# Patient Record
Sex: Female | Born: 1935
Health system: Southern US, Community
[De-identification: ages and names within clinical notes are randomized; demographics above are authoritative.]

## PROBLEM LIST (undated history)

## (undated) DIAGNOSIS — Z8719 Personal history of other diseases of the digestive system: Secondary | ICD-10-CM

## (undated) DIAGNOSIS — F419 Anxiety disorder, unspecified: Secondary | ICD-10-CM

## (undated) DIAGNOSIS — F329 Major depressive disorder, single episode, unspecified: Secondary | ICD-10-CM

## (undated) DIAGNOSIS — R0600 Dyspnea, unspecified: Secondary | ICD-10-CM

## (undated) DIAGNOSIS — Z8619 Personal history of other infectious and parasitic diseases: Secondary | ICD-10-CM

## (undated) DIAGNOSIS — IMO0002 Reserved for concepts with insufficient information to code with codable children: Secondary | ICD-10-CM

## (undated) DIAGNOSIS — I4891 Unspecified atrial fibrillation: Secondary | ICD-10-CM

## (undated) DIAGNOSIS — A498 Other bacterial infections of unspecified site: Secondary | ICD-10-CM

## (undated) DIAGNOSIS — G4730 Sleep apnea, unspecified: Secondary | ICD-10-CM

## (undated) DIAGNOSIS — N183 Chronic kidney disease, stage 3 (moderate): Secondary | ICD-10-CM

## (undated) DIAGNOSIS — T7840XA Allergy, unspecified, initial encounter: Secondary | ICD-10-CM

## (undated) DIAGNOSIS — K6389 Other specified diseases of intestine: Secondary | ICD-10-CM

## (undated) DIAGNOSIS — I499 Cardiac arrhythmia, unspecified: Secondary | ICD-10-CM

## (undated) DIAGNOSIS — I10 Essential (primary) hypertension: Secondary | ICD-10-CM

## (undated) DIAGNOSIS — J189 Pneumonia, unspecified organism: Secondary | ICD-10-CM

## (undated) DIAGNOSIS — E785 Hyperlipidemia, unspecified: Secondary | ICD-10-CM

## (undated) DIAGNOSIS — H409 Unspecified glaucoma: Secondary | ICD-10-CM

## (undated) DIAGNOSIS — I6529 Occlusion and stenosis of unspecified carotid artery: Secondary | ICD-10-CM

## (undated) DIAGNOSIS — K5790 Diverticulosis of intestine, part unspecified, without perforation or abscess without bleeding: Secondary | ICD-10-CM

## (undated) DIAGNOSIS — S02401A Maxillary fracture, unspecified, initial encounter for closed fracture: Secondary | ICD-10-CM

## (undated) DIAGNOSIS — K219 Gastro-esophageal reflux disease without esophagitis: Secondary | ICD-10-CM

## (undated) DIAGNOSIS — K589 Irritable bowel syndrome without diarrhea: Secondary | ICD-10-CM

## (undated) DIAGNOSIS — C541 Malignant neoplasm of endometrium: Secondary | ICD-10-CM

## (undated) DIAGNOSIS — F32A Depression, unspecified: Secondary | ICD-10-CM

## (undated) DIAGNOSIS — Z8489 Family history of other specified conditions: Secondary | ICD-10-CM

## (undated) DIAGNOSIS — R011 Cardiac murmur, unspecified: Secondary | ICD-10-CM

## (undated) DIAGNOSIS — E039 Hypothyroidism, unspecified: Secondary | ICD-10-CM

## (undated) DIAGNOSIS — C55 Malignant neoplasm of uterus, part unspecified: Secondary | ICD-10-CM

## (undated) DIAGNOSIS — M1120 Other chondrocalcinosis, unspecified site: Secondary | ICD-10-CM

## (undated) DIAGNOSIS — M1990 Unspecified osteoarthritis, unspecified site: Secondary | ICD-10-CM

## (undated) HISTORY — DX: Major depressive disorder, single episode, unspecified: F32.9

## (undated) HISTORY — DX: Depression, unspecified: F32.A

## (undated) HISTORY — PX: OTHER SURGICAL HISTORY: SHX169

## (undated) HISTORY — DX: Maxillary fracture, unspecified side, initial encounter for closed fracture: S02.401A

## (undated) HISTORY — DX: Occlusion and stenosis of unspecified carotid artery: I65.29

## (undated) HISTORY — PX: DG KNEE 1-2 VIEWS BILAT: HXRAD248

## (undated) HISTORY — DX: Unspecified glaucoma: H40.9

## (undated) HISTORY — DX: Diverticulosis of intestine, part unspecified, without perforation or abscess without bleeding: K57.90

## (undated) HISTORY — PX: COLONOSCOPY: SHX174

## (undated) HISTORY — DX: Cardiac arrhythmia, unspecified: I49.9

## (undated) HISTORY — DX: Other specified diseases of intestine: K63.89

## (undated) HISTORY — DX: Anxiety disorder, unspecified: F41.9

## (undated) HISTORY — PX: ESOPHAGOGASTRODUODENOSCOPY: SHX1529

## (undated) HISTORY — DX: Reserved for concepts with insufficient information to code with codable children: IMO0002

## (undated) HISTORY — DX: Pneumonia, unspecified organism: J18.9

## (undated) HISTORY — DX: Irritable bowel syndrome without diarrhea: K58.9

## (undated) HISTORY — DX: Essential (primary) hypertension: I10

## (undated) HISTORY — DX: Other chondrocalcinosis, unspecified site: M11.20

## (undated) HISTORY — DX: Hyperlipidemia, unspecified: E78.5

## (undated) HISTORY — DX: Chronic kidney disease, stage 3 (moderate): N18.3

## (undated) HISTORY — DX: Allergy, unspecified, initial encounter: T78.40XA

## (undated) HISTORY — DX: Unspecified osteoarthritis, unspecified site: M19.90

## (undated) HISTORY — DX: Cardiac murmur, unspecified: R01.1

## (undated) HISTORY — DX: Personal history of other infectious and parasitic diseases: Z86.19

## (undated) HISTORY — PX: TONSILLECTOMY: SUR1361

## (undated) HISTORY — PX: JOINT REPLACEMENT: SHX530

## (undated) HISTORY — DX: Malignant neoplasm of uterus, part unspecified: C55

---

## 1997-04-26 HISTORY — PX: OTHER SURGICAL HISTORY: SHX169

## 1998-02-28 ENCOUNTER — Inpatient Hospital Stay (HOSPITAL_COMMUNITY): Admission: EM | Admit: 1998-02-28 | Discharge: 1998-03-03 | Payer: Self-pay | Admitting: Emergency Medicine

## 1998-02-28 ENCOUNTER — Encounter: Payer: Self-pay | Admitting: Emergency Medicine

## 1998-03-11 ENCOUNTER — Encounter: Payer: Self-pay | Admitting: Gastroenterology

## 1998-03-11 ENCOUNTER — Ambulatory Visit (HOSPITAL_COMMUNITY): Admission: RE | Admit: 1998-03-11 | Discharge: 1998-03-11 | Payer: Self-pay | Admitting: Gastroenterology

## 1999-11-05 ENCOUNTER — Other Ambulatory Visit: Admission: RE | Admit: 1999-11-05 | Discharge: 1999-11-05 | Payer: Self-pay | Admitting: Internal Medicine

## 1999-11-12 ENCOUNTER — Encounter: Admission: RE | Admit: 1999-11-12 | Discharge: 1999-11-12 | Payer: Self-pay | Admitting: Internal Medicine

## 1999-11-12 ENCOUNTER — Encounter: Payer: Self-pay | Admitting: Internal Medicine

## 2000-10-31 ENCOUNTER — Other Ambulatory Visit: Admission: RE | Admit: 2000-10-31 | Discharge: 2000-10-31 | Payer: Self-pay | Admitting: Internal Medicine

## 2000-11-30 ENCOUNTER — Encounter: Payer: Self-pay | Admitting: Internal Medicine

## 2000-11-30 ENCOUNTER — Encounter: Admission: RE | Admit: 2000-11-30 | Discharge: 2000-11-30 | Payer: Self-pay | Admitting: Internal Medicine

## 2001-11-06 ENCOUNTER — Other Ambulatory Visit: Admission: RE | Admit: 2001-11-06 | Discharge: 2001-11-06 | Payer: Self-pay | Admitting: Internal Medicine

## 2001-12-01 ENCOUNTER — Encounter: Admission: RE | Admit: 2001-12-01 | Discharge: 2001-12-01 | Payer: Self-pay | Admitting: Internal Medicine

## 2001-12-01 ENCOUNTER — Encounter: Payer: Self-pay | Admitting: Internal Medicine

## 2002-07-23 ENCOUNTER — Encounter: Payer: Self-pay | Admitting: Gastroenterology

## 2002-07-23 ENCOUNTER — Encounter: Admission: RE | Admit: 2002-07-23 | Discharge: 2002-07-23 | Payer: Self-pay | Admitting: Gastroenterology

## 2002-07-25 ENCOUNTER — Encounter: Payer: Self-pay | Admitting: Gastroenterology

## 2002-07-25 ENCOUNTER — Encounter: Admission: RE | Admit: 2002-07-25 | Discharge: 2002-07-25 | Payer: Self-pay | Admitting: Gastroenterology

## 2003-01-01 ENCOUNTER — Encounter: Admission: RE | Admit: 2003-01-01 | Discharge: 2003-01-01 | Payer: Self-pay | Admitting: Family Medicine

## 2003-01-01 ENCOUNTER — Encounter: Payer: Self-pay | Admitting: Family Medicine

## 2003-04-30 ENCOUNTER — Encounter: Admission: RE | Admit: 2003-04-30 | Discharge: 2003-04-30 | Payer: Self-pay | Admitting: Family Medicine

## 2004-03-03 ENCOUNTER — Encounter: Admission: RE | Admit: 2004-03-03 | Discharge: 2004-03-03 | Payer: Self-pay | Admitting: Family Medicine

## 2004-03-27 ENCOUNTER — Ambulatory Visit: Payer: Self-pay | Admitting: Family Medicine

## 2004-07-14 ENCOUNTER — Ambulatory Visit: Payer: Self-pay | Admitting: Family Medicine

## 2004-09-25 ENCOUNTER — Ambulatory Visit: Payer: Self-pay | Admitting: Internal Medicine

## 2004-10-06 ENCOUNTER — Ambulatory Visit: Payer: Self-pay | Admitting: Family Medicine

## 2004-12-30 ENCOUNTER — Ambulatory Visit: Payer: Self-pay | Admitting: Family Medicine

## 2005-04-13 ENCOUNTER — Ambulatory Visit: Payer: Self-pay | Admitting: Family Medicine

## 2005-04-13 ENCOUNTER — Other Ambulatory Visit: Admission: RE | Admit: 2005-04-13 | Discharge: 2005-04-13 | Payer: Self-pay | Admitting: Family Medicine

## 2005-04-13 ENCOUNTER — Encounter: Payer: Self-pay | Admitting: Family Medicine

## 2005-04-13 LAB — CONVERTED CEMR LAB: Pap Smear: NORMAL

## 2005-04-27 ENCOUNTER — Encounter: Admission: RE | Admit: 2005-04-27 | Discharge: 2005-04-27 | Payer: Self-pay | Admitting: Family Medicine

## 2005-05-20 ENCOUNTER — Ambulatory Visit: Payer: Self-pay | Admitting: Family Medicine

## 2005-06-15 ENCOUNTER — Ambulatory Visit: Payer: Self-pay | Admitting: Family Medicine

## 2005-07-05 ENCOUNTER — Ambulatory Visit: Payer: Self-pay | Admitting: Family Medicine

## 2005-09-03 ENCOUNTER — Ambulatory Visit: Payer: Self-pay | Admitting: Family Medicine

## 2005-11-03 ENCOUNTER — Ambulatory Visit: Payer: Self-pay | Admitting: Family Medicine

## 2005-11-16 ENCOUNTER — Ambulatory Visit: Payer: Self-pay | Admitting: Family Medicine

## 2006-04-12 ENCOUNTER — Ambulatory Visit: Payer: Self-pay | Admitting: Family Medicine

## 2006-04-29 ENCOUNTER — Encounter: Admission: RE | Admit: 2006-04-29 | Discharge: 2006-04-29 | Payer: Self-pay | Admitting: Family Medicine

## 2006-06-06 ENCOUNTER — Ambulatory Visit: Payer: Self-pay | Admitting: Family Medicine

## 2006-06-06 LAB — CONVERTED CEMR LAB
ALT: 19 units/L (ref 0–40)
Albumin: 3.7 g/dL (ref 3.5–5.2)
BUN: 12 mg/dL (ref 6–23)
Cholesterol: 224 mg/dL (ref 0–200)
Creatinine, Ser: 0.8 mg/dL (ref 0.4–1.2)
GFR calc Af Amer: 90 mL/min
GFR calc non Af Amer: 75 mL/min
HDL: 67.2 mg/dL (ref >39.0)
Phosphorus: 3.2 mg/dL (ref 2.3–4.6)
Potassium: 4.3 meq/L (ref 3.5–5.1)
Sodium: 139 meq/L (ref 135–145)
Total CHOL/HDL Ratio: 3.3
Triglycerides: 139 mg/dL (ref 0–149)
VLDL: 28 mg/dL (ref 0–40)

## 2006-07-08 ENCOUNTER — Ambulatory Visit: Payer: Self-pay | Admitting: Family Medicine

## 2006-07-08 LAB — CONVERTED CEMR LAB: Calcium: 9.2 mg/dL (ref 8.4–10.5)

## 2006-07-26 HISTORY — PX: OTHER SURGICAL HISTORY: SHX169

## 2006-09-14 ENCOUNTER — Telehealth (INDEPENDENT_AMBULATORY_CARE_PROVIDER_SITE_OTHER): Payer: Self-pay | Admitting: *Deleted

## 2006-10-20 ENCOUNTER — Ambulatory Visit: Payer: Self-pay | Admitting: Internal Medicine

## 2006-10-20 DIAGNOSIS — M1990 Unspecified osteoarthritis, unspecified site: Secondary | ICD-10-CM

## 2006-11-02 ENCOUNTER — Encounter: Payer: Self-pay | Admitting: Family Medicine

## 2006-11-02 DIAGNOSIS — K581 Irritable bowel syndrome with constipation: Secondary | ICD-10-CM

## 2006-11-02 DIAGNOSIS — K649 Unspecified hemorrhoids: Secondary | ICD-10-CM | POA: Insufficient documentation

## 2006-11-02 DIAGNOSIS — M810 Age-related osteoporosis without current pathological fracture: Secondary | ICD-10-CM | POA: Insufficient documentation

## 2006-11-02 DIAGNOSIS — N6459 Other signs and symptoms in breast: Secondary | ICD-10-CM

## 2006-11-02 DIAGNOSIS — K648 Other hemorrhoids: Secondary | ICD-10-CM

## 2006-11-02 DIAGNOSIS — K589 Irritable bowel syndrome without diarrhea: Secondary | ICD-10-CM

## 2006-11-02 DIAGNOSIS — E785 Hyperlipidemia, unspecified: Secondary | ICD-10-CM

## 2006-11-02 DIAGNOSIS — K5909 Other constipation: Secondary | ICD-10-CM | POA: Insufficient documentation

## 2006-11-02 DIAGNOSIS — H409 Unspecified glaucoma: Secondary | ICD-10-CM

## 2006-11-02 DIAGNOSIS — H401190 Primary open-angle glaucoma, unspecified eye, stage unspecified: Secondary | ICD-10-CM | POA: Insufficient documentation

## 2006-11-02 DIAGNOSIS — E039 Hypothyroidism, unspecified: Secondary | ICD-10-CM

## 2006-11-02 DIAGNOSIS — T7840XA Allergy, unspecified, initial encounter: Secondary | ICD-10-CM | POA: Insufficient documentation

## 2006-11-02 HISTORY — DX: Irritable bowel syndrome, unspecified: K58.9

## 2006-11-03 ENCOUNTER — Ambulatory Visit: Payer: Self-pay | Admitting: Family Medicine

## 2007-02-24 ENCOUNTER — Ambulatory Visit: Payer: Self-pay | Admitting: Family Medicine

## 2007-02-24 DIAGNOSIS — I10 Essential (primary) hypertension: Secondary | ICD-10-CM | POA: Insufficient documentation

## 2007-03-13 ENCOUNTER — Ambulatory Visit: Payer: Self-pay | Admitting: Family Medicine

## 2007-03-14 LAB — CONVERTED CEMR LAB
ALT: 15 units/L (ref 0–35)
Albumin: 3.8 g/dL (ref 3.5–5.2)
CO2: 30 meq/L (ref 19–32)
Chloride: 99 meq/L (ref 96–112)
Creatinine, Ser: 1.1 mg/dL (ref 0.4–1.2)
Direct LDL: 137.6 mg/dL
GFR calc non Af Amer: 52 mL/min
HDL: 59.9 mg/dL (ref >39.0)
Sodium: 139 meq/L (ref 135–145)

## 2007-03-22 ENCOUNTER — Ambulatory Visit: Payer: Self-pay | Admitting: Cardiology

## 2007-05-16 ENCOUNTER — Telehealth: Payer: Self-pay | Admitting: Family Medicine

## 2007-05-17 ENCOUNTER — Ambulatory Visit: Payer: Self-pay | Admitting: Family Medicine

## 2007-05-25 ENCOUNTER — Encounter: Admission: RE | Admit: 2007-05-25 | Discharge: 2007-05-25 | Payer: Self-pay | Admitting: Family Medicine

## 2007-05-29 ENCOUNTER — Encounter: Payer: Self-pay | Admitting: Family Medicine

## 2007-05-29 ENCOUNTER — Encounter (INDEPENDENT_AMBULATORY_CARE_PROVIDER_SITE_OTHER): Payer: Self-pay | Admitting: *Deleted

## 2007-07-14 ENCOUNTER — Telehealth: Payer: Self-pay | Admitting: Family Medicine

## 2007-07-18 ENCOUNTER — Ambulatory Visit: Payer: Self-pay | Admitting: Family Medicine

## 2007-07-19 LAB — CONVERTED CEMR LAB
Basophils Absolute: 0 10*3/uL (ref 0.0–0.1)
Basophils Relative: 0.5 % (ref 0.0–1.0)
Eosinophils Absolute: 0.1 10*3/uL (ref 0.0–0.6)
Eosinophils Relative: 2.3 % (ref 0.0–5.0)
HCT: 42.5 % (ref 36.0–46.0)
Hemoglobin: 14 g/dL (ref 12.0–15.0)
Lymphocytes Relative: 34 % (ref 12.0–46.0)
MCHC: 33 g/dL (ref 30.0–36.0)
MCV: 92.1 fL (ref 78.0–100.0)
Monocytes Absolute: 0.7 10*3/uL (ref 0.2–0.7)
Monocytes Relative: 11.9 % — ABNORMAL HIGH (ref 3.0–11.0)
Neutro Abs: 3.4 10*3/uL (ref 1.4–7.7)
Neutrophils Relative %: 51.3 % (ref 43.0–77.0)
Platelets: 271 10*3/uL (ref 150–400)
RBC: 4.61 M/uL (ref 3.87–5.11)
RDW: 13.1 % (ref 11.5–14.6)
TSH: 1.11 microintl units/mL (ref 0.35–5.50)
WBC: 6.3 10*3/uL (ref 4.5–10.5)

## 2007-07-20 ENCOUNTER — Encounter: Payer: Self-pay | Admitting: Family Medicine

## 2007-09-12 ENCOUNTER — Telehealth: Payer: Self-pay | Admitting: Family Medicine

## 2008-01-03 ENCOUNTER — Ambulatory Visit: Payer: Self-pay | Admitting: Family Medicine

## 2008-01-15 ENCOUNTER — Telehealth (INDEPENDENT_AMBULATORY_CARE_PROVIDER_SITE_OTHER): Payer: Self-pay | Admitting: *Deleted

## 2008-02-16 ENCOUNTER — Ambulatory Visit: Payer: Self-pay | Admitting: Family Medicine

## 2008-02-16 DIAGNOSIS — R609 Edema, unspecified: Secondary | ICD-10-CM

## 2008-02-19 ENCOUNTER — Encounter: Admission: RE | Admit: 2008-02-19 | Discharge: 2008-02-19 | Payer: Self-pay | Admitting: Family Medicine

## 2008-02-29 ENCOUNTER — Ambulatory Visit: Payer: Self-pay | Admitting: Family Medicine

## 2008-03-04 LAB — CONVERTED CEMR LAB
ALT: 17 units/L (ref 0–35)
AST: 25 units/L (ref 0–37)
BUN: 17 mg/dL (ref 6–23)
Bilirubin, Direct: 0.2 mg/dL (ref 0.0–0.3)
Cholesterol: 208 mg/dL (ref 0–200)
Glucose, Bld: 104 mg/dL — ABNORMAL HIGH (ref 70–99)
Lymphocytes Relative: 31.7 % (ref 12.0–46.0)
Monocytes Relative: 12.4 % — ABNORMAL HIGH (ref 3.0–12.0)
Neutrophils Relative %: 52.1 % (ref 43.0–77.0)
Phosphorus: 3.4 mg/dL (ref 2.3–4.6)
Platelets: 242 10*3/uL (ref 150–400)
Potassium: 4.5 meq/L (ref 3.5–5.1)
RDW: 12.6 % (ref 11.5–14.6)
TSH: 2.76 microintl units/mL (ref 0.35–5.50)
Total Bilirubin: 0.8 mg/dL (ref 0.3–1.2)
Total CHOL/HDL Ratio: 3.7
Triglycerides: 102 mg/dL (ref 0–149)
VLDL: 20 mg/dL (ref 0–40)

## 2008-03-11 ENCOUNTER — Telehealth: Payer: Self-pay | Admitting: Family Medicine

## 2008-04-02 ENCOUNTER — Ambulatory Visit: Payer: Self-pay | Admitting: Family Medicine

## 2008-05-21 ENCOUNTER — Ambulatory Visit: Payer: Self-pay | Admitting: Family Medicine

## 2008-05-24 ENCOUNTER — Ambulatory Visit: Payer: Self-pay | Admitting: Family Medicine

## 2008-05-29 ENCOUNTER — Encounter: Admission: RE | Admit: 2008-05-29 | Discharge: 2008-05-29 | Payer: Self-pay | Admitting: Family Medicine

## 2008-05-31 ENCOUNTER — Encounter (INDEPENDENT_AMBULATORY_CARE_PROVIDER_SITE_OTHER): Payer: Self-pay | Admitting: *Deleted

## 2008-07-16 ENCOUNTER — Encounter: Payer: Self-pay | Admitting: Family Medicine

## 2008-10-07 ENCOUNTER — Telehealth: Payer: Self-pay | Admitting: Family Medicine

## 2008-10-09 ENCOUNTER — Ambulatory Visit: Payer: Self-pay | Admitting: Family Medicine

## 2008-10-11 LAB — CONVERTED CEMR LAB
Albumin: 3.8 g/dL (ref 3.5–5.2)
BUN: 13 mg/dL (ref 6–23)
Creatinine, Ser: 1.1 mg/dL (ref 0.4–1.2)
Direct LDL: 134.3 mg/dL
Glucose, Bld: 105 mg/dL — ABNORMAL HIGH (ref 70–99)
HDL: 68.7 mg/dL (ref >39.00)
Phosphorus: 3.6 mg/dL (ref 2.3–4.6)
Potassium: 4 meq/L (ref 3.5–5.1)
VLDL: 22.8 mg/dL (ref 0.0–40.0)

## 2008-10-22 ENCOUNTER — Ambulatory Visit: Payer: Self-pay | Admitting: Family Medicine

## 2008-10-23 ENCOUNTER — Telehealth: Payer: Self-pay | Admitting: Family Medicine

## 2009-01-23 ENCOUNTER — Ambulatory Visit: Payer: Self-pay | Admitting: Family Medicine

## 2009-01-24 ENCOUNTER — Encounter (INDEPENDENT_AMBULATORY_CARE_PROVIDER_SITE_OTHER): Payer: Self-pay | Admitting: *Deleted

## 2009-01-24 LAB — CONVERTED CEMR LAB
ALT: 17 units/L (ref 0–35)
AST: 27 units/L (ref 0–37)
TSH: 2.95 microintl units/mL (ref 0.35–5.50)
Total CHOL/HDL Ratio: 4
VLDL: 28.8 mg/dL (ref 0.0–40.0)

## 2009-03-07 ENCOUNTER — Ambulatory Visit: Payer: Self-pay | Admitting: Family Medicine

## 2009-03-10 LAB — CONVERTED CEMR LAB
ALT: 17 units/L (ref 0–35)
AST: 26 units/L (ref 0–37)
Total CHOL/HDL Ratio: 2

## 2009-04-26 HISTORY — PX: OTHER SURGICAL HISTORY: SHX169

## 2009-05-14 ENCOUNTER — Telehealth: Payer: Self-pay | Admitting: Family Medicine

## 2009-06-30 ENCOUNTER — Encounter: Admission: RE | Admit: 2009-06-30 | Discharge: 2009-06-30 | Payer: Self-pay | Admitting: Family Medicine

## 2009-07-02 ENCOUNTER — Encounter (INDEPENDENT_AMBULATORY_CARE_PROVIDER_SITE_OTHER): Payer: Self-pay | Admitting: *Deleted

## 2009-09-19 ENCOUNTER — Ambulatory Visit: Payer: Self-pay | Admitting: Family Medicine

## 2009-09-19 LAB — CONVERTED CEMR LAB
ALT: 21 units/L (ref 0–35)
BUN: 15 mg/dL (ref 6–23)
Basophils Relative: 0.5 % (ref 0.0–3.0)
Bilirubin, Direct: 0.2 mg/dL (ref 0.0–0.3)
CO2: 35 meq/L — ABNORMAL HIGH (ref 19–32)
Chloride: 101 meq/L (ref 96–112)
Cholesterol: 180 mg/dL (ref 0–200)
Creatinine, Ser: 1 mg/dL (ref 0.4–1.2)
Eosinophils Absolute: 0.2 10*3/uL (ref 0.0–0.7)
HCT: 43.3 % (ref 36.0–46.0)
Lymphs Abs: 2.1 10*3/uL (ref 0.7–4.0)
MCHC: 34.5 g/dL (ref 30.0–36.0)
MCV: 92.4 fL (ref 78.0–100.0)
Monocytes Absolute: 0.8 10*3/uL (ref 0.1–1.0)
Neutrophils Relative %: 62.6 % (ref 43.0–77.0)
Platelets: 310 10*3/uL (ref 150.0–400.0)
Potassium: 3.9 meq/L (ref 3.5–5.1)
TSH: 2.69 microintl units/mL (ref 0.35–5.50)
Total Protein: 6.6 g/dL (ref 6.0–8.3)
Triglycerides: 87 mg/dL (ref 0.0–149.0)

## 2009-09-21 LAB — CONVERTED CEMR LAB: Vit D, 25-Hydroxy: 42 ng/mL (ref 30–89)

## 2009-10-03 ENCOUNTER — Ambulatory Visit: Payer: Self-pay | Admitting: Family Medicine

## 2009-10-28 ENCOUNTER — Encounter: Payer: Self-pay | Admitting: Family Medicine

## 2009-11-04 ENCOUNTER — Ambulatory Visit: Payer: Self-pay | Admitting: Family Medicine

## 2009-12-01 ENCOUNTER — Telehealth: Payer: Self-pay | Admitting: Family Medicine

## 2009-12-05 ENCOUNTER — Telehealth: Payer: Self-pay | Admitting: Family Medicine

## 2010-01-14 ENCOUNTER — Telehealth: Payer: Self-pay | Admitting: Family Medicine

## 2010-03-04 ENCOUNTER — Ambulatory Visit: Payer: Self-pay | Admitting: Family Medicine

## 2010-04-26 HISTORY — PX: OTHER SURGICAL HISTORY: SHX169

## 2010-05-11 ENCOUNTER — Ambulatory Visit
Admission: RE | Admit: 2010-05-11 | Discharge: 2010-05-11 | Payer: Self-pay | Source: Home / Self Care | Attending: Family Medicine | Admitting: Family Medicine

## 2010-05-24 LAB — CONVERTED CEMR LAB
Sed Rate: 16 mm/hr (ref 0–25)
TSH: 3.54 microintl units/mL (ref 0.35–5.50)

## 2010-05-26 NOTE — Progress Notes (Signed)
Summary: Rx Chlordiazep  Phone Note Refill Request Call back at [phone] Message from:  [facility] on January 14, 2010 2:58 PM  Refills Requested: Medication #1:  LIBRIUM 10 MG CAPS take one by mouth two times a day   Last Refilled: 12/05/2009 Received E-script request please advise.   Method Requested: Telephone to Pharmacy Initial call taken by: Linde Gillis CMA Duncan Dull),  January 14, 2010 2:59 PM  Follow-up for Phone Call        px written on EMR for call in  Follow-up by: Judith Part MD,  January 14, 2010 3:01 PM  Additional Follow-up for Phone Call Additional follow up Details #1::        Rx called to pharmacy Additional Follow-up by: Linde Gillis CMA Duncan Dull),  January 14, 2010 3:20 PM    New/Updated Medications: LIBRIUM 10 MG CAPS (CHLORDIAZEPOXIDE HCL) take one by mouth two times a day  ++Prescriptions: LIBRIUM 10 MG CAPS (CHLORDIAZEPOXIDE HCL) take one by mouth two times a day  #60 x 3   Entered and Authorized by:   Judith Part MD   Signed by:   Linde Gillis CMA (AAMA) on 01/14/2010   Method used:   Telephoned to ...       [facility] [address] (retail)       [address]       [place], [place]  [address]       Ph: [phone]       Fax: [phone]   RxID:   [id number]

## 2010-05-26 NOTE — Assessment & Plan Note (Signed)
Summary: ? SINUS INFECTION   Vital Signs:  Patient profile:   77 year old female Height:      61.5 inches Weight:      161.50 pounds BMI:     30.13 Temp:     98 degrees F oral Pulse rate:   80 / minute Pulse rhythm:   regular BP sitting:   128 / 74  (left arm) Cuff size:   regular  Vitals Entered By: Lewanda Rife LPN (March 04, 2010 12:35 PM) CC: ?sinus infection, when blows nose has green mucus, drainage at back of throat   History of Present Illness: thinks she has a sinus infection started as scratchy sore throat -- 6 days ago  then turned into congestion and purulent nasal d/c  face really hurts- taking tylenol is hoarse  is getting worse instead of better  a little cough - not a lot    wants shingles shot when she gets better  had shingles once 6 years ago  Allergies: 1)  ! Fosamax (Alendronate Sodium) 2)  ! * Actonel 3)  ! Ace Inhibitors 4)  * Sulfa (Sulfonamides) Group 5)  Miralax (Polyethylene Glycol 3350) 6)  Zocor  Past History:  Past Medical History: Last updated: 05/24/2008 Osteoarthritis Anxiety Depression Hyperlipidemia Hypothyroidism Osteoporosis shingles in past Bacterial overgrowth of small colon HTN  degenerative disc dz- LS   Past Surgical History: Last updated: 02/20/2008 Tonsillectomy Bowel obstruction (1999) Colonoscopy- not finished (1999) Dexa- OP (1997-1998) Diverticulosis Dexa- OP, borderline, spine -2.44T (12/2002) Colonoscopy- slight hemorrhage rectosigmoid area (02/2003) EGD- neg Dexa- decreased BMD-OP (06/2006) Hemorrhoid procedure-(07/2006) LS x ray with degenerative disc and facet change  Family History: Last updated: 04/02/2008 Father:  Mother:  Siblings: brother deceased age79- DM, CAD sister with vasc dz/ carotid  Social History: Last updated: 01/03/2008 Marital Status: Married Children: 2 Occupation: retired non smoker   Risk Factors: Smoking Status: never (11/02/2006)  Review of  Systems General:  Complains of chills, fatigue, fever, loss of appetite, and malaise. Eyes:  Denies blurring and eye irritation. ENT:  Complains of hoarseness, nasal congestion, postnasal drainage, sinus pressure, and sore throat. CV:  Denies chest pain or discomfort and palpitations. Resp:  Complains of cough; denies pleuritic and wheezing. GI:  Denies diarrhea, nausea, and vomiting. Derm:  Denies itching, lesion(s), poor wound healing, and rash.  Physical Exam  General:  Well-developed,well-nourished,in no acute distress; alert,appropriate and cooperative throughout examination Head:  normocephalic, atraumatic, and no abnormalities observed.  tender maxillary sinuses worse on the L  Eyes:  vision grossly intact, pupils equal, pupils round, pupils reactive to light, and no injection.   Ears:  R ear normal and L ear normal.   Nose:  nares are injected and congested bilaterally  Mouth:  pharynx pink and moist, no erythema, and no exudates.  some post nasal drip noted  Neck:  supple with full rom and no masses or thyromegally, no JVD or carotid bruit  Lungs:  Normal respiratory effort, chest expands symmetrically. Lungs are clear to auscultation, no crackles or wheezes. Heart:  Normal rate and regular rhythm. S1 and S2 normal without gallop, murmur, click, rub or other extra sounds. Skin:  Intact without suspicious lesions or rashes Cervical Nodes:  No lymphadenopathy noted Psych:  normal affect, talkative and pleasant    Impression & Recommendations:  Problem # 1:  SINUSITIS - ACUTE-NOS (ICD-461.9) Assessment New  with facial pain after a bout of uri and allergies / with purulent nasal discharge will tx with  augmentin continue nasal saline irrigations mucinex ok too  pt advised to update me if symptoms worsen or do not improve  Her updated medication list for this problem includes:    Astelin 137 Mcg/spray Soln (Azelastine hcl) ..... Use as directed as needed    Augmentin 875-125  Mg Tabs (Amoxicillin-pot clavulanate) .Marland Kitchen... 1 by mouth two times a day for 10 days for sinus infection  Orders: Prescription Created Electronically [id number])  Problem # 2:  Preventive Health Care (ICD-V70.0) Assessment: Comment Only pt will get zoster vaccine when totally over this -given px  Complete Medication List: 1)  Levoxyl 75 Mcg Tabs (Levothyroxine sodium) .... Take 1 tablet by mouth once a day 2)  Librium 10 Mg Caps (Chlordiazepoxide hcl) .... Take one by mouth two times a day 3)  Imipramine Hcl 25 Mg Tabs (Imipramine hcl) .... Take 1 tablet by mouth twice a day 4)  Astelin 137 Mcg/spray Soln (Azelastine hcl) .... Use as directed as needed 5)  Allegra 180 Mg Tabs (Fexofenadine hcl) .... Take one by mouth daily as needed 6)  Flaxseed Oil 1000 Mg Caps (Flaxseed (linseed)) .... Take 1 tablet by mouth once a day 7)  Garlic 400 Mg Tbec (Garlic) .... Take 1 tablet by mouth once a day 8)  Fish Oil Oil (Fish oil) .... Take 1 tablet by mouth once a day 9)  Hydrochlorothiazide 25 Mg Tabs (Hydrochlorothiazide) .... Take 1 by mouth once daily 10)  Zocor 20 Mg Tabs (Simvastatin) .... Take 1/2 tab by mouth at bedtime 11)  Travatan Z 0.004 % Soln (Travoprost) .... One drop in each eye at bedtime 12)  Cozaar 50 Mg Tabs (Losartan potassium) .Marland Kitchen.. 1 by mouth once daily in am 13)  Lumigan 0.01 % Soln (Bimatoprost) .... One drop both eyes at night 14)  Metamucil 30.9 % Powd (Psyllium) .... Otc as directed. 15)  Miralax Powd (Polyethylene glycol 3350) .... Otc as directed. 16)  Netty Pot  .... Otc as directed. 17)  Augmentin 875-125 Mg Tabs (Amoxicillin-pot clavulanate) .Marland Kitchen.. 1 by mouth two times a day for 10 days for sinus infection 18)  Zostavax 98119 Unt/0.50ml Solr (Zoster vaccine live) .... Inject times one as directed  Patient Instructions: 1)  continue the nasal salt water irrigation -- as often as you can  2)  drink lots of fluids  3)  you can take mucinex as needed for congestion also  4)   update me if not improving next week  Prescriptions: ZOSTAVAX 14782 UNT/0.65ML SOLR (ZOSTER VACCINE LIVE) inject times one as directed  #1 x 0   Entered and Authorized by:   Judith Part MD   Signed by:   Judith Part MD on 03/04/2010   Method used:   Print then Give to Patient   RxID:   [id number] AUGMENTIN 875-125 MG TABS (AMOXICILLIN-POT CLAVULANATE) 1 by mouth two times a day for 10 days for sinus infection  #20 x 0   Entered and Authorized by:   Judith Part MD   Signed by:   Judith Part MD on 03/04/2010   Method used:   Electronically to        [facility] [address] (retail)       [address]       [place], [place]  [address]       Ph: [phone]       Fax: [phone]   RxID:  [id number]    Orders Added: 1)  Prescription Created Electronically [G8553] 2)  Est. Patient Level III [[address]]    Current Allergies (reviewed today): ! FOSAMAX (ALENDRONATE SODIUM) ! * ACTONEL ! ACE INHIBITORS * SULFA (SULFONAMIDES) GROUP MIRALAX (POLYETHYLENE GLYCOL 3350) ZOCOR

## 2010-05-26 NOTE — Assessment & Plan Note (Signed)
Summary: CPX/DLO   Vital Signs:  Patient profile:   77 year old female Height:      61.5 inches Weight:      156.75 pounds BMI:     29.24 Temp:     98.1 degrees F oral Pulse rate:   76 / minute Pulse rhythm:   regular BP sitting:   170 / 96  (left arm) Cuff size:   regular  Vitals Entered By: Lewanda Rife LPN (October 03, 2009 10:25 AM)  Serial Vital Signs/Assessments:  Time      Position  BP       Pulse  Resp  Temp     By                     170/90                         Judith Part MD  CC: CPX LMP 1987   History of Present Illness: here for check up of her chronic medical problems and to [facility] mt list   wt is stable at bmi of 29  bp high today 170/96 feels anxious - does not know why  just took med before she left   feels so tired -- ? if has to do with age  no exercise  does not sleep good at all    lipids Last Lipid ProfileCholesterol: 180 (09/19/2009 11:25:00 AM)HDL:  88.40 (09/19/2009 11:25:00 AM)LDL:  74 (09/19/2009 11:25:00 AM)Triglycerides:  Last Liver profileSGOT:  30 (09/19/2009 11:25:00 AM)SPGT:  21 (09/19/2009 11:25:00 AM)T. Bili:  0.6 (09/19/2009 11:25:00 AM)Alk Phos:  90 (09/19/2009 11:25:00 AM)  overall in good control with zocor and diet   thyroid- stable tsh , no changes   OP - last dexa 3/08 has lost some ht  is taking her ca and vit D   colonosc 04- had slt hemorrhage chronic problems with GI symptoms  takes metamucil every day   pap nl in 06 no gyn problems    3/11 mam self exam - no lumps or changes   Td 2010 ptx 04 shingles status     Allergies: 1)  ! Fosamax (Alendronate Sodium) 2)  ! * Actonel 3)  ! Ace Inhibitors 4)  * Sulfa (Sulfonamides) Group 5)  Miralax (Polyethylene Glycol 3350) 6)  Zocor  Past History:  Past Medical History: Last updated: 05/24/2008 Osteoarthritis Anxiety Depression Hyperlipidemia Hypothyroidism Osteoporosis shingles in past Bacterial overgrowth of small colon HTN  degenerative  disc dz- LS   Past Surgical History: Last updated: 02/20/2008 Tonsillectomy Bowel obstruction (1999) Colonoscopy- not finished (1999) Dexa- OP (1997-1998) Diverticulosis Dexa- OP, borderline, spine -2.44T (12/2002) Colonoscopy- slight hemorrhage rectosigmoid area (02/2003) EGD- neg Dexa- decreased BMD-OP (06/2006) Hemorrhoid procedure-(07/2006) LS x ray with degenerative disc and facet change  Family History: Last updated: 04/02/2008 Father:  Mother:  Siblings: brother deceased age79- DM, CAD sister with vasc dz/ carotid  Social History: Last updated: 01/03/2008 Marital Status: Married Children: 2 Occupation: retired non smoker   Risk Factors: Smoking Status: never (11/02/2006)  Review of Systems General:  Denies fatigue, fever, loss of appetite, and malaise. Eyes:  Denies blurring and eye irritation. ENT:  Denies sore throat. CV:  Denies chest pain or discomfort, lightheadness, palpitations, and shortness of breath with exertion. Resp:  Denies cough, shortness of breath, and wheezing. GI:  Complains of constipation and diarrhea; denies abdominal pain, change in bowel habits, dark tarry stools, loss of appetite,  nausea, and vomiting. GU:  Denies dysuria and urinary frequency. MS:  Denies joint pain, joint redness, joint swelling, and cramps. Derm:  Denies itching, lesion(s), poor wound healing, and rash. Neuro:  Denies headaches, numbness, and tingling. Psych:  mood has been ok - just anxious today. Endo:  Denies cold intolerance, excessive thirst, excessive urination, and heat intolerance. Heme:  Denies abnormal bruising and bleeding.  Physical Exam  General:  overweight but generally well appearing  Head:  normocephalic, atraumatic, and no abnormalities observed.   Eyes:  vision grossly intact, pupils equal, pupils round, and pupils reactive to light.  no conjunctival pallor, injection or icterus  Ears:  R ear normal and L ear normal.   Nose:  no nasal  discharge.   Mouth:  pharynx pink and moist.   Neck:  supple with full rom and no masses or thyromegally, no JVD or carotid bruit  Chest Wall:  No deformities, masses, or tenderness noted. Breasts:  No mass, nodules, thickening, tenderness, bulging, retraction, inflamation, nipple discharge or skin changes noted.   Lungs:  Normal respiratory effort, chest expands symmetrically. Lungs are clear to auscultation, no crackles or wheezes. Heart:  Normal rate and regular rhythm. S1 and S2 normal without gallop, murmur, click, rub or other extra sounds. Abdomen:  Bowel sounds positive,abdomen soft and non-tender without masses, organomegaly or hernias noted. no renal bruits  Msk:  No deformity or scoliosis noted of thoracic or lumbar spine.  no acute joint changes  Pulses:  R and L carotid,radial,femoral,dorsalis pedis and posterior tibial pulses are full and equal bilaterally varicosities noted  Extremities:  No clubbing, cyanosis, edema, or deformity noted with normal full range of motion of all joints.   Neurologic:  sensation intact to light touch, gait normal, and DTRs symmetrical and normal.   Skin:  Intact without suspicious lesions or rashes Cervical Nodes:  No lymphadenopathy noted Axillary Nodes:  No palpable lymphadenopathy Inguinal Nodes:  No significant adenopathy Psych:  seems mildly anxious today but pleasant  good eye contact and communication skills    Impression & Recommendations:  Problem # 1:  HYPERTENSION, BENIGN (ICD-401.1) Assessment Deteriorated  bp up significantly add cozaar (has hx of ace cough) update if side eff lifestyle changes disc f/u 1 mo  rev labs in detail  Her updated medication list for this problem includes:    Hydrochlorothiazide 25 Mg Tabs (Hydrochlorothiazide) .Marland Kitchen... Take 1 by mouth once daily    Cozaar 50 Mg Tabs (Losartan potassium) .Marland Kitchen... 1 by mouth once daily in am  BP today: 170/96 Prior BP: 120/68 (10/22/2008)  Labs Reviewed: K+: 3.9  (09/19/2009) Creat: : 1.0 (09/19/2009)   Chol: 180 (09/19/2009)   HDL: 88.40 (09/19/2009)   LDL: 74 (09/19/2009)   TG: 87.0 (09/19/2009)  Orders: Prescription Created Electronically [phone])  Problem # 2:  EDEMA (ICD-782.3) Assessment: Improved  in good control with hctz  lab reviewed  Her updated medication list for this problem includes:    Hydrochlorothiazide 25 Mg Tabs (Hydrochlorothiazide) .Marland Kitchen... Take 1 by mouth once daily  Discussed elevation of the legs, use of compression stockings, sodium restiction, and medication use.   Problem # 3:  OSTEOPOROSIS (ICD-733.00) Assessment: Unchanged sched dexa intol to bisphosphenates  ca and D rev  D level ok enc exercise  Orders: Radiology Referral (Radiology)  Problem # 4:  HYPOTHYROIDISM (ICD-244.9) Assessment: Unchanged  tsh is stable/ no clinical changes  no change in dose  Her updated medication list for this problem includes:  Levoxyl 75 Mcg Tabs (Levothyroxine sodium) .Marland Kitchen... Take 1 tablet by mouth once a day  Labs Reviewed: TSH: 2.69 (09/19/2009)    Chol: 180 (09/19/2009)   HDL: 88.40 (09/19/2009)   LDL: 74 (09/19/2009)   TG: 87.0 (09/19/2009)  Problem # 5:  HYPERLIPIDEMIA (ICD-272.4) Assessment: Unchanged  chol is controlled well on 1/2 tab of zocor -- does not tol full pill  Her updated medication list for this problem includes:    Zocor 20 Mg Tabs (Simvastatin) .Marland Kitchen... Take 1/2 tab by mouth at bedtime  Labs Reviewed: SGOT: 30 (09/19/2009)   SGPT: 21 (09/19/2009)   HDL:88.40 (09/19/2009), 70.90 (03/07/2009)  LDL:74 (09/19/2009), 62 (03/07/2009)  Chol:180 (09/19/2009), 154 (03/07/2009)  Trig:87.0 (09/19/2009), 105.0 (03/07/2009)  Complete Medication List: 1)  Levoxyl 75 Mcg Tabs (Levothyroxine sodium) .... Take 1 tablet by mouth once a day 2)  Librium 10 Mg Caps (Chlordiazepoxide hcl) .... Take one by mouth two times a day 3)  Imipramine Hcl 25 Mg Tabs (Imipramine hcl) .... Take 1 tablet by mouth twice a day 4)   Astelin 137 Mcg/spray Soln (Azelastine hcl) .... Use as directed as needed 5)  Allegra 180 Mg Tabs (Fexofenadine hcl) .... Take one by mouth daily as needed 6)  Flaxseed Oil 1000 Mg Caps (Flaxseed (linseed)) .... Take 1 tablet by mouth once a day 7)  Garlic 400 Mg Tbec (Garlic) .... Take 1 tablet by mouth once a day 8)  Fish Oil Oil (Fish oil) .... Take 1 tablet by mouth once a day 9)  Hydrochlorothiazide 25 Mg Tabs (Hydrochlorothiazide) .... Take 1 by mouth once daily 10)  Zocor 20 Mg Tabs (Simvastatin) .... Take 1/2 tab by mouth at bedtime 11)  Travatan Z 0.004 % Soln (Travoprost) .... One drop in each eye at bedtime 12)  Cozaar 50 Mg Tabs (Losartan potassium) .Marland Kitchen.. 1 by mouth once daily in am  Patient Instructions: 1)  call your insurance to see if shingles vaccine is covered  2)  if so - call us in 1-2 mo to see if we have the vaccine  3)  we will schedule bone density test at check out  4)  labs look ok  5)  your blood pressure is high 6)  start new medicine- cozaar 50 mg 1 pill each am in addition to other medicines (I sent this to your pharmacy )  7)  if you have side effects - call  8)  follow up in 1 month Prescriptions: COZAAR 50 MG TABS (LOSARTAN POTASSIUM) 1 by mouth once daily in am  #30 x 1   Entered and Authorized by:   Judith Part MD   Signed by:   Judith Part MD on 10/03/2009   Method used:   Electronically to        [facility] [address] (retail)       [address]       [place], [place]  [address]       Ph: [phone]       Fax: [phone]   RxID:   [id number] ZOCOR 20 MG TABS (SIMVASTATIN) Take 1/2 tab by mouth at bedtime  #45 x 3   Entered and Authorized by:   Judith Part MD   Signed by:   Judith Part MD on 10/03/2009   Method used:   Print then Give to Patient   RxID:   [id number] ALLEGRA 180 MG  TABS (  FEXOFENADINE HCL) take one by mouth daily as needed  #90 x 3   Entered and Authorized  by:   Judith Part MD   Signed by:   Judith Part MD on 10/03/2009   Method used:   Print then Give to Patient   RxID:   [id number] IMIPRAMINE HCL 25 MG TABS (IMIPRAMINE HCL) Take 1 tablet by mouth twice a day  #180 x 3   Entered and Authorized by:   Judith Part MD   Signed by:   Judith Part MD on 10/03/2009   Method used:   Print then Give to Patient   RxID:   [id number] LEVOXYL 75 MCG TABS (LEVOTHYROXINE SODIUM) Take 1 tablet by mouth once a day Brand medically necessary #90 x 3   Entered and Authorized by:   Judith Part MD   Signed by:   Judith Part MD on 10/03/2009   Method used:   Print then Give to Patient   RxID:   [id number]   Current Allergies (reviewed today): ! FOSAMAX (ALENDRONATE SODIUM) ! * ACTONEL ! ACE INHIBITORS * SULFA (SULFONAMIDES) GROUP MIRALAX (POLYETHYLENE GLYCOL 3350) ZOCOR

## 2010-05-26 NOTE — Letter (Signed)
Summary: Results Follow up Letter  [facility]  [address] [place], [place] [address]   Phone: [phone]  Fax: [phone]    07/02/2009 MRN: [id number]     [facility] [address] [place], [place]  [address]    Dear Brooke Beasley,  The following are the results of your recent test(s):  Test         Result    Pap Smear:        Normal _____  Not Normal _____ Comments: ______________________________________________________ Cholesterol: LDL(Bad cholesterol):         Your goal is less than:         HDL (Good cholesterol):       Your goal is more than: Comments:  ______________________________________________________ Mammogram:        Normal _x____  Not Normal _____ Comments:Repeat in 1 year  ___________________________________________________________________ Hemoccult:        Normal _____  Not normal _______ Comments:    _____________________________________________________________________ Other Tests:    We routinely do not discuss normal results over the telephone.  If you desire a copy of the results, or you have any questions about this information we can discuss them at your next office visit.   Sincerely,   Roxy Manns MD

## 2010-05-26 NOTE — Progress Notes (Signed)
Summary: refill request for librium  Phone Note Refill Request Message from:  [employer]  Refills Requested: Medication #1:  LIBRIUM 10 MG CAPS take one by mouth two times a day   Last Refilled: 03/31/2009 Electronic refill request from [facility] [address].  Initial call taken by: Lowella Petties CMA,  May 14, 2009 1:57 PM  Follow-up for Phone Call        px written on EMR for call in  Follow-up by: Judith Part MD,  May 14, 2009 2:38 PM  Additional Follow-up for Phone Call Additional follow up Details #1::        [facility]ed to [facility]. Additional Follow-up by: Lowella Petties CMA,  May 14, 2009 2:51 PM    New/Updated Medications: LIBRIUM 10 MG CAPS (CHLORDIAZEPOXIDE HCL) take one by mouth two times a day Prescriptions: LIBRIUM 10 MG CAPS (CHLORDIAZEPOXIDE HCL) take one by mouth two times a day  #60 x 3   Entered and Authorized by:   Judith Part MD   Signed by:   Lowella Petties CMA on 05/14/2009   Method used:   Telephoned to ...       [facility] [address]retail)       [address]       [place], [place]  [address]       Ph: [phone]       Fax: [phone]   RxID:   [id number]

## 2010-05-26 NOTE — Assessment & Plan Note (Signed)
Summary: ROA FOR 1 MONTH FOLLOW-UP/JRR   Vital Signs:  Patient profile:   77 year old female Height:      61.5 inches Weight:      161.75 pounds BMI:     30.18 Temp:     98.2 degrees F oral Pulse rate:   76 / minute Pulse rhythm:   regular BP sitting:   132 / 74  (left arm) Cuff size:   regular  Vitals Entered By: Lewanda Rife LPN (November 04, 2009 8:10 AM) CC: one month f/u   History of Present Illness: here for f/u of HTN - last visit bp high  added cozaar (had ace cough in the past)   no trouble with cozaar medicine - no side effects    dexa LS is slt imp and hip in nl range   is taking her ca and vit D every day - is a liquid     Allergies: 1)  ! Fosamax (Alendronate Sodium) 2)  ! * Actonel 3)  ! Ace Inhibitors 4)  * Sulfa (Sulfonamides) Group 5)  Miralax (Polyethylene Glycol 3350) 6)  Zocor  Past History:  Past Medical History: Last updated: 05/24/2008 Osteoarthritis Anxiety Depression Hyperlipidemia Hypothyroidism Osteoporosis shingles in past Bacterial overgrowth of small colon HTN  degenerative disc dz- LS   Past Surgical History: Last updated: 02/20/2008 Tonsillectomy Bowel obstruction (1999) Colonoscopy- not finished (1999) Dexa- OP (1997-1998) Diverticulosis Dexa- OP, borderline, spine -2.44T (12/2002) Colonoscopy- slight hemorrhage rectosigmoid area (02/2003) EGD- neg Dexa- decreased BMD-OP (06/2006) Hemorrhoid procedure-(07/2006) LS x ray with degenerative disc and facet change  Family History: Last updated: 04/02/2008 Father:  Mother:  Siblings: brother deceased age79- DM, CAD sister with vasc dz/ carotid  Social History: Last updated: 01/03/2008 Marital Status: Married Children: 2 Occupation: retired non smoker   Risk Factors: Smoking Status: never (11/02/2006)  Review of Systems General:  Denies fatigue, loss of appetite, and malaise. Eyes:  Denies blurring and eye irritation. CV:  Denies chest pain or discomfort,  lightheadness, and palpitations. Resp:  Denies cough, shortness of breath, sputum productive, and wheezing. GI:  Denies abdominal pain, change in bowel habits, and indigestion. GU:  Denies urinary frequency. MS:  Denies muscle aches. Derm:  Denies lesion(s), poor wound healing, and rash. Neuro:  Denies numbness and tingling. Psych:  Denies anxiety and depression. Endo:  Denies cold intolerance and heat intolerance.  Physical Exam  General:  overweight but generally well appearing  Head:  normocephalic, atraumatic, and no abnormalities observed.   Eyes:  vision grossly intact, pupils equal, pupils round, and pupils reactive to light.   Mouth:  pharynx pink and moist.   Neck:  supple with full rom and no masses or thyromegally, no JVD or carotid bruit  Chest Wall:  No deformities, masses, or tenderness noted. Lungs:  Normal respiratory effort, chest expands symmetrically. Lungs are clear to auscultation, no crackles or wheezes. Heart:  Normal rate and regular rhythm. S1 and S2 normal without gallop, murmur, click, rub or other extra sounds. Abdomen:  no renal bruits  Msk:  No deformity or scoliosis noted of thoracic or lumbar spine.  no acute joint changes  no kyphosis  Extremities:  No clubbing, cyanosis, edema, or deformity noted with normal full range of motion of all joints.   Neurologic:  sensation intact to light touch, gait normal, and DTRs symmetrical and normal.   Skin:  Intact without suspicious lesions or rashes Cervical Nodes:  No lymphadenopathy noted Psych:  normal affect, talkative and  pleasant    Impression & Recommendations:  Problem # 1:  HYPERTENSION, BENIGN (ICD-401.1) Assessment Improved  much imp with addn of cozaar  continue this and healthy diet/ exercise as tol f/u 6 mo  Her updated medication list for this problem includes:    Hydrochlorothiazide 25 Mg Tabs (Hydrochlorothiazide) .Marland Kitchen... Take 1 by mouth once daily    Cozaar 50 Mg Tabs (Losartan potassium)  .Marland Kitchen... 1 by mouth once daily in am  BP today: 132/74 Prior BP: 170/96 (10/03/2009)  Labs Reviewed: K+: 3.9 (09/19/2009) Creat: : 1.0 (09/19/2009)   Chol: 180 (09/19/2009)   HDL: 88.40 (09/19/2009)   LDL: 74 (09/19/2009)   TG: 87.0 (09/19/2009)  Problem # 2:  OSTEOPOROSIS (ICD-733.00) Assessment: Improved OP in spine (in pt intol of bisphosphenates) is slt imp and hip in nl range rev report with pt disc ca and D and exercise re check 2 y nl vit D level  Complete Medication List: 1)  Levoxyl 75 Mcg Tabs (Levothyroxine sodium) .... Take 1 tablet by mouth once a day 2)  Librium 10 Mg Caps (Chlordiazepoxide hcl) .... Take one by mouth two times a day 3)  Imipramine Hcl 25 Mg Tabs (Imipramine hcl) .... Take 1 tablet by mouth twice a day 4)  Astelin 137 Mcg/spray Soln (Azelastine hcl) .... Use as directed as needed 5)  Allegra 180 Mg Tabs (Fexofenadine hcl) .... Take one by mouth daily as needed 6)  Flaxseed Oil 1000 Mg Caps (Flaxseed (linseed)) .... Take 1 tablet by mouth once a day 7)  Garlic 400 Mg Tbec (Garlic) .... Take 1 tablet by mouth once a day 8)  Fish Oil Oil (Fish oil) .... Take 1 tablet by mouth once a day 9)  Hydrochlorothiazide 25 Mg Tabs (Hydrochlorothiazide) .... Take 1 by mouth once daily 10)  Zocor 20 Mg Tabs (Simvastatin) .... Take 1/2 tab by mouth at bedtime 11)  Travatan Z 0.004 % Soln (Travoprost) .... One drop in each eye at bedtime 12)  Cozaar 50 Mg Tabs (Losartan potassium) .Marland Kitchen.. 1 by mouth once daily in am  Patient Instructions: 1)  bp was 132/74 today- good control  2)  the current recommendation for calcium intake is 1200-1500 mg daily with 1000 IU of vitamin D  3)  bone density is fairly stable  4)  follow up with me in about 6 months  Prescriptions: COZAAR 50 MG TABS (LOSARTAN POTASSIUM) 1 by mouth once daily in am  #90 x 3   Entered and Authorized by:   Judith Part MD   Signed by:   Judith Part MD on 11/04/2009   Method used:   Print then Give to  Patient   RxID:   [id number]   Current Allergies (reviewed today): ! FOSAMAX (ALENDRONATE SODIUM) ! * ACTONEL ! ACE INHIBITORS * SULFA (SULFONAMIDES) GROUP MIRALAX (POLYETHYLENE GLYCOL 3350) ZOCOR

## 2010-05-26 NOTE — Progress Notes (Signed)
Summary: r3efills  Phone Note Refill Request Message from:  Patient on December 01, 2009 4:15 PM  Refills Requested: Medication #1:  IMIPRAMINE HCL 25 MG TABS Take 1 tablet by mouth twice a day   Supply Requested: 1 month [facility] [address].Consuello Masse CMA     Method Requested: Electronic Initial call taken by: Benny Lennert CMA Duncan Dull),  December 01, 2009 4:15 PM  Follow-up for Phone Call        px written on EMR for call in  Follow-up by: Judith Part MD,  December 01, 2009 4:54 PM  Additional Follow-up for Phone Call Additional follow up Details #1::        Medication phoned to [facility] [address] [facility] as instructed. Patient notified as instructed by telephone. Lewanda Rife LPN  December 01, 2009 5:23 PM     Prescriptions: IMIPRAMINE HCL 25 MG TABS (IMIPRAMINE HCL) Take 1 tablet by mouth twice a day  #180 x 3   Entered and Authorized by:   Judith Part MD   Signed by:   Lewanda Rife LPN on 04/54/0981   Method used:   Telephoned to ...       [facility] [address] (retail)       [address], [address]       [place], [place]  [address]       Ph: [phone]       Fax: [phone]   RxID:   [id number]

## 2010-05-26 NOTE — Progress Notes (Signed)
Summary: Chlordiazep 10mg  rx  Phone Note Refill Request Call back at [phone] Message from:  [facility] [address] on December 05, 2009 3:20 PM  Refills Requested: Medication #1:  LIBRIUM 10 MG CAPS take one by mouth two times a day   Last Refilled: 09/16/2009 [facility] [address] request refill for Chlordiazep 10mg . last refill date 09/16/09.Please advise.    Method Requested: Telephone to Pharmacy Initial call taken by: Lewanda Rife LPN,  December 05, 2009 3:20 PM  Follow-up for Phone Call        Rx called to pharmacy Follow-up by: Benny Lennert CMA Duncan Dull),  December 05, 2009 4:34 PM    Prescriptions: LIBRIUM 10 MG CAPS (CHLORDIAZEPOXIDE HCL) take one by mouth two times a day  #60 x 0   Entered and Authorized by:   Kerby Nora MD   Signed by:   Kerby Nora MD on 12/05/2009   Method used:   Telephoned to ...       [facility] [address] (retail)       [address]       [place], [place]  [address]       Ph: [phone]       Fax: [phone]   RxID:   [id number]

## 2010-05-28 NOTE — Assessment & Plan Note (Signed)
Summary: 6 MONTH FOLLOWUP/RBH   Vital Signs:  Patient profile:   77 year old female Weight:      161 pounds BMI:     30.04 Temp:     97.9 degrees F oral Pulse rate:   84 / minute Pulse rhythm:   regular BP sitting:   150 / 100  (left arm) Cuff size:   regular  Vitals Entered By: Sydell Axon LPN (May 11, 2010 8:14 AM) CC: 6 week follow-up   History of Present Illness: here for f/u of HTN  is also having a hard time with constipation -- using metamucil and taking miralax eating fruits and veg - Is "trying"  this is not new but is much worse  Dr Matthias Hughs in the past dx with bacterial overgrowth  took probiotics for a long time and no help bm are "not normal " -- ? hard stool stuck / loose stool bypasses it  water intake  3-4 glasse   bp first check today is 150/100 on cozaar and hct   wt is stable  thyroid stable last check    Allergies: 1)  ! Fosamax (Alendronate Sodium) 2)  ! * Actonel 3)  ! Ace Inhibitors 4)  * Sulfa (Sulfonamides) Group 5)  Miralax (Polyethylene Glycol 3350) 6)  Zocor  Past History:  Past Medical History: Last updated: 05/24/2008 Osteoarthritis Anxiety Depression Hyperlipidemia Hypothyroidism Osteoporosis shingles in past Bacterial overgrowth of small colon HTN  degenerative disc dz- LS   Past Surgical History: Last updated: 02/20/2008 Tonsillectomy Bowel obstruction (1999) Colonoscopy- not finished (1999) Dexa- OP (1997-1998) Diverticulosis Dexa- OP, borderline, spine -2.44T (12/2002) Colonoscopy- slight hemorrhage rectosigmoid area (02/2003) EGD- neg Dexa- decreased BMD-OP (06/2006) Hemorrhoid procedure-(07/2006) LS x ray with degenerative disc and facet change  Family History: Last updated: 04/02/2008 Father:  Mother:  Siblings: brother deceased age79- DM, CAD sister with vasc dz/ carotid  Social History: Last updated: 01/03/2008 Marital Status: Married Children: 2 Occupation: retired non smoker   Risk  Factors: Smoking Status: never (11/02/2006)  Review of Systems General:  Denies fatigue, loss of appetite, and malaise. Eyes:  Denies blurring. CV:  Denies chest pain or discomfort and palpitations. Resp:  Denies cough and wheezing. GI:  Complains of constipation and gas; denies abdominal pain, bloody stools, dark tarry stools, indigestion, loss of appetite, nausea, and vomiting. GU:  Denies dysuria and urinary frequency. MS:  Denies muscle aches, cramps, and muscle weakness. Derm:  Denies itching, lesion(s), poor wound healing, and rash. Neuro:  Denies headaches. Endo:  Denies cold intolerance and heat intolerance. Heme:  Denies abnormal bruising and bleeding.  Physical Exam  General:  overweight but generally well appearing  Head:  normocephalic, atraumatic, and no abnormalities observed.   Eyes:  vision grossly intact, pupils equal, pupils round, and pupils reactive to light.  no conjunctival pallor, injection or icterus  Mouth:  pharynx pink and moist.   Neck:  supple with full rom and no masses or thyromegally, no JVD or carotid bruit  Chest Wall:  No deformities, masses, or tenderness noted. Lungs:  Normal respiratory effort, chest expands symmetrically. Lungs are clear to auscultation, no crackles or wheezes. Heart:  Normal rate and regular rhythm. S1 and S2 normal without gallop, murmur, click, rub or other extra sounds. Abdomen:  soft, normal bowel sounds, no distention, no masses, no hepatomegaly, and no splenomegaly.  mild tenderness bilat LQ without rebound or gaurding no M noted  Msk:  No deformity or scoliosis noted of thoracic or lumbar  spine.  no acute joint changes  no kyphosis  Pulses:  R and L carotid,radial,femoral,dorsalis pedis and posterior tibial pulses are full and equal bilaterally varicosities noted  Extremities:  No clubbing, cyanosis, edema, or deformity noted with normal full range of motion of all joints.   Neurologic:  sensation intact to light touch,  gait normal, and DTRs symmetrical and normal.   Skin:  Intact without suspicious lesions or rashes Cervical Nodes:  No lymphadenopathy noted Inguinal Nodes:  No significant adenopathy Psych:  normal affect, talkative and pleasant    Impression & Recommendations:  Problem # 1:  HYPERTENSION, BENIGN (ICD-401.1) Assessment Deteriorated  this went up  increase cozaar to 100 mg daily  f/u 1 mo update if side eff disc healthy diet (low simple sugar/ choose complex carbs/ low sat fat) diet and exercise in detail  Her updated medication list for this problem includes:    Hydrochlorothiazide 25 Mg Tabs (Hydrochlorothiazide) .Marland Kitchen... Take 1 by mouth once daily    Cozaar 100 Mg Tabs (Losartan potassium) .Marland Kitchen... 1 by mouth once daily  BP today: 150/100 Prior BP: 128/74 (03/04/2010)  Labs Reviewed: K+: 3.9 (09/19/2009) Creat: : 1.0 (09/19/2009)   Chol: 180 (09/19/2009)   HDL: 88.40 (09/19/2009)   LDL: 74 (09/19/2009)   TG: 87.0 (09/19/2009)  Orders: Prescription Created Electronically [phone])  Problem # 2:  Hx of CONSTIPATION, CHRONIC (ICD-564.09) Assessment: Deteriorated getting worse -- ? pt unsure when due for colonosc disc fiber and water intake ref to [facility]- pt request  Her updated medication list for this problem includes:    Metamucil 30.9 % Powd (Psyllium) ..... Otc as directed.    Miralax Powd (Polyethylene glycol 3350) ..... Otc as directed.  Orders: Gastroenterology Referral (GI)  Complete Medication List: 1)  Levoxyl 75 Mcg Tabs (Levothyroxine sodium) .... Take 1 tablet by mouth once a day 2)  Librium 10 Mg Caps (Chlordiazepoxide hcl) .... Take one by mouth two times a day 3)  Imipramine Hcl 25 Mg Tabs (Imipramine hcl) .... Take 1 tablet by mouth twice a day 4)  Astelin 137 Mcg/spray Soln (Azelastine hcl) .... Use as directed as needed 5)  Allegra 180 Mg Tabs (Fexofenadine hcl) .... Take one by mouth daily as needed 6)  Flaxseed Oil 1000 Mg Caps (Flaxseed (linseed))  .... Take 1 tablet by mouth once a day 7)  Garlic 400 Mg Tbec (Garlic) .... Take 1 tablet by mouth once a day 8)  Fish Oil Oil (Fish oil) .... Take 1 tablet by mouth once a day 9)  Hydrochlorothiazide 25 Mg Tabs (Hydrochlorothiazide) .... Take 1 by mouth once daily 10)  Zocor 20 Mg Tabs (Simvastatin) .... Take 1/2 tab by mouth at bedtime 11)  Travatan Z 0.004 % Soln (Travoprost) .... One drop in each eye at bedtime 12)  Cozaar 100 Mg Tabs (Losartan potassium) .Marland Kitchen.. 1 by mouth once daily 13)  Lumigan 0.01 % Soln (Bimatoprost) .... One drop both eyes at night 14)  Metamucil 30.9 % Powd (Psyllium) .... Otc as directed. 15)  Miralax Powd (Polyethylene glycol 3350) .... Otc as directed. 16)  Netty Pot  .... Otc as directed. 17)  Zostavax 25956 Unt/0.29ml Solr (Zoster vaccine live) .... Inject times one as directed  Patient Instructions: 1)  blood pressure is high 2)  increase your cozaar/ losartan -- from 50 to 100 mg daily  3)  continue other meds 4)  increase water intake to at least 8 glasses per day 5)  we will do  GI referral at check out  Prescriptions: COZAAR 100 MG TABS (LOSARTAN POTASSIUM) 1 by mouth once daily  #30 x 11   Entered and Authorized by:   Judith Part MD   Signed by:   Judith Part MD on 05/11/2010   Method used:   Electronically to        [facility] [address] (retail)       [address], [address]       [place], [place]  [address]       Ph: [phone]       Fax: [phone]   RxID:   [id number]    Orders Added: 1)  Gastroenterology Referral [GI] 2)  Est. Patient Level III [41660] 3)  Prescription Created Electronically [phone]    Current Allergies (reviewed today): ! FOSAMAX (ALENDRONATE SODIUM) ! * ACTONEL ! ACE INHIBITORS * SULFA (SULFONAMIDES) GROUP MIRALAX (POLYETHYLENE GLYCOL 3350) ZOCOR

## 2010-06-09 ENCOUNTER — Other Ambulatory Visit: Payer: Self-pay | Admitting: Gastroenterology

## 2010-06-09 ENCOUNTER — Encounter: Payer: Self-pay | Admitting: Family Medicine

## 2010-06-09 DIAGNOSIS — K589 Irritable bowel syndrome without diarrhea: Secondary | ICD-10-CM

## 2010-06-11 ENCOUNTER — Encounter: Payer: Self-pay | Admitting: Family Medicine

## 2010-06-11 ENCOUNTER — Ambulatory Visit (INDEPENDENT_AMBULATORY_CARE_PROVIDER_SITE_OTHER): Payer: MEDICARE | Admitting: Family Medicine

## 2010-06-11 DIAGNOSIS — N949 Unspecified condition associated with female genital organs and menstrual cycle: Secondary | ICD-10-CM | POA: Insufficient documentation

## 2010-06-11 DIAGNOSIS — I10 Essential (primary) hypertension: Secondary | ICD-10-CM

## 2010-06-11 LAB — CONVERTED CEMR LAB
Bacteria, UA: 0
Casts: 0 /lpf
Glucose, Urine, Semiquant: NEGATIVE
Nitrite: NEGATIVE
Specific Gravity, Urine: 1.01
Urine crystals, microscopic: 0 /hpf
WBC Urine, dipstick: NEGATIVE
Yeast, UA: 0
pH: 7

## 2010-06-16 ENCOUNTER — Ambulatory Visit
Admission: RE | Admit: 2010-06-16 | Discharge: 2010-06-16 | Disposition: A | Discharge status: Home / Self Care | Payer: MEDICARE | Source: Ambulatory Visit | Attending: Gastroenterology | Admitting: Gastroenterology

## 2010-06-16 ENCOUNTER — Other Ambulatory Visit: Payer: Self-pay | Admitting: Gastroenterology

## 2010-06-16 ENCOUNTER — Encounter: Payer: Self-pay | Admitting: Family Medicine

## 2010-06-16 DIAGNOSIS — K589 Irritable bowel syndrome without diarrhea: Secondary | ICD-10-CM

## 2010-06-23 NOTE — Assessment & Plan Note (Signed)
Summary: FOLLOW-UP   Vital Signs:  Patient profile:   77 year old female Height:      61.5 inches Weight:      164.50 pounds BMI:     30.69 Temp:     97.5 degrees F oral Pulse rate:   84 / minute Pulse rhythm:   regular BP sitting:   132 / 80  (left arm) Cuff size:   regular  Vitals Entered By: Lewanda Rife LPN (June 11, 2010 8:44 AM)  Serial Vital Signs/Assessments:  Time      Position  BP       Pulse  Resp  Temp     By                     135/80                         Judith Part MD  CC: 1 month f/u and perineal burning and occasional back pain   History of Present Illness: here for f/u of HTN and also for new urinary symptoms  wt is up 2 lb   bp much imp on inc cozaar 132/80 she took the 100 mg for 2 days and got very dizzy -- so went to 75 mg per day  does not check blood pressure at home   decided to back to Dr Matthias Hughs for ongoing GI complaints  is going to barium enema tues   c/o perineal burning and back pain -- and vaginal discomfort  was red looking on L labia so she used triple abx  think this is irritated due to some more bowel movements  has frequent urination at night -- this is normal for her  urine is clear no dysuria   Allergies: 1)  ! Fosamax (Alendronate Sodium) 2)  ! * Actonel 3)  ! Ace Inhibitors 4)  * Sulfa (Sulfonamides) Group 5)  Miralax (Polyethylene Glycol 3350) 6)  Zocor  Past History:  Past Medical History: Last updated: 05/24/2008 Osteoarthritis Anxiety Depression Hyperlipidemia Hypothyroidism Osteoporosis shingles in past Bacterial overgrowth of small colon HTN  degenerative disc dz- LS   Past Surgical History: Last updated: 02/20/2008 Tonsillectomy Bowel obstruction (1999) Colonoscopy- not finished (1999) Dexa- OP (1997-1998) Diverticulosis Dexa- OP, borderline, spine -2.44T (12/2002) Colonoscopy- slight hemorrhage rectosigmoid area (02/2003) EGD- neg Dexa- decreased BMD-OP (06/2006) Hemorrhoid  procedure-(07/2006) LS x ray with degenerative disc and facet change  Family History: Last updated: 04/02/2008 Father:  Mother:  Siblings: brother deceased age79- DM, CAD sister with vasc dz/ carotid  Social History: Last updated: 01/03/2008 Marital Status: Married Children: 2 Occupation: retired non smoker   Risk Factors: Smoking Status: never (11/02/2006)  Review of Systems General:  Denies chills, fatigue, fever, and malaise. Eyes:  Denies blurring and eye irritation. CV:  Denies chest pain or discomfort, lightheadness, and palpitations. Resp:  Denies cough and wheezing. GI:  Complains of diarrhea; denies abdominal pain and bloody stools. GU:  Complains of dysuria; denies discharge and urinary frequency. Derm:  Denies lesion(s), poor wound healing, and rash. Neuro:  Denies headaches, numbness, sensation of room spinning, and tingling. Endo:  Denies excessive thirst and excessive urination. Heme:  Denies abnormal bruising and bleeding.  Physical Exam  General:  overweight but generally well appearing  Head:  normocephalic, atraumatic, and no abnormalities observed.   Mouth:  pharynx pink and moist.   Neck:  supple with full rom and no masses or thyromegally, no  JVD or carotid bruit  Lungs:  Normal respiratory effort, chest expands symmetrically. Lungs are clear to auscultation, no crackles or wheezes. Heart:  Normal rate and regular rhythm. S1 and S2 normal without gallop, murmur, click, rub or other extra sounds. Abdomen:  no suprapubic tenderness or fullness felt  Msk:  no CVA tenderness  Pulses:  R and L carotid,radial,femoral,dorsalis pedis and posterior tibial pulses are full and equal bilaterally varicosities noted  Extremities:  No clubbing, cyanosis, edema, or deformity noted with normal full range of motion of all joints.   Neurologic:  sensation intact to light touch, gait normal, and DTRs symmetrical and normal.   Skin:  Intact without suspicious lesions or  rashes Cervical Nodes:  No lymphadenopathy noted Inguinal Nodes:  No significant adenopathy Psych:  normal affect, talkative and pleasant    Impression & Recommendations:  Problem # 1:  HYPERTENSION, BENIGN (ICD-401.1) Assessment Improved much improved with cozaar 75 total dose no other changes disc diet / fluid intake and wt loss efforts  f/u 6 mo  The following medications were removed from the medication list:    Cozaar 100 Mg Tabs (Losartan potassium) .Marland Kitchen... 1 by mouth once daily Her updated medication list for this problem includes:    Hydrochlorothiazide 25 Mg Tabs (Hydrochlorothiazide) .Marland Kitchen... Take 1 by mouth once daily    Cozaar 50 Mg Tabs (Losartan potassium) .Marland Kitchen... 1/2 tablet by mouth daily (with cozaar 100mg  taking 1/2 tab daily totaling 75mg  daily)    Cozaar 50 Mg Tabs (Losartan potassium) .Marland Kitchen... 1 1/2 by mouth once daily  Problem # 2:  UNSPEC SYMPTOM ASSOC W/FEMALE GENITAL ORGANS (ICD-625.9) Assessment: New  ua neg already resolved with some abx oint to labia  pt thinks it was irritation from bowels  does not want exam today but will f/u if symptoms return  Orders: UA Dipstick W/ Micro (manual) (04540)  Complete Medication List: 1)  Levoxyl 75 Mcg Tabs (Levothyroxine sodium) .... Take 1 tablet by mouth once a day 2)  Librium 10 Mg Caps (Chlordiazepoxide hcl) .... Take one by mouth two times a day 3)  Imipramine Hcl 25 Mg Tabs (Imipramine hcl) .... Take 1 tablet by mouth twice a day 4)  Astelin 137 Mcg/spray Soln (Azelastine hcl) .... Use as directed as needed 5)  Allegra 180 Mg Tabs (Fexofenadine hcl) .... Take one by mouth daily as needed 6)  Flaxseed Oil 1000 Mg Caps (Flaxseed (linseed)) .... Take 1 tablet by mouth once a day 7)  Garlic 400 Mg Tbec (Garlic) .... Take 1 tablet by mouth once a day 8)  Fish Oil Oil (Fish oil) .... Take 1 tablet by mouth once a day 9)  Hydrochlorothiazide 25 Mg Tabs (Hydrochlorothiazide) .... Take 1 by mouth once daily 10)  Zocor 20 Mg  Tabs (Simvastatin) .... Take 1/2 tab by mouth at bedtime 11)  Lumigan 0.01 % Soln (Bimatoprost) .... One drop both eyes at night 12)  Metamucil 30.9 % Powd (Psyllium) .... Otc as directed. 13)  Miralax Powd (Polyethylene glycol 3350) .... Otc as directed. 14)  Netty Pot  .... Otc as directed. 15)  Zostavax 98119 Unt/0.72ml Solr (Zoster vaccine live) .... Inject times one as directed 16)  Cozaar 50 Mg Tabs (Losartan potassium) .... 1/2 tablet by mouth daily (with cozaar 100mg  taking 1/2 tab daily totaling 75mg  daily) 17)  Cozaar 50 Mg Tabs (Losartan potassium) .Marland Kitchen.. 1 1/2 by mouth once daily  Patient Instructions: 1)  follow up in 6 months  2)  blood  pressure is good on 75 mg - continue this  3)  call if urinary symptoms return Prescriptions: COZAAR 50 MG TABS (LOSARTAN POTASSIUM) 1 1/2 by mouth once daily  #3 months x 3   Entered and Authorized by:   Judith Part MD   Signed by:   Judith Part MD on 06/11/2010   Method used:   Print then Give to Patient   RxID:   [id number]    Orders Added: 1)  UA Dipstick W/ Micro (manual) [81000] 2)  Est. Patient Level IV [69629]    Current Allergies (reviewed today): ! FOSAMAX (ALENDRONATE SODIUM) ! * ACTONEL ! ACE INHIBITORS * SULFA (SULFONAMIDES) GROUP MIRALAX (POLYETHYLENE GLYCOL 3350) ZOCOR  Laboratory Results   Urine Tests  Date/Time Received: June 11, 2010 8:47 AM  Date/Time Reported: June 11, 2010 8:47 AM   Routine Urinalysis   Color: yellow Appearance: Clear Glucose: negative   (Normal Range: Negative) Bilirubin: negative   (Normal Range: Negative) Ketone: negative   (Normal Range: Negative) Spec. Gravity: 1.010   (Normal Range: 1.003-1.035) Blood: trace-lysed   (Normal Range: Negative) pH: 7.0   (Normal Range: 5.0-8.0) Protein: negative   (Normal Range: Negative) Urobilinogen: 0.2   (Normal Range: 0-1) Nitrite: negative   (Normal Range: Negative) Leukocyte Esterace: negative   (Normal Range:  Negative)  Urine Microscopic WBC/HPF: 0 RBC/HPF: 0 Bacteria/HPF: 0 Mucous/HPF: few Epithelial/HPF: 0-1 Crystals/HPF: 0 Casts/LPF: 0 Yeast/HPF: 0 Other: 0

## 2010-06-23 NOTE — Letter (Signed)
Summary: [facility]- GI note   [facility]   Imported By: Kassie Mends 06/16/2010 09:33:45  _____________________________________________________________________  External Attachment:    Type:   Image     Comment:   External Document

## 2010-06-30 ENCOUNTER — Other Ambulatory Visit: Payer: Self-pay | Admitting: Gastroenterology

## 2010-06-30 ENCOUNTER — Ambulatory Visit
Admission: RE | Admit: 2010-06-30 | Discharge: 2010-06-30 | Disposition: A | Discharge status: Home / Self Care | Payer: MEDICARE | Source: Ambulatory Visit | Attending: Gastroenterology | Admitting: Gastroenterology

## 2010-06-30 DIAGNOSIS — K5901 Slow transit constipation: Secondary | ICD-10-CM

## 2010-07-07 ENCOUNTER — Encounter: Payer: Self-pay | Admitting: Family Medicine

## 2010-07-13 ENCOUNTER — Other Ambulatory Visit: Payer: Self-pay | Admitting: Gastroenterology

## 2010-07-13 ENCOUNTER — Ambulatory Visit
Admission: RE | Admit: 2010-07-13 | Discharge: 2010-07-13 | Disposition: A | Discharge status: Home / Self Care | Payer: MEDICARE | Source: Ambulatory Visit | Attending: Gastroenterology | Admitting: Gastroenterology

## 2010-07-14 NOTE — Miscellaneous (Signed)
Summary: Zostavax entered historically   Clinical Lists Changes  Observations: Added new observation of ZOSTAVAX: Zostavax (05/11/2010 8:35)      Immunization History:  Zostavax History:    Zostavax:  zostavax (05/11/2010) Was given at [facility].Lewanda Rife LPN  July 07, 2010 8:35 AM

## 2010-09-08 ENCOUNTER — Other Ambulatory Visit: Payer: Self-pay | Admitting: Family Medicine

## 2010-09-08 DIAGNOSIS — Z1231 Encounter for screening mammogram for malignant neoplasm of breast: Secondary | ICD-10-CM

## 2010-09-08 NOTE — Assessment & Plan Note (Signed)
[facility] OFFICE NOTE   RHODIE, CIENFUEGOS                      MRN:          [id number]  DATE:03/22/2007                            DOB:          [birth year]    REFERRING PHYSICIAN:  Marne A. Tower, MD   REASON FOR CONSULTATION:  I was asked by Dr. Audrie Gallus. Tower to consult  on Ms. Hulen Luster for some chest discomfort.   HISTORY OF PRESENT ILLNESS:  Ms. Rowe is a delightful 77 year old  white female who several months ago woke in the middle of the night with  a sharp stabbing pain substernally.  She felt like her heart was racing.  It went into her left shoulder.  She did not have any shortness of  breath, diaphoresis or nausea or vomiting.  She has had nothing since  that time.  She is very active and does a lot of work at home.  She has  had no exertional chest discomfort or angina.   Her cardiac risk factors include age, sex and hypertension.  Her lipids  were checked the other day and she has an LDL of 137, HDL almost 60 and  a total cholesterol of 232.  Her total cholesterol to HDL ratio was 3.9.  She does not smoke.  She does not have a history of  diabetes.  She is  not overweight. She is very active.   ALLERGIES:  FOSAMAX, ACTONEL, SULFONAMIDES AND MIRALAX.   CURRENT MEDICATIONS:  1. Hydrochlorothiazide 25 mg daily.  2. Levoxyl 75 mcg daily.  3. Diazepam 10 mg twice daily.  4. Imipramine 25 mg twice daily.  5. Fexofenadine 180 mg q.a.m.  6. Flax seed oil.  7. Vitamin D.  8. Fish oil.  9. Digestive Advantage.  10.Advair p.r.n.  11.Tylenol p.r.n.   PAST SURGICAL HISTORY:  None.   FAMILY HISTORY:  Negative for premature coronary artery disease .  Her  brother died of a sudden heart attack, but he was age 90.   SOCIAL HISTORY:  She is married.  She has two children.  She is retired.  She used to do housekeeping.   REVIEW OF SYSTEMS:  She has a history of seasonal allergies.  She  has  problems with constipation and fatigue.  She has chronic arthritis.  She  has thyroid disease and is on replacement therapy.  She has a history of  anxiety and depression.  As she puts it, I worry all the time.   PHYSICAL EXAMINATION:  VITAL SIGNS:  Blood pressure is 120/74, pulse 84  and regular.  Her electrocardiogram is normal.  She is 5 feet 4 inches,  weight 160 pounds.  HEENT:  Normocephalic and atraumatic.  Pupils equal, round, reactive to  light and accommodation.  Extraocular movements intact.  Sclerae clear.  Facial symmetry is normal.  NECK:  Carotids are full.  There is no bruit.  Thyroid is not enlarged.  Trachea midline.  LUNGS:  Clear.  There is no rub.  HEART:  PMI is not displaced.  She has normal S1 and S2 without  rub or  gallop.  ABDOMEN:  Soft, good bowel sounds.  No midline bruit, no tenderness.  EXTREMITIES:  No clubbing, cyanosis or edema.  No sign of deep venous  thrombosis.  She has some varicose veins.  Pulses intact.  NEUROLOGIC:  Intact.   ASSESSMENT:  1. Non-cardiac chest pain.  2. Cardiac risk factors are age, mild hyperlipidemia, though very good      total cholesterol to high-density lipoprotein ratio and      hypertension, that seems to be under good control.   I had a long talk with Ms. Su Hilt today.  I have gone over the symptoms  of coronary ischemia or angina, as well as an acute coronary syndrome.  I have given her instructions on how to respond to each.  At this point  in time I have reassured her that I do not feel any further assessment  is necessary.  I will see her back on a p.r.n. basis.     Thomas C. Daleen Squibb, MD, [facility]  Electronically Signed    TCW/MedQ  DD: 03/22/2007  DT: 03/22/2007  Job #: [id number]

## 2010-09-23 ENCOUNTER — Other Ambulatory Visit: Payer: Self-pay | Admitting: Family Medicine

## 2010-09-23 NOTE — Telephone Encounter (Signed)
Medication phoned to [facility] [address] pharmacy as instructed.

## 2010-09-23 NOTE — Telephone Encounter (Signed)
Px written for call in   

## 2010-09-23 NOTE — Telephone Encounter (Signed)
Not my patient..forward to Dr. Milinda Antis

## 2010-09-29 ENCOUNTER — Ambulatory Visit
Admission: RE | Admit: 2010-09-29 | Discharge: 2010-09-29 | Disposition: A | Discharge status: Home / Self Care | Payer: MEDICARE | Source: Ambulatory Visit | Attending: Family Medicine | Admitting: Family Medicine

## 2010-09-29 DIAGNOSIS — Z1231 Encounter for screening mammogram for malignant neoplasm of breast: Secondary | ICD-10-CM

## 2010-10-08 ENCOUNTER — Encounter: Payer: Self-pay | Admitting: Family Medicine

## 2010-10-08 ENCOUNTER — Other Ambulatory Visit: Payer: Self-pay | Admitting: Family Medicine

## 2010-10-08 NOTE — Telephone Encounter (Signed)
Pt already has appt scheduled with Dr Milinda Antis 12/15/10.

## 2010-10-13 ENCOUNTER — Encounter: Payer: Self-pay | Admitting: Family Medicine

## 2010-10-13 ENCOUNTER — Encounter: Payer: Self-pay | Admitting: *Deleted

## 2010-12-10 ENCOUNTER — Ambulatory Visit: Payer: MEDICARE | Admitting: Family Medicine

## 2010-12-15 ENCOUNTER — Ambulatory Visit (INDEPENDENT_AMBULATORY_CARE_PROVIDER_SITE_OTHER): Payer: MEDICARE | Admitting: Family Medicine

## 2010-12-15 ENCOUNTER — Encounter: Payer: Self-pay | Admitting: Family Medicine

## 2010-12-15 DIAGNOSIS — I10 Essential (primary) hypertension: Secondary | ICD-10-CM

## 2010-12-15 DIAGNOSIS — E785 Hyperlipidemia, unspecified: Secondary | ICD-10-CM

## 2010-12-15 DIAGNOSIS — E039 Hypothyroidism, unspecified: Secondary | ICD-10-CM

## 2010-12-15 DIAGNOSIS — Z0000 Encounter for general adult medical examination without abnormal findings: Secondary | ICD-10-CM

## 2010-12-15 LAB — COMPREHENSIVE METABOLIC PANEL
Albumin: 3.7 g/dL (ref 3.5–5.2)
Alkaline Phosphatase: 90 U/L (ref 39–117)
CO2: 32 mEq/L (ref 19–32)
Glucose, Bld: 81 mg/dL (ref 70–99)
Potassium: 3.6 mEq/L (ref 3.5–5.1)
Sodium: 135 mEq/L (ref 135–145)
Total Protein: 6.5 g/dL (ref 6.0–8.3)

## 2010-12-15 LAB — CBC WITH DIFFERENTIAL/PLATELET
Basophils Absolute: 0 10*3/uL (ref 0.0–0.1)
Eosinophils Absolute: 0.2 10*3/uL (ref 0.0–0.7)
HCT: 39.8 % (ref 36.0–46.0)
Lymphs Abs: 1.8 10*3/uL (ref 0.7–4.0)
MCHC: 33.9 g/dL (ref 30.0–36.0)
Monocytes Absolute: 0.9 10*3/uL (ref 0.1–1.0)
Monocytes Relative: 13.8 % — ABNORMAL HIGH (ref 3.0–12.0)
Platelets: 238 10*3/uL (ref 150.0–400.0)
RDW: 13.6 % (ref 11.5–14.6)

## 2010-12-15 LAB — TSH: TSH: 1.62 u[IU]/mL (ref 0.35–5.50)

## 2010-12-15 LAB — LIPID PANEL
Cholesterol: 150 mg/dL (ref 0–200)
VLDL: 14.6 mg/dL (ref 0.0–40.0)

## 2010-12-15 NOTE — Assessment & Plan Note (Signed)
With zocor and good diet has been well controlled Rev last labs Rev low sat fat diet in detail  Lab today and adv F/u 6 mo

## 2010-12-15 NOTE — Assessment & Plan Note (Signed)
Some fatigue- otherwise no clinical change tsh today and update  F/u 6 mo

## 2010-12-15 NOTE — Assessment & Plan Note (Signed)
This is well controlled on hct and cozaar Labs for HTN today Disc low sodium diet and exercise

## 2010-12-15 NOTE — Patient Instructions (Signed)
You need Tdap vaccine to prevent pertussis -- but medicare does not pay - but your bcbs may? -- call and see- if not you have no coverage go to the health dept for it  Keep watching diet and stay active  Labs today  Follow up in 6 months

## 2010-12-15 NOTE — Progress Notes (Signed)
Subjective:    Patient ID: Brooke Beasley, female    DOB: 01/21/1931, 77 y.o.   MRN: [id number]  HPI Here for f/u of HTN and hypothyroid and lipids   Has felt generally tired all the time Is taking thyroid med    Had zostavax in dec  Wt is down 7 lb with bmi of 29 Eats less in the summer  Still has hot flashes   On zocor for lipids Lab Results  Component Value Date   CHOL 180 09/19/2009   CHOL 154 03/07/2009   CHOL 231* 01/23/2009   Lab Results  Component Value Date   HDL 88.40 09/19/2009   HDL 91.47 03/07/2009   HDL 65.60 01/23/2009   Lab Results  Component Value Date   LDLCALC 74 09/19/2009   LDLCALC 62 03/07/2009   Lab Results  Component Value Date   TRIG 87.0 09/19/2009   TRIG 105.0 03/07/2009   TRIG 144.0 01/23/2009   Lab Results  Component Value Date   CHOLHDL 2 09/19/2009   CHOLHDL 2 03/07/2009   CHOLHDL 4 01/23/2009   Lab Results  Component Value Date   LDLDIRECT 143.8 01/23/2009   LDLDIRECT 134.3 10/09/2008   LDLDIRECT 125.0 02/29/2008   was fairly controlled   HTN in good control  130/76 today No ha or edema or palpitations or cp  Exercise  Last tsh in may of 11 was theraputic  Is tired in general  No dry skin or hair loss  clinically  Patient Active Problem List  Diagnoses  . HYPOTHYROIDISM  . HYPERLIPIDEMIA  . ANXIETY  . DEPRESSION  . GLAUCOMA  . HYPERTENSION, BENIGN  . HEMORRHOIDS, INTERNAL  . CONSTIPATION, CHRONIC  . IBS  . NIPPLE DISCHARGE  . OSTEOARTHRITIS  . OSTEOPOROSIS  . EDEMA  . ALLERGY  . Preventative health care   Past Medical History  Diagnosis Date  . Osteoarthritis   . Anxiety   . Depression   . Hyperlipidemia   . Thyroid disease     Hypothyroidism  . Osteoporosis   . History of shingles     In past  . Intestinal bacterial overgrowth     In small colon  . Hypertension   . Degenerative disc disease     LS   Past Surgical History  Procedure Date  . Tonsillectomy   . Bowel obstruction 1999  .  Colonoscopy 1. 1999  2. 11/04    1. Not finished  2. Slight hemorrhage rectosigmoid area  . Dexa 1. 8295-6213   2. 9/04  3. 3/08     1. OP  2. OP, borderline, spine -2.44T  3. decreased BMD-OP  . Diverticulosis   . Esophagogastroduodenoscopy     Negative  . Hemorrhoid procedure 4/08  . Dg knee 1-2 views bilat     LS x-ray with degenerative disc and facet change   History  Substance Use Topics  . Smoking status: Never Smoker   . Smokeless tobacco: Not on file  . Alcohol Use:    Family History  Problem Relation Age of Onset  . Diabetes Brother   . Coronary artery disease Brother    Allergies  Allergen Reactions  . Ace Inhibitors     REACTION: cough  . Alendronate Sodium     REACTION: palpitations  . Risedronate Sodium     REACTION: joint pain  . Simvastatin     REACTION: the full 20 mg pill causes leg pain- can tol 10 mg  . Sulfonamide Derivatives  REACTION: rash   Current Outpatient Prescriptions on File Prior to Visit  Medication Sig Dispense Refill  . Bimatoprost (LUMIGAN) 0.01 % SOLN Apply to eye. Put one drop in both eyes at night.       . chlordiazePOXIDE (LIBRIUM) 10 MG capsule TAKE ONE CAPSULE BY MOUTH TWICE DAILY  60 capsule  3  . fexofenadine (ALLEGRA) 180 MG tablet Take 180 mg by mouth daily. Take as needed.       . fish oil-omega-3 fatty acids 1000 MG capsule Take 1 g by mouth daily.        . hydrochlorothiazide 25 MG tablet TAKE 1 TABLET DAILY  90 tablet  2  . imipramine (TOFRANIL) 25 MG tablet TAKE 1 TABLET TWICE A DAY  180 tablet  0  . losartan (COZAAR) 50 MG tablet Take 75 mg by mouth daily.        . polyethylene glycol powder (MIRALAX) powder Take by mouth. OTC as directed.       . simvastatin (ZOCOR) 20 MG tablet TAKE ONE-HALF (1/2) TABLET AT BEDTIME  45 tablet  2  . zoster vaccine live, PF, (ZOSTAVAX) 16109 UNT/0.65ML injection Inject into the skin. Inject times one as directed.       Marland Kitchen azelastine (ASTELIN) 137 MCG/SPRAY nasal spray Place 1 spray  into the nose as needed. Use in each nostril as directed       . Flaxseed, Linseed, (FLAXSEED OIL) 1000 MG CAPS Take 1,000 mg by mouth daily.        . Garlic 400 MG TBEC Take 400 mg by mouth daily.        Marland Kitchen levothyroxine (SYNTHROID, LEVOTHROID) 75 MCG tablet Take 75 mcg by mouth daily.        . NON FORMULARY Netty pot. OTC as directed.       . Psyllium (METAMUCIL) 30.9 % POWD Take by mouth. OTC as directed.          Review of Systems Review of Systems  Constitutional: Negative for fever, appetite change, fatigue and unexpected weight change.  Eyes: Negative for pain and visual disturbance.  Respiratory: Negative for cough and shortness of breath.   Cardiovascular: Negative. For cp or palpitations  Gastrointestinal: Negative for nausea, diarrhea and constipation.  Genitourinary: Negative for urgency and frequency.  Skin: Negative for pallor. or rash  Neurological: Negative for weakness, light-headedness, numbness and headaches.  Hematological: Negative for adenopathy. Does not bruise/bleed easily.  Psychiatric/Behavioral: Negative for dysphoric mood. The patient is not nervous/anxious.          Objective:   Physical Exam  Constitutional: She appears well-developed and well-nourished. No distress.       overwt and well appearing   HENT:  Head: Normocephalic and atraumatic.  Right Ear: External ear normal.  Left Ear: External ear normal.  Nose: Nose normal.  Mouth/Throat: Oropharynx is clear and moist.  Eyes: Conjunctivae and EOM are normal. Pupils are equal, round, and reactive to light.  Neck: Normal range of motion. Neck supple. No JVD present. Carotid bruit is not present. No thyromegaly present.  Cardiovascular: Normal rate, regular rhythm, normal heart sounds and intact distal pulses.   Pulmonary/Chest: Effort normal and breath sounds normal. No respiratory distress. She has no wheezes. She exhibits no tenderness.  Abdominal: Soft. Bowel sounds are normal. She exhibits no  distension, no abdominal bruit and no mass. There is no tenderness.  Musculoskeletal: She exhibits no edema and no tenderness.  Lymphadenopathy:    She has no  cervical adenopathy.  Neurological: She is alert. She has normal reflexes. No cranial nerve deficit. Coordination normal.  Skin: Skin is warm and dry. No rash noted. No erythema. No pallor.  Psychiatric: She has a normal mood and affect.          Assessment & Plan:

## 2010-12-15 NOTE — Assessment & Plan Note (Signed)
We disc imm for pertussis today Is around babies at [employer] I recommend Tdap vaccine  Medicare does not pay Pt will investigate coverage from her supplement or getting it at the health dept

## 2011-03-22 ENCOUNTER — Ambulatory Visit (INDEPENDENT_AMBULATORY_CARE_PROVIDER_SITE_OTHER): Payer: MEDICARE | Admitting: Family Medicine

## 2011-03-22 ENCOUNTER — Encounter: Payer: Self-pay | Admitting: Family Medicine

## 2011-03-22 ENCOUNTER — Ambulatory Visit: Payer: MEDICARE | Admitting: Family Medicine

## 2011-03-22 VITALS — BP 128/80 | HR 72 | Temp 98.2°F | Wt 159.5 lb

## 2011-03-22 DIAGNOSIS — M542 Cervicalgia: Secondary | ICD-10-CM | POA: Insufficient documentation
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
## 2011-05-24 ENCOUNTER — Other Ambulatory Visit: Payer: Self-pay | Admitting: Family Medicine

## 2011-05-24 NOTE — Telephone Encounter (Signed)
[facility] [address] request refill Librium 10 mg. Pt last seen by Dr Reece Agar 03/22/11 and saw Dr Milinda Antis 12/15/10.Please advise.

## 2011-05-24 NOTE — Telephone Encounter (Signed)
Px written for call in   

## 2011-05-25 ENCOUNTER — Other Ambulatory Visit: Payer: Self-pay | Admitting: Family Medicine

## 2011-05-25 NOTE — Telephone Encounter (Signed)
Medication phoned to [facility] [address] pharmacy as instructed.  

## 2011-05-26 NOTE — Telephone Encounter (Signed)
rx called in

## 2011-06-15 ENCOUNTER — Encounter: Payer: Self-pay | Admitting: Family Medicine

## 2011-06-15 ENCOUNTER — Ambulatory Visit (INDEPENDENT_AMBULATORY_CARE_PROVIDER_SITE_OTHER): Payer: MEDICARE | Admitting: Family Medicine

## 2011-06-15 VITALS — BP 124/84 | HR 80 | Temp 97.3°F | Ht 61.5 in | Wt 153.5 lb

## 2011-06-15 DIAGNOSIS — E785 Hyperlipidemia, unspecified: Secondary | ICD-10-CM

## 2011-06-15 DIAGNOSIS — I10 Essential (primary) hypertension: Secondary | ICD-10-CM

## 2011-06-15 DIAGNOSIS — E039 Hypothyroidism, unspecified: Secondary | ICD-10-CM

## 2011-06-15 DIAGNOSIS — F411 Generalized anxiety disorder: Secondary | ICD-10-CM

## 2011-06-15 DIAGNOSIS — F329 Major depressive disorder, single episode, unspecified: Secondary | ICD-10-CM

## 2011-06-15 MED ORDER — IMIPRAMINE HCL 25 MG PO TABS: 25.0000 mg | ORAL_TABLET | Freq: Two times a day (BID) | ORAL | Status: DC

## 2011-06-15 MED ORDER — LOSARTAN POTASSIUM 50 MG PO TABS: ORAL_TABLET | ORAL | Status: DC

## 2011-06-15 MED ORDER — HYDROCHLOROTHIAZIDE 25 MG PO TABS: 25.0000 mg | ORAL_TABLET | Freq: Every day | ORAL | Status: DC

## 2011-06-15 MED ORDER — SIMVASTATIN 20 MG PO TABS: 10.0000 mg | ORAL_TABLET | Freq: Every day | ORAL | Status: DC

## 2011-06-15 MED ORDER — LEVOTHYROXINE SODIUM 75 MCG PO TABS: 75.0000 ug | ORAL_TABLET | Freq: Every day | ORAL | Status: DC

## 2011-06-15 NOTE — Assessment & Plan Note (Signed)
Rev stable tsh last check and no clinical changes No change in dose

## 2011-06-15 NOTE — Patient Instructions (Signed)
Follow up in 6 months for annual exam with labs prior Here are printed px  Keep working on healthy diet and exercise and weight loss

## 2011-06-15 NOTE — Assessment & Plan Note (Signed)
bp in fair control at this time  No changes needed  Disc lifstyle change with low sodium diet and exercise  Med refilled  

## 2011-06-15 NOTE — Assessment & Plan Note (Signed)
Pt does well with librium and has taken for years - no changes  Ins does not cover- she pays out of pocket

## 2011-06-15 NOTE — Assessment & Plan Note (Signed)
Fairly well controlled on zocor and diet Disc goals for lipids and reasons to control them Rev labs with pt Rev low sat fat diet in detail
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
## 2011-06-15 NOTE — Assessment & Plan Note (Signed)
Tofranil works well - in past failed paxil Will continue present dose / refil  Pt declines counseling for stress at this time

## 2011-07-19 ENCOUNTER — Telehealth: Payer: Self-pay

## 2011-07-19 NOTE — Telephone Encounter (Signed)
Pt said on 09/16/11 had pressure in head, dizzy and passed out when woke up was sweating. No chest pain. Again this morning pt had dizziness and passed out again with head feeling tight and sweating. Pt said grandchildren had been sick with fever, chills and scratchy throat and pt has those symptoms also. Pt does not want to go to ER.  Dr Milinda Antis said pt may need fluids and CT scan and needs to go to ER. Pt s husband is at home with her and will take her to ER. Pt not sure which ER she will go to.

## 2011-07-19 NOTE — Telephone Encounter (Signed)
I agree with plan to go to emergency room - will wait for records

## 2011-07-26 HISTORY — PX: US ECHOCARDIOGRAPHY: HXRAD669

## 2011-08-02 ENCOUNTER — Encounter: Payer: Self-pay | Admitting: Family Medicine

## 2011-08-02 ENCOUNTER — Ambulatory Visit (INDEPENDENT_AMBULATORY_CARE_PROVIDER_SITE_OTHER): Payer: MEDICARE | Admitting: Family Medicine

## 2011-08-02 ENCOUNTER — Telehealth: Payer: Self-pay | Admitting: Family Medicine

## 2011-08-02 VITALS — BP 136/82 | HR 58 | Temp 98.1°F | Wt 158.8 lb

## 2011-08-02 DIAGNOSIS — J019 Acute sinusitis, unspecified: Secondary | ICD-10-CM | POA: Insufficient documentation

## 2011-08-02 DIAGNOSIS — R55 Syncope and collapse: Secondary | ICD-10-CM | POA: Insufficient documentation

## 2011-08-02 DIAGNOSIS — R011 Cardiac murmur, unspecified: Secondary | ICD-10-CM

## 2011-08-02 DIAGNOSIS — I499 Cardiac arrhythmia, unspecified: Secondary | ICD-10-CM

## 2011-08-02 MED ORDER — FLUTICASONE PROPIONATE 50 MCG/ACT NA SUSP: 2.0000 | Freq: Every day | NASAL | Status: DC

## 2011-08-02 NOTE — Telephone Encounter (Signed)
Please notify pt that reviewing chart, I dont' see any echo done in past, however did hear murmur on exam today - would like to set her up with ultrasound of heart to further evaluate this.  Placed order in chart.

## 2011-08-02 NOTE — Assessment & Plan Note (Addendum)
EKG today - overall benign - frequent PACs. Pt did not go to ER as directed at last phone note. Discussed differential of syncope and how this can be due to life threatening conditions including stroke and cardiac disease. Episode from 2 wks ago sounds consistent with vasovagal during sinusitis however. Recent change in generic mail order levothyroxine, to return in 6 wks for TSH and other blood work, placed orders in chart. Has f/u appt with PCP next month. Lab Results  Component Value Date   TSH 1.62 12/15/2010   Cardiac murmur heard today - in h/o syncope, and no recent echo in chart will order echo to further assess.

## 2011-08-02 NOTE — Patient Instructions (Addendum)
Return after you've been on levothyroxine for 4-6 weeks to recheck thyroid. EKG today - overall ok.Dineen Kid getting over the sinus infection Take medicine as prescribed: flonase nasal steroid. Push fluids and plenty of rest. Nasal saline irrigation or neti pot to help drain sinuses. May use simple mucinex with plenty of fluid to help mobilize mucous. Let us know if fever >101.5, trouble opening/closing mouth, difficulty swallowing, or worsening - you may need to be seen again.

## 2011-08-02 NOTE — Assessment & Plan Note (Signed)
Getting over acute sinusitis, treated with cefdinir.   Currently only clear mucous, continued nasal congestion however. rec treat with flonase and continued neti pot, mucinex. If not better or any worsening ot return for further evaluation.

## 2011-08-02 NOTE — Progress Notes (Signed)
Subjective:    Patient ID: Brooke Beasley, female    DOB: Mar 03, 1931, 78 y.o.   MRN: [id number]  HPI CC: congestion  Sick 2 wks ago, then Saturday passed out.  Wanted to come here but told to go to ER.  Instead went to [facility], and prescribed cefdinir for presumed sinusitis, finished 10 day course last Wednesday.  Some better, but still not feeling fully better.  Still feeling tired, head stopped up, congestion and lump in throat.  + mild cough and PNDrainage.  + mild HA.  When blows nose clearing mucous.  No more syncope.  So far has tried tylenol and cefdinir.  Also took mucinex and robitussin DM.  Also taking allegra.  Using neti pot in mornings.  No fevers/chills, abd pain, n/v, d/c, ear or tooth pain, rashes.  + sick contacts at home - grandsons.  No smokers at home.  No h/o COPD.  Endorses h/o asthma.  Did feel lightheaded and weak prior to passing out, sweaty afterwards.  Lives with husband, fall not noted.  Hit bruised R arm but no head trauma.  Denies CP/tightness, SOB, palpitations.  H/o syncope when younger, usually when ill.  Recent change in mail order hypothryoid med from levoxyl to levothyroxine, both generics.  Past Medical History  Diagnosis Date  . Osteoarthritis   . Anxiety   . Depression   . Hyperlipidemia   . Thyroid disease     Hypothyroidism  . Osteoporosis   . History of shingles     In past  . Intestinal bacterial overgrowth     In small colon  . Hypertension   . Degenerative disc disease     LS   Past Surgical History  Procedure Date  . Tonsillectomy   . Bowel obstruction 1999  . Colonoscopy 1. 1999  2. 11/04    1. Not finished  2. Slight hemorrhage rectosigmoid area  . Dexa 1. 4098-1191   2. 9/04  3. 3/08     1. OP  2. OP, borderline, spine -2.44T  3. decreased BMD-OP  . Diverticulosis   . Esophagogastroduodenoscopy     Negative  . Hemorrhoid procedure 4/08  . Dg knee 1-2 views bilat     LS x-ray with degenerative disc and facet change      Review of Systems Per HPI    Objective:   Physical Exam  Nursing note and vitals reviewed. Constitutional: She appears well-developed and well-nourished. No distress.  HENT:  Head: Normocephalic and atraumatic.  Right Ear: Hearing, tympanic membrane, external ear and ear canal normal.  Left Ear: Hearing, tympanic membrane, external ear and ear canal normal.  Nose: No mucosal edema or rhinorrhea. Right sinus exhibits no maxillary sinus tenderness and no frontal sinus tenderness. Left sinus exhibits no maxillary sinus tenderness and no frontal sinus tenderness.  Mouth/Throat: Uvula is midline and mucous membranes are normal. Posterior oropharyngeal erythema present. No oropharyngeal exudate, posterior oropharyngeal edema or tonsillar abscesses.       + PNdrainage, white.  Eyes: Conjunctivae and EOM are normal. Pupils are equal, round, and reactive to light. No scleral icterus.  Neck: Normal range of motion. Neck supple. Carotid bruit is not present.  Cardiovascular: Normal rate and intact distal pulses.  An irregular rhythm present.  Murmur (3/6 SEM best at LUSB) heard. Pulmonary/Chest: Effort normal and breath sounds normal. No respiratory distress. She has no wheezes. She has no rales.  Musculoskeletal: She exhibits no edema.  Lymphadenopathy:    She has no  cervical adenopathy.  Skin: Skin is warm and dry. No rash noted.  Psychiatric: She has a normal mood and affect.       Assessment & Plan:

## 2011-08-03 NOTE — Telephone Encounter (Signed)
Patient notified. She will expect call from [facility] to set up Echo.

## 2011-08-05 ENCOUNTER — Telehealth: Payer: Self-pay | Admitting: Family Medicine

## 2011-08-05 DIAGNOSIS — R011 Cardiac murmur, unspecified: Secondary | ICD-10-CM

## 2011-08-05 DIAGNOSIS — R55 Syncope and collapse: Secondary | ICD-10-CM

## 2011-08-05 NOTE — Telephone Encounter (Signed)
Pt's order was put in as a 2D Echo with Contrast. Imaging Center says they usually use 2D Echo without (Order # [id number]). She said they do use 2D Echo with contrast but usually only with a Bubble study.

## 2011-08-05 NOTE — Telephone Encounter (Signed)
Placed new order. Thanks

## 2011-08-17 ENCOUNTER — Other Ambulatory Visit: Payer: Self-pay

## 2011-08-17 ENCOUNTER — Ambulatory Visit (HOSPITAL_COMMUNITY): Payer: Medicare Other | Attending: Cardiology

## 2011-08-17 DIAGNOSIS — I059 Rheumatic mitral valve disease, unspecified: Secondary | ICD-10-CM | POA: Insufficient documentation

## 2011-08-17 DIAGNOSIS — R55 Syncope and collapse: Secondary | ICD-10-CM | POA: Insufficient documentation

## 2011-08-17 DIAGNOSIS — E785 Hyperlipidemia, unspecified: Secondary | ICD-10-CM | POA: Insufficient documentation

## 2011-08-17 DIAGNOSIS — I519 Heart disease, unspecified: Secondary | ICD-10-CM | POA: Insufficient documentation

## 2011-08-17 DIAGNOSIS — R011 Cardiac murmur, unspecified: Secondary | ICD-10-CM | POA: Insufficient documentation

## 2011-08-19 ENCOUNTER — Encounter: Payer: Self-pay | Admitting: Family Medicine

## 2011-08-25 DIAGNOSIS — R011 Cardiac murmur, unspecified: Secondary | ICD-10-CM

## 2011-08-25 HISTORY — DX: Cardiac murmur, unspecified: R01.1

## 2011-08-31 ENCOUNTER — Other Ambulatory Visit: Payer: MEDICARE

## 2011-08-31 ENCOUNTER — Other Ambulatory Visit: Payer: Self-pay | Admitting: Dermatology

## 2011-09-06 ENCOUNTER — Other Ambulatory Visit (INDEPENDENT_AMBULATORY_CARE_PROVIDER_SITE_OTHER): Payer: Medicare Other

## 2011-09-06 DIAGNOSIS — I10 Essential (primary) hypertension: Secondary | ICD-10-CM

## 2011-09-06 DIAGNOSIS — R55 Syncope and collapse: Secondary | ICD-10-CM

## 2011-09-06 LAB — CBC WITH DIFFERENTIAL/PLATELET
Basophils Absolute: 0 10*3/uL (ref 0.0–0.1)
Basophils Relative: 0.5 % (ref 0.0–3.0)
Eosinophils Absolute: 0.3 10*3/uL (ref 0.0–0.7)
Lymphocytes Relative: 30 % (ref 12.0–46.0)
MCHC: 33.6 g/dL (ref 30.0–36.0)
Neutrophils Relative %: 52.7 % (ref 43.0–77.0)
RBC: 4.69 Mil/uL (ref 3.87–5.11)
RDW: 13.3 % (ref 11.5–14.6)

## 2011-09-06 LAB — BASIC METABOLIC PANEL
Chloride: 95 mEq/L — ABNORMAL LOW (ref 96–112)
Potassium: 3.7 mEq/L (ref 3.5–5.1)

## 2011-09-06 LAB — TSH: TSH: 0.82 u[IU]/mL (ref 0.35–5.50)

## 2011-09-13 ENCOUNTER — Encounter: Payer: Self-pay | Admitting: Family Medicine

## 2011-09-13 ENCOUNTER — Ambulatory Visit (INDEPENDENT_AMBULATORY_CARE_PROVIDER_SITE_OTHER): Payer: MEDICARE | Admitting: Family Medicine

## 2011-09-13 VITALS — BP 126/80 | HR 60 | Temp 98.2°F | Ht 61.5 in | Wt 156.8 lb

## 2011-09-13 DIAGNOSIS — R011 Cardiac murmur, unspecified: Secondary | ICD-10-CM

## 2011-09-13 DIAGNOSIS — E039 Hypothyroidism, unspecified: Secondary | ICD-10-CM

## 2011-09-13 DIAGNOSIS — I10 Essential (primary) hypertension: Secondary | ICD-10-CM

## 2011-09-13 DIAGNOSIS — I38 Endocarditis, valve unspecified: Secondary | ICD-10-CM | POA: Insufficient documentation

## 2011-09-13 DIAGNOSIS — I340 Nonrheumatic mitral (valve) insufficiency: Secondary | ICD-10-CM | POA: Insufficient documentation

## 2011-09-13 DIAGNOSIS — R55 Syncope and collapse: Secondary | ICD-10-CM

## 2011-09-13 NOTE — Progress Notes (Signed)
Subjective:    Patient ID: Brooke Beasley, female    DOB: 1930/07/07, 78 y.o.   MRN: [id number]  HPI Here for f/u of HTN and also syncope  Last visit - saw Dr Reece Agar- had syncopal episode with sinusitis  Sounded vasovagal Heart M Echo done -- nl EF 55-60% and mild MR  No more fainting episodes  She really thinks it was the illness that made her faint (grandson had the same thing -- he passed out too)   No longer sick  Is back to normal   Hypothyroid Lab Results  Component Value Date   TSH 0.82 09/06/2011   had to change to generic-- was worried about that   Wt is down 2 lb with bmi of 29  Gluc 105 Eating more sweets lately as she gets older   bp is  Good    Today BP Readings from Last 3 Encounters:  09/13/11 126/80  08/02/11 136/82  06/15/11 124/84    No cp or palpitations or headaches or edema  No side effects to medicines    Is newly on lamasil for fingernail fungus  Patient Active Problem List  Diagnoses  . HYPOTHYROIDISM  . HYPERLIPIDEMIA  . ANXIETY  . DEPRESSION  . GLAUCOMA  . HYPERTENSION, BENIGN  . HEMORRHOIDS, INTERNAL  . CONSTIPATION, CHRONIC  . IBS  . NIPPLE DISCHARGE  . OSTEOARTHRITIS  . OSTEOPOROSIS  . EDEMA  . ALLERGY  . Preventative health care  . Neck pain  . Syncope   Past Medical History  Diagnosis Date  . Osteoarthritis   . Anxiety   . Depression   . Hyperlipidemia   . Thyroid disease     Hypothyroidism  . Osteoporosis   . History of shingles     In past  . Intestinal bacterial overgrowth     In small colon  . Hypertension   . Degenerative disc disease     LS   Past Surgical History  Procedure Date  . Tonsillectomy   . Bowel obstruction 1999  . Colonoscopy 1. 1999  2. 11/04    1. Not finished  2. Slight hemorrhage rectosigmoid area  . Dexa 1. 7829-5621   2. 9/04  3. 3/08     1. OP  2. OP, borderline, spine -2.44T  3. decreased BMD-OP  . Diverticulosis   . Esophagogastroduodenoscopy     Negative  . Hemorrhoid procedure  4/08  . Dg knee 1-2 views bilat     LS x-ray with degenerative disc and facet change  . US echocardiography 07/2011    Normal sys fxn with EF 55-60%.  Focal basal septal hypertrophy.  Mild diastolic dysfunction.  Mild MR.   History  Substance Use Topics  . Smoking status: Never Smoker   . Smokeless tobacco: Not on file  . Alcohol Use: No   Family History  Problem Relation Age of Onset  . Diabetes Brother   . Coronary artery disease Brother    Allergies  Allergen Reactions  . Ace Inhibitors     REACTION: cough  . Alendronate Sodium     REACTION: palpitations  . Paroxetine Hcl     Not effective   . Risedronate Sodium     REACTION: joint pain  . Simvastatin     REACTION: the full 20 mg pill causes leg pain- can tol 10 mg  . Sulfonamide Derivatives     REACTION: rash   Current Outpatient Prescriptions on File Prior to Visit  Medication Sig Dispense  Refill  . azelastine (ASTELIN) 137 MCG/SPRAY nasal spray Place 1 spray into the nose as needed. Use in each nostril as directed       . Bimatoprost (LUMIGAN) 0.01 % SOLN Apply to eye. Put one drop in both eyes at night.       . chlordiazePOXIDE (LIBRIUM) 10 MG capsule TAKE ONE CAPSULE BY MOUTH TWICE DAILY  60 capsule  3  . fexofenadine (ALLEGRA) 180 MG tablet Take 180 mg by mouth daily. Take as needed.       . fish oil-omega-3 fatty acids 1000 MG capsule Take 1 g by mouth daily.        . Flaxseed, Linseed, (FLAXSEED OIL) 1000 MG CAPS Take 1,000 mg by mouth daily.        . fluticasone (FLONASE) 50 MCG/ACT nasal spray Place 2 sprays into the nose daily.  16 g  1  . Garlic 400 MG TBEC Take 400 mg by mouth daily.        . hydrochlorothiazide (HYDRODIURIL) 25 MG tablet Take 1 tablet (25 mg total) by mouth daily.  90 tablet  3  . imipramine (TOFRANIL) 25 MG tablet Take 1 tablet (25 mg total) by mouth 2 (two) times daily.  180 tablet  3  . levothyroxine (SYNTHROID, LEVOTHROID) 75 MCG tablet Take 1 tablet (75 mcg total) by mouth daily.  90  tablet  3  . losartan (COZAAR) 50 MG tablet Take 11/2 tabs by mouth daily  135 tablet  3  . NON FORMULARY Netty pot. OTC as directed.       . polyethylene glycol powder (MIRALAX) powder Take by mouth. OTC as directed.       . simvastatin (ZOCOR) 20 MG tablet Take 0.5 tablets (10 mg total) by mouth daily.  45 tablet  3     Review of Systems    Review of Systems  Constitutional: Negative for fever, appetite change, fatigue and unexpected weight change. --feeling much better Eyes: Negative for pain and visual disturbance.  Respiratory: Negative for cough and shortness of breath.   Cardiovascular: Negative for cp or palpitations    Gastrointestinal: Negative for nausea, diarrhea and constipation.  Genitourinary: Negative for urgency and frequency.  Skin: Negative for pallor or rash  \ MSK pos for joint pain in hands from OA  Neurological: Negative for weakness, light-headedness, numbness and headaches.  Hematological: Negative for adenopathy. Does not bruise/bleed easily.  Psychiatric/Behavioral: Negative for dysphoric mood. The patient is not nervous/anxious.      Objective:   Physical Exam  Constitutional: She appears well-developed and well-nourished. No distress.  HENT:  Head: Normocephalic and atraumatic.  Right Ear: External ear normal.  Left Ear: External ear normal.  Nose: Nose normal.  Mouth/Throat: Oropharynx is clear and moist.  Eyes: Conjunctivae and EOM are normal. Pupils are equal, round, and reactive to light. No scleral icterus.  Neck: Normal range of motion. Neck supple. No JVD present. Carotid bruit is not present. No thyromegaly present.  Cardiovascular: Normal rate and regular rhythm.   Murmur heard. Pulmonary/Chest: Effort normal and breath sounds normal. No respiratory distress. She has no wheezes.  Abdominal: Soft. Bowel sounds are normal. She exhibits no distension, no abdominal bruit and no mass. There is no tenderness.  Musculoskeletal: She exhibits no  edema and no tenderness.  Lymphadenopathy:    She has no cervical adenopathy.  Neurological: She is alert. She has normal strength and normal reflexes. She displays no atrophy and no tremor. No cranial nerve  deficit or sensory deficit. She exhibits normal muscle tone. She displays a negative Romberg sign. Coordination and gait normal.  Skin: Skin is warm and dry. No rash noted. No erythema. No pallor.  Psychiatric: She has a normal mood and affect.          Assessment & Plan:

## 2011-09-13 NOTE — Assessment & Plan Note (Signed)
bp in fair control at this time  No changes needed  Disc lifstyle change with low sodium diet and exercise  No orthostatic changes Rev labs Disc cutting sweets in diet for sugar 105 and obesity

## 2011-09-13 NOTE — Assessment & Plan Note (Signed)
With change in med to generic-tsh tx Lab Results  Component Value Date   TSH 0.82 09/06/2011   No clinical changes Will stay with this dose F/u 6 mo

## 2011-09-13 NOTE — Patient Instructions (Signed)
I'm glad you are feeling better Heart test was reassuring  If any more fainting- let me know  Labs ok  Thyroid ok - continue current medicine  Sugar is borderline high- so cut back on sweets in diet  Schedule annual exam in 6 months with labs prior

## 2011-09-13 NOTE — Assessment & Plan Note (Signed)
Mitral regurg on echo- no symptoms  Rev results with pt

## 2011-09-13 NOTE — Assessment & Plan Note (Signed)
Rev hx / ekg/ echo No more episodes In retrospect- pt thinks it was illness rel Disc need for reg meals and fluids  Will update if sympt re occur Rev 2 D echo with nl EF

## 2011-09-24 ENCOUNTER — Emergency Department (HOSPITAL_COMMUNITY): Payer: Medicare Other

## 2011-09-24 ENCOUNTER — Encounter (HOSPITAL_COMMUNITY): Payer: Self-pay | Admitting: Emergency Medicine

## 2011-09-24 ENCOUNTER — Emergency Department (HOSPITAL_COMMUNITY)
Admission: EM | Admit: 2011-09-24 | Discharge: 2011-09-24 | Disposition: A | Discharge status: Home / Self Care | Payer: Medicare Other | Attending: Emergency Medicine | Admitting: Emergency Medicine

## 2011-09-24 DIAGNOSIS — M25419 Effusion, unspecified shoulder: Secondary | ICD-10-CM | POA: Insufficient documentation

## 2011-09-24 DIAGNOSIS — M79609 Pain in unspecified limb: Secondary | ICD-10-CM | POA: Insufficient documentation

## 2011-09-24 DIAGNOSIS — M25411 Effusion, right shoulder: Secondary | ICD-10-CM

## 2011-09-24 DIAGNOSIS — I10 Essential (primary) hypertension: Secondary | ICD-10-CM | POA: Insufficient documentation

## 2011-09-24 DIAGNOSIS — M1990 Unspecified osteoarthritis, unspecified site: Secondary | ICD-10-CM | POA: Insufficient documentation

## 2011-09-24 DIAGNOSIS — M810 Age-related osteoporosis without current pathological fracture: Secondary | ICD-10-CM | POA: Insufficient documentation

## 2011-09-24 DIAGNOSIS — M25519 Pain in unspecified shoulder: Secondary | ICD-10-CM | POA: Insufficient documentation

## 2011-09-24 DIAGNOSIS — E785 Hyperlipidemia, unspecified: Secondary | ICD-10-CM | POA: Insufficient documentation

## 2011-09-24 LAB — CBC
Hemoglobin: 14.2 g/dL (ref 12.0–15.0)
MCH: 31.3 pg (ref 26.0–34.0)
Platelets: 214 10*3/uL (ref 150–400)
RBC: 4.53 MIL/uL (ref 3.87–5.11)
WBC: 11.7 10*3/uL — ABNORMAL HIGH (ref 4.0–10.5)

## 2011-09-24 LAB — DIFFERENTIAL
Basophils Relative: 0 % (ref 0–1)
Eosinophils Absolute: 0.1 10*3/uL (ref 0.0–0.7)
Lymphs Abs: 1.6 10*3/uL (ref 0.7–4.0)
Monocytes Relative: 10 % (ref 3–12)
Neutro Abs: 8.7 10*3/uL — ABNORMAL HIGH (ref 1.7–7.7)
Neutrophils Relative %: 75 % (ref 43–77)

## 2011-09-24 LAB — COMPREHENSIVE METABOLIC PANEL
ALT: 14 U/L (ref 0–35)
Albumin: 3.8 g/dL (ref 3.5–5.2)
Alkaline Phosphatase: 98 U/L (ref 39–117)
BUN: 18 mg/dL (ref 6–23)
Chloride: 94 mEq/L — ABNORMAL LOW (ref 96–112)
Glucose, Bld: 119 mg/dL — ABNORMAL HIGH (ref 70–99)
Potassium: 3.4 mEq/L — ABNORMAL LOW (ref 3.5–5.1)
Sodium: 133 mEq/L — ABNORMAL LOW (ref 135–145)
Total Bilirubin: 0.5 mg/dL (ref 0.3–1.2)
Total Protein: 6.9 g/dL (ref 6.0–8.3)

## 2011-09-24 MED ORDER — HYDROCODONE-ACETAMINOPHEN 5-325 MG PO TABS
1.0000 | ORAL_TABLET | Freq: Once | ORAL | Status: AC
  Administered 2011-09-24: 1 via ORAL
  Filled 2011-09-24: qty 1

## 2011-09-24 MED ORDER — HYDROCODONE-ACETAMINOPHEN 5-325 MG PO TABS: 1.0000 | ORAL_TABLET | Freq: Four times a day (QID) | ORAL | Status: AC | PRN

## 2011-09-24 NOTE — ED Notes (Signed)
Patient transported to X-ray 

## 2011-09-24 NOTE — ED Provider Notes (Signed)
Date: 09/24/2011  Rate: 75  Rhythm: normal sinus rhythm  QRS Axis: normal  Intervals: normal  ST/T Wave abnormalities: nonspecific ST changes and inverted t waves  Conduction Disutrbances:none    Pt presents with focal right arm/shoulder pain worse with movement/palpation She can range the right shoulder but tender to palpation No warmth/edema to suggest infection/DVT Distal pulses and motor/sensory intact on right UE I doubt this is ACS given her exam    Joya Gaskins, MD 09/24/11 [phone]

## 2011-09-24 NOTE — Discharge Instructions (Signed)
You were seen and evaluated for your complaints of right shoulder and arm pains. Your x-rays today showed that you have arthritis in your shoulder with some swelling to the joints. It is recommended the use rest, ice, compression and elevation over the area to reduce swelling and pain. It is also recommended that you have close followup with your primary care provider or return to emergency room if your symptoms are not improving or if they worsen. You should also return if you develop any swelling or redness to the shoulder, fever, chills, sweats, continue nausea vomiting.    Shoulder Pain The shoulder is a ball and socket joint. The muscles and tendons (rotator cuff) are what keep the shoulder in its joint and stable. This collection of muscles and tendons holds in the head (ball) of the humerus (upper arm bone) in the fossa (cup) of the scapula (shoulder blade). Today no reason was found for your shoulder pain. Often pain in the shoulder may be treated conservatively with temporary immobilization. For example, holding the shoulder in one place using a sling for rest. Physical therapy may be needed if problems continue. HOME CARE INSTRUCTIONS   Apply ice to the sore area for 15 to 20 minutes, 3 to 4 times per day for the first 2 days. Put the ice in a plastic bag. Place a towel between the bag of ice and your skin.   If you have or were given a shoulder sling and straps, do not remove for as long as directed by your caregiver or until you see a caregiver for a follow-up examination. If you need to remove it to shower or bathe, move your arm as little as possible.   Sleep on several pillows at night to lessen swelling and pain.   Only take over-the-counter or prescription medicines for pain, discomfort, or fever as directed by your caregiver.   Keep any follow-up appointments in order to avoid any type of permanent shoulder disability or chronic pain problems.  SEEK MEDICAL CARE IF:   Pain in  your shoulder increases or new pain develops in your arm, hand, or fingers.   Your hand or fingers are colder than your other hand.   You do not obtain pain relief with the medications or your pain becomes worse.  SEEK IMMEDIATE MEDICAL CARE IF:   Your arm, hand, or fingers are numb or tingling.   Your arm, hand, or fingers are swollen, painful, or turn white or blue.   You develop chest pain or shortness of breath.  MAKE SURE YOU:   Understand these instructions.   Will watch your condition.   Will get help right away if you are not doing well or get worse.  Document Released: 01/20/2005 Document Revised: 04/01/2011 Document Reviewed: 03/27/2011 [facility] Patient Information 2012 [place], [place].    RESOURCE GUIDE  Chronic Pain Problems: Contact [facility]  [phone] Patients need to be referred by their primary care doctor.  Insufficient Money for Medicine: Contact United Way:  call "211" or [facility] 8121243376.  No Primary Care Doctor: - Call [facility] Connect  [phone] - can help you locate a primary care doctor that  accepts your insurance, provides certain services, etc. - Physician Referral Service- [phone]  Agencies that provide inexpensive medical care: - [facility]  [phone] - [facility]  [phone] - [facility]  [phone] [facility]  [phone] - Planned Parenthood  [phone] - [phone]  Child Clinic  [phone]  Medicaid-accepting [facility] Providers: - [facility]- [address]  [phone], Mon-Fri 9am-7pm, Sat 9am-1pm - [facility]- [address] [place], [place]  [phone] - [facility]- [address] [place]  [phone] [facility]- [address]  [phone] - Renaye Rakers- [address] [place], [address], [phone]  Only accepts [place] patients  after they have their name  applied to their card  Self Pay (no insurance) in [place]: - Sickle Cell Patients: Dr Willey Blade, [facility]  [address] [place], [phone] - [facility]- [address] [place]  [phone]       [facility] [place]- [address]       -     [facility]- see information above (Speak to [employer] if you do not have insurance)       -  [facility]- [address] [place], [phone]       -  [facility]- [address],  [phone]       -  [facility]- [address], [phone]       -  Dr Julio Sicks-  [address], [place], [phone]       -  [facility]- [address] [phone]       -  [facility]- [address], [phone], also [address] [phone]       -    [facility]- [address] [place], [phone], 1st & 3rd Saturday   every month, 10am-1pm  1) Find a Doctor and Pay Out of Pocket Although you won't have to find out who is covered by your insurance plan, it is a good idea to ask around and get recommendations. You will then need to call the office and see if the doctor you have chosen will accept you as a new patient and what types of options they offer for patients who are self-pay. Some doctors offer discounts or will set up payment plans for their patients who do not have insurance, but you will need to ask so you aren't surprised when you get to your appointment.  2) Contact Your Local Health Department Not all health departments have doctors that can see patients for sick visits, but many do, so it is worth a call to see if yours does. If you don't know where your local health department is, you can check in your phone book. The CDC also has a tool to help you locate your state's health department, and many state websites also have listings of all of their local health departments.  3) Find  a Walk-in Clinic If your illness is not likely to be very severe or complicated, you may want to try a walk in clinic. These are popping up all over the country in pharmacies, drugstores, and shopping centers. They're usually staffed by nurse practitioners or physician assistants that have been trained to treat common illnesses and complaints. They're usually fairly quick and inexpensive. However, if you have serious medical issues or chronic medical problems, these are probably not your best option  STD Testing - [facility] Department of [facility] [place], STD Clinic, [address], [place], phone [phone] or [phone].  Monday - Friday, call for an appointment. [facility] Department of [employer], STD Clinic, [place] [address], [place], phone [phone] or [phone].  Monday - Friday, call for an appointment.  Abuse/Neglect: [facility] Child Abuse Hotline [phone] [facility] Child Abuse Hotline (701) 181-1878 (After Hours)  Emergency Shelter:  [facility] [phone]) [phone]  Maternity Homes: - [facility] [place] [facility] 7787526367 - [facility] [phone]  MRSA Hotline #:   236 500 7801  [facility] Resources  [facility]  United Way [facility] Dept. [address]                 [address]         [address] [place] [address]  Blondell Reveal Phone:  [phone]                                  Phone:  [phone]                   Phone:  [phone]  [facility], [phone] - [phone]       -     [facility] in [place], [address]                                  [phone], [facility] Child Abuse Hotline 408-518-3266 or 4400660098 (After Hours)   Behavioral Health Services  Substance Abuse Resources: - Alcohol and Drug Services  [phone] - [facility] [phone] - [facility] [place] [phone] Floydene Flock [phone] - Residential & Outpatient Substance Abuse Program  [phone]  Psychological Services: [facility]  [phone] Services  [phone] - [facility], [phone] [place]. [address] [place], ACCESS LINE: [phone] or [phone], [employer]  Dental Assistance  If unable to pay or uninsured, contact:  [facility] or [facility]. to become qualified for the adult dental clinic.  Patients with Medicaid: [facility] [phone] [address], [phone] [address] [phone]  If unable to pay, or uninsured, contact [facility] [phone]) [phone]) or [facility] Department [phone] in [place], [phone] in [facility]) to become qualified for the adult dental clinic  Other Low-Cost Community Dental Services: - [facility]- [address] [place], [place], [place], [address], [phone], Ext. [phone], 2nd and 4th Thursday of the month at 6:30am.  10 clients each day by appointment, can sometimes see walk-in patients if someone does not show for an appointment. [facility] [address] [place], [place], [address], [phone] - [facility]- [address], [place], [place], [address], [phone] [facility] Health Department- [phone] - [facility] Department- [phone] - [place]  [facility] Department- [phone]

## 2011-09-24 NOTE — ED Provider Notes (Signed)
History     CSN: [id number]  Arrival date & time 09/24/11  0446   First MD Initiated Contact with Patient 09/24/11 [phone]      Chief Complaint  Patient presents with  . Arm Pain    HPI  History provided by the patient. Patient is a 78 year old female with history of hypertension, hyperlipidemia, mitral valve regurg, osteoporosis and osteoarthritis who presents with complaints of right shoulder and arm pain. Patient states that pain last evening and continue to get worse. Patient had a very difficult time sleeping and getting comfortable secondary to pain. Pain became so severe woke her this morning. Patient has taken 2 Tylenol without significant improvement of symptoms. Pain was so severe this morning that she had some associated diaphoresis and nausea symptoms. Pain has a continuous ringing sensation. It is described as a soreness and sharp pain. Pain is made worse with movements of the arm. She denies any other aggravating or alleviating factors. Patient denies any associated chest pain, shortness of breath, pleuritic pain, heart palpitations, lightheadedness or near syncope, neck pain or stiffness, numbness or tingling. Patient denies having any similar symptoms previously.    Past Medical History  Diagnosis Date  . Osteoarthritis   . Anxiety   . Depression   . Hyperlipidemia   . Thyroid disease     Hypothyroidism  . Osteoporosis   . History of shingles     In past  . Intestinal bacterial overgrowth     In small colon  . Hypertension   . Degenerative disc disease     LS  . Heart murmur 5/13    mitral regurge - on echo     Past Surgical History  Procedure Date  . Tonsillectomy   . Bowel obstruction 1999  . Colonoscopy 1. 1999  2. 11/04    1. Not finished  2. Slight hemorrhage rectosigmoid area  . Dexa 1. 1191-4782   2. 9/04  3. 3/08     1. OP  2. OP, borderline, spine -2.44T  3. decreased BMD-OP  . Diverticulosis   . Esophagogastroduodenoscopy     Negative  .  Hemorrhoid procedure 4/08  . Dg knee 1-2 views bilat     LS x-ray with degenerative disc and facet change  . US echocardiography 07/2011    Normal sys fxn with EF 55-60%.  Focal basal septal hypertrophy.  Mild diastolic dysfunction.  Mild MR.    Family History  Problem Relation Age of Onset  . Diabetes Brother   . Coronary artery disease Brother     History  Substance Use Topics  . Smoking status: Never Smoker   . Smokeless tobacco: Not on file  . Alcohol Use: No    OB History    Grav Para Term Preterm Abortions TAB SAB Ect Mult Living                  Review of Systems  Constitutional: Positive for diaphoresis. Negative for fever and chills.  HENT: Negative for neck pain and neck stiffness.   Respiratory: Negative for shortness of breath.   Cardiovascular: Negative for chest pain and palpitations.  Gastrointestinal: Positive for nausea. Negative for vomiting, abdominal pain, diarrhea and constipation.  Musculoskeletal: Positive for myalgias. Negative for joint swelling.  Skin: Negative for rash.  Neurological: Negative for dizziness, light-headedness and headaches.  All other systems reviewed and are negative.    Allergies  Ace inhibitors; Alendronate sodium; Paroxetine hcl; Risedronate sodium; Simvastatin; and Sulfonamide derivatives  Home  Medications   Current Outpatient Rx  Name Route Sig Dispense Refill  . BIMATOPROST 0.01 % OP SOLN Ophthalmic Apply to eye. Put one drop in both eyes at night.     . CHLORDIAZEPOXIDE HCL 10 MG PO CAPS  TAKE ONE CAPSULE BY MOUTH TWICE DAILY 60 capsule 3  . FEXOFENADINE HCL 180 MG PO TABS Oral Take 180 mg by mouth daily. Take as needed.     Marland Kitchen HYDROCHLOROTHIAZIDE 25 MG PO TABS Oral Take 1 tablet (25 mg total) by mouth daily. 90 tablet 3  . IMIPRAMINE HCL 25 MG PO TABS Oral Take 1 tablet (25 mg total) by mouth 2 (two) times daily. 180 tablet 3  . LEVOTHYROXINE SODIUM 75 MCG PO TABS Oral Take 1 tablet (75 mcg total) by mouth daily. 90  tablet 3  . LOSARTAN POTASSIUM 50 MG PO TABS  Take 11/2 tabs by mouth daily 135 tablet 3  . POLYETHYLENE GLYCOL 3350 PO POWD Oral Take by mouth. OTC as directed.     Marland Kitchen SIMVASTATIN 20 MG PO TABS Oral Take 0.5 tablets (10 mg total) by mouth daily. 45 tablet 3  . TERBINAFINE HCL 250 MG PO TABS Oral Take 250 mg by mouth daily.      BP 163/89  Pulse 79  Temp(Src) 97.4 F (36.3 C) (Oral)  Resp 14  SpO2 100%  Physical Exam  Nursing note and vitals reviewed. Constitutional: She is oriented to person, place, and time. She appears well-developed and well-nourished. No distress.  HENT:  Head: Normocephalic.  Cardiovascular: Normal rate and regular rhythm.   Pulmonary/Chest: Effort normal and breath sounds normal.  Abdominal: Soft.  Musculoskeletal:       Patient with tenderness to palpation over anterior right shoulder. She has reduced range of motion shoulder secondary to pain. No swelling or deformity.  Skin is normal.  Mild tenderness over the biceps area. No deformity or mass. Normal range of motion of elbow. Normal radial pulses and grip strength with normal sensation and cap refill in fingers.  Neurological: She is alert and oriented to person, place, and time.  Skin: Skin is warm and dry. No rash noted.  Psychiatric: She has a normal mood and affect. Her behavior is normal.    ED Course  Procedures   Results for orders placed during the hospital encounter of 09/24/11  CBC      Component Value Range   WBC 11.7 (*) 4.0 - 10.5 (K/uL)   RBC 4.53  3.87 - 5.11 (MIL/uL)   Hemoglobin 14.2  12.0 - 15.0 (g/dL)   HCT 40.9  81.1 - 91.4 (%)   MCV 89.2  78.0 - 100.0 (fL)   MCH 31.3  26.0 - 34.0 (pg)   MCHC 35.1  30.0 - 36.0 (g/dL)   RDW 78.2  95.6 - 21.3 (%)   Platelets 214  150 - 400 (K/uL)  DIFFERENTIAL      Component Value Range   Neutrophils Relative 75  43 - 77 (%)   Neutro Abs 8.7 (*) 1.7 - 7.7 (K/uL)   Lymphocytes Relative 14  12 - 46 (%)   Lymphs Abs 1.6  0.7 - 4.0 (K/uL)    Monocytes Relative 10  3 - 12 (%)   Monocytes Absolute 1.2 (*) 0.1 - 1.0 (K/uL)   Eosinophils Relative 1  0 - 5 (%)   Eosinophils Absolute 0.1  0.0 - 0.7 (K/uL)   Basophils Relative 0  0 - 1 (%)   Basophils Absolute 0.0  0.0 - 0.1 (K/uL)  COMPREHENSIVE METABOLIC PANEL      Component Value Range   Sodium 133 (*) 135 - 145 (mEq/L)   Potassium 3.4 (*) 3.5 - 5.1 (mEq/L)   Chloride 94 (*) 96 - 112 (mEq/L)   CO2 26  19 - 32 (mEq/L)   Glucose, Bld 119 (*) 70 - 99 (mg/dL)   BUN 18  6 - 23 (mg/dL)   Creatinine, Ser 6.57  0.50 - 1.10 (mg/dL)   Calcium 9.6  8.4 - 84.6 (mg/dL)   Total Protein 6.9  6.0 - 8.3 (g/dL)   Albumin 3.8  3.5 - 5.2 (g/dL)   AST 23  0 - 37 (U/L)   ALT 14  0 - 35 (U/L)   Alkaline Phosphatase 98  39 - 117 (U/L)   Total Bilirubin 0.5  0.3 - 1.2 (mg/dL)   GFR calc non Af Amer 51 (*) >90 (mL/min)   GFR calc Af Amer 59 (*) >90 (mL/min)  TROPONIN I      Component Value Range   Troponin I <0.30  <0.30 (ng/mL)      Dg Chest 2 View  09/24/2011  *RADIOLOGY REPORT*  Clinical Data: Right shoulder pain radiating down right arm.  CHEST - 2 VIEW  Comparison: None.  Findings: Normal heart size and pulmonary vascularity. Emphysematous changes in the lungs with scattered fibrosis.  No blunting of costophrenic angles.  No pneumothorax.  Calcified and tortuous aorta.  Thoracolumbar scoliosis.  Old left clavicular fracture.  Old left rib fractures.  Degenerative changes in the shoulders.  Degenerative changes in the spine.  IMPRESSION: Emphysematous changes and scattered fibrosis in the lungs.  No evidence of active pulmonary disease.  Original Report Authenticated By: Marlon Pel, M.D.   Dg Shoulder Right  09/24/2011  *RADIOLOGY REPORT*  Clinical Data: Right shoulder pain radiating down right arm.  RIGHT SHOULDER - 2+ VIEW  Comparison: None  Findings: Degenerative changes in the right shoulder involving the glenohumeral and acromioclavicular joints. Near complete loss of the  glenohumeral space.  Increased sub acromial space likely representing effusion.  Linear sclerosis along the humeral head consistent with marginal osteophytes.  The no evidence of acute fracture or subluxation. Calcified granulomas in the right upper lung.  IMPRESSION: Prominent degenerative changes in the right shoulder with increased sub acromial space suggesting effusion.  No acute fracture.  Original Report Authenticated By: Marlon Pel, M.D.     1. Shoulder pain   2. Shoulder effusion, right   3. Degenerative joint disease       MDM  Pt seen and evaluated. Patient in no acute distress.  Pt seen and evalauted with Attending Physician.  Pain appears musculoskeletal and located in rt shoulder.  X-rays show signs for possible joint effusion.  EKG reviewed.  Findings and treatment plan discussed with patient.      Angus Seller, [place] 09/24/11 [phone]

## 2011-09-24 NOTE — ED Provider Notes (Signed)
Medical screening examination/treatment/procedure(s) were conducted as a shared visit with non-physician practitioner(s) and myself.  I personally evaluated the patient during the encounter  Doubt septic joint, but would recommend close f/u if pain not improved Stable for d/c  Joya Gaskins, MD 09/24/11 [phone]

## 2011-09-24 NOTE — ED Notes (Signed)
PT. REPORTS RIGHT ARM PAIN WITH DIAPHORESIS ONSET THIS MORNING WITH SLIGHT NAUSEA, DENIES CHEST PAIN OR SOB .

## 2011-10-12 ENCOUNTER — Other Ambulatory Visit: Payer: Self-pay | Admitting: Family Medicine

## 2011-10-12 DIAGNOSIS — Z1231 Encounter for screening mammogram for malignant neoplasm of breast: Secondary | ICD-10-CM

## 2011-10-15 ENCOUNTER — Ambulatory Visit
Admission: RE | Admit: 2011-10-15 | Discharge: 2011-10-15 | Disposition: A | Discharge status: Home / Self Care | Payer: Medicare Other | Source: Ambulatory Visit | Attending: Family Medicine | Admitting: Family Medicine

## 2011-10-15 DIAGNOSIS — Z1231 Encounter for screening mammogram for malignant neoplasm of breast: Secondary | ICD-10-CM

## 2011-10-18 ENCOUNTER — Encounter: Payer: Self-pay | Admitting: *Deleted

## 2011-12-07 ENCOUNTER — Other Ambulatory Visit: Payer: Self-pay | Admitting: Dermatology

## 2011-12-08 ENCOUNTER — Other Ambulatory Visit: Payer: MEDICARE

## 2011-12-15 ENCOUNTER — Encounter: Payer: MEDICARE | Admitting: Family Medicine

## 2012-01-04 ENCOUNTER — Other Ambulatory Visit: Payer: Self-pay | Admitting: Family Medicine

## 2012-01-04 NOTE — Telephone Encounter (Signed)
Px written for call in   

## 2012-01-04 NOTE — Telephone Encounter (Signed)
Medication phoned to pharmacy.  

## 2012-01-04 NOTE — Telephone Encounter (Signed)
Request for Libruim 10 mg last OV 09/13/11. Ok to refill?

## 2012-01-05 ENCOUNTER — Other Ambulatory Visit: Payer: Self-pay

## 2012-03-12 ENCOUNTER — Telehealth: Payer: Self-pay | Admitting: Family Medicine

## 2012-03-12 DIAGNOSIS — E039 Hypothyroidism, unspecified: Secondary | ICD-10-CM

## 2012-03-12 DIAGNOSIS — E785 Hyperlipidemia, unspecified: Secondary | ICD-10-CM

## 2012-03-12 DIAGNOSIS — I10 Essential (primary) hypertension: Secondary | ICD-10-CM

## 2012-03-12 DIAGNOSIS — M810 Age-related osteoporosis without current pathological fracture: Secondary | ICD-10-CM

## 2012-03-12 NOTE — Telephone Encounter (Signed)
Message copied by Judy Pimple on Sun Mar 12, 2012  5:13 PM ------      Message from: Baldomero Lamy      Created: Thu Mar 02, 2012  1:52 PM      Regarding: Cpx labsMon 03/13/12       Please order  future cpx labs for pt's upcomming lab appt.      Thanks      Rodney Booze

## 2012-03-13 ENCOUNTER — Other Ambulatory Visit (INDEPENDENT_AMBULATORY_CARE_PROVIDER_SITE_OTHER): Payer: Medicare Other

## 2012-03-13 DIAGNOSIS — I10 Essential (primary) hypertension: Secondary | ICD-10-CM

## 2012-03-13 DIAGNOSIS — E039 Hypothyroidism, unspecified: Secondary | ICD-10-CM

## 2012-03-13 DIAGNOSIS — M810 Age-related osteoporosis without current pathological fracture: Secondary | ICD-10-CM

## 2012-03-13 DIAGNOSIS — E785 Hyperlipidemia, unspecified: Secondary | ICD-10-CM

## 2012-03-13 LAB — COMPREHENSIVE METABOLIC PANEL
ALT: 16 U/L (ref 0–35)
AST: 25 U/L (ref 0–37)
Alkaline Phosphatase: 98 U/L (ref 39–117)
Creatinine, Ser: 1.1 mg/dL (ref 0.4–1.2)
GFR: 49.84 mL/min — ABNORMAL LOW (ref >60.00)
Sodium: 136 mEq/L (ref 135–145)
Total Bilirubin: 0.8 mg/dL (ref 0.3–1.2)
Total Protein: 7.1 g/dL (ref 6.0–8.3)

## 2012-03-13 LAB — CBC WITH DIFFERENTIAL/PLATELET
Basophils Absolute: 0 10*3/uL (ref 0.0–0.1)
Eosinophils Absolute: 0.2 10*3/uL (ref 0.0–0.7)
HCT: 42 % (ref 36.0–46.0)
Hemoglobin: 13.8 g/dL (ref 12.0–15.0)
Lymphs Abs: 1.7 10*3/uL (ref 0.7–4.0)
MCHC: 32.8 g/dL (ref 30.0–36.0)
Monocytes Absolute: 0.8 10*3/uL (ref 0.1–1.0)
Neutro Abs: 4.2 10*3/uL (ref 1.4–7.7)
Platelets: 269 10*3/uL (ref 150.0–400.0)
RDW: 13.2 % (ref 11.5–14.6)

## 2012-03-13 LAB — TSH: TSH: 5.36 u[IU]/mL (ref 0.35–5.50)

## 2012-03-13 LAB — LIPID PANEL
HDL: 84.4 mg/dL (ref >39.00)
LDL Cholesterol: 74 mg/dL (ref 0–99)
Total CHOL/HDL Ratio: 2
VLDL: 16.8 mg/dL (ref 0.0–40.0)

## 2012-03-17 ENCOUNTER — Encounter: Payer: MEDICARE | Admitting: Family Medicine

## 2012-03-20 ENCOUNTER — Encounter: Payer: Self-pay | Admitting: Family Medicine

## 2012-03-20 ENCOUNTER — Ambulatory Visit (INDEPENDENT_AMBULATORY_CARE_PROVIDER_SITE_OTHER): Payer: Medicare Other | Admitting: Family Medicine

## 2012-03-20 VITALS — BP 138/86 | HR 92 | Temp 98.4°F | Ht 62.0 in | Wt 157.5 lb

## 2012-03-20 DIAGNOSIS — R7303 Prediabetes: Secondary | ICD-10-CM | POA: Insufficient documentation

## 2012-03-20 DIAGNOSIS — R739 Hyperglycemia, unspecified: Secondary | ICD-10-CM

## 2012-03-20 DIAGNOSIS — E039 Hypothyroidism, unspecified: Secondary | ICD-10-CM

## 2012-03-20 DIAGNOSIS — Z1211 Encounter for screening for malignant neoplasm of colon: Secondary | ICD-10-CM

## 2012-03-20 DIAGNOSIS — M810 Age-related osteoporosis without current pathological fracture: Secondary | ICD-10-CM

## 2012-03-20 DIAGNOSIS — E785 Hyperlipidemia, unspecified: Secondary | ICD-10-CM

## 2012-03-20 DIAGNOSIS — I10 Essential (primary) hypertension: Secondary | ICD-10-CM

## 2012-03-20 DIAGNOSIS — R7309 Other abnormal glucose: Secondary | ICD-10-CM

## 2012-03-20 NOTE — Assessment & Plan Note (Signed)
Good control with simvastatin and diet  Disc goals for lipids and reasons to control them Rev labs with pt Rev low sat fat diet in detail  

## 2012-03-20 NOTE — Assessment & Plan Note (Signed)
bp in fair control at this time  No changes needed  Disc lifstyle change with low sodium diet and exercise   Reviewed labs today 

## 2012-03-20 NOTE — Assessment & Plan Note (Signed)
Sugar is 108 fasting- will watch that  Disc low glycemic diet F/u 6 mo - will do A1c Has DM in family and is overwt

## 2012-03-20 NOTE — Assessment & Plan Note (Signed)
Hypothyroidism  Pt has no clinical changes No change in energy level/ hair or skin/ edema and no tremor Lab Results  Component Value Date   TSH 5.36 03/13/2012

## 2012-03-20 NOTE — Progress Notes (Signed)
Subjective:    Patient ID: Brooke Beasley, female    DOB: 1930/09/27, 78 y.o.   MRN: [id number]  HPI Here for check up of chronic medical conditions and to review health mt list   Doing well , but is coming down with a cold  She does not take flu shot = declines   Wt is stable with bmi of 28 She eats healthy and needs to get more exercise   bp is stable today  No cp or palpitations or headaches or edema  No side effects to medicines  BP Readings from Last 3 Encounters:  03/20/12 138/86  09/24/11 163/89  09/13/11 126/80     Lab Results  Component Value Date   CHOL 175 03/13/2012   CHOL 150 12/15/2010   CHOL 180 09/19/2009   Lab Results  Component Value Date   HDL 84.40 03/13/2012   HDL 69.50 12/15/2010   HDL 10.27 09/19/2009   Lab Results  Component Value Date   LDLCALC 74 03/13/2012   LDLCALC 66 12/15/2010   LDLCALC 74 09/19/2009   Lab Results  Component Value Date   TRIG 84.0 03/13/2012   TRIG 73.0 12/15/2010   TRIG 87.0 09/19/2009   Lab Results  Component Value Date   CHOLHDL 2 03/13/2012   CHOLHDL 2 12/15/2010   CHOLHDL 2 09/19/2009   Lab Results  Component Value Date   LDLDIRECT 143.8 01/23/2009   LDLDIRECT 134.3 10/09/2008   LDLDIRECT 125.0 02/29/2008   looking good- good ratio She does watch her diet - much less fried foods  Eating oatmeal   Glucose is 108  OP dexa was in 2011 Ca and D--does not always remember to take  fx- none   falls-none  Vit D level is 42  Mood is good overall-no tearfulness , and is motivated   Hypothyroid Hypothyroidism  Pt has no clinical changes No change in energy level/ hair or skin/ edema and no tremor Lab Results  Component Value Date   TSH 5.36 03/13/2012     Colon cancer screen=- she cannot tolerate colonosc due to tortuous colon? - Dr Matthias Hughs Last one - not sure   mammo 6/13 Self exam -no lumps or change   No gyn problems or complaints   Patient Active Problem List  Diagnosis  . HYPOTHYROIDISM  .  HYPERLIPIDEMIA  . ANXIETY  . DEPRESSION  . GLAUCOMA  . HYPERTENSION, BENIGN  . HEMORRHOIDS, INTERNAL  . CONSTIPATION, CHRONIC  . IBS  . NIPPLE DISCHARGE  . OSTEOARTHRITIS  . OSTEOPOROSIS  . EDEMA  . ALLERGY  . Preventative health care  . Neck pain  . Syncope  . Heart murmur   Past Medical History  Diagnosis Date  . Osteoarthritis   . Anxiety   . Depression   . Hyperlipidemia   . Thyroid disease     Hypothyroidism  . Osteoporosis   . History of shingles     In past  . Intestinal bacterial overgrowth     In small colon  . Hypertension   . Degenerative disc disease     LS  . Heart murmur 5/13    mitral regurge - on echo    Past Surgical History  Procedure Date  . Tonsillectomy   . Bowel obstruction 1999  . Colonoscopy 1. 1999  2. 11/04    1. Not finished  2. Slight hemorrhage rectosigmoid area  . Dexa 1. 2536-6440   2. 9/04  3. 3/08     1. OP  2. OP, borderline, spine -2.44T  3. decreased BMD-OP  . Diverticulosis   . Esophagogastroduodenoscopy     Negative  . Hemorrhoid procedure 4/08  . Dg knee 1-2 views bilat     LS x-ray with degenerative disc and facet change  . US echocardiography 07/2011    Normal sys fxn with EF 55-60%.  Focal basal septal hypertrophy.  Mild diastolic dysfunction.  Mild MR.   History  Substance Use Topics  . Smoking status: Never Smoker   . Smokeless tobacco: Not on file  . Alcohol Use: No   Family History  Problem Relation Age of Onset  . Diabetes Brother   . Coronary artery disease Brother    Allergies  Allergen Reactions  . Ace Inhibitors     REACTION: cough  . Alendronate Sodium     REACTION: palpitations  . Paroxetine Hcl     Not effective   . Risedronate Sodium     REACTION: joint pain  . Simvastatin     REACTION: the full 20 mg pill causes leg pain- can tol 10 mg  . Sulfonamide Derivatives     REACTION: rash   Current Outpatient Prescriptions on File Prior to Visit  Medication Sig Dispense Refill  .  Bimatoprost (LUMIGAN) 0.01 % SOLN Apply to eye as needed. Put one drop in both eyes at night.      . chlordiazePOXIDE (LIBRIUM) 10 MG capsule TAKE ONE CAPSULE BY MOUTH TWICE DAILY  60 capsule  3  . fexofenadine (ALLEGRA) 180 MG tablet Take 180 mg by mouth daily. Take as needed.       . hydrochlorothiazide (HYDRODIURIL) 25 MG tablet Take 1 tablet (25 mg total) by mouth daily.  90 tablet  3  . imipramine (TOFRANIL) 25 MG tablet Take 1 tablet (25 mg total) by mouth 2 (two) times daily.  180 tablet  3  . levothyroxine (SYNTHROID, LEVOTHROID) 75 MCG tablet Take 1 tablet (75 mcg total) by mouth daily.  90 tablet  3  . losartan (COZAAR) 50 MG tablet Take 11/2 tabs by mouth daily  135 tablet  3  . polyethylene glycol powder (MIRALAX) powder Take by mouth. OTC as directed.       . simvastatin (ZOCOR) 20 MG tablet Take 0.5 tablets (10 mg total) by mouth daily.  45 tablet  3  . terbinafine (LAMISIL) 250 MG tablet Take 250 mg by mouth daily.            Review of Systems Review of Systems  Constitutional: Negative for fever, appetite change, fatigue and unexpected weight change.  Eyes: Negative for pain and visual disturbance.  Respiratory: Negative for cough and shortness of breath.   Cardiovascular: Negative for cp or palpitations    Gastrointestinal: Negative for nausea, diarrhea and constipation.  Genitourinary: Negative for urgency and frequency.  Skin: Negative for pallor or rash   Neurological: Negative for weakness, light-headedness, numbness and headaches.  Hematological: Negative for adenopathy. Does not bruise/bleed easily.  Psychiatric/Behavioral: Negative for dysphoric mood. The patient is not nervous/anxious.         Objective:   Physical Exam  Constitutional: She appears well-developed and well-nourished. No distress.  HENT:  Head: Normocephalic and atraumatic.  Right Ear: External ear normal.  Left Ear: External ear normal.  Mouth/Throat: Oropharynx is clear and moist. No  oropharyngeal exudate.       Nares are boggy  Eyes: Conjunctivae normal and EOM are normal. Pupils are equal, round, and reactive to light. Right  eye exhibits no discharge. Left eye exhibits no discharge. No scleral icterus.  Neck: Normal range of motion. Neck supple. No JVD present. Carotid bruit is not present. No thyromegaly present.  Cardiovascular: Normal rate, regular rhythm, normal heart sounds and intact distal pulses.  Exam reveals no gallop.   Pulmonary/Chest: Effort normal and breath sounds normal. No respiratory distress. She has no wheezes.  Abdominal: Soft. Bowel sounds are normal. She exhibits no distension and no mass. There is no tenderness.  Genitourinary: No breast swelling, tenderness, discharge or bleeding.       Breast exam: No mass, nodules, thickening, tenderness, bulging, retraction, inflamation, nipple discharge or skin changes noted.  No axillary or clavicular LA.  Chaperoned exam.    Musculoskeletal: She exhibits no edema.  Lymphadenopathy:    She has no cervical adenopathy.  Neurological: She is alert. She has normal reflexes. No cranial nerve deficit. She exhibits normal muscle tone. Coordination normal.  Skin: Skin is warm and dry. No rash noted. No erythema. No pallor.  Psychiatric: She has a normal mood and affect.          Assessment & Plan:

## 2012-03-20 NOTE — Assessment & Plan Note (Signed)
dexa was in 5/11 Reminded to take ca and D  D level ok  Disc fall precautions No falls or fractures

## 2012-03-20 NOTE — Patient Instructions (Addendum)
Please send for last colon study from Dr Matthias Hughs (it may be a colonoscopy or imaging, I'm not sure) Try to get 1200-1500 mg of calcium per day with at least 1000 iu of vitamin D - for bone health  Please do stool card for colon cancer screening  If you are coming down with a cold - drink lots of fluids and get extra rest - sometimes zinc losenges help for the first few days We need to watch your sugar closely -- please avoid sweets/ sugar drinks/ juices and too many starches (bread/ pasta)  Follow up in about 6 months

## 2012-03-20 NOTE — Assessment & Plan Note (Signed)
Given ifob Pt ? Cannot have colonosc- will send for last colon study from Dr [employer]

## 2012-04-08 ENCOUNTER — Encounter (HOSPITAL_COMMUNITY): Payer: Self-pay

## 2012-04-08 ENCOUNTER — Emergency Department (HOSPITAL_COMMUNITY): Payer: Medicare Other

## 2012-04-08 ENCOUNTER — Observation Stay (HOSPITAL_COMMUNITY)
Admission: EM | Admit: 2012-04-08 | Discharge: 2012-04-09 | Disposition: A | Discharge status: Home / Self Care | Payer: Medicare Other | Attending: Family Medicine | Admitting: Family Medicine

## 2012-04-08 DIAGNOSIS — F319 Bipolar disorder, unspecified: Secondary | ICD-10-CM | POA: Insufficient documentation

## 2012-04-08 DIAGNOSIS — S0230XA Fracture of orbital floor, unspecified side, initial encounter for closed fracture: Secondary | ICD-10-CM | POA: Insufficient documentation

## 2012-04-08 DIAGNOSIS — I340 Nonrheumatic mitral (valve) insufficiency: Secondary | ICD-10-CM | POA: Diagnosis present

## 2012-04-08 DIAGNOSIS — I509 Heart failure, unspecified: Secondary | ICD-10-CM | POA: Insufficient documentation

## 2012-04-08 DIAGNOSIS — I38 Endocarditis, valve unspecified: Secondary | ICD-10-CM | POA: Diagnosis present

## 2012-04-08 DIAGNOSIS — K648 Other hemorrhoids: Secondary | ICD-10-CM

## 2012-04-08 DIAGNOSIS — R7309 Other abnormal glucose: Secondary | ICD-10-CM | POA: Insufficient documentation

## 2012-04-08 DIAGNOSIS — R7303 Prediabetes: Secondary | ICD-10-CM | POA: Diagnosis present

## 2012-04-08 DIAGNOSIS — R011 Cardiac murmur, unspecified: Secondary | ICD-10-CM | POA: Diagnosis present

## 2012-04-08 DIAGNOSIS — S0292XA Unspecified fracture of facial bones, initial encounter for closed fracture: Secondary | ICD-10-CM

## 2012-04-08 DIAGNOSIS — S02109A Fracture of base of skull, unspecified side, initial encounter for closed fracture: Secondary | ICD-10-CM | POA: Insufficient documentation

## 2012-04-08 DIAGNOSIS — Y92009 Unspecified place in unspecified non-institutional (private) residence as the place of occurrence of the external cause: Secondary | ICD-10-CM | POA: Insufficient documentation

## 2012-04-08 DIAGNOSIS — M542 Cervicalgia: Secondary | ICD-10-CM

## 2012-04-08 DIAGNOSIS — Z79899 Other long term (current) drug therapy: Secondary | ICD-10-CM | POA: Insufficient documentation

## 2012-04-08 DIAGNOSIS — R609 Edema, unspecified: Secondary | ICD-10-CM

## 2012-04-08 DIAGNOSIS — S02401A Maxillary fracture, unspecified, initial encounter for closed fracture: Secondary | ICD-10-CM

## 2012-04-08 DIAGNOSIS — I5030 Unspecified diastolic (congestive) heart failure: Secondary | ICD-10-CM | POA: Insufficient documentation

## 2012-04-08 DIAGNOSIS — T7840XA Allergy, unspecified, initial encounter: Secondary | ICD-10-CM | POA: Diagnosis present

## 2012-04-08 DIAGNOSIS — R739 Hyperglycemia, unspecified: Secondary | ICD-10-CM

## 2012-04-08 DIAGNOSIS — M1990 Unspecified osteoarthritis, unspecified site: Secondary | ICD-10-CM | POA: Diagnosis present

## 2012-04-08 DIAGNOSIS — Z0000 Encounter for general adult medical examination without abnormal findings: Secondary | ICD-10-CM

## 2012-04-08 DIAGNOSIS — K5909 Other constipation: Secondary | ICD-10-CM

## 2012-04-08 DIAGNOSIS — I951 Orthostatic hypotension: Principal | ICD-10-CM | POA: Insufficient documentation

## 2012-04-08 DIAGNOSIS — R55 Syncope and collapse: Secondary | ICD-10-CM

## 2012-04-08 DIAGNOSIS — M810 Age-related osteoporosis without current pathological fracture: Secondary | ICD-10-CM | POA: Diagnosis present

## 2012-04-08 DIAGNOSIS — I10 Essential (primary) hypertension: Secondary | ICD-10-CM | POA: Diagnosis present

## 2012-04-08 DIAGNOSIS — Z1211 Encounter for screening for malignant neoplasm of colon: Secondary | ICD-10-CM

## 2012-04-08 DIAGNOSIS — N6459 Other signs and symptoms in breast: Secondary | ICD-10-CM

## 2012-04-08 DIAGNOSIS — K589 Irritable bowel syndrome without diarrhea: Secondary | ICD-10-CM | POA: Insufficient documentation

## 2012-04-08 DIAGNOSIS — F329 Major depressive disorder, single episode, unspecified: Secondary | ICD-10-CM

## 2012-04-08 DIAGNOSIS — H409 Unspecified glaucoma: Secondary | ICD-10-CM

## 2012-04-08 DIAGNOSIS — E785 Hyperlipidemia, unspecified: Secondary | ICD-10-CM | POA: Insufficient documentation

## 2012-04-08 DIAGNOSIS — W19XXXA Unspecified fall, initial encounter: Secondary | ICD-10-CM | POA: Insufficient documentation

## 2012-04-08 DIAGNOSIS — K581 Irritable bowel syndrome with constipation: Secondary | ICD-10-CM | POA: Diagnosis present

## 2012-04-08 DIAGNOSIS — E039 Hypothyroidism, unspecified: Secondary | ICD-10-CM | POA: Diagnosis present

## 2012-04-08 DIAGNOSIS — F411 Generalized anxiety disorder: Secondary | ICD-10-CM

## 2012-04-08 HISTORY — DX: Hypothyroidism, unspecified: E03.9

## 2012-04-08 HISTORY — DX: Gastro-esophageal reflux disease without esophagitis: K21.9

## 2012-04-08 HISTORY — DX: Maxillary fracture, unspecified side, initial encounter for closed fracture: S02.401A

## 2012-04-08 LAB — CBC WITH DIFFERENTIAL/PLATELET
Basophils Relative: 0 % (ref 0–1)
Eosinophils Absolute: 0.1 10*3/uL (ref 0.0–0.7)
Eosinophils Relative: 1 % (ref 0–5)
Lymphs Abs: 1.4 10*3/uL (ref 0.7–4.0)
MCH: 31.4 pg (ref 26.0–34.0)
MCHC: 35.3 g/dL (ref 30.0–36.0)
MCV: 89.1 fL (ref 78.0–100.0)
Monocytes Relative: 9 % (ref 3–12)
Platelets: 284 10*3/uL (ref 150–400)
RBC: 4.77 MIL/uL (ref 3.87–5.11)

## 2012-04-08 LAB — BASIC METABOLIC PANEL
BUN: 13 mg/dL (ref 6–23)
Calcium: 10.3 mg/dL (ref 8.4–10.5)
GFR calc non Af Amer: 50 mL/min — ABNORMAL LOW (ref >90)
Glucose, Bld: 137 mg/dL — ABNORMAL HIGH (ref 70–99)
Sodium: 134 mEq/L — ABNORMAL LOW (ref 135–145)

## 2012-04-08 LAB — URINALYSIS, ROUTINE W REFLEX MICROSCOPIC
Bilirubin Urine: NEGATIVE
Glucose, UA: NEGATIVE mg/dL
Hgb urine dipstick: NEGATIVE
Ketones, ur: NEGATIVE mg/dL
Protein, ur: NEGATIVE mg/dL
Urobilinogen, UA: 0.2 mg/dL (ref 0.0–1.0)

## 2012-04-08 LAB — POCT I-STAT TROPONIN I: Troponin i, poc: 0 ng/mL (ref 0.00–0.08)

## 2012-04-08 MED ORDER — IMIPRAMINE HCL 25 MG PO TABS
25.0000 mg | ORAL_TABLET | Freq: Two times a day (BID) | ORAL | Status: DC
  Administered 2012-04-08 – 2012-04-09 (×2): 25 mg via ORAL
  Filled 2012-04-08 (×3): qty 1

## 2012-04-08 MED ORDER — ACETAMINOPHEN 325 MG PO TABS
650.0000 mg | ORAL_TABLET | ORAL | Status: DC | PRN
  Administered 2012-04-08 – 2012-04-09 (×2): 650 mg via ORAL
  Filled 2012-04-08 (×2): qty 2

## 2012-04-08 MED ORDER — TERBINAFINE HCL 250 MG PO TABS: 250.0000 mg | ORAL_TABLET | Freq: Every day | ORAL | Status: DC

## 2012-04-08 MED ORDER — HEPARIN SODIUM (PORCINE) 5000 UNIT/ML IJ SOLN
5000.0000 [IU] | Freq: Three times a day (TID) | INTRAMUSCULAR | Status: DC
  Administered 2012-04-08 – 2012-04-09 (×2): 5000 [IU] via SUBCUTANEOUS
  Filled 2012-04-08 (×5): qty 1

## 2012-04-08 MED ORDER — SODIUM CHLORIDE 0.9 % IV SOLN
INTRAVENOUS | Status: DC
  Administered 2012-04-08 – 2012-04-09 (×2): via INTRAVENOUS

## 2012-04-08 MED ORDER — MENTHOL 3 MG MT LOZG
1.0000 | LOZENGE | OROMUCOSAL | Status: DC | PRN
  Administered 2012-04-08: 3 mg via ORAL
  Filled 2012-04-08 (×2): qty 9

## 2012-04-08 MED ORDER — FLUTICASONE PROPIONATE 50 MCG/ACT NA SUSP
1.0000 | Freq: Every day | NASAL | Status: DC
  Administered 2012-04-09: 1 via NASAL
  Filled 2012-04-08: qty 16

## 2012-04-08 MED ORDER — HYDROCHLOROTHIAZIDE 25 MG PO TABS
12.5000 mg | ORAL_TABLET | Freq: Every day | ORAL | Status: DC
  Administered 2012-04-08: 12.5 mg via ORAL
  Filled 2012-04-08 (×2): qty 0.5

## 2012-04-08 MED ORDER — IBUPROFEN 400 MG PO TABS
400.0000 mg | ORAL_TABLET | Freq: Three times a day (TID) | ORAL | Status: DC
  Administered 2012-04-08 – 2012-04-09 (×3): 400 mg via ORAL
  Filled 2012-04-08 (×6): qty 1

## 2012-04-08 MED ORDER — CHLORDIAZEPOXIDE HCL 5 MG PO CAPS
5.0000 mg | ORAL_CAPSULE | Freq: Two times a day (BID) | ORAL | Status: DC | PRN
  Administered 2012-04-08: 5 mg via ORAL
  Filled 2012-04-08: qty 1

## 2012-04-08 MED ORDER — POLYETHYLENE GLYCOL 3350 17 G PO PACK
17.0000 g | PACK | Freq: Every day | ORAL | Status: DC | PRN
  Filled 2012-04-08: qty 1

## 2012-04-08 MED ORDER — SODIUM CHLORIDE 0.9 % IJ SOLN
3.0000 mL | Freq: Two times a day (BID) | INTRAMUSCULAR | Status: DC
  Administered 2012-04-09: 3 mL via INTRAVENOUS

## 2012-04-08 MED ORDER — POLYETHYLENE GLYCOL 3350 17 GM/SCOOP PO POWD: 1.0000 | Freq: Once | ORAL | Status: DC

## 2012-04-08 MED ORDER — LEVOTHYROXINE SODIUM 75 MCG PO TABS
75.0000 ug | ORAL_TABLET | Freq: Every day | ORAL | Status: DC
  Administered 2012-04-09: 75 ug via ORAL
  Filled 2012-04-08 (×2): qty 1

## 2012-04-08 MED ORDER — AMOXICILLIN 500 MG PO CAPS
500.0000 mg | ORAL_CAPSULE | Freq: Three times a day (TID) | ORAL | Status: DC
  Administered 2012-04-08 – 2012-04-09 (×4): 500 mg via ORAL
  Filled 2012-04-08 (×6): qty 1

## 2012-04-08 NOTE — ED Notes (Signed)
Spoke with Josh PA, Pt. Is allowed to eat.  Family brought in sandwich and po fluids.

## 2012-04-08 NOTE — ED Notes (Signed)
Pt. Aware of needing urine specimen. 

## 2012-04-08 NOTE — ED Provider Notes (Signed)
Brooke Beasley is a 78 y.o. female who fell this morning in her room. Her husband found her on the floor, somewhat disoriented. She has since become fully alert. It is, unclear if she lost consciousness. The patient thinks she did. She complains of pain in her left chest and left face.  Exam- alert, cooperative. Left periorbital ecchymosis, without swelling. Normal TMJ motion. No facial instability, or crepitation. Neck is supple. Heart regular rate and rhythm. No murmur. Lungs clear to auscultation. Left chest tender, without crepitation or deformity.  ED evaluation for causes of syncope; was undertaken. Orthostatics negative. EKG negative; cardiac monitor initially showed only sinus rhythm. Chest x-ray, not acute.  Will add urinalysis, and left rib detail.  Will consider admission for syncope. Facial fractures. Will not require surgical treatment. She may benefit from antibiotics to prevent infection. She should avoid blowing her nose.     Medical screening examination/treatment/procedure(s) were conducted as a shared visit with non-physician practitioner(s) and myself.  I personally evaluated the patient during the encounter  Flint Melter, MD 04/08/12 [phone]

## 2012-04-08 NOTE — ED Provider Notes (Signed)
History     CSN: [id number]  Arrival date & time 04/08/12  4098   First MD Initiated Contact with Patient 04/08/12 [phone]      Chief Complaint  Patient presents with  . Fall    (Consider location/radiation/quality/duration/timing/severity/associated sxs/prior treatment) HPI Comments: Patient presents today after an episode of syncope and subsequent fall. Patient states that she was getting up to go to the bathroom this morning and fell to the floor on her left side. She did not have a prodrome. She had confirmed loss of consciousness by her husband. Patient presents with bruising around her left eye and left anterior rib cage pain. No shortness of breath. Patient denies palpitations or racing heart. She denies constant chest pain. No weakness, numbness, or tingling in her extremities. No nausea, vomiting, blurry vision. Medical record she the patient had a syncopal episode that was evaluated by her primary care physician in April 2013. At that point she had a 2-D echo which showed mild mitral regurgitation and mild diastolic heart failure. She has not had any problems since that time. Onset of symptoms were acute. Course is constant. Nothing makes symptoms better no other treatments prior to arrival. Per the patient's daughter, she had amnesia and was confused after falling.   The history is provided by the patient, a relative and medical records.    Past Medical History  Diagnosis Date  . Osteoarthritis   . Anxiety   . Depression   . Hyperlipidemia   . Thyroid disease     Hypothyroidism  . Osteoporosis   . History of shingles     In past  . Intestinal bacterial overgrowth     In small colon  . Hypertension   . Degenerative disc disease     LS  . Heart murmur 5/13    mitral regurge - on echo     Past Surgical History  Procedure Date  . Tonsillectomy   . Bowel obstruction 1999  . Colonoscopy 1. 1999  2. 11/04    1. Not finished  2. Slight hemorrhage rectosigmoid area  .  Dexa 1. 4782-9562   2. 9/04  3. 3/08     1. OP  2. OP, borderline, spine -2.44T  3. decreased BMD-OP  . Diverticulosis   . Esophagogastroduodenoscopy     Negative  . Hemorrhoid procedure 4/08  . Dg knee 1-2 views bilat     LS x-ray with degenerative disc and facet change  . US echocardiography 07/2011    Normal sys fxn with EF 55-60%.  Focal basal septal hypertrophy.  Mild diastolic dysfunction.  Mild MR.    Family History  Problem Relation Age of Onset  . Diabetes Brother   . Coronary artery disease Brother     History  Substance Use Topics  . Smoking status: Never Smoker   . Smokeless tobacco: Not on file  . Alcohol Use: No    OB History    Grav Para Term Preterm Abortions TAB SAB Ect Mult Living                  Review of Systems  Constitutional: Negative for fever and fatigue.  HENT: Negative for neck pain and tinnitus.   Eyes: Negative for photophobia, pain and visual disturbance.  Respiratory: Negative for shortness of breath.   Cardiovascular: Positive for chest pain. Negative for palpitations and leg swelling.  Gastrointestinal: Negative for nausea and vomiting.  Musculoskeletal: Negative for back pain and gait problem.  Skin: Negative  for wound.  Neurological: Positive for syncope. Negative for dizziness, weakness, light-headedness, numbness and headaches.  Psychiatric/Behavioral: Positive for confusion. Negative for decreased concentration.    Allergies  Ace inhibitors; Alendronate sodium; Paroxetine hcl; Risedronate sodium; Simvastatin; and Sulfonamide derivatives  Home Medications   Current Outpatient Rx  Name  Route  Sig  Dispense  Refill  . BIMATOPROST 0.01 % OP SOLN   Ophthalmic   Apply to eye as needed. Put one drop in both eyes at night.         . CHLORDIAZEPOXIDE HCL 10 MG PO CAPS      TAKE ONE CAPSULE BY MOUTH TWICE DAILY   60 capsule   3   . FEXOFENADINE HCL 180 MG PO TABS   Oral   Take 180 mg by mouth daily. Take as needed.           . OMEGA-3 FATTY ACIDS 1000 MG PO CAPS   Oral   Take 2 g by mouth 3 (three) times a week.         Marland Kitchen FLAX SEED OIL PO   Oral   Take by mouth 3 (three) times a week.         Marland Kitchen GARLIC 400 MG PO TABS   Oral   Take by mouth 2 (two) times a week.         Marland Kitchen HYDROCHLOROTHIAZIDE 25 MG PO TABS   Oral   Take 1 tablet (25 mg total) by mouth daily.   90 tablet   3   . IMIPRAMINE HCL 25 MG PO TABS   Oral   Take 1 tablet (25 mg total) by mouth 2 (two) times daily.   180 tablet   3   . LEVOTHYROXINE SODIUM 75 MCG PO TABS   Oral   Take 1 tablet (75 mcg total) by mouth daily.   90 tablet   3   . LOSARTAN POTASSIUM 50 MG PO TABS      Take 11/2 tabs by mouth daily   135 tablet   3   . POLYETHYLENE GLYCOL 3350 PO POWD   Oral   Take by mouth. OTC as directed.          Marland Kitchen SIMVASTATIN 20 MG PO TABS   Oral   Take 0.5 tablets (10 mg total) by mouth daily.   45 tablet   3     BP 121/69  Pulse 81  Temp 97.7 F (36.5 C)  Resp 18  Ht 5\' 3"  (1.6 m)  Wt 159 lb (72.122 kg)  BMI 28.17 kg/m2  SpO2 98%  LMP 04/08/2012   Physical Exam  Nursing note and vitals reviewed. Constitutional: She is oriented to person, place, and time. She appears well-developed and well-nourished.  HENT:  Head: Normocephalic. Head is without raccoon's eyes and without Battle's sign.  Right Ear: Tympanic membrane, external ear and ear canal normal. No hemotympanum.  Left Ear: Tympanic membrane, external ear and ear canal normal. No hemotympanum.  Nose: Nose normal. No nasal septal hematoma.  Mouth/Throat: Uvula is midline, oropharynx is clear and moist and mucous membranes are normal.       Bruising around L eye. No pain with eye movement. No malocclusion of jaw. No deformity or pain over zygomas.   Eyes: Conjunctivae normal, EOM and lids are normal. Pupils are equal, round, and reactive to light. Right eye exhibits no nystagmus. Left eye exhibits no nystagmus.       No visible hyphema noted   Neck:  Normal range of motion. Neck supple.  Cardiovascular: Normal rate and regular rhythm.   Murmur (2/6 systolic inferior LSB) heard. Pulmonary/Chest: Effort normal and breath sounds normal. No respiratory distress. She has no wheezes. She has no rales. She exhibits tenderness.  Abdominal: Soft. There is no tenderness.  Musculoskeletal:       Cervical back: She exhibits normal range of motion, no tenderness and no bony tenderness.       Thoracic back: She exhibits no tenderness and no bony tenderness.       Lumbar back: She exhibits no tenderness and no bony tenderness.  Neurological: She is alert and oriented to person, place, and time. She has normal strength and normal reflexes. No cranial nerve deficit or sensory deficit. She exhibits normal muscle tone. She displays a negative Romberg sign. Coordination normal. GCS eye subscore is 4. GCS verbal subscore is 5. GCS motor subscore is 6.  Skin: Skin is warm and dry.  Psychiatric: She has a normal mood and affect.    ED Course  Procedures (including critical care time)  Labs Reviewed  CBC WITH DIFFERENTIAL - Abnormal; Notable for the following:    WBC 10.9 (*)     Neutro Abs 8.4 (*)     All other components within normal limits  BASIC METABOLIC PANEL - Abnormal; Notable for the following:    Sodium 134 (*)     Chloride 92 (*)     Glucose, Bld 137 (*)     GFR calc non Af Amer 50 (*)     GFR calc Af Amer 58 (*)     All other components within normal limits  URINALYSIS, ROUTINE W REFLEX MICROSCOPIC - Abnormal; Notable for the following:    APPearance CLOUDY (*)     Leukocytes, UA TRACE (*)     All other components within normal limits  URINE MICROSCOPIC-ADD ON - Abnormal; Notable for the following:    Squamous Epithelial / LPF FEW (*)  FEW   All other components within normal limits  POCT I-STAT TROPONIN I   Ct Head Wo Contrast  04/08/2012  *RADIOLOGY REPORT*  Clinical Data: Fall, injury to left sided.  CT HEAD WITHOUT  CONTRAST CT MAXILLOFACIAL WITHOUT CONTRAST  Technique:  Multidetector CT imaging of the head and maxillofacial structures were performed using the standard protocol without intravenous contrast. Multiplanar CT image reconstructions of the maxillofacial structures were also generated.  Comparison:   None.  CT HEAD  Findings: No acute intracranial hemorrhage.  No focal mass lesion. No CT evidence of acute infarction.   No midline shift or mass effect.  No hydrocephalus.  Basilar cisterns are patent.  There is a fluid in the left maxillary sinus and fracture of the left inferior orbital wall described in the face section.  Mastoid air cells are clear.  No skull base fracture evident.  IMPRESSION:  1.  No acute intracranial trauma. 2.  Atrophy and microvascular disease. 3.  Left face fracture described below.  CT MAXILLOFACIAL  Findings:   There is a fracture of the posterior lateral wall of the left maxillary sinus.  This is seen on image 38 of the axial series.  There is a fracture of the anterior wall of the left maxillary sinus (image 36).  There is a small amount of hemorrhage within the left maxillary sinus.  The there is a subtle disruption of the orbital floor on the left as seen on image 26 coronal series.  Intraconal contents are intact on  the left and right. Globes are normal.  No zygomatic arch fracture.  Pterygoid plates are normal.  The frontal sinuses are clear.  There is mucosal thickening in the right maxillary sinus.  The mandibular condyles are located.  No evidence of mandible fracture.  IMPRESSION:  1.  Fracture of the anterior and posterior lateral wall of the left maxillary sinus.  2.  Subtle depressed fracture of the floor of the left orbit.   Original Report Authenticated By: Genevive Bi, M.D.    Ct Maxillofacial Wo Cm  04/08/2012  *RADIOLOGY REPORT*  Clinical Data: Fall, injury to left sided.  CT HEAD WITHOUT CONTRAST CT MAXILLOFACIAL WITHOUT CONTRAST  Technique:  Multidetector CT  imaging of the head and maxillofacial structures were performed using the standard protocol without intravenous contrast. Multiplanar CT image reconstructions of the maxillofacial structures were also generated.  Comparison:   None.  CT HEAD  Findings: No acute intracranial hemorrhage.  No focal mass lesion. No CT evidence of acute infarction.   No midline shift or mass effect.  No hydrocephalus.  Basilar cisterns are patent.  There is a fluid in the left maxillary sinus and fracture of the left inferior orbital wall described in the face section.  Mastoid air cells are clear.  No skull base fracture evident.  IMPRESSION:  1.  No acute intracranial trauma. 2.  Atrophy and microvascular disease. 3.  Left face fracture described below.  CT MAXILLOFACIAL  Findings:   There is a fracture of the posterior lateral wall of the left maxillary sinus.  This is seen on image 38 of the axial series.  There is a fracture of the anterior wall of the left maxillary sinus (image 36).  There is a small amount of hemorrhage within the left maxillary sinus.  The there is a subtle disruption of the orbital floor on the left as seen on image 26 coronal series.  Intraconal contents are intact on the left and right. Globes are normal.  No zygomatic arch fracture.  Pterygoid plates are normal.  The frontal sinuses are clear.  There is mucosal thickening in the right maxillary sinus.  The mandibular condyles are located.  No evidence of mandible fracture.  IMPRESSION:  1.  Fracture of the anterior and posterior lateral wall of the left maxillary sinus.  2.  Subtle depressed fracture of the floor of the left orbit.   Original Report Authenticated By: Genevive Bi, M.D.      1. Syncope   2. Multiple facial fractures     9:22 AM Patient seen and examined. Work-up initiated. Medications ordered.   Vital signs reviewed and are as follows: Filed Vitals:   04/08/12 0919  BP: 171/85  Pulse: 75  Temp:   Resp: 14    Date:  04/08/2012  Rate: 77  Rhythm: normal sinus rhythm  QRS Axis: normal  Intervals: normal  ST/T Wave abnormalities: normal  Conduction Disutrbances:none  Narrative Interpretation:   Old EKG Reviewed: unchanged from 09/24/11  10:53 AM CT findings and patient discussed with Dr. Effie Shy who will see. Will need to admit for syncope/monitoring.   11:21 AM Plan: await on results and decide disposition with family.   3:51 PM Repeat orthostatics show more pronounced orthostasis, however patient is not lightheaded with standing.   Given this, symptoms still concerning for possible cardiac etiology. Will admit for syncope and monitoring.    MDM  Syncope in 78 yo. She has facial fractures which will not require surgical mgmt.  Renne Crigler, [place] 04/08/12 [phone]

## 2012-04-08 NOTE — ED Notes (Signed)
Ice pak applied to her lt. eye

## 2012-04-08 NOTE — ED Notes (Signed)
Pt. Got up to use the bathroom and had a syncopal episode.  Pt. Hit the lt. Side of her head and lt. Rib area.  Pt. Has swelling above her lt. Eye with bruising.  Pt.s lt. Eye is bruised.  She also has pain on inspiration lt. Rib area, no visible marks noted. Pt. Remembers going to the bathroom, and her husband found her on the floor.  Pt. Does not remember.
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
## 2012-04-09 LAB — COMPREHENSIVE METABOLIC PANEL
Albumin: 3.2 g/dL — ABNORMAL LOW (ref 3.5–5.2)
BUN: 13 mg/dL (ref 6–23)
CO2: 29 mEq/L (ref 19–32)
Chloride: 91 mEq/L — ABNORMAL LOW (ref 96–112)
Creatinine, Ser: 1.02 mg/dL (ref 0.50–1.10)
GFR calc non Af Amer: 51 mL/min — ABNORMAL LOW (ref >90)
Total Bilirubin: 0.6 mg/dL (ref 0.3–1.2)

## 2012-04-09 LAB — HEMOGLOBIN A1C: Hgb A1c MFr Bld: 5.9 % — ABNORMAL HIGH (ref <5.7)

## 2012-04-09 LAB — CBC
Platelets: 247 10*3/uL (ref 150–400)
RBC: 3.96 MIL/uL (ref 3.87–5.11)
WBC: 8 10*3/uL (ref 4.0–10.5)

## 2012-04-09 MED ORDER — AMOXICILLIN 500 MG PO CAPS: 500.0000 mg | ORAL_CAPSULE | Freq: Three times a day (TID) | ORAL | Status: DC

## 2012-04-09 MED ORDER — CHLORDIAZEPOXIDE HCL 5 MG PO CAPS: 5.0000 mg | ORAL_CAPSULE | Freq: Two times a day (BID) | ORAL | Status: DC | PRN

## 2012-04-09 MED ORDER — HYDROCHLOROTHIAZIDE 25 MG PO TABS: 12.5000 mg | ORAL_TABLET | Freq: Every day | ORAL | Status: DC

## 2012-04-09 MED ORDER — HYDROCHLOROTHIAZIDE 12.5 MG PO CAPS
12.5000 mg | ORAL_CAPSULE | Freq: Every day | ORAL | Status: DC
  Administered 2012-04-09: 12.5 mg via ORAL
  Filled 2012-04-09: qty 1

## 2012-04-09 NOTE — Progress Notes (Signed)
Physical Therapy Evaluation Patient Details Name: Brooke Beasley MRN: [id number] DOB: 01/02/1931 Today's Date: 04/09/2012 Time: 1140-1210 PT Time Calculation (min): 30 min  PT Assessment / Plan / Recommendation Clinical Impression  78 yo female admitted post fall in bathroom; Presents to PT feeling much better, with no gross functional deficits, at mod I level for ambulation; Pt apparently is at much greater risk of syncope when she is ill; Had a lengthy discussion with pt and daughter re: accepting more help from family, and possibly using her 3in1 commode at bedside when she is sick to prevent syncopal episodes/falls like this one; Pt and daughter verbalized understanding; OK for dc home from PT standpoint; No further pT needs identified; Will sign off    PT Assessment  Patent does not need any further PT services    Follow Up Recommendations  No PT follow up    Does the patient have the potential to tolerate intense rehabilitation      Barriers to Discharge        Equipment Recommendations  None recommended by PT    Recommendations for Other Services     Frequency      Precautions / Restrictions Precautions Precautions: None   Pertinent Vitals/Pain no apparent distress       Mobility  Bed Mobility Bed Mobility: Supine to Sit;Sitting - Scoot to Edge of Bed Supine to Sit: 5: Supervision Sitting - Scoot to Edge of Bed: 5: Supervision Details for Bed Mobility Assistance: Smooth transition without use of rail Transfers Transfers: Sit to Stand;Stand to Sit Sit to Stand: 6: Modified independent (Device/Increase time);From bed Details for Transfer Assistance: Smooth transition Ambulation/Gait Ambulation/Gait Assistance: 5: Supervision;6: Modified independent (Device/Increase time) Ambulation Distance (Feet): 200 Feet (greater tahn) Assistive device: None Ambulation/Gait Assistance Details: Ambulated over 200 feet with no assistive device and no loss of balance; No  reports of dizziness Gait Pattern: Within Functional Limits Gait velocity: approaching normal (slightly hinered by IV pole)    Shoulder Instructions     Exercises     PT Diagnosis:    PT Problem List:   PT Treatment Interventions:     PT Goals    Visit Information  Last PT Received On: 04/09/12 Assistance Needed: +1    Subjective Data  Subjective: Reports feels much better Patient Stated Goal: home   Prior Functioning  Home Living Lives With: Spouse Available Help at Discharge: Other (Comment) (spouse cannot give much physical assist) Type of Home: House Home Access: Stairs to enter Entrance Stairs-Number of Steps: 1 Entrance Stairs-Rails: None (Pt is confident she'll be abl eto manage one step) Home Layout: Two level;Able to live on main level with bedroom/bathroom Home Adaptive Equipment: Bedside commode/3-in-1 (their 3in1 is quite old) Prior Function Level of Independence: Independent Able to Take Stairs?: Yes Driving: Yes Comments: reports has been "waiting on" (i.e. caring for) her husband for a year now Communication Communication: No difficulties    Cognition  Overall Cognitive Status: Appears within functional limits for tasks assessed/performed Arousal/Alertness: Awake/alert Orientation Level: Appears intact for tasks assessed Behavior During Session: [facility] for tasks performed    Extremity/Trunk Assessment Right Upper Extremity Assessment RUE ROM/Strength/Tone: [facility] for tasks assessed Left Upper Extremity Assessment LUE ROM/Strength/Tone: [facility] for tasks assessed Right Lower Extremity Assessment RLE ROM/Strength/Tone: [facility] for tasks assessed Left Lower Extremity Assessment LLE ROM/Strength/Tone: [facility] for tasks assessed Trunk Assessment Trunk Assessment: Normal   Balance    End of Session PT - End of Session Activity Tolerance: Patient tolerated  treatment well Patient left: with family/visitor present (at sink, washing hands in prep for lunch) Nurse  Communication: Mobility status  GP Functional Assessment Tool Used: Clinical judgement Functional Limitation: Mobility: Walking and moving around Mobility: Walking and Moving Around Current Status (O9629): 0 percent impaired, limited or restricted Mobility: Walking and Moving Around Goal Status (B2841): 0 percent impaired, limited or restricted Mobility: Walking and Moving Around Discharge Status [phone]): 0 percent impaired, limited or restricted   Van Clines [facility] [place], [place] [phone]  04/09/2012, 1:24 PM

## 2012-04-09 NOTE — Discharge Summary (Signed)
Physician Discharge Summary  Brooke Beasley ZOX:[id number] DOB: 1930/06/24 DOA: 04/08/2012  PCP: Roxy Manns, MD  Admit date: 04/08/2012 Discharge date: 04/09/2012  Time spent: 30 minutes   Recommendations for Outpatient Follow-up:  1. Needs outpatient blood pressure monitoring-her goal be above 140/90 multiple adjustments have been made to her meds 2. She was polydipsic-Recommend rpt Bmet in 3 days 3. HbA1c was 5.9 4. Recommend ENT referral within 3-5 days  5. Please continue antibiotics until he and he sees the patient 6. Please continue to try to wean her off the Librium   Discharge Diagnoses:  Principal Problem:  *Syncope due to orthostatic hypotension Active Problems:  HYPOTHYROIDISM  HYPERLIPIDEMIA  HYPERTENSION, BENIGN  IBS  OSTEOARTHRITIS  OSTEOPOROSIS  ALLERGY  Heart murmur  Hyperglycemia  Bipolar 1 disorder  Maxillary fracture   Discharge Condition: Good  Diet recommendation: Regular  Filed Weights   04/08/12 0915 04/08/12 1745  Weight: 159 lb (72.122 kg) 160 lb 3.2 oz (72.666 kg)    History of present illness:  78 y.o. female came to [facility] ed 04/08/2012 with a mechanical fall. SHe was noted to have had a fall which was witnessed byt her Husband. Her Husband found her down at the bathroom sink. Husband called elder daughter [name] -and she came over.  She was helped back into the bed. SHe had some repetition of words and she was as in the same questions over and over  She has poor memory of what happened this am. She doesn't take any anti-coagulation or ASA  Emergency room evaluation Cheryl sodium 134 potassium 3.9 chloride of 92  Point-of-care troponin was 0.00  Mild leukocytosis of 10.9 predominantly neutrophilic with no fever  Orthostatics were performed showing aligned blood pressure 179/82, sitting blood pressure 171/85 and 80 standing blood pressure 121/60-she was not notably dizzy on standing however  Imaging showed no acute intracranial trauma,  atrophy and microvascular disease, fracture anti-and posterolateral wall of the left maxillary sinus was subtle depressed fracture of the floor of the left orbit  Chest x-ray showed no evidence of acute cardiopulmonary disease  Rib x-ray showed no acute findings of old left rib and clavicle fractures noted    Hospital Course:  1. Multifactorial syncope-patient has had a workup for this multiple times in the past. She is orthostatic presently. Her blood pressure medications have been adjusted. I have cut her dose back of her HCTZ to 12.5 mg and I have weaned her off of her Librium slowly.  2. Facial fracture-not currently someone who would need inpatient management. ENT will need to see her in 5 days. She started amoxicillin. She should not her nose. I have given her Flonase to inhale and Asacol lozenges for sore throat.  3. Hyponatremia-patient states she was drinking "a lot of water last night" explain her hyponatremia. I recommended her to only drink her regular amount and followup with her primary care physician 4. Hypertension history-she actually has orthostatic hypotension-there is good evidence to show that mortality increases with to try to control the blood pressure in the elderly. We will defer some of this management to her primary care physician however we will cut back her dose of HCTZ from 25 mg >12.5mg . I will hold her losartan completely. 5. Hyperlipidemia-hold Zocor 10 mg daily 6. Bipolar affective disorder-we will continue her imipramine and cut back her chlordiazepoxide which should be discontinued as an outpatient 7. Compensated diastolic CHF with an EF of 50-55% 08/2011-stable 8. ?Imparied glucose tolerance-Get A1c-it was 5.9 9.  Hypothyroid-Outpatient surveillance TSH in 4 weeks    Discharge Exam: Filed Vitals:   04/09/12 0903 04/09/12 0906 04/09/12 0909 04/09/12 1134  BP: 161/84 155/84 156/83 155/85  Pulse: 77 81 84 72  Temp:      TempSrc:      Resp: 18 18 18    Height:       Weight:      SpO2: 97% 98% 96%     Discharge Instructions  Discharge Orders    Future Appointments: Provider: Department: Dept Phone: Center:   09/19/2012 9:15 AM Judy Pimple, MD [facility] [phone] [id number]     Future Orders Please Complete By Expires   Diet - low sodium heart healthy      Increase activity slowly      Call MD for:  persistant nausea and vomiting      Call MD for:  severe uncontrolled pain      Call MD for:  redness, tenderness, or signs of infection (pain, swelling, redness, odor or green/yellow discharge around incision site)      Call MD for:  difficulty breathing, headache or visual disturbances      Call MD for:  persistant dizziness or light-headedness          Medication List     As of 04/09/2012  2:39 PM    STOP taking these medications         terbinafine 250 MG tablet   Commonly known as: LAMISIL      TAKE these medications         amoxicillin 500 MG capsule   Commonly known as: AMOXIL   Take 1 capsule (500 mg total) by mouth every 8 (eight) hours.      chlordiazePOXIDE 5 MG capsule   Commonly known as: LIBRIUM   Take 1 capsule (5 mg total) by mouth 2 (two) times daily as needed for anxiety.      fexofenadine 180 MG tablet   Commonly known as: ALLEGRA   Take 180 mg by mouth daily. Take as needed.      fish oil-omega-3 fatty acids 1000 MG capsule   Take 2 g by mouth 3 (three) times a week.      FLAX SEED OIL PO   Take by mouth 3 (three) times a week.      Garlic 400 MG Tabs   Take by mouth 2 (two) times a week.      hydrochlorothiazide 25 MG tablet   Commonly known as: HYDRODIURIL   Take 0.5 tablets (12.5 mg total) by mouth daily.      imipramine 25 MG tablet   Commonly known as: TOFRANIL   Take 1 tablet (25 mg total) by mouth 2 (two) times daily.      levothyroxine 75 MCG tablet   Commonly known as: SYNTHROID, LEVOTHROID   Take 1 tablet (75 mcg total) by mouth daily.      losartan 50  MG tablet   Commonly known as: COZAAR   Take 11/2 tabs by mouth daily      LUMIGAN 0.01 % Soln   Generic drug: bimatoprost   Apply to eye as needed. Put one drop in both eyes at night.      MIRALAX powder   Generic drug: polyethylene glycol powder   Take by mouth. OTC as directed.      simvastatin 20 MG tablet   Commonly known as: ZOCOR   Take 0.5 tablets (10 mg total) by mouth daily.  Follow-up Information    Follow up with Roxy Manns, MD. Schedule an appointment as soon as possible for a visit in 2 days.   Contact information:   [address] [place] [address] [place] [place] [address] [phone]           The results of significant diagnostics from this hospitalization (including imaging, microbiology, ancillary and laboratory) are listed below for reference.    Significant Diagnostic Studies: Dg Chest 2 View  04/08/2012  *RADIOLOGY REPORT*  Clinical Data: Fall, left rib pain  CHEST - 2 VIEW  Comparison: 09/24/2011  Findings: Lungs are clear. No pleural effusion or pneumothorax.  Cardiomediastinal silhouette is within normal limits.  Thoracolumbar scoliosis with degenerative changes.  Old left 7th rib fracture deformity.  IMPRESSION: No evidence of acute cardiopulmonary disease.   Original Report Authenticated By: Charline Bills, M.D.    Dg Ribs Unilateral Left  04/08/2012  *RADIOLOGY REPORT*  Clinical Data: Fall.  Left rib pain.  LEFT RIBS - 2 VIEW  Comparison: 09/24/2011  Findings: No acute left rib fracture identified.  Old fracture deformities of several left lateral ribs in the left clavicle are again seen.  No other bone lesions identified.  No evidence of left- sided pneumothorax or hemothorax.  IMPRESSION: No acute findings.  Old left rib and clavicle fractures again noted.   Original Report Authenticated By: Myles Rosenthal, M.D.    Ct Head Wo Contrast  04/08/2012  *RADIOLOGY REPORT*  Clinical Data: Fall, injury to left sided.  CT HEAD  WITHOUT CONTRAST CT MAXILLOFACIAL WITHOUT CONTRAST  Technique:  Multidetector CT imaging of the head and maxillofacial structures were performed using the standard protocol without intravenous contrast. Multiplanar CT image reconstructions of the maxillofacial structures were also generated.  Comparison:   None.  CT HEAD  Findings: No acute intracranial hemorrhage.  No focal mass lesion. No CT evidence of acute infarction.   No midline shift or mass effect.  No hydrocephalus.  Basilar cisterns are patent.  There is a fluid in the left maxillary sinus and fracture of the left inferior orbital wall described in the face section.  Mastoid air cells are clear.  No skull base fracture evident.  IMPRESSION:  1.  No acute intracranial trauma. 2.  Atrophy and microvascular disease. 3.  Left face fracture described below.  CT MAXILLOFACIAL  Findings:   There is a fracture of the posterior lateral wall of the left maxillary sinus.  This is seen on image 38 of the axial series.  There is a fracture of the anterior wall of the left maxillary sinus (image 36).  There is a small amount of hemorrhage within the left maxillary sinus.  The there is a subtle disruption of the orbital floor on the left as seen on image 26 coronal series.  Intraconal contents are intact on the left and right. Globes are normal.  No zygomatic arch fracture.  Pterygoid plates are normal.  The frontal sinuses are clear.  There is mucosal thickening in the right maxillary sinus.  The mandibular condyles are located.  No evidence of mandible fracture.  IMPRESSION:  1.  Fracture of the anterior and posterior lateral wall of the left maxillary sinus.  2.  Subtle depressed fracture of the floor of the left orbit.   Original Report Authenticated By: Genevive Bi, M.D.    Ct Maxillofacial Wo Cm  04/08/2012  *RADIOLOGY REPORT*  Clinical Data: Fall, injury to left sided.  CT HEAD WITHOUT CONTRAST CT MAXILLOFACIAL  WITHOUT CONTRAST  Technique:  Multidetector  CT imaging of the head and maxillofacial structures were performed using the standard protocol without intravenous contrast. Multiplanar CT image reconstructions of the maxillofacial structures were also generated.  Comparison:   None.  CT HEAD  Findings: No acute intracranial hemorrhage.  No focal mass lesion. No CT evidence of acute infarction.   No midline shift or mass effect.  No hydrocephalus.  Basilar cisterns are patent.  There is a fluid in the left maxillary sinus and fracture of the left inferior orbital wall described in the face section.  Mastoid air cells are clear.  No skull base fracture evident.  IMPRESSION:  1.  No acute intracranial trauma. 2.  Atrophy and microvascular disease. 3.  Left face fracture described below.  CT MAXILLOFACIAL  Findings:   There is a fracture of the posterior lateral wall of the left maxillary sinus.  This is seen on image 38 of the axial series.  There is a fracture of the anterior wall of the left maxillary sinus (image 36).  There is a small amount of hemorrhage within the left maxillary sinus.  The there is a subtle disruption of the orbital floor on the left as seen on image 26 coronal series.  Intraconal contents are intact on the left and right. Globes are normal.  No zygomatic arch fracture.  Pterygoid plates are normal.  The frontal sinuses are clear.  There is mucosal thickening in the right maxillary sinus.  The mandibular condyles are located.  No evidence of mandible fracture.  IMPRESSION:  1.  Fracture of the anterior and posterior lateral wall of the left maxillary sinus.  2.  Subtle depressed fracture of the floor of the left orbit.   Original Report Authenticated By: Genevive Bi, M.D.     Microbiology: No results found for this or any previous visit (from the past 240 hour(s)).   Labs: Basic Metabolic Panel:  Lab 04/09/12 2956 04/08/12 0900  NA 129* 134*  K 3.0* 3.9  CL 91* 92*  CO2 29 31  GLUCOSE 104* 137*  BUN 13 13  CREATININE 1.02  1.03  CALCIUM 8.8 10.3  MG -- --  PHOS -- --   Liver Function Tests:  Lab 04/09/12 0622  AST 19  ALT 11  ALKPHOS 106  BILITOT 0.6  PROT 5.9*  ALBUMIN 3.2*   No results found for this basename: LIPASE:5,AMYLASE:5 in the last 168 hours No results found for this basename: AMMONIA:5 in the last 168 hours CBC:  Lab 04/09/12 0622 04/08/12 0900  WBC 8.0 10.9*  NEUTROABS -- 8.4*  HGB 12.2 15.0  HCT 35.5* 42.5  MCV 89.6 89.1  PLT 247 284   Cardiac Enzymes: No results found for this basename: CKTOTAL:5,CKMB:5,CKMBINDEX:5,TROPONINI:5 in the last 168 hours BNP: BNP (last 3 results) No results found for this basename: PROBNP:3 in the last 8760 hours CBG: No results found for this basename: GLUCAP:5 in the last 168 hours     Signed:  Rhetta Mura  [facility] 04/09/2012, 2:39 PM

## 2012-04-11 ENCOUNTER — Ambulatory Visit (INDEPENDENT_AMBULATORY_CARE_PROVIDER_SITE_OTHER): Payer: Medicare Other | Admitting: Family Medicine

## 2012-04-11 ENCOUNTER — Encounter: Payer: Self-pay | Admitting: Family Medicine

## 2012-04-11 VITALS — BP 130/73 | HR 78 | Temp 97.3°F | Ht 62.0 in | Wt 161.2 lb

## 2012-04-11 DIAGNOSIS — I951 Orthostatic hypotension: Secondary | ICD-10-CM

## 2012-04-11 DIAGNOSIS — R55 Syncope and collapse: Secondary | ICD-10-CM

## 2012-04-11 DIAGNOSIS — I10 Essential (primary) hypertension: Secondary | ICD-10-CM

## 2012-04-11 DIAGNOSIS — S02401A Maxillary fracture, unspecified, initial encounter for closed fracture: Secondary | ICD-10-CM

## 2012-04-11 LAB — BASIC METABOLIC PANEL
GFR: 50.87 mL/min — ABNORMAL LOW (ref >60.00)
Potassium: 3.5 mEq/L (ref 3.5–5.1)
Sodium: 130 mEq/L — ABNORMAL LOW (ref 135–145)

## 2012-04-11 MED ORDER — SIMVASTATIN 20 MG PO TABS: 10.0000 mg | ORAL_TABLET | Freq: Every day | ORAL | Status: DC

## 2012-04-11 MED ORDER — LEVOTHYROXINE SODIUM 75 MCG PO TABS: 75.0000 ug | ORAL_TABLET | Freq: Every day | ORAL | Status: DC

## 2012-04-11 MED ORDER — HYDROCHLOROTHIAZIDE 25 MG PO TABS: 12.5000 mg | ORAL_TABLET | Freq: Every day | ORAL | Status: DC

## 2012-04-11 MED ORDER — LOSARTAN POTASSIUM 50 MG PO TABS: ORAL_TABLET | ORAL | Status: DC

## 2012-04-11 MED ORDER — IMIPRAMINE HCL 25 MG PO TABS: 25.0000 mg | ORAL_TABLET | Freq: Two times a day (BID) | ORAL | Status: DC

## 2012-04-11 NOTE — Patient Instructions (Addendum)
Use bedside commode  Use heat on ribs for 10 minutes at a time  Wean your librium- take daily, then every other day, then every 3rd day, then stop  Then take it only if needed for severe anxiety  Cut hctz in 1/2 and only take a half pill daily  Stay with losartan 1 pill daily (cut from 11/2)  Sit and stand slowly- wait before walking  Tylenol is ok for pain  We will do ENT referral at check out  Re checking some labs toay  For runny nose - flonase and you can try zyrtec 10 mg at a half pill once daily

## 2012-04-11 NOTE — Progress Notes (Signed)
Subjective:    Patient ID: Brooke Beasley, female    DOB: [birth year], 78 y.o.   MRN: [id number]  HPI Here for f/u hosp 12/14-12/15 Had an episode of vasovagal syncope-and was found to have orthostatic hypotension in ER Sustained maxillary sinus fx -is on amox na dneeds to see ENT Also hyponatremia and mild hyperglycemia  Has a family hx of syncope- mother and brother - and she herself has been worked up for it   Is now using a bedside commode    She has had a hx of syncope in the past - but only when she is sick or weak  She had been fighting a cold  Is still coughing     Chemistry      Component Value Date/Time   NA 129* 04/09/2012 0622   K 3.0* 04/09/2012 0622   CL 91* 04/09/2012 0622   CO2 29 04/09/2012 0622   BUN 13 04/09/2012 0622   CREATININE 1.02 04/09/2012 0622      Component Value Date/Time   CALCIUM 8.8 04/09/2012 0622   ALKPHOS 106 04/09/2012 0622   AST 19 04/09/2012 0622   ALT 11 04/09/2012 0622   BILITOT 0.6 04/09/2012 0622      Lab Results  Component Value Date   HGBA1C 5.9* 04/09/2012   pt stated she was thirsty that night and drank a lot of water   Hospital doctor did cut her hct to 12.5 mg  In hosp held losartan -now is taking 1 instead of 11/2    Needs repeat labs   bp is 130/73 today  Librium was weaned in the hospital as well - goal is to get off of it due to risk of syncope and falls  Review of Systems Review of Systems  Constitutional: Negative for fever, appetite change, fatigue and unexpected weight change.  Eyes: Negative for pain and visual disturbance.  Respiratory: Negative for cough and shortness of breath.   Cardiovascular: Negative for cp or palpitations    Gastrointestinal: Negative for nausea, diarrhea and constipation.  Genitourinary: Negative for urgency and frequency.  Skin: Negative for pallor or rash   MSK pos for L rib / chest wall pain from recent trauma, neg for skin change/ swelling  / ecchymosis  Neurological:  Negative for weakness, light-headedness, numbness and headaches.  Hematological: Negative for adenopathy. Does not bruise/bleed easily.  Psychiatric/Behavioral: Negative for dysphoric mood. The patient is not nervous/anxious.         Objective:   Physical Exam  Constitutional: She is oriented to person, place, and time. She appears well-developed and well-nourished. No distress.  HENT:  Head: Normocephalic.  Right Ear: External ear normal.  Left Ear: External ear normal.  Mouth/Throat: Oropharynx is clear and moist. No oropharyngeal exudate.       Nares are boggy  Ecchymosis over face/ traumatic changes Tenderness over L face and maxillary sinus  Throat is clear  Eyes: Conjunctivae normal and EOM are normal. Pupils are equal, round, and reactive to light. Right eye exhibits no discharge. Left eye exhibits no discharge. No scleral icterus.  Neck: Normal range of motion. Neck supple. Carotid bruit is not present. No tracheal deviation present. No thyromegaly present.  Cardiovascular: Normal rate, regular rhythm, normal heart sounds and intact distal pulses.  Exam reveals no gallop.        Not orthostatic today- just a drop of 5 pt systolic from sitting to standing   Pulmonary/Chest: Effort normal and breath sounds normal. No respiratory distress.  She has no wheezes. She exhibits tenderness.       L rib tenderness-no crepitus or skin change  Abdominal: Soft. Bowel sounds are normal. There is no tenderness.  Musculoskeletal: Normal range of motion. She exhibits tenderness. She exhibits no edema.  Lymphadenopathy:    She has no cervical adenopathy.  Neurological: She is alert and oriented to person, place, and time. She has normal reflexes. She displays no tremor. No cranial nerve deficit. She exhibits normal muscle tone. Coordination and gait normal.  Skin: Skin is warm and dry. No rash noted. No erythema. No pallor.  Psychiatric: She has a normal mood and affect.          Assessment  & Plan:

## 2012-04-16 NOTE — Assessment & Plan Note (Signed)
Rev hosp notes in detail  Ref to ENT to follow maxillary sinus Cut her hctz Not orthostatic today This happens when she is sick  Do recommend use of bedside commode at night  Try to get off libruim Disc all fall risks

## 2012-04-16 NOTE — Assessment & Plan Note (Signed)
Pt is doing fairly well ENT ref done

## 2012-04-16 NOTE — Assessment & Plan Note (Signed)
Cut hctz for orthostatic hypotension and obs Pt not orthostatic today  This tends to happen only when she is ill

## 2012-04-18 ENCOUNTER — Other Ambulatory Visit: Payer: Self-pay | Admitting: *Deleted

## 2012-04-18 MED ORDER — LEVOTHYROXINE SODIUM 75 MCG PO TABS
75.0000 ug | ORAL_TABLET | Freq: Every day | ORAL | Status: DC
Start: 2012-04-18 — End: 2013-02-14

## 2012-04-18 MED ORDER — LOSARTAN POTASSIUM 50 MG PO TABS: ORAL_TABLET | ORAL | Status: DC

## 2012-04-18 MED ORDER — SIMVASTATIN 20 MG PO TABS: 10.0000 mg | ORAL_TABLET | Freq: Every day | ORAL | Status: DC

## 2012-04-18 MED ORDER — IMIPRAMINE HCL 25 MG PO TABS: 25.0000 mg | ORAL_TABLET | Freq: Two times a day (BID) | ORAL | Status: DC

## 2012-05-03 ENCOUNTER — Other Ambulatory Visit: Payer: Medicare Other

## 2012-05-04 ENCOUNTER — Other Ambulatory Visit (INDEPENDENT_AMBULATORY_CARE_PROVIDER_SITE_OTHER): Payer: Medicare Other

## 2012-05-04 DIAGNOSIS — E871 Hypo-osmolality and hyponatremia: Secondary | ICD-10-CM

## 2012-05-04 LAB — BASIC METABOLIC PANEL
CO2: 29 mEq/L (ref 19–32)
Chloride: 98 mEq/L (ref 96–112)
Sodium: 136 mEq/L (ref 135–145)

## 2012-05-05 ENCOUNTER — Encounter: Payer: Self-pay | Admitting: *Deleted

## 2012-06-10 ENCOUNTER — Other Ambulatory Visit: Payer: Self-pay

## 2012-06-23 ENCOUNTER — Other Ambulatory Visit (INDEPENDENT_AMBULATORY_CARE_PROVIDER_SITE_OTHER): Payer: Medicare Other

## 2012-06-23 DIAGNOSIS — Z1211 Encounter for screening for malignant neoplasm of colon: Secondary | ICD-10-CM

## 2012-07-10 ENCOUNTER — Encounter: Payer: Self-pay | Admitting: Family Medicine

## 2012-07-10 ENCOUNTER — Ambulatory Visit (INDEPENDENT_AMBULATORY_CARE_PROVIDER_SITE_OTHER): Payer: Medicare Other | Admitting: Family Medicine

## 2012-07-10 VITALS — BP 142/78 | HR 102 | Temp 98.2°F | Ht 62.0 in | Wt 160.2 lb

## 2012-07-10 DIAGNOSIS — F411 Generalized anxiety disorder: Secondary | ICD-10-CM

## 2012-07-10 DIAGNOSIS — I10 Essential (primary) hypertension: Secondary | ICD-10-CM

## 2012-07-10 NOTE — Assessment & Plan Note (Signed)
Using librium now only when necessary- and no episodes of syncope/ also no falls  Feel better about this  Pt feels ok

## 2012-07-10 NOTE — Assessment & Plan Note (Signed)
Doing ok with less medication - losartan and hct doses cut No orthostasis or syncope whatsoever Will continue to follow F/u 3 mo and will do labs that day

## 2012-07-10 NOTE — Progress Notes (Signed)
Subjective:    Patient ID: Brooke Beasley, female    DOB: 01-21-1931, 79 y.o.   MRN: [id number]  HPI Here for HTN f/u  Was having orthostasis (when sick)  Did cut her losartan and hct   Also was to cut back on librium  Now she only takes it if she really needs it    bp is ok  142/78 Up a bit but still within acceptable range  No more episodes at all and feeling ok  No swelling in ankles   Patient Active Problem List  Diagnosis  . HYPOTHYROIDISM  . HYPERLIPIDEMIA  . ANXIETY  . DEPRESSION  . GLAUCOMA  . HYPERTENSION, BENIGN  . HEMORRHOIDS, INTERNAL  . CONSTIPATION, CHRONIC  . IBS  . NIPPLE DISCHARGE  . OSTEOARTHRITIS  . OSTEOPOROSIS  . EDEMA  . ALLERGY  . Preventative health care  . Neck pain  . Syncope  . Heart murmur  . Colon cancer screening  . Hyperglycemia  . Syncope due to orthostatic hypotension  . Bipolar 1 disorder  . Maxillary fracture   Past Medical History  Diagnosis Date  . Osteoarthritis   . Anxiety   . Depression     nervous breakdonw in 1964-out of work for a year  . Hyperlipidemia   . Thyroid disease     Hypothyroidism  . Osteoporosis   . History of shingles     In past  . Intestinal bacterial overgrowth     In small colon  . Hypertension   . Degenerative disc disease     LS  . Heart murmur 5/13    mitral regurge - on echo   . Hypothyroidism   . GERD (gastroesophageal reflux disease)    Past Surgical History  Procedure Laterality Date  . Tonsillectomy    . Bowel obstruction  1999  . Colonoscopy  1. 1999  2. 11/04    1. Not finished  2. Slight hemorrhage rectosigmoid area  . Dexa  1. 4098-1191   2. 9/04  3. 3/08     1. OP  2. OP, borderline, spine -2.44T  3. decreased BMD-OP  . Diverticulosis    . Esophagogastroduodenoscopy      Negative  . Hemorrhoid procedure  4/08  . Dg knee 1-2 views bilat      LS x-ray with degenerative disc and facet change  . US echocardiography  07/2011    Normal sys fxn with EF 55-60%.  Focal  basal septal hypertrophy.  Mild diastolic dysfunction.  Mild MR.   History  Substance Use Topics  . Smoking status: Never Smoker   . Smokeless tobacco: Not on file  . Alcohol Use: No   Family History  Problem Relation Age of Onset  . Diabetes Brother     post-op  . Coronary artery disease Brother   . Diabetes Paternal Grandfather    Allergies  Allergen Reactions  . Ace Inhibitors     REACTION: cough  . Alendronate Sodium     REACTION: palpitations  . Paroxetine Hcl     Not effective   . Risedronate Sodium     REACTION: joint pain  . Simvastatin     REACTION: the full 20 mg pill causes leg pain- can tol 10 mg  . Sulfonamide Derivatives     REACTION: rash   Current Outpatient Prescriptions on File Prior to Visit  Medication Sig Dispense Refill  . Bimatoprost (LUMIGAN) 0.01 % SOLN Apply to eye as needed. Put one drop in  both eyes at night.      . chlordiazePOXIDE (LIBRIUM) 5 MG capsule Take 5 mg by mouth daily as needed. For severe anxiety only      . fish oil-omega-3 fatty acids 1000 MG capsule Take 2 g by mouth 3 (three) times a week.      . Flaxseed, Linseed, (FLAX SEED OIL PO) Take by mouth 3 (three) times a week.      . Garlic 400 MG TABS Take by mouth 2 (two) times a week.      . hydrochlorothiazide (HYDRODIURIL) 25 MG tablet Take 0.5 tablets (12.5 mg total) by mouth daily.  90 tablet  3  . imipramine (TOFRANIL) 25 MG tablet Take 1 tablet (25 mg total) by mouth 2 (two) times daily.  180 tablet  3  . levothyroxine (SYNTHROID, LEVOTHROID) 75 MCG tablet Take 1 tablet (75 mcg total) by mouth daily.  90 tablet  3  . losartan (COZAAR) 50 MG tablet Take 1 tab by mouth once daily  90 tablet  3  . polyethylene glycol powder (MIRALAX) powder Take by mouth. OTC as directed.      . simvastatin (ZOCOR) 20 MG tablet Take 0.5 tablets (10 mg total) by mouth daily.  45 tablet  3   No current facility-administered medications on file prior to visit.      Review of Systems Review of  Systems  Constitutional: Negative for fever, appetite change, fatigue and unexpected weight change. neg for weakness Eyes: Negative for pain and visual disturbance.  Respiratory: Negative for cough and shortness of breath.   Cardiovascular: Negative for cp or palpitations    Gastrointestinal: Negative for nausea, diarrhea and constipation.  Genitourinary: Negative for urgency and frequency.  Skin: Negative for pallor or rash   Neurological: Negative for weakness, light-headedness, numbness and headaches. neg for syncope  Hematological: Negative for adenopathy. Does not bruise/bleed easily.  Psychiatric/Behavioral: Negative for dysphoric mood. The patient is not nervous/anxious.         Objective:   Physical Exam  Constitutional: She appears well-developed and well-nourished. No distress.  HENT:  Head: Normocephalic and atraumatic.  Mouth/Throat: Oropharynx is clear and moist.  Eyes: Conjunctivae and EOM are normal. Pupils are equal, round, and reactive to light. Right eye exhibits no discharge. Left eye exhibits no discharge. No scleral icterus.  Neck: Normal range of motion. Neck supple. No JVD present. Carotid bruit is not present. No thyromegaly present.  Cardiovascular: Normal rate, regular rhythm, normal heart sounds and intact distal pulses.  Exam reveals no gallop.   Pulmonary/Chest: Effort normal and breath sounds normal. No respiratory distress. She has no wheezes.  Abdominal: Soft. Bowel sounds are normal. She exhibits no distension, no abdominal bruit and no mass. There is no tenderness.  Lymphadenopathy:    She has no cervical adenopathy.  Neurological: She is alert. She has normal reflexes.  Skin: Skin is warm and dry. No rash noted. No pallor.  Psychiatric: She has a normal mood and affect.          Assessment & Plan:

## 2012-07-10 NOTE — Patient Instructions (Addendum)
Your blood pressure is ok on current doses  Continue your current medicines without change Only take the anxiety medicine when absolutely necessary  Follow up in 3 months

## 2012-08-04 ENCOUNTER — Other Ambulatory Visit: Payer: Self-pay | Admitting: *Deleted

## 2012-08-04 MED ORDER — CHLORDIAZEPOXIDE HCL 5 MG PO CAPS: 5.0000 mg | ORAL_CAPSULE | Freq: Every day | ORAL | Status: DC | PRN

## 2012-08-04 NOTE — Telephone Encounter (Signed)
Last filled 05/10/12, 12/13 ov mentioned pt weaning off med.Is it ok to refill?

## 2012-08-04 NOTE — Telephone Encounter (Signed)
Rx called in as prescribed, called in to [facility] not [facility] ([facility] is who faxed [place] request)

## 2012-08-04 NOTE — Telephone Encounter (Signed)
She is taking it less now but still has it Px written for call in

## 2012-09-19 ENCOUNTER — Ambulatory Visit: Payer: Medicare Other | Admitting: Family Medicine

## 2012-09-27 ENCOUNTER — Ambulatory Visit (INDEPENDENT_AMBULATORY_CARE_PROVIDER_SITE_OTHER): Payer: Medicare Other | Admitting: Family Medicine

## 2012-09-27 ENCOUNTER — Encounter: Payer: Self-pay | Admitting: Family Medicine

## 2012-09-27 VITALS — BP 140/80 | HR 94 | Temp 98.0°F | Ht 62.0 in | Wt 158.0 lb

## 2012-09-27 DIAGNOSIS — I10 Essential (primary) hypertension: Secondary | ICD-10-CM

## 2012-09-27 DIAGNOSIS — R7309 Other abnormal glucose: Secondary | ICD-10-CM

## 2012-09-27 DIAGNOSIS — R739 Hyperglycemia, unspecified: Secondary | ICD-10-CM

## 2012-09-27 DIAGNOSIS — R609 Edema, unspecified: Secondary | ICD-10-CM

## 2012-09-27 DIAGNOSIS — E039 Hypothyroidism, unspecified: Secondary | ICD-10-CM

## 2012-09-27 DIAGNOSIS — Z0000 Encounter for general adult medical examination without abnormal findings: Secondary | ICD-10-CM

## 2012-09-27 DIAGNOSIS — E785 Hyperlipidemia, unspecified: Secondary | ICD-10-CM

## 2012-09-27 DIAGNOSIS — F411 Generalized anxiety disorder: Secondary | ICD-10-CM

## 2012-09-27 DIAGNOSIS — J209 Acute bronchitis, unspecified: Secondary | ICD-10-CM

## 2012-09-27 LAB — CBC WITH DIFFERENTIAL/PLATELET
Basophils Absolute: 0 10*3/uL (ref 0.0–0.1)
Eosinophils Absolute: 0.2 10*3/uL (ref 0.0–0.7)
Eosinophils Relative: 2.8 % (ref 0.0–5.0)
MCV: 93.1 fl (ref 78.0–100.0)
Monocytes Absolute: 0.9 10*3/uL (ref 0.1–1.0)
Neutrophils Relative %: 62.8 % (ref 43.0–77.0)
Platelets: 255 10*3/uL (ref 150.0–400.0)
RDW: 13.5 % (ref 11.5–14.6)
WBC: 7.8 10*3/uL (ref 4.5–10.5)

## 2012-09-27 LAB — COMPREHENSIVE METABOLIC PANEL
Albumin: 3.9 g/dL (ref 3.5–5.2)
Alkaline Phosphatase: 93 U/L (ref 39–117)
CO2: 31 mEq/L (ref 19–32)
Glucose, Bld: 106 mg/dL — ABNORMAL HIGH (ref 70–99)
Potassium: 3.9 mEq/L (ref 3.5–5.1)
Sodium: 139 mEq/L (ref 135–145)
Total Protein: 7 g/dL (ref 6.0–8.3)

## 2012-09-27 LAB — LIPID PANEL
Cholesterol: 163 mg/dL (ref 0–200)
VLDL: 18 mg/dL (ref 0.0–40.0)

## 2012-09-27 LAB — TSH: TSH: 2.94 u[IU]/mL (ref 0.35–5.50)

## 2012-09-27 MED ORDER — AZITHROMYCIN 250 MG PO TABS: ORAL_TABLET | ORAL | Status: DC

## 2012-09-27 NOTE — Assessment & Plan Note (Signed)
a1c today Disc healthy low sugar diet

## 2012-09-27 NOTE — Assessment & Plan Note (Signed)
bp 140/80 on 2nd check Doing well with lower doses of arb and diuretic Lab today

## 2012-09-27 NOTE — Progress Notes (Signed)
Subjective:    Patient ID: Brooke Beasley, female    DOB: 20-Apr-1931, 79 y.o.   MRN: [id number]  HPI Here for f/u of HTN   Is coming down with a cold right now Makes her more nervous  Started with post nasal drip  Followed by a scratchy throat and hoarseness and cough Used a cough drop  ? In past if mucinex gave her problems  She takes zyrtec  Cough is productive - color is light yellow  No wheeze or sob  No fever    Worried it could get worse    Is only taking librium as needed  Does not sleep well  Feels anxious    Had exp orthostatic hypotension earlier in the year and cut her cozaar and hct  bp is stable today  No cp or palpitations or headaches or edema  No side effects to medicines  BP Readings from Last 3 Encounters:  09/27/12 138/96  07/10/12 142/78  04/11/12 130/73     Wt is down 2 lb  Hx of hyperglycemia Lab Results  Component Value Date   HGBA1C 5.9* 04/09/2012   due for lab No excessive thirst or urination or numbness  Patient Active Problem List   Diagnosis Date Noted  . Syncope due to orthostatic hypotension 04/08/2012  . Bipolar 1 disorder 04/08/2012  . Maxillary fracture 04/08/2012  . Colon cancer screening 03/20/2012  . Hyperglycemia 03/20/2012  . Heart murmur 09/13/2011  . Syncope 08/02/2011  . Neck pain 03/22/2011  . Preventative health care 12/15/2010  . EDEMA 02/16/2008  . HYPERTENSION, BENIGN 02/24/2007  . HYPOTHYROIDISM 11/02/2006  . HYPERLIPIDEMIA 11/02/2006  . ANXIETY 11/02/2006  . DEPRESSION 11/02/2006  . GLAUCOMA 11/02/2006  . HEMORRHOIDS, INTERNAL 11/02/2006  . CONSTIPATION, CHRONIC 11/02/2006  . IBS 11/02/2006  . NIPPLE DISCHARGE 11/02/2006  . OSTEOPOROSIS 11/02/2006  . ALLERGY 11/02/2006  . OSTEOARTHRITIS 10/20/2006   Past Medical History  Diagnosis Date  . Osteoarthritis   . Anxiety   . Depression     nervous breakdonw in 1964-out of work for a year  . Hyperlipidemia   . Thyroid disease     Hypothyroidism   . Osteoporosis   . History of shingles     In past  . Intestinal bacterial overgrowth     In small colon  . Hypertension   . Degenerative disc disease     LS  . Heart murmur 5/13    mitral regurge - on echo   . Hypothyroidism   . GERD (gastroesophageal reflux disease)    Past Surgical History  Procedure Laterality Date  . Tonsillectomy    . Bowel obstruction  1999  . Colonoscopy  1. 1999  2. 11/04    1. Not finished  2. Slight hemorrhage rectosigmoid area  . Dexa  1. 4098-1191   2. 9/04  3. 3/08     1. OP  2. OP, borderline, spine -2.44T  3. decreased BMD-OP  . Diverticulosis    . Esophagogastroduodenoscopy      Negative  . Hemorrhoid procedure  4/08  . Dg knee 1-2 views bilat      LS x-ray with degenerative disc and facet change  . US echocardiography  07/2011    Normal sys fxn with EF 55-60%.  Focal basal septal hypertrophy.  Mild diastolic dysfunction.  Mild MR.   History  Substance Use Topics  . Smoking status: Never Smoker   . Smokeless tobacco: Not on file  . Alcohol Use: No  Family History  Problem Relation Age of Onset  . Diabetes Brother     post-op  . Coronary artery disease Brother   . Diabetes Paternal Grandfather    Allergies  Allergen Reactions  . Ace Inhibitors     REACTION: cough  . Alendronate Sodium     REACTION: palpitations  . Paroxetine Hcl     Not effective   . Risedronate Sodium     REACTION: joint pain  . Simvastatin     REACTION: the full 20 mg pill causes leg pain- can tol 10 mg  . Sulfonamide Derivatives     REACTION: rash   Current Outpatient Prescriptions on File Prior to Visit  Medication Sig Dispense Refill  . Bimatoprost (LUMIGAN) 0.01 % SOLN Apply to eye at bedtime. Put one drop in both eyes at night.      . chlordiazePOXIDE (LIBRIUM) 5 MG capsule Take 1 capsule (5 mg total) by mouth daily as needed for anxiety. For severe anxiety only  30 capsule  0  . fish oil-omega-3 fatty acids 1000 MG capsule Take 2 g by mouth 3  (three) times a week.      . Flaxseed, Linseed, (FLAX SEED OIL PO) Take by mouth 3 (three) times a week.      . Garlic 400 MG TABS Take by mouth 2 (two) times a week.      . hydrochlorothiazide (HYDRODIURIL) 25 MG tablet Take 0.5 tablets (12.5 mg total) by mouth daily.  90 tablet  3  . imipramine (TOFRANIL) 25 MG tablet Take 1 tablet (25 mg total) by mouth 2 (two) times daily.  180 tablet  3  . levothyroxine (SYNTHROID, LEVOTHROID) 75 MCG tablet Take 1 tablet (75 mcg total) by mouth daily.  90 tablet  3  . losartan (COZAAR) 50 MG tablet Take 1 tab by mouth once daily  90 tablet  3  . polyethylene glycol powder (MIRALAX) powder Take by mouth. OTC as directed.      . simvastatin (ZOCOR) 20 MG tablet Take 0.5 tablets (10 mg total) by mouth daily.  45 tablet  3   No current facility-administered medications on file prior to visit.    Review of Systems Review of Systems  Constitutional: Negative for fever, appetite change, and unexpected weight change.  ENT pos for congestion and drainage and hoarseness/ neg for sinus pain  Eyes: Negative for pain and visual disturbance.  Respiratory: Negative for wheeze  and shortness of breath.   Cardiovascular: Negative for cp or palpitations    Gastrointestinal: Negative for nausea, diarrhea and constipation.  Genitourinary: Negative for urgency and frequency.  Skin: Negative for pallor or rash   Neurological: Negative for weakness, light-headedness, numbness and headaches.  Hematological: Negative for adenopathy. Does not bruise/bleed easily.  Psychiatric/Behavioral: Negative for dysphoric mood. The patient is not nervous/anxious.         Objective:   Physical Exam  Constitutional: She appears well-developed and well-nourished. No distress.  HENT:  Head: Normocephalic and atraumatic.  Right Ear: External ear normal.  Left Ear: External ear normal.  Mouth/Throat: Oropharynx is clear and moist. No oropharyngeal exudate.  Nares are injected and  congested  Clear rhinorrhea No sinus tenderness  Eyes: Conjunctivae and EOM are normal. Pupils are equal, round, and reactive to light. Right eye exhibits no discharge. Left eye exhibits no discharge. No scleral icterus.  Neck: Normal range of motion. Neck supple. No JVD present. Carotid bruit is not present. No thyromegaly present.  Cardiovascular: Normal  rate, regular rhythm, normal heart sounds and intact distal pulses.  Exam reveals no gallop.   Pulmonary/Chest: Effort normal and breath sounds normal. No respiratory distress. She has no wheezes. She has no rales. She exhibits no tenderness.  Harsh bs occ cough  Abdominal: Soft. Bowel sounds are normal. She exhibits no abdominal bruit.  Musculoskeletal: She exhibits no edema.  Lymphadenopathy:    She has no cervical adenopathy.  Neurological: She is alert. She has normal reflexes. No cranial nerve deficit. She exhibits normal muscle tone. Coordination normal.  Skin: Skin is warm and dry. No rash noted. No erythema. No pallor.  Psychiatric: She has a normal mood and affect.          Assessment & Plan:

## 2012-09-27 NOTE — Patient Instructions (Addendum)
Cancel appt for June 17th, thanks  Take the zithromax as directed  Drink fluids/ take zyrtec / rest and try salt water gargle for throat pain / try nasal saline spray for congestion  If you want to see a psychiatrist for anxiety in the future - let me know and I will refer you  Labs today

## 2012-09-27 NOTE — Assessment & Plan Note (Signed)
Pt takes librium prn only (avoiding regular dosing to to orthostatic hypotension/fall) - she has failed ssri in past  Still anxious-and unhappy about this Disc risks of diff meds with age  Offered psychiatry appt -she will think about that

## 2012-09-27 NOTE — Assessment & Plan Note (Signed)
Reassuring exam but pt is sure she has more than a cold and is worried about cough  Cover with zpack Disc symptomatic care - see instructions on AVS  Will avoid mucinex for now - ? If side eff in past

## 2012-10-03 ENCOUNTER — Telehealth: Payer: Self-pay | Admitting: Family Medicine

## 2012-10-03 NOTE — Telephone Encounter (Signed)
That is fine with me.

## 2012-10-03 NOTE — Telephone Encounter (Signed)
Brooke Beasley would like to change PCP from Dr. Milinda Antis to Dr. Sharen Hones.  If this is OK with both of you, please let me know and I will schedule the appointment.    Dr. Sharen Hones, would you like this to be a new pt appointment?  Best number for pt [phone].

## 2012-10-03 NOTE — Telephone Encounter (Signed)
Fine by me.  Yes let's make this a 30 min appt.

## 2012-10-10 ENCOUNTER — Ambulatory Visit: Payer: Medicare Other | Admitting: Family Medicine

## 2012-12-05 ENCOUNTER — Other Ambulatory Visit: Payer: Self-pay

## 2012-12-05 DIAGNOSIS — Z1231 Encounter for screening mammogram for malignant neoplasm of breast: Secondary | ICD-10-CM

## 2012-12-26 ENCOUNTER — Ambulatory Visit (INDEPENDENT_AMBULATORY_CARE_PROVIDER_SITE_OTHER): Payer: Medicare Other | Admitting: Family Medicine

## 2012-12-26 ENCOUNTER — Encounter: Payer: Self-pay | Admitting: Family Medicine

## 2012-12-26 VITALS — BP 136/90 | HR 108 | Temp 98.3°F | Wt 157.2 lb

## 2012-12-26 DIAGNOSIS — F418 Other specified anxiety disorders: Secondary | ICD-10-CM

## 2012-12-26 DIAGNOSIS — F341 Dysthymic disorder: Secondary | ICD-10-CM

## 2012-12-26 DIAGNOSIS — F319 Bipolar disorder, unspecified: Secondary | ICD-10-CM

## 2012-12-26 MED ORDER — SERTRALINE HCL 25 MG PO TABS: 25.0000 mg | ORAL_TABLET | Freq: Every day | ORAL | Status: DC

## 2012-12-26 MED ORDER — CHLORDIAZEPOXIDE HCL 5 MG PO CAPS: 5.0000 mg | ORAL_CAPSULE | Freq: Two times a day (BID) | ORAL | Status: DC | PRN

## 2012-12-26 NOTE — Assessment & Plan Note (Signed)
Worsened since decrease in librium. Discussed pros/cons of med including increased confusion and falls risk on this medicine. Pt feels she has significantly benefited from this medication in the past, will recommence at 5mg  bid prn - again advised to use sparingly.  #60 provided today. Given endorsed worsening anxiety, will also start zoloft at 25mg  daily, along with her imipramine 25mg  bid.  ?slow taper off imipramine if SSRI tolerated. Also discussed possible counseling for mood disorders. rtc 1 mo for f/u.  Pt agrees with plan.

## 2012-12-26 NOTE — Patient Instructions (Addendum)
Let's do trial of zoloft (sertraline) 25mg  daily. Continue imipramine 25mg  twice daily. Continue librium (chlordiazepoxide) once to twice daily as needed (there is a fall risk associated with this medicine). We could consider counseling if anxiety doesn't get better with above plan. Return to see me in 1 month for follow up, sooner if needed.

## 2012-12-26 NOTE — Assessment & Plan Note (Signed)
This dx is listed on problem list - will need to further assess for bipolar next visit.

## 2012-12-26 NOTE — Progress Notes (Signed)
  Subjective:    Patient ID: Brooke Beasley, female    DOB: October 12, 1930, 79 y.o.   MRN: [id number]  HPI CC: transfer of care from Dr. Milinda Antis.  H/o orthostatic hypotension presenting as syncope with resultant maxillary facture.  Has healed well from this.  H/o anxiety and depression - anhedonia endorsed.  Depressed mood endorsed.  Trouble sleeping.  Energy decreased.  Fair concentration.  Denies guilt.  No HI.  + passive SI but no plan.  Increased perspiration.  Excessive worrying.  Endorsing anxiety attacks as well.  This has worsened since she's decreased librium.  Has seen psychiatrist remotely in the past who started her on current regimen.  Has not seen counselor. 2/2 worsening mood over last 2 weeks, Restarted librium on her own - has been taking 1 daily.  GI issues with constipation - librax helps this as well.  Takes miralax and 3 stool softeners regularly.  Has been very stressed out recently - takes chlordiazepoxide and imipramine 25mg  bid. Had nervous breakdown after flu shot 1964. Had bad reaction to prozac in the past - didn't like how she felt.  Past Medical History  Diagnosis Date  . Osteoarthritis   . Anxiety   . Depression     nervous breakdonw in 1964-out of work for a year  . Hyperlipidemia   . Thyroid disease     Hypothyroidism  . Osteoporosis   . History of shingles     In past  . Intestinal bacterial overgrowth     In small colon  . Hypertension   . Degenerative disc disease     LS  . Heart murmur 5/13    mitral regurge - on echo   . Hypothyroidism   . GERD (gastroesophageal reflux disease)     Review of Systems Per HPI    Objective:   Physical Exam  Nursing note and vitals reviewed. Constitutional: She appears well-developed and well-nourished. No distress.  HENT:  Mouth/Throat: Oropharynx is clear and moist. No oropharyngeal exudate.  Eyes: Conjunctivae and EOM are normal. Pupils are equal, round, and reactive to light.  Cardiovascular: Normal  rate, regular rhythm and intact distal pulses.   Murmur (SEM best at RUSB) heard. bigeminy  Pulmonary/Chest: Effort normal and breath sounds normal. No respiratory distress. She has no wheezes. She has no rales.  Musculoskeletal: She exhibits no edema.  Psychiatric: She has a normal mood and affect. Her behavior is normal. Judgment and thought content normal.  Slightly anxious       Assessment & Plan:

## 2012-12-27 ENCOUNTER — Ambulatory Visit
Admission: RE | Admit: 2012-12-27 | Discharge: 2012-12-27 | Disposition: A | Discharge status: Home / Self Care | Payer: Medicare Other | Source: Ambulatory Visit

## 2012-12-27 DIAGNOSIS — Z1231 Encounter for screening mammogram for malignant neoplasm of breast: Secondary | ICD-10-CM

## 2012-12-28 ENCOUNTER — Encounter: Payer: Self-pay | Admitting: Family Medicine

## 2012-12-28 ENCOUNTER — Encounter: Payer: Self-pay | Admitting: *Deleted

## 2013-01-30 ENCOUNTER — Encounter: Payer: Self-pay | Admitting: Family Medicine

## 2013-01-30 ENCOUNTER — Ambulatory Visit (INDEPENDENT_AMBULATORY_CARE_PROVIDER_SITE_OTHER): Payer: Medicare Other | Admitting: Family Medicine

## 2013-01-30 ENCOUNTER — Encounter (INDEPENDENT_AMBULATORY_CARE_PROVIDER_SITE_OTHER): Payer: Medicare Other | Admitting: Family Medicine

## 2013-01-30 VITALS — BP 144/82 | HR 88 | Temp 98.1°F | Wt 157.0 lb

## 2013-01-30 DIAGNOSIS — F319 Bipolar disorder, unspecified: Secondary | ICD-10-CM

## 2013-01-30 DIAGNOSIS — F341 Dysthymic disorder: Secondary | ICD-10-CM

## 2013-01-30 DIAGNOSIS — F418 Other specified anxiety disorders: Secondary | ICD-10-CM

## 2013-01-30 MED ORDER — DOXEPIN HCL 3 MG PO TABS: 3.0000 mg | ORAL_TABLET | Freq: Every evening | ORAL | Status: DC | PRN

## 2013-01-30 NOTE — Patient Instructions (Addendum)
Let's change sertraline (zoloft) to morning. Let's decrease imipramine to 25mg  at bedtime. Try silenor 3mg  for sleep at night. Use librium as needed for anxiety. Return to see me in 1-2 months. I'm glad you're doing well.

## 2013-01-30 NOTE — Assessment & Plan Note (Addendum)
Overall improved on sertraline. Continue. For sleep maintenance insomnia - start silenor 3mg  nightly. Decrease imipramine to 25mg  nightly. Continue sertraline 25mg  daily. Discussed counselling - will defer for now. rtc 1-2 mo. Discussed use of librium prn.  PHQ9 = 16/27, not at all difficult to function GAD7 = 3/21 MDQ = 3.  + fmhx bipolar disorder.

## 2013-01-30 NOTE — Progress Notes (Signed)
  Subjective:    Patient ID: Brooke Beasley, female    DOB: 10-15-1930, 79 y.o.   MRN: [id number]  HPI CC: 1 mo f/u   See prior note for details.  Last visit we started zoloft 25mg  daily.  This has helped some "I can live with myself".  Continues imipramine 25mg  bid.  She decreased librium 5mg  to one at night time.   Trouble staying asleep.  Wakes up every 2 hours.  Pt unsure about dx bipolar I in chart.  Denies manic episodes.  No irresponsible spending.  Not able to stay awake for days.  Past Medical History  Diagnosis Date  . Osteoarthritis   . Anxiety   . Depression     nervous breakdonw in 1964-out of work for a year  . Hyperlipidemia   . Thyroid disease     Hypothyroidism  . Osteoporosis   . History of shingles     In past  . Intestinal bacterial overgrowth     In small colon  . Hypertension   . Degenerative disc disease     LS  . Heart murmur 5/13    mitral regurge - on echo   . Hypothyroidism   . GERD (gastroesophageal reflux disease)      Review of Systems Per HPI    Objective:   Physical Exam  Nursing note and vitals reviewed. Constitutional: She appears well-developed and well-nourished. No distress.  Psychiatric: She has a normal mood and affect.        Assessment & Plan:

## 2013-01-30 NOTE — Assessment & Plan Note (Addendum)
Doubt this dx.  MDQ today - 3.  Will remove from problem list.

## 2013-02-02 ENCOUNTER — Telehealth: Payer: Self-pay | Admitting: *Deleted

## 2013-02-02 NOTE — Progress Notes (Signed)
This encounter was created in error - please disregard.

## 2013-02-02 NOTE — Telephone Encounter (Signed)
Pt calls re: newly prescribed Doxepin 3 mg. She is concerned because 15 years ago Dr Jarold Motto prescribed Doxepin 50 mg for her. She says she only took med for one night because the next morning she woke up craving another one. She d/c it due to fear of possible addiction. She is afraid to take the 3 mg (despite lower strength) because of this and has not picked up med from pharmacy. She is also concerned about the cost, she was told it would be $30 a month by pharmacy.  She wants rx for a similar but different med.

## 2013-02-04 NOTE — Telephone Encounter (Signed)
This medicine should not cause addiction, and it's a good medicine to help sleep maintenance issues which is what her symptoms were. I don't recommend any other med for now - we can hold off on sleep medicine -  If she'd like we can try to increase sertraline dose to see if will help with sleep (as better control of mood issues may improve insomnia).

## 2013-02-05 NOTE — Telephone Encounter (Signed)
Spoke with patient. She went ahead and started the doxepin. She said she will keep a good check on things and call if she notices any problems. She will hold off on increasing the sertraline for now.

## 2013-02-14 ENCOUNTER — Other Ambulatory Visit: Payer: Self-pay | Admitting: *Deleted

## 2013-02-14 MED ORDER — SIMVASTATIN 20 MG PO TABS: 10.0000 mg | ORAL_TABLET | Freq: Every day | ORAL | Status: DC

## 2013-02-14 MED ORDER — LEVOTHYROXINE SODIUM 75 MCG PO TABS: 75.0000 ug | ORAL_TABLET | Freq: Every day | ORAL | Status: DC

## 2013-02-14 MED ORDER — LOSARTAN POTASSIUM 50 MG PO TABS: ORAL_TABLET | ORAL | Status: DC

## 2013-02-14 MED ORDER — HYDROCHLOROTHIAZIDE 25 MG PO TABS: 12.5000 mg | ORAL_TABLET | Freq: Every day | ORAL | Status: DC

## 2013-03-01 ENCOUNTER — Other Ambulatory Visit: Payer: Self-pay

## 2013-04-02 ENCOUNTER — Ambulatory Visit (INDEPENDENT_AMBULATORY_CARE_PROVIDER_SITE_OTHER): Payer: Medicare Other | Admitting: Family Medicine

## 2013-04-02 ENCOUNTER — Encounter: Payer: Self-pay | Admitting: Family Medicine

## 2013-04-02 VITALS — BP 156/92 | HR 72 | Temp 97.8°F | Wt 156.5 lb

## 2013-04-02 DIAGNOSIS — F458 Other somatoform disorders: Secondary | ICD-10-CM

## 2013-04-02 DIAGNOSIS — K219 Gastro-esophageal reflux disease without esophagitis: Secondary | ICD-10-CM | POA: Insufficient documentation

## 2013-04-02 DIAGNOSIS — F449 Dissociative and conversion disorder, unspecified: Secondary | ICD-10-CM

## 2013-04-02 DIAGNOSIS — R09A2 Foreign body sensation, throat: Secondary | ICD-10-CM

## 2013-04-02 DIAGNOSIS — F341 Dysthymic disorder: Secondary | ICD-10-CM

## 2013-04-02 DIAGNOSIS — F418 Other specified anxiety disorders: Secondary | ICD-10-CM

## 2013-04-02 DIAGNOSIS — I10 Essential (primary) hypertension: Secondary | ICD-10-CM

## 2013-04-02 MED ORDER — SERTRALINE HCL 50 MG PO TABS: 50.0000 mg | ORAL_TABLET | Freq: Every day | ORAL | Status: DC

## 2013-04-02 NOTE — Assessment & Plan Note (Signed)
elevated today in office.  Pt on hctz 12.5mg  daily and losartan 50mg  daily.  H/o orthostasis in past, prefer to err on bp slightly above goal.  No changes today.

## 2013-04-02 NOTE — Progress Notes (Signed)
Pre-visit discussion using our clinic review tool. No additional management support is needed unless otherwise documented below in the visit note.  

## 2013-04-02 NOTE — Patient Instructions (Addendum)
You do have a small ulcer on the lower gum.  Avoid dentures when you can, and may use over the counter oragel for numbing Let's increase sertraline to 50mg  daily (take two until you fill new dose). Ok to take librium as needed for severe anxiety. Return to see me in 3-4 months for follow up, sooner if needed. Good to see you today, call us with questions.

## 2013-04-02 NOTE — Assessment & Plan Note (Signed)
Stable but could do better- will increase sertraline to 50mg  daily in am.  Continue imipramine for now Continue libirum prn. Did not respond to silenor.

## 2013-04-02 NOTE — Progress Notes (Signed)
   Subjective:    Patient ID: Brooke Beasley, female    DOB: 1930-05-06, 79 y.o.   MRN: [id number]  HPI CC: 2 mo mood f/u  Brooke Beasley presents today as 2 month follow up for mood.  Last visit, we changed zoloft to am (25mg  daily) and decreased imipramine to 25mg  nightly. We also started silenor 3mg  nightly 2/2 endorsed sleep maintenance insomnia.  Thinks silenor caused hallucinations and possibly heart racing.  Didn't help sleep.  Stopped this medication. Has not taken librium in last 2 weeks.  Continues to note trouble with anxiety and excessive worrying.  Notes sensation of lump in throat.  Endorses heartburn/reflux.  + PNdrainage as well as rhinorrhea and congestion.  No fevers/chills, weight loss, dysphagia, early satiety.  Flu shot - declines, thinks flu shot previously caused nervous breakdown.  BP Readings from Last 3 Encounters:  04/02/13 156/92  01/30/13 144/82  12/26/12 136/90   Wt Readings from Last 3 Encounters:  04/02/13 156 lb 8 oz (70.988 kg)  01/30/13 157 lb (71.215 kg)  12/26/12 157 lb 4 oz (71.328 kg)    Past Medical History  Diagnosis Date  . HTN (hypertension)   . Anxiety   . Depression     nervous breakdonw in 1964-out of work for a year  . Hyperlipidemia   . Hypothyroidism   . Osteoporosis   . History of shingles   . Intestinal bacterial overgrowth     In small colon  . OA (osteoarthritis)   . Degenerative disc disease     LS  . Heart murmur 5/13    mitral regurge - on echo   . GERD (gastroesophageal reflux disease)      Review of Systems Per HPI    Objective:   Physical Exam  Nursing note and vitals reviewed. Constitutional: She appears well-developed and well-nourished. No distress.  Psychiatric: She has a normal mood and affect.       Assessment & Plan:

## 2013-04-02 NOTE — Assessment & Plan Note (Signed)
No red flags - anticipate heartburn related - will discuss ppi or antihistamine prn at next visit

## 2013-04-13 ENCOUNTER — Other Ambulatory Visit: Payer: Self-pay | Admitting: *Deleted

## 2013-04-13 MED ORDER — SERTRALINE HCL 50 MG PO TABS: 50.0000 mg | ORAL_TABLET | Freq: Every day | ORAL | Status: DC

## 2013-04-20 ENCOUNTER — Other Ambulatory Visit: Payer: Self-pay | Admitting: *Deleted

## 2013-04-20 MED ORDER — HYDROCHLOROTHIAZIDE 12.5 MG PO CAPS: 12.5000 mg | ORAL_CAPSULE | Freq: Every day | ORAL | Status: DC

## 2013-06-06 ENCOUNTER — Other Ambulatory Visit: Payer: Self-pay | Admitting: Family Medicine

## 2013-06-06 DIAGNOSIS — E039 Hypothyroidism, unspecified: Secondary | ICD-10-CM

## 2013-06-06 DIAGNOSIS — I10 Essential (primary) hypertension: Secondary | ICD-10-CM

## 2013-06-06 DIAGNOSIS — E785 Hyperlipidemia, unspecified: Secondary | ICD-10-CM

## 2013-06-06 DIAGNOSIS — R739 Hyperglycemia, unspecified: Secondary | ICD-10-CM

## 2013-06-07 ENCOUNTER — Other Ambulatory Visit (INDEPENDENT_AMBULATORY_CARE_PROVIDER_SITE_OTHER): Payer: Medicare Other

## 2013-06-07 DIAGNOSIS — R739 Hyperglycemia, unspecified: Secondary | ICD-10-CM

## 2013-06-07 DIAGNOSIS — E039 Hypothyroidism, unspecified: Secondary | ICD-10-CM

## 2013-06-07 DIAGNOSIS — R7309 Other abnormal glucose: Secondary | ICD-10-CM

## 2013-06-07 DIAGNOSIS — I10 Essential (primary) hypertension: Secondary | ICD-10-CM

## 2013-06-07 DIAGNOSIS — E785 Hyperlipidemia, unspecified: Secondary | ICD-10-CM

## 2013-06-07 LAB — LIPID PANEL
Cholesterol: 167 mg/dL (ref 0–200)
HDL: 76 mg/dL (ref >39.00)
LDL Cholesterol: 66 mg/dL (ref 0–99)
Total CHOL/HDL Ratio: 2
Triglycerides: 125 mg/dL (ref 0.0–149.0)
VLDL: 25 mg/dL (ref 0.0–40.0)

## 2013-06-07 LAB — BASIC METABOLIC PANEL
BUN: 10 mg/dL (ref 6–23)
CO2: 28 mEq/L (ref 19–32)
Calcium: 9.5 mg/dL (ref 8.4–10.5)
Chloride: 100 mEq/L (ref 96–112)
Creatinine, Ser: 1 mg/dL (ref 0.4–1.2)
GFR: 55.34 mL/min — ABNORMAL LOW (ref >60.00)
Glucose, Bld: 111 mg/dL — ABNORMAL HIGH (ref 70–99)
Potassium: 3.8 mEq/L (ref 3.5–5.1)
Sodium: 138 mEq/L (ref 135–145)

## 2013-06-07 LAB — TSH: TSH: 2.86 u[IU]/mL (ref 0.35–5.50)

## 2013-06-07 LAB — HEMOGLOBIN A1C: Hgb A1c MFr Bld: 6.1 % (ref 4.6–6.5)

## 2013-06-08 ENCOUNTER — Encounter: Payer: Self-pay | Admitting: Family Medicine

## 2013-06-08 ENCOUNTER — Ambulatory Visit: Payer: Medicare Other | Admitting: Family Medicine

## 2013-06-08 ENCOUNTER — Ambulatory Visit (INDEPENDENT_AMBULATORY_CARE_PROVIDER_SITE_OTHER): Payer: Medicare Other | Admitting: Family Medicine

## 2013-06-08 VITALS — BP 162/100 | HR 96 | Temp 97.3°F | Ht 62.0 in | Wt 156.0 lb

## 2013-06-08 DIAGNOSIS — I10 Essential (primary) hypertension: Secondary | ICD-10-CM

## 2013-06-08 DIAGNOSIS — E785 Hyperlipidemia, unspecified: Secondary | ICD-10-CM

## 2013-06-08 DIAGNOSIS — Z0000 Encounter for general adult medical examination without abnormal findings: Secondary | ICD-10-CM

## 2013-06-08 DIAGNOSIS — E039 Hypothyroidism, unspecified: Secondary | ICD-10-CM

## 2013-06-08 DIAGNOSIS — M810 Age-related osteoporosis without current pathological fracture: Secondary | ICD-10-CM

## 2013-06-08 DIAGNOSIS — F341 Dysthymic disorder: Secondary | ICD-10-CM

## 2013-06-08 DIAGNOSIS — F418 Other specified anxiety disorders: Secondary | ICD-10-CM

## 2013-06-08 DIAGNOSIS — Z23 Encounter for immunization: Secondary | ICD-10-CM

## 2013-06-08 MED ORDER — TRIAMTERENE-HCTZ 37.5-25 MG PO TABS: 0.5000 | ORAL_TABLET | Freq: Every day | ORAL | Status: DC

## 2013-06-08 MED ORDER — SIMVASTATIN 10 MG PO TABS: 10.0000 mg | ORAL_TABLET | Freq: Every day | ORAL | Status: DC

## 2013-06-08 MED ORDER — SERTRALINE HCL 100 MG PO TABS: 100.0000 mg | ORAL_TABLET | Freq: Every day | ORAL | Status: DC

## 2013-06-08 MED ORDER — TRIAMTERENE-HCTZ 37.5-25 MG PO TABS: 1.0000 | ORAL_TABLET | Freq: Every day | ORAL | Status: DC

## 2013-06-08 NOTE — Patient Instructions (Addendum)
Prevnar today. Let's increase zoloft to 100mg  daily - start with 1.5 tablets for 1 week then increase to 2 tablets until you run out.  I've sent new dose to your pharmacy Your blood pressure is too high - start combo pill maxzide (this is hydrochlorothiazide plus triamterene) 1/2 tab daily instead of hydrochlorothiazide.  Continue losartan. Good to see you today, call us with questions. Return in 1 month for follow up with med changes.

## 2013-06-08 NOTE — Assessment & Plan Note (Addendum)
Slowly increase sertraline to goal 100mg  daily - see pt instructions on slow taper.

## 2013-06-08 NOTE — Assessment & Plan Note (Signed)
Reviewed importance of meeting daily cal/vit D requirement.

## 2013-06-08 NOTE — Progress Notes (Signed)
BP 162/100  Pulse 96  Temp(Src) 97.3 F (36.3 C) (Oral)  Ht 5\' 2"  (1.575 m)  Wt 156 lb (70.761 kg)  BMI 28.53 kg/m2   CC: medicare wellness visit Subjective:    Patient ID: Brooke Beasley, female    DOB: 02-Aug-1930, 80 y.o.   MRN: [id number]  HPI: Brooke Beasley is a 80 y.o. female presenting on 06/08/2013 with Annual Exam   HTN - bp elevated today - attributes to anxiety.  Compliant with losartan 50mg  daily and hctz 12.5mg  daily. BP Readings from Last 3 Encounters:  06/08/13 162/100  04/02/13 156/92  01/30/13 144/82   Anxiety - on zoloft 50mg  daily  Trouble with hearing. Declines audiology eval today but may let me know if changes mind.  Some tinnitus. Passes vision screen.  H/o glaucoma sees eye doctor regularly. No falls in last year. + depression under treatment.  PHQ9 = 13 today.  Main concern is anxiety.  Preventative: Colonoscopy 2004 - ok, states had significant diverticulosis.  No recent blood in stool. Taking probiotics DEXA - 2011 osteoporosis. Pt does not take calcium but does take vit D.  Declines rpt DEXA. Well woman - last done about 10 yrs ago. Mammogram 12/2012 - WNL Flu shot - declines Pneumovax - 2004.  Would like prevnar today. Td 2010 zostavax 2012 Advanced directives - discussed.  HC proxy is daughters [name] and other.  Has living will at home.  Relevant past medical, surgical, family and social history reviewed and updated. Allergies and medications reviewed and updated. Current Outpatient Prescriptions on File Prior to Visit  Medication Sig  . Bimatoprost (LUMIGAN) 0.01 % SOLN Apply to eye at bedtime. Put one drop in both eyes at night.  . chlordiazePOXIDE (LIBRIUM) 5 MG capsule Take 1 capsule (5 mg total) by mouth 2 (two) times daily as needed for anxiety. For severe anxiety only  . fish oil-omega-3 fatty acids 1000 MG capsule Take 2 g by mouth 3 (three) times a week.  . Flaxseed, Linseed, (FLAX SEED OIL PO) Take by mouth 3 (three) times a  week.  Marland Kitchen imipramine (TOFRANIL) 25 MG tablet Take 25 mg by mouth at bedtime.  Marland Kitchen levothyroxine (SYNTHROID, LEVOTHROID) 75 MCG tablet Take 1 tablet (75 mcg total) by mouth daily.  Marland Kitchen losartan (COZAAR) 50 MG tablet Take 1 tab by mouth once daily  . polyethylene glycol powder (MIRALAX) powder Take by mouth. OTC as directed.   No current facility-administered medications on file prior to visit.    Review of Systems  Constitutional: Negative for fever, chills, activity change, appetite change, fatigue and unexpected weight change.  HENT: Negative for hearing loss.   Eyes: Negative for visual disturbance.  Respiratory: Negative for cough, chest tightness, shortness of breath and wheezing.   Cardiovascular: Negative for chest pain, palpitations and leg swelling.  Gastrointestinal: Negative for nausea, vomiting, abdominal pain, diarrhea, constipation, blood in stool and abdominal distention.  Genitourinary: Negative for hematuria and difficulty urinating.  Musculoskeletal: Negative for arthralgias, myalgias and neck pain.  Skin: Negative for rash.  Neurological: Negative for dizziness, seizures, syncope and headaches.  Hematological: Negative for adenopathy. Does not bruise/bleed easily.  Psychiatric/Behavioral: Negative for dysphoric mood. The patient is nervous/anxious.    Per HPI unless specifically indicated above    Objective:    BP 162/100  Pulse 96  Temp(Src) 97.3 F (36.3 C) (Oral)  Ht 5\' 2"  (1.575 m)  Wt 156 lb (70.761 kg)  BMI 28.53 kg/m2  Physical Exam  Nursing  note and vitals reviewed. Constitutional: She is oriented to person, place, and time. She appears well-developed and well-nourished. No distress.  HENT:  Head: Normocephalic and atraumatic.  Right Ear: Hearing, tympanic membrane, external ear and ear canal normal.  Left Ear: Hearing, tympanic membrane, external ear and ear canal normal.  Nose: Nose normal.  Mouth/Throat: Uvula is midline, oropharynx is clear and moist  and mucous membranes are normal. No oropharyngeal exudate, posterior oropharyngeal edema or posterior oropharyngeal erythema.  Eyes: Conjunctivae and EOM are normal. Pupils are equal, round, and reactive to light. No scleral icterus.  Neck: Normal range of motion. Neck supple. Carotid bruit is not present. No thyromegaly present.  Cardiovascular: Normal rate, regular rhythm, normal heart sounds and intact distal pulses.   No murmur heard. Pulses:      Radial pulses are 2+ on the right side, and 2+ on the left side.  Pulmonary/Chest: Effort normal and breath sounds normal. No respiratory distress. She has no wheezes. She has no rales. Right breast exhibits no inverted nipple, no mass, no nipple discharge, no skin change and no tenderness. Left breast exhibits no inverted nipple, no mass, no nipple discharge, no skin change and no tenderness.  Abdominal: Soft. Bowel sounds are normal. She exhibits no distension and no mass. There is no tenderness. There is no rebound and no guarding.  Musculoskeletal: Normal range of motion. She exhibits no edema.  Lymphadenopathy:       Head (right side): No submandibular, no tonsillar, no preauricular and no posterior auricular adenopathy present.       Head (left side): No submandibular, no tonsillar, no preauricular and no posterior auricular adenopathy present.    She has no cervical adenopathy.    She has no axillary adenopathy.       Right axillary: No lateral adenopathy present.       Left axillary: No lateral adenopathy present.      Right: No supraclavicular adenopathy present.       Left: No supraclavicular adenopathy present.  Neurological: She is alert and oriented to person, place, and time.  CN grossly intact, station and gait intact [id number] 3/3 recall  Skin: Skin is warm and dry. No rash noted.  Psychiatric: She has a normal mood and affect. Her behavior is normal. Judgment and thought content normal.   Results for orders placed in visit on  06/07/13  HEMOGLOBIN A1C      Result Value Ref Range   Hemoglobin A1C 6.1  4.6 - 6.5 %  BASIC METABOLIC PANEL      Result Value Ref Range   Sodium 138  135 - 145 mEq/L   Potassium 3.8  3.5 - 5.1 mEq/L   Chloride 100  96 - 112 mEq/L   CO2 28  19 - 32 mEq/L   Glucose, Bld 111 (*) 70 - 99 mg/dL   BUN 10  6 - 23 mg/dL   Creatinine, Ser 1.0  0.4 - 1.2 mg/dL   Calcium 9.5  8.4 - 10.5 mg/dL   GFR 55.34 (*) >60.00 mL/min  LIPID PANEL      Result Value Ref Range   Cholesterol 167  0 - 200 mg/dL   Triglycerides 125.0  0.0 - 149.0 mg/dL   HDL 76.00  >39.00 mg/dL   VLDL 25.0  0.0 - 40.0 mg/dL   LDL Cholesterol 66  0 - 99 mg/dL   Total CHOL/HDL Ratio 2    TSH      Result Value Ref Range  TSH 2.86  0.35 - 5.50 uIU/mL      Assessment & Plan:   Problem List Items Addressed This Visit   Anxiety associated with depression     Slowly increase sertraline to goal 100mg  daily - see pt instructions on slow taper.    HYPERLIPIDEMIA     Chronic, great control on 10mg  simvastatin. Continue.    Relevant Medications      simvastatin (ZOCOR) tablet      triamterene-hydrochlorothiazide (MAXZIDE-25) 37.5-25 MG per tablet   HYPERTENSION, BENIGN     Again elevated today.  With CKD noted today, goal BP <130/80, but she does have h/o orthostasis with syncope in past. Will start maxzide 1/2 tab daily and rtc 1 mo for f/u.    HYPOTHYROIDISM     Chronic, stable.    Medicare annual wellness visit, subsequent - Primary     I have personally reviewed the Medicare Annual Wellness questionnaire and have noted 1. The patient's medical and social history 2. Their use of alcohol, tobacco or illicit drugs 3. Their current medications and supplements 4. The patient's functional ability including ADL's, fall risks, home safety risks and hearing or visual impairment. 5. Diet and physical activity 6. Evidence for depression or mood disorders The patients weight, height, BMI have been recorded in the chart.   Hearing and vision has been addressed. I have made referrals, counseling and provided education to the patient based review of the above and I have provided the pt with a written personalized care plan for preventive services. See scanned questionairre. Advanced directives discussed: pt will ensure has updated living will.  Reviewed preventative protocols and updated unless pt declined. Breast exam today.  UTD mammogram. prevnar today.    Relevant Orders      Pneumococcal conjugate vaccine 13-valent (Completed)   OSTEOPOROSIS     Reviewed importance of meeting daily cal/vit D requirement.    Relevant Medications      Cholecalciferol (VITAMIN D PO)    Other Visit Diagnoses   Immunization due        Relevant Orders       Pneumococcal conjugate vaccine 13-valent (Completed)        Follow up plan: Return in about 1 month (around 07/06/2013), or as needed, for follow up med changes.

## 2013-06-08 NOTE — Assessment & Plan Note (Signed)
Chronic, great control on 10mg  simvastatin. Continue.

## 2013-06-08 NOTE — Assessment & Plan Note (Signed)
Chronic, stable 

## 2013-06-08 NOTE — Assessment & Plan Note (Signed)
I have personally reviewed the Medicare Annual Wellness questionnaire and have noted 1. The patient's medical and social history 2. Their use of alcohol, tobacco or illicit drugs 3. Their current medications and supplements 4. The patient's functional ability including ADL's, fall risks, home safety risks and hearing or visual impairment. 5. Diet and physical activity 6. Evidence for depression or mood disorders The patients weight, height, BMI have been recorded in the chart.  Hearing and vision has been addressed. I have made referrals, counseling and provided education to the patient based review of the above and I have provided the pt with a written personalized care plan for preventive services. See scanned questionairre. Advanced directives discussed: pt will ensure has updated living will.  Reviewed preventative protocols and updated unless pt declined. Breast exam today.  UTD mammogram. prevnar today.

## 2013-06-08 NOTE — Assessment & Plan Note (Signed)
Reviewed A1c today.  Pt aware of dx.

## 2013-06-08 NOTE — Progress Notes (Signed)
Pre-visit discussion using our clinic review tool. No additional management support is needed unless otherwise documented below in the visit note.  

## 2013-06-08 NOTE — Assessment & Plan Note (Signed)
Again elevated today.  With CKD noted today, goal BP <130/80, but she does have h/o orthostasis with syncope in past. Will start maxzide 1/2 tab daily and rtc 1 mo for f/u.

## 2013-06-13 ENCOUNTER — Telehealth: Payer: Self-pay | Admitting: *Deleted

## 2013-06-13 NOTE — Telephone Encounter (Signed)
Primemail requested clarification on Maxide. Patient is allergic to Sulfa. Please advise.

## 2013-06-13 NOTE — Telephone Encounter (Signed)
Should be ok to try maxzide - watch for rash and if that happens to stop.   She has been on HCTZ in past and this is med that would be similar to sulfa.

## 2013-06-14 NOTE — Telephone Encounter (Signed)
Primemail notified.

## 2013-07-03 ENCOUNTER — Ambulatory Visit: Payer: Medicare Other | Admitting: Family Medicine

## 2013-07-24 ENCOUNTER — Encounter: Payer: Self-pay | Admitting: Family Medicine

## 2013-07-24 ENCOUNTER — Telehealth: Payer: Self-pay | Admitting: Family Medicine

## 2013-07-24 ENCOUNTER — Ambulatory Visit (INDEPENDENT_AMBULATORY_CARE_PROVIDER_SITE_OTHER): Payer: Medicare Other | Admitting: Family Medicine

## 2013-07-24 VITALS — BP 160/96 | HR 86 | Temp 97.7°F | Wt 154.1 lb

## 2013-07-24 DIAGNOSIS — R011 Cardiac murmur, unspecified: Secondary | ICD-10-CM

## 2013-07-24 DIAGNOSIS — F341 Dysthymic disorder: Secondary | ICD-10-CM

## 2013-07-24 DIAGNOSIS — I10 Essential (primary) hypertension: Secondary | ICD-10-CM

## 2013-07-24 DIAGNOSIS — F418 Other specified anxiety disorders: Secondary | ICD-10-CM

## 2013-07-24 MED ORDER — TRIAMTERENE-HCTZ 37.5-25 MG PO TABS
1.0000 | ORAL_TABLET | Freq: Every day | ORAL | Status: DC
Start: 2013-07-24 — End: 2013-11-21

## 2013-07-24 NOTE — Assessment & Plan Note (Signed)
Stable on sertraline 100mg daily. 

## 2013-07-24 NOTE — Telephone Encounter (Signed)
Would suggest trial of melatonin OTC at night time and update [place] if this doesn't help after 1 wk trial.

## 2013-07-24 NOTE — Assessment & Plan Note (Signed)
Persistently elevated blood pressures - will increase maxzide to 1 whole tablet daily. Discussed goal bp <130/80 given some renal insufficiency history. Home bp cuff seems to be accurate. Continue monitoring at home and notify me if persistently >140/90. F/u in 3 mo

## 2013-07-24 NOTE — Telephone Encounter (Signed)
Patient notified as instructed by telephone. 

## 2013-07-24 NOTE — Assessment & Plan Note (Signed)
Known mitral regurg although exam more consistnet with AS Continue to monitor.

## 2013-07-24 NOTE — Patient Instructions (Signed)
Your blood pressure is staying elevated - let's increase maxzide (triamterene hydrochlorothiazide) to 1 whole pill daily. Keep an eye on blood pressure at home and let me know if consistently >140/90 Your goal blood pressure is <130/80. Good to see you today, call us with questions.

## 2013-07-24 NOTE — Progress Notes (Signed)
Pre visit review using our clinic review tool, if applicable. No additional management support is needed unless otherwise documented below in the visit note. 

## 2013-07-24 NOTE — Telephone Encounter (Signed)
On checking out the patient [phone]) said she needed a follow up for high blood pressure.  Then she mentioned she forgot to tell you she was having trouble sleeping and wondered if that was the cause of her high blood pressure.  She would like to have something to help her sleep. She uses the [facility] on [address].

## 2013-07-24 NOTE — Progress Notes (Signed)
BP 160/96  Pulse 86  Temp(Src) 97.7 F (36.5 C) (Oral)  Wt 154 lb 1.9 oz (69.908 kg)   CC: 1 mo f/u   Subjective:    Patient ID: Brooke Beasley, female    DOB: 1930-07-08, 80 y.o.   MRN: [id number]  HPI: Brooke Beasley is a 80 y.o. female presenting on 07/24/2013 for Follow-up   Brooke Beasley presents today as 1 mo f/u of med changes.  See prior note for details.  Briefly, for HTN, maxzide 1/2 tablet daily was started in place of her HCTZ.  Losartan 50mg  daily was continued.  Noted improvement in blood pressure today.  Denies orthostasis in h/o same with syncope.  Endorses home readings 150-162/89.  No HA, vision changes, CP/tightness, leg swelling.  Denies dizzy episodes.  Some dyspnea with exertion but not out of normal for her.  Brings blood pressure cuff from home to compare to our readings.  Home bp cuff reading today: 172/90, my manual reading today:  160/96 BP Readings from Last 3 Encounters:  07/24/13 160/96  06/08/13 162/100  04/02/13 156/92    Also, sertraline was increased to 100mg  daily.  Feels this has helped significantly.  Wt Readings from Last 3 Encounters:  07/24/13 154 lb 1.9 oz (69.908 kg)  06/08/13 156 lb (70.761 kg)  04/02/13 156 lb 8 oz (70.988 kg)   Body mass index is 28.18 kg/(m^2).  Relevant past medical, surgical, family and social history reviewed and updated as indicated.  Allergies and medications reviewed and updated. Current Outpatient Prescriptions on File Prior to Visit  Medication Sig  . Bimatoprost (LUMIGAN) 0.01 % SOLN Apply to eye at bedtime. Put one drop in both eyes at night.  Marland Kitchen BIOTIN PO Take by mouth daily.  . chlordiazePOXIDE (LIBRIUM) 5 MG capsule Take 1 capsule (5 mg total) by mouth 2 (two) times daily as needed for anxiety. For severe anxiety only  . Cholecalciferol (VITAMIN D PO) Take by mouth daily.  . fish oil-omega-3 fatty acids 1000 MG capsule Take 2 g by mouth 3 (three) times a week.  . Flaxseed, Linseed, (FLAX SEED OIL PO)  Take by mouth 3 (three) times a week.  Marland Kitchen imipramine (TOFRANIL) 25 MG tablet Take 25 mg by mouth at bedtime.  Marland Kitchen levothyroxine (SYNTHROID, LEVOTHROID) 75 MCG tablet Take 1 tablet (75 mcg total) by mouth daily.  Marland Kitchen losartan (COZAAR) 50 MG tablet Take 1 tab by mouth once daily  . MAGNESIUM CARBONATE PO Take by mouth daily.  . polyethylene glycol powder (MIRALAX) powder Take by mouth. OTC as directed.  . Probiotic Product (PROBIOTIC DAILY PO) Take 4 capsules by mouth daily.  . sertraline (ZOLOFT) 100 MG tablet Take 1 tablet (100 mg total) by mouth daily.  . simvastatin (ZOCOR) 10 MG tablet Take 1 tablet (10 mg total) by mouth daily.   No current facility-administered medications on file prior to visit.    Review of Systems Per HPI unless specifically indicated above    Objective:    BP 160/96  Pulse 86  Temp(Src) 97.7 F (36.5 C) (Oral)  Wt 154 lb 1.9 oz (69.908 kg)  Physical Exam  Nursing note and vitals reviewed. Constitutional: Brooke Beasley appears well-developed and well-nourished. No distress.  HENT:  Mouth/Throat: Oropharynx is clear and moist. No oropharyngeal exudate.  Neck: Normal range of motion. Neck supple. Carotid bruit is not present. No thyromegaly present.  Cardiovascular: Normal rate, regular rhythm and intact distal pulses.   Murmur (4/6 SEM best at RUSB)  heard. Pulmonary/Chest: Effort normal and breath sounds normal. No respiratory distress. Brooke Beasley has no wheezes. Brooke Beasley has no rales.  Musculoskeletal: Brooke Beasley exhibits no edema.   Results for orders placed in visit on 06/07/13  HEMOGLOBIN A1C      Result Value Ref Range   Hemoglobin A1C 6.1  4.6 - 6.5 %  BASIC METABOLIC PANEL      Result Value Ref Range   Sodium 138  135 - 145 mEq/L   Potassium 3.8  3.5 - 5.1 mEq/L   Chloride 100  96 - 112 mEq/L   CO2 28  19 - 32 mEq/L   Glucose, Bld 111 (*) 70 - 99 mg/dL   BUN 10  6 - 23 mg/dL   Creatinine, Ser 1.0  0.4 - 1.2 mg/dL   Calcium 9.5  8.4 - 10.5 mg/dL   GFR 55.34 (*) >60.00  mL/min  LIPID PANEL      Result Value Ref Range   Cholesterol 167  0 - 200 mg/dL   Triglycerides 125.0  0.0 - 149.0 mg/dL   HDL 76.00  >39.00 mg/dL   VLDL 25.0  0.0 - 40.0 mg/dL   LDL Cholesterol 66  0 - 99 mg/dL   Total CHOL/HDL Ratio 2    TSH      Result Value Ref Range   TSH 2.86  0.35 - 5.50 uIU/mL      Assessment & Plan:   Problem List Items Addressed This Visit   HYPERTENSION, BENIGN - Primary     Persistently elevated blood pressures - will increase maxzide to 1 whole tablet daily. Discussed goal bp <130/80 given some renal insufficiency history. Home bp cuff seems to be accurate. Continue monitoring at home and notify me if persistently >140/90. F/u in 3 mo    Relevant Medications      triamterene-hydrochlorothiazide (MAXZIDE-25) 37.5-25 MG per tablet   Other Relevant Orders      Basic metabolic panel   Heart murmur     Known mitral regurg although exam more consistnet with AS Continue to monitor.    Anxiety associated with depression     Stable on sertraline 100mg  daily.        Follow up plan: Return in about 3 months (around 10/23/2013), or as needed, for follow up.

## 2013-10-23 ENCOUNTER — Encounter: Payer: Self-pay | Admitting: Family Medicine

## 2013-10-23 ENCOUNTER — Ambulatory Visit (INDEPENDENT_AMBULATORY_CARE_PROVIDER_SITE_OTHER): Payer: Medicare Other | Admitting: Family Medicine

## 2013-10-23 VITALS — BP 124/72 | HR 100 | Temp 97.9°F | Wt 153.8 lb

## 2013-10-23 DIAGNOSIS — F418 Other specified anxiety disorders: Secondary | ICD-10-CM

## 2013-10-23 DIAGNOSIS — F341 Dysthymic disorder: Secondary | ICD-10-CM

## 2013-10-23 DIAGNOSIS — G4700 Insomnia, unspecified: Secondary | ICD-10-CM

## 2013-10-23 DIAGNOSIS — I10 Essential (primary) hypertension: Secondary | ICD-10-CM

## 2013-10-23 LAB — BASIC METABOLIC PANEL
BUN: 22 mg/dL (ref 6–23)
CALCIUM: 9.6 mg/dL (ref 8.4–10.5)
CO2: 31 mEq/L (ref 19–32)
Chloride: 95 mEq/L — ABNORMAL LOW (ref 96–112)
Creatinine, Ser: 1.4 mg/dL — ABNORMAL HIGH (ref 0.4–1.2)
GFR: 39.34 mL/min — ABNORMAL LOW (ref >60.00)
Glucose, Bld: 89 mg/dL (ref 70–99)
Potassium: 3.6 mEq/L (ref 3.5–5.1)
Sodium: 134 mEq/L — ABNORMAL LOW (ref 135–145)

## 2013-10-23 MED ORDER — TRAZODONE HCL 50 MG PO TABS: 25.0000 mg | ORAL_TABLET | Freq: Every evening | ORAL | Status: DC | PRN

## 2013-10-23 NOTE — Patient Instructions (Signed)
Let's stop imipramine and start trazodone 1/2 to 1 tablet at bedtime for sleep. Continue sertraline. Watch mood with stopping imipramine. Blood work today. Return in 4-5 months for follow up, sooner if needed.

## 2013-10-23 NOTE — Progress Notes (Signed)
Pre visit review using our clinic review tool, if applicable. No additional management support is needed unless otherwise documented below in the visit note. 

## 2013-10-23 NOTE — Progress Notes (Signed)
BP 124/72  Pulse 100  Temp(Src) 97.9 F (36.6 C) (Oral)  Wt 153 lb 12 oz (69.741 kg)   CC: 3 mo f/u visit  Subjective:    Patient ID: Brooke Beasley, female    DOB: [birth year], 80 y.o.   MRN: [id number]  HPI: MI BALLA is a 80 y.o. female presenting on 10/23/2013 for Follow-up   Significant stress with husband who I care for with dementia. Good and bad days.   HTN - compliant with maxzide 1 tablet daily and losartan 50mg  daily. Denies orthostasis in h/o same with syncope. Has not been monitoring bp at home. No HA, vision changes, CP/tightness, leg swelling. Some dyspnea with exertion but not out of normal for her.   Insomnia - noticed trouble for last few years. Melatonin didn't help. Feels drained during the day. Sleep initiation and maintenance insomnia. Does have bedtime routine.  Mood - stable on sertraline 100mg  daily along with imipramine 25mg  nightly.  Relevant past medical, surgical, family and social history reviewed and updated as indicated.  Allergies and medications reviewed and updated. Current Outpatient Prescriptions on File Prior to Visit  Medication Sig  . Bimatoprost (LUMIGAN) 0.01 % SOLN Apply to eye at bedtime. Put one drop in both eyes at night.  Marland Kitchen BIOTIN PO Take by mouth daily.  . chlordiazePOXIDE (LIBRIUM) 5 MG capsule Take 1 capsule (5 mg total) by mouth 2 (two) times daily as needed for anxiety. For severe anxiety only  . Cholecalciferol (VITAMIN D PO) Take by mouth daily.  . fish oil-omega-3 fatty acids 1000 MG capsule Take 2 g by mouth 3 (three) times a week.  . Flaxseed, Linseed, (FLAX SEED OIL PO) Take by mouth 3 (three) times a week.  . levothyroxine (SYNTHROID, LEVOTHROID) 75 MCG tablet Take 1 tablet (75 mcg total) by mouth daily.  Marland Kitchen losartan (COZAAR) 50 MG tablet Take 1 tab by mouth once daily  . MAGNESIUM CARBONATE PO Take by mouth daily.  . polyethylene glycol powder (MIRALAX) powder Take by mouth. OTC as directed.  . Probiotic Product  (PROBIOTIC DAILY PO) Take 4 capsules by mouth daily.  . sertraline (ZOLOFT) 100 MG tablet Take 1 tablet (100 mg total) by mouth daily.  . simvastatin (ZOCOR) 10 MG tablet Take 1 tablet (10 mg total) by mouth daily.  Marland Kitchen triamterene-hydrochlorothiazide (MAXZIDE-25) 37.5-25 MG per tablet Take 1 tablet by mouth daily.   No current facility-administered medications on file prior to visit.    Review of Systems Per HPI unless specifically indicated above    Objective:    BP 124/72  Pulse 100  Temp(Src) 97.9 F (36.6 C) (Oral)  Wt 153 lb 12 oz (69.741 kg)  Physical Exam  Nursing note and vitals reviewed. Constitutional: She appears well-developed and well-nourished. No distress.  HENT:  Mouth/Throat: Oropharynx is clear and moist. No oropharyngeal exudate.  Cardiovascular: Normal rate, regular rhythm, normal heart sounds and intact distal pulses.   No murmur heard. Pulmonary/Chest: Effort normal and breath sounds normal. No respiratory distress. She has no wheezes. She has no rales.  Musculoskeletal: She exhibits no edema.  Skin: Skin is warm and dry. No rash noted.  Psychiatric: She has a normal mood and affect.       Assessment & Plan:   Problem List Items Addressed This Visit   Insomnia     Stop imipramine. Trial of trazodone 1/2 to 1 tab of 50mg  nightly for sleep.    HYPERTENSION, BENIGN - Primary  Stable on current regimen. Continue. Check Cr and K today. H/o renal insuff. Lab Results  Component Value Date   CREATININE 1.0 06/07/2013      Relevant Orders      Basic metabolic panel   Anxiety associated with depression     Continue sertraline100mg . Stop imipramine and start trazodone nightly for sleep.        Follow up plan: Return in about 4 months (around 02/22/2014), or if symptoms worsen or fail to improve, for follow up visit.

## 2013-10-23 NOTE — Assessment & Plan Note (Signed)
Stable on current regimen. Continue. Check Cr and K today. H/o renal insuff. Lab Results  Component Value Date   CREATININE 1.0 06/07/2013

## 2013-10-23 NOTE — Assessment & Plan Note (Signed)
Stop imipramine. Trial of trazodone 1/2 to 1 tab of 50mg  nightly for sleep.

## 2013-10-23 NOTE — Assessment & Plan Note (Signed)
Continue sertraline100mg . Stop imipramine and start trazodone nightly for sleep.

## 2013-10-24 ENCOUNTER — Encounter: Payer: Self-pay | Admitting: *Deleted

## 2013-11-21 ENCOUNTER — Ambulatory Visit (INDEPENDENT_AMBULATORY_CARE_PROVIDER_SITE_OTHER): Payer: Medicare Other | Admitting: Family Medicine

## 2013-11-21 ENCOUNTER — Ambulatory Visit (INDEPENDENT_AMBULATORY_CARE_PROVIDER_SITE_OTHER)
Admission: RE | Admit: 2013-11-21 | Discharge: 2013-11-21 | Disposition: A | Discharge status: Home / Self Care | Payer: Medicare Other | Source: Ambulatory Visit | Attending: Family Medicine | Admitting: Family Medicine

## 2013-11-21 ENCOUNTER — Ambulatory Visit
Admission: RE | Admit: 2013-11-21 | Discharge: 2013-11-21 | Disposition: A | Discharge status: Home / Self Care | Payer: Medicare Other | Source: Ambulatory Visit | Attending: Family Medicine | Admitting: Family Medicine

## 2013-11-21 ENCOUNTER — Encounter: Payer: Self-pay | Admitting: Family Medicine

## 2013-11-21 ENCOUNTER — Other Ambulatory Visit: Payer: Self-pay | Admitting: *Deleted

## 2013-11-21 VITALS — BP 134/82 | HR 88 | Temp 98.0°F | Wt 156.2 lb

## 2013-11-21 DIAGNOSIS — M19042 Primary osteoarthritis, left hand: Principal | ICD-10-CM

## 2013-11-21 DIAGNOSIS — M19041 Primary osteoarthritis, right hand: Secondary | ICD-10-CM | POA: Insufficient documentation

## 2013-11-21 DIAGNOSIS — M19049 Primary osteoarthritis, unspecified hand: Secondary | ICD-10-CM

## 2013-11-21 DIAGNOSIS — R55 Syncope and collapse: Secondary | ICD-10-CM

## 2013-11-21 LAB — CBC WITH DIFFERENTIAL/PLATELET
BASOS PCT: 0.5 % (ref 0.0–3.0)
Basophils Absolute: 0 10*3/uL (ref 0.0–0.1)
EOS ABS: 0.5 10*3/uL (ref 0.0–0.7)
EOS PCT: 6.2 % — AB (ref 0.0–5.0)
HCT: 37 % (ref 36.0–46.0)
Hemoglobin: 12.4 g/dL (ref 12.0–15.0)
LYMPHS PCT: 21.3 % (ref 12.0–46.0)
Lymphs Abs: 1.8 10*3/uL (ref 0.7–4.0)
MCHC: 33.6 g/dL (ref 30.0–36.0)
MCV: 92 fl (ref 78.0–100.0)
MONOS PCT: 12.4 % — AB (ref 3.0–12.0)
Monocytes Absolute: 1 10*3/uL (ref 0.1–1.0)
Neutro Abs: 4.9 10*3/uL (ref 1.4–7.7)
Neutrophils Relative %: 59.6 % (ref 43.0–77.0)
Platelets: 250 10*3/uL (ref 150.0–400.0)
RBC: 4.02 Mil/uL (ref 3.87–5.11)
RDW: 13.7 % (ref 11.5–15.5)
WBC: 8.2 10*3/uL (ref 4.0–10.5)

## 2013-11-21 LAB — RHEUMATOID FACTOR

## 2013-11-21 LAB — COMPREHENSIVE METABOLIC PANEL
ALT: 32 U/L (ref 0–35)
AST: 34 U/L (ref 0–37)
Albumin: 3.8 g/dL (ref 3.5–5.2)
Alkaline Phosphatase: 101 U/L (ref 39–117)
BUN: 26 mg/dL — ABNORMAL HIGH (ref 6–23)
CALCIUM: 9.4 mg/dL (ref 8.4–10.5)
CHLORIDE: 98 meq/L (ref 96–112)
CO2: 28 mEq/L (ref 19–32)
Creatinine, Ser: 1.5 mg/dL — ABNORMAL HIGH (ref 0.4–1.2)
GFR: 36.26 mL/min — ABNORMAL LOW (ref >60.00)
Glucose, Bld: 88 mg/dL (ref 70–99)
Potassium: 3.7 mEq/L (ref 3.5–5.1)
Sodium: 133 mEq/L — ABNORMAL LOW (ref 135–145)
Total Bilirubin: 0.6 mg/dL (ref 0.2–1.2)
Total Protein: 6.7 g/dL (ref 6.0–8.3)

## 2013-11-21 LAB — URIC ACID: Uric Acid, Serum: 4.8 mg/dL (ref 2.4–7.0)

## 2013-11-21 LAB — HIGH SENSITIVITY CRP: CRP HIGH SENSITIVITY: 7.06 mg/L — AB (ref 0.000–5.000)

## 2013-11-21 LAB — TSH: TSH: 2.21 u[IU]/mL (ref 0.35–4.50)

## 2013-11-21 LAB — SEDIMENTATION RATE: Sed Rate: 22 mm/hr (ref 0–22)

## 2013-11-21 MED ORDER — TRAMADOL HCL 50 MG PO TABS: 50.0000 mg | ORAL_TABLET | Freq: Two times a day (BID) | ORAL | Status: DC | PRN

## 2013-11-21 MED ORDER — TRAZODONE HCL 50 MG PO TABS: 50.0000 mg | ORAL_TABLET | Freq: Every evening | ORAL | Status: DC | PRN

## 2013-11-21 MED ORDER — TRIAMTERENE-HCTZ 37.5-25 MG PO TABS: 1.0000 | ORAL_TABLET | Freq: Every day | ORAL | Status: DC

## 2013-11-21 NOTE — Assessment & Plan Note (Signed)
Noted deformity of bilateral IPs. Check xray to eval for erosions, check labwork today (uric acid, ANA, ESR/CRP, RF, CCP) to eval for inflammatory arthritis, consider referral to rheum. rec start osteo biflex, rec start tylenol 109m bid scheduled, prescribed tramadol prn breakthrough pain with sedation precautions (rec try 1/2 tab first).

## 2013-11-21 NOTE — Progress Notes (Signed)
Pre visit review using our clinic review tool, if applicable. No additional management support is needed unless otherwise documented below in the visit note. 

## 2013-11-21 NOTE — Patient Instructions (Signed)
Let's check blood work today - if abnormal we may refer you to rheumatologist. xrays of hands today. Start glucosamine over the counter supplement (like in osteo bi flex) for joints. Start tylenol 1000mg  twice daily scheduled for discomfort. May use tramadol for breakthrough pain.

## 2013-11-21 NOTE — Assessment & Plan Note (Signed)
H/o this in past s/p normal workup. Previously thought orthostatic, now anticipate pain contributing.

## 2013-11-21 NOTE — Progress Notes (Signed)
BP 134/82  Pulse 88  Temp(Src) 98 F (36.7 C) (Oral)  Wt 156 lb 4 oz (70.875 kg)   CC: cervical neck pain with radiation down arm  Subjective:    Patient ID: Brooke Beasley, female    DOB: 1930-06-15, 80 y.o.   MRN: [id number]  HPI: Brooke Beasley is a 80 y.o. female presenting on 11/21/2013 for Neck Pain and Spasms   Presents alone today.  Severe R shoulder pain that radiates down to hand that has led to passing out in past. Last happened 1 week ago. When awoke was sitting on kitchen floor. Very brief loss of consciousness, no post ictal or bowel/bladder incontinence. Prior to passing out feels lightheaded, clammy.   Neck pain described as bilateral neck pain with radiation down R arm. Denies numbness or weakness of right arm. Denies inciting trauma/injury. Took aleve last night which did help.  Ongoing trouble with hand pain/arthritis. + swelling of finger joints ongoing for last several years. Left middle finger at PIP marked swelling and deformity present for years.  H/o lumbar DDD from OA. H/o syncopal episode 2013 where she hit face and had several fractures. Denies recent fevers/chills, denies new rashes or abd pain, nausea. No shoulder, elbow, wrist, or hip/knee/ankle pain.  Past Medical History  Diagnosis Date  . HTN (hypertension)   . Anxiety   . Depression     nervous breakdonw in 1964-out of work for a year  . Hyperlipidemia   . Hypothyroidism   . Osteoporosis     dexa 2011  . History of shingles   . Intestinal bacterial overgrowth     In small colon  . OA (osteoarthritis)   . Degenerative disc disease     LS  . Heart murmur 5/13    mitral regurge - on echo   . GERD (gastroesophageal reflux disease)   . Glaucoma      Relevant past medical, surgical, family and social history reviewed and updated as indicated.  Allergies and medications reviewed and updated. Current Outpatient Prescriptions on File Prior to Visit  Medication Sig  . Bimatoprost  (LUMIGAN) 0.01 % SOLN Apply to eye at bedtime. Put one drop in both eyes at night.  . levothyroxine (SYNTHROID, LEVOTHROID) 75 MCG tablet Take 1 tablet (75 mcg total) by mouth daily.  Marland Kitchen losartan (COZAAR) 50 MG tablet Take 1 tab by mouth once daily  . polyethylene glycol powder (MIRALAX) powder Take by mouth. OTC as directed.  . Probiotic Product (PROBIOTIC DAILY PO) Take 4 capsules by mouth daily.  . sertraline (ZOLOFT) 100 MG tablet Take 1 tablet (100 mg total) by mouth daily.  . simvastatin (ZOCOR) 10 MG tablet Take 1 tablet (10 mg total) by mouth daily.  Marland Kitchen triamterene-hydrochlorothiazide (MAXZIDE-25) 37.5-25 MG per tablet Take 1 tablet by mouth daily.  Marland Kitchen BIOTIN PO Take by mouth daily.  . chlordiazePOXIDE (LIBRIUM) 5 MG capsule Take 1 capsule (5 mg total) by mouth 2 (two) times daily as needed for anxiety. For severe anxiety only  . Cholecalciferol (VITAMIN D PO) Take by mouth daily.  . fish oil-omega-3 fatty acids 1000 MG capsule Take 2 g by mouth 3 (three) times a week.  . Flaxseed, Linseed, (FLAX SEED OIL PO) Take by mouth 3 (three) times a week.  Marland Kitchen MAGNESIUM CARBONATE PO Take by mouth daily.   No current facility-administered medications on file prior to visit.    Review of Systems Per HPI unless specifically indicated above    Objective:  BP 134/82  Pulse 88  Temp(Src) 98 F (36.7 C) (Oral)  Wt 156 lb 4 oz (70.875 kg)  Physical Exam  Nursing note and vitals reviewed. Constitutional: She appears well-developed and well-nourished. No distress.  HENT:  Mouth/Throat: Oropharynx is clear and moist. No oropharyngeal exudate.  Cardiovascular: Normal rate, regular rhythm and intact distal pulses.   Murmur (3/6 SEM) heard. Pulmonary/Chest: Effort normal and breath sounds normal. No respiratory distress. She has no wheezes. She has no rales.  Musculoskeletal: She exhibits no edema.  No midline spine tenderness, FROM at cervical spine, neg spurling test. Evident swelling with  nodules at PIP/DIP joints bilateral hands - maximal soft tissue swelling at L 3rd MIP. Some erythema of L digits noted, tender to direct pressure. FROM at shoulders, elbows, wrists.  Skin: Skin is warm and dry. No rash noted.   Results for orders placed in visit on 55/37/48  BASIC METABOLIC PANEL      Result Value Ref Range   Sodium 134 (*) 135 - 145 mEq/L   Potassium 3.6  3.5 - 5.1 mEq/L   Chloride 95 (*) 96 - 112 mEq/L   CO2 31  19 - 32 mEq/L   Glucose, Bld 89  70 - 99 mg/dL   BUN 22  6 - 23 mg/dL   Creatinine, Ser 1.4 (*) 0.4 - 1.2 mg/dL   Calcium 9.6  8.4 - 10.5 mg/dL   GFR 39.34 (*) >60.00 mL/min      Assessment & Plan:   Problem List Items Addressed This Visit   Syncope     H/o this in past s/p normal workup. Previously thought orthostatic, now anticipate pain contributing.    Relevant Orders      TSH      CBC with Differential      Comprehensive metabolic panel   Primary osteoarthritis of both hands - Primary     Noted deformity of bilateral IPs. Check xray to eval for erosions, check labwork today (uric acid, ANA, ESR/CRP, RF, CCP) to eval for inflammatory arthritis, consider referral to rheum. rec start osteo biflex, rec start tylenol 1047m bid scheduled, prescribed tramadol prn breakthrough pain with sedation precautions (rec try 1/2 tab first).    Relevant Medications      traMADol (ULTRAM) tablet 50 mg   Other Relevant Orders      TSH      Sedimentation rate      High sensitivity CRP      ANA      Rheumatoid factor      DG Hand Complete Right      DG Hand Complete Left      Uric acid       Follow up plan: Return in about 2 months (around 01/22/2014), or if symptoms worsen or fail to improve, for follow up visit.

## 2013-11-22 LAB — ANA: Anti Nuclear Antibody(ANA): NEGATIVE

## 2013-11-23 ENCOUNTER — Other Ambulatory Visit: Payer: Self-pay | Admitting: *Deleted

## 2013-11-23 MED ORDER — TRAZODONE HCL 50 MG PO TABS
50.0000 mg | ORAL_TABLET | Freq: Every evening | ORAL | Status: DC | PRN
Start: 2013-11-23 — End: 2014-01-07

## 2013-11-23 NOTE — Telephone Encounter (Signed)
Needed trazodone sent to mail order.

## 2013-12-08 ENCOUNTER — Other Ambulatory Visit: Payer: Self-pay | Admitting: Family Medicine

## 2013-12-08 DIAGNOSIS — N289 Disorder of kidney and ureter, unspecified: Secondary | ICD-10-CM

## 2013-12-13 ENCOUNTER — Other Ambulatory Visit (INDEPENDENT_AMBULATORY_CARE_PROVIDER_SITE_OTHER): Payer: Medicare Other

## 2013-12-13 DIAGNOSIS — N289 Disorder of kidney and ureter, unspecified: Secondary | ICD-10-CM

## 2013-12-13 LAB — RENAL FUNCTION PANEL
Albumin: 3.7 g/dL (ref 3.5–5.2)
BUN: 19 mg/dL (ref 6–23)
CO2: 28 meq/L (ref 19–32)
CREATININE: 1.2 mg/dL (ref 0.4–1.2)
Calcium: 9.5 mg/dL (ref 8.4–10.5)
Chloride: 102 mEq/L (ref 96–112)
GFR: 46.26 mL/min — AB (ref >60.00)
Glucose, Bld: 108 mg/dL — ABNORMAL HIGH (ref 70–99)
Phosphorus: 3.1 mg/dL (ref 2.3–4.6)
Potassium: 4 mEq/L (ref 3.5–5.1)
SODIUM: 137 meq/L (ref 135–145)

## 2013-12-13 NOTE — Addendum Note (Signed)
Addended by: Marchia Bond on: 12/13/2013 09:04 AM   Modules accepted: Orders

## 2013-12-28 ENCOUNTER — Telehealth: Payer: Self-pay | Admitting: Family Medicine

## 2013-12-28 NOTE — Telephone Encounter (Signed)
Patient Information:  Caller Name: Maisee  Phone: [phone]  Patient: Brooke Beasley, Brooke Beasley  Gender: Female  DOB: 01-30-1931  Age: 80 Years  PCP: [name] [facility])  Office Follow Up:  Does the office need to follow up with this patient?: Yes  Instructions For The Office: Appointments full at [facility], [place], [place] to go to Urgent Care for evaluation as it is not likely that she will get work-in appointment at office this late in the day and she is at another appointment with her spouse during the call.   Symptoms  Reason For Call & Symptoms: Patient calling about "griping pain in stomach", increased gas and feeling rotten.  Intermittent diarrhea and constipation. Pain rated at 5 of 10, intermittent.  Emergent symptoms ruled out. See Today in Office per Abdominal Pain - Female guideline due to Moderate or mild pain that comes and goes (cramps) lasts > 24 hours and Age > 60 years.  Reviewed Health History In EMR: Yes  Reviewed Medications In EMR: Yes  Reviewed Allergies In EMR: Yes  Reviewed Surgeries / Procedures: Yes  Date of Onset of Symptoms: 12/14/2013  Treatments Tried: Gummy Probiotics  Treatments Tried Worked: No  Guideline(s) Used:  Abdominal Pain - Female  Disposition Per Guideline:   See Today in Office  Reason For Disposition Reached:   Moderate or mild pain that comes and goes (cramps) lasts > 24 hours  Advice Given:  Fluids:  Sip clear fluids only (e.g., water, flat soft drinks or 1/2 strength fruit juice) until the pain has been gone for over 2 hours. Then slowly return to a regular diet.  Diet:  Avoid alcohol or caffeinated beverages  Avoid greasy or fatty foods.  Pass a BM:  Sit on the toilet and try to pass a bowel movement (BM). Do not strain. This may relieve the pain if it is due to constipation or impending diarrhea.  Call Back If:  You become worse.  Patient Will Follow Care Advice:  YES

## 2013-12-28 NOTE — Telephone Encounter (Signed)
Agree - recommend either [facility] over weekend if pain very bothersome otherwise eval in office next week.

## 2013-12-28 NOTE — Telephone Encounter (Signed)
Patient notified and verbalized understanding. 

## 2014-01-07 ENCOUNTER — Encounter: Payer: Self-pay | Admitting: Family Medicine

## 2014-01-07 ENCOUNTER — Ambulatory Visit (INDEPENDENT_AMBULATORY_CARE_PROVIDER_SITE_OTHER): Payer: Medicare Other | Admitting: Family Medicine

## 2014-01-07 VITALS — BP 144/82 | HR 80 | Temp 97.9°F | Wt 158.0 lb

## 2014-01-07 DIAGNOSIS — F341 Dysthymic disorder: Secondary | ICD-10-CM

## 2014-01-07 DIAGNOSIS — R1030 Lower abdominal pain, unspecified: Secondary | ICD-10-CM

## 2014-01-07 DIAGNOSIS — F418 Other specified anxiety disorders: Secondary | ICD-10-CM

## 2014-01-07 DIAGNOSIS — I10 Essential (primary) hypertension: Secondary | ICD-10-CM

## 2014-01-07 DIAGNOSIS — G8929 Other chronic pain: Secondary | ICD-10-CM | POA: Insufficient documentation

## 2014-01-07 DIAGNOSIS — R109 Unspecified abdominal pain: Secondary | ICD-10-CM

## 2014-01-07 LAB — POCT URINALYSIS DIPSTICK
BILIRUBIN UA: NEGATIVE
Blood, UA: NEGATIVE
GLUCOSE UA: NEGATIVE
KETONES UA: NEGATIVE
Leukocytes, UA: NEGATIVE
NITRITE UA: NEGATIVE
Protein, UA: NEGATIVE
Spec Grav, UA: 1.025
Urobilinogen, UA: 0.2
pH, UA: 6

## 2014-01-07 MED ORDER — METRONIDAZOLE 500 MG PO TABS: 500.0000 mg | ORAL_TABLET | Freq: Three times a day (TID) | ORAL | Status: DC

## 2014-01-07 MED ORDER — CIPROFLOXACIN HCL 500 MG PO TABS: 500.0000 mg | ORAL_TABLET | Freq: Two times a day (BID) | ORAL | Status: DC

## 2014-01-07 MED ORDER — LOSARTAN POTASSIUM 50 MG PO TABS: 75.0000 mg | ORAL_TABLET | Freq: Every day | ORAL | Status: DC

## 2014-01-07 NOTE — Progress Notes (Signed)
Pre visit review using our clinic review tool, if applicable. No additional management support is needed unless otherwise documented below in the visit note. 

## 2014-01-07 NOTE — Progress Notes (Signed)
BP 144/82  Pulse 80  Temp(Src) 97.9 F (36.6 C) (Oral)  Wt 158 lb (71.668 kg)   CC: BP check, discuss anxiety  Subjective:    Patient ID: Brooke Beasley, female    DOB: May 01, 1930, 80 y.o.   MRN: [id number]  HPI: Brooke Beasley is a 80 y.o. female presenting on 01/07/2014 for Blood Pressure Check and Anxiety   2 wk h/o worsening abd pain with alternating diarrhea/constipation, chills.  No blood in stool or fevers. Abd pain is improving, but still "gripes at times". H/o diverticulosis but unclear if she has had diverticulitis in the past. H/o colon polyps in the past. Has backed off miralax. Stools still not back to normal. COLONOSCOPY Date: 1. 1999 2. 11/04 1. Not finished 2. Slight hemorrhage rectosigmoid area  HTN - on losartan 50mg  daily. Currently off maxzide 2/2 ARF - and kidney function improved while off maxzide.  However, pt notices that bp has been running slightly elevated. No HA, vision changes, CP/tightness, SOB, leg swelling. Occasional dull headache.  Anxiety - increasing recently. More difficult caring for husband recently - deterioration in status - and more stress with her current GI sxs.  Trazodone didn't help sleep. Has restarted librium.  Relevant past medical, surgical, family and social history reviewed and updated as indicated.  Allergies and medications reviewed and updated. Current Outpatient Prescriptions on File Prior to Visit  Medication Sig  . acetaminophen (TYLENOL) 500 MG tablet Take 1,000 mg by mouth 2 (two) times daily.  . Bimatoprost (LUMIGAN) 0.01 % SOLN Apply to eye at bedtime. Put one drop in both eyes at night.  Marland Kitchen BIOTIN PO Take by mouth daily.  . chlordiazePOXIDE (LIBRIUM) 5 MG capsule Take 1 capsule (5 mg total) by mouth 2 (two) times daily as needed for anxiety. For severe anxiety only  . Cholecalciferol (VITAMIN D PO) Take by mouth daily.  . fish oil-omega-3 fatty acids 1000 MG capsule Take 2 g by mouth 3 (three) times a week.  .  Flaxseed, Linseed, (FLAX SEED OIL PO) Take by mouth 3 (three) times a week.  . levothyroxine (SYNTHROID, LEVOTHROID) 75 MCG tablet Take 1 tablet (75 mcg total) by mouth daily.  Marland Kitchen MAGNESIUM CARBONATE PO Take by mouth daily.  . Misc Natural Products (OSTEO BI-FLEX TRIPLE STRENGTH PO) Take 1 tablet by mouth daily.  . polyethylene glycol powder (MIRALAX) powder Take by mouth. OTC as directed.  . Probiotic Product (PROBIOTIC DAILY PO) Take 4 capsules by mouth daily.  . sertraline (ZOLOFT) 100 MG tablet Take 1 tablet (100 mg total) by mouth daily.  . simvastatin (ZOCOR) 10 MG tablet Take 1 tablet (10 mg total) by mouth daily.  . traMADol (ULTRAM) 50 MG tablet Take 1 tablet (50 mg total) by mouth 2 (two) times daily as needed for moderate pain.   No current facility-administered medications on file prior to visit.    Review of Systems Per HPI unless specifically indicated above    Objective:    BP 144/82  Pulse 80  Temp(Src) 97.9 F (36.6 C) (Oral)  Wt 158 lb (71.668 kg)  Physical Exam  Nursing note and vitals reviewed. Constitutional: She appears well-developed and well-nourished. No distress.  HENT:  Mouth/Throat: Oropharynx is clear and moist. No oropharyngeal exudate.  Cardiovascular: Normal rate, regular rhythm, normal heart sounds and intact distal pulses.   No murmur heard. Pulmonary/Chest: Effort normal and breath sounds normal. No respiratory distress. She has no wheezes. She has no rales.  Abdominal: Soft.  Normal appearance and bowel sounds are normal. She exhibits no distension. There is no hepatosplenomegaly. There is tenderness in the suprapubic area and left lower quadrant. There is no rigidity, no rebound, no guarding, no CVA tenderness and negative Murphy's sign.   Results for orders placed in visit on 12/13/13  RENAL FUNCTION PANEL      Result Value Ref Range   Sodium 137  135 - 145 mEq/L   Potassium 4.0  3.5 - 5.1 mEq/L   Chloride 102  96 - 112 mEq/L   CO2 28  19 -  32 mEq/L   Calcium 9.5  8.4 - 10.5 mg/dL   Albumin 3.7  3.5 - 5.2 g/dL   BUN 19  6 - 23 mg/dL   Creatinine, Ser 1.2  0.4 - 1.2 mg/dL   Glucose, Bld 108 (*) 70 - 99 mg/dL   Phosphorus 3.1  2.3 - 4.6 mg/dL   GFR 46.26 (*) >60.00 mL/min      Assessment & Plan:   Problem List Items Addressed This Visit   Lower abdominal pain - Primary     Suspicion for diverticulitis - check UA to r/o UTI. Check CBC, lipase, and CMP. Discussed need to seek care if abd pain sxs persist past 1-2 days. Treat empirically with cipro/flagyl 7d course.    Relevant Orders      Comprehensive metabolic panel      CBC with Differential      Lipase   HYPERTENSION, BENIGN      Deteriorated off maxzide. Increase losartan to 75mg  daily. Lab Results  Component Value Date   CREATININE 1.2 12/13/2013      Relevant Medications      losartan (COZAAR) tablet   Anxiety associated with depression     Continue sertraline 100mg  daily. Continue librax 5mg  bid prn. Anticipate deterioration of anxiety stemming from recent GI distress and husband's worsening state. Anticipate should improve with improvement of GI sxs. See above.        Follow up plan: Return if symptoms worsen or fail to improve.

## 2014-01-07 NOTE — Assessment & Plan Note (Signed)
Continue sertraline 100mg  daily. Continue librax 5mg  bid prn. Anticipate deterioration of anxiety stemming from recent GI distress and husband's worsening state. Anticipate should improve with improvement of GI sxs. See above.

## 2014-01-07 NOTE — Assessment & Plan Note (Signed)
Suspicion for diverticulitis - check UA to r/o UTI. Check CBC, lipase, and CMP. Discussed need to seek care if abd pain sxs persist past 1-2 days. Treat empirically with cipro/flagyl 7d course.

## 2014-01-07 NOTE — Addendum Note (Signed)
Addended by: Royann Shivers A on: 01/07/2014 05:17 PM   Modules accepted: Orders

## 2014-01-07 NOTE — Assessment & Plan Note (Signed)
Deteriorated off maxzide. Increase losartan to 75mg  daily. Lab Results  Component Value Date   CREATININE 1.2 12/13/2013

## 2014-01-07 NOTE — Patient Instructions (Addendum)
Let's stay off maxzide, let's increase losartan to 1.5 tablets daily (75mg  daily). Blood work and urine checked today (urinalysis) for infection - we will call you with results. Ok to take librium twice daily for now.  Start cipro and flagyl antibiotics for next 7 days (sent to pharmacy)

## 2014-01-08 LAB — COMPREHENSIVE METABOLIC PANEL
ALT: 23 U/L (ref 0–35)
AST: 26 U/L (ref 0–37)
Albumin: 3.8 g/dL (ref 3.5–5.2)
Alkaline Phosphatase: 98 U/L (ref 39–117)
BUN: 21 mg/dL (ref 6–23)
CO2: 28 meq/L (ref 19–32)
CREATININE: 1.1 mg/dL (ref 0.4–1.2)
Calcium: 9.1 mg/dL (ref 8.4–10.5)
Chloride: 103 mEq/L (ref 96–112)
GFR: 49.1 mL/min — AB (ref >60.00)
GLUCOSE: 92 mg/dL (ref 70–99)
Potassium: 4.4 mEq/L (ref 3.5–5.1)
Sodium: 137 mEq/L (ref 135–145)
Total Bilirubin: 0.3 mg/dL (ref 0.2–1.2)
Total Protein: 6.8 g/dL (ref 6.0–8.3)

## 2014-01-08 LAB — LIPASE: Lipase: 29 U/L (ref 11.0–59.0)

## 2014-01-09 LAB — CBC WITH DIFFERENTIAL/PLATELET
BASOS PCT: 0.5 % (ref 0.0–3.0)
Basophils Absolute: 0 10*3/uL (ref 0.0–0.1)
Eosinophils Absolute: 0.2 10*3/uL (ref 0.0–0.7)
Eosinophils Relative: 3.2 % (ref 0.0–5.0)
HCT: 37.7 % (ref 36.0–46.0)
HEMOGLOBIN: 12.7 g/dL (ref 12.0–15.0)
Lymphocytes Relative: 31 % (ref 12.0–46.0)
Lymphs Abs: 2.1 10*3/uL (ref 0.7–4.0)
MCHC: 33.6 g/dL (ref 30.0–36.0)
MCV: 93.4 fl (ref 78.0–100.0)
MONOS PCT: 11.8 % (ref 3.0–12.0)
Monocytes Absolute: 0.8 10*3/uL (ref 0.1–1.0)
NEUTROS ABS: 3.6 10*3/uL (ref 1.4–7.7)
Neutrophils Relative %: 53.5 % (ref 43.0–77.0)
Platelets: 236 10*3/uL (ref 150.0–400.0)
RBC: 4.03 Mil/uL (ref 3.87–5.11)
RDW: 14.4 % (ref 11.5–15.5)
WBC: 6.7 10*3/uL (ref 4.0–10.5)

## 2014-01-10 ENCOUNTER — Telehealth: Payer: Self-pay | Admitting: Family Medicine

## 2014-01-10 MED ORDER — CHLORDIAZEPOXIDE HCL 5 MG PO CAPS: 5.0000 mg | ORAL_CAPSULE | Freq: Two times a day (BID) | ORAL | Status: DC | PRN

## 2014-01-10 NOTE — Telephone Encounter (Signed)
Pt requests refill at husband's appt today.

## 2014-01-24 ENCOUNTER — Ambulatory Visit: Payer: Medicare Other | Admitting: Family Medicine

## 2014-02-20 ENCOUNTER — Ambulatory Visit (INDEPENDENT_AMBULATORY_CARE_PROVIDER_SITE_OTHER): Payer: Medicare Other | Admitting: Family Medicine

## 2014-02-20 ENCOUNTER — Encounter: Payer: Self-pay | Admitting: Family Medicine

## 2014-02-20 VITALS — BP 134/82 | HR 80 | Temp 97.9°F | Wt 157.5 lb

## 2014-02-20 DIAGNOSIS — I10 Essential (primary) hypertension: Secondary | ICD-10-CM

## 2014-02-20 DIAGNOSIS — K589 Irritable bowel syndrome without diarrhea: Secondary | ICD-10-CM

## 2014-02-20 DIAGNOSIS — R1030 Lower abdominal pain, unspecified: Secondary | ICD-10-CM

## 2014-02-20 DIAGNOSIS — R197 Diarrhea, unspecified: Secondary | ICD-10-CM

## 2014-02-20 MED ORDER — CHLORDIAZEPOXIDE HCL 5 MG PO CAPS: 5.0000 mg | ORAL_CAPSULE | Freq: Two times a day (BID) | ORAL | Status: DC | PRN

## 2014-02-20 MED ORDER — LOSARTAN POTASSIUM-HCTZ 50-12.5 MG PO TABS: 1.0000 | ORAL_TABLET | Freq: Every day | ORAL | Status: DC

## 2014-02-20 MED ORDER — DICLOFENAC SODIUM 1 % TD GEL: 1.0000 "application " | Freq: Three times a day (TID) | TRANSDERMAL | Status: DC

## 2014-02-20 NOTE — Patient Instructions (Addendum)
Stool tests and kit today for diarrhea. If persistent trouble, let me know for referral to stomach doctor. For leg swelling - stop losartan 57m daily.  Start losartan hctz 50/12.563mdialy. This is a combo pill with diuretic to hopefully help with leg swelling. Continue phillips colon health probiotic.

## 2014-02-20 NOTE — Assessment & Plan Note (Signed)
Stable on losartan 75mg  daily, but leg swelling noted over last few weeks. Will change to losartan hctz 50/12.5mg  daily. Prior had worsening renal insufficiency on maxzide and ARB.

## 2014-02-20 NOTE — Assessment & Plan Note (Signed)
Previously thought diverticulitis flare and treated with 1 wk course cipro/flagyl - no change in sxs while on antibiotics. Now endorses minimal lower abd discomfort but persistent diarrhea. Given persistent diarrhea, will check c diff, stool cultures and iFOB (last colonoscopy 2005). If all normal, anticipate IBS related - consider levsin/bentyl. No red flags like weight loss today. Continue phillips colon health.

## 2014-02-20 NOTE — Progress Notes (Signed)
BP 134/82  Pulse 80  Temp(Src) 97.9 F (36.6 C) (Oral)  Wt 157 lb 8 oz (71.442 kg)   CC: f/u visit, check L leg  Subjective:    Patient ID: Brooke Beasley, female    DOB: June 06, 1930, 80 y.o.   MRN: [id number]  HPI: Brooke Beasley is a 80 y.o. 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 Hospice involved.   Leg swelling L>R worse with change in bp meds. Recently losartan increased to 75mg  daily. maxzide was recently stopped.   Bowel changes - previously treated for presumed diverticulitis with cipro/flagyl 7d course 12/2013 - never improved. Persistent minimal lower abd discomfort with low amt diarrhea several times a day - last week went 8 times one day. This has been ongoing for years but notices increase in stool frequency and lower back pain over last month. Longstanding IBS with alternating diarrhea/constipation. Started phillips colon health probiotic 1 wk ago Wt Readings from Last 3 Encounters:  02/20/14 157 lb 8 oz (71.442 kg)  01/07/14 158 lb (71.668 kg)  11/21/13 156 lb 4 oz (70.875 kg)  Body mass index is 28.8 kg/(m^2).  Lab Results  Component Value Date   TSH 2.21 11/21/2013   Colonoscopy 2005 with diverticulosis - Buccini H/o intestinal bacterial overgrowth in small colon  Tried daughter's voltaren gel for neck and back pain which helped - requests this med.  Relevant past medical, surgical, family and social history reviewed and updated as indicated.  Allergies and medications reviewed and updated. Current Outpatient Prescriptions on File Prior to Visit  Medication Sig  . acetaminophen (TYLENOL) 500 MG tablet Take 1,000 mg by mouth 2 (two) times daily.  . Bimatoprost (LUMIGAN) 0.01 % SOLN Apply to eye at bedtime. Put one drop in both eyes at night.  Marland Kitchen BIOTIN PO Take by mouth daily.  . Cholecalciferol (VITAMIN D PO) Take by mouth daily.  . fish oil-omega-3 fatty acids 1000 MG capsule Take 2 g by  mouth 3 (three) times a week.  . Flaxseed, Linseed, (FLAX SEED OIL PO) Take by mouth 3 (three) times a week.  . levothyroxine (SYNTHROID, LEVOTHROID) 75 MCG tablet Take 1 tablet (75 mcg total) by mouth daily.  Marland Kitchen MAGNESIUM CARBONATE PO Take by mouth daily.  . Misc Natural Products (OSTEO BI-FLEX TRIPLE STRENGTH PO) Take 1 tablet by mouth daily.  . polyethylene glycol powder (MIRALAX) powder Take by mouth. OTC as directed.  . Probiotic Product (PROBIOTIC DAILY PO) Take 4 capsules by mouth daily.  . sertraline (ZOLOFT) 100 MG tablet Take 1 tablet (100 mg total) by mouth daily.  . simvastatin (ZOCOR) 10 MG tablet Take 1 tablet (10 mg total) by mouth daily.  . traMADol (ULTRAM) 50 MG tablet Take 1 tablet (50 mg total) by mouth 2 (two) times daily as needed for moderate pain.   No current facility-administered medications on file prior to visit.    Review of Systems Per HPI unless specifically indicated above    Objective:    BP 134/82  Pulse 80  Temp(Src) 97.9 F (36.6 C) (Oral)  Wt 157 lb 8 oz (71.442 kg)  Physical Exam  Nursing note and vitals reviewed. Constitutional: She appears well-developed and well-nourished. No distress.  HENT:  Mouth/Throat: Oropharynx is clear and moist. No oropharyngeal exudate.  Cardiovascular: Normal rate, regular rhythm, normal heart sounds and intact distal pulses.   No murmur heard. Pulmonary/Chest: Effort normal and breath sounds  normal. No respiratory distress. She has no wheezes. She has no rales.  Abdominal: Soft. Normal appearance and bowel sounds are normal. She exhibits no distension and no mass. There is no hepatosplenomegaly. There is tenderness (minimal) in the right lower quadrant, periumbilical area and left lower quadrant. There is no rigidity, no rebound, no guarding, no CVA tenderness and negative Murphy's sign.  Musculoskeletal: She exhibits edema (L>R mild nonpitting edema present).  No palpable cords 2+ DP bilaterally  Skin: Skin is  warm and dry. No rash noted.  Psychiatric: She has a normal mood and affect.       Lab Results  Component Value Date   CREATININE 1.1 01/07/2014    Lab Results  Component Value Date   K 4.4 01/07/2014    Assessment & Plan:   Problem List Items Addressed This Visit   Lower abdominal pain     Previously thought diverticulitis flare and treated with 1 wk course cipro/flagyl - no change in sxs while on antibiotics. Now endorses minimal lower abd discomfort but persistent diarrhea. Given persistent diarrhea, will check c diff, stool cultures and iFOB (last colonoscopy 2005). If all normal, anticipate IBS related - consider levsin/bentyl. No red flags like weight loss today. Continue phillips colon health.    IBS     See above.    HYPERTENSION, BENIGN - Primary     Stable on losartan 75mg  daily, but leg swelling noted over last few weeks. Will change to losartan hctz 50/12.5mg  daily. Prior had worsening renal insufficiency on maxzide and ARB.    Relevant Medications      LOSARTAN POTASSIUM-HCTZ 50-12.5 MG PO TABS    Other Visit Diagnoses   Diarrhea        Relevant Orders       Stool culture       C. difficile GDH and Toxin A/B       Fecal occult blood, imunochemical        Follow up plan: Return in about 3 months (around 05/23/2014), or as needed, for follow up visit.

## 2014-02-20 NOTE — Assessment & Plan Note (Signed)
See above

## 2014-02-20 NOTE — Progress Notes (Signed)
Pre visit review using our clinic review tool, if applicable. No additional management support is needed unless otherwise documented below in the visit note. 

## 2014-02-21 ENCOUNTER — Telehealth: Payer: Self-pay | Admitting: Family Medicine

## 2014-02-21 LAB — C. DIFFICILE GDH AND TOXIN A/B
C. DIFF TOXIN A/B: NOT DETECTED
C. DIFFICILE GDH: NOT DETECTED

## 2014-02-21 NOTE — Telephone Encounter (Signed)
emmi emailed °

## 2014-02-22 ENCOUNTER — Ambulatory Visit: Payer: Medicare Other | Admitting: Family Medicine

## 2014-02-24 LAB — STOOL CULTURE

## 2014-02-28 ENCOUNTER — Other Ambulatory Visit: Payer: Medicare Other

## 2014-02-28 DIAGNOSIS — R197 Diarrhea, unspecified: Secondary | ICD-10-CM

## 2014-03-01 LAB — FECAL OCCULT BLOOD, IMMUNOCHEMICAL: Fecal Occult Bld: NEGATIVE

## 2014-03-25 ENCOUNTER — Other Ambulatory Visit: Payer: Self-pay | Admitting: Family Medicine

## 2014-03-25 MED ORDER — LOSARTAN POTASSIUM-HCTZ 50-12.5 MG PO TABS: 1.0000 | ORAL_TABLET | Freq: Every day | ORAL | Status: DC

## 2014-03-26 ENCOUNTER — Other Ambulatory Visit: Payer: Self-pay | Admitting: *Deleted

## 2014-03-26 MED ORDER — LEVOTHYROXINE SODIUM 75 MCG PO TABS: 75.0000 ug | ORAL_TABLET | Freq: Every day | ORAL | Status: DC

## 2014-03-29 ENCOUNTER — Telehealth: Payer: Self-pay | Admitting: Family Medicine

## 2014-03-29 DIAGNOSIS — R197 Diarrhea, unspecified: Secondary | ICD-10-CM

## 2014-03-29 DIAGNOSIS — R1030 Lower abdominal pain, unspecified: Secondary | ICD-10-CM

## 2014-03-29 NOTE — Telephone Encounter (Signed)
Pt is calling in ref to being referred to a GI dr. Lilian Coma you place referral or does pt need an apptmt first? Pt requests c/b. Thank you.

## 2014-03-29 NOTE — Telephone Encounter (Signed)
Referral placed. plz notify pt to expect a call in next few weeks

## 2014-03-29 NOTE — Telephone Encounter (Signed)
Patient notified and will await call for referral.

## 2014-04-11 ENCOUNTER — Telehealth: Payer: Self-pay | Admitting: Family Medicine

## 2014-04-11 ENCOUNTER — Other Ambulatory Visit: Payer: Self-pay | Admitting: Family Medicine

## 2014-04-11 MED ORDER — MIRTAZAPINE 15 MG PO TABS: 15.0000 mg | ORAL_TABLET | Freq: Every day | ORAL | Status: DC

## 2014-04-11 NOTE — Telephone Encounter (Signed)
during husband's visit wife requests med ot help sleep. Trazodone ineffective. Melatonin ineffective as well. Will try remeron.

## 2014-04-22 ENCOUNTER — Other Ambulatory Visit: Payer: Self-pay

## 2014-04-22 DIAGNOSIS — Z1231 Encounter for screening mammogram for malignant neoplasm of breast: Secondary | ICD-10-CM

## 2014-04-23 ENCOUNTER — Encounter: Payer: Self-pay | Admitting: Family Medicine

## 2014-04-25 ENCOUNTER — Ambulatory Visit
Admission: RE | Admit: 2014-04-25 | Discharge: 2014-04-25 | Disposition: A | Discharge status: Home / Self Care | Payer: Medicare Other | Source: Ambulatory Visit

## 2014-04-25 DIAGNOSIS — Z1231 Encounter for screening mammogram for malignant neoplasm of breast: Secondary | ICD-10-CM

## 2014-04-25 LAB — HM MAMMOGRAPHY: HM MAMMO: NORMAL

## 2014-04-26 DIAGNOSIS — M1120 Other chondrocalcinosis, unspecified site: Secondary | ICD-10-CM

## 2014-04-26 DIAGNOSIS — M1990 Unspecified osteoarthritis, unspecified site: Secondary | ICD-10-CM

## 2014-04-26 HISTORY — DX: Unspecified osteoarthritis, unspecified site: M19.90

## 2014-04-26 HISTORY — DX: Other chondrocalcinosis, unspecified site: M11.20

## 2014-04-29 ENCOUNTER — Encounter: Payer: Self-pay | Admitting: *Deleted

## 2014-05-13 ENCOUNTER — Other Ambulatory Visit: Payer: Self-pay | Admitting: Family Medicine

## 2014-05-13 NOTE — Telephone Encounter (Signed)
Ok to refill 

## 2014-05-24 ENCOUNTER — Ambulatory Visit (INDEPENDENT_AMBULATORY_CARE_PROVIDER_SITE_OTHER): Payer: BLUE CROSS/BLUE SHIELD | Admitting: Family Medicine

## 2014-05-24 ENCOUNTER — Encounter: Payer: Self-pay | Admitting: Family Medicine

## 2014-05-24 VITALS — BP 116/72 | HR 76 | Temp 97.7°F | Wt 161.2 lb

## 2014-05-24 DIAGNOSIS — I10 Essential (primary) hypertension: Secondary | ICD-10-CM

## 2014-05-24 DIAGNOSIS — F418 Other specified anxiety disorders: Secondary | ICD-10-CM

## 2014-05-24 DIAGNOSIS — K589 Irritable bowel syndrome without diarrhea: Secondary | ICD-10-CM

## 2014-05-24 MED ORDER — SERTRALINE HCL 100 MG PO TABS: 150.0000 mg | ORAL_TABLET | Freq: Every day | ORAL | Status: DC

## 2014-05-24 MED ORDER — MIRTAZAPINE 15 MG PO TABS: ORAL_TABLET | ORAL | Status: DC

## 2014-05-24 NOTE — Assessment & Plan Note (Signed)
Noticing persistent depressed mood. Will increase sertraline to 150mg  daily as well as mirtazapine 15mg  nightly.

## 2014-05-24 NOTE — Patient Instructions (Signed)
Let's increase sertraline to 150mg  daily, continue remeron 15mg  nightly. Return in 4 months for medicare wellness visit.

## 2014-05-24 NOTE — Assessment & Plan Note (Signed)
Alternates diarrhea/ constipation. Continue probiotic align. Phillips colon health not effective. Does not want to return to [place] (too far). If needs GI, would like local at [facility]

## 2014-05-24 NOTE — Progress Notes (Signed)
Pre visit review using our clinic review tool, if applicable. No additional management support is needed unless otherwise documented below in the visit note. 

## 2014-05-24 NOTE — Progress Notes (Signed)
BP 116/72 mmHg  Pulse 76  Temp(Src) 97.7 F (36.5 C) (Oral)  Wt 161 lb 4 oz (73.143 kg)   CC: 3 mo f/u visit  Subjective:    Patient ID: Brooke Beasley, female    DOB: 10/17/1930, 81 y.o.   MRN: [id number]  HPI: Brooke Beasley is a 81 y.o. female presenting on 05/24/2014 for Follow-up   Staying depressed and tired. Caregiver for husband. Longstanding sertraline 100mg , recent addition of mirtazapine 15mg  daily (over last 3 months). Feels this has helped. Interested in increase in sertraline.   Lab Results  Component Value Date   TSH 2.21 11/21/2013    Diarrhea - saw GI who recommended immodium which constipates her. Does not want to return to see GI. Took phillips probiotic for 2 months, now on aligh for last month.   HTN - Compliant with current antihypertensive regimen of hyzaar 50/12.5mg  daily.  Does not check blood pressures at home.  No low blood pressure readings or symptoms of dizziness/syncope.  Denies HA, vision changes, CP/tightness, SOB, leg swelling.    Relevant past medical, surgical, family and social history reviewed and updated as indicated. Interim medical history since our last visit reviewed. Allergies and medications reviewed and updated. Current Outpatient Prescriptions on File Prior to Visit  Medication Sig  . acetaminophen (TYLENOL) 500 MG tablet Take 1,000 mg by mouth 2 (two) times daily.  . Bimatoprost (LUMIGAN) 0.01 % SOLN Apply to eye at bedtime. Put one drop in both eyes at night.  Marland Kitchen BIOTIN PO Take by mouth daily.  . chlordiazePOXIDE (LIBRIUM) 5 MG capsule Take 1 capsule (5 mg total) by mouth 2 (two) times daily as needed for anxiety.  . Cholecalciferol (VITAMIN D PO) Take by mouth daily.  . diclofenac sodium (VOLTAREN) 1 % GEL Apply 1 application topically 3 (three) times daily.  . fish oil-omega-3 fatty acids 1000 MG capsule Take 2 g by mouth 3 (three) times a week.  . levothyroxine (SYNTHROID, LEVOTHROID) 75 MCG tablet Take 1 tablet (75 mcg total)  by mouth daily.  Marland Kitchen losartan-hydrochlorothiazide (HYZAAR) 50-12.5 MG per tablet Take 1 tablet by mouth daily.  . Misc Natural Products (OSTEO BI-FLEX TRIPLE STRENGTH PO) Take 1 tablet by mouth daily. With Vit D  . polyethylene glycol powder (MIRALAX) powder Take by mouth. OTC as directed.  . Probiotic Product (PROBIOTIC DAILY PO) Take 1 capsule by mouth daily.   . simvastatin (ZOCOR) 10 MG tablet TAKE 1 BY MOUTH DAILY  . traMADol (ULTRAM) 50 MG tablet Take 1 tablet (50 mg total) by mouth 2 (two) times daily as needed for moderate pain. (Patient not taking: Reported on 05/24/2014)   No current facility-administered medications on file prior to visit.    Review of Systems Per HPI unless specifically indicated above     Objective:    BP 116/72 mmHg  Pulse 76  Temp(Src) 97.7 F (36.5 C) (Oral)  Wt 161 lb 4 oz (73.143 kg)  Wt Readings from Last 3 Encounters:  05/24/14 161 lb 4 oz (73.143 kg)  02/20/14 157 lb 8 oz (71.442 kg)  01/07/14 158 lb (71.668 kg)    Physical Exam  Constitutional: She appears well-developed and well-nourished. No distress.  HENT:  Mouth/Throat: Oropharynx is clear and moist. No oropharyngeal exudate.  Eyes: Conjunctivae and EOM are normal. Pupils are equal, round, and reactive to light. No scleral icterus.  Neck: Normal range of motion. Neck supple.  Cardiovascular: Normal rate, regular rhythm, normal heart sounds and intact  distal pulses.   No murmur heard. Pulmonary/Chest: Effort normal and breath sounds normal. No respiratory distress. She has no wheezes. She has no rales.  Musculoskeletal: She exhibits no edema.  Lymphadenopathy:    She has no cervical adenopathy.  Skin: Skin is warm and dry. No rash noted.  Psychiatric: She has a normal mood and affect.  Nursing note and vitals reviewed.  Results for orders placed or performed in visit on 04/29/14  HM MAMMOGRAPHY  Result Value Ref Range   [facility] Mammogram Normal-Birads 1-Repeat 1 year       Assessment  & Plan:   Problem List Items Addressed This Visit    IBS    Alternates diarrhea/ constipation. Continue probiotic align. [facility] colon health not effective. Does not want to return to [place] (too far). If needs GI, would like local at [place] [facility]    Chronic, stable. Continue hyzaar regimen.      Anxiety associated with depression    Noticing persistent depressed mood. Will increase sertraline to 150mg  daily as well as mirtazapine 15mg  nightly.           Follow up plan: No Follow-up on file.

## 2014-05-24 NOTE — Assessment & Plan Note (Signed)
Chronic, stable. Continue hyzaar regimen.

## 2014-06-13 ENCOUNTER — Ambulatory Visit (INDEPENDENT_AMBULATORY_CARE_PROVIDER_SITE_OTHER): Payer: BLUE CROSS/BLUE SHIELD | Admitting: Family Medicine

## 2014-06-13 ENCOUNTER — Encounter: Payer: Self-pay | Admitting: Family Medicine

## 2014-06-13 VITALS — BP 170/82 | HR 64 | Temp 98.0°F | Wt 162.2 lb

## 2014-06-13 DIAGNOSIS — R42 Dizziness and giddiness: Secondary | ICD-10-CM | POA: Insufficient documentation

## 2014-06-13 DIAGNOSIS — R110 Nausea: Secondary | ICD-10-CM

## 2014-06-13 DIAGNOSIS — E039 Hypothyroidism, unspecified: Secondary | ICD-10-CM

## 2014-06-13 LAB — CBC WITH DIFFERENTIAL/PLATELET
BASOS PCT: 0.5 % (ref 0.0–3.0)
Basophils Absolute: 0 10*3/uL (ref 0.0–0.1)
Eosinophils Absolute: 0.7 10*3/uL (ref 0.0–0.7)
Eosinophils Relative: 8.3 % — ABNORMAL HIGH (ref 0.0–5.0)
HCT: 41.6 % (ref 36.0–46.0)
HEMOGLOBIN: 14.2 g/dL (ref 12.0–15.0)
Lymphocytes Relative: 24.3 % (ref 12.0–46.0)
Lymphs Abs: 2.1 10*3/uL (ref 0.7–4.0)
MCHC: 34.1 g/dL (ref 30.0–36.0)
MCV: 88.5 fl (ref 78.0–100.0)
Monocytes Absolute: 1 10*3/uL (ref 0.1–1.0)
Monocytes Relative: 11 % (ref 3.0–12.0)
NEUTROS ABS: 4.8 10*3/uL (ref 1.4–7.7)
Neutrophils Relative %: 55.9 % (ref 43.0–77.0)
Platelets: 276 10*3/uL (ref 150.0–400.0)
RBC: 4.7 Mil/uL (ref 3.87–5.11)
RDW: 14.3 % (ref 11.5–15.5)
WBC: 8.6 10*3/uL (ref 4.0–10.5)

## 2014-06-13 LAB — COMPREHENSIVE METABOLIC PANEL
ALBUMIN: 4.2 g/dL (ref 3.5–5.2)
ALT: 17 U/L (ref 0–35)
AST: 27 U/L (ref 0–37)
Alkaline Phosphatase: 108 U/L (ref 39–117)
BUN: 19 mg/dL (ref 6–23)
CHLORIDE: 92 meq/L — AB (ref 96–112)
CO2: 30 meq/L (ref 19–32)
Calcium: 9.6 mg/dL (ref 8.4–10.5)
Creatinine, Ser: 1.04 mg/dL (ref 0.40–1.20)
GFR: 53.98 mL/min — AB (ref >60.00)
GLUCOSE: 80 mg/dL (ref 70–99)
POTASSIUM: 4.4 meq/L (ref 3.5–5.1)
SODIUM: 128 meq/L — AB (ref 135–145)
TOTAL PROTEIN: 7.4 g/dL (ref 6.0–8.3)
Total Bilirubin: 0.5 mg/dL (ref 0.2–1.2)

## 2014-06-13 LAB — TSH: TSH: 3.55 u[IU]/mL (ref 0.35–4.50)

## 2014-06-13 MED ORDER — MIRTAZAPINE 7.5 MG PO TABS: ORAL_TABLET | ORAL | Status: DC

## 2014-06-13 MED ORDER — SERTRALINE HCL 100 MG PO TABS: 100.0000 mg | ORAL_TABLET | Freq: Every day | ORAL | Status: DC

## 2014-06-13 NOTE — Assessment & Plan Note (Signed)
Benign neurological exam. ?if recent change to antidepressant regimen causing some of these sxs - check CMP, CBC, TSH. Return to prior dose of sertraline 100mg  daily, decrease remeron (mirtazapine) to 1/2 tablet 7.5mg  nightly for sleep. Update [place] if not improving with these changes. Anticipate hypertension due to acutely feeling ill - prior checks bp well controlled. Pt comfortable with plan. BP Readings from Last 3 Encounters:  06/13/14 170/82  05/24/14 116/72  02/20/14 134/82

## 2014-06-13 NOTE — Progress Notes (Signed)
BP 170/82 mmHg  Pulse 64  Temp(Src) 98 F (36.7 C) (Oral)  Wt 162 lb 4 oz (73.596 kg)   CC: dizzy, nausea  Subjective:    Patient ID: Brooke Beasley, female    DOB: 01-Jun-1930, 81 y.o.   MRN: [id number]  HPI: DONYEL CASTAGNOLA is a 80 y.o. female presenting on 06/13/2014 for Dizziness   3-4 day h/o dizziness described as lightheadedness worse with standing or sudden head turs. This has been associated with nausea. Endorses bitemporal pressure pain. This may have some wheezing and congestion in chest.   Compliant with hyzaar 50/12.5mg  daily. bp elevated today, but pt endorses running well controlled at home.   No fevers/chills, vision changes. No chest pain. Passing gas well.   IBS - now with constipation. Saw GI for chronic diarrhea. immodium constipates her. Has been taking miralax.   Longstanding sertraline 100mg  daily, recently increased to 150mg  daily. Recently started mirtazapine 15mg  nightly for sleep and mood. Still with trouble sleeping.  Relevant past medical, surgical, family and social history reviewed and updated as indicated. Interim medical history since our last visit reviewed. Allergies and medications reviewed and updated. Current Outpatient Prescriptions on File Prior to Visit  Medication Sig  . acetaminophen (TYLENOL) 500 MG tablet Take 1,000 mg by mouth 2 (two) times daily.  Marland Kitchen BIOTIN PO Take by mouth daily.  . chlordiazePOXIDE (LIBRIUM) 5 MG capsule Take 1 capsule (5 mg total) by mouth 2 (two) times daily as needed for anxiety.  . Cholecalciferol (VITAMIN D PO) Take by mouth daily.  . diclofenac sodium (VOLTAREN) 1 % GEL Apply 1 application topically 3 (three) times daily.  . fish oil-omega-3 fatty acids 1000 MG capsule Take 2 g by mouth 3 (three) times a week.  . levothyroxine (SYNTHROID, LEVOTHROID) 75 MCG tablet Take 1 tablet (75 mcg total) by mouth daily.  Marland Kitchen losartan-hydrochlorothiazide (HYZAAR) 50-12.5 MG per tablet Take 1 tablet by mouth daily.  . Misc  Natural Products (OSTEO BI-FLEX TRIPLE STRENGTH PO) Take 1 tablet by mouth daily. With Vit D  . polyethylene glycol powder (MIRALAX) powder Take by mouth. OTC as directed.  . Probiotic Product (PROBIOTIC DAILY PO) Take 1 capsule by mouth daily.   . simvastatin (ZOCOR) 10 MG tablet TAKE 1 BY MOUTH DAILY  . traMADol (ULTRAM) 50 MG tablet Take 1 tablet (50 mg total) by mouth 2 (two) times daily as needed for moderate pain.   No current facility-administered medications on file prior to visit.    Review of Systems Per HPI unless specifically indicated above     Objective:    BP 170/82 mmHg  Pulse 64  Temp(Src) 98 F (36.7 C) (Oral)  Wt 162 lb 4 oz (73.596 kg)  Wt Readings from Last 3 Encounters:  06/13/14 162 lb 4 oz (73.596 kg)  05/24/14 161 lb 4 oz (73.143 kg)  02/20/14 157 lb 8 oz (71.442 kg)    Physical Exam  Constitutional: She is oriented to person, place, and time. She appears well-developed and well-nourished. No distress.  HENT:  Head: Normocephalic and atraumatic.  Right Ear: Tympanic membrane, external ear and ear canal normal.  Left Ear: Tympanic membrane, external ear and ear canal normal.  Mouth/Throat: Oropharynx is clear and moist. No oropharyngeal exudate.  Hearing aides in place  Cardiovascular: Normal rate, regular rhythm and intact distal pulses.   Murmur (2/6 SEM best at LUSB) heard. Pulmonary/Chest: Effort normal and breath sounds normal. No respiratory distress. She has no wheezes. She  has no rales.  Abdominal: Soft. Normal appearance and bowel sounds are normal. She exhibits no distension and no mass. There is no hepatosplenomegaly. There is no tenderness. There is no rigidity, no rebound, no guarding, no CVA tenderness and negative Murphy's sign.  Musculoskeletal: She exhibits no edema.  Neurological: She is alert and oriented to person, place, and time. She has normal strength. No cranial nerve deficit or sensory deficit. She displays a negative Romberg  sign. Coordination and gait normal.  CN 2- 12 intact FTN intact No pronator drift  Skin: Skin is warm and dry. No rash noted.  Psychiatric: She has a normal mood and affect.  Nursing note and vitals reviewed.   Mildly positive orthostatics - sitting 164/80, 64, standing 148/92, 82.    Assessment & Plan:   Problem List Items Addressed This Visit    Hypothyroidism   Dizziness - Primary    Benign neurological exam. ?if recent change to antidepressant regimen causing some of these sxs - check CMP, CBC, TSH. Return to prior dose of sertraline 100mg  daily, decrease remeron (mirtazapine) to 1/2 tablet 7.5mg  nightly for sleep. Update [place] if not improving with these changes. Anticipate hypertension due to acutely feeling ill - prior checks bp well controlled. Pt comfortable with plan. BP Readings from Last 3 Encounters:  06/13/14 170/82  05/24/14 116/72  02/20/14 134/82        Relevant Orders   Comprehensive metabolic panel   TSH   CBC with Differential/Platelet    Other Visit Diagnoses    Nausea without vomiting            Follow up plan: Return if symptoms worsen or fail to improve.

## 2014-06-13 NOTE — Progress Notes (Signed)
Pre visit review using our clinic review tool, if applicable. No additional management support is needed unless otherwise documented below in the visit note. 

## 2014-06-13 NOTE — Patient Instructions (Addendum)
Decrease mirtazapine to 1/2 tablet of 7.5mg  nightly (lower dose sent to pharmacy).  Decrease sertraline (zoloft) back to 100mg  daily. I wonder if these medicines are causing your dizziness. labwork today - we'll call you with results.

## 2014-06-14 ENCOUNTER — Other Ambulatory Visit: Payer: Self-pay | Admitting: Family Medicine

## 2014-06-14 DIAGNOSIS — E871 Hypo-osmolality and hyponatremia: Secondary | ICD-10-CM

## 2014-06-18 ENCOUNTER — Ambulatory Visit (INDEPENDENT_AMBULATORY_CARE_PROVIDER_SITE_OTHER): Payer: BLUE CROSS/BLUE SHIELD | Admitting: Family Medicine

## 2014-06-18 ENCOUNTER — Encounter: Payer: Self-pay | Admitting: Family Medicine

## 2014-06-18 VITALS — BP 130/66 | HR 80 | Temp 98.0°F | Wt 164.2 lb

## 2014-06-18 DIAGNOSIS — F418 Other specified anxiety disorders: Secondary | ICD-10-CM

## 2014-06-18 DIAGNOSIS — E871 Hypo-osmolality and hyponatremia: Secondary | ICD-10-CM

## 2014-06-18 DIAGNOSIS — K589 Irritable bowel syndrome without diarrhea: Secondary | ICD-10-CM

## 2014-06-18 DIAGNOSIS — K219 Gastro-esophageal reflux disease without esophagitis: Secondary | ICD-10-CM

## 2014-06-18 DIAGNOSIS — R350 Frequency of micturition: Secondary | ICD-10-CM

## 2014-06-18 DIAGNOSIS — E039 Hypothyroidism, unspecified: Secondary | ICD-10-CM

## 2014-06-18 LAB — BASIC METABOLIC PANEL
BUN: 17 mg/dL (ref 6–23)
CALCIUM: 9.5 mg/dL (ref 8.4–10.5)
CO2: 33 mEq/L — ABNORMAL HIGH (ref 19–32)
Chloride: 95 mEq/L — ABNORMAL LOW (ref 96–112)
Creatinine, Ser: 1.09 mg/dL (ref 0.40–1.20)
GFR: 51.13 mL/min — ABNORMAL LOW (ref >60.00)
Glucose, Bld: 125 mg/dL — ABNORMAL HIGH (ref 70–99)
POTASSIUM: 3.8 meq/L (ref 3.5–5.1)
SODIUM: 132 meq/L — AB (ref 135–145)

## 2014-06-18 LAB — POCT URINALYSIS DIPSTICK
Bilirubin, UA: NEGATIVE
Blood, UA: NEGATIVE
Glucose, UA: NEGATIVE
Ketones, UA: NEGATIVE
Leukocytes, UA: NEGATIVE
Nitrite, UA: NEGATIVE
Protein, UA: NEGATIVE
Spec Grav, UA: 1.015
UROBILINOGEN UA: 0.2
pH, UA: 6

## 2014-06-18 MED ORDER — PANTOPRAZOLE SODIUM 40 MG PO TBEC: 40.0000 mg | DELAYED_RELEASE_TABLET | Freq: Every day | ORAL | Status: DC

## 2014-06-18 MED ORDER — CHLORDIAZEPOXIDE HCL 5 MG PO CAPS: 5.0000 mg | ORAL_CAPSULE | Freq: Two times a day (BID) | ORAL | Status: DC | PRN

## 2014-06-18 NOTE — Assessment & Plan Note (Addendum)
Chronic constipation but also alternating with diarrhea in h/o IBS - anticipate IBS playing large portion of symptomatology today. Recently with increased malaise and bloating/gassiness despite probiotic daily. rec stop metamucil, ok to continue miralax, trial MOM prn constipation, and started protonix and gasX. Update [place] with effect of change in regimen. UA today normal.

## 2014-06-18 NOTE — Progress Notes (Signed)
Pre visit review using our clinic review tool, if applicable. No additional management support is needed unless otherwise documented below in the visit note. 

## 2014-06-18 NOTE — Patient Instructions (Addendum)
For bloating - let's restart gasX. Start protonix 40mg  daily. Stop metamucil for now. Continue miralax 1/2-1 capful every day - hold for diarrhea.  Same for milk of magnesia - start 57mL daily, hold for diarrhea  Blood work and urine checked today.

## 2014-06-18 NOTE — Progress Notes (Signed)
BP 130/66 mmHg  Pulse 80  Temp(Src) 98 F (36.7 C) (Oral)  Wt 164 lb 4 oz (74.503 kg)  SpO2 96%   CC: constipation, persistent malaise Subjective:    Patient ID: Brooke Beasley, female    DOB: 1930/07/22, 81 y.o.   MRN: [id number]  HPI: Brooke Beasley is a 81 y.o. female presenting on 06/18/2014 for Constipation   Seen 06/13/2014 with malaise, dizziness, found to have hyponatremia 2/2 recently increased sertraline and mirtazapine. Sertraline was returned to prior dose of 100mg  daily, and mirtazpine was decreased to1/2 tab of 7.5mg  daily.  Still feeling dizzy but some better. This morning started feeling bloating and constipated. No abdominal pain. Taking metamucil heaping spoonful daily as well as [employer] brand stool softeners, as well as some miralax. Taking probiotic daily as well. Last BM last night large and soft - hemorrhoids acting up as well. No known fevers or chills, vomiting. + some worsening nausea and heartburn/GERD longstanding for last year. She is taking pepcid for this. No recent MOM use.   Chronic IBS with h/o intestinal bacterial overgrowth in the past as well. H/o severe diverticulosis by colonoscopy in the past.   No recent tramadol use. Did take librium which was helpful.  Denies dysuria. + frequency and urgency with some accidents. Ongoing issue over the last month.   Relevant past medical, surgical, family and social history reviewed and updated as indicated. Interim medical history since our last visit reviewed. Allergies and medications reviewed and updated. Current Outpatient Prescriptions on File Prior to Visit  Medication Sig  . acetaminophen (TYLENOL) 500 MG tablet Take 1,000 mg by mouth 2 (two) times daily.  Marland Kitchen BIOTIN PO Take by mouth daily.  . Cholecalciferol (VITAMIN D PO) Take by mouth daily.  . diclofenac sodium (VOLTAREN) 1 % GEL Apply 1 application topically 3 (three) times daily.  . fish oil-omega-3 fatty acids 1000 MG capsule Take 2 g by mouth 3  (three) times a week.  . latanoprost (XALATAN) 0.005 % ophthalmic solution Place 1 drop into both eyes at bedtime.  Marland Kitchen levothyroxine (SYNTHROID, LEVOTHROID) 75 MCG tablet Take 1 tablet (75 mcg total) by mouth daily.  Marland Kitchen losartan-hydrochlorothiazide (HYZAAR) 50-12.5 MG per tablet Take 1 tablet by mouth daily.  . mirtazapine (REMERON) 7.5 MG tablet TAKE 1/2 BY MOUTH AT BEDTIME  . Misc Natural Products (OSTEO BI-FLEX TRIPLE STRENGTH PO) Take 1 tablet by mouth daily. With Vit D  . polyethylene glycol powder (MIRALAX) powder Take by mouth. OTC as directed.  . Probiotic Product (PROBIOTIC DAILY PO) Take 1 capsule by mouth daily.   . sertraline (ZOLOFT) 100 MG tablet Take 1 tablet (100 mg total) by mouth daily.  . simvastatin (ZOCOR) 10 MG tablet TAKE 1 BY MOUTH DAILY  . traMADol (ULTRAM) 50 MG tablet Take 1 tablet (50 mg total) by mouth 2 (two) times daily as needed for moderate pain.   No current facility-administered medications on file prior to visit.    Review of Systems Per HPI unless specifically indicated above     Objective:    BP 130/66 mmHg  Pulse 80  Temp(Src) 98 F (36.7 C) (Oral)  Wt 164 lb 4 oz (74.503 kg)  SpO2 96%  Wt Readings from Last 3 Encounters:  06/18/14 164 lb 4 oz (74.503 kg)  06/13/14 162 lb 4 oz (73.596 kg)  05/24/14 161 lb 4 oz (73.143 kg)    Physical Exam  Constitutional: She appears well-developed and well-nourished. No distress.  HENT:  Mouth/Throat: Oropharynx is clear and moist. No oropharyngeal exudate.  Cardiovascular: Normal rate, regular rhythm, normal heart sounds and intact distal pulses.   No murmur heard. Pulmonary/Chest: Effort normal and breath sounds normal. No respiratory distress. She has no wheezes. She has no rales.  Abdominal: Soft. Normal appearance and bowel sounds are normal. She exhibits no distension and no mass. There is no hepatosplenomegaly. There is no tenderness. There is no rigidity, no rebound, no guarding, no CVA tenderness  and negative Murphy's sign.  Musculoskeletal: She exhibits no edema.  Neurological: She displays a negative Romberg sign. Coordination and gait normal.  CN 2-12 intact FTN intact  Skin: Skin is warm and dry. No rash noted.  Nursing note and vitals reviewed.  Results for orders placed or performed in visit on 06/18/14  POCT Urinalysis Dipstick  Result Value Ref Range   Color, UA Yellow    Clarity, UA Clear    Glucose, UA Negative    Bilirubin, UA Negative    Ketones, UA Negative    Spec Grav, UA 1.015    Blood, UA Negative    pH, UA 6.0    Protein, UA Negative    Urobilinogen, UA 0.2    Nitrite, UA Negative    Leukocytes, UA Negative       Assessment & Plan:   Problem List Items Addressed This Visit    IBS - Primary    Chronic constipation but also alternating with diarrhea in h/o IBS - anticipate IBS playing large portion of symptomatology today. Recently with increased malaise and bloating/gassiness despite probiotic daily. rec stop metamucil, ok to continue miralax, trial MOM prn constipation, and started protonix and gasX. Update [place] with effect of change in regimen. UA today normal.      Relevant Medications   pantoprazole (PROTONIX) EC tablet   Hypothyroidism    Stable last check 05/2014.      GERD (gastroesophageal reflux disease)    Describes recently worsening GERD - will start protonix 40mg  daily for 3 wks.      Relevant Medications   pantoprazole (PROTONIX) EC tablet   Anxiety associated with depression    We have decreased mirtazapine to 3.75 mg nightly prn, and have decreased sertraline back to 100mg  daily. Recheck Na today. Advised ok to use librium prn sparingly.        Other Visit Diagnoses    Hyponatremia        Relevant Orders    Basic metabolic panel    Osmolality, urine    Urine frequency        Relevant Orders    POCT Urinalysis Dipstick (Completed)        Follow up plan: No Follow-up on file.

## 2014-06-18 NOTE — Assessment & Plan Note (Signed)
Describes recently worsening GERD - will start protonix 40mg  daily for 3 wks.

## 2014-06-18 NOTE — Assessment & Plan Note (Addendum)
Stable last check 05/2014.

## 2014-06-18 NOTE — Assessment & Plan Note (Signed)
We have decreased mirtazapine to 3.75 mg nightly prn, and have decreased sertraline back to 100mg  daily. Recheck Na today. Advised ok to use librium prn sparingly.

## 2014-06-19 LAB — OSMOLALITY, URINE: Osmolality, Ur: 358 mOsm/kg — ABNORMAL LOW (ref 390–1090)

## 2014-07-04 ENCOUNTER — Encounter: Payer: Self-pay | Admitting: Family Medicine

## 2014-07-04 ENCOUNTER — Ambulatory Visit (INDEPENDENT_AMBULATORY_CARE_PROVIDER_SITE_OTHER): Payer: BLUE CROSS/BLUE SHIELD | Admitting: Family Medicine

## 2014-07-04 VITALS — BP 160/80 | HR 68 | Temp 98.1°F | Wt 167.5 lb

## 2014-07-04 DIAGNOSIS — K589 Irritable bowel syndrome without diarrhea: Secondary | ICD-10-CM

## 2014-07-04 DIAGNOSIS — G4700 Insomnia, unspecified: Secondary | ICD-10-CM

## 2014-07-04 DIAGNOSIS — E871 Hypo-osmolality and hyponatremia: Secondary | ICD-10-CM | POA: Diagnosis not present

## 2014-07-04 DIAGNOSIS — G2581 Restless legs syndrome: Secondary | ICD-10-CM | POA: Diagnosis not present

## 2014-07-04 DIAGNOSIS — I10 Essential (primary) hypertension: Secondary | ICD-10-CM

## 2014-07-04 DIAGNOSIS — F418 Other specified anxiety disorders: Secondary | ICD-10-CM

## 2014-07-04 LAB — BASIC METABOLIC PANEL
BUN: 17 mg/dL (ref 6–23)
CALCIUM: 9.5 mg/dL (ref 8.4–10.5)
CO2: 32 mEq/L (ref 19–32)
Chloride: 94 mEq/L — ABNORMAL LOW (ref 96–112)
Creatinine, Ser: 1.11 mg/dL (ref 0.40–1.20)
GFR: 50.06 mL/min — AB (ref >60.00)
Glucose, Bld: 88 mg/dL (ref 70–99)
Potassium: 4 mEq/L (ref 3.5–5.1)
SODIUM: 129 meq/L — AB (ref 135–145)

## 2014-07-04 LAB — IBC PANEL
Iron: 60 ug/dL (ref 42–145)
Saturation Ratios: 15.1 % — ABNORMAL LOW (ref 20.0–50.0)
TRANSFERRIN: 283 mg/dL (ref 212.0–360.0)

## 2014-07-04 MED ORDER — ROPINIROLE HCL 0.25 MG PO TABS: 0.2500 mg | ORAL_TABLET | Freq: Every day | ORAL | Status: DC

## 2014-07-04 NOTE — Patient Instructions (Addendum)
Recheck labs today. May start requip nightly to help with restless legs. Start monitoring blood pressure daily at home and let me know if persistently >140/90. Continue losartan hctz daily.  Good to see you today, call us with questions. Return in 1 month for follow up visit.

## 2014-07-04 NOTE — Assessment & Plan Note (Signed)
Describes restless legs symptoms. Will check iron panel today, start trial requip 0.25mg  nightly. Pt agrees.

## 2014-07-04 NOTE — Progress Notes (Signed)
Pre visit review using our clinic review tool, if applicable. No additional management support is needed unless otherwise documented below in the visit note. 

## 2014-07-04 NOTE — Assessment & Plan Note (Signed)
bp elevated today, but pt endorses missed dose yesterday. Will monitor at home by hospice nurse over next week and call me - if persistently elevated will add amlodipine 2.5mg .

## 2014-07-04 NOTE — Assessment & Plan Note (Signed)
Pt worried anxiety playing part in malaise, briefly discussed caregiver stressors but pt feels managing this well. Discussed counselor referral, will monitor for now.

## 2014-07-04 NOTE — Assessment & Plan Note (Signed)
Overall stable on miralax daily in am. Also compliant with PPI and will try more regular librium.

## 2014-07-04 NOTE — Progress Notes (Signed)
BP 160/80 mmHg  Pulse 68  Temp(Src) 98.1 F (36.7 C) (Oral)  Wt 167 lb 8 oz (75.978 kg)   CC: persistent malaise  Subjective:    Patient ID: Brooke Beasley, female    DOB: 16-Feb-1931, 81 y.o.   MRN: [id number]  HPI: Brooke Beasley is a 81 y.o. female presenting on 07/04/2014 for Follow-up   See prior note for details. Seen twice in last month with malaise, dizziness, found to have hyponatremia due to increased sertraline and mirtazapine doses. Afterwards she had some GI sxs thought related to IBS - h/o alternating diarrhea/constipation. We stopped metamucil, continued miralax and MOM prn constipation, and recommended she start protonix and gasX. We also continued librium prn.   She continues sertraline 100mg  daily and she actually stopped mirtazapine 3.75mg  nightly. Notes night time restless sensation in legs ongoing for months.   bp elevated today - despite reported compliance with losartan hctz daily. No vision changes, CP/tightness, SOB. + headaches and persistent left leg swelling. Actually forgot to take bp meds yesterday.  Persistent anxiety and fatigue. Doesn't endorse depressed mood. Some caregiver burden but doesn't feel overwhelmed with caring for husband.   Chronic IBS with h/o intestinal bacterial overgrowth in the past as well. H/o severe diverticulosis by colonoscopy in the past.   Relevant past medical, surgical, family and social history reviewed and updated as indicated. Interim medical history since our last visit reviewed. Allergies and medications reviewed and updated. Current Outpatient Prescriptions on File Prior to Visit  Medication Sig  . acetaminophen (TYLENOL) 500 MG tablet Take 1,000 mg by mouth 2 (two) times daily.  Marland Kitchen BIOTIN PO Take by mouth daily.  . chlordiazePOXIDE (LIBRIUM) 5 MG capsule Take 1 capsule (5 mg total) by mouth 2 (two) times daily as needed for anxiety.  . Cholecalciferol (VITAMIN D PO) Take by mouth daily.  . diclofenac sodium  (VOLTAREN) 1 % GEL Apply 1 application topically 3 (three) times daily.  . fish oil-omega-3 fatty acids 1000 MG capsule Take 2 g by mouth 3 (three) times a week.  . latanoprost (XALATAN) 0.005 % ophthalmic solution Place 1 drop into both eyes at bedtime.  Marland Kitchen levothyroxine (SYNTHROID, LEVOTHROID) 75 MCG tablet Take 1 tablet (75 mcg total) by mouth daily.  Marland Kitchen losartan-hydrochlorothiazide (HYZAAR) 50-12.5 MG per tablet Take 1 tablet by mouth daily.  . Misc Natural Products (OSTEO BI-FLEX TRIPLE STRENGTH PO) Take 1 tablet by mouth daily. With Vit D  . pantoprazole (PROTONIX) 40 MG tablet Take 1 tablet (40 mg total) by mouth daily.  . polyethylene glycol powder (MIRALAX) powder Take by mouth. OTC as directed.  . Probiotic Product (PROBIOTIC DAILY PO) Take 1 capsule by mouth daily.   . sertraline (ZOLOFT) 100 MG tablet Take 1 tablet (100 mg total) by mouth daily.  . simvastatin (ZOCOR) 10 MG tablet TAKE 1 BY MOUTH DAILY  . traMADol (ULTRAM) 50 MG tablet Take 1 tablet (50 mg total) by mouth 2 (two) times daily as needed for moderate pain.   No current facility-administered medications on file prior to visit.    Review of Systems Per HPI unless specifically indicated above     Objective:    BP 160/80 mmHg  Pulse 68  Temp(Src) 98.1 F (36.7 C) (Oral)  Wt 167 lb 8 oz (75.978 kg)  Wt Readings from Last 3 Encounters:  07/04/14 167 lb 8 oz (75.978 kg)  06/18/14 164 lb 4 oz (74.503 kg)  06/13/14 162 lb 4 oz (73.596 kg)  Physical Exam  Constitutional: She appears well-developed and well-nourished. No distress.  HENT:  Mouth/Throat: Oropharynx is clear and moist. No oropharyngeal exudate.  Eyes: Conjunctivae and EOM are normal. Pupils are equal, round, and reactive to light. No scleral icterus.  Neck: Normal range of motion. Neck supple. No thyromegaly present.  Cardiovascular: Normal rate, regular rhythm and intact distal pulses.   Murmur (3/6 SEM) heard. Pulmonary/Chest: Effort normal and  breath sounds normal. No respiratory distress. She has no wheezes. She has no rales.  Abdominal: Soft. Bowel sounds are normal. She exhibits no distension and no mass. There is no tenderness. There is no rebound and no guarding.  Musculoskeletal: She exhibits no edema.  Lymphadenopathy:    She has no cervical adenopathy.  Skin: Skin is warm and dry. No rash noted.  Psychiatric: She has a normal mood and affect.  Nursing note and vitals reviewed.  Results for orders placed or performed in visit on 59/74/16  Basic metabolic panel  Result Value Ref Range   Sodium 132 (L) 135 - 145 mEq/L   Potassium 3.8 3.5 - 5.1 mEq/L   Chloride 95 (L) 96 - 112 mEq/L   CO2 33 (H) 19 - 32 mEq/L   Glucose, Bld 125 (H) 70 - 99 mg/dL   BUN 17 6 - 23 mg/dL   Creatinine, Ser 1.09 0.40 - 1.20 mg/dL   Calcium 9.5 8.4 - 10.5 mg/dL   GFR 51.13 (L) >60.00 mL/min  Osmolality, urine  Result Value Ref Range   Osmolality, Ur 358 (L) 390 - 1090 mOsm/kg  POCT Urinalysis Dipstick  Result Value Ref Range   Color, UA Yellow    Clarity, UA Clear    Glucose, UA Negative    Bilirubin, UA Negative    Ketones, UA Negative    Spec Grav, UA 1.015    Blood, UA Negative    pH, UA 6.0    Protein, UA Negative    Urobilinogen, UA 0.2    Nitrite, UA Negative    Leukocytes, UA Negative       Assessment & Plan:   Problem List Items Addressed This Visit    Restless legs - Primary    Describes restless legs symptoms. Will check iron panel today, start trial requip 0.25mg  nightly. Pt agrees.      Relevant Orders   Ferritin   IBC panel   Insomnia    Pt self stopped mirtazapine. ?RLS related - trial requip      IBS    Overall stable on miralax daily in am. Also compliant with PPI and will try more regular librium.      Hyponatremia    Recheck Na today - thought SSRI related. hctz was also recently started. If persistently low sodium, consider stopping hctz.      Relevant Orders   Basic metabolic panel    HYPERTENSION, BENIGN    bp elevated today, but pt endorses missed dose yesterday. Will monitor at home by hospice nurse over next week and call me - if persistently elevated will add amlodipine 2.5mg .       Anxiety associated with depression    Pt worried anxiety playing part in malaise, briefly discussed caregiver stressors but pt feels managing this well. Discussed counselor referral, will monitor for now.          Follow up plan: Return in about 4 weeks (around 08/01/2014), or if symptoms worsen or fail to improve, for follow up visit.

## 2014-07-04 NOTE — Assessment & Plan Note (Signed)
Pt self stopped mirtazapine. ?RLS related - trial requip

## 2014-07-04 NOTE — Assessment & Plan Note (Signed)
Recheck Na today - thought SSRI related. hctz was also recently started. If persistently low sodium, consider stopping hctz.

## 2014-07-05 LAB — FERRITIN: FERRITIN: 69.3 ng/mL (ref 10.0–291.0)

## 2014-07-07 ENCOUNTER — Other Ambulatory Visit: Payer: Self-pay | Admitting: Family Medicine

## 2014-07-07 DIAGNOSIS — E871 Hypo-osmolality and hyponatremia: Secondary | ICD-10-CM

## 2014-07-07 MED ORDER — LOSARTAN POTASSIUM 50 MG PO TABS: 75.0000 mg | ORAL_TABLET | Freq: Every day | ORAL | Status: DC

## 2014-07-12 ENCOUNTER — Other Ambulatory Visit: Payer: Self-pay | Admitting: Family Medicine

## 2014-07-12 MED ORDER — CLONAZEPAM 0.5 MG PO TABS: 0.5000 mg | ORAL_TABLET | Freq: Every day | ORAL | Status: DC | PRN

## 2014-07-15 ENCOUNTER — Other Ambulatory Visit (INDEPENDENT_AMBULATORY_CARE_PROVIDER_SITE_OTHER): Payer: BLUE CROSS/BLUE SHIELD

## 2014-07-15 DIAGNOSIS — E871 Hypo-osmolality and hyponatremia: Secondary | ICD-10-CM

## 2014-07-15 LAB — BASIC METABOLIC PANEL
BUN: 24 mg/dL — AB (ref 6–23)
CO2: 27 mEq/L (ref 19–32)
CREATININE: 1.2 mg/dL (ref 0.40–1.20)
Calcium: 9.5 mg/dL (ref 8.4–10.5)
Chloride: 99 mEq/L (ref 96–112)
GFR: 45.75 mL/min — ABNORMAL LOW (ref >60.00)
Glucose, Bld: 88 mg/dL (ref 70–99)
POTASSIUM: 4.5 meq/L (ref 3.5–5.1)
Sodium: 132 mEq/L — ABNORMAL LOW (ref 135–145)

## 2014-07-23 ENCOUNTER — Telehealth: Payer: Self-pay

## 2014-07-23 NOTE — Telephone Encounter (Signed)
Pt called to get recent lab results;pt advised per mychart note; pt said she is tired all the time and gets SOB upon any exertion; pt said has discussed with Dr Darnell Level before. Pt wants to know if any of her meds might be damaging her kidneys. Pt has appt on 08/01/14. Pt request cb to see if needs to do anything different before appt.

## 2014-07-23 NOTE — Telephone Encounter (Signed)
We've made med changes to help kidneys. Make sure staying well hydrated. How is blood pressure running with recent changes?

## 2014-07-24 NOTE — Telephone Encounter (Signed)
Patient notified. BP's have been 144/69; 140/62;144/64;142/72;177/84. She is concerned about her fatigue. She will discuss it with you next week and call sooner if it gets worse.

## 2014-08-01 ENCOUNTER — Ambulatory Visit: Payer: BLUE CROSS/BLUE SHIELD | Admitting: Family Medicine

## 2014-08-02 ENCOUNTER — Encounter: Payer: Self-pay | Admitting: Family Medicine

## 2014-08-02 ENCOUNTER — Ambulatory Visit (INDEPENDENT_AMBULATORY_CARE_PROVIDER_SITE_OTHER): Payer: Medicare Other | Admitting: Family Medicine

## 2014-08-02 VITALS — BP 134/82 | HR 56 | Temp 97.6°F | Wt 166.5 lb

## 2014-08-02 DIAGNOSIS — N183 Chronic kidney disease, stage 3 unspecified: Secondary | ICD-10-CM | POA: Insufficient documentation

## 2014-08-02 DIAGNOSIS — K589 Irritable bowel syndrome without diarrhea: Secondary | ICD-10-CM

## 2014-08-02 DIAGNOSIS — N1832 Chronic kidney disease, stage 3b: Secondary | ICD-10-CM | POA: Insufficient documentation

## 2014-08-02 DIAGNOSIS — G2581 Restless legs syndrome: Secondary | ICD-10-CM | POA: Diagnosis not present

## 2014-08-02 DIAGNOSIS — E871 Hypo-osmolality and hyponatremia: Secondary | ICD-10-CM

## 2014-08-02 DIAGNOSIS — I10 Essential (primary) hypertension: Secondary | ICD-10-CM

## 2014-08-02 DIAGNOSIS — N1831 Chronic kidney disease, stage 3a: Secondary | ICD-10-CM | POA: Insufficient documentation

## 2014-08-02 DIAGNOSIS — E785 Hyperlipidemia, unspecified: Secondary | ICD-10-CM

## 2014-08-02 DIAGNOSIS — G4700 Insomnia, unspecified: Secondary | ICD-10-CM | POA: Diagnosis not present

## 2014-08-02 HISTORY — DX: Chronic kidney disease, stage 3 unspecified: N18.30

## 2014-08-02 MED ORDER — CLONAZEPAM 0.5 MG PO TABS: 0.5000 mg | ORAL_TABLET | Freq: Every day | ORAL | Status: DC

## 2014-08-02 MED ORDER — PRAVASTATIN SODIUM 20 MG PO TABS
20.0000 mg | ORAL_TABLET | Freq: Every day | ORAL | Status: DC
Start: 2014-08-02 — End: 2014-12-02

## 2014-08-02 MED ORDER — LOSARTAN POTASSIUM 100 MG PO TABS: 100.0000 mg | ORAL_TABLET | Freq: Every day | ORAL | Status: DC

## 2014-08-02 NOTE — Patient Instructions (Addendum)
Let's increase losartan to 100mg  daily. Take 2 tablets of 50mg  until you run out then new dose of 100mg  will be at pharmacy. Return next Friday for kidney recheck (lab visit only). Continue klonopin 0.5mg  at bedtime. Try pravastatin 20mg  daily instead of simvastatin 10mg  daily.

## 2014-08-02 NOTE — Assessment & Plan Note (Signed)
Recheck next week when returns for fasting labs.

## 2014-08-02 NOTE — Assessment & Plan Note (Signed)
Given CKD bp ideal goal <130/80. Will increase losartan to 100mg  daily. Now off diuretic.

## 2014-08-02 NOTE — Assessment & Plan Note (Signed)
Reviewing chart, this dates back to at least 2013. Discussed with patient as well as importance of good hydration status.

## 2014-08-02 NOTE — Assessment & Plan Note (Signed)
More stable on klonopin and off librium. Continue current regimen.

## 2014-08-02 NOTE — Progress Notes (Signed)
Pre visit review using our clinic review tool, if applicable. No additional management support is needed unless otherwise documented below in the visit note. 

## 2014-08-02 NOTE — Progress Notes (Addendum)
BP 134/82 mmHg  Pulse 56  Temp(Src) 97.6 F (36.4 C) (Oral)  Wt 166 lb 8 oz (75.524 kg)   CC: f/u visit  Subjective:    Patient ID: Brooke Beasley, female    DOB: Mar 20, 1931, 81 y.o.   MRN: [id number]  HPI: Brooke Beasley is a 81 y.o. female presenting on 08/02/2014 for Follow-up   BP with elevated readings at home recently. Taking losartan 75mg  daily.   IBS - started klonopin last visit to help with this and stopped librium. Also on miralax daily, PPI, and gas-X. Last visit we started requip for presumed RLS. Finds klonopin has helped better than requip for RLS. Overall better at night time.   Hyponatremia - mild noted over last few months. Voiding well.   Relevant past medical, surgical, family and social history reviewed and updated as indicated. Interim medical history since our last visit reviewed.  Allergies and medications reviewed and updated. Current Outpatient Prescriptions on File Prior to Visit  Medication Sig  . acetaminophen (TYLENOL) 500 MG tablet Take 1,000 mg by mouth 2 (two) times daily.  Marland Kitchen BIOTIN PO Take by mouth daily.  . Cholecalciferol (VITAMIN D PO) Take by mouth daily.  . diclofenac sodium (VOLTAREN) 1 % GEL Apply 1 application topically 3 (three) times daily.  . fish oil-omega-3 fatty acids 1000 MG capsule Take 2 g by mouth 3 (three) times a week.  . latanoprost (XALATAN) 0.005 % ophthalmic solution Place 1 drop into both eyes at bedtime.  Marland Kitchen levothyroxine (SYNTHROID, LEVOTHROID) 75 MCG tablet Take 1 tablet (75 mcg total) by mouth daily.  . Misc Natural Products (OSTEO BI-FLEX TRIPLE STRENGTH PO) Take 1 tablet by mouth daily. With Vit D  . pantoprazole (PROTONIX) 40 MG tablet Take 1 tablet (40 mg total) by mouth daily.  . polyethylene glycol powder (MIRALAX) powder Take by mouth. OTC as directed.  . Probiotic Product (PROBIOTIC DAILY PO) Take 1 capsule by mouth daily.   . sertraline (ZOLOFT) 100 MG tablet Take 1 tablet (100 mg total) by mouth daily.  .  traMADol (ULTRAM) 50 MG tablet Take 1 tablet (50 mg total) by mouth 2 (two) times daily as needed for moderate pain.   No current facility-administered medications on file prior to visit.    Review of Systems Per HPI unless specifically indicated above     Objective:    BP 134/82 mmHg  Pulse 56  Temp(Src) 97.6 F (36.4 C) (Oral)  Wt 166 lb 8 oz (75.524 kg)  Wt Readings from Last 3 Encounters:  08/02/14 166 lb 8 oz (75.524 kg)  07/04/14 167 lb 8 oz (75.978 kg)  06/18/14 164 lb 4 oz (74.503 kg)    Physical Exam  Constitutional: She appears well-developed and well-nourished. No distress.  HENT:  Head: Normocephalic and atraumatic.  Mouth/Throat: Oropharynx is clear and moist. No oropharyngeal exudate.  Neck: Normal range of motion. Neck supple. No thyromegaly present.  Cardiovascular: Normal rate, regular rhythm, normal heart sounds and intact distal pulses.   No murmur heard. Pulmonary/Chest: Effort normal and breath sounds normal. No respiratory distress. She has no wheezes. She has no rales.  Musculoskeletal: She exhibits edema (mild nonpitting edema).  Lymphadenopathy:    She has no cervical adenopathy.  Skin: Skin is warm and dry. No rash noted.  Psychiatric: She has a normal mood and affect.  Nursing note and vitals reviewed.  Results for orders placed or performed in visit on 82/50/53  Basic metabolic panel  Result  Value Ref Range   Sodium 132 (L) 135 - 145 mEq/L   Potassium 4.5 3.5 - 5.1 mEq/L   Chloride 99 96 - 112 mEq/L   CO2 27 19 - 32 mEq/L   Glucose, Bld 88 70 - 99 mg/dL   BUN 24 (H) 6 - 23 mg/dL   Creatinine, Ser 1.20 0.40 - 1.20 mg/dL   Calcium 9.5 8.4 - 10.5 mg/dL   GFR 45.75 (L) >60.00 mL/min   Lab Results  Component Value Date   TSH 3.55 06/13/2014      Assessment & Plan:   Problem List Items Addressed This Visit    Restless legs    Did not respond to low dose requip - but improving on klonopin. Continue       Insomnia    Improving on  klonopin 0.5mg  nightly - continue.      IBS - Primary    More stable on klonopin and off librium. Continue current regimen.      Hyponatremia    Recheck next week when returns for fasting labs.      HYPERTENSION, BENIGN    Given CKD bp ideal goal <130/80. Will increase losartan to 100mg  daily. Now off diuretic.      Relevant Medications   losartan (COZAAR) tablet   pravastatin (PRAVACHOL) tablet   HLD (hyperlipidemia)    Worried about simvastatin 10mg  causing some myalgias and arthralgias and fatigue. Will change to less potent pravastatin 20mg  daily. We have room for change based on latest FLP results.      Relevant Medications   losartan (COZAAR) tablet   pravastatin (PRAVACHOL) tablet   CKD (chronic kidney disease) stage 3, GFR 30-59 ml/min    Reviewing chart, this dates back to at least 2013. Discussed with patient as well as importance of good hydration status.          Follow up plan: Return if symptoms worsen or fail to improve.

## 2014-08-02 NOTE — Assessment & Plan Note (Signed)
Did not respond to low dose requip - but improving on klonopin. Continue

## 2014-08-02 NOTE — Assessment & Plan Note (Signed)
Improving on klonopin 0.5mg  nightly - continue.

## 2014-08-03 NOTE — Assessment & Plan Note (Signed)
Worried about simvastatin 10mg  causing some myalgias and arthralgias and fatigue. Will change to less potent pravastatin 20mg  daily. We have room for change based on latest FLP results.

## 2014-08-09 ENCOUNTER — Other Ambulatory Visit (INDEPENDENT_AMBULATORY_CARE_PROVIDER_SITE_OTHER): Payer: Medicare Other

## 2014-08-09 DIAGNOSIS — E871 Hypo-osmolality and hyponatremia: Secondary | ICD-10-CM | POA: Diagnosis not present

## 2014-08-09 LAB — BASIC METABOLIC PANEL
BUN: 19 mg/dL (ref 6–23)
CHLORIDE: 97 meq/L (ref 96–112)
CO2: 30 mEq/L (ref 19–32)
Calcium: 9.4 mg/dL (ref 8.4–10.5)
Creatinine, Ser: 1.33 mg/dL — ABNORMAL HIGH (ref 0.40–1.20)
GFR: 40.62 mL/min — ABNORMAL LOW (ref >60.00)
Glucose, Bld: 95 mg/dL (ref 70–99)
POTASSIUM: 4.3 meq/L (ref 3.5–5.1)
SODIUM: 131 meq/L — AB (ref 135–145)

## 2014-08-12 ENCOUNTER — Telehealth: Payer: Self-pay | Admitting: *Deleted

## 2014-08-12 NOTE — Telephone Encounter (Signed)
PA required for clonazepam. Form in your IN box for completion.

## 2014-08-13 ENCOUNTER — Other Ambulatory Visit: Payer: Self-pay | Admitting: Family Medicine

## 2014-08-13 ENCOUNTER — Encounter: Payer: Self-pay | Admitting: Family Medicine

## 2014-08-13 DIAGNOSIS — N183 Chronic kidney disease, stage 3 unspecified: Secondary | ICD-10-CM

## 2014-08-13 MED ORDER — AMLODIPINE BESYLATE 5 MG PO TABS: 5.0000 mg | ORAL_TABLET | Freq: Every day | ORAL | Status: DC

## 2014-08-13 MED ORDER — LOSARTAN POTASSIUM 50 MG PO TABS: 50.0000 mg | ORAL_TABLET | Freq: Every day | ORAL | Status: DC

## 2014-08-13 NOTE — Telephone Encounter (Signed)
Filled an in Kim's box.

## 2014-08-13 NOTE — Telephone Encounter (Signed)
Form faxed. Will await determination. 

## 2014-08-21 ENCOUNTER — Other Ambulatory Visit: Payer: Self-pay | Admitting: *Deleted

## 2014-08-21 MED ORDER — LEVOTHYROXINE SODIUM 75 MCG PO TABS: 75.0000 ug | ORAL_TABLET | Freq: Every day | ORAL | Status: DC

## 2014-08-21 NOTE — Telephone Encounter (Signed)
Clonazepam approved.

## 2014-09-12 ENCOUNTER — Other Ambulatory Visit: Payer: Medicare Other

## 2014-09-19 ENCOUNTER — Other Ambulatory Visit: Payer: Self-pay | Admitting: Family Medicine

## 2014-09-19 ENCOUNTER — Other Ambulatory Visit (INDEPENDENT_AMBULATORY_CARE_PROVIDER_SITE_OTHER): Payer: Medicare Other

## 2014-09-19 DIAGNOSIS — N183 Chronic kidney disease, stage 3 unspecified: Secondary | ICD-10-CM

## 2014-09-19 LAB — RENAL FUNCTION PANEL
ALBUMIN: 4.2 g/dL (ref 3.5–5.2)
BUN: 19 mg/dL (ref 6–23)
CO2: 28 meq/L (ref 19–32)
Calcium: 9.5 mg/dL (ref 8.4–10.5)
Chloride: 100 mEq/L (ref 96–112)
Creatinine, Ser: 1.15 mg/dL (ref 0.40–1.20)
GFR: 48.03 mL/min — AB (ref >60.00)
GLUCOSE: 107 mg/dL — AB (ref 70–99)
POTASSIUM: 4.6 meq/L (ref 3.5–5.1)
Phosphorus: 3.9 mg/dL (ref 2.3–4.6)
SODIUM: 134 meq/L — AB (ref 135–145)

## 2014-09-19 LAB — LDL CHOLESTEROL, DIRECT: Direct LDL: 86 mg/dL

## 2014-09-24 LAB — PROTEIN ELECTROPHORESIS, SERUM, WITH REFLEX
ALBUMIN ELP: 4 g/dL (ref 3.8–4.8)
ALPHA-1-GLOBULIN: 0.3 g/dL (ref 0.2–0.3)
ALPHA-2-GLOBULIN: 0.7 g/dL (ref 0.5–0.9)
BETA 2: 0.3 g/dL (ref 0.2–0.5)
BETA GLOBULIN: 0.5 g/dL (ref 0.4–0.6)
GAMMA GLOBULIN: 0.9 g/dL (ref 0.8–1.7)
Total Protein, Serum Electrophoresis: 6.7 g/dL (ref 6.1–8.1)

## 2014-09-26 ENCOUNTER — Other Ambulatory Visit: Payer: Self-pay | Admitting: Family Medicine

## 2014-09-26 ENCOUNTER — Encounter: Payer: Self-pay | Admitting: *Deleted

## 2014-09-26 ENCOUNTER — Encounter: Payer: Self-pay | Admitting: Family Medicine

## 2014-09-26 ENCOUNTER — Ambulatory Visit (INDEPENDENT_AMBULATORY_CARE_PROVIDER_SITE_OTHER): Payer: Medicare Other | Admitting: Family Medicine

## 2014-09-26 VITALS — BP 130/70 | HR 60 | Temp 98.1°F | Ht 62.0 in | Wt 161.5 lb

## 2014-09-26 DIAGNOSIS — R0989 Other specified symptoms and signs involving the circulatory and respiratory systems: Secondary | ICD-10-CM | POA: Diagnosis not present

## 2014-09-26 DIAGNOSIS — N183 Chronic kidney disease, stage 3 unspecified: Secondary | ICD-10-CM

## 2014-09-26 DIAGNOSIS — M810 Age-related osteoporosis without current pathological fracture: Secondary | ICD-10-CM

## 2014-09-26 DIAGNOSIS — H409 Unspecified glaucoma: Secondary | ICD-10-CM

## 2014-09-26 DIAGNOSIS — Z0000 Encounter for general adult medical examination without abnormal findings: Secondary | ICD-10-CM | POA: Diagnosis not present

## 2014-09-26 DIAGNOSIS — F418 Other specified anxiety disorders: Secondary | ICD-10-CM

## 2014-09-26 DIAGNOSIS — I10 Essential (primary) hypertension: Secondary | ICD-10-CM

## 2014-09-26 DIAGNOSIS — K5730 Diverticulosis of large intestine without perforation or abscess without bleeding: Secondary | ICD-10-CM | POA: Insufficient documentation

## 2014-09-26 DIAGNOSIS — M1990 Unspecified osteoarthritis, unspecified site: Secondary | ICD-10-CM

## 2014-09-26 DIAGNOSIS — E039 Hypothyroidism, unspecified: Secondary | ICD-10-CM

## 2014-09-26 DIAGNOSIS — K581 Irritable bowel syndrome with constipation: Secondary | ICD-10-CM

## 2014-09-26 DIAGNOSIS — Z7189 Other specified counseling: Secondary | ICD-10-CM | POA: Insufficient documentation

## 2014-09-26 DIAGNOSIS — R7303 Prediabetes: Secondary | ICD-10-CM

## 2014-09-26 DIAGNOSIS — G4700 Insomnia, unspecified: Secondary | ICD-10-CM

## 2014-09-26 DIAGNOSIS — E785 Hyperlipidemia, unspecified: Secondary | ICD-10-CM

## 2014-09-26 NOTE — Progress Notes (Signed)
BP 130/70 mmHg  Pulse 60  Temp(Src) 98.1 F (36.7 C) (Oral)  Ht 5\' 2"  (1.575 m)  Wt 161 lb 8 oz (73.256 kg)  BMI 29.53 kg/m2   CC: medicare wellness visit  Subjective:    Patient ID: Brooke Beasley, female    DOB: 1930/05/02, 81 y.o.   MRN: [id number]  HPI: Brooke Beasley is a 81 y.o. female presenting on 09/26/2014 for Annual Exam   L shoulder/arm pain ongoing. Tramadol not very helpful for pain. Took 2 aleve which helped. Denies inciting trauma. Some unsteadiness noted.   IBS - stable on probiotic and gas x.  Trouble sleeping - wakes up to void at night > day.  Hearing aides used. Vision screen at eye doctor's office yearly. H/o glaucoma No falls in last year. + depression under treatment.PHQ2 = 1 today.Main concern is anxiety.  Preventative: Colonoscopy 2004 - severe sigmoid diverticulosis. No recent blood in stool. Taking probiotics for IBS. DEXA - 2011 osteoporosis. Pt does not take calcium but does take vit D. Declines rpt DEXA. Fosamax caused bone pain. Well woman - last done about 12 yrs ago. Mammogram 03/2014 - WNL Flu shot - declines Pneumovax - 2004. prevnar 05/2013 Td 2010 zostavax 2012 Advanced directives - discussed. HC proxy is daughters Brooke Beasley and other. Has living will at home. Asked to bring in  Lives and cares for husband with dementia. 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 Interim medical history since our last visit reviewed. Allergies and medications reviewed and updated. Current Outpatient Prescriptions on File Prior to Visit  Medication Sig  . acetaminophen (TYLENOL) 500 MG tablet Take 1,000 mg by mouth 2 (two) times daily.  Marland Kitchen amLODipine (NORVASC) 5 MG tablet Take 1 tablet (5 mg total) by mouth daily.  . clonazePAM (KLONOPIN) 0.5 MG  tablet Take 1 tablet (0.5 mg total) by mouth at bedtime.  . diclofenac sodium (VOLTAREN) 1 % GEL Apply 1 application topically 3 (three) times daily.  Marland Kitchen levothyroxine (SYNTHROID, LEVOTHROID) 75 MCG tablet Take 1 tablet (75 mcg total) by mouth daily.  . Misc Natural Products (OSTEO BI-FLEX TRIPLE STRENGTH PO) Take 1 tablet by mouth daily. With Vit D  . pantoprazole (PROTONIX) 40 MG tablet Take 1 tablet (40 mg total) by mouth daily.  . pravastatin (PRAVACHOL) 20 MG tablet Take 1 tablet (20 mg total) by mouth daily.  . Probiotic Product (PROBIOTIC DAILY PO) Take 1 capsule by mouth daily.   . sertraline (ZOLOFT) 100 MG tablet Take 1 tablet (100 mg total) by mouth daily.  Marland Kitchen BIOTIN PO Take by mouth daily.  . Cholecalciferol (VITAMIN D PO) Take by mouth daily.  . fish oil-omega-3 fatty acids 1000 MG capsule Take 2 g by mouth 3 (three) times a week.  . latanoprost (XALATAN) 0.005 % ophthalmic solution Place 1 drop into both eyes at bedtime.  . polyethylene glycol powder (MIRALAX) powder Take by mouth. OTC as directed.  . traMADol (ULTRAM) 50 MG tablet Take 1 tablet (50 mg total) by mouth 2 (two) times daily as needed for moderate pain. (Patient not taking: Reported on 09/26/2014)   No current facility-administered medications on file prior to visit.    Review of Systems  Constitutional: Negative for fever, chills, activity change, appetite change, fatigue and unexpected weight change.  HENT: Negative for hearing loss.  Eyes: Negative for visual disturbance.  Respiratory: Positive for shortness of breath (mild, attiributes to deconditioning). Negative for cough, chest tightness and wheezing.   Cardiovascular: Negative for chest pain, palpitations and leg swelling.  Gastrointestinal: Positive for abdominal pain (occasional, improved on probiotic, known ibs). Negative for nausea, vomiting, diarrhea, constipation, blood in stool and abdominal distention.  Genitourinary: Negative for hematuria and  difficulty urinating.  Musculoskeletal: Negative for myalgias, arthralgias and neck pain.  Skin: Negative for rash.  Neurological: Negative for dizziness, seizures, syncope and headaches.  Hematological: Negative for adenopathy. Bruises/bleeds easily.  Psychiatric/Behavioral: Positive for dysphoric mood. The patient is nervous/anxious.    Per HPI unless specifically indicated above     Objective:    BP 130/70 mmHg  Pulse 60  Temp(Src) 98.1 F (36.7 C) (Oral)  Ht 5\' 2"  (1.575 m)  Wt 161 lb 8 oz (73.256 kg)  BMI 29.53 kg/m2  Wt Readings from Last 3 Encounters:  09/26/14 161 lb 8 oz (73.256 kg)  08/02/14 166 lb 8 oz (75.524 kg)  07/04/14 167 lb 8 oz (75.978 kg)    Physical Exam  Constitutional: She is oriented to person, place, and time. She appears well-developed and well-nourished. No distress.  HENT:  Head: Normocephalic and atraumatic.  Right Ear: Hearing, tympanic membrane, external ear and ear canal normal.  Left Ear: Hearing, tympanic membrane, external ear and ear canal normal.  Nose: Nose normal.  Mouth/Throat: Uvula is midline, oropharynx is clear and moist and mucous membranes are normal. No oropharyngeal exudate, posterior oropharyngeal edema or posterior oropharyngeal erythema.  Eyes: Conjunctivae and EOM are normal. Pupils are equal, round, and reactive to light. No scleral icterus.  Neck: Normal range of motion. Neck supple. Carotid bruit is present (R>L mild bruit). No thyromegaly present.  Cardiovascular: Normal rate, regular rhythm and intact distal pulses.   Murmur (2/6 SEM best at LUSB) heard. Pulses:      Radial pulses are 2+ on the right side, and 2+ on the left side.  Pulmonary/Chest: Effort normal and breath sounds normal. No respiratory distress. She has no wheezes. She has no rales.  Abdominal: Soft. Bowel sounds are normal. She exhibits no distension and no mass. There is no tenderness. There is no rebound and no guarding.  Musculoskeletal: Normal range  of motion. She exhibits no edema.  Lymphadenopathy:    She has no cervical adenopathy.  Neurological: She is alert and oriented to person, place, and time.  CN grossly intact, station and gait intact Recall 3/3 Calculation 5/5 serial 3s  Skin: Skin is warm and dry. No rash noted.  Psychiatric: She has a normal mood and affect. Her behavior is normal. Judgment and thought content normal.  Nursing note and vitals reviewed.  Results for orders placed or performed in visit on 09/19/14  Renal function panel  Result Value Ref Range   Sodium 134 (L) 135 - 145 mEq/L   Potassium 4.6 3.5 - 5.1 mEq/L   Chloride 100 96 - 112 mEq/L   CO2 28 19 - 32 mEq/L   Calcium 9.5 8.4 - 10.5 mg/dL   Albumin 4.2 3.5 - 5.2 g/dL   BUN 19 6 - 23 mg/dL   Creatinine, Ser 1.15 0.40 - 1.20 mg/dL   Glucose, Bld 107 (H) 70 - 99 mg/dL   Phosphorus 3.9 2.3 - 4.6 mg/dL   GFR 48.03 (L) >60.00 mL/min  LDL Cholesterol, Direct  Result Value Ref Range   Direct LDL 86.0 mg/dL  Serum protein electrophoresis with reflex  Result Value Ref Range   Total Protein, Serum Electrophoresis 6.7 6.1 - 8.1 g/dL   Albumin ELP 4.0 3.8 - 4.8 g/dL   Alpha-1-Globulin 0.3 0.2 - 0.3 g/dL   Alpha-2-Globulin 0.7 0.5 - 0.9 g/dL   Beta Globulin 0.5 0.4 - 0.6 g/dL   Beta 2 0.3 0.2 - 0.5 g/dL   Gamma Globulin 0.9 0.8 - 1.7 g/dL   Abnormal Protein Band1 NOT DET g/dL   SPE Interp. SEE NOTE    COMMENT (PROTEIN ELECTROPHOR) SEE NOTE    Abnormal Protein Band2 NOT DET g/dL   Abnormal Protein Band3 NOT DET g/dL   Lab Results  Component Value Date   TSH 3.55 06/13/2014      Assessment & Plan:   Problem List Items Addressed This Visit    Advanced care planning/counseling discussion    Advanced directives - discussed. HC proxy is daughters Brooke Beasley and other. Has living will at home. Asked to bring in      Anxiety associated with depression    Stable on sertraline 100mg  daily.      CKD (chronic kidney disease) stage 3, GFR 30-59 ml/min      Reviewed with patient dx and encouraged good hydration and avoidance of NSAIDs.      Diverticulosis of colon without hemorrhage   Glaucoma    Q6 mo eye exams.      HLD (hyperlipidemia)    LDL stable. Continue pravastatin 20mg  daily - tolerating well.      HYPERTENSION, BENIGN    bp stable on current regimen. Continue. Off losartan 2/2 worsening kidney function.      Hypothyroidism    Chronic, stable as of 05/2014 TSH.      Insomnia    Continue klonopin 0.5mg  nightly which has also helped with anxiety , but persistent trouble sleeping + nocturia. Pt declines antimuscarinic today. Continue to monitor.      Irritable bowel syndrome with constipation    Improved with probiotic and gas x daily.      Medicare annual wellness visit, subsequent - Primary    I have personally reviewed the Medicare Annual Wellness questionnaire and have noted 1. The patient's medical and social history 2. Their use of alcohol, tobacco or illicit drugs 3. Their current medications and supplements 4. The patient's functional ability including ADL's, fall risks, home safety risks and hearing or visual impairment. 5. Diet and physical activity 6. Evidence for depression or mood disorders The patients weight, height, BMI have been recorded in the chart.  Hearing and vision has been addressed. I have made referrals, counseling and provided education to the patient based review of the above and I have provided the pt with a written personalized care plan for preventive services. Provider list updated - see scanned questionairre. Reviewed preventative protocols and updated unless pt declined.       Osteoarthritis    Known osteoarthritis of shoulders and other body parts.      Osteoporosis    Not regular with Vit D - encouraged continued vit D daily.      Prediabetes    Lab Results  Component Value Date   HGBA1C 6.1 06/07/2013  continue to watch sugar in diet.      Preventative health care     Preventative protocols reviewed and updated unless pt declined. Discussed healthy diet and lifestyle.        Other Visit Diagnoses    Bilateral carotid bruits        Relevant Orders  Carotid        Follow up plan: Return in about 6 months (around 03/28/2015), or as needed, for follow up visit.

## 2014-09-26 NOTE — Assessment & Plan Note (Signed)
Stable on sertraline 100mg  daily.

## 2014-09-26 NOTE — Assessment & Plan Note (Signed)
Preventative protocols reviewed and updated unless pt declined. Discussed healthy diet and lifestyle.  

## 2014-09-26 NOTE — Assessment & Plan Note (Signed)
Advanced directives - discussed. HC proxy is daughters [name] and other. Has living will at home. Asked to bring in

## 2014-09-26 NOTE — Assessment & Plan Note (Signed)
Reviewed with patient dx and encouraged good hydration and avoidance of NSAIDs.

## 2014-09-26 NOTE — Assessment & Plan Note (Signed)
Not regular with Vit D - encouraged continued vit D daily.

## 2014-09-26 NOTE — Progress Notes (Signed)
Pre visit review using our clinic review tool, if applicable. No additional management support is needed unless otherwise documented below in the visit note. 

## 2014-09-26 NOTE — Patient Instructions (Addendum)
Bring me copy of advanced directive at your convenience. We will schedule carotid ultrasound to check for neck artery blockages. Good to see you today, call us with questions. Return as needed or in 6 months for follow up visit. Restart vitamin D regularly.

## 2014-09-26 NOTE — Assessment & Plan Note (Addendum)
Lab Results  Component Value Date   HGBA1C 6.1 06/07/2013  continue to watch sugar in diet.

## 2014-09-26 NOTE — Assessment & Plan Note (Signed)
Improved with probiotic and gas x daily.

## 2014-09-26 NOTE — Assessment & Plan Note (Signed)
Continue klonopin 0.5mg  nightly which has also helped with anxiety , but persistent trouble sleeping + nocturia. Pt declines antimuscarinic today. Continue to monitor.

## 2014-09-26 NOTE — Assessment & Plan Note (Addendum)
bp stable on current regimen. Continue. Off losartan 2/2 worsening kidney function.

## 2014-09-26 NOTE — Assessment & Plan Note (Signed)

## 2014-09-26 NOTE — Assessment & Plan Note (Signed)
LDL stable. Continue pravastatin 20mg  daily - tolerating well.

## 2014-09-26 NOTE — Assessment & Plan Note (Signed)
Chronic, stable as of 05/2014 TSH.

## 2014-09-26 NOTE — Assessment & Plan Note (Signed)
Q6 mo eye exams.

## 2014-09-26 NOTE — Assessment & Plan Note (Signed)
Known osteoarthritis of shoulders and other body parts.

## 2014-10-06 ENCOUNTER — Other Ambulatory Visit: Payer: Self-pay | Admitting: Family Medicine

## 2014-10-07 NOTE — Telephone Encounter (Signed)
plz phone in. 

## 2014-10-07 NOTE — Telephone Encounter (Signed)
Rx called in as directed.   

## 2014-10-10 ENCOUNTER — Encounter: Payer: Self-pay | Admitting: Family Medicine

## 2014-10-17 ENCOUNTER — Ambulatory Visit (INDEPENDENT_AMBULATORY_CARE_PROVIDER_SITE_OTHER): Payer: Medicare Other

## 2014-10-17 DIAGNOSIS — R0989 Other specified symptoms and signs involving the circulatory and respiratory systems: Secondary | ICD-10-CM

## 2014-10-18 ENCOUNTER — Encounter: Payer: Self-pay | Admitting: Family Medicine

## 2014-10-18 DIAGNOSIS — I6529 Occlusion and stenosis of unspecified carotid artery: Secondary | ICD-10-CM

## 2014-10-18 HISTORY — DX: Occlusion and stenosis of unspecified carotid artery: I65.29

## 2014-10-28 ENCOUNTER — Other Ambulatory Visit: Payer: Self-pay | Admitting: Family Medicine

## 2014-10-29 ENCOUNTER — Telehealth: Payer: Self-pay

## 2014-10-29 NOTE — Telephone Encounter (Signed)
Pt left v/m requesting referral to bone specialist; pt last seen 09/26/14; arm hurting worse upon movement and difficulty in raising arm up over pts head. Pt request cb.

## 2014-10-30 NOTE — Telephone Encounter (Signed)
Pt called again and would like to be referred, not to a bone specialist, but to Rheumatology, Precious Reel, at [facility].  Pt states that she has painful arthritis in her shoulder. / lt

## 2014-10-30 NOTE — Telephone Encounter (Signed)
Has she seen orthopedist? Known arthritis of shoulders.  Would start by ortho referral for possible steroid injection vs come into office to discuss possible steroid injection in office.

## 2014-10-31 NOTE — Telephone Encounter (Signed)
Message left for patient to return my call.  

## 2014-10-31 NOTE — Telephone Encounter (Signed)
Spoke with patient and scheduled appt with you for 11/04/14.

## 2014-11-04 ENCOUNTER — Encounter: Payer: Self-pay | Admitting: Family Medicine

## 2014-11-04 ENCOUNTER — Ambulatory Visit (INDEPENDENT_AMBULATORY_CARE_PROVIDER_SITE_OTHER): Payer: Medicare Other | Admitting: Family Medicine

## 2014-11-04 ENCOUNTER — Ambulatory Visit (INDEPENDENT_AMBULATORY_CARE_PROVIDER_SITE_OTHER)
Admission: RE | Admit: 2014-11-04 | Discharge: 2014-11-04 | Disposition: A | Discharge status: Home / Self Care | Payer: Medicare Other | Source: Ambulatory Visit | Attending: Family Medicine | Admitting: Family Medicine

## 2014-11-04 VITALS — BP 122/64 | HR 64 | Temp 97.6°F | Wt 158.5 lb

## 2014-11-04 DIAGNOSIS — M25512 Pain in left shoulder: Secondary | ICD-10-CM

## 2014-11-04 HISTORY — DX: Pain in left shoulder: M25.512

## 2014-11-04 MED ORDER — METHYLPREDNISOLONE ACETATE 40 MG/ML IJ SUSP
40.0000 mg | Freq: Once | INTRAMUSCULAR | Status: AC
  Administered 2014-11-04: 40 mg via INTRA_ARTICULAR

## 2014-11-04 NOTE — Progress Notes (Signed)
BP 122/64 mmHg  Pulse 64  Temp(Src) 97.6 F (36.4 C) (Oral)  Wt 158 lb 8 oz (71.895 kg)   CC: L shoulder pain  Subjective:    Patient ID: Brooke Beasley, female    DOB: Jul 09, 1930, 81 y.o.   MRN: [id number]  HPI: IFEOMA Beasley is a 81 y.o. female presenting on 11/04/2014 for Shoulder Pain   Left handed.   Ongoing L shoulder pain since last month. Painful to move L shoulder. Unable to even comb hair. Painful posterior shoulder. Denies inciting trauma/injury. Some neck pain but predominantly pain located at shoulder.   Takes tylenol nightly, has tried voltaren gel.   Remote clavicle fracture left side 1977.  Known severe R shoulder arthritis by xray treated with aleve prn.   Has never received steroid injection.  Relevant past medical, surgical, family and social history reviewed and updated as indicated. Interim medical history since our last visit reviewed. Allergies and medications reviewed and updated. Current Outpatient Prescriptions on File Prior to Visit  Medication Sig  . acetaminophen (TYLENOL) 500 MG tablet Take 1,000 mg by mouth 2 (two) times daily.  Marland Kitchen amLODipine (NORVASC) 5 MG tablet Take 1 tablet (5 mg total) by mouth daily.  . clonazePAM (KLONOPIN) 0.5 MG tablet TAKE ONE TABLET BY MOUTH AT BEDTIME  . fish oil-omega-3 fatty acids 1000 MG capsule Take 2 g by mouth 3 (three) times a week.  . levothyroxine (SYNTHROID, LEVOTHROID) 75 MCG tablet Take 1 tablet (75 mcg total) by mouth daily.  . Misc Natural Products (OSTEO BI-FLEX TRIPLE STRENGTH PO) Take 1 tablet by mouth daily. With Vit D  . pantoprazole (PROTONIX) 40 MG tablet TAKE ONE TABLET BY MOUTH ONCE DAILY  . pravastatin (PRAVACHOL) 20 MG tablet Take 1 tablet (20 mg total) by mouth daily.  . Probiotic Product (PROBIOTIC DAILY PO) Take 1 capsule by mouth daily.   . sertraline (ZOLOFT) 100 MG tablet Take 1 tablet (100 mg total) by mouth daily.  . VOLTAREN 1 % GEL APPLY ONE APPLICATION TOPICALLY 3 TIMES DAILY    . BIOTIN PO Take by mouth daily.  . Cholecalciferol (VITAMIN D PO) Take by mouth daily.  . polyethylene glycol powder (MIRALAX) powder Take by mouth. OTC as directed.  . traMADol (ULTRAM) 50 MG tablet Take 1 tablet (50 mg total) by mouth 2 (two) times daily as needed for moderate pain. (Patient not taking: Reported on 09/26/2014)   No current facility-administered medications on file prior to visit.    Review of Systems Per HPI unless specifically indicated above     Objective:    BP 122/64 mmHg  Pulse 64  Temp(Src) 97.6 F (36.4 C) (Oral)  Wt 158 lb 8 oz (71.895 kg)  Wt Readings from Last 3 Encounters:  11/04/14 158 lb 8 oz (71.895 kg)  09/26/14 161 lb 8 oz (73.256 kg)  08/02/14 166 lb 8 oz (75.524 kg)    Physical Exam  Constitutional: She appears well-developed and well-nourished. No distress.  Musculoskeletal: She exhibits no edema.  Left shoulder exam: No deformity of shoulders on inspection. + tender with palpation of posterior shoulder at bursa Limited ROM in abduction flexion 2/2 pain (unable past 110 deg). No pain or weakness with testing SITS in ext/int rotation. + pain with empty can sign. + yerguson test. + impingement. + pain and marked crepitus with rotation of humeral head in [place] joint.  Skin: Skin is warm and dry. No rash noted.  Psychiatric: She has a normal mood  and affect.  Nursing note and vitals reviewed.   L shoulder steroid injection:  IC obtained and in chart.  Landmarks palpated and area cleaned with EtOH wipe. Ethyl chloride for numbing. Using posterior lateral approach, steroid injection performed today with 1cc depo medrol (40mg ), 4cc 1% lidocaine using 22g 1.5in needle. Unable to get entire amt in likely due to arthritic burden of shoulder joint. Pt tolerated procedure well.    Assessment & Plan:   Problem List Items Addressed This Visit    Left shoulder pain - Primary    Anticipate combination of bursitis, arthritis with impingement and  rotator cuff tendinopathy along with possible labral tear.  Xray today - significant arthritis. Treat with depo-medrol shoulder injection today. Pt seemed to tolerate well. Start aleve tomorrow 1 tab daily prn for 2 wk course, continue tylenol as well. Update if no improvement for referral to ortho, consider MRI to eval glenohumeral joint.      Relevant Orders   DG Shoulder Left       Follow up plan: Return if symptoms worsen or fail to improve.

## 2014-11-04 NOTE — Progress Notes (Signed)
Pre visit review using our clinic review tool, if applicable. No additional management support is needed unless otherwise documented below in the visit note. 

## 2014-11-04 NOTE — Addendum Note (Signed)
Addended by: Royann Shivers A on: 11/04/2014 02:25 PM   Modules accepted: Orders

## 2014-11-04 NOTE — Patient Instructions (Signed)
i think you have combination of bursitis, arthritis and possible shoulder tear. Xrays of left shoulder pain. Steroid injection of left shoulder today. If warmth or redness develops at the injection site, please return right away for evaluation. If no better, let us know for referral to orthopedist.

## 2014-11-04 NOTE — Assessment & Plan Note (Signed)
Anticipate combination of bursitis, arthritis with impingement and rotator cuff tendinopathy along with possible labral tear.  Xray today - significant arthritis. Treat with depo-medrol shoulder injection today. Pt seemed to tolerate well. Start aleve tomorrow 1 tab daily prn for 2 wk course, continue tylenol as well. Update if no improvement for referral to ortho, consider MRI to eval glenohumeral joint.

## 2014-11-13 ENCOUNTER — Telehealth: Payer: Self-pay | Admitting: Family Medicine

## 2014-11-13 DIAGNOSIS — M25519 Pain in unspecified shoulder: Secondary | ICD-10-CM

## 2014-11-13 NOTE — Telephone Encounter (Signed)
Pt called to let dr g know her left arm is doing a little bit better but it still hurts when she moves it.

## 2014-11-13 NOTE — Telephone Encounter (Signed)
Per Dr. Synthia Innocent notes, refer to ortho if not a lot better.  Referral is in.  Thanks.

## 2014-11-13 NOTE — Telephone Encounter (Signed)
Patient notified as instructed by telephone and verbalized understanding. Advised patient that one of the referral coordinators will be in touch to get this set up for her.  

## 2014-11-20 ENCOUNTER — Telehealth: Payer: Self-pay | Admitting: Family Medicine

## 2014-11-20 DIAGNOSIS — R195 Other fecal abnormalities: Secondary | ICD-10-CM

## 2014-11-20 NOTE — Telephone Encounter (Signed)
Pt called wanting to get a referral to Dr Tana Conch in [place].  She goes to a GI dr in [place], [place] is more convenient for her.  She has to take something to have a bowel movement.  Then it is either diarrhea or little marbles.

## 2014-11-20 NOTE — Telephone Encounter (Signed)
I put in the referral.  Thanks.  

## 2014-11-21 ENCOUNTER — Other Ambulatory Visit
Admission: RE | Admit: 2014-11-21 | Discharge: 2014-11-21 | Disposition: A | Discharge status: Home / Self Care | Payer: Medicare Other | Source: Ambulatory Visit | Attending: Surgery | Admitting: Surgery

## 2014-11-21 DIAGNOSIS — M19011 Primary osteoarthritis, right shoulder: Secondary | ICD-10-CM | POA: Insufficient documentation

## 2014-11-21 LAB — SYNOVIAL CELL COUNT + DIFF, W/ CRYSTALS
EOSINOPHILS-SYNOVIAL: 0 %
Lymphocytes-Synovial Fld: 10 %
Monocyte-Macrophage-Synovial Fluid: 13 %
Neutrophil, Synovial: 77 %
OTHER CELLS-SYN: 0
WBC, SYNOVIAL: 134775 /mm3 — AB (ref 0–200)

## 2014-11-22 ENCOUNTER — Other Ambulatory Visit: Payer: Self-pay | Admitting: Surgery

## 2014-11-22 DIAGNOSIS — M19011 Primary osteoarthritis, right shoulder: Secondary | ICD-10-CM

## 2014-11-22 DIAGNOSIS — M7582 Other shoulder lesions, left shoulder: Secondary | ICD-10-CM

## 2014-11-22 DIAGNOSIS — M25511 Pain in right shoulder: Secondary | ICD-10-CM

## 2014-11-22 DIAGNOSIS — M19012 Primary osteoarthritis, left shoulder: Secondary | ICD-10-CM

## 2014-11-25 LAB — BODY FLUID CULTURE: Special Requests: NORMAL

## 2014-11-29 ENCOUNTER — Ambulatory Visit
Admission: RE | Admit: 2014-11-29 | Discharge: 2014-11-29 | Disposition: A | Discharge status: Home / Self Care | Payer: Medicare Other | Source: Ambulatory Visit | Attending: Surgery | Admitting: Surgery

## 2014-11-29 DIAGNOSIS — M064 Inflammatory polyarthropathy: Secondary | ICD-10-CM | POA: Diagnosis not present

## 2014-11-29 DIAGNOSIS — M19012 Primary osteoarthritis, left shoulder: Secondary | ICD-10-CM

## 2014-11-29 DIAGNOSIS — M19011 Primary osteoarthritis, right shoulder: Secondary | ICD-10-CM | POA: Diagnosis not present

## 2014-11-29 DIAGNOSIS — M25511 Pain in right shoulder: Secondary | ICD-10-CM

## 2014-11-29 DIAGNOSIS — M7582 Other shoulder lesions, left shoulder: Secondary | ICD-10-CM | POA: Insufficient documentation

## 2014-11-29 LAB — SYNOVIAL CELL COUNT + DIFF, W/ CRYSTALS
CRYSTALS FLUID: NONE SEEN
Lymphocytes-Synovial Fld: 1 % (ref 0–20)
Monocyte-Macrophage-Synovial Fluid: 4 % — ABNORMAL LOW (ref 50–90)
Neutrophil, Synovial: 95 % — ABNORMAL HIGH (ref 0–25)
WBC, SYNOVIAL: 33835 /mm3 — AB (ref 0–200)

## 2014-12-02 ENCOUNTER — Other Ambulatory Visit: Payer: Self-pay | Admitting: Family Medicine

## 2014-12-02 ENCOUNTER — Telehealth: Payer: Self-pay

## 2014-12-02 DIAGNOSIS — M1120 Other chondrocalcinosis, unspecified site: Secondary | ICD-10-CM

## 2014-12-02 LAB — BODY FLUID CULTURE: Culture: NO GROWTH

## 2014-12-02 NOTE — Telephone Encounter (Signed)
Spoke with patient and advised Dr. Alla German thought and advised that he wanted to wait for Dr. Darnell Level. Records requested from Dr. Roland Rack to try to get more info. Patient verbalized understanding.

## 2014-12-02 NOTE — Telephone Encounter (Signed)
Pt left v/m; pt has seen bone dr,Dr Poggi at [facility] and received 4 shots and had MRI; bone dr recommended gout med, colchicine or allopurinol, which ever med would be less damage to kidneys.  Pt is hurting in both shoulders. Pt said Dr Roland Rack could not prescribe med due pt having kidney disease stage 3 and sent pt to PCP.  Pt request med sent to [address]. Advised pt may need to schedule appt; pt said to send note first to see if can get med since already saw Dr Roland Rack. Dr Darnell Level out of office.Please advise.last annual exam 09/26/14.

## 2014-12-02 NOTE — Telephone Encounter (Signed)
She has no history of gout and her uric acid level was normal last year--so I am not sure why he is diagnosing gout. I looked at the MRI reports---no clear indication of gout (though there may be a component of inflammatory arthritis of some type) This seems to be an unclear situation --but neither colchicine or allopurinol (both of which could at least be tried with her kidneys) would be an appropriate choice. I think this might need to wait for Dr Darnell Level

## 2014-12-04 NOTE — Telephone Encounter (Signed)
Agree. Will await to review records from Dr Roland Rack.

## 2014-12-08 DIAGNOSIS — M1120 Other chondrocalcinosis, unspecified site: Secondary | ICD-10-CM | POA: Insufficient documentation

## 2014-12-08 NOTE — Telephone Encounter (Signed)
Reviewed records - aspiration performed by Dr Roland Rack consistent with pseudogout (calcium pyrophosphate crystals).  She had R then L shoulder steroid injections.  Plz notify - she has pseudogout, not gout. Treatment is NSAIDs, colchicine or steroids. We need to be careful with kidneys with all three medicines. How is left shoulder after steroid shot by Dr Roland Rack on 8/5?

## 2014-12-09 NOTE — Telephone Encounter (Signed)
Ended up having to go to [place] [facility] yesterday due to pain. Was given a Rx for colchicine and hydrocodone. The colchicine required a PA, so she only bought 2 pills until the PA went through. She said she is in a great deal of pain and the 4 steroid injections have not helped at all.

## 2014-12-10 ENCOUNTER — Other Ambulatory Visit: Payer: Self-pay | Admitting: Family Medicine

## 2014-12-14 NOTE — Telephone Encounter (Signed)
Records reviewed. Joint fluid analysis - no growth with culture, many WBC. Received R (7/28) then L (8/5) shoulder injection

## 2014-12-19 ENCOUNTER — Ambulatory Visit (INDEPENDENT_AMBULATORY_CARE_PROVIDER_SITE_OTHER): Payer: Medicare Other | Admitting: Family Medicine

## 2014-12-19 ENCOUNTER — Encounter: Payer: Self-pay | Admitting: Family Medicine

## 2014-12-19 VITALS — BP 120/72 | HR 74 | Temp 98.1°F | Wt 157.0 lb

## 2014-12-19 DIAGNOSIS — M1120 Other chondrocalcinosis, unspecified site: Secondary | ICD-10-CM

## 2014-12-19 DIAGNOSIS — M1180 Other specified crystal arthropathies, unspecified site: Secondary | ICD-10-CM | POA: Diagnosis not present

## 2014-12-19 DIAGNOSIS — N183 Chronic kidney disease, stage 3 unspecified: Secondary | ICD-10-CM

## 2014-12-19 DIAGNOSIS — I10 Essential (primary) hypertension: Secondary | ICD-10-CM | POA: Diagnosis not present

## 2014-12-19 DIAGNOSIS — E785 Hyperlipidemia, unspecified: Secondary | ICD-10-CM | POA: Diagnosis not present

## 2014-12-19 LAB — RENAL FUNCTION PANEL
ALBUMIN: 3.7 g/dL (ref 3.5–5.2)
BUN: 17 mg/dL (ref 6–23)
CALCIUM: 9.1 mg/dL (ref 8.4–10.5)
CO2: 30 meq/L (ref 19–32)
Chloride: 101 mEq/L (ref 96–112)
Creatinine, Ser: 1.17 mg/dL (ref 0.40–1.20)
GFR: 47.06 mL/min — AB (ref >60.00)
Glucose, Bld: 91 mg/dL (ref 70–99)
POTASSIUM: 4 meq/L (ref 3.5–5.1)
Phosphorus: 4 mg/dL (ref 2.3–4.6)
Sodium: 137 mEq/L (ref 135–145)

## 2014-12-19 MED ORDER — PRAVASTATIN SODIUM 20 MG PO TABS: 20.0000 mg | ORAL_TABLET | Freq: Every day | ORAL | Status: DC

## 2014-12-19 MED ORDER — AMLODIPINE BESYLATE 5 MG PO TABS: 5.0000 mg | ORAL_TABLET | Freq: Every day | ORAL | Status: DC

## 2014-12-19 NOTE — Assessment & Plan Note (Signed)
Reviewed dx with patient. rec continue colchicine 0.6mg  daily until shoulder pain resolved then stop, and use PRN. May continue hydrocodone PRN breakthrough pain for shoulders. Pt has f/u scheduled 01/2015 with Dr Jefm Bryant.

## 2014-12-19 NOTE — Progress Notes (Signed)
BP 120/72 mmHg  Pulse 74  Temp(Src) 98.1 F (36.7 C) (Oral)  Wt 157 lb (71.215 kg)  SpO2 98%   CC: f/u visit pseudogout  Subjective:    Patient ID: Brooke Beasley, female    DOB: [birth year], 81 y.o.   MRN: [id number]  HPI: Brooke Beasley is a 81 y.o. female presenting on 12/19/2014 for Follow-up  See last office note and recent phone notes. Ongoing L shoulder pain for 1+ month, received L bursal steroid injection 11/04/2014 without significant improvement. Seen by Dr Roland Rack orthopedist with aspiration and steroid injection, dx with CPPD (pseudogout) and treated with colchicine and hydrocodone.   Joint fluid analysis - no growth with culture, many WBC. Received R (7/28) then L (8/5) shoulder injection without significant improvement.   She has been taking colchicine 1 pill daily since Sunday - shoulder sore but not as severe pain as previously. Hydrocodone also helped, she took for 3 days. Tramadol not effective.   S/p bilateral shoulder MRI showing:  IMPRESSION RIGHT:  1. Advanced glenohumeral joint degenerative changes likely  accounting for the patient's shoulder pain.  2. Moderate rotator cuff tendinopathy/tendinosis but no partial or  full-thickness tear.  3. Grossly normal/intact long head biceps tendon and glenoid labrum.  4. AC joint degenerative changes with anterior and lateral  downsloping of a type 1 acromion.  IMPRESSION LEFT:  1. Severe glenohumeral arthritis. This is consistent with a  combination of osteoarthritis and inflammatory arthritis. In this  age group, rheumatoid arthritis and [place] shoulder of the  primary differential considerations. Other etiologies such as  neuropathic joint, chronic septic arthritis or ordinary  osteoarthritis are less likely.  2. Rotator cuff degeneration without a full-thickness rotator cuff tear.  Relevant past medical, surgical, family and social history reviewed and updated as indicated. Interim medical history since our  last visit reviewed. Allergies and medications reviewed and updated. Current Outpatient Prescriptions on File Prior to Visit  Medication Sig  . acetaminophen (TYLENOL) 500 MG tablet Take 1,000 mg by mouth 2 (two) times daily.  Marland Kitchen BIOTIN PO Take by mouth daily.  . Cholecalciferol (VITAMIN D PO) Take by mouth daily.  . clonazePAM (KLONOPIN) 0.5 MG tablet TAKE ONE TABLET BY MOUTH AT BEDTIME  . fish oil-omega-3 fatty acids 1000 MG capsule Take 2 g by mouth 3 (three) times a week.  . levothyroxine (SYNTHROID, LEVOTHROID) 75 MCG tablet Take 1 tablet (75 mcg total) by mouth daily.  . Misc Natural Products (OSTEO BI-FLEX TRIPLE STRENGTH PO) Take 1 tablet by mouth daily. With Vit D  . pantoprazole (PROTONIX) 40 MG tablet TAKE ONE TABLET BY MOUTH ONCE DAILY  . polyethylene glycol powder (MIRALAX) powder Take by mouth. OTC as directed.  . Probiotic Product (PROBIOTIC DAILY PO) Take 1 capsule by mouth daily.   . sertraline (ZOLOFT) 100 MG tablet Take 1 tablet (100 mg total) by mouth daily.  . Timolol Maleate (ISTALOL) 0.5 % (DAILY) SOLN Apply 1 drop to eye at bedtime.  . traMADol (ULTRAM) 50 MG tablet Take 1 tablet (50 mg total) by mouth 2 (two) times daily as needed for moderate pain.  Marland Kitchen VOLTAREN 1 % GEL APPLY ONE APPLICATION TOPICALLY 3 TIMES DAILY   No current facility-administered medications on file prior to visit.    Review of Systems Per HPI unless specifically indicated above     Objective:    BP 120/72 mmHg  Pulse 74  Temp(Src) 98.1 F (36.7 C) (Oral)  Wt 157 lb (71.215 kg)  SpO2 98%  Wt Readings from Last 3 Encounters:  12/19/14 157 lb (71.215 kg)  11/29/14 158 lb (71.668 kg)  11/04/14 158 lb 8 oz (71.895 kg)    Physical Exam  Constitutional: She appears well-developed and well-nourished. No distress.  HENT:  Mouth/Throat: Oropharynx is clear and moist. No oropharyngeal exudate.  Cardiovascular: Normal rate, regular rhythm and intact distal pulses.   Murmur (3/6 SEM )  heard. Pulmonary/Chest: Effort normal and breath sounds normal. No respiratory distress. She has no wheezes. She has no rales.  Musculoskeletal: She exhibits no edema.  No shoulder pain to palpation today  Skin: Skin is warm and dry. No rash noted.  Psychiatric: She has a normal mood and affect.  Nursing note and vitals reviewed.  Results for orders placed or performed during the hospital encounter of 11/29/14  Body fluid culture  Result Value Ref Range   Specimen Description FLUID    Special Requests NONE    Gram Stain MANY WBC SEEN NO ORGANISMS SEEN     Culture NO GROWTH 3 DAYS    Report Status 12/02/2014 FINAL   Synovial cell count + diff, w/ crystals  Result Value Ref Range   Color, Synovial YELLOW YELLOW   Appearance-Synovial CLOUDY (A) CLEAR   Crystals, Fluid NONE SEEN    WBC, Synovial 33835 (H) 0 - 200 /cu mm   Neutrophil, Synovial 95 (H) 0 - 25 %   Lymphocytes-Synovial Fld 1 0 - 20 %   Monocyte-Macrophage-Synovial Fluid 4 (L) 50 - 90 %      Assessment & Plan:   Problem List Items Addressed This Visit    HLD (hyperlipidemia)    Continue pravastatin - better tolerated than prior simvastatin trial. Pt denies worsening myalgias with colchicine on board.      Relevant Medications   amLODipine (NORVASC) 5 MG tablet   pravastatin (PRAVACHOL) 20 MG tablet   HYPERTENSION, BENIGN    Chronic, stable on amlodipine instead of losartan. Continue this med. ACEI/ARB caused renal insufficiency. Sent in 3 mo supply per pt request to mail order.      Relevant Medications   amLODipine (NORVASC) 5 MG tablet   pravastatin (PRAVACHOL) 20 MG tablet   CKD (chronic kidney disease) stage 3, GFR 30-59 ml/min    Deteriorated with ARB. Recheck today. Anticipate improvement on amlodipine.       Pseudogout - Primary    Reviewed dx with patient. rec continue colchicine 0.6mg  daily until shoulder pain resolved then stop, and use PRN. May continue hydrocodone PRN breakthrough pain for  shoulders. Pt has f/u scheduled 01/2015 with Dr Jefm Bryant.      Relevant Orders   Renal function panel       Follow up plan: Return in about 4 months (around 04/20/2015), or if symptoms worsen or fail to improve, for follow up visit.

## 2014-12-19 NOTE — Assessment & Plan Note (Signed)
Continue pravastatin - better tolerated than prior simvastatin trial. Pt denies worsening myalgias with colchicine on board.

## 2014-12-19 NOTE — Assessment & Plan Note (Signed)
Chronic, stable on amlodipine instead of losartan. Continue this med. ACEI/ARB caused renal insufficiency. Sent in 3 mo supply per pt request to mail order.

## 2014-12-19 NOTE — Assessment & Plan Note (Signed)
Deteriorated with ARB. Recheck today. Anticipate improvement on amlodipine.

## 2014-12-19 NOTE — Progress Notes (Signed)
Pre visit review using our clinic review tool, if applicable. No additional management support is needed unless otherwise documented below in the visit note. 

## 2014-12-19 NOTE — Patient Instructions (Addendum)
labwork today I've refilled blood pressure and cholesterol medications for you today to mail order. Continue colchicine once daily as needed for shoulder pain, let us know if running out for refill. Call Dr Poggi's office to see if you need to follow up with him. Return as needed or in 4 months for follow up visit.  Pseudogout Pseudogout is similar to gout. It is an arthritis that causes pain, swelling, and inflammation in a joint. This is due to the presence of calcium pyrophosphate crystals in the joint fluid. This is a different type of crystal than the crystals that cause gout. The joint pain can be severe and may last for days. In some cases, it may last much longer and can mimic rheumatoid arthritis or osteoarthritis. CAUSES  The exact cause of the disease is not known. It develops when crystals of calcium pyrophosphate build up in a joint. These crystals cause inflammation that leads to pain and swelling of the joint.  Chances of developing pseudogout increase with age. It often follows a minor injury.  The condition may be passed down from parent to child (hereditary).  Events such as strokes, heart attacks, or surgery may increase the risk of pseudogout.  Pseudogout can be associated with other disorders (hemophilia, ochronosis, amyloidosis, or hormonal disorders).  Pseudogout can be associated with dehydration, especially following surgery or hospitalization. Patients with known pseudogout should stay well hydrated before and after surgery. SYMPTOMS   Intense, constant pain in one joint that seems to come on for no reason.  The joint area may be hot to the touch, red, swollen, and stiff.  Pain may last from several days to a few weeks. It may then disappear. Later, it may start again, possibly in a different joint.  Pseudogout usually affects the knees. It can also affect the wrists, elbows, shoulders, and ankles. DIAGNOSIS  The diagnosis is often suggested by your exam or is  suspected when an abnormal buildup of calcium salts (calcifications) are seen in the cartilage of joints on X-rays. The final diagnosis is made when fluid from the joint is examined under a special microscope used to find calcium pyrophosphate crystals. The crystals of pseudogout and gout may both be present at the same time. TREATMENT  Nonsteroidal anti-inflammatory drugs (NSAIDs), such as naproxen, treat the pain. Identifying the trigger of pseudogout and treating the underlying cause, such as dehydration, is also important. There is no way to remove the crystals themselves. HOME CARE INSTRUCTIONS   Put ice on the sore joint.  Put ice in a plastic bag.  Place a towel between your skin and the bag.  Leave the ice on for 15-20 minutes at a time, 03-04 times a day, for the first 2 days.  Keep your affected joints raised (elevated) when possible to lessen swelling.  Use crutches (non-weight bearing) as needed. Walk without crutches as the pain allows, or as instructed. Gradually, start bearing weight.  Only take over-the-counter or prescription medicines for pain, discomfort, or fever as directed by your caregiver.  Once you recover from the painful attack, exercise regularly to keep your muscle strength. Not using a sore joint will cause the muscles around it to become weak. This may increase pain. Low-impact exercises, such as swimming, bicycling, water aerobics, and walking, may be best. This will give you energy, strengthen your heart, help you control your weight, and improve your well-being.  Maintain a healthy weight so your joints do not need to bear more weight than necessary. SEEK  MEDICAL CARE IF:   You have an increase in joint pain not relieved with medicine.  You have a fever.  You have more serious symptoms such as skin rash, diarrhea, vomiting, headache, or other joint pains. FOR MORE INFORMATION  Arthritis Foundation: www.arthritis.[facility] of Arthritis and  Musculoskeletal and Skin Diseases: www.niams.[employer] Document Released: 01/03/2004 Document Revised: 07/05/2011 Document Reviewed: 07/25/2009 [facility] Patient Information 2015 [place], [place]. This information is not intended to replace advice given to you by your health care provider. Make sure you discuss any questions you have with your health care provider.   IMPRESSION RIGHT:  1. Advanced glenohumeral joint degenerative changes likely accounting for the patient's shoulder pain.  2. Moderate rotator cuff tendinopathy/tendinosis but no partial or full-thickness tear.  3. Grossly normal/intact long head biceps tendon and glenoid labrum.  4. AC joint degenerative changes with anterior and lateral downsloping of a type 1 acromion.  IMPRESSION LEFT:  1. Severe glenohumeral arthritis. This is consistent with a combination of osteoarthritis and inflammatory arthritis. In this age group, rheumatoid arthritis and [place] shoulder of the primary differential considerations. Other etiologies such as neuropathic joint, chronic septic arthritis or ordinary osteoarthritis are less likely.  2. Rotator cuff degeneration without a full-thickness rotator cuff tear.

## 2014-12-27 ENCOUNTER — Encounter: Payer: Self-pay | Admitting: Family Medicine

## 2015-01-12 ENCOUNTER — Other Ambulatory Visit: Payer: Self-pay | Admitting: Family Medicine

## 2015-01-12 NOTE — Telephone Encounter (Signed)
plz phone in. 

## 2015-01-13 NOTE — Telephone Encounter (Signed)
Rx called in as directed.   

## 2015-03-12 ENCOUNTER — Other Ambulatory Visit: Payer: Self-pay | Admitting: *Deleted

## 2015-03-12 MED ORDER — LEVOTHYROXINE SODIUM 75 MCG PO TABS: 75.0000 ug | ORAL_TABLET | Freq: Every day | ORAL | Status: DC

## 2015-03-27 ENCOUNTER — Ambulatory Visit: Payer: Medicare Other | Admitting: Family Medicine

## 2015-04-03 ENCOUNTER — Encounter: Payer: Self-pay | Admitting: Family Medicine

## 2015-04-03 ENCOUNTER — Ambulatory Visit (INDEPENDENT_AMBULATORY_CARE_PROVIDER_SITE_OTHER): Payer: Medicare Other | Admitting: Family Medicine

## 2015-04-03 VITALS — BP 118/76 | HR 64 | Temp 97.7°F | Wt 155.5 lb

## 2015-04-03 DIAGNOSIS — I10 Essential (primary) hypertension: Secondary | ICD-10-CM

## 2015-04-03 DIAGNOSIS — N183 Chronic kidney disease, stage 3 unspecified: Secondary | ICD-10-CM

## 2015-04-03 DIAGNOSIS — M1990 Unspecified osteoarthritis, unspecified site: Secondary | ICD-10-CM

## 2015-04-03 DIAGNOSIS — M810 Age-related osteoporosis without current pathological fracture: Secondary | ICD-10-CM

## 2015-04-03 DIAGNOSIS — F418 Other specified anxiety disorders: Secondary | ICD-10-CM

## 2015-04-03 DIAGNOSIS — E039 Hypothyroidism, unspecified: Secondary | ICD-10-CM

## 2015-04-03 DIAGNOSIS — M1180 Other specified crystal arthropathies, unspecified site: Secondary | ICD-10-CM | POA: Diagnosis not present

## 2015-04-03 DIAGNOSIS — M1120 Other chondrocalcinosis, unspecified site: Secondary | ICD-10-CM

## 2015-04-03 DIAGNOSIS — S02401D Maxillary fracture, unspecified, subsequent encounter for fracture with routine healing: Secondary | ICD-10-CM

## 2015-04-03 LAB — RENAL FUNCTION PANEL
Albumin: 3.9 g/dL (ref 3.5–5.2)
BUN: 17 mg/dL (ref 6–23)
CO2: 29 mEq/L (ref 19–32)
Calcium: 9.3 mg/dL (ref 8.4–10.5)
Chloride: 102 mEq/L (ref 96–112)
Creatinine, Ser: 1.06 mg/dL (ref 0.40–1.20)
GFR: 52.7 mL/min — AB (ref >60.00)
GLUCOSE: 102 mg/dL — AB (ref 70–99)
Phosphorus: 4 mg/dL (ref 2.3–4.6)
Potassium: 4.1 mEq/L (ref 3.5–5.1)
SODIUM: 139 meq/L (ref 135–145)

## 2015-04-03 LAB — TSH: TSH: 2.31 u[IU]/mL (ref 0.35–4.50)

## 2015-04-03 NOTE — Assessment & Plan Note (Signed)
longterm sertraline 100mg  , klonopin 0.5mg  nightly. Ongoing stressors. Continue.

## 2015-04-03 NOTE — Assessment & Plan Note (Signed)
Healed well from this, asxs.

## 2015-04-03 NOTE — Progress Notes (Signed)
BP 118/76 mmHg  Pulse 64  Temp(Src) 97.7 F (36.5 C) (Oral)  Wt 155 lb 8 oz (70.534 kg)   CC: f/u visit  Subjective:    Patient ID: Brooke Beasley, female    DOB: 1930-06-20, 81 y.o.   MRN: [id number]  HPI: WILLENA DEGOLIER is a 81 y.o. female presenting on 04/03/2015 for Follow-up   Sister passed away 6 wks ago. On hospice for 4 days. Pt doesn't think would want life support. Will bring me her living will to update chart.   CPPD localized to shoulders - recent dx by orthopedist. Has colchicine 1 pill daily PRN with hydrocodone for breakthrough pain. Has established with Dr Jefm Bryant rheumatologist. Has f/u with ortho next week.colchicine is very expensive. Taking osteo biflex with vit D. Confused who she should continue to see (Poggi vs Jefm Bryant).   HTN - stable on current regimen. ARB worsened kidney function. Continue amlodipine.   CKD - worsened with ACEI/ARB.   Anxiety/depression - stable on sertraline 100mg  daily and klonopin 0.5mg  nightly.   IBS - stable on probiotic and gasX.  Relevant past medical, surgical, family and social history reviewed and updated as indicated. Interim medical history since our last visit reviewed. Allergies and medications reviewed and updated. Current Outpatient Prescriptions on File Prior to Visit  Medication Sig  . acetaminophen (TYLENOL) 500 MG tablet Take 1,000 mg by mouth 2 (two) times daily.  Marland Kitchen amLODipine (NORVASC) 5 MG tablet Take 1 tablet (5 mg total) by mouth daily.  Marland Kitchen BIOTIN PO Take by mouth daily.  . Cholecalciferol (VITAMIN D PO) Take by mouth daily.  . clonazePAM (KLONOPIN) 0.5 MG tablet TAKE ONE TABLET BY MOUTH AT BEDTIME  . colchicine 0.6 MG tablet Take 0.6 mg by mouth daily as needed.   . fish oil-omega-3 fatty acids 1000 MG capsule Take 2 g by mouth 3 (three) times a week.  . levothyroxine (SYNTHROID, LEVOTHROID) 75 MCG tablet Take 1 tablet (75 mcg total) by mouth daily.  . Misc Natural Products (OSTEO BI-FLEX TRIPLE STRENGTH  PO) Take 1 tablet by mouth daily. With Vit D  . pantoprazole (PROTONIX) 40 MG tablet TAKE ONE TABLET BY MOUTH ONCE DAILY  . polyethylene glycol powder (MIRALAX) powder Take by mouth. OTC as directed.  . pravastatin (PRAVACHOL) 20 MG tablet Take 1 tablet (20 mg total) by mouth daily.  . Probiotic Product (PROBIOTIC DAILY PO) Take 1 capsule by mouth daily.   . sertraline (ZOLOFT) 100 MG tablet Take 1 tablet (100 mg total) by mouth daily.  . Timolol Maleate (ISTALOL) 0.5 % (DAILY) SOLN Apply 1 drop to eye at bedtime.  . traMADol (ULTRAM) 50 MG tablet Take 1 tablet (50 mg total) by mouth 2 (two) times daily as needed for moderate pain.  Marland Kitchen VOLTAREN 1 % GEL APPLY ONE APPLICATION TOPICALLY 3 TIMES DAILY   No current facility-administered medications on file prior to visit.    Review of Systems Per HPI unless specifically indicated in ROS section     Objective:    BP 118/76 mmHg  Pulse 64  Temp(Src) 97.7 F (36.5 C) (Oral)  Wt 155 lb 8 oz (70.534 kg)  Wt Readings from Last 3 Encounters:  04/03/15 155 lb 8 oz (70.534 kg)  12/19/14 157 lb (71.215 kg)  11/29/14 158 lb (71.668 kg)    Physical Exam  Constitutional: She appears well-developed and well-nourished. No distress.  HENT:  Mouth/Throat: Oropharynx is clear and moist. No oropharyngeal exudate.  Cardiovascular: Normal rate,  regular rhythm and intact distal pulses.   Murmur (2/6 SEM at USB) heard. Pulmonary/Chest: Effort normal and breath sounds normal. No respiratory distress. She has no wheezes. She has no rales.  Musculoskeletal: She exhibits no edema.  Psychiatric: She has a normal mood and affect.  Nursing note and vitals reviewed.  Results for orders placed or performed in visit on 12/19/14  Renal function panel  Result Value Ref Range   Sodium 137 135 - 145 mEq/L   Potassium 4.0 3.5 - 5.1 mEq/L   Chloride 101 96 - 112 mEq/L   CO2 30 19 - 32 mEq/L   Calcium 9.1 8.4 - 10.5 mg/dL   Albumin 3.7 3.5 - 5.2 g/dL   BUN 17 6 -  23 mg/dL   Creatinine, Ser 1.17 0.40 - 1.20 mg/dL   Glucose, Bld 91 70 - 99 mg/dL   Phosphorus 4.0 2.3 - 4.6 mg/dL   GFR 47.06 (L) >60.00 mL/min      Assessment & Plan:   Problem List Items Addressed This Visit    Pseudogout - Primary    Reviewed with patient. She will f/u with ortho next Friday then decide on need for rheum f/u      Osteoporosis    Now regular with vit D. Not on bisphosphonate 2/2 CKD.       Osteoarthritis    Regular with osteo biflex with vit D.      RESOLVED: Maxillary fracture ([place])    Healed well from this, asxs.      Hypothyroidism    Recheck TSH.       Relevant Orders   TSH   HYPERTENSION, BENIGN    Chronic, stable. Continue amlodipine. ACE/ARB worsened renal insufficiency in the past. Consider renal doppler.       CKD (chronic kidney disease) stage 3, GFR 30-59 ml/min    Recheck renal panel.      Relevant Orders   Renal function panel   Anxiety associated with depression    longterm sertraline 100mg  , klonopin 0.5mg  nightly. Ongoing stressors. Continue.          Follow up plan: Return in about 6 months (around 10/02/2015), or as needed, for medicare wellness visit.

## 2015-04-03 NOTE — Patient Instructions (Addendum)
Bring [place] a copy of your living will.  Continue medicines as up to now.  Labs today. Return in 6 months for medicare wellness visit.

## 2015-04-03 NOTE — Assessment & Plan Note (Signed)
Regular with osteo biflex with vit D.

## 2015-04-03 NOTE — Assessment & Plan Note (Signed)
Reviewed with patient. She will f/u with ortho next Friday then decide on need for rheum f/u

## 2015-04-03 NOTE — Assessment & Plan Note (Signed)
Recheck TSH 

## 2015-04-03 NOTE — Assessment & Plan Note (Signed)
Chronic, stable. Continue amlodipine. ACE/ARB worsened renal insufficiency in the past. Consider renal doppler.

## 2015-04-03 NOTE — Progress Notes (Signed)
Pre visit review using our clinic review tool, if applicable. No additional management support is needed unless otherwise documented below in the visit note. 

## 2015-04-03 NOTE — Assessment & Plan Note (Signed)
Recheck renal panel.  

## 2015-04-03 NOTE — Assessment & Plan Note (Signed)
Now regular with vit D. Not on bisphosphonate 2/2 CKD.

## 2015-04-07 ENCOUNTER — Encounter: Payer: Self-pay | Admitting: *Deleted

## 2015-05-01 ENCOUNTER — Telehealth: Payer: Self-pay | Admitting: Family Medicine

## 2015-05-01 MED ORDER — CLONAZEPAM 0.5 MG PO TABS: 0.5000 mg | ORAL_TABLET | Freq: Every day | ORAL | Status: DC

## 2015-05-01 MED ORDER — COLCHICINE 0.6 MG PO TABS: 0.6000 mg | ORAL_TABLET | Freq: Every day | ORAL | Status: DC | PRN

## 2015-05-01 NOTE — Telephone Encounter (Signed)
Seen at husband's appt. Requests refills of meds.  plz phone in klonopin

## 2015-05-02 MED ORDER — CLONAZEPAM 0.5 MG PO TABS: 0.5000 mg | ORAL_TABLET | Freq: Every day | ORAL | Status: DC

## 2015-05-02 NOTE — Telephone Encounter (Signed)
Needs written script to be faxed to mail order. In your IN box for signature.

## 2015-05-02 NOTE — Telephone Encounter (Signed)
Signed and in Kim's box. 

## 2015-05-05 NOTE — Telephone Encounter (Signed)
Rx faxed to mail order 

## 2015-05-05 NOTE — Telephone Encounter (Addendum)
Pt left v/m Clonazepam not called in to pharmacy; pt request cb. Pt has 3 pills left.

## 2015-05-16 ENCOUNTER — Other Ambulatory Visit: Payer: Self-pay

## 2015-05-16 MED ORDER — CLONAZEPAM 0.5 MG PO TABS: 0.5000 mg | ORAL_TABLET | Freq: Every day | ORAL | Status: DC

## 2015-05-16 NOTE — Telephone Encounter (Signed)
plz phone in. 

## 2015-05-16 NOTE — Telephone Encounter (Signed)
Rx called in as directed.   

## 2015-05-16 NOTE — Telephone Encounter (Signed)
Pt has new mail order pharmacy and has requested med but not sure how many days until med arrives. Pt request refill clonazepam to [facility] [address]. Last seen 04/03/15.

## 2015-05-19 ENCOUNTER — Telehealth: Payer: Self-pay

## 2015-05-19 NOTE — Telephone Encounter (Signed)
PLEASE NOTE: All timestamps contained within this report are represented as [place] Standard Time. CONFIDENTIALTY NOTICE: This fax transmission is intended only for the addressee. It contains information that is legally privileged, confidential or otherwise protected from use or disclosure. If you are not the intended recipient, you are strictly prohibited from reviewing, disclosing, copying using or disseminating any of this information or taking any action in reliance on or regarding this information. If you have received this fax in error, please notify us immediately by telephone so that we can arrange for its return to [place]. Phone: [phone], Toll-Free: [phone], Fax: [phone] Page: 1 of 1 Call Id: [id number] [place] Patient Name: Brooke Beasley Gender: Female DOB: [birth year] Age: 82 Y 46 M 24 D Return Phone Number: [phone] (Primary), [phone] (Secondary) Address: City/State/Zip: [place] Client [place] [facility] - Client Client Site [name] Contact Type Call Call Type Triage / Clinical Relationship To Patient Self Return Phone Number [phone] (Primary) Chief Complaint Insomnia Initial Comment Caller States Dr needs to call pharmacy so she can get her meds, not sleeping Nurse Assessment Nurse: Georgina Peer, RN, Kendrick Fries Date/Time (Eastern Time): 05/16/2015 8:13:36 PM Confirm and document reason for call. If symptomatic, describe symptoms. You must click the next button to save text entered. ---Caller states she is out of her medication b/c the insurance company would only pay for 30 days by mail,, & b/c her script is for 90 days, the local pharmacy will not fill it b/c it has been supplied thru mail order once already. I suggested she ask for a loaner dose to hold her until she can reach someone  on Monday. She said she would try. This office has a directive for no after hours meds. Has the patient traveled out of the country within the last 30 days? ---Not Applicable Does the patient have any new or worsening symptoms? ---No Guidelines Guideline Title Affirmed Question Affirmed Notes Nurse Date/Time (Eastern Time) Disp. Time Eilene Ghazi Time) Disposition Final User 05/16/2015 8:16:07 PM Clinical Call Yes Georgina Peer RN, Kendrick Fries After Care Instructions Given Call Event Type User Date / Time Description

## 2015-05-19 NOTE — Telephone Encounter (Signed)
Spoke with pt and the mail order clonazepam came on 05/17/15; [facility] not [facility] med.

## 2015-07-31 DIAGNOSIS — H524 Presbyopia: Secondary | ICD-10-CM | POA: Diagnosis not present

## 2015-07-31 DIAGNOSIS — H401131 Primary open-angle glaucoma, bilateral, mild stage: Secondary | ICD-10-CM | POA: Diagnosis not present

## 2015-08-12 DIAGNOSIS — M11212 Other chondrocalcinosis, left shoulder: Secondary | ICD-10-CM | POA: Diagnosis not present

## 2015-08-12 DIAGNOSIS — M7582 Other shoulder lesions, left shoulder: Secondary | ICD-10-CM | POA: Diagnosis not present

## 2015-08-12 DIAGNOSIS — M19012 Primary osteoarthritis, left shoulder: Secondary | ICD-10-CM | POA: Diagnosis not present

## 2015-09-15 ENCOUNTER — Other Ambulatory Visit: Payer: Self-pay

## 2015-09-15 NOTE — Telephone Encounter (Signed)
Pt left v/m requesting refill clonazepam to [facility] [address]. Last printed # 30 on 05/16/15; pt last seen 04/03/2015. Pt request cb when refilled.

## 2015-09-16 MED ORDER — CLONAZEPAM 0.5 MG PO TABS: 0.5000 mg | ORAL_TABLET | Freq: Every evening | ORAL | Status: DC | PRN

## 2015-09-16 NOTE — Telephone Encounter (Signed)
Rx called in as directed.   

## 2015-09-16 NOTE — Telephone Encounter (Signed)
plz phone in. 

## 2015-09-18 ENCOUNTER — Other Ambulatory Visit: Payer: Self-pay | Admitting: *Deleted

## 2015-09-18 MED ORDER — SERTRALINE HCL 100 MG PO TABS: 100.0000 mg | ORAL_TABLET | Freq: Every day | ORAL | Status: DC

## 2015-09-18 MED ORDER — AMLODIPINE BESYLATE 5 MG PO TABS: 5.0000 mg | ORAL_TABLET | Freq: Every day | ORAL | Status: DC

## 2015-09-23 ENCOUNTER — Other Ambulatory Visit: Payer: Self-pay | Admitting: Family Medicine

## 2015-09-24 ENCOUNTER — Other Ambulatory Visit: Payer: Self-pay | Admitting: Family Medicine

## 2015-09-24 DIAGNOSIS — E039 Hypothyroidism, unspecified: Secondary | ICD-10-CM

## 2015-09-24 DIAGNOSIS — M810 Age-related osteoporosis without current pathological fracture: Secondary | ICD-10-CM

## 2015-09-24 DIAGNOSIS — I10 Essential (primary) hypertension: Secondary | ICD-10-CM

## 2015-09-24 DIAGNOSIS — R7303 Prediabetes: Secondary | ICD-10-CM

## 2015-09-24 DIAGNOSIS — N183 Chronic kidney disease, stage 3 unspecified: Secondary | ICD-10-CM

## 2015-09-24 DIAGNOSIS — E785 Hyperlipidemia, unspecified: Secondary | ICD-10-CM

## 2015-09-25 ENCOUNTER — Other Ambulatory Visit (INDEPENDENT_AMBULATORY_CARE_PROVIDER_SITE_OTHER): Payer: PPO

## 2015-09-25 ENCOUNTER — Other Ambulatory Visit: Payer: Medicare Other

## 2015-09-25 DIAGNOSIS — R7303 Prediabetes: Secondary | ICD-10-CM

## 2015-09-25 DIAGNOSIS — N183 Chronic kidney disease, stage 3 unspecified: Secondary | ICD-10-CM

## 2015-09-25 DIAGNOSIS — E785 Hyperlipidemia, unspecified: Secondary | ICD-10-CM

## 2015-09-25 DIAGNOSIS — M810 Age-related osteoporosis without current pathological fracture: Secondary | ICD-10-CM

## 2015-09-25 DIAGNOSIS — E039 Hypothyroidism, unspecified: Secondary | ICD-10-CM

## 2015-09-25 LAB — CBC WITH DIFFERENTIAL/PLATELET
BASOS ABS: 0 10*3/uL (ref 0.0–0.1)
Basophils Relative: 0.4 % (ref 0.0–3.0)
EOS PCT: 3.8 % (ref 0.0–5.0)
Eosinophils Absolute: 0.2 10*3/uL (ref 0.0–0.7)
HCT: 41.1 % (ref 36.0–46.0)
HEMOGLOBIN: 13.8 g/dL (ref 12.0–15.0)
Lymphocytes Relative: 32.5 % (ref 12.0–46.0)
Lymphs Abs: 1.8 10*3/uL (ref 0.7–4.0)
MCHC: 33.5 g/dL (ref 30.0–36.0)
MCV: 92.3 fl (ref 78.0–100.0)
MONOS PCT: 11.6 % (ref 3.0–12.0)
Monocytes Absolute: 0.7 10*3/uL (ref 0.1–1.0)
Neutro Abs: 2.9 10*3/uL (ref 1.4–7.7)
Neutrophils Relative %: 51.7 % (ref 43.0–77.0)
Platelets: 236 10*3/uL (ref 150.0–400.0)
RBC: 4.45 Mil/uL (ref 3.87–5.11)
RDW: 14.2 % (ref 11.5–15.5)
WBC: 5.7 10*3/uL (ref 4.0–10.5)

## 2015-09-25 LAB — RENAL FUNCTION PANEL
Albumin: 4.3 g/dL (ref 3.5–5.2)
BUN: 14 mg/dL (ref 6–23)
CHLORIDE: 102 meq/L (ref 96–112)
CO2: 30 meq/L (ref 19–32)
Calcium: 9.6 mg/dL (ref 8.4–10.5)
Creatinine, Ser: 0.95 mg/dL (ref 0.40–1.20)
GFR: 59.73 mL/min — AB (ref >60.00)
Glucose, Bld: 100 mg/dL — ABNORMAL HIGH (ref 70–99)
POTASSIUM: 4.2 meq/L (ref 3.5–5.1)
Phosphorus: 3.2 mg/dL (ref 2.3–4.6)
SODIUM: 139 meq/L (ref 135–145)

## 2015-09-25 LAB — HEMOGLOBIN A1C: Hgb A1c MFr Bld: 6 % (ref 4.6–6.5)

## 2015-09-25 LAB — MICROALBUMIN / CREATININE URINE RATIO
Creatinine,U: 156.2 mg/dL
MICROALB UR: 1.2 mg/dL (ref 0.0–1.9)
Microalb Creat Ratio: 0.8 mg/g (ref 0.0–30.0)

## 2015-09-25 LAB — TSH: TSH: 3.87 u[IU]/mL (ref 0.35–4.50)

## 2015-09-25 LAB — LIPID PANEL
Cholesterol: 171 mg/dL (ref 0–200)
HDL: 68.3 mg/dL (ref >39.00)
LDL CALC: 81 mg/dL (ref 0–99)
NonHDL: 103.18
TRIGLYCERIDES: 110 mg/dL (ref 0.0–149.0)
Total CHOL/HDL Ratio: 3
VLDL: 22 mg/dL (ref 0.0–40.0)

## 2015-09-25 LAB — VITAMIN D 25 HYDROXY (VIT D DEFICIENCY, FRACTURES): VITD: 37.14 ng/mL (ref 30.00–100.00)

## 2015-10-02 ENCOUNTER — Encounter: Payer: Medicare Other | Admitting: Family Medicine

## 2015-10-07 ENCOUNTER — Ambulatory Visit (INDEPENDENT_AMBULATORY_CARE_PROVIDER_SITE_OTHER): Payer: PPO | Admitting: Family Medicine

## 2015-10-07 ENCOUNTER — Encounter: Payer: Self-pay | Admitting: Family Medicine

## 2015-10-07 VITALS — BP 124/84 | HR 76 | Temp 97.9°F | Ht 62.0 in | Wt 154.5 lb

## 2015-10-07 DIAGNOSIS — M1120 Other chondrocalcinosis, unspecified site: Secondary | ICD-10-CM

## 2015-10-07 DIAGNOSIS — M810 Age-related osteoporosis without current pathological fracture: Secondary | ICD-10-CM

## 2015-10-07 DIAGNOSIS — M25512 Pain in left shoulder: Secondary | ICD-10-CM

## 2015-10-07 DIAGNOSIS — Z0000 Encounter for general adult medical examination without abnormal findings: Secondary | ICD-10-CM | POA: Diagnosis not present

## 2015-10-07 DIAGNOSIS — M1990 Unspecified osteoarthritis, unspecified site: Secondary | ICD-10-CM

## 2015-10-07 DIAGNOSIS — N183 Chronic kidney disease, stage 3 unspecified: Secondary | ICD-10-CM

## 2015-10-07 DIAGNOSIS — E039 Hypothyroidism, unspecified: Secondary | ICD-10-CM

## 2015-10-07 DIAGNOSIS — E785 Hyperlipidemia, unspecified: Secondary | ICD-10-CM

## 2015-10-07 DIAGNOSIS — Z7189 Other specified counseling: Secondary | ICD-10-CM

## 2015-10-07 DIAGNOSIS — K581 Irritable bowel syndrome with constipation: Secondary | ICD-10-CM

## 2015-10-07 DIAGNOSIS — R011 Cardiac murmur, unspecified: Secondary | ICD-10-CM

## 2015-10-07 DIAGNOSIS — R7303 Prediabetes: Secondary | ICD-10-CM

## 2015-10-07 DIAGNOSIS — I10 Essential (primary) hypertension: Secondary | ICD-10-CM

## 2015-10-07 MED ORDER — CLONAZEPAM 0.5 MG PO TABS: 0.5000 mg | ORAL_TABLET | Freq: Every evening | ORAL | Status: DC | PRN

## 2015-10-07 NOTE — Assessment & Plan Note (Signed)
Ongoing - seen by orthopedist. Recommended shoulder replacement. Discussed will need preop clearance if desires to pursue.

## 2015-10-07 NOTE — Patient Instructions (Addendum)
Schedule mammogram at your convenience. Bring [place] copy of your advanced directive.  Good to see you today, call us with questions. Return as needed or in 1 year for next medicare wellness visit  Health Maintenance, Female Adopting a healthy lifestyle and getting preventive care can go a long way to promote health and wellness. Talk with your health care provider about what schedule of regular examinations is right for you. This is a good chance for you to check in with your provider about disease prevention and staying healthy. In between checkups, there are plenty of things you can do on your own. Experts have done a lot of research about which lifestyle changes and preventive measures are most likely to keep you healthy. Ask your health care provider for more information. WEIGHT AND DIET  Eat a healthy diet  Be sure to include plenty of vegetables, fruits, low-fat dairy products, and lean protein.  Do not eat a lot of foods high in solid fats, added sugars, or salt.  Get regular exercise. This is one of the most important things you can do for your health.  Most adults should exercise for at least 150 minutes each week. The exercise should increase your heart rate and make you sweat (moderate-intensity exercise).  Most adults should also do strengthening exercises at least twice a week. This is in addition to the moderate-intensity exercise.  Maintain a healthy weight  Body mass index (BMI) is a measurement that can be used to identify possible weight problems. It estimates body fat based on height and weight. Your health care provider can help determine your BMI and help you achieve or maintain a healthy weight.  For females 25 years of age and older:   A BMI below 18.5 is considered underweight.  A BMI of 18.5 to 24.9 is normal.  A BMI of 25 to 29.9 is considered overweight.  A BMI of 30 and above is considered obese.  Watch levels of cholesterol and blood lipids  You should  start having your blood tested for lipids and cholesterol at 80 years of age, then have this test every 5 years.  You may need to have your cholesterol levels checked more often if:  Your lipid or cholesterol levels are high.  You are older than 80 years of age.  You are at high risk for heart disease.  CANCER SCREENING   Lung Cancer  Lung cancer screening is recommended for adults 57-11 years old who are at high risk for lung cancer because of a history of smoking.  A yearly low-dose CT scan of the lungs is recommended for people who:  Currently smoke.  Have quit within the past 15 years.  Have at least a 30-pack-year history of smoking. A pack year is smoking an average of one pack of cigarettes a day for 1 year.  Yearly screening should continue until it has been 15 years since you quit.  Yearly screening should stop if you develop a health problem that would prevent you from having lung cancer treatment.  Breast Cancer  Practice breast self-awareness. This means understanding how your breasts normally appear and feel.  It also means doing regular breast self-exams. Let your health care provider know about any changes, no matter how small.  If you are in your 20s or 30s, you should have a clinical breast exam (CBE) by a health care provider every 1-3 years as part of a regular health exam.  If you are 40 or older, have  a CBE every year. Also consider having a breast X-ray (mammogram) every year.  If you have a family history of breast cancer, talk to your health care provider about genetic screening.  If you are at high risk for breast cancer, talk to your health care provider about having an MRI and a mammogram every year.  Breast cancer gene (BRCA) assessment is recommended for women who have family members with BRCA-related cancers. BRCA-related cancers include:  Breast.  Ovarian.  Tubal.  Peritoneal cancers.  Results of the assessment will determine the  need for genetic counseling and BRCA1 and BRCA2 testing. Cervical Cancer Your health care provider may recommend that you be screened regularly for cancer of the pelvic organs (ovaries, uterus, and vagina). This screening involves a pelvic examination, including checking for microscopic changes to the surface of your cervix (Pap test). You may be encouraged to have this screening done every 3 years, beginning at age 63.  For women ages 2-65, health care providers may recommend pelvic exams and Pap testing every 3 years, or they may recommend the Pap and pelvic exam, combined with testing for human papilloma virus (HPV), every 5 years. Some types of HPV increase your risk of cervical cancer. Testing for HPV may also be done on women of any age with unclear Pap test results.  Other health care providers may not recommend any screening for nonpregnant women who are considered low risk for pelvic cancer and who do not have symptoms. Ask your health care provider if a screening pelvic exam is right for you.  If you have had past treatment for cervical cancer or a condition that could lead to cancer, you need Pap tests and screening for cancer for at least 20 years after your treatment. If Pap tests have been discontinued, your risk factors (such as having a new sexual partner) need to be reassessed to determine if screening should resume. Some women have medical problems that increase the chance of getting cervical cancer. In these cases, your health care provider may recommend more frequent screening and Pap tests. Colorectal Cancer  This type of cancer can be detected and often prevented.  Routine colorectal cancer screening usually begins at 80 years of age and continues through 80 years of age.  Your health care provider may recommend screening at an earlier age if you have risk factors for colon cancer.  Your health care provider may also recommend using home test kits to check for hidden blood in  the stool.  A small camera at the end of a tube can be used to examine your colon directly (sigmoidoscopy or colonoscopy). This is done to check for the earliest forms of colorectal cancer.  Routine screening usually begins at age 14.  Direct examination of the colon should be repeated every 5-10 years through 80 years of age. However, you may need to be screened more often if early forms of precancerous polyps or small growths are found. Skin Cancer  Check your skin from head to toe regularly.  Tell your health care provider about any new moles or changes in moles, especially if there is a change in a mole's shape or color.  Also tell your health care provider if you have a mole that is larger than the size of a pencil eraser.  Always use sunscreen. Apply sunscreen liberally and repeatedly throughout the day.  Protect yourself by wearing long sleeves, pants, a wide-brimmed hat, and sunglasses whenever you are outside. HEART DISEASE, DIABETES, AND HIGH  BLOOD PRESSURE   High blood pressure causes heart disease and increases the risk of stroke. High blood pressure is more likely to develop in:  People who have blood pressure in the high end of the normal range (130-139/85-89 mm Hg).  People who are overweight or obese.  People who are African American.  If you are 64-51 years of age, have your blood pressure checked every 3-5 years. If you are 78 years of age or older, have your blood pressure checked every year. You should have your blood pressure measured twice--once when you are at a hospital or clinic, and once when you are not at a hospital or clinic. Record the average of the two measurements. To check your blood pressure when you are not at a hospital or clinic, you can use:  An automated blood pressure machine at a pharmacy.  A home blood pressure monitor.  If you are between 68 years and 38 years old, ask your health care provider if you should take aspirin to prevent  strokes.  Have regular diabetes screenings. This involves taking a blood sample to check your fasting blood sugar level.  If you are at a normal weight and have a low risk for diabetes, have this test once every three years after 80 years of age.  If you are overweight and have a high risk for diabetes, consider being tested at a younger age or more often. PREVENTING INFECTION  Hepatitis B  If you have a higher risk for hepatitis B, you should be screened for this virus. You are considered at high risk for hepatitis B if:  You were born in a country where hepatitis B is common. Ask your health care provider which countries are considered high risk.  Your parents were born in a high-risk country, and you have not been immunized against hepatitis B (hepatitis B vaccine).  You have HIV or AIDS.  You use needles to inject street drugs.  You live with someone who has hepatitis B.  You have had sex with someone who has hepatitis B.  You get hemodialysis treatment.  You take certain medicines for conditions, including cancer, organ transplantation, and autoimmune conditions. Hepatitis C  Blood testing is recommended for:  Everyone born from [birth year] through 1963.  Anyone with known risk factors for hepatitis C. Sexually transmitted infections (STIs)  You should be screened for sexually transmitted infections (STIs) including gonorrhea and chlamydia if:  You are sexually active and are younger than 80 years of age.  You are older than 80 years of age and your health care provider tells you that you are at risk for this type of infection.  Your sexual activity has changed since you were last screened and you are at an increased risk for chlamydia or gonorrhea. Ask your health care provider if you are at risk.  If you do not have HIV, but are at risk, it may be recommended that you take a prescription medicine daily to prevent HIV infection. This is called pre-exposure prophylaxis  (PrEP). You are considered at risk if:  You are sexually active and do not regularly use condoms or know the HIV status of your partner(s).  You take drugs by injection.  You are sexually active with a partner who has HIV. Talk with your health care provider about whether you are at high risk of being infected with HIV. If you choose to begin PrEP, you should first be tested for HIV. You should then be tested every  3 months for as long as you are taking PrEP.  PREGNANCY   If you are premenopausal and you may become pregnant, ask your health care provider about preconception counseling.  If you may become pregnant, take 400 to 800 micrograms (mcg) of folic acid every day.  If you want to prevent pregnancy, talk to your health care provider about birth control (contraception). OSTEOPOROSIS AND MENOPAUSE   Osteoporosis is a disease in which the bones lose minerals and strength with aging. This can result in serious bone fractures. Your risk for osteoporosis can be identified using a bone density scan.  If you are 80 years of age or older, or if you are at risk for osteoporosis and fractures, ask your health care provider if you should be screened.  Ask your health care provider whether you should take a calcium or vitamin D supplement to lower your risk for osteoporosis.  Menopause may have certain physical symptoms and risks.  Hormone replacement therapy may reduce some of these symptoms and risks. Talk to your health care provider about whether hormone replacement therapy is right for you.  HOME CARE INSTRUCTIONS   Schedule regular health, dental, and eye exams.  Stay current with your immunizations.   Do not use any tobacco products including cigarettes, chewing tobacco, or electronic cigarettes.  If you are pregnant, do not drink alcohol.  If you are breastfeeding, limit how much and how often you drink alcohol.  Limit alcohol intake to no more than 1 drink per day for  nonpregnant women. One drink equals 12 ounces of beer, 5 ounces of wine, or 1 ounces of hard liquor.  Do not use street drugs.  Do not share needles.  Ask your health care provider for help if you need support or information about quitting drugs.  Tell your health care provider if you often feel depressed.  Tell your health care provider if you have ever been abused or do not feel safe at home.   This information is not intended to replace advice given to you by your health care provider. Make sure you discuss any questions you have with your health care provider.   Document Released: 10/26/2010 Document Revised: 05/03/2014 Document Reviewed: 03/14/2013 Elsevier Interactive Patient Education [employer].

## 2015-10-07 NOTE — Assessment & Plan Note (Signed)
Chronic, stable. Continue current regimen. 

## 2015-10-07 NOTE — Assessment & Plan Note (Signed)
Encouraged restart calcium.

## 2015-10-07 NOTE — Assessment & Plan Note (Signed)

## 2015-10-07 NOTE — Assessment & Plan Note (Signed)
Encouraged with improvement noted over the past year.

## 2015-10-07 NOTE — Assessment & Plan Note (Signed)
Continue daily colchicine.

## 2015-10-07 NOTE — Assessment & Plan Note (Signed)
Intermittent flares.

## 2015-10-07 NOTE — Assessment & Plan Note (Addendum)
Chronic, stable. Continue pravastatin and fish oil.  

## 2015-10-07 NOTE — Assessment & Plan Note (Signed)
Stable. presumed aortic sclerosis. Known MR.

## 2015-10-07 NOTE — Progress Notes (Signed)
BP 124/84 mmHg  Pulse 76  Temp(Src) 97.9 F (36.6 C) (Oral)  Ht 5\' 2"  (1.575 m)  Wt 154 lb 8 oz (70.081 kg)  BMI 28.25 kg/m2   CC: medicare wellness visit  Subjective:    Patient ID: Brooke Beasley, female    DOB: [birth year], 82 y.o.   MRN: [id number]  HPI: Brooke Beasley is a 82 y.o. female presenting on 10/07/2015 for Annual Exam   Stressful year - caring for husband with end stage dementia.  She has been taking colchicine daily for ongoing shoulder pain L>R, h/o pseudogout and arthritis. Sees [facility].   Hearing aides used. Vision screen at eye doctor's office yearly. H/o glaucoma No falls in last year. + depression under treatment.PHQ2 = 1 today.Main concern is anxiety. On sertraline 100mg  daily + klonopin 0.5mg  nightly.   Preventative: Colonoscopy 2004 - severe sigmoid diverticulosis. No recent blood in stool. Taking probiotics for IBS. Barium enema Date: 2012 severe diverticulosis, redundant colon.  DEXA - 2011 osteoporosis. Pt does not take calcium but does take vit D. Declines rpt DEXA. Fosamax caused bone pain. Well woman - last done about 12 yrs ago. Mammogram 03/2014 - WNL Flu shot - declines Pneumovax - 2004. prevnar 05/2013.  Td 2010 zostavax 2012 Advanced directives - discussed. HC proxy is daughters [name] and other. Has living will at home. Forgot to bring in.  Seat belt use discussed Sunscreen use discussed. No changing moles on skin  Lives and cares for husband with dementia. 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 Interim medical history since our last visit reviewed. Allergies and medications reviewed and updated. Current Outpatient Prescriptions on File Prior to Visit  Medication Sig  . acetaminophen (TYLENOL) 500 MG tablet  Take 1,000 mg by mouth 2 (two) times daily.  Marland Kitchen amLODipine (NORVASC) 5 MG tablet Take 1 tablet (5 mg total) by mouth daily.  . colchicine 0.6 MG tablet Take 1 tablet (0.6 mg total) by mouth daily as needed.  . fish oil-omega-3 fatty acids 1000 MG capsule Take 2 g by mouth 3 (three) times a week.  . levothyroxine (SYNTHROID, LEVOTHROID) 75 MCG tablet Take 1 tablet (75 mcg total) by mouth daily.  . Misc Natural Products (OSTEO BI-FLEX TRIPLE STRENGTH PO) Take 1 tablet by mouth daily. With Vit D  . pantoprazole (PROTONIX) 40 MG tablet TAKE ONE TABLET BY MOUTH ONCE DAILY  . polyethylene glycol powder (MIRALAX) powder Take by mouth. OTC as directed.  . pravastatin (PRAVACHOL) 20 MG tablet Take 1 tablet (20 mg total) by mouth daily.  . Probiotic Product (PROBIOTIC DAILY PO) Take 1 capsule by mouth daily.   . sertraline (ZOLOFT) 100 MG tablet Take 1 tablet (100 mg total) by mouth daily.  . VOLTAREN 1 % GEL APPLY ONE APPLICATION TOPICALLY 3 TIMES DAILY  . BIOTIN PO Take by mouth daily. Reported on 10/07/2015   No current facility-administered medications on file prior to visit.    Review of Systems  Constitutional: Negative for fever, chills, activity change, appetite change, fatigue and unexpected weight change.  HENT: Negative for hearing loss.   Eyes: Negative for visual disturbance.  Respiratory: Negative for cough, chest tightness, shortness of breath and wheezing.   Cardiovascular: Negative for chest pain, palpitations and leg swelling.  Gastrointestinal: Positive for abdominal pain. Negative for  nausea, vomiting, diarrhea, constipation, blood in stool and abdominal distention.  Genitourinary: Negative for hematuria and difficulty urinating.  Musculoskeletal: Negative for myalgias, arthralgias and neck pain.  Skin: Negative for rash.  Neurological: Negative for dizziness, seizures, syncope and headaches.  Hematological: Negative for adenopathy. Does not bruise/bleed easily.    Psychiatric/Behavioral: Negative for dysphoric mood. The patient is not nervous/anxious.    Per HPI unless specifically indicated in ROS section     Objective:    BP 124/84 mmHg  Pulse 76  Temp(Src) 97.9 F (36.6 C) (Oral)  Ht 5\' 2"  (1.575 m)  Wt 154 lb 8 oz (70.081 kg)  BMI 28.25 kg/m2  Wt Readings from Last 3 Encounters:  10/07/15 154 lb 8 oz (70.081 kg)  04/03/15 155 lb 8 oz (70.534 kg)  12/19/14 157 lb (71.215 kg)    Physical Exam  Constitutional: She is oriented to person, place, and time. She appears well-developed and well-nourished. No distress.  HENT:  Head: Normocephalic and atraumatic.  Right Ear: Hearing, tympanic membrane, external ear and ear canal normal.  Left Ear: Hearing, tympanic membrane, external ear and ear canal normal.  Nose: Nose normal.  Mouth/Throat: Uvula is midline, oropharynx is clear and moist and mucous membranes are normal. No oropharyngeal exudate, posterior oropharyngeal edema or posterior oropharyngeal erythema.  Eyes: Conjunctivae and EOM are normal. Pupils are equal, round, and reactive to light. No scleral icterus.  Neck: Normal range of motion. Neck supple. Carotid bruit is not present. No thyromegaly present.  Cardiovascular: Normal rate, regular rhythm and intact distal pulses.   Murmur (3/6 SEM RUSB) heard. Pulses:      Radial pulses are 2+ on the right side, and 2+ on the left side.  Pulmonary/Chest: Effort normal and breath sounds normal. No respiratory distress. She has no wheezes. She has no rales. Right breast exhibits no inverted nipple, no mass, no nipple discharge, no skin change and no tenderness. Left breast exhibits no inverted nipple, no mass, no nipple discharge, no skin change and no tenderness.  Abdominal: Soft. Bowel sounds are normal. She exhibits no distension and no mass. There is no tenderness. There is no rebound and no guarding.  Musculoskeletal: Normal range of motion. She exhibits no edema.  Lymphadenopathy:        Head (right side): No submental, no submandibular, no tonsillar, no preauricular and no posterior auricular adenopathy present.       Head (left side): No submental, no submandibular, no tonsillar, no preauricular and no posterior auricular adenopathy present.    She has no cervical adenopathy.    She has no axillary adenopathy.       Right axillary: No lateral adenopathy present.       Left axillary: No lateral adenopathy present.      Right: No supraclavicular adenopathy present.       Left: No supraclavicular adenopathy present.  Neurological: She is alert and oriented to person, place, and time.  CN grossly intact, station and gait intact Recall 3/3  Calculation 3/5 D-L-O-R-W  Skin: Skin is warm and dry. No rash noted.  Psychiatric: She has a normal mood and affect. Her behavior is normal. Judgment and thought content normal.  Nursing note and vitals reviewed.  Results for orders placed or performed in visit on 09/25/15  Lipid panel  Result Value Ref Range   Cholesterol 171 0 - 200 mg/dL   Triglycerides 110.0 0.0 - 149.0 mg/dL   HDL 68.30 >39.00 mg/dL   VLDL 22.0 0.0 -  40.0 mg/dL   LDL Cholesterol 81 0 - 99 mg/dL   Total CHOL/HDL Ratio 3    NonHDL 103.18   Renal function panel  Result Value Ref Range   Sodium 139 135 - 145 mEq/L   Potassium 4.2 3.5 - 5.1 mEq/L   Chloride 102 96 - 112 mEq/L   CO2 30 19 - 32 mEq/L   Calcium 9.6 8.4 - 10.5 mg/dL   Albumin 4.3 3.5 - 5.2 g/dL   BUN 14 6 - 23 mg/dL   Creatinine, Ser 0.95 0.40 - 1.20 mg/dL   Glucose, Bld 100 (H) 70 - 99 mg/dL   Phosphorus 3.2 2.3 - 4.6 mg/dL   GFR 59.73 (L) >60.00 mL/min  TSH  Result Value Ref Range   TSH 3.87 0.35 - 4.50 uIU/mL  Hemoglobin A1c  Result Value Ref Range   Hgb A1c MFr Bld 6.0 4.6 - 6.5 %  CBC with Differential/Platelet  Result Value Ref Range   WBC 5.7 4.0 - 10.5 K/uL   RBC 4.45 3.87 - 5.11 Mil/uL   Hemoglobin 13.8 12.0 - 15.0 g/dL   HCT 41.1 36.0 - 46.0 %   MCV 92.3 78.0 - 100.0 fl    MCHC 33.5 30.0 - 36.0 g/dL   RDW 14.2 11.5 - 15.5 %   Platelets 236.0 150.0 - 400.0 K/uL   Neutrophils Relative % 51.7 43.0 - 77.0 %   Lymphocytes Relative 32.5 12.0 - 46.0 %   Monocytes Relative 11.6 3.0 - 12.0 %   Eosinophils Relative 3.8 0.0 - 5.0 %   Basophils Relative 0.4 0.0 - 3.0 %   Neutro Abs 2.9 1.4 - 7.7 K/uL   Lymphs Abs 1.8 0.7 - 4.0 K/uL   Monocytes Absolute 0.7 0.1 - 1.0 K/uL   Eosinophils Absolute 0.2 0.0 - 0.7 K/uL   Basophils Absolute 0.0 0.0 - 0.1 K/uL  Microalbumin / creatinine urine ratio  Result Value Ref Range   Microalb, Ur 1.2 0.0 - 1.9 mg/dL   Creatinine,U 156.2 mg/dL   Microalb Creat Ratio 0.8 0.0 - 30.0 mg/g  VITAMIN D 25 Hydroxy (Vit-D Deficiency, Fractures)  Result Value Ref Range   VITD 37.14 30.00 - 100.00 ng/mL      Assessment & Plan:   Problem List Items Addressed This Visit    Hypothyroidism    Chronic, stable. Continue levothyroxine.       HLD (hyperlipidemia)    Chronic, stable. Continue pravastatin and fish oil.      HYPERTENSION, BENIGN    Chronic, stable. Continue current regimen.      Irritable bowel syndrome with constipation    Intermittent flares.       Osteoarthritis    Continue osteo biflex with vit D.       Osteoporosis    Encouraged restart calcium.       Relevant Medications   cholecalciferol (VITAMIN D) 1000 units tablet   Heart murmur    Stable. presumed aortic sclerosis. Known MR.       Prediabetes    Encouraged avoiding added sugars in diet.      Medicare annual wellness visit, subsequent - Primary    I have personally reviewed the Medicare Annual Wellness questionnaire and have noted 1. The patient's medical and social history 2. Their use of alcohol, tobacco or illicit drugs 3. Their current medications and supplements 4. The patient's functional ability including ADL's, fall risks, home safety risks and hearing or visual impairment. Cognitive function has been assessed and addressed as  indicated.    5. Diet and physical activity 6. Evidence for depression or mood disorders The patients weight, height, BMI have been recorded in the chart. I have made referrals, counseling and provided education to the patient based on review of the above and I have provided the pt with a written personalized care plan for preventive services. Provider list updated.. See scanned questionairre as needed for further documentation. Reviewed preventative protocols and updated unless pt declined.       CKD (chronic kidney disease) stage 3, GFR 30-59 ml/min    Encouraged with improvement noted over the past year.      Advanced care planning/counseling discussion    Advanced directives - discussed. HC proxy is daughters [name] and other. Has living will at home. Forgot to bring in.       Left shoulder pain    Ongoing - seen by orthopedist. Recommended shoulder replacement. Discussed will need preop clearance if desires to pursue.      Pseudogout    Continue daily colchicine.       Health maintenance examination    Preventative protocols reviewed and updated unless pt declined. Discussed healthy diet and lifestyle.           Follow up plan: Return in about 1 year (around 10/06/2016) for medicare wellness visit.  [name], MD

## 2015-10-07 NOTE — Progress Notes (Signed)
Pre visit review using our clinic review tool, if applicable. No additional management support is needed unless otherwise documented below in the visit note. 

## 2015-10-07 NOTE — Assessment & Plan Note (Signed)
Advanced directives - discussed. HC proxy is daughters [name] and other. Has living will at home. Forgot to bring in.

## 2015-10-07 NOTE — Assessment & Plan Note (Addendum)
Encouraged avoiding added sugars in diet.  

## 2015-10-07 NOTE — Assessment & Plan Note (Signed)
Preventative protocols reviewed and updated unless pt declined. Discussed healthy diet and lifestyle.  

## 2015-10-07 NOTE — Assessment & Plan Note (Signed)
Chronic, stable. Continue levothyroxine. 

## 2015-10-07 NOTE — Assessment & Plan Note (Signed)
Continue osteo biflex with vit D.

## 2015-10-23 DIAGNOSIS — M11212 Other chondrocalcinosis, left shoulder: Secondary | ICD-10-CM | POA: Diagnosis not present

## 2015-10-23 DIAGNOSIS — M7582 Other shoulder lesions, left shoulder: Secondary | ICD-10-CM | POA: Diagnosis not present

## 2015-10-23 DIAGNOSIS — M19012 Primary osteoarthritis, left shoulder: Secondary | ICD-10-CM | POA: Diagnosis not present

## 2015-11-06 ENCOUNTER — Ambulatory Visit: Payer: PPO | Admitting: Family Medicine

## 2015-11-07 ENCOUNTER — Encounter: Payer: Self-pay | Admitting: Family Medicine

## 2015-11-07 ENCOUNTER — Ambulatory Visit (INDEPENDENT_AMBULATORY_CARE_PROVIDER_SITE_OTHER): Payer: PPO | Admitting: Family Medicine

## 2015-11-07 VITALS — BP 130/80 | HR 85 | Ht 62.0 in | Wt 155.8 lb

## 2015-11-07 DIAGNOSIS — I10 Essential (primary) hypertension: Secondary | ICD-10-CM

## 2015-11-07 DIAGNOSIS — M150 Primary generalized (osteo)arthritis: Secondary | ICD-10-CM

## 2015-11-07 DIAGNOSIS — M159 Polyosteoarthritis, unspecified: Secondary | ICD-10-CM

## 2015-11-07 DIAGNOSIS — E039 Hypothyroidism, unspecified: Secondary | ICD-10-CM

## 2015-11-07 DIAGNOSIS — N183 Chronic kidney disease, stage 3 unspecified: Secondary | ICD-10-CM

## 2015-11-07 DIAGNOSIS — I6529 Occlusion and stenosis of unspecified carotid artery: Secondary | ICD-10-CM | POA: Diagnosis not present

## 2015-11-07 DIAGNOSIS — M1180 Other specified crystal arthropathies, unspecified site: Secondary | ICD-10-CM

## 2015-11-07 DIAGNOSIS — M25512 Pain in left shoulder: Secondary | ICD-10-CM

## 2015-11-07 DIAGNOSIS — Z01818 Encounter for other preprocedural examination: Secondary | ICD-10-CM | POA: Diagnosis not present

## 2015-11-07 DIAGNOSIS — R7303 Prediabetes: Secondary | ICD-10-CM

## 2015-11-07 DIAGNOSIS — E785 Hyperlipidemia, unspecified: Secondary | ICD-10-CM

## 2015-11-07 DIAGNOSIS — M1120 Other chondrocalcinosis, unspecified site: Secondary | ICD-10-CM

## 2015-11-07 MED ORDER — CLONAZEPAM 0.5 MG PO TABS: 0.5000 mg | ORAL_TABLET | Freq: Every evening | ORAL | Status: DC | PRN

## 2015-11-07 MED ORDER — PRAVASTATIN SODIUM 20 MG PO TABS: 20.0000 mg | ORAL_TABLET | Freq: Every day | ORAL | Status: DC

## 2015-11-07 NOTE — Progress Notes (Signed)
BP 130/80 mmHg  Pulse 85  Ht 5\' 2"  (1.575 m)  Wt 155 lb 12.8 oz (70.67 kg)  BMI 28.49 kg/m2  SpO2 96%   CC: preop evaluation  Subjective:    Patient ID: Brooke Beasley, female    DOB: 03/21/1931, 82 y.o.   MRN: [id number]  HPI: Brooke Beasley is a 82 y.o. female presenting on 11/07/2015 for Medical Clearance and Medication Refill   Upcoming total L shoulder replacement by Dr Roland Rack at [facility] scheduled for August 3rd. Severe osteoarthritis and h/o pseudogout. Activity limiting pain from shoulder joint pathology.   No h/o prior surgeries. Has never had GETA. Family very interested in local anesthesia pump to avoid GETA. They will discuss this with anesthesia.  Not on any blood thinner, not even aspirin.  Able to walk up 1 flight of stairs without dyspnea or stopping to rest. No trouble walking grocery store.   Needs pravastatin and klonopin refilled.  She lives with husband who has dementia. She is his primary caregiver but supportive family lives nearby and they do have personal aide that helps as well. She is working towards getting home hospice involved to help care for her husband in anticipation of upcoming surgery.  Latest echo [id number] - mild diastolic dysfunction, EF 0000000, focal basal septal hypertrophy, mild MR.   Known carotid stenosis R 1-39%, L 40-59% 09/2014. Continues statin regularly.   Relevant past medical, surgical, family and social history reviewed and updated as indicated. Interim medical history since our last visit reviewed. Allergies and medications reviewed and updated. Current Outpatient Prescriptions on File Prior to Visit  Medication Sig  . acetaminophen (TYLENOL) 500 MG tablet Take 1,000 mg by mouth 2 (two) times daily.  Marland Kitchen amLODipine (NORVASC) 5 MG tablet Take 1 tablet (5 mg total) by mouth daily.  . bimatoprost (LUMIGAN) 0.01 % SOLN Place 1 drop into both eyes at bedtime.  Marland Kitchen BIOTIN PO Take by mouth daily. Reported on 10/07/2015  .  cholecalciferol (VITAMIN D) 1000 units tablet Take 1,000 Units by mouth daily.  . colchicine 0.6 MG tablet Take 1 tablet (0.6 mg total) by mouth daily as needed.  . fish oil-omega-3 fatty acids 1000 MG capsule Take 2 g by mouth 3 (three) times a week.  . levothyroxine (SYNTHROID, LEVOTHROID) 75 MCG tablet Take 1 tablet (75 mcg total) by mouth daily.  . Misc Natural Products (OSTEO BI-FLEX TRIPLE STRENGTH PO) Take 1 tablet by mouth daily. With Vit D  . pantoprazole (PROTONIX) 40 MG tablet TAKE ONE TABLET BY MOUTH ONCE DAILY  . polyethylene glycol powder (MIRALAX) powder Take by mouth. OTC as directed.  . Probiotic Product (PROBIOTIC DAILY PO) Take 1 capsule by mouth daily.   . sertraline (ZOLOFT) 100 MG tablet Take 1 tablet (100 mg total) by mouth daily.  . VOLTAREN 1 % GEL APPLY ONE APPLICATION TOPICALLY 3 TIMES DAILY   No current facility-administered medications on file prior to visit.   Past Medical History  Diagnosis Date  . HTN (hypertension)   . Anxiety   . Depression     nervous breakdonw in 1964-out of work for a year  . Hyperlipidemia   . Hypothyroidism   . Osteoporosis     dexa 2011  . History of shingles   . Intestinal bacterial overgrowth     In small colon  . Degenerative disc disease     LS  . Heart murmur 5/13    mitral regurge - on echo   .  GERD (gastroesophageal reflux disease)   . Glaucoma   . Diverticulosis     severe by colonoscopy  . IBS 11/02/2006  . CKD (chronic kidney disease) stage 3, GFR 30-59 ml/min 08/02/2014  . Carotid stenosis 10/18/2014    R 1-39%, L 40-59%, rpt 1 yr (09/2014)   . Pseudogout 2016    shoulders ([facility]gi)  . Osteoarthritis 2016    [facility])  . Maxillary fracture ([place]) 04/08/2012    Past Surgical History  Procedure Laterality Date  . Tonsillectomy    . Bowel obstruction  1999  . Colonoscopy  1. 1999  2. 11/04    1. Not finished  2. Slight hemorrhage rectosigmoid area, severe sig diverticulosis  . Dexa  1. [id number]   2. 9/04  3.  3/08     1. OP  2. OP, borderline, spine -2.44T  3. decreased BMD-OP  . Esophagogastroduodenoscopy      Negative  . Hemorrhoid procedure  4/08  . Dg knee 1-2 views bilat      LS x-ray with degenerative disc and facet change  . US echocardiography  [id number]    Normal systolic fxn with EF 0000000.  Focal basal septal hypertrophy.  Mild diastolic dysfunction.  Mild MR.  Marland Kitchen Dexa  2011    OP in spine  . Barium enema  2012    severe diverticulosis, redundant colon    Family History  Problem Relation Age of Onset  . Diabetes Brother     post-op  . Coronary artery disease Brother   . Diabetes Paternal Grandfather    Social History  Substance Use Topics  . Smoking status: Never Smoker   . Smokeless tobacco: Never Used  . Alcohol Use: No    Review of Systems Per HPI unless specifically indicated in ROS section     Objective:    BP 130/80 mmHg  Pulse 85  Ht 5\' 2"  (1.575 m)  Wt 155 lb 12.8 oz (70.67 kg)  BMI 28.49 kg/m2  SpO2 96%  Wt Readings from Last 3 Encounters:  11/07/15 155 lb 12.8 oz (70.67 kg)  10/07/15 154 lb 8 oz (70.081 kg)  04/03/15 155 lb 8 oz (70.534 kg)    Physical Exam  Constitutional: She appears well-developed and well-nourished. No distress.  HENT:  Mouth/Throat: Oropharynx is clear and moist. No oropharyngeal exudate.  Cardiovascular: Normal rate, regular rhythm, normal heart sounds and intact distal pulses.   No murmur heard. Pulmonary/Chest: Effort normal and breath sounds normal. No respiratory distress. She has no wheezes. She has no rales.  Musculoskeletal: She exhibits no edema.  L shoulder brace in place  Skin: Skin is warm and dry. No rash noted.  Psychiatric: She has a normal mood and affect.  Nursing note and vitals reviewed.  Results for orders placed or performed in visit on 09/25/15  Lipid panel  Result Value Ref Range   Cholesterol 171 0 - 200 mg/dL   Triglycerides 110.0 0.0 - 149.0 mg/dL   HDL 68.30 >39.00 mg/dL   VLDL 22.0 0.0 - 40.0  mg/dL   LDL Cholesterol 81 0 - 99 mg/dL   Total CHOL/HDL Ratio 3    NonHDL 103.18   Renal function panel  Result Value Ref Range   Sodium 139 135 - 145 mEq/L   Potassium 4.2 3.5 - 5.1 mEq/L   Chloride 102 96 - 112 mEq/L   CO2 30 19 - 32 mEq/L   Calcium 9.6 8.4 - 10.5 mg/dL   Albumin 4.3 3.5 -  5.2 g/dL   BUN 14 6 - 23 mg/dL   Creatinine, Ser 0.95 0.40 - 1.20 mg/dL   Glucose, Bld 100 (H) 70 - 99 mg/dL   Phosphorus 3.2 2.3 - 4.6 mg/dL   GFR 59.73 (L) >60.00 mL/min  TSH  Result Value Ref Range   TSH 3.87 0.35 - 4.50 uIU/mL  Hemoglobin A1c  Result Value Ref Range   Hgb A1c MFr Bld 6.0 4.6 - 6.5 %  CBC with Differential/Platelet  Result Value Ref Range   WBC 5.7 4.0 - 10.5 K/uL   RBC 4.45 3.87 - 5.11 Mil/uL   Hemoglobin 13.8 12.0 - 15.0 g/dL   HCT 41.1 36.0 - 46.0 %   MCV 92.3 78.0 - 100.0 fl   MCHC 33.5 30.0 - 36.0 g/dL   RDW 14.2 11.5 - 15.5 %   Platelets 236.0 150.0 - 400.0 K/uL   Neutrophils Relative % 51.7 43.0 - 77.0 %   Lymphocytes Relative 32.5 12.0 - 46.0 %   Monocytes Relative 11.6 3.0 - 12.0 %   Eosinophils Relative 3.8 0.0 - 5.0 %   Basophils Relative 0.4 0.0 - 3.0 %   Neutro Abs 2.9 1.4 - 7.7 K/uL   Lymphs Abs 1.8 0.7 - 4.0 K/uL   Monocytes Absolute 0.7 0.1 - 1.0 K/uL   Eosinophils Absolute 0.2 0.0 - 0.7 K/uL   Basophils Absolute 0.0 0.0 - 0.1 K/uL  Microalbumin / creatinine urine ratio  Result Value Ref Range   Microalb, Ur 1.2 0.0 - 1.9 mg/dL   Creatinine,U 156.2 mg/dL   Microalb Creat Ratio 0.8 0.0 - 30.0 mg/g  VITAMIN D 25 Hydroxy (Vit-D Deficiency, Fractures)  Result Value Ref Range   VITD 37.14 30.00 - 100.00 ng/mL      Assessment & Plan:   Problem List Items Addressed This Visit    Hypothyroidism   HLD (hyperlipidemia)   Relevant Medications   pravastatin (PRAVACHOL) 20 MG tablet   HYPERTENSION, BENIGN   Relevant Medications   pravastatin (PRAVACHOL) 20 MG tablet   Osteoarthritis   Prediabetes   CKD (chronic kidney disease) stage 3, GFR  30-59 ml/min   Carotid stenosis    Due for rpt carotid US - will order. Surgery does not need to be delayed.       Relevant Medications   pravastatin (PRAVACHOL) 20 MG tablet   Other Relevant Orders   VAS US CAROTID   Left shoulder pain   Pseudogout   Preoperative evaluation to rule out surgical contraindication - Primary    Presents today for preoperative evaluation for upcoming total L shoulder replacement for severe osteoarthritis and pseudogout. No prior history of surgery requiring general endotracheal anesthesia. They will discuss options with anesthesia. Using the revised cardiac risk index for preoperative risk she is low risk (class I risk). No further evaluation is needed and she should be adequately low risk to proceed with surgery. I will forward today's note to Dr Nicholaus Bloom office.  EKG NSR rate of 70 with normal axis, intervals, no hypertrophy or acute ST/T changes.       Relevant Orders   EKG 12-Lead (Completed)       Follow up plan: Return as needed.  [name], MD

## 2015-11-07 NOTE — Patient Instructions (Signed)
EKG today Good luck on upcoming surgery. I will send today's note to Dr Roland Rack.

## 2015-11-09 ENCOUNTER — Encounter: Payer: Self-pay | Admitting: Family Medicine

## 2015-11-09 ENCOUNTER — Telehealth: Payer: Self-pay | Admitting: Family Medicine

## 2015-11-09 DIAGNOSIS — Z01818 Encounter for other preprocedural examination: Secondary | ICD-10-CM | POA: Insufficient documentation

## 2015-11-09 NOTE — Assessment & Plan Note (Addendum)
Due for rpt carotid US - will order. Surgery does not need to be delayed.

## 2015-11-09 NOTE — Telephone Encounter (Signed)
plz notify patient - I have ordered f/u carotid US. We will call to schedule if able prior to surgery, but if unable to, does not need to delay surgery. plz fax recent office note and EKG attn Dr Roland Rack ortho.

## 2015-11-09 NOTE — Assessment & Plan Note (Signed)
Presents today for preoperative evaluation for upcoming total L shoulder replacement for severe osteoarthritis and pseudogout. No prior history of surgery requiring general endotracheal anesthesia. They will discuss options with anesthesia. Using the revised cardiac risk index for preoperative risk she is low risk (class I risk). No further evaluation is needed and she should be adequately low risk to proceed with surgery. I will forward today's note to Dr Nicholaus Bloom office.  EKG NSR rate of 70 with normal axis, intervals, no hypertrophy or acute ST/T changes.

## 2015-11-11 NOTE — Telephone Encounter (Signed)
Patient notified and notes/EKG faxed as directed.

## 2015-11-13 ENCOUNTER — Ambulatory Visit: Payer: PPO

## 2015-11-13 DIAGNOSIS — I6529 Occlusion and stenosis of unspecified carotid artery: Secondary | ICD-10-CM

## 2015-11-17 ENCOUNTER — Encounter
Admission: RE | Admit: 2015-11-17 | Discharge: 2015-11-17 | Disposition: A | Discharge status: Home / Self Care | Payer: PPO | Source: Ambulatory Visit | Attending: Surgery | Admitting: Surgery

## 2015-11-17 DIAGNOSIS — Z01812 Encounter for preprocedural laboratory examination: Secondary | ICD-10-CM | POA: Insufficient documentation

## 2015-11-17 HISTORY — DX: Personal history of other diseases of the digestive system: Z87.19

## 2015-11-17 HISTORY — DX: Family history of other specified conditions: Z84.89

## 2015-11-17 LAB — TYPE AND SCREEN
ABO/RH(D): O NEG
Antibody Screen: NEGATIVE

## 2015-11-17 LAB — BASIC METABOLIC PANEL
Anion gap: 9 (ref 5–15)
BUN: 22 mg/dL — AB (ref 6–20)
CALCIUM: 9.9 mg/dL (ref 8.9–10.3)
CHLORIDE: 94 mmol/L — AB (ref 101–111)
CO2: 29 mmol/L (ref 22–32)
CREATININE: 1.17 mg/dL — AB (ref 0.44–1.00)
GFR, EST AFRICAN AMERICAN: 49 mL/min — AB (ref >60)
GFR, EST NON AFRICAN AMERICAN: 42 mL/min — AB (ref >60)
Glucose, Bld: 95 mg/dL (ref 65–99)
Potassium: 3.7 mmol/L (ref 3.5–5.1)
SODIUM: 132 mmol/L — AB (ref 135–145)

## 2015-11-17 LAB — MRSA PCR SCREENING: MRSA BY PCR: NEGATIVE

## 2015-11-17 LAB — CBC
HCT: 45.7 % (ref 35.0–47.0)
Hemoglobin: 15 g/dL (ref 12.0–16.0)
MCH: 30.5 pg (ref 26.0–34.0)
MCHC: 32.8 g/dL (ref 32.0–36.0)
MCV: 93 fL (ref 80.0–100.0)
PLATELETS: 283 10*3/uL (ref 150–440)
RBC: 4.92 MIL/uL (ref 3.80–5.20)
RDW: 14.2 % (ref 11.5–14.5)
WBC: 9.2 10*3/uL (ref 3.6–11.0)

## 2015-11-17 LAB — URINALYSIS COMPLETE WITH MICROSCOPIC (ARMC ONLY)
BACTERIA UA: NONE SEEN
Bilirubin Urine: NEGATIVE
GLUCOSE, UA: NEGATIVE mg/dL
HGB URINE DIPSTICK: NEGATIVE
NITRITE: NEGATIVE
PH: 5 (ref 5.0–8.0)
Protein, ur: NEGATIVE mg/dL
SPECIFIC GRAVITY, URINE: 1.018 (ref 1.005–1.030)

## 2015-11-17 LAB — PROTIME-INR
INR: 0.9
PROTHROMBIN TIME: 12.4 s (ref 11.4–15.0)

## 2015-11-17 NOTE — Pre-Procedure Instructions (Signed)
CLEARED BY DR Danise Mina  11/07/15

## 2015-11-17 NOTE — Patient Instructions (Signed)
  Your procedure is scheduled on: 11/27/15 Report to Day Surgery. [facility]OND FLOOR To find out your arrival time please call [phone] between 1PM - 3PM on 11/26/15  Remember: Instructions that are not followed completely may result in serious medical risk, up to and including death, or upon the discretion of your surgeon and anesthesiologist your surgery may need to be rescheduled.    _X___ 1. Do not eat food or drink liquids after midnight. No gum chewing or hard candies.     __X__ 2. No Alcohol for 24 hours before or after surgery.   _X___ 3. Do Not Smoke For 24 Hours Prior to Your Surgery.   ___ 4. Bring all medications with you on the day of surgery if instructed.    __X__ 5. Notify your doctor if there is any change in your medical condition     (cold, fever, infections).       Do not wear jewelry, make-up, hairpins, clips or nail polish.  Do not wear lotions, powders, or perfumes. You may wear deodorant.  Do not shave 48 hours prior to surgery. Men may shave face and neck.  Do not bring valuables to the hospital.    [facility] is not responsible for any belongings or valuables.               Contacts, dentures or bridgework may not be worn into surgery.  Leave your suitcase in the car. After surgery it may be brought to your room.  For patients admitted to the hospital, discharge time is determined by your                treatment team.   Patients discharged the day of surgery will not be allowed to drive home.   Please read over the following fact sheets that you were given:   Surgical Site Infection Prevention /MRSA  __X__ Take these medicines the morning of surgery with A SIP OF WATER:    1.AMLODIPINE  2. LEVOTHYROXINE  3. SERTRALINE  4.  5.  6.  ____ Fleet Enema (as directed)   _X___ Use CHG Soap as directed  ____ Use inhalers on the day of surgery  ____ Stop metformin 2 days prior to surgery    ____ Take 1/2 of usual insulin dose the night  before surgery and none on the morning of surgery.   ____ Stop Coumadin/Plavix/aspirin on   ____ Stop Anti-inflammatories on    __X__ Stop supplements until after surgery.    STOP FISH OIL,OSTEBIFLEX,VIT E  ____ Bring C-Pap to the hospital.

## 2015-11-18 NOTE — Pre-Procedure Instructions (Addendum)
MRSA ONLY ORDERED / NOT STAPH. SPOKE WITH TIFFANY AT DR POGGI'S RE IF OK OR NEED TO HAVE PATIENT RETURN. Will do pcr screen am surgery by dr poggi'd ok

## 2015-11-19 LAB — URINE CULTURE
Culture: <10000 — AB
SPECIAL REQUESTS: NORMAL

## 2015-11-20 ENCOUNTER — Ambulatory Visit: Payer: PPO | Admitting: Family Medicine

## 2015-11-20 NOTE — Pre-Procedure Instructions (Signed)
Urine culture results faxed to Dr. Roland Rack.

## 2015-11-27 ENCOUNTER — Encounter: Payer: Self-pay | Admitting: *Deleted

## 2015-11-27 ENCOUNTER — Encounter
Admission: RE | Disposition: A | Discharge status: Home Health | Payer: Self-pay | Source: Ambulatory Visit | Attending: Surgery

## 2015-11-27 ENCOUNTER — Inpatient Hospital Stay: Payer: PPO | Admitting: Anesthesiology

## 2015-11-27 ENCOUNTER — Inpatient Hospital Stay: Payer: PPO

## 2015-11-27 ENCOUNTER — Inpatient Hospital Stay
Admission: RE | Admit: 2015-11-27 | Discharge: 2015-11-28 | DRG: 483 | Disposition: A | Discharge status: Home Health | Payer: PPO | Source: Ambulatory Visit | Attending: Surgery | Admitting: Surgery

## 2015-11-27 DIAGNOSIS — G2581 Restless legs syndrome: Secondary | ICD-10-CM | POA: Diagnosis not present

## 2015-11-27 DIAGNOSIS — M1990 Unspecified osteoarthritis, unspecified site: Secondary | ICD-10-CM | POA: Diagnosis not present

## 2015-11-27 DIAGNOSIS — Z79891 Long term (current) use of opiate analgesic: Secondary | ICD-10-CM | POA: Diagnosis not present

## 2015-11-27 DIAGNOSIS — H409 Unspecified glaucoma: Secondary | ICD-10-CM | POA: Diagnosis not present

## 2015-11-27 DIAGNOSIS — M19012 Primary osteoarthritis, left shoulder: Principal | ICD-10-CM | POA: Diagnosis present

## 2015-11-27 DIAGNOSIS — F329 Major depressive disorder, single episode, unspecified: Secondary | ICD-10-CM | POA: Diagnosis present

## 2015-11-27 DIAGNOSIS — E039 Hypothyroidism, unspecified: Secondary | ICD-10-CM | POA: Diagnosis present

## 2015-11-27 DIAGNOSIS — Z96612 Presence of left artificial shoulder joint: Secondary | ICD-10-CM

## 2015-11-27 DIAGNOSIS — M7582 Other shoulder lesions, left shoulder: Secondary | ICD-10-CM | POA: Diagnosis not present

## 2015-11-27 DIAGNOSIS — I129 Hypertensive chronic kidney disease with stage 1 through stage 4 chronic kidney disease, or unspecified chronic kidney disease: Secondary | ICD-10-CM | POA: Diagnosis not present

## 2015-11-27 DIAGNOSIS — F419 Anxiety disorder, unspecified: Secondary | ICD-10-CM | POA: Diagnosis present

## 2015-11-27 DIAGNOSIS — Z79899 Other long term (current) drug therapy: Secondary | ICD-10-CM

## 2015-11-27 DIAGNOSIS — M5136 Other intervertebral disc degeneration, lumbar region: Secondary | ICD-10-CM | POA: Diagnosis present

## 2015-11-27 DIAGNOSIS — E785 Hyperlipidemia, unspecified: Secondary | ICD-10-CM | POA: Diagnosis not present

## 2015-11-27 DIAGNOSIS — Z96619 Presence of unspecified artificial shoulder joint: Secondary | ICD-10-CM

## 2015-11-27 DIAGNOSIS — N183 Chronic kidney disease, stage 3 (moderate): Secondary | ICD-10-CM | POA: Diagnosis present

## 2015-11-27 DIAGNOSIS — Z882 Allergy status to sulfonamides status: Secondary | ICD-10-CM | POA: Diagnosis not present

## 2015-11-27 DIAGNOSIS — G8918 Other acute postprocedural pain: Secondary | ICD-10-CM | POA: Diagnosis not present

## 2015-11-27 DIAGNOSIS — Z471 Aftercare following joint replacement surgery: Secondary | ICD-10-CM | POA: Diagnosis not present

## 2015-11-27 DIAGNOSIS — Z888 Allergy status to other drugs, medicaments and biological substances status: Secondary | ICD-10-CM | POA: Diagnosis not present

## 2015-11-27 DIAGNOSIS — M810 Age-related osteoporosis without current pathological fracture: Secondary | ICD-10-CM | POA: Diagnosis present

## 2015-11-27 DIAGNOSIS — K219 Gastro-esophageal reflux disease without esophagitis: Secondary | ICD-10-CM | POA: Diagnosis present

## 2015-11-27 DIAGNOSIS — M25512 Pain in left shoulder: Secondary | ICD-10-CM | POA: Diagnosis not present

## 2015-11-27 HISTORY — PX: TOTAL SHOULDER ARTHROPLASTY: SHX126

## 2015-11-27 SURGERY — ARTHROPLASTY, SHOULDER, TOTAL
Anesthesia: Regional | Laterality: Left

## 2015-11-27 MED ORDER — METOCLOPRAMIDE HCL 5 MG/ML IJ SOLN
INTRAMUSCULAR | Status: DC | PRN
  Administered 2015-11-27: 10 mg via INTRAVENOUS

## 2015-11-27 MED ORDER — KCL IN DEXTROSE-NACL 20-5-0.9 MEQ/L-%-% IV SOLN
INTRAVENOUS | Status: DC
  Administered 2015-11-27 – 2015-11-28 (×2): via INTRAVENOUS
  Filled 2015-11-27 (×3): qty 1000

## 2015-11-27 MED ORDER — ONDANSETRON HCL 4 MG/2ML IJ SOLN
INTRAMUSCULAR | Status: DC | PRN
  Administered 2015-11-27: 4 mg via INTRAVENOUS

## 2015-11-27 MED ORDER — NEOMYCIN-POLYMYXIN B GU 40-200000 IR SOLN
Status: DC | PRN
  Administered 2015-11-27: 16 mL

## 2015-11-27 MED ORDER — BUPIVACAINE LIPOSOME 1.3 % IJ SUSP
INTRAMUSCULAR | Status: DC | PRN
  Administered 2015-11-27: 60 mL

## 2015-11-27 MED ORDER — GLYCOPYRROLATE 0.2 MG/ML IJ SOLN
INTRAMUSCULAR | Status: DC | PRN
  Administered 2015-11-27: 0.1 mg via INTRAVENOUS

## 2015-11-27 MED ORDER — DIPHENHYDRAMINE HCL 12.5 MG/5ML PO ELIX: 12.5000 mg | ORAL_SOLUTION | ORAL | Status: DC | PRN

## 2015-11-27 MED ORDER — HYDROCODONE-ACETAMINOPHEN 5-325 MG PO TABS
1.0000 | ORAL_TABLET | ORAL | Status: DC | PRN
  Filled 2015-11-27: qty 1

## 2015-11-27 MED ORDER — PANTOPRAZOLE SODIUM 40 MG PO TBEC
40.0000 mg | DELAYED_RELEASE_TABLET | Freq: Every day | ORAL | Status: DC
  Administered 2015-11-27 – 2015-11-28 (×2): 40 mg via ORAL
  Filled 2015-11-27 (×2): qty 1

## 2015-11-27 MED ORDER — METOCLOPRAMIDE HCL 10 MG PO TABS: 5.0000 mg | ORAL_TABLET | Freq: Three times a day (TID) | ORAL | Status: DC | PRN

## 2015-11-27 MED ORDER — FAMOTIDINE 20 MG PO TABS
20.0000 mg | ORAL_TABLET | Freq: Once | ORAL | Status: AC
  Administered 2015-11-27: 20 mg via ORAL

## 2015-11-27 MED ORDER — METOCLOPRAMIDE HCL 5 MG/ML IJ SOLN: 5.0000 mg | Freq: Three times a day (TID) | INTRAMUSCULAR | Status: DC | PRN

## 2015-11-27 MED ORDER — CLONAZEPAM 0.5 MG PO TABS: 0.5000 mg | ORAL_TABLET | Freq: Every evening | ORAL | Status: DC | PRN

## 2015-11-27 MED ORDER — DOCUSATE SODIUM 100 MG PO CAPS
100.0000 mg | ORAL_CAPSULE | Freq: Two times a day (BID) | ORAL | Status: DC
  Administered 2015-11-27 – 2015-11-28 (×3): 100 mg via ORAL
  Filled 2015-11-27 (×3): qty 1

## 2015-11-27 MED ORDER — ROCURONIUM BROMIDE 100 MG/10ML IV SOLN
INTRAVENOUS | Status: DC | PRN
  Administered 2015-11-27: 20 mg via INTRAVENOUS

## 2015-11-27 MED ORDER — BUPIVACAINE-EPINEPHRINE (PF) 0.5% -1:200000 IJ SOLN
INTRAMUSCULAR | Status: DC | PRN
  Administered 2015-11-27: 30 mL via PERINEURAL

## 2015-11-27 MED ORDER — POLYETHYLENE GLYCOL 3350 17 G PO PACK
17.0000 g | PACK | Freq: Every day | ORAL | Status: DC
  Administered 2015-11-27 – 2015-11-28 (×2): 17 g via ORAL
  Filled 2015-11-27 (×2): qty 1

## 2015-11-27 MED ORDER — BUPIVACAINE LIPOSOME 1.3 % IJ SUSP
INTRAMUSCULAR | Status: AC
  Filled 2015-11-27: qty 20

## 2015-11-27 MED ORDER — ACETAMINOPHEN 10 MG/ML IV SOLN
INTRAVENOUS | Status: AC
  Filled 2015-11-27: qty 100

## 2015-11-27 MED ORDER — TRANEXAMIC ACID 1000 MG/10ML IV SOLN
INTRAVENOUS | Status: DC | PRN
  Administered 2015-11-27: 1000 mg via INTRAVENOUS

## 2015-11-27 MED ORDER — ACETAMINOPHEN 500 MG PO TABS
1000.0000 mg | ORAL_TABLET | Freq: Four times a day (QID) | ORAL | Status: AC
  Administered 2015-11-27 – 2015-11-28 (×4): 1000 mg via ORAL
  Filled 2015-11-27 (×4): qty 2

## 2015-11-27 MED ORDER — MEPERIDINE HCL 25 MG/ML IJ SOLN: 6.2500 mg | INTRAMUSCULAR | Status: DC | PRN

## 2015-11-27 MED ORDER — SUCCINYLCHOLINE CHLORIDE 20 MG/ML IJ SOLN
INTRAMUSCULAR | Status: DC | PRN
  Administered 2015-11-27: 80 mg via INTRAVENOUS

## 2015-11-27 MED ORDER — ACETAMINOPHEN 325 MG PO TABS
650.0000 mg | ORAL_TABLET | Freq: Four times a day (QID) | ORAL | Status: DC | PRN
  Administered 2015-11-28: 650 mg via ORAL
  Filled 2015-11-27: qty 2

## 2015-11-27 MED ORDER — VITAMIN E 45 MG (100 UNIT) PO CAPS
100.0000 [IU] | ORAL_CAPSULE | Freq: Every day | ORAL | Status: DC
  Administered 2015-11-28: 100 [IU] via ORAL
  Filled 2015-11-27: qty 1

## 2015-11-27 MED ORDER — OXYCODONE HCL 5 MG/5ML PO SOLN: 5.0000 mg | Freq: Once | ORAL | Status: DC | PRN

## 2015-11-27 MED ORDER — ONDANSETRON HCL 4 MG/2ML IJ SOLN: 4.0000 mg | Freq: Four times a day (QID) | INTRAMUSCULAR | Status: DC | PRN

## 2015-11-27 MED ORDER — AMLODIPINE BESYLATE 5 MG PO TABS
5.0000 mg | ORAL_TABLET | Freq: Every day | ORAL | Status: DC
  Filled 2015-11-27: qty 1

## 2015-11-27 MED ORDER — FENTANYL CITRATE (PF) 100 MCG/2ML IJ SOLN: 25.0000 ug | INTRAMUSCULAR | Status: DC | PRN

## 2015-11-27 MED ORDER — LEVOTHYROXINE SODIUM 75 MCG PO TABS
75.0000 ug | ORAL_TABLET | Freq: Every day | ORAL | Status: DC
  Administered 2015-11-28: 75 ug via ORAL
  Filled 2015-11-27 (×2): qty 1

## 2015-11-27 MED ORDER — FENTANYL CITRATE (PF) 100 MCG/2ML IJ SOLN
INTRAMUSCULAR | Status: DC | PRN
  Administered 2015-11-27: 50 ug via INTRAVENOUS
  Administered 2015-11-27 (×4): 25 ug via INTRAVENOUS

## 2015-11-27 MED ORDER — BUPIVACAINE-EPINEPHRINE (PF) 0.5% -1:200000 IJ SOLN
INTRAMUSCULAR | Status: AC
  Filled 2015-11-27: qty 30

## 2015-11-27 MED ORDER — SODIUM CHLORIDE 0.9 % IJ SOLN
INTRAMUSCULAR | Status: AC
  Filled 2015-11-27: qty 50

## 2015-11-27 MED ORDER — OMEGA-3-ACID ETHYL ESTERS 1 G PO CAPS
2.0000 g | ORAL_CAPSULE | ORAL | Status: DC
  Administered 2015-11-28: 2 g via ORAL
  Filled 2015-11-27: qty 2

## 2015-11-27 MED ORDER — RISAQUAD PO CAPS
1.0000 | ORAL_CAPSULE | Freq: Every day | ORAL | Status: DC
  Administered 2015-11-27 – 2015-11-28 (×2): 1 via ORAL
  Filled 2015-11-27 (×2): qty 1

## 2015-11-27 MED ORDER — SERTRALINE HCL 100 MG PO TABS
100.0000 mg | ORAL_TABLET | Freq: Every day | ORAL | Status: DC
  Administered 2015-11-28: 100 mg via ORAL
  Filled 2015-11-27: qty 1

## 2015-11-27 MED ORDER — PROMETHAZINE HCL 25 MG/ML IJ SOLN: 6.2500 mg | INTRAMUSCULAR | Status: DC | PRN

## 2015-11-27 MED ORDER — POLYETHYLENE GLYCOL 3350 17 GM/SCOOP PO POWD: 0.5000 | Freq: Every day | ORAL | Status: DC

## 2015-11-27 MED ORDER — CEFAZOLIN SODIUM-DEXTROSE 2-4 GM/100ML-% IV SOLN
2.0000 g | Freq: Four times a day (QID) | INTRAVENOUS | Status: AC
  Administered 2015-11-27 – 2015-11-28 (×3): 2 g via INTRAVENOUS
  Filled 2015-11-27 (×3): qty 100

## 2015-11-27 MED ORDER — FLEET ENEMA 7-19 GM/118ML RE ENEM: 1.0000 | ENEMA | Freq: Once | RECTAL | Status: DC | PRN

## 2015-11-27 MED ORDER — PRAVASTATIN SODIUM 20 MG PO TABS
20.0000 mg | ORAL_TABLET | Freq: Every day | ORAL | Status: DC
  Administered 2015-11-27 – 2015-11-28 (×2): 20 mg via ORAL
  Filled 2015-11-27 (×2): qty 1

## 2015-11-27 MED ORDER — VITAMIN D 1000 UNITS PO TABS
1000.0000 [IU] | ORAL_TABLET | Freq: Every day | ORAL | Status: DC
Start: 2015-11-27 — End: 2015-11-28
  Administered 2015-11-28: 1000 [IU] via ORAL
  Filled 2015-11-27: qty 1

## 2015-11-27 MED ORDER — NEOMYCIN-POLYMYXIN B GU 40-200000 IR SOLN
Status: AC
  Filled 2015-11-27: qty 20

## 2015-11-27 MED ORDER — ACETAMINOPHEN 10 MG/ML IV SOLN
INTRAVENOUS | Status: DC | PRN
  Administered 2015-11-27: 1000 mg via INTRAVENOUS

## 2015-11-27 MED ORDER — EPHEDRINE SULFATE 50 MG/ML IJ SOLN
INTRAMUSCULAR | Status: DC | PRN
  Administered 2015-11-27 (×2): 10 mg via INTRAVENOUS
  Administered 2015-11-27 (×3): 5 mg via INTRAVENOUS
  Administered 2015-11-27 (×2): 10 mg via INTRAVENOUS

## 2015-11-27 MED ORDER — PHENYLEPHRINE HCL 10 MG/ML IJ SOLN
INTRAMUSCULAR | Status: AC
  Filled 2015-11-27: qty 1

## 2015-11-27 MED ORDER — LIDOCAINE HCL (CARDIAC) 20 MG/ML IV SOLN
INTRAVENOUS | Status: DC | PRN
  Administered 2015-11-27: 60 mg via INTRAVENOUS

## 2015-11-27 MED ORDER — FAMOTIDINE 20 MG PO TABS
ORAL_TABLET | ORAL | Status: AC
Start: 2015-11-27 — End: 2015-11-27
  Filled 2015-11-27: qty 1

## 2015-11-27 MED ORDER — PHENYLEPHRINE HCL 10 MG/ML IJ SOLN
INTRAMUSCULAR | Status: DC | PRN
  Administered 2015-11-27 (×2): 50 ug via INTRAVENOUS

## 2015-11-27 MED ORDER — COLCHICINE 0.6 MG PO TABS
0.6000 mg | ORAL_TABLET | Freq: Every day | ORAL | Status: DC
  Administered 2015-11-28: 0.6 mg via ORAL
  Filled 2015-11-27 (×2): qty 1

## 2015-11-27 MED ORDER — POLYETHYLENE GLYCOL 3350 17 GM/SCOOP PO POWD
0.5000 | Freq: Every day | ORAL | Status: DC
  Filled 2015-11-27: qty 255

## 2015-11-27 MED ORDER — ENOXAPARIN SODIUM 40 MG/0.4ML ~~LOC~~ SOLN
40.0000 mg | SUBCUTANEOUS | Status: DC
  Administered 2015-11-28: 40 mg via SUBCUTANEOUS
  Filled 2015-11-27: qty 0.4

## 2015-11-27 MED ORDER — MAGNESIUM HYDROXIDE 400 MG/5ML PO SUSP
30.0000 mL | Freq: Every day | ORAL | Status: DC | PRN
Start: 2015-11-27 — End: 2015-11-28

## 2015-11-27 MED ORDER — LATANOPROST 0.005 % OP SOLN
1.0000 [drp] | Freq: Every day | OPHTHALMIC | Status: DC
  Administered 2015-11-27: 1 [drp] via OPHTHALMIC
  Filled 2015-11-27: qty 2.5

## 2015-11-27 MED ORDER — LACTATED RINGERS IV SOLN
INTRAVENOUS | Status: DC
  Administered 2015-11-27 (×2): via INTRAVENOUS

## 2015-11-27 MED ORDER — CYANOCOBALAMIN 500 MCG PO TABS
250.0000 ug | ORAL_TABLET | ORAL | Status: DC
  Administered 2015-11-28: 250 ug via ORAL
  Filled 2015-11-27: qty 1

## 2015-11-27 MED ORDER — BUPIVACAINE HCL (PF) 0.5 % IJ SOLN
INTRAMUSCULAR | Status: DC | PRN
  Administered 2015-11-27: 30 mL via PERINEURAL

## 2015-11-27 MED ORDER — HYDROMORPHONE HCL 1 MG/ML IJ SOLN
0.5000 mg | INTRAMUSCULAR | Status: DC | PRN
  Administered 2015-11-27: 0.5 mg via INTRAVENOUS
  Filled 2015-11-27: qty 1

## 2015-11-27 MED ORDER — ACETAMINOPHEN 650 MG RE SUPP: 650.0000 mg | Freq: Four times a day (QID) | RECTAL | Status: DC | PRN

## 2015-11-27 MED ORDER — TRANEXAMIC ACID 1000 MG/10ML IV SOLN
INTRAVENOUS | Status: AC
  Filled 2015-11-27: qty 10

## 2015-11-27 MED ORDER — OXYCODONE HCL 5 MG PO TABS: 5.0000 mg | ORAL_TABLET | Freq: Once | ORAL | Status: DC | PRN

## 2015-11-27 MED ORDER — BISACODYL 10 MG RE SUPP
10.0000 mg | Freq: Every day | RECTAL | Status: DC | PRN
  Filled 2015-11-27: qty 1

## 2015-11-27 MED ORDER — ONDANSETRON HCL 4 MG PO TABS: 4.0000 mg | ORAL_TABLET | Freq: Four times a day (QID) | ORAL | Status: DC | PRN

## 2015-11-27 MED ORDER — NEOSTIGMINE METHYLSULFATE 10 MG/10ML IV SOLN
INTRAVENOUS | Status: DC | PRN
  Administered 2015-11-27: 1 mg via INTRAVENOUS

## 2015-11-27 MED ORDER — CEFAZOLIN SODIUM-DEXTROSE 2-4 GM/100ML-% IV SOLN
INTRAVENOUS | Status: AC
  Filled 2015-11-27: qty 100

## 2015-11-27 MED ORDER — PROPOFOL 10 MG/ML IV BOLUS
INTRAVENOUS | Status: DC | PRN
  Administered 2015-11-27: 100 mg via INTRAVENOUS

## 2015-11-27 MED ORDER — CEFAZOLIN SODIUM-DEXTROSE 2-4 GM/100ML-% IV SOLN
2.0000 g | Freq: Once | INTRAVENOUS | Status: AC
Start: 2015-11-27 — End: 2015-11-27
  Administered 2015-11-27: 2 g via INTRAVENOUS

## 2015-11-27 SURGICAL SUPPLY — 61 items
BAG DECANTER FOR FLEXI CONT (MISCELLANEOUS) ×2 IMPLANT
BLADE SAGITTAL WIDE XTHICK NO (BLADE) ×2 IMPLANT
BONE CEMENT PALACOSE (Orthopedic Implant) ×2 IMPLANT
BOWL CEMENT MIX W/ADAPTER (MISCELLANEOUS) ×2 IMPLANT
CANISTER SUCT 1200ML W/VALVE (MISCELLANEOUS) ×2 IMPLANT
CANISTER SUCT 3000ML PPV (MISCELLANEOUS) ×4 IMPLANT
CAPT SHLDR TOTAL 2 ×2 IMPLANT
CATH TRAY METER 16FR LF (MISCELLANEOUS) ×2 IMPLANT
CEMENT BONE PALACOSE (Orthopedic Implant) ×1 IMPLANT
CHLORAPREP W/TINT 26ML (MISCELLANEOUS) ×4 IMPLANT
COOLER POLAR GLACIER W/PUMP (MISCELLANEOUS) ×2 IMPLANT
DRAPE IMP U-DRAPE 54X76 (DRAPES) ×4 IMPLANT
DRAPE INCISE IOBAN 66X45 STRL (DRAPES) ×4 IMPLANT
DRAPE SHEET LG 3/4 BI-LAMINATE (DRAPES) ×4 IMPLANT
DRAPE TABLE BACK 80X90 (DRAPES) ×2 IMPLANT
DRSG OPSITE POSTOP 4X8 (GAUZE/BANDAGES/DRESSINGS) ×2 IMPLANT
ELECT CAUTERY BLADE 6.4 (BLADE) ×2 IMPLANT
GAUZE PACK 2X3YD (MISCELLANEOUS) ×2 IMPLANT
GLOVE BIO SURGEON STRL SZ7.5 (GLOVE) ×4 IMPLANT
GLOVE BIO SURGEON STRL SZ8 (GLOVE) ×4 IMPLANT
GLOVE BIOGEL PI IND STRL 8 (GLOVE) ×1 IMPLANT
GLOVE BIOGEL PI INDICATOR 8 (GLOVE) ×1
GLOVE INDICATOR 8.0 STRL GRN (GLOVE) ×2 IMPLANT
GOWN STRL REUS W/ TWL LRG LVL3 (GOWN DISPOSABLE) ×2 IMPLANT
GOWN STRL REUS W/ TWL XL LVL3 (GOWN DISPOSABLE) ×1 IMPLANT
GOWN STRL REUS W/TWL LRG LVL3 (GOWN DISPOSABLE) ×2
GOWN STRL REUS W/TWL XL LVL3 (GOWN DISPOSABLE) ×1
HANDPIECE INTERPULSE COAX TIP (DISPOSABLE) ×1
HOOD PEEL AWAY FLYTE STAYCOOL (MISCELLANEOUS) ×8 IMPLANT
KIT STABILIZATION SHOULDER (MISCELLANEOUS) ×2 IMPLANT
MASK FACE SPIDER DISP (MASK) ×2 IMPLANT
NDL MAYO CATGUT SZ1 (NEEDLE)
NDL MAYO CATGUT SZ5 (NEEDLE)
NDL SUT 5 .5 CRC TPR PNT MAYO (NEEDLE) IMPLANT
NEEDLE 18GX1X1/2 (RX/OR ONLY) (NEEDLE) ×2 IMPLANT
NEEDLE MAYO CATGUT SZ1 (NEEDLE) IMPLANT
NEEDLE MAYO CATGUT SZ4 (NEEDLE) IMPLANT
NEEDLE SPNL 20GX3.5 QUINCKE YW (NEEDLE) ×2 IMPLANT
NS IRRIG 1000ML POUR BTL (IV SOLUTION) ×2 IMPLANT
PACK ARTHROSCOPY SHOULDER (MISCELLANEOUS) ×2 IMPLANT
PAD WRAPON POLAR SHDR UNIV (MISCELLANEOUS) ×1 IMPLANT
PIN HUMERAL STMN 3.2MMX9IN (INSTRUMENTS) ×2 IMPLANT
SET HNDPC FAN SPRY TIP SCT (DISPOSABLE) ×1 IMPLANT
SLING ULTRA II M (MISCELLANEOUS) ×2 IMPLANT
SOL .9 NS 3000ML IRR  AL (IV SOLUTION) ×1
SOL .9 NS 3000ML IRR UROMATIC (IV SOLUTION) ×1 IMPLANT
SPONGE LAP 18X18 5 PK (GAUZE/BANDAGES/DRESSINGS) ×2 IMPLANT
STAPLER SKIN PROX 35W (STAPLE) ×2 IMPLANT
STRAP SAFETY BODY (MISCELLANEOUS) ×2 IMPLANT
SUT ETHIBOND 0 MO6 C/R (SUTURE) ×2 IMPLANT
SUT FIBERWIRE #2 38 BLUE 1/2 (SUTURE) ×2
SUT TICRON 2-0 30IN 311381 (SUTURE) ×10 IMPLANT
SUT VIC AB 0 CT1 36 (SUTURE) ×4 IMPLANT
SUT VIC AB 2-0 CT1 27 (SUTURE) ×3
SUT VIC AB 2-0 CT1 TAPERPNT 27 (SUTURE) ×3 IMPLANT
SUTURE FIBERWR #2 38 BLUE 1/2 (SUTURE) ×1 IMPLANT
SYR 30ML LL (SYRINGE) ×4 IMPLANT
SYR TB 1ML LUER SLIP (SYRINGE) ×2 IMPLANT
SYRINGE 10CC LL (SYRINGE) ×2 IMPLANT
SYRINGE IRR TOOMEY STRL 70CC (SYRINGE) ×2 IMPLANT
WRAPON POLAR PAD SHDR UNIV (MISCELLANEOUS) ×2

## 2015-11-27 NOTE — Anesthesia Procedure Notes (Signed)
Anesthesia Regional Block:  Interscalene brachial plexus block  Pre-Anesthetic Checklist: ,, timeout performed, Correct Patient, Correct Site, Correct Laterality, Correct Procedure, Correct Position, site marked, Risks and benefits discussed,  Surgical consent,  Pre-op evaluation,  At surgeon's request and post-op pain management   Prep: chloraprep       Needles:  Injection technique: Single-shot  Needle Type: Stimiplex     Needle Length: 5cm 5 cm Needle Gauge: 22 and 22 G    Additional Needles:  Procedures: ultrasound guided (picture in chart) Interscalene brachial plexus block Narrative:  Start time: 11/27/2015 7:35 AM End time: 11/27/2015 7:45 AM Injection made incrementally with aspirations every 5 mL.  Performed by: Personally  Anesthesiologist: Jazzmyn Filion  Additional Notes: Functioning IV was confirmed and monitors were applied.  A 48mm 22ga Stimuplex needle was used. Sterile prep and drape,hand hygiene and sterile gloves were used.  Negative aspiration and negative test dose prior to incremental administration of local anesthetic. The patient tolerated the procedure well.

## 2015-11-27 NOTE — Anesthesia Preprocedure Evaluation (Signed)
Anesthesia Evaluation  Patient identified by MRN, date of birth, ID band Patient awake    Reviewed: Allergy & Precautions, NPO status , Patient's Chart, lab work & pertinent test results  History of Anesthesia Complications Negative for: history of anesthetic complications  Airway Mallampati: II  TM Distance: >3 FB Neck ROM: Full    Dental  (+) Lower Dentures, Upper Dentures   Pulmonary neg pulmonary ROS, neg shortness of breath, neg sleep apnea,    breath sounds clear to auscultation- rhonchi (-) wheezing      Cardiovascular Exercise Tolerance: Good hypertension, Pt. on medications (-) CAD and (-) Past MI  Rhythm:Regular Rate:Normal - Systolic murmurs and - Diastolic murmurs    Neuro/Psych Anxiety Depression negative neurological ROS     GI/Hepatic Neg liver ROS, hiatal hernia, GERD  ,  Endo/Other  neg diabetesHypothyroidism   Renal/GU CRFRenal disease     Musculoskeletal  (+) Arthritis , Osteoarthritis,    Abdominal (+) - obese,   Peds  Hematology negative hematology ROS (+)   Anesthesia Other Findings Past Medical History: No date: Anxiety 10/18/2014: Carotid stenosis     Comment: R 1-39%, L 40-59%, rpt 1 yr (09/2014)  08/02/2014: CKD (chronic kidney disease) stage 3, GFR 30-5* No date: Degenerative disc disease     Comment: LS No date: Depression     Comment: nervous breakdonw in 1964-out of work for a               year No date: Diverticulosis     Comment: severe by colonoscopy No date: Family history of adverse reaction to anesthes*     Comment: n/v No date: GERD (gastroesophageal reflux disease) No date: Glaucoma 5/13: Heart murmur     Comment: mitral regurge - on echo  No date: History of hiatal hernia No date: History of shingles No date: HTN (hypertension) No date: Hyperlipidemia No date: Hypertension No date: Hypothyroidism 11/02/2006: IBS No date: Intestinal bacterial overgrowth     Comment:  In small colon 04/08/2012: Maxillary fracture ([place]) 2016: Osteoarthritis     Comment: Jefm Bryant) No date: Osteoporosis     Comment: dexa 2011 2016: Pseudogout     Comment: shoulders (Poggi)   Reproductive/Obstetrics negative OB ROS                             Anesthesia Physical Anesthesia Plan  ASA: III  Anesthesia Plan: General and Regional   Post-op Pain Management: GA combined w/ Regional for post-op pain   Induction: Intravenous  Airway Management Planned: Oral ETT  Additional Equipment:   Intra-op Plan:   Post-operative Plan: Extubation in OR  Informed Consent: I have reviewed the patients History and Physical, chart, labs and discussed the procedure including the risks, benefits and alternatives for the proposed anesthesia with the patient or authorized representative who has indicated his/her understanding and acceptance.   Dental advisory given  Plan Discussed with: CRNA and Anesthesiologist  Anesthesia Plan Comments:         Anesthesia Quick Evaluation

## 2015-11-27 NOTE — NC FL2 (Signed)
[place] LEVEL OF CARE SCREENING TOOL     IDENTIFICATION  Patient Name: Brooke Beasley Birthdate: 1930-07-30 Sex: female Admission Date (Current Location): 11/27/2015  [place] and [place] Number:  Engineering geologist and Address:  [facility], [address], [place], [place] [address]      Provider Number: [id number]  Attending Physician Name and Address:  Corky Mull, MD  Relative Name and Phone Number:       Current Level of Care: Hospital Recommended Level of Care: [place] Prior Approval Number:    Date Approved/Denied:   PASRR Number:  ([id number])  Discharge Plan: SNF    Current Diagnoses: Patient Active Problem List   Diagnosis Date Noted  . Status post total shoulder replacement 11/27/2015  . Preoperative evaluation to rule out surgical contraindication 11/09/2015  . Health maintenance examination 10/07/2015  . Pseudogout   . Left shoulder pain 11/04/2014  . Carotid stenosis 10/18/2014  . Advanced care planning/counseling discussion 09/26/2014  . Diverticulosis of colon without hemorrhage 09/26/2014  . CKD (chronic kidney disease) stage 3, GFR 30-59 ml/min 08/02/2014  . Restless legs 07/04/2014  . Dizziness 06/13/2014  . Lower abdominal pain 01/07/2014  . Primary osteoarthritis of both hands 11/21/2013  . Insomnia 10/23/2013  . Medicare annual wellness visit, subsequent 06/08/2013  . GERD (gastroesophageal reflux disease) 04/02/2013  . Anxiety associated with depression 12/26/2012  . Syncope due to orthostatic hypotension 04/08/2012  . Prediabetes 03/20/2012  . Heart murmur 09/13/2011  . Syncope 08/02/2011  . HYPERTENSION, BENIGN 02/24/2007  . Hypothyroidism 11/02/2006  . HLD (hyperlipidemia) 11/02/2006  . Glaucoma 11/02/2006  . HEMORRHOIDS, INTERNAL 11/02/2006  . CONSTIPATION, CHRONIC 11/02/2006  . Irritable bowel syndrome with constipation 11/02/2006  . Osteoporosis 11/02/2006  .  Osteoarthritis 10/20/2006    Orientation RESPIRATION BLADDER Height & Weight     Self, Time, Situation, Place  Normal Continent Weight: 155 lb (70.3 kg) Height:  5\' 2"  (157.5 cm)  BEHAVIORAL SYMPTOMS/MOOD NEUROLOGICAL BOWEL NUTRITION STATUS   (none )  (none ) Continent Diet (Diet: Regular )  AMBULATORY STATUS COMMUNICATION OF NEEDS Skin   Extensive Assist Verbally Surgical wounds (Incision: Left Shoulder )                       Personal Care Assistance Level of Assistance  Bathing, Feeding, Dressing Bathing Assistance: Limited assistance Feeding assistance: Independent Dressing Assistance: Limited assistance     Functional Limitations Info  Sight, Hearing, Speech Sight Info: Adequate Hearing Info: Impaired Speech Info: Adequate    SPECIAL CARE FACTORS FREQUENCY  PT (By licensed PT), OT (By licensed OT)     PT Frequency:  (5) OT Frequency:  (5)            Contractures      Additional Factors Info  Code Status, Allergies Code Status Info:  (Full Code. ) Allergies Info:  (Prednisone, Ace Inhibitors, Alendronate Sodium, Losartan, Other, Paroxetine Hcl, Risedronate Sodium, Silenor Doxepin Hcl, Simvastatin, Sulfonamide Derivatives, Tramadol, Influenza Vaccines)           Current Medications (11/27/2015):  This is the current hospital active medication list Current Facility-Administered Medications  Medication Dose Route Frequency Provider Last Rate Last Dose  . acetaminophen (TYLENOL) tablet 650 mg  650 mg Oral Q6H PRN Corky Mull, MD       Or  . acetaminophen (TYLENOL) suppository 650 mg  650 mg Rectal Q6H PRN Corky Mull, MD      .  acetaminophen (TYLENOL) tablet 1,000 mg  1,000 mg Oral Q6H Corky Mull, MD   1,000 mg at 11/27/15 1308  . acidophilus (RISAQUAD) capsule 1 capsule  1 capsule Oral Daily Corky Mull, MD      . amLODipine (NORVASC) tablet 5 mg  5 mg Oral Daily Corky Mull, MD      . bisacodyl (DULCOLAX) suppository 10 mg  10 mg Rectal Daily PRN  Corky Mull, MD      . ceFAZolin (ANCEF) 2-4 GM/100ML-% IVPB           . ceFAZolin (ANCEF) IVPB 2g/100 mL premix  2 g Intravenous Q6H Corky Mull, MD   2 g at 11/27/15 1308  . cholecalciferol (VITAMIN D) tablet 1,000 Units  1,000 Units Oral Daily Corky Mull, MD      . clonazePAM Bobbye [place]) tablet 0.5 mg  0.5 mg Oral QHS PRN Corky Mull, MD      . colchicine tablet 0.6 mg  0.6 mg Oral Daily Corky Mull, MD      . Derrill Memo ON 11/28/2015] cyanocobalamin tablet 250 mcg  250 mcg Oral Once per day on Mon Wed Fri Marshall Cork Poggi, MD      . dextrose 5 % and 0.9 % NaCl with KCl 20 mEq/L infusion   Intravenous Continuous Corky Mull, MD 75 mL/hr at 11/27/15 1309    . diphenhydrAMINE (BENADRYL) 12.5 MG/5ML elixir 12.5-25 mg  12.5-25 mg Oral Q4H PRN Corky Mull, MD      . docusate sodium (COLACE) capsule 100 mg  100 mg Oral BID Corky Mull, MD   100 mg at 11/27/15 1308  . [START ON 11/28/2015] enoxaparin (LOVENOX) injection 40 mg  40 mg Subcutaneous Q24H Corky Mull, MD      . famotidine (PEPCID) 20 MG tablet           . HYDROcodone-acetaminophen (NORCO/VICODIN) 5-325 MG per tablet 1-2 tablet  1-2 tablet Oral Q4H PRN Corky Mull, MD      . HYDROmorphone (DILAUDID) injection 0.5-1 mg  0.5-1 mg Intravenous Q2H PRN Corky Mull, MD      . latanoprost (XALATAN) 0.005 % ophthalmic solution 1 drop  1 drop Both Eyes QHS Corky Mull, MD      . levothyroxine (SYNTHROID, LEVOTHROID) tablet 75 mcg  75 mcg Oral Daily Corky Mull, MD      . magnesium hydroxide (MILK OF MAGNESIA) suspension 30 mL  30 mL Oral Daily PRN Corky Mull, MD      . metoCLOPramide (REGLAN) tablet 5-10 mg  5-10 mg Oral Q8H PRN Corky Mull, MD       Or  . metoCLOPramide (REGLAN) injection 5-10 mg  5-10 mg Intravenous Q8H PRN Corky Mull, MD      . Derrill Memo ON 11/28/2015] omega-3 acid ethyl esters (LOVAZA) capsule 2 g  2 g Oral Once per day on Mon Wed Fri Corky Mull, MD      . ondansetron [facility]) tablet 4 mg  4 mg Oral Q6H PRN Corky Mull, MD        Or  . ondansetron [facility]) injection 4 mg  4 mg Intravenous Q6H PRN Corky Mull, MD      . pantoprazole (PROTONIX) EC tablet 40 mg  40 mg Oral Daily Corky Mull, MD   40 mg at 11/27/15 1308  . polyethylene glycol (MIRALAX / GLYCOLAX) packet 17 g  17  g Oral Daily Corky Mull, MD      . pravastatin (PRAVACHOL) tablet 20 mg  20 mg Oral Daily Corky Mull, MD      . sertraline (ZOLOFT) tablet 100 mg  100 mg Oral Daily Corky Mull, MD      . sodium phosphate (FLEET) 7-19 GM/118ML enema 1 enema  1 enema Rectal Once PRN Corky Mull, MD      . vitamin E capsule 100 Units  100 Units Oral Daily Corky Mull, MD         Discharge Medications: Please see discharge summary for a list of discharge medications.  Relevant Imaging Results:  Relevant Lab Results:   Additional Information  SSN: [id number]  Niki Cosman, Veronia Beets, LCSW

## 2015-11-27 NOTE — H&P (Signed)
Paper H&P to be scanned into permanent record. H&P reviewed. No changes. 

## 2015-11-27 NOTE — Transfer of Care (Signed)
Immediate Anesthesia Transfer of Care Note  Patient: Brooke Beasley  Procedure(s) Performed: Procedure(s): TOTAL SHOULDER ARTHROPLASTY (Left)  Patient Location: PACU  Anesthesia Type:General  Level of Consciousness: awake and sedated  Airway & Oxygen Therapy: Patient Spontanous Breathing and Patient connected to face mask oxygen  Post-op Assessment: Report given to RN and Post -op Vital signs reviewed and stable  Post vital signs: Reviewed and stable  Last Vitals:  Vitals:   11/27/15 0612  BP: (!) 182/68  Pulse: 66  Resp: 16  Temp: 36.4 C    Last Pain:  Vitals:   11/27/15 0612  TempSrc: Oral         Complications: No apparent anesthesia complications

## 2015-11-27 NOTE — Anesthesia Procedure Notes (Signed)
Procedure Name: Intubation Date/Time: 11/27/2015 7:43 AM Performed by: Aline Brochure Pre-anesthesia Checklist: Patient identified, Emergency Drugs available, Suction available and Patient being monitored Patient Re-evaluated:Patient Re-evaluated prior to inductionOxygen Delivery Method: Circle system utilized Preoxygenation: Pre-oxygenation with 100% oxygen Intubation Type: IV induction Ventilation: Mask ventilation without difficulty Laryngoscope Size: Mac and 3 Grade View: Grade I Tube type: Oral Tube size: 7.0 mm Number of attempts: 1 Airway Equipment and Method: Stylet Placement Confirmation: ETT inserted through vocal cords under direct vision,  positive ETCO2 and breath sounds checked- equal and bilateral Secured at: 21 cm Tube secured with: Tape Dental Injury: Teeth and Oropharynx as per pre-operative assessment

## 2015-11-27 NOTE — Anesthesia Postprocedure Evaluation (Signed)
Anesthesia Post Note  Patient: Brooke Beasley  Procedure(s) Performed: Procedure(s) (LRB): TOTAL SHOULDER ARTHROPLASTY (Left)  Patient location during evaluation: PACU Anesthesia Type: General Level of consciousness: awake and alert and oriented Pain management: pain level controlled Vital Signs Assessment: post-procedure vital signs reviewed and stable Respiratory status: spontaneous breathing, nonlabored ventilation and respiratory function stable Cardiovascular status: blood pressure returned to baseline and stable Postop Assessment: no signs of nausea or vomiting Anesthetic complications: no    Last Vitals:  Vitals:   11/27/15 1137 11/27/15 1213  BP: (!) 119/59 124/63  Pulse: 71 72  Resp:    Temp: 36.4 C 36.5 C    Last Pain:  Vitals:   11/27/15 1213  TempSrc: Oral                 Tenlee Wollin

## 2015-11-27 NOTE — Progress Notes (Signed)
Applied polar care to shoulder

## 2015-11-27 NOTE — Op Note (Signed)
11/27/2015  10:16 AM  Patient:   Brooke Beasley  Pre-Op Diagnosis:   Degenerative joint disease, left shoulder.  Post-Op Diagnosis:   Same.  Procedure:   Left total shoulder arthroplasty.  Surgeon:   Pascal Lux, MD  Assistant:   Cameron Proud, PA-C; Delma Post, PA-S  Anesthesia:   General endotracheal intubation with an interscalene block.  Findings:   As above. The rotator cuff was in satisfactory condition.  Complications:   None  EBL:  550 cc  Fluids:   1600 cc crystalloid  UOP:   300 cc  TT:   None  Drains:   None  Closure:   Staples  Implants:   Biomet Comprehensive system with a pressfit #12 mini-humeral stem, a 46 x 24 mm humeral head, and a medium cemented glenoid component with a Regenerex post.  Brief Clinical Note:   The patient is a 82 year old female with a long history of gradually worsening left shoulder pain. Her symptoms have progressed despite medications, activity modification, steroid injections, etc. Her history and examination are consistent with advanced degenerative joint disease of the glenohumeral joint. An MRI scan showed tendinopathy changes, but no partial or full-thickness rotator cuff tears. The patient presents at this time for a left total shoulder arthroplasty.  Procedure:   The patient was brought into the operating room and lain in the supine position on the OR table. After adequate IV sedation was achieved, an interscalene block was placed by the anesthesiologist. The patient then underwent general endotracheal intubation and anesthesia before being repositioned in the beach chair position using the beach chair positioner. A Foley catheter was placed by the nurse. The left shoulder and upper extremity were prepped with ChloraPrep solution before being draped sterilely. Preoperative antibiotics were administered. A standard anterior approach to the shoulder was made through an approximately 4-5 inch incision. The incision was carried  down through the subcutaneous tissues to expose the deltopectoral fascia. The interval between the deltoid and pectoralis muscles was identified and this plane developed, retracting the cephalic vein laterally with the deltoid muscle. During mobilization of the cephalic vein, the vein tore, so it was tied off. The conjoined tendon was identified. The lateral margin was dissected and the Kolbel self-retraining retractor inserted. The "three sisters" were identified and cauterized. Bursal tissues were removed to improve visualization. The subscapularis tendon was released from its attachment to the lesser tuberosity 1 cm proximal to its insertion and several tagging sutures placed. The inferior capsule was released with care after identifying and protecting the axillary nerve. The proximal humeral cut was made at approximately 30 of retroversion using the extra-medullary guide.   Attention was directed to the glenoid. The labrum was debrided circumferentially before the center of the glenoid was marked with electrocautery. The small and medium sizers were positioned and it was elected to proceed with a small glenoid component. The guidewire was drilled into the glenoid neck using the appropriate guide. After verifying its position, the glenoid was lightly reamed with the butterfly reamer before the centralizing Regenerex post reamer was used. The small peripheral peg guide was positioned and each of the three pegs drilled sequentially, leaving the preceding drill bit in place so as to minimize shifting of the guide. The bony surfaces were prepared for cementing by irrigating them thoroughly with bacitracin saline solution using the jet lavage system, then packing the glenoid with a Neo-Synephrine soaked sponge. Meanwhile, cement was mixed on the back table. When it was ready, some cement  was injected into each of the three peg holes using a Toomey syringe and additional cement applied to the posterior aspect of the  glenoid component. The component was impacted into place and the excess cement was removed using a Surveyor, quantity. Pressure was maintained on the glenoid until the cement hardened.  Attention was directed to the humeral side. The humeral canal was reamed sequentially beginning with the end-cutting reamer then progressing from a 4 mm reamer up to a 12 mm reamer. This provided excellent circumferential chatter. The canal was broached beginning with a #9 broach and progressing to a #12 broach. This was left in place and a trial reduction performed using first the 46 x 18 mm, then the 46 x 21 mm, and finally the 46 x 24 mm trial humeral head. With the latter component, the arm demonstrated excellent range of motion as the hand could be brought across the chest to the opposite shoulder and brought to the top of the patient's head and to the patient's ear. The shoulder remained stable throughout this range of motion, and was stable with abduction and external rotation. The joint was dislocated and the trial components removed. The permanent #12 mini-stem was impacted into place with care taken to maintain the appropriate version. The permanent 46 x 24 mm humeral head with the standard taper set in the "D" position was put together on the back table and impacted into place. Again, the [facility] taper locking mechanism was verified using manual distraction. The shoulder was relocated and again placed through a range of motion with the findings as described above.  The wound was copiously irrigated with bacitracin saline solution using the jet lavage system before a total of 20 cc of Exparel diluted out to 60 cc with normal saline and 30 cc of 0.5% Sensorcaine with epinephrine was injected into the pericapsular and peri-incisional tissues to help with postoperative analgesia. The subscapularis tendon was reapproximated using #2 FiberWire interrupted sutures. The deltopectoral interval was closed using 2-0 Vicryl  interrupted sutures before the subcutaneous tissues also were closed using 2-0 Vicryl interrupted sutures. The skin was closed using staples. Prior to closing the skin, 1 g of transexemic acid in 10 cc of normal saline was injected intra-articularly to help with postoperative bleeding. A sterile occlusive dressing was applied to the wound before the arm was placed into a shoulder immobilizer with an abduction pillow. A polar pack system also was applied to the shoulder. The patient was then transferred back to a hospital bed before being awakened, extubated, and returned to the recovery room in satisfactory condition after tolerating the procedure well.

## 2015-11-27 NOTE — Progress Notes (Signed)
Physical Therapy Evaluation Patient Details Name: Brooke Beasley MRN: [id number] DOB: 12/22/1930 Today's Date: 11/27/2015   History of Present Illness  Pt is a 82 y/o female s/p L total shoulder arthroplasty.  Clinical Impression  Pt is a pleasant and cooperative 82 y/o female who is POD#0 L TSA. PLOF: Pt was independent with all household and community ambulation with no AD. Independent with ADLs and primary caregiver for husband who has dementia. Daughter will be taking two weeks off to assist pt at home. Pt reports she has no L shoulder pain at this moment. Pt requires min guard for transfers and min assist for ambulation. Pt reports some lightheaded upon standing, O2 sats at 93%-95%. Pt demonstrates an unsteady gait pattern with decreased stride length bilaterally. Verbal cueing to decrease gait speed for safety. O2 sats at end of session: 97%. Pt will benefit from skilled PT services to improve ROM, strength, functional mobility, and safety with DME use. At this time, pt is appropriate for HHPT services.     Follow Up Recommendations Home health PT    Equipment Recommendations  None recommended by PT (Pt has cane, RW, and rollator at home)    Recommendations for Other Services       Precautions / Restrictions Precautions Precautions: Shoulder Type of Shoulder Precautions: anterior TSA Shoulder Interventions: Shoulder sling/immobilizer;Shoulder abduction pillow Precaution Booklet Issued: No Restrictions Weight Bearing Restrictions: Yes LUE Weight Bearing: Non weight bearing      Mobility  Bed Mobility               General bed mobility comments: Pt in chair upon PT arrival. Bed mobility not attempted.  Transfers Overall transfer level: Modified independent Equipment used: 1 person hand held assist Transfers: Sit to/from Stand Sit to Stand: Min guard         General transfer comment: Min guard with sit to stand, dizziness noted. O2 sats at 93%. Pt requires verbal  cueing to wait until she can feel chair at back of knees and to reach for armrests prior to sitting.   Ambulation/Gait Ambulation/Gait assistance: Min assist Ambulation Distance (Feet): 5 Feet Assistive device: 1 person hand held assist Gait Pattern/deviations: Shuffle;Decreased stride length;Wide base of support   Gait velocity interpretation: Below normal speed for age/gender General Gait Details: Pt demonstrates unsteadiness with gait, no LOB noted. Pt demonstrates decreased stride length with verbal cues to decrease gait speed to improve safety and stability.  Stairs            Wheelchair Mobility    Modified Rankin (Stroke Patients Only)       Balance Overall balance assessment: Modified Independent Sitting-balance support: No upper extremity supported;Feet supported Sitting balance-Leahy Scale: Normal     Standing balance support: No upper extremity supported Standing balance-Leahy Scale: Good Standing balance comment: Pt demonstrates good standing balance with HHA.                             Pertinent Vitals/Pain Pain Assessment: No/denies pain    Home Living Family/patient expects to be discharged to:: Private residence Living Arrangements: Spouse/significant other Available Help at Discharge: Family Type of Home: House Home Access: Stairs to enter Entrance Stairs-Rails: None Entrance Stairs-Number of Steps: 1 Home Layout: Two level;Able to live on main level with bedroom/bathroom Home Equipment: Gilford Rile - 2 wheels;Shower seat;Grab bars - tub/shower;Grab bars - toilet;Cane - single point Additional Comments: Pt's daughter states she will be staying with  pt for the next two weeks to assist.    Prior Function Level of Independence: Independent         Comments: Pt reports she was independent with household and community ambulation with no AD. Independent for all ADLs. Pt is primary caregiver for husband who has dementia.     Hand  Dominance        Extremity/Trunk Assessment   Upper Extremity Assessment: Overall WFL for tasks assessed (R UE: grossly 4/5. R grip: 4/5. L grip: 4-/5)           Lower Extremity Assessment: Overall WFL for tasks assessed (B LE grossly 4/5)         Communication   Communication: HOH  Cognition Arousal/Alertness: Awake/alert Behavior During Therapy: WFL for tasks assessed/performed Overall Cognitive Status: Within Functional Limits for tasks assessed                      General Comments      Exercises        Assessment/Plan    PT Assessment Patient needs continued PT services  PT Diagnosis Generalized weakness;Difficulty walking   PT Problem List Decreased strength;Decreased range of motion;Decreased activity tolerance;Decreased balance;Decreased mobility;Decreased knowledge of use of DME;Decreased safety awareness  PT Treatment Interventions DME instruction;Gait training;Stair training;Functional mobility training;Therapeutic activities;Therapeutic exercise;Balance training;Patient/family education   PT Goals (Current goals can be found in the Care Plan section) Acute Rehab PT Goals Patient Stated Goal: To return home PT Goal Formulation: With patient Time For Goal Achievement: 12/11/15 Potential to Achieve Goals: Good    Frequency BID   Barriers to discharge        Co-evaluation               End of Session Equipment Utilized During Treatment: Gait belt Activity Tolerance: Patient tolerated treatment well Patient left: in chair;with call bell/phone within reach;with chair alarm set;with family/visitor present Nurse Communication: Mobility status         Time: [id number] PT Time Calculation (min) (ACUTE ONLY): 17 min   Charges:         PT G Codes:        Georgina Pillion 12/07/15, 5:11 PM  Georgina Pillion, [place]

## 2015-11-28 ENCOUNTER — Encounter: Payer: Self-pay | Admitting: Family Medicine

## 2015-11-28 LAB — BASIC METABOLIC PANEL
Anion gap: 6 (ref 5–15)
BUN: 9 mg/dL (ref 6–20)
CALCIUM: 8.4 mg/dL — AB (ref 8.9–10.3)
CO2: 27 mmol/L (ref 22–32)
CREATININE: 0.85 mg/dL (ref 0.44–1.00)
Chloride: 104 mmol/L (ref 101–111)
GLUCOSE: 113 mg/dL — AB (ref 65–99)
Potassium: 3.8 mmol/L (ref 3.5–5.1)
Sodium: 137 mmol/L (ref 135–145)

## 2015-11-28 LAB — CBC WITH DIFFERENTIAL/PLATELET
BASOS ABS: 0 10*3/uL (ref 0–0.1)
BASOS PCT: 0 %
EOS PCT: 3 %
Eosinophils Absolute: 0.3 10*3/uL (ref 0–0.7)
HEMATOCRIT: 35.8 % (ref 35.0–47.0)
Hemoglobin: 12.4 g/dL (ref 12.0–16.0)
Lymphocytes Relative: 12 %
Lymphs Abs: 1.1 10*3/uL (ref 1.0–3.6)
MCH: 32.2 pg (ref 26.0–34.0)
MCHC: 34.6 g/dL (ref 32.0–36.0)
MCV: 92.9 fL (ref 80.0–100.0)
MONO ABS: 1.2 10*3/uL — AB (ref 0.2–0.9)
MONOS PCT: 14 %
NEUTROS ABS: 6.3 10*3/uL (ref 1.4–6.5)
Neutrophils Relative %: 71 %
PLATELETS: 195 10*3/uL (ref 150–440)
RBC: 3.85 MIL/uL (ref 3.80–5.20)
RDW: 13.6 % (ref 11.5–14.5)
WBC: 8.9 10*3/uL (ref 3.6–11.0)

## 2015-11-28 MED ORDER — HYDROCODONE-ACETAMINOPHEN 5-325 MG PO TABS: 1.0000 | ORAL_TABLET | ORAL | 0 refills | Status: DC | PRN

## 2015-11-28 NOTE — Progress Notes (Signed)
Physical Therapy Treatment Patient Details Name: Brooke Beasley MRN: [id number] DOB: 1931-03-07 Today's Date: 11/28/2015    History of Present Illness Pt is an 82 y/o female who was admitted for a left total shoulder arthroplasty.    PT Comments    Pt reports she is feeling better this AM, seated in chair upon PT arrival. Pt requires min guard with transfers, supervision with ambulation, and min guard with stairs. Pt requires verbal and tactile cueing for safe ambulation with SPC, pt able to ambulate 200 ft around nurses station with a reciprocal gait pattern, able to demonstrate a more consistent gait pattern when pt is not concentrating on steps. Pt able to ascend/descend 1 step x 2 trials with [facility] with verbal cueing for [facility] placement. Reinforced importance of no active motion of L shoulder at this time, pt and daughter educated on TSA shoulder exercise packet. Pt has achieved her ambulation distance goal and step goal. Pt will benefit from further skilled PT in order to improve strength, ROM, functional mobility, and safety with DME use. At this time, pt is appropriate for HHPT in order to achieve long term goals.   Follow Up Recommendations  Home health PT     Equipment Recommendations  None recommended by PT (Pt has SPC at home)    Recommendations for Other Services       Precautions / Restrictions Precautions Precautions: Shoulder Type of Shoulder Precautions: anterior TSA Shoulder Interventions: Shoulder sling/immobilizer;Shoulder abduction pillow Precaution Booklet Issued: Yes (comment) Precaution Comments: TSA exercise packet Restrictions Weight Bearing Restrictions: Yes LUE Weight Bearing: Non weight bearing    Mobility  Bed Mobility               General bed mobility comments: Pt in chair upon PT arrival. Bed mobility not attempted.  Transfers Overall transfer level: Modified independent Equipment used: Straight cane Transfers: Sit to/from Stand Sit to  Stand: Min guard         General transfer comment: Min guard with sit to stand. Pt requires verbal cueing to wait until she can feel chair at back of knees and to reach for armrests prior to sitting.   Ambulation/Gait Ambulation/Gait assistance: Supervision Ambulation Distance (Feet): 200 Feet Assistive device: Straight cane Gait Pattern/deviations: Step-through pattern Gait velocity: 8 ft/sec Gait velocity interpretation: Below normal speed for age/gender General Gait Details: Pt demonstrates a more steady gait (vs. yesterday) with use of SPC. Verbal and tactile cueing to improve reciprocal gait wtih safety.    Stairs Stairs: Yes Stairs assistance: Min guard Stair Management: No rails;With cane [facility]) Number of Stairs: 1 (x 2 trials) General stair comments: Pt requires verbal cueing to bring SPC up first and push through cane to assist with stairs.    Wheelchair Mobility    Modified Rankin (Stroke Patients Only)       Balance Overall balance assessment: Modified Independent Sitting-balance support: No upper extremity supported;Feet supported Sitting balance-Leahy Scale: Normal     Standing balance support: Single extremity supported Standing balance-Leahy Scale: Good                      Cognition Arousal/Alertness: Awake/alert Behavior During Therapy: WFL for tasks assessed/performed Overall Cognitive Status: Within Functional Limits for tasks assessed                      Exercises Other Exercises Other Exercises: Seated ther ex: L active finger flexion/extension and L active wrist flexion/extension x 10 reps  with verbal cueing for proper technique, supervision. L active assisted elbow flexion/extension (min-mod assist) x 10 reps with tactile cueing for proper technique. Pt instructed on importance of no active movement of shoulder with any ther ex at this time.     General Comments        Pertinent Vitals/Pain Pain Assessment: 0-10 Pain  Score: 4  Pain Location: L shoulder Pain Intervention(s): Limited activity within patient's tolerance;Monitored during session;Ice applied    Home Living                      Prior Function            PT Goals (current goals can now be found in the care plan section) Acute Rehab PT Goals Patient Stated Goal: To return home, and be able to care for her husband. PT Goal Formulation: With patient Time For Goal Achievement: 12/11/15 Potential to Achieve Goals: Good Progress towards PT goals: Progressing toward goals    Frequency  BID    PT Plan Current plan remains appropriate    Co-evaluation             End of Session Equipment Utilized During Treatment: Gait belt Activity Tolerance: Patient tolerated treatment well Patient left: in chair;with call bell/phone within reach;with chair alarm set;with family/visitor present     Time: [id number] PT Time Calculation (min) (ACUTE ONLY): 43 min  Charges:                       G Codes:      Georgina Pillion 2015-12-04, 2:13 PM  Georgina Pillion, SPT [phone]

## 2015-11-28 NOTE — Progress Notes (Signed)
Clinical Social Worker (CSW) received SNF consult. PT is recommending home health. RN Case Manager is aware of above. Please reconsult if future social work needs arise. CSW signing off.   Hayes Rehfeldt, LCSW [phone] 

## 2015-11-28 NOTE — Care Management Note (Signed)
Case Management Note  Patient Details  Name: Brooke Beasley MRN: [id number] Date of Birth: 05/14/1930  Subjective/Objective:      Spoke with patient and daughter Brooke Beasley at the bedside. Patient is up to chair.  Daughter will be living with the patient for revcovery time.  Patient ambulates without assistance at home but has a walker and cane. Also has BSC.  Gave choice of [place] providers list, Patient and daughter agreed on [facility] for OT and PT.   Referral placed with Corliss Blacker at [facility].    No other CM needs identified.       Action/PlanDsicharge to home today with OT PT.  Expected Discharge Date:                  Expected Discharge Plan:  [place]  In-House Referral:     Discharge planning Services  CM Consult  Post Acute Care Choice:    Choice offered to:  Patient, Adult Children Brooke Beasley duaghter)  DME Arranged:  N/A DME Agency:  [facility] (now [facility])  [place] Arranged:  PT, OT [place] Agency:  [facility] (now [facility])  Status of Service:  Completed, signed off  If discussed at [employer] of Stay Meetings, dates discussed:    Additional Comments:  Brooke Heidelberg, RN 11/28/2015, 8:45 AM

## 2015-11-28 NOTE — Care Management Important Message (Signed)
Important Message  Patient Details  Name: Brooke Beasley MRN: [id number] Date of Birth: 1930/09/03   Medicare Important Message Given:  N/A - LOS <3 / Initial given by admissions    Alvie Heidelberg, RN 11/28/2015, 8:32 AM

## 2015-11-28 NOTE — Discharge Instructions (Signed)
Shoulder Joint Replacement, Care After Refer to this sheet in the next few weeks. These instructions provide you with information on caring for yourself after your procedure. Your health care provider may also give you more specific instructions. Your treatment has been planned according to current medical practices, but problems sometimes occur. Call your health care provider if you have any problems or questions after your procedure. WHAT TO EXPECT AFTER THE PROCEDURE After your procedure, your arm and shoulder will typically be stiff and bruised. This will improve over time. HOME CARE INSTRUCTIONS   You may resume your normal diet and activities as directed by your surgeon.  You should regain full use of your shoulder in 6 weeks.  Your arm will be in a sling. You will need to wear this for 4-6 weeks after surgery.  Wear the sling every night for at least the first month, or as instructed by your surgeon.  Do not use your arm to push yourself up in bed or from a chair. This requires too much muscle.  Follow the program of home exercises suggested. Do the exercises 4-5 times a day for a month or as directed.  Try not to overuse your shoulder. Overusing the shoulder is easy to do if this is the first time you have been pain free in a long time. Early overuse of the shoulder may result in later problems.  Do not lift anything heavier than a cup of coffee for the first 6 weeks after surgery.  Ask for help. Your health care provider may be able to suggest a clinic or agency for this if you do not have home support.  Do not participate in contact sports or do any heavy lifting (more than 10 lb [4.5 kg]) for at least 6 months, or as directed.  Apply ice to the injured area for the first 2 days after surgery:  Put ice in a plastic bag.  Place a towel between your skin and the bag.  Leave the ice on for 20 minutes, 2-3 times a day.  Change dressings if necessary or as directed.  Only  take over-the-counter or prescription medicines for pain, discomfort, or fever as directed by your health care provider.  Keep all follow-up appointments as directed. SEEK MEDICAL CARE IF:  You have redness, swelling, or increasing pain in the wound.  You see pus coming from the wound.  You have a fever.  You notice a bad smell coming from the wound or dressing.  The edges of the wound break open after sutures or staples have been removed.  You have increasing pain with movement of the shoulder. SEEK IMMEDIATE MEDICAL CARE IF:   You develop a rash.  You have chest pain or shortness of breath.  You have any reaction or side effects to medicine given. MAKE SURE YOU:  Understand these instructions.  Will watch your condition.  Will get help right away if you are not doing well or get worse.   This information is not intended to replace advice given to you by your health care provider. Make sure you discuss any questions you have with your health care provider.   Document Released: 10/30/2004 Document Revised: 04/17/2013 Document Reviewed: 11/09/2012 Elsevier Interactive Patient Education [employer].

## 2015-11-28 NOTE — Evaluation (Signed)
Occupational Therapy Evaluation Patient Details Name: Brooke Beasley MRN: [id number] DOB: 1931/01/24 Today's Date: 11/28/2015    History of Present Illness Pt is an 82 y/o female who was admitted for a left total shoulder arthroplasty.   Clinical Impression   Pt. Is an 82 y.o. Female who was admitted for Left Total shoulder replacement. Pt. Is left hand dominant. Pt. Presents with difficulty engaging her left hand in ADL tasks secondary to immobilization, pain, and decreased functional mobility during ADLs which hinder her ability to complete ADL and IADL functioning. Pt. Could benefit from skilled OT services for ADL training, A/E training, functional mobility for ADLs, and pt. education about DME in order to improve ADL functioning and return home.    Follow Up Recommendations  No OT follow up    Equipment Recommendations       Recommendations for Other Services PT consult     Precautions / Restrictions Precautions Precautions: Shoulder Shoulder Interventions: Shoulder sling/immobilizer;Shoulder abduction pillow Precaution Booklet Issued: No Restrictions Weight Bearing Restrictions: Yes LUE Weight Bearing: Non weight bearing                                                     ADL Overall ADL's : Needs assistance/impaired Eating/Feeding: Set up (Pt. is unable to use dominant right hand. for self-feeding set-up.)   Grooming: Set up (Pt. uses her right hand to complete ADL tasks, secondary to her left UE being immobilized.)               Lower Body Dressing: Moderate assistance;Minimal assistance               Functional mobility during ADLs: Min guard       Vision     Perception     Praxis      Pertinent Vitals/Pain Pain Assessment: 0-10 Pain Score: 4  Pain Location: Left shoulder     Hand Dominance Left   Extremity/Trunk Assessment             Communication Communication Communication: HOH   Cognition  Arousal/Alertness: Awake/alert Behavior During Therapy: WFL for tasks assessed/performed Overall Cognitive Status: Within Functional Limits for tasks assessed                     General Comments       Exercises       Shoulder Instructions      Home Living Family/patient expects to be discharged to:: Private residence Living Arrangements: Spouse/significant other Available Help at Discharge: Family Type of Home: House Home Access: Stairs to enter Technical brewer of Steps: 1 Entrance Stairs-Rails: None Home Layout: Two level;Able to live on main level with bedroom/bathroom     Bathroom Shower/Tub: Curtain;Tub/shower unit   Bathroom Toilet: Handicapped height Bathroom Accessibility: Yes   Home Equipment: Walker - 2 wheels;Shower seat;Grab bars - tub/shower;Grab bars - toilet;Cane - single point          Prior Functioning/Environment Level of Independence: Independent             OT Diagnosis: Generalized weakness   OT Problem List: Decreased strength;Decreased range of motion;Decreased activity tolerance;Decreased coordination;Pain;Impaired UE functional use;Decreased knowledge of use of DME or AE   OT Treatment/Interventions: Self-care/ADL training;DME and/or AE instruction;Patient/family education    OT Goals(Current goals can be found in the care  plan section) Acute Rehab OT Goals Patient Stated Goal: To return home, and be able to care for her husband. OT Goal Formulation: With patient Potential to Achieve Goals: Good  OT Frequency: Min 1X/week   Barriers to D/C:            Co-evaluation              End of Session    Activity Tolerance: Patient tolerated treatment well Patient left: in bed   Time: [id number] OT Time Calculation (min): 25 min Charges:  OT General Charges $OT Visit: 1 Procedure OT Evaluation $OT Eval Moderate Complexity: 1 Procedure G-Codes:    Harrel Carina, MS, OTR/L Harrel Carina 11/28/2015, 9:44  AM

## 2015-11-28 NOTE — Progress Notes (Signed)
Subjective: 1 Day Post-Op Procedure(s) (LRB): TOTAL SHOULDER ARTHROPLASTY (Left) Patient reports pain as mild and aching.   Patient is well, and has had no acute complaints or problems Plan is to go Home after hospital stay. Negative for chest pain and shortness of breath Fever: Recent fever of 99.8, denies any chills this AM. Gastrointestinal:Negative for nausea and vomiting  Objective: Vital signs in last 24 hours: Temp:  [97.4 F (36.3 C)-99.8 F (37.7 C)] 97.7 F (36.5 C) (08/04 0744) Pulse Rate:  [68-85] 68 (08/04 0744) Resp:  [11-18] 16 (08/04 0437) BP: (92-148)/(49-66) 92/49 (08/04 0744) SpO2:  [93 %-100 %] 98 % (08/04 0744)  Intake/Output from previous day:  Intake/Output Summary (Last 24 hours) at 11/28/15 0757 Last data filed at 11/28/15 0521  Gross per 24 hour  Intake             2555 ml  Output             4275 ml  Net            -1720 ml    Intake/Output this shift: No intake/output data recorded.  Labs:  Recent Labs  11/28/15 0602  HGB 12.4    Recent Labs  11/28/15 0602  WBC 8.9  RBC 3.85  HCT 35.8  PLT 195    Recent Labs  11/28/15 0602  NA 137  K 3.8  CL 104  CO2 27  BUN 9  CREATININE 0.85  GLUCOSE 113*  CALCIUM 8.4*   No results for input(s): LABPT, INR in the last 72 hours.   EXAM General - Patient is Alert, Appropriate and Oriented Extremity - ABD soft Sensation intact distally Intact pulses distally Incision: dressing C/D/I No cellulitis present Dressing/Incision - clean, dry, no drainage Motor Function - intact, moving foot and toes well on exam.   Decrease sensation to light touch over the dorsum of the left thumb.  Able to flex and extend fingers as well as wrist. Intact to light touch over the axillary nerve distribution to the left shoulder.  Past Medical History:  Diagnosis Date  . Anxiety   . Carotid stenosis 10/18/2014   R 1-39%, L 40-59%, rpt 1 yr (09/2014)   . CKD (chronic kidney disease) stage 3, GFR 30-59  ml/min 08/02/2014  . Degenerative disc disease    LS  . Depression    nervous breakdonw in 1964-out of work for a year  . Diverticulosis    severe by colonoscopy  . Family history of adverse reaction to anesthesia    n/v  . GERD (gastroesophageal reflux disease)   . Glaucoma   . Heart murmur 5/13   mitral regurge - on echo   . History of hiatal hernia   . History of shingles   . HTN (hypertension)   . Hyperlipidemia   . Hypertension   . Hypothyroidism   . IBS 11/02/2006  . Intestinal bacterial overgrowth    In small colon  . Maxillary fracture ([place]) 04/08/2012  . Osteoarthritis 2016   Jefm [facility])  . Osteoporosis    dexa 2011  . Pseudogout 2016   shoulders ([place])    Assessment/Plan: 1 Day Post-Op Procedure(s) (LRB): TOTAL SHOULDER ARTHROPLASTY (Left) Active Problems:   Status post total shoulder replacement  Estimated body mass index is 28.35 kg/m as calculated from the following:   Height as of this encounter: 5\' 2"  (1.575 m).   Weight as of this encounter: 70.3 kg (155 lb). Advance diet Up with therapy D/C IV fluids  Labs reviewed. Foley d/c, patient reports urinating without the foley. Up with therapy today, PT recommending HHPT.  Daughter is staying with her at home. Plan will be for discharge today pending PT and tolerating PO diet.  DVT Prophylaxis - Lovenox, Foot Pumps and TED hose Non-weight bearing to the left upper extremity  J. Cameron Proud, PA-C [facility] Orthopaedic Surgery 11/28/2015, 7:57 AM

## 2015-11-28 NOTE — Progress Notes (Signed)
Patient is being discharged to home with [facility]. IV removed; d/c and Rx instructions given and patient and daughter acknowledged understanding of those instructions. Belongings packed. Daughter here to take her home.

## 2015-11-28 NOTE — Discharge Summary (Signed)
Physician Discharge Summary  Patient ID: Brooke Beasley MRN: [id number] DOB/AGE: 05-09-1930 82 y.o.  Admit date: 11/27/2015 Discharge date: 11/28/2015  Admission Diagnoses:  PRIMARY OSTEOARTHRITIS,PSEUDOGOUT OF SHOULDER LEFT Degenerative joint disease, left shoulder  Discharge Diagnoses: Patient Active Problem List   Diagnosis Date Noted  . Status post total shoulder replacement 11/27/2015  . Preoperative evaluation to rule out surgical contraindication 11/09/2015  . Health maintenance examination 10/07/2015  . Pseudogout   . Left shoulder pain 11/04/2014  . Carotid stenosis 10/18/2014  . Advanced care planning/counseling discussion 09/26/2014  . Diverticulosis of colon without hemorrhage 09/26/2014  . CKD (chronic kidney disease) stage 3, GFR 30-59 ml/min 08/02/2014  . Restless legs 07/04/2014  . Dizziness 06/13/2014  . Lower abdominal pain 01/07/2014  . Primary osteoarthritis of both hands 11/21/2013  . Insomnia 10/23/2013  . Medicare annual wellness visit, subsequent 06/08/2013  . GERD (gastroesophageal reflux disease) 04/02/2013  . Anxiety associated with depression 12/26/2012  . Syncope due to orthostatic hypotension 04/08/2012  . Prediabetes 03/20/2012  . Heart murmur 09/13/2011  . Syncope 08/02/2011  . HYPERTENSION, BENIGN 02/24/2007  . Hypothyroidism 11/02/2006  . HLD (hyperlipidemia) 11/02/2006  . Glaucoma 11/02/2006  . HEMORRHOIDS, INTERNAL 11/02/2006  . CONSTIPATION, CHRONIC 11/02/2006  . Irritable bowel syndrome with constipation 11/02/2006  . Osteoporosis 11/02/2006  . Osteoarthritis 10/20/2006  Degenerative joint disease, left shoulder  Past Medical History:  Diagnosis Date  . Anxiety   . Carotid stenosis 10/18/2014   R 1-39%, L 40-59%, rpt 1 yr (09/2014)   . CKD (chronic kidney disease) stage 3, GFR 30-59 ml/min 08/02/2014  . Degenerative disc disease    LS  . Depression    nervous breakdonw in 1964-out of work for a year  . Diverticulosis    severe  by colonoscopy  . Family history of adverse reaction to anesthesia    n/v  . GERD (gastroesophageal reflux disease)   . Glaucoma   . Heart murmur 5/13   mitral regurge - on echo   . History of hiatal hernia   . History of shingles   . HTN (hypertension)   . Hyperlipidemia   . Hypertension   . Hypothyroidism   . IBS 11/02/2006  . Intestinal bacterial overgrowth    In small colon  . Maxillary fracture ([place]) 04/08/2012  . Osteoarthritis 2016   [facility])  . Osteoporosis    dexa 2011  . Pseudogout 2016   shoulders ([facility])     Transfusion: None   Consultants (if any): None  Discharged Condition: Improved  Hospital Course: Brooke Beasley is an 82 y.o. female who was admitted 11/27/2015 with a diagnosis of degenerative joint disease, left shoulder and went to the operating room on 11/27/2015 and underwent the above named procedures.    Surgeries: Procedure(s): TOTAL SHOULDER ARTHROPLASTY on 11/27/2015 Patient tolerated the surgery well. Taken to PACU where she was stabilized and then transferred to the orthopedic floor.  Started on Lovenox 40mg  q 24 hrs. Foot pumps applied bilaterally at 80 mm. Heels elevated on bed with rolled towels. No evidence of DVT. Negative Homan. Physical therapy started on day #1 for gait training and transfer. OT started day #1 for ADL and assisted devices.  Patient's IV and Foley were d/c on POD1.  Implants: Biomet Comprehensive system with a pressfit #12 mini-humeral stem, a 46 x 24 mm humeral head, and a medium cemented glenoid component with a Regenerex post.  She was given perioperative antibiotics:  Anti-infectives    Start  Dose/Rate Route Frequency Ordered Stop   11/27/15 1300  ceFAZolin (ANCEF) IVPB 2g/100 mL premix     2 g 200 mL/hr over 30 Minutes Intravenous Every 6 hours 11/27/15 1151 11/28/15 0048   11/27/15 0614  ceFAZolin (ANCEF) 2-4 GM/100ML-% IVPB    Comments:  Rexanne Mano: cabinet override      11/27/15 0614 11/27/15 1814    11/27/15 0300  ceFAZolin (ANCEF) IVPB 2g/100 mL premix     2 g 200 mL/hr over 30 Minutes Intravenous  Once 11/27/15 0247 11/27/15 0803    .  She was given sequential compression devices, early ambulation, and Lovenox for DVT prophylaxis.  She benefited maximally from the hospital stay and there were no complications.    Recent vital signs:  Vitals:   11/28/15 0437 11/28/15 0744  BP: (!) 144/63 (!) 92/49  Pulse: 85 68  Resp: 16   Temp: 99.8 F (37.7 C) 97.7 F (36.5 C)    Recent laboratory studies:  Lab Results  Component Value Date   HGB 12.4 11/28/2015   HGB 15.0 11/17/2015   HGB 13.8 09/25/2015   Lab Results  Component Value Date   WBC 8.9 11/28/2015   PLT 195 11/28/2015   Lab Results  Component Value Date   INR 0.90 11/17/2015   Lab Results  Component Value Date   NA 137 11/28/2015   K 3.8 11/28/2015   CL 104 11/28/2015   CO2 27 11/28/2015   BUN 9 11/28/2015   CREATININE 0.85 11/28/2015   GLUCOSE 113 (H) 11/28/2015    Discharge Medications:     Medication List    TAKE these medications   acetaminophen 500 MG tablet Commonly known as:  TYLENOL Take 500-1,000 mg by mouth at bedtime.   amLODipine 5 MG tablet Commonly known as:  NORVASC Take 1 tablet (5 mg total) by mouth daily.   bimatoprost 0.01 % Soln Commonly known as:  LUMIGAN Place 1 drop into both eyes at bedtime.   cholecalciferol 1000 units tablet Commonly known as:  VITAMIN D Take 1,000 Units by mouth daily.   clonazePAM 0.5 MG tablet Commonly known as:  KLONOPIN Take 1 tablet (0.5 mg total) by mouth at bedtime as needed for anxiety. What changed:  when to take this   colchicine 0.6 MG tablet Take 1 tablet (0.6 mg total) by mouth daily as needed. What changed:  See the new instructions.   fish oil-omega-3 fatty acids 1000 MG capsule Take 2 g by mouth 3 (three) times a week.   HYDROcodone-acetaminophen 5-325 MG tablet Commonly known as:  NORCO/VICODIN Take 1-2 tablets by mouth  every 4 (four) hours as needed (breakthrough pain).   levothyroxine 75 MCG tablet Commonly known as:  SYNTHROID, LEVOTHROID Take 1 tablet (75 mcg total) by mouth daily. What changed:  when to take this   MIRALAX powder Generic drug:  polyethylene glycol powder Take 0.5 Containers by mouth daily. OTC as directed.   OSTEO BI-FLEX TRIPLE STRENGTH PO Take 1 tablet by mouth daily. With Vit D   pravastatin 20 MG tablet Commonly known as:  PRAVACHOL Take 1 tablet (20 mg total) by mouth daily.   PROBIOTIC DAILY PO Take 1 capsule by mouth daily.   sertraline 100 MG tablet Commonly known as:  ZOLOFT Take 1 tablet (100 mg total) by mouth daily.   vitamin B-12 1000 MCG tablet Commonly known as:  CYANOCOBALAMIN Take 250 mcg by mouth 3 (three) times a week.   VITAMIN E PO Take 1  tablet by mouth daily.   VOLTAREN 1 % Gel Generic drug:  diclofenac sodium APPLY ONE APPLICATION TOPICALLY 3 TIMES DAILY What changed:  See the new instructions.       Diagnostic Studies: Dg Shoulder Left Port  Result Date: 11/27/2015 CLINICAL DATA:  Postop left total shoulder arthroplasty. EXAM: LEFT SHOULDER - 1 VIEW COMPARISON:  MRI 11/29/2014 FINDINGS: The prosthesis appears well positioned.  No complicating features. IMPRESSION: Well-positioned components of a total left shoulder arthroplasty. No complicating features. Electronically Signed   By: Marijo Sanes M.D.   On: 11/27/2015 11:03    Disposition: Plan will be for discharge home today pending sessions with physical therapy.    Follow-up Information    Judson Roch, PA-C Follow up in 10 day(s).   Specialty:  Physician Assistant Why:  Staple removal and skin check. Contact information: [place] [address] [phone]          Signed: Judson Roch PA-C 11/28/2015, 8:02 AM

## 2015-11-28 NOTE — Progress Notes (Signed)
Pts. Foley d/c'd at 79 with 50cc urine output.

## 2015-11-29 DIAGNOSIS — Z79891 Long term (current) use of opiate analgesic: Secondary | ICD-10-CM | POA: Diagnosis not present

## 2015-11-29 DIAGNOSIS — N183 Chronic kidney disease, stage 3 (moderate): Secondary | ICD-10-CM | POA: Diagnosis not present

## 2015-11-29 DIAGNOSIS — H409 Unspecified glaucoma: Secondary | ICD-10-CM | POA: Diagnosis not present

## 2015-11-29 DIAGNOSIS — M19042 Primary osteoarthritis, left hand: Secondary | ICD-10-CM | POA: Diagnosis not present

## 2015-11-29 DIAGNOSIS — Z96662 Presence of left artificial ankle joint: Secondary | ICD-10-CM | POA: Diagnosis not present

## 2015-11-29 DIAGNOSIS — F419 Anxiety disorder, unspecified: Secondary | ICD-10-CM | POA: Diagnosis not present

## 2015-11-29 DIAGNOSIS — Z7982 Long term (current) use of aspirin: Secondary | ICD-10-CM | POA: Diagnosis not present

## 2015-11-29 DIAGNOSIS — M810 Age-related osteoporosis without current pathological fracture: Secondary | ICD-10-CM | POA: Diagnosis not present

## 2015-11-29 DIAGNOSIS — Z471 Aftercare following joint replacement surgery: Secondary | ICD-10-CM | POA: Diagnosis not present

## 2015-11-29 DIAGNOSIS — M19041 Primary osteoarthritis, right hand: Secondary | ICD-10-CM | POA: Diagnosis not present

## 2015-11-29 DIAGNOSIS — I129 Hypertensive chronic kidney disease with stage 1 through stage 4 chronic kidney disease, or unspecified chronic kidney disease: Secondary | ICD-10-CM | POA: Diagnosis not present

## 2015-11-29 DIAGNOSIS — K581 Irritable bowel syndrome with constipation: Secondary | ICD-10-CM | POA: Diagnosis not present

## 2015-12-01 LAB — SURGICAL PATHOLOGY

## 2015-12-09 DIAGNOSIS — M19042 Primary osteoarthritis, left hand: Secondary | ICD-10-CM | POA: Diagnosis not present

## 2015-12-09 DIAGNOSIS — N183 Chronic kidney disease, stage 3 (moderate): Secondary | ICD-10-CM | POA: Diagnosis not present

## 2015-12-09 DIAGNOSIS — H409 Unspecified glaucoma: Secondary | ICD-10-CM | POA: Diagnosis not present

## 2015-12-09 DIAGNOSIS — K581 Irritable bowel syndrome with constipation: Secondary | ICD-10-CM | POA: Diagnosis not present

## 2015-12-09 DIAGNOSIS — M19041 Primary osteoarthritis, right hand: Secondary | ICD-10-CM | POA: Diagnosis not present

## 2015-12-09 DIAGNOSIS — I129 Hypertensive chronic kidney disease with stage 1 through stage 4 chronic kidney disease, or unspecified chronic kidney disease: Secondary | ICD-10-CM | POA: Diagnosis not present

## 2015-12-09 DIAGNOSIS — Z79891 Long term (current) use of opiate analgesic: Secondary | ICD-10-CM | POA: Diagnosis not present

## 2015-12-09 DIAGNOSIS — Z96662 Presence of left artificial ankle joint: Secondary | ICD-10-CM | POA: Diagnosis not present

## 2015-12-09 DIAGNOSIS — F419 Anxiety disorder, unspecified: Secondary | ICD-10-CM | POA: Diagnosis not present

## 2015-12-09 DIAGNOSIS — M810 Age-related osteoporosis without current pathological fracture: Secondary | ICD-10-CM | POA: Diagnosis not present

## 2015-12-09 DIAGNOSIS — Z7982 Long term (current) use of aspirin: Secondary | ICD-10-CM | POA: Diagnosis not present

## 2015-12-09 DIAGNOSIS — Z471 Aftercare following joint replacement surgery: Secondary | ICD-10-CM | POA: Diagnosis not present

## 2015-12-12 ENCOUNTER — Encounter: Payer: Self-pay | Admitting: Family Medicine

## 2015-12-12 NOTE — Telephone Encounter (Signed)
Please see Mychart message.

## 2015-12-17 DIAGNOSIS — I129 Hypertensive chronic kidney disease with stage 1 through stage 4 chronic kidney disease, or unspecified chronic kidney disease: Secondary | ICD-10-CM | POA: Diagnosis not present

## 2015-12-17 DIAGNOSIS — Z79891 Long term (current) use of opiate analgesic: Secondary | ICD-10-CM | POA: Diagnosis not present

## 2015-12-17 DIAGNOSIS — M810 Age-related osteoporosis without current pathological fracture: Secondary | ICD-10-CM | POA: Diagnosis not present

## 2015-12-17 DIAGNOSIS — Z471 Aftercare following joint replacement surgery: Secondary | ICD-10-CM | POA: Diagnosis not present

## 2015-12-17 DIAGNOSIS — F419 Anxiety disorder, unspecified: Secondary | ICD-10-CM | POA: Diagnosis not present

## 2015-12-17 DIAGNOSIS — Z7982 Long term (current) use of aspirin: Secondary | ICD-10-CM | POA: Diagnosis not present

## 2015-12-17 DIAGNOSIS — Z96662 Presence of left artificial ankle joint: Secondary | ICD-10-CM | POA: Diagnosis not present

## 2015-12-17 DIAGNOSIS — N183 Chronic kidney disease, stage 3 (moderate): Secondary | ICD-10-CM | POA: Diagnosis not present

## 2015-12-17 DIAGNOSIS — H409 Unspecified glaucoma: Secondary | ICD-10-CM | POA: Diagnosis not present

## 2015-12-17 DIAGNOSIS — K581 Irritable bowel syndrome with constipation: Secondary | ICD-10-CM | POA: Diagnosis not present

## 2015-12-17 DIAGNOSIS — M19042 Primary osteoarthritis, left hand: Secondary | ICD-10-CM | POA: Diagnosis not present

## 2015-12-17 DIAGNOSIS — M19041 Primary osteoarthritis, right hand: Secondary | ICD-10-CM | POA: Diagnosis not present

## 2015-12-22 DIAGNOSIS — M25532 Pain in left wrist: Secondary | ICD-10-CM | POA: Diagnosis not present

## 2015-12-22 DIAGNOSIS — M1812 Unilateral primary osteoarthritis of first carpometacarpal joint, left hand: Secondary | ICD-10-CM | POA: Diagnosis not present

## 2015-12-22 DIAGNOSIS — M79645 Pain in left finger(s): Secondary | ICD-10-CM | POA: Insufficient documentation

## 2015-12-23 DIAGNOSIS — M810 Age-related osteoporosis without current pathological fracture: Secondary | ICD-10-CM | POA: Diagnosis not present

## 2015-12-23 DIAGNOSIS — Z79891 Long term (current) use of opiate analgesic: Secondary | ICD-10-CM | POA: Diagnosis not present

## 2015-12-23 DIAGNOSIS — K581 Irritable bowel syndrome with constipation: Secondary | ICD-10-CM | POA: Diagnosis not present

## 2015-12-23 DIAGNOSIS — F419 Anxiety disorder, unspecified: Secondary | ICD-10-CM | POA: Diagnosis not present

## 2015-12-23 DIAGNOSIS — M19042 Primary osteoarthritis, left hand: Secondary | ICD-10-CM | POA: Diagnosis not present

## 2015-12-23 DIAGNOSIS — Z471 Aftercare following joint replacement surgery: Secondary | ICD-10-CM | POA: Diagnosis not present

## 2015-12-23 DIAGNOSIS — Z96662 Presence of left artificial ankle joint: Secondary | ICD-10-CM | POA: Diagnosis not present

## 2015-12-23 DIAGNOSIS — Z7982 Long term (current) use of aspirin: Secondary | ICD-10-CM | POA: Diagnosis not present

## 2015-12-23 DIAGNOSIS — H409 Unspecified glaucoma: Secondary | ICD-10-CM | POA: Diagnosis not present

## 2015-12-23 DIAGNOSIS — I129 Hypertensive chronic kidney disease with stage 1 through stage 4 chronic kidney disease, or unspecified chronic kidney disease: Secondary | ICD-10-CM | POA: Diagnosis not present

## 2015-12-23 DIAGNOSIS — M19041 Primary osteoarthritis, right hand: Secondary | ICD-10-CM | POA: Diagnosis not present

## 2015-12-23 DIAGNOSIS — N183 Chronic kidney disease, stage 3 (moderate): Secondary | ICD-10-CM | POA: Diagnosis not present

## 2015-12-26 DIAGNOSIS — Z96612 Presence of left artificial shoulder joint: Secondary | ICD-10-CM | POA: Diagnosis not present

## 2016-01-01 DIAGNOSIS — Z96612 Presence of left artificial shoulder joint: Secondary | ICD-10-CM | POA: Diagnosis not present

## 2016-01-06 DIAGNOSIS — Z96612 Presence of left artificial shoulder joint: Secondary | ICD-10-CM | POA: Diagnosis not present

## 2016-01-09 DIAGNOSIS — Z96612 Presence of left artificial shoulder joint: Secondary | ICD-10-CM | POA: Diagnosis not present

## 2016-01-13 ENCOUNTER — Encounter: Payer: Self-pay | Admitting: Family Medicine

## 2016-01-13 ENCOUNTER — Ambulatory Visit (INDEPENDENT_AMBULATORY_CARE_PROVIDER_SITE_OTHER): Payer: PPO | Admitting: Family Medicine

## 2016-01-13 VITALS — BP 140/80 | HR 72 | Temp 97.8°F | Wt 158.2 lb

## 2016-01-13 DIAGNOSIS — K648 Other hemorrhoids: Secondary | ICD-10-CM

## 2016-01-13 DIAGNOSIS — R194 Change in bowel habit: Secondary | ICD-10-CM | POA: Diagnosis not present

## 2016-01-13 DIAGNOSIS — Z96612 Presence of left artificial shoulder joint: Secondary | ICD-10-CM | POA: Diagnosis not present

## 2016-01-13 DIAGNOSIS — K644 Residual hemorrhoidal skin tags: Secondary | ICD-10-CM | POA: Insufficient documentation

## 2016-01-13 MED ORDER — PRAMOX-PE-GLYCERIN-PETROLATUM 1-0.25-14.4-15 % RE CREA: TOPICAL_CREAM | RECTAL | 0 refills | Status: DC

## 2016-01-13 NOTE — Patient Instructions (Addendum)
Continue probiotic. No more immodium.  Hold miralax and citrucel for now - as these can sometimes cause worsening GI discomfort. Increase water, fiber in diet, prune and apple juice. Let us know if not improving with this, or if fever or diarrhea returns - then I will ask you to come in for labs and stool test for infection.

## 2016-01-13 NOTE — Progress Notes (Signed)
Pre visit review using our clinic review tool, if applicable. No additional management support is needed unless otherwise documented below in the visit note. 

## 2016-01-13 NOTE — Assessment & Plan Note (Signed)
Noninflamed. Improving. Treat with prep H. Unable to [place] anusol HC 2/2 prednisone allergy.

## 2016-01-13 NOTE — Assessment & Plan Note (Signed)
Diarrhea then constipation in setting of recent shoulder replacement and narcotic use, as well as immodium and miralax/citrucel use. Anticipate bowel irregularities are more diet/med related. No signs of bacterial infection today. rec hold bowel regimen for now until feeling better. Rec increase fiber and water and apple or prune juice - diet changes first. Ok to use gas X PRN.  If no improvement or any worsening, low threhold to obtain C diff stool test and CBC, CMP. Pt agrees with plan.

## 2016-01-13 NOTE — Progress Notes (Signed)
BP 140/80   Pulse 72   Temp 97.8 F (36.6 C) (Oral)   Wt 158 lb 4 oz (71.8 kg)   BMI 28.94 kg/m    CC: loose stools Subjective:    Patient ID: Brooke Beasley, female    DOB: 1930-08-01, 82 y.o.   MRN: [id number]  HPI: Brooke Beasley is a 82 y.o. female presenting on 01/13/2016 for GI issue (loose stools since surgery and rectum is irritated)   3 wk h/o loose stools with increased stool frequency. This is associated with increased gassiness and bloating. Notes anal inflammation and swelling. Denies fevers/chills. No UTI sxs. No nausea/vomiting. No blood in stool. Last stool was this morning, small amt formed stool. Yesterday also formed stool.  No new foods. No recent travel.  She did take antibiotics perioperatively but not prolonged.   Has self treated with immodium - led to constipation with hard firm pellets. So she restarted miralax. She also takes citrucel and probiotic daily.   Total L shoulder arthroplasty 11/2015 ([facility]).  She stopped colchicine 4 wks ago.   Relevant past medical, surgical, family and social history reviewed and updated as indicated. Interim medical history since our last visit reviewed. Allergies and medications reviewed and updated. Current Outpatient Prescriptions on File Prior to Visit  Medication Sig  . acetaminophen (TYLENOL) 500 MG tablet Take 500-1,000 mg by mouth at bedtime.   Marland Kitchen amLODipine (NORVASC) 5 MG tablet Take 1 tablet (5 mg total) by mouth daily.  . bimatoprost (LUMIGAN) 0.01 % SOLN Place 1 drop into both eyes at bedtime.  . cholecalciferol (VITAMIN D) 1000 units tablet Take 1,000 Units by mouth daily.  . clonazePAM (KLONOPIN) 0.5 MG tablet Take 1 tablet (0.5 mg total) by mouth at bedtime as needed for anxiety. (Patient taking differently: Take 0.5 mg by mouth at bedtime. )  . fish oil-omega-3 fatty acids 1000 MG capsule Take 2 g by mouth 3 (three) times a week.  . levothyroxine (SYNTHROID, LEVOTHROID) 75 MCG tablet Take 1 tablet (75 mcg  total) by mouth daily. (Patient taking differently: Take 75 mcg by mouth daily before breakfast. )  . Misc Natural Products (OSTEO BI-FLEX TRIPLE STRENGTH PO) Take 1 tablet by mouth daily. With Vit D  . polyethylene glycol powder (MIRALAX) powder Take 0.5 Containers by mouth daily. OTC as directed.  . pravastatin (PRAVACHOL) 20 MG tablet Take 1 tablet (20 mg total) by mouth daily.  . Probiotic Product (PROBIOTIC DAILY PO) Take 1 capsule by mouth daily.   . sertraline (ZOLOFT) 100 MG tablet Take 1 tablet (100 mg total) by mouth daily.  Marland Kitchen VITAMIN E PO Take 1 tablet by mouth daily.  . VOLTAREN 1 % GEL APPLY ONE APPLICATION TOPICALLY 3 TIMES DAILY (Patient taking differently: APPLY ONE APPLICATION TOPICALLY daily as needed for shoulder pain)  . colchicine 0.6 MG tablet Take 1 tablet (0.6 mg total) by mouth daily as needed. (Patient not taking: Reported on 01/13/2016)  . HYDROcodone-acetaminophen (NORCO/VICODIN) 5-325 MG tablet Take 1-2 tablets by mouth every 4 (four) hours as needed (breakthrough pain). (Patient not taking: Reported on 01/13/2016)  . vitamin B-12 (CYANOCOBALAMIN) 1000 MCG tablet Take 250 mcg by mouth 3 (three) times a week.   No current facility-administered medications on file prior to visit.     Review of Systems Per HPI unless specifically indicated in ROS section     Objective:    BP 140/80   Pulse 72   Temp 97.8 F (36.6 C) (Oral)  Wt 158 lb 4 oz (71.8 kg)   BMI 28.94 kg/m   Wt Readings from Last 3 Encounters:  01/13/16 158 lb 4 oz (71.8 kg)  11/27/15 155 lb (70.3 kg)  11/07/15 155 lb 12.8 oz (70.7 kg)    Physical Exam  Constitutional: She appears well-developed and well-nourished. No distress.  HENT:  Mouth/Throat: Oropharynx is clear and moist. No oropharyngeal exudate.  Cardiovascular: Normal rate, regular rhythm and intact distal pulses.   Murmur (3/6 SEM USB) heard. Pulmonary/Chest: Effort normal and breath sounds normal. No respiratory distress. She has  no wheezes. She has no rales.  Abdominal: Soft. Normal appearance and bowel sounds are normal. She exhibits no distension and no mass. There is no hepatosplenomegaly. There is tenderness (mild) in the suprapubic area and left lower quadrant. There is no rigidity, no rebound, no guarding, no CVA tenderness and negative Murphy's sign.  Genitourinary: Rectal exam shows external hemorrhoid (noninflamed). Rectal exam shows no fissure.  Musculoskeletal: She exhibits no edema.  Skin: Skin is warm and dry. No rash noted.  Psychiatric: She has a normal mood and affect.  Nursing note and vitals reviewed.  Results for orders placed or performed during the hospital encounter of 11/27/15  CBC with Differential/Platelet  Result Value Ref Range   WBC 8.9 3.6 - 11.0 K/uL   RBC 3.85 3.80 - 5.20 MIL/uL   Hemoglobin 12.4 12.0 - 16.0 g/dL   HCT 35.8 35.0 - 47.0 %   MCV 92.9 80.0 - 100.0 fL   MCH 32.2 26.0 - 34.0 pg   MCHC 34.6 32.0 - 36.0 g/dL   RDW 13.6 11.5 - 14.5 %   Platelets 195 150 - 440 K/uL   Neutrophils Relative % 71 %   Neutro Abs 6.3 1.4 - 6.5 K/uL   Lymphocytes Relative 12 %   Lymphs Abs 1.1 1.0 - 3.6 K/uL   Monocytes Relative 14 %   Monocytes Absolute 1.2 (H) 0.2 - 0.9 K/uL   Eosinophils Relative 3 %   Eosinophils Absolute 0.3 0 - 0.7 K/uL   Basophils Relative 0 %   Basophils Absolute 0.0 0 - 0.1 K/uL  Basic metabolic panel  Result Value Ref Range   Sodium 137 135 - 145 mmol/L   Potassium 3.8 3.5 - 5.1 mmol/L   Chloride 104 101 - 111 mmol/L   CO2 27 22 - 32 mmol/L   Glucose, Bld 113 (H) 65 - 99 mg/dL   BUN 9 6 - 20 mg/dL   Creatinine, Ser 0.85 0.44 - 1.00 mg/dL   Calcium 8.4 (L) 8.9 - 10.3 mg/dL   GFR calc non Af Amer >60 >60 mL/min   GFR calc Af Amer >60 >60 mL/min   Anion gap 6 5 - 15  Surgical pathology  Result Value Ref Range   SURGICAL PATHOLOGY      Surgical Pathology CASE: [id number] PATIENT: [facility] Surgical Pathology Report     SPECIMEN SUBMITTED: A.  Humeral head, left  CLINICAL HISTORY: None provided  PRE-OPERATIVE DIAGNOSIS: Primary osteoarthritis, pseudo gout of shoulder left  POST-OPERATIVE DIAGNOSIS: Primary osteoarthritis left shoulder     DIAGNOSIS: A. LEFT HUMERAL HEAD; ARTHROPLASTY: - OSTEOARTHRITIS.   GROSS DESCRIPTION:  A. Labeled: left humeral head  Size of specimen:      Head -4.6 x 4.1 cm      Neck -4.2 x 3.7 cm      Additional tissue: aggregate, 4.5 x 4.1 x 0.8 cm  Articular surface: focally granular with eburnation  Cut  surface: tan to brown  Other findings: none noted  Block summary: 1 - representative section(s), post decalcification    Final Diagnosis performed by Delorse Lek, MD.  Electronically signed 12/01/2015 1:42:59PM    The electronic signature indicates that the named Attending Pathologist has evaluated the specimen  Technical component per formed at [facility], [address] [place], [place] [address] Lab: [phone] Dir: Darrick Penna. Evette Doffing, MD  Professional component performed at [facility], [facility], [place], [place], [place] [address] Lab: [phone] Dir: Dellia Nims. Rubinas, MD        Assessment & Plan:   Problem List Items Addressed This Visit    Bowel habit changes - Primary    Diarrhea then constipation in setting of recent shoulder replacement and narcotic use, as well as immodium and miralax/citrucel use. Anticipate bowel irregularities are more diet/med related. No signs of bacterial infection today. rec hold bowel regimen for now until feeling better. Rec increase fiber and water and apple or prune juice - diet changes first. Ok to use gas X PRN.  If no improvement or any worsening, low threhold to obtain C diff stool test and CBC, CMP. Pt agrees with plan.      Hemorrhoids, external    Noninflamed. Improving. Treat with prep H. Unable to [place] anusol HC 2/2 prednisone allergy.       Other Visit Diagnoses   None.      Follow up  plan: Return if symptoms worsen or fail to improve.  [name], MD

## 2016-01-15 DIAGNOSIS — Z96612 Presence of left artificial shoulder joint: Secondary | ICD-10-CM | POA: Diagnosis not present

## 2016-01-20 DIAGNOSIS — Z96612 Presence of left artificial shoulder joint: Secondary | ICD-10-CM | POA: Diagnosis not present

## 2016-01-22 DIAGNOSIS — Z96612 Presence of left artificial shoulder joint: Secondary | ICD-10-CM | POA: Diagnosis not present

## 2016-01-28 DIAGNOSIS — Z96612 Presence of left artificial shoulder joint: Secondary | ICD-10-CM | POA: Diagnosis not present

## 2016-01-30 DIAGNOSIS — Z96612 Presence of left artificial shoulder joint: Secondary | ICD-10-CM | POA: Diagnosis not present

## 2016-02-02 ENCOUNTER — Other Ambulatory Visit: Payer: Self-pay | Admitting: Surgery

## 2016-02-02 DIAGNOSIS — Z96612 Presence of left artificial shoulder joint: Secondary | ICD-10-CM

## 2016-02-02 DIAGNOSIS — M7582 Other shoulder lesions, left shoulder: Secondary | ICD-10-CM

## 2016-02-09 ENCOUNTER — Ambulatory Visit
Admission: RE | Admit: 2016-02-09 | Discharge: 2016-02-09 | Disposition: A | Discharge status: Home / Self Care | Payer: PPO | Source: Ambulatory Visit | Attending: Surgery | Admitting: Surgery

## 2016-02-09 DIAGNOSIS — M25512 Pain in left shoulder: Secondary | ICD-10-CM | POA: Diagnosis not present

## 2016-02-09 DIAGNOSIS — Z96612 Presence of left artificial shoulder joint: Secondary | ICD-10-CM

## 2016-02-09 DIAGNOSIS — M7582 Other shoulder lesions, left shoulder: Secondary | ICD-10-CM

## 2016-02-11 DIAGNOSIS — K5909 Other constipation: Secondary | ICD-10-CM | POA: Diagnosis not present

## 2016-02-12 DIAGNOSIS — H524 Presbyopia: Secondary | ICD-10-CM | POA: Diagnosis not present

## 2016-02-12 DIAGNOSIS — H401131 Primary open-angle glaucoma, bilateral, mild stage: Secondary | ICD-10-CM | POA: Diagnosis not present

## 2016-02-13 DIAGNOSIS — M75122 Complete rotator cuff tear or rupture of left shoulder, not specified as traumatic: Secondary | ICD-10-CM | POA: Insufficient documentation

## 2016-02-26 ENCOUNTER — Encounter: Payer: Self-pay | Admitting: Family Medicine

## 2016-03-03 ENCOUNTER — Encounter
Admission: RE | Admit: 2016-03-03 | Discharge: 2016-03-03 | Disposition: A | Discharge status: Home / Self Care | Payer: PPO | Source: Ambulatory Visit | Attending: Surgery | Admitting: Surgery

## 2016-03-03 DIAGNOSIS — Z96612 Presence of left artificial shoulder joint: Secondary | ICD-10-CM | POA: Insufficient documentation

## 2016-03-03 DIAGNOSIS — Z01812 Encounter for preprocedural laboratory examination: Secondary | ICD-10-CM | POA: Insufficient documentation

## 2016-03-03 DIAGNOSIS — M7592 Shoulder lesion, unspecified, left shoulder: Secondary | ICD-10-CM | POA: Diagnosis not present

## 2016-03-03 LAB — SURGICAL PCR SCREEN
MRSA, PCR: NEGATIVE
STAPHYLOCOCCUS AUREUS: NEGATIVE

## 2016-03-03 LAB — CBC
HCT: 40.3 % (ref 35.0–47.0)
HEMOGLOBIN: 13.9 g/dL (ref 12.0–16.0)
MCH: 31.4 pg (ref 26.0–34.0)
MCHC: 34.4 g/dL (ref 32.0–36.0)
MCV: 91.4 fL (ref 80.0–100.0)
PLATELETS: 206 10*3/uL (ref 150–440)
RBC: 4.41 MIL/uL (ref 3.80–5.20)
RDW: 14 % (ref 11.5–14.5)
WBC: 6.6 10*3/uL (ref 3.6–11.0)

## 2016-03-03 NOTE — Patient Instructions (Signed)
Your procedure is scheduled on: Tuesday 03/16/16 Report to Day Surgery. 2nd floor medical mall entrance To find out your arrival time please call [phone] between 1PM - 3PM on Monday 03/15/16.  Remember: Instructions that are not followed completely may result in serious medical risk, up to and including death, or upon the discretion of your surgeon and anesthesiologist your surgery may need to be rescheduled.    __X__ 1. Do not eat food or drink liquids after midnight. No gum chewing or hard candies.     __X__ 2. No Alcohol for 24 hours before or after surgery.   ____ 3. Bring all medications with you on the day of surgery if instructed.    __X__ 4. Notify your doctor if there is any change in your medical condition     (cold, fever, infections).     Do not wear jewelry, make-up, hairpins, clips or nail polish.  Do not wear lotions, powders, or perfumes.   Do not shave 48 hours prior to surgery. Men may shave face and neck.  Do not bring valuables to the hospital.    [facility] is not responsible for any belongings or valuables.               Contacts, dentures or bridgework may not be worn into surgery.  Leave your suitcase in the car. After surgery it may be brought to your room.  For patients admitted to the hospital, discharge time is determined by your                treatment team.   Patients discharged the day of surgery will not be allowed to drive home.   Please read over the following fact sheets that you were given:   Pain Booklet and MRSA Information   __x__ Take these medicines the morning of surgery with A SIP OF WATER:    1. amlodipine  2. levohyroxine  3. sertraline  4.  5.  6.  ____ Fleet Enema (as directed)   __x__ Use CHG Soap as directed  ____ Use inhalers on the day of surgery  ____ Stop metformin 2 days prior to surgery    ____ Take 1/2 of usual insulin dose the night before surgery and none on the morning of surgery.   ____ Stop  Coumadin/Plavix/aspirin on   ____ Stop Anti-inflammatories on    __x__ Stop supplements until after surgery.  (vitamin c, fish oil, osteo bi flex, tart cherry, turmeric, vitamin e)  ____ Bring C-Pap to the hospital.

## 2016-03-14 ENCOUNTER — Encounter: Payer: Self-pay | Admitting: Family Medicine

## 2016-03-14 ENCOUNTER — Other Ambulatory Visit: Payer: Self-pay | Admitting: Family Medicine

## 2016-03-15 NOTE — Pre-Procedure Instructions (Signed)
Requested H&P for surgery on 03/16/16.

## 2016-03-15 NOTE — Telephone Encounter (Signed)
Ok to refill? Last filled 11/07/15 #30 3RF 

## 2016-03-15 NOTE — Telephone Encounter (Signed)
plz phone in. 

## 2016-03-16 ENCOUNTER — Ambulatory Visit
Admission: RE | Admit: 2016-03-16 | Discharge: 2016-03-16 | Disposition: A | Discharge status: Home / Self Care | Payer: PPO | Source: Ambulatory Visit | Attending: Surgery | Admitting: Surgery

## 2016-03-16 ENCOUNTER — Ambulatory Visit: Payer: PPO | Admitting: Anesthesiology

## 2016-03-16 ENCOUNTER — Encounter
Admission: RE | Disposition: A | Discharge status: Home / Self Care | Payer: Self-pay | Source: Ambulatory Visit | Attending: Surgery

## 2016-03-16 ENCOUNTER — Encounter: Payer: Self-pay | Admitting: *Deleted

## 2016-03-16 DIAGNOSIS — Z887 Allergy status to serum and vaccine status: Secondary | ICD-10-CM | POA: Insufficient documentation

## 2016-03-16 DIAGNOSIS — F419 Anxiety disorder, unspecified: Secondary | ICD-10-CM | POA: Diagnosis not present

## 2016-03-16 DIAGNOSIS — K449 Diaphragmatic hernia without obstruction or gangrene: Secondary | ICD-10-CM | POA: Diagnosis not present

## 2016-03-16 DIAGNOSIS — S46812A Strain of other muscles, fascia and tendons at shoulder and upper arm level, left arm, initial encounter: Secondary | ICD-10-CM | POA: Insufficient documentation

## 2016-03-16 DIAGNOSIS — Y92531 Health care provider office as the place of occurrence of the external cause: Secondary | ICD-10-CM | POA: Diagnosis not present

## 2016-03-16 DIAGNOSIS — K219 Gastro-esophageal reflux disease without esophagitis: Secondary | ICD-10-CM | POA: Diagnosis not present

## 2016-03-16 DIAGNOSIS — X509XXA Other and unspecified overexertion or strenuous movements or postures, initial encounter: Secondary | ICD-10-CM | POA: Insufficient documentation

## 2016-03-16 DIAGNOSIS — M1990 Unspecified osteoarthritis, unspecified site: Secondary | ICD-10-CM | POA: Diagnosis not present

## 2016-03-16 DIAGNOSIS — I129 Hypertensive chronic kidney disease with stage 1 through stage 4 chronic kidney disease, or unspecified chronic kidney disease: Secondary | ICD-10-CM | POA: Insufficient documentation

## 2016-03-16 DIAGNOSIS — Z885 Allergy status to narcotic agent status: Secondary | ICD-10-CM | POA: Insufficient documentation

## 2016-03-16 DIAGNOSIS — Z888 Allergy status to other drugs, medicaments and biological substances status: Secondary | ICD-10-CM | POA: Insufficient documentation

## 2016-03-16 DIAGNOSIS — I6523 Occlusion and stenosis of bilateral carotid arteries: Secondary | ICD-10-CM | POA: Insufficient documentation

## 2016-03-16 DIAGNOSIS — Y93B9 Activity, other involving muscle strengthening exercises: Secondary | ICD-10-CM | POA: Insufficient documentation

## 2016-03-16 DIAGNOSIS — Z96612 Presence of left artificial shoulder joint: Secondary | ICD-10-CM | POA: Insufficient documentation

## 2016-03-16 DIAGNOSIS — G8918 Other acute postprocedural pain: Secondary | ICD-10-CM | POA: Diagnosis not present

## 2016-03-16 DIAGNOSIS — F329 Major depressive disorder, single episode, unspecified: Secondary | ICD-10-CM | POA: Insufficient documentation

## 2016-03-16 DIAGNOSIS — E039 Hypothyroidism, unspecified: Secondary | ICD-10-CM | POA: Diagnosis not present

## 2016-03-16 DIAGNOSIS — M75122 Complete rotator cuff tear or rupture of left shoulder, not specified as traumatic: Secondary | ICD-10-CM | POA: Diagnosis not present

## 2016-03-16 DIAGNOSIS — Z8719 Personal history of other diseases of the digestive system: Secondary | ICD-10-CM | POA: Insufficient documentation

## 2016-03-16 DIAGNOSIS — M19012 Primary osteoarthritis, left shoulder: Secondary | ICD-10-CM | POA: Diagnosis not present

## 2016-03-16 DIAGNOSIS — N189 Chronic kidney disease, unspecified: Secondary | ICD-10-CM | POA: Diagnosis not present

## 2016-03-16 DIAGNOSIS — Y998 Other external cause status: Secondary | ICD-10-CM | POA: Insufficient documentation

## 2016-03-16 DIAGNOSIS — Z882 Allergy status to sulfonamides status: Secondary | ICD-10-CM | POA: Diagnosis not present

## 2016-03-16 DIAGNOSIS — M5136 Other intervertebral disc degeneration, lumbar region: Secondary | ICD-10-CM | POA: Insufficient documentation

## 2016-03-16 DIAGNOSIS — M11212 Other chondrocalcinosis, left shoulder: Secondary | ICD-10-CM | POA: Diagnosis not present

## 2016-03-16 DIAGNOSIS — M25512 Pain in left shoulder: Secondary | ICD-10-CM | POA: Diagnosis not present

## 2016-03-16 DIAGNOSIS — M7582 Other shoulder lesions, left shoulder: Secondary | ICD-10-CM | POA: Diagnosis not present

## 2016-03-16 HISTORY — PX: TOTAL SHOULDER REVISION: SHX6130

## 2016-03-16 SURGERY — REVISION, TOTAL ARTHROPLASTY, SHOULDER
Anesthesia: General | Site: Shoulder | Laterality: Left | Wound class: Clean

## 2016-03-16 MED ORDER — CEFAZOLIN SODIUM-DEXTROSE 2-4 GM/100ML-% IV SOLN
INTRAVENOUS | Status: AC
  Administered 2016-03-16: 2 g via INTRAVENOUS
  Filled 2016-03-16: qty 100

## 2016-03-16 MED ORDER — TRANEXAMIC ACID 1000 MG/10ML IV SOLN
INTRAVENOUS | Status: DC | PRN
  Administered 2016-03-16: 1000 mg via TOPICAL

## 2016-03-16 MED ORDER — ACETAMINOPHEN 10 MG/ML IV SOLN
INTRAVENOUS | Status: DC | PRN
  Administered 2016-03-16: 1000 mg via INTRAVENOUS

## 2016-03-16 MED ORDER — MEPERIDINE HCL 25 MG/ML IJ SOLN: 6.2500 mg | INTRAMUSCULAR | Status: DC | PRN

## 2016-03-16 MED ORDER — FENTANYL CITRATE (PF) 100 MCG/2ML IJ SOLN: 25.0000 ug | INTRAMUSCULAR | Status: DC | PRN

## 2016-03-16 MED ORDER — FAMOTIDINE 20 MG PO TABS
ORAL_TABLET | ORAL | Status: AC
  Filled 2016-03-16: qty 1

## 2016-03-16 MED ORDER — ROCURONIUM BROMIDE 100 MG/10ML IV SOLN
INTRAVENOUS | Status: DC | PRN
  Administered 2016-03-16: 50 mg via INTRAVENOUS
  Administered 2016-03-16: 10 mg via INTRAVENOUS
  Administered 2016-03-16: 20 mg via INTRAVENOUS

## 2016-03-16 MED ORDER — BUPIVACAINE-EPINEPHRINE (PF) 0.5% -1:200000 IJ SOLN
INTRAMUSCULAR | Status: DC | PRN
  Administered 2016-03-16: 30 mL via PERINEURAL

## 2016-03-16 MED ORDER — NEOMYCIN-POLYMYXIN B GU 40-200000 IR SOLN
Status: DC | PRN
  Administered 2016-03-16: 14 mL

## 2016-03-16 MED ORDER — LIDOCAINE HCL (CARDIAC) 20 MG/ML IV SOLN
INTRAVENOUS | Status: DC | PRN
  Administered 2016-03-16: 40 mg via INTRAVENOUS
  Administered 2016-03-16: 60 mg via INTRAVENOUS

## 2016-03-16 MED ORDER — NEOMYCIN-POLYMYXIN B GU 40-200000 IR SOLN
Status: AC
  Filled 2016-03-16: qty 20

## 2016-03-16 MED ORDER — BUPIVACAINE-EPINEPHRINE (PF) 0.5% -1:200000 IJ SOLN
INTRAMUSCULAR | Status: AC
  Filled 2016-03-16: qty 60

## 2016-03-16 MED ORDER — PROMETHAZINE HCL 25 MG/ML IJ SOLN: 6.2500 mg | INTRAMUSCULAR | Status: DC | PRN

## 2016-03-16 MED ORDER — GLYCOPYRROLATE 0.2 MG/ML IJ SOLN
INTRAMUSCULAR | Status: DC | PRN
  Administered 2016-03-16: .2 mg via INTRAVENOUS

## 2016-03-16 MED ORDER — TRANEXAMIC ACID 1000 MG/10ML IV SOLN
INTRAVENOUS | Status: AC
  Filled 2016-03-16: qty 10

## 2016-03-16 MED ORDER — FAMOTIDINE 20 MG PO TABS
20.0000 mg | ORAL_TABLET | Freq: Once | ORAL | Status: AC
  Administered 2016-03-16: 20 mg via ORAL

## 2016-03-16 MED ORDER — PHENYLEPHRINE HCL 10 MG/ML IJ SOLN
INTRAMUSCULAR | Status: AC
  Filled 2016-03-16: qty 1

## 2016-03-16 MED ORDER — CEFAZOLIN SODIUM-DEXTROSE 2-3 GM-% IV SOLR
INTRAVENOUS | Status: DC | PRN
  Administered 2016-03-16: 2 g via INTRAVENOUS

## 2016-03-16 MED ORDER — FENTANYL CITRATE (PF) 100 MCG/2ML IJ SOLN
INTRAMUSCULAR | Status: DC | PRN
  Administered 2016-03-16: 75 ug via INTRAVENOUS
  Administered 2016-03-16: 125 ug via INTRAVENOUS

## 2016-03-16 MED ORDER — BUPIVACAINE HCL (PF) 0.5 % IJ SOLN
INTRAMUSCULAR | Status: DC | PRN
  Administered 2016-03-16: 30 mL via PERINEURAL

## 2016-03-16 MED ORDER — BUPIVACAINE LIPOSOME 1.3 % IJ SUSP
INTRAMUSCULAR | Status: AC
  Filled 2016-03-16: qty 20

## 2016-03-16 MED ORDER — LACTATED RINGERS IV SOLN
INTRAVENOUS | Status: DC
  Administered 2016-03-16 (×2): via INTRAVENOUS

## 2016-03-16 MED ORDER — ACETAMINOPHEN 10 MG/ML IV SOLN
INTRAVENOUS | Status: AC
  Filled 2016-03-16: qty 100

## 2016-03-16 MED ORDER — HYDROCODONE-ACETAMINOPHEN 5-325 MG PO TABS: 1.0000 | ORAL_TABLET | Freq: Four times a day (QID) | ORAL | 0 refills | Status: DC | PRN

## 2016-03-16 MED ORDER — DEXAMETHASONE SODIUM PHOSPHATE 4 MG/ML IJ SOLN
INTRAMUSCULAR | Status: DC | PRN
  Administered 2016-03-16: 4 mg via INTRAVENOUS

## 2016-03-16 MED ORDER — SODIUM CHLORIDE 0.9 % IV SOLN
INTRAVENOUS | Status: DC | PRN
  Administered 2016-03-16: 60 mL

## 2016-03-16 MED ORDER — CEFAZOLIN SODIUM-DEXTROSE 2-4 GM/100ML-% IV SOLN
2.0000 g | Freq: Once | INTRAVENOUS | Status: AC
  Administered 2016-03-16: 2 g via INTRAVENOUS

## 2016-03-16 MED ORDER — PHENYLEPHRINE HCL 10 MG/ML IJ SOLN
INTRAVENOUS | Status: DC | PRN
  Administered 2016-03-16: 40 ug/min via INTRAVENOUS

## 2016-03-16 MED ORDER — LIDOCAINE HCL (PF) 4 % IJ SOLN
INTRAMUSCULAR | Status: DC | PRN
  Administered 2016-03-16: 4 mL via RESPIRATORY_TRACT

## 2016-03-16 MED ORDER — PROPOFOL 10 MG/ML IV BOLUS
INTRAVENOUS | Status: DC | PRN
  Administered 2016-03-16: 100 mg via INTRAVENOUS

## 2016-03-16 MED ORDER — SODIUM CHLORIDE 0.9 % IJ SOLN
INTRAMUSCULAR | Status: AC
  Filled 2016-03-16: qty 50

## 2016-03-16 MED ORDER — SUGAMMADEX SODIUM 200 MG/2ML IV SOLN
INTRAVENOUS | Status: DC | PRN
  Administered 2016-03-16: 140 mg via INTRAVENOUS

## 2016-03-16 MED ORDER — OXYCODONE HCL 5 MG/5ML PO SOLN: 5.0000 mg | Freq: Once | ORAL | Status: DC | PRN

## 2016-03-16 MED ORDER — OXYCODONE HCL 5 MG PO TABS: 5.0000 mg | ORAL_TABLET | Freq: Once | ORAL | Status: DC | PRN

## 2016-03-16 SURGICAL SUPPLY — 63 items
BAG DECANTER FOR FLEXI CONT (MISCELLANEOUS) IMPLANT
BLADE SAGITTAL WIDE XTHICK NO (BLADE) IMPLANT
BOWL CEMENT MIX W/ADAPTER (MISCELLANEOUS) IMPLANT
CANISTER SUCT 1200ML W/VALVE (MISCELLANEOUS) ×3 IMPLANT
CANISTER SUCT 3000ML PPV (MISCELLANEOUS) ×6 IMPLANT
CATH TRAY METER 16FR LF (MISCELLANEOUS) ×3 IMPLANT
CHLORAPREP W/TINT 26ML (MISCELLANEOUS) ×3 IMPLANT
COOLER POLAR GLACIER W/PUMP (MISCELLANEOUS) IMPLANT
CRADLE LAMINECT ARM (MISCELLANEOUS) ×3 IMPLANT
DRAPE IMP U-DRAPE 54X76 (DRAPES) ×6 IMPLANT
DRAPE INCISE IOBAN 66X45 STRL (DRAPES) ×6 IMPLANT
DRAPE SHEET LG 3/4 BI-LAMINATE (DRAPES) ×6 IMPLANT
DRAPE TABLE BACK 80X90 (DRAPES) ×3 IMPLANT
DRSG OPSITE POSTOP 4X8 (GAUZE/BANDAGES/DRESSINGS) ×3 IMPLANT
ELECT CAUTERY BLADE 6.4 (BLADE) ×3 IMPLANT
GAUZE PACK 2X3YD (MISCELLANEOUS) ×3 IMPLANT
GLOVE BIO SURGEON STRL SZ7.5 (GLOVE) ×6 IMPLANT
GLOVE BIO SURGEON STRL SZ8 (GLOVE) ×6 IMPLANT
GLOVE BIOGEL PI IND STRL 8 (GLOVE) ×2 IMPLANT
GLOVE BIOGEL PI INDICATOR 8 (GLOVE) ×1
GLOVE INDICATOR 8.0 STRL GRN (GLOVE) ×3 IMPLANT
GOWN STRL REUS W/ TWL LRG LVL3 (GOWN DISPOSABLE) ×4 IMPLANT
GOWN STRL REUS W/ TWL XL LVL3 (GOWN DISPOSABLE) ×2 IMPLANT
GOWN STRL REUS W/TWL LRG LVL3 (GOWN DISPOSABLE) ×2
GOWN STRL REUS W/TWL XL LVL3 (GOWN DISPOSABLE) ×1
HANDPIECE INTERPULSE COAX TIP (DISPOSABLE) ×1
HEAD HUMERAL BIPOLAR 43X21X42 (Miscellaneous) ×2 IMPLANT
HEAD HUMERAL COMP STD (Orthopedic Implant) ×2 IMPLANT
HOOD PEEL AWAY FLYTE STAYCOOL (MISCELLANEOUS) ×9 IMPLANT
HUMERAL HEAD BIPOLAR 43X21X42 (Miscellaneous) ×3 IMPLANT
HUMERAL HEAD COMP STD (Orthopedic Implant) ×3 IMPLANT
KIT STABILIZATION SHOULDER (MISCELLANEOUS) ×3 IMPLANT
MASK FACE SPIDER DISP (MASK) ×3 IMPLANT
NDL MAYO CATGUT SZ1 (NEEDLE) ×3
NDL MAYO CATGUT SZ5 (NEEDLE)
NDL SUT 5 .5 CRC TPR PNT MAYO (NEEDLE) IMPLANT
NEEDLE 18GX1X1/2 (RX/OR ONLY) (NEEDLE) ×3 IMPLANT
NEEDLE MAYO CATGUT SZ1 (NEEDLE) ×2 IMPLANT
NEEDLE MAYO CATGUT SZ4 (NEEDLE) IMPLANT
NEEDLE REVERSE CUT 1/2 CRC (NEEDLE) ×3 IMPLANT
NEEDLE SPNL 20GX3.5 QUINCKE YW (NEEDLE) ×3 IMPLANT
NS IRRIG 1000ML POUR BTL (IV SOLUTION) ×3 IMPLANT
PACK ARTHROSCOPY SHOULDER (MISCELLANEOUS) ×3 IMPLANT
PAD WRAPON POLAR SHDR UNIV (MISCELLANEOUS) IMPLANT
SET HNDPC FAN SPRY TIP SCT (DISPOSABLE) ×2 IMPLANT
SLING ULTRA II M (MISCELLANEOUS) ×3 IMPLANT
SOL .9 NS 3000ML IRR  AL (IV SOLUTION) ×1
SOL .9 NS 3000ML IRR UROMATIC (IV SOLUTION) ×2 IMPLANT
SPONGE LAP 18X18 5 PK (GAUZE/BANDAGES/DRESSINGS) IMPLANT
STAPLER SKIN PROX 35W (STAPLE) ×3 IMPLANT
STRAP SAFETY BODY (MISCELLANEOUS) ×6 IMPLANT
SUT ETHIBOND 0 MO6 C/R (SUTURE) ×3 IMPLANT
SUT FIBERWIRE #2 38 BLUE 1/2 (SUTURE) ×12
SUT TICRON 2-0 30IN 311381 (SUTURE) ×15 IMPLANT
SUT VIC AB 0 CT1 36 (SUTURE) ×6 IMPLANT
SUT VIC AB 2-0 CT1 27 (SUTURE) ×2
SUT VIC AB 2-0 CT1 TAPERPNT 27 (SUTURE) ×4 IMPLANT
SUTURE FIBERWR #2 38 BLUE 1/2 (SUTURE) ×8 IMPLANT
SYR 30ML LL (SYRINGE) ×6 IMPLANT
SYR TB 1ML LUER SLIP (SYRINGE) IMPLANT
SYRINGE 10CC LL (SYRINGE) ×3 IMPLANT
SYRINGE IRR TOOMEY STRL 70CC (SYRINGE) IMPLANT
WRAPON POLAR PAD SHDR UNIV (MISCELLANEOUS)

## 2016-03-16 NOTE — Anesthesia Postprocedure Evaluation (Signed)
Anesthesia Post Note  Patient: Brooke Beasley  Procedure(s) Performed: Procedure(s) (LRB): TOTAL SHOULDER REVISION (Left)  Patient location during evaluation: PACU Anesthesia Type: General Level of consciousness: awake and alert and oriented Pain management: pain level controlled Vital Signs Assessment: post-procedure vital signs reviewed and stable Respiratory status: spontaneous breathing, nonlabored ventilation and respiratory function stable Cardiovascular status: blood pressure returned to baseline and stable Postop Assessment: no signs of nausea or vomiting Anesthetic complications: no    Last Vitals:  Vitals:   03/16/16 1054 03/16/16 1107  BP: (!) 131/58 (!) 155/71  Pulse: (!) 59 62  Resp: 13 16  Temp: 36.6 C 36.7 C    Last Pain:  Vitals:   03/16/16 1107  TempSrc: Temporal  PainSc: 0-No pain                 Corrinna Karapetyan

## 2016-03-16 NOTE — Progress Notes (Signed)
History and Physical printed from 03/03/2016 from Dr. Lang Snow office and placed in chart.

## 2016-03-16 NOTE — Op Note (Signed)
03/16/2016  10:10 AM  Patient:   Brooke Beasley  Pre-Op Diagnosis:   Subscapularis tendon tear status post total shoulder arthroplasty, left shoulder.  Post-Op Diagnosis:   Same.  Procedure:   Primary repair of subscapularis tendon with humeral head exchange status post total shoulder arthroplasty, left shoulder..  Surgeon:   Pascal Lux, MD  Assistant:   Cameron Proud, PA-C  Anesthesia:   General endotracheal intubation with an interscalene block.  Findings:   As above. The remainder of the rotator cuff was in satisfactory condition. The humeral and glenoid components were excellent position and without evidence of loosening.  Complications:   None  EBL:  150 cc  Fluids:   1300 cc crystalloid  UOP:   130 cc  TT:   None  Drains:   None  Closure:   Staples  Implants:   Biomet Comprehensive system with a 42 x 21 mm humeral head.  Brief Clinical Note:   The patient is an 82 year old female who underwent a left total shoulder arthroplasty in August, 2017. The patient was doing quite well and progressing with physical therapy. Approximately 8 weeks after surgery, while performing some exercises in therapy, she felt a pop in the anterior aspect of her left shoulder with the sudden onset of pain. Subsequent workup which included an ultrasound of the shoulder confirmed the presence of a tear of the subscapularis tendon. Plain x-rays also suggested that the humeral head implant may be slightly large in relation to her normal anatomy. The patient presents at this time for a primary repair of the subscapularis tendon with a possible humeral head exchange status post the prior left total shoulder arthroplasty.  Procedure:   The patient was brought into the operating room and lain in the supine position on the OR table. After adequate IV sedation was achieved, an interscalene block was placed by the anesthesiologist. The patient then underwent general endotracheal intubation and  anesthesia before being repositioned in the beach chair position using the beach chair positioner. A Foley catheter was placed by the nurse. The left shoulder and upper extremity were prepped with ChloraPrep solution before being draped sterilely. Preoperative antibiotics were administered. A standard anterior approach to the shoulder was made through the previous surgical incision. The incision was carried down through the subcutaneous tissues to expose the deltopectoral fascia. The interval between the deltoid and pectoralis muscles was identified and this plane developed using blunt dissection. The conjoined tendon was identified. The lateral margin was dissected and the Kolbel self-retraining retractor inserted. Bursal tissues were removed to improve visualization. The subscapularis tendon was identified and released from its attachment to the lesser tuberosity 1 cm proximal to its insertion and several tagging sutures placed. Careful dissection was carried out superiorly and inferiorly to permit the humeral head to be dislocated anteriorly. The humeral head was removed using the appropriate "pitchfork" extraction device. Several trial reductions were performed utilizing the 42 x 24 mm humeral head and a 42 x 21 mm head. The 42 x 21 mm head appeared to demonstrate excellent stability both anteriorly and posteriorly.  Attention was directed to the  subscapularis tendon. This tendon was released both superficially and deep circumferentially to permit it to be properly immobilized back to the lesser tuberosity. Several #2 FiberWire sutures were placed in the free end of the tendon in preparation for its repair. The permanent 42  x 21  mm humeral head with the standard taper set in the "C" position was put together  on the back table and impacted into place. The Morse taper locking mechanism was verified using manual distraction. The shoulder was relocated and placed through a range of motion with the findings as  described above.  The wound was copiously irrigated with bacitracin saline solution using the jet lavage system before a total of 20 cc of Exparel diluted out to 60 cc with normal saline and 30 cc of 0.5% Sensorcaine with epinephrine was injected into the pericapsular and peri-incisional tissues to help with postoperative analgesia. The subscapularis tendon was reapproximated using the three #2 FiberWire interrupted sutures already placed by passing them through the stump of the subscapularis tendon attached to the lesser tuberosity laterally. The deltopectoral interval was closed using 2-0 Vicryl interrupted sutures before the subcutaneous tissues also were closed using 2-0 Vicryl interrupted sutures. The skin was closed using staples. Prior to closing the skin, 1 g of transexemic acid in 10 cc of normal saline was injected intra-articularly to help with postoperative bleeding. A sterile occlusive dressing was applied to the wound before the arm was placed into a shoulder immobilizer with an abduction pillow. A polar pack system also was applied to the shoulder. The patient was then transferred back to a hospital bed before being awakened, extubated, and returned to the recovery room in satisfactory condition after tolerating the procedure well.the patient's Foley catheter was removed prior to awakening the patient.

## 2016-03-16 NOTE — Anesthesia Preprocedure Evaluation (Signed)
Anesthesia Evaluation  Patient identified by MRN, date of birth, ID band Patient awake    Reviewed: Allergy & Precautions, NPO status , Patient's Chart, lab work & pertinent test results  History of Anesthesia Complications Negative for: history of anesthetic complications  Airway Mallampati: II  TM Distance: >3 FB Neck ROM: Full    Dental  (+) Lower Dentures, Upper Dentures   Pulmonary neg pulmonary ROS, neg shortness of breath, neg sleep apnea,    breath sounds clear to auscultation- rhonchi (-) wheezing      Cardiovascular Exercise Tolerance: Good hypertension, Pt. on medications (-) CAD and (-) Past MI  Rhythm:Regular Rate:Normal - Systolic murmurs and - Diastolic murmurs    Neuro/Psych Anxiety Depression negative neurological ROS     GI/Hepatic Neg liver ROS, hiatal hernia, GERD  ,  Endo/Other  neg diabetesHypothyroidism   Renal/GU CRFRenal disease     Musculoskeletal  (+) Arthritis , Osteoarthritis,    Abdominal (+) - obese,   Peds  Hematology negative hematology ROS (+)   Anesthesia Other Findings Past Medical History: No date: Anxiety 10/18/2014: Carotid stenosis     Comment: R 1-39%, L 40-59%, rpt 1 yr (09/2014)  08/02/2014: CKD (chronic kidney disease) stage 3, GFR 30-5* No date: Degenerative disc disease     Comment: LS No date: Depression     Comment: nervous breakdonw in 1964-out of work for a               year No date: Diverticulosis     Comment: severe by colonoscopy No date: Family history of adverse reaction to anesthes*     Comment: n/v No date: GERD (gastroesophageal reflux disease) No date: Glaucoma 5/13: Heart murmur     Comment: mitral regurge - on echo  No date: History of hiatal hernia No date: History of shingles No date: HTN (hypertension) No date: Hyperlipidemia No date: Hypertension No date: Hypothyroidism 11/02/2006: IBS No date: Intestinal bacterial overgrowth     Comment:  In small colon 04/08/2012: Maxillary fracture ([place]) 2016: Osteoarthritis     Comment: Jefm Bryant) No date: Osteoporosis     Comment: dexa 2011 2016: Pseudogout     Comment: shoulders ([facility])   Reproductive/Obstetrics negative OB ROS                             Anesthesia Physical  Anesthesia Plan  ASA: III  Anesthesia Plan: General   Post-op Pain Management:  Regional for Post-op pain   Induction: Intravenous  Airway Management Planned: Oral ETT  Additional Equipment:   Intra-op Plan:   Post-operative Plan: Extubation in OR  Informed Consent: I have reviewed the patients History and Physical, chart, labs and discussed the procedure including the risks, benefits and alternatives for the proposed anesthesia with the patient or authorized representative who has indicated his/her understanding and acceptance.   Dental advisory given  Plan Discussed with: CRNA and Anesthesiologist  Anesthesia Plan Comments:         Anesthesia Quick Evaluation

## 2016-03-16 NOTE — Anesthesia Procedure Notes (Signed)
Procedure Name: Intubation Date/Time: 03/16/2016 7:50 AM Performed by: Rosaria Ferries, Rim Thatch Pre-anesthesia Checklist: Patient identified, Emergency Drugs available, Suction available and Patient being monitored Patient Re-evaluated:Patient Re-evaluated prior to inductionOxygen Delivery Method: Circle system utilized Preoxygenation: Pre-oxygenation with 100% oxygen Intubation Type: IV induction Laryngoscope Size: Mac and 3 Grade View: Grade I Tube size: 7.0 mm Number of attempts: 1 Airway Equipment and Method: LTA kit utilized Placement Confirmation: ETT inserted through vocal cords under direct vision,  breath sounds checked- equal and bilateral and positive ETCO2 Secured at: 21 cm Tube secured with: Tape Dental Injury: Teeth and Oropharynx as per pre-operative assessment

## 2016-03-16 NOTE — OR Nursing (Signed)
Spoke with Dr. Roland Rack regarding pt not being able to take NSAIDS due to kidney disease.  Dc Ibuprofen, no additional treatment needed.

## 2016-03-16 NOTE — H&P (Signed)
Paper H&P to be scanned into permanent record. H&P reviewed. No changes. 

## 2016-03-16 NOTE — Telephone Encounter (Signed)
Called in to [facility] - [place], [place] [address] [phone]  

## 2016-03-16 NOTE — Transfer of Care (Signed)
Immediate Anesthesia Transfer of Care Note  Patient: Brooke Beasley  Procedure(s) Performed: Procedure(s): TOTAL SHOULDER REVISION (Left)  Patient Location: PACU  Anesthesia Type:General  Level of Consciousness: awake, alert , oriented and patient cooperative  Airway & Oxygen Therapy: Patient Spontanous Breathing and Patient connected to nasal cannula oxygen  Post-op Assessment: Report given to RN and Post -op Vital signs reviewed and stable  Post vital signs: Reviewed and stable  Last Vitals:  Vitals:   03/16/16 0615  BP: (!) 168/77  Pulse: 73  Temp: 36.3 C    Last Pain:  Vitals:   03/16/16 0615  TempSrc: Oral         Complications: No apparent anesthesia complications

## 2016-03-16 NOTE — Discharge Instructions (Addendum)
AMBULATORY SURGERY  DISCHARGE INSTRUCTIONS   1) The drugs that you were given will stay in your system until tomorrow so for the next 24 hours you should not:  A) Drive an automobile B) Make any legal decisions C) Drink any alcoholic beverage   2) You may resume regular meals tomorrow.  Today it is better to start with liquids and gradually work up to solid foods.  You may eat anything you prefer, but it is better to start with liquids, then soup and crackers, and gradually work up to solid foods.   3) Please notify your doctor immediately if you have any unusual bleeding, trouble breathing, redness and pain at the surgery site, drainage, fever, or pain not relieved by medication.    4) Additional Instructions:  May shower with intact op-site dressing.  Apply ice frequently to shoulder or use Polar-care device. Take ibuprofen 600 mg TID with meals for 7-10 days, then as necessary. Take Norco as prescribed when needed.  Be careful if taking additional Tylenol - no more than 4000 mg/day in total. Keep shoulder immobilizer on at all times except may remove for bathing purposes. Follow-up in 10-14 days or as scheduled.       Please contact your physician with any problems or Same Day Surgery at [phone], Monday through Friday 6 am to 4 pm, or [place] at [facility] number at [phone].

## 2016-03-16 NOTE — Anesthesia Procedure Notes (Signed)
Anesthesia Regional Block:  Interscalene brachial plexus block  Pre-Anesthetic Checklist: ,, timeout performed, Correct Patient, Correct Site, Correct Laterality, Correct Procedure, Correct Position, site marked, Risks and benefits discussed,  Surgical consent,  Pre-op evaluation,  At surgeon's request and post-op pain management  Laterality: Left  Prep: chloraprep       Needles:  Injection technique: Single-shot  Needle Type: Stimiplex     Needle Length: 9cm 9 cm Needle Gauge: 22 and 22 G    Additional Needles:  Procedures: ultrasound guided (picture in chart) Interscalene brachial plexus block Narrative:  Start time: 03/16/2016 7:35 AM End time: 03/16/2016 7:45 AM Injection made incrementally with aspirations every 5 mL.  Performed by: Personally  Anesthesiologist: Jenney Brester  Additional Notes: Functioning IV was confirmed and monitors were applied.  A 59mm 22ga Stimuplex needle was used. Sterile prep and drape,hand hygiene and sterile gloves were used.  Negative aspiration and negative test dose prior to incremental administration of local anesthetic. The patient tolerated the procedure well.

## 2016-03-22 DIAGNOSIS — Z96612 Presence of left artificial shoulder joint: Secondary | ICD-10-CM | POA: Diagnosis not present

## 2016-03-22 DIAGNOSIS — M25512 Pain in left shoulder: Secondary | ICD-10-CM | POA: Diagnosis not present

## 2016-03-22 DIAGNOSIS — R29898 Other symptoms and signs involving the musculoskeletal system: Secondary | ICD-10-CM | POA: Diagnosis not present

## 2016-03-22 DIAGNOSIS — M25612 Stiffness of left shoulder, not elsewhere classified: Secondary | ICD-10-CM | POA: Diagnosis not present

## 2016-03-24 DIAGNOSIS — K625 Hemorrhage of anus and rectum: Secondary | ICD-10-CM | POA: Diagnosis not present

## 2016-03-24 DIAGNOSIS — K5909 Other constipation: Secondary | ICD-10-CM | POA: Diagnosis not present

## 2016-03-26 ENCOUNTER — Other Ambulatory Visit: Payer: Self-pay | Admitting: Gastroenterology

## 2016-03-26 DIAGNOSIS — K5909 Other constipation: Secondary | ICD-10-CM

## 2016-03-26 DIAGNOSIS — K625 Hemorrhage of anus and rectum: Secondary | ICD-10-CM

## 2016-04-02 DIAGNOSIS — M25612 Stiffness of left shoulder, not elsewhere classified: Secondary | ICD-10-CM | POA: Diagnosis not present

## 2016-04-02 DIAGNOSIS — Z96612 Presence of left artificial shoulder joint: Secondary | ICD-10-CM | POA: Diagnosis not present

## 2016-04-02 DIAGNOSIS — R29898 Other symptoms and signs involving the musculoskeletal system: Secondary | ICD-10-CM | POA: Diagnosis not present

## 2016-04-06 ENCOUNTER — Ambulatory Visit
Admission: RE | Admit: 2016-04-06 | Discharge: 2016-04-06 | Disposition: A | Discharge status: Home / Self Care | Payer: PPO | Source: Ambulatory Visit | Attending: Gastroenterology | Admitting: Gastroenterology

## 2016-04-06 DIAGNOSIS — K5909 Other constipation: Secondary | ICD-10-CM

## 2016-04-06 DIAGNOSIS — K625 Hemorrhage of anus and rectum: Secondary | ICD-10-CM

## 2016-04-09 ENCOUNTER — Ambulatory Visit (INDEPENDENT_AMBULATORY_CARE_PROVIDER_SITE_OTHER): Payer: PPO | Admitting: Family Medicine

## 2016-04-09 ENCOUNTER — Encounter: Payer: Self-pay | Admitting: Family Medicine

## 2016-04-09 VITALS — BP 130/80 | HR 88 | Temp 97.5°F | Wt 158.5 lb

## 2016-04-09 DIAGNOSIS — Z96612 Presence of left artificial shoulder joint: Secondary | ICD-10-CM | POA: Diagnosis not present

## 2016-04-09 DIAGNOSIS — K581 Irritable bowel syndrome with constipation: Secondary | ICD-10-CM | POA: Diagnosis not present

## 2016-04-09 DIAGNOSIS — R531 Weakness: Secondary | ICD-10-CM | POA: Diagnosis not present

## 2016-04-09 DIAGNOSIS — R1030 Lower abdominal pain, unspecified: Secondary | ICD-10-CM

## 2016-04-09 DIAGNOSIS — R5383 Other fatigue: Secondary | ICD-10-CM | POA: Insufficient documentation

## 2016-04-09 DIAGNOSIS — G4730 Sleep apnea, unspecified: Secondary | ICD-10-CM

## 2016-04-09 DIAGNOSIS — G479 Sleep disorder, unspecified: Secondary | ICD-10-CM | POA: Insufficient documentation

## 2016-04-09 DIAGNOSIS — R194 Change in bowel habit: Secondary | ICD-10-CM

## 2016-04-09 DIAGNOSIS — K5909 Other constipation: Secondary | ICD-10-CM

## 2016-04-09 DIAGNOSIS — K625 Hemorrhage of anus and rectum: Secondary | ICD-10-CM | POA: Insufficient documentation

## 2016-04-09 DIAGNOSIS — R5381 Other malaise: Secondary | ICD-10-CM | POA: Insufficient documentation

## 2016-04-09 LAB — CBC WITH DIFFERENTIAL/PLATELET
BASOS ABS: 0.1 10*3/uL (ref 0.0–0.1)
Basophils Relative: 0.5 % (ref 0.0–3.0)
EOS ABS: 0.4 10*3/uL (ref 0.0–0.7)
Eosinophils Relative: 4.2 % (ref 0.0–5.0)
HCT: 42.6 % (ref 36.0–46.0)
Hemoglobin: 14.5 g/dL (ref 12.0–15.0)
LYMPHS ABS: 2.2 10*3/uL (ref 0.7–4.0)
Lymphocytes Relative: 21 % (ref 12.0–46.0)
MCHC: 33.9 g/dL (ref 30.0–36.0)
MCV: 90.4 fl (ref 78.0–100.0)
MONOS PCT: 9.9 % (ref 3.0–12.0)
Monocytes Absolute: 1 10*3/uL (ref 0.1–1.0)
NEUTROS ABS: 6.8 10*3/uL (ref 1.4–7.7)
NEUTROS PCT: 64.4 % (ref 43.0–77.0)
PLATELETS: 382 10*3/uL (ref 150.0–400.0)
RBC: 4.71 Mil/uL (ref 3.87–5.11)
RDW: 13.4 % (ref 11.5–15.5)
WBC: 10.5 10*3/uL (ref 4.0–10.5)

## 2016-04-09 LAB — COMPREHENSIVE METABOLIC PANEL
ALK PHOS: 155 U/L — AB (ref 39–117)
ALT: 12 U/L (ref 0–35)
AST: 17 U/L (ref 0–37)
Albumin: 4.2 g/dL (ref 3.5–5.2)
BILIRUBIN TOTAL: 0.5 mg/dL (ref 0.2–1.2)
BUN: 16 mg/dL (ref 6–23)
CO2: 29 mEq/L (ref 19–32)
Calcium: 9.9 mg/dL (ref 8.4–10.5)
Chloride: 101 mEq/L (ref 96–112)
Creatinine, Ser: 1.03 mg/dL (ref 0.40–1.20)
GFR: 54.34 mL/min — ABNORMAL LOW (ref >60.00)
GLUCOSE: 111 mg/dL — AB (ref 70–99)
Potassium: 4.5 mEq/L (ref 3.5–5.1)
SODIUM: 140 meq/L (ref 135–145)
TOTAL PROTEIN: 7.3 g/dL (ref 6.0–8.3)

## 2016-04-09 LAB — TSH: TSH: 13.27 u[IU]/mL — ABNORMAL HIGH (ref 0.35–4.50)

## 2016-04-09 NOTE — Assessment & Plan Note (Signed)
New - hemorrhoid related vs other. Will check labs today including CEA. Was unable to complete recent virtual colonoscopy due to recent shoulder surgery.

## 2016-04-09 NOTE — Patient Instructions (Addendum)
We will see if insurance will approve overnight oxygen test (ONO).  Labs today and we will be in touch with results.

## 2016-04-09 NOTE — Assessment & Plan Note (Addendum)
Recent GETA x2 for shoulder surgeries.  Check labs today for further evaluation.   Encouraged continued good hydration and good nutrition.

## 2016-04-09 NOTE — Assessment & Plan Note (Signed)
Daughter endorses concerns for sleep apnea and sleep disturbances. Will order ONO to eval for hypoxias.

## 2016-04-09 NOTE — Addendum Note (Signed)
Addended by: [name] on: 04/09/2016 07:08 PM   Modules accepted: Orders

## 2016-04-09 NOTE — Progress Notes (Signed)
Pre visit review using our clinic review tool, if applicable. No additional management support is needed unless otherwise documented below in the visit note. 

## 2016-04-09 NOTE — Progress Notes (Addendum)
BP 130/80   Pulse 88   Temp 97.5 F (36.4 C) (Oral)   Wt 158 lb 8 oz (71.9 kg)   BMI 28.99 kg/m    CC: fatigue Subjective:    Patient ID: Brooke Beasley, female    DOB: 23-Mar-1931, 82 y.o.   MRN: [id number]  HPI: Brooke Beasley is a 82 y.o. female presenting on 04/09/2016 for Fatigue   Pt presents with daughter today to discuss ongoing fatigue. Feeling weak/tired over the past 2 months. Also endorses BRBPR at night over last 2 months. Daughter endorses some sleep talking and daughter has noticed some gasping and apneas at night time while asleep. Some increased anxiety recently as well. Last labwork was 02/2016, prior to shoulder surgery. Bowel movements continue alternating between loose and constipated. Denies fevers/chills, shortness of breath, coughing or chest pain. + abd pain, nausea, improves some with BM.  Recently saw GI Gustavo Lah) for chronic constipation - started on lactulose 1 tablespoon BID. Has been taking once daily - due to stool urgency. ?BRBPR hemorrhoid related. Steroid cream was helpful but didn't stop bleeding.   Unable to complete virtual colonoscopy this week due to UE mobility limitations after recent shoulder surgery.  Total L shoulder arthroplasty 11/2015 ([facility]) with revision 02/2016 after retear with PT.  Staying well hydrated, eating well.   Relevant past medical, surgical, family and social history reviewed and updated as indicated. Interim medical history since our last visit reviewed. Allergies and medications reviewed and updated. Current Outpatient Prescriptions on File Prior to Visit  Medication Sig  . acetaminophen (TYLENOL) 500 MG tablet Take 500-1,000 mg by mouth at bedtime. Depends on pain  . amLODipine (NORVASC) 5 MG tablet Take 1 tablet (5 mg total) by mouth daily.  . bimatoprost (LUMIGAN) 0.01 % SOLN Place 1 drop into both eyes at bedtime.  Marland Kitchen Bioflavonoid Products (VITAMIN C) CHEW Chew 1 tablet by mouth daily.  . Calcium Citrate  (CITRACAL PO) Take 1 scoop by mouth daily.  . cholecalciferol (VITAMIN D) 1000 units tablet Take 1,000 Units by mouth daily.  . clonazePAM (KLONOPIN) 0.5 MG tablet TAKE ONE TABLET BY MOUTH AT BEDTIME AS NEEDED FOR ANXIETY  . colchicine 0.6 MG tablet Take 1 tablet (0.6 mg total) by mouth daily as needed. (Patient taking differently: Take 1 tablet (0.6 mg total) by mouth daily as needed. For gout)  . Docusate Calcium (STOOL SOFTENER PO) Take 1 tablet by mouth at bedtime.  . fish oil-omega-3 fatty acids 1000 MG capsule Take 1 g by mouth daily.   Marland Kitchen HYDROcodone-acetaminophen (NORCO) 5-325 MG tablet Take 1-2 tablets by mouth every 6 (six) hours as needed for moderate pain. MAXIMUM TOTAL ACETAMINOPHEN DOSE IS 4000 MG PER DAY  . levothyroxine (SYNTHROID, LEVOTHROID) 75 MCG tablet Take 1 tablet (75 mcg total) by mouth daily. (Patient taking differently: Take 75 mcg by mouth daily before breakfast. )  . Misc Natural Products (OSTEO BI-FLEX TRIPLE STRENGTH PO) Take 1 tablet by mouth daily. With Vit D  . Misc Natural Products (TART CHERRY ADVANCED PO) Take 1,000 mg by mouth daily.  . Pramox-PE-Glycerin-Petrolatum (PREPARATION H) 1-0.25-14.4-15 % CREA Use as directed  . pravastatin (PRAVACHOL) 20 MG tablet Take 1 tablet (20 mg total) by mouth daily. (Patient taking differently: Take 20 mg by mouth every evening. )  . Probiotic Product (PROBIOTIC DAILY PO) Take 1 capsule by mouth daily.   . sertraline (ZOLOFT) 100 MG tablet Take 1 tablet (100 mg total) by mouth daily.  Marland Kitchen  TURMERIC PO Take 1 tablet by mouth daily.  Marland Kitchen VITAMIN E PO Take 1 tablet by mouth daily.  . VOLTAREN 1 % GEL APPLY ONE APPLICATION TOPICALLY 3 TIMES DAILY (Patient taking differently: APPLY ONE APPLICATION TOPICALLY daily as needed for shoulder pain)   No current facility-administered medications on file prior to visit.     Review of Systems Per HPI unless specifically indicated in ROS section     Objective:    BP 130/80   Pulse 88    Temp 97.5 F (36.4 C) (Oral)   Wt 158 lb 8 oz (71.9 kg)   BMI 28.99 kg/m   Wt Readings from Last 3 Encounters:  04/09/16 158 lb 8 oz (71.9 kg)  03/03/16 158 lb (71.7 kg)  01/13/16 158 lb 4 oz (71.8 kg)    Physical Exam  Constitutional: She appears well-developed and well-nourished. No distress.  HENT:  Mouth/Throat: Oropharynx is clear and moist. No oropharyngeal exudate.  Cardiovascular: Normal rate, regular rhythm, normal heart sounds and intact distal pulses.   No murmur heard. Pulmonary/Chest: Effort normal and breath sounds normal. No respiratory distress. She has no wheezes. She has no rales.  Abdominal: Soft. Bowel sounds are normal. She exhibits no distension and no mass. There is no tenderness. There is no rebound and no guarding.  Musculoskeletal: She exhibits no edema.  Skin: Skin is warm and dry. No rash noted.  Psychiatric: She has a normal mood and affect.  Nursing note and vitals reviewed.  Results for orders placed or performed in visit on 04/09/16  Comprehensive metabolic panel  Result Value Ref Range   Sodium 140 135 - 145 mEq/L   Potassium 4.5 3.5 - 5.1 mEq/L   Chloride 101 96 - 112 mEq/L   CO2 29 19 - 32 mEq/L   Glucose, Bld 111 (H) 70 - 99 mg/dL   BUN 16 6 - 23 mg/dL   Creatinine, Ser 1.03 0.40 - 1.20 mg/dL   Total Bilirubin 0.5 0.2 - 1.2 mg/dL   Alkaline Phosphatase 155 (H) 39 - 117 U/L   AST 17 0 - 37 U/L   ALT 12 0 - 35 U/L   Total Protein 7.3 6.0 - 8.3 g/dL   Albumin 4.2 3.5 - 5.2 g/dL   Calcium 9.9 8.4 - 10.5 mg/dL   GFR 54.34 (L) >60.00 mL/min  TSH  Result Value Ref Range   TSH 13.27 (H) 0.35 - 4.50 uIU/mL  CBC with Differential/Platelet  Result Value Ref Range   WBC 10.5 4.0 - 10.5 K/uL   RBC 4.71 3.87 - 5.11 Mil/uL   Hemoglobin 14.5 12.0 - 15.0 g/dL   HCT 42.6 36.0 - 46.0 %   MCV 90.4 78.0 - 100.0 fl   MCHC 33.9 30.0 - 36.0 g/dL   RDW 13.4 11.5 - 15.5 %   Platelets 382.0 150.0 - 400.0 K/uL   Neutrophils Relative % 64.4 43.0 - 77.0 %     Lymphocytes Relative 21.0 12.0 - 46.0 %   Monocytes Relative 9.9 3.0 - 12.0 %   Eosinophils Relative 4.2 0.0 - 5.0 %   Basophils Relative 0.5 0.0 - 3.0 %   Neutro Abs 6.8 1.4 - 7.7 K/uL   Lymphs Abs 2.2 0.7 - 4.0 K/uL   Monocytes Absolute 1.0 0.1 - 1.0 K/uL   Eosinophils Absolute 0.4 0.0 - 0.7 K/uL   Basophils Absolute 0.1 0.0 - 0.1 K/uL      Assessment & Plan:  Over 25 minutes were spent face-to-face with  the patient during this encounter and >50% of that time was spent on counseling and coordination of care  Problem List Items Addressed This Visit    Bowel habit changes   BRBPR (bright red blood per rectum) - Primary    New - hemorrhoid related vs other. Will check labs today including CEA. Was unable to complete recent virtual colonoscopy due to recent shoulder surgery.       Relevant Orders   CEA   CONSTIPATION, CHRONIC   Irritable bowel syndrome with constipation   Relevant Medications   lactulose (CHRONULAC) 10 GM/15ML solution   Lower abdominal pain   Relevant Orders   Comprehensive metabolic panel (Completed)   TSH (Completed)   CBC with Differential/Platelet (Completed)   Sleep disturbance    Daughter endorses concerns for sleep apnea and sleep disturbances. Will order ONO to eval for hypoxias.       Relevant Orders   DME Other see comment   Weakness    Recent GETA x2 for shoulder surgeries.  Check labs today for further evaluation.   Encouraged continued good hydration and good nutrition.       Relevant Orders   Comprehensive metabolic panel (Completed)   TSH (Completed)   CBC with Differential/Platelet (Completed)    Other Visit Diagnoses    Sleep apnea, unspecified type       Relevant Orders   DME Other see comment       Follow up plan: Return in about 6 months (around 10/08/2016) for annual exam, prior fasting for blood work.  [name], MD

## 2016-04-10 LAB — CEA: CEA: 4.3 ng/mL — AB

## 2016-04-13 ENCOUNTER — Other Ambulatory Visit: Payer: Self-pay | Admitting: Family Medicine

## 2016-04-13 DIAGNOSIS — E039 Hypothyroidism, unspecified: Secondary | ICD-10-CM

## 2016-04-13 DIAGNOSIS — R1030 Lower abdominal pain, unspecified: Secondary | ICD-10-CM

## 2016-04-13 DIAGNOSIS — R194 Change in bowel habit: Secondary | ICD-10-CM

## 2016-04-13 DIAGNOSIS — K625 Hemorrhage of anus and rectum: Secondary | ICD-10-CM

## 2016-04-13 DIAGNOSIS — R531 Weakness: Secondary | ICD-10-CM

## 2016-04-13 MED ORDER — LEVOTHYROXINE SODIUM 88 MCG PO TABS: 88.0000 ug | ORAL_TABLET | Freq: Every day | ORAL | 6 refills | Status: DC

## 2016-04-14 ENCOUNTER — Ambulatory Visit (INDEPENDENT_AMBULATORY_CARE_PROVIDER_SITE_OTHER)
Admission: RE | Admit: 2016-04-14 | Discharge: 2016-04-14 | Disposition: A | Discharge status: Home / Self Care | Payer: PPO | Source: Ambulatory Visit | Attending: Family Medicine | Admitting: Family Medicine

## 2016-04-14 ENCOUNTER — Other Ambulatory Visit: Payer: Self-pay | Admitting: *Deleted

## 2016-04-14 ENCOUNTER — Telehealth: Payer: Self-pay | Admitting: Family Medicine

## 2016-04-14 DIAGNOSIS — K5790 Diverticulosis of intestine, part unspecified, without perforation or abscess without bleeding: Secondary | ICD-10-CM | POA: Diagnosis not present

## 2016-04-14 DIAGNOSIS — R1030 Lower abdominal pain, unspecified: Secondary | ICD-10-CM | POA: Diagnosis not present

## 2016-04-14 DIAGNOSIS — R194 Change in bowel habit: Secondary | ICD-10-CM

## 2016-04-14 DIAGNOSIS — K625 Hemorrhage of anus and rectum: Secondary | ICD-10-CM

## 2016-04-14 DIAGNOSIS — R531 Weakness: Secondary | ICD-10-CM

## 2016-04-14 MED ORDER — LEVOTHYROXINE SODIUM 88 MCG PO TABS: 88.0000 ug | ORAL_TABLET | Freq: Every day | ORAL | 6 refills | Status: DC

## 2016-04-14 MED ORDER — IOPAMIDOL (ISOVUE-300) INJECTION 61%
100.0000 mL | Freq: Once | INTRAVENOUS | Status: AC | PRN
  Administered 2016-04-14: 100 mL via INTRAVENOUS

## 2016-04-14 NOTE — Telephone Encounter (Signed)
agree

## 2016-04-14 NOTE — Telephone Encounter (Signed)
Pt's daughter came by and was wanting to know if the PA for the Synthroid was done so she can go and pick up the medication.  She said you may call her back at the home number.

## 2016-04-14 NOTE — Telephone Encounter (Signed)
Message from pharmacy stating they had switched manufacturers for levothyroxine from [employer] to [place] and wanted to confirm that change was okay for patient. Confirmed that change was okay and scheduled lab appt for 6 weeks.

## 2016-04-14 NOTE — Telephone Encounter (Signed)
PA not required. Pharmacy had changed manufacturers and needed ok to change. Okayed changed and lab appt scheduled.

## 2016-04-16 DIAGNOSIS — R29898 Other symptoms and signs involving the musculoskeletal system: Secondary | ICD-10-CM | POA: Diagnosis not present

## 2016-04-16 DIAGNOSIS — M25511 Pain in right shoulder: Secondary | ICD-10-CM | POA: Diagnosis not present

## 2016-04-16 DIAGNOSIS — M7582 Other shoulder lesions, left shoulder: Secondary | ICD-10-CM | POA: Diagnosis not present

## 2016-04-16 DIAGNOSIS — Z96612 Presence of left artificial shoulder joint: Secondary | ICD-10-CM | POA: Diagnosis not present

## 2016-04-23 DIAGNOSIS — Z96612 Presence of left artificial shoulder joint: Secondary | ICD-10-CM | POA: Diagnosis not present

## 2016-04-30 DIAGNOSIS — Z96612 Presence of left artificial shoulder joint: Secondary | ICD-10-CM | POA: Diagnosis not present

## 2016-05-03 DIAGNOSIS — M25511 Pain in right shoulder: Secondary | ICD-10-CM | POA: Diagnosis not present

## 2016-05-03 DIAGNOSIS — M25612 Stiffness of left shoulder, not elsewhere classified: Secondary | ICD-10-CM | POA: Diagnosis not present

## 2016-05-03 DIAGNOSIS — Z96612 Presence of left artificial shoulder joint: Secondary | ICD-10-CM | POA: Diagnosis not present

## 2016-05-03 DIAGNOSIS — R29898 Other symptoms and signs involving the musculoskeletal system: Secondary | ICD-10-CM | POA: Diagnosis not present

## 2016-05-07 DIAGNOSIS — Z96612 Presence of left artificial shoulder joint: Secondary | ICD-10-CM | POA: Diagnosis not present

## 2016-05-10 DIAGNOSIS — M25511 Pain in right shoulder: Secondary | ICD-10-CM | POA: Diagnosis not present

## 2016-05-10 DIAGNOSIS — Z96612 Presence of left artificial shoulder joint: Secondary | ICD-10-CM | POA: Diagnosis not present

## 2016-05-10 DIAGNOSIS — M25612 Stiffness of left shoulder, not elsewhere classified: Secondary | ICD-10-CM | POA: Diagnosis not present

## 2016-05-10 DIAGNOSIS — R29898 Other symptoms and signs involving the musculoskeletal system: Secondary | ICD-10-CM | POA: Diagnosis not present

## 2016-05-14 DIAGNOSIS — Z96612 Presence of left artificial shoulder joint: Secondary | ICD-10-CM | POA: Diagnosis not present

## 2016-05-17 DIAGNOSIS — R29898 Other symptoms and signs involving the musculoskeletal system: Secondary | ICD-10-CM | POA: Diagnosis not present

## 2016-05-17 DIAGNOSIS — M25612 Stiffness of left shoulder, not elsewhere classified: Secondary | ICD-10-CM | POA: Diagnosis not present

## 2016-05-17 DIAGNOSIS — M25511 Pain in right shoulder: Secondary | ICD-10-CM | POA: Diagnosis not present

## 2016-05-17 DIAGNOSIS — Z96612 Presence of left artificial shoulder joint: Secondary | ICD-10-CM | POA: Diagnosis not present

## 2016-05-18 ENCOUNTER — Other Ambulatory Visit: Payer: Self-pay | Admitting: Family Medicine

## 2016-05-18 DIAGNOSIS — E038 Other specified hypothyroidism: Secondary | ICD-10-CM

## 2016-05-21 ENCOUNTER — Telehealth: Payer: Self-pay | Admitting: Family Medicine

## 2016-05-21 DIAGNOSIS — M25511 Pain in right shoulder: Secondary | ICD-10-CM | POA: Diagnosis not present

## 2016-05-21 DIAGNOSIS — R29898 Other symptoms and signs involving the musculoskeletal system: Secondary | ICD-10-CM | POA: Diagnosis not present

## 2016-05-21 DIAGNOSIS — Z96612 Presence of left artificial shoulder joint: Secondary | ICD-10-CM | POA: Diagnosis not present

## 2016-05-21 DIAGNOSIS — M25612 Stiffness of left shoulder, not elsewhere classified: Secondary | ICD-10-CM | POA: Diagnosis not present

## 2016-05-21 NOTE — Telephone Encounter (Signed)
Pt called because she was exposed to the flu and now has a drippy nose.  She wants to know if there is anything she can take.  Please call her to discuss.

## 2016-05-21 NOTE — Telephone Encounter (Signed)
plz call patient -  Symptoms don't sound like flu - but would still offer tamiflu ppx if exposed to flu. If she'd like, may phone in as below.   tamiflu 75mg  once daily x 10 days #10

## 2016-05-21 NOTE — Telephone Encounter (Signed)
Spoke with patient. She said she just had a RN and was sneezing, but she was around 2 people this week that tested + for the flu. She did ask for the tamiflu to be called in and said she would think about taking it if she started feeling worse. As of now, she doesn't feel bad. Rx called in as directed.

## 2016-05-24 DIAGNOSIS — R29898 Other symptoms and signs involving the musculoskeletal system: Secondary | ICD-10-CM | POA: Diagnosis not present

## 2016-05-24 DIAGNOSIS — M25511 Pain in right shoulder: Secondary | ICD-10-CM | POA: Diagnosis not present

## 2016-05-24 DIAGNOSIS — Z96612 Presence of left artificial shoulder joint: Secondary | ICD-10-CM | POA: Diagnosis not present

## 2016-05-24 DIAGNOSIS — M25612 Stiffness of left shoulder, not elsewhere classified: Secondary | ICD-10-CM | POA: Diagnosis not present

## 2016-05-26 DIAGNOSIS — K5909 Other constipation: Secondary | ICD-10-CM | POA: Diagnosis not present

## 2016-05-27 ENCOUNTER — Other Ambulatory Visit (INDEPENDENT_AMBULATORY_CARE_PROVIDER_SITE_OTHER): Payer: PPO

## 2016-05-27 DIAGNOSIS — E038 Other specified hypothyroidism: Secondary | ICD-10-CM | POA: Diagnosis not present

## 2016-05-27 LAB — TSH: TSH: 2.16 u[IU]/mL (ref 0.35–4.50)

## 2016-06-01 DIAGNOSIS — Z96612 Presence of left artificial shoulder joint: Secondary | ICD-10-CM | POA: Diagnosis not present

## 2016-06-03 DIAGNOSIS — Z96612 Presence of left artificial shoulder joint: Secondary | ICD-10-CM | POA: Diagnosis not present

## 2016-06-08 DIAGNOSIS — M25511 Pain in right shoulder: Secondary | ICD-10-CM | POA: Diagnosis not present

## 2016-06-08 DIAGNOSIS — R29898 Other symptoms and signs involving the musculoskeletal system: Secondary | ICD-10-CM | POA: Diagnosis not present

## 2016-06-08 DIAGNOSIS — M25612 Stiffness of left shoulder, not elsewhere classified: Secondary | ICD-10-CM | POA: Diagnosis not present

## 2016-06-08 DIAGNOSIS — Z96612 Presence of left artificial shoulder joint: Secondary | ICD-10-CM | POA: Diagnosis not present

## 2016-06-10 DIAGNOSIS — Z96612 Presence of left artificial shoulder joint: Secondary | ICD-10-CM | POA: Diagnosis not present

## 2016-06-18 DIAGNOSIS — M7582 Other shoulder lesions, left shoulder: Secondary | ICD-10-CM | POA: Diagnosis not present

## 2016-06-18 DIAGNOSIS — M25512 Pain in left shoulder: Secondary | ICD-10-CM | POA: Diagnosis not present

## 2016-06-18 DIAGNOSIS — M19012 Primary osteoarthritis, left shoulder: Secondary | ICD-10-CM | POA: Diagnosis not present

## 2016-07-05 ENCOUNTER — Encounter: Payer: Self-pay | Admitting: Family Medicine

## 2016-07-05 ENCOUNTER — Other Ambulatory Visit: Payer: Self-pay | Admitting: Family Medicine

## 2016-07-05 NOTE — Telephone Encounter (Signed)
Ok to refill? Last filled 03/15/16 #30 3RF

## 2016-07-06 ENCOUNTER — Other Ambulatory Visit: Payer: Self-pay | Admitting: Family Medicine

## 2016-07-06 NOTE — Telephone Encounter (Signed)
plz phone in. 

## 2016-07-06 NOTE — Telephone Encounter (Signed)
Rx called in as directed.   

## 2016-07-12 ENCOUNTER — Encounter: Payer: Self-pay | Admitting: Family Medicine

## 2016-07-14 NOTE — Telephone Encounter (Signed)
plz prepare handicap application form for patient. Indication severely limited in ambulation due to orthopedic process. Thanks.

## 2016-07-15 NOTE — Telephone Encounter (Signed)
In your IN box for completion.  

## 2016-07-16 NOTE — Telephone Encounter (Signed)
Signed and in Kim's box. 

## 2016-07-19 DIAGNOSIS — Z96612 Presence of left artificial shoulder joint: Secondary | ICD-10-CM | POA: Diagnosis not present

## 2016-07-26 DIAGNOSIS — M25511 Pain in right shoulder: Secondary | ICD-10-CM | POA: Diagnosis not present

## 2016-07-26 DIAGNOSIS — M25612 Stiffness of left shoulder, not elsewhere classified: Secondary | ICD-10-CM | POA: Diagnosis not present

## 2016-07-26 DIAGNOSIS — R29898 Other symptoms and signs involving the musculoskeletal system: Secondary | ICD-10-CM | POA: Diagnosis not present

## 2016-07-26 DIAGNOSIS — Z96612 Presence of left artificial shoulder joint: Secondary | ICD-10-CM | POA: Diagnosis not present

## 2016-07-28 DIAGNOSIS — K5909 Other constipation: Secondary | ICD-10-CM | POA: Diagnosis not present

## 2016-08-03 ENCOUNTER — Ambulatory Visit (INDEPENDENT_AMBULATORY_CARE_PROVIDER_SITE_OTHER): Payer: PPO | Admitting: Family Medicine

## 2016-08-03 ENCOUNTER — Encounter: Payer: Self-pay | Admitting: Family Medicine

## 2016-08-03 ENCOUNTER — Ambulatory Visit (INDEPENDENT_AMBULATORY_CARE_PROVIDER_SITE_OTHER)
Admission: RE | Admit: 2016-08-03 | Discharge: 2016-08-03 | Disposition: A | Discharge status: Home / Self Care | Payer: PPO | Source: Ambulatory Visit | Attending: Family Medicine | Admitting: Family Medicine

## 2016-08-03 VITALS — BP 134/78 | HR 84 | Temp 98.1°F | Wt 165.8 lb

## 2016-08-03 DIAGNOSIS — R109 Unspecified abdominal pain: Secondary | ICD-10-CM | POA: Diagnosis not present

## 2016-08-03 DIAGNOSIS — E039 Hypothyroidism, unspecified: Secondary | ICD-10-CM

## 2016-08-03 DIAGNOSIS — K581 Irritable bowel syndrome with constipation: Secondary | ICD-10-CM | POA: Diagnosis not present

## 2016-08-03 DIAGNOSIS — R1030 Lower abdominal pain, unspecified: Secondary | ICD-10-CM

## 2016-08-03 DIAGNOSIS — R194 Change in bowel habit: Secondary | ICD-10-CM

## 2016-08-03 DIAGNOSIS — R970 Elevated carcinoembryonic antigen [CEA]: Secondary | ICD-10-CM | POA: Diagnosis not present

## 2016-08-03 LAB — COMPREHENSIVE METABOLIC PANEL
ALBUMIN: 3.9 g/dL (ref 3.5–5.2)
ALK PHOS: 95 U/L (ref 39–117)
ALT: 10 U/L (ref 0–35)
AST: 17 U/L (ref 0–37)
BILIRUBIN TOTAL: 0.5 mg/dL (ref 0.2–1.2)
BUN: 8 mg/dL (ref 6–23)
CO2: 26 mEq/L (ref 19–32)
Calcium: 9.3 mg/dL (ref 8.4–10.5)
Chloride: 99 mEq/L (ref 96–112)
Creatinine, Ser: 0.94 mg/dL (ref 0.40–1.20)
GFR: 60.34 mL/min (ref >60.00)
GLUCOSE: 98 mg/dL (ref 70–99)
Potassium: 4 mEq/L (ref 3.5–5.1)
SODIUM: 133 meq/L — AB (ref 135–145)
TOTAL PROTEIN: 6.7 g/dL (ref 6.0–8.3)

## 2016-08-03 LAB — CBC WITH DIFFERENTIAL/PLATELET
BASOS ABS: 0 10*3/uL (ref 0.0–0.1)
Basophils Relative: 0.4 % (ref 0.0–3.0)
Eosinophils Absolute: 0.2 10*3/uL (ref 0.0–0.7)
Eosinophils Relative: 2.6 % (ref 0.0–5.0)
HEMATOCRIT: 40.6 % (ref 36.0–46.0)
HEMOGLOBIN: 13.4 g/dL (ref 12.0–15.0)
LYMPHS ABS: 2.2 10*3/uL (ref 0.7–4.0)
Lymphocytes Relative: 29.8 % (ref 12.0–46.0)
MCHC: 33 g/dL (ref 30.0–36.0)
MCV: 90.2 fl (ref 78.0–100.0)
Monocytes Absolute: 0.8 10*3/uL (ref 0.1–1.0)
Monocytes Relative: 11.5 % (ref 3.0–12.0)
NEUTROS ABS: 4.1 10*3/uL (ref 1.4–7.7)
NEUTROS PCT: 55.7 % (ref 43.0–77.0)
Platelets: 272 10*3/uL (ref 150.0–400.0)
RBC: 4.5 Mil/uL (ref 3.87–5.11)
RDW: 14.3 % (ref 11.5–15.5)
WBC: 7.4 10*3/uL (ref 4.0–10.5)

## 2016-08-03 LAB — TSH: TSH: 1.97 u[IU]/mL (ref 0.35–4.50)

## 2016-08-03 LAB — SEDIMENTATION RATE: Sed Rate: 30 mm/hr (ref 0–30)

## 2016-08-03 MED ORDER — LEVOTHYROXINE SODIUM 75 MCG PO TABS: 75.0000 ug | ORAL_TABLET | Freq: Every day | ORAL | 3 refills | Status: DC

## 2016-08-03 NOTE — Assessment & Plan Note (Signed)
Pt attributes increased dose to worsening joint pains - desires to return to 41mcg dose. Will continue lower dose, check TSH today.

## 2016-08-03 NOTE — Progress Notes (Signed)
BP 134/78   Pulse 84   Temp 98.1 F (36.7 C) (Oral)   Wt 165 lb 12 oz (75.2 kg)   BMI 30.32 kg/m    CC: malaise, joint pains Subjective:    Patient ID: Brooke Beasley, female    DOB: [birth year], 82 y.o.   MRN: [id number]  HPI: Brooke Beasley is a 83 y.o. female presenting on 08/03/2016 for Joint Pain and Not feeling well (no full BM in 1 week)   Known osteoarthritis and CPPD s/p shoulder replacement with revision late last year.  Endorses 82moh/o worsening joint pains, back pain, leg pains. We did increase levothyroxine from 755m to 8832min December - since then she has felt poorly and attributes to higher levothyroxine dose. However, rpt TSH 05/2016 WNL 2's. Sunday she returned to prior 71m2mose. In the interim, she did change levothyroxine manufacturers (from [employer] More trouble with bowels recently as well, acutely worse over the last 2 weeks - nausea, alternating loose stools with "marble" stools. Passing gas ok. No fevers, vomiting, blood in stool, dysuria, urgency or hematuria. Appetite down but 7 lb weight gain noted.   Saw Dr SkulGustavo Laht week for ongoing abd pain and constipation - rec increased lactulose to BID. She has been taking TID for last 2 days. Planning virtual colonoscopy. Note still pending.   CT from 03/2016 showed extensive L colonic diverticulosis without infection.  She had CEA test mildly elevated at 4.3.   Pain regimen includes tylenol 500mg78mdrocodone 5/325 1-2 tab PRN (not taking).  She also takes colchicine PRN (currently not taking) and voltaren gel PRN.   Relevant past medical, surgical, family and social history reviewed and updated as indicated. Interim medical history since our last visit reviewed. Allergies and medications reviewed and updated. Outpatient Medications Prior to Visit  Medication Sig Dispense Refill  . acetaminophen (TYLENOL) 500 MG tablet Take 500-1,000 mg by mouth at bedtime. Depends on pain    . amLODipine  (NORVASC) 5 MG tablet Take 1 tablet (5 mg total) by mouth daily. 90 tablet 3  . bimatoprost (LUMIGAN) 0.01 % SOLN Place 1 drop into both eyes at bedtime.    . BioMarland Kitchenlavonoid Products (VITAMIN C) CHEW Chew 1 tablet by mouth daily.    . Calcium Citrate (CITRACAL PO) Take 1 scoop by mouth daily.    . cholecalciferol (VITAMIN D) 1000 units tablet Take 1,000 Units by mouth daily.    . clonazePAM (KLONOPIN) 0.5 MG tablet TAKE ONE TABLET BY MOUTH AT BEDTIME AS NEEDED FOR ANXIETY 30 tablet 3  . colchicine 0.6 MG tablet Take 1 tablet (0.6 mg total) by mouth daily as needed. 90 tablet 0  . Docusate Calcium (STOOL SOFTENER PO) Take 1 tablet by mouth at bedtime.    . fish oil-omega-3 fatty acids 1000 MG capsule Take 1 g by mouth daily.     . HYDMarland KitchenOcodone-acetaminophen (NORCO) 5-325 MG tablet Take 1-2 tablets by mouth every 6 (six) hours as needed for moderate pain. MAXIMUM TOTAL ACETAMINOPHEN DOSE IS 4000 MG PER DAY 50 tablet 0  . lactulose (CHRONULAC) 10 GM/15ML solution Take 10 g by mouth 2 (two) times daily.    . Misc Natural Products (OSTEO BI-FLEX TRIPLE STRENGTH PO) Take 1 tablet by mouth daily. With Vit D    . Misc Natural Products (TART CHERRY ADVANCED PO) Take 1,000 mg by mouth daily.    . Pramox-PE-Glycerin-Petrolatum (PREPARATION H) 1-0.25-14.4-15 % CREA Use as directed 26 g  0  . pravastatin (PRAVACHOL) 20 MG tablet Take 1 tablet (20 mg total) by mouth daily. (Patient taking differently: Take 20 mg by mouth every evening. ) 90 tablet 3  . Probiotic Product (PROBIOTIC DAILY PO) Take 1 capsule by mouth daily.     . sertraline (ZOLOFT) 100 MG tablet Take 1 tablet (100 mg total) by mouth daily. 90 tablet 3  . TURMERIC PO Take 1 tablet by mouth daily.    Marland Kitchen VITAMIN E PO Take 1 tablet by mouth daily.    . VOLTAREN 1 % GEL APPLY ONE APPLICATION TOPICALLY 3 TIMES DAILY (Patient taking differently: APPLY ONE APPLICATION TOPICALLY daily as needed for shoulder pain) 100 g 3  . levothyroxine (SYNTHROID,  LEVOTHROID) 88 MCG tablet Take 1 tablet (88 mcg total) by mouth daily. 30 tablet 6   No facility-administered medications prior to visit.      Per HPI unless specifically indicated in ROS section below Review of Systems     Objective:    BP 134/78   Pulse 84   Temp 98.1 F (36.7 C) (Oral)   Wt 165 lb 12 oz (75.2 kg)   BMI 30.32 kg/m   Wt Readings from Last 3 Encounters:  08/03/16 165 lb 12 oz (75.2 kg)  04/09/16 158 lb 8 oz (71.9 kg)  03/03/16 158 lb (71.7 kg)    Physical Exam  Constitutional: She appears well-developed and well-nourished.  HENT:  Mouth/Throat: Oropharynx is clear and moist. No oropharyngeal exudate.  Neck: Normal range of motion. Neck supple. No thyromegaly present.  Cardiovascular: Normal rate, regular rhythm and intact distal pulses.   Murmur (SEM best at RUSB) heard. Pulmonary/Chest: Effort normal and breath sounds normal. No respiratory distress. She has no wheezes. She has no rales.  Abdominal: Soft. Normal appearance and bowel sounds are normal. She exhibits no distension and no mass. There is no hepatosplenomegaly. There is tenderness (mild) in the right lower quadrant and left lower quadrant. There is no rigidity, no rebound, no guarding, no CVA tenderness and negative Murphy's sign.  Musculoskeletal: She exhibits no edema.  Lymphadenopathy:    She has no cervical adenopathy.  Skin: Skin is warm and dry. No rash noted.  Psychiatric: She has a normal mood and affect.  Nursing note and vitals reviewed.  Results for orders placed or performed in visit on 08/03/16  Comprehensive metabolic panel  Result Value Ref Range   Sodium 133 (L) 135 - 145 mEq/L   Potassium 4.0 3.5 - 5.1 mEq/L   Chloride 99 96 - 112 mEq/L   CO2 26 19 - 32 mEq/L   Glucose, Bld 98 70 - 99 mg/dL   BUN 8 6 - 23 mg/dL   Creatinine, Ser 0.94 0.40 - 1.20 mg/dL   Total Bilirubin 0.5 0.2 - 1.2 mg/dL   Alkaline Phosphatase 95 39 - 117 U/L   AST 17 0 - 37 U/L   ALT 10 0 - 35 U/L    Total Protein 6.7 6.0 - 8.3 g/dL   Albumin 3.9 3.5 - 5.2 g/dL   Calcium 9.3 8.4 - 10.5 mg/dL   GFR 60.34 >60.00 mL/min  TSH  Result Value Ref Range   TSH 1.97 0.35 - 4.50 uIU/mL  CBC with Differential/Platelet  Result Value Ref Range   WBC 7.4 4.0 - 10.5 K/uL   RBC 4.50 3.87 - 5.11 Mil/uL   Hemoglobin 13.4 12.0 - 15.0 g/dL   HCT 40.6 36.0 - 46.0 %   MCV 90.2 78.0 -  100.0 fl   MCHC 33.0 30.0 - 36.0 g/dL   RDW 14.3 11.5 - 15.5 %   Platelets 272.0 150.0 - 400.0 K/uL   Neutrophils Relative % 55.7 43.0 - 77.0 %   Lymphocytes Relative 29.8 12.0 - 46.0 %   Monocytes Relative 11.5 3.0 - 12.0 %   Eosinophils Relative 2.6 0.0 - 5.0 %   Basophils Relative 0.4 0.0 - 3.0 %   Neutro Abs 4.1 1.4 - 7.7 K/uL   Lymphs Abs 2.2 0.7 - 4.0 K/uL   Monocytes Absolute 0.8 0.1 - 1.0 K/uL   Eosinophils Absolute 0.2 0.0 - 0.7 K/uL   Basophils Absolute 0.0 0.0 - 0.1 K/uL  Sedimentation rate  Result Value Ref Range   Sed Rate 30 0 - 30 mm/hr      Assessment & Plan:   Problem List Items Addressed This Visit    Bowel habit changes    Possible IBS related. I did ask her to f/u with GI for virtual colonoscopy as previously recommended - she will see if she is now eligible however does have persistent limited L shoulder ROM.       Hypothyroidism    Pt attributes increased dose to worsening joint pains - desires to return to 31mg dose. Will continue lower dose, check TSH today.       Relevant Medications   levothyroxine (SYNTHROID, LEVOTHROID) 75 MCG tablet   Other Relevant Orders   TSH (Completed)   Irritable bowel syndrome with constipation   Lower abdominal pain - Primary    Predominant concern today is ongoing lower abdominal pain along with bowel changes predominant constipation not improved despite lactulose. Check 2 view abd series to eval for obstruction. Check further labs (CBC, CMP, ESR) to further eval diverticulitis or other intra abdominal process. rec continue lactulose. Update CEA as  prior mildly elevated - despite normal CT. Encouraged f/u with GI for virtual colonoscopy.  She does have IBS history. Consider dicyclomine vs librax pending labs/xray.       Relevant Orders   Comprehensive metabolic panel (Completed)   CBC with Differential/Platelet (Completed)   Sedimentation rate (Completed)   DG Abd 2 Views (Completed)    Other Visit Diagnoses    Elevated CEA       Relevant Orders   CEA       Follow up plan: Return if symptoms worsen or fail to improve.  JRia Bush MD

## 2016-08-03 NOTE — Patient Instructions (Addendum)
Continue lactulose. If joint pains, ok to take colchicine.  Continue levothyroxine at 43mcg daily.  Labs today, xray today. We will be in touch with results.  Contact Dr Gustavo Lah to ask about virtual colonoscopy.

## 2016-08-03 NOTE — Assessment & Plan Note (Addendum)
Predominant concern today is ongoing lower abdominal pain along with bowel changes predominant constipation not improved despite lactulose. Check 2 view abd series to eval for obstruction. Check further labs (CBC, CMP, ESR) to further eval diverticulitis or other intra abdominal process. rec continue lactulose. Update CEA as prior mildly elevated - despite normal CT. Encouraged f/u with GI for virtual colonoscopy.  She does have IBS history. Consider dicyclomine vs librax pending labs/xray.

## 2016-08-03 NOTE — Progress Notes (Signed)
Pre visit review using our clinic review tool, if applicable. No additional management support is needed unless otherwise documented below in the visit note. 

## 2016-08-03 NOTE — Assessment & Plan Note (Signed)
Possible IBS related. I did ask her to f/u with GI for virtual colonoscopy as previously recommended - she will see if she is now eligible however does have persistent limited L shoulder ROM.

## 2016-08-04 LAB — CEA: CEA: 2.8 ng/mL — AB

## 2016-08-05 ENCOUNTER — Other Ambulatory Visit: Payer: Self-pay | Admitting: Gastroenterology

## 2016-08-05 DIAGNOSIS — K5909 Other constipation: Secondary | ICD-10-CM

## 2016-08-05 DIAGNOSIS — K625 Hemorrhage of anus and rectum: Secondary | ICD-10-CM

## 2016-08-06 ENCOUNTER — Encounter: Payer: Self-pay | Admitting: Family Medicine

## 2016-08-10 ENCOUNTER — Telehealth: Payer: Self-pay | Admitting: Family Medicine

## 2016-08-10 NOTE — Telephone Encounter (Signed)
Pt called to update you on her condition. She is a little better but has little relief. Still unable to pass stools, only "marbles" and still on liquid diet. She has visual colonoscopy sch tomorrow am.

## 2016-08-11 ENCOUNTER — Ambulatory Visit
Admission: RE | Admit: 2016-08-11 | Discharge: 2016-08-11 | Disposition: A | Discharge status: Home / Self Care | Payer: PPO | Source: Ambulatory Visit | Attending: Gastroenterology | Admitting: Gastroenterology

## 2016-08-11 DIAGNOSIS — K5909 Other constipation: Secondary | ICD-10-CM

## 2016-08-11 DIAGNOSIS — K625 Hemorrhage of anus and rectum: Secondary | ICD-10-CM

## 2016-08-12 NOTE — Telephone Encounter (Signed)
Pt called back. She wants you aware she had a visual colonoscopy yesterday and the report is being sent to you. She is requesting a cb to discuss plan of action.

## 2016-08-13 NOTE — Telephone Encounter (Signed)
Called pt back, spoke about results rec bland diet, frequent abmulation, prune juice, and may try dulcolax suppository.  Update next week.

## 2016-08-17 ENCOUNTER — Other Ambulatory Visit: Payer: Self-pay | Admitting: Family Medicine

## 2016-08-17 DIAGNOSIS — Z1231 Encounter for screening mammogram for malignant neoplasm of breast: Secondary | ICD-10-CM

## 2016-08-19 DIAGNOSIS — H401131 Primary open-angle glaucoma, bilateral, mild stage: Secondary | ICD-10-CM | POA: Diagnosis not present

## 2016-08-20 DIAGNOSIS — R29898 Other symptoms and signs involving the musculoskeletal system: Secondary | ICD-10-CM | POA: Diagnosis not present

## 2016-08-20 DIAGNOSIS — M25511 Pain in right shoulder: Secondary | ICD-10-CM | POA: Diagnosis not present

## 2016-08-20 DIAGNOSIS — Z96612 Presence of left artificial shoulder joint: Secondary | ICD-10-CM | POA: Diagnosis not present

## 2016-08-20 DIAGNOSIS — M25612 Stiffness of left shoulder, not elsewhere classified: Secondary | ICD-10-CM | POA: Diagnosis not present

## 2016-08-23 DIAGNOSIS — Z96612 Presence of left artificial shoulder joint: Secondary | ICD-10-CM | POA: Diagnosis not present

## 2016-08-23 DIAGNOSIS — M7582 Other shoulder lesions, left shoulder: Secondary | ICD-10-CM | POA: Diagnosis not present

## 2016-08-23 DIAGNOSIS — M75122 Complete rotator cuff tear or rupture of left shoulder, not specified as traumatic: Secondary | ICD-10-CM | POA: Diagnosis not present

## 2016-09-01 ENCOUNTER — Ambulatory Visit
Admission: RE | Admit: 2016-09-01 | Discharge: 2016-09-01 | Disposition: A | Discharge status: Home / Self Care | Payer: PPO | Source: Ambulatory Visit | Attending: Family Medicine | Admitting: Family Medicine

## 2016-09-01 DIAGNOSIS — Z1231 Encounter for screening mammogram for malignant neoplasm of breast: Secondary | ICD-10-CM | POA: Diagnosis not present

## 2016-09-01 LAB — HM MAMMOGRAPHY

## 2016-09-02 ENCOUNTER — Encounter: Payer: Self-pay | Admitting: *Deleted

## 2016-09-02 ENCOUNTER — Encounter: Payer: Self-pay | Admitting: Family Medicine

## 2016-09-23 DIAGNOSIS — K581 Irritable bowel syndrome with constipation: Secondary | ICD-10-CM | POA: Diagnosis not present

## 2016-09-25 ENCOUNTER — Encounter: Payer: Self-pay | Admitting: Family Medicine

## 2016-09-27 MED ORDER — AMLODIPINE BESYLATE 5 MG PO TABS: 5.0000 mg | ORAL_TABLET | Freq: Every day | ORAL | 0 refills | Status: DC

## 2016-09-29 ENCOUNTER — Other Ambulatory Visit: Payer: Self-pay | Admitting: Family Medicine

## 2016-09-29 NOTE — Telephone Encounter (Signed)
Last filled 09/23/2015 with no refills... Please advise if okay for pt to continue... Pt does have upcoming appt for wellness and follow up

## 2016-10-08 ENCOUNTER — Ambulatory Visit (INDEPENDENT_AMBULATORY_CARE_PROVIDER_SITE_OTHER): Payer: PPO

## 2016-10-08 ENCOUNTER — Other Ambulatory Visit: Payer: Self-pay | Admitting: Family Medicine

## 2016-10-08 VITALS — BP 118/80 | HR 69 | Temp 98.1°F | Ht 61.5 in | Wt 160.8 lb

## 2016-10-08 DIAGNOSIS — R7303 Prediabetes: Secondary | ICD-10-CM

## 2016-10-08 DIAGNOSIS — E785 Hyperlipidemia, unspecified: Secondary | ICD-10-CM | POA: Diagnosis not present

## 2016-10-08 DIAGNOSIS — Z0000 Encounter for general adult medical examination without abnormal findings: Secondary | ICD-10-CM | POA: Diagnosis not present

## 2016-10-08 LAB — HEMOGLOBIN A1C: Hgb A1c MFr Bld: 6 % (ref 4.6–6.5)

## 2016-10-08 LAB — LIPID PANEL
CHOLESTEROL: 181 mg/dL (ref 0–200)
HDL: 69.9 mg/dL (ref >39.00)
LDL CALC: 79 mg/dL (ref 0–99)
NonHDL: 111.49
TRIGLYCERIDES: 160 mg/dL — AB (ref 0.0–149.0)
Total CHOL/HDL Ratio: 3
VLDL: 32 mg/dL (ref 0.0–40.0)

## 2016-10-08 NOTE — Progress Notes (Signed)
Pre visit review using our clinic review tool, if applicable. No additional management support is needed unless otherwise documented below in the visit note. 

## 2016-10-08 NOTE — Patient Instructions (Signed)
Brooke Beasley , Thank you for taking time to come for your Medicare Wellness Visit. I appreciate your ongoing commitment to your health goals. Please review the following plan we discussed and let me know if I can assist you in the future.   These are the goals we discussed: Goals    . Increase water intake          Starting 10/08/2016, I will continue to drink at least 6-8 glasses of water daily.        This is a list of the screening recommended for you and due dates:  Health Maintenance  Topic Date Due  . DTaP/Tdap/Td vaccine (1 - Tdap) 10/23/2018*  . Flu Shot  11/24/2016  . Mammogram  09/01/2017  . Tetanus Vaccine  10/23/2018  . DEXA scan (bone density measurement)  Completed  . Pneumonia vaccines  Completed  *Topic was postponed. The date shown is not the original due date.   Preventive Care for Adults  A healthy lifestyle and preventive care can promote health and wellness. Preventive health guidelines for adults include the following key practices.  . A routine yearly physical is a good way to check with your health care provider about your health and preventive screening. It is a chance to share any concerns and updates on your health and to receive a thorough exam.  . Visit your dentist for a routine exam and preventive care every 6 months. Brush your teeth twice a day and floss once a day. Good oral hygiene prevents tooth decay and gum disease.  . The frequency of eye exams is based on your age, health, family medical history, use  of contact lenses, and other factors. Follow your health care provider's ecommendations for frequency of eye exams.  . Eat a healthy diet. Foods like vegetables, fruits, whole grains, low-fat dairy products, and lean protein foods contain the nutrients you need without too many calories. Decrease your intake of foods high in solid fats, added sugars, and salt. Eat the right amount of calories for you. Get information about a proper diet from your  health care provider, if necessary.  . Regular physical exercise is one of the most important things you can do for your health. Most adults should get at least 150 minutes of moderate-intensity exercise (any activity that increases your heart rate and causes you to sweat) each week. In addition, most adults need muscle-strengthening exercises on 2 or more days a week.  Silver Sneakers may be a benefit available to you. To determine eligibility, you may visit the website: www.silversneakers.com or contact program at 575-621-6105 Mon-Fri between 8AM-8PM.   . Maintain a healthy weight. The body mass index (BMI) is a screening tool to identify possible weight problems. It provides an estimate of body fat based on height and weight. Your health care provider can find your BMI and can help you achieve or maintain a healthy weight.   For adults 20 years and older: ? A BMI below 18.5 is considered underweight. ? A BMI of 18.5 to 24.9 is normal. ? A BMI of 25 to 29.9 is considered overweight. ? A BMI of 30 and above is considered obese.   . Maintain normal blood lipids and cholesterol levels by exercising and minimizing your intake of saturated fat. Eat a balanced diet with plenty of fruit and vegetables. Blood tests for lipids and cholesterol should begin at age 34 and be repeated every 5 years. If your lipid or cholesterol levels are high, you  are over 42, or you are at high risk for heart disease, you may need your cholesterol levels checked more frequently. Ongoing high lipid and cholesterol levels should be treated with medicines if diet and exercise are not working.  . If you smoke, find out from your health care provider how to quit. If you do not use tobacco, please do not start.  . If you choose to drink alcohol, please do not consume more than 2 drinks per day. One drink is considered to be 12 ounces (355 mL) of beer, 5 ounces (148 mL) of wine, or 1.5 ounces (44 mL) of liquor.  . If you are  67-50 years old, ask your health care provider if you should take aspirin to prevent strokes.  . Use sunscreen. Apply sunscreen liberally and repeatedly throughout the day. You should seek shade when your shadow is shorter than you. Protect yourself by wearing long sleeves, pants, a wide-brimmed hat, and sunglasses year round, whenever you are outdoors.  . Once a month, do a whole body skin exam, using a mirror to look at the skin on your back. Tell your health care provider of new moles, moles that have irregular borders, moles that are larger than a pencil eraser, or moles that have changed in shape or color.

## 2016-10-08 NOTE — Progress Notes (Signed)
PCP notes:   Health maintenance:  No gaps identified.  Abnormal screenings:   Depression score: 7  Patient concerns:   None  Nurse concerns:  None  Next PCP appt:   10/15/16 @ 1030

## 2016-10-08 NOTE — Progress Notes (Signed)
Subjective:   Brooke Beasley is a 83 y.o. female who presents for Medicare Annual (Subsequent) preventive examination.  Review of Systems:  N/A Cardiac Risk Factors include: advanced age (>24men, >19 women);obesity (BMI >30kg/m2);dyslipidemia;hypertension     Objective:     Vitals: BP 118/80 (BP Location: Right Arm, Patient Position: Sitting, Cuff Size: Normal)   Pulse 69   Temp 98.1 F (36.7 C) (Oral)   Ht 5' 1.5" (1.562 m) Comment: no shoes  Wt 160 lb 12 oz (72.9 kg)   SpO2 97%   BMI 29.88 kg/m   Body mass index is 29.88 kg/m.   Tobacco History  Smoking Status  . Never Smoker  Smokeless Tobacco  . Never Used     Counseling given: No   Past Medical History:  Diagnosis Date  . Anxiety   . Carotid stenosis 10/18/2014   R 1-39%, L 40-59%, rpt 1 yr (09/2014)   . CKD (chronic kidney disease) stage 3, GFR 30-59 ml/min 08/02/2014  . Degenerative disc disease    LS  . Depression    nervous breakdonw in 1964-out of work for a year  . Diverticulosis    severe by colonoscopy  . Family history of adverse reaction to anesthesia    n/v  . GERD (gastroesophageal reflux disease)   . Glaucoma   . Heart murmur 5/13   mitral regurge - on echo   . History of hiatal hernia   . History of shingles   . HTN (hypertension)   . Hyperlipidemia   . Hypertension   . Hypothyroidism   . IBS 11/02/2006  . Intestinal bacterial overgrowth    In small colon  . Maxillary fracture ([place]) 04/08/2012  . Osteoarthritis 2016   J[facility])  . Osteoporosis    dexa 2011  . Pseudogout 2016   shoulders ([place])   Past Surgical History:  Procedure Laterality Date  . barium enema  2012   severe diverticulosis, redundant colon  . Bowel obstruction  1999   no surgery in hosp x 3 days  . COLONOSCOPY  1. 1999  2. 11/04   1. Not finished  2. Slight hemorrhage rectosigmoid area, severe sig diverticulosis  . Dexa  1. [id number]   2. 9/04  3. 3/08    1. OP  2. OP, borderline, spine -2.44T  3.  decreased BMD-OP  . DEXA  2011   OP in spine  . DG KNEE 1-2 VIEWS BILAT     LS x-ray with degenerative disc and facet change  . ESOPHAGOGASTRODUODENOSCOPY     Negative  . Hemorrhoid procedure  4/08  . JOINT REPLACEMENT    . TONSILLECTOMY    . TOTAL SHOULDER ARTHROPLASTY Left 11/27/2015   Corky Mull, MD  . TOTAL SHOULDER REVISION Left 03/16/2016   Procedure: TOTAL SHOULDER REVISION;  Surgeon: Corky Mull, MD;  Location: [facility] ORS;  Service: Orthopedics;  Laterality: Left;  . US ECHOCARDIOGRAPHY  08/9161   Normal systolic fxn with EF 84-66%.  Focal basal septal hypertrophy.  Mild diastolic dysfunction.  Mild MR.   Family History  Problem Relation Age of Onset  . Diabetes Brother        post-op  . Coronary artery disease Brother   . Diabetes Paternal Grandfather   . Breast cancer Cousin    History  Sexual Activity  . Sexual activity: Not Currently    Outpatient Encounter Prescriptions as of 10/08/2016  Medication Sig  . acetaminophen (TYLENOL) 500 MG tablet Take 500-1,000 mg  by mouth at bedtime. Depends on pain  . amLODipine (NORVASC) 5 MG tablet Take 1 tablet (5 mg total) by mouth daily.  . bimatoprost (LUMIGAN) 0.01 % SOLN Place 1 drop into both eyes at bedtime.  Marland Kitchen Bioflavonoid Products (VITAMIN C) CHEW Chew 1 tablet by mouth daily.  . Calcium Citrate (CITRACAL PO) Take 1 scoop by mouth daily.  . cholecalciferol (VITAMIN D) 1000 units tablet Take 1,000 Units by mouth daily.  . clonazePAM (KLONOPIN) 0.5 MG tablet TAKE ONE TABLET BY MOUTH AT BEDTIME AS NEEDED FOR ANXIETY  . colchicine 0.6 MG tablet Take 1 tablet by mouth daily as needed  . Docusate Calcium (STOOL SOFTENER PO) Take 1 tablet by mouth at bedtime.  . fish oil-omega-3 fatty acids 1000 MG capsule Take 1 g by mouth daily.   Marland Kitchen HYDROcodone-acetaminophen (NORCO) 5-325 MG tablet Take 1-2 tablets by mouth every 6 (six) hours as needed for moderate pain. MAXIMUM TOTAL ACETAMINOPHEN DOSE IS 4000 MG PER DAY  . lactulose  (CHRONULAC) 10 GM/15ML solution Take 10 g by mouth 2 (two) times daily.  Marland Kitchen levothyroxine (SYNTHROID, LEVOTHROID) 75 MCG tablet Take 1 tablet by mouth every day  . linaclotide (LINZESS) 145 MCG CAPS capsule Take by mouth.  . methylcellulose oral powder Take by mouth.  . Misc Natural Products (OSTEO BI-FLEX TRIPLE STRENGTH PO) Take 1 tablet by mouth daily. With Vit D  . Pramox-PE-Glycerin-Petrolatum (PREPARATION H) 1-0.25-14.4-15 % CREA Use as directed  . pravastatin (PRAVACHOL) 20 MG tablet Take 1 tablet by mouth daily  . Probiotic Product (PROBIOTIC DAILY PO) Take 1 capsule by mouth daily.   . sertraline (ZOLOFT) 100 MG tablet Take 1 tablet by mouth daily  . TURMERIC PO Take 1 tablet by mouth daily.  Marland Kitchen VITAMIN E PO Take 1 tablet by mouth daily.  . VOLTAREN 1 % GEL APPLY ONE APPLICATION TOPICALLY 3 TIMES DAILY (Patient taking differently: APPLY ONE APPLICATION TOPICALLY daily as needed for shoulder pain)  . [DISCONTINUED] Misc Natural Products (TART CHERRY ADVANCED PO) Take 1,000 mg by mouth daily.   No facility-administered encounter medications on file as of 10/08/2016.     Activities of Daily Living In your present state of health, do you have any difficulty performing the following activities: 10/08/2016 03/03/2016  Hearing? Y N  Vision? N N  Difficulty concentrating or making decisions? N N  Walking or climbing stairs? N N  Dressing or bathing? N N  Doing errands, shopping? N N  Preparing Food and eating ? N -  Using the Toilet? N -  In the past six months, have you accidently leaked urine? N -  Do you have problems with loss of bowel control? Y -  Managing your Medications? N -  Managing your Finances? N -  Housekeeping or managing your Housekeeping? N -  Some recent data might be hidden    Patient Care Team: [name], MD as PCP - General (Family Medicine)    Assessment:    Hearing Screening Comments: Bilateral hearing aids Vision Screening Comments: Last vision  exam in April 2018 with Dr. Kieth Brightly  Exercise Activities and Dietary recommendations Current Exercise Habits: The patient does not participate in regular exercise at present, Exercise limited by: None identified  Goals    . Increase water intake          Starting 10/08/2016, I will continue to drink at least 6-8 glasses of water daily.       Fall Risk Fall Risk  10/08/2016 10/07/2015  09/26/2014 06/08/2013 03/20/2012  Falls in the past year? No No No No No   Depression Screen PHQ 2/9 Scores 10/08/2016 10/07/2015 10/07/2015 09/26/2014  PHQ - 2 Score 1 0 0 1  PHQ- 9 Score 7 - - -     Cognitive Function MMSE - Mini Mental State Exam 10/08/2016  Orientation to time 5  Orientation to Place 5  Registration 3  Attention/ Calculation 0  Recall 3  Language- name 2 objects 0  Language- repeat 1  Language- follow 3 step command 3  Language- read & follow direction 0  Write a sentence 0  Copy design 0  Total score 20     PLEASE NOTE: A Mini-Cog screen was completed. Maximum score is 20. A value of 0 denotes this part of [place] MMSE was not completed or the patient failed this part of the Mini-Cog screening.   Mini-Cog Screening Orientation to Time - Max 5 pts Orientation to Place - Max 5 pts Registration - Max 3 pts Recall - Max 3 pts Language Repeat - Max 1 pts Language Follow 3 Step Command - Max 3 pts     Immunization History  Administered Date(s) Administered  . Pneumococcal Conjugate-13 06/08/2013  . Pneumococcal Polysaccharide-23 04/26/2002  . Td 04/26/1998, 10/22/2008  . Zoster 05/11/2010   Screening Tests Health Maintenance  Topic Date Due  . DTaP/Tdap/Td (1 - Tdap) 10/23/2018 (Originally 10/23/2008)  . INFLUENZA VACCINE  11/24/2016  . MAMMOGRAM  09/01/2017  . TETANUS/TDAP  10/23/2018  . DEXA SCAN  Completed  . PNA vac Low Risk Adult  Completed      Plan:     I have personally reviewed and addressed the Medicare Annual Wellness questionnaire and have noted the  following in the patient's chart:  A. Medical and social history B. Use of alcohol, tobacco or illicit drugs  C. Current medications and supplements D. Functional ability and status E.  Nutritional status F.  Physical activity G. Advance directives H. List of other physicians I.  Hospitalizations, surgeries, and ER visits in previous 12 months J.  [place] to include hearing, vision, cognitive, depression L. Referrals and appointments - none  In addition, I have reviewed and discussed with patient certain preventive protocols, quality metrics, and best practice recommendations. A written personalized care plan for preventive services as well as general preventive health recommendations were provided to patient.  See attached scanned questionnaire for additional information.   Signed,   Lindell Noe, MHA, BS, LPN Health Coach

## 2016-10-10 NOTE — Progress Notes (Signed)
I reviewed health advisor's note, was available for consultation, and agree with documentation and plan.  

## 2016-10-15 ENCOUNTER — Encounter: Payer: Self-pay | Admitting: Family Medicine

## 2016-10-15 ENCOUNTER — Ambulatory Visit (INDEPENDENT_AMBULATORY_CARE_PROVIDER_SITE_OTHER): Payer: PPO | Admitting: Family Medicine

## 2016-10-15 VITALS — BP 150/82 | HR 78 | Ht 62.0 in | Wt 166.0 lb

## 2016-10-15 DIAGNOSIS — R011 Cardiac murmur, unspecified: Secondary | ICD-10-CM | POA: Diagnosis not present

## 2016-10-15 DIAGNOSIS — Z96612 Presence of left artificial shoulder joint: Secondary | ICD-10-CM | POA: Diagnosis not present

## 2016-10-15 DIAGNOSIS — I6529 Occlusion and stenosis of unspecified carotid artery: Secondary | ICD-10-CM

## 2016-10-15 DIAGNOSIS — K5909 Other constipation: Secondary | ICD-10-CM

## 2016-10-15 DIAGNOSIS — Z7189 Other specified counseling: Secondary | ICD-10-CM

## 2016-10-15 DIAGNOSIS — M810 Age-related osteoporosis without current pathological fracture: Secondary | ICD-10-CM | POA: Diagnosis not present

## 2016-10-15 DIAGNOSIS — Z0001 Encounter for general adult medical examination with abnormal findings: Secondary | ICD-10-CM | POA: Diagnosis not present

## 2016-10-15 DIAGNOSIS — G4700 Insomnia, unspecified: Secondary | ICD-10-CM | POA: Diagnosis not present

## 2016-10-15 DIAGNOSIS — R7303 Prediabetes: Secondary | ICD-10-CM | POA: Diagnosis not present

## 2016-10-15 DIAGNOSIS — I10 Essential (primary) hypertension: Secondary | ICD-10-CM | POA: Diagnosis not present

## 2016-10-15 DIAGNOSIS — N183 Chronic kidney disease, stage 3 unspecified: Secondary | ICD-10-CM

## 2016-10-15 DIAGNOSIS — K581 Irritable bowel syndrome with constipation: Secondary | ICD-10-CM | POA: Diagnosis not present

## 2016-10-15 DIAGNOSIS — F418 Other specified anxiety disorders: Secondary | ICD-10-CM

## 2016-10-15 DIAGNOSIS — E785 Hyperlipidemia, unspecified: Secondary | ICD-10-CM | POA: Diagnosis not present

## 2016-10-15 DIAGNOSIS — E039 Hypothyroidism, unspecified: Secondary | ICD-10-CM | POA: Diagnosis not present

## 2016-10-15 DIAGNOSIS — Z0000 Encounter for general adult medical examination without abnormal findings: Secondary | ICD-10-CM

## 2016-10-15 NOTE — Assessment & Plan Note (Signed)
Elevated today - she will start monitoring at home and return sooner if persistently elevated.

## 2016-10-15 NOTE — Patient Instructions (Addendum)
Continue good calcium intake and vitamin D daily. Consider prolia for osteoporosis - handout provided today.  You are doing well today.  Return as needed or in 6 months for follow up visit.  Start checking blood pressures at home. If consistently >150/90 return to see me sooner.   Denosumab injection What is this medicine? DENOSUMAB (den oh sue mab) slows bone breakdown. Prolia is used to treat osteoporosis in women after menopause and in men. Delton See is used to treat a high calcium level due to cancer and to prevent bone fractures and other bone problems caused by multiple myeloma or cancer bone metastases. Delton See is also used to treat giant cell tumor of the bone. This medicine may be used for other purposes; ask your health care provider or pharmacist if you have questions. COMMON BRAND NAME(S): Prolia, XGEVA What should I tell my health care provider before I take this medicine? They need to know if you have any of these conditions: -dental disease -having surgery or tooth extraction -infection -kidney disease -low levels of calcium or Vitamin D in the blood -malnutrition -on hemodialysis -skin conditions or sensitivity -thyroid or parathyroid disease -an unusual reaction to denosumab, other medicines, foods, dyes, or preservatives -pregnant or trying to get pregnant -breast-feeding How should I use this medicine? This medicine is for injection under the skin. It is given by a health care professional in a hospital or clinic setting. If you are getting Prolia, a special MedGuide will be given to you by the pharmacist with each prescription and refill. Be sure to read this information carefully each time. For Prolia, talk to your pediatrician regarding the use of this medicine in children. Special care may be needed. For Delton See, talk to your pediatrician regarding the use of this medicine in children. While this drug may be prescribed for children as young as 13 years for selected  conditions, precautions do apply. Overdosage: If you think you have taken too much of this medicine contact a poison control center or emergency room at once. NOTE: This medicine is only for you. Do not share this medicine with others. What if I miss a dose? It is important not to miss your dose. Call your doctor or health care professional if you are unable to keep an appointment. What may interact with this medicine? Do not take this medicine with any of the following medications: -other medicines containing denosumab This medicine may also interact with the following medications: -medicines that lower your chance of fighting infection -steroid medicines like prednisone or cortisone This list may not describe all possible interactions. Give your health care provider a list of all the medicines, herbs, non-prescription drugs, or dietary supplements you use. Also tell them if you smoke, drink alcohol, or use illegal drugs. Some items may interact with your medicine. What should I watch for while using this medicine? Visit your doctor or health care professional for regular checks on your progress. Your doctor or health care professional may order blood tests and other tests to see how you are doing. Call your doctor or health care professional for advice if you get a fever, chills or sore throat, or other symptoms of a cold or flu. Do not treat yourself. This drug may decrease your body's ability to fight infection. Try to avoid being around people who are sick. You should make sure you get enough calcium and vitamin D while you are taking this medicine, unless your doctor tells you not to. Discuss the foods you  eat and the vitamins you take with your health care professional. See your dentist regularly. Brush and floss your teeth as directed. Before you have any dental work done, tell your dentist you are receiving this medicine. Do not become pregnant while taking this medicine or for 5 months  after stopping it. Talk with your doctor or health care professional about your birth control options while taking this medicine. Women should inform their doctor if they wish to become pregnant or think they might be pregnant. There is a potential for serious side effects to an unborn child. Talk to your health care professional or pharmacist for more information. What side effects may I notice from receiving this medicine? Side effects that you should report to your doctor or health care professional as soon as possible: -allergic reactions like skin rash, itching or hives, swelling of the face, lips, or tongue -bone pain -breathing problems -dizziness -jaw pain, especially after dental work -redness, blistering, peeling of the skin -signs and symptoms of infection like fever or chills; cough; sore throat; pain or trouble passing urine -signs of low calcium like fast heartbeat, muscle cramps or muscle pain; pain, tingling, numbness in the hands or feet; seizures -unusual bleeding or bruising -unusually weak or tired Side effects that usually do not require medical attention (report to your doctor or health care professional if they continue or are bothersome): -constipation -diarrhea -headache -joint pain -loss of appetite -muscle pain -runny nose -tiredness -upset stomach This list may not describe all possible side effects. Call your doctor for medical advice about side effects. You may report side effects to FDA at 1-800-FDA-1088. Where should I keep my medicine? This medicine is only given in a clinic, doctor's office, or other health care setting and will not be stored at home. NOTE: This sheet is a summary. It may not cover all possible information. If you have questions about this medicine, talk to your doctor, pharmacist, or health care provider.  2018 Elsevier/Gold Standard (2016-05-04 19:17:21)

## 2016-10-15 NOTE — Assessment & Plan Note (Signed)
Chronic, stable. Continue current regimen. 

## 2016-10-15 NOTE — Assessment & Plan Note (Signed)
Advanced directives - scanned into chart 10/2015. Daughter Rebecca Newlin then Janet Moore are HCPOA. Does not want life sustaining measures if terminally ill.  

## 2016-10-15 NOTE — Assessment & Plan Note (Signed)
Discussed avoiding added sugars. 

## 2016-10-15 NOTE — Assessment & Plan Note (Signed)
Chronic, continue sertraline and klonopin.

## 2016-10-15 NOTE — Assessment & Plan Note (Signed)
Discussed calcium/vit D intake.  Discussed prolia, handout provided Pt declines rpt DEXA.

## 2016-10-15 NOTE — Progress Notes (Signed)
BP (!) 150/82   Pulse 78   Ht 5\' 2"  (1.575 m)   Wt 166 lb (75.3 kg)   SpO2 93%   BMI 30.36 kg/m   On repeat 180/80  CC: CPE Subjective:    Patient ID: Brooke Beasley, female    DOB: 17-May-1930, 83 y.o.   MRN: [id number]  HPI: Brooke Beasley is a 83 y.o. female presenting on 10/15/2016 for Annual Exam   Saw Brooke Beasley last week for medicare wellness visit. Note reviewed.  Chronic klonopin use at bedtime for insomnia.   Husband passed March 29, 2016.   Ongoing IBS predominant constipation. She had CT colonography that did not show polyp/mass/stricture. CT scan 03/2016 also reassuring. Sees Brooke Beasley. Latest trial linzess 159mcg daily - she can't tell much difference and finds it's too expensive. Planned close f/u.   Shoulder issues s/p L shoulder replacement March 29, 2016. Shoulder doing better. Limited mobility.   Preventative: Colonoscopy 2004 - severe sigmoid diverticulosis. No recent blood in stool. Taking probiotics for IBS. Barium enema Date: 2012 severe diverticulosis, redundant colon. CT colonography was ok 2018.  DEXA - 2011 osteoporosis. Pt does not take calcium but does take vit D. Declines rpt DEXA. Fosamax caused bone pain. She finds she's lost 4 inches height over the years.  Well woman - last done about 12 yrs ago. Aged out  Mammogram 08/2016 - WNL Flu shot - declines Pneumovax - 2004. prevnar 05/2013.  Td 2010 zostavax 2012 Advanced directives - scanned into chart 10/2015. Daughter Antoine Primas then Wyline Mood are HCPOA. Does not want life sustaining measures if terminally ill.  Seat belt use discussed Sunscreen use discussed. No changing moles on skin Non smoker Alcohol - none  Left handed Widow. Husband ([name]) deceased 03-29-16 from dementia. She was caregiver.  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 Interim medical history since our last visit reviewed. Allergies and medications reviewed and updated. Outpatient Medications Prior to Visit  Medication Sig Dispense Refill  . acetaminophen (TYLENOL) 500 MG tablet Take 500-1,000 mg by mouth at bedtime. Depends on pain    . amLODipine (NORVASC) 5 MG tablet Take 1 tablet (5 mg total) by mouth daily. 90 tablet 0  . bimatoprost (LUMIGAN) 0.01 % SOLN Place 1 drop into both eyes at bedtime.    Marland Kitchen Bioflavonoid Products (VITAMIN C) CHEW Chew 1 tablet by mouth daily.    . Calcium Citrate (CITRACAL PO) Take 1 scoop by mouth daily.    . cholecalciferol (VITAMIN D) 1000 units tablet Take 1,000 Units by mouth daily.    . clonazePAM (KLONOPIN) 0.5 MG tablet TAKE ONE TABLET BY MOUTH AT BEDTIME AS NEEDED FOR ANXIETY 30 tablet 3  . colchicine 0.6 MG tablet Take 1 tablet by mouth daily as needed 90 tablet 0  . Docusate Calcium (STOOL SOFTENER PO) Take 1 tablet by mouth at bedtime.    . fish oil-omega-3 fatty acids 1000 MG capsule Take 1 g by mouth daily.     Marland Kitchen HYDROcodone-acetaminophen (NORCO) 5-325 MG tablet Take 1-2 tablets by mouth every 6 (six) hours as needed for moderate pain. MAXIMUM TOTAL ACETAMINOPHEN DOSE IS 4000 MG PER DAY 50 tablet 0  . lactulose (CHRONULAC) 10 GM/15ML solution Take 10 g by mouth 2 (two) times daily.    Marland Kitchen levothyroxine (SYNTHROID, LEVOTHROID)  75 MCG tablet Take 1 tablet by mouth every day 90 tablet 0  . linaclotide (LINZESS) 145 MCG CAPS capsule Take by mouth.    . methylcellulose oral powder Take by mouth.    . Misc Natural Products (OSTEO BI-FLEX TRIPLE STRENGTH PO) Take 1 tablet by mouth daily. With Vit D    . Pramox-PE-Glycerin-Petrolatum (PREPARATION H) 1-0.25-14.4-15 % CREA Use as directed 26 g 0  . pravastatin (PRAVACHOL) 20 MG tablet Take 1 tablet by mouth daily 90 tablet 0  . Probiotic Product (PROBIOTIC DAILY PO) Take 1 capsule by mouth daily.     .  sertraline (ZOLOFT) 100 MG tablet Take 1 tablet by mouth daily 90 tablet 0  . TURMERIC PO Take 1 tablet by mouth daily.    Marland Kitchen VITAMIN E PO Take 1 tablet by mouth daily.    . VOLTAREN 1 % GEL APPLY ONE APPLICATION TOPICALLY 3 TIMES DAILY (Patient taking differently: APPLY ONE APPLICATION TOPICALLY daily as needed for shoulder pain) 100 g 3   No facility-administered medications prior to visit.      Per HPI unless specifically indicated in ROS section below Review of Systems  Constitutional: Negative for activity change, appetite change, chills, fatigue, fever and unexpected weight change.  HENT: Negative for hearing loss.   Eyes: Negative for visual disturbance.  Respiratory: Negative for cough, chest tightness, shortness of breath and wheezing.   Cardiovascular: Negative for chest pain, palpitations and leg swelling.  Gastrointestinal: Positive for abdominal pain, constipation ( +++), diarrhea and nausea. Negative for abdominal distention, blood in stool and vomiting.  Genitourinary: Negative for difficulty urinating and hematuria.  Musculoskeletal: Negative for arthralgias, myalgias and neck pain.  Skin: Negative for rash.  Neurological: Negative for dizziness, seizures, syncope and headaches (occasional).  Hematological: Negative for adenopathy. Does not bruise/bleed easily.  Psychiatric/Behavioral: Negative for dysphoric mood. The patient is not nervous/anxious.        Still grieving husband's death 03-30-2016       Objective:    BP (!) 150/82   Pulse 78   Ht 5\' 2"  (1.575 m)   Wt 166 lb (75.3 kg)   SpO2 93%   BMI 30.36 kg/m   Wt Readings from Last 3 Encounters:  10/15/16 166 lb (75.3 kg)  10/08/16 160 lb 12 oz (72.9 kg)  08/03/16 165 lb 12 oz (75.2 kg)    Physical Exam  Constitutional: She is oriented to person, place, and time. She appears well-developed and well-nourished. No distress.  HENT:  Head: Normocephalic and atraumatic.  Right Ear: Hearing, tympanic membrane,  external ear and ear canal normal.  Left Ear: Hearing, tympanic membrane, external ear and ear canal normal.  Nose: Nose normal.  Mouth/Throat: Uvula is midline, oropharynx is clear and moist and mucous membranes are normal. No oropharyngeal exudate, posterior oropharyngeal edema or posterior oropharyngeal erythema.  Eyes: Conjunctivae and EOM are normal. Pupils are equal, round, and reactive to light. No scleral icterus.  Neck: Normal range of motion. Neck supple. Carotid bruit is not present. No thyromegaly present.  Cardiovascular: Normal rate, regular rhythm and intact distal pulses.   Murmur (3/6 SEM RUSB) heard. Pulses:      Radial pulses are 2+ on the right side, and 2+ on the left side.  Pulmonary/Chest: Effort normal and breath sounds normal. No respiratory distress. She has no wheezes. She has no rales.  Abdominal: Soft. Bowel sounds are normal. She exhibits no distension and no mass. There is no tenderness. There is no rebound  and no guarding.  Musculoskeletal: Normal range of motion. She exhibits no edema.  Lymphadenopathy:    She has no cervical adenopathy.  Neurological: She is alert and oriented to person, place, and time.  CN grossly intact, station and gait intact  Skin: Skin is warm and dry. No rash noted.  Psychiatric: She has a normal mood and affect. Her behavior is normal. Judgment and thought content normal.  Nursing note and vitals reviewed.  Results for orders placed or performed in visit on 10/08/16  Lipid panel  Result Value Ref Range   Cholesterol 181 0 - 200 mg/dL   Triglycerides 160.0 (H) 0.0 - 149.0 mg/dL   HDL 69.90 >39.00 mg/dL   VLDL 32.0 0.0 - 40.0 mg/dL   LDL Cholesterol 79 0 - 99 mg/dL   Total CHOL/HDL Ratio 3    NonHDL 111.49   Hemoglobin A1c  Result Value Ref Range   Hgb A1c MFr Bld 6.0 4.6 - 6.5 %   Lab Results  Component Value Date   TSH 1.97 08/03/2016       Assessment & Plan:   Problem List Items Addressed This Visit    Advanced  care planning/counseling discussion    Advanced directives - scanned into chart 10/2015. Daughter Antoine Primas then Wyline Mood are HCPOA. Does not want life sustaining measures if terminally ill.       Anxiety associated with depression    Chronic, continue sertraline and klonopin.      Carotid stenosis    R 1-39%, L 40-59%, rpt 1 yr (09/2014) 1-39% B stenosis, no f/u needed (10/2015)      Chronic constipation    Now followed by Brooke Beasley. Appreciate their care.       CKD (chronic kidney disease) stage 3, GFR 30-59 ml/min    Actually improved on last check.       Health maintenance examination - Primary    Preventative protocols reviewed and updated unless pt declined. Discussed healthy diet and lifestyle.       Heart murmur    Stable. Presumed aortic sclerosis, known MR      HLD (hyperlipidemia)    Chronic, stable. Continue pravastatin and fish oil.       HYPERTENSION, BENIGN    Elevated today - she will start monitoring at home and return sooner if persistently elevated.      Hypothyroidism    Chronic, stable. Continue current regimen.       Insomnia    Continues nightly klonopin. Consider updated UDS.       Irritable bowel syndrome with constipation    Chronic, severe. S/p several prior Beasley evaluations. Now followed by Brooke Beasley. Appreciate their care.       Osteoporosis    Discussed calcium/vit D intake.  Discussed prolia, handout provided Pt declines rpt DEXA.       Prediabetes    Discussed avoiding added sugars.       Status post total shoulder replacement    Left shoulder s/p revision Pain has improved. Persistent limited ROM          Follow up plan: Return in about 6 months (around 04/16/2017) for follow up visit.  [name], MD

## 2016-10-15 NOTE — Assessment & Plan Note (Signed)
Actually improved on last check.

## 2016-10-15 NOTE — Assessment & Plan Note (Signed)
Stable. Presumed aortic sclerosis, known MR

## 2016-10-15 NOTE — Assessment & Plan Note (Signed)
Left shoulder s/p revision Pain has improved. Persistent limited ROM

## 2016-10-15 NOTE — Assessment & Plan Note (Signed)
R 1-39%, L 40-59%, rpt 1 yr (09/2014) 1-39% B stenosis, no f/u needed (10/2015)

## 2016-10-15 NOTE — Progress Notes (Signed)
Pre visit review using our clinic review tool, if applicable. No additional management support is needed unless otherwise documented below in the visit note. 

## 2016-10-15 NOTE — Assessment & Plan Note (Signed)
Now followed by [facility] clinic. Appreciate their care.

## 2016-10-15 NOTE — Assessment & Plan Note (Signed)
Preventative protocols reviewed and updated unless pt declined. Discussed healthy diet and lifestyle.  

## 2016-10-15 NOTE — Assessment & Plan Note (Signed)
Continues nightly klonopin. Consider updated UDS.

## 2016-10-15 NOTE — Assessment & Plan Note (Addendum)
Chronic, severe. S/p several prior GI evaluations. Now followed by [facility]. Appreciate their care.

## 2016-10-15 NOTE — Assessment & Plan Note (Addendum)
Chronic, stable. Continue pravastatin and fish oil.

## 2016-10-19 ENCOUNTER — Encounter: Payer: Self-pay | Admitting: Family Medicine

## 2016-10-20 ENCOUNTER — Telehealth: Payer: Self-pay | Admitting: Family Medicine

## 2016-10-20 ENCOUNTER — Encounter: Payer: Self-pay | Admitting: Family Medicine

## 2016-10-20 ENCOUNTER — Ambulatory Visit: Payer: PPO | Admitting: Adult Health

## 2016-10-20 NOTE — Telephone Encounter (Signed)
I spoke with [name] and pt wants to wait and see Dr Darnell Level on 10/21/16 at 4:30; cancelled appt with Dorothyann Peng NP as requested. FYI to Dr Darnell Level.

## 2016-10-20 NOTE — Telephone Encounter (Signed)
Patient Name: Brooke Beasley  DOB: [birth year]    Initial Comment Caller states that her mothers BP 188/96. She would like to schedule an appointment for today if possible. Tried to call the office directly and it said they were having system problems.   Nurse Assessment  Nurse: Mallie Mussel, RN, Alveta Heimlich Date/Time Eilene Ghazi Time): 10/20/2016 8:49:55 AM  Confirm and document reason for call. If symptomatic, describe symptoms. ---Caller states that her Mother's BP was 188/96 earlier this morning. She has been having headaches recently, off and on. She is not with her mother. She would like an appointment for her to be seen today. When she was with her this morning, she didn't have any trouble moving around.  Does the patient have any new or worsening symptoms? ---Yes  Will a triage be completed? ---Yes  Related visit to physician within the last 2 weeks? ---No  Does the PT have any chronic conditions? (i.e. diabetes, asthma, etc.) ---Yes  List chronic conditions. ---HTN, Anxiety, Reflux  Is this a behavioral health or substance abuse call? ---No     Guidelines    Guideline Title Affirmed Question Affirmed Notes  High Blood Pressure Systolic BP >= 790 OR Diastolic >= 240    Final Disposition User   See Physician within 24 Hours Mallie Mussel, RN, Alveta Heimlich    Comments  Appointment scheduled for today with Sallee Provencal FNP for today at 3:30pm. She also wanted go go ahead and shcedule her for tomorrow to be seen by Dr. Danise Mina in case she won't go to [place]. She will call and cancel whichever one she needs to. Appointment scheduled for tomorrow at 4:51mp tomorrow with Dr. Danise Mina.   Referrals  REFERRED TO PCP OFFICE   Disagree/Comply: Comply

## 2016-10-21 ENCOUNTER — Ambulatory Visit (INDEPENDENT_AMBULATORY_CARE_PROVIDER_SITE_OTHER): Payer: PPO | Admitting: Family Medicine

## 2016-10-21 ENCOUNTER — Encounter: Payer: Self-pay | Admitting: Family Medicine

## 2016-10-21 VITALS — BP 162/88 | HR 97 | Temp 97.5°F | Ht 62.0 in | Wt 168.0 lb

## 2016-10-21 DIAGNOSIS — I10 Essential (primary) hypertension: Secondary | ICD-10-CM | POA: Diagnosis not present

## 2016-10-21 DIAGNOSIS — K5909 Other constipation: Secondary | ICD-10-CM

## 2016-10-21 NOTE — Patient Instructions (Addendum)
Increase amlodipine to 10mg  daily in the morning. Continue to check blood pressures at home and let me know readings next week.  Touch base with GI about next stop for constipation. Continue miralax 1 capful daily.

## 2016-10-21 NOTE — Assessment & Plan Note (Signed)
Reviewed bowel regimen, rec continue daily miralax 1 capful daily, citracel, glycerin suppository prn. If no improvement, rec f/u with GI for further recs. linzess too expensive.

## 2016-10-21 NOTE — Assessment & Plan Note (Signed)
Deteriorated last several weeks. We will increase amlodipine to 10mg  daily and she will call me next week with home readings. Pt agrees with plan.

## 2016-10-21 NOTE — Progress Notes (Signed)
BP (!) 162/88 (BP Location: Right Arm, Cuff Size: Large)   Pulse 97   Temp 97.5 F (36.4 C) (Oral)   Ht 5\' 2"  (1.575 m)   Wt 168 lb (76.2 kg)   SpO2 97%   BMI 30.73 kg/m    CC: f/u HTN Subjective:    Patient ID: Brooke Beasley, female    DOB: March 31, 1931, 83 y.o.   MRN: [id number]  HPI: DYANE BROBERG is a 83 y.o. female presenting on 10/21/2016 for Hypertension   HTN - Compliant with current antihypertensive regimen of amlodipine 10mg  daily (higher dose started today). Does check blood pressures at home: and brings log - 140-200s/80-100. Some lightheadedness. Denies HA, vision changes, CP/tightness, SOB. Some leg swelling.  Ongoing constipation despite miralax cleanse last few days. She has been started on linzess. She is also using glycerin suppositories at night, citrucel fiber, good hydration.   In office Automatic home cuff: 163/92, 161/88 Manual office cuff by me: 162/88.  Relevant past medical, surgical, family and social history reviewed and updated as indicated. Interim medical history since our last visit reviewed. Allergies and medications reviewed and updated. Outpatient Medications Prior to Visit  Medication Sig Dispense Refill  . acetaminophen (TYLENOL) 500 MG tablet Take 500-1,000 mg by mouth at bedtime. Depends on pain    . bimatoprost (LUMIGAN) 0.01 % SOLN Place 1 drop into both eyes at bedtime.    Marland Kitchen Bioflavonoid Products (VITAMIN C) CHEW Chew 1 tablet by mouth daily.    . Calcium Citrate (CITRACAL PO) Take 1 scoop by mouth daily.    . cholecalciferol (VITAMIN D) 1000 units tablet Take 1,000 Units by mouth daily.    . clonazePAM (KLONOPIN) 0.5 MG tablet TAKE ONE TABLET BY MOUTH AT BEDTIME AS NEEDED FOR ANXIETY 30 tablet 3  . colchicine 0.6 MG tablet Take 1 tablet by mouth daily as needed 90 tablet 0  . Docusate Calcium (STOOL SOFTENER PO) Take 1 tablet by mouth at bedtime.    . fish oil-omega-3 fatty acids 1000 MG capsule Take 1 g by mouth daily.     Marland Kitchen  HYDROcodone-acetaminophen (NORCO) 5-325 MG tablet Take 1-2 tablets by mouth every 6 (six) hours as needed for moderate pain. MAXIMUM TOTAL ACETAMINOPHEN DOSE IS 4000 MG PER DAY 50 tablet 0  . lactulose (CHRONULAC) 10 GM/15ML solution Take 10 g by mouth 2 (two) times daily.    Marland Kitchen levothyroxine (SYNTHROID, LEVOTHROID) 75 MCG tablet Take 1 tablet by mouth every day 90 tablet 0  . linaclotide (LINZESS) 145 MCG CAPS capsule Take by mouth.    . methylcellulose oral powder Take by mouth.    . Misc Natural Products (OSTEO BI-FLEX TRIPLE STRENGTH PO) Take 1 tablet by mouth daily. With Vit D    . Pramox-PE-Glycerin-Petrolatum (PREPARATION H) 1-0.25-14.4-15 % CREA Use as directed 26 g 0  . pravastatin (PRAVACHOL) 20 MG tablet Take 1 tablet by mouth daily 90 tablet 0  . Probiotic Product (PROBIOTIC DAILY PO) Take 1 capsule by mouth daily.     . sertraline (ZOLOFT) 100 MG tablet Take 1 tablet by mouth daily 90 tablet 0  . TURMERIC PO Take 1 tablet by mouth daily.    Marland Kitchen VITAMIN E PO Take 1 tablet by mouth daily.    . VOLTAREN 1 % GEL APPLY ONE APPLICATION TOPICALLY 3 TIMES DAILY (Patient taking differently: APPLY ONE APPLICATION TOPICALLY daily as needed for shoulder pain) 100 g 3  . amLODipine (NORVASC) 5 MG tablet Take 1 tablet (  5 mg total) by mouth daily. 90 tablet 0   No facility-administered medications prior to visit.      Per HPI unless specifically indicated in ROS section below Review of Systems     Objective:    BP (!) 162/88 (BP Location: Right Arm, Cuff Size: Large)   Pulse 97   Temp 97.5 F (36.4 C) (Oral)   Ht 5\' 2"  (1.575 m)   Wt 168 lb (76.2 kg)   SpO2 97%   BMI 30.73 kg/m   Wt Readings from Last 3 Encounters:  10/21/16 168 lb (76.2 kg)  10/15/16 166 lb (75.3 kg)  10/08/16 160 lb 12 oz (72.9 kg)    Physical Exam  Constitutional: She appears well-developed and well-nourished. No distress.  Musculoskeletal: She exhibits edema (tr).  Psychiatric: She has a normal mood and  affect.  Nursing note and vitals reviewed.  Results for orders placed or performed in visit on 10/08/16  Lipid panel  Result Value Ref Range   Cholesterol 181 0 - 200 mg/dL   Triglycerides 160.0 (H) 0.0 - 149.0 mg/dL   HDL 69.90 >39.00 mg/dL   VLDL 32.0 0.0 - 40.0 mg/dL   LDL Cholesterol 79 0 - 99 mg/dL   Total CHOL/HDL Ratio 3    NonHDL 111.49   Hemoglobin A1c  Result Value Ref Range   Hgb A1c MFr Bld 6.0 4.6 - 6.5 %      Assessment & Plan:   Problem List Items Addressed This Visit    Chronic constipation    Reviewed bowel regimen, rec continue daily miralax 1 capful daily, citracel, glycerin suppository prn. If no improvement, rec f/u with GI for further recs. linzess too expensive.       HYPERTENSION, BENIGN - Primary    Deteriorated last several weeks. We will increase amlodipine to 10mg  daily and she will call me next week with home readings. Pt agrees with plan.       Relevant Medications   amLODipine (NORVASC) 10 MG tablet       Follow up plan: Return if symptoms worsen or fail to improve.  [name], MD

## 2016-10-26 DIAGNOSIS — L570 Actinic keratosis: Secondary | ICD-10-CM | POA: Diagnosis not present

## 2016-10-26 DIAGNOSIS — D2271 Melanocytic nevi of right lower limb, including hip: Secondary | ICD-10-CM | POA: Diagnosis not present

## 2016-10-26 DIAGNOSIS — L218 Other seborrheic dermatitis: Secondary | ICD-10-CM | POA: Diagnosis not present

## 2016-10-26 DIAGNOSIS — L308 Other specified dermatitis: Secondary | ICD-10-CM | POA: Diagnosis not present

## 2016-10-26 DIAGNOSIS — D1801 Hemangioma of skin and subcutaneous tissue: Secondary | ICD-10-CM | POA: Diagnosis not present

## 2016-10-26 DIAGNOSIS — L812 Freckles: Secondary | ICD-10-CM | POA: Diagnosis not present

## 2016-10-26 DIAGNOSIS — D2272 Melanocytic nevi of left lower limb, including hip: Secondary | ICD-10-CM | POA: Diagnosis not present

## 2016-10-26 DIAGNOSIS — L821 Other seborrheic keratosis: Secondary | ICD-10-CM | POA: Diagnosis not present

## 2016-11-01 ENCOUNTER — Encounter: Payer: Self-pay | Admitting: Family Medicine

## 2016-11-04 DIAGNOSIS — K581 Irritable bowel syndrome with constipation: Secondary | ICD-10-CM | POA: Diagnosis not present

## 2016-11-04 MED ORDER — HYDROCHLOROTHIAZIDE 12.5 MG PO CAPS: 12.5000 mg | ORAL_CAPSULE | Freq: Every day | ORAL | 6 refills | Status: DC

## 2016-11-09 ENCOUNTER — Other Ambulatory Visit: Payer: Self-pay | Admitting: Family Medicine

## 2016-11-09 NOTE — Telephone Encounter (Signed)
Last office visit 10/21/2016.  Last refilled 07/06/2016 for #30 with 3 refills.  Ok to refill?

## 2016-11-09 NOTE — Telephone Encounter (Signed)
Pt left v/m requesting status of clonazepam refill. 

## 2016-11-10 ENCOUNTER — Encounter: Payer: Self-pay | Admitting: Family Medicine

## 2016-11-10 ENCOUNTER — Other Ambulatory Visit: Payer: Self-pay | Admitting: Family Medicine

## 2016-11-10 NOTE — Telephone Encounter (Signed)
Clonazepam called into [facility] - [place], [place] - [address] Phone: [phone].  

## 2016-11-10 NOTE — Telephone Encounter (Signed)
Called in & was told by Marita Kansas (pharmacist) that [facility] has already called in refills

## 2016-11-10 NOTE — Telephone Encounter (Signed)
plz phone in. 

## 2016-11-23 ENCOUNTER — Encounter: Payer: Self-pay | Admitting: Family Medicine

## 2016-11-23 DIAGNOSIS — I10 Essential (primary) hypertension: Secondary | ICD-10-CM

## 2016-11-24 MED ORDER — AMLODIPINE BESYLATE 10 MG PO TABS: 10.0000 mg | ORAL_TABLET | Freq: Every day | ORAL | 1 refills | Status: DC

## 2016-11-29 ENCOUNTER — Encounter: Payer: Self-pay | Admitting: Family Medicine

## 2016-12-10 ENCOUNTER — Other Ambulatory Visit (INDEPENDENT_AMBULATORY_CARE_PROVIDER_SITE_OTHER): Payer: PPO

## 2016-12-10 DIAGNOSIS — I10 Essential (primary) hypertension: Secondary | ICD-10-CM | POA: Diagnosis not present

## 2016-12-10 LAB — BASIC METABOLIC PANEL
BUN: 9 mg/dL (ref 6–23)
CALCIUM: 9.3 mg/dL (ref 8.4–10.5)
CHLORIDE: 93 meq/L — AB (ref 96–112)
CO2: 30 meq/L (ref 19–32)
Creatinine, Ser: 0.92 mg/dL (ref 0.40–1.20)
GFR: 61.8 mL/min (ref >60.00)
Glucose, Bld: 105 mg/dL — ABNORMAL HIGH (ref 70–99)
Potassium: 3.4 mEq/L — ABNORMAL LOW (ref 3.5–5.1)
SODIUM: 132 meq/L — AB (ref 135–145)

## 2016-12-11 ENCOUNTER — Other Ambulatory Visit: Payer: Self-pay | Admitting: Family Medicine

## 2016-12-11 MED ORDER — POTASSIUM CHLORIDE ER 10 MEQ PO TBCR: 10.0000 meq | EXTENDED_RELEASE_TABLET | Freq: Every day | ORAL | 6 refills | Status: DC

## 2016-12-16 DIAGNOSIS — K581 Irritable bowel syndrome with constipation: Secondary | ICD-10-CM | POA: Diagnosis not present

## 2016-12-29 ENCOUNTER — Encounter: Payer: Self-pay | Admitting: Family Medicine

## 2016-12-29 ENCOUNTER — Ambulatory Visit (INDEPENDENT_AMBULATORY_CARE_PROVIDER_SITE_OTHER): Payer: PPO | Admitting: Family Medicine

## 2016-12-29 VITALS — BP 150/84 | HR 78 | Temp 97.5°F | Wt 168.2 lb

## 2016-12-29 DIAGNOSIS — R600 Localized edema: Secondary | ICD-10-CM | POA: Diagnosis not present

## 2016-12-29 DIAGNOSIS — I10 Essential (primary) hypertension: Secondary | ICD-10-CM

## 2016-12-29 MED ORDER — SERTRALINE HCL 100 MG PO TABS: 100.0000 mg | ORAL_TABLET | Freq: Every day | ORAL | 3 refills | Status: DC

## 2016-12-29 MED ORDER — PRAVASTATIN SODIUM 20 MG PO TABS: 20.0000 mg | ORAL_TABLET | Freq: Every day | ORAL | 3 refills | Status: DC

## 2016-12-29 MED ORDER — POTASSIUM CHLORIDE ER 10 MEQ PO TBCR: 10.0000 meq | EXTENDED_RELEASE_TABLET | Freq: Every day | ORAL | 3 refills | Status: DC | PRN

## 2016-12-29 MED ORDER — FUROSEMIDE 20 MG PO TABS: 20.0000 mg | ORAL_TABLET | Freq: Every day | ORAL | 3 refills | Status: DC | PRN

## 2016-12-29 NOTE — Assessment & Plan Note (Signed)
Today exam normal. Reassured patient. Largely resolved with home treatment of leg elevation and compression stockings.

## 2016-12-29 NOTE — Assessment & Plan Note (Signed)
Mildly elevated but appropriate for age although she does have CKD history (latest Cr was stable). She remains worried about tolerance of hctz. Will stop HCTZ and start lasix 20mg  QD PRN pedal edema, change Kdur 56mEq to QD prn when lasix taken. She agrees with plan.

## 2016-12-29 NOTE — Progress Notes (Signed)
BP (!) 150/84 (BP Location: Right Arm, Cuff Size: Large)   Pulse 78   Temp (!) 97.5 F (36.4 C) (Oral)   Wt 168 lb 4 oz (76.3 kg)   SpO2 91%   BMI 30.77 kg/m    CC: leg swelling Subjective:    Patient ID: Brooke Beasley, female    DOB: 14-Sep-1930, 83 y.o.   MRN: [id number]  HPI: Brooke Beasley is a 83 y.o. female presenting on 12/29/2016 for Leg Swelling   Niece came to visit - out on town on her feet more than usual last week. Significantly increased leg swelling noted several days afterwards. She did have skin turning red from below knees to toes associated with itching, without warmth or tenderness. Concern for cellulitis so here to be checked out. She treated with compression stockings. She has been off her feet last several days with significant improvement. Today is the best the legs have looked in the last 2 months.   Recently underwent normal anal manometry ([facility]yn) and recommended pelvic floor PT for chronic constipation with IBS.   Relevant past medical, surgical, family and social history reviewed and updated as indicated. Interim medical history since our last visit reviewed. Allergies and medications reviewed and updated. Outpatient Medications Prior to Visit  Medication Sig Dispense Refill  . acetaminophen (TYLENOL) 500 MG tablet Take 500-1,000 mg by mouth at bedtime. Depends on pain    . amLODipine (NORVASC) 10 MG tablet Take 1 tablet (10 mg total) by mouth daily. 90 tablet 1  . bimatoprost (LUMIGAN) 0.01 % SOLN Place 1 drop into both eyes at bedtime.    Marland Kitchen Bioflavonoid Products (VITAMIN C) CHEW Chew 1 tablet by mouth daily.    . Calcium Citrate (CITRACAL PO) Take 1 scoop by mouth daily.    . cholecalciferol (VITAMIN D) 1000 units tablet Take 1,000 Units by mouth daily.    . clonazePAM (KLONOPIN) 0.5 MG tablet TAKE 1 TABLET BY MOUTH AT BEDTIME AS NEEDED FOR ANXIETY 30 tablet 3  . colchicine 0.6 MG tablet Take 1 tablet by mouth daily as needed 90 tablet 0  .  Docusate Calcium (STOOL SOFTENER PO) Take 1 tablet by mouth at bedtime.    . fish oil-omega-3 fatty acids 1000 MG capsule Take 1 g by mouth daily.     Marland Kitchen HYDROcodone-acetaminophen (NORCO) 5-325 MG tablet Take 1-2 tablets by mouth every 6 (six) hours as needed for moderate pain. MAXIMUM TOTAL ACETAMINOPHEN DOSE IS 4000 MG PER DAY 50 tablet 0  . lactulose (CHRONULAC) 10 GM/15ML solution Take 10 g by mouth 2 (two) times daily.    Marland Kitchen levothyroxine (SYNTHROID, LEVOTHROID) 75 MCG tablet Take 1 tablet by mouth every day 90 tablet 0  . methylcellulose oral powder Take by mouth.    . Misc Natural Products (OSTEO BI-FLEX TRIPLE STRENGTH PO) Take 1 tablet by mouth daily. With Vit D    . Pramox-PE-Glycerin-Petrolatum (PREPARATION H) 1-0.25-14.4-15 % CREA Use as directed 26 g 0  . Probiotic Product (PROBIOTIC DAILY PO) Take 1 capsule by mouth daily.     . TURMERIC PO Take 1 tablet by mouth daily.    Marland Kitchen VITAMIN E PO Take 1 tablet by mouth daily.    . VOLTAREN 1 % GEL APPLY ONE APPLICATION TOPICALLY 3 TIMES DAILY (Patient taking differently: APPLY ONE APPLICATION TOPICALLY daily as needed for shoulder pain) 100 g 3  . hydrochlorothiazide (MICROZIDE) 12.5 MG capsule Take 1 capsule (12.5 mg total) by mouth daily. Meagher  capsule 6  . potassium chloride (K-DUR) 10 MEQ tablet Take 1 tablet (10 mEq total) by mouth daily. 30 tablet 6  . pravastatin (PRAVACHOL) 20 MG tablet Take 1 tablet by mouth daily 90 tablet 0  . sertraline (ZOLOFT) 100 MG tablet Take 1 tablet by mouth daily 90 tablet 0  . linaclotide (LINZESS) 145 MCG CAPS capsule Take by mouth.     No facility-administered medications prior to visit.      Per HPI unless specifically indicated in ROS section below Review of Systems     Objective:    BP (!) 150/84 (BP Location: Right Arm, Cuff Size: Large)   Pulse 78   Temp (!) 97.5 F (36.4 C) (Oral)   Wt 168 lb 4 oz (76.3 kg)   SpO2 91%   BMI 30.77 kg/m   Wt Readings from Last 3 Encounters:  12/29/16 168  lb 4 oz (76.3 kg)  10/21/16 168 lb (76.2 kg)  10/15/16 166 lb (75.3 kg)    Physical Exam  Constitutional: She appears well-developed and well-nourished. No distress.  HENT:  Mouth/Throat: Oropharynx is clear and moist. No oropharyngeal exudate.  Cardiovascular: Normal rate, regular rhythm and intact distal pulses.   Murmur (mild systolic) heard. Pulmonary/Chest: Effort normal and breath sounds normal. No respiratory distress. She has no wheezes. She has no rales.  Musculoskeletal: She exhibits no edema.  2+ DP bilaterally  Skin: Skin is warm and dry. No rash noted.  No significant rash on legs today  Psychiatric: She has a normal mood and affect.  Nursing note and vitals reviewed.  Results for orders placed or performed in visit on 16/10/96  Basic metabolic panel  Result Value Ref Range   Sodium 132 (L) 135 - 145 mEq/L   Potassium 3.4 (L) 3.5 - 5.1 mEq/L   Chloride 93 (L) 96 - 112 mEq/L   CO2 30 19 - 32 mEq/L   Glucose, Bld 105 (H) 70 - 99 mg/dL   BUN 9 6 - 23 mg/dL   Creatinine, Ser 0.92 0.40 - 1.20 mg/dL   Calcium 9.3 8.4 - 10.5 mg/dL   GFR 61.80 >60.00 mL/min      Assessment & Plan:   Problem List Items Addressed This Visit    HYPERTENSION, BENIGN    Mildly elevated but appropriate for age although she does have CKD history (latest Cr was stable). She remains worried about tolerance of hctz. Will stop HCTZ and start lasix 20mg  QD PRN pedal edema, change Kdur 7mEq to QD prn when lasix taken. She agrees with plan.       Relevant Medications   furosemide (LASIX) 20 MG tablet   pravastatin (PRAVACHOL) 20 MG tablet   Pedal edema - Primary    Today exam normal. Reassured patient. Largely resolved with home treatment of leg elevation and compression stockings.           Follow up plan: Return if symptoms worsen or fail to improve.  [name], MD

## 2016-12-29 NOTE — Patient Instructions (Addendum)
Stop hydrochlorothiazide. Start lasix 20mg  daily as needed for leg swelling. Take potassium when you take lasix.  Legs are looking good today.

## 2017-01-27 ENCOUNTER — Ambulatory Visit: Payer: PPO | Attending: Gastroenterology | Admitting: Physical Therapy

## 2017-01-27 DIAGNOSIS — R29898 Other symptoms and signs involving the musculoskeletal system: Secondary | ICD-10-CM | POA: Diagnosis not present

## 2017-01-27 DIAGNOSIS — R2689 Other abnormalities of gait and mobility: Secondary | ICD-10-CM | POA: Diagnosis not present

## 2017-01-27 DIAGNOSIS — R278 Other lack of coordination: Secondary | ICD-10-CM | POA: Insufficient documentation

## 2017-01-27 DIAGNOSIS — M791 Myalgia, unspecified site: Secondary | ICD-10-CM | POA: Insufficient documentation

## 2017-01-27 NOTE — Patient Instructions (Signed)
     Seated stretch:   one leg out one a a time  10 reps x 3

## 2017-01-28 NOTE — Therapy (Signed)
[place] MAIN [facility] SERVICES [address] [place], [place], [address] Phone: [phone]   Fax:  [phone]  Physical Therapy Evaluation  Patient Details  Name: Brooke Beasley MRN: [id number] Date of Birth: [birth year] Referring Provider: Stephens November  Encounter Date: 01/27/2017    Past Medical History:  Diagnosis Date  . Anxiety   . Carotid stenosis 10/18/2014   R 1-39%, L 40-59%, rpt 1 yr (09/2014)   . CKD (chronic kidney disease) stage 3, GFR 30-59 ml/min 08/02/2014  . Degenerative disc disease    LS  . Depression    nervous breakdonw in 1964-out of work for a year  . Diverticulosis    severe by colonoscopy  . Family history of adverse reaction to anesthesia    n/v  . GERD (gastroesophageal reflux disease)   . Glaucoma   . Heart murmur 5/13   mitral regurge - on echo   . History of hiatal hernia   . History of shingles   . HTN (hypertension)   . Hyperlipidemia   . Hypertension   . Hypothyroidism   . IBS 11/02/2006  . Intestinal bacterial overgrowth    In small colon  . Maxillary fracture ([place]) 04/08/2012  . Osteoarthritis 2016   Jefm [facility])  . Osteoporosis    dexa 2011  . Pseudogout 2016   shoulders ([place])    Past Surgical History:  Procedure Laterality Date  . barium enema  2012   severe diverticulosis, redundant colon  . Bowel obstruction  1999   no surgery in hosp x 3 days  . COLONOSCOPY  1. 1999  2. 11/04   1. Not finished  2. Slight hemorrhage rectosigmoid area, severe sig diverticulosis  . Dexa  1. [id number]   2. 9/04  3. 3/08    1. OP  2. OP, borderline, spine -2.44T  3. decreased BMD-OP  . DEXA  2011   OP in spine  . DG KNEE 1-2 VIEWS BILAT     LS x-ray with degenerative disc and facet change  . ESOPHAGOGASTRODUODENOSCOPY     Negative  . Hemorrhoid procedure  4/08  . JOINT REPLACEMENT    . TONSILLECTOMY    . TOTAL SHOULDER ARTHROPLASTY Left 11/27/2015   Corky Mull, MD  . TOTAL SHOULDER REVISION  Left 03/16/2016   Procedure: TOTAL SHOULDER REVISION;  Surgeon: Corky Mull, MD;  Location: [facility] ORS;  Service: Orthopedics;  Laterality: Left;  . US ECHOCARDIOGRAPHY  12/7671   Normal systolic fxn with EF 41-93%.  Focal basal septal hypertrophy.  Mild diastolic dysfunction.  Mild MR.    There were no vitals filed for this visit.       Subjective Assessment - 01/28/17 1801    Subjective 1) constipation: Pt reports she had constipation on and off all of her life. Pt states her GI doctor told her that she had diverticulitis. Pt only had a visual scan done and not a colonscopy because her intestines are wrapped around and around. "This is the only way way I know to explain it".  Pt's last bowel movement around 4 pm yesterday with a laxative. They were small balls. When they do not come out, she gets feeling bad. Sometimes her stomach blows up and gets so hard, she can hardly stand it. Pt reports Bristol Type 1-4 with a majority of the time being Type 1.  Daily fluid intake: water 6-8 glasses of water, 2 glasses of tea, 2 cups of coffee.  She eats  veggies at lunch.  She can not eat broccoli, lettuce, slaw. Pt has not worked with a nutritionist. Pt has hemorrhoids and using creams.    2) urinary and fecal incontinece and incomplete emptying: Pt reports she can not make it to the bathroom in time .  Sometimes, pt will pass urine and then she will have a little more. " I think when I feel so bloated with the little balls, they are pushing against my bladder and it makes it hard to pee."    3) CLBP: When she gets real full and bloated, her back feels stiff. Her back hurts right now. 1-2/10.  sometimes, the pain travels down her R leg on the side to the side ankle.       Pertinent History 2 vaginal deliveries with episiotomy. "When I had my first one, they sewed me up. I had an infection" .  Hobbies: work in the yard, pull weeds. Pt has not felt like doing this. Pt 's husband passed away 1 year ago. Dtr and  her husband will be moving in with her soon.     Patient Stated Goals "I do not get around much because of this"  " I would like to feeling better, like a human being. I feel like I am dragging and full of poo all the time."              [facility] PT Assessment - 01/28/17 1802      Assessment   Medical Diagnosis constipation with IBS    Referring Provider Jacqulyn Liner, Christiane     Precautions   Precautions None     Restrictions   Weight Bearing Restrictions No     Balance Screen   Has the patient fallen in the past 6 months No     Prior Function   Level of Independence Independent  dtr and husband will be moving in with her      Observation/Other Assessments   Observations L shoulder lower than R,  (Hx of L shoulder surgery)      Coordination   Gross Motor Movements are Fluid and Coordinated --  abdominal straining with cue for pelvic floor      Posture/Postural Control   Posture Comments lumbopelvic perturbation with leg movement     AROM   Overall AROM Comments WNF all directions              Objective measurements completed on examination: See above findings.        Pelvic Floor Special Questions - 01/28/17 1805    Pelvic Floor Internal Exam pt consented verbally without contraindications after PT explained details to exam    Exam Type Vaginal   Palpation severely restrictions scar spanning 5-7 o' clock with tenderness/ tightness along 10 -2 o clock along 1-2 nd layers . NOted paradoxical breathing, inhalation with pelvic floor contraction . excessive cues needed to correct. Pt demo'd correctly post Tx           [facility] Adult PT Treatment/Exercise - 01/28/17 1802      Neuro Re-ed    Neuro Re-ed Details  see pt instructions      Manual Therapy   Internal Pelvic Floor scar releases 5-7 o clock, iliococcygeus mm B                 PT Education - 01/28/17 1806    Education provided Yes   Education Details POC, anatomy. physiology, goals, HEP    Person(s) Educated Patient  Methods Explanation;Demonstration;Tactile cues;Verbal cues;Handout   Comprehension Returned demonstration;Verbalized understanding             PT Long Term Goals - 02-07-2017 0929      PT LONG TERM GOAL #1   Title Pt will report increased "good days" per month from 7 days to > 14 days in order to improve QOL   Time 12   Period Weeks   Status New   Target Date 04/21/17     PT LONG TERM GOAL #2   Title Pt will demo decreased score on COREFO  from % to <  % in order to restore bowel function   Time 12   Period Weeks   Status New     PT LONG TERM GOAL #3   Title Pt will demo decreased perineal scar restrictions in order to lengthen pelvic floor mm to eliminate completely   Time 4   Period Weeks   Status New     PT LONG TERM GOAL #4   Title Pt will demo proper coordination of pelvic floor without abdominal straining in order to minimize pelvic floor dysfunction and improve QOL   Time 8   Period Weeks     PT LONG TERM GOAL #5   Title Pt will report improved stool consistency based on Bristol Stool Type ( from 1 to 3-4) in order to minimize hemorrhoids   Time 10   Period Weeks   Status New                Plan - 01/28/17 1814    Clinical Impression Statement Pt is an 83 yo female who reports constipation, fecal/urine incontinence, and CLPB. These deficits impact her QOL and ADLs .Pt 's clinical presentations include dyscoordination of pelvic floor and other deep core mm with demonstration of abdominal mm overuse and straining in addition to severely restrictions of perineal scars and pelvic floor tensions/ tenderness .Following Tx today, pt achieved increased pelvic floor lengthening and proper coordination of deep core mm.    Rehab Potential Good   PT Frequency 1x / week   PT Duration 12 weeks   PT Treatment/Interventions ADLs/Self Care Home Management;Gait training;Stair training;Moist Heat;Traction;Therapeutic activities;Therapeutic  exercise;Neuromuscular re-education;Patient/family education;Manual techniques;Energy conservation;Passive range of motion;Dry needling;Taping;Balance training   Consulted and Agree with Plan of Care Patient      Patient will benefit from skilled therapeutic intervention in order to improve the following deficits and impairments:  Improper body mechanics, Pain, Postural dysfunction, Decreased strength, Decreased safety awareness, Decreased coordination, Decreased activity tolerance, Decreased scar mobility, Decreased range of motion, Difficulty walking, Decreased mobility, Decreased endurance, Decreased balance, Increased muscle spasms, Hypomobility, Increased fascial restricitons, Impaired flexibility  Visit Diagnosis: Myalgia  Other symptoms and signs involving the musculoskeletal system  Other lack of coordination      G-Codes - February 07, 2017 1812    Functional Assessment Tool Used (Outpatient Only) clinical judegment , COREFO    Functional Limitation Self care   Self Care Current Status (E2683) At least 40 percent but less than 60 percent impaired, limited or restricted   Self Care Goal Status (M1962) At least 20 percent but less than 40 percent impaired, limited or restricted       Problem List Patient Active Problem List   Diagnosis Date Noted  . Pedal edema 12/29/2016  . BRBPR (bright red blood per rectum) 04/09/2016  . Weakness 04/09/2016  . Sleep disturbance 04/09/2016  . Bowel habit changes 01/13/2016  . Hemorrhoids, external 01/13/2016  .  Status post total shoulder replacement 11/27/2015  . Health maintenance examination 10/07/2015  . Pseudogout   . Left shoulder pain 11/04/2014  . Carotid stenosis 10/18/2014  . Advanced care planning/counseling discussion 09/26/2014  . Diverticulosis of colon without hemorrhage 09/26/2014  . CKD (chronic kidney disease) stage 3, GFR 30-59 ml/min (HCC) 08/02/2014  . Restless legs 07/04/2014  . Dizziness 06/13/2014  . Lower abdominal  pain 01/07/2014  . Primary osteoarthritis of both hands 11/21/2013  . Insomnia 10/23/2013  . Medicare annual wellness visit, subsequent 06/08/2013  . GERD (gastroesophageal reflux disease) 04/02/2013  . Anxiety associated with depression 12/26/2012  . Syncope due to orthostatic hypotension 04/08/2012  . Prediabetes 03/20/2012  . Heart murmur 09/13/2011  . Syncope 08/02/2011  . HYPERTENSION, BENIGN 02/24/2007  . Hypothyroidism 11/02/2006  . HLD (hyperlipidemia) 11/02/2006  . Glaucoma 11/02/2006  . HEMORRHOIDS, INTERNAL 11/02/2006  . Chronic constipation 11/02/2006  . Irritable bowel syndrome with constipation 11/02/2006  . Osteoporosis 11/02/2006  . Osteoarthritis 10/20/2006    Jerl Mina ,PT, DPT, E-RYT  01/28/2017, 6:17 PM  [place] MAIN [facility] SERVICES [address] [place], [place], [address] Phone: [phone]   Fax:  [phone]  Name: KIZZIE COTTEN MRN: [id number] Date of Birth: [birth year]

## 2017-02-07 ENCOUNTER — Ambulatory Visit: Payer: PPO | Admitting: Physical Therapy

## 2017-02-07 DIAGNOSIS — M791 Myalgia, unspecified site: Secondary | ICD-10-CM | POA: Diagnosis not present

## 2017-02-07 DIAGNOSIS — R278 Other lack of coordination: Secondary | ICD-10-CM

## 2017-02-07 DIAGNOSIS — R29898 Other symptoms and signs involving the musculoskeletal system: Secondary | ICD-10-CM

## 2017-02-07 NOTE — Patient Instructions (Signed)
Stretches for R low back due to scoliosis curve    Wall:   Standing 90 deg to the wall , R forearm up and down to strech R low back and flank     10 reps    Or Seated: R hand on shoulder, lean to L to elbow up to the ceiling to lengthen the R side body  10 reps       _________________________  Strengthen the R low back  By the counter    R hand on counter, bend elbow Lean R hip and push away the body  10 reps x 2        ____  Wear the shoe lift on L shoe

## 2017-02-08 ENCOUNTER — Encounter: Payer: Self-pay | Admitting: Family Medicine

## 2017-02-08 NOTE — Therapy (Signed)
[place] MAIN [facility] SERVICES [address] [place], [place], [address] Phone: [phone]   Fax:  [phone]  Physical Therapy Treatment  Patient Details  Name: Brooke Beasley MRN: [id number] Date of Birth: July 10, 1930 Referring Provider: Stephens November  Encounter Date: 02/07/2017      PT End of Session - 02/08/17 1037    Visit Number 2   Number of Visits 12   Authorization Type g code    PT Start Time 1100   PT Stop Time 1215   PT Time Calculation (min) 75 min      Past Medical History:  Diagnosis Date  . Anxiety   . Carotid stenosis 10/18/2014   R 1-39%, L 40-59%, rpt 1 yr (09/2014)   . CKD (chronic kidney disease) stage 3, GFR 30-59 ml/min 08/02/2014  . Degenerative disc disease    LS  . Depression    nervous breakdonw in 1964-out of work for a year  . Diverticulosis    severe by colonoscopy  . Family history of adverse reaction to anesthesia    n/v  . GERD (gastroesophageal reflux disease)   . Glaucoma   . Heart murmur 5/13   mitral regurge - on echo   . History of hiatal hernia   . History of shingles   . HTN (hypertension)   . Hyperlipidemia   . Hypertension   . Hypothyroidism   . IBS 11/02/2006  . Intestinal bacterial overgrowth    In small colon  . Maxillary fracture ([place]) 04/08/2012  . Osteoarthritis 2016   [facility])  . Osteoporosis    dexa 2011  . Pseudogout 2016   shoulders ([place])    Past Surgical History:  Procedure Laterality Date  . barium enema  2012   severe diverticulosis, redundant colon  . Bowel obstruction  1999   no surgery in hosp x 3 days  . COLONOSCOPY  1. 1999  2. 11/04   1. Not finished  2. Slight hemorrhage rectosigmoid area, severe sig diverticulosis  . Dexa  1. 1950-9326   2. 9/04  3. 3/08    1. OP  2. OP, borderline, spine -2.44T  3. decreased BMD-OP  . DEXA  2011   OP in spine  . DG KNEE 1-2 VIEWS BILAT     LS x-ray with degenerative disc and facet change  .  ESOPHAGOGASTRODUODENOSCOPY     Negative  . Hemorrhoid procedure  4/08  . JOINT REPLACEMENT    . TONSILLECTOMY    . TOTAL SHOULDER ARTHROPLASTY Left 11/27/2015   Corky Mull, MD  . TOTAL SHOULDER REVISION Left 03/16/2016   Procedure: TOTAL SHOULDER REVISION;  Surgeon: Corky Mull, MD;  Location: [facility] ORS;  Service: Orthopedics;  Laterality: Left;  . US ECHOCARDIOGRAPHY  10/1243   Normal systolic fxn with EF 80-99%.  Focal basal septal hypertrophy.  Mild diastolic dysfunction.  Mild MR.    There were no vitals filed for this visit.      Subjective Assessment - 02/07/17 1110    Subjective Pt reported she still had a real hard time with bowel movements. She had blow outs on Fri, Sat, Sunday. Before she had soreness in the R hip with pain shooting down the R leg ( back to heel).  After the blow outs, the sorenss and shooting pain went away.  This soreness is not bad today.  Pt states she has a lot of mucus from the rectum and she would like to see a  doctor.      Pertinent History 2 vaginal deliveries with episiotomy. "When I had my first one, they sewed me up. I had an infection" .  Hobbies: work in the yard, pull weeds. Pt has not felt like doing this. Pt 's husband passed away 1 year ago. Dtr and her husband will be moving in with her soon.     Patient Stated Goals "I do not get around much because of this"  " I would like to feeling better, like a human being. I feel like I am dragging and full of poo all the time."              [facility] PT Assessment - 02/08/17 1031      Assessment   Medical Diagnosis constipation with IBS      Precautions   Precautions None     Restrictions   Weight Bearing Restrictions No     Prior Function   Level of Independence Independent  dtr and husband will be moving in with her      Observation/Other Assessments   Observations       Palpation   Spinal mobility L lumbar curve, R PSIS lower in stance, R ASIS more posterior in supine and with sorenes  in L lower back    85 cm, R ASIS to medial malleoli, 84 cm L    SI assessment  R SIJ hypomobility into hip ext                      [facility] Adult PT Treatment/Exercise - 02/08/17 1031      Ambulation/Gait   Gait Comments without shoe lift ( R trunk lean) with shoe lift on L ( no trunk lean, shoulders levelled(      Exercises   Exercises --  see pt instructions      Manual Therapy   Internal Pelvic Floor R long axis distraction, inferior / superior sacral mob MWM with hip abd ER                 PT Education - 02/08/17 1032    Education provided Yes   Education Details HEP   Person(s) Educated Patient   Methods Explanation;Demonstration;Tactile cues;Handout;Verbal cues   Comprehension Returned demonstration;Verbalized understanding             PT Long Term Goals - 01/27/17 0929      PT LONG TERM GOAL #1   Title Pt will report increased "good days" per month from 7 days to > 14 days in order to improve QOL   Time 12   Period Weeks   Status New   Target Date 04/21/17     PT LONG TERM GOAL #2   Title Pt will demo decreased score on COREFO  from 32 % to < 27 % in order to restore bowel function   Time 12   Period Weeks   Status New     PT LONG TERM GOAL #3   Title Pt will demo decreased perineal scar restrictions in order to lengthen pelvic floor mm to eliminate completely   Time 4   Period Weeks   Status New     PT LONG TERM GOAL #4   Title Pt will demo proper coordination of pelvic floor without abdominal straining in order to minimize pelvic floor dysfunction and improve QOL   Time 8   Period Weeks     PT LONG TERM GOAL #5   Title Pt  will report improved stool consistency based on [place] Stool Type ( from 1 to 3-4) in order to minimize hemorrhoids   Time 10   Period Weeks   Status New               Plan - 02/08/17 1032    Clinical Impression Statement Pt reported she had R LBP and radiating pain down her R leg that relieved  after she had diarrhea episodes across the past 3 days. Addressed pt's scoliosis today which is likely contributing to her sciatic Sx. Pt showed leg length difference and R SIJ hypomobility.  Pt reported less pain in the R SIJ area and demo'd increased hip ext ROM at SIJ post Tx. Pt's gait improved with shoe lift inserted into her L leg which was shorter than R.  Pt's shoulder height also levelled post Tx. Added lengthening and strengthening of R lumbar to account for the concave side of her lumbar curve. Suspect pt's GI system may be affected by pt's scoliotic curve and leg length difference.  However, pt plans to f/u pt's provider to understand whether pt may have medical conditions contributing to her GI Sx and if so, plan to withhold musculoskeletal approaches if needed.    Withheld intrarectal assessment today until pt has been seen by her GI provider because pt reports she has been has a lot of mucus. PT phoned referring provider, Stephens November, [place] today informing her re: this pt's complaint. Provider stated she will have a nurse f/u with pt to schedule a visit with her. Pt was informed that the clinic with call her to set up an appt.   Pt continues to benefit from skilled PT.  Plan to f/u with pt's provider to make appropriate POC.    Rehab Potential Good   PT Frequency 1x / week   PT Duration 12 weeks   PT Treatment/Interventions ADLs/Self Care Home Management;Gait training;Stair training;Moist Heat;Traction;Therapeutic activities;Therapeutic exercise;Neuromuscular re-education;Patient/family education;Manual techniques;Energy conservation;Passive range of motion;Dry needling;Taping;Balance training   Consulted and Agree with Plan of Care Patient      Patient will benefit from skilled therapeutic intervention in order to improve the following deficits and impairments:  Improper body mechanics, Pain, Postural dysfunction, Decreased strength, Decreased safety awareness, Decreased  coordination, Decreased activity tolerance, Decreased scar mobility, Decreased range of motion, Difficulty walking, Decreased mobility, Decreased endurance, Decreased balance, Increased muscle spasms, Hypomobility, Increased fascial restricitons, Impaired flexibility  Visit Diagnosis: Myalgia  Other symptoms and signs involving the musculoskeletal system  Other lack of coordination     Problem List Patient Active Problem List   Diagnosis Date Noted  . Pedal edema 12/29/2016  . BRBPR (bright red blood per rectum) 04/09/2016  . Weakness 04/09/2016  . Sleep disturbance 04/09/2016  . Bowel habit changes 01/13/2016  . Hemorrhoids, external 01/13/2016  . Status post total shoulder replacement 11/27/2015  . Health maintenance examination 10/07/2015  . Pseudogout   . Left shoulder pain 11/04/2014  . Carotid stenosis 10/18/2014  . Advanced care planning/counseling discussion 09/26/2014  . Diverticulosis of colon without hemorrhage 09/26/2014  . CKD (chronic kidney disease) stage 3, GFR 30-59 ml/min (HCC) 08/02/2014  . Restless legs 07/04/2014  . Dizziness 06/13/2014  . Lower abdominal pain 01/07/2014  . Primary osteoarthritis of both hands 11/21/2013  . Insomnia 10/23/2013  . Medicare annual wellness visit, subsequent 06/08/2013  . GERD (gastroesophageal reflux disease) 04/02/2013  . Anxiety associated with depression 12/26/2012  . Syncope due to orthostatic hypotension 04/08/2012  . Prediabetes 03/20/2012  .  Heart murmur 09/13/2011  . Syncope 08/02/2011  . HYPERTENSION, BENIGN 02/24/2007  . Hypothyroidism 11/02/2006  . HLD (hyperlipidemia) 11/02/2006  . Glaucoma 11/02/2006  . HEMORRHOIDS, INTERNAL 11/02/2006  . Chronic constipation 11/02/2006  . Irritable bowel syndrome with constipation 11/02/2006  . Osteoporosis 11/02/2006  . Osteoarthritis 10/20/2006    Jerl Mina ,PT, DPT, E-RYT  02/08/2017, 10:38 AM  [place] MAIN [facility]  SERVICES [address] [place], [place], [address] Phone: [phone]   Fax:  [phone]  Name: Brooke Beasley MRN: [id number] Date of Birth: [birth year]

## 2017-02-11 ENCOUNTER — Encounter: Payer: Self-pay | Admitting: Family Medicine

## 2017-02-11 ENCOUNTER — Ambulatory Visit (INDEPENDENT_AMBULATORY_CARE_PROVIDER_SITE_OTHER): Payer: PPO | Admitting: Family Medicine

## 2017-02-11 VITALS — BP 120/64 | HR 74 | Temp 98.0°F | Wt 168.0 lb

## 2017-02-11 DIAGNOSIS — K5909 Other constipation: Secondary | ICD-10-CM

## 2017-02-11 DIAGNOSIS — R1030 Lower abdominal pain, unspecified: Secondary | ICD-10-CM | POA: Diagnosis not present

## 2017-02-11 DIAGNOSIS — K581 Irritable bowel syndrome with constipation: Secondary | ICD-10-CM

## 2017-02-11 DIAGNOSIS — K219 Gastro-esophageal reflux disease without esophagitis: Secondary | ICD-10-CM | POA: Diagnosis not present

## 2017-02-11 MED ORDER — PANTOPRAZOLE SODIUM 40 MG PO TBEC: 40.0000 mg | DELAYED_RELEASE_TABLET | Freq: Every day | ORAL | 0 refills | Status: DC

## 2017-02-11 MED ORDER — PSYLLIUM 30.9 % PO POWD: ORAL | Status: DC

## 2017-02-11 MED ORDER — DOCUSATE SODIUM 100 MG PO CAPS: 100.0000 mg | ORAL_CAPSULE | Freq: Two times a day (BID) | ORAL | 0 refills | Status: DC

## 2017-02-11 MED ORDER — FAMOTIDINE 20 MG PO TABS: 20.0000 mg | ORAL_TABLET | Freq: Every day | ORAL | Status: DC | PRN

## 2017-02-11 NOTE — Progress Notes (Signed)
BP 120/64 (BP Location: Left Arm, Patient Position: Sitting, Cuff Size: Normal)   Pulse 74   Temp 98 F (36.7 C) (Oral)   Wt 168 lb (76.2 kg)   SpO2 96%   BMI 30.73 kg/m    CC: GI problems Subjective:    Patient ID: Brooke Beasley, female    DOB: 1930-12-08, 83 y.o.   MRN: [id number]  HPI: Brooke Beasley is a 83 y.o. female presenting on 02/11/2017 for GI Problem (Wants to be checked for bacterial infection. H/o diverticulitis. Says felt bad last week but feels better today.)   Chronic GI problems, followed by [facility]. She has IBS-C not improved with linzess. She had previous treatment for bacterial overgrowth without benefit after therapy for this. Latest referred for anal manometry and biofeedback treatment with PT. Anal manometry was normal 11/2016. She is currently undergoing PFPT with [facility] Aurea Graff.   Current bowel regimen is: 1 gas X, colace stool softener, metamucil 4 in 1.  She is not on PPI.  She stopped probiotics, colace, citrucel, linzess. Miralax worsened pedal edema and hemorrhoids so she is off this.   Today feeling some better. Here today "hard time with stomach" over weeks, ongoing gassiness, bloating, fatigue, intermittent "blow out" stools last occurred yesterday described as "some hard balls and some liquid with yellow white mucous". No blood in stool. She is having 1 stool a day. Denies fevers/chills.   She may have felt worse when she mixed metamucil with citrucel.  She endorses significant GERD, only taking pepcid once daily - this helps.   Relevant past medical, surgical, family and social history reviewed and updated as indicated. Interim medical history since our last visit reviewed. Allergies and medications reviewed and updated. Outpatient Medications Prior to Visit  Medication Sig Dispense Refill  . acetaminophen (TYLENOL) 500 MG tablet Take 500-1,000 mg by mouth at bedtime. Depends on pain    . amLODipine (NORVASC) 10 MG tablet Take 1 tablet  (10 mg total) by mouth daily. 90 tablet 1  . bimatoprost (LUMIGAN) 0.01 % SOLN Place 1 drop into both eyes at bedtime.    Marland Kitchen Bioflavonoid Products (VITAMIN C) CHEW Chew 1 tablet by mouth daily.    . cholecalciferol (VITAMIN D) 1000 units tablet Take 1,000 Units by mouth daily.    . clonazePAM (KLONOPIN) 0.5 MG tablet TAKE 1 TABLET BY MOUTH AT BEDTIME AS NEEDED FOR ANXIETY 30 tablet 3  . colchicine 0.6 MG tablet Take 1 tablet by mouth daily as needed 90 tablet 0  . fish oil-omega-3 fatty acids 1000 MG capsule Take 1 g by mouth daily.     . furosemide (LASIX) 20 MG tablet Take 1 tablet (20 mg total) by mouth daily as needed for edema. 30 tablet 3  . HYDROcodone-acetaminophen (NORCO) 5-325 MG tablet Take 1-2 tablets by mouth every 6 (six) hours as needed for moderate pain. MAXIMUM TOTAL ACETAMINOPHEN DOSE IS 4000 MG PER DAY 50 tablet 0  . levothyroxine (SYNTHROID, LEVOTHROID) 75 MCG tablet Take 1 tablet by mouth every day 90 tablet 0  . Misc Natural Products (OSTEO BI-FLEX TRIPLE STRENGTH PO) Take 1 tablet by mouth daily. With Vit D    . potassium chloride (K-DUR) 10 MEQ tablet Take 1 tablet (10 mEq total) by mouth daily as needed (with lasix). 30 tablet 3  . Pramox-PE-Glycerin-Petrolatum (PREPARATION H) 1-0.25-14.4-15 % CREA Use as directed 26 g 0  . pravastatin (PRAVACHOL) 20 MG tablet Take 1 tablet (20 mg total) by mouth  daily. 90 tablet 3  . sertraline (ZOLOFT) 100 MG tablet Take 1 tablet (100 mg total) by mouth daily. 90 tablet 3  . TURMERIC PO Take 1 tablet by mouth daily.    Marland Kitchen VITAMIN E PO Take 1 tablet by mouth daily.    . VOLTAREN 1 % GEL APPLY ONE APPLICATION TOPICALLY 3 TIMES DAILY (Patient taking differently: APPLY ONE APPLICATION TOPICALLY daily as needed for shoulder pain) 100 g 3  . Calcium Citrate (CITRACAL PO) Take 1 scoop by mouth daily.    Mariane Baumgarten Calcium (STOOL SOFTENER PO) Take 1 tablet by mouth at bedtime.    Marland Kitchen lactulose (CHRONULAC) 10 GM/15ML solution Take 10 g by mouth 2  (two) times daily.    . methylcellulose oral powder Take by mouth.    . linaclotide (LINZESS) 145 MCG CAPS capsule Take by mouth.    . Probiotic Product (PROBIOTIC DAILY PO) Take 1 capsule by mouth daily.      No facility-administered medications prior to visit.      Per HPI unless specifically indicated in ROS section below Review of Systems     Objective:    BP 120/64 (BP Location: Left Arm, Patient Position: Sitting, Cuff Size: Normal)   Pulse 74   Temp 98 F (36.7 C) (Oral)   Wt 168 lb (76.2 kg)   SpO2 96%   BMI 30.73 kg/m   Wt Readings from Last 3 Encounters:  02/11/17 168 lb (76.2 kg)  12/29/16 168 lb 4 oz (76.3 kg)  10/21/16 168 lb (76.2 kg)    Physical Exam  Constitutional: She appears well-developed and well-nourished. No distress.  HENT:  Mouth/Throat: Oropharynx is clear and moist. No oropharyngeal exudate.  Cardiovascular: Normal rate, regular rhythm, normal heart sounds and intact distal pulses.   No murmur heard. Pulmonary/Chest: Effort normal and breath sounds normal. No respiratory distress. She has no wheezes. She has no rales.  Abdominal: Soft. Normal appearance and bowel sounds are normal. She exhibits no distension and no mass. There is no hepatosplenomegaly. There is no tenderness. There is no rigidity, no rebound, no guarding and negative Murphy's sign.  Musculoskeletal: She exhibits no edema.  Psychiatric: She has a normal mood and affect.  Nursing note and vitals reviewed.  Results for orders placed or performed in visit on 22/97/98  Basic metabolic panel  Result Value Ref Range   Sodium 132 (L) 135 - 145 mEq/L   Potassium 3.4 (L) 3.5 - 5.1 mEq/L   Chloride 93 (L) 96 - 112 mEq/L   CO2 30 19 - 32 mEq/L   Glucose, Bld 105 (H) 70 - 99 mg/dL   BUN 9 6 - 23 mg/dL   Creatinine, Ser 0.92 0.40 - 1.20 mg/dL   Calcium 9.3 8.4 - 10.5 mg/dL   GFR 61.80 >60.00 mL/min   Lab Results  Component Value Date   TSH 1.97 08/03/2016       Assessment & Plan:     Problem List Items Addressed This Visit    Chronic constipation   Relevant Medications   Psyllium (METAMUCIL) 30.9 % POWD   docusate sodium (COLACE) 100 MG capsule   GERD (gastroesophageal reflux disease)    Endorses worsening GERD and only treating with PRN pepcid. I advised she start pantoprazole 40mg  daily - Rx sent to pharmacy.       Relevant Medications   Psyllium (METAMUCIL) 30.9 % POWD   docusate sodium (COLACE) 100 MG capsule   famotidine (PEPCID) 20 MG tablet  pantoprazole (PROTONIX) 40 MG tablet   Irritable bowel syndrome with constipation - Primary    Chronic, pretty severe symptoms. Today feels better. I don't see signs of bacterial infection (colitis or H pylori).  Reviewed importance of regular bowel regimen (hers will be colace 100 mg BID, metamucil once daily, gasX PRN), recommended lactose-free trial x 1 week, recommended avoiding gas producing foods. Update with effect.  I did encourage GI f/u for her chronic complicated IBS-C symptoms.       Relevant Medications   Psyllium (METAMUCIL) 30.9 % POWD   docusate sodium (COLACE) 100 MG capsule   famotidine (PEPCID) 20 MG tablet   pantoprazole (PROTONIX) 40 MG tablet   Lower abdominal pain       Follow up plan: Return if symptoms worsen or fail to improve.  [name], MD

## 2017-02-11 NOTE — Patient Instructions (Addendum)
Start protonix 40mg  daily for 3 weeks then as needed.  Your daily bowel regimen - colace twice daily, metamucil 1 capful daily.  Increase water.  Exclude gas producing foods (beans, onions, celery, carrots, raisins, bananas, apricots, prunes, brussel sprouts, wheat germ, pretzels) Consider trail of lactose free diet for 1 week (back off milk and diary products) Call and schedule follow up with GI. Ask them to go over CT virtul colonoscopy.

## 2017-02-12 NOTE — Addendum Note (Signed)
Addended by: [name] on: 02/12/2017 12:51 PM   Modules accepted: Level of Service

## 2017-02-12 NOTE — Assessment & Plan Note (Signed)
Endorses worsening GERD and only treating with PRN pepcid. I advised she start pantoprazole 40mg  daily - Rx sent to pharmacy.

## 2017-02-12 NOTE — Assessment & Plan Note (Signed)
Chronic, pretty severe symptoms. Today feels better. I don't see signs of bacterial infection (colitis or H pylori).  Reviewed importance of regular bowel regimen (hers will be colace 100 mg BID, metamucil once daily, gasX PRN), recommended lactose-free trial x 1 week, recommended avoiding gas producing foods. Update with effect.  I did encourage GI f/u for her chronic complicated IBS-C symptoms.

## 2017-02-14 ENCOUNTER — Ambulatory Visit: Payer: PPO | Admitting: Physical Therapy

## 2017-02-14 DIAGNOSIS — R278 Other lack of coordination: Secondary | ICD-10-CM

## 2017-02-14 DIAGNOSIS — R29898 Other symptoms and signs involving the musculoskeletal system: Secondary | ICD-10-CM

## 2017-02-14 DIAGNOSIS — M791 Myalgia, unspecified site: Secondary | ICD-10-CM

## 2017-02-14 NOTE — Therapy (Addendum)
[place] MAIN [facility] SERVICES [address] [place], [place], [address] Phone: [phone]   Fax:  [phone]  Physical Therapy Treatment  Patient Details  Name: Brooke Beasley MRN: [id number] Date of Birth: [birth year] Referring Provider: Stephens November  Encounter Date: 02/14/2017      PT End of Session - 02/14/17 1142    Visit Number 3   Number of Visits 12   Authorization Type g code    PT Start Time 1113   PT Stop Time 1200   PT Time Calculation (min) 47 min      Past Medical History:  Diagnosis Date  . Anxiety   . Carotid stenosis 10/18/2014   R 1-39%, L 40-59%, rpt 1 yr (09/2014)   . CKD (chronic kidney disease) stage 3, GFR 30-59 ml/min (HCC) 08/02/2014  . Degenerative disc disease    LS  . Depression    nervous breakdonw in 1964-out of work for a year  . Diverticulosis    severe by colonoscopy  . Family history of adverse reaction to anesthesia    n/v  . GERD (gastroesophageal reflux disease)   . Glaucoma   . Heart murmur 5/13   mitral regurge - on echo   . History of hiatal hernia   . History of shingles   . HTN (hypertension)   . Hyperlipidemia   . Hypertension   . Hypothyroidism   . IBS 11/02/2006  . Intestinal bacterial overgrowth    In small colon  . Maxillary fracture ([place]) 04/08/2012  . Osteoarthritis 2016   Jefm Bryant)  . Osteoporosis    dexa 2011  . Pseudogout 2016   shoulders ([place]gi)    Past Surgical History:  Procedure Laterality Date  . barium enema  2012   severe diverticulosis, redundant colon  . Bowel obstruction  1999   no surgery in hosp x 3 days  . COLONOSCOPY  1. 1999  2. 11/04   1. Not finished  2. Slight hemorrhage rectosigmoid area, severe sig diverticulosis  . Dexa  1. 3154-0086   2. 9/04  3. 3/08    1. OP  2. OP, borderline, spine -2.44T  3. decreased BMD-OP  . DEXA  2011   OP in spine  . DG KNEE 1-2 VIEWS BILAT     LS x-ray with degenerative disc and facet change  .  ESOPHAGOGASTRODUODENOSCOPY     Negative  . Hemorrhoid procedure  4/08  . JOINT REPLACEMENT    . TONSILLECTOMY    . TOTAL SHOULDER ARTHROPLASTY Left 11/27/2015   Corky Mull, MD  . TOTAL SHOULDER REVISION Left 03/16/2016   Procedure: TOTAL SHOULDER REVISION;  Surgeon: Corky Mull, MD;  Location: [facility] ORS;  Service: Orthopedics;  Laterality: Left;  . US ECHOCARDIOGRAPHY  10/6193   Normal systolic fxn with EF 09-32%.  Focal basal septal hypertrophy.  Mild diastolic dysfunction.  Mild MR.    There were no vitals filed for this visit.      Subjective Assessment - 02/14/17 1119    Subjective Pt reported she is feeling better for the past 3 days.  Pt saw her PCP 3 days ago and was put on acid reflex medicaiton but she has not taken it yet but plan to today. Pt 's PCP referred her back to her GI specialist for her concern about excessive anal muscus. Pt has an appt with her GI doctor on 04/12/17.  Her back has been sore but nothing like it was at  the last session.    Pertinent History 2 vaginal deliveries with episiotomy. "When I had my first one, they sewed me up. I had an infection" .  Hobbies: work in the yard, pull weeds. Pt has not felt like doing this. Pt 's husband passed away 1 year ago. Dtr and her husband will be moving in with her soon.     Patient Stated Goals "I do not get around much because of this"  " I would like to feeling better, like a human being. I feel like I am dragging and full of poo all the time."              [facility] PT Assessment - 02/14/17 1124      Palpation   Spinal mobility minor tightness along R QL    SI assessment  R SIJ with normal mobility                  Pelvic Floor Special Questions - 02/14/17 1125    Diastasis Recti 3 fingers below umbilicus ( post Tx: no separation)       OUT of bed technique: abdominal straining with head crunch. Able to demo log rolling with minor cues      [facility] Adult PT Treatment/Exercise - 02/14/17 1138       Neuro Re-ed    Neuro Re-ed Details  see pt instructions      Manual Therapy   Internal Pelvic Floor plantigrade position: abdominal pulling with trunk rotation/ flexion for diastasis recti,  MWM with STM with R open shoulder                   PT Education - 02/14/17 1142    Education provided Yes   Education Details HEP   Person(s) Educated Patient   Methods Explanation;Demonstration;Tactile cues;Verbal cues;Handout   Comprehension Returned demonstration;Verbalized understanding             PT Long Term Goals - 02/14/17 1149      PT LONG TERM GOAL #1   Title Pt will report increased "good days" per month from 7 days to > 14 days in order to improve QOL   Time 12   Period Weeks   Status On-going     PT LONG TERM GOAL #2   Title Pt will demo decreased score on COREFO  from 32 % to < 27 % in order to restore bowel function   Time 12   Period Weeks   Status On-going     PT LONG TERM GOAL #3   Title Pt will demo decreased perineal scar restrictions in order to lengthen pelvic floor mm to eliminate completely   Time 4   Period Weeks   Status On-going     PT LONG TERM GOAL #4   Title Pt will demo proper coordination of pelvic floor without abdominal straining in order to minimize pelvic floor dysfunction and improve QOL   Time 8   Period Weeks   Status Achieved     PT LONG TERM GOAL #5   Title Pt will report improved stool consistency based on Bristol Stool Type ( from 1 to 3-4) in order to minimize hemorrhoids   Time 10   Period Weeks   Status On-going               Plan - 02/14/17 1145    Clinical Impression Statement Pt showed brighter affect today as pt reported she is feeling better. Her back is  less sore and she has been keeping up with her scoliosis-specific HEP. Pt showed improved R sacroiliac mobility and symmetry as she continues to wear the shoe lift she was provided at last session to address her leg length difference.Manual Tx addressed  her diastasis recti below her umbilicus which showed less spearation post Tx. Pt was initiated on deep core strengthening as pt demo'd improve coordination of breathing and pelvic floor and abdominal mm. Manual Tx also addressed her R low back mm which she reported to be less sore after Tx.  Educated pt to perform log rolling technique to get out of bed to minimize strain on abdominal mm and to minimize worsening of diastasis recti. Pt demo'd correctly post Tx.   Suspect pt's poor intraabdominal pressure system related to diastasis recti and scoliosis are contributing factors to her GI Sx.  Deep core strengthening and scoliosis specific HEP will help relieve her back compliants and GI Sx.   Pt continues to benefit from skilled PT.    Rehab Potential Good   PT Frequency 1x / week   PT Duration 12 weeks   PT Treatment/Interventions ADLs/Self Care Home Management;Gait training;Stair training;Moist Heat;Traction;Therapeutic activities;Therapeutic exercise;Neuromuscular re-education;Patient/family education;Manual techniques;Energy conservation;Passive range of motion;Dry needling;Taping;Balance training   Consulted and Agree with Plan of Care Patient      Patient will benefit from skilled therapeutic intervention in order to improve the following deficits and impairments:  Improper body mechanics, Pain, Postural dysfunction, Decreased strength, Decreased safety awareness, Decreased coordination, Decreased activity tolerance, Decreased scar mobility, Decreased range of motion, Difficulty walking, Decreased mobility, Decreased endurance, Decreased balance, Increased muscle spasms, Hypomobility, Increased fascial restricitons, Impaired flexibility  Visit Diagnosis: Myalgia  Other symptoms and signs involving the musculoskeletal system  Other lack of coordination     Problem List Patient Active Problem List   Diagnosis Date Noted  . Pedal edema 12/29/2016  . BRBPR (bright red blood per rectum)  04/09/2016  . Weakness 04/09/2016  . Sleep disturbance 04/09/2016  . Bowel habit changes 01/13/2016  . Hemorrhoids, external 01/13/2016  . Status post total shoulder replacement 11/27/2015  . Health maintenance examination 10/07/2015  . Pseudogout   . Left shoulder pain 11/04/2014  . Carotid stenosis 10/18/2014  . Advanced care planning/counseling discussion 09/26/2014  . Diverticulosis of colon without hemorrhage 09/26/2014  . CKD (chronic kidney disease) stage 3, GFR 30-59 ml/min (HCC) 08/02/2014  . Restless legs 07/04/2014  . Dizziness 06/13/2014  . Lower abdominal pain 01/07/2014  . Primary osteoarthritis of both hands 11/21/2013  . Insomnia 10/23/2013  . Medicare annual wellness visit, subsequent 06/08/2013  . GERD (gastroesophageal reflux disease) 04/02/2013  . Anxiety associated with depression 12/26/2012  . Syncope due to orthostatic hypotension 04/08/2012  . Prediabetes 03/20/2012  . Heart murmur 09/13/2011  . Syncope 08/02/2011  . HYPERTENSION, BENIGN 02/24/2007  . Hypothyroidism 11/02/2006  . HLD (hyperlipidemia) 11/02/2006  . Glaucoma 11/02/2006  . HEMORRHOIDS, INTERNAL 11/02/2006  . Chronic constipation 11/02/2006  . Irritable bowel syndrome with constipation 11/02/2006  . Osteoporosis 11/02/2006  . Osteoarthritis 10/20/2006    Jerl Mina ,PT, DPT, E-RYT  02/14/2017, 1:55 PM  [place] MAIN [facility] SERVICES [address] [place], [place], [address] Phone: [phone]   Fax:  [phone]  Name: SHANTOYA GEURTS MRN: [id number] Date of Birth: 08-28-1930

## 2017-02-14 NOTE — Patient Instructions (Addendum)
Deep core level 2  ( handout)     Avoid straining pelvic floor, abdominal muscles , spine  Use log rolling technique instead of getting out of bed with your neck or the sit-up   Log rolling out of .bed  L  arm overhead  Raise hips and scoot hips to R   Drop knees to L,  scooting L shoulder back to get completely on your L side so your shoulders, hips, and knees point to the L    Then breathe as you drop feet off bed and prop onto L elbow and  use both hands to push yourself

## 2017-02-21 ENCOUNTER — Ambulatory Visit: Payer: PPO | Admitting: Physical Therapy

## 2017-02-21 DIAGNOSIS — R29898 Other symptoms and signs involving the musculoskeletal system: Secondary | ICD-10-CM

## 2017-02-21 DIAGNOSIS — M791 Myalgia, unspecified site: Secondary | ICD-10-CM

## 2017-02-21 DIAGNOSIS — R278 Other lack of coordination: Secondary | ICD-10-CM

## 2017-02-21 NOTE — Therapy (Signed)
[place] MAIN [facility] SERVICES [address] [place], [place], [address] Phone: [phone]   Fax:  [phone]  Physical Therapy Treatment  Patient Details  Name: Brooke Beasley MRN: [id number] Date of Birth: 02/10/1931 Referring Provider: Stephens November  Encounter Date: 02/21/2017      PT End of Session - 02/21/17 1147    Visit Number 4   Number of Visits 12   Authorization Type g code    PT Start Time 1100   PT Stop Time 1155   PT Time Calculation (min) 55 min   Activity Tolerance Patient tolerated treatment well;No increased pain   Behavior During Therapy WFL for tasks assessed/performed      Past Medical History:  Diagnosis Date  . Anxiety   . Carotid stenosis 10/18/2014   R 1-39%, L 40-59%, rpt 1 yr (09/2014)   . CKD (chronic kidney disease) stage 3, GFR 30-59 ml/min (HCC) 08/02/2014  . Degenerative disc disease    LS  . Depression    nervous breakdonw in 1964-out of work for a year  . Diverticulosis    severe by colonoscopy  . Family history of adverse reaction to anesthesia    n/v  . GERD (gastroesophageal reflux disease)   . Glaucoma   . Heart murmur 5/13   mitral regurge - on echo   . History of hiatal hernia   . History of shingles   . HTN (hypertension)   . Hyperlipidemia   . Hypertension   . Hypothyroidism   . IBS 11/02/2006  . Intestinal bacterial overgrowth    In small colon  . Maxillary fracture ([place]) 04/08/2012  . Osteoarthritis 2016   Jefm Bryant)  . Osteoporosis    dexa 2011  . Pseudogout 2016   shoulders ([place])    Past Surgical History:  Procedure Laterality Date  . barium enema  2012   severe diverticulosis, redundant colon  . Bowel obstruction  1999   no surgery in hosp x 3 days  . COLONOSCOPY  1. 1999  2. 11/04   1. Not finished  2. Slight hemorrhage rectosigmoid area, severe sig diverticulosis  . Dexa  1. 8756-4332   2. 9/04  3. 3/08    1. OP  2. OP, borderline, spine -2.44T  3.  decreased BMD-OP  . DEXA  2011   OP in spine  . DG KNEE 1-2 VIEWS BILAT     LS x-ray with degenerative disc and facet change  . ESOPHAGOGASTRODUODENOSCOPY     Negative  . Hemorrhoid procedure  4/08  . JOINT REPLACEMENT    . TONSILLECTOMY    . TOTAL SHOULDER ARTHROPLASTY Left 11/27/2015   Corky Mull, MD  . TOTAL SHOULDER REVISION Left 03/16/2016   Procedure: TOTAL SHOULDER REVISION;  Surgeon: Corky Mull, MD;  Location: [facility] ORS;  Service: Orthopedics;  Laterality: Left;  . US ECHOCARDIOGRAPHY  12/5186   Normal systolic fxn with EF 41-66%.  Focal basal septal hypertrophy.  Mild diastolic dysfunction.  Mild MR.    There were no vitals filed for this visit.      Subjective Assessment - 02/21/17 1111    Subjective Pt reported has had a week of bowel movements with small balls and pt had to useher finger one time this week. Typcially, she has to use her finger once to twice a week.  Pt also report hemorrhoids have been bad.    Pertinent History 2 vaginal deliveries with episiotomy. "When I had my first  one, they sewed me up. I had an infection" .  Hobbies: work in the yard, pull weeds. Pt has not felt like doing this. Pt 's husband passed away 1 year ago. Dtr and her husband will be moving in with her soon.     Patient Stated Goals "I do not get around much because of this"  " I would like to feeling better, like a human being. I feel like I am dragging and full of poo all the time."                        Pelvic Floor Special Questions - 02/21/17 0001    Diastasis Recti no widths width   Pelvic Floor Internal Exam pt consented verbally without contraindications after PT explained details to exam    Exam Type Vaginal   Palpation restricted scar at 4-6 o clock on L ( decreased post Tx with increased ROM)            [facility] Adult PT Treatment/Exercise - 02/21/17 1142      Neuro Re-ed    Neuro Re-ed Details  see pt instructions      Manual Therapy   Internal Pelvic  Floor scar releases 4-6 o clock. thiele massage                       PT Long Term Goals - 02/14/17 1149      PT LONG TERM GOAL #1   Title Pt will report increased "good days" per month from 7 days to > 14 days in order to improve QOL   Time 12   Period Weeks   Status On-going     PT LONG TERM GOAL #2   Title Pt will demo decreased score on COREFO  from 32 % to < 27 % in order to restore bowel function   Time 12   Period Weeks   Status On-going     PT LONG TERM GOAL #3   Title Pt will demo decreased perineal scar restrictions in order to lengthen pelvic floor mm to eliminate completely   Time 4   Period Weeks   Status On-going     PT LONG TERM GOAL #4   Title Pt will demo proper coordination of pelvic floor without abdominal straining in order to minimize pelvic floor dysfunction and improve QOL   Time 8   Period Weeks   Status Achieved     PT LONG TERM GOAL #5   Title Pt will report improved stool consistency based on Bristol Stool Type ( from 1 to 3-4) in order to minimize hemorrhoids   Time 10   Period Weeks   Status On-going               Plan - 02/21/17 1147    Clinical Impression Statement Pt demo'd decreased perineal scar and achieved increased pelvic floor ROM. Pt was able to catch herself with paradoxical coordination of pelvic floor and self-correct.  Pt continues to show resolved diastasis recti which will continue to improve intraabdominal pressure system for motility.Initiated motility exercises with lower trunk rotation and knee to chest.  Pt continues to benefit from skilled PT    Rehab Potential Good   PT Frequency 1x / week   PT Duration 12 weeks   PT Treatment/Interventions ADLs/Self Care Home Management;Gait training;Stair training;Moist Heat;Traction;Therapeutic activities;Therapeutic exercise;Neuromuscular re-education;Patient/family education;Manual techniques;Energy conservation;Passive range of motion;Dry needling;Taping;Balance  training   Consulted and  Agree with Plan of Care Patient      Patient will benefit from skilled therapeutic intervention in order to improve the following deficits and impairments:  Improper body mechanics, Pain, Postural dysfunction, Decreased strength, Decreased safety awareness, Decreased coordination, Decreased activity tolerance, Decreased scar mobility, Decreased range of motion, Difficulty walking, Decreased mobility, Decreased endurance, Decreased balance, Increased muscle spasms, Hypomobility, Increased fascial restricitons, Impaired flexibility  Visit Diagnosis: Myalgia  Other symptoms and signs involving the musculoskeletal system  Other lack of coordination     Problem List Patient Active Problem List   Diagnosis Date Noted  . Pedal edema 12/29/2016  . BRBPR (bright red blood per rectum) 04/09/2016  . Weakness 04/09/2016  . Sleep disturbance 04/09/2016  . Bowel habit changes 01/13/2016  . Hemorrhoids, external 01/13/2016  . Status post total shoulder replacement 11/27/2015  . Health maintenance examination 10/07/2015  . Pseudogout   . Left shoulder pain 11/04/2014  . Carotid stenosis 10/18/2014  . Advanced care planning/counseling discussion 09/26/2014  . Diverticulosis of colon without hemorrhage 09/26/2014  . CKD (chronic kidney disease) stage 3, GFR 30-59 ml/min (HCC) 08/02/2014  . Restless legs 07/04/2014  . Dizziness 06/13/2014  . Lower abdominal pain 01/07/2014  . Primary osteoarthritis of both hands 11/21/2013  . Insomnia 10/23/2013  . Medicare annual wellness visit, subsequent 06/08/2013  . GERD (gastroesophageal reflux disease) 04/02/2013  . Anxiety associated with depression 12/26/2012  . Syncope due to orthostatic hypotension 04/08/2012  . Prediabetes 03/20/2012  . Heart murmur 09/13/2011  . Syncope 08/02/2011  . HYPERTENSION, BENIGN 02/24/2007  . Hypothyroidism 11/02/2006  . HLD (hyperlipidemia) 11/02/2006  . Glaucoma 11/02/2006  .  HEMORRHOIDS, INTERNAL 11/02/2006  . Chronic constipation 11/02/2006  . Irritable bowel syndrome with constipation 11/02/2006  . Osteoporosis 11/02/2006  . Osteoarthritis 10/20/2006    Jerl Mina ,PT, DPT, E-RYT  02/21/2017, 11:58 AM  [place] MAIN [facility] SERVICES [address] [place], [place], [address] Phone: [phone]   Fax:  [phone]  Name: Brooke Beasley MRN: [id number] Date of Birth: May 20, 1930

## 2017-02-21 NOTE — Patient Instructions (Signed)
Deep core exercises   Knee to chest stretch laying down  Seated marching with opposite arm to knee for twists and motility  2 min

## 2017-02-24 DIAGNOSIS — H401131 Primary open-angle glaucoma, bilateral, mild stage: Secondary | ICD-10-CM | POA: Diagnosis not present

## 2017-02-24 DIAGNOSIS — H524 Presbyopia: Secondary | ICD-10-CM | POA: Diagnosis not present

## 2017-02-28 ENCOUNTER — Ambulatory Visit: Payer: PPO | Attending: Gastroenterology | Admitting: Physical Therapy

## 2017-02-28 DIAGNOSIS — R29898 Other symptoms and signs involving the musculoskeletal system: Secondary | ICD-10-CM | POA: Diagnosis not present

## 2017-02-28 DIAGNOSIS — R278 Other lack of coordination: Secondary | ICD-10-CM | POA: Insufficient documentation

## 2017-02-28 DIAGNOSIS — M791 Myalgia, unspecified site: Secondary | ICD-10-CM

## 2017-02-28 NOTE — Patient Instructions (Signed)
Body scan technique  3 reps

## 2017-02-28 NOTE — Therapy (Signed)
[place] MAIN [facility] SERVICES [address] [place], [place], [address] Phone: [phone]   Fax:  [phone]  Physical Therapy Treatment  Patient Details  Name: Brooke Beasley MRN: [id number] Date of Birth: 30-May-1930 Referring Provider: Stephens November   Encounter Date: 02/28/2017  PT End of Session - 02/28/17 1316    Visit Number  5    Number of Visits  12    Authorization Type  g code     PT Start Time  1104    PT Stop Time  1200    PT Time Calculation (min)  56 min    Activity Tolerance  Patient tolerated treatment well;No increased pain    Behavior During Therapy  WFL for tasks assessed/performed       Past Medical History:  Diagnosis Date  . Anxiety   . Carotid stenosis 10/18/2014   R 1-39%, L 40-59%, rpt 1 yr (09/2014)   . CKD (chronic kidney disease) stage 3, GFR 30-59 ml/min (HCC) 08/02/2014  . Degenerative disc disease    LS  . Depression    nervous breakdonw in 1964-out of work for a year  . Diverticulosis    severe by colonoscopy  . Family history of adverse reaction to anesthesia    n/v  . GERD (gastroesophageal reflux disease)   . Glaucoma   . Heart murmur 5/13   mitral regurge - on echo   . History of hiatal hernia   . History of shingles   . HTN (hypertension)   . Hyperlipidemia   . Hypertension   . Hypothyroidism   . IBS 11/02/2006  . Intestinal bacterial overgrowth    In small colon  . Maxillary fracture ([place]) 04/08/2012  . Osteoarthritis 2016   Jefm Bryant)  . Osteoporosis    dexa 2011  . Pseudogout 2016   shoulders ([place])    Past Surgical History:  Procedure Laterality Date  . barium enema  2012   severe diverticulosis, redundant colon  . Bowel obstruction  1999   no surgery in hosp x 3 days  . COLONOSCOPY  1. 1999  2. 11/04   1. Not finished  2. Slight hemorrhage rectosigmoid area, severe sig diverticulosis  . Dexa  1. 0109-3235   2. 9/04  3. 3/08    1. OP  2. OP, borderline, spine -2.44T   3. decreased BMD-OP  . DEXA  2011   OP in spine  . DG KNEE 1-2 VIEWS BILAT     LS x-ray with degenerative disc and facet change  . ESOPHAGOGASTRODUODENOSCOPY     Negative  . Hemorrhoid procedure  4/08  . JOINT REPLACEMENT    . TONSILLECTOMY    . US ECHOCARDIOGRAPHY  08/7320   Normal systolic fxn with EF 02-54%.  Focal basal septal hypertrophy.  Mild diastolic dysfunction.  Mild MR.    There were no vitals filed for this visit.  Subjective Assessment - 02/28/17 1110    Subjective  Pt felt a little better last week until yesterday. Pt had anaccident when she was outside and she also had to use her finger to get her bowels out.     Pertinent History  2 vaginal deliveries with episiotomy. "When I had my first one, they sewed me up. I had an infection" .  Hobbies: work in the yard, pull weeds. Pt has not felt like doing this. Pt 's husband passed away 1 year ago. Dtr and her husband will be moving in with her  soon.      Patient Stated Goals  "I do not get around much because of this"  " I would like to feeling better, like a human being. I feel like I am dragging and full of poo all the time."           [facility] PT Assessment - 02/28/17 1314      Coordination   Gross Motor Movements are Fluid and Coordinated  -- moderate cues for paradoxical coordination of pelvic floor    moderate cues for paradoxical coordination of pelvic floor               Pelvic Floor Special Questions - 02/28/17 1313    Diastasis Recti  no widths width    Pelvic Floor Internal Exam  pt consented verbally without contraindications after PT explained details to exam     Exam Type  Vaginal    Palpation  restricted scar at 5-7 o clock on B ( decreased post Tx with increased ROM)     Strength # of reps  3 moderate cues for not contracting with inhalation   moderate cues for not contracting with inhalation   Strength # of seconds  1                PT Education - 02/28/17 1316    Education provided   Yes    Education Details  HEP    Person(s) Educated  Patient    Methods  Explanation;Demonstration;Verbal cues;Tactile cues    Comprehension  Verbalized understanding;Returned demonstration;Verbal cues required;Tactile cues required          PT Long Term Goals - 02/14/17 1149      PT LONG TERM GOAL #1   Title  Pt will report increased "good days" per month from 7 days to > 14 days in order to improve QOL    Time  12    Period  Weeks    Status  On-going      PT LONG TERM GOAL #2   Title  Pt will demo decreased score on COREFO  from 32 % to < 27 % in order to restore bowel function    Time  12    Period  Weeks    Status  On-going      PT LONG TERM GOAL #3   Title  Pt will demo decreased perineal scar restrictions in order to lengthen pelvic floor mm to eliminate completely    Time  4    Period  Weeks    Status  On-going      PT LONG TERM GOAL #4   Title  Pt will demo proper coordination of pelvic floor without abdominal straining in order to minimize pelvic floor dysfunction and improve QOL    Time  8    Period  Weeks    Status  Achieved      PT LONG TERM GOAL #5   Title  Pt will report improved stool consistency based on Bristol Stool Type ( from 1 to 3-4) in order to minimize hemorrhoids    Time  10    Period  Weeks    Status  On-going            Plan - 02/28/17 1617    Clinical Impression Statement  Pt's perineal scar restrictions decreased further with today's intravaginal manual Tx. Pt was able to demo increased pelvic floor lengthening following Tx. Initiated relaxation training to promote stress relief and parasympathetic activation for  optimal GI function. Pt reported she had felt better after last session with her Sx and she remains compliant with HEP. Today, pt reported feeling relaxed after the Tx Pt continues to benefit from skilled PT.     Rehab Potential  Good    PT Frequency  1x / week    PT Duration  12 weeks    PT Treatment/Interventions   ADLs/Self Care Home Management;Gait training;Stair training;Moist Heat;Traction;Therapeutic activities;Therapeutic exercise;Neuromuscular re-education;Patient/family education;Manual techniques;Energy conservation;Passive range of motion;Dry needling;Taping;Balance training    Consulted and Agree with Plan of Care  Patient       Patient will benefit from skilled therapeutic intervention in order to improve the following deficits and impairments:  Improper body mechanics, Pain, Postural dysfunction, Decreased strength, Decreased safety awareness, Decreased coordination, Decreased activity tolerance, Decreased scar mobility, Decreased range of motion, Difficulty walking, Decreased mobility, Decreased endurance, Decreased balance, Increased muscle spasms, Hypomobility, Increased fascial restricitons, Impaired flexibility  Visit Diagnosis: Myalgia  Other symptoms and signs involving the musculoskeletal system  Other lack of coordination     Problem List Patient Active Problem List   Diagnosis Date Noted  . Pedal edema 12/29/2016  . BRBPR (bright red blood per rectum) 04/09/2016  . Weakness 04/09/2016  . Sleep disturbance 04/09/2016  . Bowel habit changes 01/13/2016  . Hemorrhoids, external 01/13/2016  . Status post total shoulder replacement 11/27/2015  . Health maintenance examination 10/07/2015  . Pseudogout   . Left shoulder pain 11/04/2014  . Carotid stenosis 10/18/2014  . Advanced care planning/counseling discussion 09/26/2014  . Diverticulosis of colon without hemorrhage 09/26/2014  . CKD (chronic kidney disease) stage 3, GFR 30-59 ml/min (HCC) 08/02/2014  . Restless legs 07/04/2014  . Dizziness 06/13/2014  . Lower abdominal pain 01/07/2014  . Primary osteoarthritis of both hands 11/21/2013  . Insomnia 10/23/2013  . Medicare annual wellness visit, subsequent 06/08/2013  . GERD (gastroesophageal reflux disease) 04/02/2013  . Anxiety associated with depression 12/26/2012  .  Syncope due to orthostatic hypotension 04/08/2012  . Prediabetes 03/20/2012  . Heart murmur 09/13/2011  . Syncope 08/02/2011  . HYPERTENSION, BENIGN 02/24/2007  . Hypothyroidism 11/02/2006  . HLD (hyperlipidemia) 11/02/2006  . Glaucoma 11/02/2006  . HEMORRHOIDS, INTERNAL 11/02/2006  . Chronic constipation 11/02/2006  . Irritable bowel syndrome with constipation 11/02/2006  . Osteoporosis 11/02/2006  . Osteoarthritis 10/20/2006    Jerl Mina ,PT, DPT, E-RYT  02/28/2017, 4:24 PM  [place] MAIN [facility] SERVICES [address] [place], [place], [address] Phone: [phone]   Fax:  [phone]  Name: Brooke Beasley MRN: [id number] Date of Birth: 04-Sep-1930

## 2017-03-08 ENCOUNTER — Other Ambulatory Visit: Payer: Self-pay | Admitting: Family Medicine

## 2017-03-08 NOTE — Telephone Encounter (Signed)
Last filled:  02/06/17, #30 Last OV:  02/11/17 Next OV:  04/15/17

## 2017-03-08 NOTE — Telephone Encounter (Signed)
plz phone in. 

## 2017-03-09 NOTE — Telephone Encounter (Signed)
Left refill on voice mail at pharmacy  

## 2017-04-12 ENCOUNTER — Ambulatory Visit: Payer: PPO | Attending: Gastroenterology | Admitting: Physical Therapy

## 2017-04-12 DIAGNOSIS — R278 Other lack of coordination: Secondary | ICD-10-CM | POA: Insufficient documentation

## 2017-04-12 DIAGNOSIS — R29898 Other symptoms and signs involving the musculoskeletal system: Secondary | ICD-10-CM | POA: Diagnosis not present

## 2017-04-12 DIAGNOSIS — K5909 Other constipation: Secondary | ICD-10-CM | POA: Diagnosis not present

## 2017-04-12 DIAGNOSIS — M791 Myalgia, unspecified site: Secondary | ICD-10-CM

## 2017-04-12 NOTE — Patient Instructions (Signed)
Make sure to not tighten pelvic floor when inhaling Allow ribcage to expand to let pelvic floor expand instead of breathing into the chest    Practice this coordination with bowel movements  _______ Complete food diary and return at next visit    _______  6 min walking in the house x 2 day

## 2017-04-13 DIAGNOSIS — R197 Diarrhea, unspecified: Secondary | ICD-10-CM | POA: Diagnosis not present

## 2017-04-13 NOTE — Therapy (Signed)
[place] MAIN [facility] SERVICES [address] [place], [place], [address] Phone: [phone]   Fax:  [phone]  Physical Therapy Treatment / Progress Note  Patient Details  Name: Brooke Beasley MRN: [id number] Date of Birth: Oct 01, 1930 Referring Provider: Stephens November   Encounter Date: 04/12/2017  PT End of Session - 04/13/17 0907    Visit Number  6    Number of Visits  12    Authorization Type  g code     PT Start Time  3710    PT Stop Time  1345    PT Time Calculation (min)  40 min    Activity Tolerance  Patient tolerated treatment well;No increased pain    Behavior During Therapy  WFL for tasks assessed/performed       Past Medical History:  Diagnosis Date  . Anxiety   . Carotid stenosis 10/18/2014   R 1-39%, L 40-59%, rpt 1 yr (09/2014)   . CKD (chronic kidney disease) stage 3, GFR 30-59 ml/min (HCC) 08/02/2014  . Degenerative disc disease    LS  . Depression    nervous breakdonw in 1964-out of work for a year  . Diverticulosis    severe by colonoscopy  . Family history of adverse reaction to anesthesia    n/v  . GERD (gastroesophageal reflux disease)   . Glaucoma   . Heart murmur 5/13   mitral regurge - on echo   . History of hiatal hernia   . History of shingles   . HTN (hypertension)   . Hyperlipidemia   . Hypertension   . Hypothyroidism   . IBS 11/02/2006  . Intestinal bacterial overgrowth    In small colon  . Maxillary fracture ([place]) 04/08/2012  . Osteoarthritis 2016   Jefm Bryant)  . Osteoporosis    dexa 2011  . Pseudogout 2016   shoulders ([place])    Past Surgical History:  Procedure Laterality Date  . barium enema  2012   severe diverticulosis, redundant colon  . Bowel obstruction  1999   no surgery in hosp x 3 days  . COLONOSCOPY  1. 1999  2. 11/04   1. Not finished  2. Slight hemorrhage rectosigmoid area, severe sig diverticulosis  . Dexa  1. 6269-4854   2. 9/04  3. 3/08    1. OP  2. OP,  borderline, spine -2.44T  3. decreased BMD-OP  . DEXA  2011   OP in spine  . DG KNEE 1-2 VIEWS BILAT     LS x-ray with degenerative disc and facet change  . ESOPHAGOGASTRODUODENOSCOPY     Negative  . Hemorrhoid procedure  4/08  . JOINT REPLACEMENT    . TONSILLECTOMY    . TOTAL SHOULDER ARTHROPLASTY Left 11/27/2015   Corky Mull, MD  . TOTAL SHOULDER REVISION Left 03/16/2016   Procedure: TOTAL SHOULDER REVISION;  Surgeon: Corky Mull, MD;  Location: [facility] ORS;  Service: Orthopedics;  Laterality: Left;  . US ECHOCARDIOGRAPHY  09/2701   Normal systolic fxn with EF 50-09%.  Focal basal septal hypertrophy.  Mild diastolic dysfunction.  Mild MR.    There were no vitals filed for this visit.  Subjective Assessment - 04/12/17 1308    Subjective  Pt reports her bowels are "not moving as good". Pt states her stools are  "still like little balls and harder than before".     Pertinent History  2 vaginal deliveries with episiotomy. "When I had my first one, they sewed me  up. I had an infection" .  Hobbies: work in the yard, pull weeds. Pt has not felt like doing this. Pt 's husband passed away 1 year ago. Dtr and her husband will be moving in with her soon.      Patient Stated Goals  "I do not get around much because of this"  " I would like to feeling better, like a human being. I feel like I am dragging and full of poo all the time."           [facility] PT Assessment - 04/13/17 0907      Observation/Other Assessments   Observations  shoulder height more equal       Coordination   Gross Motor Movements are Fluid and Coordinated  -- dyssynergia of pelvic floor mm      Palpation   Spinal mobility  no tightness at R QL    SI assessment   ASIS aligned                  [facility] Adult PT Treatment/Exercise - 04/13/17 0907      Ambulation/Gait   Gait Comments  shoe lift in place      Neuro Re-ed    Neuro Re-ed Details   see pt instructions       Manual Therapy   Internal Pelvic Floor   intrrectal: 6 o clock puborectalis release                  PT Long Term Goals - 04/12/17 1309      PT LONG TERM GOAL #1   Title  Pt will report increased "good days" per month from 7 days to > 14 days in order to improve QOL    Time  12    Period  Weeks    Status  On-going      PT LONG TERM GOAL #2   Title  Pt will demo decreased score on COREFO  from 32 % to < 27 % in order to restore bowel function    Time  12    Period  Weeks    Status  On-going      PT LONG TERM GOAL #3   Title  Pt will demo decreased perineal scar restrictions in order to lengthen pelvic floor mm to eliminate completely    Time  4    Period  Weeks    Status  Achieved      PT LONG TERM GOAL #4   Title  Pt will demo proper coordination of pelvic floor without abdominal straining in order to minimize pelvic floor dysfunction and improve QOL    Time  8    Period  Weeks    Status  Achieved      PT LONG TERM GOAL #5   Title  Pt will report improved stool consistency based on Bristol Stool Type ( from 1 to 3-4) in order to minimize hemorrhoids    Time  10    Period  Weeks    Status  On-going            Plan - 04/13/17 0908    Clinical Impression Statement Pt has achieved 2/5 goals. Pt demo'd decreased perineal scar restrictions which allowed for increased eplvic floor ROM. Pt showed more upright posture, stronger postural muscles, equally aligned pelvic girdle and shoulders after returning after one month. This indicates a positive outcome with her prior HEP/ manual Tx that were customized to address her scoliosis.  Assessed pt with intrarectal assessment today which showed increased posterior tensions at puborectalis. Pt showed poor carryover with coordination training from past visits as she demo'd dyssynergia and required cues to correct proper movement of pelvic floor with respiratory diaphragm. Pt demo's decreased tensions and was able to self-correct pelvic floor coordination post Tx. Pt  also practiced proper pelvic floor coordination in an upright position with foot stool under feet to simulate toileting technique. Pt would benefit from a few more Tx to maintain good carryover with proper coordination of pelvic floor and decreased pelvic floor tensions.   Pt states she has started to drink more water. Pt was advised to complete a food diary and to return it at next session. Pt remains motivated despite reporting no change to her bowel movements (Stool Type 1).   Pt tolerated today's Tx without complaints. Pt continues to benefits from skilled PT.       Rehab Potential  Good    PT Frequency  1x / week    PT Duration  12 weeks    PT Treatment/Interventions  ADLs/Self Care Home Management;Gait training;Stair training;Moist Heat;Traction;Therapeutic activities;Therapeutic exercise;Neuromuscular re-education;Patient/family education;Manual techniques;Energy conservation;Passive range of motion;Dry needling;Taping;Balance training    Consulted and Agree with Plan of Care  Patient       Patient will benefit from skilled therapeutic intervention in order to improve the following deficits and impairments:  Improper body mechanics, Pain, Postural dysfunction, Decreased strength, Decreased safety awareness, Decreased coordination, Decreased activity tolerance, Decreased scar mobility, Decreased range of motion, Difficulty walking, Decreased mobility, Decreased endurance, Decreased balance, Increased muscle spasms, Hypomobility, Increased fascial restricitons, Impaired flexibility  Visit Diagnosis: Myalgia  Other lack of coordination  Other symptoms and signs involving the musculoskeletal system     Problem List Patient Active Problem List   Diagnosis Date Noted  . Pedal edema 12/29/2016  . BRBPR (bright red blood per rectum) 04/09/2016  . Weakness 04/09/2016  . Sleep disturbance 04/09/2016  . Bowel habit changes 01/13/2016  . Hemorrhoids, external 01/13/2016  . Status post  total shoulder replacement 11/27/2015  . Health maintenance examination 10/07/2015  . Pseudogout   . Left shoulder pain 11/04/2014  . Carotid stenosis 10/18/2014  . Advanced care planning/counseling discussion 09/26/2014  . Diverticulosis of colon without hemorrhage 09/26/2014  . CKD (chronic kidney disease) stage 3, GFR 30-59 ml/min (HCC) 08/02/2014  . Restless legs 07/04/2014  . Dizziness 06/13/2014  . Lower abdominal pain 01/07/2014  . Primary osteoarthritis of both hands 11/21/2013  . Insomnia 10/23/2013  . Medicare annual wellness visit, subsequent 06/08/2013  . GERD (gastroesophageal reflux disease) 04/02/2013  . Anxiety associated with depression 12/26/2012  . Syncope due to orthostatic hypotension 04/08/2012  . Prediabetes 03/20/2012  . Heart murmur 09/13/2011  . Syncope 08/02/2011  . HYPERTENSION, BENIGN 02/24/2007  . Hypothyroidism 11/02/2006  . HLD (hyperlipidemia) 11/02/2006  . Glaucoma 11/02/2006  . HEMORRHOIDS, INTERNAL 11/02/2006  . Chronic constipation 11/02/2006  . Irritable bowel syndrome with constipation 11/02/2006  . Osteoporosis 11/02/2006  . Osteoarthritis 10/20/2006    Jerl Mina ,PT, DPT, E-RYT  04/13/2017, 9:16 AM  [place] MAIN [facility] SERVICES [address] [place], [place], [address] Phone: [phone]   Fax:  [phone]  Name: Brooke Beasley MRN: [id number] Date of Birth: 1930-05-15

## 2017-04-15 ENCOUNTER — Encounter: Payer: Self-pay | Admitting: Family Medicine

## 2017-04-15 ENCOUNTER — Ambulatory Visit: Payer: PPO | Admitting: Family Medicine

## 2017-04-15 VITALS — BP 120/68 | HR 74 | Temp 97.9°F | Wt 169.0 lb

## 2017-04-15 DIAGNOSIS — K219 Gastro-esophageal reflux disease without esophagitis: Secondary | ICD-10-CM

## 2017-04-15 DIAGNOSIS — K581 Irritable bowel syndrome with constipation: Secondary | ICD-10-CM

## 2017-04-15 DIAGNOSIS — I10 Essential (primary) hypertension: Secondary | ICD-10-CM

## 2017-04-15 DIAGNOSIS — R011 Cardiac murmur, unspecified: Secondary | ICD-10-CM

## 2017-04-15 MED ORDER — SIMETHICONE 80 MG PO CHEW: 80.0000 mg | CHEWABLE_TABLET | Freq: Two times a day (BID) | ORAL | Status: DC

## 2017-04-15 NOTE — Patient Instructions (Addendum)
If interested, check with pharmacy about new 2 shot shingles series (shingrix).  You  are doing well today.  Continue current medicines.  Exclude gas producing foods (beans, onions, celery, carrots, raisins, bananas, apricots, prunes, brussel sprouts, wheat germ, pretzels) Continue gas x daily.

## 2017-04-15 NOTE — Assessment & Plan Note (Signed)
Ongoing despite pepcid. Did not respond to pantoprazole.

## 2017-04-15 NOTE — Assessment & Plan Note (Signed)
Chronic, stable. Continue current regimen. 

## 2017-04-15 NOTE — Progress Notes (Signed)
BP 120/68 (BP Location: Left Arm, Patient Position: Sitting, Cuff Size: Normal)   Pulse 74   Temp 97.9 F (36.6 C) (Oral)   Wt 169 lb (76.7 kg)   SpO2 97%   BMI 30.91 kg/m    CC: 6 mo f/u visit Subjective:    Patient ID: Brooke Beasley, female    DOB: 1930-10-29, 83 y.o.   MRN: [id number]  HPI: Brooke Beasley is a 83 y.o. female presenting on 04/15/2017 for 6 mo follow-up   Saw GI Jacqulyn Liner) earlier this week. Note not available. Continues seeing PFPT ([facility]). She continues colace 100mg  bid and metamucil once daily. Daily PPI didn't help. Very rare narcotic use. She does take klonopin 0.5mg  nightly.   shingrix - discussed  Relevant past medical, surgical, family and social history reviewed and updated as indicated. Interim medical history since our last visit reviewed. Allergies and medications reviewed and updated. Outpatient Medications Prior to Visit  Medication Sig Dispense Refill  . acetaminophen (TYLENOL) 500 MG tablet Take 500-1,000 mg by mouth at bedtime. Depends on pain    . amLODipine (NORVASC) 10 MG tablet Take 1 tablet (10 mg total) by mouth daily. 90 tablet 1  . bimatoprost (LUMIGAN) 0.01 % SOLN Place 1 drop into both eyes at bedtime.    Marland Kitchen Bioflavonoid Products (VITAMIN C) CHEW Chew 1 tablet by mouth daily.    . cholecalciferol (VITAMIN D) 1000 units tablet Take 1,000 Units by mouth daily.    . clonazePAM (KLONOPIN) 0.5 MG tablet TAKE 1 TABLET BY MOUTH AT BEDTIME AS NEEDED FOR ANXIETY 30 tablet 3  . colchicine 0.6 MG tablet Take 1 tablet by mouth daily as needed 90 tablet 0  . docusate sodium (COLACE) 100 MG capsule Take 1 capsule (100 mg total) by mouth 2 (two) times daily.  0  . famotidine (PEPCID) 20 MG tablet Take 1 tablet (20 mg total) by mouth daily as needed for heartburn or indigestion.    . fish oil-omega-3 fatty acids 1000 MG capsule Take 1 g by mouth daily.     . furosemide (LASIX) 20 MG tablet Take 1 tablet (20 mg total) by mouth daily as  needed for edema. 30 tablet 3  . HYDROcodone-acetaminophen (NORCO) 5-325 MG tablet Take 1-2 tablets by mouth every 6 (six) hours as needed for moderate pain. MAXIMUM TOTAL ACETAMINOPHEN DOSE IS 4000 MG PER DAY 50 tablet 0  . levothyroxine (SYNTHROID, LEVOTHROID) 75 MCG tablet Take 1 tablet by mouth every day 90 tablet 0  . Misc Natural Products (OSTEO BI-FLEX TRIPLE STRENGTH PO) Take 1 tablet by mouth daily. With Vit D    . potassium chloride (K-DUR) 10 MEQ tablet Take 1 tablet (10 mEq total) by mouth daily as needed (with lasix). 30 tablet 3  . Pramox-PE-Glycerin-Petrolatum (PREPARATION H) 1-0.25-14.4-15 % CREA Use as directed 26 g 0  . pravastatin (PRAVACHOL) 20 MG tablet Take 1 tablet (20 mg total) by mouth daily. 90 tablet 3  . Psyllium (METAMUCIL) 30.9 % POWD daily    . sertraline (ZOLOFT) 100 MG tablet Take 1 tablet (100 mg total) by mouth daily. 90 tablet 3  . TURMERIC PO Take 1 tablet by mouth daily.    Marland Kitchen VITAMIN E PO Take 1 tablet by mouth daily.    . VOLTAREN 1 % GEL APPLY ONE APPLICATION TOPICALLY 3 TIMES DAILY (Patient taking differently: APPLY ONE APPLICATION TOPICALLY daily as needed for shoulder pain) 100 g 3  . pantoprazole (PROTONIX) 40 MG tablet  Take 1 tablet (40 mg total) by mouth daily. 30 tablet 0   No facility-administered medications prior to visit.      Per HPI unless specifically indicated in ROS section below Review of Systems     Objective:    BP 120/68 (BP Location: Left Arm, Patient Position: Sitting, Cuff Size: Normal)   Pulse 74   Temp 97.9 F (36.6 C) (Oral)   Wt 169 lb (76.7 kg)   SpO2 97%   BMI 30.91 kg/m   Wt Readings from Last 3 Encounters:  04/15/17 169 lb (76.7 kg)  02/11/17 168 lb (76.2 kg)  12/29/16 168 lb 4 oz (76.3 kg)    Physical Exam  Constitutional: She appears well-developed and well-nourished. No distress.  HENT:  Mouth/Throat: Oropharynx is clear and moist. No oropharyngeal exudate.  Cardiovascular: Normal rate, regular rhythm  and intact distal pulses.  Murmur (2/6 systolic) heard. Pulmonary/Chest: Effort normal and breath sounds normal. No respiratory distress. She has no wheezes. She has no rales.  Musculoskeletal: She exhibits no edema.  Skin: Skin is warm and dry. No rash noted.  Psychiatric: She has a normal mood and affect.  Nursing note and vitals reviewed.  Results for orders placed or performed in visit on 30/16/01  Basic metabolic panel  Result Value Ref Range   Sodium 132 (L) 135 - 145 mEq/L   Potassium 3.4 (L) 3.5 - 5.1 mEq/L   Chloride 93 (L) 96 - 112 mEq/L   CO2 30 19 - 32 mEq/L   Glucose, Bld 105 (H) 70 - 99 mg/dL   BUN 9 6 - 23 mg/dL   Creatinine, Ser 0.92 0.40 - 1.20 mg/dL   Calcium 9.3 8.4 - 10.5 mg/dL   GFR 61.80 >60.00 mL/min   Lab Results  Component Value Date   TSH 1.97 08/03/2016    Lab Results  Component Value Date   CHOL 181 10/08/2016   HDL 69.90 10/08/2016   LDLCALC 79 10/08/2016   LDLDIRECT 86.0 09/19/2014   TRIG 160.0 (H) 10/08/2016   CHOLHDL 3 10/08/2016       Assessment & Plan:   Problem List Items Addressed This Visit    GERD (gastroesophageal reflux disease)    Ongoing despite pepcid. Did not respond to pantoprazole.       Relevant Medications   simethicone (GAS-X) 80 MG chewable tablet   Heart murmur    Sounds stable. Presumed aortic sclerosis. Known MR.       HYPERTENSION, BENIGN    Chronic, stable. Continue current regimen.       Irritable bowel syndrome with constipation - Primary    Ongoing, predominant concern. Saw GI.  Will continue current bowel regimen.       Relevant Medications   simethicone (GAS-X) 80 MG chewable tablet       Follow up plan: Return in about 6 months (around 10/14/2017) for annual exam, prior fasting for blood work, medicare wellness visit.  [name], MD

## 2017-04-15 NOTE — Assessment & Plan Note (Signed)
Sounds stable. Presumed aortic sclerosis. Known MR.

## 2017-04-15 NOTE — Assessment & Plan Note (Signed)
Ongoing, predominant concern. Saw GI.  Will continue current bowel regimen.

## 2017-04-19 IMAGING — DX DG SHOULDER 1V*L*
2 series · 2 of 2 positions shown · non-contrast
Comparison: MRI 11/29/2014

CLINICAL DATA: Postop left total shoulder arthroplasty.

EXAM:
LEFT SHOULDER - 1 VIEW

[shoulder ap]
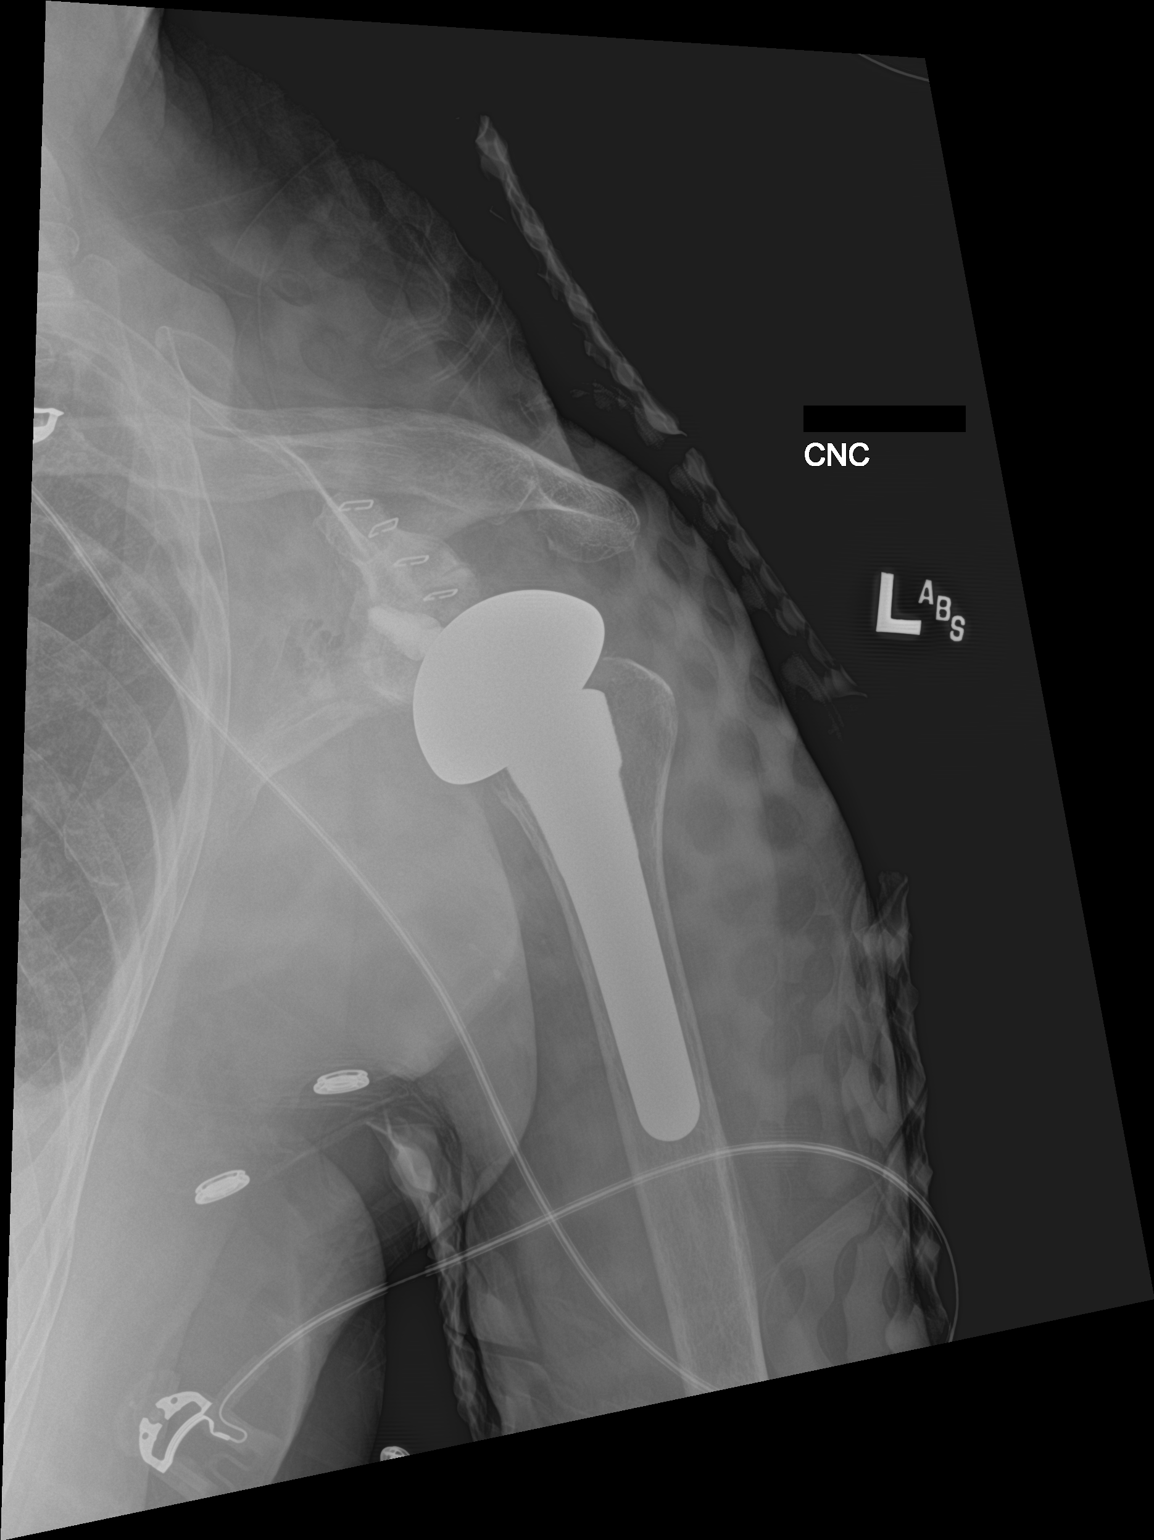

[shoulder obl]
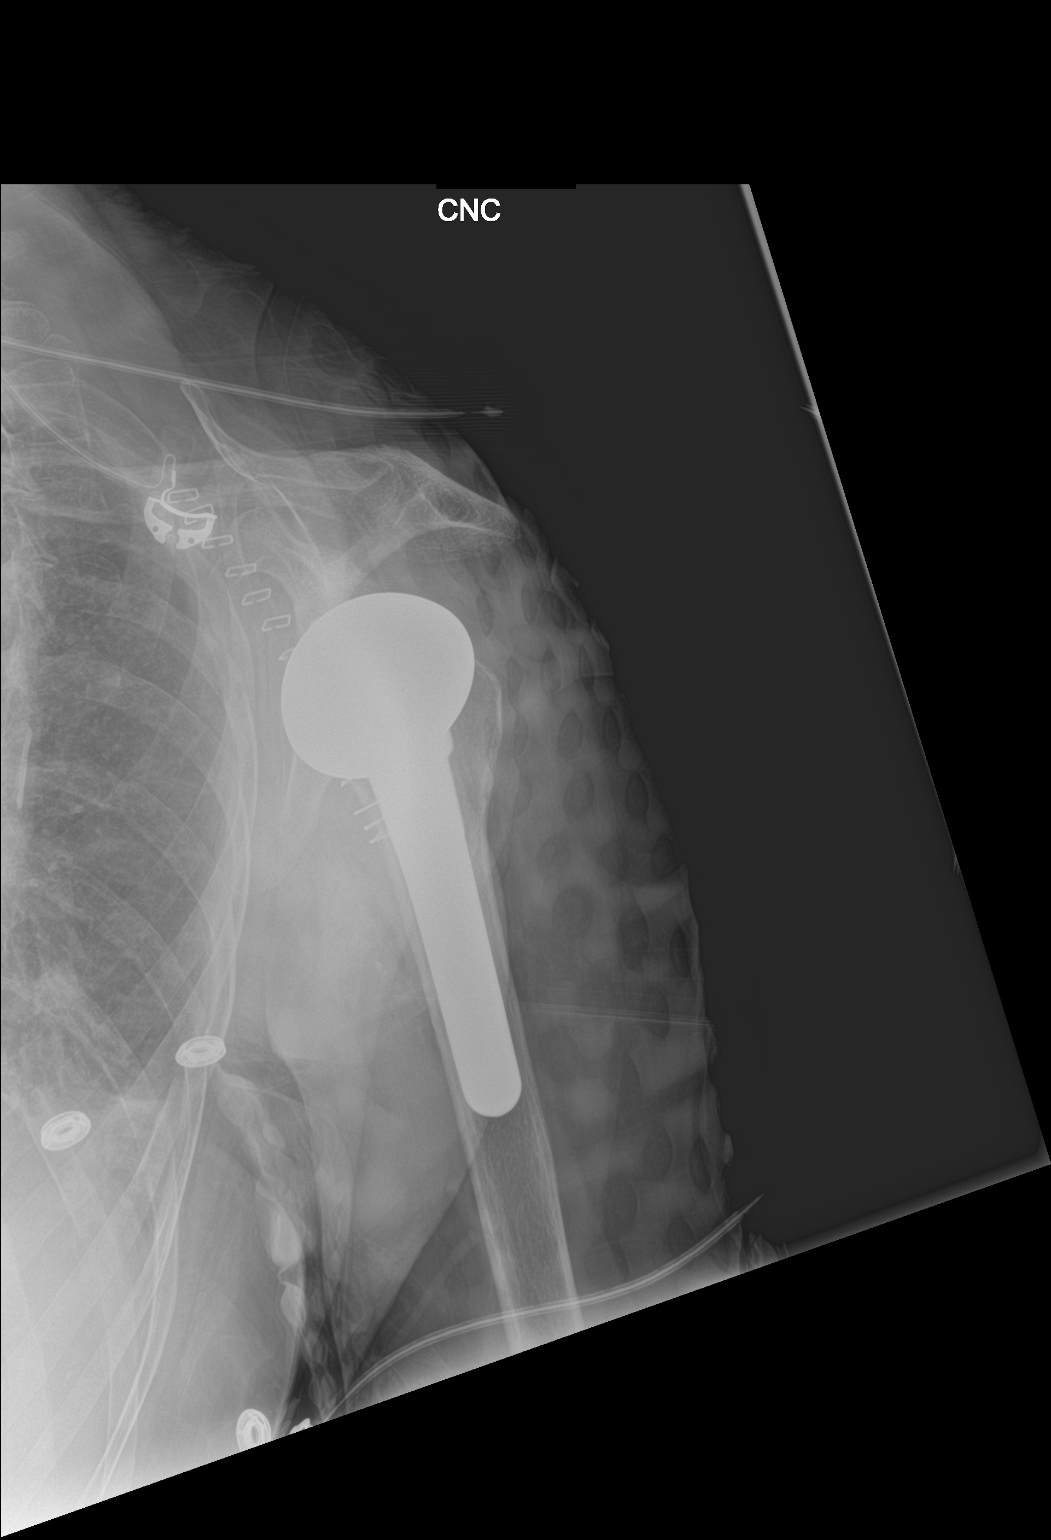

[2 of 2 positions shown; findings below may reference images not displayed]

FINDINGS: The prosthesis appears well positioned.  No complicating features.
IMPRESSION: Well-positioned components of a total left shoulder arthroplasty. No
complicating features.

## 2017-04-27 ENCOUNTER — Other Ambulatory Visit: Payer: Self-pay | Admitting: Family Medicine

## 2017-05-11 ENCOUNTER — Ambulatory Visit: Payer: PPO | Attending: Gastroenterology | Admitting: Physical Therapy

## 2017-05-11 DIAGNOSIS — S299XXA Unspecified injury of thorax, initial encounter: Secondary | ICD-10-CM | POA: Diagnosis not present

## 2017-05-11 DIAGNOSIS — M791 Myalgia, unspecified site: Secondary | ICD-10-CM | POA: Diagnosis not present

## 2017-05-11 DIAGNOSIS — R29898 Other symptoms and signs involving the musculoskeletal system: Secondary | ICD-10-CM | POA: Diagnosis not present

## 2017-05-11 DIAGNOSIS — R278 Other lack of coordination: Secondary | ICD-10-CM | POA: Diagnosis not present

## 2017-05-11 NOTE — Patient Instructions (Signed)
[employer] at [email or web address]    Medical Nutrition Therapy offered at the [facility] ( [place], [place] [address]) on the [place]   Making sense of food labels, grocery choices and restaurant menus is a challenge, especially if you are trying to control a medical condition, lose weight or avoid food allergens.  Our medical nutrition therapy program offers eating, cooking and food shopping guidelines tailored to each patient's individual needs. We can help you find healthy recipes, understand whether specific dietary approaches, such as the Mediterranean diet, might be helpful, and make sense of conflicting nutritional advice you may hear in the media.  Patients are referred by a physician and receive one-on-one nutrition counseling from a registered dietitian. This program can assist with a range of medical goals and conditions, including:      Weight loss     High blood pressure     Diabetes     Food allergies     Renal failure

## 2017-05-12 NOTE — Therapy (Addendum)
[place] MAIN [facility] SERVICES [address] [place], [place], [address] Phone: [phone]   Fax:  [phone]  Physical Therapy Treatment / Discharge Summary   Patient Details  Name: Brooke Beasley MRN: [id number] Date of Birth: [birth year] Referring Provider: Stephens November   Encounter Date: 05/11/2017  PT End of Session - 05/12/17 1328    Visit Number  7    Number of Visits  12    Authorization Type  g code     PT Start Time  1402    PT Stop Time  1425    PT Time Calculation (min)  23 min    Activity Tolerance  Patient tolerated treatment well;No increased pain    Behavior During Therapy  WFL for tasks assessed/performed       Past Medical History:  Diagnosis Date  . Anxiety   . Carotid stenosis 10/18/2014   R 1-39%, L 40-59%, rpt 1 yr (09/2014)   . CKD (chronic kidney disease) stage 3, GFR 30-59 ml/min (HCC) 08/02/2014  . Degenerative disc disease    LS  . Depression    nervous breakdonw in 1964-out of work for a year  . Diverticulosis    severe by colonoscopy  . Family history of adverse reaction to anesthesia    n/v  . GERD (gastroesophageal reflux disease)   . Glaucoma   . Heart murmur 5/13   mitral regurge - on echo   . History of hiatal hernia   . History of shingles   . HTN (hypertension)   . Hyperlipidemia   . Hypertension   . Hypothyroidism   . IBS 11/02/2006  . Intestinal bacterial overgrowth    In small colon  . Maxillary fracture ([place]) 04/08/2012  . Osteoarthritis 2016   Jefm Bryant)  . Osteoporosis    dexa 2011  . Pseudogout 2016   shoulders ([place])    Past Surgical History:  Procedure Laterality Date  . barium enema  2012   severe diverticulosis, redundant colon  . Bowel obstruction  1999   no surgery in hosp x 3 days  . COLONOSCOPY  1. 1999  2. 11/04   1. Not finished  2. Slight hemorrhage rectosigmoid area, severe sig diverticulosis  . Dexa  1. 2725-3664   2. 9/04  3. 3/08    1. OP  2. OP,  borderline, spine -2.44T  3. decreased BMD-OP  . DEXA  2011   OP in spine  . DG KNEE 1-2 VIEWS BILAT     LS x-ray with degenerative disc and facet change  . ESOPHAGOGASTRODUODENOSCOPY     Negative  . Hemorrhoid procedure  4/08  . JOINT REPLACEMENT    . TONSILLECTOMY    . TOTAL SHOULDER ARTHROPLASTY Left 11/27/2015   Corky Mull, MD  . TOTAL SHOULDER REVISION Left 03/16/2016   Procedure: TOTAL SHOULDER REVISION;  Surgeon: Corky Mull, MD;  Location: [facility] ORS;  Service: Orthopedics;  Laterality: Left;  . US ECHOCARDIOGRAPHY  07/345   Normal systolic fxn with EF 42-59%.  Focal basal septal hypertrophy.  Mild diastolic dysfunction.  Mild MR.    There were no vitals filed for this visit.  Subjective Assessment - 05/11/17 1420    Subjective  Pt reported she had a good week last week.  She started drinking warm tea instead of coffee every morning and she noticed her bowel movements were improved with less pellet shapes and more fully shaped logs. Pt returned her 3 week food  diary today   Pertinent History  2 vaginal deliveries with episiotomy. "When I had my first one, they sewed me up. I had an infection" .  Hobbies: work in the yard, pull weeds. Pt has not felt like doing this. Pt 's husband passed away 1 year ago. Dtr and her husband will be moving in with her soon.      Patient Stated Goals  "I do not get around much because of this"  " I would like to feeling better, like a human being. I feel like I am dragging and full of poo all the time."           [facility] PT Assessment - 05/12/17 1329      Assessment   Medical Diagnosis  less forward head, bright affect                    [facility] Adult PT Treatment/Exercise - 05/12/17 1330      Therapeutic Activites    Therapeutic Activities  -- reviewed food chart, referral to nutritionist, drinking wate                  PT Long Term Goals - 05/11/17 1422      PT LONG TERM GOAL #1   Title  Pt will report increased  "good days" per month from 7 days to > 14 days in order to improve QOL    Time  12    Period  Weeks    Status  Partially Met      PT LONG TERM GOAL #2   Title  Pt will demo decreased score on COREFO  from 32 % to < 27 % in order to restore bowel function  (1/16: 25%)   (Pended)     Time  12    Period  Weeks     Achieved         Title  Pt will demo decreased perineal scar restrictions in order to lengthen pelvic floor mm to eliminate completely    Time  4    Period  Weeks    Status  Achieved      PT LONG TERM GOAL #4   Title  Pt will demo proper coordination of pelvic floor without abdominal straining in order to minimize pelvic floor dysfunction and improve QOL    Time  8    Period  Weeks    Status  Achieved      PT LONG TERM GOAL #5   Title  Pt will report improved stool consistency based on [place] Stool Type ( from 1 to 3-4) in order to minimize hemorrhoids    Time  10    Period  Weeks    Status  Achieved            Plan - 05/12/17 1331    Clinical Impression Statement Pt has achieved 4/5 goals and partially met her remaining goal. Pt has demo'd significantly decreased perineal scars, improved pelvic floor coordination without straining. Pt also decreased her COREFO score which indicates improvement. Pt reports she has had a good week this past week as she started to drink warm tea in the more morning instead of coffee. Her bowels have improved from Type1 to more Type 4 consistency.  Pt returned her food diary which showed decreased fiber intake. Pt stated she tries to eat more vegetables but they can cause bowel issues. Pt was recommended to work with a nutritionist to improve fiber intake and  was provided two contacts. Pt is ready for d/c at this time.     Rehab Potential  Good    PT Frequency  1x / week    PT Duration  12 weeks    PT Treatment/Interventions  ADLs/Self Care Home Management;Gait training;Stair training;Moist Heat;Traction;Therapeutic  activities;Therapeutic exercise;Neuromuscular re-education;Patient/family education;Manual techniques;Energy conservation;Passive range of motion;Dry needling;Taping;Balance training    Consulted and Agree with Plan of Care  Patient       Patient will benefit from skilled therapeutic intervention in order to improve the following deficits and impairments:  Improper body mechanics, Pain, Postural dysfunction, Decreased strength, Decreased safety awareness, Decreased coordination, Decreased activity tolerance, Decreased scar mobility, Decreased range of motion, Difficulty walking, Decreased mobility, Decreased endurance, Decreased balance, Increased muscle spasms, Hypomobility, Increased fascial restricitons, Impaired flexibility  Visit Diagnosis: Myalgia  Other lack of coordination  Other symptoms and signs involving the musculoskeletal system     Problem List Patient Active Problem List   Diagnosis Date Noted  . Pedal edema 12/29/2016  . BRBPR (bright red blood per rectum) 04/09/2016  . Weakness 04/09/2016  . Sleep disturbance 04/09/2016  . Bowel habit changes 01/13/2016  . Hemorrhoids, external 01/13/2016  . Status post total shoulder replacement 11/27/2015  . Health maintenance examination 10/07/2015  . Pseudogout   . Left shoulder pain 11/04/2014  . Carotid stenosis 10/18/2014  . Advanced care planning/counseling discussion 09/26/2014  . Diverticulosis of colon without hemorrhage 09/26/2014  . CKD (chronic kidney disease) stage 3, GFR 30-59 ml/min (HCC) 08/02/2014  . Restless legs 07/04/2014  . Dizziness 06/13/2014  . Lower abdominal pain 01/07/2014  . Primary osteoarthritis of both hands 11/21/2013  . Insomnia 10/23/2013  . Medicare annual wellness visit, subsequent 06/08/2013  . GERD (gastroesophageal reflux disease) 04/02/2013  . Anxiety associated with depression 12/26/2012  . Syncope due to orthostatic hypotension 04/08/2012  . Prediabetes 03/20/2012  . Heart  murmur 09/13/2011  . Syncope 08/02/2011  . HYPERTENSION, BENIGN 02/24/2007  . Hypothyroidism 11/02/2006  . HLD (hyperlipidemia) 11/02/2006  . Glaucoma 11/02/2006  . HEMORRHOIDS, INTERNAL 11/02/2006  . Chronic constipation 11/02/2006  . Irritable bowel syndrome with constipation 11/02/2006  . Osteoporosis 11/02/2006  . Osteoarthritis 10/20/2006    Jerl Mina ,PT, DPT, E-RYT  05/12/2017, 1:32 PM  [place] MAIN [facility] SERVICES [address] [place], [place], [address] Phone: [phone]   Fax:  [phone]  Name: Brooke Beasley MRN: [id number] Date of Birth: 1930/10/05

## 2017-05-23 ENCOUNTER — Other Ambulatory Visit: Payer: Self-pay | Admitting: Family Medicine

## 2017-05-26 ENCOUNTER — Encounter (HOSPITAL_BASED_OUTPATIENT_CLINIC_OR_DEPARTMENT_OTHER): Payer: Self-pay

## 2017-05-26 ENCOUNTER — Emergency Department (HOSPITAL_BASED_OUTPATIENT_CLINIC_OR_DEPARTMENT_OTHER)
Admission: EM | Admit: 2017-05-26 | Discharge: 2017-05-27 | Disposition: A | Discharge status: Home / Self Care | Payer: PPO | Attending: Emergency Medicine | Admitting: Emergency Medicine

## 2017-05-26 ENCOUNTER — Other Ambulatory Visit: Payer: Self-pay

## 2017-05-26 ENCOUNTER — Emergency Department (HOSPITAL_BASED_OUTPATIENT_CLINIC_OR_DEPARTMENT_OTHER): Payer: PPO

## 2017-05-26 DIAGNOSIS — E039 Hypothyroidism, unspecified: Secondary | ICD-10-CM | POA: Diagnosis not present

## 2017-05-26 DIAGNOSIS — M25511 Pain in right shoulder: Secondary | ICD-10-CM | POA: Diagnosis not present

## 2017-05-26 DIAGNOSIS — Z79899 Other long term (current) drug therapy: Secondary | ICD-10-CM | POA: Insufficient documentation

## 2017-05-26 DIAGNOSIS — I129 Hypertensive chronic kidney disease with stage 1 through stage 4 chronic kidney disease, or unspecified chronic kidney disease: Secondary | ICD-10-CM | POA: Diagnosis not present

## 2017-05-26 DIAGNOSIS — M79621 Pain in right upper arm: Secondary | ICD-10-CM | POA: Diagnosis present

## 2017-05-26 DIAGNOSIS — M6520 Calcific tendinitis, unspecified site: Secondary | ICD-10-CM

## 2017-05-26 DIAGNOSIS — M79601 Pain in right arm: Secondary | ICD-10-CM | POA: Diagnosis not present

## 2017-05-26 DIAGNOSIS — M65221 Calcific tendinitis, right upper arm: Secondary | ICD-10-CM | POA: Diagnosis not present

## 2017-05-26 DIAGNOSIS — Z96612 Presence of left artificial shoulder joint: Secondary | ICD-10-CM | POA: Insufficient documentation

## 2017-05-26 DIAGNOSIS — M7531 Calcific tendinitis of right shoulder: Secondary | ICD-10-CM | POA: Diagnosis not present

## 2017-05-26 DIAGNOSIS — N183 Chronic kidney disease, stage 3 (moderate): Secondary | ICD-10-CM | POA: Diagnosis not present

## 2017-05-26 MED ORDER — METHOCARBAMOL 500 MG PO TABS
1000.0000 mg | ORAL_TABLET | Freq: Once | ORAL | Status: AC
  Administered 2017-05-27: 1000 mg via ORAL
  Filled 2017-05-26: qty 2

## 2017-05-26 MED ORDER — KETOROLAC TROMETHAMINE 60 MG/2ML IM SOLN
30.0000 mg | Freq: Once | INTRAMUSCULAR | Status: AC
  Administered 2017-05-26: 30 mg via INTRAMUSCULAR
  Filled 2017-05-26: qty 2

## 2017-05-26 NOTE — ED Provider Notes (Signed)
[facility] DEPART[facility] Provider Note   CSN: [id number] Arrival date & time: 05/26/17  2235     History   Chief Complaint Chief Complaint  Patient presents with  . Arm Pain    HPI Brooke Beasley is a 84 y.o. female.  The history is provided by the patient.  Shoulder Pain   This is a new problem. The current episode started 2 days ago. The problem occurs constantly. The problem has not changed since onset.The pain is present in the right shoulder and right arm. The quality of the pain is described as aching. The pain is at a severity of 10/10. The pain is severe. Associated symptoms include limited range of motion and stiffness. Pertinent negatives include no numbness. The symptoms are aggravated by activity. She has tried cold for the symptoms. The treatment provided no relief. There has been no history of extremity trauma. Family history is significant for no rheumatoid arthritis.  No chest pain, pain worse with motion.  Will not move the arm.    Past Medical History:  Diagnosis Date  . Anxiety   . Carotid stenosis 10/18/2014   R 1-39%, L 40-59%, rpt 1 yr (09/2014)   . CKD (chronic kidney disease) stage 3, GFR 30-59 ml/min (HCC) 08/02/2014  . Degenerative disc disease    LS  . Depression    nervous breakdonw in 1964-out of work for a year  . Diverticulosis    severe by colonoscopy  . Family history of adverse reaction to anesthesia    n/v  . GERD (gastroesophageal reflux disease)   . Glaucoma   . Heart murmur 5/13   mitral regurge - on echo   . History of hiatal hernia   . History of shingles   . HTN (hypertension)   . Hyperlipidemia   . Hypertension   . Hypothyroidism   . IBS 11/02/2006  . Intestinal bacterial overgrowth    In small colon  . Maxillary fracture ([place]) 04/08/2012  . Osteoarthritis 2016   Jefm [facility])  . Osteoporosis    dexa 2011  . Pseudogout 2016   shoulders ([place])    Patient Active Problem List   Diagnosis Date Noted  .  Pedal edema 12/29/2016  . BRBPR (bright red blood per rectum) 04/09/2016  . Weakness 04/09/2016  . Sleep disturbance 04/09/2016  . Bowel habit changes 01/13/2016  . Hemorrhoids, external 01/13/2016  . Status post total shoulder replacement 11/27/2015  . Health maintenance examination 10/07/2015  . Pseudogout   . Left shoulder pain 11/04/2014  . Carotid stenosis 10/18/2014  . Advanced care planning/counseling discussion 09/26/2014  . Diverticulosis of colon without hemorrhage 09/26/2014  . CKD (chronic kidney disease) stage 3, GFR 30-59 ml/min (HCC) 08/02/2014  . Restless legs 07/04/2014  . Dizziness 06/13/2014  . Lower abdominal pain 01/07/2014  . Primary osteoarthritis of both hands 11/21/2013  . Insomnia 10/23/2013  . Medicare annual wellness visit, subsequent 06/08/2013  . GERD (gastroesophageal reflux disease) 04/02/2013  . Anxiety associated with depression 12/26/2012  . Syncope due to orthostatic hypotension 04/08/2012  . Prediabetes 03/20/2012  . Heart murmur 09/13/2011  . Syncope 08/02/2011  . HYPERTENSION, BENIGN 02/24/2007  . Hypothyroidism 11/02/2006  . HLD (hyperlipidemia) 11/02/2006  . Glaucoma 11/02/2006  . HEMORRHOIDS, INTERNAL 11/02/2006  . Chronic constipation 11/02/2006  . Irritable bowel syndrome with constipation 11/02/2006  . Osteoporosis 11/02/2006  . Osteoarthritis 10/20/2006    Past Surgical History:  Procedure Laterality Date  . barium enema  2012  severe diverticulosis, redundant colon  . Bowel obstruction  1999   no surgery in hosp x 3 days  . COLONOSCOPY  1. 1999  2. 11/04   1. Not finished  2. Slight hemorrhage rectosigmoid area, severe sig diverticulosis  . Dexa  1. [id number]   2. 9/04  3. 3/08    1. OP  2. OP, borderline, spine -2.44T  3. decreased BMD-OP  . DEXA  2011   OP in spine  . DG KNEE 1-2 VIEWS BILAT     LS x-ray with degenerative disc and facet change  . ESOPHAGOGASTRODUODENOSCOPY     Negative  . Hemorrhoid procedure  4/08   . JOINT REPLACEMENT    . TONSILLECTOMY    . TOTAL SHOULDER ARTHROPLASTY Left 11/27/2015   Corky Mull, MD  . TOTAL SHOULDER REVISION Left 03/16/2016   Procedure: TOTAL SHOULDER REVISION;  Surgeon: Corky Mull, MD;  Location: [facility] ORS;  Service: Orthopedics;  Laterality: Left;  . US ECHOCARDIOGRAPHY  05/2631   Normal systolic fxn with EF 35-45%.  Focal basal septal hypertrophy.  Mild diastolic dysfunction.  Mild MR.    OB History    No data available       Home Medications    Prior to Admission medications   Medication Sig Start Date End Date Taking? Authorizing Provider  acetaminophen (TYLENOL) 500 MG tablet Take 500-1,000 mg by mouth at bedtime. Depends on pain    [provider]  amLODipine (NORVASC) 10 MG tablet TAKE 1 TABLET BY MOUTH ONCE DAILY 05/23/17   [name], MD  bimatoprost (LUMIGAN) 0.01 % SOLN Place 1 drop into both eyes at bedtime.    [provider]  Bioflavonoid Products (VITAMIN C) CHEW Chew 1 tablet by mouth daily.    [provider]  cholecalciferol (VITAMIN D) 1000 units tablet Take 1,000 Units by mouth daily.    [provider]  clonazePAM (KLONOPIN) 0.5 MG tablet TAKE 1 TABLET BY MOUTH AT BEDTIME AS NEEDED FOR ANXIETY 03/08/17   [name], MD  colchicine 0.6 MG tablet Take 1 tablet by mouth daily as needed 09/30/16   [name], MD  docusate sodium (COLACE) 100 MG capsule Take 1 capsule (100 mg total) by mouth 2 (two) times daily. 02/11/17   [name], MD  famotidine (PEPCID) 20 MG tablet Take 1 tablet (20 mg total) by mouth daily as needed for heartburn or indigestion. 02/11/17   [name], MD  fish oil-omega-3 fatty acids 1000 MG capsule Take 1 g by mouth daily.     [provider]  furosemide (LASIX) 20 MG tablet Take 1 tablet (20 mg total) by mouth daily as needed for edema. 12/29/16   [name], MD  HYDROcodone-acetaminophen (NORCO) 5-325 MG tablet Take 1-2 tablets by  mouth every 6 (six) hours as needed for moderate pain. MAXIMUM TOTAL ACETAMINOPHEN DOSE IS 4000 MG PER DAY 03/16/16   Poggi, Marshall Cork, MD  levothyroxine (SYNTHROID, LEVOTHROID) 75 MCG tablet Take 1 tablet by mouth every day 09/29/16   [name], MD  levothyroxine (SYNTHROID, LEVOTHROID) 75 MCG tablet TAKE 1 TABLET BY MOUTH ONCE DAILY 04/27/17   [name], MD  Misc Natural Products (OSTEO BI-FLEX TRIPLE STRENGTH PO) Take 1 tablet by mouth daily. With Vit D    [provider]  potassium chloride (K-DUR) 10 MEQ tablet Take 1 tablet (10 mEq total) by mouth daily as needed (with lasix). 12/29/16   [name], MD  Pramox-PE-Glycerin-Petrolatum (PREPARATION H) 1-0.25-14.4-15 %  CREA Use as directed 01/13/16   [name], MD  pravastatin (PRAVACHOL) 20 MG tablet Take 1 tablet (20 mg total) by mouth daily. 12/29/16   [name], MD  Psyllium (METAMUCIL) 30.9 % POWD daily 02/11/17   [name], MD  sertraline (ZOLOFT) 100 MG tablet Take 1 tablet (100 mg total) by mouth daily. 12/29/16   [name], MD  simethicone (GAS-X) 80 MG chewable tablet Chew 1 tablet (80 mg total) by mouth 2 (two) times daily. 04/15/17   [name], MD  TURMERIC PO Take 1 tablet by mouth daily.    [provider]  VITAMIN E PO Take 1 tablet by mouth daily.    [provider]  VOLTAREN 1 % GEL APPLY ONE APPLICATION TOPICALLY 3 TIMES DAILY Patient taking differently: APPLY ONE APPLICATION TOPICALLY daily as needed for shoulder pain 09/26/14   [name], MD    Family History Family History  Problem Relation Age of Onset  . Diabetes Brother        post-op  . Coronary artery disease Brother   . Diabetes Paternal Grandfather   . Breast cancer Cousin     Social History Social History   Tobacco Use  . Smoking status: Never Smoker  . Smokeless tobacco: Never Used  Substance Use Topics  . Alcohol use: No  . Drug use: No     Allergies   Prednisone;  Ace inhibitors; Alendronate sodium; Losartan; Other; Paroxetine hcl; Risedronate sodium; Silenor [doxepin hcl]; Simvastatin; Sulfonamide derivatives; Tramadol; and Influenza vaccines   Review of Systems Review of Systems  Constitutional: Negative for fever.  Respiratory: Negative for chest tightness and shortness of breath.   Cardiovascular: Negative for chest pain, palpitations and leg swelling.  Musculoskeletal: Positive for arthralgias and stiffness.  Neurological: Negative for numbness.  All other systems reviewed and are negative.    Physical Exam Updated Vital Signs BP (!) 169/90 (BP Location: Left Arm)   Pulse 80   Temp 98.4 F (36.9 C) (Oral)   Resp 20   Ht 5\' 2"  (1.575 m)   Wt 76.2 kg (168 lb)   SpO2 98%   BMI 30.73 kg/m   Physical Exam  Constitutional: She is oriented to person, place, and time. She appears well-developed and well-nourished. No distress.  HENT:  Head: Normocephalic and atraumatic.  Mouth/Throat: No oropharyngeal exudate.  Eyes: Conjunctivae are normal. Pupils are equal, round, and reactive to light.  Neck: Normal range of motion. Neck supple.  Cardiovascular: Normal rate, regular rhythm, normal heart sounds and intact distal pulses.  Pulmonary/Chest: Effort normal and breath sounds normal. She has no wheezes.  Abdominal: Soft. Bowel sounds are normal. There is no tenderness.  Musculoskeletal:       Left shoulder: She exhibits pain. She exhibits no bony tenderness, no swelling, no effusion, no crepitus, no deformity, no laceration, no spasm, normal pulse and normal strength.       Left elbow: Normal.       Left wrist: Normal.       Left upper arm: Normal.       Left hand: Normal. She exhibits normal capillary refill. Normal sensation noted. Normal strength noted.  Pain over deltoid tendon, does not want to move secondary to pain but does slowly.    Neurological: She is alert and oriented to person, place, and time. She displays normal reflexes.    Skin: Skin is warm and dry. Capillary refill takes less than 2 seconds.  Psychiatric: She has a normal mood  and affect.     ED Treatments / Results   Vitals:   05/26/17 2244 05/27/17 0101  BP: (!) 169/90 (!) 159/83  Pulse: 80 75  Resp: 20 20  Temp: 98.4 F (36.9 C)   SpO2: 98% 93%    Radiology  Results for orders placed or performed in visit on 42/87/68  Basic metabolic panel  Result Value Ref Range   Sodium 132 (L) 135 - 145 mEq/L   Potassium 3.4 (L) 3.5 - 5.1 mEq/L   Chloride 93 (L) 96 - 112 mEq/L   CO2 30 19 - 32 mEq/L   Glucose, Bld 105 (H) 70 - 99 mg/dL   BUN 9 6 - 23 mg/dL   Creatinine, Ser 0.92 0.40 - 1.20 mg/dL   Calcium 9.3 8.4 - 10.5 mg/dL   GFR 61.80 >60.00 mL/min   Dg Shoulder Right  Result Date: 05/27/2017 CLINICAL DATA:  Right shoulder pain for 2 days. Severe pain worse this morning. No current injury. History of pseudogout. EXAM: RIGHT SHOULDER - 2+ VIEW COMPARISON:  MRI right shoulder 11/29/2014. Right shoulder radiographs 09/24/2011 FINDINGS: Degenerative changes in the glenohumeral joint with near bone on bone appearance and osteophytes on both sides of the joint. Degenerative changes in the acromioclavicular joint. Subacromial calcification may indicate calcific tendinosis. No evidence of acute fracture or dislocation. No focal bone lesion or bone destruction. Soft tissues are unremarkable. IMPRESSION: Prominent degenerative changes in the glenohumeral joint. Subacromial calcification may indicate calcific tendinosis. No evidence of acute fracture or dislocation. Electronically Signed   By: Lucienne Capers M.D.   On: 05/27/2017 00:30    Procedures Procedures (including critical care time)  Medications Ordered in ED Medications  ketorolac (TORADOL) injection 30 mg (not administered)  methocarbamol (ROBAXIN) tablet 1,000 mg (not administered)      Final Clinical Impressions(s) / ED Diagnoses  Must range shoulder as your you will develop an adhesive  capsulitis if your do not.  Shoulder exercises printed for patient.  Follow up with orthopedics for ongoing symptoms.  Will start voltaren as patient has many medication allergies.    Return for weakness, numbness, changes in vision or speech,  fevers > 100.4 unrelieved by medication, shortness of breath, intractable vomiting, or diarrhea, abdominal pain, Inability to tolerate liquids or food, cough, altered mental status or any concerns. No signs of systemic illness or infection. The patient is nontoxic-appearing on exam and vital signs are within normal limits.    I have reviewed the triage vital signs and the nursing notes. Pertinent labs &imaging results that were available during my care of the patient were reviewed by me and considered in my medical decision making (see chart for details).  After history, exam, and medical workup I feel the patient has been appropriately medically screened and is safe for discharge home. Pertinent diagnoses were discussed with the patient. Patient was given return precautions.     Yolonda Purtle, MD 05/27/17 [phone]

## 2017-05-26 NOTE — ED Notes (Signed)
Pt states she took "some gout pills" and naproxen without relief. Pt took the naproxen at 18:00 and then colchicine at 1900 and 21:00. Pt also took 2 dramamine at 2100.

## 2017-05-26 NOTE — ED Triage Notes (Addendum)
C/o pain to right UE x 2 days-denies injury-entered triage cradling right arm with left hand-states pain is much worse with movement

## 2017-05-27 ENCOUNTER — Other Ambulatory Visit: Payer: Self-pay

## 2017-05-27 ENCOUNTER — Encounter (HOSPITAL_BASED_OUTPATIENT_CLINIC_OR_DEPARTMENT_OTHER): Payer: Self-pay | Admitting: Emergency Medicine

## 2017-05-27 DIAGNOSIS — M25511 Pain in right shoulder: Secondary | ICD-10-CM | POA: Diagnosis not present

## 2017-05-27 MED ORDER — DICLOFENAC SODIUM ER 100 MG PO TB24: 100.0000 mg | ORAL_TABLET | Freq: Every day | ORAL | 0 refills | Status: DC

## 2017-06-03 DIAGNOSIS — M75122 Complete rotator cuff tear or rupture of left shoulder, not specified as traumatic: Secondary | ICD-10-CM | POA: Diagnosis not present

## 2017-06-03 DIAGNOSIS — M19011 Primary osteoarthritis, right shoulder: Secondary | ICD-10-CM | POA: Diagnosis not present

## 2017-06-06 ENCOUNTER — Encounter: Payer: Self-pay | Admitting: Family Medicine

## 2017-07-10 ENCOUNTER — Other Ambulatory Visit: Payer: Self-pay | Admitting: Family Medicine

## 2017-07-11 NOTE — Telephone Encounter (Signed)
Eprescribed.

## 2017-07-21 ENCOUNTER — Other Ambulatory Visit: Payer: Self-pay | Admitting: Family Medicine

## 2017-08-25 DIAGNOSIS — H524 Presbyopia: Secondary | ICD-10-CM | POA: Diagnosis not present

## 2017-08-25 DIAGNOSIS — H401131 Primary open-angle glaucoma, bilateral, mild stage: Secondary | ICD-10-CM | POA: Diagnosis not present

## 2017-10-10 ENCOUNTER — Ambulatory Visit: Payer: PPO

## 2017-10-13 ENCOUNTER — Ambulatory Visit (INDEPENDENT_AMBULATORY_CARE_PROVIDER_SITE_OTHER): Payer: PPO

## 2017-10-13 ENCOUNTER — Other Ambulatory Visit: Payer: Self-pay | Admitting: Family Medicine

## 2017-10-13 VITALS — BP 118/84 | HR 61 | Temp 98.0°F | Ht 62.0 in | Wt 163.5 lb

## 2017-10-13 DIAGNOSIS — N183 Chronic kidney disease, stage 3 unspecified: Secondary | ICD-10-CM

## 2017-10-13 DIAGNOSIS — R7303 Prediabetes: Secondary | ICD-10-CM | POA: Diagnosis not present

## 2017-10-13 DIAGNOSIS — E785 Hyperlipidemia, unspecified: Secondary | ICD-10-CM

## 2017-10-13 DIAGNOSIS — E039 Hypothyroidism, unspecified: Secondary | ICD-10-CM

## 2017-10-13 DIAGNOSIS — Z0000 Encounter for general adult medical examination without abnormal findings: Secondary | ICD-10-CM

## 2017-10-13 LAB — COMPREHENSIVE METABOLIC PANEL
ALT: 12 U/L (ref 0–35)
AST: 18 U/L (ref 0–37)
Albumin: 4.2 g/dL (ref 3.5–5.2)
Alkaline Phosphatase: 101 U/L (ref 39–117)
BILIRUBIN TOTAL: 0.6 mg/dL (ref 0.2–1.2)
BUN: 18 mg/dL (ref 6–23)
CHLORIDE: 103 meq/L (ref 96–112)
CO2: 29 meq/L (ref 19–32)
CREATININE: 1.08 mg/dL (ref 0.40–1.20)
Calcium: 9.4 mg/dL (ref 8.4–10.5)
GFR: 51.26 mL/min — ABNORMAL LOW (ref >60.00)
GLUCOSE: 108 mg/dL — AB (ref 70–99)
Potassium: 4 mEq/L (ref 3.5–5.1)
SODIUM: 140 meq/L (ref 135–145)
Total Protein: 7 g/dL (ref 6.0–8.3)

## 2017-10-13 LAB — CBC WITH DIFFERENTIAL/PLATELET
BASOS PCT: 0.6 % (ref 0.0–3.0)
Basophils Absolute: 0 10*3/uL (ref 0.0–0.1)
EOS ABS: 0.4 10*3/uL (ref 0.0–0.7)
Eosinophils Relative: 7.1 % — ABNORMAL HIGH (ref 0.0–5.0)
HEMATOCRIT: 40.5 % (ref 36.0–46.0)
Hemoglobin: 13.9 g/dL (ref 12.0–15.0)
LYMPHS PCT: 31.6 % (ref 12.0–46.0)
Lymphs Abs: 1.9 10*3/uL (ref 0.7–4.0)
MCHC: 34.2 g/dL (ref 30.0–36.0)
MCV: 92.7 fl (ref 78.0–100.0)
Monocytes Absolute: 0.8 10*3/uL (ref 0.1–1.0)
Monocytes Relative: 12.6 % — ABNORMAL HIGH (ref 3.0–12.0)
Neutro Abs: 2.9 10*3/uL (ref 1.4–7.7)
Neutrophils Relative %: 48.1 % (ref 43.0–77.0)
Platelets: 273 10*3/uL (ref 150.0–400.0)
RBC: 4.37 Mil/uL (ref 3.87–5.11)
RDW: 14.1 % (ref 11.5–15.5)
WBC: 5.9 10*3/uL (ref 4.0–10.5)

## 2017-10-13 LAB — LIPID PANEL
CHOLESTEROL: 246 mg/dL — AB (ref 0–200)
HDL: 73.5 mg/dL (ref >39.00)
LDL CALC: 148 mg/dL — AB (ref 0–99)
NonHDL: 172.04
Total CHOL/HDL Ratio: 3
Triglycerides: 119 mg/dL (ref 0.0–149.0)
VLDL: 23.8 mg/dL (ref 0.0–40.0)

## 2017-10-13 LAB — TSH: TSH: 16.34 u[IU]/mL — AB (ref 0.35–4.50)

## 2017-10-13 LAB — HEMOGLOBIN A1C: HEMOGLOBIN A1C: 5.9 % (ref 4.6–6.5)

## 2017-10-13 LAB — VITAMIN D 25 HYDROXY (VIT D DEFICIENCY, FRACTURES): VITD: 28.02 ng/mL — ABNORMAL LOW (ref 30.00–100.00)

## 2017-10-13 NOTE — Progress Notes (Signed)
Subjective:   Brooke Beasley is a 84 y.o. female who presents for Medicare Annual (Subsequent) preventive examination.  Review of Systems:  N/A Cardiac Risk Factors include: advanced age (>68men, >68 women);dyslipidemia;hypertension     Objective:     Vitals: BP 118/84 (BP Location: Right Arm, Patient Position: Sitting, Cuff Size: Normal)   Pulse 61   Temp 98 F (36.7 C) (Oral)   Ht 5\' 2"  (1.575 m)   Wt 163 lb 8 oz (74.2 kg)   SpO2 98%   BMI 29.90 kg/m   Body mass index is 29.9 kg/m.  Advanced Directives 10/13/2017 05/26/2017 01/27/2017 10/08/2016 11/27/2015 11/17/2015 04/08/2012  Does Patient Have a Medical Advance Directive? Yes Yes Yes Yes Yes Yes Patient has advance directive, copy not in chart  Type of Advance Directive [place];Living will [place];Living will - [place];Living will Living will - [place]  Does patient want to make changes to medical advance directive? - - - - No - Patient declined No - Patient declined -  Copy of [place] in Chart? No - copy requested - - No - copy requested Yes Yes Copy requested from family  Pre-existing out of facility DNR order (yellow form or pink MOST form) - - - - - - No    Tobacco Social History   Tobacco Use  Smoking Status Never Smoker  Smokeless Tobacco Never Used     Counseling given: No   Clinical Intake:  Pre-visit preparation completed: Yes  Pain : No/denies pain Pain Score: 0-No pain     Nutritional Status: BMI 25 -29 Overweight Nutritional Risks: None Diabetes: No  How often do you need to have someone help you when you read instructions, pamphlets, or other written materials from your doctor or pharmacy?: 1 - Never What is the last grade level you completed in school?: 12th grade  Interpreter Needed?: No  Comments: patient and daughter/son-in-law live together Information entered by :: LPinson,  LPN  Past Medical History:  Diagnosis Date  . Anxiety   . Carotid stenosis 10/18/2014   R 1-39%, L 40-59%, rpt 1 yr (09/2014)   . CKD (chronic kidney disease) stage 3, GFR 30-59 ml/min (HCC) 08/02/2014  . Degenerative disc disease    LS  . Depression    nervous breakdonw in 1964-out of work for a year  . Diverticulosis    severe by colonoscopy  . Family history of adverse reaction to anesthesia    n/v  . GERD (gastroesophageal reflux disease)   . Glaucoma   . Heart murmur 5/13   mitral regurge - on echo   . History of hiatal hernia   . History of shingles   . HTN (hypertension)   . Hyperlipidemia   . Hypertension   . Hypothyroidism   . IBS 11/02/2006  . Intestinal bacterial overgrowth    In small colon  . Maxillary fracture ([place]) 04/08/2012  . Osteoarthritis 2016   Jefm [facility])  . Osteoporosis    dexa 2011  . Pseudogout 2016   shoulders ([place])   Past Surgical History:  Procedure Laterality Date  . barium enema  2012   severe diverticulosis, redundant colon  . Bowel obstruction  1999   no surgery in hosp x 3 days  . COLONOSCOPY  1. 1999  2. 11/04   1. Not finished  2. Slight hemorrhage rectosigmoid area, severe sig diverticulosis  . Dexa  1. [id number]  2. 9/04  3. 3/08    1. OP  2. OP, borderline, spine -2.44T  3. decreased BMD-OP  . DEXA  2011   OP in spine  . DG KNEE 1-2 VIEWS BILAT     LS x-ray with degenerative disc and facet change  . ESOPHAGOGASTRODUODENOSCOPY     Negative  . Hemorrhoid procedure  4/08  . JOINT REPLACEMENT    . TONSILLECTOMY    . TOTAL SHOULDER ARTHROPLASTY Left 11/27/2015   Corky Mull, MD  . TOTAL SHOULDER REVISION Left 03/16/2016   Procedure: TOTAL SHOULDER REVISION;  Surgeon: Corky Mull, MD;  Location: [facility] ORS;  Service: Orthopedics;  Laterality: Left;  . US ECHOCARDIOGRAPHY  10/6718   Normal systolic fxn with EF 94-70%.  Focal basal septal hypertrophy.  Mild diastolic dysfunction.  Mild MR.   Family History  Problem Relation Age of  Onset  . Diabetes Brother        post-op  . Coronary artery disease Brother   . Diabetes Paternal Grandfather   . Breast cancer Cousin    Social History   Socioeconomic History  . Marital status: Married    Spouse name: Not on file  . Number of children: 2  . Years of education: Not on file  . Highest education level: Not on file  Occupational History  . Occupation: Retired    Fish farm manager: RETIRED  Social Needs  . Financial resource strain: Not on file  . Food insecurity:    Worry: Not on file    Inability: Not on file  . Transportation needs:    Medical: Not on file    Non-medical: Not on file  Tobacco Use  . Smoking status: Never Smoker  . Smokeless tobacco: Never Used  Substance and Sexual Activity  . Alcohol use: No  . Drug use: No  . Sexual activity: Not on file  Lifestyle  . Physical activity:    Days per week: Not on file    Minutes per session: Not on file  . Stress: Not on file  Relationships  . Social connections:    Talks on phone: Not on file    Gets together: Not on file    Attends religious service: Not on file    Active member of club or organization: Not on file    Attends meetings of clubs or organizations: Not on file    Relationship status: Not on file  Other Topics Concern  . Not on file  Social History Narrative   Left handed   Widow. Husband ([name]) deceased 04-12-16 from dementia. She was caregiver.    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 acetaminophen (TYLENOL) 500 MG tablet Take 500-1,000 mg by mouth at bedtime. Depends on pain  . amLODipine (NORVASC) 10 MG tablet TAKE 1 TABLET BY MOUTH ONCE DAILY  . bimatoprost (LUMIGAN) 0.01 % SOLN Place 1 drop into both eyes at bedtime.  Marland Kitchen Bioflavonoid Products (VITAMIN C) CHEW Chew  1 tablet by mouth daily.  . cholecalciferol (VITAMIN D) 1000 units tablet Take 1,000 Units by mouth daily.  . clonazePAM (KLONOPIN) 0.5 MG tablet TAKE 1 TABLET BY MOUTH AT BEDTIME AS NEEDED FOR ANXIETY  . colchicine 0.6 MG tablet Take 1 tablet by mouth daily as  needed  . Diclofenac Sodium CR (VOLTAREN-XR) 100 MG 24 hr tablet Take 1 tablet (100 mg total) by mouth daily.  Marland Kitchen docusate sodium (COLACE) 100 MG capsule Take 1 capsule (100 mg total) by mouth 2 (two) times daily.  . famotidine (PEPCID) 20 MG tablet Take 1 tablet (20 mg total) by mouth daily as needed for heartburn or indigestion.  . fish oil-omega-3 fatty acids 1000 MG capsule Take 1 g by mouth daily.   . furosemide (LASIX) 20 MG tablet Take 1 tablet (20 mg total) by mouth daily as needed for edema.  Marland Kitchen HYDROcodone-acetaminophen (NORCO) 5-325 MG tablet Take 1-2 tablets by mouth every 6 (six) hours as needed for moderate pain. MAXIMUM TOTAL ACETAMINOPHEN DOSE IS 4000 MG PER DAY  . levothyroxine (SYNTHROID, LEVOTHROID) 75 MCG tablet TAKE 1 TABLET BY MOUTH ONCE DAILY  . Misc Natural Products (OSTEO BI-FLEX TRIPLE STRENGTH PO) Take 1 tablet by mouth daily. With Vit D  . potassium chloride (K-DUR) 10 MEQ tablet Take 1 tablet (10 mEq total) by mouth daily as needed (with lasix).  . Pramox-PE-Glycerin-Petrolatum (PREPARATION H) 1-0.25-14.4-15 % CREA Use as directed  . Psyllium (METAMUCIL) 30.9 % POWD daily  . sertraline (ZOLOFT) 100 MG tablet Take 1 tablet (100 mg total) by mouth daily.  . simethicone (GAS-X) 80 MG chewable tablet Chew 1 tablet (80 mg total) by mouth 2 (two) times daily.  . TURMERIC PO Take 1 tablet by mouth daily.  Marland Kitchen VITAMIN E PO Take 1 tablet by mouth daily.  . VOLTAREN 1 % GEL APPLY ONE APPLICATION TOPICALLY 3 TIMES DAILY (Patient taking differently: APPLY ONE APPLICATION TOPICALLY daily as needed for shoulder pain)  . pravastatin (PRAVACHOL) 20 MG tablet Take 1 tablet (20 mg total) by mouth daily. (Patient not taking: Reported  on 10/13/2017)  . [DISCONTINUED] levothyroxine (SYNTHROID, LEVOTHROID) 75 MCG tablet Take 1 tablet by mouth every day   No facility-administered encounter medications on file as of 10/13/2017.     Activities of Daily Living In your present state of health, do you have any difficulty performing the following activities: 10/13/2017  Hearing? Y  Vision? N  Difficulty concentrating or making decisions? N  Walking or climbing stairs? Y  Dressing or bathing? N  Doing errands, shopping? N  Preparing Food and eating ? N  Using the Toilet? N  In the past six months, have you accidently leaked urine? Y  Do you have problems with loss of bowel control? Y  Managing your Medications? N  Managing your Finances? N  Housekeeping or managing your Housekeeping? Y  Some recent data might be hidden    Patient Care Team: [name], MD as PCP - General (Family Medicine)    Assessment:   This is a routine wellness examination for Evoleht.  Hearing Screening Comments: Bilateral hearing aids Vision Screening Comments: Vision exam in 2019 with Dr. Bing Plume   Exercise Activities and Dietary recommendations Current Exercise Habits: Home exercise routine, Type of exercise: walking, Time (Minutes): 10, Frequency (Times/Week): 3, Weekly Exercise (Minutes/Week): 30, Intensity: Mild, Exercise limited by: None identified  Goals    . Patient Stated     Starting 10/13/2017, I will continue to take medications as prescribed.        Fall Risk Fall Risk  10/13/2017 10/08/2016 10/07/2015 09/26/2014 06/08/2013  Falls in the past year? Yes No No No No  Comment pt fell after losing balance - - - -  Number falls in past yr: 1 - - - -  Injury with Fall? No - - - -  Risk for fall due to : Impaired balance/gait - - - -   Depression Screen PHQ 2/9 Scores 10/13/2017 10/08/2016 10/07/2015 10/07/2015  PHQ - 2 Score 2 1 0 0  PHQ- 9 Score 11 7 - -     Cognitive Function MMSE - Mini Mental State Exam 10/13/2017 10/08/2016    Orientation to time 5 5  Orientation to Place 5 5  Registration 3 3  Attention/ Calculation 0 0  Recall 1 3  Recall-comments unable to recall 2 of 3 words -  Language- name 2 objects 0 0  Language- repeat 1 1  Language- follow 3 step command 3 3  Language- read & follow direction 0 0  Write a sentence 0 0  Copy design 0 0  Total score 18 20       PLEASE NOTE: A Mini-Cog screen was completed. Maximum score is 20. A value of 0 denotes this part of [place] MMSE was not completed or the patient failed this part of the Mini-Cog screening.   Mini-Cog Screening Orientation to Time - Max 5 pts Orientation to Place - Max 5 pts Registration - Max 3 pts Recall - Max 3 pts Language Repeat - Max 1 pts Language Follow 3 Step Command - Max 3 pts   Immunization History  Administered Date(s) Administered  . Pneumococcal Conjugate-13 06/08/2013  . Pneumococcal Polysaccharide-23 04/26/2002  . Td 04/26/1998, 10/22/2008  . Zoster 05/11/2010    Screening Tests Health Maintenance  Topic Date Due  . MAMMOGRAM  09/24/2018 (Originally 09/01/2017)  . DTaP/Tdap/Td (1 - Tdap) 10/23/2018 (Originally 10/23/2008)  . INFLUENZA VACCINE  11/24/2017  . TETANUS/TDAP  10/23/2018  . DEXA SCAN  Completed  . PNA vac Low Risk Adult  Completed       Plan:     I have personally reviewed, addressed, and noted the following in the patient's chart:  A. Medical and social history B. Use of alcohol, tobacco or illicit drugs  C. Current medications and supplements D. Functional ability and status E.  Nutritional status F.  Physical activity G. Advance directives H. List of other physicians I.  Hospitalizations, surgeries, and ER visits in previous 12 months J.  [place] to include hearing, vision, cognitive, depression L. Referrals and appointments - none  In addition, I have reviewed and discussed with patient certain preventive protocols, quality metrics, and best practice recommendations.  A written personalized care plan for preventive services as well as general preventive health recommendations were provided to patient.  See attached scanned questionnaire for additional information.   Signed,   Lindell Noe, MHA, BS, LPN Health Coach

## 2017-10-13 NOTE — Patient Instructions (Addendum)
Brooke Beasley , Thank you for taking time to come for your Medicare Wellness Visit. I appreciate your ongoing commitment to your health goals. Please review the following plan we discussed and let me know if I can assist you in the future.   These are the goals we discussed: Goals    . Patient Stated     Starting 10/13/2017, I will continue to take medications as prescribed.        This is a list of the screening recommended for you and due dates:  Health Maintenance  Topic Date Due  . Mammogram  09/24/2018*  . DTaP/Tdap/Td vaccine (1 - Tdap) 10/23/2018*  . Flu Shot  11/24/2017  . Tetanus Vaccine  10/23/2018  . DEXA scan (bone density measurement)  Completed  . Pneumonia vaccines  Completed  *Topic was postponed. The date shown is not the original due date.   Preventive Care for Adults  A healthy lifestyle and preventive care can promote health and wellness. Preventive health guidelines for adults include the following key practices.  . A routine yearly physical is a good way to check with your health care provider about your health and preventive screening. It is a chance to share any concerns and updates on your health and to receive a thorough exam.  . Visit your dentist for a routine exam and preventive care every 6 months. Brush your teeth twice a day and floss once a day. Good oral hygiene prevents tooth decay and gum disease.  . The frequency of eye exams is based on your age, health, family medical history, use  of contact lenses, and other factors. Follow your health care provider's recommendations for frequency of eye exams.  . Eat a healthy diet. Foods like vegetables, fruits, whole grains, low-fat dairy products, and lean protein foods contain the nutrients you need without too many calories. Decrease your intake of foods high in solid fats, added sugars, and salt. Eat the right amount of calories for you. Get information about a proper diet from your health care provider,  if necessary.  . Regular physical exercise is one of the most important things you can do for your health. Most adults should get at least 150 minutes of moderate-intensity exercise (any activity that increases your heart rate and causes you to sweat) each week. In addition, most adults need muscle-strengthening exercises on 2 or more days a week.  Silver Sneakers may be a benefit available to you. To determine eligibility, you may visit the website: www.silversneakers.com or contact program at 725-456-0073 Mon-Fri between 8AM-8PM.   . Maintain a healthy weight. The body mass index (BMI) is a screening tool to identify possible weight problems. It provides an estimate of body fat based on height and weight. Your health care provider can find your BMI and can help you achieve or maintain a healthy weight.   For adults 20 years and older: ? A BMI below 18.5 is considered underweight. ? A BMI of 18.5 to 24.9 is normal. ? A BMI of 25 to 29.9 is considered overweight. ? A BMI of 30 and above is considered obese.   . Maintain normal blood lipids and cholesterol levels by exercising and minimizing your intake of saturated fat. Eat a balanced diet with plenty of fruit and vegetables. Blood tests for lipids and cholesterol should begin at age 72 and be repeated every 5 years. If your lipid or cholesterol levels are high, you are over 50, or you are at high risk for  heart disease, you may need your cholesterol levels checked more frequently. Ongoing high lipid and cholesterol levels should be treated with medicines if diet and exercise are not working.  . If you smoke, find out from your health care provider how to quit. If you do not use tobacco, please do not start.  . If you choose to drink alcohol, please do not consume more than 2 drinks per day. One drink is considered to be 12 ounces (355 mL) of beer, 5 ounces (148 mL) of wine, or 1.5 ounces (44 mL) of liquor.  . If you are 75-88 years old, ask  your health care provider if you should take aspirin to prevent strokes.  . Use sunscreen. Apply sunscreen liberally and repeatedly throughout the day. You should seek shade when your shadow is shorter than you. Protect yourself by wearing long sleeves, pants, a wide-brimmed hat, and sunglasses year round, whenever you are outdoors.  . Once a month, do a whole body skin exam, using a mirror to look at the skin on your back. Tell your health care provider of new moles, moles that have irregular borders, moles that are larger than a pencil eraser, or moles that have changed in shape or color.

## 2017-10-13 NOTE — Progress Notes (Signed)
PCP notes:   Health maintenance:  No gaps identified.   Abnormal screenings:   Fall risk - hx of single fall Fall Risk  10/13/2017 10/08/2016 10/07/2015 09/26/2014 06/08/2013  Falls in the past year? Yes No No No No  Comment pt fell after losing balance - - - -  Number falls in past yr: 1 - - - -  Injury with Fall? No - - - -  Risk for fall due to : Impaired balance/gait - - - -   Depression score: 11 Depression screen [facility] 2/9 10/13/2017 10/08/2016 10/07/2015 10/07/2015 09/26/2014  Decreased Interest 1 1 - 0 0  Down, Depressed, Hopeless 1 0 0 - 1  PHQ - 2 Score 2 1 0 0 1  Altered sleeping 3 3 - - -  Tired, decreased energy 3 1 - - -  Change in appetite 0 0 - - -  Feeling bad or failure about yourself  0 1 - - -  Trouble concentrating 0 0 - - -  Moving slowly or fidgety/restless 3 1 - - -  Suicidal thoughts 0 0 - - -  PHQ-9 Score 11 7 - - -  Difficult doing work/chores Somewhat difficult Not difficult at all - - -   Mini-Cog score: 18/20 MMSE - Mini Mental State Exam 10/13/2017 10/08/2016  Orientation to time 5 5  Orientation to Place 5 5  Registration 3 3  Attention/ Calculation 0 0  Recall 1 3  Recall-comments unable to recall 2 of 3 words -  Language- name 2 objects 0 0  Language- repeat 1 1  Language- follow 3 step command 3 3  Language- read & follow direction 0 0  Write a sentence 0 0  Copy design 0 0  Total score 18 20    Patient concerns:   Patient reports concerns with chronic fatigue and depression.   Nurse concerns:  None  Next PCP appt:   10/17/2017 @ 0930

## 2017-10-17 ENCOUNTER — Ambulatory Visit (INDEPENDENT_AMBULATORY_CARE_PROVIDER_SITE_OTHER): Payer: PPO | Admitting: Family Medicine

## 2017-10-17 ENCOUNTER — Encounter: Payer: Self-pay | Admitting: Family Medicine

## 2017-10-17 VITALS — BP 136/72 | HR 73 | Temp 97.9°F | Ht 62.0 in | Wt 167.8 lb

## 2017-10-17 DIAGNOSIS — R600 Localized edema: Secondary | ICD-10-CM | POA: Diagnosis not present

## 2017-10-17 DIAGNOSIS — M1120 Other chondrocalcinosis, unspecified site: Secondary | ICD-10-CM

## 2017-10-17 DIAGNOSIS — K5909 Other constipation: Secondary | ICD-10-CM

## 2017-10-17 DIAGNOSIS — E039 Hypothyroidism, unspecified: Secondary | ICD-10-CM | POA: Diagnosis not present

## 2017-10-17 DIAGNOSIS — R109 Unspecified abdominal pain: Secondary | ICD-10-CM

## 2017-10-17 DIAGNOSIS — K581 Irritable bowel syndrome with constipation: Secondary | ICD-10-CM

## 2017-10-17 DIAGNOSIS — K648 Other hemorrhoids: Secondary | ICD-10-CM

## 2017-10-17 DIAGNOSIS — N183 Chronic kidney disease, stage 3 unspecified: Secondary | ICD-10-CM

## 2017-10-17 DIAGNOSIS — E785 Hyperlipidemia, unspecified: Secondary | ICD-10-CM | POA: Diagnosis not present

## 2017-10-17 DIAGNOSIS — R1030 Lower abdominal pain, unspecified: Secondary | ICD-10-CM

## 2017-10-17 DIAGNOSIS — R7303 Prediabetes: Secondary | ICD-10-CM

## 2017-10-17 DIAGNOSIS — I10 Essential (primary) hypertension: Secondary | ICD-10-CM

## 2017-10-17 DIAGNOSIS — M150 Primary generalized (osteo)arthritis: Secondary | ICD-10-CM

## 2017-10-17 DIAGNOSIS — Z96612 Presence of left artificial shoulder joint: Secondary | ICD-10-CM

## 2017-10-17 DIAGNOSIS — Z0000 Encounter for general adult medical examination without abnormal findings: Secondary | ICD-10-CM | POA: Diagnosis not present

## 2017-10-17 DIAGNOSIS — F418 Other specified anxiety disorders: Secondary | ICD-10-CM

## 2017-10-17 DIAGNOSIS — M810 Age-related osteoporosis without current pathological fracture: Secondary | ICD-10-CM | POA: Diagnosis not present

## 2017-10-17 DIAGNOSIS — M159 Polyosteoarthritis, unspecified: Secondary | ICD-10-CM

## 2017-10-17 LAB — POC URINALSYSI DIPSTICK (AUTOMATED)
BILIRUBIN UA: NEGATIVE
Glucose, UA: NEGATIVE
LEUKOCYTES UA: NEGATIVE
Nitrite, UA: NEGATIVE
PH UA: 6 (ref 5.0–8.0)
Protein, UA: NEGATIVE
RBC UA: NEGATIVE
SPEC GRAV UA: 1.015 (ref 1.010–1.025)
Urobilinogen, UA: 0.2 E.U./dL

## 2017-10-17 MED ORDER — LEVOTHYROXINE SODIUM 88 MCG PO TABS: 88.0000 ug | ORAL_TABLET | Freq: Every day | ORAL | 6 refills | Status: DC

## 2017-10-17 MED ORDER — AMLODIPINE BESYLATE 10 MG PO TABS: 10.0000 mg | ORAL_TABLET | Freq: Every day | ORAL | 3 refills | Status: DC

## 2017-10-17 NOTE — Progress Notes (Signed)
BP 136/72 (BP Location: Left Arm, Patient Position: Sitting, Cuff Size: Normal)   Pulse 73   Temp 97.9 F (36.6 C) (Oral)   Ht 5\' 2"  (1.575 m)   Wt 167 lb 12 oz (76.1 kg)   SpO2 96%   BMI 30.68 kg/m    CC: CPE Subjective:    Patient ID: Brooke Beasley, female    DOB: [birth year], 84 y.o.   MRN: [id number]  HPI: Brooke Beasley is a 84 y.o. female presenting on 10/17/2017 for Annual Exam (Pt 2.)   Saw Katha Cabal last week for medicare wellness visit. Note reviewed. Increased fatigue and depression, PHQ9 = 11.   Notes some leg swelling worse at night time.   Chronic IBS predominant constipation. Now having more trouble with hemorrhoids, prior hemorrhoid surgery 2008. CT colonography 07/2016 did not show polyp/mass/stricture. CT scan 03/2016 also reassuring. Sees [facility] - she was referred to [facility] but cancelled appointment because she did not want to see a PA. Linzess was not of much benefit. She had normal anal manometry 11/2016 ([facility]), recommended pelvic floor PT and biofeedback for chronic constipation with IBS (Shin-Yiing Aurea Graff) - this didn't really help. Previous treatment for SIBO without benefit. Current bowel regimen is metamucil daily, powder pudding (prunes with juice and bran flakes, cool whip, apple sauce), miralax PRN constipation. Did not start lactulose - cannot tolerate this.   Some ongoing R flank pain. No blood in urine or urine changes.  Preventative: Colonoscopy 2004 - severe sigmoid diverticulosis. No recent blood in stool. Taking probiotics for IBS. Barium enema Date: 2012 severe diverticulosis, redundant colon. CT colonography was ok 2018.  DEXA - 2011 osteoporosis. Pt does not take calcium (causes constipation) but does take vit D. Declines rpt DEXA. Fosamax caused bone pain. She finds she's lost 4 inches height over the years.  Well woman - last done about 12 yrs ago. Aged out  Mammogram 08/2016 - WNL Flu shot - declines Pneumovax - 2004. prevnar  05/2013.  Td 2010 zostavax 2012 shingrix - discussed.  Advanced directives - scanned into chart 10/2015. Daughter Antoine Primas then Wyline Mood are HCPOA. Does not want life sustaining measures if terminally ill.  Seat belt use discussed Sunscreen use discussed. No changing moles on skin.  Non smoker Alcohol - none Dentist has dentures Eye exam - Q6 mo - glaucoma  Left handed Widow. Husband ([name]) deceased 04/04/16 from dementia. She was caregiver.  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 Interim medical history since our last visit reviewed. Allergies and medications reviewed and updated. Outpatient Medications Prior to Visit  Medication Sig Dispense Refill  . acetaminophen (TYLENOL) 500 MG tablet Take 500-1,000 mg by mouth at bedtime. Depends on pain    . bimatoprost (LUMIGAN) 0.01 % SOLN Place 1 drop into both eyes at bedtime.    Marland Kitchen Bioflavonoid Products (VITAMIN C) CHEW Chew 1 tablet by mouth daily.    . cholecalciferol (VITAMIN D) 1000 units tablet Take 1,000 Units by mouth daily.    . clonazePAM (KLONOPIN) 0.5 MG tablet TAKE 1 TABLET BY MOUTH AT BEDTIME AS NEEDED FOR ANXIETY 30 tablet 3  . colchicine 0.6 MG tablet Take 1 tablet by mouth daily as needed 90 tablet 0  . Diclofenac Sodium CR (VOLTAREN-XR) 100  MG 24 hr tablet Take 1 tablet (100 mg total) by mouth daily. 10 tablet 0  . docusate sodium (COLACE) 100 MG capsule Take 1 capsule (100 mg total) by mouth 2 (two) times daily.  0  . famotidine (PEPCID) 20 MG tablet Take 1 tablet (20 mg total) by mouth daily as needed for heartburn or indigestion.    . fish oil-omega-3 fatty acids 1000 MG capsule Take 1 g by mouth daily.     . furosemide (LASIX) 20 MG tablet Take 1 tablet (20 mg total) by mouth  daily as needed for edema. 30 tablet 3  . HYDROcodone-acetaminophen (NORCO) 5-325 MG tablet Take 1-2 tablets by mouth every 6 (six) hours as needed for moderate pain. MAXIMUM TOTAL ACETAMINOPHEN DOSE IS 4000 MG PER DAY 50 tablet 0  . Misc Natural Products (OSTEO BI-FLEX TRIPLE STRENGTH PO) Take 1 tablet by mouth daily. With Vit D    . potassium chloride (K-DUR) 10 MEQ tablet Take 1 tablet (10 mEq total) by mouth daily as needed (with lasix). 30 tablet 3  . Pramox-PE-Glycerin-Petrolatum (PREPARATION H) 1-0.25-14.4-15 % CREA Use as directed 26 g 0  . pravastatin (PRAVACHOL) 20 MG tablet Take 1 tablet (20 mg total) by mouth daily. 90 tablet 3  . Psyllium (METAMUCIL) 30.9 % POWD daily    . sertraline (ZOLOFT) 100 MG tablet Take 1 tablet (100 mg total) by mouth daily. 90 tablet 3  . simethicone (GAS-X) 80 MG chewable tablet Chew 1 tablet (80 mg total) by mouth 2 (two) times daily.    . TURMERIC PO Take 1 tablet by mouth daily.    Marland Kitchen VITAMIN E PO Take 1 tablet by mouth daily.    . VOLTAREN 1 % GEL APPLY ONE APPLICATION TOPICALLY 3 TIMES DAILY (Patient taking differently: APPLY ONE APPLICATION TOPICALLY daily as needed for shoulder pain) 100 g 3  . amLODipine (NORVASC) 10 MG tablet TAKE 1 TABLET BY MOUTH ONCE DAILY 90 tablet 1  . levothyroxine (SYNTHROID, LEVOTHROID) 75 MCG tablet TAKE 1 TABLET BY MOUTH ONCE DAILY 90 tablet 0   No facility-administered medications prior to visit.      Per HPI unless specifically indicated in ROS section below Review of Systems  Constitutional: Negative for activity change, appetite change, chills, fatigue, fever and unexpected weight change.  HENT: Negative for hearing loss.   Eyes: Negative for visual disturbance.  Respiratory: Positive for cough (mild) and shortness of breath. Negative for chest tightness and wheezing.   Cardiovascular: Negative for chest pain, palpitations and leg swelling.  Gastrointestinal: Positive for abdominal pain, constipation and  diarrhea. Negative for abdominal distention, blood in stool, nausea and vomiting.       "terrible hemorrhoids" with each bowel movement  Endocrine: Positive for cold intolerance and heat intolerance.  Genitourinary: Negative for difficulty urinating and hematuria.  Musculoskeletal: Negative for arthralgias, myalgias and neck pain.  Skin: Negative for rash.  Neurological: Positive for dizziness. Negative for seizures, syncope and headaches.  Hematological: Negative for adenopathy. Does not bruise/bleed easily.  Psychiatric/Behavioral: Positive for dysphoric mood. The patient is nervous/anxious.        Objective:    BP 136/72 (BP Location: Left Arm, Patient Position: Sitting, Cuff Size: Normal)   Pulse 73   Temp 97.9 F (36.6 C) (Oral)   Ht 5\' 2"  (1.575 m)   Wt 167 lb 12 oz (76.1 kg)   SpO2 96%   BMI 30.68 kg/m   Wt Readings from Last 3 Encounters:  10/17/17 167 lb  12 oz (76.1 kg)  10/13/17 163 lb 8 oz (74.2 kg)  05/26/17 168 lb (76.2 kg)    Physical Exam  Constitutional: She is oriented to person, place, and time. She appears well-developed and well-nourished. No distress.  HENT:  Head: Normocephalic and atraumatic.  Right Ear: Hearing, tympanic membrane, external ear and ear canal normal.  Left Ear: Hearing, tympanic membrane, external ear and ear canal normal.  Nose: Nose normal.  Mouth/Throat: Uvula is midline, oropharynx is clear and moist and mucous membranes are normal. No oropharyngeal exudate, posterior oropharyngeal edema or posterior oropharyngeal erythema.  Eyes: Pupils are equal, round, and reactive to light. Conjunctivae and EOM are normal. No scleral icterus.  Neck: Normal range of motion. Neck supple. Carotid bruit is not present. No thyromegaly present.  Cardiovascular: Normal rate, regular rhythm, normal heart sounds and intact distal pulses.  No murmur heard. Pulses:      Radial pulses are 2+ on the right side, and 2+ on the left side.  Pulmonary/Chest:  Effort normal and breath sounds normal. No respiratory distress. She has no wheezes. She has no rales.  Abdominal: Soft. Bowel sounds are normal. She exhibits no distension and no mass. There is no tenderness. There is no rebound and no guarding.  Genitourinary: Rectal exam shows no external hemorrhoid, no fissure, no mass and no tenderness.  Genitourinary Comments: No obvious external hemorrhoids on exam.  Musculoskeletal: Normal range of motion. She exhibits no edema.  Lymphadenopathy:    She has no cervical adenopathy.  Neurological: She is alert and oriented to person, place, and time.  CN grossly intact, station and gait intact  Skin: Skin is warm and dry. No rash noted.  Psychiatric: She has a normal mood and affect. Her behavior is normal. Judgment and thought content normal.  Nursing note and vitals reviewed.  Results for orders placed or performed in visit on 10/17/17  POCT Urinalysis Dipstick (Automated)  Result Value Ref Range   Color, UA gold    Clarity, UA clear    Glucose, UA Negative Negative   Bilirubin, UA negative    Ketones, UA trace    Spec Grav, UA 1.015 1.010 - 1.025   Blood, UA negative    pH, UA 6.0 5.0 - 8.0   Protein, UA Negative Negative   Urobilinogen, UA 0.2 0.2 or 1.0 E.U./dL   Nitrite, UA negative    Leukocytes, UA Negative Negative      Assessment & Plan:   Problem List Items Addressed This Visit    Status post total shoulder replacement    Had replacement and revision surgery.  Ongoing limited ROM, ongoing shoulder pain.       Right flank pain    Check UA r/o UTI.       Relevant Orders   POCT Urinalysis Dipstick (Automated) (Completed)   Pseudogout    On daily colchicine.       Prediabetes    Encouraged avoiding added sugars in diet.       Pedal edema    Minimal, rec only PRN lasix.       Osteoporosis    Not interested in rpt DEXA or medications.  Reviewed goal calcium /vit D - wants to get most of her calcium in the diet as  tablet supplements cause worsening constipation.       Osteoarthritis   Lower abdominal pain    Chronic, ongoing, has had prior unrevealing GI eval including CTs, CT colonography 07/2016, anal manometry and PFPT. See  below.       Irritable bowel syndrome with constipation    Predominant constipation, but alternates this with diarrhea. Persistent trouble with this. Has seen Dr Cristina Gong and Dr Gustavo Lah, has had extensive eval including normal anal manometry, and pelvic floor PT with biofeedback. Interested in [place] eval - see below.       Internal hemorrhoids    Anticipate related to this - initally recommended hydrocortisone suppository - however in prednisone allergy (anaphylaxis) do not recommend this. Will suggest she continue sitz baths.       Relevant Medications   amLODipine (NORVASC) 10 MG tablet   Hypothyroidism    TSH was elevated today. No recent change to generic med formulation. This could contribute to constipation. Recommended increase levothyroxine to 73mcg daily - new dose sent. Monitor closely as she did not tolerate prior 27mcg trial. Trail of this for 2 months, and if GI symptoms no better, will refer to [facility].       Relevant Medications   levothyroxine (SYNTHROID, LEVOTHROID) 88 MCG tablet   HYPERTENSION, BENIGN    Chronic, stable. Continue current regimen.       Relevant Medications   amLODipine (NORVASC) 10 MG tablet   HLD (hyperlipidemia)    Chronic, uncontrolled. Pt had stopped pravastatin due to knee stiffness. Will stay off this for now.  The ASCVD Risk score Mikey Bussing DC Jr., et al., 2013) failed to calculate for the following reasons:   The 2013 ASCVD risk score is only valid for ages 41 to 80       Relevant Medications   amLODipine (NORVASC) 10 MG tablet   Health maintenance examination - Primary    Preventative protocols reviewed and updated unless pt declined. Discussed healthy diet and lifestyle.       CKD (chronic kidney disease) stage 3, GFR 30-59  ml/min (HCC)    Stable period.       Chronic constipation    Current bowel regimen is metamucil daily, powder pudding (prunes with juice and bran flakes, cool whip, apple sauce), miralax PRN constipation.       Anxiety associated with depression    Chronic on sertraline and klonopin - continue.           Meds ordered this encounter  Medications  . amLODipine (NORVASC) 10 MG tablet    Sig: Take 1 tablet (10 mg total) by mouth daily.    Dispense:  90 tablet    Refill:  3  . levothyroxine (SYNTHROID, LEVOTHROID) 88 MCG tablet    Sig: Take 1 tablet (88 mcg total) by mouth daily.    Dispense:  30 tablet    Refill:  6    Note new dose   Orders Placed This Encounter  Procedures  . POCT Urinalysis Dipstick (Automated)    Follow up plan: Return in about 2 months (around 12/17/2017) for follow up visit.  [name], MD

## 2017-10-17 NOTE — Patient Instructions (Addendum)
Make sure to get 1218m calcium daily from the diet as you're not taking calcium tablets. Every glass of milk or calcium fortified orange juice has 3072m Drink this, eat dairy products like yogurt and cheese daily if you can tolerate this. Schedule mammogram every 1-2 years If interested, check with pharmacy about new 2 shot shingles series (shingrix).  Increase thryoid medicine to [id number] - next highest dose as your thyroid levels were too low. Return in 2 months for lab visit to recheck thyroid levels.  Try steroid suppositories for internal hemorrhoids.  Try above for 2 months and if bowels no better [facility]  Return in 2 months for follow up visit with labs (recheck thyroid)  Health Maintenance, Female Adopting a healthy lifestyle and getting preventive care can go a long way to promote health and wellness. Talk with your health care provider about what schedule of regular examinations is right for you. This is a good chance for you to check in with your provider about disease prevention and staying healthy. In between checkups, there are plenty of things you can do on your own. Experts have done a lot of research about which lifestyle changes and preventive measures are most likely to keep you healthy. Ask your health care provider for more information. Weight and diet Eat a healthy diet  Be sure to include plenty of vegetables, fruits, low-fat dairy products, and lean protein.  Do not eat a lot of foods high in solid fats, added sugars, or salt.  Get regular exercise. This is one of the most important things you can do for your health. ? Most adults should exercise for at least 150 minutes each week. The exercise should increase your heart rate and make you sweat (moderate-intensity exercise). ? Most adults should also do strengthening exercises at least twice a week. This is in addition to the moderate-intensity exercise.  Maintain a healthy  weight  Body mass index (BMI) is a measurement that can be used to identify possible weight problems. It estimates body fat based on height and weight. Your health care provider can help determine your BMI and help you achieve or maintain a healthy weight.  For females 20 27ars of age and older: ? A BMI below 18.5 is considered underweight. ? A BMI of 18.5 to 24.9 is normal. ? A BMI of 25 to 29.9 is considered overweight. ? A BMI of 30 and above is considered obese.  Watch levels of cholesterol and blood lipids  You should start having your blood tested for lipids and cholesterol at 20 58ars of age, then have this test every 5 years.  You may need to have your cholesterol levels checked more often if: ? Your lipid or cholesterol levels are high. ? You are older than 50 32ars of age. ? You are at high risk for heart disease.  Cancer screening Lung Cancer  Lung cancer screening is recommended for adults 55-18 17ars old who are at high risk for lung cancer because of a history of smoking.  A yearly low-dose CT scan of the lungs is recommended for people who: ? Currently smoke. ? Have quit within the past 15 years. ? Have at least a 30-pack-year history of smoking. A pack year is smoking an average of one pack of cigarettes a day for 1 year.  Yearly screening should continue until it has been 15 years since you quit.  Yearly screening should stop if you  develop a health problem that would prevent you from having lung cancer treatment.  Breast Cancer  Practice breast self-awareness. This means understanding how your breasts normally appear and feel.  It also means doing regular breast self-exams. Let your health care provider know about any changes, no matter how small.  If you are in your 20s or 30s, you should have a clinical breast exam (CBE) by a health care provider every 1-3 years as part of a regular health exam.  If you are 58 or older, have a CBE every year. Also consider  having a breast X-ray (mammogram) every year.  If you have a family history of breast cancer, talk to your health care provider about genetic screening.  If you are at high risk for breast cancer, talk to your health care provider about having an MRI and a mammogram every year.  Breast cancer gene (BRCA) assessment is recommended for women who have family members with BRCA-related cancers. BRCA-related cancers include: ? Breast. ? Ovarian. ? Tubal. ? Peritoneal cancers.  Results of the assessment will determine the need for genetic counseling and BRCA1 and BRCA2 testing.  Cervical Cancer Your health care provider may recommend that you be screened regularly for cancer of the pelvic organs (ovaries, uterus, and vagina). This screening involves a pelvic examination, including checking for microscopic changes to the surface of your cervix (Pap test). You may be encouraged to have this screening done every 3 years, beginning at age 75.  For women ages 2-65, health care providers may recommend pelvic exams and Pap testing every 3 years, or they may recommend the Pap and pelvic exam, combined with testing for human papilloma virus (HPV), every 5 years. Some types of HPV increase your risk of cervical cancer. Testing for HPV may also be done on women of any age with unclear Pap test results.  Other health care providers may not recommend any screening for nonpregnant women who are considered low risk for pelvic cancer and who do not have symptoms. Ask your health care provider if a screening pelvic exam is right for you.  If you have had past treatment for cervical cancer or a condition that could lead to cancer, you need Pap tests and screening for cancer for at least 20 years after your treatment. If Pap tests have been discontinued, your risk factors (such as having a new sexual partner) need to be reassessed to determine if screening should resume. Some women have medical problems that increase  the chance of getting cervical cancer. In these cases, your health care provider may recommend more frequent screening and Pap tests.  Colorectal Cancer  This type of cancer can be detected and often prevented.  Routine colorectal cancer screening usually begins at 82 years of age and continues through 82 years of age.  Your health care provider may recommend screening at an earlier age if you have risk factors for colon cancer.  Your health care provider may also recommend using home test kits to check for hidden blood in the stool.  A small camera at the end of a tube can be used to examine your colon directly (sigmoidoscopy or colonoscopy). This is done to check for the earliest forms of colorectal cancer.  Routine screening usually begins at age 13.  Direct examination of the colon should be repeated every 5-10 years through 82 years of age. However, you may need to be screened more often if early forms of precancerous polyps or small growths are found.  Skin Cancer  Check your skin from head to toe regularly.  Tell your health care provider about any new moles or changes in moles, especially if there is a change in a mole's shape or color.  Also tell your health care provider if you have a mole that is larger than the size of a pencil eraser.  Always use sunscreen. Apply sunscreen liberally and repeatedly throughout the day.  Protect yourself by wearing long sleeves, pants, a wide-brimmed hat, and sunglasses whenever you are outside.  Heart disease, diabetes, and high blood pressure  High blood pressure causes heart disease and increases the risk of stroke. High blood pressure is more likely to develop in: ? People who have blood pressure in the high end of the normal range (130-139/85-89 mm Hg). ? People who are overweight or obese. ? People who are African American.  If you are 58-54 years of age, have your blood pressure checked every 3-5 years. If you are 56 years of age  or older, have your blood pressure checked every year. You should have your blood pressure measured twice-once when you are at a hospital or clinic, and once when you are not at a hospital or clinic. Record the average of the two measurements. To check your blood pressure when you are not at a hospital or clinic, you can use: ? An automated blood pressure machine at a pharmacy. ? A home blood pressure monitor.  If you are between 72 years and 60 years old, ask your health care provider if you should take aspirin to prevent strokes.  Have regular diabetes screenings. This involves taking a blood sample to check your fasting blood sugar level. ? If you are at a normal weight and have a low risk for diabetes, have this test once every three years after 82 years of age. ? If you are overweight and have a high risk for diabetes, consider being tested at a younger age or more often. Preventing infection Hepatitis B  If you have a higher risk for hepatitis B, you should be screened for this virus. You are considered at high risk for hepatitis B if: ? You were born in a country where hepatitis B is common. Ask your health care provider which countries are considered high risk. ? Your parents were born in a high-risk country, and you have not been immunized against hepatitis B (hepatitis B vaccine). ? You have HIV or AIDS. ? You use needles to inject street drugs. ? You live with someone who has hepatitis B. ? You have had sex with someone who has hepatitis B. ? You get hemodialysis treatment. ? You take certain medicines for conditions, including cancer, organ transplantation, and autoimmune conditions.  Hepatitis C  Blood testing is recommended for: ? Everyone born from [birth year] through 1963. ? Anyone with known risk factors for hepatitis C.  Sexually transmitted infections (STIs)  You should be screened for sexually transmitted infections (STIs) including gonorrhea and chlamydia if: ? You are  sexually active and are younger than 82 years of age. ? You are older than 82 years of age and your health care provider tells you that you are at risk for this type of infection. ? Your sexual activity has changed since you were last screened and you are at an increased risk for chlamydia or gonorrhea. Ask your health care provider if you are at risk.  If you do not have HIV, but are at risk, it may be recommended that you  take a prescription medicine daily to prevent HIV infection. This is called pre-exposure prophylaxis (PrEP). You are considered at risk if: ? You are sexually active and do not regularly use condoms or know the HIV status of your partner(s). ? You take drugs by injection. ? You are sexually active with a partner who has HIV.  Talk with your health care provider about whether you are at high risk of being infected with HIV. If you choose to begin PrEP, you should first be tested for HIV. You should then be tested every 3 months for as long as you are taking PrEP. Pregnancy  If you are premenopausal and you may become pregnant, ask your health care provider about preconception counseling.  If you may become pregnant, take 400 to 800 micrograms (mcg) of folic acid every day.  If you want to prevent pregnancy, talk to your health care provider about birth control (contraception). Osteoporosis and menopause  Osteoporosis is a disease in which the bones lose minerals and strength with aging. This can result in serious bone fractures. Your risk for osteoporosis can be identified using a bone density scan.  If you are 53 years of age or older, or if you are at risk for osteoporosis and fractures, ask your health care provider if you should be screened.  Ask your health care provider whether you should take a calcium or vitamin D supplement to lower your risk for osteoporosis.  Menopause may have certain physical symptoms and risks.  Hormone replacement therapy may reduce some  of these symptoms and risks. Talk to your health care provider about whether hormone replacement therapy is right for you. Follow these instructions at home:  Schedule regular health, dental, and eye exams.  Stay current with your immunizations.  Do not use any tobacco products including cigarettes, chewing tobacco, or electronic cigarettes.  If you are pregnant, do not drink alcohol.  If you are breastfeeding, limit how much and how often you drink alcohol.  Limit alcohol intake to no more than 1 drink per day for nonpregnant women. One drink equals 12 ounces of beer, 5 ounces of wine, or 1 ounces of hard liquor.  Do not use street drugs.  Do not share needles.  Ask your health care provider for help if you need support or information about quitting drugs.  Tell your health care provider if you often feel depressed.  Tell your health care provider if you have ever been abused or do not feel safe at home. This information is not intended to replace advice given to you by your health care provider. Make sure you discuss any questions you have with your health care provider. Document Released: 10/26/2010 Document Revised: 09/18/2015 Document Reviewed: 01/14/2015 Elsevier Interactive Patient Education  [employer].

## 2017-10-17 NOTE — Assessment & Plan Note (Signed)
Encouraged avoiding added sugars in diet.  

## 2017-10-17 NOTE — Assessment & Plan Note (Signed)
Minimal, rec only PRN lasix.

## 2017-10-17 NOTE — Progress Notes (Signed)
I reviewed health advisor's note, was available for consultation, and agree with documentation and plan.  

## 2017-10-17 NOTE — Assessment & Plan Note (Signed)
Not interested in rpt DEXA or medications.  Reviewed goal calcium /vit D - wants to get most of her calcium in the diet as tablet supplements cause worsening constipation.

## 2017-10-17 NOTE — Assessment & Plan Note (Addendum)
Predominant constipation, but alternates this with diarrhea. Persistent trouble with this. Has seen Dr Cristina Gong and Dr Gustavo Lah, has had extensive eval including normal anal manometry, and pelvic floor PT with biofeedback. Interested in [place] eval - see below.

## 2017-10-17 NOTE — Assessment & Plan Note (Signed)
Preventative protocols reviewed and updated unless pt declined. Discussed healthy diet and lifestyle.  

## 2017-10-19 ENCOUNTER — Encounter: Payer: Self-pay | Admitting: Family Medicine

## 2017-10-20 DIAGNOSIS — R109 Unspecified abdominal pain: Secondary | ICD-10-CM | POA: Insufficient documentation

## 2017-10-20 DIAGNOSIS — R10A1 Flank pain, right side: Secondary | ICD-10-CM | POA: Insufficient documentation

## 2017-10-20 MED ORDER — HYDROCORTISONE ACETATE 25 MG RE SUPP: 25.0000 mg | Freq: Two times a day (BID) | RECTAL | 0 refills | Status: DC

## 2017-10-20 NOTE — Assessment & Plan Note (Addendum)
Chronic, ongoing, has had prior unrevealing GI eval including CTs, CT colonography 07/2016, anal manometry and PFPT. See below.

## 2017-10-20 NOTE — Assessment & Plan Note (Signed)
On daily colchicine.

## 2017-10-20 NOTE — Assessment & Plan Note (Signed)
Current bowel regimen is metamucil daily, powder pudding (prunes with juice and bran flakes, cool whip, apple sauce), miralax PRN constipation.

## 2017-10-20 NOTE — Addendum Note (Signed)
Addended by: [name] on: 10/20/2017 01:48 PM   Modules accepted: Orders

## 2017-10-20 NOTE — Assessment & Plan Note (Signed)
Stable period.  

## 2017-10-20 NOTE — Assessment & Plan Note (Signed)
Chronic, stable. Continue current regimen. 

## 2017-10-20 NOTE — Assessment & Plan Note (Signed)
Anticipate related to this - initally recommended hydrocortisone suppository - however in prednisone allergy (anaphylaxis) do not recommend this. Will suggest she continue sitz baths.

## 2017-10-20 NOTE — Assessment & Plan Note (Addendum)
TSH was elevated today. No recent change to generic med formulation. This could contribute to constipation. Recommended increase levothyroxine to 48mcg daily - new dose sent. Monitor closely as she did not tolerate prior 35mcg trial. Trail of this for 2 months, and if GI symptoms no better, will refer to [facility].

## 2017-10-20 NOTE — Assessment & Plan Note (Signed)
Chronic on sertraline and klonopin - continue.

## 2017-10-20 NOTE — Assessment & Plan Note (Signed)
Had replacement and revision surgery.  Ongoing limited ROM, ongoing shoulder pain.

## 2017-10-20 NOTE — Assessment & Plan Note (Signed)
Chronic, uncontrolled. Pt had stopped pravastatin due to knee stiffness. Will stay off this for now.  The ASCVD Risk score Brooke Beasley DC Jr., et al., 2013) failed to calculate for the following reasons:   The 2013 ASCVD risk score is only valid for ages 78 to 44

## 2017-10-20 NOTE — Assessment & Plan Note (Addendum)
Check UA r/o UTI.

## 2017-10-20 NOTE — Assessment & Plan Note (Deleted)
Advanced directives - scanned into chart 10/2015. Daughter Rebecca Newlin then Janet Moore are HCPOA. Does not want life sustaining measures if terminally ill.  

## 2017-10-25 ENCOUNTER — Telehealth: Payer: Self-pay

## 2017-10-25 NOTE — Telephone Encounter (Signed)
Received faxed PA form for hydrocortisone 25 mg suppository.   Placed form in Dr. Synthia Innocent box.

## 2017-10-25 NOTE — Telephone Encounter (Signed)
Erica from [place] wanted to know if coverage determination for Hydro cortisone supp was received? Lattie Haw CMA said no and Danae Chen will resend to [phone]. FYI to [place].

## 2017-10-25 NOTE — Telephone Encounter (Signed)
Filled and in Lisa's box 

## 2017-10-26 NOTE — Telephone Encounter (Signed)
Received faxed PA denial stating the medication is a Benefit Exclusion and that the requested medication is considered to be less-than-effective.

## 2017-10-27 ENCOUNTER — Other Ambulatory Visit: Payer: Self-pay | Admitting: Family Medicine

## 2017-10-28 ENCOUNTER — Encounter: Payer: Self-pay | Admitting: Family Medicine

## 2017-10-28 MED ORDER — HYDROCORTISONE ACETATE 25 MG RE SUPP: 25.0000 mg | Freq: Two times a day (BID) | RECTAL | 2 refills | Status: DC

## 2017-11-06 ENCOUNTER — Other Ambulatory Visit: Payer: Self-pay | Admitting: Family Medicine

## 2017-11-06 NOTE — Telephone Encounter (Signed)
Eprescribed.

## 2017-11-22 ENCOUNTER — Encounter: Payer: Self-pay | Admitting: Family Medicine

## 2017-12-18 ENCOUNTER — Other Ambulatory Visit: Payer: Self-pay | Admitting: Family Medicine

## 2017-12-18 DIAGNOSIS — N183 Chronic kidney disease, stage 3 unspecified: Secondary | ICD-10-CM

## 2017-12-18 DIAGNOSIS — E039 Hypothyroidism, unspecified: Secondary | ICD-10-CM

## 2017-12-19 ENCOUNTER — Other Ambulatory Visit (INDEPENDENT_AMBULATORY_CARE_PROVIDER_SITE_OTHER): Payer: PPO

## 2017-12-19 DIAGNOSIS — N183 Chronic kidney disease, stage 3 unspecified: Secondary | ICD-10-CM

## 2017-12-19 DIAGNOSIS — E039 Hypothyroidism, unspecified: Secondary | ICD-10-CM

## 2017-12-19 LAB — RENAL FUNCTION PANEL
ALBUMIN: 4.2 g/dL (ref 3.5–5.2)
BUN: 15 mg/dL (ref 6–23)
CALCIUM: 9.5 mg/dL (ref 8.4–10.5)
CO2: 29 mEq/L (ref 19–32)
CREATININE: 1.09 mg/dL (ref 0.40–1.20)
Chloride: 98 mEq/L (ref 96–112)
GFR: 50.7 mL/min — ABNORMAL LOW (ref >60.00)
Glucose, Bld: 115 mg/dL — ABNORMAL HIGH (ref 70–99)
Phosphorus: 3 mg/dL (ref 2.3–4.6)
Potassium: 3.7 mEq/L (ref 3.5–5.1)
Sodium: 135 mEq/L (ref 135–145)

## 2017-12-19 LAB — TSH: TSH: 7.71 u[IU]/mL — ABNORMAL HIGH (ref 0.35–4.50)

## 2017-12-19 LAB — T4, FREE: Free T4: 0.75 ng/dL (ref 0.60–1.60)

## 2017-12-22 ENCOUNTER — Encounter: Payer: Self-pay | Admitting: Family Medicine

## 2017-12-22 ENCOUNTER — Ambulatory Visit (INDEPENDENT_AMBULATORY_CARE_PROVIDER_SITE_OTHER): Payer: PPO | Admitting: Family Medicine

## 2017-12-22 VITALS — BP 124/70 | HR 75 | Temp 98.2°F | Ht 62.0 in | Wt 164.0 lb

## 2017-12-22 DIAGNOSIS — K581 Irritable bowel syndrome with constipation: Secondary | ICD-10-CM | POA: Diagnosis not present

## 2017-12-22 DIAGNOSIS — R1030 Lower abdominal pain, unspecified: Secondary | ICD-10-CM

## 2017-12-22 DIAGNOSIS — J189 Pneumonia, unspecified organism: Secondary | ICD-10-CM

## 2017-12-22 DIAGNOSIS — E039 Hypothyroidism, unspecified: Secondary | ICD-10-CM | POA: Diagnosis not present

## 2017-12-22 DIAGNOSIS — R05 Cough: Secondary | ICD-10-CM | POA: Diagnosis not present

## 2017-12-22 DIAGNOSIS — M810 Age-related osteoporosis without current pathological fracture: Secondary | ICD-10-CM | POA: Diagnosis not present

## 2017-12-22 DIAGNOSIS — R059 Cough, unspecified: Secondary | ICD-10-CM

## 2017-12-22 HISTORY — DX: Pneumonia, unspecified organism: J18.9

## 2017-12-22 MED ORDER — LEVOTHYROXINE SODIUM 100 MCG PO TABS: 100.0000 ug | ORAL_TABLET | Freq: Every day | ORAL | 11 refills | Status: DC

## 2017-12-22 MED ORDER — HYDROCORTISONE ACETATE 25 MG RE SUPP: 25.0000 mg | Freq: Two times a day (BID) | RECTAL | 2 refills | Status: DC

## 2017-12-22 NOTE — Patient Instructions (Addendum)
Thyroid is better but still too low. Let's increase levothyroxine to 130mcg daily. Make sure to take first thing in the morning, on an empty stomach, and separate from other medicines and vitamins by at least 1 hour.  Return in 2 months for lab visit and follow up.  Likely viral cough - should improve over next few days. May take robitussin or delsym for cough suppressant.

## 2017-12-22 NOTE — Assessment & Plan Note (Signed)
Agrees to rpt DEXA - ordered.

## 2017-12-22 NOTE — Assessment & Plan Note (Signed)
TSH improving but still high - will increase dose to 140mcg daily. She is taking thyroid replacement appropriately.

## 2017-12-22 NOTE — Assessment & Plan Note (Signed)
Anticipate viral URI. Supportive care recommended.

## 2017-12-22 NOTE — Assessment & Plan Note (Signed)
Chronic, related to constipation and presumed IBS. Has had unrevealing GI eval including CTs, CT colonography 07/2016, anal manometry and PFPT.

## 2017-12-22 NOTE — Progress Notes (Signed)
BP 124/70 (BP Location: Left Arm, Patient Position: Sitting, Cuff Size: Normal)   Pulse 75   Temp 98.2 F (36.8 C) (Oral)   Ht 5\' 2"  (1.575 m)   Wt 164 lb (74.4 kg)   SpO2 98%   BMI 30.00 kg/m    CC: 2 mo f/u thyroid Subjective:    Patient ID: Brooke Beasley, female    DOB: 1930/07/26, 84 y.o.   MRN: [id number]  HPI: Brooke Beasley is a 84 y.o. female presenting on 12/22/2017 for Hypothyroidism (Here for 2 mo f/u. Levothyroxine was increased to 88 mcg.)   See prior note for details. Chronic IBS predominant constipation and worsened hemorrhoids managed with metamucil, powder pudding, miralax PRN. linzess was not effective. She was unable to tolerate lactulose.  Hypothyroidism - last visit we increased levothyroxine to 34mcg daily - she has tolerated well. No change in constipation. Some cold intolerance. No skin or hair changes. No hyperthyroid symptoms of palpitations.   Episode earlier this week of abdominal pain associated with constipation, vomiting, treated with dulcolax with limited benefit. Ongoing trouble with hemorrhoids, steroid suppositories have helped.  Some dyspnea with exertion without chest pain. Noticing some coughing over the past week. No fevers/chills.   Relevant past medical, surgical, family and social history reviewed and updated as indicated. Interim medical history since our last visit reviewed. Allergies and medications reviewed and updated. Outpatient Medications Prior to Visit  Medication Sig Dispense Refill  . acetaminophen (TYLENOL) 500 MG tablet Take 500-1,000 mg by mouth at bedtime. Depends on pain    . amLODipine (NORVASC) 10 MG tablet Take 1 tablet (10 mg total) by mouth daily. 90 tablet 3  . bimatoprost (LUMIGAN) 0.01 % SOLN Place 1 drop into both eyes at bedtime.    Marland Kitchen Bioflavonoid Products (VITAMIN C) CHEW Chew 1 tablet by mouth daily.    . cholecalciferol (VITAMIN D) 1000 units tablet Take 1,000 Units by mouth daily.    . clonazePAM  (KLONOPIN) 0.5 MG tablet TAKE 1 TABLET BY MOUTH AT BEDTIME AS NEEDED FOR ANXIETY 30 tablet 3  . colchicine 0.6 MG tablet Take 1 tablet by mouth daily as needed 90 tablet 0  . Diclofenac Sodium CR (VOLTAREN-XR) 100 MG 24 hr tablet Take 1 tablet (100 mg total) by mouth daily. 10 tablet 0  . docusate sodium (COLACE) 100 MG capsule Take 1 capsule (100 mg total) by mouth 2 (two) times daily.  0  . famotidine (PEPCID) 20 MG tablet Take 1 tablet (20 mg total) by mouth daily as needed for heartburn or indigestion.    . fish oil-omega-3 fatty acids 1000 MG capsule Take 1 g by mouth daily.     . furosemide (LASIX) 20 MG tablet Take 1 tablet (20 mg total) by mouth daily as needed for edema. 30 tablet 3  . Misc Natural Products (OSTEO BI-FLEX TRIPLE STRENGTH PO) Take 1 tablet by mouth daily. With Vit D    . potassium chloride (K-DUR) 10 MEQ tablet Take 1 tablet (10 mEq total) by mouth daily as needed (with lasix). 30 tablet 3  . Pramox-PE-Glycerin-Petrolatum (PREPARATION H) 1-0.25-14.4-15 % CREA Use as directed 26 g 0  . Psyllium (METAMUCIL) 30.9 % POWD daily    . sertraline (ZOLOFT) 100 MG tablet Take 1 tablet (100 mg total) by mouth daily. 90 tablet 3  . simethicone (GAS-X) 80 MG chewable tablet Chew 1 tablet (80 mg total) by mouth 2 (two) times daily.    . TURMERIC PO Take  1 tablet by mouth daily.    Marland Kitchen VITAMIN E PO Take 1 tablet by mouth daily.    . VOLTAREN 1 % GEL APPLY ONE APPLICATION TOPICALLY 3 TIMES DAILY (Patient taking differently: APPLY ONE APPLICATION TOPICALLY daily as needed for shoulder pain) 100 g 3  . HYDROcodone-acetaminophen (NORCO) 5-325 MG tablet Take 1-2 tablets by mouth every 6 (six) hours as needed for moderate pain. MAXIMUM TOTAL ACETAMINOPHEN DOSE IS 4000 MG PER DAY 50 tablet 0  . hydrocortisone (ANUSOL-HC) 25 MG suppository Place 1 suppository (25 mg total) rectally 2 (two) times daily. 12 suppository 2  . levothyroxine (SYNTHROID, LEVOTHROID) 88 MCG tablet Take 1 tablet (88 mcg  total) by mouth daily. 30 tablet 6  . pravastatin (PRAVACHOL) 20 MG tablet Take 1 tablet (20 mg total) by mouth daily. (Patient not taking: Reported on 12/22/2017) 90 tablet 3   No facility-administered medications prior to visit.      Per HPI unless specifically indicated in ROS section below Review of Systems     Objective:    BP 124/70 (BP Location: Left Arm, Patient Position: Sitting, Cuff Size: Normal)   Pulse 75   Temp 98.2 F (36.8 C) (Oral)   Ht 5\' 2"  (1.575 m)   Wt 164 lb (74.4 kg)   SpO2 98%   BMI 30.00 kg/m   Wt Readings from Last 3 Encounters:  12/22/17 164 lb (74.4 kg)  10/17/17 167 lb 12 oz (76.1 kg)  10/13/17 163 lb 8 oz (74.2 kg)    Physical Exam  Constitutional: She appears well-developed. No distress.  HENT:  Mouth/Throat: Oropharynx is clear and moist. No oropharyngeal exudate.  Eyes: Pupils are equal, round, and reactive to light. EOM are normal.  Cardiovascular: Normal rate, regular rhythm and normal heart sounds.  No murmur heard. Pulmonary/Chest: Effort normal and breath sounds normal. No respiratory distress. She has no wheezes. She has no rales.  Abdominal: Soft. Bowel sounds are normal. She exhibits no distension and no mass. There is no tenderness. There is no rebound and no guarding. No hernia.  Musculoskeletal: She exhibits no edema.  Skin: No rash noted.  Psychiatric: She has a normal mood and affect.  Nursing note and vitals reviewed.  Results for orders placed or performed in visit on 12/19/17  Renal function panel  Result Value Ref Range   Sodium 135 135 - 145 mEq/L   Potassium 3.7 3.5 - 5.1 mEq/L   Chloride 98 96 - 112 mEq/L   CO2 29 19 - 32 mEq/L   Calcium 9.5 8.4 - 10.5 mg/dL   Albumin 4.2 3.5 - 5.2 g/dL   BUN 15 6 - 23 mg/dL   Creatinine, Ser 1.09 0.40 - 1.20 mg/dL   Glucose, Bld 115 (H) 70 - 99 mg/dL   Phosphorus 3.0 2.3 - 4.6 mg/dL   GFR 50.70 (L) >60.00 mL/min  T4, free  Result Value Ref Range   Free T4 0.75 0.60 - 1.60  ng/dL  TSH  Result Value Ref Range   TSH 7.71 (H) 0.35 - 4.50 uIU/mL      Assessment & Plan:   Problem List Items Addressed This Visit    Osteoporosis    Agrees to rpt DEXA - ordered.       Relevant Orders   DG Bone Density   Lower abdominal pain    Chronic, related to constipation and presumed IBS. Has had unrevealing GI eval including CTs, CT colonography 07/2016, anal manometry and PFPT.  Irritable bowel syndrome with constipation    Ongoing.       Hypothyroidism - Primary    TSH improving but still high - will increase dose to 13mcg daily. She is taking thyroid replacement appropriately.      Relevant Medications   levothyroxine (SYNTHROID, LEVOTHROID) 100 MCG tablet   Cough    Anticipate viral URI. Supportive care recommended.           Meds ordered this encounter  Medications  . DISCONTD: levothyroxine (SYNTHROID, LEVOTHROID) 100 MCG tablet    Sig: Take 1 tablet (100 mcg total) by mouth daily.    Dispense:  30 tablet    Refill:  11    Note new dose  . levothyroxine (SYNTHROID, LEVOTHROID) 100 MCG tablet    Sig: Take 1 tablet (100 mcg total) by mouth daily.    Dispense:  30 tablet    Refill:  11    Note new dose  . hydrocortisone (ANUSOL-HC) 25 MG suppository    Sig: Place 1 suppository (25 mg total) rectally 2 (two) times daily.    Dispense:  12 suppository    Refill:  2   Orders Placed This Encounter  Procedures  . DG Bone Density    Standing Status:   Future    Standing Expiration Date:   02/22/2019    Order Specific Question:   Reason for Exam (SYMPTOM  OR DIAGNOSIS REQUIRED)    Answer:   osteoporosis f/u    Order Specific Question:   Preferred imaging location?    Answer:   [facility]    Follow up plan: Return in about 2 months (around 02/21/2018) for follow up visit.  [name], MD

## 2017-12-22 NOTE — Assessment & Plan Note (Signed)
Ongoing

## 2017-12-23 ENCOUNTER — Telehealth: Payer: Self-pay

## 2017-12-23 NOTE — Telephone Encounter (Signed)
Brooke Beasley with [facility] transferred Brooke Beasley with [employer] order; Brooke Beasley said received rx for levothyroxine 100 mcg on 12/22/17 and Brooke Beasley request 90 day supply. Per pts med list levothyroxine 100 mcg was sent electronically to [address]. I spoke with pt and until gets thyroid med regulated will get 30 day supply at local pharmacy. Once med regulated pt will request to be sent to mail order pharmacy.Brooke Beasley said levothyroxine 100 mg was picked up yesterday at [facility]. Brooke Beasley voiced understanding and will cancel levothyroxine 100 mcg # 30 x 11 request on 12/22/17.

## 2017-12-27 ENCOUNTER — Telehealth: Payer: Self-pay

## 2017-12-27 NOTE — Telephone Encounter (Signed)
Sorry cough is no better. Yes plz have her return for a recheck if able.

## 2017-12-27 NOTE — Telephone Encounter (Signed)
Spoke with patient and she stated that she was suppose to call back if her cough does not get any better. Patient was in for office visit on 12/22/17. She has been taking Flonase and Robitussin since then. Cough is productive at times-with greenish phlegm, cough is present during the day and gets a little worse in the evening. Hurts to cough. No chest pain. No dyspnea, no fever or body aches. Some nasal congestion. She is sweating a lot at night., Cough is not better and can not say if it is worse or not. Please review. Patient was not sure if she needed to come back in for recheck or not-Nekayla Heider V Elijan Googe, RMA

## 2017-12-27 NOTE — Telephone Encounter (Addendum)
Spoke with pt relaying Dr. Synthia Innocent message. Pt is scheduled for Thurs, 12/29/17 at 8:00 AM.

## 2017-12-29 ENCOUNTER — Ambulatory Visit (INDEPENDENT_AMBULATORY_CARE_PROVIDER_SITE_OTHER)
Admission: RE | Admit: 2017-12-29 | Discharge: 2017-12-29 | Disposition: A | Discharge status: Home / Self Care | Payer: PPO | Source: Ambulatory Visit | Attending: Family Medicine | Admitting: Family Medicine

## 2017-12-29 ENCOUNTER — Encounter: Payer: Self-pay | Admitting: Family Medicine

## 2017-12-29 ENCOUNTER — Ambulatory Visit (INDEPENDENT_AMBULATORY_CARE_PROVIDER_SITE_OTHER): Payer: PPO | Admitting: Family Medicine

## 2017-12-29 VITALS — BP 126/84 | HR 69 | Temp 98.2°F | Ht 62.0 in | Wt 165.0 lb

## 2017-12-29 DIAGNOSIS — R05 Cough: Secondary | ICD-10-CM

## 2017-12-29 DIAGNOSIS — R059 Cough, unspecified: Secondary | ICD-10-CM

## 2017-12-29 DIAGNOSIS — J181 Lobar pneumonia, unspecified organism: Secondary | ICD-10-CM

## 2017-12-29 DIAGNOSIS — J189 Pneumonia, unspecified organism: Secondary | ICD-10-CM

## 2017-12-29 MED ORDER — BENZONATATE 100 MG PO CAPS: 100.0000 mg | ORAL_CAPSULE | Freq: Three times a day (TID) | ORAL | 0 refills | Status: DC | PRN

## 2017-12-29 MED ORDER — CEFTRIAXONE SODIUM 1 G IJ SOLR
1.0000 g | Freq: Once | INTRAMUSCULAR | Status: AC
Start: 2017-12-29 — End: 2017-12-29
  Administered 2017-12-29: 1 g via INTRAMUSCULAR

## 2017-12-29 MED ORDER — AZITHROMYCIN 250 MG PO TABS: ORAL_TABLET | ORAL | 0 refills | Status: DC

## 2017-12-29 NOTE — Assessment & Plan Note (Addendum)
Persistent and worsening- concern for bacterial cause. CXR with L pleural effusion and possible infiltrate - will await radiology read. Will treat as CAP with IM rocephin 1gm and zpack antibiotic. Further supportive care reviewed. Push fluids and rest. Update if not improving with treatment. Tessalon perls for cough

## 2017-12-29 NOTE — Progress Notes (Signed)
BP 126/84 (BP Location: Left Arm, Patient Position: Sitting, Cuff Size: Normal)   Pulse 69   Temp 98.2 F (36.8 C) (Oral)   Ht 5\' 2"  (1.575 m)   Wt 165 lb (74.8 kg)   SpO2 96%   BMI 30.18 kg/m    CC: cough Subjective:    Patient ID: Brooke Beasley, female    DOB: 1931/01/26, 84 y.o.   MRN: [id number]  HPI: Brooke Beasley is a 84 y.o. female presenting on 12/29/2017 for Cough (Here for f/u. States cough is still about the same. Says the phlegm feels stuck to the inside of her chest and her abd is sore from coughing so much. Says she feels tired. Pt is accompanied by her grandson. )   See prior note for details.  Ongoing cough for 1+ weeks. No fevers/chills. Acutely worse in the last few days.  Treating with flonase and robitussin. Cough productive of greenish phlegm. Some dyspnea with exertion without chest pain. Night sweats. Cough feels stuck in chest. abd wall soreness with cough. Hoarse voice. Some R mid back pain.   Daughter and SIL sick at home as well.   Relevant past medical, surgical, family and social history reviewed and updated as indicated. Interim medical history since our last visit reviewed. Allergies and medications reviewed and updated. Outpatient Medications Prior to Visit  Medication Sig Dispense Refill  . acetaminophen (TYLENOL) 500 MG tablet Take 500-1,000 mg by mouth at bedtime. Depends on pain    . amLODipine (NORVASC) 10 MG tablet Take 1 tablet (10 mg total) by mouth daily. 90 tablet 3  . bimatoprost (LUMIGAN) 0.01 % SOLN Place 1 drop into both eyes at bedtime.    Marland Kitchen Bioflavonoid Products (VITAMIN C) CHEW Chew 1 tablet by mouth daily.    . cholecalciferol (VITAMIN D) 1000 units tablet Take 1,000 Units by mouth daily.    . clonazePAM (KLONOPIN) 0.5 MG tablet TAKE 1 TABLET BY MOUTH AT BEDTIME AS NEEDED FOR ANXIETY 30 tablet 3  . colchicine 0.6 MG tablet Take 1 tablet by mouth daily as needed 90 tablet 0  . Diclofenac Sodium CR (VOLTAREN-XR) 100 MG 24 hr  tablet Take 1 tablet (100 mg total) by mouth daily. 10 tablet 0  . docusate sodium (COLACE) 100 MG capsule Take 1 capsule (100 mg total) by mouth 2 (two) times daily.  0  . famotidine (PEPCID) 20 MG tablet Take 1 tablet (20 mg total) by mouth daily as needed for heartburn or indigestion.    . fish oil-omega-3 fatty acids 1000 MG capsule Take 1 g by mouth daily.     . furosemide (LASIX) 20 MG tablet Take 1 tablet (20 mg total) by mouth daily as needed for edema. 30 tablet 3  . hydrocortisone (ANUSOL-HC) 25 MG suppository Place 1 suppository (25 mg total) rectally 2 (two) times daily. 12 suppository 2  . levothyroxine (SYNTHROID, LEVOTHROID) 100 MCG tablet Take 1 tablet (100 mcg total) by mouth daily. 30 tablet 11  . Misc Natural Products (OSTEO BI-FLEX TRIPLE STRENGTH PO) Take 1 tablet by mouth daily. With Vit D    . potassium chloride (K-DUR) 10 MEQ tablet Take 1 tablet (10 mEq total) by mouth daily as needed (with lasix). 30 tablet 3  . Pramox-PE-Glycerin-Petrolatum (PREPARATION H) 1-0.25-14.4-15 % CREA Use as directed 26 g 0  . pravastatin (PRAVACHOL) 20 MG tablet Take 1 tablet (20 mg total) by mouth daily. 90 tablet 3  . Psyllium (METAMUCIL) 30.9 % POWD daily    .  sertraline (ZOLOFT) 100 MG tablet Take 1 tablet (100 mg total) by mouth daily. 90 tablet 3  . simethicone (GAS-X) 80 MG chewable tablet Chew 1 tablet (80 mg total) by mouth 2 (two) times daily.    . TURMERIC PO Take 1 tablet by mouth daily.    Marland Kitchen VITAMIN E PO Take 1 tablet by mouth daily.    . VOLTAREN 1 % GEL APPLY ONE APPLICATION TOPICALLY 3 TIMES DAILY (Patient taking differently: APPLY ONE APPLICATION TOPICALLY daily as needed for shoulder pain) 100 g 3   No facility-administered medications prior to visit.      Per HPI unless specifically indicated in ROS section below Review of Systems     Objective:    BP 126/84 (BP Location: Left Arm, Patient Position: Sitting, Cuff Size: Normal)   Pulse 69   Temp 98.2 F (36.8 C)  (Oral)   Ht 5\' 2"  (1.575 m)   Wt 165 lb (74.8 kg)   SpO2 96%   BMI 30.18 kg/m   Wt Readings from Last 3 Encounters:  12/29/17 165 lb (74.8 kg)  12/22/17 164 lb (74.4 kg)  10/17/17 167 lb 12 oz (76.1 kg)    Physical Exam  Constitutional: She appears well-developed and well-nourished. No distress.  HENT:  Head: Normocephalic and atraumatic.  Right Ear: Hearing, tympanic membrane, external ear and ear canal normal.  Left Ear: Hearing, tympanic membrane, external ear and ear canal normal.  Nose: No mucosal edema or rhinorrhea. Right sinus exhibits no maxillary sinus tenderness and no frontal sinus tenderness. Left sinus exhibits no maxillary sinus tenderness and no frontal sinus tenderness.  Mouth/Throat: Uvula is midline, oropharynx is clear and moist and mucous membranes are normal. No oropharyngeal exudate, posterior oropharyngeal edema, posterior oropharyngeal erythema or tonsillar abscesses.  Eyes: Pupils are equal, round, and reactive to light. Conjunctivae and EOM are normal. No scleral icterus.  Neck: Normal range of motion. Neck supple.  Cardiovascular: Normal rate, regular rhythm, normal heart sounds and intact distal pulses.  No murmur heard. Pulmonary/Chest: Effort normal. No respiratory distress. She has decreased breath sounds (bibasilar crackles). She has no wheezes. She has rhonchi. She has no rales.  Deep harsh cough present  Lymphadenopathy:    She has no cervical adenopathy.  Skin: Skin is warm and dry. No rash noted.  Nursing note and vitals reviewed.  Results for orders placed or performed in visit on 12/19/17  Renal function panel  Result Value Ref Range   Sodium 135 135 - 145 mEq/L   Potassium 3.7 3.5 - 5.1 mEq/L   Chloride 98 96 - 112 mEq/L   CO2 29 19 - 32 mEq/L   Calcium 9.5 8.4 - 10.5 mg/dL   Albumin 4.2 3.5 - 5.2 g/dL   BUN 15 6 - 23 mg/dL   Creatinine, Ser 1.09 0.40 - 1.20 mg/dL   Glucose, Bld 115 (H) 70 - 99 mg/dL   Phosphorus 3.0 2.3 - 4.6 mg/dL    GFR 50.70 (L) >60.00 mL/min  T4, free  Result Value Ref Range   Free T4 0.75 0.60 - 1.60 ng/dL  TSH  Result Value Ref Range   TSH 7.71 (H) 0.35 - 4.50 uIU/mL      Assessment & Plan:   Problem List Items Addressed This Visit    CAP (community acquired pneumonia) - Primary    Persistent and worsening- concern for bacterial cause. CXR with L pleural effusion and possible infiltrate - will await radiology read. Will treat as CAP with  IM rocephin 1gm and zpack antibiotic. Further supportive care reviewed. Push fluids and rest. Update if not improving with treatment. Tessalon perls for cough      Relevant Medications   azithromycin (ZITHROMAX) 250 MG tablet   benzonatate (TESSALON) 100 MG capsule   cefTRIAXone (ROCEPHIN) injection 1 g (Completed) (Start on 12/29/2017  9:15 AM)       Meds ordered this encounter  Medications  . azithromycin (ZITHROMAX) 250 MG tablet    Sig: Take two tablets on day one followed by one tablet on days 2-5    Dispense:  6 each    Refill:  0  . benzonatate (TESSALON) 100 MG capsule    Sig: Take 1 capsule (100 mg total) by mouth 3 (three) times daily as needed for cough.    Dispense:  30 capsule    Refill:  0  . cefTRIAXone (ROCEPHIN) injection 1 g   Orders Placed This Encounter  Procedures  . DG Chest 2 View    Standing Status:   Future    Number of Occurrences:   1    Standing Expiration Date:   03/01/2019    Order Specific Question:   Reason for Exam (SYMPTOM  OR DIAGNOSIS REQUIRED)    Answer:   deep cough x1+ wks, bibasilar rhonchi    Order Specific Question:   Preferred imaging location?    Answer:   [facility]    Order Specific Question:   Radiology Contrast Protocol - do NOT remove file path    Answer:   [email or web address]    Follow up plan: Return if symptoms worsen or fail to improve.  [name], MD

## 2017-12-29 NOTE — Patient Instructions (Addendum)
Chest xray today - some fluid in lungs and possible pneumonia - we will await radiology read. Shot of antibiotic provided today (rocephin 1 gram)  Take azithromycin (zpack).  Push fluids and rest.  Continue robitussin DM. May take tessalon perls. Take plain mucinex (guaifenesin) with large glass of water to help mobilize mucous.  Let us know if fever >101, worsening cough, or not improving as expected.

## 2018-01-04 ENCOUNTER — Encounter: Payer: Self-pay | Admitting: Family Medicine

## 2018-01-04 ENCOUNTER — Ambulatory Visit (INDEPENDENT_AMBULATORY_CARE_PROVIDER_SITE_OTHER): Payer: PPO | Admitting: Family Medicine

## 2018-01-04 VITALS — BP 136/78 | HR 71 | Temp 98.4°F | Ht 62.0 in | Wt 167.2 lb

## 2018-01-04 DIAGNOSIS — J181 Lobar pneumonia, unspecified organism: Secondary | ICD-10-CM

## 2018-01-04 DIAGNOSIS — J189 Pneumonia, unspecified organism: Secondary | ICD-10-CM

## 2018-01-04 MED ORDER — LEVOFLOXACIN 500 MG PO TABS: 500.0000 mg | ORAL_TABLET | Freq: Every day | ORAL | 0 refills | Status: DC

## 2018-01-04 NOTE — Patient Instructions (Addendum)
Lungs are sounding better. Given not feeling better, take levaquin 500mg  daily for 5 days Continue mucinex, robitussin. Push fluids and rest. Update [place] with effect.

## 2018-01-04 NOTE — Assessment & Plan Note (Signed)
This is clinically better however pt endorses persistent malaise, fatigue, cough. Therefore given age and frailty will broaden antibiotic to 5d levaquin course. Update with effect after abx course. Pt and daughter agree with plan.

## 2018-01-04 NOTE — Progress Notes (Signed)
BP 136/78 (BP Location: Left Arm, Patient Position: Sitting, Cuff Size: Normal)   Pulse 71   Temp 98.4 F (36.9 C) (Oral)   Ht 5\' 2"  (1.575 m)   Wt 167 lb 4 oz (75.9 kg)   SpO2 96%   BMI 30.59 kg/m    CC: ongoing chest congestion Subjective:    Patient ID: Brooke Beasley, female    DOB: [birth year], 84 y.o.   MRN: [id number]  HPI: Brooke Beasley is a 84 y.o. female presenting on 01/04/2018 for Chest Congestion (Pt states she still has chest congestion and feeling tired. Says she is no better. Pt is accomapanied by her daughter. )   See prior note for details. Seen here last week with 1+ wk h/o progressively worsening productive cough, treated with rocephin 1gm and zpack for community acquired pneumonia. Initial improvement but once she finished zpack she started feeling more weak and ill again. Increased cough in the mornings and evenings. R tender LN. + dyspneic and wheezing  No fevers/chills, ST.  Daughter and grand daughter and grandson also sick.   CXR from last week showed left costophrenic opacity concerning for PNA.   Relevant past medical, surgical, family and social history reviewed and updated as indicated. Interim medical history since our last visit reviewed. Allergies and medications reviewed and updated. Outpatient Medications Prior to Visit  Medication Sig Dispense Refill  . acetaminophen (TYLENOL) 500 MG tablet Take 500-1,000 mg by mouth at bedtime. Depends on pain    . amLODipine (NORVASC) 10 MG tablet Take 1 tablet (10 mg total) by mouth daily. 90 tablet 3  . benzonatate (TESSALON) 100 MG capsule Take 1 capsule (100 mg total) by mouth 3 (three) times daily as needed for cough. 30 capsule 0  . bimatoprost (LUMIGAN) 0.01 % SOLN Place 1 drop into both eyes at bedtime.    Marland Kitchen Bioflavonoid Products (VITAMIN C) CHEW Chew 1 tablet by mouth daily.    . cholecalciferol (VITAMIN D) 1000 units tablet Take 1,000 Units by mouth daily.    . clonazePAM (KLONOPIN) 0.5 MG tablet  TAKE 1 TABLET BY MOUTH AT BEDTIME AS NEEDED FOR ANXIETY 30 tablet 3  . colchicine 0.6 MG tablet Take 1 tablet by mouth daily as needed 90 tablet 0  . Diclofenac Sodium CR (VOLTAREN-XR) 100 MG 24 hr tablet Take 1 tablet (100 mg total) by mouth daily. 10 tablet 0  . docusate sodium (COLACE) 100 MG capsule Take 1 capsule (100 mg total) by mouth 2 (two) times daily.  0  . famotidine (PEPCID) 20 MG tablet Take 1 tablet (20 mg total) by mouth daily as needed for heartburn or indigestion.    . fish oil-omega-3 fatty acids 1000 MG capsule Take 1 g by mouth daily.     . furosemide (LASIX) 20 MG tablet Take 1 tablet (20 mg total) by mouth daily as needed for edema. 30 tablet 3  . hydrocortisone (ANUSOL-HC) 25 MG suppository Place 1 suppository (25 mg total) rectally 2 (two) times daily. 12 suppository 2  . levothyroxine (SYNTHROID, LEVOTHROID) 100 MCG tablet Take 1 tablet (100 mcg total) by mouth daily. 30 tablet 11  . Misc Natural Products (OSTEO BI-FLEX TRIPLE STRENGTH PO) Take 1 tablet by mouth daily. With Vit D    . potassium chloride (K-DUR) 10 MEQ tablet Take 1 tablet (10 mEq total) by mouth daily as needed (with lasix). 30 tablet 3  . Pramox-PE-Glycerin-Petrolatum (PREPARATION H) 1-0.25-14.4-15 % CREA Use as directed 26 g 0  .  pravastatin (PRAVACHOL) 20 MG tablet Take 1 tablet (20 mg total) by mouth daily. 90 tablet 3  . Psyllium (METAMUCIL) 30.9 % POWD daily    . sertraline (ZOLOFT) 100 MG tablet Take 1 tablet (100 mg total) by mouth daily. 90 tablet 3  . simethicone (GAS-X) 80 MG chewable tablet Chew 1 tablet (80 mg total) by mouth 2 (two) times daily.    . TURMERIC PO Take 1 tablet by mouth daily.    Marland Kitchen VITAMIN E PO Take 1 tablet by mouth daily.    . VOLTAREN 1 % GEL APPLY ONE APPLICATION TOPICALLY 3 TIMES DAILY (Patient taking differently: APPLY ONE APPLICATION TOPICALLY daily as needed for shoulder pain) 100 g 3  . azithromycin (ZITHROMAX) 250 MG tablet Take two tablets on day one followed by one  tablet on days 2-5 6 each 0   No facility-administered medications prior to visit.      Per HPI unless specifically indicated in ROS section below Review of Systems     Objective:    BP 136/78 (BP Location: Left Arm, Patient Position: Sitting, Cuff Size: Normal)   Pulse 71   Temp 98.4 F (36.9 C) (Oral)   Ht 5\' 2"  (1.575 m)   Wt 167 lb 4 oz (75.9 kg)   SpO2 96%   BMI 30.59 kg/m   Wt Readings from Last 3 Encounters:  01/04/18 167 lb 4 oz (75.9 kg)  12/29/17 165 lb (74.8 kg)  12/22/17 164 lb (74.4 kg)    Physical Exam  Constitutional: She appears well-developed and well-nourished. No distress.  HENT:  Head: Normocephalic and atraumatic.  Right Ear: Hearing, tympanic membrane, external ear and ear canal normal.  Left Ear: Hearing, tympanic membrane, external ear and ear canal normal.  Mouth/Throat: Uvula is midline, oropharynx is clear and moist and mucous membranes are normal. No oropharyngeal exudate, posterior oropharyngeal edema, posterior oropharyngeal erythema or tonsillar abscesses.  Eyes: Pupils are equal, round, and reactive to light. Conjunctivae and EOM are normal. No scleral icterus.  Neck: Normal range of motion. Neck supple.  Cardiovascular: Normal rate, regular rhythm, normal heart sounds and intact distal pulses.  No murmur heard. Pulmonary/Chest: Effort normal and breath sounds normal. No respiratory distress. She has no wheezes. She has no rales.  Lungs overall clear Deep hoarse wet cough remains  Lymphadenopathy:    She has no cervical adenopathy.  Skin: Skin is warm and dry. No rash noted.  Nursing note and vitals reviewed.   DG Chest 2 View CLINICAL DATA:  Deep cough for 1 week  EXAM: CHEST - 2 VIEW  COMPARISON:  CT abdomen 08/11/2016  FINDINGS: There is an opacity along the left costophrenic angle on the frontal view not well delineated on the lateral view concerning for atelectasis versus pneumonia. There is no pleural effusion  or pneumothorax. The heart and mediastinal contours are unremarkable.  The osseous structures are unremarkable.  IMPRESSION: Opacity along the left costophrenic angle on the frontal view not well delineated on the lateral view concerning for atelectasis versus pneumonia.  Electronically Signed   By: Kathreen Devoid   On: 12/29/2017 13:21   Lab Results  Component Value Date   TSH 7.71 (H) 12/19/2017       Assessment & Plan:   Problem List Items Addressed This Visit    CAP (community acquired pneumonia) - Primary    This is clinically better however pt endorses persistent malaise, fatigue, cough. Therefore given age and frailty will broaden antibiotic to 5d  levaquin course. Update with effect after abx course. Pt and daughter agree with plan.       Relevant Medications   levofloxacin (LEVAQUIN) 500 MG tablet       Meds ordered this encounter  Medications  . levofloxacin (LEVAQUIN) 500 MG tablet    Sig: Take 1 tablet (500 mg total) by mouth daily.    Dispense:  5 tablet    Refill:  0   No orders of the defined types were placed in this encounter.   Follow up plan: Return if symptoms worsen or fail to improve.  [name], MD

## 2018-01-07 NOTE — Addendum Note (Signed)
Addended by: [name] on: 01/07/2018 10:07 AM   Modules accepted: Level of Service

## 2018-01-12 ENCOUNTER — Encounter: Payer: Self-pay | Admitting: Family Medicine

## 2018-01-12 ENCOUNTER — Ambulatory Visit (INDEPENDENT_AMBULATORY_CARE_PROVIDER_SITE_OTHER): Payer: PPO | Admitting: Family Medicine

## 2018-01-12 VITALS — BP 122/72 | HR 74 | Temp 98.0°F | Ht 62.0 in | Wt 165.0 lb

## 2018-01-12 DIAGNOSIS — J181 Lobar pneumonia, unspecified organism: Secondary | ICD-10-CM

## 2018-01-12 DIAGNOSIS — J189 Pneumonia, unspecified organism: Secondary | ICD-10-CM

## 2018-01-12 LAB — CBC WITH DIFFERENTIAL/PLATELET
Basophils Absolute: 0 10*3/uL (ref 0.0–0.1)
Basophils Relative: 0.5 % (ref 0.0–3.0)
EOS PCT: 7.7 % — AB (ref 0.0–5.0)
Eosinophils Absolute: 0.5 10*3/uL (ref 0.0–0.7)
HEMATOCRIT: 39.4 % (ref 36.0–46.0)
HEMOGLOBIN: 13.3 g/dL (ref 12.0–15.0)
LYMPHS PCT: 31.7 % (ref 12.0–46.0)
Lymphs Abs: 2.2 10*3/uL (ref 0.7–4.0)
MCHC: 33.8 g/dL (ref 30.0–36.0)
MCV: 90.6 fl (ref 78.0–100.0)
MONO ABS: 0.9 10*3/uL (ref 0.1–1.0)
MONOS PCT: 13.2 % — AB (ref 3.0–12.0)
Neutro Abs: 3.2 10*3/uL (ref 1.4–7.7)
Neutrophils Relative %: 46.9 % (ref 43.0–77.0)
Platelets: 320 10*3/uL (ref 150.0–400.0)
RBC: 4.35 Mil/uL (ref 3.87–5.11)
RDW: 14 % (ref 11.5–15.5)
WBC: 6.9 10*3/uL (ref 4.0–10.5)

## 2018-01-12 MED ORDER — ALBUTEROL SULFATE HFA 108 (90 BASE) MCG/ACT IN AERS: 2.0000 | INHALATION_SPRAY | Freq: Four times a day (QID) | RESPIRATORY_TRACT | 0 refills | Status: DC | PRN

## 2018-01-12 NOTE — Patient Instructions (Addendum)
Labs today to check for infection.  Give resolution of pneumonia a bit more time.  Continue tessalon perls.  In the interim, may try albuterol inhaler to help breathing.  Schedule chest xray for the first week of October.

## 2018-01-12 NOTE — Progress Notes (Signed)
BP 122/72 (BP Location: Left Arm, Patient Position: Sitting, Cuff Size: Normal)   Pulse 74   Temp 98 F (36.7 C) (Oral)   Ht 5\' 2"  (1.575 m)   Wt 165 lb (74.8 kg)   SpO2 95%   BMI 30.18 kg/m    CC: PNA f/u Subjective:    Patient ID: Brooke Beasley, female    DOB: 08/03/1930, 84 y.o.   MRN: [id number]  HPI: Brooke Beasley is a 84 y.o. female presenting on 01/12/2018 for Pneumonia (Here for f/u. States she feels the same. Still having night sweats. Pt accompanied by her daugnhter, [name].)   See prior note for details. Seen here previously twice this month with CAP by CXR treated first with rocephin IM and azithromycin course then with levaquin 5d course. Endorses persistent weakness and dyspnea as well as persistent night sweats. No fevers. Still with productive cough but this is improving. Tessalon perls help some. She continues mucinex in the morning and robitussin at night.   Relevant past medical, surgical, family and social history reviewed and updated as indicated. Interim medical history since our last visit reviewed. Allergies and medications reviewed and updated. Outpatient Medications Prior to Visit  Medication Sig Dispense Refill  . acetaminophen (TYLENOL) 500 MG tablet Take 500-1,000 mg by mouth at bedtime. Depends on pain    . amLODipine (NORVASC) 10 MG tablet Take 1 tablet (10 mg total) by mouth daily. 90 tablet 3  . benzonatate (TESSALON) 100 MG capsule Take 1 capsule (100 mg total) by mouth 3 (three) times daily as needed for cough. 30 capsule 0  . bimatoprost (LUMIGAN) 0.01 % SOLN Place 1 drop into both eyes at bedtime.    Marland Kitchen Bioflavonoid Products (VITAMIN C) CHEW Chew 1 tablet by mouth daily.    . cholecalciferol (VITAMIN D) 1000 units tablet Take 1,000 Units by mouth daily.    . clonazePAM (KLONOPIN) 0.5 MG tablet TAKE 1 TABLET BY MOUTH AT BEDTIME AS NEEDED FOR ANXIETY 30 tablet 3  . colchicine 0.6 MG tablet Take 1 tablet by mouth daily as needed 90 tablet 0  .  Diclofenac Sodium CR (VOLTAREN-XR) 100 MG 24 hr tablet Take 1 tablet (100 mg total) by mouth daily. 10 tablet 0  . docusate sodium (COLACE) 100 MG capsule Take 1 capsule (100 mg total) by mouth 2 (two) times daily.  0  . famotidine (PEPCID) 20 MG tablet Take 1 tablet (20 mg total) by mouth daily as needed for heartburn or indigestion.    . fish oil-omega-3 fatty acids 1000 MG capsule Take 1 g by mouth daily.     . furosemide (LASIX) 20 MG tablet Take 1 tablet (20 mg total) by mouth daily as needed for edema. 30 tablet 3  . hydrocortisone (ANUSOL-HC) 25 MG suppository Place 1 suppository (25 mg total) rectally 2 (two) times daily. 12 suppository 2  . levofloxacin (LEVAQUIN) 500 MG tablet Take 1 tablet (500 mg total) by mouth daily. 5 tablet 0  . levothyroxine (SYNTHROID, LEVOTHROID) 100 MCG tablet Take 1 tablet (100 mcg total) by mouth daily. 30 tablet 11  . Misc Natural Products (OSTEO BI-FLEX TRIPLE STRENGTH PO) Take 1 tablet by mouth daily. With Vit D    . potassium chloride (K-DUR) 10 MEQ tablet Take 1 tablet (10 mEq total) by mouth daily as needed (with lasix). 30 tablet 3  . Pramox-PE-Glycerin-Petrolatum (PREPARATION H) 1-0.25-14.4-15 % CREA Use as directed 26 g 0  . pravastatin (PRAVACHOL) 20 MG tablet Take  1 tablet (20 mg total) by mouth daily. 90 tablet 3  . Psyllium (METAMUCIL) 30.9 % POWD daily    . sertraline (ZOLOFT) 100 MG tablet Take 1 tablet (100 mg total) by mouth daily. 90 tablet 3  . simethicone (GAS-X) 80 MG chewable tablet Chew 1 tablet (80 mg total) by mouth 2 (two) times daily.    . TURMERIC PO Take 1 tablet by mouth daily.    Marland Kitchen VITAMIN E PO Take 1 tablet by mouth daily.    . VOLTAREN 1 % GEL APPLY ONE APPLICATION TOPICALLY 3 TIMES DAILY (Patient taking differently: APPLY ONE APPLICATION TOPICALLY daily as needed for shoulder pain) 100 g 3   No facility-administered medications prior to visit.      Per HPI unless specifically indicated in ROS section below Review of  Systems     Objective:    BP 122/72 (BP Location: Left Arm, Patient Position: Sitting, Cuff Size: Normal)   Pulse 74   Temp 98 F (36.7 C) (Oral)   Ht 5\' 2"  (1.575 m)   Wt 165 lb (74.8 kg)   SpO2 95%   BMI 30.18 kg/m   Wt Readings from Last 3 Encounters:  01/12/18 165 lb (74.8 kg)  01/04/18 167 lb 4 oz (75.9 kg)  12/29/17 165 lb (74.8 kg)    Physical Exam  Constitutional: She appears well-developed and well-nourished. No distress.  HENT:  Head: Normocephalic and atraumatic.  Right Ear: Hearing, tympanic membrane, external ear and ear canal normal.  Left Ear: Hearing, tympanic membrane, external ear and ear canal normal.  Nose: No mucosal edema or rhinorrhea. Right sinus exhibits no maxillary sinus tenderness and no frontal sinus tenderness. Left sinus exhibits no maxillary sinus tenderness and no frontal sinus tenderness.  Mouth/Throat: Uvula is midline, oropharynx is clear and moist and mucous membranes are normal. No oropharyngeal exudate, posterior oropharyngeal edema, posterior oropharyngeal erythema or tonsillar abscesses.  Eyes: Pupils are equal, round, and reactive to light. Conjunctivae and EOM are normal. No scleral icterus.  Neck: Normal range of motion. Neck supple.  Cardiovascular: Normal rate, regular rhythm, normal heart sounds and intact distal pulses.  No murmur heard. Pulmonary/Chest: Effort normal. No respiratory distress. She has no wheezes. She has no rales.  Bibasilar crackles with decreased breath sounds  Lymphadenopathy:    She has no cervical adenopathy.  Skin: Skin is warm and dry. No rash noted.  Nursing note and vitals reviewed.  Results for orders placed or performed in visit on 01/12/18  CBC with Differential/Platelet  Result Value Ref Range   WBC 6.9 4.0 - 10.5 K/uL   RBC 4.35 3.87 - 5.11 Mil/uL   Hemoglobin 13.3 12.0 - 15.0 g/dL   HCT 39.4 36.0 - 46.0 %   MCV 90.6 78.0 - 100.0 fl   MCHC 33.8 30.0 - 36.0 g/dL   RDW 14.0 11.5 - 15.5 %    Platelets 320.0 150.0 - 400.0 K/uL   Neutrophils Relative % 46.9 43.0 - 77.0 %   Lymphocytes Relative 31.7 12.0 - 46.0 %   Monocytes Relative 13.2 (H) 3.0 - 12.0 %   Eosinophils Relative 7.7 (H) 0.0 - 5.0 %   Basophils Relative 0.5 0.0 - 3.0 %   Neutro Abs 3.2 1.4 - 7.7 K/uL   Lymphs Abs 2.2 0.7 - 4.0 K/uL   Monocytes Absolute 0.9 0.1 - 1.0 K/uL   Eosinophils Absolute 0.5 0.0 - 0.7 K/uL   Basophils Absolute 0.0 0.0 - 0.1 K/uL  Assessment & Plan:   Problem List Items Addressed This Visit    CAP (community acquired pneumonia) - Primary    Slow to improve but still improving. I don't see ongoing signs of bacterial infection - no indication for further antibiotics at this time. Will check CBC to eval for ongoing infection, will Rx albuterol inhaler to see if improvement in shortness of breath (she has previously tolerated this well). I did ask her to return in 2 weeks for rpt CXR to document resolution of PNA. Pt and daughter agree with plan.       Relevant Medications   albuterol (PROVENTIL HFA;VENTOLIN HFA) 108 (90 Base) MCG/ACT inhaler   Other Relevant Orders   CBC with Differential/Platelet (Completed)   DG Chest 2 View       Meds ordered this encounter  Medications  . albuterol (PROVENTIL HFA;VENTOLIN HFA) 108 (90 Base) MCG/ACT inhaler    Sig: Inhale 2 puffs into the lungs every 6 (six) hours as needed for wheezing or shortness of breath.    Dispense:  1 Inhaler    Refill:  0   Orders Placed This Encounter  Procedures  . DG Chest 2 View    Standing Status:   Future    Standing Expiration Date:   03/17/2019    Order Specific Question:   Reason for Exam (SYMPTOM  OR DIAGNOSIS REQUIRED)    Answer:   fu PNA    Order Specific Question:   Preferred imaging location?    Answer:   [facility]    Order Specific Question:   Radiology Contrast Protocol - do NOT remove file path    Answer:   [email or web address]  . CBC with  Differential/Platelet    Follow up plan: Return if symptoms worsen or fail to improve.  [name], MD

## 2018-01-14 NOTE — Assessment & Plan Note (Signed)
Slow to improve but still improving. I don't see ongoing signs of bacterial infection - no indication for further antibiotics at this time. Will check CBC to eval for ongoing infection, will Rx albuterol inhaler to see if improvement in shortness of breath (she has previously tolerated this well). I did ask her to return in 2 weeks for rpt CXR to document resolution of PNA. Pt and daughter agree with plan.

## 2018-01-23 ENCOUNTER — Other Ambulatory Visit: Payer: Self-pay | Admitting: Family Medicine

## 2018-01-23 NOTE — Telephone Encounter (Signed)
Electronic refill request Last office visit 01/12/18 Last refill 12/29/16 #90/3 See allergy/contraindication

## 2018-01-25 ENCOUNTER — Ambulatory Visit (INDEPENDENT_AMBULATORY_CARE_PROVIDER_SITE_OTHER)
Admission: RE | Admit: 2018-01-25 | Discharge: 2018-01-25 | Disposition: A | Discharge status: Home / Self Care | Payer: PPO | Source: Ambulatory Visit | Attending: Family Medicine | Admitting: Family Medicine

## 2018-01-25 ENCOUNTER — Other Ambulatory Visit: Payer: Self-pay | Admitting: Family Medicine

## 2018-01-25 DIAGNOSIS — J189 Pneumonia, unspecified organism: Secondary | ICD-10-CM

## 2018-01-25 DIAGNOSIS — J181 Lobar pneumonia, unspecified organism: Secondary | ICD-10-CM | POA: Diagnosis not present

## 2018-02-13 ENCOUNTER — Encounter: Payer: Self-pay | Admitting: Family Medicine

## 2018-02-13 ENCOUNTER — Other Ambulatory Visit: Payer: Self-pay | Admitting: Family Medicine

## 2018-02-13 DIAGNOSIS — E039 Hypothyroidism, unspecified: Secondary | ICD-10-CM

## 2018-02-15 ENCOUNTER — Other Ambulatory Visit (INDEPENDENT_AMBULATORY_CARE_PROVIDER_SITE_OTHER): Payer: PPO

## 2018-02-15 DIAGNOSIS — E039 Hypothyroidism, unspecified: Secondary | ICD-10-CM

## 2018-02-15 LAB — TSH: TSH: 0.88 u[IU]/mL (ref 0.35–4.50)

## 2018-02-15 LAB — T4, FREE: Free T4: 1.02 ng/dL (ref 0.60–1.60)

## 2018-02-16 ENCOUNTER — Ambulatory Visit
Admission: RE | Admit: 2018-02-16 | Discharge: 2018-02-16 | Disposition: A | Discharge status: Home / Self Care | Payer: PPO | Source: Ambulatory Visit | Attending: Family Medicine | Admitting: Family Medicine

## 2018-02-16 DIAGNOSIS — M810 Age-related osteoporosis without current pathological fracture: Secondary | ICD-10-CM | POA: Diagnosis not present

## 2018-02-16 DIAGNOSIS — M85851 Other specified disorders of bone density and structure, right thigh: Secondary | ICD-10-CM | POA: Diagnosis not present

## 2018-02-16 DIAGNOSIS — Z780 Asymptomatic menopausal state: Secondary | ICD-10-CM | POA: Diagnosis not present

## 2018-02-19 ENCOUNTER — Other Ambulatory Visit: Payer: Self-pay | Admitting: Family Medicine

## 2018-02-19 DIAGNOSIS — M810 Age-related osteoporosis without current pathological fracture: Secondary | ICD-10-CM

## 2018-02-20 NOTE — Progress Notes (Signed)
BP 120/70 (BP Location: Left Arm, Patient Position: Sitting, Cuff Size: Normal)   Pulse 64   Temp 97.6 F (36.4 C) (Oral)   Ht 5\' 2"  (1.575 m)   Wt 160 lb 4 oz (72.7 kg)   SpO2 96%   BMI 29.31 kg/m    CC: 2 mo f/u visit Subjective:    Patient ID: Brooke Beasley, female    DOB: 26-Sep-1930, 84 y.o.   MRN: [id number]  HPI: Brooke Beasley is a 84 y.o. female presenting on 02/21/2018 for Hypothyroidism (Here for 2 mo f/u.)   Ongoing GI trouble - some improvement in abd pain but persistent trouble with chronic IBS predominant constipation. She continues metamucil, powder pudding, miralax PRN. Avoids lettuce, cabbage, seeds. Has had unrevealing GI eval including CTs, CT colonography 07/2016, anal manometry and PFPT.   Ongoing cough after CAP 12/2017. Persistent cough productive of phlegm.  Continued night sweats. No fevers. She feels straining worsens hemorrhoids. No smoking history.   Recent thyroid function labs stable.   Relevant past medical, surgical, family and social history reviewed and updated as indicated. Interim medical history since our last visit reviewed. Allergies and medications reviewed and updated. Outpatient Medications Prior to Visit  Medication Sig Dispense Refill  . acetaminophen (TYLENOL) 500 MG tablet Take 500-1,000 mg by mouth at bedtime. Depends on pain    . albuterol (PROVENTIL HFA;VENTOLIN HFA) 108 (90 Base) MCG/ACT inhaler Inhale 2 puffs into the lungs every 6 (six) hours as needed for wheezing or shortness of breath. 1 Inhaler 0  . amLODipine (NORVASC) 10 MG tablet Take 1 tablet (10 mg total) by mouth daily. 90 tablet 3  . benzonatate (TESSALON) 100 MG capsule Take 1 capsule (100 mg total) by mouth 3 (three) times daily as needed for cough. 30 capsule 0  . bimatoprost (LUMIGAN) 0.01 % SOLN Place 1 drop into both eyes at bedtime.    Marland Kitchen Bioflavonoid Products (VITAMIN C) CHEW Chew 1 tablet by mouth daily.    . cholecalciferol (VITAMIN D) 1000  units tablet Take 1,000 Units by mouth daily.    . clonazePAM (KLONOPIN) 0.5 MG tablet TAKE 1 TABLET BY MOUTH AT BEDTIME AS NEEDED FOR ANXIETY 30 tablet 3  . colchicine 0.6 MG tablet Take 1 tablet by mouth daily as needed 90 tablet 0  . Diclofenac Sodium CR (VOLTAREN-XR) 100 MG 24 hr tablet Take 1 tablet (100 mg total) by mouth daily. 10 tablet 0  . docusate sodium (COLACE) 100 MG capsule Take 1 capsule (100 mg total) by mouth 2 (two) times daily.  0  . famotidine (PEPCID) 20 MG tablet Take 1 tablet (20 mg total) by mouth daily as needed for heartburn or indigestion.    . fish oil-omega-3 fatty acids 1000 MG capsule Take 1 g by mouth daily.     . hydrocortisone (ANUSOL-HC) 25 MG suppository Place 1 suppository (25 mg total) rectally 2 (two) times daily. 12 suppository 2  . levothyroxine (SYNTHROID, LEVOTHROID) 100 MCG tablet Take 1 tablet (100 mcg total) by mouth daily. 30 tablet 11  . Misc Natural Products (OSTEO BI-FLEX TRIPLE STRENGTH PO) Take 1 tablet by mouth daily. With Vit D    . Pramox-PE-Glycerin-Petrolatum (PREPARATION H) 1-0.25-14.4-15 % CREA Use as directed 26 g 0  . pravastatin (PRAVACHOL) 20 MG tablet Take 1 tablet (20 mg total) by mouth daily. 90 tablet 3  . Psyllium (METAMUCIL) 30.9 % POWD daily    . sertraline (ZOLOFT) 100 MG tablet Take 1 tablet  by mouth daily 90 tablet 2  . simethicone (GAS-X) 80 MG chewable tablet Chew 1 tablet (80 mg total) by mouth 2 (two) times daily.    . TURMERIC PO Take 1 tablet by mouth daily.    Marland Kitchen VITAMIN E PO Take 1 tablet by mouth daily.    . VOLTAREN 1 % GEL APPLY ONE APPLICATION TOPICALLY 3 TIMES DAILY (Patient taking differently: APPLY ONE APPLICATION TOPICALLY daily as needed for shoulder pain) 100 g 3  . furosemide (LASIX) 20 MG tablet TAKE 1 TABLET BY MOUTH ONCE DAILY AS NEEDED FOR  EDEMA 30 tablet 0  . potassium chloride (K-DUR) 10 MEQ tablet Take 1 tablet (10 mEq total) by mouth daily as needed (with lasix). 30 tablet 3   No  facility-administered medications prior to visit.      Per HPI unless specifically indicated in ROS section below Review of Systems     Objective:    BP 120/70 (BP Location: Left Arm, Patient Position: Sitting, Cuff Size: Normal)   Pulse 64   Temp 97.6 F (36.4 C) (Oral)   Ht 5\' 2"  (1.575 m)   Wt 160 lb 4 oz (72.7 kg)   SpO2 96%   BMI 29.31 kg/m   Wt Readings from Last 3 Encounters:  02/21/18 160 lb 4 oz (72.7 kg)  01/12/18 165 lb (74.8 kg)  01/04/18 167 lb 4 oz (75.9 kg)    Physical Exam  Constitutional: She appears well-developed and well-nourished. No distress.  Cardiovascular: Normal rate and regular rhythm.  Murmur (2/6 systolic) heard. Pulmonary/Chest: Effort normal and breath sounds normal. No stridor. No respiratory distress. She has no wheezes. She has no rales.  Musculoskeletal: She exhibits no edema.  Psychiatric: She has a normal mood and affect.  Nursing note and vitals reviewed.  Results for orders placed or performed in visit on 02/15/18  T4, free  Result Value Ref Range   Free T4 1.02 0.60 - 1.60 ng/dL  TSH  Result Value Ref Range   TSH 0.88 0.35 - 4.50 uIU/mL      Assessment & Plan:   Problem List Items Addressed This Visit    Irritable bowel syndrome with constipation    Ongoing trouble with IBS predominant constipation.  Discussed low FODMAP diet, handout provided. rec avoid all high FODMAP foods.  RTC 2-3 mo f/u visit.       Relevant Medications   omeprazole (PRILOSEC) 20 MG capsule   Hypothyroidism    TFTs at goal - continue current dose.       Chest congestion - Primary    Ongoing after CAP last month but benign physical exam today - recommended restart mucinex BID. Cough may be coming from GERD - recommended trial omeprazole 20mg  daily x 2 wks then update with effect.           Meds ordered this encounter  Medications  . omeprazole (PRILOSEC) 20 MG capsule    Sig: Take 1 capsule (20 mg total) by mouth daily.    Dispense:  20  capsule    Refill:  0  . furosemide (LASIX) 20 MG tablet    Sig: TAKE 1 TABLET BY MOUTH ONCE DAILY AS NEEDED FOR  EDEMA    Dispense:  30 tablet    Refill:  3  . potassium chloride (K-DUR) 10 MEQ tablet    Sig: Take 1 tablet (10 mEq total) by mouth daily as needed (with lasix).    Dispense:  30 tablet    Refill:  3   No orders of the defined types were placed in this encounter.   Follow up plan: Return in about 3 months (around 05/24/2018) for follow up visit.  [name], MD

## 2018-02-21 ENCOUNTER — Ambulatory Visit (INDEPENDENT_AMBULATORY_CARE_PROVIDER_SITE_OTHER): Payer: PPO | Admitting: Family Medicine

## 2018-02-21 ENCOUNTER — Encounter: Payer: Self-pay | Admitting: Family Medicine

## 2018-02-21 VITALS — BP 120/70 | HR 64 | Temp 97.6°F | Ht 62.0 in | Wt 160.2 lb

## 2018-02-21 DIAGNOSIS — K581 Irritable bowel syndrome with constipation: Secondary | ICD-10-CM

## 2018-02-21 DIAGNOSIS — E039 Hypothyroidism, unspecified: Secondary | ICD-10-CM | POA: Diagnosis not present

## 2018-02-21 DIAGNOSIS — R0989 Other specified symptoms and signs involving the circulatory and respiratory systems: Secondary | ICD-10-CM | POA: Insufficient documentation

## 2018-02-21 MED ORDER — FUROSEMIDE 20 MG PO TABS: ORAL_TABLET | ORAL | 3 refills | Status: DC

## 2018-02-21 MED ORDER — OMEPRAZOLE 20 MG PO CPDR: 20.0000 mg | DELAYED_RELEASE_CAPSULE | Freq: Every day | ORAL | 0 refills | Status: DC

## 2018-02-21 MED ORDER — POTASSIUM CHLORIDE ER 10 MEQ PO TBCR: 10.0000 meq | EXTENDED_RELEASE_TABLET | Freq: Every day | ORAL | 3 refills | Status: DC | PRN

## 2018-02-21 NOTE — Patient Instructions (Addendum)
For cough- restart plain mucinex with water to mobilize mucous Take omeprazole 20mg  daily for 2 weeks as GERD could contribute to cough.   Diet for Irritable Bowel Syndrome When you have irritable bowel syndrome (IBS), the foods you eat and your eating habits are very important. IBS may cause various symptoms, such as abdominal pain, constipation, or diarrhea. Choosing the right foods can help ease discomfort caused by these symptoms. Work with your health care provider and dietitian to find the best eating plan to help control your symptoms. What general guidelines do I need to follow?  Keep a food diary. This will help you identify foods that cause symptoms. Write down: ? What you eat and when. ? What symptoms you have. ? When symptoms occur in relation to your meals.  Avoid foods that cause symptoms. Talk with your dietitian about other ways to get the same nutrients that are in these foods.  Eat more foods that contain fiber. Take a fiber supplement if directed by your dietitian.  Eat your meals slowly, in a relaxed setting.  Aim to eat 5-6 small meals per day. Do not skip meals.  Drink enough fluids to keep your urine clear or pale yellow.  Ask your health care provider if you should take an over-the-counter probiotic during flare-ups to help restore healthy gut bacteria.  If you have cramping or diarrhea, try making your meals low in fat and high in carbohydrates. Examples of carbohydrates are pasta, rice, whole grain breads and cereals, fruits, and vegetables.  If dairy products cause your symptoms to flare up, try eating less of them. You might be able to handle yogurt better than other dairy products because it contains bacteria that help with digestion. What foods are not recommended? The following are some foods and drinks that may worsen your symptoms:  Fatty foods, such as [place] fries.  Milk products, such as cheese or ice cream.  Chocolate.  Alcohol.  Products  with caffeine, such as coffee.  Carbonated drinks, such as soda.  The items listed above may not be a complete list of foods and beverages to avoid. Contact your dietitian for more information. What foods are good sources of fiber? Your health care provider or dietitian may recommend that you eat more foods that contain fiber. Fiber can help reduce constipation and other IBS symptoms. Add foods with fiber to your diet a little at a time so that your body can get used to them. Too much fiber at once might cause gas and swelling of your abdomen. The following are some foods that are good sources of fiber:  Apples.  Peaches.  Pears.  Berries.  Figs.  Broccoli (raw).  Cabbage.  Carrots.  Raw peas.  Kidney beans.  Lima beans.  Whole grain bread.  Whole grain cereal.  Where to find more information: [employer] for Functional Gastrointestinal Disorders: www.iffgd.[employer] of Diabetes and Digestive and Kidney Diseases: NetworkAffair.co.za.aspx This information is not intended to replace advice given to you by your health care provider. Make sure you discuss any questions you have with your health care provider. Document Released: 07/03/2003 Document Revised: 09/18/2015 Document Reviewed: 07/13/2013 Elsevier Interactive Patient Education  2018 [employer].

## 2018-02-21 NOTE — Assessment & Plan Note (Signed)
TFTs at goal - continue current dose.

## 2018-02-21 NOTE — Assessment & Plan Note (Signed)
Ongoing trouble with IBS predominant constipation.  Discussed low FODMAP diet, handout provided. rec avoid all high FODMAP foods.  RTC 2-3 mo f/u visit.

## 2018-02-21 NOTE — Assessment & Plan Note (Signed)
Ongoing after CAP last month but benign physical exam today - recommended restart mucinex BID. Cough may be coming from GERD - recommended trial omeprazole 20mg  daily x 2 wks then update with effect.

## 2018-03-05 ENCOUNTER — Other Ambulatory Visit: Payer: Self-pay | Admitting: Family Medicine

## 2018-03-06 NOTE — Telephone Encounter (Signed)
# #  Electronic refill request Clonazepam Last office visit 02/21/18 Last refill 11/06/17 #30/3

## 2018-03-06 NOTE — Telephone Encounter (Signed)
E perscribed 

## 2018-03-10 ENCOUNTER — Encounter: Payer: Self-pay | Admitting: Family Medicine

## 2018-03-20 MED ORDER — OMEPRAZOLE 20 MG PO CPDR: 20.0000 mg | DELAYED_RELEASE_CAPSULE | Freq: Every day | ORAL | 6 refills | Status: DC

## 2018-03-20 NOTE — Addendum Note (Signed)
Addended by: [name] on: 03/20/2018 08:00 AM   Modules accepted: Orders

## 2018-03-30 DIAGNOSIS — H401131 Primary open-angle glaucoma, bilateral, mild stage: Secondary | ICD-10-CM | POA: Diagnosis not present

## 2018-04-14 ENCOUNTER — Encounter: Payer: Self-pay | Admitting: Family Medicine

## 2018-04-14 DIAGNOSIS — R1030 Lower abdominal pain, unspecified: Secondary | ICD-10-CM

## 2018-04-14 DIAGNOSIS — K581 Irritable bowel syndrome with constipation: Secondary | ICD-10-CM

## 2018-04-14 DIAGNOSIS — R194 Change in bowel habit: Secondary | ICD-10-CM

## 2018-05-25 ENCOUNTER — Ambulatory Visit (INDEPENDENT_AMBULATORY_CARE_PROVIDER_SITE_OTHER): Payer: PPO | Admitting: Family Medicine

## 2018-05-25 ENCOUNTER — Encounter: Payer: Self-pay | Admitting: Family Medicine

## 2018-05-25 VITALS — BP 126/70 | HR 80 | Temp 97.6°F | Ht 62.0 in | Wt 161.2 lb

## 2018-05-25 DIAGNOSIS — K219 Gastro-esophageal reflux disease without esophagitis: Secondary | ICD-10-CM | POA: Diagnosis not present

## 2018-05-25 DIAGNOSIS — K5909 Other constipation: Secondary | ICD-10-CM

## 2018-05-25 DIAGNOSIS — K581 Irritable bowel syndrome with constipation: Secondary | ICD-10-CM

## 2018-05-25 MED ORDER — OMEPRAZOLE 20 MG PO CPDR: 20.0000 mg | DELAYED_RELEASE_CAPSULE | Freq: Every day | ORAL | 3 refills | Status: DC

## 2018-05-25 NOTE — Assessment & Plan Note (Signed)
Requested referral to Dr Marius Ditch.  We have previously discussed restrictive low FODMAP diet.  ?SBO.

## 2018-05-25 NOTE — Progress Notes (Signed)
BP 126/70 (BP Location: Left Arm, Patient Position: Sitting, Cuff Size: Normal)   Pulse 80   Temp 97.6 F (36.4 C) (Oral)   Ht 5\' 2"  (1.575 m)   Wt 161 lb 4 oz (73.1 kg)   SpO2 97%   BMI 29.49 kg/m    CC: 3 mo f/u visit Subjective:    Patient ID: Brooke Beasley, female    DOB: [birth year], 85 y.o.   MRN: [id number]  HPI: Brooke Beasley is a 85 y.o. female presenting on 05/25/2018 for Irritable Bowel Syndrome (Here for 3 mo f/u.)   H/o chronic severe IBS predominant constipation treated with metamucil, powder pudding. She also takes daily phillips colon health probiotic and stool softener and dulcolax PRN. Persistent constipation. Not using miralax regularly  She has been taking omeprazole 3m daily with perceived benefit. Has noted improved cough, improved heartburn/acid reflux.   Off and on abdominal pain. Passing gas well.   Per patient request, we placed referral to Dr Marius Ditch last month.      Relevant past medical, surgical, family and social history reviewed and updated as indicated. Interim medical history since our last visit reviewed. Allergies and medications reviewed and updated. Outpatient Medications Prior to Visit  Medication Sig Dispense Refill  . acetaminophen (TYLENOL) 500 MG tablet Take 500-1,000 mg by mouth at bedtime. Depends on pain    . albuterol (PROVENTIL HFA;VENTOLIN HFA) 108 (90 Base) MCG/ACT inhaler Inhale 2 puffs into the lungs every 6 (six) hours as needed for wheezing or shortness of breath. 1 Inhaler 0  . amLODipine (NORVASC) 10 MG tablet Take 1 tablet (10 mg total) by mouth daily. 90 tablet 3  . benzonatate (TESSALON) 100 MG capsule Take 1 capsule (100 mg total) by mouth 3 (three) times daily as needed for cough. 30 capsule 0  . bimatoprost (LUMIGAN) 0.01 % SOLN Place 1 drop into both eyes at bedtime.    Marland Kitchen Bioflavonoid Products (VITAMIN C) CHEW Chew 1 tablet by mouth daily.    . cholecalciferol (VITAMIN D) 1000 units tablet Take 1,000  Units by mouth daily.    . clonazePAM (KLONOPIN) 0.5 MG tablet TAKE 1 TABLET BY MOUTH AT BEDTIME AS NEEDED FOR ANXIETY 30 tablet 3  . colchicine 0.6 MG tablet Take 1 tablet by mouth daily as needed 90 tablet 0  . Diclofenac Sodium CR (VOLTAREN-XR) 100 MG 24 hr tablet Take 1 tablet (100 mg total) by mouth daily. 10 tablet 0  . docusate sodium (COLACE) 100 MG capsule Take 1 capsule (100 mg total) by mouth 2 (two) times daily.  0  . famotidine (PEPCID) 20 MG tablet Take 1 tablet (20 mg total) by mouth daily as needed for heartburn or indigestion.    . fish oil-omega-3 fatty acids 1000 MG capsule Take 1 g by mouth daily.     . furosemide (LASIX) 20 MG tablet TAKE 1 TABLET BY MOUTH ONCE DAILY AS NEEDED FOR  EDEMA 30 tablet 3  . hydrocortisone (ANUSOL-HC) 25 MG suppository Place 1 suppository (25 mg total) rectally 2 (two) times daily. 12 suppository 2  . levothyroxine (SYNTHROID, LEVOTHROID) 100 MCG tablet Take 1 tablet (100 mcg total) by mouth daily. 30 tablet 11  . Misc Natural Products (OSTEO BI-FLEX TRIPLE STRENGTH PO) Take 1 tablet by mouth daily. With Vit D    . potassium chloride (K-DUR) 10 MEQ tablet Take 1 tablet (10 mEq total) by mouth daily as needed (with lasix). 30 tablet 3  . Pramox-PE-Glycerin-Petrolatum (PREPARATION H)  1-0.25-14.4-15 % CREA Use as directed 26 g 0  . Psyllium (METAMUCIL) 30.9 % POWD daily    . sertraline (ZOLOFT) 100 MG tablet Take 1 tablet by mouth daily 90 tablet 2  . simethicone (GAS-X) 80 MG chewable tablet Chew 1 tablet (80 mg total) by mouth 2 (two) times daily.    . TURMERIC PO Take 1 tablet by mouth daily.    Marland Kitchen VITAMIN E PO Take 1 tablet by mouth daily.    . VOLTAREN 1 % GEL APPLY ONE APPLICATION TOPICALLY 3 TIMES DAILY (Patient taking differently: APPLY ONE APPLICATION TOPICALLY daily as needed for shoulder pain) 100 g 3  . omeprazole (PRILOSEC) 20 MG capsule Take 1 capsule (20 mg total) by mouth daily. 30 capsule 6  . pravastatin (PRAVACHOL) 20 MG tablet Take  1 tablet (20 mg total) by mouth daily. (Patient not taking: Reported on 05/25/2018) 90 tablet 3   No facility-administered medications prior to visit.      Per HPI unless specifically indicated in ROS section below Review of Systems Objective:    BP 126/70 (BP Location: Left Arm, Patient Position: Sitting, Cuff Size: Normal)   Pulse 80   Temp 97.6 F (36.4 C) (Oral)   Ht 5\' 2"  (1.575 m)   Wt 161 lb 4 oz (73.1 kg)   SpO2 97%   BMI 29.49 kg/m   Wt Readings from Last 3 Encounters:  05/25/18 161 lb 4 oz (73.1 kg)  02/21/18 160 lb 4 oz (72.7 kg)  01/12/18 165 lb (74.8 kg)    Physical Exam Vitals signs and nursing note reviewed.  Constitutional:      Appearance: Normal appearance. She is not ill-appearing.  HENT:     Mouth/Throat:     Mouth: Mucous membranes are moist.     Pharynx: Oropharynx is clear.  Cardiovascular:     Rate and Rhythm: Normal rate and regular rhythm.     Pulses: Normal pulses.     Heart sounds: Murmur (3/6 systolic) present.  Pulmonary:     Effort: Pulmonary effort is normal. No respiratory distress.     Breath sounds: Normal breath sounds. No wheezing, rhonchi or rales.  Abdominal:     General: Abdomen is flat. Bowel sounds are normal. There is no distension.     Palpations: Abdomen is soft. There is no mass.     Tenderness: There is no abdominal tenderness. There is no right CVA tenderness, left CVA tenderness, guarding or rebound.     Hernia: No hernia is present.  Neurological:     Mental Status: She is alert.  Psychiatric:        Mood and Affect: Mood normal.       Results for orders placed or performed in visit on 02/15/18  T4, free  Result Value Ref Range   Free T4 1.02 0.60 - 1.60 ng/dL  TSH  Result Value Ref Range   TSH 0.88 0.35 - 4.50 uIU/mL   Lab Results  Component Value Date   CREATININE 1.09 12/19/2017   BUN 15 12/19/2017   NA 135 12/19/2017   K 3.7 12/19/2017   CL 98 12/19/2017   CO2 29 12/19/2017    Assessment & Plan:    Problem List Items Addressed This Visit    Irritable bowel syndrome with constipation    Requested referral to Dr Marius Ditch.  We have previously discussed restrictive low FODMAP diet.  ?SBO.       Relevant Medications   omeprazole (PRILOSEC) 20 MG  capsule   GERD (gastroesophageal reflux disease)    Overall doing better on daily omeprazole 20mg  - continue.       Relevant Medications   omeprazole (PRILOSEC) 20 MG capsule   Chronic constipation - Primary    Reviewed bowel regimen - metamucil, powder pudding, colon health probiotic and stool softener, with dulcolax PRN. Suggested add miralax more regularly 1/2-1 capful daily.           Meds ordered this encounter  Medications  . omeprazole (PRILOSEC) 20 MG capsule    Sig: Take 1 capsule (20 mg total) by mouth daily.    Dispense:  90 capsule    Refill:  3   No orders of the defined types were placed in this encounter.   Patient Instructions  Continue omeprazole 20mg  daily - as it seems to help cough and heartburn symptoms. Restart miralax 1/2-1 capful daily as long as not worsening abdominal pain.  Keep appointment with Dr Marius Ditch.    Follow up plan: Return if symptoms worsen or fail to improve.  [name], MD

## 2018-05-25 NOTE — Patient Instructions (Addendum)
Continue omeprazole 20mg  daily - as it seems to help cough and heartburn symptoms. Restart miralax 1/2-1 capful daily as long as not worsening abdominal pain.  Keep appointment with Dr Marius Ditch.

## 2018-05-25 NOTE — Assessment & Plan Note (Signed)
Reviewed bowel regimen - metamucil, powder pudding, colon health probiotic and stool softener, with dulcolax PRN. Suggested add miralax more regularly 1/2-1 capful daily.

## 2018-05-25 NOTE — Assessment & Plan Note (Signed)
Overall doing better on daily omeprazole 20mg  - continue.

## 2018-05-28 ENCOUNTER — Encounter (HOSPITAL_BASED_OUTPATIENT_CLINIC_OR_DEPARTMENT_OTHER): Payer: Self-pay | Admitting: Emergency Medicine

## 2018-05-28 ENCOUNTER — Other Ambulatory Visit: Payer: Self-pay

## 2018-05-28 ENCOUNTER — Emergency Department (HOSPITAL_BASED_OUTPATIENT_CLINIC_OR_DEPARTMENT_OTHER)
Admission: EM | Admit: 2018-05-28 | Discharge: 2018-05-28 | Disposition: A | Discharge status: Home / Self Care | Payer: PPO | Attending: Emergency Medicine | Admitting: Emergency Medicine

## 2018-05-28 DIAGNOSIS — M7501 Adhesive capsulitis of right shoulder: Secondary | ICD-10-CM | POA: Insufficient documentation

## 2018-05-28 DIAGNOSIS — N183 Chronic kidney disease, stage 3 (moderate): Secondary | ICD-10-CM | POA: Diagnosis not present

## 2018-05-28 DIAGNOSIS — I129 Hypertensive chronic kidney disease with stage 1 through stage 4 chronic kidney disease, or unspecified chronic kidney disease: Secondary | ICD-10-CM | POA: Diagnosis not present

## 2018-05-28 DIAGNOSIS — Z79899 Other long term (current) drug therapy: Secondary | ICD-10-CM | POA: Insufficient documentation

## 2018-05-28 DIAGNOSIS — Z96612 Presence of left artificial shoulder joint: Secondary | ICD-10-CM | POA: Diagnosis not present

## 2018-05-28 DIAGNOSIS — E039 Hypothyroidism, unspecified: Secondary | ICD-10-CM | POA: Diagnosis not present

## 2018-05-28 DIAGNOSIS — M25511 Pain in right shoulder: Secondary | ICD-10-CM | POA: Diagnosis present

## 2018-05-28 MED ORDER — PREDNISONE 20 MG PO TABS: ORAL_TABLET | ORAL | 0 refills | Status: DC

## 2018-05-28 MED ORDER — KETOROLAC TROMETHAMINE 60 MG/2ML IM SOLN
30.0000 mg | Freq: Once | INTRAMUSCULAR | Status: AC
  Administered 2018-05-28: 30 mg via INTRAMUSCULAR
  Filled 2018-05-28: qty 2

## 2018-05-28 MED ORDER — METHOCARBAMOL 500 MG PO TABS
1000.0000 mg | ORAL_TABLET | Freq: Once | ORAL | Status: AC
  Administered 2018-05-28: 1000 mg via ORAL
  Filled 2018-05-28: qty 2

## 2018-05-28 MED ORDER — METHOCARBAMOL 500 MG PO TABS: 500.0000 mg | ORAL_TABLET | Freq: Four times a day (QID) | ORAL | 0 refills | Status: DC | PRN

## 2018-05-28 MED ORDER — HYDROCODONE-ACETAMINOPHEN 5-325 MG PO TABS: 1.0000 | ORAL_TABLET | Freq: Four times a day (QID) | ORAL | 0 refills | Status: DC | PRN

## 2018-05-28 NOTE — ED Provider Notes (Signed)
[facility] EMER[facility]ENCY DEPARTMENT Provider Note   CSN: [id number] Arrival date & time: 05/28/18  3790     History   Chief Complaint Chief Complaint  Patient presents with  . Shoulder Pain    HPI Brooke Beasley is a 85 y.o. female.  HPI Patient reports that she has had history of similar shoulder pain almost 1 year ago to the date.  2 days ago the right shoulder started getting slightly stiff.  She went to bed last night and this morning awakened with severe pain in the right shoulder.  She reports even small movements are excruciatingly painful.  No associated injury. Past Medical History:  Diagnosis Date  . Anxiety   . CAP (community acquired pneumonia) 12/22/2017  . Carotid stenosis 10/18/2014   R 1-39%, L 40-59%, rpt 1 yr (09/2014)   . CKD (chronic kidney disease) stage 3, GFR 30-59 ml/min (HCC) 08/02/2014  . Degenerative disc disease    LS  . Depression    nervous breakdonw in 1964-out of work for a year  . Diverticulosis    severe by colonoscopy  . Family history of adverse reaction to anesthesia    n/v  . GERD (gastroesophageal reflux disease)   . Glaucoma   . Heart murmur 5/13   mitral regurge - on echo   . History of hiatal hernia   . History of shingles   . HTN (hypertension)   . Hyperlipidemia   . Hypertension   . Hypothyroidism   . IBS 11/02/2006  . Intestinal bacterial overgrowth    In small colon  . Maxillary fracture ([place]) 04/08/2012  . Osteoarthritis 2016   Jef[facility])  . Osteoporosis    dexa 2011  . Pseudogout 2016   shoulders ([place])    Patient Active Problem List   Diagnosis Date Noted  . Chest congestion 02/21/2018  . Right flank pain 10/20/2017  . Pedal edema 12/29/2016  . BRBPR (bright red blood per rectum) 04/09/2016  . Weakness 04/09/2016  . Sleep disturbance 04/09/2016  . Bowel habit changes 01/13/2016  . Hemorrhoids, external 01/13/2016  . Status post total shoulder replacement 11/27/2015  . Health maintenance  examination 10/07/2015  . Pseudogout   . Left shoulder pain 11/04/2014  . Carotid stenosis 10/18/2014  . Advanced care planning/counseling discussion 09/26/2014  . Diverticulosis of colon without hemorrhage 09/26/2014  . CKD (chronic kidney disease) stage 3, GFR 30-59 ml/min (HCC) 08/02/2014  . Restless legs 07/04/2014  . Dizziness 06/13/2014  . Lower abdominal pain 01/07/2014  . Primary osteoarthritis of both hands 11/21/2013  . Insomnia 10/23/2013  . Medicare annual wellness visit, subsequent 06/08/2013  . GERD (gastroesophageal reflux disease) 04/02/2013  . Anxiety associated with depression 12/26/2012  . Prediabetes 03/20/2012  . Heart murmur 09/13/2011  . Syncope 08/02/2011  . HYPERTENSION, BENIGN 02/24/2007  . Hypothyroidism 11/02/2006  . HLD (hyperlipidemia) 11/02/2006  . Glaucoma 11/02/2006  . Internal hemorrhoids 11/02/2006  . Chronic constipation 11/02/2006  . Irritable bowel syndrome with constipation 11/02/2006  . Osteoporosis 11/02/2006  . Osteoarthritis 10/20/2006    Past Surgical History:  Procedure Laterality Date  . barium enema  2012   severe diverticulosis, redundant colon  . Bowel obstruction  1999   no surgery in hosp x 3 days  . COLONOSCOPY  1. 1999  2. 11/04   1. Not finished  2. Slight hemorrhage rectosigmoid area, severe sig diverticulosis  . Dexa  1. 2409-7353   2. 9/04  3. 3/08    1. OP  2. OP, borderline, spine -2.44T  3. decreased BMD-OP  . DEXA  2011   OP in spine  . DG KNEE 1-2 VIEWS BILAT     LS x-ray with degenerative disc and facet change  . ESOPHAGOGASTRODUODENOSCOPY     Negative  . Hemorrhoid procedure  4/08  . JOINT REPLACEMENT    . TONSILLECTOMY    . TOTAL SHOULDER ARTHROPLASTY Left 11/27/2015   Corky Mull, MD  . TOTAL SHOULDER REVISION Left 03/16/2016   Procedure: TOTAL SHOULDER REVISION;  Surgeon: Corky Mull, MD;  Location: [facility] ORS;  Service: Orthopedics;  Laterality: Left;  . US ECHOCARDIOGRAPHY  10/1243   Normal systolic  fxn with EF 80-99%.  Focal basal septal hypertrophy.  Mild diastolic dysfunction.  Mild MR.     OB History   No obstetric history on file.      Home Medications    Prior to Admission medications   Medication Sig Start Date End Date Taking? Authorizing Provider  acetaminophen (TYLENOL) 500 MG tablet Take 500-1,000 mg by mouth at bedtime. Depends on pain    [provider]  albuterol (PROVENTIL HFA;VENTOLIN HFA) 108 (90 Base) MCG/ACT inhaler Inhale 2 puffs into the lungs every 6 (six) hours as needed for wheezing or shortness of breath. 01/12/18   [name], MD  amLODipine (NORVASC) 10 MG tablet Take 1 tablet (10 mg total) by mouth daily. 10/17/17   [name], MD  benzonatate (TESSALON) 100 MG capsule Take 1 capsule (100 mg total) by mouth 3 (three) times daily as needed for cough. 12/29/17   [name], MD  bimatoprost (LUMIGAN) 0.01 % SOLN Place 1 drop into both eyes at bedtime.    [provider]  Bioflavonoid Products (VITAMIN C) CHEW Chew 1 tablet by mouth daily.    [provider]  cholecalciferol (VITAMIN D) 1000 units tablet Take 1,000 Units by mouth daily.    [provider]  clonazePAM (KLONOPIN) 0.5 MG tablet TAKE 1 TABLET BY MOUTH AT BEDTIME AS NEEDED FOR ANXIETY 03/06/18   [name], MD  colchicine 0.6 MG tablet Take 1 tablet by mouth daily as needed 09/30/16   [name], MD  Diclofenac Sodium CR (VOLTAREN-XR) 100 MG 24 hr tablet Take 1 tablet (100 mg total) by mouth daily. 05/27/17   Palumbo, April, MD  docusate sodium (COLACE) 100 MG capsule Take 1 capsule (100 mg total) by mouth 2 (two) times daily. 02/11/17   [name], MD  famotidine (PEPCID) 20 MG tablet Take 1 tablet (20 mg total) by mouth daily as needed for heartburn or indigestion. 02/11/17   [name], MD  fish oil-omega-3 fatty acids 1000 MG capsule Take 1 g by mouth daily.     [provider]  furosemide (LASIX) 20 MG tablet  TAKE 1 TABLET BY MOUTH ONCE DAILY AS NEEDED FOR  EDEMA 02/21/18   [name], MD  HYDROcodone-acetaminophen (NORCO/VICODIN) 5-325 MG tablet Take 1-2 tablets by mouth every 6 (six) hours as needed for moderate pain or severe pain. 05/28/18   Charlesetta Shanks, MD  hydrocortisone (ANUSOL-HC) 25 MG suppository Place 1 suppository (25 mg total) rectally 2 (two) times daily. 12/22/17   [name], MD  levothyroxine (SYNTHROID, LEVOTHROID) 100 MCG tablet Take 1 tablet (100 mcg total) by mouth daily. 12/22/17   [name], MD  methocarbamol (ROBAXIN) 500 MG tablet Take 1 tablet (500 mg total) by mouth every 6 (six) hours as needed for muscle spasms. 05/28/18   Charlesetta Shanks, MD  Misc Natural Products (OSTEO BI-FLEX TRIPLE STRENGTH PO) Take 1 tablet by mouth daily. With Vit D    [provider]  omeprazole (PRILOSEC) 20 MG capsule Take 1 capsule (20 mg total) by mouth daily. 05/25/18   [name], MD  potassium chloride (K-DUR) 10 MEQ tablet Take 1 tablet (10 mEq total) by mouth daily as needed (with lasix). 02/21/18   [name], MD  Pramox-PE-Glycerin-Petrolatum (PREPARATION H) 1-0.25-14.4-15 % CREA Use as directed 01/13/16   [name], MD  pravastatin (PRAVACHOL) 20 MG tablet Take 1 tablet (20 mg total) by mouth daily. Patient not taking: Reported on 05/25/2018 12/29/16   [name], MD  predniSONE (DELTASONE) 20 MG tablet 2 tabs po daily x 3 days 05/28/18   Charlesetta Shanks, MD  Psyllium (METAMUCIL) 30.9 % POWD daily 02/11/17   [name], MD  sertraline (ZOLOFT) 100 MG tablet Take 1 tablet by mouth daily 01/25/18   [name], MD  simethicone (GAS-X) 80 MG chewable tablet Chew 1 tablet (80 mg total) by mouth 2 (two) times daily. 04/15/17   [name], MD  TURMERIC PO Take 1 tablet by mouth daily.    [provider]  VITAMIN E PO Take 1 tablet by mouth daily.    [provider]  VOLTAREN 1 % GEL APPLY ONE APPLICATION  TOPICALLY 3 TIMES DAILY Patient taking differently: APPLY ONE APPLICATION TOPICALLY daily as needed for shoulder pain 09/26/14   [name], MD    Family History Family History  Problem Relation Age of Onset  . Diabetes Brother        post-op  . Coronary artery disease Brother   . Diabetes Paternal Grandfather   . Breast cancer Cousin     Social History Social History   Tobacco Use  . Smoking status: Never Smoker  . Smokeless tobacco: Never Used  Substance Use Topics  . Alcohol use: No  . Drug use: No     Allergies   Prednisone; Ace inhibitors; Alendronate sodium; Losartan; Other; Paroxetine hcl; Risedronate sodium; Silenor [doxepin hcl]; Simvastatin; Sulfonamide derivatives; Tramadol; and Influenza vaccines   Review of Systems Review of Systems 10 Systems reviewed and are negative for acute change except as noted in the HPI.  Physical Exam Updated Vital Signs BP (!) 186/79 (BP Location: Left Arm)   Pulse 71   Temp (!) 97.5 F (36.4 C) (Oral)   Resp 18   Ht 5\' 2"  (1.575 m)   Wt 73 kg   SpO2 96%   BMI 29.44 kg/m   Physical Exam Constitutional:      Comments: Alert nontoxic.  No respiratory distress.  Severe pain.  HENT:     Head: Normocephalic and atraumatic.  Eyes:     Extraocular Movements: Extraocular movements intact.  Cardiovascular:     Rate and Rhythm: Normal rate and regular rhythm.     Pulses: Normal pulses.  Pulmonary:     Effort: Pulmonary effort is normal.     Breath sounds: Normal breath sounds.  Abdominal:     General: There is no distension.     Palpations: Abdomen is soft.  Musculoskeletal:     Comments: Normal alignment of right shoulder.  Severe pain with any range of motion at the glenohumeral joint.  Neurovascularly intact with hand warm and dry.  No lower extremity edema.  Calves soft and nontender.  Skin:    General: Skin is warm and dry.  Neurological:     General: No focal deficit present.  Mental Status: She is  oriented to person, place, and time.     Coordination: Coordination normal.  Psychiatric:        Mood and Affect: Mood normal.      ED Treatments / Results  Labs (all labs ordered are listed, but only abnormal results are displayed) Labs Reviewed - No data to display  EKG None  Radiology No results found.  Procedures Procedures (including critical care time)  Medications Ordered in ED Medications  ketorolac (TORADOL) injection 30 mg (30 mg Intramuscular Given 05/28/18 0756)  methocarbamol (ROBAXIN) tablet 1,000 mg (1,000 mg Oral Given 05/28/18 0756)     Initial Impression / Assessment and Plan / ED Course  I have reviewed the triage vital signs and the nursing notes.  Pertinent labs & imaging results that were available during my care of the patient were reviewed by me and considered in my medical decision making (see chart for details).    Patient had same episode 1 year ago.  At that time she had adhesive capsulitis.  Symptoms improved after treatment.  At this time no evidence of neurovascular dysfunction.  Patient is improving with Toradol and Robaxin.  Plan for pain control reviewed with the patient and her daughter.  They will follow-up with orthopedics.  Final Clinical Impressions(s) / ED Diagnoses   Final diagnoses:  Adhesive capsulitis of right shoulder    ED Discharge Orders         Ordered    predniSONE (DELTASONE) 20 MG tablet     05/28/18 0844    HYDROcodone-acetaminophen (NORCO/VICODIN) 5-325 MG tablet  Every 6 hours PRN     05/28/18 0844    methocarbamol (ROBAXIN) 500 MG tablet  Every 6 hours PRN     05/28/18 0844           Charlesetta Shanks, MD 05/28/18 [phone]

## 2018-05-28 NOTE — ED Notes (Signed)
ED Provider at bedside. 

## 2018-05-28 NOTE — ED Triage Notes (Signed)
R shoulder pain since yesterday, denies injury. History of arthritis.

## 2018-05-28 NOTE — Discharge Instructions (Signed)
1.  Schedule a follow-up appointment with your orthopedic doctor for the beginning of the week. 2.  Take prednisone 40 mg a day today, tomorrow and the next day.  This should help with inflammation and pain in your shoulder.  If by the third day, you still have pain that requires medication, take over-the-counter ibuprofen 400 mg up to 3 times a day with food on your stomach.  3.  You also have a prescription for Robaxin, a muscle relaxer.  Use this up to 4 times a day for additional pain control.  If Robaxin is not helping after 2-3 doses, discontinue the Robaxin and take 1-2 Vicodin tablets every 6 hours. Use Vicodin sparingly.  If you have any tendency towards constipation, take over-the-counter stool softener such as Colace to avoid constipation. 4.  Try to start shoulder exercises as soon as possible to avoid increasing stiffness

## 2018-05-31 DIAGNOSIS — M79641 Pain in right hand: Secondary | ICD-10-CM | POA: Diagnosis not present

## 2018-05-31 DIAGNOSIS — M19011 Primary osteoarthritis, right shoulder: Secondary | ICD-10-CM | POA: Diagnosis not present

## 2018-05-31 DIAGNOSIS — M25511 Pain in right shoulder: Secondary | ICD-10-CM | POA: Diagnosis not present

## 2018-06-05 ENCOUNTER — Encounter: Payer: Self-pay | Admitting: Gastroenterology

## 2018-06-05 ENCOUNTER — Ambulatory Visit (INDEPENDENT_AMBULATORY_CARE_PROVIDER_SITE_OTHER): Payer: PPO | Admitting: Gastroenterology

## 2018-06-05 VITALS — BP 113/73 | HR 76 | Resp 17 | Ht 62.0 in | Wt 160.2 lb

## 2018-06-05 DIAGNOSIS — K5909 Other constipation: Secondary | ICD-10-CM | POA: Diagnosis not present

## 2018-06-05 DIAGNOSIS — R140 Abdominal distension (gaseous): Secondary | ICD-10-CM | POA: Diagnosis not present

## 2018-06-05 MED ORDER — RIFAXIMIN 550 MG PO TABS: 550.0000 mg | ORAL_TABLET | Freq: Two times a day (BID) | ORAL | 0 refills | Status: DC

## 2018-06-05 NOTE — Patient Instructions (Signed)

## 2018-06-05 NOTE — Progress Notes (Signed)
Cephas Darby, MD [address]  [place]  [place], [place] [address]  Main: [phone]  Fax: [phone]    Gastroenterology Consultation  Referring Provider:     [name], MD Primary Care Physician:  [name], MD Primary Gastroenterologist:  Dr. Cephas Darby Reason for Consultation:     Constipation, abdominal pain and bloating        HPI:   Brooke Beasley is a 85 y.o. pleasant Caucasian female referred by Dr. [name], MD  for consultation & management of chronic constipation, abdominal pain and bloating.  She has history of hypothyroidism, physically active, independent of ADLs, still driving.  Patient is accompanied by her daughter today.  Patient reports that " all my life" have been dealing with constipation, abdominal pain and bloating.  She reports having both hard and loose watery bowel movements associated with incontinence, bright red blood per rectum, on wiping, hemorrhoids have been bothering as she spends up to 2030 minutes on toilet every day.  She also reports diffuse lower abdominal pain associated with significant bloating.  She had an attempted colonoscopy in 2005, could not advance the scope due to severe diverticula.  She had incomplete colonoscopy in 2012, underwent barium enema study which revealed redundant/tortuous pathway and colonic diverticulosis without other lesions.  She had a virtual colonoscopy in 2018 by Dr. Gustavo Lah which was limited due to presence of stool but showed severe diverticulosis.  She also underwent anorectal manometry which was normal, pelvic floor therapy.  Patient has been taking MiraLAX and Metamucil which does not provide much relief.  She has to wear a diaper due to overflow incontinence.  This has limited her going out.  She denies weight loss, but does have intermittent nausea.  She cannot tolerate solids, cabbage due to significant bloating.  TSH normal, no evidence of anemia She takes Vicodin  as needed for arm pain but not daily  NSAIDs: None  Antiplts/Anticoagulants/Anti thrombotics: None  GI Procedures: Flexible sigmoidoscopy, incomplete colonoscopy CT colonography  Past Medical History:  Diagnosis Date  . Anxiety   . CAP (community acquired pneumonia) 12/22/2017  . Carotid stenosis 10/18/2014   R 1-39%, L 40-59%, rpt 1 yr (09/2014)   . CKD (chronic kidney disease) stage 3, GFR 30-59 ml/min (HCC) 08/02/2014  . Degenerative disc disease    LS  . Depression    nervous breakdonw in 1964-out of work for a year  . Diverticulosis    severe by colonoscopy  . Family history of adverse reaction to anesthesia    n/v  . GERD (gastroesophageal reflux disease)   . Glaucoma   . Heart murmur 5/13   mitral regurge - on echo   . History of hiatal hernia   . History of shingles   . HTN (hypertension)   . Hyperlipidemia   . Hypertension   . Hypothyroidism   . IBS 11/02/2006  . Intestinal bacterial overgrowth    In small colon  . Maxillary fracture ([facility]) 04/08/2012  . Osteoarthritis 2016   Jefm Bryant)  . Osteoporosis    dexa 2011  . Pseudogout 2016   shoulders ([facility])    Past Surgical History:  Procedure Laterality Date  . barium enema  2012   severe diverticulosis, redundant colon  . Bowel obstruction  1999   no surgery in hosp x 3 days  . COLONOSCOPY  1. 1999  2. 11/04   1. Not finished  2. Slight hemorrhage rectosigmoid area, severe sig diverticulosis  .  Dexa  1. [id number]   2. 9/04  3. 3/08    1. OP  2. OP, borderline, spine -2.44T  3. decreased BMD-OP  . DEXA  2011   OP in spine  . DG KNEE 1-2 VIEWS BILAT     LS x-ray with degenerative disc and facet change  . ESOPHAGOGASTRODUODENOSCOPY     Negative  . Hemorrhoid procedure  4/08  . JOINT REPLACEMENT    . TONSILLECTOMY    . TOTAL SHOULDER ARTHROPLASTY Left 11/27/2015   Corky Mull, MD  . TOTAL SHOULDER REVISION Left 03/16/2016   Procedure: TOTAL SHOULDER REVISION;  Surgeon: Corky Mull, MD;  Location: [facility]  ORS;  Service: Orthopedics;  Laterality: Left;  . US ECHOCARDIOGRAPHY  09/7589   Normal systolic fxn with EF 63-84%.  Focal basal septal hypertrophy.  Mild diastolic dysfunction.  Mild MR.    Current Outpatient Medications:  .  acetaminophen (TYLENOL) 500 MG tablet, Take 500-1,000 mg by mouth at bedtime. Depends on pain, Disp: , Rfl:  .  amLODipine (NORVASC) 10 MG tablet, Take 1 tablet (10 mg total) by mouth daily., Disp: 90 tablet, Rfl: 3 .  benzonatate (TESSALON) 100 MG capsule, Take 1 capsule (100 mg total) by mouth 3 (three) times daily as needed for cough., Disp: 30 capsule, Rfl: 0 .  bimatoprost (LUMIGAN) 0.01 % SOLN, Place 1 drop into both eyes at bedtime., Disp: , Rfl:  .  Bioflavonoid Products (VITAMIN C) CHEW, Chew 1 tablet by mouth daily., Disp: , Rfl:  .  cholecalciferol (VITAMIN D) 1000 units tablet, Take 1,000 Units by mouth daily., Disp: , Rfl:  .  clonazePAM (KLONOPIN) 0.5 MG tablet, TAKE 1 TABLET BY MOUTH AT BEDTIME AS NEEDED FOR ANXIETY, Disp: 30 tablet, Rfl: 3 .  colchicine 0.6 MG tablet, Take 1 tablet by mouth daily as needed, Disp: 90 tablet, Rfl: 0 .  Diclofenac Sodium CR (VOLTAREN-XR) 100 MG 24 hr tablet, Take 1 tablet (100 mg total) by mouth daily., Disp: 10 tablet, Rfl: 0 .  docusate sodium (COLACE) 100 MG capsule, Take 1 capsule (100 mg total) by mouth 2 (two) times daily., Disp: , Rfl: 0 .  furosemide (LASIX) 20 MG tablet, TAKE 1 TABLET BY MOUTH ONCE DAILY AS NEEDED FOR  EDEMA, Disp: 30 tablet, Rfl: 3 .  HYDROcodone-acetaminophen (NORCO/VICODIN) 5-325 MG tablet, Take 1-2 tablets by mouth every 6 (six) hours as needed for moderate pain or severe pain., Disp: 20 tablet, Rfl: 0 .  hydrocortisone (ANUSOL-HC) 25 MG suppository, Place 1 suppository (25 mg total) rectally 2 (two) times daily., Disp: 12 suppository, Rfl: 2 .  levothyroxine (SYNTHROID, LEVOTHROID) 100 MCG tablet, Take 1 tablet (100 mcg total) by mouth daily., Disp: 30 tablet, Rfl: 11 .  Misc Natural Products  (OSTEO BI-FLEX TRIPLE STRENGTH PO), Take 1 tablet by mouth daily. With Vit D, Disp: , Rfl:  .  omeprazole (PRILOSEC) 20 MG capsule, Take 1 capsule (20 mg total) by mouth daily., Disp: 90 capsule, Rfl: 3 .  Pramox-PE-Glycerin-Petrolatum (PREPARATION H) 1-0.25-14.4-15 % CREA, Use as directed, Disp: 26 g, Rfl: 0 .  Psyllium (METAMUCIL) 30.9 % POWD, daily, Disp: , Rfl:  .  sertraline (ZOLOFT) 100 MG tablet, Take 1 tablet by mouth daily, Disp: 90 tablet, Rfl: 2 .  simethicone (GAS-X) 80 MG chewable tablet, Chew 1 tablet (80 mg total) by mouth 2 (two) times daily., Disp: , Rfl:  .  TURMERIC PO, Take 1 tablet by mouth daily., Disp: , Rfl:  .  VITAMIN E PO, Take 1 tablet by mouth daily., Disp: , Rfl:  .  VOLTAREN 1 % GEL, APPLY ONE APPLICATION TOPICALLY 3 TIMES DAILY (Patient taking differently: APPLY ONE APPLICATION TOPICALLY daily as needed for shoulder pain), Disp: 100 g, Rfl: 3 .  albuterol (PROVENTIL HFA;VENTOLIN HFA) 108 (90 Base) MCG/ACT inhaler, Inhale 2 puffs into the lungs every 6 (six) hours as needed for wheezing or shortness of breath. (Patient not taking: Reported on 06/05/2018), Disp: 1 Inhaler, Rfl: 0 .  famotidine (PEPCID) 20 MG tablet, Take 1 tablet (20 mg total) by mouth daily as needed for heartburn or indigestion. (Patient not taking: Reported on 06/05/2018), Disp: , Rfl:  .  fish oil-omega-3 fatty acids 1000 MG capsule, Take 1 g by mouth daily. , Disp: , Rfl:  .  methocarbamol (ROBAXIN) 500 MG tablet, Take 1 tablet (500 mg total) by mouth every 6 (six) hours as needed for muscle spasms. (Patient not taking: Reported on 06/05/2018), Disp: 20 tablet, Rfl: 0 .  potassium chloride (K-DUR) 10 MEQ tablet, Take 1 tablet (10 mEq total) by mouth daily as needed (with lasix). (Patient not taking: Reported on 06/05/2018), Disp: 30 tablet, Rfl: 3 .  pravastatin (PRAVACHOL) 20 MG tablet, Take 1 tablet (20 mg total) by mouth daily. (Patient not taking: Reported on 05/25/2018), Disp: 90 tablet, Rfl: 3 .   predniSONE (DELTASONE) 20 MG tablet, 2 tabs po daily x 3 days (Patient not taking: Reported on 06/05/2018), Disp: 6 tablet, Rfl: 0 .  rifaximin (XIFAXAN) 550 MG TABS tablet, Take 1 tablet (550 mg total) by mouth 2 (two) times daily for 14 days., Disp: 28 tablet, Rfl: 0   Family History  Problem Relation Age of Onset  . Diabetes Brother        post-op  . Coronary artery disease Brother   . Diabetes Paternal Grandfather   . Breast cancer Cousin      Social History   Tobacco Use  . Smoking status: Never Smoker  . Smokeless tobacco: Never Used  Substance Use Topics  . Alcohol use: No  . Drug use: No    Allergies as of 06/05/2018 - Review Complete 06/05/2018  Allergen Reaction Noted  . Prednisone Other (See Comments) 11/12/2015  . Ace inhibitors Other (See Comments) 04/02/2008  . Alendronate sodium  09/14/2006  . Losartan Other (See Comments) 09/26/2014  . Other Other (See Comments) 11/12/2015  . Paroxetine hcl Other (See Comments) 06/15/2011  . Risedronate sodium  03/13/2007  . Silenor [doxepin hcl] Other (See Comments) 04/02/2013  . Simvastatin  10/03/2009  . Sulfonamide derivatives  06/08/2006  . Tramadol Other (See Comments) 11/12/2015  . Influenza vaccines Rash and Other (See Comments) 05/24/2014    Review of Systems:    All systems reviewed and negative except where noted in HPI.   Physical Exam:  BP 113/73 (BP Location: Left Arm, Patient Position: Sitting, Cuff Size: Large)   Pulse 76   Resp 17   Ht 5\' 2"  (1.575 m)   Wt 160 lb 3.2 oz (72.7 kg)   BMI 29.30 kg/m  No LMP recorded. Patient is postmenopausal.  General:   Alert,  Well-developed, well-nourished, pleasant and cooperative in NAD Head:  Normocephalic and atraumatic. Eyes:  Sclera clear, no icterus.   Conjunctiva pink. Ears:  Normal auditory acuity. Nose:  No deformity, discharge, or lesions. Mouth:  No deformity or lesions,oropharynx pink & moist. Neck:  Supple; no masses or thyromegaly. Lungs:   Respirations even and unlabored.  Clear throughout  to auscultation.   No wheezes, crackles, or rhonchi. No acute distress. Heart:  Regular rate and rhythm; no murmurs, clicks, rubs, or gallops. Abdomen:  Normal bowel sounds. Soft, non-tender and non-distended without masses, hepatosplenomegaly or hernias noted.  No guarding or rebound tenderness.   Rectal: Not performed Msk:  Symmetrical without gross deformities. Good, equal movement & strength bilaterally. Pulses:  Normal pulses noted. Extremities:  No clubbing or edema.  No cyanosis. Neurologic:  Alert and oriented x3;  grossly normal neurologically. Skin:  Intact without significant lesions or rashes. No jaundice. Lymph Nodes:  No significant cervical adenopathy. Psych:  Alert and cooperative. Normal mood and affect.  Imaging Studies: Reviewed  Assessment and Plan:   Margaree Sandhu is a 85 y.o. female with history of hypothyroidism, no other significant medical history, functionally independent, still driving seen in consultation for chronic constipation, abdominal bloating and abdominal pain.  Advised her to clean out with MiraLAX prep which she is willing to try Start Linzess 145 MCG after cleanout High-fiber diet Fiber supplements with soluble fiber Will empirically treat with rifaximin 550 mg twice daily for 2 weeks for possible bacterial overgrowth Advised her not to spend more than 5 minutes on toilet which she has a BM Anusol cream 2 times daily for 2 weeks Also, discussed with her that I am willing to attempt another colonoscopy with pediatric colonoscope are upper endoscope given her overall fairly good medical condition in near future   Follow up in 3 to 4 weeks   Cephas Darby, MD

## 2018-06-06 ENCOUNTER — Encounter: Payer: Self-pay | Admitting: Gastroenterology

## 2018-06-06 ENCOUNTER — Other Ambulatory Visit: Payer: Self-pay | Admitting: Gastroenterology

## 2018-06-06 DIAGNOSIS — R140 Abdominal distension (gaseous): Secondary | ICD-10-CM

## 2018-06-06 MED ORDER — METRONIDAZOLE 500 MG PO TABS: 500.0000 mg | ORAL_TABLET | Freq: Two times a day (BID) | ORAL | 0 refills | Status: AC

## 2018-06-06 MED ORDER — CIPROFLOXACIN HCL 500 MG PO TABS: 500.0000 mg | ORAL_TABLET | Freq: Two times a day (BID) | ORAL | 0 refills | Status: DC

## 2018-06-22 ENCOUNTER — Encounter: Payer: Self-pay | Admitting: Gastroenterology

## 2018-06-23 ENCOUNTER — Telehealth: Payer: Self-pay

## 2018-06-23 ENCOUNTER — Other Ambulatory Visit
Admission: RE | Admit: 2018-06-23 | Discharge: 2018-06-23 | Disposition: A | Discharge status: Home / Self Care | Payer: PPO | Source: Ambulatory Visit | Attending: Gastroenterology | Admitting: Gastroenterology

## 2018-06-23 ENCOUNTER — Other Ambulatory Visit: Payer: Self-pay

## 2018-06-23 ENCOUNTER — Telehealth: Payer: Self-pay | Admitting: Gastroenterology

## 2018-06-23 DIAGNOSIS — R197 Diarrhea, unspecified: Secondary | ICD-10-CM | POA: Diagnosis not present

## 2018-06-23 DIAGNOSIS — R509 Fever, unspecified: Secondary | ICD-10-CM | POA: Diagnosis not present

## 2018-06-23 LAB — COMPREHENSIVE METABOLIC PANEL
ALT: 13 U/L (ref 0–44)
AST: 20 U/L (ref 15–41)
Albumin: 3.6 g/dL (ref 3.5–5.0)
Alkaline Phosphatase: 66 U/L (ref 38–126)
Anion gap: 9 (ref 5–15)
BUN: 13 mg/dL (ref 8–23)
CO2: 24 mmol/L (ref 22–32)
Calcium: 8.4 mg/dL — ABNORMAL LOW (ref 8.9–10.3)
Chloride: 98 mmol/L (ref 98–111)
Creatinine, Ser: 1 mg/dL (ref 0.44–1.00)
GFR calc Af Amer: 59 mL/min — ABNORMAL LOW (ref >60)
GFR calc non Af Amer: 51 mL/min — ABNORMAL LOW (ref >60)
Glucose, Bld: 117 mg/dL — ABNORMAL HIGH (ref 70–99)
Potassium: 3.4 mmol/L — ABNORMAL LOW (ref 3.5–5.1)
Sodium: 131 mmol/L — ABNORMAL LOW (ref 135–145)
Total Bilirubin: 0.8 mg/dL (ref 0.3–1.2)
Total Protein: 6.6 g/dL (ref 6.5–8.1)

## 2018-06-23 LAB — CBC WITH DIFFERENTIAL/PLATELET
Abs Immature Granulocytes: 0.03 10*3/uL (ref 0.00–0.07)
Basophils Absolute: 0 10*3/uL (ref 0.0–0.1)
Basophils Relative: 0 %
Eosinophils Absolute: 0.2 10*3/uL (ref 0.0–0.5)
Eosinophils Relative: 2 %
HCT: 39.6 % (ref 36.0–46.0)
Hemoglobin: 13.1 g/dL (ref 12.0–15.0)
Immature Granulocytes: 0 %
Lymphocytes Relative: 19 %
Lymphs Abs: 1.9 10*3/uL (ref 0.7–4.0)
MCH: 29.8 pg (ref 26.0–34.0)
MCHC: 33.1 g/dL (ref 30.0–36.0)
MCV: 90.2 fL (ref 80.0–100.0)
Monocytes Absolute: 1.5 10*3/uL — ABNORMAL HIGH (ref 0.1–1.0)
Monocytes Relative: 16 %
NRBC: 0 % (ref 0.0–0.2)
Neutro Abs: 6 10*3/uL (ref 1.7–7.7)
Neutrophils Relative %: 63 %
Platelets: 212 10*3/uL (ref 150–400)
RBC: 4.39 MIL/uL (ref 3.87–5.11)
RDW: 13.9 % (ref 11.5–15.5)
WBC: 9.6 10*3/uL (ref 4.0–10.5)

## 2018-06-23 MED ORDER — ONDANSETRON HCL 4 MG PO TABS: 4.0000 mg | ORAL_TABLET | Freq: Three times a day (TID) | ORAL | 0 refills | Status: DC | PRN

## 2018-06-23 MED ORDER — SERTRALINE HCL 100 MG PO TABS: 100.0000 mg | ORAL_TABLET | Freq: Every day | ORAL | 1 refills | Status: DC

## 2018-06-23 NOTE — Telephone Encounter (Signed)
Message forwarded to Dr. Marius Ditch for recommendation.

## 2018-06-23 NOTE — Telephone Encounter (Signed)
Returned patient's phone call.  She completed 10 days course of ciprofloxacin which he tolerated well.  She then started taking Linzess, took for 2 days, started with nonbloody watery diarrhea and had knee pain.  Had fever of 102 last night.  Received Tylenol.  Did not have fever since then.  She is having central abdominal cramps.  She had nausea and episode of vomiting yesterday.  She denies nausea or vomiting today, able to keep liquids and some soft food down.  She stopped Linzess, knee pain is improving and able to walk by herself.  Her history does not suggest tendinitis or tendon rupture as knee pain is already improving  I am worried about antibiotic associated C. difficile given her symptoms and knee pain could be secondary to inflammatory arthritis from possible C. difficile infection  Recommend stool studies to rule out C. difficile, CBC with differential, CMP  Her daughter will pick up the stool specimen collection kit from our office and bring her mom to the [facility] hospital lab today  I will follow-up on the results, will send Zofran to the pharmacy  Her daughter expressed understanding of the plan  Cephas Darby, MD [address]  [place]  [place], [place] [address]  Main: [phone]  Fax: [phone] Pager: [phone]

## 2018-06-23 NOTE — Telephone Encounter (Signed)
I see Dr Marius Ditch has touched base with patient.  I sent her a message via mychart.

## 2018-06-23 NOTE — Telephone Encounter (Signed)
I spoke with [name] (DPR signed);pt had diarrhea for 2 days; rebecca thinks diarrhea and vomiting is coming from a reaction to abx [name] does not know name of abx at this time). [name] has call into Dr Verlin Grills office who prescribed the abx. No fever; pt eating very little yesterday had 1/2 piece of bread and 2 crackers.last vomited 06/22/18 at 5PM. Pt is able to keep liquids down.upper and lower abd soreness. Last diarrhea this morning was watery with slight consistency. [name] will try Dr Verlin Grills office again and wondered if Dr Darnell Level had any suggestions.

## 2018-06-23 NOTE — Addendum Note (Signed)
Addended by: Cephas Darby on: 06/23/2018 12:47 PM   Modules accepted: Orders

## 2018-06-23 NOTE — Telephone Encounter (Signed)
[facility] Patient Name: TANIJAH MORAIS Gender: Female DOB: Aug 05, 1930 Age: 85 Y 54 M 30 D Return Phone Number: [phone] (Primary), [phone] (Secondary) Address: City/State/Zip: [place] [address] Client [facility] Night - Client Client Site [facility] Physician [name] - MD Contact Type Call Who Is Calling Patient / Member / Family / Caregiver Call Type Triage / Clinical Caller Name Antoine Primas Relationship To Patient Daughter Return Phone Number [phone] (Primary) Chief Complaint Vomiting Reason for Call Symptomatic / Request for [place] states her mother has a fever of 102.0, vomiting, and diarrhea. She has had urine output in the past 8 hours and no blood in vomit. Translation No Nurse Assessment Nurse: Jimmye Norman, RN, [place] Date/Time (Eastern Time): 06/22/2018 6:17:04 PM Confirm and document reason for call. If symptomatic, describe symptoms. ---Caller states her mother has a fever of 102.0, vomiting 4x, and diarrhea x8. She has had urine output in the past 8 hours and no blood in vomit. States symptoms started last night with body aches. Has the patient traveled to [place] OR had close contact with a person known to have the novel coronavirus illness in the last 14 days? ---No Does the patient have any new or worsening symptoms? ---Yes Will a triage be completed? ---Yes Related visit to physician within the last 2 weeks? ---No Does the PT have any chronic conditions? (i.e. diabetes, asthma, this includes High risk factors for pregnancy, etc.) ---Yes List chronic conditions. ---GI problems, hypertension, anxiety Is this a behavioral health or substance abuse call? ---No Guidelines Guideline Title Affirmed Question Affirmed Notes Nurse Date/Time  Eilene Ghazi Time) Vomiting [1] MODERATE vomiting (e.g., 3 - 5 times/day) AND [2] age > 16 Jimmye Norman, [place], Loree Fee 06/22/2018 6:20:23 PM Disp. Time Eilene Ghazi Time) Disposition Final User PLEASE NOTE: All timestamps contained within this report are represented as [place] Standard Time. CONFIDENTIALTY NOTICE: This fax transmission is intended only for the addressee. It contains information that is legally privileged, confidential or otherwise protected from use or disclosure. If you are not the intended recipient, you are strictly prohibited from reviewing, disclosing, copying using or disseminating any of this information or taking any action in reliance on or regarding this information. If you have received this fax in error, please notify us immediately by telephone so that we can arrange for its return to [place]. Phone: [phone], Toll-Free: [phone], Fax: [phone] Page: 2 of 2 Call Id: [id number] 06/22/2018 6:25:29 PM Go to ED Now (or PCP triage) Yes Jimmye Norman, RN, Whitney Caller Disagree/Comply Disagree Caller Understands Yes PreDisposition InappropriateToAsk Care Advice Given Per Guideline * IF NO PCP (PRIMARY CARE PROVIDER) SECOND-LEVEL TRIAGE: You need to be seen within the next hour. Go to the [facility] at _____________ [place] as soon as you can. Referrals GO TO FACILITY REFUSED

## 2018-06-23 NOTE — Telephone Encounter (Signed)
Pt  Daughter is calling for Dr. Marius Ditch she was put on antibiotic and is having a reaction she started having stomach abdominal discomfort she is nauseas and started vomiting and also had a fever of 102. Please call pt daughter for advise

## 2018-06-23 NOTE — Telephone Encounter (Signed)
E-scribed refill 

## 2018-06-24 ENCOUNTER — Other Ambulatory Visit: Payer: Self-pay | Admitting: Gastroenterology

## 2018-06-24 DIAGNOSIS — A0472 Enterocolitis due to Clostridium difficile, not specified as recurrent: Secondary | ICD-10-CM

## 2018-06-24 LAB — C DIFFICILE QUICK SCREEN W PCR REFLEX
C Diff antigen: POSITIVE — AB
C Diff interpretation: DETECTED
C Diff toxin: POSITIVE — AB

## 2018-06-24 MED ORDER — VANCOMYCIN HCL 125 MG PO CAPS: 125.0000 mg | ORAL_CAPSULE | Freq: Four times a day (QID) | ORAL | 0 refills | Status: AC

## 2018-06-27 ENCOUNTER — Encounter: Payer: Self-pay | Admitting: Gastroenterology

## 2018-06-28 ENCOUNTER — Ambulatory Visit: Payer: PPO | Admitting: Gastroenterology

## 2018-07-02 ENCOUNTER — Encounter: Payer: Self-pay | Admitting: Family Medicine

## 2018-07-03 MED ORDER — CLONAZEPAM 0.5 MG PO TABS: ORAL_TABLET | ORAL | 3 refills | Status: DC

## 2018-07-03 NOTE — Telephone Encounter (Signed)
Name of Medication: Clonazepam Name of Pharmacy: [facility] or Written Date and Quantity: 03/06/18, #30 Last Office Visit and Type: 05/25/18, f/u Next Office Visit and Type: 10/23/18, CPE Pt 2 Last Controlled Substance Agreement Date: 09/26/14 Last UDS: 09/26/14

## 2018-07-03 NOTE — Telephone Encounter (Signed)
Eprescribed.

## 2018-07-05 ENCOUNTER — Encounter: Payer: Self-pay | Admitting: Gastroenterology

## 2018-07-11 ENCOUNTER — Ambulatory Visit: Payer: Self-pay | Admitting: *Deleted

## 2018-07-11 ENCOUNTER — Other Ambulatory Visit: Payer: Self-pay

## 2018-07-11 ENCOUNTER — Ambulatory Visit: Payer: PPO | Admitting: Family Medicine

## 2018-07-11 ENCOUNTER — Ambulatory Visit (INDEPENDENT_AMBULATORY_CARE_PROVIDER_SITE_OTHER): Payer: PPO | Admitting: Family Medicine

## 2018-07-11 ENCOUNTER — Encounter: Payer: Self-pay | Admitting: Gastroenterology

## 2018-07-11 ENCOUNTER — Encounter: Payer: Self-pay | Admitting: Family Medicine

## 2018-07-11 ENCOUNTER — Ambulatory Visit (INDEPENDENT_AMBULATORY_CARE_PROVIDER_SITE_OTHER)
Admission: RE | Admit: 2018-07-11 | Discharge: 2018-07-11 | Disposition: A | Discharge status: Home / Self Care | Payer: PPO | Source: Ambulatory Visit | Attending: Family Medicine | Admitting: Family Medicine

## 2018-07-11 VITALS — BP 122/68 | HR 80 | Temp 98.7°F | Ht 62.0 in | Wt 158.4 lb

## 2018-07-11 DIAGNOSIS — R059 Cough, unspecified: Secondary | ICD-10-CM | POA: Insufficient documentation

## 2018-07-11 DIAGNOSIS — R05 Cough: Secondary | ICD-10-CM

## 2018-07-11 DIAGNOSIS — A0472 Enterocolitis due to Clostridium difficile, not specified as recurrent: Secondary | ICD-10-CM | POA: Diagnosis not present

## 2018-07-11 DIAGNOSIS — J209 Acute bronchitis, unspecified: Secondary | ICD-10-CM | POA: Diagnosis not present

## 2018-07-11 DIAGNOSIS — A0471 Enterocolitis due to Clostridium difficile, recurrent: Secondary | ICD-10-CM | POA: Insufficient documentation

## 2018-07-11 HISTORY — DX: Enterocolitis due to Clostridium difficile, not specified as recurrent: A04.72

## 2018-07-11 MED ORDER — BENZONATATE 100 MG PO CAPS: 100.0000 mg | ORAL_CAPSULE | Freq: Three times a day (TID) | ORAL | 0 refills | Status: DC | PRN

## 2018-07-11 MED ORDER — AZITHROMYCIN 250 MG PO TABS: ORAL_TABLET | ORAL | 0 refills | Status: DC

## 2018-07-11 NOTE — Progress Notes (Signed)
BP 122/68 (BP Location: Left Arm, Patient Position: Sitting, Cuff Size: Normal)    Pulse 80    Temp 98.7 F (37.1 C) (Oral)    Ht 5\' 2"  (1.575 m)    Wt 158 lb 7 oz (71.9 kg)    SpO2 98%    BMI 28.98 kg/m    CC: cough Subjective:    Patient ID: Brooke Beasley, female    DOB: 04-27-1930, 85 y.o.   MRN: [id number]  HPI: Brooke Beasley is a 85 y.o. female presenting on 07/11/2018 for Cough (C/o productive cough, chest congestion, fever- max 100 and fatigue. Sxs started about 1 wk ago. Tried vit C and [employer] DM. Pt is concerned about getting pneumonia again. )   1 wk h/o thick post nasal drainage that has led to productive cough or yellow mucous, feverish Tmax 100 last night, feeling weak. Night sweats. Some dyspnea and wheeze. No significant chest discomfort.   So far has tried vitamin C and OTC expectorant/suppressant.   Some abd pain (chronic), no ear or tooth pain, HA, ST, body aches.   Granddaughter has had RN, cough.  Several GI antibiotics recently.  No history of asthma.  Non smoker  Recent C diff infection 05/2018 treated with vancomycin. She did previously complete cipro course also in 05/2018.   Sleeping with 2 pillows at night time - no change. No significant leg swelling.      Relevant past medical, surgical, family and social history reviewed and updated as indicated. Interim medical history since our last visit reviewed. Allergies and medications reviewed and updated. Outpatient Medications Prior to Visit  Medication Sig Dispense Refill   acetaminophen (TYLENOL) 500 MG tablet Take 500-1,000 mg by mouth at bedtime. Depends on pain     albuterol (PROVENTIL HFA;VENTOLIN HFA) 108 (90 Base) MCG/ACT inhaler Inhale 2 puffs into the lungs every 6 (six) hours as needed for wheezing or shortness of breath. 1 Inhaler 0   amLODipine (NORVASC) 10 MG tablet Take 1 tablet (10 mg total) by mouth daily. 90 tablet 3   bimatoprost (LUMIGAN)  0.01 % SOLN Place 1 drop into both eyes at bedtime.     Bioflavonoid Products (VITAMIN C) CHEW Chew 1 tablet by mouth daily.     cholecalciferol (VITAMIN D) 1000 units tablet Take 1,000 Units by mouth daily.     clonazePAM (KLONOPIN) 0.5 MG tablet TAKE 1 TABLET BY MOUTH AT BEDTIME AS NEEDED FOR ANXIETY 30 tablet 3   colchicine 0.6 MG tablet Take 1 tablet by mouth daily as needed 90 tablet 0   Diclofenac Sodium CR (VOLTAREN-XR) 100 MG 24 hr tablet Take 1 tablet (100 mg total) by mouth daily. 10 tablet 0   docusate sodium (COLACE) 100 MG capsule Take 1 capsule (100 mg total) by mouth 2 (two) times daily.  0   famotidine (PEPCID) 20 MG tablet Take 1 tablet (20 mg total) by mouth daily as needed for heartburn or indigestion.     fish oil-omega-3 fatty acids 1000 MG capsule Take 1 g by mouth daily.      furosemide (LASIX) 20 MG tablet TAKE 1 TABLET BY MOUTH ONCE DAILY AS NEEDED FOR  EDEMA 30 tablet 3   HYDROcodone-acetaminophen (NORCO/VICODIN) 5-325 MG tablet Take 1-2 tablets by mouth every 6 (six) hours as needed for moderate pain or severe pain. 20 tablet 0   hydrocortisone (ANUSOL-HC) 25 MG suppository Place 1 suppository (25 mg total) rectally 2 (two) times daily. 12 suppository  2   levothyroxine (SYNTHROID, LEVOTHROID) 100 MCG tablet Take 1 tablet (100 mcg total) by mouth daily. 30 tablet 11   methocarbamol (ROBAXIN) 500 MG tablet Take 1 tablet (500 mg total) by mouth every 6 (six) hours as needed for muscle spasms. 20 tablet 0   Misc Natural Products (OSTEO BI-FLEX TRIPLE STRENGTH PO) Take 1 tablet by mouth daily. With Vit D     omeprazole (PRILOSEC) 20 MG capsule Take 1 capsule (20 mg total) by mouth daily. 90 capsule 3   ondansetron (ZOFRAN) 4 MG tablet Take 1 tablet (4 mg total) by mouth every 8 (eight) hours as needed for up to 10 doses for nausea or vomiting. 10 tablet 0   potassium chloride (K-DUR) 10 MEQ tablet Take 1 tablet (10 mEq total) by mouth daily as needed (with  lasix). 30 tablet 3   Pramox-PE-Glycerin-Petrolatum (PREPARATION H) 1-0.25-14.4-15 % CREA Use as directed 26 g 0   pravastatin (PRAVACHOL) 20 MG tablet Take 1 tablet (20 mg total) by mouth daily. 90 tablet 3   Psyllium (METAMUCIL) 30.9 % POWD daily     sertraline (ZOLOFT) 100 MG tablet Take 1 tablet (100 mg total) by mouth daily. 90 tablet 1   simethicone (GAS-X) 80 MG chewable tablet Chew 1 tablet (80 mg total) by mouth 2 (two) times daily.     TURMERIC PO Take 1 tablet by mouth daily.     VITAMIN E PO Take 1 tablet by mouth daily.     VOLTAREN 1 % GEL APPLY ONE APPLICATION TOPICALLY 3 TIMES DAILY (Patient taking differently: APPLY ONE APPLICATION TOPICALLY daily as needed for shoulder pain) 100 g 3   predniSONE (DELTASONE) 20 MG tablet 2 tabs po daily x 3 days (Patient not taking: Reported on 06/05/2018) 6 tablet 0   benzonatate (TESSALON) 100 MG capsule Take 1 capsule (100 mg total) by mouth 3 (three) times daily as needed for cough. 30 capsule 0   ciprofloxacin (CIPRO) 500 MG tablet Take 1 tablet (500 mg total) by mouth 2 (two) times daily. 20 tablet 0   No facility-administered medications prior to visit.      Per HPI unless specifically indicated in ROS section below Review of Systems Objective:    BP 122/68 (BP Location: Left Arm, Patient Position: Sitting, Cuff Size: Normal)    Pulse 80    Temp 98.7 F (37.1 C) (Oral)    Ht 5\' 2"  (1.575 m)    Wt 158 lb 7 oz (71.9 kg)    SpO2 98%    BMI 28.98 kg/m   Wt Readings from Last 3 Encounters:  07/11/18 158 lb 7 oz (71.9 kg)  06/05/18 160 lb 3.2 oz (72.7 kg)  05/28/18 160 lb 15 oz (73 kg)    Physical Exam Vitals signs and nursing note reviewed.  Constitutional:      General: She is not in acute distress.    Appearance: Normal appearance. She is well-developed. She is not ill-appearing.  HENT:     Head: Normocephalic and atraumatic.     Right Ear: Hearing, tympanic membrane, ear canal and external ear normal.     Left Ear:  Hearing, tympanic membrane, ear canal and external ear normal.     Nose: Nose normal. No mucosal edema, congestion or rhinorrhea.     Right Sinus: No maxillary sinus tenderness or frontal sinus tenderness.     Left Sinus: No maxillary sinus tenderness or frontal sinus tenderness.     Mouth/Throat:  Mouth: Mucous membranes are moist.     Pharynx: Uvula midline. No oropharyngeal exudate or posterior oropharyngeal erythema.     Tonsils: No tonsillar abscesses.  Eyes:     General: No scleral icterus.    Conjunctiva/sclera: Conjunctivae normal.     Pupils: Pupils are equal, round, and reactive to light.  Neck:     Musculoskeletal: Normal range of motion and neck supple.  Cardiovascular:     Rate and Rhythm: Normal rate and regular rhythm.     Pulses: Normal pulses.     Heart sounds: Normal heart sounds. No murmur.  Pulmonary:     Effort: Pulmonary effort is normal. No respiratory distress.     Breath sounds: Wheezing (faint) and rales (faint RLL) present. No rhonchi.     Comments: Coarse throughout, deep hoarse cough present Lymphadenopathy:     Cervical: No cervical adenopathy.  Skin:    General: Skin is warm and dry.     Findings: No rash.  Neurological:     Mental Status: She is alert.       Results for orders placed or performed during the hospital encounter of 06/23/18  C difficile quick scan w PCR reflex  Result Value Ref Range   C Diff antigen POSITIVE (A) NEGATIVE   C Diff toxin POSITIVE (A) NEGATIVE   C Diff interpretation Toxin producing C. difficile detected.   Comprehensive metabolic panel  Result Value Ref Range   Sodium 131 (L) 135 - 145 mmol/L   Potassium 3.4 (L) 3.5 - 5.1 mmol/L   Chloride 98 98 - 111 mmol/L   CO2 24 22 - 32 mmol/L   Glucose, Bld 117 (H) 70 - 99 mg/dL   BUN 13 8 - 23 mg/dL   Creatinine, Ser 1.00 0.44 - 1.00 mg/dL   Calcium 8.4 (L) 8.9 - 10.3 mg/dL   Total Protein 6.6 6.5 - 8.1 g/dL   Albumin 3.6 3.5 - 5.0 g/dL   AST 20 15 - 41 U/L    ALT 13 0 - 44 U/L   Alkaline Phosphatase 66 38 - 126 U/L   Total Bilirubin 0.8 0.3 - 1.2 mg/dL   GFR calc non Af Amer 51 (L) >60 mL/min   GFR calc Af Amer 59 (L) >60 mL/min   Anion gap 9 5 - 15  CBC with Differential/Platelet  Result Value Ref Range   WBC 9.6 4.0 - 10.5 K/uL   RBC 4.39 3.87 - 5.11 MIL/uL   Hemoglobin 13.1 12.0 - 15.0 g/dL   HCT 39.6 36.0 - 46.0 %   MCV 90.2 80.0 - 100.0 fL   MCH 29.8 26.0 - 34.0 pg   MCHC 33.1 30.0 - 36.0 g/dL   RDW 13.9 11.5 - 15.5 %   Platelets 212 150 - 400 K/uL   nRBC 0.0 0.0 - 0.2 %   Neutrophils Relative % 63 %   Neutro Abs 6.0 1.7 - 7.7 K/uL   Lymphocytes Relative 19 %   Lymphs Abs 1.9 0.7 - 4.0 K/uL   Monocytes Relative 16 %   Monocytes Absolute 1.5 (H) 0.1 - 1.0 K/uL   Eosinophils Relative 2 %   Eosinophils Absolute 0.2 0.0 - 0.5 K/uL   Basophils Relative 0 %   Basophils Absolute 0.0 0.0 - 0.1 K/uL   Immature Granulocytes 0 %   Abs Immature Granulocytes 0.03 0.00 - 0.07 K/uL   DG Chest 2 View CLINICAL DATA:  Cough.  EXAM: CHEST - 2 VIEW  COMPARISON:  01/25/2018.  FINDINGS: Mediastinum hilar structures normal. Cardiomegaly with normal pulmonary vascularity. No focal infiltrate. Biapical pleural thickening consistent scarring. No pleural effusion or pneumothorax. Left shoulder replacement.  IMPRESSION: 1.  Cardiomegaly with normal pulmonary vascularity.  2. Mild bibasilar subsegmental atelectasis/scarring. No focal infiltrate. Chest is stable from prior exam.  Electronically Signed   By: Marcello Moores  Register   On: 07/11/2018 15:50   Assessment & Plan:   Problem List Items Addressed This Visit    Cough   Relevant Orders   DG Chest 2 View (Completed)   C. difficile diarrhea    Just completed vanc course, with ongoing diarrhea. Has contacted GI for recommendations - may need rpt treatment.       Relevant Medications   azithromycin (ZITHROMAX) 250 MG tablet   Acute bronchitis - Primary    Anticipate acute bronchitis.  CXR without infiltrate.  Reviewed concerns for recent abx use, rec against further abx at this time. Supportive care reviewed as per instructions. Tessalon perls for cough. WASP for zpack with indications when to fill. Pt agrees with plan.           Meds ordered this encounter  Medications   azithromycin (ZITHROMAX) 250 MG tablet    Sig: Take two tablets on day one followed by one tablet on days 2-5    Dispense:  6 each    Refill:  0   benzonatate (TESSALON) 100 MG capsule    Sig: Take 1 capsule (100 mg total) by mouth 3 (three) times daily as needed for cough.    Dispense:  30 capsule    Refill:  0   Orders Placed This Encounter  Procedures   DG Chest 2 View    Standing Status:   Future    Number of Occurrences:   1    Standing Expiration Date:   09/10/2019    Order Specific Question:   Reason for Exam (SYMPTOM  OR DIAGNOSIS REQUIRED)    Answer:   cough, mild RLL rales, h/o PNA    Order Specific Question:   Preferred imaging location?    Answer:   [facility]    Order Specific Question:   Radiology Contrast Protocol - do NOT remove file path    Answer:   [email or web address]    Patient Instructions  Xray looked reassuringly ok - no pneumonia. I think you have bronchitis - inflammation of the central part of the lungs.  This is likely viral process and should improve over time.  Take tessalon perls for cough as needed, continue the [place] crud crusher, push fluids and rest.  If fever >101, worsening productive cough or shortness of breath, fill antibiotic provided today (zpack).  I hope you start feeling better!!    Follow up plan: No follow-ups on file.  [name], MD

## 2018-07-11 NOTE — Telephone Encounter (Signed)
Neoma Laming called requesting a patient to be triaged. Spoke with pt's daughter (on [place]), requestintg an appointment. She is stating her mom is c/o chest tightness/heaviness on the left side of her chest. she denies shortness of breath. Temp 101 yesterday and today it is  98.4. She has a cough that is persistent and states she can feel "gurgling" in her left  Lung. The daughter is requesting a chest xray to r/o pneumonia. She was diagnosed with pneumonia a few months ago. Denies nausea, dizziness and shortness of breath. Advised that if she starts experiencing increase in symptoms to call 911. Daughter voiced understanding. Appointment scheduled per protocol. Routing to flow at [facility].  Reason for Disposition . Fever > 100.5 F (38.1 C)  Answer Assessment - Initial Assessment Questions 1. LOCATION: "Where does it hurt?"       On the left side 2. RADIATION: "Does the pain go anywhere else?" (e.g., into neck, jaw, arms, back)     Into her neck and down to the rib 3. ONSET: "When did the chest pain begin?" (Minutes, hours or days)      About 4 days ago 4. PATTERN "Does the pain come and go, or has it been constant since it started?"  "Does it get worse with exertion?"      constant 5. DURATION: "How long does it last" (e.g., seconds, minutes, hours)    continuosly 6. SEVERITY: "How bad is the pain?"  (e.g., Scale 1-10; mild, moderate, or severe)    - MILD (1-3): doesn't interfere with normal activities     - MODERATE (4-7): interferes with normal activities or awakens from sleep    - SEVERE (8-10): excruciating pain, unable to do any normal activities       moderate 7. CARDIAC RISK FACTORS: "Do you have any history of heart problems or risk factors for heart disease?" (e.g., prior heart attack, angina; high blood pressure, diabetes, being overweight, high cholesterol, smoking, or strong family history of heart disease)     High blood pressure, overweight, high cholesterol, father hx of  hardening of arteries 8. PULMONARY RISK FACTORS: "Do you have any history of lung disease?"  (e.g., blood clots in lung, asthma, emphysema, birth control pills)     Asthma in the past 9. CAUSE: "What do you think is causing the chest pain?"    Not sure, possibly pneumonia 10. OTHER SYMPTOMS: "Do you have any other symptoms?" (e.g., dizziness, nausea, vomiting, sweating, fever, difficulty breathing, cough)       Fever, sweating, cough 11. PREGNANCY: "Is there any chance you are pregnant?" "When was your last menstrual period?"       n/a  Protocols used: CHEST PAIN-A-AH

## 2018-07-11 NOTE — Patient Instructions (Addendum)
Xray looked reassuringly ok - no pneumonia. I think you have bronchitis - inflammation of the central part of the lungs.  This is likely viral process and should improve over time.  Take tessalon perls for cough as needed, continue the [place] crud crusher, push fluids and rest.  If fever >101, worsening productive cough or shortness of breath, fill antibiotic provided today (zpack).  I hope you start feeling better!!

## 2018-07-11 NOTE — Assessment & Plan Note (Signed)
Just completed vanc course, with ongoing diarrhea. Has contacted GI for recommendations - may need rpt treatment.

## 2018-07-11 NOTE — Telephone Encounter (Signed)
Pt already has appt with Dr Darnell Level 07/11/18 at [facility]

## 2018-07-11 NOTE — Assessment & Plan Note (Signed)
Anticipate acute bronchitis. CXR without infiltrate.  Reviewed concerns for recent abx use, rec against further abx at this time. Supportive care reviewed as per instructions. Tessalon perls for cough. WASP for zpack with indications when to fill. Pt agrees with plan.

## 2018-07-13 ENCOUNTER — Other Ambulatory Visit: Payer: Self-pay

## 2018-07-13 ENCOUNTER — Other Ambulatory Visit
Admission: RE | Admit: 2018-07-13 | Discharge: 2018-07-13 | Disposition: A | Discharge status: Home / Self Care | Payer: PPO | Source: Ambulatory Visit | Attending: Gastroenterology | Admitting: Gastroenterology

## 2018-07-13 ENCOUNTER — Encounter: Payer: Self-pay | Admitting: Family Medicine

## 2018-07-13 DIAGNOSIS — R197 Diarrhea, unspecified: Secondary | ICD-10-CM

## 2018-07-13 LAB — CBC
HCT: 38.5 % (ref 36.0–46.0)
HEMOGLOBIN: 12.7 g/dL (ref 12.0–15.0)
MCH: 29.7 pg (ref 26.0–34.0)
MCHC: 33 g/dL (ref 30.0–36.0)
MCV: 90 fL (ref 80.0–100.0)
Platelets: 202 10*3/uL (ref 150–400)
RBC: 4.28 MIL/uL (ref 3.87–5.11)
RDW: 13.8 % (ref 11.5–15.5)
WBC: 4.4 10*3/uL (ref 4.0–10.5)
nRBC: 0 % (ref 0.0–0.2)

## 2018-07-13 LAB — C DIFFICILE QUICK SCREEN W PCR REFLEX
C Diff antigen: POSITIVE — AB
C Diff interpretation: DETECTED
C Diff toxin: POSITIVE — AB

## 2018-07-13 LAB — GASTROINTESTINAL PANEL BY PCR, STOOL (REPLACES STOOL CULTURE)

## 2018-07-13 LAB — ELECTROLYTE PANEL
Anion gap: 10 (ref 5–15)
CO2: 25 mmol/L (ref 22–32)
Chloride: 98 mmol/L (ref 98–111)
POTASSIUM: 3.9 mmol/L (ref 3.5–5.1)
Sodium: 133 mmol/L — ABNORMAL LOW (ref 135–145)

## 2018-07-13 MED ORDER — FIDAXOMICIN 200 MG PO TABS: 200.0000 mg | ORAL_TABLET | Freq: Two times a day (BID) | ORAL | 0 refills | Status: AC

## 2018-07-13 NOTE — Progress Notes (Signed)
Labs have been ordered per Dr. Marius Ditch, pt has been notified

## 2018-07-18 ENCOUNTER — Encounter: Payer: Self-pay | Admitting: Gastroenterology

## 2018-07-19 ENCOUNTER — Telehealth: Payer: Self-pay | Admitting: Gastroenterology

## 2018-07-19 NOTE — Telephone Encounter (Signed)
Derek from [employer]  regarding rx request they have further questions regarding this request cb [phone] option  3 ref # [id number]

## 2018-07-20 ENCOUNTER — Telehealth: Payer: PPO | Admitting: Gastroenterology

## 2018-07-20 ENCOUNTER — Ambulatory Visit: Payer: PPO | Admitting: Gastroenterology

## 2018-07-20 NOTE — Telephone Encounter (Signed)
Medication has been sent to [facility] and will be filled, pt has been notified

## 2018-07-21 ENCOUNTER — Ambulatory Visit: Payer: PPO | Admitting: Gastroenterology

## 2018-07-21 ENCOUNTER — Other Ambulatory Visit: Payer: Self-pay

## 2018-07-21 DIAGNOSIS — A0471 Enterocolitis due to Clostridium difficile, recurrent: Secondary | ICD-10-CM

## 2018-07-21 NOTE — Progress Notes (Signed)
Cephas Darby, MD [address]  [place]  [place], [place] [address]  Main: [phone]  Fax: [phone]    Gastroenterology follow up Virtual/Tele Visit  Referring Provider:     [name], MD Primary Care Physician:  [name], MD Primary Gastroenterologist: Dr Gustavo Lah Reason for Consultation:     Recurrent C Diff infection        HPI:   Brooke Beasley is a 85 y.o. female referred by Dr. [name], MD  for consultation & management of recurrent C Diff infection  Virtual Visit via Telephone Note  I connected with Brooke Beasley on 07/21/18 at  8:45 AM EDT by telephone and verified that I am speaking with the correct person using two identifiers.   I discussed the limitations, risks, security and privacy concerns of performing an evaluation and management service by telephone and the availability of in person appointments. I also discussed with the patient that there may be a patient responsible charge related to this service. The patient expressed understanding and agreed to proceed.  Location of the Patient: [place] office. Pt's daughter brought her to office today not knowing that it was a televisit. She said she received message on my chart   Location of the provider: Home office   History of Present Illness: Patient was initially seen by me in 05/2018 after she decided to switch her care to [facility] from [facility]. Pt had initial episode of C Diff diarrhea after she was started on cipro and flagyl for possible bacterial overgrowth given her symptoms of significant abdominal bloating. I originally prescribed rifaximin but was not covered by her insurance, therefore switched to cipro and flagyl. She developed fever, n/v/diarrhea and abdominal cramps. Stool studies on 2/06/03/18 returned positive for C Diff antigen and toxin. I started her on 10days course of oral vancomycin which she finished but her diarrhea did not improve. Repeat C  Diff returned positive on 07/13/18. I started on dificid which she started 1week ago. Her daughter told me today that she paid full amount $1600 as it was not covered by her insurance. She did not contact my office regarding this. She said they applied for a grant with her insurance. However, her diarrhea currently resolved. She finished a course of azithromycin for bronchitis. Recovering from it. She is currently having 3-4 small mushy BMs. She is also taking colace and benefiber daily. She denies f/c/n/v/abdomina pain, rectal bleeding. Has tingling from hemorrhoids. CBC, CMP normal.     NSAIDs: none  Antiplts/Anticoagulants/Anti thrombotics: none  GI Procedures: GI Procedures: Flexible sigmoidoscopy in 08/2003 tics to 30cm, random biopsies taken Incomplete colonoscopy 08/11/2016 CT colonography 08/11/2016 at [facility], somewhat limited but no definite polyps/mass/stricture Colonoscopy 2012 was incomplete and the barium enema study 06/16/2010 showed a redundant/tortuous pathway and colonic diverticulosis without other lesions. Referred to Dr Dorise Hiss for pelvic floor therapy and had anal manometry at [facility], which was noted to be a normal study, and pelvic floor therapy was recommended - patient had been attending this weekly - providers notes indicate a restrictive scar at 5-7oclock in the anal ring - evidently this has had some improvement after therapy.    EGD 02/2003 Minimal esophagitis, small hiatal hernia   Past Medical History:  Diagnosis Date  . Anxiety   . CAP (community acquired pneumonia) 12/22/2017  . Carotid stenosis 10/18/2014   R 1-39%, L 40-59%, rpt 1 yr (09/2014)   . CKD (chronic kidney disease) stage 3, GFR  30-59 ml/min ([place]) 08/02/2014  . Degenerative disc disease    LS  . Depression    nervous breakdonw in 1964-out of work for a year  . Diverticulosis    severe by colonoscopy  . Family history of adverse reaction to anesthesia    n/v  . GERD (gastroesophageal reflux disease)   .  Glaucoma   . Heart murmur 5/13   mitral regurge - on echo   . History of hiatal hernia   . History of shingles   . HTN (hypertension)   . Hyperlipidemia   . Hypertension   . Hypothyroidism   . IBS 11/02/2006  . Intestinal bacterial overgrowth    In small colon  . Maxillary fracture ([place]) 04/08/2012  . Osteoarthritis 2016   [facility])  . Osteoporosis    dexa 2011  . Pseudogout 2016   shoulders ([place])    Past Surgical History:  Procedure Laterality Date  . barium enema  2012   severe diverticulosis, redundant colon  . Bowel obstruction  1999   no surgery in hosp x 3 days  . COLONOSCOPY  1. 1999  2. 11/04   1. Not finished  2. Slight hemorrhage rectosigmoid area, severe sig diverticulosis  . Dexa  1. 5361-4431   2. 9/04  3. 3/08    1. OP  2. OP, borderline, spine -2.44T  3. decreased BMD-OP  . DEXA  2011   OP in spine  . DG KNEE 1-2 VIEWS BILAT     LS x-ray with degenerative disc and facet change  . ESOPHAGOGASTRODUODENOSCOPY     Negative  . Hemorrhoid procedure  4/08  . JOINT REPLACEMENT    . TONSILLECTOMY    . TOTAL SHOULDER ARTHROPLASTY Left 11/27/2015   Corky Mull, MD  . TOTAL SHOULDER REVISION Left 03/16/2016   Procedure: TOTAL SHOULDER REVISION;  Surgeon: Corky Mull, MD;  Location: [facility] ORS;  Service: Orthopedics;  Laterality: Left;  . US ECHOCARDIOGRAPHY  08/4006   Normal systolic fxn with EF 67-61%.  Focal basal septal hypertrophy.  Mild diastolic dysfunction.  Mild MR.    Current Outpatient Medications:  .  acetaminophen (TYLENOL) 500 MG tablet, Take 500-1,000 mg by mouth at bedtime. Depends on pain, Disp: , Rfl:  .  amLODipine (NORVASC) 10 MG tablet, Take 1 tablet (10 mg total) by mouth daily., Disp: 90 tablet, Rfl: 3 .  benzonatate (TESSALON) 100 MG capsule, Take 1 capsule (100 mg total) by mouth 3 (three) times daily as needed for cough., Disp: 30 capsule, Rfl: 0 .  bimatoprost (LUMIGAN) 0.01 % SOLN, Place 1 drop into both eyes at bedtime., Disp: , Rfl:   .  clonazePAM (KLONOPIN) 0.5 MG tablet, TAKE 1 TABLET BY MOUTH AT BEDTIME AS NEEDED FOR ANXIETY, Disp: 30 tablet, Rfl: 3 .  fidaxomicin (DIFICID) 200 MG TABS tablet, Take 1 tablet (200 mg total) by mouth 2 (two) times daily for 14 days., Disp: 28 tablet, Rfl: 0 .  furosemide (LASIX) 20 MG tablet, TAKE 1 TABLET BY MOUTH ONCE DAILY AS NEEDED FOR  EDEMA, Disp: 30 tablet, Rfl: 3 .  levothyroxine (SYNTHROID, LEVOTHROID) 100 MCG tablet, Take 1 tablet (100 mcg total) by mouth daily., Disp: 30 tablet, Rfl: 11 .  Misc Natural Products (OSTEO BI-FLEX TRIPLE STRENGTH PO), Take 1 tablet by mouth daily. With Vit D, Disp: , Rfl:  .  omeprazole (PRILOSEC) 20 MG capsule, Take 1 capsule (20 mg total) by mouth daily., Disp: 90 capsule, Rfl: 3 .  ondansetron (  ZOFRAN) 4 MG tablet, Take 1 tablet (4 mg total) by mouth every 8 (eight) hours as needed for up to 10 doses for nausea or vomiting., Disp: 10 tablet, Rfl: 0 .  potassium chloride (K-DUR) 10 MEQ tablet, Take 1 tablet (10 mEq total) by mouth daily as needed (with lasix)., Disp: 30 tablet, Rfl: 3 .  sertraline (ZOLOFT) 100 MG tablet, Take 1 tablet (100 mg total) by mouth daily., Disp: 90 tablet, Rfl: 1 .  TURMERIC PO, Take 1 tablet by mouth daily., Disp: , Rfl:  .  albuterol (PROVENTIL HFA;VENTOLIN HFA) 108 (90 Base) MCG/ACT inhaler, Inhale 2 puffs into the lungs every 6 (six) hours as needed for wheezing or shortness of breath. (Patient not taking: Reported on 07/21/2018), Disp: 1 Inhaler, Rfl: 0 .  Bioflavonoid Products (VITAMIN C) CHEW, Chew 1 tablet by mouth daily., Disp: , Rfl:  .  cholecalciferol (VITAMIN D) 1000 units tablet, Take 1,000 Units by mouth daily., Disp: , Rfl:  .  fish oil-omega-3 fatty acids 1000 MG capsule, Take 1 g by mouth daily. , Disp: , Rfl:  .  HYDROcodone-acetaminophen (NORCO/VICODIN) 5-325 MG tablet, Take 1-2 tablets by mouth every 6 (six) hours as needed for moderate pain or severe pain. (Patient not taking: Reported on 07/21/2018), Disp:  20 tablet, Rfl: 0 .  methocarbamol (ROBAXIN) 500 MG tablet, Take 1 tablet (500 mg total) by mouth every 6 (six) hours as needed for muscle spasms. (Patient not taking: Reported on 07/21/2018), Disp: 20 tablet, Rfl: 0 .  VITAMIN E PO, Take 1 tablet by mouth daily., Disp: , Rfl:  .  VOLTAREN 1 % GEL, APPLY ONE APPLICATION TOPICALLY 3 TIMES DAILY (Patient taking differently: APPLY ONE APPLICATION TOPICALLY daily as needed for shoulder pain), Disp: 100 g, Rfl: 3   Family History  Problem Relation Age of Onset  . Diabetes Brother        post-op  . Coronary artery disease Brother   . Diabetes Paternal Grandfather   . Breast cancer Cousin      Social History   Tobacco Use  . Smoking status: Never Smoker  . Smokeless tobacco: Never Used  Substance Use Topics  . Alcohol use: No  . Drug use: No    Allergies as of 07/21/2018 - Review Complete 07/11/2018  Allergen Reaction Noted  . Prednisone Other (See Comments) 11/12/2015  . Ace inhibitors Other (See Comments) 04/02/2008  . Alendronate sodium  09/14/2006  . Losartan Other (See Comments) 09/26/2014  . Other Other (See Comments) 11/12/2015  . Paroxetine hcl Other (See Comments) 06/15/2011  . Risedronate sodium  03/13/2007  . Silenor [doxepin hcl] Other (See Comments) 04/02/2013  . Simvastatin  10/03/2009  . Sulfonamide derivatives  06/08/2006  . Tramadol Other (See Comments) 11/12/2015  . Influenza vaccines Rash and Other (See Comments) 05/24/2014     Imaging Studies: Reviewed  Assessment and Plan:   Brooke Beasley is a 85 y.o. female with h/o severe diverticulosis, depression, anxiety, insomnia, severe constipation and abdominal bloating followed by antibiotic associated C Diff diarrhea, first episode in 05/2018 s/p 10days of oral vancomycin, 1 st recurrence in 06/2018, started on 2 weeks course of dificid. Her diarrhea currently resolved. Suggested her to stop stool softener, but to continue fiber. Recommend continue dificid  with prolonged taper. Applied for patient assistance program   Follow Up Instructions:   I discussed the assessment and treatment plan with the patient. The patient was provided an opportunity to ask questions and all were answered.  The patient agreed with the plan and demonstrated an understanding of the instructions.   The patient was advised to call back or seek an in-person evaluation if the symptoms worsen or if the condition fails to improve as anticipated.  I provided 20 minutes of non-face-to-face time during this encounter.   Follow up in 4weeks   Cephas Darby, MD

## 2018-07-26 ENCOUNTER — Encounter: Payer: Self-pay | Admitting: Gastroenterology

## 2018-07-28 ENCOUNTER — Encounter: Payer: Self-pay | Admitting: Gastroenterology

## 2018-08-15 ENCOUNTER — Ambulatory Visit (INDEPENDENT_AMBULATORY_CARE_PROVIDER_SITE_OTHER): Payer: PPO | Admitting: Gastroenterology

## 2018-08-15 DIAGNOSIS — Z87898 Personal history of other specified conditions: Secondary | ICD-10-CM

## 2018-08-15 DIAGNOSIS — A0471 Enterocolitis due to Clostridium difficile, recurrent: Secondary | ICD-10-CM

## 2018-08-15 DIAGNOSIS — K5900 Constipation, unspecified: Secondary | ICD-10-CM | POA: Diagnosis not present

## 2018-08-15 NOTE — Progress Notes (Signed)
Brooke Sear, MD [address]  [place]  [place], [place] [address]  Main: [phone]  Fax: [phone]    Gastroenterology follow-up tele Visit  Referring Provider:     [name], MD Primary Care Physician:  [name], MD Primary Gastroenterologist:  Dr. Cephas Darby Reason for Consultation:   Recurrent C. difficile infection, chronic constipation        HPI:   Brooke Beasley is a 85 y.o. female referred by Dr. [name], MD  for consultation & management of recurrent C. difficile infection  Virtual Visit via Telephone Note  I connected with Brooke Beasley on 08/16/18 at 11:30 AM EDT by telephone and verified that I am speaking with the correct person using two identifiers.   I discussed the limitations, risks, security and privacy concerns of performing an evaluation and management service by telephone and the availability of in person appointments. I also discussed with the patient that there may be a patient responsible charge related to this service. The patient expressed understanding and agreed to proceed.  Location of the Patient: Home  Location of the provider: Home office  Persons participating in the visit: Patient, patient's daughter, me  History of Present Illness:   Ms. Shaw developed C. difficile infection after she was empirically treated with Cipro and Flagyl for possible bacterial overgrowth in her chronic lower GI symptoms of constipation, bloating and cramps.  She was initially treated with oral vancomycin for 10 days.  She did not notice much improvement in her diarrhea.  Repeat C. difficile came back positive.  Therefore, she was treated with fidaxomicin twice daily for 14 days followed by long taper, currently she is on Dificid 1 pill on alternate days. Her diarrhea resolved.  Currently, she feels constipated for which she is taking MiraLAX daily along with Benefiber once a day.  She reports having  pea-sized bowel movements for 2 to 3 days, and she feels severely constipated, bloated and she increases Benefiber to 3 times that results in more bowel movements incomplete emptying.  She feels she is back to her baseline bowel pattern, struggling with getting her bowels regular   Past Medical History:  Diagnosis Date   Anxiety    CAP (community acquired pneumonia) 12/22/2017   Carotid stenosis 10/18/2014   R 1-39%, L 40-59%, rpt 1 yr (09/2014)    CKD (chronic kidney disease) stage 3, GFR 30-59 ml/min ([place]) 08/02/2014   Degenerative disc disease    LS   Depression    nervous breakdonw in 1964-out of work for a year   Diverticulosis    severe by colonoscopy   Family history of adverse reaction to anesthesia    n/v   GERD (gastroesophageal reflux disease)    Glaucoma    Heart murmur 5/13   mitral regurge - on echo    History of hiatal hernia    History of shingles    HTN (hypertension)    Hyperlipidemia    Hypertension    Hypothyroidism    IBS 11/02/2006   Intestinal bacterial overgrowth    In small colon   Maxillary fracture ([place]) 04/08/2012   Osteoarthritis 2016   ([facility])   Osteoporosis    dexa 2011   Pseudogout 2016   shoulders ([place])    Past Surgical History:  Procedure Laterality Date   barium enema  2012   severe diverticulosis, redundant colon   Bowel obstruction  1999   no surgery in hosp x 3 days  COLONOSCOPY  1. 1999  2. 11/04   1. Not finished  2. Slight hemorrhage rectosigmoid area, severe sig diverticulosis   Dexa  1. 1997-1998   2. 9/04  3. 3/08    1. OP  2. OP, borderline, spine -2.44T  3. decreased BMD-OP   DEXA  2011   OP in spine   DG KNEE 1-2 VIEWS BILAT     LS x-ray with degenerative disc and facet change   ESOPHAGOGASTRODUODENOSCOPY     Negative   Hemorrhoid procedure  4/08   JOINT REPLACEMENT     TONSILLECTOMY     TOTAL SHOULDER ARTHROPLASTY Left 11/27/2015   Corky Mull, MD   TOTAL SHOULDER REVISION  Left 03/16/2016   Procedure: TOTAL SHOULDER REVISION;  Surgeon: Corky Mull, MD;  Location: [facility] ORS;  Service: Orthopedics;  Laterality: Left;   US ECHOCARDIOGRAPHY  04/930   Normal systolic fxn with EF 35-57%.  Focal basal septal hypertrophy.  Mild diastolic dysfunction.  Mild MR.    Current Outpatient Medications:    acetaminophen (TYLENOL) 500 MG tablet, Take 500-1,000 mg by mouth at bedtime. Depends on pain, Disp: , Rfl:    amLODipine (NORVASC) 10 MG tablet, Take 1 tablet (10 mg total) by mouth daily., Disp: 90 tablet, Rfl: 3   bimatoprost (LUMIGAN) 0.01 % SOLN, Place 1 drop into both eyes at bedtime., Disp: , Rfl:    Bioflavonoid Products (VITAMIN C) CHEW, Chew 1 tablet by mouth daily., Disp: , Rfl:    cholecalciferol (VITAMIN D) 1000 units tablet, Take 1,000 Units by mouth daily., Disp: , Rfl:    clonazePAM (KLONOPIN) 0.5 MG tablet, TAKE 1 TABLET BY MOUTH AT BEDTIME AS NEEDED FOR ANXIETY, Disp: 30 tablet, Rfl: 3   fish oil-omega-3 fatty acids 1000 MG capsule, Take 1 g by mouth daily. , Disp: , Rfl:    furosemide (LASIX) 20 MG tablet, TAKE 1 TABLET BY MOUTH ONCE DAILY AS NEEDED FOR  EDEMA, Disp: 30 tablet, Rfl: 3   levothyroxine (SYNTHROID, LEVOTHROID) 100 MCG tablet, Take 1 tablet (100 mcg total) by mouth daily., Disp: 30 tablet, Rfl: 11   Misc Natural Products (OSTEO BI-FLEX TRIPLE STRENGTH PO), Take 1 tablet by mouth daily. With Vit D, Disp: , Rfl:    omeprazole (PRILOSEC) 20 MG capsule, Take 1 capsule (20 mg total) by mouth daily., Disp: 90 capsule, Rfl: 3   ondansetron (ZOFRAN) 4 MG tablet, Take 1 tablet (4 mg total) by mouth every 8 (eight) hours as needed for up to 10 doses for nausea or vomiting., Disp: 10 tablet, Rfl: 0   potassium chloride (K-DUR) 10 MEQ tablet, Take 1 tablet (10 mEq total) by mouth daily as needed (with lasix)., Disp: 30 tablet, Rfl: 3   sertraline (ZOLOFT) 100 MG tablet, Take 1 tablet (100 mg total) by mouth daily., Disp: 90 tablet, Rfl: 1    TURMERIC PO, Take 1 tablet by mouth daily., Disp: , Rfl:    VITAMIN E PO, Take 1 tablet by mouth daily., Disp: , Rfl:    VOLTAREN 1 % GEL, APPLY ONE APPLICATION TOPICALLY 3 TIMES DAILY (Patient taking differently: APPLY ONE APPLICATION TOPICALLY daily as needed for shoulder pain), Disp: 100 g, Rfl: 3   albuterol (PROVENTIL HFA;VENTOLIN HFA) 108 (90 Base) MCG/ACT inhaler, Inhale 2 puffs into the lungs every 6 (six) hours as needed for wheezing or shortness of breath. (Patient not taking: Reported on 07/21/2018), Disp: 1 Inhaler, Rfl: 0   benzonatate (TESSALON) 100 MG capsule, Take 1  capsule (100 mg total) by mouth 3 (three) times daily as needed for cough. (Patient not taking: Reported on 08/15/2018), Disp: 30 capsule, Rfl: 0   HYDROcodone-acetaminophen (NORCO/VICODIN) 5-325 MG tablet, Take 1-2 tablets by mouth every 6 (six) hours as needed for moderate pain or severe pain. (Patient not taking: Reported on 07/21/2018), Disp: 20 tablet, Rfl: 0   methocarbamol (ROBAXIN) 500 MG tablet, Take 1 tablet (500 mg total) by mouth every 6 (six) hours as needed for muscle spasms. (Patient not taking: Reported on 07/21/2018), Disp: 20 tablet, Rfl: 0   Family History  Problem Relation Age of Onset   Diabetes Brother        post-op   Coronary artery disease Brother    Diabetes Paternal Grandfather    Breast cancer Cousin      Social History   Tobacco Use   Smoking status: Never Smoker   Smokeless tobacco: Never Used  Substance Use Topics   Alcohol use: No   Drug use: No    Allergies as of 08/15/2018 - Review Complete 08/15/2018  Allergen Reaction Noted   Prednisone Other (See Comments) 11/12/2015   Ace inhibitors Other (See Comments) 04/02/2008   Alendronate sodium  09/14/2006   Losartan Other (See Comments) 09/26/2014   Other Other (See Comments) 11/12/2015   Paroxetine hcl Other (See Comments) 06/15/2011   Risedronate sodium  03/13/2007   Silenor [doxepin hcl] Other (See  Comments) 04/02/2013   Simvastatin  10/03/2009   Sulfonamide derivatives  06/08/2006   Tramadol Other (See Comments) 11/12/2015   Influenza vaccines Rash and Other (See Comments) 05/24/2014     Imaging Studies: Reviewed  Assessment and Plan:   Betzabeth Derringer is a 85 y.o. female with history of severe diverticulosis, depression, anxiety, insomnia, severe constipation, abdominal bloating followed by antibiotic associated C. difficile diarrhea in 05/2018 s/p 10days of oral vancomycin, 1st recurrence in 06/2018, treated with 2 weeks of Dificid with prolonged taper.  Her diarrhea has currently resolved.  She is currently suffering from constipation  Increase Benefiber to twice daily Continue MiraLAX 1-2 times daily Can recheck stool for C. difficile if diarrhea recurs after finishing course of Dificid   Follow Up Instructions:   I discussed the assessment and treatment plan with the patient. The patient was provided an opportunity to ask questions and all were answered. The patient agreed with the plan and demonstrated an understanding of the instructions.   The patient was advised to call back or seek an in-person evaluation if the symptoms worsen or if the condition fails to improve as anticipated.  I provided 15 minutes of non-face-to-face time during this encounter.   Follow up in 2 months   Cephas Darby, MD

## 2018-09-03 ENCOUNTER — Encounter: Payer: Self-pay | Admitting: Gastroenterology

## 2018-09-04 DIAGNOSIS — I8311 Varicose veins of right lower extremity with inflammation: Secondary | ICD-10-CM | POA: Diagnosis not present

## 2018-09-04 DIAGNOSIS — C44519 Basal cell carcinoma of skin of other part of trunk: Secondary | ICD-10-CM | POA: Diagnosis not present

## 2018-09-04 DIAGNOSIS — L720 Epidermal cyst: Secondary | ICD-10-CM | POA: Diagnosis not present

## 2018-09-04 DIAGNOSIS — D2272 Melanocytic nevi of left lower limb, including hip: Secondary | ICD-10-CM | POA: Diagnosis not present

## 2018-09-04 DIAGNOSIS — L814 Other melanin hyperpigmentation: Secondary | ICD-10-CM | POA: Diagnosis not present

## 2018-09-04 DIAGNOSIS — I8312 Varicose veins of left lower extremity with inflammation: Secondary | ICD-10-CM | POA: Diagnosis not present

## 2018-09-04 DIAGNOSIS — I872 Venous insufficiency (chronic) (peripheral): Secondary | ICD-10-CM | POA: Diagnosis not present

## 2018-09-06 ENCOUNTER — Other Ambulatory Visit: Payer: Self-pay

## 2018-09-06 DIAGNOSIS — A0471 Enterocolitis due to Clostridium difficile, recurrent: Secondary | ICD-10-CM

## 2018-09-06 NOTE — Telephone Encounter (Signed)
Pt daughter left vm  Regarding Dr. Marius Ditch wants pt to have  Labs done for C-diff to insure it is gone she would like confirm  to go to [facility]  For the Labs please call pt daughter Antoine Primas regarding this

## 2018-09-08 ENCOUNTER — Other Ambulatory Visit
Admission: RE | Admit: 2018-09-08 | Discharge: 2018-09-08 | Disposition: A | Discharge status: Home / Self Care | Payer: PPO | Source: Ambulatory Visit | Attending: Gastroenterology | Admitting: Gastroenterology

## 2018-09-08 DIAGNOSIS — A0471 Enterocolitis due to Clostridium difficile, recurrent: Secondary | ICD-10-CM | POA: Insufficient documentation

## 2018-09-08 LAB — C DIFFICILE QUICK SCREEN W PCR REFLEX??: C Diff antigen: NEGATIVE

## 2018-09-08 LAB — C DIFFICILE QUICK SCREEN W PCR REFLEX
C Diff interpretation: NOT DETECTED
C Diff toxin: NEGATIVE

## 2018-10-01 ENCOUNTER — Other Ambulatory Visit: Payer: Self-pay | Admitting: Family Medicine

## 2018-10-02 ENCOUNTER — Telehealth: Payer: Self-pay | Admitting: Family Medicine

## 2018-10-02 MED ORDER — POTASSIUM CHLORIDE ER 10 MEQ PO TBCR: 10.0000 meq | EXTENDED_RELEASE_TABLET | Freq: Every day | ORAL | 3 refills | Status: DC | PRN

## 2018-10-02 NOTE — Telephone Encounter (Signed)
Received refill request from [facility] for potassium 10 MEQ CR tab, last refill sent 02/21/2018 for 4 mo supply. Pt has CPE with Dr. Darnell Level on 10/23/2018. Please advise.

## 2018-10-13 ENCOUNTER — Encounter: Payer: Self-pay | Admitting: Gastroenterology

## 2018-10-15 ENCOUNTER — Other Ambulatory Visit: Payer: Self-pay | Admitting: Family Medicine

## 2018-10-15 DIAGNOSIS — E785 Hyperlipidemia, unspecified: Secondary | ICD-10-CM

## 2018-10-15 DIAGNOSIS — R7303 Prediabetes: Secondary | ICD-10-CM

## 2018-10-15 DIAGNOSIS — M810 Age-related osteoporosis without current pathological fracture: Secondary | ICD-10-CM

## 2018-10-15 DIAGNOSIS — I10 Essential (primary) hypertension: Secondary | ICD-10-CM

## 2018-10-15 DIAGNOSIS — N183 Chronic kidney disease, stage 3 unspecified: Secondary | ICD-10-CM

## 2018-10-15 DIAGNOSIS — E039 Hypothyroidism, unspecified: Secondary | ICD-10-CM

## 2018-10-16 ENCOUNTER — Other Ambulatory Visit (INDEPENDENT_AMBULATORY_CARE_PROVIDER_SITE_OTHER): Payer: PPO

## 2018-10-16 ENCOUNTER — Other Ambulatory Visit: Payer: Self-pay | Admitting: Gastroenterology

## 2018-10-16 ENCOUNTER — Encounter: Payer: Self-pay | Admitting: Gastroenterology

## 2018-10-16 ENCOUNTER — Telehealth: Payer: Self-pay | Admitting: Radiology

## 2018-10-16 DIAGNOSIS — R197 Diarrhea, unspecified: Secondary | ICD-10-CM

## 2018-10-16 DIAGNOSIS — M810 Age-related osteoporosis without current pathological fracture: Secondary | ICD-10-CM | POA: Diagnosis not present

## 2018-10-16 DIAGNOSIS — R7303 Prediabetes: Secondary | ICD-10-CM | POA: Diagnosis not present

## 2018-10-16 DIAGNOSIS — E785 Hyperlipidemia, unspecified: Secondary | ICD-10-CM | POA: Diagnosis not present

## 2018-10-16 DIAGNOSIS — E039 Hypothyroidism, unspecified: Secondary | ICD-10-CM | POA: Diagnosis not present

## 2018-10-16 DIAGNOSIS — I10 Essential (primary) hypertension: Secondary | ICD-10-CM | POA: Diagnosis not present

## 2018-10-16 DIAGNOSIS — N183 Chronic kidney disease, stage 3 unspecified: Secondary | ICD-10-CM

## 2018-10-16 DIAGNOSIS — Z8619 Personal history of other infectious and parasitic diseases: Secondary | ICD-10-CM

## 2018-10-16 LAB — TSH: TSH: 2.45 u[IU]/mL (ref 0.35–4.50)

## 2018-10-16 LAB — CBC WITH DIFFERENTIAL/PLATELET
Basophils Absolute: 0 10*3/uL (ref 0.0–0.1)
Basophils Relative: 0.3 % (ref 0.0–3.0)
Eosinophils Absolute: 0.2 10*3/uL (ref 0.0–0.7)
Eosinophils Relative: 3.6 % (ref 0.0–5.0)
HCT: 40.7 % (ref 36.0–46.0)
Hemoglobin: 13.7 g/dL (ref 12.0–15.0)
Lymphocytes Relative: 41.7 % (ref 12.0–46.0)
Lymphs Abs: 2.4 10*3/uL (ref 0.7–4.0)
MCHC: 33.7 g/dL (ref 30.0–36.0)
MCV: 87.7 fl (ref 78.0–100.0)
Monocytes Absolute: 1.3 10*3/uL — ABNORMAL HIGH (ref 0.1–1.0)
Monocytes Relative: 22.6 % — ABNORMAL HIGH (ref 3.0–12.0)
Neutro Abs: 1.9 10*3/uL (ref 1.4–7.7)
Neutrophils Relative %: 31.8 % — ABNORMAL LOW (ref 43.0–77.0)
Platelets: 287 10*3/uL (ref 150.0–400.0)
RBC: 4.65 Mil/uL (ref 3.87–5.11)
RDW: 14.6 % (ref 11.5–15.5)
WBC: 5.9 10*3/uL (ref 4.0–10.5)

## 2018-10-16 LAB — COMPREHENSIVE METABOLIC PANEL
ALT: 10 U/L (ref 0–35)
AST: 12 U/L (ref 0–37)
Albumin: 3.4 g/dL — ABNORMAL LOW (ref 3.5–5.2)
Alkaline Phosphatase: 92 U/L (ref 39–117)
BUN: 10 mg/dL (ref 6–23)
CO2: 29 mEq/L (ref 19–32)
Calcium: 8.6 mg/dL (ref 8.4–10.5)
Chloride: 101 mEq/L (ref 96–112)
Creatinine, Ser: 0.97 mg/dL (ref 0.40–1.20)
GFR: 54.46 mL/min — ABNORMAL LOW (ref >60.00)
Glucose, Bld: 109 mg/dL — ABNORMAL HIGH (ref 70–99)
Potassium: 3.5 mEq/L (ref 3.5–5.1)
Sodium: 137 mEq/L (ref 135–145)
Total Bilirubin: 0.4 mg/dL (ref 0.2–1.2)
Total Protein: 5.6 g/dL — ABNORMAL LOW (ref 6.0–8.3)

## 2018-10-16 LAB — LIPID PANEL
Cholesterol: 148 mg/dL (ref 0–200)
HDL: 45.1 mg/dL (ref >39.00)
LDL Cholesterol: 73 mg/dL (ref 0–99)
NonHDL: 102.63
Total CHOL/HDL Ratio: 3
Triglycerides: 147 mg/dL (ref 0.0–149.0)
VLDL: 29.4 mg/dL (ref 0.0–40.0)

## 2018-10-16 LAB — MICROALBUMIN / CREATININE URINE RATIO
Creatinine,U: 154.3 mg/dL
Microalb Creat Ratio: 7.1 mg/g (ref 0.0–30.0)
Microalb, Ur: 10.9 mg/dL — ABNORMAL HIGH (ref 0.0–1.9)

## 2018-10-16 LAB — HEMOGLOBIN A1C: Hgb A1c MFr Bld: 6.2 % (ref 4.6–6.5)

## 2018-10-16 LAB — T4, FREE: Free T4: 1.22 ng/dL (ref 0.60–1.60)

## 2018-10-16 LAB — VITAMIN D 25 HYDROXY (VIT D DEFICIENCY, FRACTURES): VITD: 25.01 ng/mL — ABNORMAL LOW (ref 30.00–100.00)

## 2018-10-16 NOTE — Telephone Encounter (Signed)
Patient came in for lab work, said she has been sick x 2 weeks with stomach issues, diarrhea. Wants c diff done. Has tried to get an appt with GI

## 2018-10-16 NOTE — Addendum Note (Signed)
Addended by: Ellamae Sia on: 10/16/2018 04:35 PM   Modules accepted: Orders

## 2018-10-16 NOTE — Telephone Encounter (Signed)
Ok to do C diff- ordered.

## 2018-10-16 NOTE — Telephone Encounter (Signed)
Patient said her daughter finally got an appt with GI, she will be having tests later today. Will call us if she needs anything else.

## 2018-10-16 NOTE — Telephone Encounter (Signed)
Pt left v/m that she had spoken with Terri earlier and pt has another question and request cb from [facility] in lab.

## 2018-10-17 ENCOUNTER — Other Ambulatory Visit: Payer: Self-pay | Admitting: Gastroenterology

## 2018-10-17 ENCOUNTER — Encounter: Payer: Self-pay | Admitting: Gastroenterology

## 2018-10-17 DIAGNOSIS — A0471 Enterocolitis due to Clostridium difficile, recurrent: Secondary | ICD-10-CM

## 2018-10-17 LAB — C. DIFFICILE GDH AND TOXIN A/B
GDH ANTIGEN: DETECTED
MICRO NUMBER:: 593489
SPECIMEN QUALITY:: ADEQUATE
TOXIN A AND B: DETECTED

## 2018-10-17 MED ORDER — FIDAXOMICIN 200 MG PO TABS: 200.0000 mg | ORAL_TABLET | Freq: Two times a day (BID) | ORAL | 0 refills | Status: AC

## 2018-10-17 NOTE — Telephone Encounter (Signed)
Patient came in and dropped off a stool sample

## 2018-10-17 NOTE — Telephone Encounter (Signed)
error 

## 2018-10-18 ENCOUNTER — Telehealth: Payer: Self-pay | Admitting: Gastroenterology

## 2018-10-18 ENCOUNTER — Other Ambulatory Visit: Payer: Self-pay | Admitting: Gastroenterology

## 2018-10-18 ENCOUNTER — Encounter: Payer: Self-pay | Admitting: Gastroenterology

## 2018-10-18 DIAGNOSIS — A0471 Enterocolitis due to Clostridium difficile, recurrent: Secondary | ICD-10-CM

## 2018-10-18 MED ORDER — VANCOMYCIN HCL 125 MG PO CAPS: 125.0000 mg | ORAL_CAPSULE | Freq: Four times a day (QID) | ORAL | 0 refills | Status: DC

## 2018-10-18 NOTE — Telephone Encounter (Signed)
Prescription for Dificid 200 mg has been faxed as URGENT to [facility], pt's daughter has been notified and verbalized understanding

## 2018-10-18 NOTE — Telephone Encounter (Signed)
Brooke Beasley called for patient & states the medication she was on for c-diff now her co-pay is $600. The need something else & today if possible called in to [facility] on [address]  The daughter will be out of reach from 1:30-3:30.

## 2018-10-19 ENCOUNTER — Telehealth: Payer: Self-pay

## 2018-10-19 NOTE — Telephone Encounter (Signed)
C. Diff testing with reflex 10-16-18 final stool GDH antigen was detected. Toxin A and B detected. C. Diff detected. They will be faxing the results

## 2018-10-19 NOTE — Telephone Encounter (Signed)
This has been taken care of.

## 2018-10-20 ENCOUNTER — Ambulatory Visit (INDEPENDENT_AMBULATORY_CARE_PROVIDER_SITE_OTHER): Payer: PPO

## 2018-10-20 DIAGNOSIS — Z0000 Encounter for general adult medical examination without abnormal findings: Secondary | ICD-10-CM | POA: Diagnosis not present

## 2018-10-20 NOTE — Progress Notes (Signed)
Subjective:   Brooke Beasley is a 85 y.o. female who presents for Medicare Annual (Subsequent) preventive examination.  Review of Systems:  N/A Cardiac Risk Factors include: advanced age (>34men, >40 women);dyslipidemia     Objective:     Vitals: There were no vitals taken for this visit.  There is no height or weight on file to calculate BMI.  Advanced Directives 10/20/2018 05/28/2018 10/13/2017 05/26/2017 01/27/2017 10/08/2016 11/27/2015  Does Patient Have a Medical Advance Directive? Yes Yes Yes Yes Yes Yes Yes  Type of Paramedic of [place];Living will [place];Living will [place];Living will [place];Living will - [place];Living will Living will  Does patient want to make changes to medical advance directive? No - Patient declined - - - - - No - Patient declined  Copy of [place] in Chart? No - copy requested - No - copy requested - - No - copy requested Yes  Pre-existing out of facility DNR order (yellow form or pink MOST form) - - - - - - -    Tobacco Social History   Tobacco Use  Smoking Status Never Smoker  Smokeless Tobacco Never Used     Counseling given: No   Clinical Intake:  Pre-visit preparation completed: Yes  Pain : No/denies pain Pain Score: 0-No pain     Nutritional Status: BMI 25 -29 Overweight Nutritional Risks: None Diabetes: No  How often do you need to have someone help you when you read instructions, pamphlets, or other written materials from your doctor or pharmacy?: 1 - Never What is the last grade level you completed in school?: 12th grade  Interpreter Needed?: No  Comments: pt is a widow and lives independently Information entered by :: LPinson, RN  Past Medical History:  Diagnosis Date  . Anxiety   . CAP (community acquired pneumonia) 12/22/2017  . Carotid stenosis 10/18/2014   R 1-39%, L 40-59%, rpt 1 yr  (09/2014)   . CKD (chronic kidney disease) stage 3, GFR 30-59 ml/min (HCC) 08/02/2014  . Degenerative disc disease    LS  . Depression    nervous breakdonw in 1964-out of work for a year  . Diverticulosis    severe by colonoscopy  . Family history of adverse reaction to anesthesia    n/v  . GERD (gastroesophageal reflux disease)   . Glaucoma   . Heart murmur 5/13   mitral regurge - on echo   . History of hiatal hernia   . History of shingles   . HTN (hypertension)   . Hyperlipidemia   . Hypertension   . Hypothyroidism   . IBS 11/02/2006  . Intestinal bacterial overgrowth    In small colon  . Maxillary fracture ([place]) 04/08/2012  . Osteoarthritis 2016   Jefm [place])  . Osteoporosis    dexa 2011  . Pseudogout 2016   shoulders ([place])   Past Surgical History:  Procedure Laterality Date  . barium enema  2012   severe diverticulosis, redundant colon  . Bowel obstruction  1999   no surgery in hosp x 3 days  . COLONOSCOPY  1. 1999  2. 11/04   1. Not finished  2. Slight hemorrhage rectosigmoid area, severe sig diverticulosis  . Dexa  1. 4967-5916   2. 9/04  3. 3/08    1. OP  2. OP, borderline, spine -2.44T  3. decreased BMD-OP  . DEXA  2011   OP in spine  .  DG KNEE 1-2 VIEWS BILAT     LS x-ray with degenerative disc and facet change  . ESOPHAGOGASTRODUODENOSCOPY     Negative  . Hemorrhoid procedure  4/08  . JOINT REPLACEMENT    . TONSILLECTOMY    . TOTAL SHOULDER ARTHROPLASTY Left 11/27/2015   Corky Mull, MD  . TOTAL SHOULDER REVISION Left 03/16/2016   Procedure: TOTAL SHOULDER REVISION;  Surgeon: Corky Mull, MD;  Location: [facility] ORS;  Service: Orthopedics;  Laterality: Left;  . US ECHOCARDIOGRAPHY  07/84   Normal systolic fxn with EF 76-19%.  Focal basal septal hypertrophy.  Mild diastolic dysfunction.  Mild MR.   Family History  Problem Relation Age of Onset  . Diabetes Brother        post-op  . Coronary artery disease Brother   . Diabetes Paternal Grandfather   .  Breast cancer Cousin    Social History   Socioeconomic History  . Marital status: Married    Spouse name: Not on file  . Number of children: 2  . Years of education: Not on file  . Highest education level: Not on file  Occupational History  . Occupation: Retired    Fish farm manager: RETIRED  Social Needs  . Financial resource strain: Not on file  . Food insecurity    Worry: Not on file    Inability: Not on file  . Transportation needs    Medical: Not on file    Non-medical: Not on file  Tobacco Use  . Smoking status: Never Smoker  . Smokeless tobacco: Never Used  Substance and Sexual Activity  . Alcohol use: No  . Drug use: No  . Sexual activity: Not Currently  Lifestyle  . Physical activity    Days per week: Not on file    Minutes per session: Not on file  . Stress: Not on file  Relationships  . Social Herbalist on phone: Not on file    Gets together: Not on file    Attends religious service: Not on file    Active member of club or organization: Not on file    Attends meetings of clubs or organizations: Not on file    Relationship status: Not on file  Other Topics Concern  . Not on file  Social History Narrative   Left handed   Widow. Husband ([name]) deceased April 15, 2016 from dementia. She was caregiver.    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 acetaminophen (TYLENOL) 500 MG tablet Take 500-1,000 mg by mouth at bedtime. Depends on pain  . amLODipine (NORVASC) 10 MG tablet Take 1 tablet (10 mg total) by mouth daily.  . bimatoprost (LUMIGAN) 0.01 % SOLN Place 1 drop into both eyes at bedtime.  Marland Kitchen Bioflavonoid Products (VITAMIN C) CHEW Chew 1 tablet by mouth daily.  . cholecalciferol (VITAMIN D) 1000 units tablet Take 1,000 Units by mouth daily.   . clonazePAM (KLONOPIN) 0.5 MG tablet TAKE 1 TABLET BY MOUTH AT BEDTIME AS NEEDED FOR ANXIETY  . fidaxomicin (DIFICID) 200 MG TABS tablet Take 1 tablet (200 mg total) by mouth 2 (two) times daily with a meal for 30 days.  . fish oil-omega-3 fatty acids 1000 MG capsule Take 1 g by mouth daily.   Marland Kitchen  furosemide (LASIX) 20 MG tablet TAKE 1 TABLET BY MOUTH ONCE DAILY AS NEEDED FOR  EDEMA  . levothyroxine (SYNTHROID, LEVOTHROID) 100 MCG tablet Take 1 tablet (100 mcg total) by mouth daily.  . Misc Natural Products (OSTEO BI-FLEX TRIPLE STRENGTH PO) Take 1 tablet by mouth daily. With Vit D  . omeprazole (PRILOSEC) 20 MG capsule Take 1 capsule (20 mg total) by mouth daily.  . ondansetron (ZOFRAN) 4 MG tablet Take 1 tablet (4 mg total) by mouth every 8 (eight) hours as needed for up to 10 doses for nausea or vomiting.  . potassium chloride (K-DUR) 10 MEQ tablet Take 1 tablet (10 mEq total) by mouth daily as needed (with lasix).  Marland Kitchen sertraline (ZOLOFT) 100 MG tablet Take 1 tablet (100 mg total) by mouth daily.  . TURMERIC PO Take 1 tablet by mouth daily.  . vancomycin (VANCOCIN) 125 MG capsule Take 1 capsule (125 mg total) by mouth 4 (four) times daily for 14 days.  Marland Kitchen VITAMIN E PO Take 1 tablet by mouth daily.  . VOLTAREN 1 % GEL APPLY ONE APPLICATION TOPICALLY 3 TIMES DAILY (Patient taking differently: APPLY ONE APPLICATION TOPICALLY daily as needed for shoulder pain)  . albuterol (PROVENTIL HFA;VENTOLIN HFA) 108 (90 Base) MCG/ACT inhaler Inhale 2 puffs into the lungs every 6 (six) hours as needed for wheezing or shortness of breath. (Patient not taking: Reported on 07/21/2018)  . benzonatate (TESSALON) 100 MG capsule Take 1 capsule (100 mg total) by mouth 3 (three) times daily as needed for cough. (Patient not taking: Reported on 08/15/2018)  . HYDROcodone-acetaminophen (NORCO/VICODIN) 5-325 MG tablet Take 1-2 tablets by mouth every 6 (six) hours as needed for moderate pain or severe pain. (Patient not taking:  Reported on 07/21/2018)  . methocarbamol (ROBAXIN) 500 MG tablet Take 1 tablet (500 mg total) by mouth every 6 (six) hours as needed for muscle spasms. (Patient not taking: Reported on 07/21/2018)   No facility-administered encounter medications on file as of 10/20/2018.     Activities of Daily Living In your present state of health, do you have any difficulty performing the following activities: 10/20/2018  Hearing? Y  Vision? N  Difficulty concentrating or making decisions? N  Walking or climbing stairs? Y  Dressing or bathing? N  Doing errands, shopping? N  Preparing Food and eating ? N  Using the Toilet? N  In the past six months, have you accidently leaked urine? N  Do you have problems with loss of bowel control? Y  Managing your Medications? N  Managing your Finances? N  Housekeeping or managing your Housekeeping? N  Some recent data might be hidden    Patient Care Team: [name], MD as PCP - General (Family Medicine)    Assessment:   This is a routine wellness examination for Bela.   Hearing Screening   125Hz  250Hz  500Hz  1000Hz  2000Hz  3000Hz  4000Hz  6000Hz  8000Hz   Right ear:           Left ear:           Vision Screening Comments: Vision exam in Jan 2020 with Dr. Kieth Brightly   Exercise Activities and Dietary recommendations Current Exercise Habits: The patient does not participate in regular exercise at present, Exercise limited by: None identified  Goals    . Patient Stated     Starting 10/20/18, I will continue to take medications as prescribed.        Fall Risk Fall Risk  10/20/2018 10/13/2017 10/08/2016 10/07/2015 09/26/2014  Falls in  the past year? 0 Yes No No No  Comment - pt fell after losing balance - - -  Number falls in past yr: - 1 - - -  Injury with Fall? - No - - -  Risk for fall due to : - Impaired balance/gait - - -   Depression Screen PHQ 2/9 Scores 10/20/2018 10/13/2017 10/08/2016 10/07/2015  PHQ - 2 Score 0 2 1 0  PHQ- 9 Score 0 11 7 -      Cognitive Function MMSE - Mini Mental State Exam 10/20/2018 10/13/2017 10/08/2016  Orientation to time 5 5 5   Orientation to Place 5 5 5   Registration 3 3 3   Attention/ Calculation 0 0 0  Recall 3 1 3   Recall-comments - unable to recall 2 of 3 words -  Language- name 2 objects 0 0 0  Language- repeat 1 1 1   Language- follow 3 step command 0 3 3  Language- read & follow direction 0 0 0  Write a sentence 0 0 0  Copy design 0 0 0  Total score 17 18 20      PLEASE NOTE: A Mini-Cog screen was completed. Maximum score is 17. A value of 0 denotes this part of Folstein MMSE was not completed or the patient failed this part of the Mini-Cog screening.   Mini-Cog Screening Orientation to Time - Max 5 pts Orientation to Place - Max 5 pts Registration - Max 3 pts Recall - Max 3 pts Language Repeat - Max 1 pts      Immunization History  Administered Date(s) Administered  . Pneumococcal Conjugate-13 06/08/2013  . Pneumococcal Polysaccharide-23 04/26/2002  . Td 04/26/1998, 10/22/2008  . Zoster 05/11/2010    Screening Tests Health Maintenance  Topic Date Due  . DTaP/Tdap/Td (1 - Tdap) 10/23/2018 (Originally 11/21/1949)  . MAMMOGRAM  04/26/2019 (Originally 09/01/2017)  . TETANUS/TDAP  10/23/2018  . INFLUENZA VACCINE  11/25/2018  . DEXA SCAN  Completed  . PNA vac Low Risk Adult  Completed       Plan:     I have personally reviewed, addressed, and noted the following in the patient's chart:  A. Medical and social history B. Use of alcohol, tobacco or illicit drugs  C. Current medications and supplements D. Functional ability and status E.  Nutritional status F.  Physical activity G. Advance directives H. List of other physicians I.  Hospitalizations, surgeries, and ER visits in previous 12 months J.  Vitals (unless it is a telemedicine encounter) K. Screenings to include cognitive, depression, hearing, vision (NOTE: hearing and vision screenings not completed in telemedicine  encounter) L. Referrals and appointments   In addition, I have reviewed and discussed with patient certain preventive protocols, quality metrics, and best practice recommendations. A written personalized care plan for preventive services and recommendations were provided to patient.  With patient's permission, we connected on 10/20/18 at 10:00 AM EDT. Interactive audio and video telecommunications were attempted with patient. This attempt was unsuccessful due to patient having technical difficulties OR patient did not have access to video capability.  Encounter was completed with audio only.  Two patient identifiers were used to ensure the encounter occurred with the correct person. Patient was in home and writer was in office.     Signed,   Lindell Noe, MHA, BS, RN Health Coach

## 2018-10-20 NOTE — Patient Instructions (Signed)
Ms. Talsma , Thank you for taking time to come for your Medicare Wellness Visit. I appreciate your ongoing commitment to your health goals. Please review the following plan we discussed and let me know if I can assist you in the future.   These are the goals we discussed: Goals    . Patient Stated     Starting 10/20/18, I will continue to take medications as prescribed.        This is a list of the screening recommended for you and due dates:  Health Maintenance  Topic Date Due  . DTaP/Tdap/Td vaccine (1 - Tdap) 10/23/2018*  . Mammogram  04/26/2019*  . Tetanus Vaccine  10/23/2018  . Flu Shot  11/25/2018  . DEXA scan (bone density measurement)  Completed  . Pneumonia vaccines  Completed  *Topic was postponed. The date shown is not the original due date.   Preventive Care for Adults  A healthy lifestyle and preventive care can promote health and wellness. Preventive health guidelines for adults include the following key practices.  . A routine yearly physical is a good way to check with your health care provider about your health and preventive screening. It is a chance to share any concerns and updates on your health and to receive a thorough exam.  . Visit your dentist for a routine exam and preventive care every 6 months. Brush your teeth twice a day and floss once a day. Good oral hygiene prevents tooth decay and gum disease.  . The frequency of eye exams is based on your age, health, family medical history, use  of contact lenses, and other factors. Follow your health care provider's recommendations for frequency of eye exams.  . Eat a healthy diet. Foods like vegetables, fruits, whole grains, low-fat dairy products, and lean protein foods contain the nutrients you need without too many calories. Decrease your intake of foods high in solid fats, added sugars, and salt. Eat the right amount of calories for you. Get information about a proper diet from your health care provider, if  necessary.  . Regular physical exercise is one of the most important things you can do for your health. Most adults should get at least 150 minutes of moderate-intensity exercise (any activity that increases your heart rate and causes you to sweat) each week. In addition, most adults need muscle-strengthening exercises on 2 or more days a week.  Silver Sneakers may be a benefit available to you. To determine eligibility, you may visit the website: www.silversneakers.com or contact program at 220 253 2501 Mon-Fri between 8AM-8PM.   . Maintain a healthy weight. The body mass index (BMI) is a screening tool to identify possible weight problems. It provides an estimate of body fat based on height and weight. Your health care provider can find your BMI and can help you achieve or maintain a healthy weight.   For adults 20 years and older: ? A BMI below 18.5 is considered underweight. ? A BMI of 18.5 to 24.9 is normal. ? A BMI of 25 to 29.9 is considered overweight. ? A BMI of 30 and above is considered obese.   . Maintain normal blood lipids and cholesterol levels by exercising and minimizing your intake of saturated fat. Eat a balanced diet with plenty of fruit and vegetables. Blood tests for lipids and cholesterol should begin at age 67 and be repeated every 5 years. If your lipid or cholesterol levels are high, you are over 50, or you are at high risk for  heart disease, you may need your cholesterol levels checked more frequently. Ongoing high lipid and cholesterol levels should be treated with medicines if diet and exercise are not working.  . If you smoke, find out from your health care provider how to quit. If you do not use tobacco, please do not start.  . If you choose to drink alcohol, please do not consume more than 2 drinks per day. One drink is considered to be 12 ounces (355 mL) of beer, 5 ounces (148 mL) of wine, or 1.5 ounces (44 mL) of liquor.  . If you are 47-12 years old, ask your  health care provider if you should take aspirin to prevent strokes.  . Use sunscreen. Apply sunscreen liberally and repeatedly throughout the day. You should seek shade when your shadow is shorter than you. Protect yourself by wearing long sleeves, pants, a wide-brimmed hat, and sunglasses year round, whenever you are outdoors.  . Once a month, do a whole body skin exam, using a mirror to look at the skin on your back. Tell your health care provider of new moles, moles that have irregular borders, moles that are larger than a pencil eraser, or moles that have changed in shape or color.

## 2018-10-20 NOTE — Progress Notes (Signed)
PCP notes:   Health maintenance:  Mammogram - to be determined  Abnormal screenings:   None  Patient concerns:   Recovering from C-Diff. Currently on antibiotics.   Nurse concerns:  None  Next PCP appt:   10/23/18 @ 1030

## 2018-10-21 ENCOUNTER — Other Ambulatory Visit: Payer: Self-pay | Admitting: Family Medicine

## 2018-10-23 ENCOUNTER — Ambulatory Visit (INDEPENDENT_AMBULATORY_CARE_PROVIDER_SITE_OTHER): Payer: PPO | Admitting: Family Medicine

## 2018-10-23 ENCOUNTER — Encounter: Payer: Self-pay | Admitting: Family Medicine

## 2018-10-23 ENCOUNTER — Ambulatory Visit: Payer: PPO

## 2018-10-23 ENCOUNTER — Other Ambulatory Visit: Payer: Self-pay

## 2018-10-23 VITALS — BP 122/74 | HR 66 | Temp 97.6°F | Ht 61.5 in | Wt 154.5 lb

## 2018-10-23 DIAGNOSIS — K5909 Other constipation: Secondary | ICD-10-CM

## 2018-10-23 DIAGNOSIS — E039 Hypothyroidism, unspecified: Secondary | ICD-10-CM | POA: Diagnosis not present

## 2018-10-23 DIAGNOSIS — M810 Age-related osteoporosis without current pathological fracture: Secondary | ICD-10-CM

## 2018-10-23 DIAGNOSIS — Z0000 Encounter for general adult medical examination without abnormal findings: Secondary | ICD-10-CM | POA: Diagnosis not present

## 2018-10-23 DIAGNOSIS — K581 Irritable bowel syndrome with constipation: Secondary | ICD-10-CM

## 2018-10-23 DIAGNOSIS — E785 Hyperlipidemia, unspecified: Secondary | ICD-10-CM | POA: Diagnosis not present

## 2018-10-23 DIAGNOSIS — F418 Other specified anxiety disorders: Secondary | ICD-10-CM | POA: Diagnosis not present

## 2018-10-23 DIAGNOSIS — I10 Essential (primary) hypertension: Secondary | ICD-10-CM

## 2018-10-23 DIAGNOSIS — M150 Primary generalized (osteo)arthritis: Secondary | ICD-10-CM | POA: Diagnosis not present

## 2018-10-23 DIAGNOSIS — G8929 Other chronic pain: Secondary | ICD-10-CM

## 2018-10-23 DIAGNOSIS — N183 Chronic kidney disease, stage 3 unspecified: Secondary | ICD-10-CM

## 2018-10-23 DIAGNOSIS — A0472 Enterocolitis due to Clostridium difficile, not specified as recurrent: Secondary | ICD-10-CM

## 2018-10-23 DIAGNOSIS — R011 Cardiac murmur, unspecified: Secondary | ICD-10-CM | POA: Diagnosis not present

## 2018-10-23 DIAGNOSIS — K219 Gastro-esophageal reflux disease without esophagitis: Secondary | ICD-10-CM | POA: Diagnosis not present

## 2018-10-23 DIAGNOSIS — R109 Unspecified abdominal pain: Secondary | ICD-10-CM

## 2018-10-23 DIAGNOSIS — R7303 Prediabetes: Secondary | ICD-10-CM

## 2018-10-23 DIAGNOSIS — M159 Polyosteoarthritis, unspecified: Secondary | ICD-10-CM

## 2018-10-23 MED ORDER — HYDROCORTISONE (PERIANAL) 2.5 % EX CREA: 1.0000 "application " | TOPICAL_CREAM | Freq: Two times a day (BID) | CUTANEOUS | 0 refills | Status: DC

## 2018-10-23 NOTE — Assessment & Plan Note (Signed)
Marked osteoporosis with T score -4.2 (forearm) at last DEXA. Continues vit D 1000 IU daily.  Has not tolerated oral bisphosphonates in the past (palpitations, joint pains). Avoids calcium due to worsened constipation while on it.  Discussed options, recommended prolia - she and daughter will look into this medication and let us know if desire to try.

## 2018-10-23 NOTE — Assessment & Plan Note (Signed)
Stable period on sertraline and klonopin.

## 2018-10-23 NOTE — Assessment & Plan Note (Addendum)
Per daughter this is 3rd episode of C diff diarrheal illness. Currently on vancomycin while GI tries to get dificid abx approved for treatment. Notes improvement. They will f/u with GI for further recommendations.

## 2018-10-23 NOTE — Assessment & Plan Note (Signed)
Encouraged continue avoiding added sugars in the diet.

## 2018-10-23 NOTE — Assessment & Plan Note (Signed)
Preventative protocols reviewed and updated unless pt declined. Discussed healthy diet and lifestyle.  

## 2018-10-23 NOTE — Assessment & Plan Note (Signed)
Harsh sounding. Presumed aortic sclerosis. 2D echo 2013 reassuring. Continue to monitor.

## 2018-10-23 NOTE — Assessment & Plan Note (Signed)
Chronic, stable on current regimen - continue levothyroxine 123mcg daily.

## 2018-10-23 NOTE — Assessment & Plan Note (Signed)
She is taking osteo bi flex and vitamin D.

## 2018-10-23 NOTE — Assessment & Plan Note (Signed)
Continues omeprazole 53m daily.

## 2018-10-23 NOTE — Progress Notes (Signed)
I reviewed health advisor's note, was available for consultation, and agree with documentation and plan.  

## 2018-10-23 NOTE — Assessment & Plan Note (Signed)
Chronic, stable off statin.  The ASCVD Risk score (Goff DC Jr., et al., 2013) failed to calculate for the following reasons:   The 2013 ASCVD risk score is only valid for ages 40 to 79  

## 2018-10-23 NOTE — Progress Notes (Signed)
This visit was conducted in person.  BP 122/74 (BP Location: Left Arm, Patient Position: Sitting, Cuff Size: Normal)    Pulse 66    Temp 97.6 F (36.4 C) (Temporal)    Ht 5' 1.5" (1.562 m)    Wt 154 lb 8 oz (70.1 kg)    SpO2 97%    BMI 28.72 kg/m    CC: CPE Subjective:    Patient ID: Brooke Beasley, female    DOB: July 21, 1930, 85 y.o.   MRN: [id number]  HPI: Brooke Beasley is a 85 y.o. female presenting on 10/23/2018 for Annual Exam (Pt 2. Pt accompanied by daughter, Brooke Beasley. )   Annia Beasley last week for medicare wellness visit. Note reviewed.   Recent C diff positive - receiving oral vancomycin treatment through [facility]. Looking for financial assistance for fidaxomicin.   Struggling with bleeding internal hemorrhoids - using anusol OTC with some benefit. Has also previously tried witch hazel.     Preventative: Colonoscopy 2004 - severe sigmoid diverticulosis. No recent blood in stool. Taking probiotics for IBS. Barium enema Date: 2012 severe diverticulosis, redundant colon.CT colonography was ok 2018. Mammogram5/2018- WNL. Due for repeat.  DEXA - 2011 osteoporosis, rpt DEXA forearm T -4.2, hip -1.4 (01/2018). Pt does not take calcium (causes constipation) but does take vit D.Fosamax caused bone pain.  Well woman - last done about 12 yrs ago.Aged out Flu shot - declines Pneumovax - 2004. prevnar 05/2013.  Td 2010 Zostavax 2012 Shingrix - discussed  Advanced directives - scanned into chart 10/2015.Daughter Antoine Beasley then Brooke Beasley are HCPOA. Does not want life sustaining measures if terminally ill. Seat Beasley use discussed Sunscreen use discussed. No changing moles on skin.  Non smoker Alcohol - none Dentist - does not see, has full dentures Eye exam - Q6 mo - glaucoma Bowel - chronic issue Bladder - no significant accidents   Left handed Widow. Husband (Brooke Beasley) deceased 04/02/2016 from dementia. She was caregiver.  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 Interim medical history since our last visit reviewed. Allergies and medications reviewed and updated. Outpatient Medications Prior to Visit  Medication Sig Dispense Refill   acetaminophen (TYLENOL) 500 MG tablet Take 500-1,000 mg by mouth at bedtime. Depends on pain     albuterol (PROVENTIL HFA;VENTOLIN HFA) 108 (90 Base) MCG/ACT inhaler Inhale 2 puffs into the lungs every 6 (six) hours as needed for wheezing or shortness of breath. 1 Inhaler 0   amLODipine (NORVASC) 10 MG tablet Take 1 tablet (10 mg total) by mouth daily. 90 tablet 3   bimatoprost (LUMIGAN) 0.01 % SOLN Place 1 drop into both eyes at bedtime.     Bioflavonoid Products (VITAMIN C) CHEW Chew 1 tablet by mouth daily.     cholecalciferol (VITAMIN D) 1000 units tablet Take 1,000 Units by mouth daily.     clonazePAM (KLONOPIN) 0.5 MG tablet TAKE 1 TABLET BY MOUTH AT BEDTIME AS NEEDED FOR ANXIETY 30 tablet 3   fish oil-omega-3 fatty acids 1000 MG capsule Take 1 g by mouth daily.      furosemide (LASIX) 20 MG tablet TAKE 1 TABLET BY MOUTH ONCE DAILY AS NEEDED FOR EDEMA 90 tablet 0   HYDROcodone-acetaminophen (NORCO/VICODIN) 5-325 MG tablet Take 1-2 tablets by mouth  every 6 (six) hours as needed for moderate pain or severe pain. 20 tablet 0   levothyroxine (SYNTHROID, LEVOTHROID) 100 MCG tablet Take 1 tablet (100 mcg total) by mouth daily. 30 tablet 11   methocarbamol (ROBAXIN) 500 MG tablet Take 1 tablet (500 mg total) by mouth every 6 (six) hours as needed for muscle spasms. 20 tablet 0   Misc Natural Products (OSTEO BI-FLEX TRIPLE STRENGTH PO) Take 1 tablet by mouth daily. With Vit D     omeprazole (PRILOSEC) 20 MG capsule Take 1 capsule (20 mg total) by mouth  daily. 90 capsule 3   potassium chloride (K-DUR) 10 MEQ tablet Take 1 tablet (10 mEq total) by mouth daily as needed (with lasix). 30 tablet 3   sertraline (ZOLOFT) 100 MG tablet Take 1 tablet (100 mg total) by mouth daily. 90 tablet 1   TURMERIC PO Take 1 tablet by mouth daily.     vancomycin (VANCOCIN) 125 MG capsule Take 1 capsule (125 mg total) by mouth 4 (four) times daily for 14 days. 56 capsule 0   VITAMIN E PO Take 1 tablet by mouth daily.     VOLTAREN 1 % GEL APPLY ONE APPLICATION TOPICALLY 3 TIMES DAILY (Patient taking differently: APPLY ONE APPLICATION TOPICALLY daily as needed for shoulder pain) 100 g 3   fidaxomicin (DIFICID) 200 MG TABS tablet Take 1 tablet (200 mg total) by mouth 2 (two) times daily with a meal for 30 days. (Patient not taking: Reported on 10/23/2018) 60 tablet 0   benzonatate (TESSALON) 100 MG capsule Take 1 capsule (100 mg total) by mouth 3 (three) times daily as needed for cough. (Patient not taking: Reported on 08/15/2018) 30 capsule 0   ondansetron (ZOFRAN) 4 MG tablet Take 1 tablet (4 mg total) by mouth every 8 (eight) hours as needed for up to 10 doses for nausea or vomiting. 10 tablet 0   No facility-administered medications prior to visit.      Per HPI unless specifically indicated in ROS section below Review of Systems  Constitutional: Positive for chills, diaphoresis (night sweats attributed to C diff), fatigue and fever. Negative for activity change, appetite change and unexpected weight change.  HENT: Negative for hearing loss.   Eyes: Negative for visual disturbance.  Respiratory: Positive for shortness of breath (mild). Negative for cough, chest tightness and wheezing.   Cardiovascular: Positive for leg swelling. Negative for chest pain and palpitations.  Gastrointestinal: Positive for abdominal pain, blood in stool (attributed to hemorrhoids), constipation, diarrhea, nausea and vomiting. Negative for abdominal distention.  Genitourinary:  Negative for difficulty urinating and hematuria.  Musculoskeletal: Negative for arthralgias, myalgias and neck pain.       Feet get hot at night, no paresthesias or numbness or burning pain  Skin: Negative for rash.  Neurological: Positive for dizziness. Negative for seizures, syncope and headaches.  Hematological: Negative for adenopathy. Bruises/bleeds easily (mild).  Psychiatric/Behavioral: Positive for dysphoric Beasley (stable). The patient is not nervous/anxious.    Objective:    BP 122/74 (BP Location: Left Arm, Patient Position: Sitting, Cuff Size: Normal)    Pulse 66    Temp 97.6 F (36.4 C) (Temporal)    Ht 5' 1.5" (1.562 m)    Wt 154 lb 8 oz (70.1 kg)    SpO2 97%    BMI 28.72 kg/m   Wt Readings from Last 3 Encounters:  10/23/18 154 lb 8 oz (70.1 kg)  07/11/18 158 lb 7 oz (71.9 kg)  06/05/18 160 lb 3.2  oz (72.7 kg)    Physical Exam Vitals signs and nursing note reviewed.  Constitutional:      General: She is not in acute distress.    Appearance: She is well-developed.  HENT:     Head: Normocephalic and atraumatic.     Right Ear: Hearing, tympanic membrane, ear canal and external ear normal.     Left Ear: Hearing, tympanic membrane, ear canal and external ear normal.     Nose: Nose normal.     Mouth/Throat:     Mouth: Mucous membranes are moist.     Pharynx: Uvula midline. No oropharyngeal exudate or posterior oropharyngeal erythema.  Eyes:     General: No scleral icterus.    Extraocular Movements: Extraocular movements intact.     Conjunctiva/sclera: Conjunctivae normal.     Pupils: Pupils are equal, round, and reactive to light.  Neck:     Musculoskeletal: Normal range of motion and neck supple.     Vascular: No carotid bruit (referred sound from heart murmur).  Cardiovascular:     Rate and Rhythm: Normal rate and regular rhythm.     Pulses: Normal pulses.          Radial pulses are 2+ on the right side and 2+ on the left side.     Heart sounds: Murmur (4/6 systolic  best at USB) present.  Pulmonary:     Effort: Pulmonary effort is normal. No respiratory distress.     Breath sounds: Normal breath sounds. No wheezing, rhonchi or rales.  Abdominal:     General: Abdomen is flat. Bowel sounds are normal. There is no distension.     Palpations: Abdomen is soft. There is no mass.     Tenderness: There is generalized abdominal tenderness (mild). There is no guarding or rebound.     Hernia: No hernia is present.  Musculoskeletal: Normal range of motion.  Lymphadenopathy:     Cervical: No cervical adenopathy.  Skin:    General: Skin is warm and dry.     Findings: No rash.  Neurological:     General: No focal deficit present.     Mental Status: She is alert and oriented to person, place, and time.     Comments: CN grossly intact, station and gait intact  Psychiatric:        Beasley and Affect: Beasley normal.        Behavior: Behavior normal.        Thought Content: Thought content normal.        Judgment: Judgment normal.       Results for orders placed or performed in visit on 10/16/18  CBC with Differential/Platelet  Result Value Ref Range   WBC 5.9 4.0 - 10.5 K/uL   RBC 4.65 3.87 - 5.11 Mil/uL   Hemoglobin 13.7 12.0 - 15.0 g/dL   HCT 40.7 36.0 - 46.0 %   MCV 87.7 78.0 - 100.0 fl   MCHC 33.7 30.0 - 36.0 g/dL   RDW 14.6 11.5 - 15.5 %   Platelets 287.0 150.0 - 400.0 K/uL   Neutrophils Relative % 31.8 (L) 43.0 - 77.0 %   Lymphocytes Relative 41.7 12.0 - 46.0 %   Monocytes Relative 22.6 Repeated and verified X2. (H) 3.0 - 12.0 %   Eosinophils Relative 3.6 0.0 - 5.0 %   Basophils Relative 0.3 0.0 - 3.0 %   Neutro Abs 1.9 1.4 - 7.7 K/uL   Lymphs Abs 2.4 0.7 - 4.0 K/uL   Monocytes Absolute 1.3 (  H) 0.1 - 1.0 K/uL   Eosinophils Absolute 0.2 0.0 - 0.7 K/uL   Basophils Absolute 0.0 0.0 - 0.1 K/uL  Hemoglobin A1c  Result Value Ref Range   Hgb A1c MFr Bld 6.2 4.6 - 6.5 %  VITAMIN D 25 Hydroxy (Vit-D Deficiency, Fractures)  Result Value Ref Range   VITD  25.01 (L) 30.00 - 100.00 ng/mL  Microalbumin / creatinine urine ratio  Result Value Ref Range   Microalb, Ur 10.9 (H) 0.0 - 1.9 mg/dL   Creatinine,U 154.3 mg/dL   Microalb Creat Ratio 7.1 0.0 - 30.0 mg/g  Lipid panel  Result Value Ref Range   Cholesterol 148 0 - 200 mg/dL   Triglycerides 147.0 0.0 - 149.0 mg/dL   HDL 45.10 >39.00 mg/dL   VLDL 29.4 0.0 - 40.0 mg/dL   LDL Cholesterol 73 0 - 99 mg/dL   Total CHOL/HDL Ratio 3    NonHDL 102.63   TSH  Result Value Ref Range   TSH 2.45 0.35 - 4.50 uIU/mL  Comprehensive metabolic panel  Result Value Ref Range   Sodium 137 135 - 145 mEq/L   Potassium 3.5 3.5 - 5.1 mEq/L   Chloride 101 96 - 112 mEq/L   CO2 29 19 - 32 mEq/L   Glucose, Bld 109 (H) 70 - 99 mg/dL   BUN 10 6 - 23 mg/dL   Creatinine, Ser 0.97 0.40 - 1.20 mg/dL   Total Bilirubin 0.4 0.2 - 1.2 mg/dL   Alkaline Phosphatase 92 39 - 117 U/L   AST 12 0 - 37 U/L   ALT 10 0 - 35 U/L   Total Protein 5.6 (L) 6.0 - 8.3 g/dL   Albumin 3.4 (L) 3.5 - 5.2 g/dL   Calcium 8.6 8.4 - 10.5 mg/dL   GFR 54.46 (L) >60.00 mL/min  T4, free  Result Value Ref Range   Free T4 1.22 0.60 - 1.60 ng/dL  C. difficile GDH and Toxin A/B  Result Value Ref Range   MICRO NUMBER: [id number]    SPECIMEN QUALITY: Adequate    Source STOOL    STATUS: FINAL    GDH ANTIGEN Detected    TOXIN A AND B Detected    COMMENT (A)     Toxigenic C. difficile detected For additional information, please refer to http://education.[facility].com/faq/FAQ136 (This link is being provided for informational/educational purposes only.)   Assessment & Plan:   Problem List Items Addressed This Visit    Prediabetes    Encouraged continue avoiding added sugars in the diet.      Osteoporosis    Marked osteoporosis with T score -4.2 (forearm) at last DEXA. Continues vit D 1000 IU daily.  Has not tolerated oral bisphosphonates in the past (palpitations, joint pains). Avoids calcium due to worsened constipation while on it.    Discussed options, recommended prolia - she and daughter will look into this medication and let us know if desire to try.       Osteoarthritis    She is taking osteo bi flex and vitamin D.       Irritable bowel syndrome with constipation   Hypothyroidism    Chronic, stable on current regimen - continue levothyroxine 135mcg daily.       HYPERTENSION, BENIGN    Chronic, stable. Continue current regimen.       HLD (hyperlipidemia)    Chronic, stable off statin. The ASCVD Risk score Mikey Bussing DC Jr., et al., 2013) failed to calculate for the following reasons:   The 2013  ASCVD risk score is only valid for ages 25 to 64       Heart murmur    Harsh sounding. Presumed aortic sclerosis. 2D echo 2013 reassuring. Continue to monitor.       Health maintenance examination - Primary    Preventative protocols reviewed and updated unless pt declined. Discussed healthy diet and lifestyle.       GERD (gastroesophageal reflux disease)    Continues omeprazole 20mg  daily.       CKD (chronic kidney disease) stage 3, GFR 30-59 ml/min (HCC)    Chronic, stable.      Chronic constipation   Chronic abdominal pain   C. difficile diarrhea    Per daughter this is 3rd episode of C diff diarrheal illness. Currently on vancomycin while GI tries to get dificid abx approved for treatment. Notes improvement. They will f/u with GI for further recommendations.       Anxiety associated with depression    Stable period on sertraline and klonopin.           Meds ordered this encounter  Medications   hydrocortisone (ANUSOL-HC) 2.5 % rectal cream    Sig: Place 1 application rectally 2 (two) times daily.    Dispense:  30 g    Refill:  0   No orders of the defined types were placed in this encounter.   Follow up plan: Return in about 4 months (around 02/22/2019) for follow up visit.  [name], MD

## 2018-10-23 NOTE — Patient Instructions (Addendum)
Call to schedule mammogram  Consider prolia (denosumab) for osteoporosis. This is a shot every 6 months.  If interested, check with pharmacy about new 2 shot shingles series (shingrix).  Use anusol HC for hemorrhoid. Try warm sitz baths twice a day as well.  Return in 4 months for follow up visit.   Health Maintenance After Age 83 After age 72, you are at a higher risk for certain long-term diseases and infections as well as injuries from falls. Falls are a major cause of broken bones and head injuries in people who are older than age 28. Getting regular preventive care can help to keep you healthy and well. Preventive care includes getting regular testing and making lifestyle changes as recommended by your health care provider. Talk with your health care provider about:  Which screenings and tests you should have. A screening is a test that checks for a disease when you have no symptoms.  A diet and exercise plan that is right for you. What should I know about screenings and tests to prevent falls? Screening and testing are the best ways to find a health problem early. Early diagnosis and treatment give you the best chance of managing medical conditions that are common after age 64. Certain conditions and lifestyle choices may make you more likely to have a fall. Your health care provider may recommend:  Regular vision checks. Poor vision and conditions such as cataracts can make you more likely to have a fall. If you wear glasses, make sure to get your prescription updated if your vision changes.  Medicine review. Work with your health care provider to regularly review all of the medicines you are taking, including over-the-counter medicines. Ask your health care provider about any side effects that may make you more likely to have a fall. Tell your health care provider if any medicines that you take make you feel dizzy or sleepy.  Osteoporosis screening. Osteoporosis is a condition that causes  the bones to get weaker. This can make the bones weak and cause them to break more easily.  Blood pressure screening. Blood pressure changes and medicines to control blood pressure can make you feel dizzy.  Strength and balance checks. Your health care provider may recommend certain tests to check your strength and balance while standing, walking, or changing positions.  Foot health exam. Foot pain and numbness, as well as not wearing proper footwear, can make you more likely to have a fall.  Depression screening. You may be more likely to have a fall if you have a fear of falling, feel emotionally low, or feel unable to do activities that you used to do.  Alcohol use screening. Using too much alcohol can affect your balance and may make you more likely to have a fall. What actions can I take to lower my risk of falls? General instructions  Talk with your health care provider about your risks for falling. Tell your health care provider if: ? You fall. Be sure to tell your health care provider about all falls, even ones that seem minor. ? You feel dizzy, sleepy, or off-balance.  Take over-the-counter and prescription medicines only as told by your health care provider. These include any supplements.  Eat a healthy diet and maintain a healthy weight. A healthy diet includes low-fat dairy products, low-fat (lean) meats, and fiber from whole grains, beans, and lots of fruits and vegetables. Home safety  Remove any tripping hazards, such as rugs, cords, and clutter.  Install safety  equipment such as grab bars in bathrooms and safety rails on stairs.  Keep rooms and walkways well-lit. Activity   Follow a regular exercise program to stay fit. This will help you maintain your balance. Ask your health care provider what types of exercise are appropriate for you.  If you need a cane or walker, use it as recommended by your health care provider.  Wear supportive shoes that have nonskid  soles. Lifestyle  Do not drink alcohol if your health care provider tells you not to drink.  If you drink alcohol, limit how much you have: ? 0-1 drink a day for women. ? 0-2 drinks a day for men.  Be aware of how much alcohol is in your drink. In the [place] one drink equals one typical bottle of beer (12 oz), one-half glass of wine (5 oz), or one shot of hard liquor (1 oz).  Do not use any products that contain nicotine or tobacco, such as cigarettes and e-cigarettes. If you need help quitting, ask your health care provider. Summary  Having a healthy lifestyle and getting preventive care can help to protect your health and wellness after age 74.  Screening and testing are the best way to find a health problem early and help you avoid having a fall. Early diagnosis and treatment give you the best chance for managing medical conditions that are more common for people who are older than age 46.  Falls are a major cause of broken bones and head injuries in people who are older than age 61. Take precautions to prevent a fall at home.  Work with your health care provider to learn what changes you can make to improve your health and wellness and to prevent falls. This information is not intended to replace advice given to you by your health care provider. Make sure you discuss any questions you have with your health care provider. Document Released: 02/23/2017 Document Revised: 08/03/2018 Document Reviewed: 02/23/2017 Elsevier Patient Education  2020 [employer].

## 2018-10-23 NOTE — Assessment & Plan Note (Signed)
Chronic, stable. Continue current regimen. 

## 2018-10-23 NOTE — Assessment & Plan Note (Signed)
Chronic, stable 

## 2018-10-24 ENCOUNTER — Encounter: Payer: Self-pay | Admitting: Gastroenterology

## 2018-10-31 ENCOUNTER — Telehealth: Payer: Self-pay | Admitting: Gastroenterology

## 2018-10-31 NOTE — Telephone Encounter (Signed)
Patient's daughter [name] )called & states she called the [employer] about fidaxomicin (DIFICID) 200 MG TABS tablet & they told her they have not received the paperwork for the financial help DR Marius Ditch told them they had been approved for. The cost currently is $600.00. Please send info as soon as possible ,pt is out of the medication.

## 2018-11-01 NOTE — Telephone Encounter (Signed)
Spoke with pt's daughter and she confirmed that she has just completed the patient assistance form and mailed it, I spoke with [facility] and they are unsure of where form was mailed to because there is no address provided to pt to mail, so they will call pt and get information over phone to begin processing request, pt verbalized understanding

## 2018-11-02 ENCOUNTER — Other Ambulatory Visit: Payer: Self-pay

## 2018-11-02 ENCOUNTER — Other Ambulatory Visit: Payer: Self-pay | Admitting: Gastroenterology

## 2018-11-02 ENCOUNTER — Other Ambulatory Visit: Payer: Self-pay | Admitting: Family Medicine

## 2018-11-02 ENCOUNTER — Encounter: Payer: Self-pay | Admitting: Gastroenterology

## 2018-11-02 ENCOUNTER — Ambulatory Visit (INDEPENDENT_AMBULATORY_CARE_PROVIDER_SITE_OTHER): Payer: PPO | Admitting: Gastroenterology

## 2018-11-02 VITALS — BP 147/81 | HR 63 | Temp 98.2°F | Resp 16 | Ht 61.5 in | Wt 154.6 lb

## 2018-11-02 DIAGNOSIS — A0471 Enterocolitis due to Clostridium difficile, recurrent: Secondary | ICD-10-CM | POA: Diagnosis not present

## 2018-11-02 DIAGNOSIS — K623 Rectal prolapse: Secondary | ICD-10-CM

## 2018-11-02 NOTE — Telephone Encounter (Signed)
Spoke with daughter and confirmed medication has been re-sent today to [facility]

## 2018-11-02 NOTE — Telephone Encounter (Signed)
Name of Medication: Clonazepam Name of Pharmacy: [facility] Last Fill or Written Date and Quantity: 10/01/18, #30 Last Office Visit and Type: 10/23/18, CPE Pt 2 Next Office Visit and Type: 02/23/19, 4 mo f/u Last Controlled Substance Agreement Date: 10/06/14 Last UDS: 10/06/14

## 2018-11-02 NOTE — Telephone Encounter (Signed)
[name] called back after visit & they went to [facility] on [address] to pick up the medication (antibiotic)for C-Diff.Please called daughter.

## 2018-11-02 NOTE — Progress Notes (Signed)
Brooke Darby, MD [address]  [place]  [place], [place] [address]  Main: [phone]  Fax: [phone]    Gastroenterology Consultation  Referring Provider:     [name], MD Primary Care Physician:  [name], MD Primary Gastroenterologist:  Dr. Cephas Beasley Reason for Consultation: Recurrent C. difficile infection        HPI:   Clothilde Tippetts is a 85 y.o. pleasant Caucasian female referred by Dr. [name], MD  for consultation & management of chronic constipation, abdominal pain and bloating.  She has history of hypothyroidism, physically active, independent of ADLs, still driving.  Patient is accompanied by her daughter today.  Patient reports that " all my life" have been dealing with constipation, abdominal pain and bloating.  She reports having both hard and loose watery bowel movements associated with incontinence, bright red blood per rectum, on wiping, hemorrhoids have been bothering as she spends up to 20-30 minutes on toilet every day.  She also reports diffuse lower abdominal pain associated with significant bloating.  She had an attempted colonoscopy in 2005, could not advance the scope due to severe diverticula.  She had incomplete colonoscopy in 2012, underwent barium enema study which revealed redundant/tortuous pathway and colonic diverticulosis without other lesions.  She had a virtual colonoscopy in 2018 by Dr. Gustavo Lah which was limited due to presence of stool but showed severe diverticulosis.  She also underwent anorectal manometry which was normal, pelvic floor therapy.  Patient has been taking MiraLAX and Metamucil which does not provide much relief.  She has to wear a diaper due to overflow incontinence.  This has limited her going out.  She denies weight loss, but does have intermittent nausea.  She cannot tolerate solids, cabbage due to significant bloating.  TSH normal, no evidence of anemia She takes Vicodin as needed  for arm pain but not daily  Follow-up visit 07/21/2018 Pt had initial episode of C Diff diarrhea after she was started on cipro and flagyl for possible bacterial overgrowth given her symptoms of significant abdominal bloating. I originally prescribed rifaximin but was not covered by her insurance, therefore switched to cipro and flagyl. She developed fever, n/v/diarrhea and abdominal cramps. Stool studies on 2/06/03/18 returned positive for C Diff antigen and toxin. I started her on 10days course of oral vancomycin which she finished but her diarrhea did not improve. Repeat C Diff returned positive on 07/13/18. I started on dificid which she started 1week ago. Her daughter told me today that she paid full amount $1600 as it was not covered by her insurance. She did not contact my office regarding this. She said they applied for a grant with her insurance. However, her diarrhea currently resolved. She finished a course of azithromycin for bronchitis. Recovering from it. She is currently having 3-4 small mushy BMs. She is also taking colace and benefiber daily. She denies f/c/n/v/abdomina pain, rectal bleeding. Has tingling from hemorrhoids. CBC, CMP normal.  Follow-up visit 08/15/2018: Repeat C. difficile came back positive.  Therefore, she was treated with fidaxomicin twice daily for 14 days followed by long taper, currently she is on Dificid 1 pill on alternate days. Her diarrhea resolved.  Currently, she feels constipated for which she is taking MiraLAX daily along with Benefiber once a day.  She reports having pea-sized bowel movements for 2 to 3 days, and she feels severely constipated, bloated and she increases Benefiber to 3 times that results in more bowel movements incomplete emptying.  She feels she is back to her baseline bowel pattern, struggling with getting her bowels regular   Follow-up visit 11/03/2018 Patient developed second recurrence of C. difficile.  Stool for C. difficile was negative  originally after the first recurrence.  She had recurrence of diarrhea, the stool studies to rule out C. difficile returned positive.  I started her on oral vancomycin 4 times a day and her diarrhea is significantly improved, currently she is having 3 semi-formed stools per day.  She finished 2 weeks course of oral vancomycin and waiting for refill to start prolonged taper.  Today, she also expressed concern about significant time that she has to spent in cleaning after each bowel movement due to protrusion of hemorrhoid/rectum.  She has to sit on a chair or bench for 15 to 20 minutes in order to reduce the prolapsed tissue.  She denies any rectal bleeding.  Her weight is stable.  She does have abdominal bloating.  NSAIDs: None  Antiplts/Anticoagulants/Anti thrombotics: None  GI Procedures: Flexible sigmoidoscopy, incomplete colonoscopy CT colonography  Past Medical History:  Diagnosis Date  . Anxiety   . CAP (community acquired pneumonia) 12/22/2017  . Carotid stenosis 10/18/2014   R 1-39%, L 40-59%, rpt 1 yr (09/2014)   . CKD (chronic kidney disease) stage 3, GFR 30-59 ml/min (HCC) 08/02/2014  . Degenerative disc disease    LS  . Depression    nervous breakdonw in 1964-out of work for a year  . Diverticulosis    severe by colonoscopy  . Family history of adverse reaction to anesthesia    n/v  . GERD (gastroesophageal reflux disease)   . Glaucoma   . Heart murmur 5/13   mitral regurge - on echo   . History of hiatal hernia   . History of shingles   . HTN (hypertension)   . Hyperlipidemia   . Hypertension   . Hypothyroidism   . IBS 11/02/2006  . Intestinal bacterial overgrowth    In small colon  . Maxillary fracture ([place]) 04/08/2012  . Osteoarthritis 2016   [facility])  . Osteoporosis    dexa 2011  . Pseudogout 2016   shoulders ([place])    Past Surgical History:  Procedure Laterality Date  . barium enema  2012   severe diverticulosis, redundant colon  . Bowel obstruction   1999   no surgery in hosp x 3 days  . COLONOSCOPY  1. 1999  2. 11/04   1. Not finished  2. Slight hemorrhage rectosigmoid area, severe sig diverticulosis  . Dexa  1. 0347-4259   2. 9/04  3. 3/08    1. OP  2. OP, borderline, spine -2.44T  3. decreased BMD-OP  . DEXA  2011   OP in spine  . DG KNEE 1-2 VIEWS BILAT     LS x-ray with degenerative disc and facet change  . ESOPHAGOGASTRODUODENOSCOPY     Negative  . Hemorrhoid procedure  4/08  . JOINT REPLACEMENT    . TONSILLECTOMY    . TOTAL SHOULDER ARTHROPLASTY Left 11/27/2015   Corky Mull, MD  . TOTAL SHOULDER REVISION Left 03/16/2016   Procedure: TOTAL SHOULDER REVISION;  Surgeon: Corky Mull, MD;  Location: [facility] ORS;  Service: Orthopedics;  Laterality: Left;  . US ECHOCARDIOGRAPHY  08/6385   Normal systolic fxn with EF 56-43%.  Focal basal septal hypertrophy.  Mild diastolic dysfunction.  Mild MR.    Current Outpatient Medications:  .  acetaminophen (TYLENOL) 500 MG tablet, Take 500-1,000 mg by  mouth at bedtime. Depends on pain, Disp: , Rfl:  .  amLODipine (NORVASC) 10 MG tablet, Take 1 tablet (10 mg total) by mouth daily., Disp: 90 tablet, Rfl: 3 .  bimatoprost (LUMIGAN) 0.01 % SOLN, Place 1 drop into both eyes at bedtime., Disp: , Rfl:  .  Bioflavonoid Products (VITAMIN C) CHEW, Chew 1 tablet by mouth daily., Disp: , Rfl:  .  cholecalciferol (VITAMIN D) 1000 units tablet, Take 1,000 Units by mouth daily., Disp: , Rfl:  .  fish oil-omega-3 fatty acids 1000 MG capsule, Take 1 g by mouth daily. , Disp: , Rfl:  .  furosemide (LASIX) 20 MG tablet, TAKE 1 TABLET BY MOUTH ONCE DAILY AS NEEDED FOR EDEMA, Disp: 90 tablet, Rfl: 0 .  hydrocortisone (ANUSOL-HC) 2.5 % rectal cream, Place 1 application rectally 2 (two) times daily., Disp: 30 g, Rfl: 0 .  levothyroxine (SYNTHROID, LEVOTHROID) 100 MCG tablet, Take 1 tablet (100 mcg total) by mouth daily., Disp: 30 tablet, Rfl: 11 .  methocarbamol (ROBAXIN) 500 MG tablet, Take 1 tablet (500 mg total)  by mouth every 6 (six) hours as needed for muscle spasms., Disp: 20 tablet, Rfl: 0 .  Misc Natural Products (OSTEO BI-FLEX TRIPLE STRENGTH PO), Take 1 tablet by mouth daily. With Vit D, Disp: , Rfl:  .  omeprazole (PRILOSEC) 20 MG capsule, Take 1 capsule (20 mg total) by mouth daily., Disp: 90 capsule, Rfl: 3 .  potassium chloride (K-DUR) 10 MEQ tablet, Take 1 tablet (10 mEq total) by mouth daily as needed (with lasix)., Disp: 30 tablet, Rfl: 3 .  sertraline (ZOLOFT) 100 MG tablet, Take 1 tablet (100 mg total) by mouth daily., Disp: 90 tablet, Rfl: 1 .  TURMERIC PO, Take 1 tablet by mouth daily., Disp: , Rfl:  .  VITAMIN E PO, Take 1 tablet by mouth daily., Disp: , Rfl:  .  VOLTAREN 1 % GEL, APPLY ONE APPLICATION TOPICALLY 3 TIMES DAILY (Patient taking differently: APPLY ONE APPLICATION TOPICALLY daily as needed for shoulder pain), Disp: 100 g, Rfl: 3 .  albuterol (PROVENTIL HFA;VENTOLIN HFA) 108 (90 Base) MCG/ACT inhaler, Inhale 2 puffs into the lungs every 6 (six) hours as needed for wheezing or shortness of breath. (Patient not taking: Reported on 11/02/2018), Disp: 1 Inhaler, Rfl: 0 .  clonazePAM (KLONOPIN) 0.5 MG tablet, TAKE 1 TABLET BY MOUTH AT BEDTIME AS NEEDED FOR ANXIETY, Disp: 30 tablet, Rfl: 3 .  fidaxomicin (DIFICID) 200 MG TABS tablet, Take 1 tablet (200 mg total) by mouth 2 (two) times daily with a meal for 30 days. (Patient not taking: Reported on 10/23/2018), Disp: 60 tablet, Rfl: 0 .  HYDROcodone-acetaminophen (NORCO/VICODIN) 5-325 MG tablet, Take 1-2 tablets by mouth every 6 (six) hours as needed for moderate pain or severe pain. (Patient not taking: Reported on 11/02/2018), Disp: 20 tablet, Rfl: 0 .  vancomycin (VANCOCIN) 125 MG capsule, TAKE 1 CAPSULE BY MOUTH 4 TIMES DAILY FOR 14 DAYS, Disp: 56 capsule, Rfl: 0   Family History  Problem Relation Age of Onset  . Diabetes Brother        post-op  . Coronary artery disease Brother   . Diabetes Paternal Grandfather   . Breast cancer  Cousin      Social History   Tobacco Use  . Smoking status: Never Smoker  . Smokeless tobacco: Never Used  Substance Use Topics  . Alcohol use: No  . Drug use: No    Allergies as of 11/02/2018 - Review Complete 11/02/2018  Allergen Reaction Noted  . Prednisone Other (See Comments) 11/12/2015  . Ace inhibitors Other (See Comments) 04/02/2008  . Alendronate sodium  09/14/2006  . Losartan Other (See Comments) 09/26/2014  . Other Other (See Comments) 11/12/2015  . Paroxetine hcl Other (See Comments) 06/15/2011  . Risedronate sodium  03/13/2007  . Silenor [doxepin hcl] Other (See Comments) 04/02/2013  . Simvastatin  10/03/2009  . Sulfonamide derivatives  06/08/2006  . Tramadol Other (See Comments) 11/12/2015  . Influenza vaccines Rash and Other (See Comments) 05/24/2014    Review of Systems:    All systems reviewed and negative except where noted in HPI.   Physical Exam:  BP (!) 147/81 (BP Location: Left Arm, Patient Position: Sitting, Cuff Size: Large)   Pulse 63   Temp 98.2 F (36.8 C)   Resp 16   Ht 5' 1.5" (1.562 m)   Wt 154 lb 9.6 oz (70.1 kg)   BMI 28.74 kg/m  No LMP recorded. Patient is postmenopausal.  General:   Alert,  Well-developed, well-nourished, pleasant and cooperative in NAD Head:  Normocephalic and atraumatic. Eyes:  Sclera clear, no icterus.   Conjunctiva pink. Ears:  Normal auditory acuity. Nose:  No deformity, discharge, or lesions. Mouth:  No deformity or lesions,oropharynx pink & moist. Neck:  Supple; no masses or thyromegaly. Lungs:  Respirations even and unlabored.  Clear throughout to auscultation.   No wheezes, crackles, or rhonchi. No acute distress. Heart:  Regular rate and rhythm; no murmurs, clicks, rubs, or gallops. Abdomen:  Normal bowel sounds. Soft, non-tender and non-distended without masses, hepatosplenomegaly or hernias noted.  No guarding or rebound tenderness.   Rectal: Normal perianal exam.  When I asked patient to bear down,  it resulted in spontaneous prolapse of the rectum Msk:  Symmetrical without gross deformities. Good, equal movement & strength bilaterally. Pulses:  Normal pulses noted. Extremities:  No clubbing or edema.  No cyanosis. Neurologic:  Alert and oriented x3;  grossly normal neurologically. Skin:  Intact without significant lesions or rashes. No jaundice. Psych:  Alert and cooperative. Normal mood and affect.  Imaging Studies: Reviewed  Assessment and Plan:   Merri Dimaano is a 85 y.o. female with history of severe diverticulosis, depression, anxiety, insomnia, severe constipation, abdominal bloating followed by antibiotic associated C. difficile diarrhea in 05/2018 s/p 10days of oral vancomycin, 1st recurrence in 06/2018, treated with 2 weeks of Dificid with prolonged taper.  Second recurrence in 09/2018, currently on oral vancomycin  Recurrent C. difficile infection She currently has second recurrence, diarrhea controlled on oral vanc 4times daily, having 3 semiformed Bms daily Continue oral vancomycin, 3 times daily for 2 weeks then twice daily. Discussed with her about fecal transplant via colonoscopy and she agreed Will perform pre-transplant labs I have discussed alternative options, risks & benefits,  which include, but are not limited to, bleeding, infection, perforation,respiratory complication & drug reaction.  The patient agrees with this plan & written consent will be obtained.    Rectal prolapse We will refer her to colorectal surgery after treatment of C. difficile   Follow up in 2 months   Brooke Darby, MD

## 2018-11-03 ENCOUNTER — Encounter: Payer: Self-pay | Admitting: Gastroenterology

## 2018-11-03 NOTE — Telephone Encounter (Signed)
Eprescribed.

## 2018-11-09 ENCOUNTER — Ambulatory Visit: Payer: PPO | Admitting: Gastroenterology

## 2018-11-09 ENCOUNTER — Telehealth: Payer: Self-pay | Admitting: Gastroenterology

## 2018-11-09 NOTE — Telephone Encounter (Signed)
When Brooke Beasley from [facility] called patient to make appt there she though it was for hemorrhoids & not for rectal prolapse. She just thought we needed to call the patient & explain to her more in detail.

## 2018-11-14 ENCOUNTER — Other Ambulatory Visit: Payer: Self-pay | Admitting: Gastroenterology

## 2018-11-14 DIAGNOSIS — A0471 Enterocolitis due to Clostridium difficile, recurrent: Secondary | ICD-10-CM | POA: Diagnosis not present

## 2018-11-15 ENCOUNTER — Other Ambulatory Visit: Payer: Self-pay | Admitting: Gastroenterology

## 2018-11-15 ENCOUNTER — Encounter: Payer: Self-pay | Admitting: Gastroenterology

## 2018-11-15 DIAGNOSIS — A0471 Enterocolitis due to Clostridium difficile, recurrent: Secondary | ICD-10-CM

## 2018-11-15 LAB — HEPATITIS A ANTIBODY, TOTAL: hep A Total Ab: NEGATIVE

## 2018-11-15 LAB — RPR: RPR Ser Ql: NONREACTIVE

## 2018-11-15 LAB — HIV ANTIBODY (ROUTINE TESTING W REFLEX): HIV Screen 4th Generation wRfx: NONREACTIVE

## 2018-11-15 LAB — HEPATITIS B SURFACE ANTIGEN: Hepatitis B Surface Ag: NEGATIVE

## 2018-11-15 LAB — HEPATITIS C ANTIBODY: Hep C Virus Ab: <0.1 s/co ratio (ref 0.0–0.9)

## 2018-11-15 NOTE — Telephone Encounter (Signed)
Pt daughter [name] left vm returning Temeka's call regarding pt rx. Please call pt clarify what rx pt needs to start taking

## 2018-11-17 LAB — OVA AND PARASITE EXAMINATION

## 2018-11-18 ENCOUNTER — Other Ambulatory Visit: Payer: Self-pay | Admitting: Family Medicine

## 2018-11-18 LAB — GI PROFILE, STOOL, PCR

## 2018-11-21 ENCOUNTER — Telehealth: Payer: Self-pay

## 2018-11-21 DIAGNOSIS — K623 Rectal prolapse: Secondary | ICD-10-CM | POA: Diagnosis not present

## 2018-11-21 NOTE — Telephone Encounter (Signed)
Let's cancel her procedure at this time. I have been doing fecal transplant for my own patients. So, please schedule them with me only unless I'm not available. Let Trish in Endo know  Thanks [employer]

## 2018-11-21 NOTE — Telephone Encounter (Signed)
Patient's daughter has been informed her mother's fecal transplant has been canceled.  Thanks [employer]

## 2018-11-21 NOTE — Telephone Encounter (Signed)
Trish from Endoscopy has informed me that the fecal transplant is not available due to [place].  Pt is scheduled for 08/06 with Dr. Vicente Males.  Thanks [employer]

## 2018-11-24 NOTE — Telephone Encounter (Signed)
Procedure has been cancelled and pt has been notified

## 2018-11-30 ENCOUNTER — Ambulatory Visit: Admit: 2018-11-30 | Payer: PPO | Admitting: Gastroenterology

## 2018-11-30 ENCOUNTER — Other Ambulatory Visit: Payer: Self-pay | Admitting: Family Medicine

## 2018-11-30 SURGERY — FECAL MICROBIOTA TRANSFER
Anesthesia: General

## 2018-12-18 ENCOUNTER — Other Ambulatory Visit: Payer: Self-pay | Admitting: Gastroenterology

## 2018-12-18 DIAGNOSIS — A0471 Enterocolitis due to Clostridium difficile, recurrent: Secondary | ICD-10-CM

## 2018-12-21 NOTE — Telephone Encounter (Signed)
Daughter called stating patient is out of medication vancomycin (VANCOCIN) 125 MG capsule for c-diff. SHe called [facility] they called for the refill. Please call daughter.

## 2019-01-03 ENCOUNTER — Other Ambulatory Visit: Payer: Self-pay | Admitting: Family Medicine

## 2019-01-03 ENCOUNTER — Ambulatory Visit: Payer: PPO | Admitting: Gastroenterology

## 2019-01-04 ENCOUNTER — Encounter: Payer: Self-pay | Admitting: Gastroenterology

## 2019-01-04 ENCOUNTER — Ambulatory Visit (INDEPENDENT_AMBULATORY_CARE_PROVIDER_SITE_OTHER): Payer: PPO | Admitting: Gastroenterology

## 2019-01-04 ENCOUNTER — Other Ambulatory Visit: Payer: Self-pay

## 2019-01-04 VITALS — BP 151/70 | HR 68 | Temp 97.8°F | Resp 16 | Ht 61.5 in | Wt 153.0 lb

## 2019-01-04 DIAGNOSIS — A0471 Enterocolitis due to Clostridium difficile, recurrent: Secondary | ICD-10-CM

## 2019-01-04 NOTE — Progress Notes (Signed)
Cephas Darby, MD [address]  [place]  [place], [place] [address]  Main: [phone]  Fax: [phone]    Gastroenterology Consultation  Referring Provider:     [name], MD Primary Care Physician:  [name], MD Primary Gastroenterologist:  Dr. Cephas Darby Reason for Consultation: Recurrent C. difficile infection        HPI:   Brooke Beasley is a 86 y.o. pleasant Caucasian female referred by Dr. [name], MD  for consultation & management of chronic constipation, abdominal pain and bloating.  She has history of hypothyroidism, physically active, independent of ADLs, still driving.  Patient is accompanied by her daughter today.  Patient reports that " all my life" have been dealing with constipation, abdominal pain and bloating.  She reports having both hard and loose watery bowel movements associated with incontinence, bright red blood per rectum, on wiping, hemorrhoids have been bothering as she spends up to 20-30 minutes on toilet every day.  She also reports diffuse lower abdominal pain associated with significant bloating.  She had an attempted colonoscopy in 2005, could not advance the scope due to severe diverticula.  She had incomplete colonoscopy in 2012, underwent barium enema study which revealed redundant/tortuous pathway and colonic diverticulosis without other lesions.  She had a virtual colonoscopy in 2018 by Dr. Gustavo Lah which was limited due to presence of stool but showed severe diverticulosis.  She also underwent anorectal manometry which was normal, pelvic floor therapy.  Patient has been taking MiraLAX and Metamucil which does not provide much relief.  She has to wear a diaper due to overflow incontinence.  This has limited her going out.  She denies weight loss, but does have intermittent nausea.  She cannot tolerate solids, cabbage due to significant bloating.  TSH normal, no evidence of anemia She takes Vicodin as needed  for arm pain but not daily  Follow-up visit 07/21/2018 Pt had initial episode of C Diff diarrhea after she was started on cipro and flagyl for possible bacterial overgrowth given her symptoms of significant abdominal bloating. I originally prescribed rifaximin but was not covered by her insurance, therefore switched to cipro and flagyl. She developed fever, n/v/diarrhea and abdominal cramps. Stool studies on 2/06/03/18 returned positive for C Diff antigen and toxin. I started her on 10days course of oral vancomycin which she finished but her diarrhea did not improve. Repeat C Diff returned positive on 07/13/18. I started on dificid which she started 1week ago. Her daughter told me today that she paid full amount $1600 as it was not covered by her insurance. She did not contact my office regarding this. She said they applied for a grant with her insurance. However, her diarrhea currently resolved. She finished a course of azithromycin for bronchitis. Recovering from it. She is currently having 3-4 small mushy BMs. She is also taking colace and benefiber daily. She denies f/c/n/v/abdomina pain, rectal bleeding. Has tingling from hemorrhoids. CBC, CMP normal.  Follow-up visit 08/15/2018: Repeat C. difficile came back positive.  Therefore, she was treated with fidaxomicin twice daily for 14 days followed by long taper, currently she is on Dificid 1 pill on alternate days. Her diarrhea resolved.  Currently, she feels constipated for which she is taking MiraLAX daily along with Benefiber once a day.  She reports having pea-sized bowel movements for 2 to 3 days, and she feels severely constipated, bloated and she increases Benefiber to 3 times that results in more bowel movements incomplete emptying.  She feels she is back to her baseline bowel pattern, struggling with getting her bowels regular  Follow-up visit 11/03/2018 Patient developed second recurrence of C. difficile.  Stool for C. difficile was negative  originally after the first recurrence.  She had recurrence of diarrhea, the stool studies to rule out C. difficile returned positive.  I started her on oral vancomycin 4 times a day and her diarrhea is significantly improved, currently she is having 3 semi-formed stools per day.  She finished 2 weeks course of oral vancomycin and waiting for refill to start prolonged taper.  Today, she also expressed concern about significant time that she has to spent in cleaning after each bowel movement due to protrusion of hemorrhoid/rectum.  She has to sit on a chair or bench for 15 to 20 minutes in order to reduce the prolapsed tissue.  She denies any rectal bleeding.  Her weight is stable.  She does have abdominal bloating.  Follow-up visit 01/04/2019 She reports her diarrhea is overall improved but continues to have 3-4 loose bowel movements few days a week.  Her hemorrhoid/partial rectal prolapse is her most concerning problem.  She was seen by [facility] surgery, Dr. Leighton Ruff who preferred her to be treated for C. difficile before undergoing surgery. I tried to do FMT but due to COVID-19 pandemic, open biome is on hold.  Patient is currently taking oral vancomycin 125 mg twice daily as maintenance, currently having 3-5 loose bowel movements daily.  She reports feeling tired due to frequent bowel movements.  She has been able to keep up with electrolytes, avoid dehydration.  Her weight has been stable.  She is desperately waiting for fecal transplant.  Patient is accompanied by her daughter today.   NSAIDs: None  Antiplts/Anticoagulants/Anti thrombotics: None  GI Procedures: Flexible sigmoidoscopy, incomplete colonoscopy CT colonography  Past Medical History:  Diagnosis Date  . Anxiety   . CAP (community acquired pneumonia) 12/22/2017  . Carotid stenosis 10/18/2014   R 1-39%, L 40-59%, rpt 1 yr (09/2014)   . CKD (chronic kidney disease) stage 3, GFR 30-59 ml/min (HCC) 08/02/2014  . Degenerative disc  disease    LS  . Depression    nervous breakdonw in 1964-out of work for a year  . Diverticulosis    severe by colonoscopy  . Family history of adverse reaction to anesthesia    n/v  . GERD (gastroesophageal reflux disease)   . Glaucoma   . Heart murmur 5/13   mitral regurge - on echo   . History of hiatal hernia   . History of shingles   . HTN (hypertension)   . Hyperlipidemia   . Hypertension   . Hypothyroidism   . IBS 11/02/2006  . Intestinal bacterial overgrowth    In small colon  . Maxillary fracture ([place]) 04/08/2012  . Osteoarthritis 2016   Jefm Bryant)  . Osteoporosis    dexa 2011  . Pseudogout 2016   shoulders ([place])    Past Surgical History:  Procedure Laterality Date  . barium enema  2012   severe diverticulosis, redundant colon  . Bowel obstruction  1999   no surgery in hosp x 3 days  . COLONOSCOPY  1. 1999  2. 11/04   1. Not finished  2. Slight hemorrhage rectosigmoid area, severe sig diverticulosis  . Dexa  1. [id number]   2. 9/04  3. 3/08    1. OP  2. OP, borderline, spine -2.44T  3. decreased BMD-OP  . DEXA  2011  OP in spine  . DG KNEE 1-2 VIEWS BILAT     LS x-ray with degenerative disc and facet change  . ESOPHAGOGASTRODUODENOSCOPY     Negative  . Hemorrhoid procedure  4/08  . JOINT REPLACEMENT    . TONSILLECTOMY    . TOTAL SHOULDER ARTHROPLASTY Left 11/27/2015   Corky Mull, MD  . TOTAL SHOULDER REVISION Left 03/16/2016   Procedure: TOTAL SHOULDER REVISION;  Surgeon: Corky Mull, MD;  Location: [facility] ORS;  Service: Orthopedics;  Laterality: Left;  . US ECHOCARDIOGRAPHY  [id number]   Normal systolic fxn with EF 0000000.  Focal basal septal hypertrophy.  Mild diastolic dysfunction.  Mild MR.    Current Outpatient Medications:  .  acetaminophen (TYLENOL) 500 MG tablet, Take 500-1,000 mg by mouth at bedtime. Depends on pain, Disp: , Rfl:  .  amLODipine (NORVASC) 10 MG tablet, Take 1 tablet by mouth once daily, Disp: 90 tablet, Rfl: 3 .  bimatoprost  (LUMIGAN) 0.01 % SOLN, Place 1 drop into both eyes at bedtime., Disp: , Rfl:  .  Bioflavonoid Products (VITAMIN C) CHEW, Chew 1 tablet by mouth daily., Disp: , Rfl:  .  cholecalciferol (VITAMIN D) 1000 units tablet, Take 1,000 Units by mouth daily., Disp: , Rfl:  .  clonazePAM (KLONOPIN) 0.5 MG tablet, TAKE 1 TABLET BY MOUTH AT BEDTIME AS NEEDED FOR ANXIETY, Disp: 30 tablet, Rfl: 3 .  fish oil-omega-3 fatty acids 1000 MG capsule, Take 1 g by mouth daily. , Disp: , Rfl:  .  furosemide (LASIX) 20 MG tablet, TAKE 1 TABLET BY MOUTH ONCE DAILY AS NEEDED FOR EDEMA, Disp: 90 tablet, Rfl: 0 .  HYDROcodone-acetaminophen (NORCO/VICODIN) 5-325 MG tablet, Take 1-2 tablets by mouth every 6 (six) hours as needed for moderate pain or severe pain., Disp: 20 tablet, Rfl: 0 .  hydrocortisone (ANUSOL-HC) 2.5 % rectal cream, Place 1 application rectally 2 (two) times daily., Disp: 30 g, Rfl: 0 .  levothyroxine (SYNTHROID) 100 MCG tablet, Take 1 tablet by mouth once daily, Disp: 90 tablet, Rfl: 1 .  methocarbamol (ROBAXIN) 500 MG tablet, Take 1 tablet (500 mg total) by mouth every 6 (six) hours as needed for muscle spasms., Disp: 20 tablet, Rfl: 0 .  Misc Natural Products (OSTEO BI-FLEX TRIPLE STRENGTH PO), Take 1 tablet by mouth daily. With Vit D, Disp: , Rfl:  .  omeprazole (PRILOSEC) 20 MG capsule, Take 1 capsule (20 mg total) by mouth daily., Disp: 90 capsule, Rfl: 3 .  potassium chloride (K-DUR) 10 MEQ tablet, TAKE 1 TABLET BY MOUTH ONCE DAILY AS NEEDED (WITH  LASIX  (FUROSEMIDE)), Disp: 90 tablet, Rfl: 0 .  sertraline (ZOLOFT) 100 MG tablet, Take 1 tablet (100 mg total) by mouth daily., Disp: 90 tablet, Rfl: 1 .  TURMERIC PO, Take 1 tablet by mouth daily., Disp: , Rfl:  .  vancomycin (VANCOCIN) 125 MG capsule, TAKE 1 CAPSULE BY MOUTH 4 TIMES DAILY FOR 14 DAYS, Disp: 56 capsule, Rfl: 0 .  VITAMIN E PO, Take 1 tablet by mouth daily., Disp: , Rfl:  .  VOLTAREN 1 % GEL, APPLY ONE APPLICATION TOPICALLY 3 TIMES DAILY  (Patient taking differently: APPLY ONE APPLICATION TOPICALLY daily as needed for shoulder pain), Disp: 100 g, Rfl: 3 .  albuterol (PROVENTIL HFA;VENTOLIN HFA) 108 (90 Base) MCG/ACT inhaler, Inhale 2 puffs into the lungs every 6 (six) hours as needed for wheezing or shortness of breath. (Patient not taking: Reported on 11/02/2018), Disp: 1 Inhaler, Rfl: 0   Family  History  Problem Relation Age of Onset  . Diabetes Brother        post-op  . Coronary artery disease Brother   . Diabetes Paternal Grandfather   . Breast cancer Cousin      Social History   Tobacco Use  . Smoking status: Never Smoker  . Smokeless tobacco: Never Used  Substance Use Topics  . Alcohol use: No  . Drug use: No    Allergies as of 01/04/2019 - Review Complete 01/04/2019  Allergen Reaction Noted  . Prednisone Other (See Comments) 11/12/2015  . Ace inhibitors Other (See Comments) 04/02/2008  . Alendronate sodium  09/14/2006  . Losartan Other (See Comments) 09/26/2014  . Other Other (See Comments) 11/12/2015  . Paroxetine hcl Other (See Comments) 06/15/2011  . Risedronate sodium  03/13/2007  . Silenor [doxepin hcl] Other (See Comments) 04/02/2013  . Simvastatin  10/03/2009  . Sulfonamide derivatives  06/08/2006  . Tramadol Other (See Comments) 11/12/2015  . Influenza vaccines Rash and Other (See Comments) 05/24/2014    Review of Systems:    All systems reviewed and negative except where noted in HPI.   Physical Exam:  BP (!) 151/70 (BP Location: Left Arm, Patient Position: Sitting, Cuff Size: Large)   Pulse 68   Temp 97.8 F (36.6 C)   Resp 16   Ht 5' 1.5" (1.562 m)   Wt 153 lb (69.4 kg)   BMI 28.44 kg/m  No LMP recorded. Patient is postmenopausal.  General:   Alert,  Well-developed, well-nourished, pleasant and cooperative in NAD Head:  Normocephalic and atraumatic. Eyes:  Sclera clear, no icterus.   Conjunctiva pink. Ears:  Normal auditory acuity. Nose:  No deformity, discharge, or lesions.  Mouth:  No deformity or lesions,oropharynx pink & moist. Neck:  Supple; no masses or thyromegaly. Lungs:  Respirations even and unlabored.  Clear throughout to auscultation.   No wheezes, crackles, or rhonchi. No acute distress. Heart:  Regular rate and rhythm; no murmurs, clicks, rubs, or gallops. Abdomen:  Normal bowel sounds. Soft, non-tender and non-distended without masses, hepatosplenomegaly or hernias noted.  No guarding or rebound tenderness.   Rectal: Not performed Msk:  Symmetrical without gross deformities. Good, equal movement & strength bilaterally. Pulses:  Normal pulses noted. Extremities:  No clubbing or edema.  No cyanosis. Neurologic:  Alert and oriented x3;  grossly normal neurologically. Skin:  Intact without significant lesions or rashes. No jaundice. Psych:  Alert and cooperative. Normal mood and affect.  Imaging Studies: Reviewed  Assessment and Plan:   Brooke Beasley is a 85 y.o. female with history of severe diverticulosis, depression, anxiety, insomnia, severe constipation, abdominal bloating followed by antibiotic associated C. difficile diarrhea in 05/2018 s/p 10days of oral vancomycin, 1st recurrence in 06/2018, treated with 2 weeks of Dificid with prolonged taper.  Second recurrence in 09/2018, currently maintained on long-term oral vancomycin.  C. difficile toxin returned positive on 11/14/2018 while on oral vancomycin  Recurrent C. difficile infection status post second recurrence Diarrhea fairly controlled on oral vancomycin Will start cholestyramine 1 to 2 packets daily as she continues to have frequent and soft bowel movements Discussed with gastroenterologist at [facility], Dr. Grafton Folk.  FMT is on hold at [facility] as well.  Unfortunately, she would not qualify for FMT clinical trial given the duration of her C. difficile infection  Rectal prolapse She will follow up with colorectal surgery after treatment of C. difficile   Follow up in 3 months    Emagene Merfeld R Tamre Cass,  MD

## 2019-01-15 ENCOUNTER — Other Ambulatory Visit: Payer: Self-pay | Admitting: Gastroenterology

## 2019-01-15 DIAGNOSIS — A0471 Enterocolitis due to Clostridium difficile, recurrent: Secondary | ICD-10-CM

## 2019-01-29 ENCOUNTER — Encounter: Payer: Self-pay | Admitting: Family Medicine

## 2019-02-21 ENCOUNTER — Ambulatory Visit (INDEPENDENT_AMBULATORY_CARE_PROVIDER_SITE_OTHER): Payer: PPO | Admitting: Family Medicine

## 2019-02-21 ENCOUNTER — Encounter: Payer: Self-pay | Admitting: Family Medicine

## 2019-02-21 ENCOUNTER — Telehealth: Payer: Self-pay | Admitting: Family Medicine

## 2019-02-21 ENCOUNTER — Other Ambulatory Visit: Payer: Self-pay

## 2019-02-21 VITALS — BP 122/78 | HR 66 | Temp 97.8°F | Ht 61.5 in | Wt 152.1 lb

## 2019-02-21 DIAGNOSIS — M810 Age-related osteoporosis without current pathological fracture: Secondary | ICD-10-CM | POA: Diagnosis not present

## 2019-02-21 DIAGNOSIS — M1120 Other chondrocalcinosis, unspecified site: Secondary | ICD-10-CM | POA: Diagnosis not present

## 2019-02-21 DIAGNOSIS — I10 Essential (primary) hypertension: Secondary | ICD-10-CM | POA: Diagnosis not present

## 2019-02-21 DIAGNOSIS — A0472 Enterocolitis due to Clostridium difficile, not specified as recurrent: Secondary | ICD-10-CM | POA: Diagnosis not present

## 2019-02-21 MED ORDER — COLCHICINE 0.6 MG PO TABS: 0.6000 mg | ORAL_TABLET | Freq: Every day | ORAL | 1 refills | Status: DC | PRN

## 2019-02-21 NOTE — Assessment & Plan Note (Signed)
Chronic, stable. Continue current regimen. 

## 2019-02-21 NOTE — Telephone Encounter (Signed)
Can we price out prolia for patient? Thank you.  

## 2019-02-21 NOTE — Assessment & Plan Note (Signed)
Recurrent. Appreciate GI care. Continues vancomycin, awaiting fecal transplant option.

## 2019-02-21 NOTE — Assessment & Plan Note (Addendum)
Severe. She continues daily vitamin D. Intolerant to calcium and oral bisphosphonates.  Will price out prolia for her as she's interested.

## 2019-02-21 NOTE — Patient Instructions (Addendum)
We will price out prolia and call you with update. This is 1 shot every 6 months in office.  Return as needed or in 4-6 months for follow up visit.  If interested, check with pharmacy about new 2 shot shingles series (shingrix).

## 2019-02-21 NOTE — Progress Notes (Signed)
This visit was conducted in person.  BP 122/78 (BP Location: Left Arm, Patient Position: Sitting, Cuff Size: Normal)   Pulse 66   Temp 97.8 F (36.6 C) (Temporal)   Ht 5' 1.5" (1.562 m)   Wt 152 lb 2 oz (69 kg)   SpO2 96%   BMI 28.28 kg/m    CC: 4 mo f/u visit Subjective:    Patient ID: Brooke Beasley, female    DOB: 1930-06-22, 85 y.o.   MRN: [id number]  HPI: Brooke Beasley is a 85 y.o. female presenting on 02/21/2019 for Follow-up (Here for 4 mo f/u. Pt accompanied by daughter, [name]. )   Saw GI last month, note reviewed. Fecal transplant for recurrent C diff infection is on hold due to pandemic. She continues oral vancomycin one a day. Started on cholestyramine and imodium - not currently taking. Planning surgery for rectal prolapse once C diff resolved. Trying to stay well hydrated with water and boost. No fever.   Osteoporosis - intolerant to oral bisphosphonate and calcium. Looking into prolia.   HTN - Compliant with current antihypertensive regimen of amlodipine 10mg  daily and lasix 20mg  QOD with potassium. Does not check blood pressures at home. No low blood pressure readings or symptoms of dizziness/presyncope. Denies vision changes, CP/tightness, SOB. Occasional headaches and ankle swelling.      Relevant past medical, surgical, family and social history reviewed and updated as indicated. Interim medical history since our last visit reviewed. Allergies and medications reviewed and updated. Outpatient Medications Prior to Visit  Medication Sig Dispense Refill  . acetaminophen (TYLENOL) 500 MG tablet Take 500-1,000 mg by mouth at bedtime. Depends on pain    . albuterol (PROVENTIL HFA;VENTOLIN HFA) 108 (90 Base) MCG/ACT inhaler Inhale 2 puffs into the lungs every 6 (six) hours as needed for wheezing or shortness of breath. 1 Inhaler 0  . amLODipine (NORVASC) 10 MG tablet Take 1 tablet by mouth once daily 90 tablet 3  . bimatoprost (LUMIGAN) 0.01 % SOLN  Place 1 drop into both eyes at bedtime.    Marland Kitchen Bioflavonoid Products (VITAMIN C) CHEW Chew 1 tablet by mouth daily.    . cholecalciferol (VITAMIN D) 1000 units tablet Take 1,000 Units by mouth daily.    . clonazePAM (KLONOPIN) 0.5 MG tablet TAKE 1 TABLET BY MOUTH AT BEDTIME AS NEEDED FOR ANXIETY 30 tablet 3  . fish oil-omega-3 fatty acids 1000 MG capsule Take 1 g by mouth daily.     . furosemide (LASIX) 20 MG tablet TAKE 1 TABLET BY MOUTH ONCE DAILY AS NEEDED FOR EDEMA 90 tablet 0  . HYDROcodone-acetaminophen (NORCO/VICODIN) 5-325 MG tablet Take 1-2 tablets by mouth every 6 (six) hours as needed for moderate pain or severe pain. 20 tablet 0  . hydrocortisone (ANUSOL-HC) 2.5 % rectal cream Place 1 application rectally 2 (two) times daily. 30 g 0  . levothyroxine (SYNTHROID) 100 MCG tablet Take 1 tablet by mouth once daily 90 tablet 1  . methocarbamol (ROBAXIN) 500 MG tablet Take 1 tablet (500 mg total) by mouth every 6 (six) hours as needed for muscle spasms. 20 tablet 0  . Misc Natural Products (OSTEO BI-FLEX TRIPLE STRENGTH PO) Take 1 tablet by mouth daily. With Vit D    . omeprazole (PRILOSEC) 20 MG capsule Take 1 capsule (20 mg total) by mouth daily. 90 capsule 3  . potassium chloride (K-DUR) 10 MEQ tablet TAKE 1 TABLET BY MOUTH ONCE DAILY AS NEEDED (WITH  LASIX  (FUROSEMIDE)) 90 tablet  0  . sertraline (ZOLOFT) 100 MG tablet Take 1 tablet (100 mg total) by mouth daily. 90 tablet 1  . TURMERIC PO Take 1 tablet by mouth daily.    . vancomycin (VANCOCIN) 125 MG capsule Take 1 capsule (125 mg total) by mouth daily. 60 capsule 1  . VITAMIN E PO Take 1 tablet by mouth daily.    . VOLTAREN 1 % GEL APPLY ONE APPLICATION TOPICALLY 3 TIMES DAILY (Patient taking differently: APPLY ONE APPLICATION TOPICALLY daily as needed for shoulder pain) 100 g 3  . colchicine 0.6 MG tablet Take 1 tablet (0.6 mg total) by mouth daily as needed (gout flare).     No facility-administered medications prior to visit.       Per HPI unless specifically indicated in ROS section below Review of Systems Objective:    BP 122/78 (BP Location: Left Arm, Patient Position: Sitting, Cuff Size: Normal)   Pulse 66   Temp 97.8 F (36.6 C) (Temporal)   Ht 5' 1.5" (1.562 m)   Wt 152 lb 2 oz (69 kg)   SpO2 96%   BMI 28.28 kg/m   Wt Readings from Last 3 Encounters:  02/21/19 152 lb 2 oz (69 kg)  01/04/19 153 lb (69.4 kg)  11/02/18 154 lb 9.6 oz (70.1 kg)    Physical Exam Vitals signs and nursing note reviewed.  Constitutional:      General: She is not in acute distress.    Appearance: Normal appearance. She is not ill-appearing.  HENT:     Head: Normocephalic and atraumatic.     Mouth/Throat:     Mouth: Mucous membranes are moist.     Pharynx: Oropharynx is clear. No posterior oropharyngeal erythema.  Cardiovascular:     Rate and Rhythm: Normal rate and regular rhythm.     Pulses: Normal pulses.     Heart sounds: Murmur (3/6 systolic) present.  Pulmonary:     Effort: Pulmonary effort is normal. No respiratory distress.     Breath sounds: Normal breath sounds. No wheezing, rhonchi or rales.  Musculoskeletal:     Right lower leg: No edema.     Left lower leg: No edema.  Neurological:     Mental Status: She is alert.  Psychiatric:        Mood and Affect: Mood normal.        Behavior: Behavior normal.       Results for orders placed or performed in visit on 11/14/18  GI Profile, Stool, PCR  Result Value Ref Range   Campylobacter Not Detected Not Detected   C difficile toxin A/B Detected (A) Not Detected   Plesiomonas shigelloides Not Detected Not Detected   Salmonella Not Detected Not Detected   Vibrio Not Detected Not Detected   Vibrio cholerae Not Detected Not Detected   Yersinia enterocolitica Not Detected Not Detected   Enteroaggregative E coli Not Detected Not Detected   Enteropathogenic E coli Not Detected Not Detected   Enterotoxigenic E coli Not Detected Not Detected    Shiga-toxin-producing E coli Not Detected Not Detected   E coli [id number] Not applicable Not Detected   Shigella/Enteroinvasive E coli Not Detected Not Detected   Cryptosporidium Not Detected Not Detected   Cyclospora cayetanensis Not Detected Not Detected   Entamoeba histolytica Not Detected Not Detected   Giardia lamblia Not Detected Not Detected   Adenovirus F 40/41 Not Detected Not Detected   Astrovirus Not Detected Not Detected   Norovirus GI/GII Not Detected Not Detected  Rotavirus A Not Detected Not Detected   Sapovirus Not Detected Not Detected   Assessment & Plan:   Problem List Items Addressed This Visit    Pseudogout    S/p recent flare. Discussed PRN colchicine dosing. Refilled today.       Relevant Medications   colchicine 0.6 MG tablet   Osteoporosis - Primary    Severe. She continues daily vitamin D. Intolerant to calcium and oral bisphosphonates.  Will price out prolia for her as she's interested.       HYPERTENSION, BENIGN    Chronic, stable. Continue current regimen.       C. difficile diarrhea    Recurrent. Appreciate GI care. Continues vancomycin, awaiting fecal transplant option.           Meds ordered this encounter  Medications  . colchicine 0.6 MG tablet    Sig: Take 1 tablet (0.6 mg total) by mouth daily as needed (gout flare).    Dispense:  30 tablet    Refill:  1   No orders of the defined types were placed in this encounter.  Patient Instructions  We will price out prolia and call you with update. This is 1 shot every 6 months in office.  Return as needed or in 4-6 months for follow up visit.  If interested, check with pharmacy about new 2 shot shingles series (shingrix).    Follow up plan: Return in about 4 months (around 06/24/2019) for follow up visit.  [name], MD

## 2019-02-21 NOTE — Assessment & Plan Note (Addendum)
S/p recent flare. Discussed PRN colchicine dosing. Refilled today.

## 2019-02-22 DIAGNOSIS — H401131 Primary open-angle glaucoma, bilateral, mild stage: Secondary | ICD-10-CM | POA: Diagnosis not present

## 2019-02-22 DIAGNOSIS — H04123 Dry eye syndrome of bilateral lacrimal glands: Secondary | ICD-10-CM | POA: Diagnosis not present

## 2019-02-23 ENCOUNTER — Ambulatory Visit: Payer: PPO | Admitting: Family Medicine

## 2019-02-23 NOTE — Telephone Encounter (Signed)
Benefits submitted to [employer] for Prolia.

## 2019-02-27 ENCOUNTER — Other Ambulatory Visit: Payer: Self-pay | Admitting: Family Medicine

## 2019-02-27 NOTE — Telephone Encounter (Signed)
Electronic refill request Clonazepam Last refill 11/03/18 #30/3 Last office visit 02/21/19

## 2019-02-28 NOTE — Telephone Encounter (Signed)
ERx 

## 2019-03-15 ENCOUNTER — Telehealth: Payer: Self-pay

## 2019-03-15 NOTE — Telephone Encounter (Signed)
Spoke w/pt and pt's daughter, Brooke Beasley/  They will call [facility] to check if more beneficial to order Prolia thru [facility] or local pharmacy vs. Buy and bill MD office and call me back.

## 2019-03-17 ENCOUNTER — Other Ambulatory Visit: Payer: Self-pay | Admitting: Family Medicine

## 2019-03-18 ENCOUNTER — Other Ambulatory Visit: Payer: Self-pay

## 2019-03-18 ENCOUNTER — Encounter (HOSPITAL_BASED_OUTPATIENT_CLINIC_OR_DEPARTMENT_OTHER): Payer: Self-pay | Admitting: Emergency Medicine

## 2019-03-18 ENCOUNTER — Emergency Department (HOSPITAL_BASED_OUTPATIENT_CLINIC_OR_DEPARTMENT_OTHER)
Admission: EM | Admit: 2019-03-18 | Discharge: 2019-03-18 | Disposition: A | Discharge status: Home / Self Care | Payer: PPO | Attending: Emergency Medicine | Admitting: Emergency Medicine

## 2019-03-18 DIAGNOSIS — Z79899 Other long term (current) drug therapy: Secondary | ICD-10-CM | POA: Diagnosis not present

## 2019-03-18 DIAGNOSIS — M25511 Pain in right shoulder: Secondary | ICD-10-CM | POA: Diagnosis not present

## 2019-03-18 DIAGNOSIS — N183 Chronic kidney disease, stage 3 unspecified: Secondary | ICD-10-CM | POA: Insufficient documentation

## 2019-03-18 DIAGNOSIS — Z96612 Presence of left artificial shoulder joint: Secondary | ICD-10-CM | POA: Diagnosis not present

## 2019-03-18 DIAGNOSIS — I129 Hypertensive chronic kidney disease with stage 1 through stage 4 chronic kidney disease, or unspecified chronic kidney disease: Secondary | ICD-10-CM | POA: Diagnosis not present

## 2019-03-18 DIAGNOSIS — E039 Hypothyroidism, unspecified: Secondary | ICD-10-CM | POA: Insufficient documentation

## 2019-03-18 MED ORDER — HYDROCODONE-ACETAMINOPHEN 5-325 MG PO TABS: 1.0000 | ORAL_TABLET | Freq: Four times a day (QID) | ORAL | 0 refills | Status: DC | PRN

## 2019-03-18 MED ORDER — METHOCARBAMOL 500 MG PO TABS: 500.0000 mg | ORAL_TABLET | Freq: Three times a day (TID) | ORAL | 0 refills | Status: DC | PRN

## 2019-03-18 MED ORDER — KETOROLAC TROMETHAMINE 30 MG/ML IJ SOLN
15.0000 mg | Freq: Once | INTRAMUSCULAR | Status: AC
  Administered 2019-03-18: 15 mg via INTRAMUSCULAR
  Filled 2019-03-18: qty 1

## 2019-03-18 NOTE — ED Triage Notes (Signed)
R upper arm pain since last night, states she is unable to lift arm. Denies injury.

## 2019-03-18 NOTE — ED Provider Notes (Signed)
[facility] EMERGENCY DEPARTMENT Provider Note   CSN: [id number] Arrival date & time: 03/18/19  [id number]     History   Chief Complaint Chief Complaint  Patient presents with  . Arm Pain    HPI Brooke Beasley is a 85 y.o. female.     HPI Patient presents with right shoulder pain.  Has had issues with this in the past but occurred again last night.  States it hurts to move as she can not move this much as before.  Has had diagnosis of adhesive capsulitis in the past.  Has seen Dr. Tamera Punt from orthopedic surgery and said there is not much that he can do right now.  Took some Voltaren gel and a pill of some kind.  States that it helps some. Past Medical History:  Diagnosis Date  . Anxiety   . CAP (community acquired pneumonia) 12/22/2017  . Carotid stenosis 10/18/2014   R 1-39%, L 40-59%, rpt 1 yr (09/2014)   . CKD (chronic kidney disease) stage 3, GFR 30-59 ml/min 08/02/2014  . Degenerative disc disease    LS  . Depression    nervous breakdonw in 1964-out of work for a year  . Diverticulosis    severe by colonoscopy  . Family history of adverse reaction to anesthesia    n/v  . GERD (gastroesophageal reflux disease)   . Glaucoma   . Heart murmur 5/13   mitral regurge - on echo   . History of hiatal hernia   . History of shingles   . HTN (hypertension)   . Hyperlipidemia   . Hypertension   . Hypothyroidism   . IBS 11/02/2006  . Intestinal bacterial overgrowth    In small colon  . Maxillary fracture ([place]) 04/08/2012  . Osteoarthritis 2016   Jefm Bryant)  . Osteoporosis    dexa 2011  . Pseudogout 2016   shoulders ([place])    Patient Active Problem List   Diagnosis Date Noted  . Cough 07/11/2018  . C. difficile diarrhea 07/11/2018  . Chest congestion 02/21/2018  . Pedal edema 12/29/2016  . BRBPR (bright red blood per rectum) 04/09/2016  . Weakness 04/09/2016  . Complete tear of left rotator cuff 02/13/2016  . Bowel habit changes 01/13/2016  .  Hemorrhoids, external 01/13/2016  . Thumb pain, left 12/22/2015  . Status post total shoulder replacement 11/27/2015  . Health maintenance examination 10/07/2015  . Pseudogout   . Primary osteoarthritis of right shoulder 11/21/2014  . Left shoulder pain 11/04/2014  . Carotid stenosis 10/18/2014  . Advanced care planning/counseling discussion 09/26/2014  . Diverticulosis of colon without hemorrhage 09/26/2014  . CKD (chronic kidney disease) stage 3, GFR 30-59 ml/min 08/02/2014  . Restless legs 07/04/2014  . Dizziness 06/13/2014  . Chronic abdominal pain 01/07/2014  . Primary osteoarthritis of both hands 11/21/2013  . Insomnia 10/23/2013  . Medicare annual wellness visit, subsequent 06/08/2013  . GERD (gastroesophageal reflux disease) 04/02/2013  . Anxiety associated with depression 12/26/2012  . Prediabetes 03/20/2012  . Heart murmur 09/13/2011  . Syncope 08/02/2011  . HYPERTENSION, BENIGN 02/24/2007  . Hypothyroidism 11/02/2006  . HLD (hyperlipidemia) 11/02/2006  . Glaucoma 11/02/2006  . Internal hemorrhoids 11/02/2006  . Chronic constipation 11/02/2006  . Irritable bowel syndrome with constipation 11/02/2006  . Osteoporosis 11/02/2006  . Osteoarthritis 10/20/2006    Past Surgical History:  Procedure Laterality Date  . barium enema  2012   severe diverticulosis, redundant colon  . Bowel obstruction  1999   no surgery in  hosp x 3 days  . COLONOSCOPY  1. 1999  2. 11/04   1. Not finished  2. Slight hemorrhage rectosigmoid area, severe sig diverticulosis  . Dexa  1. [id number]   2. 9/04  3. 3/08    1. OP  2. OP, borderline, spine -2.44T  3. decreased BMD-OP  . DEXA  2011   OP in spine  . DG KNEE 1-2 VIEWS BILAT     LS x-ray with degenerative disc and facet change  . ESOPHAGOGASTRODUODENOSCOPY     Negative  . Hemorrhoid procedure  4/08  . JOINT REPLACEMENT    . TONSILLECTOMY    . TOTAL SHOULDER ARTHROPLASTY Left 11/27/2015   Corky Mull, MD  . TOTAL SHOULDER REVISION  Left 03/16/2016   Procedure: TOTAL SHOULDER REVISION;  Surgeon: Corky Mull, MD;  Location: [facility] ORS;  Service: Orthopedics;  Laterality: Left;  . US ECHOCARDIOGRAPHY  [id number]   Normal systolic fxn with EF 0000000.  Focal basal septal hypertrophy.  Mild diastolic dysfunction.  Mild MR.     OB History   No obstetric history on file.      Home Medications    Prior to Admission medications   Medication Sig Start Date End Date Taking? Authorizing Provider  acetaminophen (TYLENOL) 500 MG tablet Take 500-1,000 mg by mouth at bedtime. Depends on pain    [provider]  albuterol (PROVENTIL HFA;VENTOLIN HFA) 108 (90 Base) MCG/ACT inhaler Inhale 2 puffs into the lungs every 6 (six) hours as needed for wheezing or shortness of breath. 01/12/18   [name], MD  amLODipine (NORVASC) 10 MG tablet Take 1 tablet by mouth once daily 11/20/18   [name], MD  bimatoprost (LUMIGAN) 0.01 % SOLN Place 1 drop into both eyes at bedtime.    [provider]  Bioflavonoid Products (VITAMIN C) CHEW Chew 1 tablet by mouth daily.    [provider]  cholecalciferol (VITAMIN D) 1000 units tablet Take 1,000 Units by mouth daily.    [provider]  clonazePAM (KLONOPIN) 0.5 MG tablet TAKE 1 TABLET BY MOUTH AT BEDTIME AS NEEDED FOR ANXIETY 02/28/19   [name], MD  colchicine 0.6 MG tablet Take 1 tablet (0.6 mg total) by mouth daily as needed (gout flare). 02/21/19   [name], MD  fish oil-omega-3 fatty acids 1000 MG capsule Take 1 g by mouth daily.     [provider]  furosemide (LASIX) 20 MG tablet TAKE 1 TABLET BY MOUTH ONCE DAILY AS NEEDED FOR EDEMA 10/21/18   [name], MD  HYDROcodone-acetaminophen (NORCO/VICODIN) 5-325 MG tablet Take 1-2 tablets by mouth every 6 (six) hours as needed. 03/18/19   Davonna Belling, MD  hydrocortisone (ANUSOL-HC) 2.5 % rectal cream Place 1 application rectally 2 (two) times daily. 10/23/18    [name], MD  levothyroxine (SYNTHROID) 100 MCG tablet Take 1 tablet by mouth once daily 11/30/18   [name], MD  methocarbamol (ROBAXIN) 500 MG tablet Take 1 tablet (500 mg total) by mouth every 8 (eight) hours as needed for muscle spasms. 03/18/19   Davonna Belling, MD  Misc Natural Products (OSTEO BI-FLEX TRIPLE STRENGTH PO) Take 1 tablet by mouth daily. With Vit D    [provider]  omeprazole (PRILOSEC) 20 MG capsule Take 1 capsule (20 mg total) by mouth daily. 05/25/18   [name], MD  potassium chloride (K-DUR) 10 MEQ tablet TAKE 1 TABLET BY MOUTH ONCE DAILY AS NEEDED (WITH  LASIX  (FUROSEMIDE)) 01/03/19  [name], MD  sertraline (ZOLOFT) 100 MG tablet Take 1 tablet (100 mg total) by mouth daily. 06/23/18   [name], MD  TURMERIC PO Take 1 tablet by mouth daily.    [provider]  vancomycin (VANCOCIN) 125 MG capsule Take 1 capsule (125 mg total) by mouth daily. 01/17/19 03/18/19  Lin Landsman, MD  VITAMIN E PO Take 1 tablet by mouth daily.    [provider]  VOLTAREN 1 % GEL APPLY ONE APPLICATION TOPICALLY 3 TIMES DAILY Patient taking differently: APPLY ONE APPLICATION TOPICALLY daily as needed for shoulder pain 09/26/14   [name], MD    Family History Family History  Problem Relation Age of Onset  . Diabetes Brother        post-op  . Coronary artery disease Brother   . Diabetes Paternal Grandfather   . Breast cancer Cousin     Social History Social History   Tobacco Use  . Smoking status: Never Smoker  . Smokeless tobacco: Never Used  Substance Use Topics  . Alcohol use: No  . Drug use: No     Allergies   Prednisone, Ace inhibitors, Alendronate sodium, Losartan, Other, Paroxetine hcl, Risedronate sodium, Silenor [doxepin hcl], Simvastatin, Sulfonamide derivatives, Tramadol, and Influenza vaccines   Review of Systems Review of Systems  Constitutional: Negative for appetite change.   Respiratory: Negative for shortness of breath.   Cardiovascular: Negative for chest pain.  Musculoskeletal:       Right shoulder pain.  Skin: Negative for rash.  Neurological: Negative for weakness and numbness.     Physical Exam Updated Vital Signs BP 134/82 (BP Location: Left Arm)   Pulse 73   Temp 98.3 F (36.8 C) (Oral)   Resp 16   Ht 5\' 1"  (1.549 m)   Wt 68 kg   SpO2 97%   BMI 28.34 kg/m   Physical Exam Vitals signs and nursing note reviewed.  Pulmonary:     Breath sounds: No wheezing or rhonchi.  Musculoskeletal:     Comments: Decreased range of motion in right shoulder.  Some tenderness anteriorly.  Neurovascular intact in right hand.  No tenderness over wrist or elbow with good range of motion.  No rash.  Joint does not appear irritable.  Skin:    General: Skin is warm.     Capillary Refill: Capillary refill takes less than 2 seconds.  Neurological:     Mental Status: She is alert.      ED Treatments / Results  Labs (all labs ordered are listed, but only abnormal results are displayed) Labs Reviewed - No data to display  EKG None  Radiology No results found.  Procedures Procedures (including critical care time)  Medications Ordered in ED Medications  ketorolac (TORADOL) 30 MG/ML injection 15 mg (has no administration in time range)     Initial Impression / Assessment and Plan / ED Course  I have reviewed the triage vital signs and the nursing notes.  Pertinent labs & imaging results that were available during my care of the patient were reviewed by me and considered in my medical decision making (see chart for details).        Patient with shoulder pain.  History of same.  Joint appears located.  Do not think x-ray will really add much at this time.  Has orthopedic she can follow-up with.  Requested shot of Toradol here and was given.  Will give pain medicine that she has tolerated in the past and  Robaxin.  Discharge home.  Final Clinical  Impressions(s) / ED Diagnoses   Final diagnoses:  Acute pain of right shoulder    ED Discharge Orders         Ordered    HYDROcodone-acetaminophen (NORCO/VICODIN) 5-325 MG tablet  Every 6 hours PRN     03/18/19 0947    methocarbamol (ROBAXIN) 500 MG tablet  Every 8 hours PRN     03/18/19 0947           Davonna Belling, MD 03/18/19 1005

## 2019-03-19 ENCOUNTER — Telehealth: Payer: Self-pay

## 2019-03-19 MED ORDER — DENOSUMAB 60 MG/ML ~~LOC~~ SOSY: 60.0000 mg | PREFILLED_SYRINGE | SUBCUTANEOUS | 1 refills | Status: DC

## 2019-03-19 NOTE — Telephone Encounter (Addendum)
Sent. Thanks.   

## 2019-03-19 NOTE — Telephone Encounter (Signed)
Prolia will be cheaper through specialty pharmacy.  Please send Rx to pt's specialty pharmacy [facility].  Need to have shipped here for administration.

## 2019-03-19 NOTE — Addendum Note (Signed)
Addended by: [name] on: 03/19/2019 06:07 PM   Modules accepted: Orders

## 2019-03-27 ENCOUNTER — Encounter: Payer: Self-pay | Admitting: Gastroenterology

## 2019-03-27 ENCOUNTER — Ambulatory Visit (INDEPENDENT_AMBULATORY_CARE_PROVIDER_SITE_OTHER): Payer: PPO | Admitting: Gastroenterology

## 2019-03-27 DIAGNOSIS — Z8619 Personal history of other infectious and parasitic diseases: Secondary | ICD-10-CM | POA: Diagnosis not present

## 2019-03-27 DIAGNOSIS — A0471 Enterocolitis due to Clostridium difficile, recurrent: Secondary | ICD-10-CM

## 2019-03-27 DIAGNOSIS — R197 Diarrhea, unspecified: Secondary | ICD-10-CM

## 2019-03-27 NOTE — Telephone Encounter (Signed)
Made appointment today at 3 

## 2019-03-27 NOTE — Progress Notes (Signed)
Brooke Sear, MD [address]  [place]  [place], [place] [address]  Main: [phone]  Fax: [phone]    Gastroenterology Consultation Tele Visit  Referring Provider:     [name], MD Primary Care Physician:  [name], MD Primary Gastroenterologist:  Dr. Cephas Darby Reason for Consultation:   Recurrent C. difficile infection        HPI:   Brooke Beasley is a 85 y.o. female referred by Dr. [name], MD  for consultation & management of recurrent C. difficile infection  Virtual Visit via Telephone Note  I connected with Brooke Beasley on 03/27/19 at  3:00 PM EST by telephone and verified that I am speaking with the correct person using two identifiers.   I discussed the limitations, risks, security and privacy concerns of performing an evaluation and management service by telephone and the availability of in person appointments. I also discussed with the patient that there may be a patient responsible charge related to this service. The patient expressed understanding and agreed to proceed.  Location of the Patient: Home  Location of the provider: Office  Persons participating in the visit: Patient, patient's daughter and provider  History of Present Illness: Brooke Beasley has recurrence of C. difficile diarrhea, currently maintained on oral vancomycin 1 pill daily.  She had 3 recurrences so far.  Fidaxomicin did not work well.  I have evaluated for FMT, unable to undergo due to COVID-19 pandemic.  I also tried to refer her to [facility] where FMT is on hold unless it is urgent.  For last 2 weeks, patient has been experiencing worsening of diarrhea, about 5 times a day associated with abdominal cramps, poor appetite.  She denies nausea or vomiting.  She does feel sick in her stomach.  She denies fever, chills.  Her daughter messaged me via MyChart today and therefore I arranged a televisit.    NSAIDs:  None  Antiplts/Anticoagulants/Anti thrombotics: None  GI Procedures: Flexible sigmoidoscopy, incomplete colonoscopy CT colonography  Past Medical History:  Diagnosis Date  . Anxiety   . CAP (community acquired pneumonia) 12/22/2017  . Carotid stenosis 10/18/2014   R 1-39%, L 40-59%, rpt 1 yr (09/2014)   . CKD (chronic kidney disease) stage 3, GFR 30-59 ml/min 08/02/2014  . Degenerative disc disease    LS  . Depression    nervous breakdonw in 1964-out of work for a year  . Diverticulosis    severe by colonoscopy  . Family history of adverse reaction to anesthesia    n/v  . GERD (gastroesophageal reflux disease)   . Glaucoma   . Heart murmur 5/13   mitral regurge - on echo   . History of hiatal hernia   . History of shingles   . HTN (hypertension)   . Hyperlipidemia   . Hypertension   . Hypothyroidism   . IBS 11/02/2006  . Intestinal bacterial overgrowth    In small colon  . Maxillary fracture ([place]) 04/08/2012  . Osteoarthritis 2016   Jefm Bryant)  . Osteoporosis    dexa 2011  . Pseudogout 2016   shoulders ([place])    Past Surgical History:  Procedure Laterality Date  . barium enema  2012   severe diverticulosis, redundant colon  . Bowel obstruction  1999   no surgery in hosp x 3 days  . COLONOSCOPY  1. 1999  2. 11/04   1. Not finished  2. Slight hemorrhage rectosigmoid area, severe sig diverticulosis  . Dexa  1. [id number]   2. 9/04  3. 3/08    1. OP  2. OP, borderline, spine -2.44T  3. decreased BMD-OP  . DEXA  2011   OP in spine  . DG KNEE 1-2 VIEWS BILAT     LS x-ray with degenerative disc and facet change  . ESOPHAGOGASTRODUODENOSCOPY     Negative  . Hemorrhoid procedure  4/08  . JOINT REPLACEMENT    . TONSILLECTOMY    . TOTAL SHOULDER ARTHROPLASTY Left 11/27/2015   Corky Mull, MD  . TOTAL SHOULDER REVISION Left 03/16/2016   Procedure: TOTAL SHOULDER REVISION;  Surgeon: Corky Mull, MD;  Location: [facility] ORS;  Service: Orthopedics;  Laterality: Left;  . US  ECHOCARDIOGRAPHY  [id number]   Normal systolic fxn with EF 0000000.  Focal basal septal hypertrophy.  Mild diastolic dysfunction.  Mild MR.    Current Outpatient Medications:  .  acetaminophen (TYLENOL) 500 MG tablet, Take 500-1,000 mg by mouth at bedtime. Depends on pain, Disp: , Rfl:  .  albuterol (PROVENTIL HFA;VENTOLIN HFA) 108 (90 Base) MCG/ACT inhaler, Inhale 2 puffs into the lungs every 6 (six) hours as needed for wheezing or shortness of breath., Disp: 1 Inhaler, Rfl: 0 .  amLODipine (NORVASC) 10 MG tablet, Take 1 tablet by mouth once daily, Disp: 90 tablet, Rfl: 3 .  bimatoprost (LUMIGAN) 0.01 % SOLN, Place 1 drop into both eyes at bedtime., Disp: , Rfl:  .  Bioflavonoid Products (VITAMIN C) CHEW, Chew 1 tablet by mouth daily., Disp: , Rfl:  .  cholecalciferol (VITAMIN D) 1000 units tablet, Take 1,000 Units by mouth daily., Disp: , Rfl:  .  clonazePAM (KLONOPIN) 0.5 MG tablet, TAKE 1 TABLET BY MOUTH AT BEDTIME AS NEEDED FOR ANXIETY, Disp: 30 tablet, Rfl: 0 .  colchicine 0.6 MG tablet, Take 1 tablet (0.6 mg total) by mouth daily as needed (gout flare)., Disp: 30 tablet, Rfl: 1 .  denosumab (PROLIA) 60 MG/ML SOSY injection, Inject 60 mg into the skin every 6 (six) months., Disp: 1 mL, Rfl: 1 .  fish oil-omega-3 fatty acids 1000 MG capsule, Take 1 g by mouth daily. , Disp: , Rfl:  .  furosemide (LASIX) 20 MG tablet, TAKE 1 TABLET BY MOUTH ONCE DAILY AS NEEDED FOR EDEMA, Disp: 90 tablet, Rfl: 0 .  HYDROcodone-acetaminophen (NORCO/VICODIN) 5-325 MG tablet, Take 1-2 tablets by mouth every 6 (six) hours as needed., Disp: 6 tablet, Rfl: 0 .  hydrocortisone (ANUSOL-HC) 2.5 % rectal cream, Place 1 application rectally 2 (two) times daily., Disp: 30 g, Rfl: 0 .  levothyroxine (SYNTHROID) 100 MCG tablet, Take 1 tablet by mouth once daily, Disp: 90 tablet, Rfl: 1 .  methocarbamol (ROBAXIN) 500 MG tablet, Take 1 tablet (500 mg total) by mouth every 8 (eight) hours as needed for muscle spasms., Disp: 8  tablet, Rfl: 0 .  Misc Natural Products (OSTEO BI-FLEX TRIPLE STRENGTH PO), Take 1 tablet by mouth daily. With Vit D, Disp: , Rfl:  .  omeprazole (PRILOSEC) 20 MG capsule, Take 1 capsule (20 mg total) by mouth daily., Disp: 90 capsule, Rfl: 3 .  potassium chloride (K-DUR) 10 MEQ tablet, TAKE 1 TABLET BY MOUTH ONCE DAILY AS NEEDED (WITH  LASIX  (FUROSEMIDE)), Disp: 90 tablet, Rfl: 0 .  sertraline (ZOLOFT) 100 MG tablet, Take 1 tablet (100 mg total) by mouth daily., Disp: 90 tablet, Rfl: 1 .  TURMERIC PO, Take 1 tablet by mouth daily., Disp: , Rfl:  .  VITAMIN E PO, Take  1 tablet by mouth daily., Disp: , Rfl:  .  VOLTAREN 1 % GEL, APPLY ONE APPLICATION TOPICALLY 3 TIMES DAILY (Patient taking differently: APPLY ONE APPLICATION TOPICALLY daily as needed for shoulder pain), Disp: 100 g, Rfl: 3   Family History  Problem Relation Age of Onset  . Diabetes Brother        post-op  . Coronary artery disease Brother   . Diabetes Paternal Grandfather   . Breast cancer Cousin      Social History   Tobacco Use  . Smoking status: Never Smoker  . Smokeless tobacco: Never Used  Substance Use Topics  . Alcohol use: No  . Drug use: No    Allergies as of 03/27/2019 - Review Complete 03/27/2019  Allergen Reaction Noted  . Prednisone Other (See Comments) 11/12/2015  . Ace inhibitors Other (See Comments) 04/02/2008  . Alendronate sodium  09/14/2006  . Losartan Other (See Comments) 09/26/2014  . Other Other (See Comments) 11/12/2015  . Paroxetine hcl Other (See Comments) 06/15/2011  . Risedronate sodium  03/13/2007  . Silenor [doxepin hcl] Other (See Comments) 04/02/2013  . Simvastatin  10/03/2009  . Sulfonamide derivatives  06/08/2006  . Tramadol Other (See Comments) 11/12/2015  . Influenza vaccines Rash and Other (See Comments) 05/24/2014     Imaging Studies:   Assessment and Plan:   Cristol Chilcote is a 85 y.o. female with history of severe diverticulosis, depression, anxiety,  insomnia, severe constipation, abdominal bloating followed by antibiotic associated C. difficile diarrhea in 2/2020s/p 10days of oral vancomycin, 1st recurrence in 06/2018,treated with 2 weeks of Dificidwith prolonged taper.  Second recurrence in 09/2018, currently maintained on long-term oral vancomycin.  C. difficile toxin returned positive on 11/14/2018 while on oral vancomycin.  She now presents with worsening of diarrhea for last 2 weeks associated with abdominal cramps, nausea.  Concern for recurrence of C. Difficile  Check CBC with differential, CMP, electrolytes, stool studies to rule out other infection Urgent referral placed to Dr. Karolee Ohs to evaluate for Zinplava   Follow Up Instructions:   I discussed the assessment and treatment plan with the patient. The patient was provided an opportunity to ask questions and all were answered. The patient agreed with the plan and demonstrated an understanding of the instructions.   The patient was advised to call back or seek an in-person evaluation if the symptoms worsen or if the condition fails to improve as anticipated.  I provided 14 minutes of non-face-to-face time during this encounter.   Follow up in 2weeks   Cephas Darby, MD

## 2019-03-27 NOTE — Telephone Encounter (Signed)
Called and left a message for call back to scheduled the appointment.Called and left a message with daughter and primary number

## 2019-03-28 DIAGNOSIS — A0471 Enterocolitis due to Clostridium difficile, recurrent: Secondary | ICD-10-CM | POA: Diagnosis not present

## 2019-03-28 NOTE — Telephone Encounter (Signed)
[facility] left v/m that pharmacy is trying to fill Prolia but needs cb with ICD 10.

## 2019-03-28 NOTE — Telephone Encounter (Signed)
Spoke [email or web address].  ICD 10 is M81.0.

## 2019-03-29 LAB — CBC WITH DIFFERENTIAL/PLATELET
Basophils Absolute: 0 10*3/uL (ref 0.0–0.2)
Basos: 1 %
EOS (ABSOLUTE): 0.2 10*3/uL (ref 0.0–0.4)
Eos: 3 %
Hematocrit: 43.9 % (ref 34.0–46.6)
Hemoglobin: 14.5 g/dL (ref 11.1–15.9)
Immature Grans (Abs): 0 10*3/uL (ref 0.0–0.1)
Immature Granulocytes: 0 %
Lymphocytes Absolute: 2 10*3/uL (ref 0.7–3.1)
Lymphs: 30 %
MCH: 29.7 pg (ref 26.6–33.0)
MCHC: 33 g/dL (ref 31.5–35.7)
MCV: 90 fL (ref 79–97)
Monocytes Absolute: 0.9 10*3/uL (ref 0.1–0.9)
Monocytes: 13 %
Neutrophils Absolute: 3.6 10*3/uL (ref 1.4–7.0)
Neutrophils: 53 %
Platelets: 277 10*3/uL (ref 150–450)
RBC: 4.88 x10E6/uL (ref 3.77–5.28)
RDW: 12.9 % (ref 11.7–15.4)
WBC: 6.8 10*3/uL (ref 3.4–10.8)

## 2019-03-29 LAB — PHOSPHORUS: Phosphorus: 3.1 mg/dL (ref 3.0–4.3)

## 2019-03-29 LAB — COMPREHENSIVE METABOLIC PANEL
ALT: 15 IU/L (ref 0–32)
AST: 20 IU/L (ref 0–40)
Albumin/Globulin Ratio: 1.9 (ref 1.2–2.2)
Albumin: 4.4 g/dL (ref 3.6–4.6)
Alkaline Phosphatase: 117 IU/L (ref 39–117)
BUN/Creatinine Ratio: 19 (ref 12–28)
BUN: 19 mg/dL (ref 8–27)
Bilirubin Total: 0.3 mg/dL (ref 0.0–1.2)
CO2: 26 mmol/L (ref 20–29)
Calcium: 9.4 mg/dL (ref 8.7–10.3)
Chloride: 100 mmol/L (ref 96–106)
Creatinine, Ser: 1.01 mg/dL — ABNORMAL HIGH (ref 0.57–1.00)
GFR calc Af Amer: 58 mL/min/{1.73_m2} — ABNORMAL LOW (ref >59)
GFR calc non Af Amer: 51 mL/min/{1.73_m2} — ABNORMAL LOW (ref >59)
Globulin, Total: 2.3 g/dL (ref 1.5–4.5)
Glucose: 95 mg/dL (ref 65–99)
Potassium: 4.1 mmol/L (ref 3.5–5.2)
Sodium: 139 mmol/L (ref 134–144)
Total Protein: 6.7 g/dL (ref 6.0–8.5)

## 2019-03-29 LAB — MAGNESIUM: Magnesium: 2.1 mg/dL (ref 1.6–2.3)

## 2019-03-30 DIAGNOSIS — M542 Cervicalgia: Secondary | ICD-10-CM | POA: Diagnosis not present

## 2019-03-30 DIAGNOSIS — M19011 Primary osteoarthritis, right shoulder: Secondary | ICD-10-CM | POA: Diagnosis not present

## 2019-03-31 ENCOUNTER — Other Ambulatory Visit: Payer: Self-pay | Admitting: Family Medicine

## 2019-04-02 ENCOUNTER — Telehealth: Payer: Self-pay

## 2019-04-02 ENCOUNTER — Telehealth: Payer: Self-pay | Admitting: Family Medicine

## 2019-04-02 LAB — GI PROFILE, STOOL, PCR
Adenovirus F 40/41: NOT DETECTED
Astrovirus: NOT DETECTED
C difficile toxin A/B: NOT DETECTED
Campylobacter: NOT DETECTED
Cryptosporidium: NOT DETECTED
Cyclospora cayetanensis: NOT DETECTED
Entamoeba histolytica: NOT DETECTED
Enteroaggregative E coli: NOT DETECTED
Enteropathogenic E coli: DETECTED — AB
Enterotoxigenic E coli: NOT DETECTED
Giardia lamblia: NOT DETECTED
Norovirus GI/GII: NOT DETECTED
Plesiomonas shigelloides: NOT DETECTED
Rotavirus A: NOT DETECTED
Salmonella: NOT DETECTED
Sapovirus: NOT DETECTED
Shiga-toxin-producing E coli: NOT DETECTED
Shigella/Enteroinvasive E coli: NOT DETECTED
Vibrio cholerae: NOT DETECTED
Vibrio: NOT DETECTED
Yersinia enterocolitica: NOT DETECTED

## 2019-04-02 LAB — C DIFFICILE, CYTOTOXIN B

## 2019-04-02 LAB — C DIFFICILE TOXINS A+B W/RFLX: C difficile Toxins A+B, EIA: NEGATIVE

## 2019-04-02 NOTE — Telephone Encounter (Signed)
-----   Message from Lin Landsman, MD sent at 04/02/2019 10:37 AM EST ----- Regarding: labs follow up Please call patient/or daughter to review results I sent on Mychart I still recommend that she sees ID specialist in case if she develops recurrence of C Diff  Thanks RV

## 2019-04-02 NOTE — Telephone Encounter (Signed)
Patient daughter verbalized understanding. She states she has a appointment with infectious disease on 04/18/2019. She did go and see ortho and they put her on prednisone.

## 2019-04-02 NOTE — Telephone Encounter (Signed)
[place] with [facility] called. She stated they received the patient prescription for Prolia. [place] tried to reach out to the patient and at that time the patient refused hippa and she was no able to verify who the patient was to tell her they received the prolia prescription,. patient stated she was no interested at this time   [place] just wanted to make sure our office was aware that they did try to contact the patient

## 2019-04-03 ENCOUNTER — Encounter: Payer: Self-pay | Admitting: Gastroenterology

## 2019-04-03 NOTE — Telephone Encounter (Signed)
Left message on vm per dpr relaying Dr. Synthia Innocent message.  Asked pt to call back to let us know if she wants Prolia or not.

## 2019-04-03 NOTE — Telephone Encounter (Signed)
She was interested in prolia when we discussed this 01/2019 - plz call pt for update regarding prolia treatment - does she just prefer to postpone osteoporosis treatment until diarrhea has improved?  Ok to speak with daughter [name].

## 2019-04-03 NOTE — Telephone Encounter (Signed)
Erx

## 2019-04-04 MED ORDER — AZITHROMYCIN 500 MG PO TABS: 500.0000 mg | ORAL_TABLET | Freq: Every day | ORAL | 0 refills | Status: DC

## 2019-04-04 MED ORDER — DENOSUMAB 60 MG/ML ~~LOC~~ SOSY: 60.0000 mg | PREFILLED_SYRINGE | SUBCUTANEOUS | 1 refills | Status: DC

## 2019-04-04 NOTE — Telephone Encounter (Signed)
Please review

## 2019-04-04 NOTE — Addendum Note (Signed)
Addended by: [name] on: 04/04/2019 03:01 PM   Modules accepted: Orders

## 2019-04-04 NOTE — Telephone Encounter (Signed)
[place] Night - Client Nonclinical Telephone Record AccessNurse Client [place] Night - Client Client Site [place] Physician [name] - MD Contact Type Call Who Is Calling Patient / Member / Family / Caregiver Caller Name Cerena Cesaro Caller Phone Number [phone] Call Type Message Only Information Provided Reason for Call Returning a Call from the Office Initial [place] states they just got a call from the MD's Nurse. States they do want to proceed with the Prolivia medication. Additional Comment DOB [birth year] Disp. Time Disposition Final User 04/03/2019 5:10:47 PM General Information Provided Yes Arnaldo Natal

## 2019-04-04 NOTE — Telephone Encounter (Signed)
Spoke with pt relaying Dr. Synthia Innocent message.  Pt will call to schedule [place] for Prolia once she gets it from pharmacy.

## 2019-04-04 NOTE — Telephone Encounter (Deleted)
[place] Night - Client Nonclinical Telephone Record AccessNurse Client [place] Night - Client Client Site [place] Physician [name] - MD Contact Type Call Who Is Calling Patient / Member / Family / Caregiver Caller Name Yarisel Kuruc Caller Phone Number [phone] Call Type Message Only Information Provided Reason for Call Returning a Call from the Office Initial [place] states they just got a call from the MD's Nurse. States they do want to proceed with the Prolivia medication. Additional Comment DOB 10-30-1930 Disp. Time Disposition Final User 04/03/2019 5:10:47 PM General Information Provided Yes Arnaldo Natal

## 2019-04-04 NOTE — Telephone Encounter (Addendum)
Please have them contact [place] as script should be available at the pharmacy for her. Let us know how she would like to proceed to receive injection - whether at home or nurse visit here after she picks up prolia injection

## 2019-04-10 ENCOUNTER — Telehealth: Payer: Self-pay

## 2019-04-10 NOTE — Telephone Encounter (Signed)
Pt's daughter, [name], calling [facility] to see if Prolia being shipped to our office or sent to [facility] for them to pick up and bring to office.

## 2019-04-10 NOTE — Telephone Encounter (Signed)
LMOM.  Is pt picking up Prolia or having it shipped to office.

## 2019-04-10 NOTE — Telephone Encounter (Signed)
Pt's daughter called back.  Prolia had to go through special section of pharmacy.  It will be shipped to our office soon.

## 2019-04-17 ENCOUNTER — Telehealth: Payer: Self-pay

## 2019-04-17 NOTE — Telephone Encounter (Signed)
Spoke [email or web address].  They will ship Prolia to [place] to be delivered 04-24-2019.

## 2019-04-18 ENCOUNTER — Ambulatory Visit (INDEPENDENT_AMBULATORY_CARE_PROVIDER_SITE_OTHER): Payer: PPO | Admitting: Internal Medicine

## 2019-04-18 ENCOUNTER — Other Ambulatory Visit: Payer: Self-pay

## 2019-04-18 VITALS — Ht 61.0 in | Wt 154.0 lb

## 2019-04-18 DIAGNOSIS — A0471 Enterocolitis due to Clostridium difficile, recurrent: Secondary | ICD-10-CM

## 2019-04-18 NOTE — Progress Notes (Signed)
RFV: new patient for cdiffi  Patient ID: Brooke Beasley, female   DOB: December 21, 1930, 85 y.o.   MRN: [id number]  HPI Brooke Beasley in an 85yo F with hx of diverticulitis, but also remembers being treated for what sounds like bacterial overgrowth earlier in 2020 which led to having new onset diarrhea for which she was found to have cdifficile. She has been intermittently on 10 day cours vs vancomycin taper over the past 12 month due to recurrence. On daily vancomycin presently.   Has comes right back if off of meds  Dr Barb Merino at [place].   She also suffers from anemia thought to be due to hemorrhoids, however general surgeons will not do hemorrhoid surgery until cdifficile is cleared  ROS: +weakness. Sometimes having 3-4 times, occasionally 6 x/day. Now having mucousy stool. occ blood with hemorrhoids  Outpatient Encounter Medications as of 04/18/2019  Medication Sig  . acetaminophen (TYLENOL) 500 MG tablet Take 500-1,000 mg by mouth at bedtime. Depends on pain  . amLODipine (NORVASC) 10 MG tablet Take 1 tablet by mouth once daily  . bimatoprost (LUMIGAN) 0.01 % SOLN Place 1 drop into both eyes at bedtime.  Marland Kitchen Bioflavonoid Products (VITAMIN C) CHEW Chew 1 tablet by mouth daily.  . cholecalciferol (VITAMIN D) 1000 units tablet Take 1,000 Units by mouth daily.  . clonazePAM (KLONOPIN) 0.5 MG tablet TAKE 1 TABLET BY MOUTH AT BEDTIME AS NEEDED FOR ANXIETY  . colchicine 0.6 MG tablet Take 1 tablet (0.6 mg total) by mouth daily as needed (gout flare).  . fish oil-omega-3 fatty acids 1000 MG capsule Take 1 g by mouth daily.   . furosemide (LASIX) 20 MG tablet TAKE 1 TABLET BY MOUTH ONCE DAILY AS NEEDED FOR EDEMA  . hydrocortisone (ANUSOL-HC) 2.5 % rectal cream Place 1 application rectally 2 (two) times daily.  Marland Kitchen levothyroxine (SYNTHROID) 100 MCG tablet Take 1 tablet by mouth once daily  . Misc Natural Products (OSTEO BI-FLEX TRIPLE STRENGTH PO) Take 1 tablet by mouth daily. With Vit D  .  omeprazole (PRILOSEC) 20 MG capsule Take 1 capsule (20 mg total) by mouth daily.  . potassium chloride (K-DUR) 10 MEQ tablet TAKE 1 TABLET BY MOUTH ONCE DAILY AS NEEDED (WITH  LASIX  (FUROSEMIDE))  . sertraline (ZOLOFT) 100 MG tablet Take 1 tablet (100 mg total) by mouth daily.  . TURMERIC PO Take 1 tablet by mouth daily.  . vancomycin (VANCOCIN) 125 MG capsule Take 125 mg by mouth daily.  Marland Kitchen VITAMIN E PO Take 1 tablet by mouth daily.  . VOLTAREN 1 % GEL APPLY ONE APPLICATION TOPICALLY 3 TIMES DAILY (Patient taking differently: APPLY ONE APPLICATION TOPICALLY daily as needed for shoulder pain)  . albuterol (PROVENTIL HFA;VENTOLIN HFA) 108 (90 Base) MCG/ACT inhaler Inhale 2 puffs into the lungs every 6 (six) hours as needed for wheezing or shortness of breath. (Patient not taking: Reported on 04/18/2019)  . azithromycin (ZITHROMAX) 500 MG tablet Take 1 tablet (500 mg total) by mouth daily.  Marland Kitchen denosumab (PROLIA) 60 MG/ML SOSY injection Inject 60 mg into the skin every 6 (six) months. (Patient not taking: Reported on 04/18/2019)  . HYDROcodone-acetaminophen (NORCO/VICODIN) 5-325 MG tablet Take 1-2 tablets by mouth every 6 (six) hours as needed. (Patient not taking: Reported on 04/18/2019)  . methocarbamol (ROBAXIN) 500 MG tablet Take 1 tablet (500 mg total) by mouth every 8 (eight) hours as needed for muscle spasms. (Patient not taking: Reported on 04/18/2019)  . predniSONE (STERAPRED UNI-PAK 21 TAB) 10 MG (  21) TBPK tablet TAKE BY MOUTH AS DIRECTED ON INSIDE OF PACKAGE  . SUPREP BOWEL PREP KIT 17.5-3.13-1.6 GM/177ML SOLN See admin instructions.   No facility-administered encounter medications on file as of 04/18/2019.     Patient Active Problem List   Diagnosis Date Noted  . Cough 07/11/2018  . C. difficile diarrhea 07/11/2018  . Chest congestion 02/21/2018  . Pedal edema 12/29/2016  . BRBPR (bright red blood per rectum) 04/09/2016  . Weakness 04/09/2016  . Complete tear of left rotator cuff  02/13/2016  . Bowel habit changes 01/13/2016  . Hemorrhoids, external 01/13/2016  . Thumb pain, left 12/22/2015  . Status post total shoulder replacement 11/27/2015  . Health maintenance examination 10/07/2015  . Pseudogout   . Primary osteoarthritis of right shoulder 11/21/2014  . Left shoulder pain 11/04/2014  . Carotid stenosis 10/18/2014  . Advanced care planning/counseling discussion 09/26/2014  . Diverticulosis of colon without hemorrhage 09/26/2014  . CKD (chronic kidney disease) stage 3, GFR 30-59 ml/min 08/02/2014  . Restless legs 07/04/2014  . Dizziness 06/13/2014  . Chronic abdominal pain 01/07/2014  . Primary osteoarthritis of both hands 11/21/2013  . Insomnia 10/23/2013  . Medicare annual wellness visit, subsequent 06/08/2013  . GERD (gastroesophageal reflux disease) 04/02/2013  . Anxiety associated with depression 12/26/2012  . Prediabetes 03/20/2012  . Heart murmur 09/13/2011  . Syncope 08/02/2011  . HYPERTENSION, BENIGN 02/24/2007  . Hypothyroidism 11/02/2006  . HLD (hyperlipidemia) 11/02/2006  . Glaucoma 11/02/2006  . Internal hemorrhoids 11/02/2006  . Chronic constipation 11/02/2006  . Irritable bowel syndrome with constipation 11/02/2006  . Osteoporosis 11/02/2006  . Osteoarthritis 10/20/2006     Health Maintenance Due  Topic Date Due  . DTaP/Tdap/Td (1 - Tdap) 11/[birth year]/1951  . TETANUS/TDAP  10/23/2018    Social History   Tobacco Use  . Smoking status: Never Smoker  . Smokeless tobacco: Never Used  Substance Use Topics  . Alcohol use: No  . Drug use: No  family history includes Breast cancer in her cousin; Coronary artery disease in her brother; Diabetes in her brother and paternal grandfather. Review of Systems Review of Systems  Constitutional: +fatigue. Negative for fever, chills, diaphoresis, activity change, appetite change and unexpected weight change.  HENT: Negative for congestion, sore throat, rhinorrhea, sneezing, trouble swallowing and  sinus pressure.  Eyes: Negative for photophobia and visual disturbance.  Respiratory: Negative for cough, chest tightness, shortness of breath, wheezing and stridor.  Cardiovascular: Negative for chest pain, palpitations and leg swelling.  Gastrointestinal: Negative for nausea, vomiting, abdominal pain, diarrhea, constipation, blood in stool, abdominal distention and anal bleeding.  Genitourinary: Negative for dysuria, hematuria, flank pain and difficulty urinating.  Musculoskeletal: Negative for myalgias, back pain, joint swelling, arthralgias and gait problem.  Skin: Negative for color change, pallor, rash and wound.  Neurological: Negative for dizziness, tremors, weakness and light-headedness.  Hematological: Negative for adenopathy. Does not bruise/bleed easily.  Psychiatric/Behavioral: Negative for behavioral problems, confusion, sleep disturbance, dysphoric mood, decreased concentration and agitation.    Physical Exam   Ht 5' 1"  (1.549 m)   Wt 154 lb (69.9 kg)   SpO2 97%   BMI 29.10 kg/m   Physical Exam  Constitutional:  oriented to person, place, and time. appears well-developed and well-nourished. No distress.  HENT: [place]/AT, PERRLA, no scleral icterus Mouth/Throat: Oropharynx is clear and moist. No oropharyngeal exudate.  Cardiovascular: Normal rate, regular rhythm and normal heart sounds. Exam reveals no gallop and no friction rub.  No murmur heard.  Pulmonary/Chest: Effort normal  and breath sounds normal. No respiratory distress.  has no wheezes.  Neck = supple, no nuchal rigidity Abdominal: Soft. Bowel sounds are normal.  exhibits no distension. There is no tenderness.  Lymphadenopathy: no cervical adenopathy. No axillary adenopathy Neurological: alert and oriented to person, place, and time.  Skin: Skin is warm and dry. No rash noted. No erythema.  Psychiatric: a normal mood and affect.  behavior is normal.   Lab Results  Component Value Date   LABRPR Non Reactive  11/14/2018    CBC Lab Results  Component Value Date   WBC 6.8 03/28/2019   RBC 4.88 03/28/2019   HGB 14.5 03/28/2019   HCT 43.9 03/28/2019   PLT 277 03/28/2019   MCV 90 03/28/2019   MCH 29.7 03/28/2019   MCHC 33.0 03/28/2019   RDW 12.9 03/28/2019   LYMPHSABS 2.0 03/28/2019   MONOABS 1.3 (H) 10/16/2018   EOSABS 0.2 03/28/2019    BMET Lab Results  Component Value Date   NA 139 03/28/2019   K 4.1 03/28/2019   CL 100 03/28/2019   CO2 26 03/28/2019   GLUCOSE 95 03/28/2019   BUN 19 03/28/2019   CREATININE 1.01 (H) 03/28/2019   CALCIUM 9.4 03/28/2019   GFRNONAA 51 (L) 03/28/2019   GFRAA 58 (L) 03/28/2019      Assessment and Plan  Less abdominal pain, presently. Will refill oral vancomycin and make plans to do treatment course and arrange for zinplava infusion for recurrent cdifficile. Will need to see what her co-pay will be/insurance coverage.  Spent 30 min with patient

## 2019-04-18 NOTE — Patient Instructions (Signed)
If more questions arise:  [email or web address] or my chart

## 2019-04-23 ENCOUNTER — Telehealth: Payer: Self-pay | Admitting: Pharmacy Technician

## 2019-04-23 NOTE — Telephone Encounter (Signed)
[facility] Patient Advocate Encounter   Received notification from [facility] that prior authorization for Zinplava is required.   PA submitted on 04/23/2019 from one already launched Key [id number] Status is pending [phone] (company named [employer])    [facility] will continue to follow.  Bartholomew Crews, CPhT Specialty Pharmacy Patient [facility] for Infectious Disease Phone: [phone] Fax: [phone] 04/23/2019 12:19 PM

## 2019-04-24 ENCOUNTER — Other Ambulatory Visit: Payer: Self-pay | Admitting: Family Medicine

## 2019-04-24 NOTE — Telephone Encounter (Signed)
Prolia delivered today.  Labeled with pt's name and DOB.  Placed in refrigerator.

## 2019-04-24 NOTE — Telephone Encounter (Addendum)
[facility] Patient Advocate Encounter  Received notification from [employer] (formerly [employer]) that the request for prior authorization for Zinplava has been denied due to needing more information about administration of the medication.     This determination is currently being reprocessed. [employer] wanted to clarify that the medication would not be given in the home and rather at an infusion center. I provided them this information and will check back tomorrow to see if it has been approved. Called [phone]. Medication will be given in the hospital short stay, either [facility] or [facility].   This encounter will continue to be updated until final determination. I ran a test claim to provide the patient with drug cost and there is a potential it can be billed partB DME. She has signed up for that coverage as well on the new year plan.   Bartholomew Crews, CPhT Specialty Pharmacy Patient [facility] for Infectious Disease Phone: [phone] Fax: [phone] 04/24/2019 4:31 PM

## 2019-04-25 ENCOUNTER — Other Ambulatory Visit: Payer: Self-pay

## 2019-04-25 NOTE — Telephone Encounter (Signed)
Received fax from [place] for Colchicine refill for next fill. Last refill was sent in for 30 tablets, ok to switch to 90 and send?

## 2019-04-25 NOTE — Telephone Encounter (Signed)
[facility] Patient Advocate Encounter  Prior Authorization for Zinplava has been approved.    PA# [id number] Effective dates: 04/25/2019 through 04/25/2020  Called the patient and let her know that she was approved to receive the medication. Next steps will be getting the medication from advanced and an appointment at the infusion center at the hospital. Will pass this information on the the appropriate personnel.   Bartholomew Crews, CPhT Specialty Pharmacy Patient [facility] for Infectious Disease Phone: [phone] Fax: [phone] 04/25/2019 2:08 PM

## 2019-04-26 ENCOUNTER — Other Ambulatory Visit: Payer: Self-pay | Admitting: *Deleted

## 2019-04-26 DIAGNOSIS — A0472 Enterocolitis due to Clostridium difficile, not specified as recurrent: Secondary | ICD-10-CM

## 2019-04-26 MED ORDER — VANCOMYCIN HCL 125 MG PO CAPS: 125.0000 mg | ORAL_CAPSULE | Freq: Every day | ORAL | 0 refills | Status: DC

## 2019-04-26 NOTE — Progress Notes (Signed)
Continuing oral vancomycin until infusion of zinplava is set up. Landis Gandy, RN

## 2019-04-30 NOTE — Telephone Encounter (Signed)
RN spoke with patient, let her know that the Zinplava was approved, her infusion appointment is pending.  She took her last oral vancomycin capsule this morning.  The vancomycin refill is now too expensive, but she does have difficid at home (procured by her gastro team).  Per Dr Baxter Flattery, she will start Difficid twice daily and will continue this right up to the Zinplava infusion.  Will continue to follow.

## 2019-05-02 ENCOUNTER — Ambulatory Visit (INDEPENDENT_AMBULATORY_CARE_PROVIDER_SITE_OTHER): Payer: PPO

## 2019-05-02 DIAGNOSIS — M810 Age-related osteoporosis without current pathological fracture: Secondary | ICD-10-CM | POA: Diagnosis not present

## 2019-05-02 MED ORDER — DENOSUMAB 60 MG/ML ~~LOC~~ SOSY
60.0000 mg | PREFILLED_SYRINGE | Freq: Once | SUBCUTANEOUS | Status: AC
  Administered 2019-05-02: 15:00:00 60 mg via SUBCUTANEOUS

## 2019-05-02 MED ORDER — COLCHICINE 0.6 MG PO TABS: 0.6000 mg | ORAL_TABLET | Freq: Every day | ORAL | 0 refills | Status: DC | PRN

## 2019-05-02 NOTE — Progress Notes (Signed)
Per orders of Dr. Danise Mina, injection of Prolia-supplied by patient given by Kris Mouton. Patient tolerated injection well.

## 2019-05-02 NOTE — Telephone Encounter (Signed)
ERx. plz notify this should be taken as needed only, not daily.

## 2019-05-02 NOTE — Progress Notes (Signed)
Pt left v/m that she was out front of office for injection. Per this note pt has already received injection. nothing further needed.

## 2019-05-04 NOTE — Telephone Encounter (Signed)
[facility] Patient Advocate Encounter  Patient is able to fill their one time zinplava at [facility]. The copay is $90.00 which she is able to cover. [employer] [facility] is now open to help with copay cost. The online application for that directed me to a number [phone] but was unable to get anyone on the phone. Will try again in hopes that we can get the cost down to $0.  I informed her that Sharyn Lull will call on Monday 01/11 to go over logistics on 01/14 appointment of where to go and anything she needs to do beforehand. Everything related to the drug is handled and will be couriered over to [facility] inpatient to [place] the week of the infusion. Mariann Laster is aware of the anticipated timeline as well and will make sure the patient is not double billed.  Medication is anticipated to arrive Monday morning to the pharmacy. Patient is aware and will let her daughter know as well.

## 2019-05-04 NOTE — Telephone Encounter (Signed)
Left message on vm per dpr relaying Dr. G's message.  

## 2019-05-08 ENCOUNTER — Other Ambulatory Visit: Payer: Self-pay | Admitting: Internal Medicine

## 2019-05-08 ENCOUNTER — Encounter: Payer: Self-pay | Admitting: *Deleted

## 2019-05-08 MED ORDER — ZINPLAVA 1000 MG/40ML IV SOLN: 700.0000 mg | Freq: Once | INTRAVENOUS | 0 refills | Status: AC

## 2019-05-08 MED FILL — ZINPLAVA 1000 MG/40ML SOLN: 1000 | 1 days supply | Qty: 40 | Fill #0

## 2019-05-10 ENCOUNTER — Ambulatory Visit (HOSPITAL_COMMUNITY)
Admission: RE | Admit: 2019-05-10 | Discharge: 2019-05-10 | Disposition: A | Discharge status: Home / Self Care | Payer: PPO | Source: Ambulatory Visit | Attending: Internal Medicine | Admitting: Internal Medicine

## 2019-05-10 ENCOUNTER — Other Ambulatory Visit: Payer: Self-pay

## 2019-05-10 DIAGNOSIS — A0471 Enterocolitis due to Clostridium difficile, recurrent: Secondary | ICD-10-CM | POA: Diagnosis not present

## 2019-05-10 MED ORDER — SODIUM CHLORIDE 0.9 % IV SOLN
10.0000 mg/kg | Freq: Once | INTRAVENOUS | Status: DC
  Filled 2019-05-10: qty 28.12

## 2019-05-10 MED ORDER — SODIUM CHLORIDE 0.9 % IV SOLN
10.0000 mg/kg | Freq: Once | INTRAVENOUS | Status: AC
  Administered 2019-05-10: 703 mg via INTRAVENOUS
  Filled 2019-05-10: qty 28.12

## 2019-05-15 MED FILL — Bezlotoxumab IV Soln 1000 MG/40ML (25 MG/ML): INTRAVENOUS | Qty: 40 | Status: AC

## 2019-05-16 ENCOUNTER — Other Ambulatory Visit: Payer: Self-pay

## 2019-05-16 ENCOUNTER — Encounter: Payer: Self-pay | Admitting: Internal Medicine

## 2019-05-16 ENCOUNTER — Ambulatory Visit (INDEPENDENT_AMBULATORY_CARE_PROVIDER_SITE_OTHER): Payer: PPO | Admitting: Internal Medicine

## 2019-05-16 VITALS — BP 126/78 | HR 71 | Temp 98.2°F | Wt 156.4 lb

## 2019-05-16 DIAGNOSIS — A0472 Enterocolitis due to Clostridium difficile, not specified as recurrent: Secondary | ICD-10-CM

## 2019-05-16 NOTE — Progress Notes (Signed)
Patient ID: Brooke Beasley, female   DOB: 1930-11-12, 86 y.o.   MRN: [id number]  HPI 86yo F with recurrent cdifficile received zinplava- 1/14 had a little bit of nausea and fatigue  No cdifficile medicine  No noticing any change in stool output  Yesterday had only 2 BM but a few days back had several BM  Outpatient Encounter Medications as of 05/16/2019  Medication Sig  . acetaminophen (TYLENOL) 500 MG tablet Take 500-1,000 mg by mouth at bedtime. Depends on pain  . albuterol (PROVENTIL HFA;VENTOLIN HFA) 108 (90 Base) MCG/ACT inhaler Inhale 2 puffs into the lungs every 6 (six) hours as needed for wheezing or shortness of breath.  Marland Kitchen amLODipine (NORVASC) 10 MG tablet Take 1 tablet by mouth once daily  . bimatoprost (LUMIGAN) 0.01 % SOLN Place 1 drop into both eyes at bedtime.  Marland Kitchen Bioflavonoid Products (VITAMIN C) CHEW Chew 1 tablet by mouth daily.  . cholecalciferol (VITAMIN D) 1000 units tablet Take 1,000 Units by mouth daily.  . clonazePAM (KLONOPIN) 0.5 MG tablet TAKE 1 TABLET BY MOUTH AT BEDTIME AS NEEDED FOR ANXIETY  . colchicine 0.6 MG tablet Take 1 tablet (0.6 mg total) by mouth daily as needed (gout flare).  . fish oil-omega-3 fatty acids 1000 MG capsule Take 1 g by mouth daily.   . furosemide (LASIX) 20 MG tablet TAKE 1 TABLET BY MOUTH ONCE DAILY AS NEEDED FOR EDEMA  . hydrocortisone (ANUSOL-HC) 2.5 % rectal cream Place 1 application rectally 2 (two) times daily.  Marland Kitchen levothyroxine (SYNTHROID) 100 MCG tablet Take 1 tablet by mouth once daily  . Misc Natural Products (OSTEO BI-FLEX TRIPLE STRENGTH PO) Take 1 tablet by mouth daily. With Vit D  . omeprazole (PRILOSEC) 20 MG capsule Take 1 capsule by mouth daily  . potassium chloride (K-DUR) 10 MEQ tablet TAKE 1 TABLET BY MOUTH ONCE DAILY AS NEEDED (WITH  LASIX  (FUROSEMIDE))  . predniSONE (STERAPRED UNI-PAK 21 TAB) 10 MG (21) TBPK tablet TAKE BY MOUTH AS DIRECTED ON INSIDE OF PACKAGE  . sertraline (ZOLOFT) 100 MG tablet  Take 1 tablet by mouth daily  . SUPREP BOWEL PREP KIT 17.5-3.13-1.6 GM/177ML SOLN See admin instructions.  . TURMERIC PO Take 1 tablet by mouth daily.  Marland Kitchen VITAMIN E PO Take 1 tablet by mouth daily.  . VOLTAREN 1 % GEL APPLY ONE APPLICATION TOPICALLY 3 TIMES DAILY (Patient taking differently: APPLY ONE APPLICATION TOPICALLY daily as needed for shoulder pain)  . azithromycin (ZITHROMAX) 500 MG tablet Take 1 tablet (500 mg total) by mouth daily. (Patient not taking: Reported on 05/16/2019)  . denosumab (PROLIA) 60 MG/ML SOSY injection Inject 60 mg into the skin every 6 (six) months. (Patient not taking: Reported on 04/18/2019)  . HYDROcodone-acetaminophen (NORCO/VICODIN) 5-325 MG tablet Take 1-2 tablets by mouth every 6 (six) hours as needed. (Patient not taking: Reported on 04/18/2019)  . methocarbamol (ROBAXIN) 500 MG tablet Take 1 tablet (500 mg total) by mouth every 8 (eight) hours as needed for muscle spasms. (Patient not taking: Reported on 04/18/2019)  . vancomycin (VANCOCIN) 125 MG capsule Take 1 capsule (125 mg total) by mouth daily. (Patient not taking: Reported on 05/16/2019)   No facility-administered encounter medications on file as of 05/16/2019.     Patient Active Problem List   Diagnosis Date Noted  . Cough 07/11/2018  . C. difficile diarrhea 07/11/2018  . Chest congestion 02/21/2018  . Pedal edema 12/29/2016  . BRBPR (bright red blood per rectum) 04/09/2016  .  Weakness 04/09/2016  . Complete tear of left rotator cuff 02/13/2016  . Bowel habit changes 01/13/2016  . Hemorrhoids, external 01/13/2016  . Thumb pain, left 12/22/2015  . Status post total shoulder replacement 11/27/2015  . Health maintenance examination 10/07/2015  . Pseudogout   . Primary osteoarthritis of right shoulder 11/21/2014  . Left shoulder pain 11/04/2014  . Carotid stenosis 10/18/2014  . Advanced care planning/counseling discussion 09/26/2014  . Diverticulosis of colon without hemorrhage 09/26/2014  .  CKD (chronic kidney disease) stage 3, GFR 30-59 ml/min 08/02/2014  . Restless legs 07/04/2014  . Dizziness 06/13/2014  . Chronic abdominal pain 01/07/2014  . Primary osteoarthritis of both hands 11/21/2013  . Insomnia 10/23/2013  . Medicare annual wellness visit, subsequent 06/08/2013  . GERD (gastroesophageal reflux disease) 04/02/2013  . Anxiety associated with depression 12/26/2012  . Prediabetes 03/20/2012  . Heart murmur 09/13/2011  . Syncope 08/02/2011  . HYPERTENSION, BENIGN 02/24/2007  . Hypothyroidism 11/02/2006  . HLD (hyperlipidemia) 11/02/2006  . Glaucoma 11/02/2006  . Internal hemorrhoids 11/02/2006  . Chronic constipation 11/02/2006  . Irritable bowel syndrome with constipation 11/02/2006  . Osteoporosis 11/02/2006  . Osteoarthritis 10/20/2006     Health Maintenance Due  Topic Date Due  . DTAP VACCINES (1) 01/21/1933  . MAMMOGRAM  09/01/2017  . DTaP/Tdap/Td (3 - Tdap) 10/23/2018  . TETANUS/TDAP  10/23/2018     Review of Systems  Physical Exam   BP 126/78   Pulse 71   Temp 98.2 F (36.8 C) (Oral)   Wt 156 lb 6.4 oz (70.9 kg)   BMI 29.55 kg/m    No results found for: CD4TCELL No results found for: CD4TABS No results found for: HIV1RNAQUANT No results found for: HEPBSAB Lab Results  Component Value Date   LABRPR Non Reactive 11/14/2018    CBC Lab Results  Component Value Date   WBC 6.8 03/28/2019   RBC 4.88 03/28/2019   HGB 14.5 03/28/2019   HCT 43.9 03/28/2019   PLT 277 03/28/2019   MCV 90 03/28/2019   MCH 29.7 03/28/2019   MCHC 33.0 03/28/2019   RDW 12.9 03/28/2019   LYMPHSABS 2.0 03/28/2019   MONOABS 1.3 (H) 10/16/2018   EOSABS 0.2 03/28/2019    BMET Lab Results  Component Value Date   NA 139 03/28/2019   K 4.1 03/28/2019   CL 100 03/28/2019   CO2 26 03/28/2019   GLUCOSE 95 03/28/2019   BUN 19 03/28/2019   CREATININE 1.01 (H) 03/28/2019   CALCIUM 9.4 03/28/2019   GFRNONAA 51 (L) 03/28/2019   GFRAA 58 (L) 03/28/2019       Assessment and Plan

## 2019-05-16 NOTE — Patient Instructions (Signed)
COVID-19 Vaccine Information can be found at: [email or web address] For questions related to vaccine distribution or appointments, please email [email or web address] or call [phone].    

## 2019-05-19 ENCOUNTER — Other Ambulatory Visit: Payer: Self-pay | Admitting: Family Medicine

## 2019-05-21 ENCOUNTER — Ambulatory Visit (INDEPENDENT_AMBULATORY_CARE_PROVIDER_SITE_OTHER): Payer: PPO | Admitting: Family Medicine

## 2019-05-21 ENCOUNTER — Ambulatory Visit (INDEPENDENT_AMBULATORY_CARE_PROVIDER_SITE_OTHER)
Admission: RE | Admit: 2019-05-21 | Discharge: 2019-05-21 | Disposition: A | Discharge status: Home / Self Care | Payer: PPO | Source: Ambulatory Visit | Attending: Family Medicine | Admitting: Family Medicine

## 2019-05-21 ENCOUNTER — Other Ambulatory Visit: Payer: Self-pay

## 2019-05-21 ENCOUNTER — Encounter: Payer: Self-pay | Admitting: Family Medicine

## 2019-05-21 VITALS — BP 124/66 | HR 86 | Temp 97.6°F | Ht 61.0 in | Wt 157.3 lb

## 2019-05-21 DIAGNOSIS — S8011XA Contusion of right lower leg, initial encounter: Secondary | ICD-10-CM | POA: Diagnosis not present

## 2019-05-21 DIAGNOSIS — S8001XA Contusion of right knee, initial encounter: Secondary | ICD-10-CM | POA: Diagnosis not present

## 2019-05-21 DIAGNOSIS — M7989 Other specified soft tissue disorders: Secondary | ICD-10-CM | POA: Diagnosis not present

## 2019-05-21 DIAGNOSIS — S8991XA Unspecified injury of right lower leg, initial encounter: Secondary | ICD-10-CM | POA: Diagnosis not present

## 2019-05-21 NOTE — Patient Instructions (Signed)
xrays looking ok today. If any change in plan based on radiology read, we will call you. I think you had bad bony bruise of kneecap and lower leg.  Continue tylenol for pain, elevation of leg, ice and heat (covered in a towel).  Let us know if not improving each day.

## 2019-05-21 NOTE — Progress Notes (Addendum)
This visit was conducted in person.  BP 124/66 (BP Location: Left Arm, Patient Position: Sitting, Cuff Size: Normal)   Pulse 86   Temp 97.6 F (36.4 C) (Temporal)   Ht 5' 1"  (1.549 m)   Wt 157 lb 5 oz (71.4 kg)   SpO2 98%   BMI 29.72 kg/m    CC: R knee injury Subjective:    Patient ID: Brooke Beasley, female    DOB: [birth year], 86 y.o.   MRN: [id number]  HPI: Brooke Beasley is a 86 y.o. female presenting on 05/21/2019 for Joint Swelling (C/o right knee swelling.  Also, c/o some stiffness and redness in the knee. Fell at home on 05/03/19.  Also)   DOI: 05/03/2019  Tripped at home, landed directly onto R knee and leg. Painful swelling since then. Initial extensive bruising with pain from knee down to ankle.  No locking or instability of knee.  Treating with ice and heat, tylenol and few aleve.      Relevant past medical, surgical, family and social history reviewed and updated as indicated. Interim medical history since our last visit reviewed. Allergies and medications reviewed and updated. Outpatient Medications Prior to Visit  Medication Sig Dispense Refill  . acetaminophen (TYLENOL) 500 MG tablet Take 500-1,000 mg by mouth at bedtime. Depends on pain    . albuterol (PROVENTIL HFA;VENTOLIN HFA) 108 (90 Base) MCG/ACT inhaler Inhale 2 puffs into the lungs every 6 (six) hours as needed for wheezing or shortness of breath. 1 Inhaler 0  . amLODipine (NORVASC) 10 MG tablet Take 1 tablet by mouth once daily 90 tablet 3  . bimatoprost (LUMIGAN) 0.01 % SOLN Place 1 drop into both eyes at bedtime.    Marland Kitchen Bioflavonoid Products (VITAMIN C) CHEW Chew 1 tablet by mouth daily.    . cholecalciferol (VITAMIN D) 1000 units tablet Take 1,000 Units by mouth daily.    . clonazePAM (KLONOPIN) 0.5 MG tablet TAKE 1 TABLET BY MOUTH AT BEDTIME AS NEEDED FOR ANXIETY 30 tablet 3  . colchicine 0.6 MG tablet Take 1 tablet (0.6 mg total) by mouth daily as needed (gout flare). 90 tablet 0  .  denosumab (PROLIA) 60 MG/ML SOSY injection Inject 60 mg into the skin every 6 (six) months. 1 mL 1  . fish oil-omega-3 fatty acids 1000 MG capsule Take 1 g by mouth daily.     . furosemide (LASIX) 20 MG tablet TAKE 1 TABLET BY MOUTH ONCE DAILY AS NEEDED FOR EDEMA 90 tablet 0  . HYDROcodone-acetaminophen (NORCO/VICODIN) 5-325 MG tablet Take 1-2 tablets by mouth every 6 (six) hours as needed. 6 tablet 0  . hydrocortisone (ANUSOL-HC) 2.5 % rectal cream Place 1 application rectally 2 (two) times daily. 30 g 0  . levothyroxine (SYNTHROID) 100 MCG tablet Take 1 tablet by mouth once daily 90 tablet 1  . methocarbamol (ROBAXIN) 500 MG tablet Take 1 tablet (500 mg total) by mouth every 8 (eight) hours as needed for muscle spasms. 8 tablet 0  . Misc Natural Products (OSTEO BI-FLEX TRIPLE STRENGTH PO) Take 1 tablet by mouth daily. With Vit D    . omeprazole (PRILOSEC) 20 MG capsule Take 1 capsule by mouth daily 90 capsule 2  . potassium chloride (K-DUR) 10 MEQ tablet TAKE 1 TABLET BY MOUTH ONCE DAILY AS NEEDED (WITH  LASIX  (FUROSEMIDE)) 90 tablet 0  . sertraline (ZOLOFT) 100 MG tablet Take 1 tablet by mouth daily 90 tablet 2  . TURMERIC PO Take 1 tablet by  mouth daily.    Marland Kitchen VITAMIN E PO Take 1 tablet by mouth daily.    . VOLTAREN 1 % GEL APPLY ONE APPLICATION TOPICALLY 3 TIMES DAILY (Patient taking differently: APPLY ONE APPLICATION TOPICALLY daily as needed for shoulder pain) 100 g 3  . azithromycin (ZITHROMAX) 500 MG tablet Take 1 tablet (500 mg total) by mouth daily. 3 tablet 0  . predniSONE (STERAPRED UNI-PAK 21 TAB) 10 MG (21) TBPK tablet TAKE BY MOUTH AS DIRECTED ON INSIDE OF PACKAGE    . SUPREP BOWEL PREP KIT 17.5-3.13-1.6 GM/177ML SOLN See admin instructions.    . vancomycin (VANCOCIN) 125 MG capsule Take 1 capsule (125 mg total) by mouth daily. 30 capsule 0   No facility-administered medications prior to visit.     Per HPI unless specifically indicated in ROS section below Review of  Systems Objective:    BP 124/66 (BP Location: Left Arm, Patient Position: Sitting, Cuff Size: Normal)   Pulse 86   Temp 97.6 F (36.4 C) (Temporal)   Ht 5' 1"  (1.549 m)   Wt 157 lb 5 oz (71.4 kg)   SpO2 98%   BMI 29.72 kg/m   Wt Readings from Last 3 Encounters:  05/21/19 157 lb 5 oz (71.4 kg)  05/16/19 156 lb 6.4 oz (70.9 kg)  05/10/19 155 lb (70.3 kg)    Physical Exam Vitals and nursing note reviewed.  Constitutional:      Appearance: Normal appearance.  Musculoskeletal:        General: Swelling, tenderness and signs of injury present. No deformity.     Comments:  Marked swelling and bruising at anterior knee over R patella into anterior shin.  L knee WNL R knee exam: No deformity on inspection. Discomfort with palpation of anteriomedial knee and anterior shin. No pain at pes anserine busa + swelling noted anterior knee and at suprapatellar bursa, tender to palpation.  FROM in flex/extension without crepitus. No popliteal fullness. Neg drawer test. Neg mcmurray test. No pain with valgus/varus stress. No PFgrind. No abnormal patellar mobility.   Skin:    Findings: Bruising present.  Neurological:     Mental Status: She is alert.  Psychiatric:        Mood and Affect: Mood normal.        Behavior: Behavior normal.       DG Tibia/Fibula Right CLINICAL DATA:  Fall 3 weeks ago anterior knee pain swelling and bruising  EXAM: RIGHT TIBIA AND FIBULA - 2 VIEW  COMPARISON:  Knee film of the same date  FINDINGS: Prepatellar and pretibial soft tissue swelling. No signs of fracture. Extensive vascular calcification soft tissues. Distal aspect of the fibula is not imaged.  IMPRESSION: Prepatellar and pretibial soft tissue swelling. No signs of fracture. Distal fibula but not imaged on lateral view.  Electronically Signed   By: Zetta Bills M.D.   On: 05/21/2019 16:18 DG Knee Complete 4 Views Right CLINICAL DATA:  Fall with anterior lower leg bruising, 3 weeks  ago.  EXAM: RIGHT KNEE - COMPLETE 4+ VIEW  COMPARISON:  Tibia and fibula reported separately.  FINDINGS: Extensive prepatellar and anterolateral soft tissue swelling over the right knee tracking along the anterior tibia. No signs of acute fracture.  IMPRESSION: Extensive prepatellar and anterolateral soft tissue swelling over the right knee tracking along the anterior tibia.  Electronically Signed   By: Zetta Bills M.D.   On: 05/21/2019 16:16   Assessment & Plan:  This visit occurred during the SARS-CoV-2 public health  emergency.  Safety protocols were in place, including screening questions prior to the visit, additional usage of staff PPE, and extensive cleaning of exam room while observing appropriate contact time as indicated for disinfecting solutions.   Problem List Items Addressed This Visit    Injury of right knee - Primary    Concern for patellar injury given marked bruising still present 3 wks after initial injury - check xrays. Clear on my read. Anticipate combination of bony contusion and suprapatellar bursitis, not consistent with infection. Supportive care reviewed - discussed continued tylenol, ice, heat. Consider voltaren gel. Update if not improving as expected.       Relevant Orders   DG Knee Complete 4 Views Right (Completed)   DG Tibia/Fibula Right (Completed)       No orders of the defined types were placed in this encounter.  Orders Placed This Encounter  Procedures  . DG Knee Complete 4 Views Right    Standing Status:   Future    Number of Occurrences:   1    Standing Expiration Date:   07/18/2020    Order Specific Question:   Reason for Exam (SYMPTOM  OR DIAGNOSIS REQUIRED)    Answer:   fall 3 wks ago with residual anterior knee swelling and bruising    Order Specific Question:   Preferred imaging location?    Answer:   [facility]    Order Specific Question:   Radiology Contrast Protocol - do NOT remove file path    Answer:    \\[email or web address]\DXFluoroContrastProtocols.[email or web address]  . DG Tibia/Fibula Right    Standing Status:   Future    Number of Occurrences:   1    Standing Expiration Date:   07/18/2020    Order Specific Question:   Reason for Exam (SYMPTOM  OR DIAGNOSIS REQUIRED)    Answer:   3 wks ago fall with anterior lower leg pain/brusiging    Order Specific Question:   Preferred imaging location?    Answer:   [facility]    Order Specific Question:   Radiology Contrast Protocol - do NOT remove file path    Answer:   \\[email or web address]\[email or web address]\DXFluoroContrastProtocols.pdf    Patient Instructions  xrays looking ok today. If any change in plan based on radiology read, we will call you. I think you had bad bony bruise of kneecap and lower leg.  Continue tylenol for pain, elevation of leg, ice and heat (covered in a towel).  Let us know if not improving each day.    Follow up plan: Return if symptoms worsen or fail to improve.  [name], MD

## 2019-05-22 DIAGNOSIS — S8991XA Unspecified injury of right lower leg, initial encounter: Secondary | ICD-10-CM | POA: Insufficient documentation

## 2019-05-22 NOTE — Addendum Note (Signed)
Addended by: [name] on: 05/22/2019 07:03 AM   Modules accepted: Level of Service

## 2019-05-22 NOTE — Assessment & Plan Note (Addendum)
Concern for patellar injury given marked bruising still present 3 wks after initial injury - check xrays. Clear on my read. Anticipate combination of bony contusion and suprapatellar bursitis, not consistent with infection. Supportive care reviewed - discussed continued tylenol, ice, heat. Consider voltaren gel. Update if not improving as expected.

## 2019-06-04 ENCOUNTER — Other Ambulatory Visit: Payer: Self-pay | Admitting: Family Medicine

## 2019-06-16 ENCOUNTER — Other Ambulatory Visit: Payer: Self-pay | Admitting: Family Medicine

## 2019-07-09 ENCOUNTER — Ambulatory Visit (INDEPENDENT_AMBULATORY_CARE_PROVIDER_SITE_OTHER): Payer: PPO | Admitting: Family Medicine

## 2019-07-09 ENCOUNTER — Encounter: Payer: Self-pay | Admitting: Family Medicine

## 2019-07-09 ENCOUNTER — Other Ambulatory Visit: Payer: Self-pay

## 2019-07-09 VITALS — BP 160/72 | HR 76 | Temp 97.6°F | Ht 61.0 in | Wt 160.1 lb

## 2019-07-09 DIAGNOSIS — G8929 Other chronic pain: Secondary | ICD-10-CM | POA: Diagnosis not present

## 2019-07-09 DIAGNOSIS — E039 Hypothyroidism, unspecified: Secondary | ICD-10-CM

## 2019-07-09 DIAGNOSIS — N183 Chronic kidney disease, stage 3 unspecified: Secondary | ICD-10-CM | POA: Diagnosis not present

## 2019-07-09 DIAGNOSIS — M810 Age-related osteoporosis without current pathological fracture: Secondary | ICD-10-CM | POA: Diagnosis not present

## 2019-07-09 DIAGNOSIS — I10 Essential (primary) hypertension: Secondary | ICD-10-CM

## 2019-07-09 DIAGNOSIS — R109 Unspecified abdominal pain: Secondary | ICD-10-CM

## 2019-07-09 DIAGNOSIS — K5730 Diverticulosis of large intestine without perforation or abscess without bleeding: Secondary | ICD-10-CM

## 2019-07-09 DIAGNOSIS — R011 Cardiac murmur, unspecified: Secondary | ICD-10-CM | POA: Diagnosis not present

## 2019-07-09 DIAGNOSIS — I499 Cardiac arrhythmia, unspecified: Secondary | ICD-10-CM | POA: Diagnosis not present

## 2019-07-09 DIAGNOSIS — A0472 Enterocolitis due to Clostridium difficile, not specified as recurrent: Secondary | ICD-10-CM

## 2019-07-09 DIAGNOSIS — I491 Atrial premature depolarization: Secondary | ICD-10-CM

## 2019-07-09 DIAGNOSIS — K581 Irritable bowel syndrome with constipation: Secondary | ICD-10-CM

## 2019-07-09 MED ORDER — METOPROLOL SUCCINATE ER 25 MG PO TB24: 25.0000 mg | ORAL_TABLET | Freq: Every day | ORAL | 6 refills | Status: DC

## 2019-07-09 NOTE — Assessment & Plan Note (Signed)
H/o recurrent C diff infection.  Has had multiple treatments with oral vanc (course and tapers) and latest completed Zinplava monoclonal Ab treatment (05/10/2019), no further antibiotics since then. Fecal transplant was placed on hold due to covid pandemic.  Awaiting clearance of C diff prior to rectal prolapse surgery - will send testing today and if negative, consider return to Dr Marcello Moores for further evaluation.

## 2019-07-09 NOTE — Assessment & Plan Note (Signed)
Severe. First prolia received 04/2019

## 2019-07-09 NOTE — Assessment & Plan Note (Signed)
Update labs.  

## 2019-07-09 NOTE — Patient Instructions (Addendum)
We will send off C diff testing.  EKG today -extra heart beats noted. Make sure you stay well hydrated Labs today.  Blood pressure was too high - start checking at home and if consistently elevated, start Toprol XL 25mg  once daily.  Return in 4 months for physical/wellness visit.

## 2019-07-09 NOTE — Assessment & Plan Note (Signed)
Elevated BP today - possibly from malaise after large BM this morning - advised to start monitoring blood pressures more closely at home, WASP for toprol XL 25mg  printed with indications when to fill.

## 2019-07-09 NOTE — Assessment & Plan Note (Signed)
Sinus arrhythmia with frequent PACs on EKG today - check labwork for etiology. Encouraged increased water intake to ensure good hydration status. Consider trial Toprol XL pending home BP readings.

## 2019-07-09 NOTE — Assessment & Plan Note (Signed)
They tell me levothyroxine formulary changed last month - I don't see where pharmacy notified me. Will check TSH, fT4 today. This could contribute to arrhythmia noted today.

## 2019-07-09 NOTE — Progress Notes (Signed)
This visit was conducted in person.  BP (!) 160/72 (BP Location: Left Arm, Patient Position: Sitting, Cuff Size: Normal)   Pulse 76   Temp 97.6 F (36.4 C) (Temporal)   Ht 5\' 1"  (1.549 m)   Wt 160 lb 2 oz (72.6 kg)   SpO2 98%   BMI 30.26 kg/m   BP elevated on retesting.   CC: 6 wk f/u visit  Subjective:    Patient ID: Brooke Beasley, female    DOB: 05-Dec-1930, 86 y.o.   MRN: [id number]  HPI: Brooke Beasley is a 86 y.o. female presenting on 07/09/2019 for Follow-up (Here for 4-6 wk f/u.  Pt accompanied by daughter, [name]- temp 97.9.)   Some weight gain noted. Appetite ok.   Had large loose BM today after 2 days of small hard stools (constipation). She is wore out. Brought stool sample today (pasty stool). She is worried she has ongoing infection. Large amt of mucous with stools. No blood.   Has seen GI and ID for recurrent C diff infection s/p zinplava 05/10/2019 (Bezlotoxumab monoclonal antibody treatment). Fecal transplant was held due to pandemic. She failed daily oral vanc. Planning surgery for rectal prolapse - this has been delayed due to Cdiff. Rectal prolapse is getting worse Marcello Moores).   Osteoporosis - intolerant to oral bisphosphonates and calcium. Received prolia 05/02/2019.   HTN - Compliant with current antihypertensive regimen of amlodipine 10mg  daily, lasix 20mg  daily with potassium. Does not check blood pressures at home - but can start. No low blood pressure readings or symptoms of syncope. Stays winded. Some dizziness. Denies HA, vision changes, CP/tightness, leg swelling.    By the way - levothyroxine formulary changed last month - I don't see where I was contacted to approve formulary change.      Relevant past medical, surgical, family and social history reviewed and updated as indicated. Interim medical history since our last visit reviewed. Allergies and medications reviewed and updated. Outpatient Medications Prior to Visit  Medication Sig  Dispense Refill  . acetaminophen (TYLENOL) 500 MG tablet Take 500-1,000 mg by mouth at bedtime. Depends on pain    . albuterol (PROVENTIL HFA;VENTOLIN HFA) 108 (90 Base) MCG/ACT inhaler Inhale 2 puffs into the lungs every 6 (six) hours as needed for wheezing or shortness of breath. 1 Inhaler 0  . amLODipine (NORVASC) 10 MG tablet Take 1 tablet by mouth once daily 90 tablet 3  . bimatoprost (LUMIGAN) 0.01 % SOLN Place 1 drop into both eyes at bedtime.    Marland Kitchen Bioflavonoid Products (VITAMIN C) CHEW Chew 1 tablet by mouth daily.    . cholecalciferol (VITAMIN D) 1000 units tablet Take 1,000 Units by mouth daily.    . clonazePAM (KLONOPIN) 0.5 MG tablet TAKE 1 TABLET BY MOUTH AT BEDTIME AS NEEDED FOR ANXIETY 30 tablet 3  . colchicine 0.6 MG tablet Take 1 tablet (0.6 mg total) by mouth daily as needed (gout flare). 90 tablet 0  . denosumab (PROLIA) 60 MG/ML SOSY injection Inject 60 mg into the skin every 6 (six) months. 1 mL 1  . EUTHYROX 100 MCG tablet Take 1 tablet by mouth once daily 90 tablet 1  . fish oil-omega-3 fatty acids 1000 MG capsule Take 1 g by mouth daily.     . furosemide (LASIX) 20 MG tablet TAKE 1 TABLET BY MOUTH ONCE DAILY AS NEEDED FOR EDEMA 90 tablet 1  . HYDROcodone-acetaminophen (NORCO/VICODIN) 5-325 MG tablet Take 1-2 tablets by mouth every 6 (six) hours as  needed. 6 tablet 0  . hydrocortisone (ANUSOL-HC) 2.5 % rectal cream Place 1 application rectally 2 (two) times daily. 30 g 0  . methocarbamol (ROBAXIN) 500 MG tablet Take 1 tablet (500 mg total) by mouth every 8 (eight) hours as needed for muscle spasms. 8 tablet 0  . Misc Natural Products (OSTEO BI-FLEX TRIPLE STRENGTH PO) Take 1 tablet by mouth daily. With Vit D    . omeprazole (PRILOSEC) 20 MG capsule Take 1 capsule by mouth daily 90 capsule 2  . potassium chloride (KLOR-CON) 10 MEQ tablet TAKE 1 TABLET BY MOUTH ONCE DAILY AS NEEDED (WITH  LASIX  (FUROSEMIDE)) 90 tablet 0  . sertraline (ZOLOFT) 100 MG tablet Take 1 tablet by  mouth daily 90 tablet 2  . TURMERIC PO Take 1 tablet by mouth daily.    Marland Kitchen VITAMIN E PO Take 1 tablet by mouth daily.    . VOLTAREN 1 % GEL APPLY ONE APPLICATION TOPICALLY 3 TIMES DAILY (Patient taking differently: APPLY ONE APPLICATION TOPICALLY daily as needed for shoulder pain) 100 g 3   No facility-administered medications prior to visit.     Per HPI unless specifically indicated in ROS section below Review of Systems Objective:    BP (!) 160/72 (BP Location: Left Arm, Patient Position: Sitting, Cuff Size: Normal)   Pulse 76   Temp 97.6 F (36.4 C) (Temporal)   Ht 5\' 1"  (1.549 m)   Wt 160 lb 2 oz (72.6 kg)   SpO2 98%   BMI 30.26 kg/m   Wt Readings from Last 3 Encounters:  07/09/19 160 lb 2 oz (72.6 kg)  05/21/19 157 lb 5 oz (71.4 kg)  05/16/19 156 lb 6.4 oz (70.9 kg)    Physical Exam Vitals and nursing note reviewed.  Constitutional:      Appearance: Normal appearance. She is not ill-appearing.  HENT:     Head: Normocephalic and atraumatic.  Eyes:     Extraocular Movements: Extraocular movements intact.     Pupils: Pupils are equal, round, and reactive to light.  Cardiovascular:     Rate and Rhythm: Normal rate. Rhythm irregular.     Pulses: Normal pulses.     Heart sounds: Murmur (3/6 systolic) present.  Pulmonary:     Effort: Pulmonary effort is normal. No respiratory distress.     Breath sounds: Normal breath sounds. No wheezing, rhonchi or rales.  Abdominal:     General: Abdomen is flat. Bowel sounds are normal. There is no distension.     Palpations: Abdomen is soft. There is no mass.     Tenderness: There is no abdominal tenderness. There is no right CVA tenderness, left CVA tenderness, guarding or rebound.     Hernia: No hernia is present.  Musculoskeletal:     Right lower leg: No edema.     Left lower leg: No edema.  Neurological:     Mental Status: She is alert.  Psychiatric:        Mood and Affect: Mood normal.        Behavior: Behavior normal.        Lab Results  Component Value Date   CREATININE 1.01 (H) 03/28/2019   BUN 19 03/28/2019   NA 139 03/28/2019   K 4.1 03/28/2019   CL 100 03/28/2019   CO2 26 03/28/2019    Lab Results  Component Value Date   ALT 15 03/28/2019   AST 20 03/28/2019   ALKPHOS 117 03/28/2019   BILITOT 0.3 03/28/2019  Lab Results  Component Value Date   WBC 6.8 03/28/2019   HGB 14.5 03/28/2019   HCT 43.9 03/28/2019   MCV 90 03/28/2019   PLT 277 03/28/2019   Lab Results  Component Value Date   TSH 2.45 10/16/2018   EKG - sinus arrhythmia rate 90s, normal axis, frequent PACs, no acute ST/T changes Assessment & Plan:  This visit occurred during the SARS-CoV-2 public health emergency.  Safety protocols were in place, including screening questions prior to the visit, additional usage of staff PPE, and extensive cleaning of exam room while observing appropriate contact time as indicated for disinfecting solutions.   Problem List Items Addressed This Visit    PAC (premature atrial contraction)    Sinus arrhythmia with frequent PACs on EKG today - check labwork for etiology. Encouraged increased water intake to ensure good hydration status. Consider trial Toprol XL pending home BP readings.       Relevant Medications   metoprolol succinate (TOPROL-XL) 25 MG 24 hr tablet   Osteoporosis    Severe. First prolia received 04/2019      Irritable bowel syndrome with constipation   Hypothyroidism    They tell me levothyroxine formulary changed last month - I don't see where pharmacy notified me. Will check TSH, fT4 today. This could contribute to arrhythmia noted today.       Relevant Medications   metoprolol succinate (TOPROL-XL) 25 MG 24 hr tablet   HYPERTENSION, BENIGN    Elevated BP today - possibly from malaise after large BM this morning - advised to start monitoring blood pressures more closely at home, WASP for toprol XL 25mg  printed with indications when to fill.       Relevant  Medications   metoprolol succinate (TOPROL-XL) 25 MG 24 hr tablet   Heart murmur   Diverticulosis of colon without hemorrhage   CKD (chronic kidney disease) stage 3, GFR 30-59 ml/min    Update labs.       Chronic abdominal pain   C. difficile diarrhea - Primary    H/o recurrent C diff infection.  Has had multiple treatments with oral vanc (course and tapers) and latest completed Zinplava monoclonal Ab treatment (05/10/2019), no further antibiotics since then. Fecal transplant was placed on hold due to covid pandemic.  Awaiting clearance of C diff prior to rectal prolapse surgery - will send testing today and if negative, consider return to Dr Marcello Moores for further evaluation.       Relevant Orders   C. difficile GDH and Toxin A/B    Other Visit Diagnoses    Irregular heart beat       Relevant Orders   EKG 12-Lead (Completed)   Comprehensive metabolic panel   TSH   CBC with Differential/Platelet   T4, free       Meds ordered this encounter  Medications  . metoprolol succinate (TOPROL-XL) 25 MG 24 hr tablet    Sig: Take 1 tablet (25 mg total) by mouth daily.    Dispense:  30 tablet    Refill:  6   Orders Placed This Encounter  Procedures  . C. difficile GDH and Toxin A/B    Standing Status:   Future    Number of Occurrences:   1    Standing Expiration Date:   07/08/2020  . Comprehensive metabolic panel  . TSH  . CBC with Differential/Platelet  . T4, free  . EKG 12-Lead    Patient Instructions  We will send off C diff testing.  EKG today -extra heart beats noted. Make sure you stay well hydrated Labs today.  Blood pressure was too high - start checking at home and if consistently elevated, start Toprol XL 25mg  once daily.  Return in 4 months for physical/wellness visit.    Follow up plan: Return in about 4 months (around 11/08/2019) for annual exam, prior fasting for blood work, medicare wellness visit.  [name], MD

## 2019-07-10 LAB — COMPREHENSIVE METABOLIC PANEL
ALT: 11 U/L (ref 0–35)
AST: 19 U/L (ref 0–37)
Albumin: 3.9 g/dL (ref 3.5–5.2)
Alkaline Phosphatase: 92 U/L (ref 39–117)
BUN: 21 mg/dL (ref 6–23)
CO2: 28 mEq/L (ref 19–32)
Calcium: 9.4 mg/dL (ref 8.4–10.5)
Chloride: 101 mEq/L (ref 96–112)
Creatinine, Ser: 0.9 mg/dL (ref 0.40–1.20)
GFR: 59.28 mL/min — ABNORMAL LOW (ref >60.00)
Glucose, Bld: 103 mg/dL — ABNORMAL HIGH (ref 70–99)
Potassium: 4.4 mEq/L (ref 3.5–5.1)
Sodium: 136 mEq/L (ref 135–145)
Total Bilirubin: 0.4 mg/dL (ref 0.2–1.2)
Total Protein: 6.8 g/dL (ref 6.0–8.3)

## 2019-07-10 LAB — CBC WITH DIFFERENTIAL/PLATELET
Basophils Absolute: 0.1 10*3/uL (ref 0.0–0.1)
Basophils Relative: 0.6 % (ref 0.0–3.0)
Eosinophils Absolute: 0.3 10*3/uL (ref 0.0–0.7)
Eosinophils Relative: 3.1 % (ref 0.0–5.0)
HCT: 40.8 % (ref 36.0–46.0)
Hemoglobin: 13.8 g/dL (ref 12.0–15.0)
Lymphocytes Relative: 21.3 % (ref 12.0–46.0)
Lymphs Abs: 2.1 10*3/uL (ref 0.7–4.0)
MCHC: 33.9 g/dL (ref 30.0–36.0)
MCV: 89.6 fl (ref 78.0–100.0)
Monocytes Absolute: 1.1 10*3/uL — ABNORMAL HIGH (ref 0.1–1.0)
Monocytes Relative: 10.7 % (ref 3.0–12.0)
Neutro Abs: 6.3 10*3/uL (ref 1.4–7.7)
Neutrophils Relative %: 64.3 % (ref 43.0–77.0)
Platelets: 228 10*3/uL (ref 150.0–400.0)
RBC: 4.56 Mil/uL (ref 3.87–5.11)
RDW: 15.1 % (ref 11.5–15.5)
WBC: 9.9 10*3/uL (ref 4.0–10.5)

## 2019-07-10 LAB — TSH: TSH: 0.84 u[IU]/mL (ref 0.35–4.50)

## 2019-07-10 LAB — T4, FREE: Free T4: 1.04 ng/dL (ref 0.60–1.60)

## 2019-07-11 LAB — C. DIFFICILE GDH AND TOXIN A/B
GDH ANTIGEN: NOT DETECTED
MICRO NUMBER:: 10251908
SPECIMEN QUALITY:: ADEQUATE
TOXIN A AND B: NOT DETECTED

## 2019-07-15 ENCOUNTER — Encounter: Payer: Self-pay | Admitting: Family Medicine

## 2019-07-19 ENCOUNTER — Telehealth: Payer: Self-pay | Admitting: Family Medicine

## 2019-07-19 NOTE — Telephone Encounter (Signed)
Patient was calling to check if Dr Darnell Level has received her daughters Deloris Ping message. I advised patient I did send it to Dr Darnell Level for review. She also is wondering if she should wait on see Dr Marcello Moores until her b/p is more under control? Patient said can reply through [facility] about everything.

## 2019-07-19 NOTE — Telephone Encounter (Signed)
Plz notify labs returned overall stable. Thyroid levels were normal. Ok to continue current thyroid medicine dose. Ok to start metoprolol (toprol XL) 25mg  daily, let me know how she does with this. C diff test was ok. She could call Dr Marcello Moores gen surgeon for f/u with her now.

## 2019-07-19 NOTE — Telephone Encounter (Signed)
Patient advised.

## 2019-07-19 NOTE — Telephone Encounter (Signed)
Pt called checking on lab results Pt had this done last week  Please advise

## 2019-07-24 ENCOUNTER — Other Ambulatory Visit: Payer: Self-pay | Admitting: Family Medicine

## 2019-07-24 NOTE — Telephone Encounter (Signed)
ERx 

## 2019-07-24 NOTE — Telephone Encounter (Signed)
Name of Medication: Clonazepam Name of Pharmacy: [facility] or [facility] Date and Quantity: 06/30/19, #30 Last Office Visit and Type: 07/09/19, 4-6 wk f/u Next Office Visit and Type: none Last Controlled Substance Agreement Date: 09/26/14 Last UDS: 09/26/14

## 2019-07-31 ENCOUNTER — Encounter: Payer: Self-pay | Admitting: Gastroenterology

## 2019-08-01 ENCOUNTER — Other Ambulatory Visit: Payer: Self-pay | Admitting: Family Medicine

## 2019-08-02 NOTE — Telephone Encounter (Signed)
Last rx sent 07/24/19, #30/0.  Request denied.

## 2019-08-02 NOTE — Telephone Encounter (Signed)
Pt's daughter, [name], called triage. The pt was changed from amlodipine to metoprolol. Soon after the change she started having diarrhea and weakness. Stopped taking it on the 4th and went back to amlodipine. For 3 days, he BP went down. Today it has gone back up. Last night 172/91. This am it was just as high.   Asking what she needs to do.   Also, asking why the clonazepam was denied.

## 2019-08-03 ENCOUNTER — Encounter: Payer: Self-pay | Admitting: Family Medicine

## 2019-08-03 MED ORDER — HYDRALAZINE HCL 25 MG PO TABS: 25.0000 mg | ORAL_TABLET | Freq: Two times a day (BID) | ORAL | 1 refills | Status: DC

## 2019-08-03 NOTE — Addendum Note (Signed)
Addended by: [name] on: 08/03/2019 06:07 PM   Modules accepted: Orders

## 2019-08-03 NOTE — Telephone Encounter (Signed)
See latest mychart message.

## 2019-08-03 NOTE — Telephone Encounter (Signed)
Patient called about my chart message Stated she would like to know what she should do since we are going into the weekend

## 2019-08-06 ENCOUNTER — Telehealth: Payer: Self-pay

## 2019-08-06 NOTE — Telephone Encounter (Signed)
-----   Message from Lin Landsman, MD sent at 08/06/2019 11:15 AM EDT ----- Regarding: Follow-up appointment Please schedule a follow-up to see me in next 2 to 3 weeks Can be in person or virtual, should be a quick follow-up Thank youRohini Vanga

## 2019-08-06 NOTE — Telephone Encounter (Signed)
Made appointment for patient on 08/21/2019 in [place] at 1:00pm

## 2019-08-08 ENCOUNTER — Encounter: Payer: Self-pay | Admitting: Family Medicine

## 2019-08-13 ENCOUNTER — Encounter: Payer: Self-pay | Admitting: Family Medicine

## 2019-08-16 ENCOUNTER — Encounter: Payer: Self-pay | Admitting: Family Medicine

## 2019-08-17 ENCOUNTER — Other Ambulatory Visit: Payer: Self-pay

## 2019-08-17 ENCOUNTER — Ambulatory Visit (INDEPENDENT_AMBULATORY_CARE_PROVIDER_SITE_OTHER): Payer: PPO | Admitting: Family Medicine

## 2019-08-17 ENCOUNTER — Encounter: Payer: Self-pay | Admitting: Family Medicine

## 2019-08-17 ENCOUNTER — Telehealth: Payer: Self-pay

## 2019-08-17 VITALS — BP 140/68 | HR 82 | Temp 98.0°F | Ht 61.0 in | Wt 159.3 lb

## 2019-08-17 DIAGNOSIS — I10 Essential (primary) hypertension: Secondary | ICD-10-CM | POA: Diagnosis not present

## 2019-08-17 DIAGNOSIS — A0472 Enterocolitis due to Clostridium difficile, not specified as recurrent: Secondary | ICD-10-CM | POA: Diagnosis not present

## 2019-08-17 MED ORDER — CLONIDINE HCL 0.1 MG PO TABS: ORAL_TABLET | ORAL | 3 refills | Status: DC

## 2019-08-17 NOTE — Progress Notes (Addendum)
This visit was conducted in person.  BP 140/68 (BP Location: Left Arm, Patient Position: Sitting, Cuff Size: Normal)   Pulse 82   Temp 98 F (36.7 C) (Temporal)   Ht 5\' 1"  (1.549 m)   Wt 159 lb 5 oz (72.3 kg)   SpO2 97%   BMI 30.10 kg/m   BP Readings from Last 3 Encounters:  08/17/19 140/68  07/09/19 (!) 160/72  05/21/19 124/66    CC: HTN f/u Subjective:    Patient ID: Brooke Beasley, female    DOB: [birth year], 86 y.o.   MRN: [id number]  HPI: Brooke Beasley is a 86 y.o. female presenting on 08/17/2019 for Anxiety (C/o feeling nervous/anxious.  States BP has been up and down.  Says she has seen Dr. Darnell Level for similar sxs recently.  )   See prior note for details.  Currently on amlodipine 10mg  daily, lasix 20mg  daily (taking daily), hydralazine 25mg  BID.  Multiple drug allergies.  Last visit we added Toprol XL to her amlodipine - after misunderstanding she took only toprol xl with subsequent elevation in blood pressures and metoprolol possibly caused diarrhea/weakness so she stopped this.   We subsequently started hydralazine 25mg  bid in addition to her amlodipine 10mg  daily.   She brings log showing BP 139-175/80s, HR 70-80s  She thinks hydralazine may be causing more nervousness and hand tremors in the mornings.   Got 1st COVID vaccine 08/10/2019 and tolerated well     Relevant past medical, surgical, family and social history reviewed and updated as indicated. Interim medical history since our last visit reviewed. Allergies and medications reviewed and updated. Outpatient Medications Prior to Visit  Medication Sig Dispense Refill  . acetaminophen (TYLENOL) 500 MG tablet Take 500-1,000 mg by mouth at bedtime. Depends on pain    . albuterol (PROVENTIL HFA;VENTOLIN HFA) 108 (90 Base) MCG/ACT inhaler Inhale 2 puffs into the lungs every 6 (six) hours as needed for wheezing or shortness of breath. 1 Inhaler 0  . amLODipine (NORVASC) 10 MG tablet Take 1 tablet by  mouth once daily 90 tablet 3  . bimatoprost (LUMIGAN) 0.01 % SOLN Place 1 drop into both eyes at bedtime.    Marland Kitchen Bioflavonoid Products (VITAMIN C) CHEW Chew 1 tablet by mouth daily.    . cholecalciferol (VITAMIN D) 1000 units tablet Take 1,000 Units by mouth daily.    . clonazePAM (KLONOPIN) 0.5 MG tablet TAKE 1 TABLET BY MOUTH AT BEDTIME AS NEEDED FOR ANXIETY 30 tablet 0  . colchicine 0.6 MG tablet Take 1 tablet (0.6 mg total) by mouth daily as needed (gout flare). 90 tablet 0  . denosumab (PROLIA) 60 MG/ML SOSY injection Inject 60 mg into the skin every 6 (six) months. 1 mL 1  . EUTHYROX 100 MCG tablet Take 1 tablet by mouth once daily 90 tablet 1  . fish oil-omega-3 fatty acids 1000 MG capsule Take 1 g by mouth daily.     . furosemide (LASIX) 20 MG tablet TAKE 1 TABLET BY MOUTH ONCE DAILY AS NEEDED FOR EDEMA 90 tablet 1  . HYDROcodone-acetaminophen (NORCO/VICODIN) 5-325 MG tablet Take 1-2 tablets by mouth every 6 (six) hours as needed. 6 tablet 0  . hydrocortisone (ANUSOL-HC) 2.5 % rectal cream Place 1 application rectally 2 (two) times daily. 30 g 0  . methocarbamol (ROBAXIN) 500 MG tablet Take 1 tablet (500 mg total) by mouth every 8 (eight) hours as needed for muscle spasms. 8 tablet 0  . Misc Natural Products (OSTEO BI-FLEX  TRIPLE STRENGTH PO) Take 1 tablet by mouth daily. With Vit D    . omeprazole (PRILOSEC) 20 MG capsule Take 1 capsule by mouth daily 90 capsule 2  . potassium chloride (KLOR-CON) 10 MEQ tablet TAKE 1 TABLET BY MOUTH ONCE DAILY AS NEEDED (WITH  LASIX  (FUROSEMIDE)) 90 tablet 0  . sertraline (ZOLOFT) 100 MG tablet Take 1 tablet by mouth daily 90 tablet 2  . TURMERIC PO Take 1 tablet by mouth daily.    Marland Kitchen VITAMIN E PO Take 1 tablet by mouth daily.    . VOLTAREN 1 % GEL APPLY ONE APPLICATION TOPICALLY 3 TIMES DAILY (Patient taking differently: APPLY ONE APPLICATION TOPICALLY daily as needed for shoulder pain) 100 g 3  . hydrALAZINE (APRESOLINE) 25 MG tablet Take 1 tablet (25 mg  total) by mouth in the morning and at bedtime. 60 tablet 1   No facility-administered medications prior to visit.     Per HPI unless specifically indicated in ROS section below Review of Systems Objective:    BP 140/68 (BP Location: Left Arm, Patient Position: Sitting, Cuff Size: Normal)   Pulse 82   Temp 98 F (36.7 C) (Temporal)   Ht 5\' 1"  (1.549 m)   Wt 159 lb 5 oz (72.3 kg)   SpO2 97%   BMI 30.10 kg/m   Wt Readings from Last 3 Encounters:  08/17/19 159 lb 5 oz (72.3 kg)  07/09/19 160 lb 2 oz (72.6 kg)  05/21/19 157 lb 5 oz (71.4 kg)    Physical Exam Vitals and nursing note reviewed.  Constitutional:      Appearance: Normal appearance. She is not ill-appearing.  Cardiovascular:     Rate and Rhythm: Normal rate. Rhythm irregular.     Pulses: Normal pulses.     Heart sounds: Murmur (systolic) present.     Comments: Occasional ectopic beats Pulmonary:     Effort: Pulmonary effort is normal. No respiratory distress.     Breath sounds: Normal breath sounds. No wheezing or rales.  Musculoskeletal:     Right lower leg: No edema.     Left lower leg: No edema.  Skin:    Findings: No rash.  Neurological:     Mental Status: She is alert.  Psychiatric:        Mood and Affect: Mood normal.        Behavior: Behavior normal.       Depression screen [employer] 2/9 08/17/2019 10/20/2018 10/13/2017 10/08/2016 10/07/2015  Decreased Interest 1 0 1 1 -  Down, Depressed, Hopeless 1 0 1 0 0  PHQ - 2 Score 2 0 2 1 0  Altered sleeping 3 0 3 3 -  Tired, decreased energy 2 0 3 1 -  Change in appetite 0 0 0 0 -  Feeling bad or failure about yourself  1 0 0 1 -  Trouble concentrating 0 0 0 0 -  Moving slowly or fidgety/restless 1 0 3 1 -  Suicidal thoughts 0 0 0 0 -  PHQ-9 Score 9 0 11 7 -  Difficult doing work/chores - Not difficult at all Somewhat difficult Not difficult at all -  Some recent data might be hidden    GAD 7 : Generalized Anxiety Score 08/17/2019  Nervous, Anxious, on Edge 1    Control/stop worrying 1  Worry too much - different things 1  Trouble relaxing 1  Restless 0  Easily annoyed or irritable 1  Afraid - awful might happen 0  Total GAD  7 Score 5   Assessment & Plan:  This visit occurred during the SARS-CoV-2 public health emergency.  Safety protocols were in place, including screening questions prior to the visit, additional usage of staff PPE, and extensive cleaning of exam room while observing appropriate contact time as indicated for disinfecting solutions.   Problem List Items Addressed This Visit    HYPERTENSION, BENIGN    Chronic, recently elevated with several changes in BP regimen - but she is intolerant to several BP meds. Latest hydralazine may be causing worsening nervousness. Previously metoprolol caused diarrhea/weakness. Will continue amlodipine 10mg  and trial clonidine 0.1mg  nightly x 1 wk then increase to 0.1mg  bid. Update with effect and tolerance. Pt agrees with plan.       Relevant Medications   cloNIDine (CATAPRES) 0.1 MG tablet   C. difficile diarrhea    Latest C diff negative. To meet with GI to discuss fecal transplant.           Meds ordered this encounter  Medications  . cloNIDine (CATAPRES) 0.1 MG tablet    Sig: Take 1 tablet (0.1mg ) nightly for 1 week then increase to twice daily    Dispense:  60 tablet    Refill:  3    To replace hydralazine   No orders of the defined types were placed in this encounter.   Patient Instructions  Let's stop hydralazine to see if nervousness, tremors improve.  Instead start clonidine 0.1mg  nightly for 1 week and if tolerated well, increase to twice daily. If once daily is controlling BP, stay on one a day. Continue amlodipine 10mg  daily (max dose).  Update me with BP readings in 1-2 weeks.  We have room to increase clonidine dose.  After recent tick bite, let us know if you develop any fever, skin rash, new joint pains or headache or abdominal pain.    Follow up plan: No  follow-ups on file.  [name], MD

## 2019-08-17 NOTE — Telephone Encounter (Signed)
Seen today. 

## 2019-08-17 NOTE — Patient Instructions (Addendum)
Let's stop hydralazine to see if nervousness, tremors improve.  Instead start clonidine 0.1mg  nightly for 1 week and if tolerated well, increase to twice daily. If once daily is controlling BP, stay on one a day. Continue amlodipine 10mg  daily (max dose).  Update me with BP readings in 1-2 weeks.  We have room to increase clonidine dose.  After recent tick bite, let us know if you develop any fever, skin rash, new joint pains or headache or abdominal pain.

## 2019-08-17 NOTE — Assessment & Plan Note (Signed)
Chronic, recently elevated with several changes in BP regimen - but she is intolerant to several BP meds. Latest hydralazine may be causing worsening nervousness. Previously metoprolol caused diarrhea/weakness. Will continue amlodipine 10mg  and trial clonidine 0.1mg  nightly x 1 wk then increase to 0.1mg  bid. Update with effect and tolerance. Pt agrees with plan.

## 2019-08-17 NOTE — Telephone Encounter (Signed)
Pt said earlier this morning BP 160/93 and 150/84;pt said at some time pt could not remember when top number for BP was 170. Pt feels jittery, nervous, slight h/a and numb or drowsy feeling in forehead on and off for long time. Now BP 146/83 P 70. Pt has no covid symptoms, no travel and no known exposure to + covid. Pt scheduled 30' appt today with Dr Darnell Level at [place] & ED precautions given and pt voiced understanding.

## 2019-08-17 NOTE — Assessment & Plan Note (Signed)
Latest C diff negative. To meet with GI to discuss fecal transplant.

## 2019-08-17 NOTE — Telephone Encounter (Signed)
Will see then. Currently on amlodipine 10mg  daily, lasix 20mg  daily PRN, hydralazine 25mg  BID Multiple drug allergies.

## 2019-08-21 ENCOUNTER — Telehealth (INDEPENDENT_AMBULATORY_CARE_PROVIDER_SITE_OTHER): Payer: PPO | Admitting: Gastroenterology

## 2019-08-21 ENCOUNTER — Other Ambulatory Visit: Payer: Self-pay

## 2019-08-21 ENCOUNTER — Encounter: Payer: Self-pay | Admitting: Gastroenterology

## 2019-08-21 DIAGNOSIS — Z8619 Personal history of other infectious and parasitic diseases: Secondary | ICD-10-CM | POA: Diagnosis not present

## 2019-08-21 DIAGNOSIS — K5909 Other constipation: Secondary | ICD-10-CM

## 2019-08-21 MED ORDER — NA SULFATE-K SULFATE-MG SULF 17.5-3.13-1.6 GM/177ML PO SOLN
354.0000 mL | Freq: Once | ORAL | 0 refills | Status: AC
Start: 2019-08-21 — End: 2019-08-21

## 2019-08-21 NOTE — Progress Notes (Signed)
Sherri Sear, MD [address]  [place]  [place], [place] [address]  Main: [phone]  Fax: [phone]    Gastroenterology Consultation Video Visit  Referring Provider:     [name], MD Primary Care Physician:  [name], MD Primary Gastroenterologist:  Dr. Cephas Darby Reason for Consultation: Constipation, history of C. difficile        HPI:   Brooke Beasley is a 86 y.o. female referred by Dr. [name], MD  for consultation & management of constipation, history of C. difficile  Virtual Visit Video Note  I connected with Brooke Beasley on 08/21/19 at  1:00 PM EDT by video and verified that I am speaking with the correct person using two identifiers.   I discussed the limitations, risks, security and privacy concerns of performing an evaluation and management service by video and the availability of in person appointments. I also discussed with the patient that there may be a patient responsible charge related to this service. The patient expressed understanding and agreed to proceed.  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 Graylon Good in 04/2019. Patient reported resolution of diarrhea for 3 to 4 weeks. Currently, she has been experiencing irregular bowel habits, hard stools, mucus discharge, sometimes needing manual disimpaction, followed by overflow diarrhea. She would like to know if she can get fecal microbiota transplantation for these symptoms. She does not have any other GI symptoms. She is now working with her PCP in controlling her blood pressure   NSAIDs: None  Antiplts/Anticoagulants/Anti thrombotics: None  GI Procedures: Flexible  sigmoidoscopy, incomplete colonoscopy CT colonography  Past Medical History:  Diagnosis Date  . Anxiety   . CAP (community acquired pneumonia) 12/22/2017  . Carotid stenosis 10/18/2014   R 1-39%, L 40-59%, rpt 1 yr (09/2014)   . CKD (chronic kidney disease) stage 3, GFR 30-59 ml/min 08/02/2014  . Degenerative disc disease    LS  . Depression    nervous breakdonw in 1964-out of work for a year  . Diverticulosis    severe by colonoscopy  . Family history of adverse reaction to anesthesia    n/v  . GERD (gastroesophageal reflux disease)   . Glaucoma   . Heart murmur 5/13   mitral regurge - on echo   . History of hiatal hernia   . History of shingles   . HTN (hypertension)   . Hyperlipidemia   . Hypertension   . Hypothyroidism   . IBS 11/02/2006  . Intestinal bacterial overgrowth    In small colon  . Maxillary fracture ([place]) 04/08/2012  . Osteoarthritis 2016   [facility])  . Osteoporosis    dexa 2011  . Pseudogout 2016   shoulders ([place])    Past Surgical History:  Procedure Laterality Date  . barium enema  2012   severe diverticulosis, redundant colon  . Bowel obstruction  1999   no surgery in hosp x 3 days  . COLONOSCOPY  1. 1999  2. 11/04   1. Not finished  2. Slight hemorrhage rectosigmoid area, severe sig diverticulosis  . Dexa  1. [id number]   2. 9/04  3. 3/08    1. OP  2. OP, borderline, spine -2.44T  3. decreased BMD-OP  .  DEXA  2011   OP in spine  . DG KNEE 1-2 VIEWS BILAT     LS x-ray with degenerative disc and facet change  . ESOPHAGOGASTRODUODENOSCOPY     Negative  . Hemorrhoid procedure  4/08  . JOINT REPLACEMENT    . TONSILLECTOMY    . TOTAL SHOULDER ARTHROPLASTY Left 11/27/2015   Corky Mull, MD  . TOTAL SHOULDER REVISION Left 03/16/2016   Procedure: TOTAL SHOULDER REVISION;  Surgeon: Corky Mull, MD;  Location: [facility] ORS;  Service: Orthopedics;  Laterality: Left;  . US ECHOCARDIOGRAPHY  [id number]   Normal systolic fxn with EF 0000000.  Focal basal septal  hypertrophy.  Mild diastolic dysfunction.  Mild MR.   Current Outpatient Medications:  .  acetaminophen (TYLENOL) 500 MG tablet, Take 500-1,000 mg by mouth at bedtime. Depends on pain, Disp: , Rfl:  .  albuterol (PROVENTIL HFA;VENTOLIN HFA) 108 (90 Base) MCG/ACT inhaler, Inhale 2 puffs into the lungs every 6 (six) hours as needed for wheezing or shortness of breath., Disp: 1 Inhaler, Rfl: 0 .  amLODipine (NORVASC) 10 MG tablet, Take 1 tablet by mouth once daily, Disp: 90 tablet, Rfl: 3 .  bimatoprost (LUMIGAN) 0.01 % SOLN, Place 1 drop into both eyes at bedtime., Disp: , Rfl:  .  Bioflavonoid Products (VITAMIN C) CHEW, Chew 1 tablet by mouth daily., Disp: , Rfl:  .  cholecalciferol (VITAMIN D) 1000 units tablet, Take 1,000 Units by mouth daily., Disp: , Rfl:  .  clonazePAM (KLONOPIN) 0.5 MG tablet, TAKE 1 TABLET BY MOUTH AT BEDTIME AS NEEDED FOR ANXIETY, Disp: 30 tablet, Rfl: 0 .  cloNIDine (CATAPRES) 0.1 MG tablet, Take 1 tablet (0.1mg ) nightly for 1 week then increase to twice daily, Disp: 60 tablet, Rfl: 3 .  colchicine 0.6 MG tablet, Take 1 tablet (0.6 mg total) by mouth daily as needed (gout flare)., Disp: 90 tablet, Rfl: 0 .  denosumab (PROLIA) 60 MG/ML SOSY injection, Inject 60 mg into the skin every 6 (six) months., Disp: 1 mL, Rfl: 1 .  EUTHYROX 100 MCG tablet, Take 1 tablet by mouth once daily, Disp: 90 tablet, Rfl: 1 .  fish oil-omega-3 fatty acids 1000 MG capsule, Take 1 g by mouth daily. , Disp: , Rfl:  .  furosemide (LASIX) 20 MG tablet, TAKE 1 TABLET BY MOUTH ONCE DAILY AS NEEDED FOR EDEMA, Disp: 90 tablet, Rfl: 1 .  HYDROcodone-acetaminophen (NORCO/VICODIN) 5-325 MG tablet, Take 1-2 tablets by mouth every 6 (six) hours as needed., Disp: 6 tablet, Rfl: 0 .  hydrocortisone (ANUSOL-HC) 2.5 % rectal cream, Place 1 application rectally 2 (two) times daily., Disp: 30 g, Rfl: 0 .  methocarbamol (ROBAXIN) 500 MG tablet, Take 1 tablet (500 mg total) by mouth every 8 (eight) hours as needed  for muscle spasms., Disp: 8 tablet, Rfl: 0 .  Misc Natural Products (OSTEO BI-FLEX TRIPLE STRENGTH PO), Take 1 tablet by mouth daily. With Vit D, Disp: , Rfl:  .  omeprazole (PRILOSEC) 20 MG capsule, Take 1 capsule by mouth daily, Disp: 90 capsule, Rfl: 2 .  potassium chloride (KLOR-CON) 10 MEQ tablet, TAKE 1 TABLET BY MOUTH ONCE DAILY AS NEEDED (WITH  LASIX  (FUROSEMIDE)), Disp: 90 tablet, Rfl: 0 .  sertraline (ZOLOFT) 100 MG tablet, Take 1 tablet by mouth daily, Disp: 90 tablet, Rfl: 2 .  TURMERIC PO, Take 1 tablet by mouth daily., Disp: , Rfl:  .  VITAMIN E PO, Take 1 tablet by mouth daily., Disp: , Rfl:  .  VOLTAREN 1 % GEL, APPLY ONE APPLICATION TOPICALLY 3 TIMES DAILY (Patient taking differently: APPLY ONE APPLICATION TOPICALLY daily as needed for shoulder pain), Disp: 100 g, Rfl: 3 .  Na Sulfate-K Sulfate-Mg Sulf 17.5-3.13-1.6 GM/177ML SOLN, Take 354 mLs by mouth once for 1 dose., Disp: 354 mL, Rfl: 0    Family History  Problem Relation Age of Onset  . Diabetes Brother        post-op  . Coronary artery disease Brother   . Diabetes Paternal Grandfather   . Breast cancer Cousin      Social History   Tobacco Use  . Smoking status: Never Smoker  . Smokeless tobacco: Never Used  Substance Use Topics  . Alcohol use: No  . Drug use: No    Allergies as of 08/21/2019 - Review Complete 08/21/2019  Allergen Reaction Noted  . Prednisone Other (See Comments) 11/12/2015  . Ace inhibitors Other (See Comments) 04/02/2008  . Alendronate sodium  09/14/2006  . Losartan Other (See Comments) 09/26/2014  . Other Other (See Comments) 11/12/2015  . Paroxetine hcl Other (See Comments) 06/15/2011  . Risedronate sodium  03/13/2007  . Silenor [doxepin hcl] Other (See Comments) 04/02/2013  . Simvastatin  10/03/2009  . Sulfonamide derivatives  06/08/2006  . Toprol xl [metoprolol] Diarrhea 08/03/2019  . Tramadol Other (See Comments) 11/12/2015  . Influenza vaccines Rash and Other (See Comments)  05/24/2014    Imaging Studies: Reviewed  Assessment and Plan:   Gwendolin Pineault is a 86 y.o. female with history of severe diverticulosis, depression, anxiety, insomnia, severe constipation, abdominal bloating followed by antibiotic associated C. difficile diarrhea in 2/2020s/p 10days of oral vancomycin, 1st recurrence in 06/2018,treated with 2 weeks of Dificidwith prolonged taper. Second recurrence in 09/2018, treated with long-termoral vancomycin.C. difficile toxin returned positive on 11/14/2018 while on oral vancomycin, s/p Zinplava in 04/2019 with resolution of diarrhea. Patient is not on any antibiotics at this time. Patient is now experiencing constipation associated with mucus discharge per rectum and overflow diarrhea, lower abdominal discomfort. I have discussed with her about colonoscopy with pediatric colonoscope. Based on the colonoscopy findings, I will refer her to colorectal surgeon to evaluate for any rectocele   Follow Up Instructions:   I discussed the assessment and treatment plan with the patient. The patient was provided an opportunity to ask questions and all were answered. The patient agreed with the plan and demonstrated an understanding of the instructions.   The patient was advised to call back or seek an in-person evaluation if the symptoms worsen or if the condition fails to improve as anticipated.  I provided 20 minutes of face-to-face time during this encounter.   Follow up as needed   Cephas Darby, MD

## 2019-08-25 ENCOUNTER — Other Ambulatory Visit: Payer: Self-pay | Admitting: Family Medicine

## 2019-08-25 NOTE — Telephone Encounter (Signed)
Name of Medication: Clonazepam Name of Pharmacy: [facility] or Written Date and Quantity: 07/24/19, #30 Last Office Visit and Type: 08/17/19 Next Office Visit and Type: 08/29/19 b/p follow up Last Controlled Substance Agreement Date: 09/26/14 Last UDS: 09/26/14  Last time Potassium was refilled on 05/24/19 #90 with 0 refill

## 2019-08-29 ENCOUNTER — Encounter: Payer: Self-pay | Admitting: Family Medicine

## 2019-08-29 ENCOUNTER — Other Ambulatory Visit: Payer: Self-pay

## 2019-08-29 ENCOUNTER — Ambulatory Visit (INDEPENDENT_AMBULATORY_CARE_PROVIDER_SITE_OTHER): Payer: PPO | Admitting: Family Medicine

## 2019-08-29 VITALS — BP 132/70 | HR 57 | Temp 98.0°F | Ht 61.0 in | Wt 162.2 lb

## 2019-08-29 DIAGNOSIS — I10 Essential (primary) hypertension: Secondary | ICD-10-CM | POA: Diagnosis not present

## 2019-08-29 DIAGNOSIS — G8929 Other chronic pain: Secondary | ICD-10-CM

## 2019-08-29 DIAGNOSIS — K5909 Other constipation: Secondary | ICD-10-CM | POA: Diagnosis not present

## 2019-08-29 DIAGNOSIS — N183 Chronic kidney disease, stage 3 unspecified: Secondary | ICD-10-CM

## 2019-08-29 DIAGNOSIS — R109 Unspecified abdominal pain: Secondary | ICD-10-CM

## 2019-08-29 DIAGNOSIS — R011 Cardiac murmur, unspecified: Secondary | ICD-10-CM | POA: Diagnosis not present

## 2019-08-29 MED ORDER — ATENOLOL 25 MG PO TABS
25.0000 mg | ORAL_TABLET | Freq: Every day | ORAL | 3 refills | Status: DC
Start: 2019-08-29 — End: 2019-09-14

## 2019-08-29 NOTE — Patient Instructions (Addendum)
Decrease clonidine to once daily for 3 days then stop.  Trial atenolol 25mg  once daily for blood pressure control.  Continue amlodipine 10mg  daily. Update [place] with blood pressure readings over the next 2 weeks.

## 2019-08-29 NOTE — Assessment & Plan Note (Signed)
Chronic, uncontrolled. Feels clonidine is constipation, does not want to try transdermal preparation. Will transition off clonidine onto atenolol. Continue amlodipine. She also continues lasix 20mg  daily.

## 2019-08-29 NOTE — Telephone Encounter (Signed)
ERx 

## 2019-08-29 NOTE — Assessment & Plan Note (Addendum)
ACEI caused cough ARB previously caused bump in creatinine.

## 2019-08-29 NOTE — Assessment & Plan Note (Signed)
Presumed aortic valve sclerosis.

## 2019-08-29 NOTE — Progress Notes (Signed)
This visit was conducted in person.  BP 132/70 (BP Location: Left Arm, Patient Position: Sitting, Cuff Size: Normal)   Pulse (!) 57   Temp 98 F (36.7 C) (Temporal)   Ht 5\' 1"  (1.549 m)   Wt 162 lb 4 oz (73.6 kg)   SpO2 99%   BMI 30.66 kg/m   160/80 on repeat.  CC: HTN f/u visit  Subjective:    Patient ID: Brooke Beasley, female    DOB: 1930/12/19, 86 y.o.   MRN: [id number]  HPI: Brooke Beasley is a 86 y.o. female presenting on 08/29/2019 for Hypertension (Here for f/u.  Pt accompanied by daughter, [name]- temp 98.2.)   See prior note for details.  Here for 2 week follow up visit.   HTN - Compliant with current antihypertensive regimen of amlodipine 10mg  daily, lasix 20mg  daily, clonidine 0.1mg  bid. Does check blood pressures at home and brings log: 130-160/60-70s, HR 50-70s. No low blood pressure readings or symptoms of dizziness/syncope. Denies HA, vision changes, CP/tightness, SOB. Notes some fluid retention.   Notes increasing constipation since starting clonidine.   Planned colonoscopy later this month.      Relevant past medical, surgical, family and social history reviewed and updated as indicated. Interim medical history since our last visit reviewed. Allergies and medications reviewed and updated. Outpatient Medications Prior to Visit  Medication Sig Dispense Refill  . acetaminophen (TYLENOL) 500 MG tablet Take 500-1,000 mg by mouth at bedtime. Depends on pain    . albuterol (PROVENTIL HFA;VENTOLIN HFA) 108 (90 Base) MCG/ACT inhaler Inhale 2 puffs into the lungs every 6 (six) hours as needed for wheezing or shortness of breath. 1 Inhaler 0  . amLODipine (NORVASC) 10 MG tablet Take 1 tablet by mouth once daily 90 tablet 3  . bimatoprost (LUMIGAN) 0.01 % SOLN Place 1 drop into both eyes at bedtime.    Marland Kitchen Bioflavonoid Products (VITAMIN C) CHEW Chew 1 tablet by mouth daily.    . cholecalciferol (VITAMIN D) 1000 units tablet Take 1,000 Units by mouth daily.     . clonazePAM (KLONOPIN) 0.5 MG tablet TAKE 1 TABLET BY MOUTH AT BEDTIME AS NEEDED FOR ANXIETY 30 tablet 0  . colchicine 0.6 MG tablet Take 1 tablet (0.6 mg total) by mouth daily as needed (gout flare). 90 tablet 0  . denosumab (PROLIA) 60 MG/ML SOSY injection Inject 60 mg into the skin every 6 (six) months. 1 mL 1  . EUTHYROX 100 MCG tablet Take 1 tablet by mouth once daily 90 tablet 1  . fish oil-omega-3 fatty acids 1000 MG capsule Take 1 g by mouth daily.     . furosemide (LASIX) 20 MG tablet TAKE 1 TABLET BY MOUTH ONCE DAILY AS NEEDED FOR EDEMA 90 tablet 1  . HYDROcodone-acetaminophen (NORCO/VICODIN) 5-325 MG tablet Take 1-2 tablets by mouth every 6 (six) hours as needed. 6 tablet 0  . hydrocortisone (ANUSOL-HC) 2.5 % rectal cream Place 1 application rectally 2 (two) times daily. 30 g 0  . methocarbamol (ROBAXIN) 500 MG tablet Take 1 tablet (500 mg total) by mouth every 8 (eight) hours as needed for muscle spasms. 8 tablet 0  . Misc Natural Products (OSTEO BI-FLEX TRIPLE STRENGTH PO) Take 1 tablet by mouth daily. With Vit D    . omeprazole (PRILOSEC) 20 MG capsule Take 1 capsule by mouth daily 90 capsule 2  . potassium chloride (KLOR-CON) 10 MEQ tablet TAKE 1 TABLET BY MOUTH ONCE DAILY AS NEEDED (WITH  LASIX  (FUROSEMIDE))  90 tablet 0  . sertraline (ZOLOFT) 100 MG tablet Take 1 tablet by mouth daily 90 tablet 2  . TURMERIC PO Take 1 tablet by mouth daily.    Marland Kitchen VITAMIN E PO Take 1 tablet by mouth daily.    . VOLTAREN 1 % GEL APPLY ONE APPLICATION TOPICALLY 3 TIMES DAILY (Patient taking differently: APPLY ONE APPLICATION TOPICALLY daily as needed for shoulder pain) 100 g 3  . cloNIDine (CATAPRES) 0.1 MG tablet Take 1 tablet (0.1mg ) nightly for 1 week then increase to twice daily 60 tablet 3   No facility-administered medications prior to visit.     Per HPI unless specifically indicated in ROS section below Review of Systems Objective:  BP 132/70 (BP Location: Left Arm, Patient Position:  Sitting, Cuff Size: Normal)   Pulse (!) 57   Temp 98 F (36.7 C) (Temporal)   Ht 5\' 1"  (1.549 m)   Wt 162 lb 4 oz (73.6 kg)   SpO2 99%   BMI 30.66 kg/m   Wt Readings from Last 3 Encounters:  08/29/19 162 lb 4 oz (73.6 kg)  08/17/19 159 lb 5 oz (72.3 kg)  07/09/19 160 lb 2 oz (72.6 kg)      Physical Exam Vitals and nursing note reviewed.  Constitutional:      Appearance: Normal appearance. She is not ill-appearing.  Cardiovascular:     Rate and Rhythm: Normal rate and regular rhythm.     Pulses: Normal pulses.     Heart sounds: Murmur (3/6 systolic) present.  Pulmonary:     Effort: Pulmonary effort is normal. No respiratory distress.     Breath sounds: Normal breath sounds. No wheezing, rhonchi or rales.  Musculoskeletal:     Right lower leg: No edema.     Left lower leg: Edema (tr pedal edema) present.  Neurological:     Mental Status: She is alert.       Assessment & Plan:  This visit occurred during the SARS-CoV-2 public health emergency.  Safety protocols were in place, including screening questions prior to the visit, additional usage of staff PPE, and extensive cleaning of exam room while observing appropriate contact time as indicated for disinfecting solutions.   Problem List Items Addressed This Visit    Systolic murmur    Presumed aortic valve sclerosis.      HYPERTENSION, BENIGN - Primary    Chronic, uncontrolled. Feels clonidine is constipation, does not want to try transdermal preparation. Will transition off clonidine onto atenolol. Continue amlodipine. She also continues lasix 20mg  daily.       Relevant Medications   atenolol (TENORMIN) 25 MG tablet   CKD (chronic kidney disease) stage 3, GFR 30-59 ml/min    ACEI caused cough ARB previously caused bump in creatinine.       Chronic constipation    Feels worse with clonidine - will stop.       Chronic abdominal pain       Meds ordered this encounter  Medications  . atenolol (TENORMIN) 25 MG  tablet    Sig: Take 1 tablet (25 mg total) by mouth daily.    Dispense:  30 tablet    Refill:  3   No orders of the defined types were placed in this encounter.   Follow up plan: Return if symptoms worsen or fail to improve.  [name], MD

## 2019-08-29 NOTE — Assessment & Plan Note (Signed)
Feels worse with clonidine - will stop.

## 2019-08-30 ENCOUNTER — Ambulatory Visit: Payer: PPO | Admitting: Family Medicine

## 2019-09-06 ENCOUNTER — Other Ambulatory Visit
Admission: RE | Admit: 2019-09-06 | Discharge: 2019-09-06 | Disposition: A | Discharge status: Home / Self Care | Payer: PPO | Source: Ambulatory Visit | Attending: Gastroenterology | Admitting: Gastroenterology

## 2019-09-06 ENCOUNTER — Other Ambulatory Visit: Payer: Self-pay

## 2019-09-06 DIAGNOSIS — Z20822 Contact with and (suspected) exposure to covid-19: Secondary | ICD-10-CM | POA: Diagnosis not present

## 2019-09-06 DIAGNOSIS — Z01812 Encounter for preprocedural laboratory examination: Secondary | ICD-10-CM | POA: Diagnosis not present

## 2019-09-06 LAB — SARS CORONAVIRUS 2 (TAT 6-24 HRS): SARS Coronavirus 2: NEGATIVE

## 2019-09-07 ENCOUNTER — Encounter: Payer: Self-pay | Admitting: Gastroenterology

## 2019-09-10 ENCOUNTER — Ambulatory Visit
Admission: RE | Admit: 2019-09-10 | Discharge: 2019-09-10 | Disposition: A | Discharge status: Home / Self Care | Payer: PPO | Attending: Gastroenterology | Admitting: Gastroenterology

## 2019-09-10 ENCOUNTER — Ambulatory Visit: Payer: PPO | Admitting: Anesthesiology

## 2019-09-10 ENCOUNTER — Encounter
Admission: RE | Disposition: A | Discharge status: Home / Self Care | Payer: Self-pay | Source: Home / Self Care | Attending: Gastroenterology

## 2019-09-10 DIAGNOSIS — M810 Age-related osteoporosis without current pathological fracture: Secondary | ICD-10-CM | POA: Diagnosis not present

## 2019-09-10 DIAGNOSIS — Z96612 Presence of left artificial shoulder joint: Secondary | ICD-10-CM | POA: Insufficient documentation

## 2019-09-10 DIAGNOSIS — Z887 Allergy status to serum and vaccine status: Secondary | ICD-10-CM | POA: Insufficient documentation

## 2019-09-10 DIAGNOSIS — Z882 Allergy status to sulfonamides status: Secondary | ICD-10-CM | POA: Diagnosis not present

## 2019-09-10 DIAGNOSIS — M1990 Unspecified osteoarthritis, unspecified site: Secondary | ICD-10-CM | POA: Diagnosis not present

## 2019-09-10 DIAGNOSIS — F329 Major depressive disorder, single episode, unspecified: Secondary | ICD-10-CM | POA: Diagnosis not present

## 2019-09-10 DIAGNOSIS — I129 Hypertensive chronic kidney disease with stage 1 through stage 4 chronic kidney disease, or unspecified chronic kidney disease: Secondary | ICD-10-CM | POA: Diagnosis not present

## 2019-09-10 DIAGNOSIS — K589 Irritable bowel syndrome without diarrhea: Secondary | ICD-10-CM | POA: Insufficient documentation

## 2019-09-10 DIAGNOSIS — D120 Benign neoplasm of cecum: Secondary | ICD-10-CM | POA: Diagnosis not present

## 2019-09-10 DIAGNOSIS — I6523 Occlusion and stenosis of bilateral carotid arteries: Secondary | ICD-10-CM | POA: Insufficient documentation

## 2019-09-10 DIAGNOSIS — K5730 Diverticulosis of large intestine without perforation or abscess without bleeding: Secondary | ICD-10-CM | POA: Diagnosis not present

## 2019-09-10 DIAGNOSIS — F419 Anxiety disorder, unspecified: Secondary | ICD-10-CM | POA: Diagnosis not present

## 2019-09-10 DIAGNOSIS — K5909 Other constipation: Secondary | ICD-10-CM

## 2019-09-10 DIAGNOSIS — Z888 Allergy status to other drugs, medicaments and biological substances status: Secondary | ICD-10-CM | POA: Insufficient documentation

## 2019-09-10 DIAGNOSIS — K623 Rectal prolapse: Secondary | ICD-10-CM | POA: Diagnosis not present

## 2019-09-10 DIAGNOSIS — D122 Benign neoplasm of ascending colon: Secondary | ICD-10-CM | POA: Diagnosis not present

## 2019-09-10 DIAGNOSIS — N183 Chronic kidney disease, stage 3 unspecified: Secondary | ICD-10-CM | POA: Diagnosis not present

## 2019-09-10 DIAGNOSIS — Z8249 Family history of ischemic heart disease and other diseases of the circulatory system: Secondary | ICD-10-CM | POA: Insufficient documentation

## 2019-09-10 DIAGNOSIS — K635 Polyp of colon: Secondary | ICD-10-CM | POA: Diagnosis not present

## 2019-09-10 DIAGNOSIS — K219 Gastro-esophageal reflux disease without esophagitis: Secondary | ICD-10-CM | POA: Diagnosis not present

## 2019-09-10 DIAGNOSIS — R1030 Lower abdominal pain, unspecified: Secondary | ICD-10-CM | POA: Diagnosis not present

## 2019-09-10 DIAGNOSIS — H409 Unspecified glaucoma: Secondary | ICD-10-CM | POA: Diagnosis not present

## 2019-09-10 DIAGNOSIS — Z79899 Other long term (current) drug therapy: Secondary | ICD-10-CM | POA: Diagnosis not present

## 2019-09-10 DIAGNOSIS — K5900 Constipation, unspecified: Secondary | ICD-10-CM | POA: Diagnosis not present

## 2019-09-10 DIAGNOSIS — E785 Hyperlipidemia, unspecified: Secondary | ICD-10-CM | POA: Diagnosis not present

## 2019-09-10 DIAGNOSIS — E039 Hypothyroidism, unspecified: Secondary | ICD-10-CM | POA: Diagnosis not present

## 2019-09-10 DIAGNOSIS — K644 Residual hemorrhoidal skin tags: Secondary | ICD-10-CM | POA: Insufficient documentation

## 2019-09-10 HISTORY — DX: Other bacterial infections of unspecified site: A49.8

## 2019-09-10 HISTORY — PX: COLONOSCOPY WITH PROPOFOL: SHX5780

## 2019-09-10 SURGERY — COLONOSCOPY WITH PROPOFOL
Anesthesia: General

## 2019-09-10 MED ORDER — PROPOFOL 10 MG/ML IV BOLUS
INTRAVENOUS | Status: DC | PRN
  Administered 2019-09-10: 70 mg via INTRAVENOUS

## 2019-09-10 MED ORDER — PROPOFOL 500 MG/50ML IV EMUL
INTRAVENOUS | Status: DC | PRN
  Administered 2019-09-10: 100 ug/kg/min via INTRAVENOUS

## 2019-09-10 MED ORDER — GLYCOPYRROLATE 0.2 MG/ML IJ SOLN
INTRAMUSCULAR | Status: DC | PRN
  Administered 2019-09-10: .2 mg via INTRAVENOUS

## 2019-09-10 MED ORDER — SODIUM CHLORIDE 0.9 % IV SOLN
INTRAVENOUS | Status: DC
  Administered 2019-09-10: 1000 mL via INTRAVENOUS

## 2019-09-10 MED ORDER — GLYCOPYRROLATE 0.2 MG/ML IJ SOLN
INTRAMUSCULAR | Status: AC
  Filled 2019-09-10: qty 1

## 2019-09-10 MED ORDER — PROPOFOL 10 MG/ML IV BOLUS
INTRAVENOUS | Status: AC
  Filled 2019-09-10: qty 20

## 2019-09-10 NOTE — Op Note (Signed)
[facility] Patient Name: Brooke Beasley Procedure Date: 09/10/2019 12:29 PM MRN: [id number] Account #: [id number] Date of Birth: Jan 02, 1931 Admit Type: Outpatient Age: 86 Room: [facility] ENDO ROOM 1 Gender: Female Note Status: Finalized Procedure:             Colonoscopy Indications:           Lower abdominal pain, Constipation Providers:             Lin Landsman MD, MD Medicines:             Monitored Anesthesia Care Complications:         No immediate complications. Estimated blood loss: None. Procedure:             Pre-Anesthesia Assessment:                        - Prior to the procedure, a History and Physical was                         performed, and patient medications and allergies were                         reviewed. The patient is competent. The risks and                         benefits of the procedure and the sedation options and                         risks were discussed with the patient. All questions                         were answered and informed consent was obtained.                         Patient identification and proposed procedure were                         verified by the physician, the nurse, the                         anesthesiologist, the anesthetist and the technician                         in the pre-procedure area in the procedure room in the                         endoscopy suite. Mental Status Examination: alert and                         oriented. Airway Examination: normal oropharyngeal                         airway and neck mobility. Respiratory Examination:                         clear to auscultation. CV Examination: normal.                         Prophylactic Antibiotics: The patient does not require  prophylactic antibiotics. Prior Anticoagulants: The                         patient has taken no previous anticoagulant or                         antiplatelet agents. ASA  Grade Assessment: III - A                         patient with severe systemic disease. After reviewing                         the risks and benefits, the patient was deemed in                         satisfactory condition to undergo the procedure. The                         anesthesia plan was to use monitored anesthesia care                         (MAC). Immediately prior to administration of                         medications, the patient was re-assessed for adequacy                         to receive sedatives. The heart rate, respiratory                         rate, oxygen saturations, blood pressure, adequacy of                         pulmonary ventilation, and response to care were                         monitored throughout the procedure. The physical                         status of the patient was re-assessed after the                         procedure.                        After obtaining informed consent, the colonoscope was                         passed under direct vision. Throughout the procedure,                         the patient's blood pressure, pulse, and oxygen                         saturations were monitored continuously. The was                         introduced through the anus and advanced to the the  cecum, identified by appendiceal orifice and ileocecal                         valve. The colonoscopy was unusually difficult due to                         multiple diverticula in the colon and significant                         looping. Successful completion of the procedure was                         aided by withdrawing the scope and replacing with the                         pediatric colonoscope and applying abdominal pressure.                         The patient tolerated the procedure well. The quality                         of the bowel preparation was good. Findings:      Hemorrhoids were found on perianal exam.       Multiple diverticula were found in the sigmoid colon.      Two sessile polyps were found in the ascending colon and cecum. The       polyps were 5 to 6 mm in size. These polyps were removed with a cold       snare. Resection and retrieval were complete.      Rectal mucosal prolapse was present.      Non-bleeding external hemorrhoids were found during retroflexion and       during perianal exam. The hemorrhoids were large. Impression:            - Hemorrhoids found on perianal exam.                        - Diverticulosis in the sigmoid colon.                        - Two 5 to 6 mm polyps in the ascending colon and in                         the cecum, removed with a cold snare. Resected and                         retrieved.                        - Rectal prolapse.                        - Non-bleeding external hemorrhoids. Recommendation:        - Discharge patient to home (with escort).                        - Resume previous diet and high fiber diet today.                        -  Continue present medications.                        - Await pathology results. Procedure Code(s):     --- Professional ---                        [phone], Colonoscopy, flexible; with removal of                         tumor(s), polyp(s), or other lesion(s) by snare                         technique Diagnosis Code(s):     --- Professional ---                        K64.4, Residual hemorrhoidal skin tags                        K63.5, Polyp of colon                        K62.3, Rectal prolapse                        R10.30, Lower abdominal pain, unspecified                        K59.00, Constipation, unspecified                        K57.30, Diverticulosis of large intestine without                         perforation or abscess without bleeding CPT copyright 2019 [employer]. All rights reserved. The codes documented in this report are preliminary and upon coder review may  be revised  to meet current compliance requirements. Dr. Ulyess Mort Lin Landsman MD, MD 09/10/2019 1:11:26 PM This report has been signed electronically. Number of Addenda: 0 Note Initiated On: 09/10/2019 12:29 PM Scope Withdrawal Time: 0 hours 12 minutes 8 seconds  Total Procedure Duration: 0 hours 21 minutes 54 seconds  Estimated Blood Loss:  Estimated blood loss: none.      [facility]

## 2019-09-10 NOTE — Anesthesia Preprocedure Evaluation (Signed)
Anesthesia Evaluation  Patient identified by MRN, date of birth, ID band Patient awake    Reviewed: Allergy & Precautions, NPO status , Patient's Chart, lab work & pertinent test results  History of Anesthesia Complications Negative for: history of anesthetic complications  Airway Mallampati: III  TM Distance: >3 FB Neck ROM: Full    Dental  (+) Upper Dentures, Lower Dentures   Pulmonary neg pulmonary ROS, neg sleep apnea, neg COPD, Patient abstained from smoking.Not current smoker,    Pulmonary exam normal breath sounds clear to auscultation       Cardiovascular Exercise Tolerance: Good METShypertension, (-) CAD and (-) Past MI (-) dysrhythmias  Rhythm:Regular Rate:Normal - Systolic murmurs    Neuro/Psych PSYCHIATRIC DISORDERS Anxiety Depression negative neurological ROS     GI/Hepatic hiatal hernia, GERD  Controlled,(+)     (-) substance abuse  ,   Endo/Other  neg diabetesHypothyroidism   Renal/GU Renal InsufficiencyRenal disease     Musculoskeletal   Abdominal   Peds  Hematology   Anesthesia Other Findings Past Medical History: No date: Anxiety 12/22/2017: CAP (community acquired pneumonia) 10/18/2014: Carotid stenosis     Comment:  R 1-39%, L 40-59%, rpt 1 yr (09/2014)  08/02/2014: CKD (chronic kidney disease) stage 3, GFR 30-59 ml/min No date: Clostridioides difficile infection     Comment:  hx 2020 No date: Degenerative disc disease     Comment:  LS No date: Depression     Comment:  nervous breakdonw in 1964-out of work for a year No date: Diverticulosis     Comment:  severe by colonoscopy No date: Family history of adverse reaction to anesthesia     Comment:  n/v No date: GERD (gastroesophageal reflux disease) No date: Glaucoma 5/13: Heart murmur     Comment:  mitral regurge - on echo  No date: History of hiatal hernia No date: History of shingles No date: HTN (hypertension) No date:  Hyperlipidemia No date: Hypertension No date: Hypothyroidism 11/02/2006: IBS No date: Intestinal bacterial overgrowth     Comment:  In small colon 04/08/2012: Maxillary fracture ([place]) 2016: Osteoarthritis     Comment:  Jefm Bryant) No date: Osteoporosis     Comment:  dexa 2011 2016: Pseudogout     Comment:  shoulders ([place]) No date: Pseudogout  Reproductive/Obstetrics                             Anesthesia Physical Anesthesia Plan  ASA: II  Anesthesia Plan: General   Post-op Pain Management:    Induction: Intravenous  PONV Risk Score and Plan: 3 and Ondansetron, Propofol infusion and TIVA  Airway Management Planned: Nasal Cannula  Additional Equipment: None  Intra-op Plan:   Post-operative Plan:   Informed Consent: I have reviewed the patients History and Physical, chart, labs and discussed the procedure including the risks, benefits and alternatives for the proposed anesthesia with the patient or authorized representative who has indicated his/her understanding and acceptance.     Dental advisory given  Plan Discussed with: CRNA and Surgeon  Anesthesia Plan Comments: (Discussed risks of anesthesia with patient, including possibility of difficulty with spontaneous ventilation under anesthesia necessitating airway intervention, PONV, and rare risks such as cardiac or respiratory or neurological events. Patient understands.)        Anesthesia Quick Evaluation

## 2019-09-10 NOTE — Anesthesia Postprocedure Evaluation (Signed)
Anesthesia Post Note  Patient: Brooke Beasley  Procedure(s) Performed: COLONOSCOPY WITH PROPOFOL (N/A )  Patient location during evaluation: Endoscopy Anesthesia Type: General Level of consciousness: awake and alert Pain management: pain level controlled Vital Signs Assessment: post-procedure vital signs reviewed and stable Respiratory status: spontaneous breathing, nonlabored ventilation, respiratory function stable and patient connected to nasal cannula oxygen Cardiovascular status: blood pressure returned to baseline and stable Postop Assessment: no apparent nausea or vomiting Anesthetic complications: no     Last Vitals:  Vitals:   09/10/19 1005 09/10/19 1312  BP: (!) 145/76 (!) 101/56  Pulse: (!) 58 60  Resp: 20 18  Temp: 37.1 C (!) 36.1 C  SpO2: 98% 95%    Last Pain:  Vitals:   09/10/19 1312  TempSrc: Temporal  PainSc:                  Arita Miss

## 2019-09-10 NOTE — H&P (Signed)
Cephas Darby, MD [address]  [place]  [place], [place] [address]  Main: [phone]  Fax: [phone] Pager: [phone]  Primary Care Physician:  [name], MD Primary Gastroenterologist:  Dr. Cephas Darby  Pre-Procedure History & Physical: HPI:  Brooke Beasley is a 86 y.o. female is here for an colonoscopy.   Past Medical History:  Diagnosis Date  . Anxiety   . CAP (community acquired pneumonia) 12/22/2017  . Carotid stenosis 10/18/2014   R 1-39%, L 40-59%, rpt 1 yr (09/2014)   . CKD (chronic kidney disease) stage 3, GFR 30-59 ml/min 08/02/2014  . Clostridioides difficile infection    hx 2020  . Degenerative disc disease    LS  . Depression    nervous breakdonw in 1964-out of work for a year  . Diverticulosis    severe by colonoscopy  . Family history of adverse reaction to anesthesia    n/v  . GERD (gastroesophageal reflux disease)   . Glaucoma   . Heart murmur 5/13   mitral regurge - on echo   . History of hiatal hernia   . History of shingles   . HTN (hypertension)   . Hyperlipidemia   . Hypertension   . Hypothyroidism   . IBS 11/02/2006  . Intestinal bacterial overgrowth    In small colon  . Maxillary fracture ([place]) 04/08/2012  . Osteoarthritis 2016   Jefm Bryant)  . Osteoporosis    dexa 2011  . Pseudogout 2016   shoulders (Poggi)  . Pseudogout     Past Surgical History:  Procedure Laterality Date  . barium enema  2012   severe diverticulosis, redundant colon  . Bowel obstruction  1999   no surgery in hosp x 3 days  . COLONOSCOPY  1. 1999  2. 11/04   1. Not finished  2. Slight hemorrhage rectosigmoid area, severe sig diverticulosis  . Dexa  1. [id number]   2. 9/04  3. 3/08    1. OP  2. OP, borderline, spine -2.44T  3. decreased BMD-OP  . DEXA  2011   OP in spine  . DG KNEE 1-2 VIEWS BILAT     LS x-ray with degenerative disc and facet change  . ESOPHAGOGASTRODUODENOSCOPY     Negative  . Hemorrhoid procedure  4/08  .  JOINT REPLACEMENT    . TONSILLECTOMY    . TOTAL SHOULDER ARTHROPLASTY Left 11/27/2015   Corky Mull, MD  . TOTAL SHOULDER REVISION Left 03/16/2016   Procedure: TOTAL SHOULDER REVISION;  Surgeon: Corky Mull, MD;  Location: [facility] ORS;  Service: Orthopedics;  Laterality: Left;  . US ECHOCARDIOGRAPHY  [id number]   Normal systolic fxn with EF 0000000.  Focal basal septal hypertrophy.  Mild diastolic dysfunction.  Mild MR.    Prior to Admission medications   Medication Sig Start Date End Date Taking? Authorizing Provider  amLODipine (NORVASC) 10 MG tablet Take 1 tablet by mouth once daily 11/20/18  Yes [name], MD  atenolol (TENORMIN) 25 MG tablet Take 1 tablet (25 mg total) by mouth daily. 08/29/19  Yes [name], MD  bimatoprost (LUMIGAN) 0.01 % SOLN Place 1 drop into both eyes at bedtime.   Yes [provider]  Bioflavonoid Products (VITAMIN C) CHEW Chew 1 tablet by mouth daily.   Yes [provider]  cholecalciferol (VITAMIN D) 1000 units tablet Take 1,000 Units by mouth daily.   Yes [provider]  clonazePAM (KLONOPIN) 0.5 MG tablet TAKE 1 TABLET BY MOUTH  AT BEDTIME AS NEEDED FOR ANXIETY 08/29/19  Yes [name], MD  colchicine 0.6 MG tablet Take 1 tablet (0.6 mg total) by mouth daily as needed (gout flare). 05/02/19  Yes [name], MD  denosumab (PROLIA) 60 MG/ML SOSY injection Inject 60 mg into the skin every 6 (six) months. 04/04/19  Yes [name], MD  EUTHYROX 100 MCG tablet Take 1 tablet by mouth once daily 06/04/19  Yes [name], MD  fish oil-omega-3 fatty acids 1000 MG capsule Take 1 g by mouth daily.    Yes [provider]  furosemide (LASIX) 20 MG tablet TAKE 1 TABLET BY MOUTH ONCE DAILY AS NEEDED FOR EDEMA 06/18/19  Yes [name], MD  HYDROcodone-acetaminophen (NORCO/VICODIN) 5-325 MG tablet Take 1-2 tablets by mouth every 6 (six) hours as needed. 03/18/19  Yes Davonna Belling, MD  hydrocortisone  (ANUSOL-HC) 2.5 % rectal cream Place 1 application rectally 2 (two) times daily. 10/23/18  Yes [name], MD  methocarbamol (ROBAXIN) 500 MG tablet Take 1 tablet (500 mg total) by mouth every 8 (eight) hours as needed for muscle spasms. 03/18/19  Yes Davonna Belling, MD  Misc Natural Products (OSTEO BI-FLEX TRIPLE STRENGTH PO) Take 1 tablet by mouth daily. With Vit D   Yes [provider]  omeprazole (PRILOSEC) 20 MG capsule Take 1 capsule by mouth daily 04/24/19  Yes [name], MD  potassium chloride (KLOR-CON) 10 MEQ tablet TAKE 1 TABLET BY MOUTH ONCE DAILY AS NEEDED (WITH  LASIX  (FUROSEMIDE)) 08/29/19  Yes [name], MD  sertraline (ZOLOFT) 100 MG tablet Take 1 tablet by mouth daily 04/24/19  Yes [name], MD  TURMERIC PO Take 1 tablet by mouth daily.   Yes [provider]  VITAMIN E PO Take 1 tablet by mouth daily.   Yes [provider]  VOLTAREN 1 % GEL APPLY ONE APPLICATION TOPICALLY 3 TIMES DAILY Patient taking differently: APPLY ONE APPLICATION TOPICALLY daily as needed for shoulder pain 09/26/14  Yes [name], MD  acetaminophen (TYLENOL) 500 MG tablet Take 500-1,000 mg by mouth at bedtime. Depends on pain    [provider]  albuterol (PROVENTIL HFA;VENTOLIN HFA) 108 (90 Base) MCG/ACT inhaler Inhale 2 puffs into the lungs every 6 (six) hours as needed for wheezing or shortness of breath. 01/12/18   [name], MD    Allergies as of 08/21/2019 - Review Complete 08/21/2019  Allergen Reaction Noted  . Prednisone Other (See Comments) 11/12/2015  . Ace inhibitors Other (See Comments) 04/02/2008  . Alendronate sodium  09/14/2006  . Losartan Other (See Comments) 09/26/2014  . Other Other (See Comments) 11/12/2015  . Paroxetine hcl Other (See Comments) 06/15/2011  . Risedronate sodium  03/13/2007  . Silenor [doxepin hcl] Other (See Comments) 04/02/2013  . Simvastatin  10/03/2009  . Sulfonamide derivatives   06/08/2006  . Toprol xl [metoprolol] Diarrhea 08/03/2019  . Tramadol Other (See Comments) 11/12/2015  . Influenza vaccines Rash and Other (See Comments) 05/24/2014    Family History  Problem Relation Age of Onset  . Diabetes Brother        post-op  . Coronary artery disease Brother   . Diabetes Paternal Grandfather   . Breast cancer Cousin     Social History   Socioeconomic History  . Marital status: Married    Spouse name: Not on file  . Number of children: 2  . Years of education: Not on file  . Highest education level: Not on file  Occupational History  .  Occupation: Retired    Fish farm manager: RETIRED  Tobacco Use  . Smoking status: Never Smoker  . Smokeless tobacco: Never Used  Substance and Sexual Activity  . Alcohol use: No  . Drug use: No  . Sexual activity: Not Currently  Other Topics Concern  . Not on file  Social History Narrative   Left handed   Widow. Husband ([name]) deceased 24-Mar-2016 from dementia. She was caregiver.    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 Difficulty of Paying Living Expenses:   Food Insecurity:   . Worried About Charity fundraiser in the Last Year:   . Arboriculturist in the Last Year:   Transportation Needs:   . Film/video editor (Medical):   Marland Kitchen Lack of Transportation (Non-Medical):   Physical Activity:   . Days of Exercise per Week:   . Minutes of Exercise per Session:   Stress:   . Feeling of Stress :   Social Connections:   . Frequency of Communication with Friends and Family:   . Frequency of Social Gatherings with Friends and Family:   . Attends Religious Services:   . Active Member of Clubs or Organizations:   . Attends Archivist Meetings:   Marland Kitchen Marital Status:   Intimate Partner Violence:   .  Fear of Current or Ex-Partner:   . Emotionally Abused:   Marland Kitchen Physically Abused:   . Sexually Abused:     Review of Systems: See HPI, otherwise negative ROS  Physical Exam: BP (!) 145/76   Pulse (!) 58   Temp 98.8 F (37.1 C) (Temporal)   Resp 20   Ht 5\' 1"  (1.549 m)   Wt 73 kg   SpO2 98%   BMI 30.42 kg/m  General:   Alert,  pleasant and cooperative in NAD Head:  Normocephalic and atraumatic. Neck:  Supple; no masses or thyromegaly. Lungs:  Clear throughout to auscultation.    Heart:  Regular rate and rhythm. Abdomen:  Soft, nontender and nondistended. Normal bowel sounds, without guarding, and without rebound.   Neurologic:  Alert and  oriented x4;  grossly normal neurologically.  Impression/Plan: Brooke Beasley is here for an colonoscopy to be performed for constipation associated with mucus discharge per rectum and overflow diarrhea, lower abdominal discomfort  Risks, benefits, limitations, and alternatives regarding  colonoscopy have been reviewed with the patient.  Questions have been answered.  All parties agreeable.   Sherri Sear, MD  09/10/2019, 12:02 PM

## 2019-09-10 NOTE — Transfer of Care (Signed)
Immediate Anesthesia Transfer of Care Note  Patient: Brooke Beasley  Procedure(s) Performed: COLONOSCOPY WITH PROPOFOL (N/A )  Patient Location: Endoscopy Unit  Anesthesia Type:General  Level of Consciousness: drowsy and patient cooperative  Airway & Oxygen Therapy: Patient Spontanous Breathing and Patient connected to nasal cannula oxygen  Post-op Assessment: Report given to RN and Post -op Vital signs reviewed and stable  Post vital signs: Reviewed and stable  Last Vitals:  Vitals Value Taken Time  BP 101/56 09/10/19 1313  Temp 36.1 C 09/10/19 1312  Pulse 58 09/10/19 1315  Resp 14 09/10/19 1315  SpO2 96 % 09/10/19 1315  Vitals shown include unvalidated device data.  Last Pain:  Vitals:   09/10/19 1312  TempSrc: Temporal  PainSc:          Complications: No apparent anesthesia complications

## 2019-09-11 ENCOUNTER — Encounter: Payer: Self-pay | Admitting: Gastroenterology

## 2019-09-11 ENCOUNTER — Encounter: Payer: Self-pay | Admitting: Family Medicine

## 2019-09-11 DIAGNOSIS — I10 Essential (primary) hypertension: Secondary | ICD-10-CM

## 2019-09-11 LAB — SURGICAL PATHOLOGY

## 2019-09-12 ENCOUNTER — Encounter: Payer: Self-pay | Admitting: Gastroenterology

## 2019-09-12 ENCOUNTER — Encounter: Payer: Self-pay | Admitting: *Deleted

## 2019-09-13 ENCOUNTER — Encounter: Payer: Self-pay | Admitting: Gastroenterology

## 2019-09-13 NOTE — Telephone Encounter (Signed)
Sent referral to [facility]  

## 2019-09-14 MED ORDER — SPIRONOLACTONE 25 MG PO TABS: 12.5000 mg | ORAL_TABLET | Freq: Every day | ORAL | 1 refills | Status: DC

## 2019-09-17 NOTE — Telephone Encounter (Signed)
Patient tried to get the Spironolactone at [facility].  [facility] told patient that they needed to hear from Dr.G before they would fill medication.  They said the medication counteracted with another medication she's taking. Patient can be reached at [phone].

## 2019-09-18 ENCOUNTER — Ambulatory Visit
Admission: EM | Admit: 2019-09-18 | Discharge: 2019-09-18 | Disposition: A | Discharge status: Home / Self Care | Payer: PPO | Attending: Family Medicine | Admitting: Family Medicine

## 2019-09-18 DIAGNOSIS — R059 Cough, unspecified: Secondary | ICD-10-CM

## 2019-09-18 DIAGNOSIS — R05 Cough: Secondary | ICD-10-CM | POA: Diagnosis not present

## 2019-09-18 DIAGNOSIS — J069 Acute upper respiratory infection, unspecified: Secondary | ICD-10-CM

## 2019-09-18 DIAGNOSIS — J029 Acute pharyngitis, unspecified: Secondary | ICD-10-CM

## 2019-09-18 DIAGNOSIS — R0981 Nasal congestion: Secondary | ICD-10-CM

## 2019-09-18 LAB — POC SARS CORONAVIRUS 2 AG -  ED: SARS Coronavirus 2 Ag: NEGATIVE

## 2019-09-18 MED ORDER — PROMETHAZINE-PHENYLEPHRINE 6.25-5 MG/5ML PO SYRP
5.0000 mL | ORAL_SOLUTION | ORAL | 0 refills | Status: DC | PRN
Start: 2019-09-18 — End: 2021-05-06

## 2019-09-18 MED ORDER — AZITHROMYCIN 250 MG PO TABS
250.0000 mg | ORAL_TABLET | Freq: Every day | ORAL | 0 refills | Status: DC
Start: 2019-09-18 — End: 2020-01-07

## 2019-09-18 NOTE — ED Provider Notes (Signed)
[place]   [id number] 09/18/19 Arrival Time: 1115   CC: COVID symptoms  SUBJECTIVE: History from: patient.  Brooke Beasley is a 86 y.o. female who presents with abrupt onset of nasal congestion, PND, and persistent dry cough for the last 3 days.  Denies sick exposure to COVID, flu or strep.  Denies recent travel. Has tried Allegra without relief.  There are no aggravating symptoms. Reports previous symptoms in the past. She has had pneumonia before and is concerned about this.    Denies fever, chills, fatigue, sinus pain, SOB, wheezing, chest pain, nausea, changes in bowel or bladder habits.    ROS: As per HPI.  All other pertinent ROS negative.     Past Medical History:  Diagnosis Date  . Anxiety   . CAP (community acquired pneumonia) 12/22/2017  . Carotid stenosis 10/18/2014   R 1-39%, L 40-59%, rpt 1 yr (09/2014)   . CKD (chronic kidney disease) stage 3, GFR 30-59 ml/min 08/02/2014  . Clostridioides difficile infection    hx 2020  . Degenerative disc disease    LS  . Depression    nervous breakdonw in 1964-out of work for a year  . Diverticulosis    severe by colonoscopy  . Family history of adverse reaction to anesthesia    n/v  . GERD (gastroesophageal reflux disease)   . Glaucoma   . Heart murmur 5/13   mitral regurge - on echo   . History of hiatal hernia   . History of shingles   . HTN (hypertension)   . Hyperlipidemia   . Hypertension   . Hypothyroidism   . IBS 11/02/2006  . Intestinal bacterial overgrowth    In small colon  . Maxillary fracture ([place]) 04/08/2012  . Osteoarthritis 2016   Jefm Bryant)  . Osteoporosis    dexa 2011  . Pseudogout 2016   shoulders (Poggi)  . Pseudogout    Past Surgical History:  Procedure Laterality Date  . barium enema  2012   severe diverticulosis, redundant colon  . Bowel obstruction  1999   no surgery in hosp x 3 days  . COLONOSCOPY  1. 1999  2. 11/04   1. Not finished  2. Slight hemorrhage  rectosigmoid area, severe sig diverticulosis  . COLONOSCOPY WITH PROPOFOL N/A 09/10/2019   SSP with dysplasia, TA (Vanga, Tally Due, MD)  . Dexa  1. [id number]   2. 9/04  3. 3/08    1. OP  2. OP, borderline, spine -2.44T  3. decreased BMD-OP  . DEXA  2011   OP in spine  . DG KNEE 1-2 VIEWS BILAT     LS x-ray with degenerative disc and facet change  . ESOPHAGOGASTRODUODENOSCOPY     Negative  . Hemorrhoid procedure  4/08  . JOINT REPLACEMENT    . TONSILLECTOMY    . TOTAL SHOULDER ARTHROPLASTY Left 11/27/2015   Corky Mull, MD  . TOTAL SHOULDER REVISION Left 03/16/2016   Procedure: TOTAL SHOULDER REVISION;  Surgeon: Corky Mull, MD;  Location: [facility] ORS;  Service: Orthopedics;  Laterality: Left;  . US ECHOCARDIOGRAPHY  [id number]   Normal systolic fxn with EF 76-54%.  Focal basal septal hypertrophy.  Mild diastolic dysfunction.  Mild MR.   Allergies  Allergen Reactions  . Prednisone Other (See Comments)    Heart racing, anxiety --> clarified no anaphylaxis to prednisone. Tolerated prednisone 02/2019 without issue  . Ace Inhibitors Other (See Comments)    cough  . Alendronate Sodium  REACTION: palpitations  . Losartan Other (See Comments)    Cr bumped  . Other Other (See Comments)    No seeds or corn because of IBS  . Paroxetine Hcl Other (See Comments)    Not effective - felt ill on this medicine  . Risedronate Sodium     REACTION: joint pain  . Silenor [Doxepin Hcl] Other (See Comments)    Hallucinations and racing heart  . Simvastatin     REACTION: the full 20 mg pill causes leg pain- can tol 10 mg  . Sulfonamide Derivatives     REACTION: rash  . Toprol Xl [Metoprolol] Diarrhea    Diarrhea and weakness  . Tramadol Other (See Comments)    Felt like passing out  . Influenza Vaccines Rash and Other (See Comments)    Led to mental breakdown in 1960s    No current facility-administered medications on file prior to encounter.   Current Outpatient Medications on File Prior  to Encounter  Medication Sig Dispense Refill  . acetaminophen (TYLENOL) 500 MG tablet Take 500-1,000 mg by mouth at bedtime. Depends on pain    . albuterol (PROVENTIL HFA;VENTOLIN HFA) 108 (90 Base) MCG/ACT inhaler Inhale 2 puffs into the lungs every 6 (six) hours as needed for wheezing or shortness of breath. 1 Inhaler 0  . amLODipine (NORVASC) 10 MG tablet Take 1 tablet by mouth once daily 90 tablet 3  . bimatoprost (LUMIGAN) 0.01 % SOLN Place 1 drop into both eyes at bedtime.    . Bioflavonoid Products (VITAMIN C) CHEW Chew 1 tablet by mouth daily.    . cholecalciferol (VITAMIN D) 1000 units tablet Take 1,000 Units by mouth daily.    . clonazePAM (KLONOPIN) 0.5 MG tablet TAKE 1 TABLET BY MOUTH AT BEDTIME AS NEEDED FOR ANXIETY 30 tablet 0  . colchicine 0.6 MG tablet Take 1 tablet (0.6 mg total) by mouth daily as needed (gout flare). 90 tablet 0  . denosumab (PROLIA) 60 MG/ML SOSY injection Inject 60 mg into the skin every 6 (six) months. 1 mL 1  . EUTHYROX 100 MCG tablet Take 1 tablet by mouth once daily 90 tablet 1  . fish oil-omega-3 fatty acids 1000 MG capsule Take 1 g by mouth daily.     . furosemide (LASIX) 20 MG tablet TAKE 1 TABLET BY MOUTH ONCE DAILY AS NEEDED FOR EDEMA 90 tablet 1  . HYDROcodone-acetaminophen (NORCO/VICODIN) 5-325 MG tablet Take 1-2 tablets by mouth every 6 (six) hours as needed. 6 tablet 0  . hydrocortisone (ANUSOL-HC) 2.5 % rectal cream Place 1 application rectally 2 (two) times daily. 30 g 0  . methocarbamol (ROBAXIN) 500 MG tablet Take 1 tablet (500 mg total) by mouth every 8 (eight) hours as needed for muscle spasms. 8 tablet 0  . Misc Natural Products (OSTEO BI-FLEX TRIPLE STRENGTH PO) Take 1 tablet by mouth daily. With Vit D    . omeprazole (PRILOSEC) 20 MG capsule Take 1 capsule by mouth daily 90 capsule 2  . potassium chloride (KLOR-CON) 10 MEQ tablet TAKE 1 TABLET BY MOUTH ONCE DAILY AS NEEDED (WITH  LASIX  (FUROSEMIDE)) 90 tablet 0  . sertraline (ZOLOFT) 100  MG tablet Take 1 tablet by mouth daily 90 tablet 2  . spironolactone (ALDACTONE) 25 MG tablet Take 0.5 tablets (12.5 mg total) by mouth daily. 15 tablet 1  . TURMERIC PO Take 1 tablet by mouth daily.    . VITAMIN E PO Take 1 tablet by mouth daily.    .   VOLTAREN 1 % GEL APPLY ONE APPLICATION TOPICALLY 3 TIMES DAILY (Patient taking differently: APPLY ONE APPLICATION TOPICALLY daily as needed for shoulder pain) 100 g 3   Social History   Socioeconomic History  . Marital status: Married    Spouse name: Not on file  . Number of children: 2  . Years of education: Not on file  . Highest education level: Not on file  Occupational History  . Occupation: Retired    Fish farm manager: RETIRED  Tobacco Use  . Smoking status: Never Smoker  . Smokeless tobacco: Never Used  Substance and Sexual Activity  . Alcohol use: No  . Drug use: No  . Sexual activity: Not Currently  Other Topics Concern  . Not on file  Social History Narrative   Left handed   Widow. Husband ([name]) deceased 11-Apr-2016 from dementia. She was caregiver.    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 Difficulty of Paying Living Expenses:   Food Insecurity:   . Worried About Charity fundraiser in the Last Year:   . Arboriculturist in the Last Year:   Transportation Needs:   . Film/video editor (Medical):   Marland Kitchen Lack of Transportation (Non-Medical):   Physical Activity:   . Days of Exercise per Week:   . Minutes of Exercise per Session:   Stress:   . Feeling of Stress :   Social Connections:   . Frequency of Communication with Friends and Family:   . Frequency of Social Gatherings with Friends and Family:   . Attends Religious Services:   . Active Member of Clubs or Organizations:   . Attends Archivist  Meetings:   Marland Kitchen Marital Status:   Intimate Partner Violence:   . Fear of Current or Ex-Partner:   . Emotionally Abused:   Marland Kitchen Physically Abused:   . Sexually Abused:    Family History  Problem Relation Age of Onset  . Diabetes Brother        post-op  . Coronary artery disease Brother   . Diabetes Paternal Grandfather   . Breast cancer Cousin     OBJECTIVE:  Vitals:   09/18/19 1118  BP: (!) 146/73  Pulse: 78  Resp: 20  Temp: 98.8 F (37.1 C)  TempSrc: Oral  SpO2: 95%     General appearance: alert; appears fatigued, but nontoxic; speaking in full sentences and tolerating own secretions HEENT: NCAT; Ears: EACs clear, TMs pearly gray; Eyes: PERRL.  EOM grossly intact. Sinuses: nontender; Nose: nares patent without rhinorrhea, Throat: oropharynx clear, tonsils non erythematous or enlarged, uvula midline  Neck: supple without LAD Lungs: unlabored respirations, symmetrical air entry; cough: mild; no respiratory distress; CTAB Heart: regular rate and rhythm.  Radial pulses 2+ symmetrical bilaterally Skin: warm and dry Psychological: alert and cooperative; normal mood and affect  LABS:  Results for orders placed or performed during the hospital encounter of 09/18/19 (from the past 24 hour(s))  POC SARS Coronavirus 2 Ag-ED - Nasal Swab (BD Veritor Kit)     Status: None   Collection Time: 09/18/19 11:27 AM  Result Value Ref Range   SARS Coronavirus 2 Ag Negative Negative     ASSESSMENT & PLAN:  1. Sore throat   2. Nasal congestion  3. Cough     Meds ordered this encounter  Medications  . promethazine-phenylephrine (PROMETHAZINE VC) 6.25-5 MG/5ML SYRP    Sig: Take 5 mLs by mouth every 4 (four) hours as needed for congestion.    Dispense:  118 mL    Refill:  0    Order Specific Question:   Supervising Provider    Answer:   Chase Picket [id number]      COVID testing ordered.  It will take between 1-2 days for test results.  Someone will contact you regarding  abnormal results.    Patient should remain in quarantine until they have received Covid results.  If negative you may resume normal activities (go back to work/school) while practicing hand hygiene, social distance, and mask wearing.  If positive, patient should remain in quarantine for 10 days from symptom onset AND greater than 72 hours after symptoms resolution (absence of fever without the use of fever-reducing medication and improvement in respiratory symptoms), whichever is longer  Get plenty of rest and push fluids Use OTC zyrtec for nasal congestion, runny nose, and/or sore throat Use OTC flonase for nasal congestion and runny nose Use medications daily for symptom relief Use OTC medications like ibuprofen or tylenol as needed fever or pain Call or go to the ED if you have any new or worsening symptoms such as fever, worsening cough, shortness of breath, chest tightness, chest pain, turning blue, changes in mental status.  Reviewed expectations re: course of current medical issues. Questions answered. Outlined signs and symptoms indicating need for more acute intervention. Patient verbalized understanding. After Visit Summary given.         Faustino Congress, NP 09/18/19 1154

## 2019-09-18 NOTE — Telephone Encounter (Signed)
Spoke with pharmacist, at [facility], about pt's spironolactone rx.  Says there was an alert that came up showing rx can increase potassium levels and they show pt is taking potassium.  I relayed Dr. Synthia Innocent message and that he is monitoring her levels.  Pharmacist verbalizes understanding and will fill for pt.   Spoke with pt notifying her [facility] is filling the spironolactone.  Scheduled lab visit on 09/26/19 at 11:00.

## 2019-09-18 NOTE — ED Triage Notes (Signed)
Patient reports she started with a sore throat late last week. Reports that led to chest congestion, and now she has head congestion and headache.

## 2019-09-18 NOTE — Discharge Instructions (Addendum)
Your rapid Covid test is negative.    Continue with your Allegra.  You may take the cough syrup that I sent to your pharmacy every 4 hours as needed for cough and congestion. This can make you a bit sleepy as well.  I have also sent in azithromycin for you to start on Thursday if you are not feeling better. Do NOT take this medication with your Colchicine.  Follow up in 2-3 days if you are not improving.

## 2019-09-21 ENCOUNTER — Encounter: Payer: Self-pay | Admitting: Family Medicine

## 2019-09-26 ENCOUNTER — Other Ambulatory Visit (INDEPENDENT_AMBULATORY_CARE_PROVIDER_SITE_OTHER): Payer: PPO

## 2019-09-26 DIAGNOSIS — I10 Essential (primary) hypertension: Secondary | ICD-10-CM

## 2019-09-26 LAB — BASIC METABOLIC PANEL
BUN: 21 mg/dL (ref 6–23)
CO2: 28 mEq/L (ref 19–32)
Calcium: 8.9 mg/dL (ref 8.4–10.5)
Chloride: 102 mEq/L (ref 96–112)
Creatinine, Ser: 0.95 mg/dL (ref 0.40–1.20)
GFR: 55.66 mL/min — ABNORMAL LOW (ref >60.00)
Glucose, Bld: 120 mg/dL — ABNORMAL HIGH (ref 70–99)
Potassium: 3.9 mEq/L (ref 3.5–5.1)
Sodium: 136 mEq/L (ref 135–145)

## 2019-09-28 ENCOUNTER — Other Ambulatory Visit: Payer: Self-pay | Admitting: Family Medicine

## 2019-09-28 NOTE — Telephone Encounter (Signed)
Name of Medication: Clonazepam Name of Pharmacy: [facility] or Written Date and Quantity: 08/29/19, #30 Last Office Visit and Type: 08/29/19, BP issue; 10/23/18, AWV prt 2 Next Office Visit and Type: none Last Controlled Substance Agreement Date: 09/26/14 Last UDS: 09/26/14

## 2019-09-28 NOTE — Telephone Encounter (Signed)
ERx 

## 2019-10-01 ENCOUNTER — Other Ambulatory Visit: Payer: Self-pay | Admitting: Family Medicine

## 2019-10-01 MED ORDER — SPIRONOLACTONE 25 MG PO TABS: 25.0000 mg | ORAL_TABLET | Freq: Every day | ORAL | 6 refills | Status: DC

## 2019-10-02 DIAGNOSIS — K623 Rectal prolapse: Secondary | ICD-10-CM | POA: Diagnosis not present

## 2019-10-02 DIAGNOSIS — N3946 Mixed incontinence: Secondary | ICD-10-CM | POA: Diagnosis not present

## 2019-10-02 DIAGNOSIS — N819 Female genital prolapse, unspecified: Secondary | ICD-10-CM | POA: Diagnosis not present

## 2019-10-05 ENCOUNTER — Telehealth: Payer: Self-pay

## 2019-10-05 NOTE — Telephone Encounter (Signed)
Last Prolia given 03-19-19.  Pt's daughter, Brooke Beasley, stated (for last inj), it was cheaper to get from pharmacy ([facility]) and was shipped to [place].   Both medical and pharmacy benefits were run 10-03-19.  Of pt uses medical benefit, pt would owe approx $260.  ($240 for Prolia - 20% and $20 for admin fee).  If pt uses pharmacy benefit, pt would pay approx $180@pharmacy  and owe for admin fee.  Using pharmacy benefit would put pt in "donut hole".

## 2019-10-07 ENCOUNTER — Encounter: Payer: Self-pay | Admitting: Gastroenterology

## 2019-10-07 ENCOUNTER — Encounter: Payer: Self-pay | Admitting: Family Medicine

## 2019-10-11 NOTE — Telephone Encounter (Signed)
Last Prolia inj was 05/02/19 and not 03/19/19. Pt is due again after 10/31/19  Labs from 09/26/19 Ca level is normal and CrCl is 48.20mL/min.  LVM

## 2019-10-12 NOTE — Telephone Encounter (Signed)
Ok to use recent labs from 09/2019. Thanks.

## 2019-10-15 ENCOUNTER — Encounter: Payer: Self-pay | Admitting: Family Medicine

## 2019-10-15 NOTE — Telephone Encounter (Signed)
Advised pt she could have inj. She is going to talk to her daughter about the cost and then f/u with this office.

## 2019-10-15 NOTE — Telephone Encounter (Signed)
Pt has been taking BP meds as directed. She reports BP readings have been lower. She will turn them in again next week.

## 2019-10-16 NOTE — Telephone Encounter (Signed)
Printed readings placed in Dr. Synthia Innocent box.

## 2019-10-17 ENCOUNTER — Telehealth: Payer: Self-pay | Admitting: Family Medicine

## 2019-10-17 NOTE — Telephone Encounter (Signed)
Paitent came by the office today and dropped of paperwork that she needed filled out   Looks like it is for Prolia Patient Assistance , it has been placed on [place], [place]

## 2019-10-18 ENCOUNTER — Other Ambulatory Visit: Payer: Self-pay

## 2019-10-18 DIAGNOSIS — M810 Age-related osteoporosis without current pathological fracture: Secondary | ICD-10-CM

## 2019-10-18 MED ORDER — DENOSUMAB 60 MG/ML ~~LOC~~ SOSY: 60.0000 mg | PREFILLED_SYRINGE | SUBCUTANEOUS | 1 refills | Status: DC

## 2019-10-18 NOTE — Progress Notes (Signed)
Printed prolia script for pt assistance program.

## 2019-10-18 NOTE — Telephone Encounter (Signed)
Prolia paperwork completed and script printed.  Waiting provider signature.   Paperwork completed and script printed.  Faxed all to [phone]

## 2019-10-23 ENCOUNTER — Encounter: Payer: Self-pay | Admitting: Gastroenterology

## 2019-10-23 ENCOUNTER — Encounter: Payer: Self-pay | Admitting: Family Medicine

## 2019-10-23 DIAGNOSIS — H409 Unspecified glaucoma: Secondary | ICD-10-CM | POA: Diagnosis not present

## 2019-10-23 DIAGNOSIS — E039 Hypothyroidism, unspecified: Secondary | ICD-10-CM | POA: Diagnosis not present

## 2019-10-23 DIAGNOSIS — Z01818 Encounter for other preprocedural examination: Secondary | ICD-10-CM | POA: Diagnosis not present

## 2019-10-23 DIAGNOSIS — Z01810 Encounter for preprocedural cardiovascular examination: Secondary | ICD-10-CM | POA: Diagnosis not present

## 2019-10-23 DIAGNOSIS — N1831 Chronic kidney disease, stage 3a: Secondary | ICD-10-CM | POA: Diagnosis not present

## 2019-10-23 DIAGNOSIS — I10 Essential (primary) hypertension: Secondary | ICD-10-CM | POA: Diagnosis not present

## 2019-10-23 DIAGNOSIS — K219 Gastro-esophageal reflux disease without esophagitis: Secondary | ICD-10-CM | POA: Diagnosis not present

## 2019-10-23 DIAGNOSIS — K623 Rectal prolapse: Secondary | ICD-10-CM | POA: Diagnosis not present

## 2019-10-23 DIAGNOSIS — R011 Cardiac murmur, unspecified: Secondary | ICD-10-CM

## 2019-10-26 DIAGNOSIS — H353131 Nonexudative age-related macular degeneration, bilateral, early dry stage: Secondary | ICD-10-CM | POA: Diagnosis not present

## 2019-10-26 DIAGNOSIS — H04123 Dry eye syndrome of bilateral lacrimal glands: Secondary | ICD-10-CM | POA: Diagnosis not present

## 2019-10-26 DIAGNOSIS — H401131 Primary open-angle glaucoma, bilateral, mild stage: Secondary | ICD-10-CM | POA: Diagnosis not present

## 2019-10-26 DIAGNOSIS — D3132 Benign neoplasm of left choroid: Secondary | ICD-10-CM | POA: Diagnosis not present

## 2019-10-27 ENCOUNTER — Other Ambulatory Visit: Payer: Self-pay | Admitting: Family Medicine

## 2019-10-27 NOTE — Telephone Encounter (Signed)
Last filled 09-28-19 #30 Last OV 08-29-19 No Future [place]

## 2019-10-27 NOTE — Telephone Encounter (Signed)
ERx 

## 2019-10-30 ENCOUNTER — Ambulatory Visit (INDEPENDENT_AMBULATORY_CARE_PROVIDER_SITE_OTHER): Payer: PPO | Admitting: Family Medicine

## 2019-10-30 ENCOUNTER — Encounter: Payer: Self-pay | Admitting: Family Medicine

## 2019-10-30 ENCOUNTER — Other Ambulatory Visit: Payer: Self-pay

## 2019-10-30 VITALS — BP 150/76 | HR 71 | Temp 97.8°F | Ht 61.0 in | Wt 156.4 lb

## 2019-10-30 DIAGNOSIS — N1831 Chronic kidney disease, stage 3a: Secondary | ICD-10-CM | POA: Diagnosis not present

## 2019-10-30 DIAGNOSIS — K623 Rectal prolapse: Secondary | ICD-10-CM | POA: Diagnosis not present

## 2019-10-30 DIAGNOSIS — I10 Essential (primary) hypertension: Secondary | ICD-10-CM

## 2019-10-30 DIAGNOSIS — R011 Cardiac murmur, unspecified: Secondary | ICD-10-CM | POA: Diagnosis not present

## 2019-10-30 DIAGNOSIS — R0789 Other chest pain: Secondary | ICD-10-CM | POA: Insufficient documentation

## 2019-10-30 MED ORDER — LOSARTAN POTASSIUM 25 MG PO TABS: 25.0000 mg | ORAL_TABLET | Freq: Every day | ORAL | 3 refills | Status: DC

## 2019-10-30 NOTE — Assessment & Plan Note (Signed)
Has become more harsh sounding.  Previously presumed aortic sclerosis based on prior echocardiogram 2013.  Will update echo

## 2019-10-30 NOTE — Assessment & Plan Note (Addendum)
Recent episode was associated with back pain that improved after bowel movement suggesting GI etiology. Check EGK today. If recurrent or abnormal Echo, consider cardiology eval.

## 2019-10-30 NOTE — Telephone Encounter (Signed)
Pt does not qualify for financial assistance from [place]. Pt would like to proceed with inj and pay out of pocket for her part of cost of prolia. Pt will have lab apt on 7/13 and this nurse will call after those results to schedule for inj.

## 2019-10-30 NOTE — Progress Notes (Signed)
This visit was conducted in person.  BP (!) 150/76 (BP Location: Left Arm, Patient Position: Sitting, Cuff Size: Normal)   Pulse 71   Temp 97.8 F (36.6 C) (Temporal)   Ht 5\' 1"  (1.549 m)   Wt 156 lb 6 oz (70.9 kg)   SpO2 97%   BMI 29.55 kg/m   BP Readings from Last 3 Encounters:  10/30/19 (!) 150/76  09/18/19 (!) 146/73  09/10/19 136/78    CC: HTN f/u visit  Subjective:    Patient ID: Brooke Beasley, female    DOB: 05-27-1930, 86 y.o.   MRN: [id number]  HPI: Brooke Beasley is a 86 y.o. female presenting on 10/30/2019 for Hypertension (C/o higher BP readings than usual. Last BP was 182/82, on 10/26/19- 188/99, all taken in the evening.  Pt started taking amlodipine at dinner time, not helpful.  Pt accompanied by daughter, [name]- temp 97.7.) and Orders (Asking about ECHO order. )   HTN - Compliant with current antihypertensive regimen of amlodipine 10mg  nightly, spironolactone 25mg  daily, lasix 20mg  daily. Changing amlodipine to night time seemed to help a few days then BP started trending up again. Does check blood pressures at home and brings log: 118-180/60-90, HR 60-80 (see recent mychart messages). No low blood pressure readings or symptoms of syncope. Denies vision changes, leg swelling.  Multiple drug intolerances - ACEI, losartan, toprol XL. Occasional headache and chest discomfort with dyspnea.   Episode Sunday of mid back pain associated with fatigue, weakness, chest discomfort. Felt better after bowel movement.   Planning rectal prolapse surgery - pending results of echocardiogram.      Relevant past medical, surgical, family and social history reviewed and updated as indicated. Interim medical history since our last visit reviewed. Allergies and medications reviewed and updated. Outpatient Medications Prior to Visit  Medication Sig Dispense Refill  . acetaminophen (TYLENOL) 500 MG tablet Take 500-1,000 mg by mouth at bedtime. Depends on pain    .  albuterol (PROVENTIL HFA;VENTOLIN HFA) 108 (90 Base) MCG/ACT inhaler Inhale 2 puffs into the lungs every 6 (six) hours as needed for wheezing or shortness of breath. 1 Inhaler 0  . amLODipine (NORVASC) 10 MG tablet Take 1 tablet by mouth once daily 90 tablet 3  . azithromycin (ZITHROMAX) 250 MG tablet Take 1 tablet (250 mg total) by mouth daily. Take first 2 tablets together, then 1 every day until finished. 6 tablet 0  . bimatoprost (LUMIGAN) 0.01 % SOLN Place 1 drop into both eyes at bedtime.    Marland Kitchen Bioflavonoid Products (VITAMIN C) CHEW Chew 1 tablet by mouth daily.    . cholecalciferol (VITAMIN D) 1000 units tablet Take 1,000 Units by mouth daily.    . clonazePAM (KLONOPIN) 0.5 MG tablet TAKE 1 TABLET BY MOUTH AT BEDTIME AS NEEDED FOR ANXIETY 30 tablet 0  . colchicine 0.6 MG tablet Take 1 tablet (0.6 mg total) by mouth daily as needed (gout flare). 90 tablet 0  . denosumab (PROLIA) 60 MG/ML SOSY injection Inject 60 mg into the skin every 6 (six) months. 1 mL 1  . EUTHYROX 100 MCG tablet Take 1 tablet by mouth once daily 90 tablet 1  . fish oil-omega-3 fatty acids 1000 MG capsule Take 1 g by mouth daily.     . furosemide (LASIX) 20 MG tablet TAKE 1 TABLET BY MOUTH ONCE DAILY AS NEEDED FOR EDEMA 90 tablet 1  . HYDROcodone-acetaminophen (NORCO/VICODIN) 5-325 MG tablet Take 1-2 tablets by mouth every 6 (six) hours  as needed. 6 tablet 0  . hydrocortisone (ANUSOL-HC) 2.5 % rectal cream Place 1 application rectally 2 (two) times daily. 30 g 0  . methocarbamol (ROBAXIN) 500 MG tablet Take 1 tablet (500 mg total) by mouth every 8 (eight) hours as needed for muscle spasms. 8 tablet 0  . Misc Natural Products (OSTEO BI-FLEX TRIPLE STRENGTH PO) Take 1 tablet by mouth daily. With Vit D    . omeprazole (PRILOSEC) 20 MG capsule Take 1 capsule by mouth daily 90 capsule 2  . promethazine-phenylephrine (PROMETHAZINE VC) 6.25-5 MG/5ML SYRP Take 5 mLs by mouth every 4 (four) hours as needed for congestion. 118 mL 0    . sertraline (ZOLOFT) 100 MG tablet Take 1 tablet by mouth daily 90 tablet 2  . spironolactone (ALDACTONE) 25 MG tablet Take 1 tablet (25 mg total) by mouth daily. 30 tablet 6  . TURMERIC PO Take 1 tablet by mouth daily.    Marland Kitchen VITAMIN E PO Take 1 tablet by mouth daily.    . VOLTAREN 1 % GEL APPLY ONE APPLICATION TOPICALLY 3 TIMES DAILY (Patient taking differently: APPLY ONE APPLICATION TOPICALLY daily as needed for shoulder pain) 100 g 3   No facility-administered medications prior to visit.     Per HPI unless specifically indicated in ROS section below Review of Systems Objective:  BP (!) 150/76 (BP Location: Left Arm, Patient Position: Sitting, Cuff Size: Normal)   Pulse 71   Temp 97.8 F (36.6 C) (Temporal)   Ht 5\' 1"  (1.549 m)   Wt 156 lb 6 oz (70.9 kg)   SpO2 97%   BMI 29.55 kg/m   Wt Readings from Last 3 Encounters:  10/30/19 156 lb 6 oz (70.9 kg)  09/10/19 161 lb (73 kg)  08/29/19 162 lb 4 oz (73.6 kg)      Physical Exam Constitutional:      General: She is not in acute distress.    Appearance: Normal appearance. She is well-developed. She is not ill-appearing.  HENT:     Head: Normocephalic and atraumatic.  Eyes:     General: No scleral icterus.    Extraocular Movements: Extraocular movements intact.     Conjunctiva/sclera: Conjunctivae normal.     Pupils: Pupils are equal, round, and reactive to light.  Cardiovascular:     Rate and Rhythm: Normal rate. Rhythm irregular.     Pulses: Normal pulses.     Heart sounds: Murmur (4/6 systolic best at USB) heard.      Comments: Few skipped beats Pulmonary:     Effort: Pulmonary effort is normal. No respiratory distress.     Breath sounds: Normal breath sounds. No wheezing, rhonchi or rales.  Musculoskeletal:     Cervical back: Normal range of motion and neck supple.     Right lower leg: No edema.     Left lower leg: No edema.  Neurological:     Mental Status: She is alert.  Psychiatric:        Mood and Affect:  Mood normal.        Behavior: Behavior normal.       Results for orders placed or performed in visit on 66/29/47  Basic metabolic panel  Result Value Ref Range   Sodium 136 135 - 145 mEq/L   Potassium 3.9 3.5 - 5.1 mEq/L   Chloride 102 96 - 112 mEq/L   CO2 28 19 - 32 mEq/L   Glucose, Bld 120 (H) 70 - 99 mg/dL   BUN 21 6 -  23 mg/dL   Creatinine, Ser 0.95 0.40 - 1.20 mg/dL   GFR 55.66 (L) >60.00 mL/min   Calcium 8.9 8.4 - 10.5 mg/dL   EKG - sinus bradycardia rate high 50s, normal axis, intervals, no acute ST/T changes.  Assessment & Plan:  This visit occurred during the SARS-CoV-2 public health emergency.  Safety protocols were in place, including screening questions prior to the visit, additional usage of staff PPE, and extensive cleaning of exam room while observing appropriate contact time as indicated for disinfecting solutions.   Problem List Items Addressed This Visit    Systolic murmur    Has become more harsh sounding.  Previously presumed aortic sclerosis based on prior echocardiogram 2013.  Will update echo      Rectal prolapse    Discussing surgical repair through [facility].       Hypertension, essential - Primary    Recently difficult to control blood pressure despite current regimen with multiple drug intolerances (hydralazine, clonidine, metoprolol, lisinopril, losartan and atenolol). They agree to retrial losartan 25mg  daily - will return in 7 days to recheck Cr. Continue spironolactone and lasix along with amlodipine daily. They agree with plan. Reviewed red flags of hypertensive emergency to seek ER care.      Relevant Medications   losartan (COZAAR) 25 MG tablet   Other Relevant Orders   EKG 12-Lead (Completed)   CKD (chronic kidney disease) stage 3, GFR 30-59 ml/min   Relevant Orders   Renal function panel   Chest discomfort    Recent episode was associated with back pain that improved after bowel movement suggesting GI etiology. Check EGK today. If recurrent  or abnormal Echo, consider cardiology eval.           Meds ordered this encounter  Medications  . losartan (COZAAR) 25 MG tablet    Sig: Take 1 tablet (25 mg total) by mouth daily.    Dispense:  30 tablet    Refill:  3   Orders Placed This Encounter  Procedures  . Renal function panel    Standing Status:   Future    Standing Expiration Date:   10/29/2020  . EKG 12-Lead    Patient Instructions  EKG today  Start losartan 25mg  daily  Continue spironolactone 25mg  and lasix 20mg  in the morning as well as amlodipine 10mg  at night time.  Return in 1 week for lab visit only to check potassium and kidney function.  See our referral coordinator to get heart ultrasound scheduled today.    Follow up plan: Return in about 4 weeks (around 11/27/2019), or if symptoms worsen or fail to improve, for follow up visit.  [name], MD

## 2019-10-30 NOTE — Patient Instructions (Addendum)
EKG today  Start losartan 25mg  daily  Continue spironolactone 25mg  and lasix 20mg  in the morning as well as amlodipine 10mg  at night time.  Return in 1 week for lab visit only to check potassium and kidney function.  See our referral coordinator to get heart ultrasound scheduled today.

## 2019-10-30 NOTE — Assessment & Plan Note (Signed)
Discussing surgical repair through [facility].

## 2019-10-30 NOTE — Assessment & Plan Note (Signed)
Recently difficult to control blood pressure despite current regimen with multiple drug intolerances (hydralazine, clonidine, metoprolol, lisinopril, losartan and atenolol). They agree to retrial losartan 25mg  daily - will return in 7 days to recheck Cr. Continue spironolactone and lasix along with amlodipine daily. They agree with plan. Reviewed red flags of hypertensive emergency to seek ER care.

## 2019-11-05 ENCOUNTER — Other Ambulatory Visit: Payer: Self-pay

## 2019-11-06 ENCOUNTER — Other Ambulatory Visit (INDEPENDENT_AMBULATORY_CARE_PROVIDER_SITE_OTHER): Payer: PPO

## 2019-11-06 DIAGNOSIS — N1831 Chronic kidney disease, stage 3a: Secondary | ICD-10-CM | POA: Diagnosis not present

## 2019-11-06 LAB — RENAL FUNCTION PANEL
Albumin: 4.1 g/dL (ref 3.5–5.2)
BUN: 21 mg/dL (ref 6–23)
CO2: 31 mEq/L (ref 19–32)
Calcium: 9.5 mg/dL (ref 8.4–10.5)
Chloride: 98 mEq/L (ref 96–112)
Creatinine, Ser: 0.93 mg/dL (ref 0.40–1.20)
GFR: 57.03 mL/min — ABNORMAL LOW (ref >60.00)
Glucose, Bld: 75 mg/dL (ref 70–99)
Phosphorus: 3.1 mg/dL (ref 2.3–4.6)
Potassium: 4.3 mEq/L (ref 3.5–5.1)
Sodium: 136 mEq/L (ref 135–145)

## 2019-11-06 NOTE — Telephone Encounter (Signed)
Pt does not qualify for assistance and is willing to pay out of pocket. Pt is in today for labs and wants to know if she can have inj. Need to get results back first.   Pt scheduled for inj on 7/15

## 2019-11-07 NOTE — Telephone Encounter (Signed)
Ca is normal and CrCl is 48.60mL/min. Ok to receive Prolia inj.

## 2019-11-08 ENCOUNTER — Ambulatory Visit (INDEPENDENT_AMBULATORY_CARE_PROVIDER_SITE_OTHER): Payer: PPO

## 2019-11-08 ENCOUNTER — Other Ambulatory Visit: Payer: Self-pay

## 2019-11-08 ENCOUNTER — Encounter: Payer: Self-pay | Admitting: Family Medicine

## 2019-11-08 DIAGNOSIS — M810 Age-related osteoporosis without current pathological fracture: Secondary | ICD-10-CM | POA: Diagnosis not present

## 2019-11-08 MED ORDER — DENOSUMAB 60 MG/ML ~~LOC~~ SOSY
60.0000 mg | PREFILLED_SYRINGE | Freq: Once | SUBCUTANEOUS | Status: AC
  Administered 2019-11-08: 60 mg via SUBCUTANEOUS

## 2019-11-08 NOTE — Progress Notes (Signed)
Per Dr Gutierrez's order, pt received Prolia injection in right arm. Tolerated well.

## 2019-11-09 NOTE — Telephone Encounter (Signed)
Printed log and placed in Dr. Synthia Innocent box.

## 2019-11-12 NOTE — Telephone Encounter (Addendum)
Log reviewed - 110-168/68-91, HR 50-70s.  Current antihypertensive regimen is: Amlodipine 10mg  nightly, lasix 20mg  daily, spironolactone 25mg  daily, losartan 25mg  daily.  Will continue current regimen at this time.

## 2019-11-24 ENCOUNTER — Other Ambulatory Visit: Payer: Self-pay | Admitting: Family Medicine

## 2019-11-26 ENCOUNTER — Ambulatory Visit (INDEPENDENT_AMBULATORY_CARE_PROVIDER_SITE_OTHER): Payer: PPO

## 2019-11-26 ENCOUNTER — Other Ambulatory Visit: Payer: Self-pay

## 2019-11-26 DIAGNOSIS — R011 Cardiac murmur, unspecified: Secondary | ICD-10-CM | POA: Diagnosis not present

## 2019-11-26 LAB — ECHOCARDIOGRAM COMPLETE
AR max vel: 1.29 cm2
AV Area VTI: 1.69 cm2
AV Area mean vel: 1.75 cm2
AV Mean grad: 8 mmHg
AV Peak grad: 20.8 mmHg
Ao pk vel: 2.28 m/s
Area-P 1/2: 3.48 cm2
Calc EF: 76.9 %
MV M vel: 3.97 m/s
MV Peak grad: 63 mmHg
S' Lateral: 2.7 cm
Single Plane A2C EF: 82.3 %
Single Plane A4C EF: 69.3 %

## 2019-11-26 NOTE — Telephone Encounter (Signed)
Electronic refill request. Clonazepam Last office visit:   10/30/2019 Last Filled:    30 tablet 0 11/22/2019  Please advise.

## 2019-11-26 NOTE — Telephone Encounter (Signed)
ERx 

## 2019-12-01 ENCOUNTER — Encounter: Payer: Self-pay | Admitting: Family Medicine

## 2019-12-03 NOTE — Telephone Encounter (Signed)
Printed BP readings.  Placed in Dr. Synthia Innocent box.

## 2019-12-04 ENCOUNTER — Other Ambulatory Visit: Payer: Self-pay | Admitting: Family Medicine

## 2019-12-04 ENCOUNTER — Ambulatory Visit: Payer: PPO | Admitting: Family Medicine

## 2019-12-07 ENCOUNTER — Ambulatory Visit: Payer: PPO | Admitting: Family Medicine

## 2019-12-08 ENCOUNTER — Encounter: Payer: Self-pay | Admitting: Family Medicine

## 2019-12-21 ENCOUNTER — Ambulatory Visit (INDEPENDENT_AMBULATORY_CARE_PROVIDER_SITE_OTHER): Payer: PPO | Admitting: Family Medicine

## 2019-12-21 ENCOUNTER — Encounter: Payer: Self-pay | Admitting: Family Medicine

## 2019-12-21 ENCOUNTER — Other Ambulatory Visit: Payer: Self-pay

## 2019-12-21 VITALS — BP 120/70 | HR 65 | Temp 97.9°F | Ht 61.0 in | Wt 157.4 lb

## 2019-12-21 DIAGNOSIS — K623 Rectal prolapse: Secondary | ICD-10-CM | POA: Diagnosis not present

## 2019-12-21 DIAGNOSIS — R0781 Pleurodynia: Secondary | ICD-10-CM | POA: Diagnosis not present

## 2019-12-21 DIAGNOSIS — K5909 Other constipation: Secondary | ICD-10-CM

## 2019-12-21 DIAGNOSIS — I10 Essential (primary) hypertension: Secondary | ICD-10-CM | POA: Diagnosis not present

## 2019-12-21 DIAGNOSIS — N1831 Chronic kidney disease, stage 3a: Secondary | ICD-10-CM | POA: Diagnosis not present

## 2019-12-21 DIAGNOSIS — I340 Nonrheumatic mitral (valve) insufficiency: Secondary | ICD-10-CM | POA: Diagnosis not present

## 2019-12-21 DIAGNOSIS — H3530 Unspecified macular degeneration: Secondary | ICD-10-CM

## 2019-12-21 MED ORDER — OMEPRAZOLE 20 MG PO CPDR: 20.0000 mg | DELAYED_RELEASE_CAPSULE | Freq: Every day | ORAL | 3 refills | Status: DC

## 2019-12-21 MED ORDER — CLONAZEPAM 0.5 MG PO TABS: 0.5000 mg | ORAL_TABLET | Freq: Every day | ORAL | 3 refills | Status: DC

## 2019-12-21 MED ORDER — FUROSEMIDE 20 MG PO TABS: 20.0000 mg | ORAL_TABLET | Freq: Every day | ORAL | 3 refills | Status: DC

## 2019-12-21 MED ORDER — SERTRALINE HCL 100 MG PO TABS: 100.0000 mg | ORAL_TABLET | Freq: Every day | ORAL | 3 refills | Status: DC

## 2019-12-21 NOTE — Telephone Encounter (Signed)
Printed and given to Dr. Darnell Level.  Pt here for OV.

## 2019-12-21 NOTE — Assessment & Plan Note (Addendum)
Reviewed recent echo results with pt and DIL.  rec yearly echo to monitor MR.

## 2019-12-21 NOTE — Assessment & Plan Note (Signed)
Rx for depends provided to see if it will be covered by insurance.

## 2019-12-21 NOTE — Patient Instructions (Addendum)
Orthostatic vital signs today.  Price out depens diapers with prescription.  No med changes today.  Return in 3 months for wellness visit/physical

## 2019-12-21 NOTE — Assessment & Plan Note (Addendum)
Current regimen: AM: Spironolactone 25mg , losartan 25mg  PM: amlodipine 10mg   PRN: lasix 20mg   ARB dose limited by renal insufficiency Chronic, overall adequate based on review of log she brings. Readings continue to fluctuate with morning readings largely well controlled, while evening readings tend to run high 119-417 systolic. Reviewed current regimen. She also endorses some low BP sand low readings with lightheadedness - discussed would want to avoid hypotension more than some high readings. No med changes today.  Orthostatic vital signs normal today.

## 2019-12-21 NOTE — Assessment & Plan Note (Signed)
Rx for depends provided to see if it will be covered by insurance.  Has decided to avoid surgery for now, feels symptoms are currently manageable without surgical intervention.

## 2019-12-21 NOTE — Progress Notes (Addendum)
This visit was conducted in person.  BP 120/70 (BP Location: Left Arm, Patient Position: Sitting, Cuff Size: Normal)   Pulse 65   Temp 97.9 F (36.6 C) (Temporal)   Ht 5\' 1"  (1.549 m)   Wt 157 lb 6 oz (71.4 kg)   SpO2 96%   BMI 29.74 kg/m   BP Readings from Last 3 Encounters:  12/21/19 120/70  10/30/19 (!) 150/76  09/18/19 (!) 146/73    No data found.  CC: HTN f/u visit  Subjective:    Patient ID: Brooke Beasley, female    DOB: July 10, 1930, 86 y.o.   MRN: [id number]  HPI: Brooke Beasley is a 86 y.o. female presenting on 12/21/2019 for Hypertension (Here for 1 mo f/u.  Pt accompanied by daughter, [name]- temp 97.9.  Wants to discuss Lutein eye drops. ) and Medication Refill (Needs refills for omeprazole and sertaline sent to [facility] order. )   Had a fall this week - thinks she cracked left rib due to increased pain with deep breath and sneezing. Walked in from outdoors, some lightheadedness prior to fall. Didn't check blood pressure.  H/o fluctuating blood pressures. HTN - Compliant with current antihypertensive regimen of amlodipine 10mg  daily, losartan 25mg  daily, spironolactone 25mg  daily, and lasix 20mg  daily PRN. ARB dose limited by prior h/o Cr bump when on higher dose. Does check blood pressures at home and brings log - 120-160/60-80, HR 60-70s. Taking spironolactone and losartan in am, amlodipine at night. No low blood pressure readings or symptoms of dizziness/syncope. Denies HA, vision changes, CP/tightness, leg swelling.   Daughter in law recently with COVID - has recovered well.   Recent echo 11/2019: EF 60-65%, normal LV fx, mild-mod MR, mild AS, rec yearly echocardiogram and if progressing refer to cards.   Asks about lutein eye drops recommended by ophtho for beginning stage macular degeneration. Told had freckle behind eye. No smoking history.   Asks about depends being covered by insurance. Uses 1-3/day.      Relevant past medical, surgical,  family and social history reviewed and updated as indicated. Interim medical history since our last visit reviewed. Allergies and medications reviewed and updated. Outpatient Medications Prior to Visit  Medication Sig Dispense Refill  . acetaminophen (TYLENOL) 500 MG tablet Take 500-1,000 mg by mouth at bedtime. Depends on pain    . albuterol (PROVENTIL HFA;VENTOLIN HFA) 108 (90 Base) MCG/ACT inhaler Inhale 2 puffs into the lungs every 6 (six) hours as needed for wheezing or shortness of breath. 1 Inhaler 0  . amLODipine (NORVASC) 10 MG tablet Take 1 tablet by mouth once daily 90 tablet 3  . azithromycin (ZITHROMAX) 250 MG tablet Take 1 tablet (250 mg total) by mouth daily. Take first 2 tablets together, then 1 every day until finished. 6 tablet 0  . bimatoprost (LUMIGAN) 0.01 % SOLN Place 1 drop into both eyes at bedtime.    Marland Kitchen Bioflavonoid Products (VITAMIN C) CHEW Chew 1 tablet by mouth daily.    . cholecalciferol (VITAMIN D) 1000 units tablet Take 1,000 Units by mouth daily.    . colchicine 0.6 MG tablet Take 1 tablet (0.6 mg total) by mouth daily as needed (gout flare). 90 tablet 0  . denosumab (PROLIA) 60 MG/ML SOSY injection Inject 60 mg into the skin every 6 (six) months. 1 mL 1  . EUTHYROX 100 MCG tablet Take 1 tablet by mouth once daily 90 tablet 1  . fish oil-omega-3 fatty acids 1000 MG capsule Take 1  g by mouth daily.     Marland Kitchen HYDROcodone-acetaminophen (NORCO/VICODIN) 5-325 MG tablet Take 1-2 tablets by mouth every 6 (six) hours as needed. 6 tablet 0  . hydrocortisone (ANUSOL-HC) 2.5 % rectal cream Place 1 application rectally 2 (two) times daily. 30 g 0  . losartan (COZAAR) 25 MG tablet Take 1 tablet (25 mg total) by mouth daily. 30 tablet 3  . methocarbamol (ROBAXIN) 500 MG tablet Take 1 tablet (500 mg total) by mouth every 8 (eight) hours as needed for muscle spasms. 8 tablet 0  . Misc Natural Products (OSTEO BI-FLEX TRIPLE STRENGTH PO) Take 1 tablet by mouth daily. With Vit D    .  promethazine-phenylephrine (PROMETHAZINE VC) 6.25-5 MG/5ML SYRP Take 5 mLs by mouth every 4 (four) hours as needed for congestion. 118 mL 0  . spironolactone (ALDACTONE) 25 MG tablet Take 1 tablet (25 mg total) by mouth daily. 30 tablet 6  . TURMERIC PO Take 1 tablet by mouth daily.    Marland Kitchen VITAMIN E PO Take 1 tablet by mouth daily.    . VOLTAREN 1 % GEL APPLY ONE APPLICATION TOPICALLY 3 TIMES DAILY (Patient taking differently: APPLY ONE APPLICATION TOPICALLY daily as needed for shoulder pain) 100 g 3  . clonazePAM (KLONOPIN) 0.5 MG tablet TAKE 1 TABLET BY MOUTH AT BEDTIME AS NEEDED FOR ANXIETY 30 tablet 0  . furosemide (LASIX) 20 MG tablet TAKE 1 TABLET BY MOUTH ONCE DAILY AS NEEDED FOR EDEMA 90 tablet 1  . omeprazole (PRILOSEC) 20 MG capsule Take 1 capsule by mouth daily 90 capsule 2  . sertraline (ZOLOFT) 100 MG tablet Take 1 tablet by mouth daily 90 tablet 2   No facility-administered medications prior to visit.     Per HPI unless specifically indicated in ROS section below Review of Systems Objective:  BP 120/70 (BP Location: Left Arm, Patient Position: Sitting, Cuff Size: Normal)   Pulse 65   Temp 97.9 F (36.6 C) (Temporal)   Ht 5\' 1"  (1.549 m)   Wt 157 lb 6 oz (71.4 kg)   SpO2 96%   BMI 29.74 kg/m   Wt Readings from Last 3 Encounters:  12/21/19 157 lb 6 oz (71.4 kg)  10/30/19 156 lb 6 oz (70.9 kg)  09/10/19 161 lb (73 kg)      Physical Exam Vitals and nursing note reviewed.  Constitutional:      Appearance: Normal appearance. She is not ill-appearing.  Cardiovascular:     Rate and Rhythm: Normal rate and regular rhythm.     Pulses: Normal pulses.     Heart sounds: Murmur (4/6 systolic USB) heard.   Pulmonary:     Effort: Pulmonary effort is normal. No respiratory distress.     Breath sounds: Normal breath sounds. No wheezing, rhonchi or rales.     Comments: Discomfort to left chest wall without point tenderness Chest:     Chest wall: Tenderness present.    Musculoskeletal:     Right lower leg: No edema.     Left lower leg: No edema.  Skin:    General: Skin is warm and dry.     Findings: No rash.  Neurological:     Mental Status: She is alert.  Psychiatric:        Mood and Affect: Mood normal.        Behavior: Behavior normal.       Lab Results  Component Value Date   CREATININE 0.93 11/06/2019   BUN 21 11/06/2019   NA  136 11/06/2019   K 4.3 11/06/2019   CL 98 11/06/2019   CO2 31 11/06/2019    Assessment & Plan:  This visit occurred during the SARS-CoV-2 public health emergency.  Safety protocols were in place, including screening questions prior to the visit, additional usage of staff PPE, and extensive cleaning of exam room while observing appropriate contact time as indicated for disinfecting solutions.   Problem List Items Addressed This Visit    Rib pain on left side    After fall - exam not consistent with rib fracture but rather likely ribcage contusion. Supportive care reviewed.       Rectal prolapse    Rx for depends provided to see if it will be covered by insurance.  Has decided to avoid surgery for now, feels symptoms are currently manageable without surgical intervention.       Moderate mitral regurgitation by prior echocardiogram    Reviewed recent echo results with pt and DIL.  rec yearly echo to monitor MR.       Relevant Medications   furosemide (LASIX) 20 MG tablet   Macular degeneration    Told had early macular degeneration - advise to start AREDS2 Preservision MVI with lutein. Non smoker - ok to start this.       Hypertension, essential - Primary    Current regimen: AM: Spironolactone 25mg , losartan 25mg  PM: amlodipine 10mg   PRN: lasix 20mg   ARB dose limited by renal insufficiency Chronic, overall adequate based on review of log she brings. Readings continue to fluctuate with morning readings largely well controlled, while evening readings tend to run high 627-035 systolic. Reviewed current  regimen. She also endorses some low BP sand low readings with lightheadedness - discussed would want to avoid hypotension more than some high readings. No med changes today.  Orthostatic vital signs normal today.       Relevant Medications   furosemide (LASIX) 20 MG tablet   CKD (chronic kidney disease) stage 3, GFR 30-59 ml/min    Continue low dose ARB      Chronic constipation with overflow incontinence    Rx for depends provided to see if it will be covered by insurance.           Meds ordered this encounter  Medications  . furosemide (LASIX) 20 MG tablet    Sig: Take 1 tablet (20 mg total) by mouth daily.    Dispense:  90 tablet    Refill:  3  . clonazePAM (KLONOPIN) 0.5 MG tablet    Sig: Take 1 tablet (0.5 mg total) by mouth at bedtime.    Dispense:  30 tablet    Refill:  3  . omeprazole (PRILOSEC) 20 MG capsule    Sig: Take 1 capsule (20 mg total) by mouth daily.    Dispense:  90 capsule    Refill:  3  . sertraline (ZOLOFT) 100 MG tablet    Sig: Take 1 tablet (100 mg total) by mouth daily.    Dispense:  90 tablet    Refill:  3  . Multiple Vitamins-Minerals (PRESERVISION AREDS 2+MULTI VIT) CAPS    Sig: Take 1 capsule by mouth daily.   No orders of the defined types were placed in this encounter.   Patient Instructions  Orthostatic vital signs today.  Price out depens diapers with prescription.  No med changes today.  Return in 3 months for wellness visit/physical   Follow up plan: No follow-ups on file.  [name], MD

## 2019-12-22 DIAGNOSIS — R0781 Pleurodynia: Secondary | ICD-10-CM | POA: Insufficient documentation

## 2019-12-22 DIAGNOSIS — H3530 Unspecified macular degeneration: Secondary | ICD-10-CM | POA: Insufficient documentation

## 2019-12-22 MED ORDER — PRESERVISION AREDS 2+MULTI VIT PO CAPS: 1.0000 | ORAL_CAPSULE | Freq: Every day | ORAL | Status: DC

## 2019-12-22 NOTE — Assessment & Plan Note (Signed)
Continue low dose ARB

## 2019-12-22 NOTE — Addendum Note (Signed)
Addended by: [name] on: 12/22/2019 12:06 PM   Modules accepted: Orders

## 2019-12-22 NOTE — Assessment & Plan Note (Signed)
Told had early macular degeneration - advise to start AREDS2 Preservision MVI with lutein. Non smoker - ok to start this.

## 2019-12-22 NOTE — Assessment & Plan Note (Signed)
After fall - exam not consistent with rib fracture but rather likely ribcage contusion. Supportive care reviewed.

## 2020-01-01 ENCOUNTER — Encounter: Payer: Self-pay | Admitting: Family Medicine

## 2020-01-01 DIAGNOSIS — I10 Essential (primary) hypertension: Secondary | ICD-10-CM

## 2020-01-14 ENCOUNTER — Telehealth: Payer: Self-pay

## 2020-01-14 DIAGNOSIS — R197 Diarrhea, unspecified: Secondary | ICD-10-CM | POA: Diagnosis not present

## 2020-01-14 NOTE — Telephone Encounter (Signed)
Please do  RV

## 2020-01-14 NOTE — Addendum Note (Signed)
Addended by: Ulyess Blossom L on: 01/14/2020 10:09 AM   Modules accepted: Orders

## 2020-01-14 NOTE — Telephone Encounter (Signed)
Patient daughter is calling because patient has been having diarrhea for 2 weeks. She said it is 6-7 times a day. She is having lower abdominal pain that is sharp that comes and goes. She is weak the daughter states. Daughter wants to know if we can order a cDIff test to see what going on.

## 2020-01-14 NOTE — Telephone Encounter (Signed)
Order the GI profile per Dr. Marius Ditch orders. Called daughter back and she states someone will pick up the lab test today and then return sample. Gave her our lab ours for our office but informed her she could go to any [facility] facility

## 2020-01-16 LAB — GI PROFILE, STOOL, PCR

## 2020-01-17 ENCOUNTER — Telehealth: Payer: Self-pay | Admitting: Family Medicine

## 2020-01-17 NOTE — Telephone Encounter (Signed)
Jermone @ [employer]  Calling with pt checking on rx for depends.  Pt has rx and she wanted to know if [employer] will pay for this   Pt wanted to know   Where can pt get purchase How much would this cost  I spoke with rena  Rena suggested pt call pharmacy or medical supply place

## 2020-01-18 NOTE — Telephone Encounter (Signed)
Spoke with pt suggesting she contact a pharmacy or medical supply store.  Pt verbalizes understanding.

## 2020-01-22 ENCOUNTER — Encounter: Payer: Self-pay | Admitting: Gastroenterology

## 2020-01-28 ENCOUNTER — Encounter: Payer: Self-pay | Admitting: Cardiology

## 2020-01-28 ENCOUNTER — Ambulatory Visit (INDEPENDENT_AMBULATORY_CARE_PROVIDER_SITE_OTHER): Payer: PPO | Admitting: Cardiology

## 2020-01-28 ENCOUNTER — Other Ambulatory Visit: Payer: Self-pay

## 2020-01-28 VITALS — BP 138/74 | HR 75 | Ht 61.0 in | Wt 159.0 lb

## 2020-01-28 DIAGNOSIS — I5189 Other ill-defined heart diseases: Secondary | ICD-10-CM | POA: Diagnosis not present

## 2020-01-28 DIAGNOSIS — I10 Essential (primary) hypertension: Secondary | ICD-10-CM | POA: Diagnosis not present

## 2020-01-28 DIAGNOSIS — I350 Nonrheumatic aortic (valve) stenosis: Secondary | ICD-10-CM

## 2020-01-28 MED ORDER — LOSARTAN POTASSIUM 25 MG PO TABS: 25.0000 mg | ORAL_TABLET | Freq: Two times a day (BID) | ORAL | 3 refills | Status: DC

## 2020-01-28 NOTE — Patient Instructions (Signed)
Medication Instructions:  Your physician has recommended you make the following change in your medication:   INCREASE your losartan (COZAAR) 25 MG tablet:  Take 1 tablet (25 mg total) by mouth 2 (two) times daily.  *If you need a refill on your cardiac medications before your next appointment, please call your pharmacy*   Lab Work:  Your physician recommends that you return for lab work in: 2 weeks.  If you have labs (blood work) drawn today and your tests are completely normal, you will receive your results only by: Marland Kitchen MyChart Message (if you have MyChart) OR . A paper copy in the mail If you have any lab test that is abnormal or we need to change your treatment, we will call you to review the results.   Testing/Procedures: None Ordered   Follow-Up: At [facility], you and your health needs are our priority.  As part of our continuing mission to provide you with exceptional heart care, we have created designated Provider Care Teams.  These Care Teams include your primary Cardiologist (physician) and Advanced Practice Providers (APPs -  Physician Assistants and Nurse Practitioners) who all work together to provide you with the care you need, when you need it.  We recommend signing up for the patient portal called "MyChart".  Sign up information is provided on this After Visit Summary.  MyChart is used to connect with patients for Virtual Visits (Telemedicine).  Patients are able to view lab/test results, encounter notes, upcoming appointments, etc.  Non-urgent messages can be sent to your provider as well.   To learn more about what you can do with MyChart, go to [email or web address].    Your next appointment:   1 month(s)  The format for your next appointment:   In Person  Provider:   Kate Sable, MD   Other Instructions

## 2020-01-28 NOTE — Progress Notes (Signed)
Cardiology Office Note:    Date:  01/28/2020   ID:  Brooke Beasley, DOB October 12, 1930, MRN [id number]  PCP:  [name], MD  [place] Cardiologist:  No primary care provider on file.  [facility] Electrophysiologist:  None   Referring MD: [name], MD   Chief Complaint  Patient presents with  . New Patient (Initial Visit)    Pt c/o SOB--when she lays down occasionally and CP--states she has woken up with chest pain. C/o anxiety; some weakness after using the bathroom   Brooke Beasley is a 86 y.o. female who is being seen today for the evaluation of uncontrolled hypertension at the request of [name], MD.   History of Present Illness:    Brooke Beasley is a 86 y.o. female with a hx of anxiety, hypertension, hyperlipidemia, who presents due to hypertension.  Patient states being diagnosed with hypertension for years now.  Blood pressure was previously controlled, has noticed elevated blood pressures over the past year typically in the evenings.  Was previously placed on lisinopril which helped with her blood pressure but this was stopped due to cough.  Denies chest pain, but states having shortness of breath sometimes at rest and other times with exertion.  Has trace lower extremity edema which improved with Lasix.  Checks her blood pressure frequently at home with systolic in the 275T to 700.  Echocardiogram on 11/26/2019 showed normal systolic function, EF 60 to 17%, grade 2 diastolic dysfunction.  Mild to moderate MR, mild MS.  Past Medical History:  Diagnosis Date  . Anxiety   . CAP (community acquired pneumonia) 12/22/2017  . Carotid stenosis 10/18/2014   R 1-39%, L 40-59%, rpt 1 yr (09/2014)   . CKD (chronic kidney disease) stage 3, GFR 30-59 ml/min (HCC) 08/02/2014  . Clostridioides difficile infection    hx 2020  . Degenerative disc disease    LS  . Depression    nervous breakdonw in 1964-out of work for a year  .  Diverticulosis    severe by colonoscopy  . Family history of adverse reaction to anesthesia    n/v  . GERD (gastroesophageal reflux disease)   . Glaucoma   . Heart murmur 5/13   mitral regurge - on echo   . History of hiatal hernia   . History of shingles   . HTN (hypertension)   . Hyperlipidemia   . Hypertension   . Hypothyroidism   . IBS 11/02/2006  . Intestinal bacterial overgrowth    In small colon  . Maxillary fracture ([place]) 04/08/2012  . Osteoarthritis 2016   Jefm Bryant)  . Osteoporosis    dexa 2011  . Pseudogout 2016   shoulders ([place])  . Pseudogout     Past Surgical History:  Procedure Laterality Date  . barium enema  2012   severe diverticulosis, redundant colon  . Bowel obstruction  1999   no surgery in hosp x 3 days  . COLONOSCOPY  1. 1999  2. 11/04   1. Not finished  2. Slight hemorrhage rectosigmoid area, severe sig diverticulosis  . COLONOSCOPY WITH PROPOFOL N/A 09/10/2019   SSP with dysplasia, TA (Vanga, Tally Due, MD)  . Dexa  1. 4944-9675   2. 9/04  3. 3/08    1. OP  2. OP, borderline, spine -2.44T  3. decreased BMD-OP  . DEXA  2011   OP in spine  . DG KNEE 1-2 VIEWS BILAT     LS x-ray with degenerative disc and  facet change  . ESOPHAGOGASTRODUODENOSCOPY     Negative  . Hemorrhoid procedure  4/08  . JOINT REPLACEMENT    . TONSILLECTOMY    . TOTAL SHOULDER ARTHROPLASTY Left 11/27/2015   Corky Mull, MD  . TOTAL SHOULDER REVISION Left 03/16/2016   Procedure: TOTAL SHOULDER REVISION;  Surgeon: Corky Mull, MD;  Location: [facility] ORS;  Service: Orthopedics;  Laterality: Left;  . US ECHOCARDIOGRAPHY  11/6576   Normal systolic fxn with EF 46-96%.  Focal basal septal hypertrophy.  Mild diastolic dysfunction.  Mild MR.    Current Medications: Current Meds  Medication Sig  . acetaminophen (TYLENOL) 500 MG tablet Take 500-1,000 mg by mouth at bedtime. Depends on pain  . albuterol (PROVENTIL HFA;VENTOLIN HFA) 108 (90 Base) MCG/ACT inhaler Inhale 2 puffs  into the lungs every 6 (six) hours as needed for wheezing or shortness of breath.  Marland Kitchen amLODipine (NORVASC) 10 MG tablet Take 10 mg by mouth daily.  . bimatoprost (LUMIGAN) 0.01 % SOLN Place 1 drop into both eyes at bedtime.  Marland Kitchen Bioflavonoid Products (VITAMIN C) CHEW Chew 1 tablet by mouth daily.  . cholecalciferol (VITAMIN D) 1000 units tablet Take 1,000 Units by mouth daily.  . clonazePAM (KLONOPIN) 0.5 MG tablet Take 1 tablet (0.5 mg total) by mouth at bedtime.  . colchicine 0.6 MG tablet Take 1 tablet (0.6 mg total) by mouth daily as needed (gout flare).  Marland Kitchen denosumab (PROLIA) 60 MG/ML SOSY injection Inject 60 mg into the skin every 6 (six) months.  . EUTHYROX 100 MCG tablet Take 1 tablet by mouth once daily  . furosemide (LASIX) 20 MG tablet Take 1 tablet (20 mg total) by mouth daily.  Marland Kitchen HYDROcodone-acetaminophen (NORCO/VICODIN) 5-325 MG tablet Take 1-2 tablets by mouth every 6 (six) hours as needed.  Marland Kitchen losartan (COZAAR) 25 MG tablet Take 1 tablet (25 mg total) by mouth 2 (two) times daily.  . Multiple Vitamins-Minerals (PRESERVISION AREDS 2+MULTI VIT) CAPS Take 1 capsule by mouth daily.  Marland Kitchen omeprazole (PRILOSEC) 20 MG capsule Take 1 capsule (20 mg total) by mouth daily.  . sertraline (ZOLOFT) 100 MG tablet Take 1 tablet (100 mg total) by mouth daily.  Marland Kitchen spironolactone (ALDACTONE) 25 MG tablet Take 1 tablet (25 mg total) by mouth daily.  . TURMERIC PO Take 1 tablet by mouth daily.  Marland Kitchen VITAMIN E PO Take 1 tablet by mouth daily.  . [DISCONTINUED] losartan (COZAAR) 25 MG tablet Take 1 tablet (25 mg total) by mouth daily.     Allergies:   Prednisone, Ace inhibitors, Alendronate sodium, Losartan, Other, Paroxetine hcl, Risedronate sodium, Silenor [doxepin hcl], Simvastatin, Sulfonamide derivatives, Toprol xl [metoprolol], Tramadol, and Influenza vaccines   Social History   Socioeconomic History  . Marital status: Married    Spouse name: Not on file  . Number of children: 2  . Years of  education: Not on file  . Highest education level: Not on file  Occupational History  . Occupation: Retired    Fish farm manager: RETIRED  Tobacco Use  . Smoking status: Never Smoker  . Smokeless tobacco: Never Used  Vaping Use  . Vaping Use: Never used  Substance and Sexual Activity  . Alcohol use: No  . Drug use: No  . Sexual activity: Not Currently  Other Topics Concern  . Not on file  Social History Narrative   Left handed   Widow. Husband ([name]) deceased 04/04/16 from dementia. She was caregiver.    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 Difficulty of Paying Living Expenses: Not on file  Food Insecurity:   . Worried About Charity fundraiser in the Last Year: Not on file  . Ran Out of Food in the Last Year: Not on file  Transportation Needs:   . Lack of Transportation (Medical): Not on file  . Lack of Transportation (Non-Medical): Not on file  Physical Activity:   . Days of Exercise per Week: Not on file  . Minutes of Exercise per Session: Not on file  Stress:   . Feeling of Stress : Not on file  Social Connections:   . Frequency of Communication with Friends and Family: Not on file  . Frequency of Social Gatherings with Friends and Family: Not on file  . Attends Religious Services: Not on file  . Active Member of Clubs or Organizations: Not on file  . Attends Archivist Meetings: Not on file  . Marital Status: Not on file     Family History: The patient's family history includes Breast cancer in her cousin; Coronary artery disease in her brother; Diabetes in her brother and paternal grandfather.  ROS:   Please see the history of present illness.     All other systems reviewed and are negative.  EKGs/Labs/Other Studies Reviewed:    The following  studies were reviewed today:   EKG:  EKG is  ordered today.  The ekg ordered today demonstrates sinus rhythm, PACs.  Recent Labs: 03/28/2019: Magnesium 2.1 07/09/2019: ALT 11; Hemoglobin 13.8; Platelets 228.0; TSH 0.84 11/06/2019: BUN 21; Creatinine, Ser 0.93; Potassium 4.3; Sodium 136  Recent Lipid Panel    Component Value Date/Time   CHOL 148 10/16/2018 0810   TRIG 147.0 10/16/2018 0810   HDL 45.10 10/16/2018 0810   CHOLHDL 3 10/16/2018 0810   VLDL 29.4 10/16/2018 0810   LDLCALC 73 10/16/2018 0810   LDLDIRECT 86.0 09/19/2014 1049    Physical Exam:    VS:  BP 138/74   Pulse 75   Ht 5\' 1"  (1.549 m)   Wt 159 lb (72.1 kg)   BMI 30.04 kg/m     Wt Readings from Last 3 Encounters:  01/28/20 159 lb (72.1 kg)  12/21/19 157 lb 6 oz (71.4 kg)  10/30/19 156 lb 6 oz (70.9 kg)     GEN:  Well nourished, well developed in no acute distress HEENT: Normal NECK: No JVD; No carotid bruits LYMPHATICS: No lymphadenopathy CARDIAC: RRR, 3/6 systolic murmur, rubs, gallops RESPIRATORY:  Clear to auscultation without rales, wheezing or rhonchi  ABDOMEN: Soft, non-tender, non-distended MUSCULOSKELETAL: Trace edema; No deformity  SKIN: Warm and dry NEUROLOGIC:  Alert and oriented x 3 PSYCHIATRIC:  Normal affect   ASSESSMENT:    1. Primary hypertension   2. Diastolic dysfunction   3. Aortic valve stenosis, etiology of cardiac valve disease unspecified    PLAN:    In order of problems listed above:  1. Patient with uncontrolled blood pressure.  Increase losartan to 25 mg twice daily.  If blood pressure is controlled, plan to consolidate losartan.  Continue amlodipine, Aldactone and Lasix as prescribed.  Check BMP in 2 weeks. 2. Recent echocardiogram with grade 2 diastolic dysfunction.  Long history of hypertension likely etiology.  Lasix for edema, continue Aldactone and management for  hypertension as above. 3. Systolic murmur noted on exam, mild aortic stenosis.  Serial echocardiograms  for monitoring.  Follow-up in 1 month.   Medication Adjustments/Labs and Tests Ordered: Current medicines are reviewed at length with the patient today.  Concerns regarding medicines are outlined above.  Orders Placed This Encounter  Procedures  . Basic metabolic panel  . EKG 12-Lead   Meds ordered this encounter  Medications  . losartan (COZAAR) 25 MG tablet    Sig: Take 1 tablet (25 mg total) by mouth 2 (two) times daily.    Dispense:  60 tablet    Refill:  3    Patient Instructions  Medication Instructions:  Your physician has recommended you make the following change in your medication:   INCREASE your losartan (COZAAR) 25 MG tablet:  Take 1 tablet (25 mg total) by mouth 2 (two) times daily.  *If you need a refill on your cardiac medications before your next appointment, please call your pharmacy*   Lab Work:  Your physician recommends that you return for lab work in: 2 weeks.  If you have labs (blood work) drawn today and your tests are completely normal, you will receive your results only by: Marland Kitchen MyChart Message (if you have MyChart) OR . A paper copy in the mail If you have any lab test that is abnormal or we need to change your treatment, we will call you to review the results.   Testing/Procedures: None Ordered   Follow-Up: At [facility], you and your health needs are our priority.  As part of our continuing mission to provide you with exceptional heart care, we have created designated Provider Care Teams.  These Care Teams include your primary Cardiologist (physician) and Advanced Practice Providers (APPs -  Physician Assistants and Nurse Practitioners) who all work together to provide you with the care you need, when you need it.  We recommend signing up for the patient portal called "MyChart".  Sign up information is provided on this After Visit Summary.  MyChart is used to connect with patients for Virtual Visits (Telemedicine).  Patients are able to view  lab/test results, encounter notes, upcoming appointments, etc.  Non-urgent messages can be sent to your provider as well.   To learn more about what you can do with MyChart, go to [email or web address].    Your next appointment:   1 month(s)  The format for your next appointment:   In Person  Provider:   Kate Sable, MD   Other Instructions      Signed, Kate Sable, MD  01/28/2020 5:27 PM    [place]

## 2020-02-01 ENCOUNTER — Other Ambulatory Visit: Payer: Self-pay | Admitting: Family Medicine

## 2020-02-07 ENCOUNTER — Other Ambulatory Visit: Payer: Self-pay | Admitting: *Deleted

## 2020-02-07 MED ORDER — LOSARTAN POTASSIUM 25 MG PO TABS
25.0000 mg | ORAL_TABLET | Freq: Two times a day (BID) | ORAL | 3 refills | Status: DC
Start: 2020-02-07 — End: 2020-02-12

## 2020-02-12 ENCOUNTER — Other Ambulatory Visit: Payer: Self-pay | Admitting: *Deleted

## 2020-02-12 ENCOUNTER — Other Ambulatory Visit
Admission: RE | Admit: 2020-02-12 | Discharge: 2020-02-12 | Disposition: A | Discharge status: Home / Self Care | Payer: PPO | Source: Ambulatory Visit | Attending: Cardiology | Admitting: Cardiology

## 2020-02-12 DIAGNOSIS — I10 Essential (primary) hypertension: Secondary | ICD-10-CM | POA: Diagnosis not present

## 2020-02-12 LAB — BASIC METABOLIC PANEL
Anion gap: 9 (ref 5–15)
BUN: 18 mg/dL (ref 8–23)
CO2: 29 mmol/L (ref 22–32)
Calcium: 9.6 mg/dL (ref 8.9–10.3)
Chloride: 99 mmol/L (ref 98–111)
Creatinine, Ser: 1.04 mg/dL — ABNORMAL HIGH (ref 0.44–1.00)
GFR, Estimated: 48 mL/min — ABNORMAL LOW (ref >60)
Glucose, Bld: 94 mg/dL (ref 70–99)
Potassium: 4.2 mmol/L (ref 3.5–5.1)
Sodium: 137 mmol/L (ref 135–145)

## 2020-02-12 MED ORDER — LOSARTAN POTASSIUM 50 MG PO TABS: 50.0000 mg | ORAL_TABLET | Freq: Two times a day (BID) | ORAL | 6 refills | Status: DC

## 2020-03-03 ENCOUNTER — Other Ambulatory Visit: Payer: Self-pay

## 2020-03-03 ENCOUNTER — Encounter: Payer: Self-pay | Admitting: Cardiology

## 2020-03-03 ENCOUNTER — Ambulatory Visit: Payer: PPO | Admitting: Cardiology

## 2020-03-03 VITALS — BP 160/90 | HR 72 | Ht 61.0 in | Wt 161.5 lb

## 2020-03-03 DIAGNOSIS — I5189 Other ill-defined heart diseases: Secondary | ICD-10-CM | POA: Diagnosis not present

## 2020-03-03 DIAGNOSIS — I10 Essential (primary) hypertension: Secondary | ICD-10-CM | POA: Diagnosis not present

## 2020-03-03 DIAGNOSIS — I350 Nonrheumatic aortic (valve) stenosis: Secondary | ICD-10-CM

## 2020-03-03 MED ORDER — LOSARTAN POTASSIUM 100 MG PO TABS: 100.0000 mg | ORAL_TABLET | Freq: Every day | ORAL | 5 refills | Status: DC

## 2020-03-03 MED ORDER — FUROSEMIDE 20 MG PO TABS
20.0000 mg | ORAL_TABLET | ORAL | 5 refills | Status: DC | PRN
Start: 2020-03-03 — End: 2021-01-16

## 2020-03-03 MED ORDER — LOSARTAN POTASSIUM 100 MG PO TABS: 50.0000 mg | ORAL_TABLET | Freq: Every day | ORAL | 5 refills | Status: DC

## 2020-03-03 MED ORDER — CARVEDILOL 6.25 MG PO TABS: 6.2500 mg | ORAL_TABLET | Freq: Two times a day (BID) | ORAL | 5 refills | Status: DC

## 2020-03-03 NOTE — Patient Instructions (Signed)
Medication Instructions:  Your physician has recommended you make the following change in your medication:   1.  CHANGE your losartan (COZAAR) to a 100 MG tablet: Take 1 tablet (100 mg total) by mouth daily. 2.  START taking carvedilol (COREG) 6.25 MG tablet: Take 1 tablet (6.25 mg total) by mouth 2 (two) times daily. 3.  CHANGE your furosemide (LASIX) 20 MG tablet: Take 1 tablet (20 mg total) by mouth ONLY AS NEEDED.  *If you need a refill on your cardiac medications before your next appointment, please call your pharmacy*   Lab Work:  Your physician recommends that you return for lab work in: 2 weeks.  - Please go to the [facility]. You will check in at the front desk to the right as you walk into the atrium. Valet Parking is offered if needed. - No appointment needed. You may go any day between 7 am and 6 pm.   Testing/Procedures: None Ordered   Follow-Up: At [facility], you and your health needs are our priority.  As part of our continuing mission to provide you with exceptional heart care, we have created designated Provider Care Teams.  These Care Teams include your primary Cardiologist (physician) and Advanced Practice Providers (APPs -  Physician Assistants and Nurse Practitioners) who all work together to provide you with the care you need, when you need it.  We recommend signing up for the patient portal called "MyChart".  Sign up information is provided on this After Visit Summary.  MyChart is used to connect with patients for Virtual Visits (Telemedicine).  Patients are able to view lab/test results, encounter notes, upcoming appointments, etc.  Non-urgent messages can be sent to your provider as well.   To learn more about what you can do with MyChart, go to [email or web address].    Your next appointment:   4 week(s)  The format for your next appointment:   In Person  Provider:   Kate Sable, MD   Other Instructions

## 2020-03-03 NOTE — Progress Notes (Signed)
Cardiology Office Note:    Date:  03/03/2020   ID:  Brooke Beasley, DOB [birth year], MRN [id number]  PCP:  [name], MD  [place] Cardiologist:  No primary care provider on file.  [facility] Electrophysiologist:  None   Referring MD: [name], MD   Chief Complaint  Patient presents with  . OTHER    1 month f/u c/o elevated BP, weakness and fatigue. Meds reviewed verbally with pt.    History of Present Illness:    Brooke Beasley is a 86 y.o. female with a hx of anxiety, hypertension, hyperlipidemia, who presents for follow-up.  Patient last seen due to elevated blood pressures.  Losartan was increased to 25 mg twice daily.  Had some edema, echo showing grade 2 diastolic dysfunction, edema improved with Lasix.  Amlodipine was also stopped due to edema.  Her blood pressures have been elevated of late.  616W to 737T systolic.  Has no concerns at this time except for elevated BP.  Prior notes Echocardiogram on 11/26/2019 showed normal systolic function, EF 60 to 06%, grade 2 diastolic dysfunction.  Mild to moderate MR, mild MS.  Patient previously took lisinopril, but stopped due to side effects/cough.  Past Medical History:  Diagnosis Date  . Anxiety   . CAP (community acquired pneumonia) 12/22/2017  . Carotid stenosis 10/18/2014   R 1-39%, L 40-59%, rpt 1 yr (09/2014)   . CKD (chronic kidney disease) stage 3, GFR 30-59 ml/min (HCC) 08/02/2014  . Clostridioides difficile infection    hx 2020  . Degenerative disc disease    LS  . Depression    nervous breakdonw in 1964-out of work for a year  . Diverticulosis    severe by colonoscopy  . Family history of adverse reaction to anesthesia    n/v  . GERD (gastroesophageal reflux disease)   . Glaucoma   . Heart murmur 5/13   mitral regurge - on echo   . History of hiatal hernia   . History of shingles   . HTN (hypertension)   . Hyperlipidemia   . Hypertension   . Hypothyroidism   . IBS  11/02/2006  . Intestinal bacterial overgrowth    In small colon  . Maxillary fracture ([place]) 04/08/2012  . Osteoarthritis 2016   Jefm Bryant)  . Osteoporosis    dexa 2011  . Pseudogout 2016   shoulders ([place])  . Pseudogout     Past Surgical History:  Procedure Laterality Date  . barium enema  2012   severe diverticulosis, redundant colon  . Bowel obstruction  1999   no surgery in hosp x 3 days  . COLONOSCOPY  1. 1999  2. 11/04   1. Not finished  2. Slight hemorrhage rectosigmoid area, severe sig diverticulosis  . COLONOSCOPY WITH PROPOFOL N/A 09/10/2019   SSP with dysplasia, TA (Vanga, Tally Due, MD)  . Dexa  1. 2694-8546   2. 9/04  3. 3/08    1. OP  2. OP, borderline, spine -2.44T  3. decreased BMD-OP  . DEXA  2011   OP in spine  . DG KNEE 1-2 VIEWS BILAT     LS x-ray with degenerative disc and facet change  . ESOPHAGOGASTRODUODENOSCOPY     Negative  . Hemorrhoid procedure  4/08  . JOINT REPLACEMENT    . TONSILLECTOMY    . TOTAL SHOULDER ARTHROPLASTY Left 11/27/2015   Corky Mull, MD  . TOTAL SHOULDER REVISION Left 03/16/2016   Procedure: TOTAL SHOULDER REVISION;  Surgeon: Corky Mull, MD;  Location: [facility] ORS;  Service: Orthopedics;  Laterality: Left;  . US ECHOCARDIOGRAPHY  05/9560   Normal systolic fxn with EF 13-08%.  Focal basal septal hypertrophy.  Mild diastolic dysfunction.  Mild MR.    Current Medications: Current Meds  Medication Sig  . acetaminophen (TYLENOL) 500 MG tablet Take 500-1,000 mg by mouth at bedtime. Depends on pain  . albuterol (PROVENTIL HFA;VENTOLIN HFA) 108 (90 Base) MCG/ACT inhaler Inhale 2 puffs into the lungs every 6 (six) hours as needed for wheezing or shortness of breath.  Marland Kitchen amLODipine (NORVASC) 10 MG tablet Take 10 mg by mouth daily.  . bimatoprost (LUMIGAN) 0.01 % SOLN Place 1 drop into both eyes at bedtime.  Marland Kitchen Bioflavonoid Products (VITAMIN C) CHEW Chew 1 tablet by mouth daily.  . cholecalciferol (VITAMIN D) 1000 units tablet Take  1,000 Units by mouth daily.  . clonazePAM (KLONOPIN) 0.5 MG tablet Take 1 tablet (0.5 mg total) by mouth at bedtime.  . colchicine 0.6 MG tablet Take 1 tablet (0.6 mg total) by mouth daily as needed (gout flare).  Marland Kitchen denosumab (PROLIA) 60 MG/ML SOSY injection Inject 60 mg into the skin every 6 (six) months.  . EUTHYROX 100 MCG tablet Take 1 tablet by mouth once daily  . fish oil-omega-3 fatty acids 1000 MG capsule Take 1 g by mouth daily.   . furosemide (LASIX) 20 MG tablet Take 1 tablet (20 mg total) by mouth as needed.  Marland Kitchen HYDROcodone-acetaminophen (NORCO/VICODIN) 5-325 MG tablet Take 1-2 tablets by mouth every 6 (six) hours as needed.  . hydrocortisone (ANUSOL-HC) 2.5 % rectal cream Place 1 application rectally 2 (two) times daily.  Marland Kitchen losartan (COZAAR) 100 MG tablet Take 1 tablet (100 mg total) by mouth daily.  . methocarbamol (ROBAXIN) 500 MG tablet Take 1 tablet (500 mg total) by mouth every 8 (eight) hours as needed for muscle spasms.  . Misc Natural Products (OSTEO BI-FLEX TRIPLE STRENGTH PO) Take 1 tablet by mouth daily. With Vit D  . Multiple Vitamins-Minerals (PRESERVISION AREDS 2+MULTI VIT) CAPS Take 1 capsule by mouth daily.  Marland Kitchen omeprazole (PRILOSEC) 20 MG capsule Take 1 capsule (20 mg total) by mouth daily.  . promethazine-phenylephrine (PROMETHAZINE VC) 6.25-5 MG/5ML SYRP Take 5 mLs by mouth every 4 (four) hours as needed for congestion.  . sertraline (ZOLOFT) 100 MG tablet Take 1 tablet (100 mg total) by mouth daily.  Marland Kitchen spironolactone (ALDACTONE) 25 MG tablet Take 1 tablet (25 mg total) by mouth daily.  . TURMERIC PO Take 1 tablet by mouth daily.  Marland Kitchen VITAMIN E PO Take 1 tablet by mouth daily.  . VOLTAREN 1 % GEL APPLY ONE APPLICATION TOPICALLY 3 TIMES DAILY  . [DISCONTINUED] furosemide (LASIX) 20 MG tablet Take 1 tablet (20 mg total) by mouth daily.  . [DISCONTINUED] losartan (COZAAR) 100 MG tablet Take 0.5 tablets (50 mg total) by mouth daily.  . [DISCONTINUED] losartan (COZAAR) 50  MG tablet Take 1 tablet (50 mg total) by mouth 2 (two) times daily.     Allergies:   Prednisone, Ace inhibitors, Alendronate sodium, Losartan, Other, Paroxetine hcl, Risedronate sodium, Silenor [doxepin hcl], Simvastatin, Sulfonamide derivatives, Toprol xl [metoprolol], Tramadol, and Influenza vaccines   Social History   Socioeconomic History  . Marital status: Married    Spouse name: Not on file  . Number of children: 2  . Years of education: Not on file  . Highest education level: Not on file  Occupational History  . Occupation: Retired  Employer: RETIRED  Tobacco Use  . Smoking status: Never Smoker  . Smokeless tobacco: Never Used  Vaping Use  . Vaping Use: Never used  Substance and Sexual Activity  . Alcohol use: No  . Drug use: No  . Sexual activity: Not Currently  Other Topics Concern  . Not on file  Social History Narrative   Left handed   Widow. Husband ([name]) deceased 03-19-16 from dementia. She was caregiver.    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 Difficulty of Paying Living Expenses: Not on file  Food Insecurity:   . Worried About Charity fundraiser in the Last Year: Not on file  . Ran Out of Food in the Last Year: Not on file  Transportation Needs:   . Lack of Transportation (Medical): Not on file  . Lack of Transportation (Non-Medical): Not on file  Physical Activity:   . Days of Exercise per Week: Not on file  . Minutes of Exercise per Session: Not on file  Stress:   . Feeling of Stress : Not on file  Social Connections:   . Frequency of Communication with Friends and Family: Not on file  . Frequency of Social Gatherings with Friends and Family: Not on file  . Attends Religious Services: Not on file  . Active Member of Clubs or  Organizations: Not on file  . Attends Archivist Meetings: Not on file  . Marital Status: Not on file     Family History: The patient's family history includes Breast cancer in her cousin; Coronary artery disease in her brother; Diabetes in her brother and paternal grandfather.  ROS:   Please see the history of present illness.     All other systems reviewed and are negative.  EKGs/Labs/Other Studies Reviewed:    The following studies were reviewed today:   EKG:  EKG is  ordered today.  The ekg ordered today demonstrates sinus rhythm, PACs.  Recent Labs: 03/28/2019: Magnesium 2.1 07/09/2019: ALT 11; Hemoglobin 13.8; Platelets 228.0; TSH 0.84 02/12/2020: BUN 18; Creatinine, Ser 1.04; Potassium 4.2; Sodium 137  Recent Lipid Panel    Component Value Date/Time   CHOL 148 10/16/2018 0810   TRIG 147.0 10/16/2018 0810   HDL 45.10 10/16/2018 0810   CHOLHDL 3 10/16/2018 0810   VLDL 29.4 10/16/2018 0810   LDLCALC 73 10/16/2018 0810   LDLDIRECT 86.0 09/19/2014 1049    Physical Exam:    VS:  BP (!) 160/90 (BP Location: Left Arm, Patient Position: Sitting, Cuff Size: Normal)   Pulse 72   Ht 5\' 1"  (1.549 m)   Wt 161 lb 8 oz (73.3 kg)   SpO2 98%   BMI 30.52 kg/m     Wt Readings from Last 3 Encounters:  03/03/20 161 lb 8 oz (73.3 kg)  01/28/20 159 lb (72.1 kg)  12/21/19 157 lb 6 oz (71.4 kg)     GEN:  Well nourished, well developed in no acute distress HEENT: Normal NECK: No JVD; No carotid bruits LYMPHATICS: No lymphadenopathy CARDIAC: RRR, 3/6 systolic murmur, rubs, gallops RESPIRATORY:  Clear to auscultation without rales, wheezing or rhonchi  ABDOMEN: Soft, non-tender, non-distended MUSCULOSKELETAL: Trace edema; No deformity  SKIN: Warm and dry NEUROLOGIC:  Alert and oriented x 3 PSYCHIATRIC:  Normal affect   ASSESSMENT:    1. Primary hypertension   2. Diastolic dysfunction   3. Aortic valve stenosis, etiology of cardiac valve disease unspecified     PLAN:    In order of problems listed above:  1. Patient with uncontrolled blood pressure.  Consolidate losartan to 100 mg daily.  Start Coreg 6.25 mg twice daily, continue also Aldactone.  Check BMP in 2 weeks 2. Recent echocardiogram with grade 2 diastolic dysfunction.  Long history of hypertension likely etiology.  Continue Aldactone, take Lasix as needed. 3. Systolic murmur, mild aortic stenosis.  Monitor with echocardiograms serially.  Follow-up in 1 month.   Medication Adjustments/Labs and Tests Ordered: Current medicines are reviewed at length with the patient today.  Concerns regarding medicines are outlined above.  Orders Placed This Encounter  Procedures  . Basic metabolic panel  . EKG 12-Lead   Meds ordered this encounter  Medications  . DISCONTD: losartan (COZAAR) 100 MG tablet    Sig: Take 0.5 tablets (50 mg total) by mouth daily.    Dispense:  30 tablet    Refill:  5  . carvedilol (COREG) 6.25 MG tablet    Sig: Take 1 tablet (6.25 mg total) by mouth 2 (two) times daily.    Dispense:  60 tablet    Refill:  5  . furosemide (LASIX) 20 MG tablet    Sig: Take 1 tablet (20 mg total) by mouth as needed.    Dispense:  30 tablet    Refill:  5  . losartan (COZAAR) 100 MG tablet    Sig: Take 1 tablet (100 mg total) by mouth daily.    Dispense:  30 tablet    Refill:  5    Patient Instructions  Medication Instructions:  Your physician has recommended you make the following change in your medication:   1.  CHANGE your losartan (COZAAR) to a 100 MG tablet: Take 1 tablet (100 mg total) by mouth daily. 2.  START taking carvedilol (COREG) 6.25 MG tablet: Take 1 tablet (6.25 mg total) by mouth 2 (two) times daily. 3.  CHANGE your furosemide (LASIX) 20 MG tablet: Take 1 tablet (20 mg total) by mouth ONLY AS NEEDED.  *If you need a refill on your cardiac medications before your next appointment, please call your pharmacy*   Lab Work:  Your physician recommends that you  return for lab work in: 2 weeks.  - Please go to the [facility]. You will check in at the front desk to the right as you walk into the atrium. Valet Parking is offered if needed. - No appointment needed. You may go any day between 7 am and 6 pm.   Testing/Procedures: None Ordered   Follow-Up: At [facility], you and your health needs are our priority.  As part of our continuing mission to provide you with exceptional heart care, we have created designated Provider Care Teams.  These Care Teams include your primary Cardiologist (physician) and Advanced Practice Providers (APPs -  Physician Assistants and Nurse Practitioners) who all work together to provide you with the care you need, when you need it.  We recommend signing up for the patient portal called "MyChart".  Sign up information is provided on this After Visit Summary.  MyChart is used to connect with patients for Virtual Visits (Telemedicine).  Patients are able to view lab/test results, encounter notes, upcoming appointments, etc.  Non-urgent messages can be sent to your provider as well.   To  learn more about what you can do with MyChart, go to [email or web address].    Your next appointment:   4 week(s)  The format for your next appointment:   In Person  Provider:   Kate Sable, MD   Other Instructions      Signed, Kate Sable, MD  03/03/2020 4:57 PM    [place]

## 2020-03-14 ENCOUNTER — Telehealth: Payer: Self-pay | Admitting: Cardiology

## 2020-03-14 NOTE — Telephone Encounter (Signed)
Patient daughter calling Would like to know if patient can have lab orders switched to Dr Gutierrez's office Patient will be going there next week and would like to complete at the same time  Please call to discuss or message on mychart

## 2020-03-14 NOTE — Telephone Encounter (Signed)
Routing to Dr. Danise Mina to see if he is okay with ordering patients BMP to be drawn at patients OV with him on 03/28/20.

## 2020-03-16 ENCOUNTER — Other Ambulatory Visit: Payer: Self-pay | Admitting: Family Medicine

## 2020-03-16 DIAGNOSIS — M810 Age-related osteoporosis without current pathological fracture: Secondary | ICD-10-CM

## 2020-03-16 DIAGNOSIS — N1831 Chronic kidney disease, stage 3a: Secondary | ICD-10-CM

## 2020-03-16 DIAGNOSIS — E039 Hypothyroidism, unspecified: Secondary | ICD-10-CM

## 2020-03-16 DIAGNOSIS — R7303 Prediabetes: Secondary | ICD-10-CM

## 2020-03-16 DIAGNOSIS — E785 Hyperlipidemia, unspecified: Secondary | ICD-10-CM

## 2020-03-17 ENCOUNTER — Encounter: Payer: Self-pay | Admitting: Family Medicine

## 2020-03-17 ENCOUNTER — Other Ambulatory Visit
Admission: RE | Admit: 2020-03-17 | Discharge: 2020-03-17 | Disposition: A | Discharge status: Home / Self Care | Payer: PPO | Source: Ambulatory Visit | Attending: Cardiology | Admitting: Cardiology

## 2020-03-17 DIAGNOSIS — E785 Hyperlipidemia, unspecified: Secondary | ICD-10-CM | POA: Diagnosis not present

## 2020-03-17 DIAGNOSIS — M810 Age-related osteoporosis without current pathological fracture: Secondary | ICD-10-CM | POA: Diagnosis not present

## 2020-03-17 DIAGNOSIS — R7303 Prediabetes: Secondary | ICD-10-CM

## 2020-03-17 DIAGNOSIS — E039 Hypothyroidism, unspecified: Secondary | ICD-10-CM | POA: Diagnosis not present

## 2020-03-17 DIAGNOSIS — N1831 Chronic kidney disease, stage 3a: Secondary | ICD-10-CM | POA: Diagnosis not present

## 2020-03-17 DIAGNOSIS — I10 Essential (primary) hypertension: Secondary | ICD-10-CM

## 2020-03-17 LAB — LIPID PANEL
Cholesterol: 186 mg/dL (ref 0–200)
HDL: 52 mg/dL (ref >40)
LDL Cholesterol: 107 mg/dL — ABNORMAL HIGH (ref 0–99)
Total CHOL/HDL Ratio: 3.6 RATIO
Triglycerides: 137 mg/dL (ref <150)
VLDL: 27 mg/dL (ref 0–40)

## 2020-03-17 LAB — CBC WITH DIFFERENTIAL/PLATELET
Abs Immature Granulocytes: 0.02 10*3/uL (ref 0.00–0.07)
Basophils Absolute: 0 10*3/uL (ref 0.0–0.1)
Basophils Relative: 0 %
Eosinophils Absolute: 0.5 10*3/uL (ref 0.0–0.5)
Eosinophils Relative: 7 %
HCT: 38.8 % (ref 36.0–46.0)
Hemoglobin: 12.9 g/dL (ref 12.0–15.0)
Immature Granulocytes: 0 %
Lymphocytes Relative: 30 %
Lymphs Abs: 2.1 10*3/uL (ref 0.7–4.0)
MCH: 30.4 pg (ref 26.0–34.0)
MCHC: 33.2 g/dL (ref 30.0–36.0)
MCV: 91.3 fL (ref 80.0–100.0)
Monocytes Absolute: 1 10*3/uL (ref 0.1–1.0)
Monocytes Relative: 14 %
Neutro Abs: 3.5 10*3/uL (ref 1.7–7.7)
Neutrophils Relative %: 49 %
Platelets: 213 10*3/uL (ref 150–400)
RBC: 4.25 MIL/uL (ref 3.87–5.11)
RDW: 14.2 % (ref 11.5–15.5)
WBC: 7.1 10*3/uL (ref 4.0–10.5)
nRBC: 0 % (ref 0.0–0.2)

## 2020-03-17 LAB — COMPREHENSIVE METABOLIC PANEL
ALT: 14 U/L (ref 0–44)
AST: 18 U/L (ref 15–41)
Albumin: 3.8 g/dL (ref 3.5–5.0)
Alkaline Phosphatase: 76 U/L (ref 38–126)
Anion gap: 9 (ref 5–15)
BUN: 20 mg/dL (ref 8–23)
CO2: 25 mmol/L (ref 22–32)
Calcium: 9.2 mg/dL (ref 8.9–10.3)
Chloride: 98 mmol/L (ref 98–111)
Creatinine, Ser: 0.97 mg/dL (ref 0.44–1.00)
GFR, Estimated: 57 mL/min — ABNORMAL LOW (ref >60)
Glucose, Bld: 110 mg/dL — ABNORMAL HIGH (ref 70–99)
Potassium: 4.5 mmol/L (ref 3.5–5.1)
Sodium: 132 mmol/L — ABNORMAL LOW (ref 135–145)
Total Bilirubin: 0.9 mg/dL (ref 0.3–1.2)
Total Protein: 6.7 g/dL (ref 6.5–8.1)

## 2020-03-17 LAB — VITAMIN D 25 HYDROXY (VIT D DEFICIENCY, FRACTURES): Vit D, 25-Hydroxy: 39.72 ng/mL (ref 30–100)

## 2020-03-17 LAB — TSH: TSH: 0.622 u[IU]/mL (ref 0.350–4.500)

## 2020-03-18 LAB — HEMOGLOBIN A1C
Hgb A1c MFr Bld: 5.7 % — ABNORMAL HIGH (ref 4.8–5.6)
Mean Plasma Glucose: 116.89 mg/dL

## 2020-03-18 NOTE — Telephone Encounter (Signed)
C/x 03/19/20 lab appt per pt request since they have already been drawn.  FYI to Dr. Darnell Level.

## 2020-03-19 ENCOUNTER — Other Ambulatory Visit: Payer: PPO

## 2020-03-27 MED ORDER — CARVEDILOL 12.5 MG PO TABS: 12.5000 mg | ORAL_TABLET | Freq: Two times a day (BID) | ORAL | 3 refills | Status: DC

## 2020-03-27 NOTE — Telephone Encounter (Signed)
Noted  

## 2020-03-27 NOTE — Telephone Encounter (Addendum)
Patient had labs done at [facility]

## 2020-03-28 ENCOUNTER — Ambulatory Visit (INDEPENDENT_AMBULATORY_CARE_PROVIDER_SITE_OTHER): Payer: PPO | Admitting: Family Medicine

## 2020-03-28 ENCOUNTER — Telehealth: Payer: Self-pay

## 2020-03-28 ENCOUNTER — Other Ambulatory Visit: Payer: Self-pay

## 2020-03-28 ENCOUNTER — Encounter: Payer: Self-pay | Admitting: Family Medicine

## 2020-03-28 VITALS — BP 122/78 | HR 47 | Temp 97.8°F | Ht 60.5 in | Wt 157.4 lb

## 2020-03-28 DIAGNOSIS — G8929 Other chronic pain: Secondary | ICD-10-CM

## 2020-03-28 DIAGNOSIS — K623 Rectal prolapse: Secondary | ICD-10-CM

## 2020-03-28 DIAGNOSIS — I6529 Occlusion and stenosis of unspecified carotid artery: Secondary | ICD-10-CM

## 2020-03-28 DIAGNOSIS — R7303 Prediabetes: Secondary | ICD-10-CM

## 2020-03-28 DIAGNOSIS — H409 Unspecified glaucoma: Secondary | ICD-10-CM

## 2020-03-28 DIAGNOSIS — H3530 Unspecified macular degeneration: Secondary | ICD-10-CM

## 2020-03-28 DIAGNOSIS — K581 Irritable bowel syndrome with constipation: Secondary | ICD-10-CM

## 2020-03-28 DIAGNOSIS — M810 Age-related osteoporosis without current pathological fracture: Secondary | ICD-10-CM

## 2020-03-28 DIAGNOSIS — G2581 Restless legs syndrome: Secondary | ICD-10-CM

## 2020-03-28 DIAGNOSIS — M1120 Other chondrocalcinosis, unspecified site: Secondary | ICD-10-CM

## 2020-03-28 DIAGNOSIS — Z0000 Encounter for general adult medical examination without abnormal findings: Secondary | ICD-10-CM | POA: Diagnosis not present

## 2020-03-28 DIAGNOSIS — E039 Hypothyroidism, unspecified: Secondary | ICD-10-CM

## 2020-03-28 DIAGNOSIS — Z96612 Presence of left artificial shoulder joint: Secondary | ICD-10-CM

## 2020-03-28 DIAGNOSIS — N1831 Chronic kidney disease, stage 3a: Secondary | ICD-10-CM

## 2020-03-28 DIAGNOSIS — K219 Gastro-esophageal reflux disease without esophagitis: Secondary | ICD-10-CM

## 2020-03-28 DIAGNOSIS — Z7189 Other specified counseling: Secondary | ICD-10-CM

## 2020-03-28 DIAGNOSIS — I10 Essential (primary) hypertension: Secondary | ICD-10-CM

## 2020-03-28 DIAGNOSIS — K5909 Other constipation: Secondary | ICD-10-CM

## 2020-03-28 DIAGNOSIS — E785 Hyperlipidemia, unspecified: Secondary | ICD-10-CM

## 2020-03-28 NOTE — Progress Notes (Signed)
Patient ID: Brooke Beasley, female    DOB: Feb 09, 1931, 86 y.o.   MRN: [id number]  This visit was conducted in person.  BP 122/78 (BP Location: Left Arm, Patient Position: Sitting, Cuff Size: Normal)   Pulse (!) 47   Temp 97.8 F (36.6 C) (Temporal)   Ht 5' 0.5" (1.537 m)   Wt 157 lb 6 oz (71.4 kg)   SpO2 96%   BMI 30.23 kg/m   Pulse Readings from Last 3 Encounters:  03/28/20 (!) 47  03/03/20 72  01/28/20 75  Pulse 47-50 on recheck CC: CPE Subjective:   HPI: Brooke Beasley is a 86 y.o. female presenting on 03/28/2020 for Medicare Wellness (Pt accompanied by daughter, Brooke Beasley- temp 97.7.)   Did not see health advisor this year.    Hearing Screening   125Hz  250Hz  500Hz  1000Hz  2000Hz  3000Hz  4000Hz  6000Hz  8000Hz   Right ear:           Left ear:           Comments: Wears bilateral hearing aids.  Wearing at today's OV.   Vision Screening Comments: Last eye exam, 10/2019.    Office Visit from 03/28/2020 in [place] at [place]  PHQ-2 Total Score 1      Fall Risk  03/28/2020 10/20/2018 10/13/2017 10/08/2016 10/07/2015  Falls in the past year? 0 0 Yes No No  Comment - - pt fell after losing balance - -  Number falls in past yr: - - 1 - -  Injury with Fall? - - No - -  Risk for fall due to : - - Impaired balance/gait - -   Difficult to control hypertension due to multiple drug intolerances - established with cardiology (Dr Garen Lah). Currently on carvedilol 12.5mg  bid, losartan 100mg  daily, spironolactone 25mg  daily, lasix 20mg  PRN (not regularly taking). Has f/u cards appt scheduled next week.   Chronic constipation - managed with [facility] supplement with benefit. Avoids seeds so would have trouble tolerating kiwi. Miralax may have caused cramping. Currently manages with MOM 3 capfuls daily.   Preventative: Colonoscopy 08/2019 - 2 SSP, TA, hemorrhoids, diverticulosis, rectal prolapse ([place]) Well woman - last done about 12 yrs ago.Aged  out. Mammogram5/2018- WNL. Due for repeat - # provided Lung cancer screen - not eligible  DEXA - 2011 osteoporosis. Pt does not take calcium(causes constipation)but does take vit D.Fosamax caused bone pain. prolia started 04/2019, latest shot 11/08/2019.  DEXA forearm T -4.2, hip -1.4 (01/2018) Flu shot - declines - nervous breakdown after flu shot remotely.  Pneumovax 2004. Prevnar 05/2013.  [place] 07/2019, 08/2019 Td 2010  Zostavax 2012  Shingrix - discussed  Advanced directives - scanned into chart 10/2015. Daughter Brooke Beasley then Brooke Beasley are HCPOA. Does not want life sustaining measures if terminally ill.  Seat belt use discussed Sunscreen use discussed. No changing moles on skin. Non smoker Alcohol - none Dentist - does not see, has full dentures Eye exam - Q6 mo - glaucoma Bowel - chronic issue - see above Bladder - no significant accidents   Left handed Widow. Husband ([name]) deceased 03-28-16 from dementia. She was caregiver.  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 Interim medical history since our last visit reviewed. Allergies and medications reviewed and updated. Outpatient  Medications Prior to Visit  Medication Sig Dispense Refill  . acetaminophen (TYLENOL) 500 MG tablet Take 500-1,000 mg by mouth at bedtime. Depends on pain    . albuterol (PROVENTIL HFA;VENTOLIN HFA) 108 (90 Base) MCG/ACT inhaler Inhale 2 puffs into the lungs every 6 (six) hours as needed for wheezing or shortness of breath. 1 Inhaler 0  . bimatoprost (LUMIGAN) 0.01 % SOLN Place 1 drop into both eyes at bedtime.    Marland Kitchen Bioflavonoid Products (VITAMIN C) CHEW Chew 1 tablet by mouth daily.    . carvedilol (COREG) 12.5 MG tablet Take 1 tablet  (12.5 mg total) by mouth 2 (two) times daily. 180 tablet 3  . cholecalciferol (VITAMIN D) 1000 units tablet Take 1,000 Units by mouth daily.    . clonazePAM (KLONOPIN) 0.5 MG tablet Take 1 tablet (0.5 mg total) by mouth at bedtime. 30 tablet 3  . colchicine 0.6 MG tablet Take 1 tablet (0.6 mg total) by mouth daily as needed (gout flare). 90 tablet 0  . denosumab (PROLIA) 60 MG/ML SOSY injection Inject 60 mg into the skin every 6 (six) months. 1 mL 1  . EUTHYROX 100 MCG tablet Take 1 tablet by mouth once daily 90 tablet 1  . fish oil-omega-3 fatty acids 1000 MG capsule Take 1 g by mouth daily.     . furosemide (LASIX) 20 MG tablet Take 1 tablet (20 mg total) by mouth as needed. 30 tablet 5  . HYDROcodone-acetaminophen (NORCO/VICODIN) 5-325 MG tablet Take 1-2 tablets by mouth every 6 (six) hours as needed. 6 tablet 0  . hydrocortisone (ANUSOL-HC) 2.5 % rectal cream Place 1 application rectally 2 (two) times daily. 30 g 0  . losartan (COZAAR) 100 MG tablet Take 1 tablet (100 mg total) by mouth daily. 30 tablet 5  . methocarbamol (ROBAXIN) 500 MG tablet Take 1 tablet (500 mg total) by mouth every 8 (eight) hours as needed for muscle spasms. 8 tablet 0  . Misc Natural Products (OSTEO BI-FLEX TRIPLE STRENGTH PO) Take 1 tablet by mouth daily. With Vit D    . MISC NATURAL PRODUCTS PO Take 2 capsules by mouth 3 (three) times daily. [employer]    . Multiple Vitamins-Minerals (PRESERVISION AREDS 2+MULTI VIT) CAPS Take 1 capsule by mouth daily.    Marland Kitchen omeprazole (PRILOSEC) 20 MG capsule Take 1 capsule (20 mg total) by mouth daily. 90 capsule 3  . promethazine-phenylephrine (PROMETHAZINE VC) 6.25-5 MG/5ML SYRP Take 5 mLs by mouth every 4 (four) hours as needed for congestion. 118 mL 0  . sertraline (ZOLOFT) 100 MG tablet Take 1 tablet (100 mg total) by mouth daily. 90 tablet 3  . spironolactone (ALDACTONE) 25 MG tablet Take 1 tablet (25 mg total) by mouth daily. 30 tablet 6  . TURMERIC PO Take 1 tablet by  mouth daily.    Marland Kitchen VITAMIN E PO Take 1 tablet by mouth daily.    . VOLTAREN 1 % GEL APPLY ONE APPLICATION TOPICALLY 3 TIMES DAILY 100 g 3  . amLODipine (NORVASC) 10 MG tablet Take 10 mg by mouth daily.     No facility-administered medications prior to visit.     Per HPI unless specifically indicated in ROS section below Review of Systems  Constitutional: Negative for activity change, appetite change, chills, fatigue, fever and unexpected weight change.       Some night sweats  HENT: Negative for hearing loss.   Eyes: Negative for visual disturbance.  Respiratory: Positive for cough. Negative for chest tightness, shortness  of breath and wheezing.   Cardiovascular: Negative for chest pain, palpitations and leg swelling.  Gastrointestinal: Positive for abdominal pain, constipation (alternating) and diarrhea. Negative for abdominal distention, blood in stool, nausea and vomiting.  Genitourinary: Negative for difficulty urinating and hematuria.  Musculoskeletal: Negative for arthralgias, myalgias and neck pain.  Skin: Negative for rash.  Neurological: Negative for dizziness, seizures, syncope and headaches.  Hematological: Negative for adenopathy. Does not bruise/bleed easily.  Psychiatric/Behavioral: Negative for dysphoric Beasley. The patient is not nervous/anxious.    Objective:  BP 122/78 (BP Location: Left Arm, Patient Position: Sitting, Cuff Size: Normal)   Pulse (!) 47   Temp 97.8 F (36.6 C) (Temporal)   Ht 5' 0.5" (1.537 m)   Wt 157 lb 6 oz (71.4 kg)   SpO2 96%   BMI 30.23 kg/m   Wt Readings from Last 3 Encounters:  03/28/20 157 lb 6 oz (71.4 kg)  03/03/20 161 lb 8 oz (73.3 kg)  01/28/20 159 lb (72.1 kg)      Physical Exam Vitals and nursing note reviewed.  Constitutional:      General: She is not in acute distress.    Appearance: Normal appearance. She is well-developed. She is not ill-appearing.  HENT:     Head: Normocephalic and atraumatic.     Right Ear: Hearing,  tympanic membrane, ear canal and external ear normal.     Left Ear: Hearing, tympanic membrane, ear canal and external ear normal.     Ears:     Comments: Wears hearing aides Eyes:     General: No scleral icterus.    Conjunctiva/sclera: Conjunctivae normal.     Pupils: Pupils are equal, round, and reactive to light.  Neck:     Thyroid: No thyroid mass or thyromegaly.     Vascular: No carotid bruit.  Cardiovascular:     Rate and Rhythm: Normal rate and regular rhythm.     Pulses: Normal pulses.          Radial pulses are 2+ on the right side and 2+ on the left side.     Heart sounds: Normal heart sounds. No murmur heard.   Pulmonary:     Effort: Pulmonary effort is normal. No respiratory distress.     Breath sounds: Normal breath sounds. No wheezing, rhonchi or rales.  Abdominal:     General: Abdomen is flat. Bowel sounds are normal. There is no distension.     Palpations: Abdomen is soft. There is no mass.     Tenderness: There is no abdominal tenderness. There is no guarding or rebound.     Hernia: No hernia is present.  Musculoskeletal:        General: Normal range of motion.     Cervical back: Normal range of motion and neck supple.     Right lower leg: No edema.     Left lower leg: No edema.  Lymphadenopathy:     Cervical: No cervical adenopathy.  Skin:    General: Skin is warm and dry.     Findings: No rash.  Neurological:     General: No focal deficit present.     Mental Status: She is alert and oriented to person, place, and time.     Comments:  CN grossly intact, station and gait intact Recall 3/3  Calculation DLROW  Psychiatric:        Beasley and Affect: Beasley normal.        Behavior: Behavior normal.  Thought Content: Thought content normal.        Judgment: Judgment normal.       Results for orders placed or performed during the hospital encounter of 03/17/20  CBC with Differential/Platelet  Result Value Ref Range   WBC 7.1 4.0 - 10.5 K/uL   RBC  4.25 3.87 - 5.11 MIL/uL   Hemoglobin 12.9 12.0 - 15.0 g/dL   HCT 38.8 36 - 46 %   MCV 91.3 80.0 - 100.0 fL   MCH 30.4 26.0 - 34.0 pg   MCHC 33.2 30.0 - 36.0 g/dL   RDW 14.2 11.5 - 15.5 %   Platelets 213 150 - 400 K/uL   nRBC 0.0 0.0 - 0.2 %   Neutrophils Relative % 49 %   Neutro Abs 3.5 1.7 - 7.7 K/uL   Lymphocytes Relative 30 %   Lymphs Abs 2.1 0.7 - 4.0 K/uL   Monocytes Relative 14 %   Monocytes Absolute 1.0 0.1 - 1.0 K/uL   Eosinophils Relative 7 %   Eosinophils Absolute 0.5 0.0 - 0.5 K/uL   Basophils Relative 0 %   Basophils Absolute 0.0 0.0 - 0.1 K/uL   Immature Granulocytes 0 %   Abs Immature Granulocytes 0.02 0.00 - 0.07 K/uL  Hemoglobin A1c  Result Value Ref Range   Hgb A1c MFr Bld 5.7 (H) 4.8 - 5.6 %   Mean Plasma Glucose 116.89 mg/dL  VITAMIN D 25 Hydroxy (Vit-D Deficiency, Fractures)  Result Value Ref Range   Vit D, 25-Hydroxy 39.72 30 - 100 ng/mL  TSH  Result Value Ref Range   TSH 0.622 0.350 - 4.500 uIU/mL  Comprehensive metabolic panel  Result Value Ref Range   Sodium 132 (L) 135 - 145 mmol/L   Potassium 4.5 3.5 - 5.1 mmol/L   Chloride 98 98 - 111 mmol/L   CO2 25 22 - 32 mmol/L   Glucose, Bld 110 (H) 70 - 99 mg/dL   BUN 20 8 - 23 mg/dL   Creatinine, Ser 0.97 0.44 - 1.00 mg/dL   Calcium 9.2 8.9 - 10.3 mg/dL   Total Protein 6.7 6.5 - 8.1 g/dL   Albumin 3.8 3.5 - 5.0 g/dL   AST 18 15 - 41 U/L   ALT 14 0 - 44 U/L   Alkaline Phosphatase 76 38 - 126 U/L   Total Bilirubin 0.9 0.3 - 1.2 mg/dL   GFR, Estimated 57 (L) >60 mL/min   Anion gap 9 5 - 15  Lipid panel  Result Value Ref Range   Cholesterol 186 0 - 200 mg/dL   Triglycerides 137 <150 mg/dL   HDL 52 >40 mg/dL   Total CHOL/HDL Ratio 3.6 RATIO   VLDL 27 0 - 40 mg/dL   LDL Cholesterol 107 (H) 0 - 99 mg/dL   Assessment & Plan:  This visit occurred during the SARS-CoV-2 public health emergency.  Safety protocols were in place, including screening questions prior to the visit, additional usage of staff  PPE, and extensive cleaning of exam room while observing appropriate contact time as indicated for disinfecting solutions.   Problem List Items Addressed This Visit    Status post total shoulder replacement   Restless legs    Continues klonopin 0.5mg  nightly.       Rectal prolapse    Decided to manage conservatively.       Pseudogout   Prediabetes    Will continue to monitor this. Cited 10% risk of progression.       Osteoporosis  DEXA forearm T -4.2, hip -1.4 (01/2018) Continue Q19mo prolia, last done 11/2019 Will update DEXA.       Relevant Orders   DG Bone Density   Medicare annual wellness visit, subsequent - Primary    I have personally reviewed the Medicare Annual Wellness questionnaire and have noted 1. The patient's medical and social history 2. Their use of alcohol, tobacco or illicit drugs 3. Their current medications and supplements 4. The patient's functional ability including ADL's, fall risks, home safety risks and hearing or visual impairment. Cognitive function has been assessed and addressed as indicated.  5. Diet and physical activity 6. Evidence for depression or Beasley disorders The patients weight, height, BMI have been recorded in the chart. I have made referrals, counseling and provided education to the patient based on review of the above and I have provided the pt with a written personalized care plan for preventive services. Provider list updated.. See scanned questionairre as needed for further documentation. Reviewed preventative protocols and updated unless pt declined.       Macular degeneration    Now on AREDS2 Preservision MVI.       Irritable bowel syndrome with constipation   Hypothyroidism    Chronic, stable. Continue current regimen.       Hypertension, essential    Chronic, some improvement on current regimen but now bradycardic. She will f/u with  Cardiology later this month for antihypertensive titration.       HLD  (hyperlipidemia)    Chronic, mild, stable off statin. No indication to start. Consider stopping screening.  The ASCVD Risk score Mikey Bussing DC Jr., et al., 2013) failed to calculate for the following reasons:   The 2013 ASCVD risk score is only valid for ages 60 to 47       Health maintenance examination    Preventative protocols reviewed and updated unless pt declined. Discussed healthy diet and lifestyle.       Glaucoma    Continues regularly seeing eye doctor       GERD (gastroesophageal reflux disease)    Chronic, stable - continue omeprazole 20mg  daily.       CKD (chronic kidney disease) stage 3, GFR 30-59 ml/min (HCC)    Continue losartan 100mg  daily.      Chronic constipation with overflow incontinence   Chronic abdominal pain   Carotid stenosis    Not appreciated today.       Advanced care planning/counseling discussion    Advanced directives - scanned into chart 10/2015. Daughter Brooke Beasley then Brooke Beasley are HCPOA. Does not want life sustaining measures if terminally ill.           No orders of the defined types were placed in this encounter.  Orders Placed This Encounter  Procedures  . DG Bone Density    Standing Status:   Future    Standing Expiration Date:   03/29/2021    Order Specific Question:   Reason for Exam (SYMPTOM  OR DIAGNOSIS REQUIRED)    Answer:   f/u osteoporosis on prolia    Order Specific Question:   Preferred imaging location?    Answer:   [facility]    Patient instructions: Call to schedule mammogram at your convenience: [place] [phone] Continue prolia shots every 6 months.  Good to see you today, return as needed or in 6 months for follow up visit  Keep an eye on pulse, let cardiology known if ongoing.   Follow up plan:  Return in about 6 months (around 09/26/2020) for follow up visit.  [name], MD

## 2020-03-28 NOTE — Assessment & Plan Note (Signed)
Advanced directives - scanned into chart 10/2015. Daughter Antoine Primas then Wyline Mood are HCPOA. Does not want life sustaining measures if terminally ill.

## 2020-03-28 NOTE — Telephone Encounter (Signed)
Called and left a VM per DPR on file with MyChart message reply from Dr. Garen Lah, to be sure patient does receive the instructions as copied and pasted below.  BP readings elevated. Increase Coreg to 12.5 mg twice daily.

## 2020-03-28 NOTE — Patient Instructions (Addendum)
Call to schedule mammogram at your convenience: [place] [phone] Continue prolia shots every 6 months.  Good to see you today, return as needed or in 6 months for follow up visit  Keep an eye on pulse, let cardiology known if ongoing.   Health Maintenance After Age 84 After age 17, you are at a higher risk for certain long-term diseases and infections as well as injuries from falls. Falls are a major cause of broken bones and head injuries in people who are older than age 76. Getting regular preventive care can help to keep you healthy and well. Preventive care includes getting regular testing and making lifestyle changes as recommended by your health care provider. Talk with your health care provider about:  Which screenings and tests you should have. A screening is a test that checks for a disease when you have no symptoms.  A diet and exercise plan that is right for you. What should I know about screenings and tests to prevent falls? Screening and testing are the best ways to find a health problem early. Early diagnosis and treatment give you the best chance of managing medical conditions that are common after age 29. Certain conditions and lifestyle choices may make you more likely to have a fall. Your health care provider may recommend:  Regular vision checks. Poor vision and conditions such as cataracts can make you more likely to have a fall. If you wear glasses, make sure to get your prescription updated if your vision changes.  Medicine review. Work with your health care provider to regularly review all of the medicines you are taking, including over-the-counter medicines. Ask your health care provider about any side effects that may make you more likely to have a fall. Tell your health care provider if any medicines that you take make you feel dizzy or sleepy.  Osteoporosis screening. Osteoporosis is a condition that causes the bones to get weaker. This can make  the bones weak and cause them to break more easily.  Blood pressure screening. Blood pressure changes and medicines to control blood pressure can make you feel dizzy.  Strength and balance checks. Your health care provider may recommend certain tests to check your strength and balance while standing, walking, or changing positions.  Foot health exam. Foot pain and numbness, as well as not wearing proper footwear, can make you more likely to have a fall.  Depression screening. You may be more likely to have a fall if you have a fear of falling, feel emotionally low, or feel unable to do activities that you used to do.  Alcohol use screening. Using too much alcohol can affect your balance and may make you more likely to have a fall. What actions can I take to lower my risk of falls? General instructions  Talk with your health care provider about your risks for falling. Tell your health care provider if: ? You fall. Be sure to tell your health care provider about all falls, even ones that seem minor. ? You feel dizzy, sleepy, or off-balance.  Take over-the-counter and prescription medicines only as told by your health care provider. These include any supplements.  Eat a healthy diet and maintain a healthy weight. A healthy diet includes low-fat dairy products, low-fat (lean) meats, and fiber from whole grains, beans, and lots of fruits and vegetables. Home safety  Remove any tripping hazards, such as rugs, cords, and clutter.  Install safety equipment such as grab bars in bathrooms  and safety rails on stairs.  Keep rooms and walkways well-lit. Activity   Follow a regular exercise program to stay fit. This will help you maintain your balance. Ask your health care provider what types of exercise are appropriate for you.  If you need a cane or walker, use it as recommended by your health care provider.  Wear supportive shoes that have nonskid soles. Lifestyle  Do not drink alcohol if  your health care provider tells you not to drink.  If you drink alcohol, limit how much you have: ? 0-1 drink a day for women. ? 0-2 drinks a day for men.  Be aware of how much alcohol is in your drink. In the [place] one drink equals one typical bottle of beer (12 oz), one-half glass of wine (5 oz), or one shot of hard liquor (1 oz).  Do not use any products that contain nicotine or tobacco, such as cigarettes and e-cigarettes. If you need help quitting, ask your health care provider. Summary  Having a healthy lifestyle and getting preventive care can help to protect your health and wellness after age 44.  Screening and testing are the best way to find a health problem early and help you avoid having a fall. Early diagnosis and treatment give you the best chance for managing medical conditions that are more common for people who are older than age 89.  Falls are a major cause of broken bones and head injuries in people who are older than age 73. Take precautions to prevent a fall at home.  Work with your health care provider to learn what changes you can make to improve your health and wellness and to prevent falls. This information is not intended to replace advice given to you by your health care provider. Make sure you discuss any questions you have with your health care provider. Document Revised: 08/03/2018 Document Reviewed: 02/23/2017 Elsevier Patient Education  2020 [employer].

## 2020-03-28 NOTE — Assessment & Plan Note (Signed)

## 2020-03-29 NOTE — Assessment & Plan Note (Signed)
Not appreciated today.  

## 2020-03-29 NOTE — Assessment & Plan Note (Addendum)
Continues regularly seeing eye doctor

## 2020-03-29 NOTE — Assessment & Plan Note (Signed)
Decided to manage conservatively.

## 2020-03-29 NOTE — Assessment & Plan Note (Signed)
Preventative protocols reviewed and updated unless pt declined. Discussed healthy diet and lifestyle.  

## 2020-03-29 NOTE — Assessment & Plan Note (Signed)
Now on AREDS2 Preservision MVI.

## 2020-03-29 NOTE — Assessment & Plan Note (Addendum)
Chronic, some improvement on current regimen but now bradycardic. She will f/u with  Cardiology later this month for antihypertensive titration.

## 2020-03-29 NOTE — Assessment & Plan Note (Signed)
Chronic, stable - continue omeprazole 20mg  daily.

## 2020-03-29 NOTE — Assessment & Plan Note (Addendum)
DEXA forearm T -4.2, hip -1.4 (01/2018) Continue Q7mo prolia, last done 11/2019 Will update DEXA.

## 2020-03-29 NOTE — Assessment & Plan Note (Signed)
Will continue to monitor this. Cited 10% risk of progression.

## 2020-03-29 NOTE — Assessment & Plan Note (Signed)
Continues klonopin 0.5mg  nightly.

## 2020-03-29 NOTE — Assessment & Plan Note (Signed)
Continue losartan 100mg  daily.

## 2020-03-29 NOTE — Assessment & Plan Note (Addendum)
Chronic, mild, stable off statin. No indication to start. Consider stopping screening.  The ASCVD Risk score Brooke Bussing DC Jr., et al., 2013) failed to calculate for the following reasons:   The 2013 ASCVD risk score is only valid for ages 38 to 65

## 2020-03-29 NOTE — Assessment & Plan Note (Signed)
Chronic, stable. Continue current regimen. 

## 2020-04-04 ENCOUNTER — Other Ambulatory Visit: Payer: Self-pay

## 2020-04-04 ENCOUNTER — Encounter: Payer: Self-pay | Admitting: Cardiology

## 2020-04-04 ENCOUNTER — Ambulatory Visit: Payer: PPO | Admitting: Cardiology

## 2020-04-04 VITALS — BP 148/84 | HR 51 | Ht 60.05 in | Wt 162.0 lb

## 2020-04-04 DIAGNOSIS — I10 Essential (primary) hypertension: Secondary | ICD-10-CM | POA: Diagnosis not present

## 2020-04-04 DIAGNOSIS — I350 Nonrheumatic aortic (valve) stenosis: Secondary | ICD-10-CM

## 2020-04-04 DIAGNOSIS — I5189 Other ill-defined heart diseases: Secondary | ICD-10-CM | POA: Diagnosis not present

## 2020-04-04 MED ORDER — IRBESARTAN 300 MG PO TABS: 300.0000 mg | ORAL_TABLET | Freq: Every day | ORAL | 5 refills | Status: DC

## 2020-04-04 MED ORDER — CARVEDILOL 6.25 MG PO TABS
6.2500 mg | ORAL_TABLET | Freq: Two times a day (BID) | ORAL | 5 refills | Status: DC
Start: 2020-04-04 — End: 2020-08-15

## 2020-04-04 NOTE — Progress Notes (Signed)
Cardiology Office Note:    Date:  04/04/2020   ID:  Brooke Beasley, DOB 01/26/1931, MRN [id number]  PCP:  Brooke Bush, MD  [place] Cardiologist:  No primary care provider on file.  [facility] Electrophysiologist:  None   Referring MD: Brooke Bush, MD   Chief Complaint  Patient presents with  . Follow-up    4 weeks--pt sent attachment in MyChart message with BP readings    History of Present Illness:    Brooke Beasley is a 86 y.o. female with a hx of anxiety, hypertension, hyperlipidemia, who presents for follow-up.  Patient last seen due to elevated blood pressures.    Coreg was increased to 12.5 mg twice daily, patient states having symptoms of fatigue while on increased doses of Coreg.  Her blood pressure readings at home were 120s to 170s.  Prior notes Echocardiogram on 11/26/2019 showed normal systolic function, EF 60 to 16%, grade 2 diastolic dysfunction.  Mild to moderate MR, mild MS.  Patient previously took lisinopril, but stopped due to side effects/cough.  Past Medical History:  Diagnosis Date  . Anxiety   . CAP (community acquired pneumonia) 12/22/2017  . Carotid stenosis 10/18/2014   R 1-39%, L 40-59%, rpt 1 yr (09/2014)   . CKD (chronic kidney disease) stage 3, GFR 30-59 ml/min (HCC) 08/02/2014  . Clostridioides difficile infection    hx 2020  . Degenerative disc disease    LS  . Depression    nervous breakdonw in 1964-out of work for a year  . Diverticulosis    severe by colonoscopy  . Family history of adverse reaction to anesthesia    n/v  . GERD (gastroesophageal reflux disease)   . Glaucoma   . Heart murmur 5/13   mitral regurge - on echo   . History of hiatal hernia   . History of shingles   . HTN (hypertension)   . Hyperlipidemia   . Hypertension   . Hypothyroidism   . IBS 11/02/2006  . Intestinal bacterial overgrowth    In small colon  . Maxillary fracture ([place]) 04/08/2012  . Osteoarthritis 2016    Jefm Bryant)  . Osteoporosis    dexa 2011  . Pseudogout 2016   shoulders ([place])  . Pseudogout     Past Surgical History:  Procedure Laterality Date  . barium enema  2012   severe diverticulosis, redundant colon  . Bowel obstruction  1999   no surgery in hosp x 3 days  . COLONOSCOPY  1. 1999  2. 11/04   1. Not finished  2. Slight hemorrhage rectosigmoid area, severe sig diverticulosis  . COLONOSCOPY WITH PROPOFOL N/A 09/10/2019   SSP with dysplasia, TA (Vanga, Tally Due, MD)  . Dexa  1. 9450-3888   2. 9/04  3. 3/08    1. OP  2. OP, borderline, spine -2.44T  3. decreased BMD-OP  . DEXA  2011   OP in spine  . DG KNEE 1-2 VIEWS BILAT     LS x-ray with degenerative disc and facet change  . ESOPHAGOGASTRODUODENOSCOPY     Negative  . Hemorrhoid procedure  4/08  . JOINT REPLACEMENT    . TONSILLECTOMY    . TOTAL SHOULDER ARTHROPLASTY Left 11/27/2015   Corky Mull, MD  . TOTAL SHOULDER REVISION Left 03/16/2016   Procedure: TOTAL SHOULDER REVISION;  Surgeon: Corky Mull, MD;  Location: [facility] ORS;  Service: Orthopedics;  Laterality: Left;  . US ECHOCARDIOGRAPHY  05/8001   Normal systolic fxn  with EF 55-60%.  Focal basal septal hypertrophy.  Mild diastolic dysfunction.  Mild MR.    Current Medications: Current Meds  Medication Sig  . acetaminophen (TYLENOL) 500 MG tablet Take 500-1,000 mg by mouth at bedtime. Depends on pain  . albuterol (PROVENTIL HFA;VENTOLIN HFA) 108 (90 Base) MCG/ACT inhaler Inhale 2 puffs into the lungs every 6 (six) hours as needed for wheezing or shortness of breath.  . bimatoprost (LUMIGAN) 0.01 % SOLN Place 1 drop into both eyes at bedtime.  Marland Kitchen Bioflavonoid Products (VITAMIN C) CHEW Chew 1 tablet by mouth daily.  . cholecalciferol (VITAMIN D) 1000 units tablet Take 1,000 Units by mouth daily.  . clonazePAM (KLONOPIN) 0.5 MG tablet Take 1 tablet (0.5 mg total) by mouth at bedtime.  . colchicine 0.6 MG tablet Take 1 tablet (0.6 mg total) by mouth daily as needed  (gout flare).  Marland Kitchen denosumab (PROLIA) 60 MG/ML SOSY injection Inject 60 mg into the skin every 6 (six) months.  . EUTHYROX 100 MCG tablet Take 1 tablet by mouth once daily  . fish oil-omega-3 fatty acids 1000 MG capsule Take 1 g by mouth daily.   . furosemide (LASIX) 20 MG tablet Take 1 tablet (20 mg total) by mouth as needed.  Marland Kitchen HYDROcodone-acetaminophen (NORCO/VICODIN) 5-325 MG tablet Take 1-2 tablets by mouth every 6 (six) hours as needed.  . hydrocortisone (ANUSOL-HC) 2.5 % rectal cream Place 1 application rectally 2 (two) times daily.  . methocarbamol (ROBAXIN) 500 MG tablet Take 1 tablet (500 mg total) by mouth every 8 (eight) hours as needed for muscle spasms.  . Misc Natural Products (OSTEO BI-FLEX TRIPLE STRENGTH PO) Take 1 tablet by mouth daily. With Vit D  . MISC NATURAL PRODUCTS PO Take 2 capsules by mouth 3 (three) times daily. [employer]  . Multiple Vitamins-Minerals (PRESERVISION AREDS 2+MULTI VIT) CAPS Take 1 capsule by mouth daily.  Marland Kitchen omeprazole (PRILOSEC) 20 MG capsule Take 1 capsule (20 mg total) by mouth daily.  . promethazine-phenylephrine (PROMETHAZINE VC) 6.25-5 MG/5ML SYRP Take 5 mLs by mouth every 4 (four) hours as needed for congestion.  . sertraline (ZOLOFT) 100 MG tablet Take 1 tablet (100 mg total) by mouth daily.  Marland Kitchen spironolactone (ALDACTONE) 25 MG tablet Take 1 tablet (25 mg total) by mouth daily.  . TURMERIC PO Take 1 tablet by mouth daily.  Marland Kitchen VITAMIN E PO Take 1 tablet by mouth daily.  . VOLTAREN 1 % GEL APPLY ONE APPLICATION TOPICALLY 3 TIMES DAILY  . [DISCONTINUED] carvedilol (COREG) 12.5 MG tablet Take 1 tablet (12.5 mg total) by mouth 2 (two) times daily.  . [DISCONTINUED] losartan (COZAAR) 100 MG tablet Take 1 tablet (100 mg total) by mouth daily.     Allergies:   Prednisone, Ace inhibitors, Alendronate sodium, Losartan, Other, Paroxetine hcl, Risedronate sodium, Silenor [doxepin hcl], Simvastatin, Sulfonamide derivatives, Toprol xl [metoprolol], Tramadol,  and Influenza vaccines   Social History   Socioeconomic History  . Marital status: Married    Spouse name: Not on file  . Number of children: 2  . Years of education: Not on file  . Highest education level: Not on file  Occupational History  . Occupation: Retired    Fish farm manager: RETIRED  Tobacco Use  . Smoking status: Never Smoker  . Smokeless tobacco: Never Used  Vaping Use  . Vaping Use: Never used  Substance and Sexual Activity  . Alcohol use: No  . Drug use: No  . Sexual activity: Not Currently  Other Topics Concern  .  Not on file  Social History Narrative   Left handed   Widow. Husband ([name]) deceased Mar 21, 2016 from dementia. She was caregiver.    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  ROS:   Please see the history of present illness.     All other systems reviewed and are negative.  EKGs/Labs/Other Studies Reviewed:    The following studies were reviewed today:   EKG:  EKG is  ordered today.  The ekg ordered today demonstrates sinus rhythm, PACs.  Recent Labs: 03/21/2020: ALT 14; BUN 20; Creatinine, Ser 0.97; Hemoglobin 12.9; Platelets 213; Potassium 4.5; Sodium 132; TSH 0.622  Recent Lipid Panel    Component Value Date/Time   CHOL 186 2020-03-21 1519   TRIG 137 2020-03-21 1519   HDL 52 03/21/20 1519   CHOLHDL 3.6 Mar 21, 2020 1519   VLDL 27 March 21, 2020 1519   LDLCALC 107 (H) Mar 21, 2020 1519   LDLDIRECT 86.0  09/19/2014 1049    Physical Exam:    VS:  BP (!) 148/84   Pulse (!) 51   Ht 5' 0.05" (1.525 m)   Wt 162 lb (73.5 kg)   BMI 31.59 kg/m     Wt Readings from Last 3 Encounters:  04/04/20 162 lb (73.5 kg)  03/28/20 157 lb 6 oz (71.4 kg)  03/03/20 161 lb 8 oz (73.3 kg)     GEN:  Well nourished, well developed in no acute distress HEENT: Normal NECK: No JVD; No carotid bruits LYMPHATICS: No lymphadenopathy CARDIAC: RRR, 3/6 systolic murmur, rubs, gallops RESPIRATORY:  Clear to auscultation without rales, wheezing or rhonchi  ABDOMEN: Soft, non-tender, non-distended MUSCULOSKELETAL: Trace edema; No deformity  SKIN: Warm and dry NEUROLOGIC:  Alert and oriented x 3 PSYCHIATRIC:  Normal affect   ASSESSMENT:    1. Primary hypertension   2. Diastolic dysfunction   3. Aortic valve stenosis, etiology of cardiac valve disease unspecified    PLAN:    In order of problems listed above:  1. Hypertension, BP elevated.  Symptoms of fatigue with increasing Coreg.  Decrease Coreg to 6.25 mg twice daily, continue Aldactone.  Stop losartan, start irbesartan 300 mg daily.  If BP not controlled, consider adding calcium channel blocker/amlodipine. 2. grade 2 diastolic dysfunction.  Long history of hypertension likely etiology.  Currently euvolemic.  Continue Aldactone, take Lasix as needed. 3. Systolic murmur, mild aortic stenosis.  Monitor with echocardiograms serially every 3 to 5 years according to [facility] AHA guidelines.  Last echo 11/2019.  Follow-up in 6 weeks   Medication Adjustments/Labs and Tests Ordered: Current medicines are reviewed at length with the patient today.  Concerns regarding medicines are outlined above.  No orders of the defined types were placed in this encounter.  Meds ordered this encounter  Medications  . carvedilol (COREG) 6.25 MG tablet    Sig: Take 1 tablet (6.25 mg total) by mouth 2 (two) times daily.    Dispense:  60  tablet    Refill:  5    Dosage decrease   . irbesartan (AVAPRO) 300 MG tablet    Sig: Take 1 tablet (300 mg total) by mouth daily.    Dispense:  30 tablet    Refill:  5    STOP Losartan    Patient Instructions  Medication Instructions:  Your physician has recommended you make the following change in your medication:   1) STOP Losartan  2) START Irbesartan 300 mg daily. An Rx has been sent to your pharmacy.  3) REDUCE Carvedilol (coreg) to 6.25 mg twice daily. An Rx has been sent to your pharmacy.  *If you need a refill on your cardiac medications before your next appointment, please call your pharmacy*   Lab Work: None ordered If you have labs (blood work) drawn today and your tests are completely normal, you will receive your results only by: Marland Kitchen MyChart Message (if you have MyChart) OR . A paper copy in the mail If you have any lab test that is abnormal or we need to change your treatment, we will call you to review the results.   Testing/Procedures: None ordered   Follow-Up: At [facility], you and your health needs are our priority.  As part of our continuing mission to provide you with exceptional heart care, we have created designated Provider Care Teams.  These Care Teams include your primary Cardiologist (physician) and Advanced Practice Providers (APPs -  Physician Assistants and Nurse Practitioners) who all work together to provide you with the care you need, when you need it.  We recommend signing up for the patient portal called "MyChart".  Sign up information is provided on this After Visit Summary.  MyChart is used to connect with patients for Virtual Visits (Telemedicine).  Patients are able to view lab/test results, encounter notes, upcoming appointments, etc.  Non-urgent messages can be sent to your provider as well.   To learn more about what you can do with MyChart, go to [email or web address].    Your next appointment:   6 week(s)  The format for your next appointment:   In  Person  Provider:   You may see Dr. Garen Lah  or one of the following Advanced Practice Providers on your designated Care Team:    Murray Hodgkins, NP  Christell Faith, PA-C  Marrianne Mood, PA-C  Cadence [place], [place]  Laurann Montana, NP    Other Instructions N/A     Signed, Kate Sable, MD  04/04/2020 4:43 PM    [place]

## 2020-04-04 NOTE — Patient Instructions (Signed)
Medication Instructions:  Your physician has recommended you make the following change in your medication:   1) STOP Losartan  2) START Irbesartan 300 mg daily. An Rx has been sent to your pharmacy.  3) REDUCE Carvedilol (coreg) to 6.25 mg twice daily. An Rx has been sent to your pharmacy.  *If you need a refill on your cardiac medications before your next appointment, please call your pharmacy*   Lab Work: None ordered If you have labs (blood work) drawn today and your tests are completely normal, you will receive your results only by: Marland Kitchen MyChart Message (if you have MyChart) OR . A paper copy in the mail If you have any lab test that is abnormal or we need to change your treatment, we will call you to review the results.   Testing/Procedures: None ordered   Follow-Up: At [facility], you and your health needs are our priority.  As part of our continuing mission to provide you with exceptional heart care, we have created designated Provider Care Teams.  These Care Teams include your primary Cardiologist (physician) and Advanced Practice Providers (APPs -  Physician Assistants and Nurse Practitioners) who all work together to provide you with the care you need, when you need it.  We recommend signing up for the patient portal called "MyChart".  Sign up information is provided on this After Visit Summary.  MyChart is used to connect with patients for Virtual Visits (Telemedicine).  Patients are able to view lab/test results, encounter notes, upcoming appointments, etc.  Non-urgent messages can be sent to your provider as well.   To learn more about what you can do with MyChart, go to [email or web address].    Your next appointment:   6 week(s)  The format for your next appointment:   In Person  Provider:   You may see Dr. Garen Lah  or one of the following Advanced Practice Providers on your designated Care Team:    Murray Hodgkins, NP  Christell Faith, PA-C  Marrianne Mood, PA-C  Cadence [place], [place]  Laurann Montana, NP    Other Instructions N/A

## 2020-04-14 ENCOUNTER — Other Ambulatory Visit: Payer: Self-pay

## 2020-04-14 DIAGNOSIS — M810 Age-related osteoporosis without current pathological fracture: Secondary | ICD-10-CM

## 2020-04-14 NOTE — Telephone Encounter (Signed)
Received faxed refill request for Prolia from [facility].

## 2020-04-15 ENCOUNTER — Other Ambulatory Visit: Payer: Self-pay | Admitting: Family Medicine

## 2020-04-15 DIAGNOSIS — Z0000 Encounter for general adult medical examination without abnormal findings: Secondary | ICD-10-CM

## 2020-04-20 ENCOUNTER — Other Ambulatory Visit: Payer: Self-pay | Admitting: Family Medicine

## 2020-04-21 NOTE — Telephone Encounter (Signed)
ERx 

## 2020-04-21 NOTE — Telephone Encounter (Signed)
Last filled 03-22-20 #30 Last OV 04-04-20 Next OV 09-29-20 [facility] [address]

## 2020-04-26 ENCOUNTER — Other Ambulatory Visit: Payer: Self-pay | Admitting: Family Medicine

## 2020-04-30 NOTE — Telephone Encounter (Signed)
Contacted Stacey at pharmacy and advised pt is not due for prolia until after 05/11/20. She said it would take 48h to ship to office. Advised a script would be sent at a later date. Misty Stanley verbalized understanding.

## 2020-05-05 ENCOUNTER — Telehealth: Payer: Self-pay

## 2020-05-05 ENCOUNTER — Encounter: Payer: Self-pay | Admitting: Family Medicine

## 2020-05-05 ENCOUNTER — Other Ambulatory Visit: Payer: Self-pay | Admitting: Family Medicine

## 2020-05-05 DIAGNOSIS — M810 Age-related osteoporosis without current pathological fracture: Secondary | ICD-10-CM

## 2020-05-05 MED ORDER — AMLODIPINE BESYLATE 5 MG PO TABS: 5.0000 mg | ORAL_TABLET | Freq: Every day | ORAL | 5 refills | Status: DC

## 2020-05-05 NOTE — Telephone Encounter (Signed)
Spoke with patients daughter per DPR on file. She verbalized understanding and agreed with plan.   Kate Sable, MD  You 16 minutes ago (3:16 PM)   Start amlodipine 5 mg daily. Thank you

## 2020-05-06 MED ORDER — DENOSUMAB 60 MG/ML ~~LOC~~ SOSY: 60.0000 mg | PREFILLED_SYRINGE | SUBCUTANEOUS | 1 refills | Status: DC

## 2020-05-09 ENCOUNTER — Encounter: Payer: Self-pay | Admitting: Gastroenterology

## 2020-05-09 DIAGNOSIS — H52223 Regular astigmatism, bilateral: Secondary | ICD-10-CM | POA: Diagnosis not present

## 2020-05-09 DIAGNOSIS — R197 Diarrhea, unspecified: Secondary | ICD-10-CM

## 2020-05-09 DIAGNOSIS — H04123 Dry eye syndrome of bilateral lacrimal glands: Secondary | ICD-10-CM | POA: Diagnosis not present

## 2020-05-09 DIAGNOSIS — H353131 Nonexudative age-related macular degeneration, bilateral, early dry stage: Secondary | ICD-10-CM | POA: Diagnosis not present

## 2020-05-09 DIAGNOSIS — H401131 Primary open-angle glaucoma, bilateral, mild stage: Secondary | ICD-10-CM | POA: Diagnosis not present

## 2020-05-09 DIAGNOSIS — H524 Presbyopia: Secondary | ICD-10-CM | POA: Diagnosis not present

## 2020-05-09 MED ORDER — AMLODIPINE BESYLATE 10 MG PO TABS
10.0000 mg | ORAL_TABLET | Freq: Every day | ORAL | 5 refills | Status: DC
Start: 2020-05-09 — End: 2020-05-16

## 2020-05-09 NOTE — Telephone Encounter (Signed)
Dr. Garen Lah,  Please review the attachment patient sent. Her BP still remains mildly elevated despite taking amlodipine 5 mg daily for 1 week. Please advise.

## 2020-05-09 NOTE — Addendum Note (Signed)
Addended by: Lamar Laundry on: 05/09/2020 01:07 PM   Modules accepted: Orders

## 2020-05-13 DIAGNOSIS — R197 Diarrhea, unspecified: Secondary | ICD-10-CM | POA: Diagnosis not present

## 2020-05-16 ENCOUNTER — Encounter: Payer: Self-pay | Admitting: Cardiology

## 2020-05-16 ENCOUNTER — Other Ambulatory Visit: Payer: Self-pay

## 2020-05-16 ENCOUNTER — Ambulatory Visit: Payer: PPO | Admitting: Cardiology

## 2020-05-16 VITALS — BP 126/70 | HR 53 | Ht 60.05 in | Wt 162.0 lb

## 2020-05-16 DIAGNOSIS — I5189 Other ill-defined heart diseases: Secondary | ICD-10-CM | POA: Diagnosis not present

## 2020-05-16 DIAGNOSIS — I10 Essential (primary) hypertension: Secondary | ICD-10-CM

## 2020-05-16 DIAGNOSIS — I350 Nonrheumatic aortic (valve) stenosis: Secondary | ICD-10-CM | POA: Diagnosis not present

## 2020-05-16 LAB — GI PROFILE, STOOL, PCR

## 2020-05-16 NOTE — Patient Instructions (Signed)
Medication Instructions:  Your physician recommends that you continue on your current medications as directed. Please refer to the Current Medication list given to you today.  *If you need a refill on your cardiac medications before your next appointment, please call your pharmacy*   Lab Work: None Ordered If you have labs (blood work) drawn today and your tests are completely normal, you will receive your results only by: . MyChart Message (if you have MyChart) OR . A paper copy in the mail If you have any lab test that is abnormal or we need to change your treatment, we will call you to review the results.   Testing/Procedures: None Ordered   Follow-Up: At [facility], you and your health needs are our priority.  As part of our continuing mission to provide you with exceptional heart care, we have created designated Provider Care Teams.  These Care Teams include your primary Cardiologist (physician) and Advanced Practice Providers (APPs -  Physician Assistants and Nurse Practitioners) who all work together to provide you with the care you need, when you need it.  We recommend signing up for the patient portal called "MyChart".  Sign up information is provided on this After Visit Summary.  MyChart is used to connect with patients for Virtual Visits (Telemedicine).  Patients are able to view lab/test results, encounter notes, upcoming appointments, etc.  Non-urgent messages can be sent to your provider as well.   To learn more about what you can do with MyChart, go to [email or web address].    Your next appointment:   3 month(s)  The format for your next appointment:   In Person  Provider:   Brian Agbor-Etang, MD   Other Instructions   

## 2020-05-16 NOTE — Progress Notes (Signed)
Cardiology Office Note:    Date:  05/16/2020   ID:  Brooke Beasley, DOB 06-07-1930, MRN [id number]  PCP:  Brooke Bush, MD  [place] Cardiologist:  No primary care provider on file.  [facility] Electrophysiologist:  None   Referring MD: Brooke Bush, MD   Chief Complaint  Patient presents with  . Follow-up    6 weeks  Pt states no longer taking amlodipine. Daughter states she took her off of it d/t stomach pains. Has not been taking Amlodipine since Monday.    History of Present Illness:    Brooke Beasley is a 87 y.o. female with a hx of anxiety, hypertension, hyperlipidemia, who presents for follow-up.  Patient last seen due to elevated blood pressures.    Patient only able to tolerate up to 6.5 mg twice daily of Coreg.  Higher doses caused fatigue.  Amlodipine 5 was added due to persistently elevated BPs.  She states amlodipine caused abdominal pains.  She has not taken amlodipine over the past 4 days.  Home BP recordings average 272Z systolic.  Prior notes Echocardiogram on 11/26/2019 showed normal systolic function, EF 60 to 36%, grade 2 diastolic dysfunction.  Mild to moderate MR, mild MS.  Patient previously took lisinopril, but stopped due to side effects/cough.  Amlodipine stopped due to abdominal pains  Past Medical History:  Diagnosis Date  . Anxiety   . CAP (community acquired pneumonia) 12/22/2017  . Carotid stenosis 10/18/2014   R 1-39%, L 40-59%, rpt 1 yr (09/2014)   . CKD (chronic kidney disease) stage 3, GFR 30-59 ml/min (HCC) 08/02/2014  . Clostridioides difficile infection    hx 2020  . Degenerative disc disease    LS  . Depression    nervous breakdonw in 1964-out of work for a year  . Diverticulosis    severe by colonoscopy  . Family history of adverse reaction to anesthesia    n/v  . GERD (gastroesophageal reflux disease)   . Glaucoma   . Heart murmur 5/13   mitral regurge - on echo   . History of hiatal hernia   .  History of shingles   . HTN (hypertension)   . Hyperlipidemia   . Hypertension   . Hypothyroidism   . IBS 11/02/2006  . Intestinal bacterial overgrowth    In small colon  . Maxillary fracture ([place]) 04/08/2012  . Osteoarthritis 2016   Jefm Bryant)  . Osteoporosis    dexa 2011  . Pseudogout 2016   shoulders ([place])  . Pseudogout     Past Surgical History:  Procedure Laterality Date  . barium enema  2012   severe diverticulosis, redundant colon  . Bowel obstruction  1999   no surgery in hosp x 3 days  . COLONOSCOPY  1. 1999  2. 11/04   1. Not finished  2. Slight hemorrhage rectosigmoid area, severe sig diverticulosis  . COLONOSCOPY WITH PROPOFOL N/A 09/10/2019   SSP with dysplasia, TA (Vanga, Tally Due, MD)  . Dexa  1. 6440-3474   2. 9/04  3. 3/08    1. OP  2. OP, borderline, spine -2.44T  3. decreased BMD-OP  . DEXA  2011   OP in spine  . DG KNEE 1-2 VIEWS BILAT     LS x-ray with degenerative disc and facet change  . ESOPHAGOGASTRODUODENOSCOPY     Negative  . Hemorrhoid procedure  4/08  . JOINT REPLACEMENT    . TONSILLECTOMY    . TOTAL SHOULDER ARTHROPLASTY Left 11/27/2015  Corky Mull, MD  . TOTAL SHOULDER REVISION Left 03/16/2016   Procedure: TOTAL SHOULDER REVISION;  Surgeon: Corky Mull, MD;  Location: [facility] ORS;  Service: Orthopedics;  Laterality: Left;  . US ECHOCARDIOGRAPHY  05/7033   Normal systolic fxn with EF 00-93%.  Focal basal septal hypertrophy.  Mild diastolic dysfunction.  Mild MR.    Current Medications: Current Meds  Medication Sig  . acetaminophen (TYLENOL) 500 MG tablet Take 500-1,000 mg by mouth at bedtime. Depends on pain  . albuterol (PROVENTIL HFA;VENTOLIN HFA) 108 (90 Base) MCG/ACT inhaler Inhale 2 puffs into the lungs every 6 (six) hours as needed for wheezing or shortness of breath.  . bimatoprost (LUMIGAN) 0.01 % SOLN Place 1 drop into both eyes at bedtime.  Marland Kitchen Bioflavonoid Products (VITAMIN C) CHEW Chew 1 tablet by mouth daily.  .  carvedilol (COREG) 6.25 MG tablet Take 1 tablet (6.25 mg total) by mouth 2 (two) times daily.  . cholecalciferol (VITAMIN D) 1000 units tablet Take 1,000 Units by mouth daily.  . clonazePAM (KLONOPIN) 0.5 MG tablet TAKE 1 TABLET BY MOUTH AT BEDTIME  . colchicine 0.6 MG tablet Take 1 tablet (0.6 mg total) by mouth daily as needed (gout flare).  Marland Kitchen denosumab (PROLIA) 60 MG/ML SOSY injection Inject 60 mg into the skin every 6 (six) months.  . EUTHYROX 100 MCG tablet Take 1 tablet by mouth once daily  . fish oil-omega-3 fatty acids 1000 MG capsule Take 1 g by mouth daily.   . furosemide (LASIX) 20 MG tablet Take 1 tablet (20 mg total) by mouth as needed.  Marland Kitchen HYDROcodone-acetaminophen (NORCO/VICODIN) 5-325 MG tablet Take 1-2 tablets by mouth every 6 (six) hours as needed.  . hydrocortisone (ANUSOL-HC) 2.5 % rectal cream Place 1 application rectally 2 (two) times daily.  . irbesartan (AVAPRO) 300 MG tablet Take 1 tablet (300 mg total) by mouth daily.  . methocarbamol (ROBAXIN) 500 MG tablet Take 1 tablet (500 mg total) by mouth every 8 (eight) hours as needed for muscle spasms.  . Misc Natural Products (OSTEO BI-FLEX TRIPLE STRENGTH PO) Take 1 tablet by mouth daily. With Vit D  . MISC NATURAL PRODUCTS PO Take 2 capsules by mouth 3 (three) times daily. [employer]  . Multiple Vitamins-Minerals (PRESERVISION AREDS 2+MULTI VIT) CAPS Take 1 capsule by mouth daily.  Marland Kitchen omeprazole (PRILOSEC) 20 MG capsule Take 1 capsule (20 mg total) by mouth daily.  . promethazine-phenylephrine (PROMETHAZINE VC) 6.25-5 MG/5ML SYRP Take 5 mLs by mouth every 4 (four) hours as needed for congestion.  . sertraline (ZOLOFT) 100 MG tablet Take 1 tablet (100 mg total) by mouth daily.  Marland Kitchen spironolactone (ALDACTONE) 25 MG tablet Take 1 tablet by mouth once daily  . TURMERIC PO Take 1 tablet by mouth daily.  Marland Kitchen VITAMIN E PO Take 1 tablet by mouth daily.  . VOLTAREN 1 % GEL APPLY ONE APPLICATION TOPICALLY 3 TIMES DAILY     Allergies:    Amlodipine, Prednisone, Ace inhibitors, Alendronate sodium, Losartan, Other, Paroxetine hcl, Risedronate sodium, Silenor [doxepin hcl], Simvastatin, Sulfonamide derivatives, Toprol xl [metoprolol], Tramadol, and Influenza vaccines   Social History   Socioeconomic History  . Marital status: Married    Spouse name: Not on file  . Number of children: 2  . Years of education: Not on file  . Highest education level: Not on file  Occupational History  . Occupation: Retired    Fish farm manager: RETIRED  Tobacco Use  . Smoking status: Never Smoker  . Smokeless  tobacco: Never Used  Vaping Use  . Vaping Use: Never used  Substance and Sexual Activity  . Alcohol use: No  . Drug use: No  . Sexual activity: Not Currently  Other Topics Concern  . Not on file  Social History Narrative   Left handed   Widow. Husband ([name]) deceased 02/2016 from dementia. She was caregiver.    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  ROS:   Please see the history of present illness.     All other systems reviewed and are negative.  EKGs/Labs/Other Studies Reviewed:    The following studies were reviewed today:   EKG:  EKG is  ordered today.  The ekg ordered today demonstrates sinus rhythm, PACs.  Recent Labs: 03/17/2020: ALT 14; BUN 20; Creatinine, Ser 0.97; Hemoglobin 12.9; Platelets 213; Potassium 4.5; Sodium 132; TSH 0.622  Recent Lipid Panel    Component  Value Date/Time   CHOL 186 03/17/2020 1519   TRIG 137 03/17/2020 1519   HDL 52 03/17/2020 1519   CHOLHDL 3.6 03/17/2020 1519   VLDL 27 03/17/2020 1519   LDLCALC 107 (H) 03/17/2020 1519   LDLDIRECT 86.0 09/19/2014 1049    Physical Exam:    VS:  BP 126/70   Pulse (!) 53   Ht 5' 0.05" (1.525 m)   Wt 162 lb (73.5 kg)   BMI 31.59 kg/m     Wt Readings from Last 3 Encounters:  05/16/20 162 lb (73.5 kg)  04/04/20 162 lb (73.5 kg)  03/28/20 157 lb 6 oz (71.4 kg)     GEN:  Well nourished, well developed in no acute distress HEENT: Normal NECK: No JVD; No carotid bruits LYMPHATICS: No lymphadenopathy CARDIAC: RRR, 3/6 systolic murmur, rubs, gallops RESPIRATORY:  Clear to auscultation without rales, wheezing or rhonchi  ABDOMEN: Soft, non-tender, non-distended MUSCULOSKELETAL: Trace edema; No deformity  SKIN: Warm and dry NEUROLOGIC:  Alert and oriented x 3 PSYCHIATRIC:  Normal affect   ASSESSMENT:    1. Primary hypertension   2. Diastolic dysfunction   3. Aortic valve stenosis, etiology of cardiac valve disease unspecified    PLAN:    In order of problems listed above:  1. Hypertension, BP  controlled.  cont Coreg to 6.25 mg twice daily, continue Aldactone. cont irbesartan 300 mg daily.  BP today controlled at systolic 120s.  Home BP measurements average 140 systolic.  Recommend patient brings BP cuff at follow-up visit due to discrepant findings.  Continue current medications as prescribed. 2. grade 2 diastolic dysfunction.  HTN likely cause.  Currently euvolemic.  Continue Aldactone,  Lasix as needed. 3. Systolic murmur, mild aortic stenosis.  Monitor with echocardiograms serially every 3 to 5 years according to [facility] AHA guidelines.  Last echo 11/2019.  Follow-up in 3 months.   Medication Adjustments/Labs and Tests Ordered: Current medicines are reviewed at length with the patient today.  Concerns regarding medicines are outlined above.  No orders of the defined types  were placed in this encounter.  No orders of the defined types were placed in this encounter.   Patient Instructions  Medication Instructions:  Your physician recommends that you continue on your current medications as directed. Please refer to the Current Medication list given to you today.  *If you need a refill on your cardiac medications before your next appointment, please call your pharmacy*   Lab Work: None Ordered If you have labs (blood work) drawn today and your tests are completely normal, you will receive your results only by: Marland Kitchen MyChart Message (if you have MyChart) OR . A paper copy in the mail If you have any lab test that is abnormal or we need to change your treatment, we will call you to review the results.   Testing/Procedures: None Ordered   Follow-Up: At [facility], you and your health needs are our priority.  As part of our continuing mission to provide you with exceptional heart care, we have created designated Provider Care Teams.  These Care Teams include your primary Cardiologist (physician) and Advanced Practice Providers (APPs -  Physician Assistants and Nurse Practitioners) who all work together to provide you with the care you need, when you need it.  We recommend signing up for the patient portal called "MyChart".  Sign up information is provided on this After Visit Summary.  MyChart is used to connect with patients for Virtual Visits (Telemedicine).  Patients are able to view lab/test results, encounter notes, upcoming appointments, etc.  Non-urgent messages can be sent to your provider as well.   To learn more about what you can do with MyChart, go to [email or web address].    Your next appointment:   3 month(s)  The format for your next appointment:   In Person  Provider:   Kate Sable, MD   Other Instructions      Signed, Kate Sable, MD  05/16/2020 5:10 PM    [place]

## 2020-05-23 ENCOUNTER — Ambulatory Visit: Payer: PPO

## 2020-05-24 ENCOUNTER — Other Ambulatory Visit: Payer: Self-pay | Admitting: Family Medicine

## 2020-05-26 NOTE — Telephone Encounter (Signed)
Pharmacy requests refill on: Clonazepam 0.5 mg   LAST REFILL: 04/21/2020 (Q-30, R-0) LAST OV: 03/28/2020 NEXT OV: 09/29/2020 PHARMACY: [place], [place]

## 2020-05-26 NOTE — Telephone Encounter (Signed)
Erx

## 2020-06-17 ENCOUNTER — Telehealth: Payer: Self-pay

## 2020-06-17 DIAGNOSIS — M810 Age-related osteoporosis without current pathological fracture: Secondary | ICD-10-CM

## 2020-06-17 NOTE — Telephone Encounter (Signed)
Called and spoke with patient regarding Prolia and informed patient that it would be $275 for the injection and administration fee. Patient stated that two months ago her pharmacy called her and informed her that it would be $200. Patient did not want to pay the $275. Informed patient that we would run her pharmacy benefits and inform her once we received clarification. Patient verbalized understanding.

## 2020-06-18 NOTE — Telephone Encounter (Signed)
Spoke with Joycelyn Schmid with [employer] who stated that patient could get Prolia through her pharmacy for approximately $200, but it may put her in the donut hole sooner. Called and informed patient of this information who stated that she did not want to go into the donut hole and she also did not want to pay $275 out of pocket. Patient stated that she may not get the shot this year because of this. Encouraged patient that the medication is important for her. Patient verbalized understanding. Sending to Larene Beach, RN for further assistance.

## 2020-06-21 ENCOUNTER — Other Ambulatory Visit: Payer: Self-pay | Admitting: Family Medicine

## 2020-06-21 NOTE — Telephone Encounter (Signed)
Last prolia inj was 11/08/19; so next inj is due after 05/11/20. Will contact pt to discuss importance of medication.

## 2020-06-23 NOTE — Telephone Encounter (Signed)
ERx 

## 2020-07-03 NOTE — Telephone Encounter (Signed)
LVM on both numbers 

## 2020-07-03 NOTE — Telephone Encounter (Signed)
Advised pt of importance of prolia. Pt said she would like to wait until after her Dexa scan on 08/22/20 and talk to Dr. Darnell Level maybe at her CPE in June. Advised if anything changed to contact the office. Pt verbalized understanding.

## 2020-07-04 NOTE — Telephone Encounter (Signed)
Patient returned your call. EM 

## 2020-07-04 NOTE — Telephone Encounter (Signed)
Lvm asking pt to call back.  Need to relay Dr. G's message.  

## 2020-07-04 NOTE — Telephone Encounter (Signed)
Spoke with pt relaying Dr. Synthia Innocent message.  Pt verbalizes understanding but declines med at this time because of possible blood clot and she already has HTN.

## 2020-07-04 NOTE — Telephone Encounter (Addendum)
She declined prolia. She should be on something for osteoporosis, especially right after stopping prolia due to increased fracture risk - would she be willing to try evista an estrogen like medication? If started we have to watch for increased blood clot risk.

## 2020-07-10 NOTE — Telephone Encounter (Signed)
Noted. Paperwork in prolia book was placed in the long-term medical hold section in case pt would want to restart prolia. Cancelled remind me note.

## 2020-07-17 ENCOUNTER — Telehealth: Payer: Self-pay | Admitting: Gastroenterology

## 2020-07-17 DIAGNOSIS — R1030 Lower abdominal pain, unspecified: Secondary | ICD-10-CM

## 2020-07-17 DIAGNOSIS — R112 Nausea with vomiting, unspecified: Secondary | ICD-10-CM

## 2020-07-17 DIAGNOSIS — K5900 Constipation, unspecified: Secondary | ICD-10-CM

## 2020-07-17 NOTE — Telephone Encounter (Signed)
Patient states she has not taken the magnesium citrate. She states she is waiting on her daughter to come over to take it when she is there. She states that she is feeling some better but will go to the ER if the pain gets worse.

## 2020-07-17 NOTE — Telephone Encounter (Signed)
There is no DPR sign for the patient for me to talk to the daughter. Called patient and she states she is having lower abdominal pain all the way across her stomach. She states that she has had bloating. Denies any current rectal bleeding. She has had constipation states it hard to have a bowel movement and they small pellets. She states she has had a lot of nausea and vomited last night. She states she thinks it is from the pain

## 2020-07-17 NOTE — Telephone Encounter (Signed)
Patient's daughter called to report her Mom had been up most of the night having Diverticulitis episode with abdominal pain and difficulty having a bowel movement (small pellets per daughter)  Daughter asked if Dr Marius Ditch would call in an antibiotic.  Patient has appointment on Monday 07/21/20 with Dr Marius Ditch. Please call daughter, [name].

## 2020-07-17 NOTE — Telephone Encounter (Signed)
I can see her today, if she wants to come in  Otherwise, CBC and ask her to try miralax 17gm BID If pain is worsening she has to go to ER  RV

## 2020-07-17 NOTE — Addendum Note (Signed)
Addended by: Ulyess Blossom L on: 07/17/2020 10:16 AM   Modules accepted: Orders

## 2020-07-17 NOTE — Telephone Encounter (Signed)
We do not have any rooms to add anyone to scheduled today because there are 3 providers in the clinic today and no rooms. Informed Dr. Marius Ditch of this information and she said for patient to get a bottle of magnesium citrate and drink that this morning and then call us back this afternoon to see how she is feeling. If pain gets worse she needs to go to the ER. Informed patient that she needed to drink a magnesium citrate she said she did not know if she could because she is so weak. Informed patient she needed to because she is constipated and needed to break the stool up. She states she will try. Informed patient she needed to get a CBC done today and If the pain got worse to go to the ER. She verbalized understanding and states the pain has improved some from last night

## 2020-07-18 ENCOUNTER — Other Ambulatory Visit: Payer: Self-pay | Admitting: Family Medicine

## 2020-07-18 NOTE — Telephone Encounter (Signed)
Name of Medication: Clonazepam Name of Pharmacy: [facility] or Written Date and Quantity: 06/23/20, #30 Last Office Visit and Type: 03/28/20, AWV Next Office Visit and Type: 09/29/20, 6 mo f/u Last Controlled Substance Agreement Date: 09/26/14 Last UDS: 09/26/14

## 2020-07-21 ENCOUNTER — Other Ambulatory Visit: Payer: Self-pay

## 2020-07-21 ENCOUNTER — Ambulatory Visit: Payer: PPO | Admitting: Gastroenterology

## 2020-07-21 ENCOUNTER — Encounter: Payer: Self-pay | Admitting: Gastroenterology

## 2020-07-21 VITALS — BP 102/64 | HR 58 | Temp 97.8°F | Ht 60.5 in | Wt 160.0 lb

## 2020-07-21 DIAGNOSIS — K625 Hemorrhage of anus and rectum: Secondary | ICD-10-CM

## 2020-07-21 DIAGNOSIS — K5730 Diverticulosis of large intestine without perforation or abscess without bleeding: Secondary | ICD-10-CM | POA: Diagnosis not present

## 2020-07-21 DIAGNOSIS — R140 Abdominal distension (gaseous): Secondary | ICD-10-CM

## 2020-07-21 DIAGNOSIS — K641 Second degree hemorrhoids: Secondary | ICD-10-CM

## 2020-07-21 DIAGNOSIS — K5904 Chronic idiopathic constipation: Secondary | ICD-10-CM | POA: Diagnosis not present

## 2020-07-21 DIAGNOSIS — R1032 Left lower quadrant pain: Secondary | ICD-10-CM

## 2020-07-21 MED ORDER — METRONIDAZOLE 500 MG PO TABS: 500.0000 mg | ORAL_TABLET | Freq: Two times a day (BID) | ORAL | 0 refills | Status: AC

## 2020-07-21 MED ORDER — TRULANCE 3 MG PO TABS: 1.0000 | ORAL_TABLET | Freq: Every day | ORAL | 0 refills | Status: DC

## 2020-07-21 NOTE — Progress Notes (Signed)
Cephas Darby, MD [address]  [place]  [place], [place] [address]  Main: [phone]  Fax: [phone]    Gastroenterology Consultation  Referring Provider:     [name], MD Primary Care Physician:  [name], MD Primary Gastroenterologist:  Dr. Cephas Darby Reason for Consultation:     Left lower quadrant pain, abdominal bloating and rectal bleeding        HPI:   Brooke Beasley is a 87 y.o. female referred by Dr. [name], MD  for consultation & management of chronic intermittent left lower quadrant pain, abdominal bloating and rectal bleeding.  Patient is well-known to me for more than a year with history of recurrent C. difficile diarrhea s/p Zinplava.  She called me last week secondary to severe left lower quadrant pain associated with hard lumpy bowel movements as well as intermittent rectal bleeding.  He reported that she was severely constipated.  I have advised her to try magnesium citrate which she did and started having bowel movements, all associated with incomplete emptying.  She is also experiencing bright red blood per rectum, reports drops of blood dripping into the toilet bowel.  She does report severe lower abdominal bloating that sometimes it becomes harder towards end of the day causing discomfort.  Patient is worried about rectal bleeding than anything today  She underwent colonoscopy in May 21 which revealed sigmoid colon diverticulosis, small subcentimeter polyps which are benign as well as large internal/external hemorrhoids.  She is also evaluated by colorectal surgery for rectal prolapse, decision was made to hold off on any surgical intervention at this time  NSAIDs: None  Antiplts/Anticoagulants/Anti thrombotics: None  GI Procedures:  Colonoscopy 09/10/2019 - Hemorrhoids found on perianal exam. - Diverticulosis in the sigmoid colon. - Two 5 to 6 mm polyps in the ascending colon and in the cecum, removed with a  cold snare. Resected and retrieved. - Rectal prolapse. - Non-bleeding external hemorrhoids.  Past Medical History:  Diagnosis Date  . Anxiety   . CAP (community acquired pneumonia) 12/22/2017  . Carotid stenosis 10/18/2014   R 1-39%, L 40-59%, rpt 1 yr (09/2014)   . CKD (chronic kidney disease) stage 3, GFR 30-59 ml/min (HCC) 08/02/2014  . Clostridioides difficile infection    hx 2020  . Degenerative disc disease    LS  . Depression    nervous breakdonw in 1964-out of work for a year  . Diverticulosis    severe by colonoscopy  . Family history of adverse reaction to anesthesia    n/v  . GERD (gastroesophageal reflux disease)   . Glaucoma   . Heart murmur 5/13   mitral regurge - on echo   . History of hiatal hernia   . History of shingles   . HTN (hypertension)   . Hyperlipidemia   . Hypertension   . Hypothyroidism   . IBS 11/02/2006  . Intestinal bacterial overgrowth    In small colon  . Maxillary fracture ([place]) 04/08/2012  . Osteoarthritis 2016   Jefm Bryant)  . Osteoporosis    dexa 2011  . Pseudogout 2016   shoulders ([place])  . Pseudogout     Past Surgical History:  Procedure Laterality Date  . barium enema  2012   severe diverticulosis, redundant colon  . Bowel obstruction  1999   no surgery in hosp x 3 days  . COLONOSCOPY  1. 1999  2. 11/04   1. Not finished  2. Slight hemorrhage rectosigmoid area, severe sig  diverticulosis  . COLONOSCOPY WITH PROPOFOL N/A 09/10/2019   SSP with dysplasia, TA (Crissy Mccreadie, Tally Due, MD)  . Dexa  1. 1829-9371   2. 9/04  3. 3/08    1. OP  2. OP, borderline, spine -2.44T  3. decreased BMD-OP  . DEXA  2011   OP in spine  . DG KNEE 1-2 VIEWS BILAT     LS x-ray with degenerative disc and facet change  . ESOPHAGOGASTRODUODENOSCOPY     Negative  . Hemorrhoid procedure  4/08  . JOINT REPLACEMENT    . TONSILLECTOMY    . TOTAL SHOULDER ARTHROPLASTY Left 11/27/2015   Corky Mull, MD  . TOTAL SHOULDER REVISION Left 03/16/2016    Procedure: TOTAL SHOULDER REVISION;  Surgeon: Corky Mull, MD;  Location: [facility] ORS;  Service: Orthopedics;  Laterality: Left;  . US ECHOCARDIOGRAPHY  09/9676   Normal systolic fxn with EF 93-81%.  Focal basal septal hypertrophy.  Mild diastolic dysfunction.  Mild MR.    Current Outpatient Medications:  .  acetaminophen (TYLENOL) 500 MG tablet, Take 500-1,000 mg by mouth at bedtime. Depends on pain, Disp: , Rfl:  .  albuterol (PROVENTIL HFA;VENTOLIN HFA) 108 (90 Base) MCG/ACT inhaler, Inhale 2 puffs into the lungs every 6 (six) hours as needed for wheezing or shortness of breath., Disp: 1 Inhaler, Rfl: 0 .  bimatoprost (LUMIGAN) 0.01 % SOLN, Place 1 drop into both eyes at bedtime., Disp: , Rfl:  .  Bioflavonoid Products (VITAMIN C) CHEW, Chew 1 tablet by mouth daily., Disp: , Rfl:  .  carvedilol (COREG) 6.25 MG tablet, Take 1 tablet (6.25 mg total) by mouth 2 (two) times daily., Disp: 60 tablet, Rfl: 5 .  cholecalciferol (VITAMIN D) 1000 units tablet, Take 1,000 Units by mouth daily., Disp: , Rfl:  .  clonazePAM (KLONOPIN) 0.5 MG tablet, TAKE 1 TABLET BY MOUTH AT BEDTIME, Disp: 30 tablet, Rfl: 0 .  colchicine 0.6 MG tablet, Take 1 tablet (0.6 mg total) by mouth daily as needed (gout flare)., Disp: 90 tablet, Rfl: 0 .  denosumab (PROLIA) 60 MG/ML SOSY injection, Inject 60 mg into the skin every 6 (six) months., Disp: 1 mL, Rfl: 1 .  EUTHYROX 100 MCG tablet, Take 1 tablet by mouth once daily, Disp: 90 tablet, Rfl: 0 .  fish oil-omega-3 fatty acids 1000 MG capsule, Take 1 g by mouth daily. , Disp: , Rfl:  .  furosemide (LASIX) 20 MG tablet, Take 1 tablet (20 mg total) by mouth as needed., Disp: 30 tablet, Rfl: 5 .  HYDROcodone-acetaminophen (NORCO/VICODIN) 5-325 MG tablet, Take 1-2 tablets by mouth every 6 (six) hours as needed., Disp: 6 tablet, Rfl: 0 .  hydrocortisone (ANUSOL-HC) 2.5 % rectal cream, Place 1 application rectally 2 (two) times daily., Disp: 30 g, Rfl: 0 .  hydrocortisone 2.5 % cream,  Place rectally., Disp: , Rfl:  .  irbesartan (AVAPRO) 300 MG tablet, Take 1 tablet (300 mg total) by mouth daily., Disp: 30 tablet, Rfl: 5 .  methocarbamol (ROBAXIN) 500 MG tablet, Take 1 tablet (500 mg total) by mouth every 8 (eight) hours as needed for muscle spasms., Disp: 8 tablet, Rfl: 0 .  metroNIDAZOLE (FLAGYL) 500 MG tablet, Take 1 tablet (500 mg total) by mouth 2 (two) times daily for 14 days., Disp: 28 tablet, Rfl: 0 .  Misc Natural Products (OSTEO BI-FLEX TRIPLE STRENGTH PO), Take 1 tablet by mouth daily. With Vit D, Disp: , Rfl:  .  MISC NATURAL PRODUCTS PO, Take 2  capsules by mouth 3 (three) times daily. [place], Disp: , Rfl:  .  Multiple Vitamins-Minerals (PRESERVISION AREDS 2+MULTI VIT) CAPS, Take 1 capsule by mouth daily., Disp: , Rfl:  .  omeprazole (PRILOSEC) 20 MG capsule, Take 1 capsule (20 mg total) by mouth daily., Disp: 90 capsule, Rfl: 3 .  Plecanatide (TRULANCE) 3 MG TABS, Take 1 tablet by mouth daily., Disp: 30 tablet, Rfl: 0 .  promethazine-phenylephrine (PROMETHAZINE VC) 6.25-5 MG/5ML SYRP, Take 5 mLs by mouth every 4 (four) hours as needed for congestion., Disp: 118 mL, Rfl: 0 .  psyllium (METAMUCIL) 58.6 % packet, Take by mouth., Disp: , Rfl:  .  sertraline (ZOLOFT) 100 MG tablet, Take 1 tablet (100 mg total) by mouth daily., Disp: 90 tablet, Rfl: 3 .  Simethicone 180 MG CAPS, Take by mouth., Disp: , Rfl:  .  spironolactone (ALDACTONE) 25 MG tablet, Take 1 tablet by mouth once daily, Disp: 30 tablet, Rfl: 5 .  TURMERIC PO, Take 1 tablet by mouth daily., Disp: , Rfl:  .  VITAMIN E PO, Take 1 tablet by mouth daily., Disp: , Rfl:  .  VOLTAREN 1 % GEL, APPLY ONE APPLICATION TOPICALLY 3 TIMES DAILY, Disp: 100 g, Rfl: 3 .  amLODipine (NORVASC) 10 MG tablet, Take by mouth. (Patient not taking: Reported on 07/21/2020), Disp: , Rfl:    Family History  Problem Relation Age of Onset  . Diabetes Brother        post-op  . Coronary artery disease Brother   . Diabetes  Paternal Grandfather   . Breast cancer Cousin      Social History   Tobacco Use  . Smoking status: Never Smoker  . Smokeless tobacco: Never Used  Vaping Use  . Vaping Use: Never used  Substance Use Topics  . Alcohol use: No  . Drug use: No    Allergies as of 07/21/2020 - Review Complete 07/21/2020  Allergen Reaction Noted  . Amlodipine Other (See Comments) 05/16/2020  . Prednisone Other (See Comments) 11/12/2015  . Ace inhibitors Other (See Comments) 04/02/2008  . Alendronate sodium  09/14/2006  . Losartan Other (See Comments) 09/26/2014  . Other Other (See Comments) 11/12/2015  . Paroxetine hcl Other (See Comments) 06/15/2011  . Risedronate sodium  03/13/2007  . Silenor [doxepin hcl] Other (See Comments) 04/02/2013  . Simvastatin  10/03/2009  . Sulfonamide derivatives  06/08/2006  . Toprol xl [metoprolol] Diarrhea 08/03/2019  . Tramadol Other (See Comments) 11/12/2015  . Influenza vaccines Rash and Other (See Comments) 05/24/2014    Review of Systems:    All systems reviewed and negative except where noted in HPI.   Physical Exam:  BP 102/64 (BP Location: Left Arm, Patient Position: Sitting, Cuff Size: Normal)   Pulse (!) 58   Temp 97.8 F (36.6 C) (Oral)   Ht 5' 0.5" (1.537 m)   Wt 160 lb (72.6 kg)   BMI 30.73 kg/m  No LMP recorded. Patient is postmenopausal.  General:   Alert,  Well-developed, well-nourished, pleasant and cooperative in NAD Head:  Normocephalic and atraumatic. Eyes:  Sclera clear, no icterus.   Conjunctiva pink. Ears:  Normal auditory acuity. Nose:  No deformity, discharge, or lesions. Mouth:  No deformity or lesions,oropharynx pink & moist. Neck:  Supple; no masses or thyromegaly. Lungs:  Respirations even and unlabored.  Clear throughout to auscultation.   No wheezes, crackles, or rhonchi. No acute distress. Heart:  Regular rate and rhythm; no murmurs, clicks, rubs, or gallops. Abdomen:  Normal  bowel sounds. Soft, non-tender and  non-distended without masses, hepatosplenomegaly or hernias noted.  No guarding or rebound tenderness.   Rectal: Not performed Msk:  Symmetrical without gross deformities. Good, equal movement & strength bilaterally. Pulses:  Normal pulses noted. Extremities:  No clubbing or edema.  No cyanosis. Neurologic:  Alert and oriented x3;  grossly normal neurologically. Skin:  Intact without significant lesions or rashes. No jaundice. Lymph Nodes:  No significant cervical adenopathy. Psych:  Alert and cooperative. Normal mood and affect.  Imaging Studies: Reviewed  Assessment and Plan:   Brooke Beasley is a 87 y.o. female with history of chronic constipation, sigmoid diverticulosis, chronic abdominal bloating, antibiotic associated C. difficile diarrhea in 2/2020s/p 10days of oral vancomycin, 1st recurrence in 06/2018,treated with 2 weeks of Dificidwith prolonged taper. Second recurrence in 09/2018, treated with long-termoral vancomycin.C. difficile toxin returned positive on 11/14/2018 while on oral vancomycin, s/p Zinplava in 04/2019 with resolution of diarrhea. Patient is not on any antibiotics at this time. Patient is now experiencing constipation associated with mucus discharge per rectum and overflow diarrhea, lower abdominal discomfort and abdominal bloating, bright red blood per rectum.  Left lower quadrant pain with abdominal bloating secondary to chronic constipation Empiric trial of Flagyl 500 mg twice daily for 10 days  Chronic constipation Increase MiraLAX to 2 times daily along with fiber supplements Trulance is expensive, patient could not afford it Patient did not tolerate Linzess in the past, resulted in nausea and abdominal discomfort  Rectal bleeding secondary to hemorrhoids I have discussed regarding hemorrhoid ligation including the procedure, risks and benefits Consent obtained, perform hemorrhoid ligation today Recheck CBC, iron panel, B12 and folate  levels   Follow up in 2 weeks   Cephas Darby, MD

## 2020-07-21 NOTE — Progress Notes (Signed)
PROCEDURE NOTE: The patient presents with symptomatic grade 1 hemorrhoids, unresponsive to maximal medical therapy, requesting rubber band ligation of his/her hemorrhoidal disease.  All risks, benefits and alternative forms of therapy were described and informed consent was obtained.  In the Left Lateral Decubitus position (if anoscopy is performed) anoscopic examination revealed grade 1 hemorrhoids in the all position(s).   The decision was made to band the RP internal hemorrhoid, and the [facility] O'Regan System was used to perform band ligation without complication.  Digital anorectal examination was then performed to assure proper positioning of the band, and to adjust the banded tissue as required.  The patient was discharged home without pain or other issues.  Dietary and behavioral recommendations were given and (if necessary - prescriptions were given), along with follow-up instructions.  The patient will return 2 weeks for follow-up and possible additional banding as required.  No complications were encountered and the patient tolerated the procedure well.    

## 2020-07-22 NOTE — Telephone Encounter (Signed)
ERx 

## 2020-08-02 ENCOUNTER — Other Ambulatory Visit: Payer: Self-pay | Admitting: Family Medicine

## 2020-08-11 ENCOUNTER — Ambulatory Visit: Payer: PPO | Admitting: Gastroenterology

## 2020-08-11 ENCOUNTER — Other Ambulatory Visit: Payer: Self-pay

## 2020-08-11 ENCOUNTER — Encounter: Payer: Self-pay | Admitting: Gastroenterology

## 2020-08-11 VITALS — BP 132/76 | HR 71 | Temp 97.7°F | Ht 60.5 in | Wt 160.4 lb

## 2020-08-11 DIAGNOSIS — K625 Hemorrhage of anus and rectum: Secondary | ICD-10-CM | POA: Diagnosis not present

## 2020-08-11 DIAGNOSIS — K641 Second degree hemorrhoids: Secondary | ICD-10-CM | POA: Diagnosis not present

## 2020-08-11 NOTE — Progress Notes (Signed)
PROCEDURE NOTE: The patient presents with symptomatic grade 1 hemorrhoids, unresponsive to maximal medical therapy, requesting rubber band ligation of his/her hemorrhoidal disease.  All risks, benefits and alternative forms of therapy were described and informed consent was obtained.   The decision was made to band the RA internal hemorrhoid, and the [facility] O'Regan System was used to perform band ligation without complication.  Digital anorectal examination was then performed to assure proper positioning of the band, and to adjust the banded tissue as required.  The patient was discharged home without pain or other issues.  Dietary and behavioral recommendations were given and (if necessary - prescriptions were given), along with follow-up instructions.  The patient will return 2 weeks for follow-up and possible additional banding as required.  No complications were encountered and the patient tolerated the procedure well.    

## 2020-08-15 ENCOUNTER — Encounter: Payer: Self-pay | Admitting: Cardiology

## 2020-08-15 ENCOUNTER — Ambulatory Visit (INDEPENDENT_AMBULATORY_CARE_PROVIDER_SITE_OTHER): Payer: PPO | Admitting: Cardiology

## 2020-08-15 ENCOUNTER — Other Ambulatory Visit: Payer: Self-pay

## 2020-08-15 VITALS — BP 140/72 | HR 57 | Ht 60.5 in | Wt 160.0 lb

## 2020-08-15 DIAGNOSIS — I10 Essential (primary) hypertension: Secondary | ICD-10-CM | POA: Diagnosis not present

## 2020-08-15 DIAGNOSIS — I350 Nonrheumatic aortic (valve) stenosis: Secondary | ICD-10-CM

## 2020-08-15 DIAGNOSIS — I5189 Other ill-defined heart diseases: Secondary | ICD-10-CM | POA: Diagnosis not present

## 2020-08-15 NOTE — Patient Instructions (Signed)
Medication Instructions:   1.  Take your Coreg 6.25 MG ONCE A DAY in the evening. Then take it [place] for one week, then stop.   *If you need a refill on your cardiac medications before your next appointment, please call your pharmacy*   Lab Work: None ordered If you have labs (blood work) drawn today and your tests are completely normal, you will receive your results only by: Marland Kitchen MyChart Message (if you have MyChart) OR . A paper copy in the mail If you have any lab test that is abnormal or we need to change your treatment, we will call you to review the results.   Testing/Procedures: None ordered   Follow-Up: At [facility], you and your health needs are our priority.  As part of our continuing mission to provide you with exceptional heart care, we have created designated Provider Care Teams.  These Care Teams include your primary Cardiologist (physician) and Advanced Practice Providers (APPs -  Physician Assistants and Nurse Practitioners) who all work together to provide you with the care you need, when you need it.  We recommend signing up for the patient portal called "MyChart".  Sign up information is provided on this After Visit Summary.  MyChart is used to connect with patients for Virtual Visits (Telemedicine).  Patients are able to view lab/test results, encounter notes, upcoming appointments, etc.  Non-urgent messages can be sent to your provider as well.   To learn more about what you can do with MyChart, go to [email or web address].    Your next appointment:   2 month(s)  The format for your next appointment:   In Person  Provider:   Kate Sable, MD   Other Instructions

## 2020-08-15 NOTE — Progress Notes (Signed)
Cardiology Office Note:    Date:  08/15/2020   ID:  Brooke Beasley, DOB Oct 13, 1930, MRN [id number]  PCP:  [name], MD  [place] Cardiologist:  No primary care provider on file.  [facility] Electrophysiologist:  None   Referring MD: [name], MD   Chief Complaint  Patient presents with  . Follow-up    3 months  See patient message from today---attached BP readings    History of Present Illness:    Brooke Beasley is a 87 y.o. female with a hx of anxiety, hypertension, hyperlipidemia, who presents for follow-up.  Patient last seen due to elevated blood pressures.    Patient states having profound fatigue with exertion.  Edema.  Blood pressures range anywhere from 118 to occasionally 557D systolic.  She is worried it may be her heart.  Endorsed taking all medications as prescribed.  Prior notes Echocardiogram on 11/26/2019 showed normal systolic function, EF 60 to 22%, grade 2 diastolic dysfunction.  Mild to moderate MR, mild MS.  Patient previously took lisinopril, but stopped due to side effects/cough.  Amlodipine stopped due to abdominal pains  Past Medical History:  Diagnosis Date  . Anxiety   . CAP (community acquired pneumonia) 12/22/2017  . Carotid stenosis 10/18/2014   R 1-39%, L 40-59%, rpt 1 yr (09/2014)   . CKD (chronic kidney disease) stage 3, GFR 30-59 ml/min (HCC) 08/02/2014  . Clostridioides difficile infection    hx 2020  . Degenerative disc disease    LS  . Depression    nervous breakdonw in 1964-out of work for a year  . Diverticulosis    severe by colonoscopy  . Family history of adverse reaction to anesthesia    n/v  . GERD (gastroesophageal reflux disease)   . Glaucoma   . Heart murmur 5/13   mitral regurge - on echo   . History of hiatal hernia   . History of shingles   . HTN (hypertension)   . Hyperlipidemia   . Hypertension   . Hypothyroidism   . IBS 11/02/2006  . Intestinal bacterial overgrowth    In  small colon  . Maxillary fracture ([place]) 04/08/2012  . Osteoarthritis 2016   Jefm Bryant)  . Osteoporosis    dexa 2011  . Pseudogout 2016   shoulders ([place])  . Pseudogout     Past Surgical History:  Procedure Laterality Date  . barium enema  2012   severe diverticulosis, redundant colon  . Bowel obstruction  1999   no surgery in hosp x 3 days  . COLONOSCOPY  1. 1999  2. 11/04   1. Not finished  2. Slight hemorrhage rectosigmoid area, severe sig diverticulosis  . COLONOSCOPY WITH PROPOFOL N/A 09/10/2019   SSP with dysplasia, TA (Vanga, Tally Due, MD)  . Dexa  1. 0254-2706   2. 9/04  3. 3/08    1. OP  2. OP, borderline, spine -2.44T  3. decreased BMD-OP  . DEXA  2011   OP in spine  . DG KNEE 1-2 VIEWS BILAT     LS x-ray with degenerative disc and facet change  . ESOPHAGOGASTRODUODENOSCOPY     Negative  . Hemorrhoid procedure  4/08  . JOINT REPLACEMENT    . TONSILLECTOMY    . TOTAL SHOULDER ARTHROPLASTY Left 11/27/2015   Corky Mull, MD  . TOTAL SHOULDER REVISION Left 03/16/2016   Procedure: TOTAL SHOULDER REVISION;  Surgeon: Corky Mull, MD;  Location: [facility] ORS;  Service: Orthopedics;  Laterality:  Left;  . US ECHOCARDIOGRAPHY  [id number]   Normal systolic fxn with EF 0000000.  Focal basal septal hypertrophy.  Mild diastolic dysfunction.  Mild MR.    Current Medications: Current Meds  Medication Sig  . acetaminophen (TYLENOL) 500 MG tablet Take 500-1,000 mg by mouth at bedtime. Depends on pain  . albuterol (PROVENTIL HFA;VENTOLIN HFA) 108 (90 Base) MCG/ACT inhaler Inhale 2 puffs into the lungs every 6 (six) hours as needed for wheezing or shortness of breath.  . bimatoprost (LUMIGAN) 0.01 % SOLN Place 1 drop into both eyes at bedtime.  Marland Kitchen Bioflavonoid Products (VITAMIN C) CHEW Chew 1 tablet by mouth daily.  . cholecalciferol (VITAMIN D) 1000 units tablet Take 1,000 Units by mouth daily.  . clonazePAM (KLONOPIN) 0.5 MG tablet TAKE 1 TABLET BY MOUTH AT BEDTIME  . colchicine 0.6  MG tablet Take 1 tablet (0.6 mg total) by mouth daily as needed (gout flare).  Marland Kitchen denosumab (PROLIA) 60 MG/ML SOSY injection Inject 60 mg into the skin every 6 (six) months.  . EUTHYROX 100 MCG tablet Take 1 tablet by mouth once daily  . fish oil-omega-3 fatty acids 1000 MG capsule Take 1 g by mouth daily.   . furosemide (LASIX) 20 MG tablet Take 1 tablet (20 mg total) by mouth as needed.  Marland Kitchen HYDROcodone-acetaminophen (NORCO/VICODIN) 5-325 MG tablet Take 1-2 tablets by mouth every 6 (six) hours as needed.  . hydrocortisone (ANUSOL-HC) 2.5 % rectal cream Place 1 application rectally 2 (two) times daily.  . hydrocortisone 2.5 % cream Place rectally.  . irbesartan (AVAPRO) 300 MG tablet Take 1 tablet (300 mg total) by mouth daily.  . methocarbamol (ROBAXIN) 500 MG tablet Take 1 tablet (500 mg total) by mouth every 8 (eight) hours as needed for muscle spasms.  . Misc Natural Products (OSTEO BI-FLEX TRIPLE STRENGTH PO) Take 1 tablet by mouth daily. With Vit D  . MISC NATURAL PRODUCTS PO Take 2 capsules by mouth 3 (three) times daily. [employer]  . Multiple Vitamins-Minerals (PRESERVISION AREDS 2+MULTI VIT) CAPS Take 1 capsule by mouth daily.  Marland Kitchen omeprazole (PRILOSEC) 20 MG capsule Take 1 capsule (20 mg total) by mouth daily.  Marland Kitchen Plecanatide (TRULANCE) 3 MG TABS Take 1 tablet by mouth daily.  . promethazine-phenylephrine (PROMETHAZINE VC) 6.25-5 MG/5ML SYRP Take 5 mLs by mouth every 4 (four) hours as needed for congestion.  . psyllium (METAMUCIL) 58.6 % packet Take by mouth.  . sertraline (ZOLOFT) 100 MG tablet Take 1 tablet (100 mg total) by mouth daily.  . Simethicone 180 MG CAPS Take by mouth.  . spironolactone (ALDACTONE) 25 MG tablet Take 1 tablet by mouth once daily  . TURMERIC PO Take 1 tablet by mouth daily.  Marland Kitchen VITAMIN E PO Take 1 tablet by mouth daily.  . VOLTAREN 1 % GEL APPLY ONE APPLICATION TOPICALLY 3 TIMES DAILY  . [DISCONTINUED] carvedilol (COREG) 6.25 MG tablet Take 1 tablet (6.25 mg  total) by mouth 2 (two) times daily.     Allergies:   Amlodipine, Prednisone, Ace inhibitors, Alendronate sodium, Losartan, Other, Paroxetine hcl, Risedronate sodium, Silenor [doxepin hcl], Simvastatin, Sulfonamide derivatives, Toprol xl [metoprolol], Tramadol, and Influenza vaccines   Social History   Socioeconomic History  . Marital status: Married    Spouse name: Not on file  . Number of children: 2  . Years of education: Not on file  . Highest education level: Not on file  Occupational History  . Occupation: Retired    Fish farm manager: RETIRED  Tobacco Use  .  Smoking status: Never Smoker  . Smokeless tobacco: Never Used  Vaping Use  . Vaping Use: Never used  Substance and Sexual Activity  . Alcohol use: No  . Drug use: No  . Sexual activity: Not Currently  Other Topics Concern  . Not on file  Social History Narrative   Left handed   Widow. Husband ([name]) deceased 03/17/16 from dementia. She was caregiver.    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  ROS:   Please see the history of present illness.     All other systems reviewed and are negative.  EKGs/Labs/Other Studies Reviewed:    The following studies were reviewed today:   EKG:  EKG is  ordered today.  The ekg ordered today demonstrates sinus rhythm, PACs.  Recent Labs: 03/17/2020: ALT 14; BUN 20; Creatinine, Ser 0.97; Hemoglobin 12.9; Platelets 213; Potassium 4.5;  Sodium 132; TSH 0.622  Recent Lipid Panel    Component Value Date/Time   CHOL 186 03/17/2020 1519   TRIG 137 03/17/2020 1519   HDL 52 03/17/2020 1519   CHOLHDL 3.6 03/17/2020 1519   VLDL 27 03/17/2020 1519   LDLCALC 107 (H) 03/17/2020 1519   LDLDIRECT 86.0 09/19/2014 1049    Physical Exam:    VS:  BP 140/72   Pulse (!) 57   Ht 5' 0.5" (1.537 m)   Wt 160 lb (72.6 kg)   BMI 30.73 kg/m     Wt Readings from Last 3 Encounters:  08/15/20 160 lb (72.6 kg)  08/11/20 160 lb 6 oz (72.7 kg)  07/21/20 160 lb (72.6 kg)     GEN:  Well nourished, well developed in no acute distress HEENT: Normal NECK: No JVD; No carotid bruits LYMPHATICS: No lymphadenopathy CARDIAC: RRR, 3/6 systolic murmur, rubs, gallops RESPIRATORY:  Clear to auscultation without rales, wheezing or rhonchi  ABDOMEN: Soft, non-tender, non-distended MUSCULOSKELETAL: Trace edema; No deformity  SKIN: Warm and dry NEUROLOGIC:  Alert and oriented x 3 PSYCHIATRIC:  Normal affect   ASSESSMENT:    1. Primary hypertension   2. Diastolic dysfunction   3. Aortic valve stenosis, etiology of cardiac valve disease unspecified    PLAN:    In order of problems listed above:  1. Hypertension, BP reasonably controlled.  Patient endorsed symptoms of fatigue, heart rate 57 today.  Beta-blocker could be contributing to her symptoms.  We will titrate off Coreg over the next 2 weeks and stop.  continue Aldactone. cont irbesartan 300 mg daily.  If blood pressure is significantly elevated over systolics of 353G to 992E, consider HCTZ.  I think for patient's age, it is reasonable to let BP run a little higher. 2. Grade 2 diastolic dysfunction. Likely caused by hypertension.  Currently euvolemic.  Continue Aldactone.  Lasix as needed for edema 3. mild aortic stenosis noted on echo 11/2019.  Plan Monitor with echocardiograms serially every 3 to 5 years according to [facility] AHA guidelines.  Follow-up in 2 months.   Medication  Adjustments/Labs and Tests Ordered: Current medicines are reviewed at length with the patient today.  Concerns regarding medicines are outlined above.  Orders Placed This Encounter  Procedures  . EKG 12-Lead   No orders of the defined types were placed in this encounter.   Patient Instructions  Medication Instructions:   1.  Take your Coreg 6.25 MG ONCE A DAY in the evening. Then take it [place] for one week, then stop.   *If you need a refill on your cardiac medications before your next appointment, please call your pharmacy*   Lab Work: None ordered If you have labs (blood work) drawn today and your tests are completely normal, you will receive your results only by: Marland Kitchen MyChart Message (if you have MyChart) OR . A paper copy in the mail If you have any lab test that is abnormal or we need to change your treatment, we will call you to review the results.   Testing/Procedures: None ordered   Follow-Up: At [facility], you and your health needs are our priority.  As part of our continuing mission to provide you with exceptional heart care, we have created designated Provider Care Teams.  These Care Teams include your primary Cardiologist (physician) and Advanced Practice Providers (APPs -  Physician Assistants and Nurse Practitioners) who all work together to provide you with the care you need, when you need it.  We recommend signing up for the patient portal called "MyChart".  Sign up information is provided on this After Visit Summary.  MyChart is used to connect with patients for Virtual Visits (Telemedicine).  Patients are able to view lab/test results, encounter notes, upcoming appointments, etc.  Non-urgent messages can be sent to your provider as well.   To learn more about what you can do with MyChart, go to [email or web address].    Your next appointment:   2 month(s)  The format for your next appointment:   In Person  Provider:   Kate Sable,  MD   Other Instructions      Signed, Kate Sable, MD  08/15/2020 5:03 PM    [place]

## 2020-08-16 ENCOUNTER — Other Ambulatory Visit: Payer: Self-pay | Admitting: Family Medicine

## 2020-08-18 NOTE — Telephone Encounter (Signed)
Refill request Clonazepam Last refill 07/22/20 #30 Last office visit 03/28/20 Upcoming appointment 09/29/20

## 2020-08-19 NOTE — Telephone Encounter (Signed)
ERx 

## 2020-08-22 ENCOUNTER — Other Ambulatory Visit: Payer: Self-pay

## 2020-08-22 ENCOUNTER — Ambulatory Visit
Admission: RE | Admit: 2020-08-22 | Discharge: 2020-08-22 | Disposition: A | Discharge status: Home / Self Care | Payer: PPO | Source: Ambulatory Visit | Attending: Family Medicine | Admitting: Family Medicine

## 2020-08-22 DIAGNOSIS — M810 Age-related osteoporosis without current pathological fracture: Secondary | ICD-10-CM | POA: Diagnosis not present

## 2020-08-22 DIAGNOSIS — Z1231 Encounter for screening mammogram for malignant neoplasm of breast: Secondary | ICD-10-CM | POA: Diagnosis not present

## 2020-08-22 DIAGNOSIS — Z0000 Encounter for general adult medical examination without abnormal findings: Secondary | ICD-10-CM

## 2020-08-22 DIAGNOSIS — M85851 Other specified disorders of bone density and structure, right thigh: Secondary | ICD-10-CM | POA: Diagnosis not present

## 2020-08-22 DIAGNOSIS — Z780 Asymptomatic menopausal state: Secondary | ICD-10-CM | POA: Diagnosis not present

## 2020-08-29 DIAGNOSIS — H401131 Primary open-angle glaucoma, bilateral, mild stage: Secondary | ICD-10-CM | POA: Diagnosis not present

## 2020-09-07 DIAGNOSIS — R059 Cough, unspecified: Secondary | ICD-10-CM | POA: Diagnosis not present

## 2020-09-07 DIAGNOSIS — J40 Bronchitis, not specified as acute or chronic: Secondary | ICD-10-CM | POA: Diagnosis not present

## 2020-09-07 DIAGNOSIS — R6889 Other general symptoms and signs: Secondary | ICD-10-CM | POA: Diagnosis not present

## 2020-09-15 ENCOUNTER — Encounter: Payer: Self-pay | Admitting: Gastroenterology

## 2020-09-15 ENCOUNTER — Other Ambulatory Visit: Payer: Self-pay

## 2020-09-15 ENCOUNTER — Ambulatory Visit: Payer: PPO | Admitting: Gastroenterology

## 2020-09-15 ENCOUNTER — Telehealth: Payer: Self-pay | Admitting: Gastroenterology

## 2020-09-15 VITALS — BP 117/67 | HR 106 | Temp 98.2°F | Ht 60.5 in | Wt 159.4 lb

## 2020-09-15 DIAGNOSIS — K641 Second degree hemorrhoids: Secondary | ICD-10-CM

## 2020-09-15 DIAGNOSIS — K0510 Chronic gingivitis, plaque induced: Secondary | ICD-10-CM

## 2020-09-15 DIAGNOSIS — K625 Hemorrhage of anus and rectum: Secondary | ICD-10-CM

## 2020-09-15 MED ORDER — MOUTH WASH-GP PO LIQD: 5.0000 mL | Freq: Four times a day (QID) | ORAL | 0 refills | Status: DC | PRN

## 2020-09-15 NOTE — Telephone Encounter (Signed)
Was a medication suppose to be call in for patient?

## 2020-09-15 NOTE — Progress Notes (Signed)
PROCEDURE NOTE: The patient presents with symptomatic grade 1 hemorrhoids, unresponsive to maximal medical therapy, requesting rubber band ligation of his/her hemorrhoidal disease.  All risks, benefits and alternative forms of therapy were described and informed consent was obtained.  The decision was made to band the LL internal hemorrhoid, and the [facility] O'Regan System was used to perform band ligation without complication.  Digital anorectal examination was then performed to assure proper positioning of the band, and to adjust the banded tissue as required.  The patient was discharged home without pain or other issues.  Dietary and behavioral recommendations were given and (if necessary - prescriptions were given), along with follow-up instructions.  The patient will return as needed for follow-up and possible additional banding as required.  No complications were encountered and the patient tolerated the procedure well.   

## 2020-09-15 NOTE — Telephone Encounter (Signed)
Patient stated that she was waiting at the Pharmacy for her prescription to be sent over and nothing has been sent, per patient. Clinical staff will follow up with patient.

## 2020-09-16 ENCOUNTER — Telehealth: Payer: Self-pay | Admitting: Gastroenterology

## 2020-09-16 NOTE — Telephone Encounter (Signed)
Patient called and reported that her band came off that was placed yesterday when she had a BM. Please call to advise

## 2020-09-16 NOTE — Telephone Encounter (Signed)
I can place another band in 2weeks since it fell off earlier than expected  RV

## 2020-09-16 NOTE — Telephone Encounter (Signed)
Tried to call pharmacy they do not open till 59

## 2020-09-16 NOTE — Telephone Encounter (Signed)
Made appointment 09/30/20 at 1:00

## 2020-09-16 NOTE — Telephone Encounter (Signed)
Pharmacy states patient pick this up yesterday

## 2020-09-19 ENCOUNTER — Other Ambulatory Visit: Payer: Self-pay | Admitting: Family Medicine

## 2020-09-19 NOTE — Telephone Encounter (Signed)
Name of Medication: Clonazepam Name of Pharmacy: [place] or Written Date and Quantity: 08/21/20, #30 Last Office Visit and Type: 03/28/20, AWV Next Office Visit and Type: 09/29/20, 6 mo f/u Last Controlled Substance Agreement Date: 09/26/14 Last UDS: 09/26/14

## 2020-09-21 NOTE — Telephone Encounter (Signed)
ERx 

## 2020-09-25 ENCOUNTER — Telehealth: Payer: Self-pay | Admitting: Cardiology

## 2020-09-25 MED ORDER — IRBESARTAN 300 MG PO TABS
300.0000 mg | ORAL_TABLET | Freq: Every day | ORAL | 0 refills | Status: DC
Start: 2020-09-25 — End: 2020-12-19

## 2020-09-25 NOTE — Telephone Encounter (Signed)
*  STAT* If patient is at the pharmacy, call can be transferred to refill team.   1. Which medications need to be refilled? (please list name of each medication and dose if known) Irbesartan 300MG , 1 tablet daily   2. Which pharmacy/location (including street and city if local pharmacy) is medication to be sent to?[facility] on [address]  3. Do they need a 30 day or 90 day supply? 90 day

## 2020-09-25 NOTE — Telephone Encounter (Signed)
Requested Prescriptions   Signed Prescriptions Disp Refills   irbesartan (AVAPRO) 300 MG tablet 90 tablet 0    Sig: Take 1 tablet (300 mg total) by mouth daily.    Authorizing Provider: Kate Sable    Ordering User: Raelene Bott, Kisha Messman L

## 2020-09-29 ENCOUNTER — Encounter: Payer: Self-pay | Admitting: Family Medicine

## 2020-09-29 ENCOUNTER — Other Ambulatory Visit: Payer: Self-pay

## 2020-09-29 ENCOUNTER — Ambulatory Visit (INDEPENDENT_AMBULATORY_CARE_PROVIDER_SITE_OTHER): Payer: PPO | Admitting: Family Medicine

## 2020-09-29 VITALS — BP 120/70 | HR 85 | Temp 97.7°F | Ht 60.5 in | Wt 158.5 lb

## 2020-09-29 DIAGNOSIS — K5909 Other constipation: Secondary | ICD-10-CM

## 2020-09-29 DIAGNOSIS — I38 Endocarditis, valve unspecified: Secondary | ICD-10-CM | POA: Diagnosis not present

## 2020-09-29 DIAGNOSIS — K581 Irritable bowel syndrome with constipation: Secondary | ICD-10-CM

## 2020-09-29 DIAGNOSIS — K623 Rectal prolapse: Secondary | ICD-10-CM | POA: Diagnosis not present

## 2020-09-29 DIAGNOSIS — I10 Essential (primary) hypertension: Secondary | ICD-10-CM | POA: Diagnosis not present

## 2020-09-29 DIAGNOSIS — N1831 Chronic kidney disease, stage 3a: Secondary | ICD-10-CM | POA: Diagnosis not present

## 2020-09-29 DIAGNOSIS — K648 Other hemorrhoids: Secondary | ICD-10-CM

## 2020-09-29 MED ORDER — SPIRONOLACTONE 25 MG PO TABS
1.0000 | ORAL_TABLET | Freq: Every day | ORAL | 6 refills | Status: DC
Start: 2020-09-29 — End: 2021-03-31

## 2020-09-29 NOTE — Assessment & Plan Note (Signed)
Currently undergoing banding through GI.

## 2020-09-29 NOTE — Progress Notes (Signed)
Patient ID: Brooke Beasley, female    DOB: June 28, 1930, 87 y.o.   MRN: [id number]  This visit was conducted in person.  BP 120/70   Pulse 85   Temp 97.7 F (36.5 C) (Temporal)   Ht 5' 0.5" (1.537 m)   Wt 158 lb 8 oz (71.9 kg)   SpO2 98%   BMI 30.45 kg/m    CC: 6 mo f/u visit  Subjective:   HPI: Brooke Beasley is a 87 y.o. female presenting on 09/29/2020 for Follow-up (Here for 6 mo f/u.  Pt accompanied by daughter, Brooke Beasley- temp 98.2.)   Recent specialist notes reviewed since last seen here.   HTN followed by cardiology, difficult to control - stable period on current regimen. Now on irbesartan 300mg  daily, along with aldactone. Carvedilol was tapered off due to concern over fatigue - feels these symptoms are a little better. Known mild aortic stenosis. Checks BP about twice weekly - well controlled. No vision changes, CP/tightness, leg swelling. Ongoing dyspnea as well as pressure sensation to top of head improved with antihistamine. She has been taking allegra with benefit.   Chronic constipation - manages with [facility] supplement.  Hemorrhoids with rectal prolapse - saw GI s/p int hem banding x3 (last one scheduled for tomorrow). No noted blood in stool. Has decided against proceeding with surgery for rectal prolapse.      Relevant past medical, surgical, family and social history reviewed and updated as indicated. Interim medical history since our last visit reviewed. Allergies and medications reviewed and updated. Outpatient Medications Prior to Visit  Medication Sig Dispense Refill  . acetaminophen (TYLENOL) 500 MG tablet Take 500-1,000 mg by mouth at bedtime. Depends on pain    . albuterol (PROVENTIL HFA;VENTOLIN HFA) 108 (90 Base) MCG/ACT inhaler Inhale 2 puffs into the lungs every 6 (six) hours as needed for wheezing or shortness of breath. 1 Inhaler 0  . bimatoprost (LUMIGAN) 0.01 % SOLN Place 1 drop into both eyes at bedtime.    Marland Kitchen Bioflavonoid  Products (VITAMIN C) CHEW Chew 1 tablet by mouth daily.    . cholecalciferol (VITAMIN D) 1000 units tablet Take 1,000 Units by mouth daily.    . clonazePAM (KLONOPIN) 0.5 MG tablet TAKE 1 TABLET BY MOUTH AT BEDTIME 30 tablet 3  . colchicine 0.6 MG tablet Take 1 tablet (0.6 mg total) by mouth daily as needed (gout flare). 90 tablet 0  . denosumab (PROLIA) 60 MG/ML SOSY injection Inject 60 mg into the skin every 6 (six) months. 1 mL 1  . EUTHYROX 100 MCG tablet Take 1 tablet by mouth once daily 90 tablet 2  . fish oil-omega-3 fatty acids 1000 MG capsule Take 1 g by mouth daily.     . furosemide (LASIX) 20 MG tablet Take 1 tablet (20 mg total) by mouth as needed. 30 tablet 5  . HYDROcodone-acetaminophen (NORCO/VICODIN) 5-325 MG tablet Take 1-2 tablets by mouth every 6 (six) hours as needed. 6 tablet 0  . hydrocortisone (ANUSOL-HC) 2.5 % rectal cream Place 1 application rectally 2 (two) times daily. 30 g 0  . hydrocortisone 2.5 % cream Place rectally.    . irbesartan (AVAPRO) 300 MG tablet Take 1 tablet (300 mg total) by mouth daily. 90 tablet 0  . methocarbamol (ROBAXIN) 500 MG tablet Take 1 tablet (500 mg total) by mouth every 8 (eight) hours as needed for muscle spasms. 8 tablet 0  . Misc Natural Products (OSTEO BI-FLEX TRIPLE STRENGTH PO) Take 1  tablet by mouth daily. With Vit D    . MISC NATURAL PRODUCTS PO Take 2 capsules by mouth 3 (three) times daily. [employer]    . Mouthwash Compounding Base (MOUTH WASH-GP) LIQD Take 5 mLs by mouth every 6 (six) hours as needed. 240 mL 0  . Multiple Vitamins-Minerals (PRESERVISION AREDS 2+MULTI VIT) CAPS Take 1 capsule by mouth daily.    Marland Kitchen omeprazole (PRILOSEC) 20 MG capsule Take 1 capsule (20 mg total) by mouth daily. 90 capsule 3  . Plecanatide (TRULANCE) 3 MG TABS Take 1 tablet by mouth daily. 30 tablet 0  . promethazine-phenylephrine (PROMETHAZINE VC) 6.25-5 MG/5ML SYRP Take 5 mLs by mouth every 4 (four) hours as needed for congestion. 118 mL 0  .  psyllium (METAMUCIL) 58.6 % packet Take by mouth.    . sertraline (ZOLOFT) 100 MG tablet Take 1 tablet (100 mg total) by mouth daily. 90 tablet 3  . Simethicone 180 MG CAPS Take by mouth.    . TURMERIC PO Take 1 tablet by mouth daily.    Marland Kitchen VITAMIN E PO Take 1 tablet by mouth daily.    . VOLTAREN 1 % GEL APPLY ONE APPLICATION TOPICALLY 3 TIMES DAILY 100 g 3  . spironolactone (ALDACTONE) 25 MG tablet Take 1 tablet by mouth once daily 30 tablet 5   No facility-administered medications prior to visit.     Per HPI unless specifically indicated in ROS section below Review of Systems Objective:  BP 120/70   Pulse 85   Temp 97.7 F (36.5 C) (Temporal)   Ht 5' 0.5" (1.537 m)   Wt 158 lb 8 oz (71.9 kg)   SpO2 98%   BMI 30.45 kg/m   Wt Readings from Last 3 Encounters:  09/29/20 158 lb 8 oz (71.9 kg)  09/15/20 159 lb 6 oz (72.3 kg)  08/15/20 160 lb (72.6 kg)      Physical Exam Vitals and nursing note reviewed.  Constitutional:      Appearance: Normal appearance. She is not ill-appearing.  Neck:     Thyroid: No thyroid mass or thyromegaly.  Cardiovascular:     Rate and Rhythm: Normal rate and regular rhythm.     Pulses: Normal pulses.     Heart sounds: Murmur (3/6 systolic) heard.    Pulmonary:     Effort: Pulmonary effort is normal. No respiratory distress.     Breath sounds: Normal breath sounds. No wheezing, rhonchi or rales.  Musculoskeletal:     Right lower leg: No edema.     Left lower leg: No edema.  Skin:    General: Skin is warm and dry.     Findings: No rash.  Neurological:     Mental Status: She is alert.  Psychiatric:        Mood and Affect: Mood normal.        Behavior: Behavior normal.       Assessment & Plan:  This visit occurred during the SARS-CoV-2 public health emergency.  Safety protocols were in place, including screening questions prior to the visit, additional usage of staff PPE, and extensive cleaning of exam room while observing appropriate  contact time as indicated for disinfecting solutions.   Problem List Items Addressed This Visit    Hypertension, essential - Primary    Chronic, stable on current regimen. Appreciate cardiology care to find optimal antihypertensive regimen for patient. Seems to be doing well only on spironolactone and irbesartan - continue.       Relevant  Medications   spironolactone (ALDACTONE) 25 MG tablet   Other Relevant Orders   Basic metabolic panel   Internal hemorrhoids    Currently undergoing banding through GI.       Relevant Medications   spironolactone (ALDACTONE) 25 MG tablet   Chronic constipation with overflow incontinence    Chronic issue, has seen GI. Now managed with supplement.       Irritable bowel syndrome with constipation   Valvular heart disease    Echo 11/2019 showed mild AS, mild-mod MR.  Continue to monitor.       Relevant Medications   spironolactone (ALDACTONE) 25 MG tablet   CKD (chronic kidney disease) stage 3, GFR 30-59 ml/min (HCC)    Update K/Cr on new irbesartan + spironolactone.       Rectal prolapse    Has decided against surgery           Meds ordered this encounter  Medications  . spironolactone (ALDACTONE) 25 MG tablet    Sig: Take 1 tablet (25 mg total) by mouth daily.    Dispense:  30 tablet    Refill:  6   Orders Placed This Encounter  Procedures  . Basic metabolic panel    Patient Instructions  You are doing well today  Continue current medicines Labs today.  Return as needed or in 6 months for physical/wellness visit   Follow up plan: Return in about 6 months (around 03/31/2021) for medicare wellness visit, annual exam, prior fasting for blood work.  [name], MD

## 2020-09-29 NOTE — Assessment & Plan Note (Signed)
Chronic, stable on current regimen. Appreciate cardiology care to find optimal antihypertensive regimen for patient. Seems to be doing well only on spironolactone and irbesartan - continue.

## 2020-09-29 NOTE — Assessment & Plan Note (Signed)
Has decided against surgery

## 2020-09-29 NOTE — Patient Instructions (Addendum)
You are doing well today  Continue current medicines Labs today.  Return as needed or in 6 months for physical/wellness visit

## 2020-09-29 NOTE — Assessment & Plan Note (Signed)
Echo 11/2019 showed mild AS, mild-mod MR.  Continue to monitor.

## 2020-09-29 NOTE — Assessment & Plan Note (Signed)
Chronic issue, has seen GI. Now managed with supplement.

## 2020-09-29 NOTE — Assessment & Plan Note (Addendum)
Update K/Cr on new irbesartan + spironolactone.

## 2020-09-30 ENCOUNTER — Encounter: Payer: Self-pay | Admitting: Gastroenterology

## 2020-09-30 ENCOUNTER — Ambulatory Visit: Payer: PPO | Admitting: Gastroenterology

## 2020-09-30 ENCOUNTER — Other Ambulatory Visit: Payer: Self-pay

## 2020-09-30 VITALS — BP 132/73 | HR 85 | Temp 97.6°F | Ht 60.5 in | Wt 158.5 lb

## 2020-09-30 DIAGNOSIS — K625 Hemorrhage of anus and rectum: Secondary | ICD-10-CM

## 2020-09-30 DIAGNOSIS — K641 Second degree hemorrhoids: Secondary | ICD-10-CM

## 2020-09-30 LAB — BASIC METABOLIC PANEL
BUN: 22 mg/dL (ref 6–23)
CO2: 26 mEq/L (ref 19–32)
Calcium: 9.7 mg/dL (ref 8.4–10.5)
Chloride: 94 mEq/L — ABNORMAL LOW (ref 96–112)
Creatinine, Ser: 1.09 mg/dL (ref 0.40–1.20)
GFR: 45.57 mL/min — ABNORMAL LOW (ref >60.00)
Glucose, Bld: 132 mg/dL — ABNORMAL HIGH (ref 70–99)
Potassium: 4.2 mEq/L (ref 3.5–5.1)
Sodium: 130 mEq/L — ABNORMAL LOW (ref 135–145)

## 2020-09-30 NOTE — Progress Notes (Signed)
PROCEDURE NOTE: The patient presents with symptomatic grade 1 hemorrhoids, unresponsive to maximal medical therapy, requesting rubber band ligation of his/her hemorrhoidal disease.  All risks, benefits and alternative forms of therapy were described and informed consent was obtained.  The decision was made to band the LL internal hemorrhoid, and the [facility] O'Regan System was used to perform band ligation without complication.  Digital anorectal examination was then performed to assure proper positioning of the band, and to adjust the banded tissue as required.  The patient was discharged home without pain or other issues.  Dietary and behavioral recommendations were given and (if necessary - prescriptions were given), along with follow-up instructions.  The patient will return as needed for follow-up and possible additional banding as required.  No complications were encountered and the patient tolerated the procedure well.   

## 2020-10-02 ENCOUNTER — Other Ambulatory Visit: Payer: Self-pay

## 2020-10-02 MED ORDER — OMEPRAZOLE 20 MG PO CPDR
20.0000 mg | DELAYED_RELEASE_CAPSULE | Freq: Every day | ORAL | 1 refills | Status: DC
Start: 2020-10-02 — End: 2021-01-16

## 2020-10-02 MED ORDER — SERTRALINE HCL 100 MG PO TABS: 100.0000 mg | ORAL_TABLET | Freq: Every day | ORAL | 1 refills | Status: DC

## 2020-10-02 NOTE — Telephone Encounter (Signed)
E-scribed refills.  

## 2020-10-05 ENCOUNTER — Other Ambulatory Visit: Payer: Self-pay | Admitting: Family Medicine

## 2020-10-12 ENCOUNTER — Encounter: Payer: Self-pay | Admitting: Family Medicine

## 2020-10-13 ENCOUNTER — Encounter: Payer: Self-pay | Admitting: Gastroenterology

## 2020-10-13 ENCOUNTER — Telehealth (INDEPENDENT_AMBULATORY_CARE_PROVIDER_SITE_OTHER): Payer: PPO | Admitting: Family Medicine

## 2020-10-13 ENCOUNTER — Encounter: Payer: Self-pay | Admitting: Family Medicine

## 2020-10-13 VITALS — BP 130/79 | HR 87 | Temp 99.8°F | Ht 60.5 in | Wt 160.0 lb

## 2020-10-13 DIAGNOSIS — I10 Essential (primary) hypertension: Secondary | ICD-10-CM | POA: Diagnosis not present

## 2020-10-13 DIAGNOSIS — U071 COVID-19: Secondary | ICD-10-CM

## 2020-10-13 DIAGNOSIS — N1831 Chronic kidney disease, stage 3a: Secondary | ICD-10-CM

## 2020-10-13 DIAGNOSIS — E871 Hypo-osmolality and hyponatremia: Secondary | ICD-10-CM

## 2020-10-13 HISTORY — DX: COVID-19: U07.1

## 2020-10-13 MED ORDER — MOLNUPIRAVIR EUA 200MG CAPSULE: 4.0000 | ORAL_CAPSULE | Freq: Two times a day (BID) | ORAL | 0 refills | Status: AC

## 2020-10-13 MED ORDER — ONDANSETRON HCL 4 MG PO TABS: 4.0000 mg | ORAL_TABLET | Freq: Three times a day (TID) | ORAL | 0 refills | Status: DC | PRN

## 2020-10-13 NOTE — Telephone Encounter (Signed)
Brooke Beasley called and left a message on triage asking what to do since she is having possible side effects of the medication. I left a message on VM advising her Dr Danise Mina wants to do a virtual visit today. Asked her to call the office and tell whoever answers that Dr Darnell Level put times in the MyChart message to add her on.

## 2020-10-13 NOTE — Progress Notes (Signed)
Patient ID: Brooke Beasley, female    DOB: 03-Feb-1931, 87 y.o.   MRN: [id number]  Virtual visit completed through [employer], a video enabled telemedicine application. Due to national recommendations of social distancing due to COVID-19, a virtual visit is felt to be most appropriate for this patient at this time. Reviewed limitations, risks, security and privacy concerns of performing a virtual visit and the availability of in person appointments. I also reviewed that there may be a patient responsible charge related to this service. The patient agreed to proceed.   Patient location: home Provider location: [place] at [facility], office Persons participating in this virtual visit: patient, provider, daughter Brooke Guiles  If any vitals were documented, they were collected by patient at home unless specified below.    BP 130/79   Pulse 87   Temp 99.8 F (37.7 C)   Ht 5' 0.5" (1.537 m)   Wt 160 lb (72.6 kg)   SpO2 96%   BMI 30.73 kg/m    CC: COVID infection Subjective:   HPI: Brooke Beasley is a 87 y.o. female presenting on 10/13/2020 for Cough (C/o cough, nausea, vomiting, HA and fever- max 103.  Sxs started 10/11/20.    Home COVID test on 10/12/20, positive results. )   First day of symptoms: 6/18/202 Tested COVID positive: 10/12/2020  Current symptoms: initial rhinorrhea, hoarseness, productive cough, nausea, vomiting, headache, fever Tmax 103.  No: loss of taste or smell, new diarrhea, dyspnea.  Treatments to date: tylenol.  Daughter gave her some of her molnupiravir doses but today.  Risk factors include: age, CKD, HTN  Daughter also tested positive recently  [place] vaccination status: [place] 07/2019, 08/2019     Relevant past medical, surgical, family and social history reviewed and updated as indicated. Interim medical history since our last visit reviewed. Allergies and medications reviewed and updated. Outpatient Medications Prior to Visit  Medication Sig  Dispense Refill   acetaminophen (TYLENOL) 500 MG tablet Take 500-1,000 mg by mouth at bedtime. Depends on pain     albuterol (PROVENTIL HFA;VENTOLIN HFA) 108 (90 Base) MCG/ACT inhaler Inhale 2 puffs into the lungs every 6 (six) hours as needed for wheezing or shortness of breath. 1 Inhaler 0   bimatoprost (LUMIGAN) 0.01 % SOLN Place 1 drop into both eyes at bedtime.     Bioflavonoid Products (VITAMIN C) CHEW Chew 1 tablet by mouth daily.     cholecalciferol (VITAMIN D) 1000 units tablet Take 1,000 Units by mouth daily.     clonazePAM (KLONOPIN) 0.5 MG tablet TAKE 1 TABLET BY MOUTH AT BEDTIME 30 tablet 3   colchicine 0.6 MG tablet Take 1 tablet (0.6 mg total) by mouth daily as needed (gout flare). 90 tablet 0   denosumab (PROLIA) 60 MG/ML SOSY injection Inject 60 mg into the skin every 6 (six) months. 1 mL 1   EUTHYROX 100 MCG tablet Take 1 tablet by mouth once daily 90 tablet 2   fish oil-omega-3 fatty acids 1000 MG capsule Take 1 g by mouth daily.      furosemide (LASIX) 20 MG tablet Take 1 tablet (20 mg total) by mouth as needed. 30 tablet 5   HYDROcodone-acetaminophen (NORCO/VICODIN) 5-325 MG tablet Take 1-2 tablets by mouth every 6 (six) hours as needed. 6 tablet 0   hydrocortisone (ANUSOL-HC) 2.5 % rectal cream Place 1 application rectally 2 (two) times daily. 30 g 0   hydrocortisone 2.5 % cream Place rectally.     irbesartan (AVAPRO) 300 MG  tablet Take 1 tablet (300 mg total) by mouth daily. 90 tablet 0   methocarbamol (ROBAXIN) 500 MG tablet Take 1 tablet (500 mg total) by mouth every 8 (eight) hours as needed for muscle spasms. 8 tablet 0   Misc Natural Products (OSTEO BI-FLEX TRIPLE STRENGTH PO) Take 1 tablet by mouth daily. With Vit D     MISC NATURAL PRODUCTS PO Take 2 capsules by mouth 3 (three) times daily. [place]     Mouthwash Compounding Base (MOUTH WASH-GP) LIQD Take 5 mLs by mouth every 6 (six) hours as needed. 240 mL 0   Multiple Vitamins-Minerals (PRESERVISION AREDS  2+MULTI VIT) CAPS Take 1 capsule by mouth daily.     omeprazole (PRILOSEC) 20 MG capsule Take 1 capsule (20 mg total) by mouth daily. 90 capsule 1   Plecanatide (TRULANCE) 3 MG TABS Take 1 tablet by mouth daily. 30 tablet 0   promethazine-phenylephrine (PROMETHAZINE VC) 6.25-5 MG/5ML SYRP Take 5 mLs by mouth every 4 (four) hours as needed for congestion. 118 mL 0   psyllium (METAMUCIL) 58.6 % packet Take by mouth.     sertraline (ZOLOFT) 100 MG tablet Take 0.5 tablets (50 mg total) by mouth daily.     Simethicone 180 MG CAPS Take by mouth.     spironolactone (ALDACTONE) 25 MG tablet Take 1 tablet (25 mg total) by mouth daily. 30 tablet 6   TURMERIC PO Take 1 tablet by mouth daily.     VITAMIN E PO Take 1 tablet by mouth daily.     VOLTAREN 1 % GEL APPLY ONE APPLICATION TOPICALLY 3 TIMES DAILY 100 g 3   No facility-administered medications prior to visit.     Per HPI unless specifically indicated in ROS section below Review of Systems Objective:  BP 130/79   Pulse 87   Temp 99.8 F (37.7 C)   Ht 5' 0.5" (1.537 m)   Wt 160 lb (72.6 kg)   SpO2 96%   BMI 30.73 kg/m   Wt Readings from Last 3 Encounters:  10/13/20 160 lb (72.6 kg)  09/30/20 158 lb 8 oz (71.9 kg)  09/29/20 158 lb 8 oz (71.9 kg)       Physical exam: Gen: alert, NAD, not ill appearing Pulm: speaks in complete sentences without increased work of breathing Psych: normal mood, normal thought content      Results for orders placed or performed in visit on 19/50/93  Basic metabolic panel  Result Value Ref Range   Sodium 130 (L) 135 - 145 mEq/L   Potassium 4.2 3.5 - 5.1 mEq/L   Chloride 94 (L) 96 - 112 mEq/L   CO2 26 19 - 32 mEq/L   Glucose, Bld 132 (H) 70 - 99 mg/dL   BUN 22 6 - 23 mg/dL   Creatinine, Ser 1.09 0.40 - 1.20 mg/dL   GFR 45.57 (L) >60.00 mL/min   Calcium 9.7 8.4 - 10.5 mg/dL   Assessment & Plan:   Problem List Items Addressed This Visit     Hypertension, essential    Chronic, stable. Continue  to monitor blood pressures at home.        Hyponatremia    Recent Na 130. Still needs to schedule recheck. Will defer by a few weeks with recent COVID-19 infection, low threshold to recheck in setting of poor PO intake.       CKD (chronic kidney disease) stage 3, GFR 30-59 ml/min (HCC)    Chronic, overall stable.  COVID-19 virus infection - Primary    Recommend: molnupiravir Reviewed how it is approved under EUA, reviewed side effects to monitor for.  Discussed anticipated course of recovery as well as red flags to seek urgent in-person care. Reviewed latest CDC isolation/quarantining guidelines.  Encouraged fluids and rest.  Reviewed further supportive care measures at home including vit C, vit D, zinc, tylenol PRN.         Relevant Medications   molnupiravir EUA 200 mg CAPS     Meds ordered this encounter  Medications   molnupiravir EUA 200 mg CAPS    Sig: Take 4 capsules (800 mg total) by mouth 2 (two) times daily for 5 days.    Dispense:  40 capsule    Refill:  0   ondansetron (ZOFRAN) 4 MG tablet    Sig: Take 1 tablet (4 mg total) by mouth every 8 (eight) hours as needed for nausea or vomiting.    Dispense:  20 tablet    Refill:  0   No orders of the defined types were placed in this encounter.   I discussed the assessment and treatment plan with the patient. The patient was provided an opportunity to ask questions and all were answered. The patient agreed with the plan and demonstrated an understanding of the instructions. The patient was advised to call back or seek an in-person evaluation if the symptoms worsen or if the condition fails to improve as anticipated.  Follow up plan: Return if symptoms worsen or fail to improve.  [name], MD

## 2020-10-13 NOTE — Assessment & Plan Note (Addendum)
Recent Na 130. Still needs to schedule recheck. Will defer by a few weeks with recent COVID-19 infection, low threshold to recheck in setting of poor PO intake.

## 2020-10-13 NOTE — Telephone Encounter (Signed)
Plz schedule virtual for 12:30pm or 4:30pm

## 2020-10-13 NOTE — Telephone Encounter (Signed)
MyChart visit scheduled today at 12:30.

## 2020-10-13 NOTE — Telephone Encounter (Signed)
Patient is scheduled for virtual today at 12:30 pm

## 2020-10-13 NOTE — Telephone Encounter (Signed)
PLEASE NOTE: All timestamps contained within this report are represented as [place] Standard Time. CONFIDENTIALTY NOTICE: This fax transmission is intended only for the addressee. It contains information that is legally privileged, confidential or otherwise protected from use or disclosure. If you are not the intended recipient, you are strictly prohibited from reviewing, disclosing, copying using or disseminating any of this information or taking any action in reliance on or regarding this information. If you have received this fax in error, please notify us immediately by telephone so that we can arrange for its return to [place]. Phone: [phone], Toll-Free: [phone], Fax: [phone] Page: 1 of 2 Call Id: [id number] [place] RECORD AccessNurse Patient Name: Brooke Beasley UDJS Gender: Female DOB: 23-May-1930 Age: 87 Y 10 M 22 D Return Phone Number: [phone] (Primary), [phone] (Secondary) Address: City/ State/ Zip: [place]  [address] Client [place] Night - Client Client Site [place] Physician [name] - MD Contact Type Call Who Is Calling Patient / Member / Family / Caregiver Call Type Triage / Clinical Caller Name Antoine Primas Relationship To Patient Daughter Return Phone Number [phone] (Primary) Chief Complaint Cough Reason for Call Medication Question / Request Initial Comment Caller states her mom is COVID positive and she would like an Rx for her. Sx of cough, nausea and feels strange. Translation No Nurse Assessment Nurse: Marlowe Sax, RN, Tannia Date/Time (Eastern Time): 10/12/2020 2:02:37 PM Confirm and document reason for call. If symptomatic, describe symptoms. ---Caller states her mom is COVID positive today and she would like an Rx for her. Symptoms of cough, nausea and overall. feels unwell. Symptoms started for the  last two days. Does the patient have any new or worsening symptoms? ---Yes Will a triage be completed? ---Yes Related visit to physician within the last 2 weeks? ---No Does the PT have any chronic conditions? (i.e. diabetes, asthma, this includes High risk factors for pregnancy, etc.) ---Yes List chronic conditions. ---HTN, heart murmur, hypothyroidism Is this a behavioral health or substance abuse call? ---No Guidelines Guideline Title Affirmed Question Affirmed Notes Nurse Date/Time ([place] Time) COVID-19 - Diagnosed or Suspected Patient sounds very sick or weak to the triager Marlowe Sax, [place], [place] 10/12/2020 2:06:02 PM Disp. Time Eilene Ghazi Time) Disposition Final User 10/12/2020 2:13:42 PM Go to ED Now (or PCP triage) Yes Marlowe Sax, RN, Tannia PLEASE NOTE: All timestamps contained within this report are represented as [place] Standard Time. CONFIDENTIALTY NOTICE: This fax transmission is intended only for the addressee. It contains information that is legally privileged, confidential or otherwise protected from use or disclosure. If you are not the intended recipient, you are strictly prohibited from reviewing, disclosing, copying using or disseminating any of this information or taking any action in reliance on or regarding this information. If you have received this fax in error, please notify us immediately by telephone so that we can arrange for its return to [place]. Phone: [phone], Toll-Free: [phone], Fax: [phone] Page: 2 of 2 Call Id: [id number] [place] Disagree/Comply Disagree Caller Understands Yes PreDisposition Did not know what to do Care Advice Given Per Guideline GO TO ED NOW (OR PCP TRIAGE): * IF PCP SECOND-LEVEL TRIAGE REQUIRED: You may need to be seen. Your doctor (or NP/PA) will want to talk with you to decide what's best. I'll page the provider on-call now. If you haven't heard from the provider (or me) within 30 minutes, go directly to the [place] at  ____________[facility] Hospital.  CARE ADVICE given per COVID-19 - DIAGNOSED OR SUSPECTED (Adult) guideline. Comments User: Orvilla Cornwall, RN Date/Time Eilene Ghazi Time): 10/12/2020 2:05:52 PM Vaccinated for COVID but has not received boosters User: Orvilla Cornwall, RN Date/Time (Eastern Time): 10/12/2020 2:10:17 PM pulse 73 pulse ox 92-93% Referrals GO TO FACILITY REFUSED

## 2020-10-13 NOTE — Assessment & Plan Note (Signed)
Chronic, overall stable.  

## 2020-10-13 NOTE — Assessment & Plan Note (Signed)
Recommend: molnupiravir Reviewed how it is approved under EUA, reviewed side effects to monitor for.  Discussed anticipated course of recovery as well as red flags to seek urgent in-person care. Reviewed latest CDC isolation/quarantining guidelines.  Encouraged fluids and rest.  Reviewed further supportive care measures at home including vit C, vit D, zinc, tylenol PRN.

## 2020-10-13 NOTE — Assessment & Plan Note (Signed)
Chronic, stable. Continue to monitor blood pressures at home.

## 2020-10-14 ENCOUNTER — Telehealth: Payer: Self-pay | Admitting: Gastroenterology

## 2020-10-14 NOTE — Telephone Encounter (Signed)
[name] to Lin Landsman, MD    10:08 PM Mom bleeds when she uses the bathroom twice a day. It seems to not be improving. Here is the Morning and evening photos. She tested positive for covid Sunday. I'll call tomorrow for a virtual appointment to see what you recommend or if you can call. Thank you.    Daughter calling to f/u on a MyChart message.

## 2020-10-14 NOTE — Telephone Encounter (Signed)
I have sent mychart message to Dr. Marius Ditch she is in clinic and will review message as soon as she can

## 2020-10-17 ENCOUNTER — Ambulatory Visit: Payer: PPO | Admitting: Cardiology

## 2020-10-24 ENCOUNTER — Telehealth: Payer: Self-pay | Admitting: *Deleted

## 2020-10-24 NOTE — Chronic Care Management (AMB) (Signed)
  Chronic Care Management   Note  10/24/2020 Name: Brooke Beasley MRN: [id number] DOB: 13-Oct-1930  Brooke Beasley is a 87 y.o. year old female who is a primary care patient of [name], MD. I reached out to [name] by phone today in response to a referral sent by [name]'s PCP [name], MD     [name] was given information about Chronic Care Management services today including:  CCM service includes personalized support from designated clinical staff supervised by her physician, including individualized plan of care and coordination with other care providers 24/7 contact phone numbers for assistance for urgent and routine care needs. Service will only be billed when office clinical staff spend 20 minutes or more in a month to coordinate care. Only one practitioner may furnish and bill the service in a calendar month. The patient may stop CCM services at any time (effective at the end of the month) by phone call to the office staff. The patient will be responsible for cost sharing (co-pay) of up to 20% of the service fee (after annual deductible is met).  Patient agreed to services and verbal consent obtained.   Follow up plan: Telephone appointment with care management team member scheduled for:11/03/2020  Julian Hy, [place] [facility]  Direct Dial: [phone]

## 2020-10-27 ENCOUNTER — Emergency Department (HOSPITAL_BASED_OUTPATIENT_CLINIC_OR_DEPARTMENT_OTHER)
Admission: EM | Admit: 2020-10-27 | Discharge: 2020-10-27 | Disposition: A | Discharge status: Home / Self Care | Payer: PPO | Attending: Emergency Medicine | Admitting: Emergency Medicine

## 2020-10-27 ENCOUNTER — Other Ambulatory Visit: Payer: Self-pay

## 2020-10-27 ENCOUNTER — Encounter (HOSPITAL_BASED_OUTPATIENT_CLINIC_OR_DEPARTMENT_OTHER): Payer: Self-pay | Admitting: Emergency Medicine

## 2020-10-27 DIAGNOSIS — Z8616 Personal history of COVID-19: Secondary | ICD-10-CM | POA: Insufficient documentation

## 2020-10-27 DIAGNOSIS — Z96612 Presence of left artificial shoulder joint: Secondary | ICD-10-CM | POA: Diagnosis not present

## 2020-10-27 DIAGNOSIS — Z79899 Other long term (current) drug therapy: Secondary | ICD-10-CM | POA: Insufficient documentation

## 2020-10-27 DIAGNOSIS — G8929 Other chronic pain: Secondary | ICD-10-CM | POA: Diagnosis not present

## 2020-10-27 DIAGNOSIS — M25511 Pain in right shoulder: Secondary | ICD-10-CM | POA: Diagnosis not present

## 2020-10-27 DIAGNOSIS — N183 Chronic kidney disease, stage 3 unspecified: Secondary | ICD-10-CM | POA: Insufficient documentation

## 2020-10-27 DIAGNOSIS — I10 Essential (primary) hypertension: Secondary | ICD-10-CM | POA: Diagnosis not present

## 2020-10-27 DIAGNOSIS — I129 Hypertensive chronic kidney disease with stage 1 through stage 4 chronic kidney disease, or unspecified chronic kidney disease: Secondary | ICD-10-CM | POA: Diagnosis not present

## 2020-10-27 DIAGNOSIS — E039 Hypothyroidism, unspecified: Secondary | ICD-10-CM | POA: Diagnosis not present

## 2020-10-27 MED ORDER — PREDNISONE 10 MG PO TABS: 40.0000 mg | ORAL_TABLET | Freq: Every day | ORAL | 0 refills | Status: AC

## 2020-10-27 MED ORDER — KETOROLAC TROMETHAMINE 15 MG/ML IJ SOLN
15.0000 mg | Freq: Once | INTRAMUSCULAR | Status: AC
  Administered 2020-10-27: 15 mg via INTRAMUSCULAR
  Filled 2020-10-27: qty 1

## 2020-10-27 MED ORDER — TRAMADOL HCL 50 MG PO TABS: 50.0000 mg | ORAL_TABLET | Freq: Two times a day (BID) | ORAL | 0 refills | Status: DC | PRN

## 2020-10-27 NOTE — ED Triage Notes (Signed)
Reports right shoulder pain since yesterday.  Thinks it is her gout.  Had 4 colchicine and an aleve today with little relief.  Also having n/v from the pain.

## 2020-10-27 NOTE — Discharge Instructions (Addendum)
You were seen in the emergency department today for your shoulder pain.  Your physical exam and vital signs are reassuring.  You were administered a dose of Toradol injection in the emergency department, which is an anti-inflammatory medicine.  You have been prescribed a few days of steroid to take at home.   Return to the emergency department if you develop any new numbness, tingling, weakness in your arm, or any other new serious symptoms..  Please follow-up in the outpatient setting with your orthopedic provider.

## 2020-10-27 NOTE — ED Provider Notes (Signed)
I personally evaluated the patient during the encounter and completed a history, physical, procedures, medical decision making to contribute to the overall care of the patient and decision making for the patient briefly, the patient is a 87 y.o. female with right shoulder pain.  Acute on chronic.  History of gout presumably in the right shoulder in the past.  Denies any trauma or falls.  EKG shows sinus rhythm.  No ischemic changes.  Not having any chest pain or shortness of breath.  Appears to be muscular in nature.  She has overall good range of motion of the right shoulder but with pain.  There is no redness or erythema.  Overall suspect inflammatory process.  We will place her in a sling for comfort.  Given a dose of Toradol.  Will prescribe prednisone, tramadol for breakthrough pain.  Recommend Tylenol and ibuprofen as needed at home.  Recommend follow-up with orthopedics.  Neurovascularly neuromuscularly intact.  This chart was dictated using voice recognition software.  Despite best efforts to proofread,  errors can occur which can change the documentation meaning.    EKG Interpretation  Date/Time:  Monday October 27 2020 15:00:55 EDT Ventricular Rate:  75 PR Interval:  164 QRS Duration: 90 QT Interval:  401 QTC Calculation: 448 R Axis:   33 Text Interpretation: Sinus rhythm Confirmed by Lennice Sites ([id number]) on 10/27/2020 3:04:29 PM             Lennice Sites, DO 10/27/20 1516

## 2020-10-27 NOTE — ED Provider Notes (Signed)
[facility] EMERGENCY DEPARTMENT Provider Note   CSN: [id number] Arrival date & time: 10/27/20  1345     History Chief Complaint  Patient presents with   Shoulder Pain    Brooke Beasley is a 87 y.o. female with history of adhesive capsulitis and pseudogout in the shoulders bilaterally presents with worsening right shoulder pain.  Constantly has some pain but has been worse since yesterday.  Denies any recent trauma, swelling to the area, redness.  Pain is primarily in the lateral shoulder "on the inside".  Has been taking colchicine and Aleve without much relief.  I personally reviewed this patient's medical records.  She history of hypothyroidism, valvular disease, prediabetes, GERD, CKD, pseudogout, adhesive capsulitis of the shoulder.  HPI     Past Medical History:  Diagnosis Date   Anxiety    C. difficile diarrhea 07/11/2018   S/p multiple oral vanc treatments (course and tapers) and completed Zinplava monoclonal Ab treatment (05/10/2019) Fecal transplant on hold during COVID pandemic   CAP (community acquired pneumonia) 12/22/2017   Carotid stenosis 10/18/2014   R 1-39%, L 40-59%, rpt 1 yr (09/2014)    CKD (chronic kidney disease) stage 3, GFR 30-59 ml/min (HCC) 08/02/2014   Clostridioides difficile infection    hx 2020   Degenerative disc disease    LS   Depression    nervous breakdonw in 1964-out of work for a year   Diverticulosis    severe by colonoscopy   Family history of adverse reaction to anesthesia    n/v   GERD (gastroesophageal reflux disease)    Glaucoma    Heart murmur 5/13   mitral regurge - on echo    History of hiatal hernia    History of shingles    HTN (hypertension)    Hyperlipidemia    Hypertension    Hypothyroidism    IBS 11/02/2006   Intestinal bacterial overgrowth    In small colon   Maxillary fracture ([place]) 04/08/2012   Osteoarthritis 2016   ([facility])   Osteoporosis    dexa 2011   Pseudogout 2016   shoulders ([place])    Pseudogout     Patient Active Problem List   Diagnosis Date Noted   COVID-19 virus infection 10/13/2020   Macular degeneration 12/22/2019   Rectal prolapse 10/30/2019   PAC (premature atrial contraction) 07/09/2019   Injury of right knee 05/22/2019   Pedal edema 12/29/2016   Weakness 04/09/2016   Complete tear of left rotator cuff 02/13/2016   Thumb pain, left 12/22/2015   Status post total shoulder replacement 11/27/2015   Health maintenance examination 10/07/2015   Pseudogout    Primary osteoarthritis of right shoulder 11/21/2014   Left shoulder pain 11/04/2014   Carotid stenosis 10/18/2014   Advanced care planning/counseling discussion 09/26/2014   Diverticulosis of colon without hemorrhage 09/26/2014   CKD (chronic kidney disease) stage 3, GFR 30-59 ml/min (HCC) 08/02/2014   Restless legs 07/04/2014   Hyponatremia 07/04/2014   Dizziness 06/13/2014   Chronic abdominal pain 01/07/2014   Primary osteoarthritis of both hands 11/21/2013   Insomnia 10/23/2013   Medicare annual wellness visit, subsequent 06/08/2013   GERD (gastroesophageal reflux disease) 04/02/2013   Anxiety associated with depression 12/26/2012   Prediabetes 03/20/2012   Valvular heart disease 09/13/2011   Syncope 08/02/2011   Hypertension, essential 02/24/2007   Hypothyroidism 11/02/2006   HLD (hyperlipidemia) 11/02/2006   Glaucoma 11/02/2006   Internal hemorrhoids 11/02/2006   Chronic constipation with overflow incontinence 11/02/2006   Irritable  bowel syndrome with constipation 11/02/2006   Osteoporosis 11/02/2006   Osteoarthritis 10/20/2006    Past Surgical History:  Procedure Laterality Date   barium enema  2012   severe diverticulosis, redundant colon   Bowel obstruction  1999   no surgery in hosp x 3 days   COLONOSCOPY  1. 1999  2. 11/04   1. Not finished  2. Slight hemorrhage rectosigmoid area, severe sig diverticulosis   COLONOSCOPY WITH PROPOFOL N/A 09/10/2019   SSP with dysplasia, TA  Marius Ditch, Tally Due, MD)   Dexa  1. [phone]   2. 9/04  3. 3/08    1. OP  2. OP, borderline, spine -2.44T  3. decreased BMD-OP   DEXA  2011   OP in spine   DG KNEE 1-2 VIEWS BILAT     LS x-ray with degenerative disc and facet change   ESOPHAGOGASTRODUODENOSCOPY     Negative   Hemorrhoid procedure  4/08   JOINT REPLACEMENT     TONSILLECTOMY     TOTAL SHOULDER ARTHROPLASTY Left 11/27/2015   Corky Mull, MD   TOTAL SHOULDER REVISION Left 03/16/2016   Procedure: TOTAL SHOULDER REVISION;  Surgeon: Corky Mull, MD;  Location: [facility] ORS;  Service: Orthopedics;  Laterality: Left;   US ECHOCARDIOGRAPHY  12/3265   Normal systolic fxn with EF 12-45%.  Focal basal septal hypertrophy.  Mild diastolic dysfunction.  Mild MR.     OB History   No obstetric history on file.     Family History  Problem Relation Age of Onset   Diabetes Brother        post-op   Coronary artery disease Brother    Diabetes Paternal Grandfather    Breast cancer Cousin     Social History   Tobacco Use   Smoking status: Never   Smokeless tobacco: Never  Vaping Use   Vaping Use: Never used  Substance Use Topics   Alcohol use: No   Drug use: No    Home Medications Prior to Admission medications   Medication Sig Start Date End Date Taking? Authorizing Provider  acetaminophen (TYLENOL) 500 MG tablet Take 500-1,000 mg by mouth at bedtime. Depends on pain    [provider]  albuterol (PROVENTIL HFA;VENTOLIN HFA) 108 (90 Base) MCG/ACT inhaler Inhale 2 puffs into the lungs every 6 (six) hours as needed for wheezing or shortness of breath. 01/12/18   [name], MD  bimatoprost (LUMIGAN) 0.01 % SOLN Place 1 drop into both eyes at bedtime.    [provider]  Bioflavonoid Products (VITAMIN C) CHEW Chew 1 tablet by mouth daily.    [provider]  cholecalciferol (VITAMIN D) 1000 units tablet Take 1,000 Units by mouth daily.    [provider]  clonazePAM (KLONOPIN) 0.5 MG  tablet TAKE 1 TABLET BY MOUTH AT BEDTIME 09/21/20   [name], MD  colchicine 0.6 MG tablet Take 1 tablet (0.6 mg total) by mouth daily as needed (gout flare). 05/02/19   [name], MD  denosumab [facility]) 60 MG/ML SOSY injection Inject 60 mg into the skin every 6 (six) months. 05/06/20   [name], MD  EUTHYROX 100 MCG tablet Take 1 tablet by mouth once daily 08/04/20   [name], MD  fish oil-omega-3 fatty acids 1000 MG capsule Take 1 g by mouth daily.     [provider]  furosemide (LASIX) 20 MG tablet Take 1 tablet (20 mg total) by mouth as needed. 03/03/20   Kate Sable, MD  HYDROcodone-acetaminophen (NORCO/VICODIN) 5-325 MG tablet Take 1-2 tablets by mouth every 6 (six) hours as needed. 03/18/19   Davonna Belling, MD  hydrocortisone (ANUSOL-HC) 2.5 % rectal cream Place 1 application rectally 2 (two) times daily. 10/23/18   [name], MD  hydrocortisone 2.5 % cream Place rectally. 10/23/18   [provider]  irbesartan (AVAPRO) 300 MG tablet Take 1 tablet (300 mg total) by mouth daily. 09/25/20   Kate Sable, MD  methocarbamol (ROBAXIN) 500 MG tablet Take 1 tablet (500 mg total) by mouth every 8 (eight) hours as needed for muscle spasms. 03/18/19   Davonna Belling, MD  Misc Natural Products (OSTEO BI-FLEX TRIPLE STRENGTH PO) Take 1 tablet by mouth daily. With Vit D    [provider]  MISC NATURAL PRODUCTS PO Take 2 capsules by mouth 3 (three) times daily. [facility]    [provider]  Mouthwash Compounding Base (MOUTH WASH-GP) LIQD Take 5 mLs by mouth every 6 (six) hours as needed. 09/15/20   Lin Landsman, MD  Multiple Vitamins-Minerals (PRESERVISION AREDS 2+MULTI VIT) CAPS Take 1 capsule by mouth daily. 12/22/19   [name], MD  omeprazole (PRILOSEC) 20 MG capsule Take 1 capsule (20 mg total) by mouth daily. 10/02/20   [name], MD  ondansetron (ZOFRAN) 4 MG tablet Take 1 tablet (4 mg  total) by mouth every 8 (eight) hours as needed for nausea or vomiting. 10/13/20   [name], MD  Plecanatide (TRULANCE) 3 MG TABS Take 1 tablet by mouth daily. 07/21/20   Lin Landsman, MD  promethazine-phenylephrine (PROMETHAZINE VC) 6.25-5 MG/5ML SYRP Take 5 mLs by mouth every 4 (four) hours as needed for congestion. 09/18/19   Faustino Congress, NP  psyllium (METAMUCIL) 58.6 % packet Take by mouth.    [provider]  sertraline (ZOLOFT) 100 MG tablet Take 0.5 tablets (50 mg total) by mouth daily. 10/05/20   [name], MD  Simethicone 180 MG CAPS Take by mouth.    [provider]  spironolactone (ALDACTONE) 25 MG tablet Take 1 tablet (25 mg total) by mouth daily. 09/29/20   [name], MD  TURMERIC PO Take 1 tablet by mouth daily.    [provider]  VITAMIN E PO Take 1 tablet by mouth daily.    [provider]  VOLTAREN 1 % GEL APPLY ONE APPLICATION TOPICALLY 3 TIMES DAILY 09/26/14   [name], MD    Allergies    Amlodipine, Prednisone, Ace inhibitors, Alendronate sodium, Losartan, Other, Paroxetine hcl, Risedronate sodium, Silenor [doxepin hcl], Simvastatin, Sulfonamide derivatives, Toprol xl [metoprolol], Tramadol, and Influenza vaccines  Review of Systems   Review of Systems  Constitutional: Negative.   HENT: Negative.    Respiratory: Negative.    Cardiovascular: Negative.   Gastrointestinal: Negative.   Musculoskeletal:  Positive for arthralgias.  Skin: Negative.    Physical Exam Updated Vital Signs BP (!) 174/76 (BP Location: Left Arm)   Pulse 79   Temp 97.8 F (36.6 C) (Oral)   Resp 16   Ht 5\' 1"  (1.549 m)   Wt 72.6 kg   SpO2 99%   BMI 30.23 kg/m   Physical Exam Vitals and nursing note reviewed.  HENT:     Head: Normocephalic and atraumatic.     Mouth/Throat:     Mouth: Mucous membranes are moist.     Pharynx: No oropharyngeal exudate or posterior oropharyngeal erythema.  Eyes:     General:         Right eye:  No discharge.        Left eye: No discharge.     Extraocular Movements: Extraocular movements intact.     Conjunctiva/sclera: Conjunctivae normal.     Pupils: Pupils are equal, round, and reactive to light.  Cardiovascular:     Rate and Rhythm: Normal rate and regular rhythm.     Pulses: Normal pulses.          Radial pulses are 2+ on the right side.     Heart sounds: Normal heart sounds. No murmur heard.    Comments: 2+ brachial pulse on the right Pulmonary:     Effort: Pulmonary effort is normal. No respiratory distress.     Breath sounds: Normal breath sounds. No wheezing or rales.  Abdominal:     General: Bowel sounds are normal. There is no distension.     Palpations: Abdomen is soft.     Tenderness: There is no abdominal tenderness. There is no guarding or rebound.  Musculoskeletal:        General: No deformity.     Right shoulder: Tenderness and bony tenderness present. No swelling, deformity, effusion, laceration or crepitus. Decreased range of motion.     Left shoulder: Normal.     Right upper arm: Normal.     Left upper arm: Normal.     Right elbow: Normal.     Left elbow: Normal.     Right wrist: Normal.     Left wrist: Normal.     Cervical back: Neck supple.     Comments: Patient unwilling to actively range the right shoulder, unwilling to allow to be passively ranged either.  Normal neurovascular status in the right arm, tenderness palpation of the right lateral shoulder without crepitus, induration, erythema, or induration.  Skin:    General: Skin is warm and dry.     Capillary Refill: Capillary refill takes less than 2 seconds.  Neurological:     General: No focal deficit present.     Mental Status: She is alert and oriented to person, place, and time. Mental status is at baseline.  Psychiatric:        Mood and Affect: Mood normal.    ED Results / Procedures / Treatments   Labs (all labs ordered are listed, but only abnormal results are  displayed) Labs Reviewed - No data to display  EKG EKG Interpretation  Date/Time:  Monday October 27 2020 15:00:55 EDT Ventricular Rate:  75 PR Interval:  164 QRS Duration: 90 QT Interval:  401 QTC Calculation: 448 R Axis:   33 Text Interpretation: Sinus rhythm Confirmed by Ronnald Nian, Adam ([id number]) on 10/27/2020 3:04:29 PM  Radiology No results found.  Procedures Procedures   Medications Ordered in ED Medications  ketorolac (TORADOL) 15 MG/ML injection 15 mg (has no administration in time range)    ED Course  I have reviewed the triage vital signs and the nursing notes.  Pertinent labs & imaging results that were available during my care of the patient were reviewed by me and considered in my medical decision making (see chart for details).    MDM Rules/Calculators/A&P                         87 year old female with acute on chronic right shoulder pain that started yesterday without trauma.  No fevers or chills.  No other systemic symptoms.  Differential diagnosis includes but is not limited to Acute fracture, dislocation, adhesive capsulitis, tendinous injury gout/pseudogout, arthritis, muscular  strain.  Hypertensive on intake, vitals otherwise normal.  Patient with 2+ brachial and radial pulses on the right as well as normal cap refill in all 5 digits of the right hand.  Normal strength and sensation in the hand.  Unwilling to allow the shoulder to be ranged either actively or passively due to pain, however patient is tender to palpation over the lateral right shoulder without erythema, induration, crepitus, or signs of infection.  Suspect chronic arthritic pain.  Will administer dose of Toradol, as patient has requested and has responded well to it in the past with similar presentation.  Patient evaluated by attending physician at bedside as well, will discharged with few pills of tramadol and steroid burst at home.  No further work-up warranted needed this time given normal  neurovascular status of the arm.  May follow-up with her orthopedic provider in the outpatient setting.  Berneita voiced understanding of her medical evaluation and treatment plan.  Each of her questions was answered to her expressed satisfaction.  Return precautions were given.  Patient is well-appearing, stable, and appropriate for discharge at this time.  This chart was dictated using voice recognition software, Dragon. Despite the best efforts of this provider to proofread and correct errors, errors may still occur which can change documentation meaning.  Final Clinical Impression(s) / ED Diagnoses Final diagnoses:  None    Rx / DC Orders ED Discharge Orders     None        Emeline Darling, PA-C 10/27/20 1527    Lennice Sites, DO 10/27/20 1541

## 2020-11-03 ENCOUNTER — Other Ambulatory Visit: Payer: Self-pay | Admitting: Family Medicine

## 2020-11-03 ENCOUNTER — Telehealth: Payer: PPO

## 2020-11-03 ENCOUNTER — Other Ambulatory Visit (INDEPENDENT_AMBULATORY_CARE_PROVIDER_SITE_OTHER): Payer: PPO

## 2020-11-03 ENCOUNTER — Telehealth: Payer: Self-pay | Admitting: *Deleted

## 2020-11-03 ENCOUNTER — Other Ambulatory Visit: Payer: Self-pay

## 2020-11-03 DIAGNOSIS — E871 Hypo-osmolality and hyponatremia: Secondary | ICD-10-CM

## 2020-11-03 DIAGNOSIS — N1831 Chronic kidney disease, stage 3a: Secondary | ICD-10-CM

## 2020-11-03 LAB — RENAL FUNCTION PANEL
Albumin: 3.9 g/dL (ref 3.5–5.2)
BUN: 21 mg/dL (ref 6–23)
CO2: 31 mEq/L (ref 19–32)
Calcium: 10.3 mg/dL (ref 8.4–10.5)
Chloride: 93 mEq/L — ABNORMAL LOW (ref 96–112)
Creatinine, Ser: 1.18 mg/dL (ref 0.40–1.20)
GFR: 41.4 mL/min — ABNORMAL LOW (ref >60.00)
Glucose, Bld: 103 mg/dL — ABNORMAL HIGH (ref 70–99)
Phosphorus: 3.7 mg/dL (ref 2.3–4.6)
Potassium: 4.6 mEq/L (ref 3.5–5.1)
Sodium: 131 mEq/L — ABNORMAL LOW (ref 135–145)

## 2020-11-03 NOTE — Chronic Care Management (AMB) (Signed)
  Care Management   Note  11/03/2020 Name: Brooke Beasley MRN: [id number] DOB: [birth year]  Brooke Beasley is a 87 y.o. year old female who is a primary care patient of [name], MD and is actively engaged with the care management team. I reached out to [name] by phone today to assist with re-scheduling an initial visit with the RN Case Manager  Follow up plan: Unsuccessful telephone outreach attempt made. A HIPAA compliant phone message was left for the patient providing contact information and requesting a return call.  The care management team will reach out to the patient again over the next 7 days.  If patient returns call to provider office, please advise to call [place] at [phone].  [place] Management

## 2020-11-05 ENCOUNTER — Encounter: Payer: Self-pay | Admitting: Family Medicine

## 2020-11-06 ENCOUNTER — Encounter: Payer: Self-pay | Admitting: Family Medicine

## 2020-11-06 ENCOUNTER — Other Ambulatory Visit: Payer: Self-pay | Admitting: Family Medicine

## 2020-11-06 ENCOUNTER — Other Ambulatory Visit: Payer: PPO

## 2020-11-06 ENCOUNTER — Other Ambulatory Visit: Payer: Self-pay

## 2020-11-06 DIAGNOSIS — E871 Hypo-osmolality and hyponatremia: Secondary | ICD-10-CM | POA: Diagnosis not present

## 2020-11-08 ENCOUNTER — Encounter: Payer: Self-pay | Admitting: Family Medicine

## 2020-11-08 LAB — SODIUM, URINE, RANDOM: Sodium, Ur: 28 mmol/L (ref 28–272)

## 2020-11-08 LAB — OSMOLALITY, URINE: Osmolality, Ur: 371 mOsm/kg (ref 50–1200)

## 2020-11-11 NOTE — Chronic Care Management (AMB) (Signed)
  Care Management   Note  11/11/2020 Name: Liset Mcmonigle MRN: [id number] DOB: Dec 08, 1930  Danaka Llera is a 87 y.o. year old female who is a primary care patient of [name], MD and is actively engaged with the care management team. I reached out to [name] by phone today to assist with re-scheduling an initial visit with the RN Case Manager  Follow up plan: Telephone appointment with care management team member scheduled for:12/05/2020  Ori Trejos  Care Guide, Embedded Care Coordination [place]  Care Management

## 2020-11-13 NOTE — Telephone Encounter (Signed)
See result note.  

## 2020-11-17 ENCOUNTER — Encounter: Payer: Self-pay | Admitting: Family Medicine

## 2020-11-24 ENCOUNTER — Telehealth: Payer: Self-pay

## 2020-11-24 NOTE — Telephone Encounter (Signed)
Prolia VOB initiated via [email or web address]  Last OV: 10/13/20 Next OV: 03/31/21 Last Prolia inj:  Next Prolia inj DUE:

## 2020-12-05 ENCOUNTER — Telehealth: Payer: Self-pay | Admitting: Family Medicine

## 2020-12-05 ENCOUNTER — Telehealth: Payer: PPO

## 2020-12-05 DIAGNOSIS — M19041 Primary osteoarthritis, right hand: Secondary | ICD-10-CM

## 2020-12-05 DIAGNOSIS — M159 Polyosteoarthritis, unspecified: Secondary | ICD-10-CM

## 2020-12-05 DIAGNOSIS — M8949 Other hypertrophic osteoarthropathy, multiple sites: Secondary | ICD-10-CM

## 2020-12-05 DIAGNOSIS — M1120 Other chondrocalcinosis, unspecified site: Secondary | ICD-10-CM

## 2020-12-05 MED ORDER — COLCHICINE 0.6 MG PO TABS: 0.6000 mg | ORAL_TABLET | Freq: Every day | ORAL | 0 refills | Status: DC | PRN

## 2020-12-05 NOTE — Addendum Note (Signed)
Addended by: Magdalen Spatz C on: 12/05/2020 05:55 PM   Modules accepted: Orders

## 2020-12-05 NOTE — Telephone Encounter (Signed)
Mrs. Lucia Gaskins called in wanted to getting a referral for a rheumatologist due to arthritis and gout. And the doctor name mayur patel and the phone number is [phone].  He is located at [place] [facility].   And wanted to know if he could call her some predisone due to her is hurting all over.

## 2020-12-05 NOTE — Telephone Encounter (Signed)
Would start with colchicine for her pseudogout.  I'm hesitant to prescribe steroids in h/o osteoporosis as she's no longer on prolia.  Rheum referral placed.

## 2020-12-05 NOTE — Telephone Encounter (Signed)
Spoke with [name] and gave her all info.  She verbalizes understanding and asks if I will send in refill on patient's colchicine.    Will send in 1 refill on patient's colchicine to [facility].

## 2020-12-08 NOTE — Telephone Encounter (Signed)
Pt ready for scheduling on or after 12/08/20  Out-of-pocket cost due at time of visit: $280  Primary: [employer] Prolia co-insurance: 20% (approximately $255) Admin fee co-insurance: 20% (approximately $25)  Secondary: n/a Prolia co-insurance:  Admin fee co-insurance:   Deductible: does not apply  Prior Auth: not required PA# Valid:

## 2020-12-09 NOTE — Telephone Encounter (Signed)
After reviewing patient's chart found out that patient has declined Prolia-phone note 05/2020 and in [facility] result notes. Dr Darnell Level is aware per notes. Closing this encounter

## 2020-12-10 ENCOUNTER — Encounter: Payer: Self-pay | Admitting: Family Medicine

## 2020-12-16 ENCOUNTER — Telehealth: Payer: Self-pay

## 2020-12-16 ENCOUNTER — Telehealth: Payer: PPO

## 2020-12-16 NOTE — Telephone Encounter (Signed)
  Care Management   Follow Up Note   12/16/2020 Name: Brooke Beasley MRN: [id number] DOB: 04/21/1931   Referred by: [name], MD Reason for referral : Chronic Care Management (Initial assessment)   An unsuccessful telephone outreach was attempted today. The patient was referred to the case management team for assistance with care management and care coordination.   Follow Up Plan: A HIPPA compliant phone message was left for the patient providing contact information and requesting a return call.   Quinn Plowman RN,BSN,CCM RN Case Manager [place]  [phone]

## 2020-12-17 NOTE — Telephone Encounter (Signed)
Pt archived in [email or web address]

## 2020-12-19 ENCOUNTER — Other Ambulatory Visit: Payer: Self-pay

## 2020-12-19 ENCOUNTER — Encounter: Payer: Self-pay | Admitting: Cardiology

## 2020-12-19 ENCOUNTER — Ambulatory Visit: Payer: PPO | Admitting: Cardiology

## 2020-12-19 VITALS — BP 120/62 | HR 73 | Ht 61.0 in | Wt 157.0 lb

## 2020-12-19 DIAGNOSIS — I10 Essential (primary) hypertension: Secondary | ICD-10-CM | POA: Diagnosis not present

## 2020-12-19 DIAGNOSIS — I5189 Other ill-defined heart diseases: Secondary | ICD-10-CM | POA: Diagnosis not present

## 2020-12-19 MED ORDER — IRBESARTAN 150 MG PO TABS: 150.0000 mg | ORAL_TABLET | Freq: Every day | ORAL | 3 refills | Status: DC

## 2020-12-19 NOTE — Patient Instructions (Signed)
Medication Instructions:   DECREASE your Irbesartan to 150 MG once a day.  *If you need a refill on your cardiac medications before your next appointment, please call your pharmacy*   Lab Work: None ordered If you have labs (blood work) drawn today and your tests are completely normal, you will receive your results only by: [place] (if you have MyChart) OR A paper copy in the mail If you have any lab test that is abnormal or we need to change your treatment, we will call you to review the results.   Testing/Procedures: None ordered   Follow-Up: At [facility], you and your health needs are our priority.  As part of our continuing mission to provide you with exceptional heart care, we have created designated Provider Care Teams.  These Care Teams include your primary Cardiologist (physician) and Advanced Practice Providers (APPs -  Physician Assistants and Nurse Practitioners) who all work together to provide you with the care you need, when you need it.  We recommend signing up for the patient portal called "MyChart".  Sign up information is provided on this After Visit Summary.  MyChart is used to connect with patients for Virtual Visits (Telemedicine).  Patients are able to view lab/test results, encounter notes, upcoming appointments, etc.  Non-urgent messages can be sent to your provider as well.   To learn more about what you can do with MyChart, go to [email or web address].    Your next appointment:   3 month(s)  The format for your next appointment:   In Person  Provider:   You may see Dr. Garen Lah or one of the following Advanced Practice Providers on your designated Care Team:   Murray Hodgkins, NP Christell Faith, PA-C Marrianne Mood, PA-C Cadence Kathlen Mody, [place]   Other Instructions

## 2020-12-19 NOTE — Progress Notes (Signed)
Cardiology Office Note:    Date:  12/19/2020   ID:  Brooke Beasley, DOB 20-Sep-1930, MRN [id number]  PCP:  [name], MD  [facility] Cardiologist:  None  [facility] Electrophysiologist:  None   Referring MD: [name], MD   Chief Complaint  Patient presents with   Other    2 month follow up. Patient c.o cehst pain, SOB, and swelling in ankles. Meds reviewed verbally with patient.     History of Present Illness:    Brooke Beasley is a 87 y.o. female with a hx of anxiety, hypertension, hyperlipidemia, who presents for follow-up.  Patient last seen due to elevated blood pressures, and fatigue.  Blood pressure medications were adjusted, Coreg was stopped due to fatigue.  She states her blood pressures have overall improved, and sometimes running low with systolic in the 0000000.  Endorsed feeling tired and fatigued with this.  Also states having lack of energy.  Occasionally has lower extremity swelling and shortness of breath, takes her Lasix for about 3 days with improvement in symptoms.   Prior notes Echocardiogram on 11/26/2019 showed normal systolic function, EF 60 to [id number], grade 2 diastolic dysfunction.  Mild to moderate MR, mild MS.  Patient previously took lisinopril, but stopped due to side effects/cough.  Amlodipine stopped due to abdominal pains  Past Medical History:  Diagnosis Date   Anxiety    C. difficile diarrhea 07/11/2018   S/p multiple oral vanc treatments (course and tapers) and completed Zinplava monoclonal Ab treatment (05/10/2019) Fecal transplant on hold during [place] pandemic   CAP (community acquired pneumonia) 12/22/2017   Carotid stenosis 10/18/2014   R 1-39%, L 40-59%, rpt 1 yr (09/2014)    CKD (chronic kidney disease) stage 3, GFR 30-59 ml/min (HCC) 08/02/2014   Clostridioides difficile infection    hx 2020   Degenerative disc disease    LS   Depression    nervous breakdonw in 1964-out of work for a year   Diverticulosis     severe by colonoscopy   Family history of adverse reaction to anesthesia    n/v   GERD (gastroesophageal reflux disease)    Glaucoma    Heart murmur 5/13   mitral regurge - on echo    History of hiatal hernia    History of shingles    HTN (hypertension)    Hyperlipidemia    Hypertension    Hypothyroidism    IBS 11/02/2006   Intestinal bacterial overgrowth    In small colon   Maxillary fracture ([place]) 04/08/2012   Osteoarthritis 2016   ([facility])   Osteoporosis    dexa 2011   Pseudogout 2016   shoulders ([place])   Pseudogout     Past Surgical History:  Procedure Laterality Date   barium enema  2012   severe diverticulosis, redundant colon   Bowel obstruction  1999   no surgery in hosp x 3 days   COLONOSCOPY  1. 1999  2. 11/04   1. Not finished  2. Slight hemorrhage rectosigmoid area, severe sig diverticulosis   COLONOSCOPY WITH PROPOFOL N/A 09/10/2019   SSP with dysplasia, TA Marius Ditch, Tally Due, MD)   Dexa  1. [phone]   2. 9/04  3. 3/08    1. OP  2. OP, borderline, spine -2.44T  3. decreased BMD-OP   DEXA  2011   OP in spine   DG KNEE 1-2 VIEWS BILAT     LS x-ray with degenerative disc and facet change  ESOPHAGOGASTRODUODENOSCOPY     Negative   Hemorrhoid procedure  4/08   JOINT REPLACEMENT     TONSILLECTOMY     TOTAL SHOULDER ARTHROPLASTY Left 11/27/2015   Corky Mull, MD   TOTAL SHOULDER REVISION Left 03/16/2016   Procedure: TOTAL SHOULDER REVISION;  Surgeon: Corky Mull, MD;  Location: [facility] ORS;  Service: Orthopedics;  Laterality: Left;   US ECHOCARDIOGRAPHY  [id number]   Normal systolic fxn with EF 0000000.  Focal basal septal hypertrophy.  Mild diastolic dysfunction.  Mild MR.    Current Medications: Current Meds  Medication Sig   acetaminophen (TYLENOL) 500 MG tablet Take 500-1,000 mg by mouth at bedtime. Depends on pain   albuterol (PROVENTIL HFA;VENTOLIN HFA) 108 (90 Base) MCG/ACT inhaler Inhale 2 puffs into the lungs every 6 (six) hours as needed for  wheezing or shortness of breath.   bimatoprost (LUMIGAN) 0.01 % SOLN Place 1 drop into both eyes at bedtime.   Bioflavonoid Products (VITAMIN C) CHEW Chew 1 tablet by mouth daily.   cholecalciferol (VITAMIN D) 1000 units tablet Take 1,000 Units by mouth daily.   clonazePAM (KLONOPIN) 0.5 MG tablet TAKE 1 TABLET BY MOUTH AT BEDTIME   colchicine 0.6 MG tablet Take 1 tablet (0.6 mg total) by mouth daily as needed (gout flare).   denosumab (PROLIA) 60 MG/ML SOSY injection Inject 60 mg into the skin every 6 (six) months.   EUTHYROX 100 MCG tablet Take 1 tablet by mouth once daily   fish oil-omega-3 fatty acids 1000 MG capsule Take 1 g by mouth daily.    furosemide (LASIX) 20 MG tablet Take 1 tablet (20 mg total) by mouth as needed.   hydrocortisone (ANUSOL-HC) 2.5 % rectal cream Place 1 application rectally 2 (two) times daily.   hydrocortisone 2.5 % cream Place rectally.   methocarbamol (ROBAXIN) 500 MG tablet Take 1 tablet (500 mg total) by mouth every 8 (eight) hours as needed for muscle spasms.   Misc Natural Products (OSTEO BI-FLEX TRIPLE STRENGTH PO) Take 1 tablet by mouth daily. With Vit D   Mouthwash Compounding Base (MOUTH WASH-GP) LIQD Take 5 mLs by mouth every 6 (six) hours as needed.   Multiple Vitamins-Minerals (PRESERVISION AREDS 2+MULTI VIT) CAPS Take 1 capsule by mouth daily.   omeprazole (PRILOSEC) 20 MG capsule Take 1 capsule (20 mg total) by mouth daily.   ondansetron (ZOFRAN) 4 MG tablet Take 1 tablet (4 mg total) by mouth every 8 (eight) hours as needed for nausea or vomiting.   Plecanatide (TRULANCE) 3 MG TABS Take 1 tablet by mouth daily.   promethazine-phenylephrine (PROMETHAZINE VC) 6.25-5 MG/5ML SYRP Take 5 mLs by mouth every 4 (four) hours as needed for congestion.   psyllium (METAMUCIL) 58.6 % packet Take by mouth.   sertraline (ZOLOFT) 100 MG tablet Take 0.5 tablets (50 mg total) by mouth daily.   Simethicone 180 MG CAPS Take by mouth.   spironolactone (ALDACTONE) 25 MG  tablet Take 1 tablet (25 mg total) by mouth daily.   TURMERIC PO Take 1 tablet by mouth daily.   VITAMIN E PO Take 1 tablet by mouth daily.   VOLTAREN 1 % GEL APPLY ONE APPLICATION TOPICALLY 3 TIMES DAILY   [DISCONTINUED] irbesartan (AVAPRO) 300 MG tablet Take 1 tablet (300 mg total) by mouth daily.     Allergies:   Amlodipine, Prednisone, Ace inhibitors, Alendronate sodium, Carvedilol, Losartan, Other, Paroxetine hcl, Risedronate sodium, Silenor [doxepin hcl], Simvastatin, Sulfonamide derivatives, Toprol xl [metoprolol], Tramadol, and Influenza vaccines  Social History   Socioeconomic History   Marital status: Married    Spouse name: Not on file   Number of children: 2   Years of education: Not on file   Highest education level: Not on file  Occupational History   Occupation: Retired    Fish farm manager: RETIRED  Tobacco Use   Smoking status: Never   Smokeless tobacco: Never  Vaping Use   Vaping Use: Never used  Substance and Sexual Activity   Alcohol use: No   Drug use: No   Sexual activity: Not Currently  Other Topics Concern   Not on file  Social History Narrative   Left handed   Widow. Husband ([name]) deceased 04-07-16 from dementia. She was caregiver.    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  ROS:   Please see the history of present illness.     All other systems reviewed and are negative.  EKGs/Labs/Other Studies Reviewed:     The following studies were reviewed today:   EKG:  EKG not ordered today.    Recent Labs: 03/17/2020: ALT 14; Hemoglobin 12.9; Platelets 213; TSH 0.622 11/03/2020: BUN 21; Creatinine, Ser 1.18; Potassium 4.6; Sodium 131  Recent Lipid Panel    Component Value Date/Time   CHOL 186 03/17/2020 1519   TRIG 137 03/17/2020 1519   HDL 52 03/17/2020 1519   CHOLHDL 3.6 03/17/2020 1519   VLDL 27 03/17/2020 1519   LDLCALC 107 (H) 03/17/2020 1519   LDLDIRECT 86.0 09/19/2014 1049    Physical Exam:    VS:  BP 120/62 (BP Location: Left Arm, Patient Position: Sitting, Cuff Size: Normal)   Pulse 73   Ht '5\' 1"'$  (1.549 m)   Wt 157 lb (71.2 kg)   SpO2 99%   BMI 29.66 kg/m     Wt Readings from Last 3 Encounters:  12/19/20 157 lb (71.2 kg)  10/27/20 160 lb (72.6 kg)  10/13/20 160 lb (72.6 kg)     GEN:  Well nourished, well developed in no acute distress HEENT: Normal NECK: No JVD; No carotid bruits LYMPHATICS: No lymphadenopathy CARDIAC: RRR, 3/6 systolic murmur, rubs, gallops RESPIRATORY:  Clear to auscultation without rales, wheezing or rhonchi  ABDOMEN: Soft, non-tender, non-distended MUSCULOSKELETAL: Trace edema; No deformity  SKIN: Warm and dry NEUROLOGIC:  Alert and oriented x 3 PSYCHIATRIC:  Normal affect   ASSESSMENT:    1. Primary hypertension   2. Diastolic dysfunction     PLAN:    In order of problems listed above:  Hypertension, BP controlled.  Patient endorses occasional fatigue, BP running low.  Reduce irbesartan to 150 mg daily.  Continue Aldactone. Grade 2 diastolic dysfunction.  Continue Aldactone, take Lasix as needed.  Follow-up in 3 months.   Medication Adjustments/Labs and Tests Ordered: Current medicines are reviewed at length with the patient today.  Concerns regarding medicines are outlined above.  No orders of the defined types were placed in  this encounter.  Meds ordered this encounter  Medications   irbesartan (AVAPRO) 150 MG tablet    Sig:  Take 1 tablet (150 mg total) by mouth daily.    Dispense:  30 tablet    Refill:  3    No need to refill at this time. Patient will cut in half her 300 MG tablets until she runs out.     Patient Instructions  Medication Instructions:   DECREASE your Irbesartan to 150 MG once a day.  *If you need a refill on your cardiac medications before your next appointment, please call your pharmacy*   Lab Work: None ordered If you have labs (blood work) drawn today and your tests are completely normal, you will receive your results only by: [place] (if you have MyChart) OR A paper copy in the mail If you have any lab test that is abnormal or we need to change your treatment, we will call you to review the results.   Testing/Procedures: None ordered   Follow-Up: At [facility], you and your health needs are our priority.  As part of our continuing mission to provide you with exceptional heart care, we have created designated Provider Care Teams.  These Care Teams include your primary Cardiologist (physician) and Advanced Practice Providers (APPs -  Physician Assistants and Nurse Practitioners) who all work together to provide you with the care you need, when you need it.  We recommend signing up for the patient portal called "MyChart".  Sign up information is provided on this After Visit Summary.  MyChart is used to connect with patients for Virtual Visits (Telemedicine).  Patients are able to view lab/test results, encounter notes, upcoming appointments, etc.  Non-urgent messages can be sent to your provider as well.   To learn more about what you can do with MyChart, go to [email or web address].    Your next appointment:   3 month(s)  The format for your next appointment:   In Person  Provider:   You may see Dr. Garen Lah or one of the following Advanced Practice Providers on your designated Care Team:   Murray Hodgkins, NP Christell Faith, PA-C Marrianne Mood,  PA-C Cadence Eton, [place]   Other Instructions     Signed, Kate Sable, MD  12/19/2020 4:52 PM    [place]

## 2020-12-25 DIAGNOSIS — K922 Gastrointestinal hemorrhage, unspecified: Secondary | ICD-10-CM

## 2020-12-25 HISTORY — DX: Gastrointestinal hemorrhage, unspecified: K92.2

## 2020-12-29 ENCOUNTER — Encounter: Payer: Self-pay | Admitting: Family Medicine

## 2021-01-05 ENCOUNTER — Ambulatory Visit: Payer: PPO

## 2021-01-05 NOTE — Chronic Care Management (AMB) (Signed)
  Care Management   Outreach Note  01/05/2021 Name: Brooke Beasley MRN: [id number] DOB: May 13, 1930  Referred by: [name], MD Reason for referral : Chronic Care Management (Telephone follow up)   Successful contact was made with the patient to discuss care management and care coordination services. Patient declines engagement at this time.   Follow Up Plan:  The patient has been provided with contact information for the care management team and has been advised to call with any health related questions or concerns.   Quinn Plowman RN,BSN,CCM RN Case Manager [place]  [phone]

## 2021-01-05 NOTE — Patient Instructions (Signed)
Visit Information  Thank you for allowing me to share the care management and care coordination services that are available to you as part of your health plan and services through your primary care provider and medical home. Please reach out to me at [phone]  if the care management/care coordination team may be of assistance to you in the future.   Shaelynn Dragos RN,BSN,CCM RN Case Manager [place]  [phone]  

## 2021-01-13 ENCOUNTER — Encounter: Payer: Self-pay | Admitting: Gastroenterology

## 2021-01-14 ENCOUNTER — Inpatient Hospital Stay (HOSPITAL_COMMUNITY)
Admission: EM | Admit: 2021-01-14 | Discharge: 2021-01-16 | DRG: 378 | Disposition: A | Discharge status: Home Health | Payer: PPO | Attending: Internal Medicine | Admitting: Internal Medicine

## 2021-01-14 ENCOUNTER — Other Ambulatory Visit: Payer: Self-pay

## 2021-01-14 ENCOUNTER — Emergency Department (HOSPITAL_COMMUNITY): Payer: PPO

## 2021-01-14 ENCOUNTER — Encounter (HOSPITAL_COMMUNITY): Payer: Self-pay

## 2021-01-14 DIAGNOSIS — H409 Unspecified glaucoma: Secondary | ICD-10-CM | POA: Diagnosis present

## 2021-01-14 DIAGNOSIS — E785 Hyperlipidemia, unspecified: Secondary | ICD-10-CM | POA: Diagnosis present

## 2021-01-14 DIAGNOSIS — Z8619 Personal history of other infectious and parasitic diseases: Secondary | ICD-10-CM | POA: Diagnosis not present

## 2021-01-14 DIAGNOSIS — Z86010 Personal history of colonic polyps: Secondary | ICD-10-CM | POA: Diagnosis not present

## 2021-01-14 DIAGNOSIS — I6529 Occlusion and stenosis of unspecified carotid artery: Secondary | ICD-10-CM | POA: Diagnosis not present

## 2021-01-14 DIAGNOSIS — N1832 Chronic kidney disease, stage 3b: Secondary | ICD-10-CM | POA: Diagnosis present

## 2021-01-14 DIAGNOSIS — Z96612 Presence of left artificial shoulder joint: Secondary | ICD-10-CM | POA: Diagnosis present

## 2021-01-14 DIAGNOSIS — Z803 Family history of malignant neoplasm of breast: Secondary | ICD-10-CM

## 2021-01-14 DIAGNOSIS — R7303 Prediabetes: Secondary | ICD-10-CM | POA: Diagnosis present

## 2021-01-14 DIAGNOSIS — K5731 Diverticulosis of large intestine without perforation or abscess with bleeding: Secondary | ICD-10-CM | POA: Diagnosis not present

## 2021-01-14 DIAGNOSIS — R739 Hyperglycemia, unspecified: Secondary | ICD-10-CM | POA: Diagnosis present

## 2021-01-14 DIAGNOSIS — K648 Other hemorrhoids: Secondary | ICD-10-CM | POA: Diagnosis present

## 2021-01-14 DIAGNOSIS — Z885 Allergy status to narcotic agent status: Secondary | ICD-10-CM

## 2021-01-14 DIAGNOSIS — K625 Hemorrhage of anus and rectum: Secondary | ICD-10-CM | POA: Diagnosis not present

## 2021-01-14 DIAGNOSIS — Y92009 Unspecified place in unspecified non-institutional (private) residence as the place of occurrence of the external cause: Secondary | ICD-10-CM

## 2021-01-14 DIAGNOSIS — K922 Gastrointestinal hemorrhage, unspecified: Secondary | ICD-10-CM | POA: Diagnosis present

## 2021-01-14 DIAGNOSIS — S0285XA Fracture of orbit, unspecified, initial encounter for closed fracture: Secondary | ICD-10-CM | POA: Diagnosis present

## 2021-01-14 DIAGNOSIS — Z833 Family history of diabetes mellitus: Secondary | ICD-10-CM | POA: Diagnosis not present

## 2021-01-14 DIAGNOSIS — M2578 Osteophyte, vertebrae: Secondary | ICD-10-CM | POA: Diagnosis not present

## 2021-01-14 DIAGNOSIS — Z79899 Other long term (current) drug therapy: Secondary | ICD-10-CM

## 2021-01-14 DIAGNOSIS — G238 Other specified degenerative diseases of basal ganglia: Secondary | ICD-10-CM | POA: Diagnosis not present

## 2021-01-14 DIAGNOSIS — K581 Irritable bowel syndrome with constipation: Secondary | ICD-10-CM | POA: Diagnosis not present

## 2021-01-14 DIAGNOSIS — M810 Age-related osteoporosis without current pathological fracture: Secondary | ICD-10-CM | POA: Diagnosis present

## 2021-01-14 DIAGNOSIS — F32A Depression, unspecified: Secondary | ICD-10-CM | POA: Diagnosis present

## 2021-01-14 DIAGNOSIS — Z888 Allergy status to other drugs, medicaments and biological substances status: Secondary | ICD-10-CM

## 2021-01-14 DIAGNOSIS — R109 Unspecified abdominal pain: Secondary | ICD-10-CM | POA: Diagnosis not present

## 2021-01-14 DIAGNOSIS — E871 Hypo-osmolality and hyponatremia: Secondary | ICD-10-CM | POA: Diagnosis not present

## 2021-01-14 DIAGNOSIS — S199XXA Unspecified injury of neck, initial encounter: Secondary | ICD-10-CM | POA: Diagnosis not present

## 2021-01-14 DIAGNOSIS — W19XXXA Unspecified fall, initial encounter: Secondary | ICD-10-CM | POA: Diagnosis not present

## 2021-01-14 DIAGNOSIS — H9190 Unspecified hearing loss, unspecified ear: Secondary | ICD-10-CM | POA: Diagnosis present

## 2021-01-14 DIAGNOSIS — Z8249 Family history of ischemic heart disease and other diseases of the circulatory system: Secondary | ICD-10-CM | POA: Diagnosis not present

## 2021-01-14 DIAGNOSIS — F419 Anxiety disorder, unspecified: Secondary | ICD-10-CM | POA: Diagnosis not present

## 2021-01-14 DIAGNOSIS — S0240CA Maxillary fracture, right side, initial encounter for closed fracture: Secondary | ICD-10-CM | POA: Diagnosis present

## 2021-01-14 DIAGNOSIS — K219 Gastro-esophageal reflux disease without esophagitis: Secondary | ICD-10-CM | POA: Diagnosis present

## 2021-01-14 DIAGNOSIS — D62 Acute posthemorrhagic anemia: Secondary | ICD-10-CM | POA: Diagnosis not present

## 2021-01-14 DIAGNOSIS — E039 Hypothyroidism, unspecified: Secondary | ICD-10-CM | POA: Diagnosis present

## 2021-01-14 DIAGNOSIS — K6289 Other specified diseases of anus and rectum: Secondary | ICD-10-CM | POA: Diagnosis not present

## 2021-01-14 DIAGNOSIS — I129 Hypertensive chronic kidney disease with stage 1 through stage 4 chronic kidney disease, or unspecified chronic kidney disease: Secondary | ICD-10-CM | POA: Diagnosis present

## 2021-01-14 DIAGNOSIS — I672 Cerebral atherosclerosis: Secondary | ICD-10-CM | POA: Diagnosis not present

## 2021-01-14 DIAGNOSIS — Z20822 Contact with and (suspected) exposure to covid-19: Secondary | ICD-10-CM | POA: Diagnosis present

## 2021-01-14 DIAGNOSIS — Z79890 Hormone replacement therapy (postmenopausal): Secondary | ICD-10-CM

## 2021-01-14 DIAGNOSIS — M4312 Spondylolisthesis, cervical region: Secondary | ICD-10-CM | POA: Diagnosis not present

## 2021-01-14 DIAGNOSIS — R55 Syncope and collapse: Secondary | ICD-10-CM | POA: Diagnosis present

## 2021-01-14 DIAGNOSIS — E861 Hypovolemia: Secondary | ICD-10-CM | POA: Diagnosis present

## 2021-01-14 DIAGNOSIS — H1131 Conjunctival hemorrhage, right eye: Secondary | ICD-10-CM | POA: Diagnosis present

## 2021-01-14 DIAGNOSIS — Z8616 Personal history of COVID-19: Secondary | ICD-10-CM | POA: Diagnosis not present

## 2021-01-14 DIAGNOSIS — I6200 Nontraumatic subdural hemorrhage, unspecified: Secondary | ICD-10-CM | POA: Diagnosis not present

## 2021-01-14 DIAGNOSIS — S0231XA Fracture of orbital floor, right side, initial encounter for closed fracture: Secondary | ICD-10-CM | POA: Diagnosis not present

## 2021-01-14 DIAGNOSIS — Z882 Allergy status to sulfonamides status: Secondary | ICD-10-CM

## 2021-01-14 LAB — COMPREHENSIVE METABOLIC PANEL
ALT: 14 U/L (ref 0–44)
AST: 19 U/L (ref 15–41)
Albumin: 3.3 g/dL — ABNORMAL LOW (ref 3.5–5.0)
Alkaline Phosphatase: 75 U/L (ref 38–126)
Anion gap: 9 (ref 5–15)
BUN: 12 mg/dL (ref 8–23)
CO2: 25 mmol/L (ref 22–32)
Calcium: 9.3 mg/dL (ref 8.9–10.3)
Chloride: 96 mmol/L — ABNORMAL LOW (ref 98–111)
Creatinine, Ser: 1.02 mg/dL — ABNORMAL HIGH (ref 0.44–1.00)
GFR, Estimated: 53 mL/min — ABNORMAL LOW (ref >60)
Glucose, Bld: 144 mg/dL — ABNORMAL HIGH (ref 70–99)
Potassium: 4 mmol/L (ref 3.5–5.1)
Sodium: 130 mmol/L — ABNORMAL LOW (ref 135–145)
Total Bilirubin: 0.6 mg/dL (ref 0.3–1.2)
Total Protein: 6.1 g/dL — ABNORMAL LOW (ref 6.5–8.1)

## 2021-01-14 LAB — CBC
HCT: 26 % — ABNORMAL LOW (ref 36.0–46.0)
Hemoglobin: 8.3 g/dL — ABNORMAL LOW (ref 12.0–15.0)
MCH: 27.4 pg (ref 26.0–34.0)
MCHC: 31.9 g/dL (ref 30.0–36.0)
MCV: 85.8 fL (ref 80.0–100.0)
Platelets: 267 10*3/uL (ref 150–400)
RBC: 3.03 MIL/uL — ABNORMAL LOW (ref 3.87–5.11)
RDW: 15.6 % — ABNORMAL HIGH (ref 11.5–15.5)
WBC: 7.3 10*3/uL (ref 4.0–10.5)
nRBC: 0 % (ref 0.0–0.2)

## 2021-01-14 LAB — RESP PANEL BY RT-PCR (FLU A&B, COVID) ARPGX2
Influenza A by PCR: NEGATIVE
Influenza B by PCR: NEGATIVE
SARS Coronavirus 2 by RT PCR: NEGATIVE

## 2021-01-14 LAB — HEMOGLOBIN AND HEMATOCRIT, BLOOD
HCT: 23 % — ABNORMAL LOW (ref 36.0–46.0)
Hemoglobin: 7.4 g/dL — ABNORMAL LOW (ref 12.0–15.0)

## 2021-01-14 LAB — POC OCCULT BLOOD, ED: Fecal Occult Bld: POSITIVE — AB

## 2021-01-14 MED ORDER — COLCHICINE 0.6 MG PO TABS
0.6000 mg | ORAL_TABLET | Freq: Every day | ORAL | Status: DC | PRN
  Filled 2021-01-14: qty 1

## 2021-01-14 MED ORDER — SPIRONOLACTONE 25 MG PO TABS: 25.0000 mg | ORAL_TABLET | Freq: Every day | ORAL | Status: DC

## 2021-01-14 MED ORDER — SERTRALINE HCL 100 MG PO TABS
100.0000 mg | ORAL_TABLET | Freq: Every morning | ORAL | Status: DC
  Administered 2021-01-15 – 2021-01-16 (×2): 100 mg via ORAL
  Filled 2021-01-14 (×2): qty 1

## 2021-01-14 MED ORDER — INSULIN ASPART 100 UNIT/ML IJ SOLN: 0.0000 [IU] | Freq: Three times a day (TID) | INTRAMUSCULAR | Status: DC

## 2021-01-14 MED ORDER — ONDANSETRON HCL 4 MG/2ML IJ SOLN
4.0000 mg | Freq: Four times a day (QID) | INTRAMUSCULAR | Status: DC | PRN
Start: 2021-01-14 — End: 2021-01-16

## 2021-01-14 MED ORDER — CLONAZEPAM 0.25 MG PO TBDP
0.2500 mg | ORAL_TABLET | Freq: Every day | ORAL | Status: DC
  Administered 2021-01-14 – 2021-01-15 (×2): 0.25 mg via ORAL
  Filled 2021-01-14 (×3): qty 1

## 2021-01-14 MED ORDER — SPIRONOLACTONE 25 MG PO TABS
25.0000 mg | ORAL_TABLET | Freq: Every day | ORAL | Status: DC
  Administered 2021-01-15 – 2021-01-16 (×2): 25 mg via ORAL
  Filled 2021-01-14 (×3): qty 1

## 2021-01-14 MED ORDER — SENNOSIDES-DOCUSATE SODIUM 8.6-50 MG PO TABS: 2.0000 | ORAL_TABLET | Freq: Every evening | ORAL | Status: DC | PRN

## 2021-01-14 MED ORDER — INSULIN ASPART 100 UNIT/ML IJ SOLN: 0.0000 [IU] | Freq: Every day | INTRAMUSCULAR | Status: DC

## 2021-01-14 MED ORDER — SODIUM CHLORIDE 0.9 % IV SOLN: INTRAVENOUS | Status: DC

## 2021-01-14 MED ORDER — CEPHALEXIN 250 MG PO CAPS
500.0000 mg | ORAL_CAPSULE | Freq: Once | ORAL | Status: AC
  Administered 2021-01-14: 500 mg via ORAL
  Filled 2021-01-14: qty 2

## 2021-01-14 MED ORDER — OXYCODONE HCL 5 MG PO TABS: 5.0000 mg | ORAL_TABLET | Freq: Four times a day (QID) | ORAL | Status: DC | PRN

## 2021-01-14 MED ORDER — MELATONIN 3 MG PO TABS: 3.0000 mg | ORAL_TABLET | Freq: Every evening | ORAL | Status: DC | PRN

## 2021-01-14 MED ORDER — ACETAMINOPHEN 325 MG PO TABS
650.0000 mg | ORAL_TABLET | Freq: Four times a day (QID) | ORAL | Status: DC | PRN
  Administered 2021-01-14: 650 mg via ORAL
  Filled 2021-01-14: qty 2

## 2021-01-14 MED ORDER — LEVOTHYROXINE SODIUM 100 MCG PO TABS
100.0000 ug | ORAL_TABLET | Freq: Every day | ORAL | Status: DC
  Administered 2021-01-15 – 2021-01-16 (×2): 100 ug via ORAL
  Filled 2021-01-14 (×2): qty 1

## 2021-01-14 MED ORDER — HYDRALAZINE HCL 20 MG/ML IJ SOLN
5.0000 mg | Freq: Four times a day (QID) | INTRAMUSCULAR | Status: DC | PRN
  Administered 2021-01-14: 5 mg via INTRAVENOUS
  Filled 2021-01-14: qty 1

## 2021-01-14 MED ORDER — VITAMIN D 25 MCG (1000 UNIT) PO TABS
1000.0000 [IU] | ORAL_TABLET | Freq: Every day | ORAL | Status: DC
  Administered 2021-01-14 – 2021-01-16 (×3): 1000 [IU] via ORAL
  Filled 2021-01-14 (×4): qty 1

## 2021-01-14 MED ORDER — HYDROMORPHONE HCL 1 MG/ML IJ SOLN: 0.5000 mg | INTRAMUSCULAR | Status: DC | PRN

## 2021-01-14 MED ORDER — IRBESARTAN 150 MG PO TABS
150.0000 mg | ORAL_TABLET | Freq: Every day | ORAL | Status: DC
  Administered 2021-01-15 – 2021-01-16 (×2): 150 mg via ORAL
  Filled 2021-01-14 (×3): qty 1

## 2021-01-14 MED ORDER — AMOXICILLIN-POT CLAVULANATE 875-125 MG PO TABS
1.0000 | ORAL_TABLET | Freq: Two times a day (BID) | ORAL | Status: DC
  Administered 2021-01-14 – 2021-01-16 (×4): 1 via ORAL
  Filled 2021-01-14 (×5): qty 1

## 2021-01-14 MED ORDER — IRBESARTAN 150 MG PO TABS: 150.0000 mg | ORAL_TABLET | Freq: Every day | ORAL | Status: DC

## 2021-01-14 NOTE — ED Provider Notes (Signed)
[facility] EMERGENCY DEPARTMENT Provider Note   CSN: [id number] Arrival date & time: 01/14/21  8657     History Chief Complaint  Patient presents with   Weakness   Rectal Bleeding    Brooke Beasley is a 87 y.o. female.   Weakness Associated symptoms: hematochezia   Rectal Bleeding  This patient is a very pleasant 87 year old female presenting in the care of her daughter from home, she has a history of several medical problems including hypertension, hyperlipidemia, rectal prolapse and a possible history of diverticulosis.  Unfortunately the patient struggled with constipation severely over the last week and yesterday was prescribed a gallon of GoLytely to drink last night which was completed, she had a lot of liquid stools throughout the night, there was a lot of bleeding that she saw in her stools in fact she has had some rectal bleeding over the last couple of weeks and ultimately had a syncopal episode on the bathroom floor after having a large bowel movement which was mostly watery stool with bright red blood.  The patient came around on the floor had injured the right side of her periorbital tissue and face, since that time the patient has been awake and alert but generally weak.  The gastroenterologist recommended that she come to the hospital immediately for evaluation and possible stabilizing care given that this could be diverticulosis bleeding.  The patient denies abdominal pain she is not nauseated vomiting and has minimal headache.  She has been in the waiting room for multiple hours waiting for a bed to come up and of the back.  Noticeable drop in her hemoglobin from approximately 12 to approximately 8 has occurred which I must admit is likely acute given the patient's acute symptomatic anemia.  Past Medical History:  Diagnosis Date   Anxiety    C. difficile diarrhea 07/11/2018   S/p multiple oral vanc treatments (course and tapers) and completed  Zinplava monoclonal Ab treatment (05/10/2019) Fecal transplant on hold during COVID pandemic   CAP (community acquired pneumonia) 12/22/2017   Carotid stenosis 10/18/2014   R 1-39%, L 40-59%, rpt 1 yr (09/2014)    CKD (chronic kidney disease) stage 3, GFR 30-59 ml/min (HCC) 08/02/2014   Clostridioides difficile infection    hx 2020   Degenerative disc disease    LS   Depression    nervous breakdonw in 1964-out of work for a year   Diverticulosis    severe by colonoscopy   Family history of adverse reaction to anesthesia    n/v   GERD (gastroesophageal reflux disease)    Glaucoma    Heart murmur 5/13   mitral regurge - on echo    History of hiatal hernia    History of shingles    HTN (hypertension)    Hyperlipidemia    Hypertension    Hypothyroidism    IBS 11/02/2006   Intestinal bacterial overgrowth    In small colon   Maxillary fracture ([place]) 04/08/2012   Osteoarthritis 2016   ([facility])   Osteoporosis    dexa 2011   Pseudogout 2016   shoulders ([place])   Pseudogout     Patient Active Problem List   Diagnosis Date Noted   COVID-19 virus infection 10/13/2020   Macular degeneration 12/22/2019   Rectal prolapse 10/30/2019   PAC (premature atrial contraction) 07/09/2019   Injury of right knee 05/22/2019   Pedal edema 12/29/2016   Weakness 04/09/2016   Complete tear of left rotator cuff 02/13/2016  Thumb pain, left 12/22/2015   Status post total shoulder replacement 11/27/2015   Health maintenance examination 10/07/2015   Pseudogout    Primary osteoarthritis of right shoulder 11/21/2014   Left shoulder pain 11/04/2014   Carotid stenosis 10/18/2014   Advanced care planning/counseling discussion 09/26/2014   Diverticulosis of colon without hemorrhage 09/26/2014   CKD (chronic kidney disease) stage 3, GFR 30-59 ml/min (HCC) 08/02/2014   Restless legs 07/04/2014   Hyponatremia 07/04/2014   Dizziness 06/13/2014   Chronic abdominal pain 01/07/2014   Primary osteoarthritis  of both hands 11/21/2013   Insomnia 10/23/2013   Medicare annual wellness visit, subsequent 06/08/2013   GERD (gastroesophageal reflux disease) 04/02/2013   Anxiety associated with depression 12/26/2012   Prediabetes 03/20/2012   Valvular heart disease 09/13/2011   Syncope 08/02/2011   Hypertension, essential 02/24/2007   Hypothyroidism 11/02/2006   HLD (hyperlipidemia) 11/02/2006   Glaucoma 11/02/2006   Internal hemorrhoids 11/02/2006   Chronic constipation with overflow incontinence 11/02/2006   Irritable bowel syndrome with constipation 11/02/2006   Osteoporosis 11/02/2006   Osteoarthritis 10/20/2006    Past Surgical History:  Procedure Laterality Date   barium enema  2012   severe diverticulosis, redundant colon   Bowel obstruction  1999   no surgery in hosp x 3 days   COLONOSCOPY  1. 1999  2. 11/04   1. Not finished  2. Slight hemorrhage rectosigmoid area, severe sig diverticulosis   COLONOSCOPY WITH PROPOFOL N/A 09/10/2019   SSP with dysplasia, TA, rpt 3 yrs Marius Ditch, Tally Due, MD)   Dexa  1. [phone]   2. 9/04  3. 3/08    1. OP  2. OP, borderline, spine -2.44T  3. decreased BMD-OP   DEXA  2011   OP in spine   DG KNEE 1-2 VIEWS BILAT     LS x-ray with degenerative disc and facet change   ESOPHAGOGASTRODUODENOSCOPY     Negative   Hemorrhoid procedure  07/2006   JOINT REPLACEMENT     TONSILLECTOMY     TOTAL SHOULDER ARTHROPLASTY Left 11/27/2015   Corky Mull, MD   TOTAL SHOULDER REVISION Left 03/16/2016   Procedure: TOTAL SHOULDER REVISION;  Surgeon: Corky Mull, MD;  Location: [facility] ORS;  Service: Orthopedics;  Laterality: Left;   US ECHOCARDIOGRAPHY  [id number]   Normal systolic fxn with EF 45-80%.  Focal basal septal hypertrophy.  Mild diastolic dysfunction.  Mild MR.     OB History   No obstetric history on file.     Family History  Problem Relation Age of Onset   Diabetes Brother        post-op   Coronary artery disease Brother    Diabetes Paternal  Grandfather    Breast cancer Cousin     Social History   Tobacco Use   Smoking status: Never   Smokeless tobacco: Never  Vaping Use   Vaping Use: Never used  Substance Use Topics   Alcohol use: No   Drug use: No    Home Medications Prior to Admission medications   Medication Sig Start Date End Date Taking? Authorizing Provider  acetaminophen (TYLENOL) 500 MG tablet Take 500-1,000 mg by mouth at bedtime. Depends on pain    [provider]  albuterol (PROVENTIL HFA;VENTOLIN HFA) 108 (90 Base) MCG/ACT inhaler Inhale 2 puffs into the lungs every 6 (six) hours as needed for wheezing or shortness of breath. 01/12/18   [name], MD  bimatoprost (LUMIGAN) 0.01 % SOLN Place 1 drop into both  eyes at bedtime.    [provider]  Bioflavonoid Products (VITAMIN C) CHEW Chew 1 tablet by mouth daily.    [provider]  cholecalciferol (VITAMIN D) 1000 units tablet Take 1,000 Units by mouth daily.    [provider]  clonazePAM (KLONOPIN) 0.5 MG tablet TAKE 1 TABLET BY MOUTH AT BEDTIME 09/21/20   [name], MD  colchicine 0.6 MG tablet Take 1 tablet (0.6 mg total) by mouth daily as needed (gout flare). 12/05/20   [name], MD  denosumab [facility]) 60 MG/ML SOSY injection Inject 60 mg into the skin every 6 (six) months. 05/06/20   [name], MD  EUTHYROX 100 MCG tablet Take 1 tablet by mouth once daily 08/04/20   [name], MD  fish oil-omega-3 fatty acids 1000 MG capsule Take 1 g by mouth daily.     [provider]  furosemide (LASIX) 20 MG tablet Take 1 tablet (20 mg total) by mouth as needed. 03/03/20   Kate Sable, MD  hydrocortisone (ANUSOL-HC) 2.5 % rectal cream Place 1 application rectally 2 (two) times daily. 10/23/18   [name], MD  hydrocortisone 2.5 % cream Place rectally. 10/23/18   [provider]  irbesartan (AVAPRO) 150 MG tablet Take 1 tablet (150 mg total) by mouth daily. 12/19/20    Kate Sable, MD  methocarbamol (ROBAXIN) 500 MG tablet Take 1 tablet (500 mg total) by mouth every 8 (eight) hours as needed for muscle spasms. 03/18/19   Davonna Belling, MD  Misc Natural Products (OSTEO BI-FLEX TRIPLE STRENGTH PO) Take 1 tablet by mouth daily. With Vit D    [provider]  Mouthwash Compounding Base (MOUTH WASH-GP) LIQD Take 5 mLs by mouth every 6 (six) hours as needed. 09/15/20   Lin Landsman, MD  Multiple Vitamins-Minerals (PRESERVISION AREDS 2+MULTI VIT) CAPS Take 1 capsule by mouth daily. 12/22/19   [name], MD  omeprazole (PRILOSEC) 20 MG capsule Take 1 capsule (20 mg total) by mouth daily. 10/02/20   [name], MD  ondansetron (ZOFRAN) 4 MG tablet Take 1 tablet (4 mg total) by mouth every 8 (eight) hours as needed for nausea or vomiting. 10/13/20   [name], MD  Plecanatide (TRULANCE) 3 MG TABS Take 1 tablet by mouth daily. 07/21/20   Lin Landsman, MD  promethazine-phenylephrine (PROMETHAZINE VC) 6.25-5 MG/5ML SYRP Take 5 mLs by mouth every 4 (four) hours as needed for congestion. 09/18/19   Faustino Congress, NP  psyllium (METAMUCIL) 58.6 % packet Take by mouth.    [provider]  sertraline (ZOLOFT) 100 MG tablet Take 0.5 tablets (50 mg total) by mouth daily. 10/05/20   [name], MD  Simethicone 180 MG CAPS Take by mouth.    [provider]  spironolactone (ALDACTONE) 25 MG tablet Take 1 tablet (25 mg total) by mouth daily. 09/29/20   [name], MD  TURMERIC PO Take 1 tablet by mouth daily.    [provider]  VITAMIN E PO Take 1 tablet by mouth daily.    [provider]  VOLTAREN 1 % GEL APPLY ONE APPLICATION TOPICALLY 3 TIMES DAILY 09/26/14   [name], MD    Allergies    Amlodipine, Prednisone, Ace inhibitors, Alendronate sodium, Carvedilol, Losartan, Other, Paroxetine hcl, Risedronate sodium, Silenor [doxepin hcl], Simvastatin, Sulfonamide derivatives,  Toprol xl [metoprolol], Tramadol, and Influenza vaccines  Review of Systems   Review of Systems  Gastrointestinal:  Positive for hematochezia.  Neurological:  Positive for weakness.  All  other systems reviewed and are negative.  Physical Exam Updated Vital Signs BP (!) 173/90 (BP Location: Right Arm)   Pulse 80   Temp 99.3 F (37.4 C) (Oral)   Resp 20   Ht 1.549 m (5\' 1" )   Wt 72.6 kg   SpO2 99%   BMI 30.23 kg/m   Physical Exam Vitals and nursing note reviewed.  Constitutional:      General: She is not in acute distress.    Appearance: She is well-developed.  HENT:     Head: Normocephalic.     Comments: There is some bruising in the right periorbital area with minimal tenderness over the orbital rim, no nasal bone tenderness, no malocclusion    Mouth/Throat:     Pharynx: No oropharyngeal exudate.  Eyes:     General: No scleral icterus.       Right eye: No discharge.        Left eye: No discharge.     Pupils: Pupils are equal, round, and reactive to light.     Comments: Subconjunctival hemorrhage to the right temporal conjunctive of the right eye  Neck:     Thyroid: No thyromegaly.     Vascular: No JVD.  Cardiovascular:     Rate and Rhythm: Normal rate and regular rhythm.     Heart sounds: Normal heart sounds. No murmur heard.   No friction rub. No gallop.  Pulmonary:     Effort: Pulmonary effort is normal. No respiratory distress.     Breath sounds: Normal breath sounds. No wheezing or rales.  Abdominal:     General: Bowel sounds are normal. There is no distension.     Palpations: Abdomen is soft. There is no mass.     Tenderness: There is no abdominal tenderness.  Genitourinary:    Comments: Chaperone present for rectal exam, there is no sign of rectal prolapse, there is no hemorrhoids, there is no stool or blood in the rectal vault, no masses are palpated.  Hemoccult sent, patient tolerated procedure well without any difficulties. Musculoskeletal:         General: No tenderness. Normal range of motion.     Cervical back: Normal range of motion and neck supple.  Lymphadenopathy:     Cervical: No cervical adenopathy.  Skin:    General: Skin is warm and dry.     Findings: No erythema or rash.  Neurological:     Mental Status: She is alert.     Coordination: Coordination normal.     Comments: The patient has totally normal speech and coordination, normal strength in all 4 extremities, she appears generally weak but awake and alert  Psychiatric:        Behavior: Behavior normal.    ED Results / Procedures / Treatments   Labs (all labs ordered are listed, but only abnormal results are displayed) Labs Reviewed  COMPREHENSIVE METABOLIC PANEL - Abnormal; Notable for the following components:      Result Value   Sodium 130 (*)    Chloride 96 (*)    Glucose, Bld 144 (*)    Creatinine, Ser 1.02 (*)    Total Protein 6.1 (*)    Albumin 3.3 (*)    GFR, Estimated 53 (*)    All other components within normal limits  CBC - Abnormal; Notable for the following components:   RBC 3.03 (*)    Hemoglobin 8.3 (*)    HCT 26.0 (*)    RDW 15.6 (*)  All other components within normal limits  RESP PANEL BY RT-PCR (FLU A&B, COVID) ARPGX2  URINALYSIS, ROUTINE W REFLEX MICROSCOPIC  CBG MONITORING, ED  POC OCCULT BLOOD, ED  TYPE AND SCREEN    EKG None  Radiology No results found.  Procedures Procedures   Medications Ordered in ED Medications - No data to display  ED Course  I have reviewed the triage vital signs and the nursing notes.  Pertinent labs & imaging results that were available during my care of the patient were reviewed by me and considered in my medical decision making (see chart for details).  Clinical Course as of 01/14/21 2016  Wed Jan 14, 2021  1759 I discussed the case with Dr. Fuller Plan of GI service, he recommends the patient be admitted to the hospitalist service and will see the patient in consultation tomorrow unless  things get worse in which case he would like to be called overnight. [BM]  1949 Dr. Mancel Parsons - recommends antibiotics - sinus precautions - no nose blowing or open mouth sneezing - Augmentin X 5 days.  Can f/u in the office in 7-10 days [BM]    Clinical Course User Index [BM] Noemi Chapel, MD   MDM Rules/Calculators/A&P                           Significant anemia, suggest that this is new, Hemoccult test came back positive, BUN and creatinine are unremarkable, mild hyponatremia.  Will discuss with hospitalist for admission, CT scan ordered to make sure there is no intracranial hemorrhage or facial fractures.  The patient is not anticoagulated, will likely need to be admitted, stable blood pressure and heart rate at this time.  I discussed the case with Dr. Nevada Crane - will come to admit.  Final Clinical Impression(s) / ED Diagnoses Final diagnoses:  Lower GI bleed  Orbital fracture, closed, initial encounter ([place])     Noemi Chapel, MD 01/14/21 2017

## 2021-01-14 NOTE — ED Notes (Signed)
Patient transported to CT 

## 2021-01-14 NOTE — ED Provider Notes (Signed)
Emergency Medicine Provider Triage Evaluation Note  Brooke Beasley , a 87 y.o. female  was evaluated in triage.  Pt complains of  GI bleed for the past week.  Has continued to put out bright red blood on the toilet, after this morning she had a syncopal episode after using the bathroom.  She does have a noticeable bruising to the right eye.  On today's visit she is complaining of pain along her head and neck.  Does not take any blood thinners.  She does endorse a prior history of hemorrhoids, feels like she might be bleeding from this.  No prior history of anemia, states no prior history of transfusion.  Review of Systems  Positive: Right eye bruising, neck pain, head pain, rectal bleeding Negative: Abdominal pain, photophobia, nausea, vomiting  Physical Exam  BP (!) 164/80 (BP Location: Left Arm)   Pulse 66   Temp 98.5 F (36.9 C) (Oral)   Resp 17   Ht 5\' 1"  (1.549 m)   Wt 72.6 kg   SpO2 100%   BMI 30.23 kg/m  Gen:   Awake, no distress , right eye bruising Resp:  Normal effort  MSK:   Moves extremities without difficulty  Other:  Alert and oriented x4.  Benign neuro exam.  Medical Decision Making  Medically screening exam initiated at 7:10 AM.  Appropriate orders placed.  Brooke Beasley was informed that the remainder of the evaluation will be completed by another provider, this initial triage assessment does not replace that evaluation, and the importance of remaining in the ED until their evaluation is complete.  Patient here status post syncopal episode after using the bathroom.  Reports she was down for an unknown amount of time.  She does not take any blood thinners.  She did strike the right side of her face.Patient here s/p syncopal episode after using the bathroom. According to pt has a prior hx of hemorrhoids and GI bleed?  CT Head/neck have ordered    Portions of this note were generated with Dragon dictation software. Dictation errors may occur despite best  attempts at proofreading.    Brooke Fitting, PA-C 01/14/21 Brooke Beasley, [place], DO 01/14/21 1439

## 2021-01-14 NOTE — ED Triage Notes (Signed)
Per pt she has not had a regular BM over a week. Has a prolapse rectum. Taking medication at home for stool sofner due to her having a blockage. The stool that is coming out is bright red blood and that she has lost a lot of blood. This morning she was on the toilet using the restroom stood up and she past out. Woke up on the floor. Hit her head on something. Noted bruising to rt eye. C/o pain to head and neck. Doesn't take blood thinners

## 2021-01-14 NOTE — ED Notes (Signed)
Pt given applesauce and water and gingerale to eat/drink. Daughter remains at bedside. Denies further needs.

## 2021-01-14 NOTE — ED Notes (Signed)
C collar applied to pt prior to going to CT per Dr. Sabra Heck.

## 2021-01-14 NOTE — ED Notes (Signed)
Pt here with 1 week of constipation. Did a bowel prep yesterday and she's been increasingly weak since then. Has a prolapsed rectum. Has been having bright red blood when she has a bowel movement for about a week also. Pt did pass out after standing up from toilet yesterday. Not on blood thinners. Pt is AxO x4. Has bruise underneath R eye and to bilateral knees. Daughter was with patient when it happened. Did have LOC.

## 2021-01-14 NOTE — H&P (Addendum)
History and Physical  Brooke Beasley RUE:[id number] DOB: [birth year] DOA: 01/14/2021  Referring physician: Dr. Sabra Heck, [place] PCP: [name], MD  Outpatient Specialists: GI, cardiology. Patient coming from: Home via EMS.  Chief Complaint: Rectal bleeding, generalized weakness and passing out.  HPI: Brooke Beasley is a 87 y.o. female with medical history significant for chronic constipation, rectal prolapse, internal hemorrhoids post banding 3 months ago, diverticulosis, followed outpatient by [facility], essential hypertension, hyperlipidemia, chronic anxiety/depression, who presented to [facility] ED from home via EMS due to rectal bleed, generalized weakness, and passing out.  Patient reports struggling with constipation for the past 2 weeks with intermittent bright red blood per rectum and watery stools.  Last night she took her GoLytely prescription sent by her GI provider and followed the instructions.  As a result she had large liquid stools mixed with blood throughout the night.  Her daughter and son-in-law who live with her were monitoring her overnight.  After having a large watery stool she stood up at the sink to wash her hand, felt dizzy and passed out.  She hit the right side of her face.  Per her daughter she was out for less than a minute.  Her daughter sat her on a chair and she passed out again for less than a minute.  EMS was activated.  Patient reports associated generalized weakness for the past 1 to 2 weeks.  Also reports nocturia.  Denies dysuria.  No melena.  No abdominal pain, nausea or vomiting at the time of this visit.  No use of NSAIDs.  Endorses a minimal headache after hitting her face.  Upon presentation to the ED hemoglobin noted to have dropped from 12 at baseline to 8.  No recurrent bowel movement while in the ED.  CT head and maxillofacial revealed no acute intracranial abnormalities.  Acute fractures of the right orbital bones and maxillary antral walls with  depression of some fracture fragments.  Associated periorbital and right zygomatic hematomas.  Anterior subluxation of C4 on C5 is new since her prior comparison study.  CT cervical spine is suggested to exclude cervical spine injury.  EDP contacted ENT who provided recommendations and will follow-up outpatient.  EDP consulted [facility], Dr. Fuller Plan, who will see in the morning.  TRH asked to admit.   ED Course:  Temperature 99.3.  BP 172/75, pulse 76, respiration rate 16, O2 saturation 98 200% on room air.  Lab studies remarkable for serum sodium 138, glucose 144, creatinine 1.02 with GFR 53.  WBC 7.3, hemoglobin 8.3 from baseline of 12.9(03/17/2020)  Review of Systems: Review of systems as noted in the HPI. All other systems reviewed and are negative.   Past Medical History:  Diagnosis Date   Anxiety    C. difficile diarrhea 07/11/2018   S/p multiple oral vanc treatments (course and tapers) and completed Zinplava monoclonal Ab treatment (05/10/2019) Fecal transplant on hold during COVID pandemic   CAP (community acquired pneumonia) 12/22/2017   Carotid stenosis 10/18/2014   R 1-39%, L 40-59%, rpt 1 yr (09/2014)    CKD (chronic kidney disease) stage 3, GFR 30-59 ml/min (HCC) 08/02/2014   Clostridioides difficile infection    hx 2020   Degenerative disc disease    LS   Depression    nervous breakdonw in 1964-out of work for a year   Diverticulosis    severe by colonoscopy   Family history of adverse reaction to anesthesia    n/v   GERD (gastroesophageal reflux disease)  Glaucoma    Heart murmur 5/13   mitral regurge - on echo    History of hiatal hernia    History of shingles    HTN (hypertension)    Hyperlipidemia    Hypertension    Hypothyroidism    IBS 11/02/2006   Intestinal bacterial overgrowth    In small colon   Maxillary fracture ([place]) 04/08/2012   Osteoarthritis 2016   Jefm Bryant)   Osteoporosis    dexa 2011   Pseudogout 2016   shoulders ([place])   Pseudogout    Past  Surgical History:  Procedure Laterality Date   barium enema  2012   severe diverticulosis, redundant colon   Bowel obstruction  1999   no surgery in hosp x 3 days   COLONOSCOPY  1. 1999  2. 11/04   1. Not finished  2. Slight hemorrhage rectosigmoid area, severe sig diverticulosis   COLONOSCOPY WITH PROPOFOL N/A 09/10/2019   SSP with dysplasia, TA, rpt 3 yrs Marius Ditch, Tally Due, MD)   Dexa  1. [phone]   2. 9/04  3. 3/08    1. OP  2. OP, borderline, spine -2.44T  3. decreased BMD-OP   DEXA  2011   OP in spine   DG KNEE 1-2 VIEWS BILAT     LS x-ray with degenerative disc and facet change   ESOPHAGOGASTRODUODENOSCOPY     Negative   Hemorrhoid procedure  07/2006   JOINT REPLACEMENT     TONSILLECTOMY     TOTAL SHOULDER ARTHROPLASTY Left 11/27/2015   Corky Mull, MD   TOTAL SHOULDER REVISION Left 03/16/2016   Procedure: TOTAL SHOULDER REVISION;  Surgeon: Corky Mull, MD;  Location: [facility] ORS;  Service: Orthopedics;  Laterality: Left;   US ECHOCARDIOGRAPHY  [id number]/[id number]   Normal systolic fxn with EF 84-69%.  Focal basal septal hypertrophy.  Mild diastolic dysfunction.  Mild MR.    Social History:  reports that she has never smoked. She has never used smokeless tobacco. She reports that she does not drink alcohol and does not use drugs.   Allergies  Allergen Reactions   Amlodipine Other (See Comments)    Stomach pains--"feel like my insides are on fire"   Prednisone Other (See Comments)    Heart racing, anxiety --> clarified no anaphylaxis to prednisone. Tolerated prednisone 02/2019 without issue   Ace Inhibitors Other (See Comments)    cough   Alendronate Sodium     REACTION: palpitations   Carvedilol Other (See Comments)    fatigue   Losartan Other (See Comments)    Cr bumped   Other Other (See Comments)    No seeds or corn because of IBS   Paroxetine Hcl Other (See Comments)    Not effective - felt ill on this medicine   Risedronate Sodium     REACTION: joint pain    Silenor [Doxepin Hcl] Other (See Comments)    Hallucinations and racing heart   Simvastatin     REACTION: the full 20 mg pill causes leg pain- can tol 10 mg   Sulfonamide Derivatives     REACTION: rash   Toprol Xl [Metoprolol] Diarrhea    Diarrhea and weakness   Tramadol Other (See Comments)    Felt like passing out   Influenza Vaccines Rash and Other (See Comments)    Led to mental breakdown in 36s     Family History  Problem Relation Age of Onset   Diabetes Brother        post-op  Coronary artery disease Brother    Diabetes Paternal Grandfather    Breast cancer Cousin       Prior to Admission medications   Medication Sig Start Date End Date Taking? Authorizing Provider  acetaminophen (TYLENOL) 500 MG tablet Take 500-1,000 mg by mouth every 6 (six) hours as needed for moderate pain. Depends on pain   Yes [provider]  albuterol (PROVENTIL HFA;VENTOLIN HFA) 108 (90 Base) MCG/ACT inhaler Inhale 2 puffs into the lungs every 6 (six) hours as needed for wheezing or shortness of breath. 01/12/18  Yes [name], MD  bimatoprost (LUMIGAN) 0.01 % SOLN Place 1 drop into both eyes at bedtime.   Yes [provider]  Bioflavonoid Products (VITAMIN C) CHEW Chew 1 tablet by mouth daily.   Yes [provider]  cholecalciferol (VITAMIN D) 1000 units tablet Take 1,000 Units by mouth daily.   Yes [provider]  clonazePAM (KLONOPIN) 0.5 MG tablet TAKE 1 TABLET BY MOUTH AT BEDTIME 09/21/20  Yes [name], MD  colchicine 0.6 MG tablet Take 1 tablet (0.6 mg total) by mouth daily as needed (gout flare). Patient taking differently: Take 0.6 mg by mouth at bedtime. 12/05/20  Yes [name], MD  denosumab [facility]) 60 MG/ML SOSY injection Inject 60 mg into the skin every 6 (six) months. 05/06/20  Yes [name], MD  EUTHYROX 100 MCG tablet Take 1 tablet by mouth once daily 08/04/20  Yes [name], MD  Famotidine-Ca Carb-Mag  Hydrox (PEPCID COMPLETE PO) Take 1 tablet by mouth daily as needed (indigestion).   Yes [provider]  hydrocortisone 2.5 % cream Place 1 application rectally daily as needed (hemorrhoids). 10/23/18  Yes [provider]  Inulin (FIBER CHOICE PO) Take 1 tablet by mouth daily.   Yes [provider]  irbesartan (AVAPRO) 150 MG tablet Take 1 tablet (150 mg total) by mouth daily. 12/19/20  Yes Agbor-Etang, Aaron Edelman, MD  Misc Natural Products (OSTEO BI-FLEX TRIPLE STRENGTH PO) Take 1 tablet by mouth daily. With Vit D   Yes [provider]  ondansetron (ZOFRAN) 4 MG tablet Take 1 tablet (4 mg total) by mouth every 8 (eight) hours as needed for nausea or vomiting. 10/13/20  Yes [name], MD  polyethylene glycol (MIRALAX / GLYCOLAX) 17 g packet Take 17 g by mouth daily.   Yes [provider]  sertraline (ZOLOFT) 100 MG tablet Take 100 mg by mouth in the morning. 10/05/20  Yes [name], MD  Simethicone 180 MG CAPS Take 1 capsule by mouth daily as needed (indigestion).   Yes [provider]  spironolactone (ALDACTONE) 25 MG tablet Take 1 tablet (25 mg total) by mouth daily. 09/29/20  Yes [name], MD  torsemide (DEMADEX) 20 MG tablet Take 20 mg by mouth daily as needed (fluid).   Yes [provider]  TURMERIC PO Take 1 tablet by mouth daily.   Yes [provider]  VITAMIN E PO Take 1 tablet by mouth daily.   Yes [provider]  VOLTAREN 1 % GEL APPLY ONE APPLICATION TOPICALLY 3 TIMES DAILY Patient taking differently: Apply 2 g topically 4 (four) times daily as needed (pain). 09/26/14  Yes [name], MD  Wheat Dextrin (BENEFIBER DRINK MIX PO) Take 15 mLs by mouth daily.   Yes [provider]  furosemide (LASIX) 20 MG tablet Take 1 tablet (20 mg total) by mouth as needed. Patient not taking: No sig reported 03/03/20   Kate Sable, MD  hydrocortisone (  ANUSOL-HC) 2.5 % rectal cream Place 1  application rectally 2 (two) times daily. Patient not taking: Reported on 01/14/2021 10/23/18   [name], MD  methocarbamol (ROBAXIN) 500 MG tablet Take 1 tablet (500 mg total) by mouth every 8 (eight) hours as needed for muscle spasms. Patient not taking: Reported on 01/14/2021 03/18/19   Davonna Belling, MD  Mouthwash Compounding Base (MOUTH WASH-GP) LIQD Take 5 mLs by mouth every 6 (six) hours as needed. Patient not taking: Reported on 01/14/2021 09/15/20   Lin Landsman, MD  Multiple Vitamins-Minerals (PRESERVISION AREDS 2+MULTI VIT) CAPS Take 1 capsule by mouth daily. Patient not taking: Reported on 01/14/2021 12/22/19   [name], MD  omeprazole (PRILOSEC) 20 MG capsule Take 1 capsule (20 mg total) by mouth daily. Patient not taking: Reported on 01/14/2021 10/02/20   [name], MD  Plecanatide (TRULANCE) 3 MG TABS Take 1 tablet by mouth daily. Patient not taking: Reported on 01/14/2021 07/21/20   Lin Landsman, MD  promethazine-phenylephrine (PROMETHAZINE VC) 6.25-5 MG/5ML SYRP Take 5 mLs by mouth every 4 (four) hours as needed for congestion. Patient not taking: Reported on 01/14/2021 09/18/19   Faustino Congress, NP    Physical Exam: BP (!) 172/75   Pulse 61   Temp 99.3 F (37.4 C) (Oral)   Resp 16   Ht 5\' 1"  (1.549 m)   Wt 72.6 kg   SpO2 98%   BMI 30.23 kg/m   General: 87 y.o. year-old female well developed well nourished in no acute distress.  Alert and oriented x3.  Hard of hearing.  Right eye bruising. Cardiovascular: Regular rate and rhythm with no rubs or gallops.  No thyromegaly or JVD noted.  No lower extremity edema. 2/4 pulses in all 4 extremities. Respiratory: Clear to auscultation with no wheezes or rales. Good inspiratory effort. Abdomen: Soft nontender nondistended with normal bowel sounds x4 quadrants. Muskuloskeletal: No cyanosis, clubbing or edema noted bilaterally Neuro: CN II-XII intact, strength, sensation, reflexes Skin: No  ulcerative lesions noted or rashes.  Right bruising. Psychiatry: Judgement and insight appear normal. Mood is appropriate for condition and setting          Labs on Admission:  Basic Metabolic Panel: Recent Labs  Lab 01/14/21 0709  NA 130*  K 4.0  CL 96*  CO2 25  GLUCOSE 144*  BUN 12  CREATININE 1.02*  CALCIUM 9.3   Liver Function Tests: Recent Labs  Lab 01/14/21 0709  AST 19  ALT 14  ALKPHOS 75  BILITOT 0.6  PROT 6.1*  ALBUMIN 3.3*   No results for input(s): LIPASE, AMYLASE in the last 168 hours. No results for input(s): AMMONIA in the last 168 hours. CBC: Recent Labs  Lab 01/14/21 0709  WBC 7.3  HGB 8.3*  HCT 26.0*  MCV 85.8  PLT 267   Cardiac Enzymes: No results for input(s): CKTOTAL, CKMB, CKMBINDEX, TROPONINI in the last 168 hours.  BNP (last 3 results) No results for input(s): BNP in the last 8760 hours.  ProBNP (last 3 results) No results for input(s): PROBNP in the last 8760 hours.  CBG: No results for input(s): GLUCAP in the last 168 hours.  Radiological Exams on Admission: CT HEAD WO CONTRAST (5MM)  Result Date: 01/14/2021 CLINICAL DATA:  Facial trauma EXAM: CT HEAD WITHOUT CONTRAST CT MAXILLOFACIAL WITHOUT CONTRAST TECHNIQUE: Multidetector CT imaging of the head and maxillofacial structures were performed using the standard protocol without intravenous contrast. Multiplanar CT image reconstructions of the maxillofacial structures were also generated.  COMPARISON:  04/08/2012 FINDINGS: CT HEAD FINDINGS Brain: No evidence of acute infarction, hemorrhage, hydrocephalus, extra-axial collection or mass lesion/mass effect. Mild cerebral atrophy. Low-attenuation changes in the deep white matter consistent with small vessel ischemia. Basal ganglia calcifications. Old cerebellar infarcts. Vascular: Moderate intracranial arterial vascular calcifications. Skull: Calvarium appears intact. No acute depressed skull fractures. Other: None. CT MAXILLOFACIAL FINDINGS  Osseous: Depressed fractures of the inferior right orbital rim with mild fat herniation. Nondisplaced fractures of the right lateral orbital rim. Mildly depressed fractures of the right anterior and lateral maxillary antral walls. Nasal bones, nasal septum, pterygoid plates, left facial bones, mandibles, and temporomandibular joints appear intact. Incidental note of about 5 mm anterior subluxation of C4 on C5, new since the previous study. Cervical spine injury is not excluded and cervical spine CT is suggested. Degenerative changes in the temporomandibular joints. Orbits: The globes and extraocular muscles appear intact and symmetrical. Mild periorbital soft tissue hematoma on the right. Sinuses: Opacification of the right maxillary antrum and some of the right ethmoid air cells. Mastoid air cells are clear. Soft tissues: Soft tissue swelling/hematoma over the right zygomatic region. IMPRESSION: 1. No acute intracranial abnormalities. Mild chronic atrophy and small vessel ischemic changes. 2. Acute fractures of the right orbital bones and maxillary antral walls with depression of some fracture fragments. Associated periorbital and right zygomatic hematomas. 3. Anterior subluxation of C4 on C5 is new since a prior comparison study. CT cervical spine is suggested to exclude cervical spine injury. Electronically Signed   By: Lucienne Capers M.D.   On: 01/14/2021 19:12   CT Maxillofacial Wo Contrast  Result Date: 01/14/2021 CLINICAL DATA:  Facial trauma EXAM: CT HEAD WITHOUT CONTRAST CT MAXILLOFACIAL WITHOUT CONTRAST TECHNIQUE: Multidetector CT imaging of the head and maxillofacial structures were performed using the standard protocol without intravenous contrast. Multiplanar CT image reconstructions of the maxillofacial structures were also generated. COMPARISON:  04/08/2012 FINDINGS: CT HEAD FINDINGS Brain: No evidence of acute infarction, hemorrhage, hydrocephalus, extra-axial collection or mass lesion/mass  effect. Mild cerebral atrophy. Low-attenuation changes in the deep white matter consistent with small vessel ischemia. Basal ganglia calcifications. Old cerebellar infarcts. Vascular: Moderate intracranial arterial vascular calcifications. Skull: Calvarium appears intact. No acute depressed skull fractures. Other: None. CT MAXILLOFACIAL FINDINGS Osseous: Depressed fractures of the inferior right orbital rim with mild fat herniation. Nondisplaced fractures of the right lateral orbital rim. Mildly depressed fractures of the right anterior and lateral maxillary antral walls. Nasal bones, nasal septum, pterygoid plates, left facial bones, mandibles, and temporomandibular joints appear intact. Incidental note of about 5 mm anterior subluxation of C4 on C5, new since the previous study. Cervical spine injury is not excluded and cervical spine CT is suggested. Degenerative changes in the temporomandibular joints. Orbits: The globes and extraocular muscles appear intact and symmetrical. Mild periorbital soft tissue hematoma on the right. Sinuses: Opacification of the right maxillary antrum and some of the right ethmoid air cells. Mastoid air cells are clear. Soft tissues: Soft tissue swelling/hematoma over the right zygomatic region. IMPRESSION: 1. No acute intracranial abnormalities. Mild chronic atrophy and small vessel ischemic changes. 2. Acute fractures of the right orbital bones and maxillary antral walls with depression of some fracture fragments. Associated periorbital and right zygomatic hematomas. 3. Anterior subluxation of C4 on C5 is new since a prior comparison study. CT cervical spine is suggested to exclude cervical spine injury. Electronically Signed   By: Lucienne Capers M.D.   On: 01/14/2021 19:12    EKG: I independently  viewed the EKG done and my findings are as followed: Sinus rhythm rate of 92 with PACs, nonspecific ST-T changes.  QTc 460.  Assessment/Plan Present on Admission:  Lower GI  bleed  Active Problems:   Lower GI bleed  Acute lower GI bleed in the setting of diverticulosis and hemorrhoids Reported lower GI bleed intermittently for the past 2 weeks. Positive FOBT in the ED No recurrent bowel movement while in the ED at the time of this dictation. H&H every 6 hours Transfuse hemoglobin less than 8.0 [facility] consulted, Dr. Fuller Plan, will see in the morning. Monitor vital signs  Acute blood loss anemia in the setting of lower GI bleed Baseline hemoglobin 12.9 from 03/17/2020 Presented with hemoglobin of 8.3 Maintain hemoglobin greater than 8.0 Transfuse as indicated Repeat CBC in the morning  Right facial fractures Acute fractures of the right orbital bones, maxillary antral walls with depression of some fracture fragments.  Associated periorbital and right zygomatic hematomas. ENT Dr. Mancel Parsons was contacted by EDP, recommended antibiotics, sinus precautions, no nose blowing or open mouth sneezing.  Augmentin x5 days. Follow-up with Dr. Mancel Parsons ENT in the office in 7 to 10 days. Pain control along with as needed bowel regimen to avoid opioid-induced constipation.  Generalized weakness/syncope Per her daughter at bedside she passed out and was unconscious for less than a minute, twice.  The second event was witnessed while the patient was sitting down at home and in her arms. Suspect secondary to dehydration and orthostatic hypotension Positive orthostatic vital signs in the ED Start gentle IV fluid hydration normal saline at 50 cc/h x 2 days Obtain orthostatic vital signs in the morning, if no improvement consider TED hose placement. PT OT assessment Fall precautions TOC to assist with DC planning  Chronic constipation/rectal prolapse/internal hemorrhoids status post banding 62-month ago Management per GI who has been consulted Currently as needed bowel regimen.  Mild hypovolemic hyponatremia Presented with serum sodium of 130 Start normal saline at 50 cc/h x 2  days Repeat BMP in the morning  Essential hypertension, uncontrolled BP not at goal, elevated Resume home oral antihypertensives IV antihypertensive as needed with parameters Closely monitor vital signs  CKD 3B She appears to be at her baseline creatinine 1.02 with GFR 53 Monitor urine output BMP a.m. Avoid nephrotoxic agents, dehydration and hypotension.  Hyperglycemia Presented with serum glucose of 144 Could be stress related Insulin sliding scale if needed  Chronic anxiety/depression Resume home Klonopin at lower dose 0.25 mg nightly Resume home Zoloft  Hypothyroidism Resume home levothyroxine  Generalized weakness     DVT prophylaxis: SCDs, pharmacological DVT prophylaxis currently contraindicated in the setting of lower GI bleed  Code Status: Full code per patient and daughter at bedside.  Family Communication: Daughter at bedside.  Disposition Plan: Admit to telemetry medical  Consults called: GI and ENT contacted by EDP.  Admission status: Inpatient status.  Patient will require least 2 midnights for further evaluation and treatment of present condition.   Status is: Inpatient    Dispo:  Patient From:  Home  Planned Disposition:  Home  Medically stable for discharge:  No          Kayleen Memos MD [facility] Pager [phone]  If 7PM-7AM, please contact night-coverage [email or web address] Password [email or web address]  01/14/2021, 8:21 PM

## 2021-01-14 NOTE — ED Notes (Signed)
CBG 94 

## 2021-01-14 NOTE — ED Triage Notes (Signed)
BIB [facility] from home. Fall at [place] lives alone. Significant hx of GI issues. GI bleed rectally x 1 week PCP aware of same. Over the last week progressively weak. Per pt she LOC however per first responders pt became weak and sat down. Bruising to the lt eye. Per pt she hit her head on the bathroom sink.

## 2021-01-14 NOTE — ED Notes (Signed)
Report given to Jordan,RN

## 2021-01-14 NOTE — ED Notes (Signed)
Pt taken to restroom via wheelchair. IV pulled out slightly because it's uncomfortable. Daughter remains at bedside. Denies further needs.

## 2021-01-14 NOTE — ED Notes (Signed)
Dr. [place] Crane ok'd c collar to be removed. C collar d/c'd.

## 2021-01-15 ENCOUNTER — Inpatient Hospital Stay (HOSPITAL_COMMUNITY): Payer: PPO

## 2021-01-15 DIAGNOSIS — K922 Gastrointestinal hemorrhage, unspecified: Secondary | ICD-10-CM | POA: Diagnosis not present

## 2021-01-15 LAB — BASIC METABOLIC PANEL
Anion gap: 7 (ref 5–15)
BUN: 8 mg/dL (ref 8–23)
CO2: 24 mmol/L (ref 22–32)
Calcium: 8.6 mg/dL — ABNORMAL LOW (ref 8.9–10.3)
Chloride: 101 mmol/L (ref 98–111)
Creatinine, Ser: 0.93 mg/dL (ref 0.44–1.00)
GFR, Estimated: 59 mL/min — ABNORMAL LOW (ref >60)
Glucose, Bld: 109 mg/dL — ABNORMAL HIGH (ref 70–99)
Potassium: 3.9 mmol/L (ref 3.5–5.1)
Sodium: 132 mmol/L — ABNORMAL LOW (ref 135–145)

## 2021-01-15 LAB — PREPARE RBC (CROSSMATCH)

## 2021-01-15 LAB — MAGNESIUM: Magnesium: 1.9 mg/dL (ref 1.7–2.4)

## 2021-01-15 LAB — GLUCOSE, CAPILLARY
Glucose-Capillary: 96 mg/dL (ref 70–99)
Glucose-Capillary: 114 mg/dL — ABNORMAL HIGH (ref 70–99)
Glucose-Capillary: 104 mg/dL — ABNORMAL HIGH (ref 70–99)
Glucose-Capillary: 100 mg/dL — ABNORMAL HIGH (ref 70–99)
Glucose-Capillary: 105 mg/dL — ABNORMAL HIGH (ref 70–99)
Glucose-Capillary: 115 mg/dL — ABNORMAL HIGH (ref 70–99)

## 2021-01-15 LAB — CBC
HCT: 22.3 % — ABNORMAL LOW (ref 36.0–46.0)
Hemoglobin: 7.4 g/dL — ABNORMAL LOW (ref 12.0–15.0)
MCH: 27.9 pg (ref 26.0–34.0)
MCHC: 33.2 g/dL (ref 30.0–36.0)
MCV: 84.2 fL (ref 80.0–100.0)
Platelets: 227 10*3/uL (ref 150–400)
RBC: 2.65 MIL/uL — ABNORMAL LOW (ref 3.87–5.11)
RDW: 15.8 % — ABNORMAL HIGH (ref 11.5–15.5)
WBC: 5.4 10*3/uL (ref 4.0–10.5)
nRBC: 0 % (ref 0.0–0.2)

## 2021-01-15 LAB — HEMOGLOBIN AND HEMATOCRIT, BLOOD
HCT: 22.8 % — ABNORMAL LOW (ref 36.0–46.0)
HCT: 25.1 % — ABNORMAL LOW (ref 36.0–46.0)
HCT: 25.1 % — ABNORMAL LOW (ref 36.0–46.0)
Hemoglobin: 7.4 g/dL — ABNORMAL LOW (ref 12.0–15.0)
Hemoglobin: 8.4 g/dL — ABNORMAL LOW (ref 12.0–15.0)
Hemoglobin: 8.2 g/dL — ABNORMAL LOW (ref 12.0–15.0)

## 2021-01-15 LAB — HEMOGLOBIN A1C
Hgb A1c MFr Bld: 5.7 % — ABNORMAL HIGH (ref 4.8–5.6)
Mean Plasma Glucose: 117 mg/dL

## 2021-01-15 MED ORDER — IOHEXOL 9 MG/ML PO SOLN
ORAL | Status: AC
  Administered 2021-01-15: 500 mL
  Filled 2021-01-15: qty 1000

## 2021-01-15 MED ORDER — SODIUM CHLORIDE 0.9% IV SOLUTION: Freq: Once | INTRAVENOUS | Status: DC

## 2021-01-15 MED ORDER — IOHEXOL 350 MG/ML SOLN
80.0000 mL | Freq: Once | INTRAVENOUS | Status: AC | PRN
  Administered 2021-01-15: 80 mL via INTRAVENOUS

## 2021-01-15 MED ORDER — SODIUM CHLORIDE 0.9% IV SOLUTION: Freq: Once | INTRAVENOUS | Status: AC

## 2021-01-15 NOTE — Evaluation (Signed)
Occupational Therapy Evaluation Patient Details Name: Brooke Beasley MRN: [id number] DOB: 1930/08/09 Today's Date: 01/15/2021   History of Present Illness 87 year old female admitted after GI bleed and syncopal episode where she fell and hit her head. CT clear for intracranial abnormalities. PMH of anxiety, CKD stage III, Glaucoma, HTN, and Depression.   Clinical Impression   PTA, pt was living at home with daughter, was independent in ADLs, and daughter assisted with heavy IADLs. Pt reported she was not using an assistive device. Pt currently requiring min guard-setup for UB ADLs, Min guard for LB ADLs, and Min guard-Min A for functional mobility. Pt with one small LOB during initial sit to stand from bed that therapist assisted to correct. Pt with decreased balance, functional strength, and increased fall risk limiting ability to complete ADLs safely and independently. Pt would benefit from continued OT in the acute setting to address performance problems. Due to adequate caregiver support and current level of function, recommending home with 24 hour supervision and no OT follow up.     Recommendations for follow up therapy are one component of a multi-disciplinary discharge planning process, led by the attending physician.  Recommendations may be updated based on patient status, additional functional criteria and insurance authorization.   Follow Up Recommendations    No OT follow-up; 24hr supervision   Equipment Recommendations     None recommended by OT  Recommendations for Other Services       Precautions / Restrictions Precautions Precautions: Fall      Mobility Bed Mobility Overal bed mobility: Modified Independent             General bed mobility comments: Used rail and HOB raised    Transfers Overall transfer level: Needs assistance   Transfers: Sit to/from Stand Sit to Stand: Min assist         General transfer comment: Pt slightly impulsive and  attempted to stand before therapist had managed lines, but HOH may have impacted performance.    Balance Overall balance assessment: History of Falls                                         ADL either performed or assessed with clinical judgement   ADL Overall ADL's : Needs assistance/impaired Eating/Feeding: Modified independent   Grooming: Wash/dry face;Wash/dry hands;Standing;Min guard Grooming Details (indicate cue type and reason): pt required min guard for safety while standing at sink unsupported and washing face. Upper Body Bathing: Sitting;Set up   Lower Body Bathing: Min guard;Sit to/from stand   Upper Body Dressing : Set up;Sitting   Lower Body Dressing: Min guard Lower Body Dressing Details (indicate cue type and reason): Pt donned socks EOB using figure four positiong with min guard for safety Toilet Transfer: Min guard;Cueing for safety Toilet Transfer Details (indicate cue type and reason): Simulated EOB, pt required min guard and with one LOB that therapist corrected upon standing. Toileting- Clothing Manipulation and Hygiene: Sit to/from stand;Minimal assistance       Functional mobility during ADLs: Min guard General ADL Comments: Pt with decreased balance, functional strength, and activity tolerance.     Vision         Perception     Praxis      Pertinent Vitals/Pain Pain Assessment: Faces Faces Pain Scale: No hurt Pain Intervention(s): Monitored during session     Hand Dominance Left  Extremity/Trunk Assessment Upper Extremity Assessment Upper Extremity Assessment: Generalized weakness   Lower Extremity Assessment Lower Extremity Assessment: Defer to PT evaluation   Cervical / Trunk Assessment Cervical / Trunk Assessment: Normal   Communication Communication Communication: HOH   Cognition Arousal/Alertness: Awake/alert Behavior During Therapy: WFL for tasks assessed/performed Overall Cognitive Status: Within  Functional Limits for tasks assessed                                 General Comments: Pt required slightly impulsive. HOH may have impacted assessment.   General Comments  Daughter present during session    Exercises     Shoulder Instructions      Home Living Family/patient expects to be discharged to:: Private residence Living Arrangements: Children Available Help at Discharge: Family;Available PRN/intermittently Type of Home: House Home Access: Stairs to enter Entrance Stairs-Number of Steps: 1   Home Layout: Able to live on main level with bedroom/bathroom     Bathroom Shower/Tub: Teacher, early years/pre: Handicapped height     Home Equipment: Bedside commode;Shower seat   Additional Comments: Pts daughter lives with her      Prior Functioning/Environment Level of Independence: Needs assistance        Comments: Before admission, pt was living at her home with daughter, taking care of her chickens, doing light yard work, independent in ADLs, light IADLs, and daughter assisted with heavy IADLs. Pt reported she did not use an assistive device        OT Problem List: Decreased strength;Decreased activity tolerance;Impaired balance (sitting and/or standing);Decreased safety awareness;Pain      OT Treatment/Interventions: Self-care/ADL training;Therapeutic exercise;Energy conservation;Therapeutic activities;Patient/family education;DME and/or AE instruction    OT Goals(Current goals can be found in the care plan section) Acute Rehab OT Goals Patient Stated Goal: To go home OT Goal Formulation: With patient Time For Goal Achievement: 01/29/21 Potential to Achieve Goals: Good  OT Frequency: Min 2X/week   Barriers to D/C:            Co-evaluation              AM-PAC OT "6 Clicks" Daily Activity     Outcome Measure Help from another person eating meals?: None Help from another person taking care of personal grooming?: A  Little Help from another person toileting, which includes using toliet, bedpan, or urinal?: A Little Help from another person bathing (including washing, rinsing, drying)?: A Little Help from another person to put on and taking off regular upper body clothing?: A Little Help from another person to put on and taking off regular lower body clothing?: A Little 6 Click Score: 19   End of Session Equipment Utilized During Treatment: Gait belt Nurse Communication: Mobility status  Activity Tolerance: Patient tolerated treatment well Patient left: in chair;with chair alarm set;with family/visitor present  OT Visit Diagnosis: Unsteadiness on feet (R26.81);Other abnormalities of gait and mobility (R26.89);History of falling (Z91.81);Muscle weakness (generalized) (M62.81)                Time: 4801-6553 OT Time Calculation (min): 20 min Charges:  OT General Charges $OT Visit: 1 Visit OT Evaluation $OT Eval Moderate Complexity: 1 Mod  Jackquline Denmark, OTS Acute Rehab Office: [phone]   Brooke Beasley 01/15/2021, 12:06 PM

## 2021-01-15 NOTE — Progress Notes (Signed)
PROGRESS NOTE    Brooke Beasley  MWU:[id number] DOB: April 19, 1931 DOA: 01/14/2021 PCP: [name], MD    Brief Narrative:  87 year old from home brought to the ER with massive rectal bleeding and syncopal episode at home, fell and hit her face.  Patient has history of chronic severe constipation and usually takes Benefiber and MiraLAX daily.  Over last 1 week she was having constipation, treated with large dose of MiraLAX by her gastroenterology, was having mild intermittent bleeding during last 1 week, yesterday she had a lot of blood with all attempts at bowel movements.  In the morning, she got up to go to the bathroom and wash her hands, passed out and hit her head. In the emergency room, skeletal survey positive for acute right orbital fracture and maxillary fracture.  Baseline hemoglobin 12.9, found to have a hemoglobin 8.3. Patient does have history of severe diverticulosis, bleeding internal hemorrhoids status post banding recently, colonoscopy 1 year ago with above findings. Admitted with severe symptomatic anemia.   Assessment & Plan:   Active Problems:   Lower GI bleed  Severe symptomatic anemia due to acute blood loss, acute lower GI bleeding in the setting of diverticulosis and hemorrhoids, exacerbated by recent constipation and large volume laxatives: Baseline hemoglobin 12.9-presentation 8.3-repeat hemoglobin 7.4. Due to significant symptoms, she will benefit with blood transfusion.  1 unit of PRBC today.  Hemoglobin every 6 hours and transfuse for less than 8. Known diverticular disease and now is stabilizing.  No clinical bleeding since admission. GI following. CT scan with contrast planned today, continue IV fluids and close monitoring.  Right orbital fracture: Stable.  Oral surgery by ER who recommended Augmentin for 5 days and outpatient follow-up with Dr. Mancel Parsons.  Pain controlled.  Mobility with PT OT.  Generalized weakness and syncope: Due to acute blood  loss.  Work with PT OT.  Anxiety and depression: Chronic.  She is on Zoloft and Klonopin that she will continue.  Hypothyroidism: On Synthroid.     DVT prophylaxis: SCDs Start: 01/14/21 2012   Code Status: Full code Family Communication: Patient's daughter at the bedside Disposition Plan: Status is: Inpatient  Remains inpatient appropriate because:IV treatments appropriate due to intensity of illness or inability to take PO and Inpatient level of care appropriate due to severity of illness  Dispo:  Patient From: Home  Planned Disposition: Home with home health.  Medically stable for discharge: No           Consultants:  Gastroenterology ENT, curbside by ER physician  Procedures:  None  Antimicrobials:  None   Subjective: Patient seen and examined.  Hard of hearing.  Today morning, she denied any complaints.  She does have some opaque left-sided abdominal pain otherwise denies any nausea vomiting.  No bowel movements or blood per rectum since admission last night.  Daughter at the bedside.  Objective: Vitals:   01/15/21 0800 01/15/21 0842 01/15/21 1109 01/15/21 1128  BP: (!) 160/58 (!) 148/71 (!) 124/50 118/70  Pulse: 82 73 83 60  Resp: 18 16 20 20   Temp: 98.1 F (36.7 C) 98.6 F (37 C) 98.1 F (36.7 C) 98.4 F (36.9 C)  TempSrc: Oral Oral Oral   SpO2: 100% 97% 99% 100%  Weight:      Height:        Intake/Output Summary (Last 24 hours) at 01/15/2021 1345 Last data filed at 01/15/2021 0400 Gross per 24 hour  Intake 332.5 ml  Output --  Net 332.5 ml  Filed Weights   01/14/21 0649 01/15/21 0410  Weight: 72.6 kg 72.2 kg    Examination:  General exam: Appears comfortable, slightly anxious. She has ecchymosis around her right eye.  Sclera and conjunctiva clear. Respiratory system: Clear to auscultation. Respiratory effort normal.  No added sounds. Cardiovascular system: S1 & S2 heard, RRR.  Gastrointestinal system: Nontender.  Bowel sounds  present.  No localized tenderness along the lower quadrants. Central nervous system: Alert and oriented. No focal neurological deficits. Extremities: Symmetric 5 x 5 power. Skin: No rashes, lesions or ulcers Psychiatry: Judgement and insight appear normal.  Anxious.    Data Reviewed: I have personally reviewed following labs and imaging studies  CBC: Recent Labs  Lab 01/14/21 0709 01/14/21 2100 01/15/21 0313 01/15/21 0832  WBC 7.3  --   --  5.4  HGB 8.3* 7.4* 7.4* 7.4*  HCT 26.0* 23.0* 22.8* 22.3*  MCV 85.8  --   --  84.2  PLT 267  --   --  270   Basic Metabolic Panel: Recent Labs  Lab 01/14/21 0709 01/15/21 0832  NA 130* 132*  K 4.0 3.9  CL 96* 101  CO2 25 24  GLUCOSE 144* 109*  BUN 12 8  CREATININE 1.02* 0.93  CALCIUM 9.3 8.6*  MG  --  1.9   GFR: Estimated Creatinine Clearance: 38 mL/min (by C-G formula based on SCr of 0.93 mg/dL). Liver Function Tests: Recent Labs  Lab 01/14/21 0709  AST 19  ALT 14  ALKPHOS 75  BILITOT 0.6  PROT 6.1*  ALBUMIN 3.3*   No results for input(s): LIPASE, AMYLASE in the last 168 hours. No results for input(s): AMMONIA in the last 168 hours. Coagulation Profile: No results for input(s): INR, PROTIME in the last 168 hours. Cardiac Enzymes: No results for input(s): CKTOTAL, CKMB, CKMBINDEX, TROPONINI in the last 168 hours. BNP (last 3 results) No results for input(s): PROBNP in the last 8760 hours. HbA1C: No results for input(s): HGBA1C in the last 72 hours. CBG: Recent Labs  Lab 01/15/21 0246 01/15/21 0818 01/15/21 1227  GLUCAP 114* 104* 100*   Lipid Profile: No results for input(s): CHOL, HDL, LDLCALC, TRIG, CHOLHDL, LDLDIRECT in the last 72 hours. Thyroid Function Tests: No results for input(s): TSH, T4TOTAL, FREET4, T3FREE, THYROIDAB in the last 72 hours. Anemia Panel: No results for input(s): VITAMINB12, FOLATE, FERRITIN, TIBC, IRON, RETICCTPCT in the last 72 hours. Sepsis Labs: No results for input(s):  PROCALCITON, LATICACIDVEN in the last 168 hours.  Recent Results (from the past 240 hour(s))  Resp Panel by RT-PCR (Flu A&B, Covid) Nasopharyngeal Swab     Status: None   Collection Time: 01/14/21  5:27 PM   Specimen: Nasopharyngeal Swab; Nasopharyngeal(NP) swabs in vial transport medium  Result Value Ref Range Status   SARS Coronavirus 2 by RT PCR NEGATIVE NEGATIVE Final    Comment: (NOTE) SARS-CoV-2 target nucleic acids are NOT DETECTED.  The SARS-CoV-2 RNA is generally detectable in upper respiratory specimens during the acute phase of infection. The lowest concentration of SARS-CoV-2 viral copies this assay can detect is 138 copies/mL. A negative result does not preclude SARS-Cov-2 infection and should not be used as the sole basis for treatment or other patient management decisions. A negative result may occur with  improper specimen collection/handling, submission of specimen other than nasopharyngeal swab, presence of viral mutation(s) within the areas targeted by this assay, and inadequate number of viral copies(<138 copies/mL). A negative result must be combined with clinical observations, patient history,  and epidemiological information. The expected result is Negative.  Fact Sheet for Patients:  EntrepreneurPulse.com.au  Fact Sheet for Healthcare Providers:  IncredibleEmployment.be  This test is no t yet approved or cleared by the [place] FDA and  has been authorized for detection and/or diagnosis of SARS-CoV-2 by FDA under an Emergency Use Authorization (EUA). This EUA will remain  in effect (meaning this test can be used) for the duration of the COVID-19 declaration under Section 564(b)(1) of the Act, 21 U.S.C.section 360bbb-3(b)(1), unless the authorization is terminated  or revoked sooner.       Influenza A by PCR NEGATIVE NEGATIVE Final   Influenza B by PCR NEGATIVE NEGATIVE Final    Comment: (NOTE) The Xpert Xpress  SARS-CoV-2/FLU/RSV plus assay is intended as an aid in the diagnosis of influenza from Nasopharyngeal swab specimens and should not be used as a sole basis for treatment. Nasal washings and aspirates are unacceptable for Xpert Xpress SARS-CoV-2/FLU/RSV testing.  Fact Sheet for Patients: EntrepreneurPulse.com.au  Fact Sheet for Healthcare Providers: IncredibleEmployment.be  This test is not yet approved or cleared by the [place] FDA and has been authorized for detection and/or diagnosis of SARS-CoV-2 by FDA under an Emergency Use Authorization (EUA). This EUA will remain in effect (meaning this test can be used) for the duration of the COVID-19 declaration under Section 564(b)(1) of the Act, 21 U.S.C. section 360bbb-3(b)(1), unless the authorization is terminated or revoked.  Performed at [facility], [place] [address], [place], [place] [address]          Radiology Studies: CT HEAD WO CONTRAST (5MM)  Result Date: 01/14/2021 CLINICAL DATA:  Facial trauma EXAM: CT HEAD WITHOUT CONTRAST CT MAXILLOFACIAL WITHOUT CONTRAST TECHNIQUE: Multidetector CT imaging of the head and maxillofacial structures were performed using the standard protocol without intravenous contrast. Multiplanar CT image reconstructions of the maxillofacial structures were also generated. COMPARISON:  04/08/2012 FINDINGS: CT HEAD FINDINGS Brain: No evidence of acute infarction, hemorrhage, hydrocephalus, extra-axial collection or mass lesion/mass effect. Mild cerebral atrophy. Low-attenuation changes in the deep white matter consistent with small vessel ischemia. Basal ganglia calcifications. Old cerebellar infarcts. Vascular: Moderate intracranial arterial vascular calcifications. Skull: Calvarium appears intact. No acute depressed skull fractures. Other: None. CT MAXILLOFACIAL FINDINGS Osseous: Depressed fractures of the inferior right orbital rim with mild fat herniation.  Nondisplaced fractures of the right lateral orbital rim. Mildly depressed fractures of the right anterior and lateral maxillary antral walls. Nasal bones, nasal septum, pterygoid plates, left facial bones, mandibles, and temporomandibular joints appear intact. Incidental note of about 5 mm anterior subluxation of C4 on C5, new since the previous study. Cervical spine injury is not excluded and cervical spine CT is suggested. Degenerative changes in the temporomandibular joints. Orbits: The globes and extraocular muscles appear intact and symmetrical. Mild periorbital soft tissue hematoma on the right. Sinuses: Opacification of the right maxillary antrum and some of the right ethmoid air cells. Mastoid air cells are clear. Soft tissues: Soft tissue swelling/hematoma over the right zygomatic region. IMPRESSION: 1. No acute intracranial abnormalities. Mild chronic atrophy and small vessel ischemic changes. 2. Acute fractures of the right orbital bones and maxillary antral walls with depression of some fracture fragments. Associated periorbital and right zygomatic hematomas. 3. Anterior subluxation of C4 on C5 is new since a prior comparison study. CT cervical spine is suggested to exclude cervical spine injury. Electronically Signed   By: Lucienne Capers M.D.   On: 01/14/2021 19:12   CT Cervical Spine Wo  Contrast  Result Date: 01/14/2021 CLINICAL DATA:  Fall with trauma to the head and neck. EXAM: CT CERVICAL SPINE WITHOUT CONTRAST TECHNIQUE: Multidetector CT imaging of the cervical spine was performed without intravenous contrast. Multiplanar CT image reconstructions were also generated. COMPARISON:  None. FINDINGS: Alignment: Exaggeration of the cervical lordosis in the upper cervical region. 2 mm degenerative anterolisthesis at C4-5. Straightening of the cervical spine from C5-C7. No traumatic malalignment. Skull base and vertebrae: No regional fracture or focal bone lesion. Soft tissues and spinal canal: No  traumatic soft tissue finding. Extensive calcification at the carotid artery bifurcations. Disc levels: Chronic arthritis at the C1-2 articulation. No encroachment upon the neural spaces. C2-3: Bulging of the disc.  No compressive stenosis. C3-4: Endplate osteophytes. Bilateral facet osteoarthritis. Mild bilateral foraminal narrowing. C4-5: Facet osteoarthritis with 2 mm of anterolisthesis. No compressive canal or foraminal narrowing. C5-6: Endplate osteophytes. Mild bilateral bony foraminal narrowing. C6-7: Disc space narrowing with endplate osteophytes. No compressive foraminal narrowing. C7-T1: Mild facet osteoarthritis.  No stenosis. Upper chest: Negative Other: None IMPRESSION: No acute or traumatic finding. Chronic degenerative changes of the cervical spine as outlined above. No compressive canal stenosis suspected. Electronically Signed   By: Nelson Chimes M.D.   On: 01/14/2021 20:29   CT Maxillofacial Wo Contrast  Result Date: 01/14/2021 CLINICAL DATA:  Facial trauma EXAM: CT HEAD WITHOUT CONTRAST CT MAXILLOFACIAL WITHOUT CONTRAST TECHNIQUE: Multidetector CT imaging of the head and maxillofacial structures were performed using the standard protocol without intravenous contrast. Multiplanar CT image reconstructions of the maxillofacial structures were also generated. COMPARISON:  04/08/2012 FINDINGS: CT HEAD FINDINGS Brain: No evidence of acute infarction, hemorrhage, hydrocephalus, extra-axial collection or mass lesion/mass effect. Mild cerebral atrophy. Low-attenuation changes in the deep white matter consistent with small vessel ischemia. Basal ganglia calcifications. Old cerebellar infarcts. Vascular: Moderate intracranial arterial vascular calcifications. Skull: Calvarium appears intact. No acute depressed skull fractures. Other: None. CT MAXILLOFACIAL FINDINGS Osseous: Depressed fractures of the inferior right orbital rim with mild fat herniation. Nondisplaced fractures of the right lateral orbital  rim. Mildly depressed fractures of the right anterior and lateral maxillary antral walls. Nasal bones, nasal septum, pterygoid plates, left facial bones, mandibles, and temporomandibular joints appear intact. Incidental note of about 5 mm anterior subluxation of C4 on C5, new since the previous study. Cervical spine injury is not excluded and cervical spine CT is suggested. Degenerative changes in the temporomandibular joints. Orbits: The globes and extraocular muscles appear intact and symmetrical. Mild periorbital soft tissue hematoma on the right. Sinuses: Opacification of the right maxillary antrum and some of the right ethmoid air cells. Mastoid air cells are clear. Soft tissues: Soft tissue swelling/hematoma over the right zygomatic region. IMPRESSION: 1. No acute intracranial abnormalities. Mild chronic atrophy and small vessel ischemic changes. 2. Acute fractures of the right orbital bones and maxillary antral walls with depression of some fracture fragments. Associated periorbital and right zygomatic hematomas. 3. Anterior subluxation of C4 on C5 is new since a prior comparison study. CT cervical spine is suggested to exclude cervical spine injury. Electronically Signed   By: Lucienne Capers M.D.   On: 01/14/2021 19:12        Scheduled Meds:  sodium chloride   Intravenous Once   amoxicillin-clavulanate  1 tablet Oral Q12H   cholecalciferol  1,000 Units Oral Daily   clonazePAM  0.25 mg Oral QHS   insulin aspart  0-5 Units Subcutaneous QHS   insulin aspart  0-9 Units Subcutaneous TID WC  irbesartan  150 mg Oral Daily   levothyroxine  100 mcg Oral Daily   sertraline  100 mg Oral q AM   spironolactone  25 mg Oral Daily   Continuous Infusions:  sodium chloride 50 mL/hr at 01/14/21 2121     LOS: 1 day    Time spent: 35 minutes    Barb Merino, MD [facility] Pager [phone]

## 2021-01-15 NOTE — Plan of Care (Signed)
  Problem: Education: Goal: Knowledge of General Education information will improve Description: Including pain rating scale, medication(s)/side effects and non-pharmacologic comfort measures Outcome: Progressing   Problem: Clinical Measurements: Goal: Ability to maintain clinical measurements within normal limits will improve Outcome: Progressing   

## 2021-01-15 NOTE — Progress Notes (Signed)
Patient assisted to the restroom, had a bloody BM. Patient now back in bed, blood transfusion is infusing. Patient given oral contrast for her imaging studies. No complaints and denies needs at this time.

## 2021-01-15 NOTE — Consult Note (Addendum)
Consultation  Referring Provider: TRH/Hall Primary Care Physician:  [name], MD Primary Gastroenterologist:  Dr. Julaine Fusi  Reason for Consultation: Abdominal pain, GI bleeding, anemia  HPI: Brooke Beasley is a 87 y.o. female, who was admitted last evening through the emergency room after she had had a syncopal episode at home, and fell and hit her face..  She is complaining of generalized weakness.  According to the patient and her daughter she has problems with chronic severe constipation and usually takes Benefiber and MiraLAX on a daily basis.  Apparently over the past week or so the constipation has been more severe and she been unable to pass much of anything other than some liquid per rectum.  She had been instructed by her gastroenterologist Dr. Julaine Fusi to take a MiraLAX purge which they did on Tuesday evening.  Patient says that over the past week or more she has been seeing a lot of blood with all attempts at bowel movements.  She says most of the time she will just pass a little bit of blood and some liquid or sludgy appearing stool was not having any real bowel movements.  She says at times she was just passing blood if she had an urge for bowel movement.  She is not a great historian but also says she has been having a lot of discomfort in her left abdomen over the past week or so.  No fever or chills, some nausea, no vomiting and her daughter says she has not been eating well. No blood thinners and no regular NSAIDs. Patient apparently completed the MiraLAX purge continued to passage of some blood and liquid stool and no significant amounts of formed stool etc.  Yesterday morning she got up to go to the bathroom and wash her hands and had a syncopal episode. CT imaging in the emergency room showed an acute right orbital fracture and maxillary fracture.  She did not have any GI imaging. Baseline hemoglobin noted to be 12.9 as of November 2021 and was  8.3 on admission yesterday, WBC of 7.3 Creatinine 1.02/sodium 138  Today she says she continues to feel very weak, she has not had any active bleeding since admission Hemoglobin down to 7.4.  Other medical issues include prior history of C. difficile 2020, chronic anxiety, chronic kidney disease, hypertension, hyperlipidemia, IBS.  She did have colonoscopy in May 2021 per Dr. Marius Ditch with finding of multiple sigmoid diverticuli, small polyps were removed 5 to 6 mm in size, noted to have rectal prolapse and large external hemorrhoids.  Patient daughter reports that she did have hemorrhoidal banding done per Dr. Marius Ditch -2 to 3 months ago in her office for complaints of rectal bleeding.  The bleeding had stopped until over this past week.   Past Medical History:  Diagnosis Date   Anxiety    C. difficile diarrhea 07/11/2018   S/p multiple oral vanc treatments (course and tapers) and completed Zinplava monoclonal Ab treatment (05/10/2019) Fecal transplant on hold during COVID pandemic   CAP (community acquired pneumonia) 12/22/2017   Carotid stenosis 10/18/2014   R 1-39%, L 40-59%, rpt 1 yr (09/2014)    CKD (chronic kidney disease) stage 3, GFR 30-59 ml/min (HCC) 08/02/2014   Clostridioides difficile infection    hx 2020   Degenerative disc disease    LS   Depression    nervous breakdonw in 1964-out of work for a year   Diverticulosis    severe by colonoscopy   Family history of  adverse reaction to anesthesia    n/v   GERD (gastroesophageal reflux disease)    Glaucoma    Heart murmur 5/13   mitral regurge - on echo    History of hiatal hernia    History of shingles    HTN (hypertension)    Hyperlipidemia    Hypertension    Hypothyroidism    IBS 11/02/2006   Intestinal bacterial overgrowth    In small colon   Maxillary fracture ([place]) 04/08/2012   Osteoarthritis 2016   Jefm Bryant)   Osteoporosis    dexa 2011   Pseudogout 2016   shoulders ([place])   Pseudogout     Past Surgical  History:  Procedure Laterality Date   barium enema  2012   severe diverticulosis, redundant colon   Bowel obstruction  1999   no surgery in hosp x 3 days   COLONOSCOPY  1. 1999  2. 11/04   1. Not finished  2. Slight hemorrhage rectosigmoid area, severe sig diverticulosis   COLONOSCOPY WITH PROPOFOL N/A 09/10/2019   SSP with dysplasia, TA, rpt 3 yrs Marius Ditch, Tally Due, MD)   Dexa  1. [phone]   2. 9/04  3. 3/08    1. OP  2. OP, borderline, spine -2.44T  3. decreased BMD-OP   DEXA  2011   OP in spine   DG KNEE 1-2 VIEWS BILAT     LS x-ray with degenerative disc and facet change   ESOPHAGOGASTRODUODENOSCOPY     Negative   Hemorrhoid procedure  07/2006   JOINT REPLACEMENT     TONSILLECTOMY     TOTAL SHOULDER ARTHROPLASTY Left 11/27/2015   Corky Mull, MD   TOTAL SHOULDER REVISION Left 03/16/2016   Procedure: TOTAL SHOULDER REVISION;  Surgeon: Corky Mull, MD;  Location: [facility] ORS;  Service: Orthopedics;  Laterality: Left;   US ECHOCARDIOGRAPHY  71/2458   Normal systolic fxn with EF 09-98%.  Focal basal septal hypertrophy.  Mild diastolic dysfunction.  Mild MR.    Prior to Admission medications   Medication Sig Start Date End Date Taking? Authorizing Provider  acetaminophen (TYLENOL) 500 MG tablet Take 500-1,000 mg by mouth every 6 (six) hours as needed for moderate pain. Depends on pain   Yes [provider]  albuterol (PROVENTIL HFA;VENTOLIN HFA) 108 (90 Base) MCG/ACT inhaler Inhale 2 puffs into the lungs every 6 (six) hours as needed for wheezing or shortness of breath. 01/12/18  Yes [name], MD  bimatoprost (LUMIGAN) 0.01 % SOLN Place 1 drop into both eyes at bedtime.   Yes [provider]  Bioflavonoid Products (VITAMIN C) CHEW Chew 1 tablet by mouth daily.   Yes [provider]  cholecalciferol (VITAMIN D) 1000 units tablet Take 1,000 Units by mouth daily.   Yes [provider]  clonazePAM (KLONOPIN) 0.5 MG tablet TAKE 1 TABLET BY  MOUTH AT BEDTIME 09/21/20  Yes [name], MD  colchicine 0.6 MG tablet Take 1 tablet (0.6 mg total) by mouth daily as needed (gout flare). Patient taking differently: Take 0.6 mg by mouth at bedtime. 12/05/20  Yes [name], MD  denosumab [facility]) 60 MG/ML SOSY injection Inject 60 mg into the skin every 6 (six) months. 05/06/20  Yes [name], MD  EUTHYROX 100 MCG tablet Take 1 tablet by mouth once daily 08/04/20  Yes [name], MD  Famotidine-Ca Carb-Mag Hydrox (PEPCID COMPLETE PO) Take 1 tablet by mouth daily as needed (indigestion).   Yes [provider]  hydrocortisone 2.5 %  cream Place 1 application rectally daily as needed (hemorrhoids). 10/23/18  Yes [provider]  Inulin (FIBER CHOICE PO) Take 1 tablet by mouth daily.   Yes [provider]  irbesartan (AVAPRO) 150 MG tablet Take 1 tablet (150 mg total) by mouth daily. 12/19/20  Yes Agbor-Etang, Aaron Edelman, MD  Misc Natural Products (OSTEO BI-FLEX TRIPLE STRENGTH PO) Take 1 tablet by mouth daily. With Vit D   Yes [provider]  ondansetron (ZOFRAN) 4 MG tablet Take 1 tablet (4 mg total) by mouth every 8 (eight) hours as needed for nausea or vomiting. 10/13/20  Yes [name], MD  polyethylene glycol (MIRALAX / GLYCOLAX) 17 g packet Take 17 g by mouth daily.   Yes [provider]  sertraline (ZOLOFT) 100 MG tablet Take 100 mg by mouth in the morning. 10/05/20  Yes [name], MD  Simethicone 180 MG CAPS Take 1 capsule by mouth daily as needed (indigestion).   Yes [provider]  spironolactone (ALDACTONE) 25 MG tablet Take 1 tablet (25 mg total) by mouth daily. 09/29/20  Yes [name], MD  torsemide (DEMADEX) 20 MG tablet Take 20 mg by mouth daily as needed (fluid).   Yes [provider]  TURMERIC PO Take 1 tablet by mouth daily.   Yes [provider]  VITAMIN E PO Take 1 tablet by mouth daily.   Yes [provider]   VOLTAREN 1 % GEL APPLY ONE APPLICATION TOPICALLY 3 TIMES DAILY Patient taking differently: Apply 2 g topically 4 (four) times daily as needed (pain). 09/26/14  Yes [name], MD  Wheat Dextrin (BENEFIBER DRINK MIX PO) Take 15 mLs by mouth daily.   Yes [provider]  furosemide (LASIX) 20 MG tablet Take 1 tablet (20 mg total) by mouth as needed. Patient not taking: No sig reported 03/03/20   Kate Sable, MD  hydrocortisone (ANUSOL-HC) 2.5 % rectal cream Place 1 application rectally 2 (two) times daily. Patient not taking: Reported on 01/14/2021 10/23/18   [name], MD  methocarbamol (ROBAXIN) 500 MG tablet Take 1 tablet (500 mg total) by mouth every 8 (eight) hours as needed for muscle spasms. Patient not taking: Reported on 01/14/2021 03/18/19   Davonna Belling, MD  Mouthwash Compounding Base (MOUTH WASH-GP) LIQD Take 5 mLs by mouth every 6 (six) hours as needed. Patient not taking: Reported on 01/14/2021 09/15/20   Lin Landsman, MD  Multiple Vitamins-Minerals (PRESERVISION AREDS 2+MULTI VIT) CAPS Take 1 capsule by mouth daily. Patient not taking: Reported on 01/14/2021 12/22/19   [name], MD  omeprazole (PRILOSEC) 20 MG capsule Take 1 capsule (20 mg total) by mouth daily. Patient not taking: Reported on 01/14/2021 10/02/20   [name], MD  Plecanatide (TRULANCE) 3 MG TABS Take 1 tablet by mouth daily. Patient not taking: Reported on 01/14/2021 07/21/20   Lin Landsman, MD  promethazine-phenylephrine (PROMETHAZINE VC) 6.25-5 MG/5ML SYRP Take 5 mLs by mouth every 4 (four) hours as needed for congestion. Patient not taking: Reported on 01/14/2021 09/18/19   Faustino Congress, NP    Current Facility-Administered Medications  Medication Dose Route Frequency Provider Last Rate Last Admin   0.9 %  sodium chloride infusion (Manually program via Guardrails IV Fluids)   Intravenous Once Barb Merino, MD       0.9 %  sodium chloride infusion  (Manually program via Guardrails IV Fluids)   Intravenous Once Esterwood, Amy S, PA-C       0.9 %  sodium chloride infusion  Intravenous Continuous Kayleen Memos, DO 50 mL/hr at 01/14/21 2121 New Bag at 01/14/21 2121   acetaminophen (TYLENOL) tablet 650 mg  650 mg Oral Q6H PRN Kayleen Memos, DO   650 mg at 01/14/21 2119   amoxicillin-clavulanate (AUGMENTIN) 875-125 MG per tablet 1 tablet  1 tablet Oral Q12H Irene Pap N, DO   1 tablet at 01/14/21 2119   cholecalciferol (VITAMIN D3) tablet 1,000 Units  1,000 Units Oral Daily Kayleen Memos, DO   1,000 Units at 01/14/21 2120   clonazePAM (KLONOPIN) disintegrating tablet 0.25 mg  0.25 mg Oral QHS Irene Pap N, DO   0.25 mg at 01/14/21 2257   colchicine tablet 0.6 mg  0.6 mg Oral Daily PRN Kayleen Memos, DO       hydrALAZINE (APRESOLINE) injection 5 mg  5 mg Intravenous Q6H PRN Irene Pap N, DO   5 mg at 01/14/21 2257   HYDROmorphone (DILAUDID) injection 0.5 mg  0.5 mg Intravenous Q4H PRN Irene Pap N, DO       insulin aspart (novoLOG) injection 0-5 Units  0-5 Units Subcutaneous QHS Hall, Carole N, DO       insulin aspart (novoLOG) injection 0-9 Units  0-9 Units Subcutaneous TID WC Hall, Carole N, DO       irbesartan (AVAPRO) tablet 150 mg  150 mg Oral Daily Heloise Purpura, RPH       levothyroxine (SYNTHROID) tablet 100 mcg  100 mcg Oral Daily Kayleen Memos, DO   100 mcg at 01/15/21 3845   melatonin tablet 3 mg  3 mg Oral QHS PRN Kayleen Memos, DO       ondansetron [facility]) injection 4 mg  4 mg Intravenous Q6H PRN Irene Pap N, DO       oxyCODONE (Oxy IR/ROXICODONE) immediate release tablet 5 mg  5 mg Oral Q6H PRN Irene Pap N, DO       senna-docusate (Senokot-S) tablet 2 tablet  2 tablet Oral QHS PRN Irene Pap N, DO       sertraline (ZOLOFT) tablet 100 mg  100 mg Oral q AM Irene Pap N, DO   100 mg at 01/15/21 3646   spironolactone (ALDACTONE) tablet 25 mg  25 mg Oral Daily Heloise Purpura, [facility]        Allergies as of  01/14/2021 - Review Complete 01/14/2021  Allergen Reaction Noted   Amlodipine Other (See Comments) 05/16/2020   Prednisone Other (See Comments) 11/12/2015   Ace inhibitors Other (See Comments) 04/02/2008   Alendronate sodium  09/14/2006   Carvedilol Other (See Comments) 11/06/2020   Losartan Other (See Comments) 09/26/2014   Other Other (See Comments) 11/12/2015   Paroxetine hcl Other (See Comments) 06/15/2011   Risedronate sodium  03/13/2007   Silenor [doxepin hcl] Other (See Comments) 04/02/2013   Simvastatin  10/03/2009   Sulfonamide derivatives  06/08/2006   Toprol xl [metoprolol] Diarrhea 08/03/2019   Tramadol Other (See Comments) 11/12/2015   Influenza vaccines Rash and Other (See Comments) 05/24/2014    Family History  Problem Relation Age of Onset   Diabetes Brother        post-op   Coronary artery disease Brother    Diabetes Paternal Grandfather    Breast cancer Cousin     Social History   Socioeconomic History   Marital status: Married    Spouse name: Not on file   Number of children: 2   Years of education: Not on file   Highest education  level: Not on file  Occupational History   Occupation: Retired    Fish farm manager: RETIRED  Tobacco Use   Smoking status: Never   Smokeless tobacco: Never  Vaping Use   Vaping Use: Never used  Substance and Sexual Activity   Alcohol use: No   Drug use: No   Sexual activity: Not Currently  Other Topics Concern   Not on file  Social History Narrative   Left handed   Widow. Husband ([name]) deceased 03-28-16 from dementia. She was caregiver.    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  All other review of systems was otherwise negative.   Physical Exam: Vital signs in  last 24 hours: Temp:  [98.1 F (36.7 C)-99.3 F (37.4 C)] 98.6 F (37 C) (09/22 0842) Pulse Rate:  [50-100] 73 (09/22 0842) Resp:  [15-21] 16 (09/22 0842) BP: (124-188)/(55-90) 148/71 (09/22 0842) SpO2:  [96 %-100 %] 97 % (09/22 0842) Weight:  [72.2 kg] 72.2 kg (09/22 0410) Last BM Date: 01/06/21 General:   Alert,  Well-developed, well-nourished, elderly white female, somewhat hard of hearing, daughter at bedside ,pleasant and cooperative in NAD Head:  Normocephalic and atraumatic. Eyes:  Sclera clear, no icterus.   Conjunctiva pale Ears:  Normal auditory acuity. Nose:  No deformity, discharge,  or lesions. Mouth:  No deformity or lesions.   Neck:  Supple; no masses or thyromegaly. Lungs:  Clear throughout to auscultation.   No wheezes, crackles, or rhonchi.  Heart:  Regular rate and rhythm; no murmurs, clicks, rubs,  or gallops. Abdomen:  Soft, there is some tenderness in the left mid and left lower quadrant no guarding or rebound BS active,nonpalp mass or hsm.   Rectal:  not done Msk:  Symmetrical without gross deformities. . Pulses:  Normal pulses noted. Extremities:  Without clubbing or edema. Neurologic:  Alert and  oriented x4;  grossly normal neurologically. Skin:  Intact without significant lesions or rashes.. Psych:  Alert and cooperative. Normal mood and affect.    Lab Results: Recent Labs    01/14/21 0709 01/14/21 2100 01/15/21 0313 01/15/21 0832  WBC 7.3  --   --  5.4  HGB 8.3* 7.4* 7.4* 7.4*  HCT 26.0* 23.0* 22.8* 22.3*  PLT 267  --   --  227   BMET Recent Labs    01/14/21 0709  NA 130*  K 4.0  CL 96*  CO2 25  GLUCOSE 144*  BUN 12  CREATININE 1.02*  CALCIUM 9.3   LFT Recent Labs    01/14/21 0709  PROT 6.1*  ALBUMIN 3.3*  AST 19  ALT 14  ALKPHOS 75  BILITOT 0.6       IMPRESSION:  #54 87 year old white female admitted with weakness and a syncopal episode at home at which time she sustained a right orbital fracture and right maxillary  sinus fracture #2   1 week history of hematochezia in setting of severe obstipation and complaints of left-sided abdominal pain.  4 g drop in  hemoglobin, unclear whether this all occurred within the past week.  Etiology of bleeding and abdominal discomfort is not clear.  Rule out a segmental ischemic colitis, rule out possible stercoral ulceration, rule out smoldering diverticulitis.  #3  history of internal hemorrhoids, status post outpatient banding about 3 months ago #4 history of large external hemorrhoids and history of rectal prolapse #5 history of colon polyps #6 chronic significant constipation #7 chronic anxiety #8 hypertension and hyperlipidemia #9  chronic kidney disease  PLAN: Full liquid diet Schedule for CT of the abdomen pelvis with contrast today Continue serial hemoglobins Transfuse 1 unit of packed RBCs now, transfuse to keep hemoglobin closer to 8 as patient is quite symptomatic Further recommendations pending findings at CT. GI will follow with you   Amy Esterwood PA-C 01/15/2021, 9:59 AM     [facility] Attending   I have taken a history, reviewed the chart and examined the patient. I agree with the Advanced Practitioner's note, impression and recommendations.  Majority the medical decision-making in the formulation of the assessment and plan were performed by me.  CT not read by rads but I think rectal wall is thivk. Could be from prolapse.  Plan is flex sig tomorrow.  The risks and benefits as well as alternatives of endoscopic procedure(s) have been discussed and reviewed. All questions answered. The patient agrees to proceed.  Gatha Mayer, MD, [facility] 01/15/2021 4:35 PM

## 2021-01-15 NOTE — Evaluation (Signed)
Physical Therapy Evaluation Patient Details Name: Brooke Beasley MRN: [id number] DOB: [birth year] Today's Date: 01/15/2021  History of Present Illness  87yo female who presented on 9/21 wtih rectal bleeding, general weakness, multiple episodes of syncope. ADmitted with acute lower GIB, anemia, facial fractures. PMH anxiety, hx Cdiff, HTN, HLD, pseudo gout, total shoulder revision, joint replacement  Clinical Impression   Patient received in bed, pleasant but quite HOH. Able to follow commands well when she fully understood what was being asked of her, and able to mobilize well today. Mildly impulsive, but question if this is her baseline. Orthostatics negative. Left in bed with all needs met, bed alarm active and daughter present. Seems close to her functional baseline, and I fully anticipate that mobility will improve along with medical status. Will continue to follow in house but at this point likely will not need therapy f/u at [facility].         01/15/21 1210  Vital Signs  Patient Position (if appropriate) Orthostatic Vitals  Orthostatic Lying   BP- Lying 137/65  Pulse- Lying 67  Orthostatic Sitting  BP- Sitting 139/74  Pulse- Sitting 61  Orthostatic Standing at 0 minutes  BP- Standing at 0 minutes 131/61  Orthostatic Standing at 3 minutes  BP- Standing at 3 minutes 137/71     Recommendations for follow up therapy are one component of a multi-disciplinary discharge planning process, led by the attending physician.  Recommendations may be updated based on patient status, additional functional criteria and insurance authorization.  Follow Up Recommendations No PT follow up    Equipment Recommendations  None recommended by PT    Recommendations for Other Services       Precautions / Restrictions Precautions Precautions: Fall Restrictions Weight Bearing Restrictions: No      Mobility  Bed Mobility Overal bed mobility: Modified Independent             General bed  mobility comments: Use rail and HOB raised    Transfers Overall transfer level: Needs assistance Equipment used: Rolling walker (2 wheeled) Transfers: Sit to/from Stand Sit to Stand: Supervision         General transfer comment: impulsive but able to boost up to standing without difficulty  Ambulation/Gait Ambulation/Gait assistance: Min guard Gait Distance (Feet): 20 Feet Assistive device: Rolling walker (2 wheeled) Gait Pattern/deviations: WFL(Within Functional Limits);Step-through pattern Gait velocity: WNL   General Gait Details: safe and steady with RW; gait distance limited as she asked to stay in room so she would have time to finish prep drink for CT  Stairs            Wheelchair Mobility    Modified Rankin (Stroke Patients Only)       Balance Overall balance assessment: History of Falls                                           Pertinent Vitals/Pain Pain Assessment: Faces Faces Pain Scale: No hurt Pain Intervention(s): Monitored during session;Limited activity within patient's tolerance    Home Living Family/patient expects to be discharged to:: Private residence Living Arrangements: Children;Other (Comment) (daughter and son in law) Available Help at Discharge: Family;Available PRN/intermittently Type of Home: House Home Access: Stairs to enter   Entrance Stairs-Number of Steps: 1 Home Layout: Able to live on main level with bedroom/bathroom Home Equipment: Bedside commode;Shower seat Additional Comments: Pts daughter lives with  her    Prior Function Level of Independence: Needs assistance         Comments: Before admission, pt was living at her home with daughter, taking care of her chickens, doing light yard work, independent in ADLs, light IADLs, and daughter assisted with heavy IADLs. Pt reported she did not use an assistive device     Hand Dominance   Dominant Hand: Left    Extremity/Trunk Assessment   Upper  Extremity Assessment Upper Extremity Assessment: Defer to OT evaluation    Lower Extremity Assessment Lower Extremity Assessment: Generalized weakness    Cervical / Trunk Assessment Cervical / Trunk Assessment: Normal  Communication   Communication: HOH  Cognition Arousal/Alertness: Awake/alert Behavior During Therapy: WFL for tasks assessed/performed Overall Cognitive Status: Within Functional Limits for tasks assessed                                 General Comments: HOH, pleasant and cooperative when she understands what is being said      General Comments General comments (skin integrity, edema, etc.): Daughter present during session    Exercises     Assessment/Plan    PT Assessment Patient needs continued PT services  PT Problem List Decreased strength;Decreased knowledge of use of DME;Decreased activity tolerance;Decreased safety awareness;Decreased balance;Decreased mobility       PT Treatment Interventions DME instruction;Balance training;Gait training;Stair training;Functional mobility training;Patient/family education;Therapeutic activities;Therapeutic exercise    PT Goals (Current goals can be found in the Care Plan section)  Acute Rehab PT Goals Patient Stated Goal: To go home PT Goal Formulation: With patient/family Time For Goal Achievement: 01/29/21 Potential to Achieve Goals: Good    Frequency Min 3X/week   Barriers to discharge        Co-evaluation               AM-PAC PT "6 Clicks" Mobility  Outcome Measure Help needed turning from your back to your side while in a flat bed without using bedrails?: None Help needed moving from lying on your back to sitting on the side of a flat bed without using bedrails?: None Help needed moving to and from a bed to a chair (including a wheelchair)?: None Help needed standing up from a chair using your arms (e.g., wheelchair or bedside chair)?: None Help needed to walk in hospital room?: A  Little Help needed climbing 3-5 steps with a railing? : A Little 6 Click Score: 22    End of Session   Activity Tolerance: Patient tolerated treatment well Patient left: in bed;with call bell/phone within reach;with bed alarm set;with family/visitor present Nurse Communication: Mobility status PT Visit Diagnosis: Muscle weakness (generalized) (M62.81);History of falling (Z91.81)    Time: 1201-1230 PT Time Calculation (min) (ACUTE ONLY): 29 min   Charges:   PT Evaluation $PT Eval Moderate Complexity: 1 Mod PT Treatments $Gait Training: 8-22 mins       Windell Norfolk, DPT, PN2   Supplemental Physical Therapist [place]    Pager [phone] Acute Rehab Office [phone]

## 2021-01-16 ENCOUNTER — Encounter (HOSPITAL_COMMUNITY): Payer: Self-pay | Admitting: Internal Medicine

## 2021-01-16 ENCOUNTER — Encounter (HOSPITAL_COMMUNITY)
Admission: EM | Disposition: A | Discharge status: Home Health | Payer: Self-pay | Source: Home / Self Care | Attending: Internal Medicine

## 2021-01-16 ENCOUNTER — Encounter: Payer: Self-pay | Admitting: Gastroenterology

## 2021-01-16 DIAGNOSIS — K922 Gastrointestinal hemorrhage, unspecified: Secondary | ICD-10-CM | POA: Diagnosis not present

## 2021-01-16 DIAGNOSIS — K6289 Other specified diseases of anus and rectum: Secondary | ICD-10-CM

## 2021-01-16 DIAGNOSIS — K625 Hemorrhage of anus and rectum: Secondary | ICD-10-CM

## 2021-01-16 DIAGNOSIS — D62 Acute posthemorrhagic anemia: Secondary | ICD-10-CM

## 2021-01-16 HISTORY — PX: FLEXIBLE SIGMOIDOSCOPY: SHX5431

## 2021-01-16 LAB — CBC
HCT: 25.4 % — ABNORMAL LOW (ref 36.0–46.0)
Hemoglobin: 8.5 g/dL — ABNORMAL LOW (ref 12.0–15.0)
MCH: 27.9 pg (ref 26.0–34.0)
MCHC: 33.5 g/dL (ref 30.0–36.0)
MCV: 83.3 fL (ref 80.0–100.0)
Platelets: 232 10*3/uL (ref 150–400)
RBC: 3.05 MIL/uL — ABNORMAL LOW (ref 3.87–5.11)
RDW: 15.7 % — ABNORMAL HIGH (ref 11.5–15.5)
WBC: 6.3 10*3/uL (ref 4.0–10.5)
nRBC: 0 % (ref 0.0–0.2)

## 2021-01-16 LAB — GLUCOSE, CAPILLARY: Glucose-Capillary: 100 mg/dL — ABNORMAL HIGH (ref 70–99)

## 2021-01-16 LAB — BPAM RBC
Blood Product Expiration Date: 202209262359
ISSUE DATE / TIME: 202209221052
Unit Type and Rh: 9500

## 2021-01-16 LAB — TYPE AND SCREEN
ABO/RH(D): O NEG
Antibody Screen: NEGATIVE
Unit division: 0

## 2021-01-16 LAB — URINALYSIS, ROUTINE W REFLEX MICROSCOPIC
Bilirubin Urine: NEGATIVE
Glucose, UA: NEGATIVE mg/dL
Hgb urine dipstick: NEGATIVE
Ketones, ur: NEGATIVE mg/dL
Leukocytes,Ua: NEGATIVE
Nitrite: NEGATIVE
Protein, ur: NEGATIVE mg/dL
Specific Gravity, Urine: 1.006 (ref 1.005–1.030)
pH: 7 (ref 5.0–8.0)

## 2021-01-16 SURGERY — SIGMOIDOSCOPY, FLEXIBLE
Anesthesia: Moderate Sedation

## 2021-01-16 MED ORDER — LACTATED RINGERS IV SOLN
INTRAVENOUS | Status: AC | PRN
  Administered 2021-01-16: 1000 mL via INTRAVENOUS

## 2021-01-16 MED ORDER — AMOXICILLIN-POT CLAVULANATE 875-125 MG PO TABS: 1.0000 | ORAL_TABLET | Freq: Two times a day (BID) | ORAL | 0 refills | Status: AC

## 2021-01-16 MED ORDER — FENTANYL CITRATE (PF) 100 MCG/2ML IJ SOLN
INTRAMUSCULAR | Status: AC
  Filled 2021-01-16: qty 4

## 2021-01-16 MED ORDER — DULCOLAX 5 MG PO TBEC: 5.0000 mg | DELAYED_RELEASE_TABLET | Freq: Every day | ORAL | 1 refills | Status: AC | PRN

## 2021-01-16 MED ORDER — DOCUSATE SODIUM 100 MG PO CAPS: 100.0000 mg | ORAL_CAPSULE | Freq: Two times a day (BID) | ORAL | 0 refills | Status: DC

## 2021-01-16 MED ORDER — MIDAZOLAM HCL (PF) 5 MG/ML IJ SOLN
INTRAMUSCULAR | Status: AC
  Filled 2021-01-16: qty 2

## 2021-01-16 NOTE — Care Management Important Message (Signed)
Important Message  Patient Details  Name: Brooke Beasley MRN: [id number] Date of Birth: [birth year]   Medicare Important Message Given:  Yes     [name] 01/16/2021, 4:13 PM

## 2021-01-16 NOTE — TOC CAGE-AID Note (Signed)
Transition of Care [facility]) - CAGE-AID Screening   Patient Details  Name: Brooke Beasley MRN: [id number] Date of Birth: [birth year]  Transition of Care [facility]) CM/SW Contact:    Jarquavious Fentress C Tarpley-Carter, LCSWA Phone Number: 01/16/2021, 11:05 AM   Clinical Narrative: Pt participated in [place].  Pt stated she does not use substance or ETOH.  Pt was not offered resources, due to no usage of substance or ETOH.     Kha Hari Tarpley-Carter, MSW, LCSW-A Pronouns:  She/Her/Hers [facility] Clinical Social Worker Direct Number:  [phone] [email or web address]   CAGE-AID Screening:    Have You Ever Felt You Ought to Cut Down on Your Drinking or Drug Use?: No Have People Annoyed You By [employer] Your Drinking Or Drug Use?: No Have You Felt Bad Or Guilty About Your Drinking Or Drug Use?: No Have You Ever Had a Drink or Used Drugs First Thing In The Morning to Steady Your Nerves or to Get Rid of a Hangover?: No CAGE-AID Score: 0  Substance Abuse Education Offered: No

## 2021-01-16 NOTE — Progress Notes (Signed)
PT Cancellation Note  Patient Details Name: Naidelyn Parrella MRN: [id number] DOB: 1930-12-08   Cancelled Treatment:    Reason Eval/Treat Not Completed: Patient at procedure or test/unavailable (Pt in Endoscopy.  Will return at later date.)   Alvira Philips 01/16/2021, 9:27 AM [facility] M,PT Acute Rehab Services [phone] (pager)

## 2021-01-16 NOTE — Op Note (Signed)
[facility] Patient Name: Brooke Beasley Procedure Date : 01/16/2021 MRN: [id number] Attending MD: Gatha Mayer , MD Date of Birth: 10/31/1930 CSN: [id number] Age: 87 Admit Type: Inpatient Procedure:                Flexible Sigmoidoscopy Indications:              Rectal hemorrhage Providers:                Gatha Mayer, MD, Particia Nearing, RN, Cletis Athens,                            Technician Referring MD:              Medicines:                None Complications:            No immediate complications. Estimated Blood Loss:     Estimated blood loss: none. Procedure:                Pre-Anesthesia Assessment:                           - Prior to the procedure, a History and Physical                            was performed, and patient medications and                            allergies were reviewed. The patient's tolerance of                            previous anesthesia was also reviewed. The risks                            and benefits of the procedure and the sedation                            options and risks were discussed with the patient.                            All questions were answered, and informed consent                            was obtained. Prior Anticoagulants: The patient has                            taken no previous anticoagulant or antiplatelet                            agents. ASA Grade Assessment: III - A patient with                            severe systemic disease. After reviewing the risks                            and  benefits, the patient was deemed in                            satisfactory condition to undergo the procedure.                           After obtaining informed consent, the scope was                            passed under direct vision. The PCF-HQ190TL                            ([id number]) Olympus peds colonoscope was introduced                            through the anus and advanced to the the sigmoid                             colon. The flexible sigmoidoscopy was accomplished                            without difficulty. The patient tolerated the                            procedure well. The quality of the bowel                            preparation was good. Scope In: Scope Out: Findings:      The perianal and digital rectal examinations were normal.      Internal hemorrhoids were found.      A scar was found in the rectum. The scar tissue was healthy in       appearance.      Many small and large-mouthed diverticula were found in the sigmoid colon. Impression:               - Internal hemorrhoids.                           - Scar in the rectum. From hemorrhoid banding                           - Diverticulosis in the sigmoid colon.                           - No specimens collected.                           - Cause of bleeding not clear. Based upon recent hx                            sounds like she might have been impacted so rectal                            trauma may have caused bleeding. However no ulcer  ther as often seen. Hemorrhoids could have bled but                            decrease in Hgb more likely correlates with a                            diverticular bleed that has stopped. Recommendation:           - Return patient to hospital ward for ongoing care.                           - GI signing off                           recommend continue at home bowel regimen and f/u                            Dr. Rexford Maus her primary GI MD Procedure Code(s):        --- Professional ---                           [phone], Sigmoidoscopy, flexible; diagnostic,                            including collection of specimen(s) by brushing or                            washing, when performed (separate procedure) Diagnosis Code(s):        --- Professional ---                           K62.89, Other specified diseases of anus and rectum                            K64.8, Other hemorrhoids                           K62.5, Hemorrhage of anus and rectum                           K57.30, Diverticulosis of large intestine without                            perforation or abscess without bleeding CPT copyright 2019 [employer]. All rights reserved. The codes documented in this report are preliminary and upon coder review may  be revised to meet current compliance requirements. Gatha Mayer, MD 01/16/2021 9:41:30 AM This report has been signed electronically. Number of Addenda: 0

## 2021-01-18 NOTE — Discharge Summary (Signed)
[facility] Discharge Summary   Patient: Brooke Beasley OEV:[id number]  PCP: [name], MD  Date of admission: 01/14/2021   Date of discharge: 01/16/2021       Discharge Diagnoses:  Principal diagnosis Lower GI bleed likely diverticular  Active Problems:   Lower GI bleed   Admitted From: Home Disposition:  Home   Recommendations for Outpatient Follow-up:  PCP: Follow-up with PCP in 1 week. Follow up LABS/TEST: CBC   Follow-up Information     [name], MD. Schedule an appointment as soon as possible for a visit in 1 week(s).   Specialty: Family Medicine Contact information: [place] [address] [phone]         Michael Litter, DMD. Schedule an appointment as soon as possible for a visit in 1 week(s).   Specialty: Dentistry Contact information: [place] [address] [phone]                Discharge Instructions     Diet - low sodium heart healthy   Complete by: As directed    Increase activity slowly   Complete by: As directed        Diet recommendation: Cardiac diet  Activity: The patient is advised to gradually reintroduce usual activities, as tolerated  Discharge Condition: stable  Code Status: Full code   History of present illness: As per the H and P dictated on admission, " Brooke Beasley is a 87 y.o. female with medical history significant for chronic constipation, rectal prolapse, internal hemorrhoids post banding 3 months ago, diverticulosis, followed outpatient by [facility], essential hypertension, hyperlipidemia, chronic anxiety/depression, who presented to [facility] ED from home via EMS due to rectal bleed, generalized weakness, and passing out.  Patient reports struggling with constipation for the past 2 weeks with intermittent bright red blood per rectum and watery stools.  Last night she took her GoLytely prescription sent by her GI provider and followed  the instructions.  As a result she had large liquid stools mixed with blood throughout the night.  Her daughter and son-in-law who live with her were monitoring her overnight.  After having a large watery stool she stood up at the sink to wash her hand, felt dizzy and passed out.  She hit the right side of her face.  Per her daughter she was out for less than a minute.  Her daughter sat her on a chair and she passed out again for less than a minute.  EMS was activated.  Patient reports associated generalized weakness for the past 1 to 2 weeks.  Also reports nocturia.  Denies dysuria.  No melena.  No abdominal pain, nausea or vomiting at the time of this visit.  No use of NSAIDs.  Endorses a minimal headache after hitting her face.  Upon presentation to the ED hemoglobin noted to have dropped from 12 at baseline to 8.  No recurrent bowel movement while in the ED.  CT head and maxillofacial revealed no acute intracranial abnormalities.  Acute fractures of the right orbital bones and maxillary antral walls with depression of some fracture fragments.  Associated periorbital and right zygomatic hematomas.  Anterior subluxation of C4 on C5 is new since her prior comparison study.  CT cervical spine is suggested to exclude cervical spine injury.  EDP contacted ENT who provided recommendations and will follow-up outpatient.  EDP consulted [facility], Dr. Fuller Plan, who will see in the morning.  [facility] asked to  admit."  Hospital Course:  Summary of her active problems in the hospital is as following. Severe symptomatic anemia acute blood loss acute lower GI bleeding in the setting of diverticulosis and hemorrhoids,  exacerbated by recent constipation and large volume laxatives Baseline hemoglobin 12.9-presentation 8.3-repeat hemoglobin 7.4. Due to significant symptoms, she received blood transfusion.  1 unit of PRBC. Known diverticular disease and now is stabilizing.   No clinical bleeding since admission. CT scan with  contrast shows no significant abnormality. Flexible sigmoidoscopy also negative for any acute bleeding. Outpatient follow-up with primary GI recommended.   Right orbital fracture: Stable.  Oral surgery by ER who recommended Augmentin for 5 days and outpatient follow-up with Dr. Mancel Parsons.  Pain controlled.  Mobility with PT OT.  Generalized weakness and syncope:  Due to acute blood loss.  Work with PT OT.   Anxiety and depression: Chronic. She is on Zoloft and Klonopin that she will continue.  Hypothyroidism:  On Synthroid.  Patient was seen by physical therapy, who recommended Home Health,. On the day of the discharge the patient's vitals were stable, and no other new acute medical condition were reported. The patient was felt safe to be discharge at Home with Home health.  Consultants: Gastroenterology  Procedures: Flexible sigmoidoscopy  DISCHARGE MEDICATION: Allergies as of 01/16/2021       Reactions   Amlodipine Other (See Comments)   Stomach pains--"feel like my insides are on fire"   Prednisone Other (See Comments)   Heart racing, anxiety --> clarified no anaphylaxis to prednisone. Tolerated prednisone 02/2019 without issue   Ace Inhibitors Other (See Comments)   cough   Alendronate Sodium    REACTION: palpitations   Carvedilol Other (See Comments)   fatigue   Losartan Other (See Comments)   Cr bumped   Other Other (See Comments)   No seeds or corn because of IBS   Paroxetine Hcl Other (See Comments)   Not effective - felt ill on this medicine   Risedronate Sodium    REACTION: joint pain   Silenor [doxepin Hcl] Other (See Comments)   Hallucinations and racing heart   Simvastatin    REACTION: the full 20 mg pill causes leg pain- can tol 10 mg   Sulfonamide Derivatives    REACTION: rash   Toprol Xl [metoprolol] Diarrhea   Diarrhea and weakness   Tramadol Other (See Comments)   Felt like passing out   Influenza Vaccines Rash, Other (See Comments)   Led to mental  breakdown in 1960s        Medication List     STOP taking these medications    furosemide 20 MG tablet Commonly known as: LASIX   hydrocortisone 2.5 % rectal cream Commonly known as: Anusol-HC   methocarbamol 500 MG tablet Commonly known as: ROBAXIN   Mouth Wash-GP Liqd   omeprazole 20 MG capsule Commonly known as: PRILOSEC   PreserVision AREDS 2+Multi Vit Caps   Trulance 3 MG Tabs Generic drug: Plecanatide       TAKE these medications    acetaminophen 500 MG tablet Commonly known as: TYLENOL Take 500-1,000 mg by mouth every 6 (six) hours as needed for moderate pain. Depends on pain   albuterol 108 (90 Base) MCG/ACT inhaler Commonly known as: VENTOLIN HFA Inhale 2 puffs into the lungs every 6 (six) hours as needed for wheezing or shortness of breath.   amoxicillin-clavulanate 875-125 MG tablet Commonly known as: AUGMENTIN Take 1 tablet by mouth every 12 (twelve) hours for 3  days.   BENEFIBER DRINK MIX PO Take 15 mLs by mouth daily.   bimatoprost 0.01 % Soln Commonly known as: LUMIGAN Place 1 drop into both eyes at bedtime.   cholecalciferol 1000 units tablet Commonly known as: VITAMIN D Take 1,000 Units by mouth daily.   clonazePAM 0.5 MG tablet Commonly known as: KLONOPIN TAKE 1 TABLET BY MOUTH AT BEDTIME   colchicine 0.6 MG tablet Take 1 tablet (0.6 mg total) by mouth daily as needed (gout flare). What changed: when to take this   denosumab 60 MG/ML Sosy injection Commonly known as: PROLIA Inject 60 mg into the skin every 6 (six) months.   docusate sodium 100 MG capsule Commonly known as: Colace Take 1 capsule (100 mg total) by mouth 2 (two) times daily.   Dulcolax 5 MG EC tablet Generic drug: bisacodyl Take 1 tablet (5 mg total) by mouth daily as needed for moderate constipation.   Euthyrox 100 MCG tablet Generic drug: levothyroxine Take 1 tablet by mouth once daily   FIBER CHOICE PO Take 1 tablet by mouth daily.   hydrocortisone  2.5 % cream Place 1 application rectally daily as needed (hemorrhoids).   irbesartan 150 MG tablet Commonly known as: Avapro Take 1 tablet (150 mg total) by mouth daily.   ondansetron 4 MG tablet Commonly known as: Zofran Take 1 tablet (4 mg total) by mouth every 8 (eight) hours as needed for nausea or vomiting.   OSTEO BI-FLEX TRIPLE STRENGTH PO Take 1 tablet by mouth daily. With Vit D   PEPCID COMPLETE PO Take 1 tablet by mouth daily as needed (indigestion).   polyethylene glycol 17 g packet Commonly known as: MIRALAX / GLYCOLAX Take 17 g by mouth daily.   promethazine-phenylephrine 6.25-5 MG/5ML Syrp Commonly known as: PROMETHAZINE VC Take 5 mLs by mouth every 4 (four) hours as needed for congestion.   sertraline 100 MG tablet Commonly known as: ZOLOFT Take 100 mg by mouth in the morning.   Simethicone 180 MG Caps Take 1 capsule by mouth daily as needed (indigestion).   spironolactone 25 MG tablet Commonly known as: ALDACTONE Take 1 tablet (25 mg total) by mouth daily.   torsemide 20 MG tablet Commonly known as: DEMADEX Take 20 mg by mouth daily as needed (fluid).   TURMERIC PO Take 1 tablet by mouth daily.   Vitamin C Chew Chew 1 tablet by mouth daily.   VITAMIN E PO Take 1 tablet by mouth daily.   Voltaren 1 % Gel Generic drug: diclofenac Sodium APPLY ONE APPLICATION TOPICALLY 3 TIMES DAILY What changed: See the new instructions.        Discharge Exam: Filed Weights   01/14/21 0649 01/15/21 0410  Weight: 72.6 kg 72.2 kg   Vitals:   01/16/21 0950 01/16/21 1100  BP: (!) 190/74 (!) 124/100  Pulse: (!) 58 73  Resp: 19 20  Temp:  98.4 F (36.9 C)  SpO2: 97% 100%   General: Appear in mild distress, no Rash; Oral Mucosa Clear, moist. no Abnormal Neck Mass Or lumps, Conjunctiva normal  Cardiovascular: S1 and S2 Present, no Murmur, Respiratory: good respiratory effort, Bilateral Air entry present and CTA, no Crackles, no wheezes Abdomen: Bowel  Sound present, Soft and no tenderness Extremities: no Pedal edema Neurology: alert and oriented to time, place, and person affect appropriate. no new focal deficit Gait not checked due to patient safety concerns  The results of significant diagnostics from this hospitalization (including imaging, microbiology, ancillary and laboratory) are listed  below for reference.    Significant Diagnostic Studies: CT HEAD WO CONTRAST (5MM)  Result Date: 01/14/2021 CLINICAL DATA:  Facial trauma EXAM: CT HEAD WITHOUT CONTRAST CT MAXILLOFACIAL WITHOUT CONTRAST TECHNIQUE: Multidetector CT imaging of the head and maxillofacial structures were performed using the standard protocol without intravenous contrast. Multiplanar CT image reconstructions of the maxillofacial structures were also generated. COMPARISON:  04/08/2012 FINDINGS: CT HEAD FINDINGS Brain: No evidence of acute infarction, hemorrhage, hydrocephalus, extra-axial collection or mass lesion/mass effect. Mild cerebral atrophy. Low-attenuation changes in the deep white matter consistent with small vessel ischemia. Basal ganglia calcifications. Old cerebellar infarcts. Vascular: Moderate intracranial arterial vascular calcifications. Skull: Calvarium appears intact. No acute depressed skull fractures. Other: None. CT MAXILLOFACIAL FINDINGS Osseous: Depressed fractures of the inferior right orbital rim with mild fat herniation. Nondisplaced fractures of the right lateral orbital rim. Mildly depressed fractures of the right anterior and lateral maxillary antral walls. Nasal bones, nasal septum, pterygoid plates, left facial bones, mandibles, and temporomandibular joints appear intact. Incidental note of about 5 mm anterior subluxation of C4 on C5, new since the previous study. Cervical spine injury is not excluded and cervical spine CT is suggested. Degenerative changes in the temporomandibular joints. Orbits: The globes and extraocular muscles appear intact and  symmetrical. Mild periorbital soft tissue hematoma on the right. Sinuses: Opacification of the right maxillary antrum and some of the right ethmoid air cells. Mastoid air cells are clear. Soft tissues: Soft tissue swelling/hematoma over the right zygomatic region. IMPRESSION: 1. No acute intracranial abnormalities. Mild chronic atrophy and small vessel ischemic changes. 2. Acute fractures of the right orbital bones and maxillary antral walls with depression of some fracture fragments. Associated periorbital and right zygomatic hematomas. 3. Anterior subluxation of C4 on C5 is new since a prior comparison study. CT cervical spine is suggested to exclude cervical spine injury. Electronically Signed   By: Lucienne Capers M.D.   On: 01/14/2021 19:12   CT Cervical Spine Wo Contrast  Result Date: 01/14/2021 CLINICAL DATA:  Fall with trauma to the head and neck. EXAM: CT CERVICAL SPINE WITHOUT CONTRAST TECHNIQUE: Multidetector CT imaging of the cervical spine was performed without intravenous contrast. Multiplanar CT image reconstructions were also generated. COMPARISON:  None. FINDINGS: Alignment: Exaggeration of the cervical lordosis in the upper cervical region. 2 mm degenerative anterolisthesis at C4-5. Straightening of the cervical spine from C5-C7. No traumatic malalignment. Skull base and vertebrae: No regional fracture or focal bone lesion. Soft tissues and spinal canal: No traumatic soft tissue finding. Extensive calcification at the carotid artery bifurcations. Disc levels: Chronic arthritis at the C1-2 articulation. No encroachment upon the neural spaces. C2-3: Bulging of the disc.  No compressive stenosis. C3-4: Endplate osteophytes. Bilateral facet osteoarthritis. Mild bilateral foraminal narrowing. C4-5: Facet osteoarthritis with 2 mm of anterolisthesis. No compressive canal or foraminal narrowing. C5-6: Endplate osteophytes. Mild bilateral bony foraminal narrowing. C6-7: Disc space narrowing with  endplate osteophytes. No compressive foraminal narrowing. C7-T1: Mild facet osteoarthritis.  No stenosis. Upper chest: Negative Other: None IMPRESSION: No acute or traumatic finding. Chronic degenerative changes of the cervical spine as outlined above. No compressive canal stenosis suspected. Electronically Signed   By: Nelson Chimes M.D.   On: 01/14/2021 20:29   CT ABDOMEN PELVIS W CONTRAST  Result Date: 01/15/2021 CLINICAL DATA:  Left lower quadrant abdominal pain. EXAM: CT ABDOMEN AND PELVIS WITH CONTRAST TECHNIQUE: Multidetector CT imaging of the abdomen and pelvis was performed using the standard protocol following bolus administration of intravenous contrast.  CONTRAST:  67mL OMNIPAQUE IOHEXOL 350 MG/ML SOLN COMPARISON:  CT virtual colonoscopy 08/11/2016. FINDINGS: Lower chest: There is some reticular opacities in both lung bases, new from prior. Heart is enlarged. Hepatobiliary: No focal liver abnormality is seen. No gallstones, gallbladder wall thickening, or biliary dilatation. Pancreas: Unremarkable. No pancreatic ductal dilatation or surrounding inflammatory changes. Spleen: Normal in size without focal abnormality. Adrenals/Urinary Tract: The bladder, right kidney and bilateral adrenal glands are within normal limits. There is left renal atrophy and cortical scarring as seen on the prior examination. Stomach/Bowel: Stomach is within normal limits. Appendix appears normal. No evidence of bowel wall thickening, distention, or inflammatory changes. There is sigmoid colon diverticulosis without evidence for acute diverticulitis. Vascular/Lymphatic: Aortic atherosclerosis. No enlarged abdominal or pelvic lymph nodes. Again seen are 4 periphery calcified splenic artery aneurysms measuring up to 1 cm, similar to the prior study. Reproductive: Uterus and bilateral adnexa are unremarkable. Other: There is a small fat containing umbilical hernia. There is no ascites. Musculoskeletal: Multilevel degenerative  changes affect the spine. There is dextroconvex curvature of the lumbar spine, unchanged. IMPRESSION: No acute localizing process in the abdomen or pelvis. Colonic diverticulosis without evidence for diverticulitis. Aortic Atherosclerosis (ICD10-I70.0). Stable calcified splenic artery aneurysms measuring up to 1 cm. Electronically Signed   By: Ronney Asters M.D.   On: 01/15/2021 16:51   CT Maxillofacial Wo Contrast  Result Date: 01/14/2021 CLINICAL DATA:  Facial trauma EXAM: CT HEAD WITHOUT CONTRAST CT MAXILLOFACIAL WITHOUT CONTRAST TECHNIQUE: Multidetector CT imaging of the head and maxillofacial structures were performed using the standard protocol without intravenous contrast. Multiplanar CT image reconstructions of the maxillofacial structures were also generated. COMPARISON:  04/08/2012 FINDINGS: CT HEAD FINDINGS Brain: No evidence of acute infarction, hemorrhage, hydrocephalus, extra-axial collection or mass lesion/mass effect. Mild cerebral atrophy. Low-attenuation changes in the deep white matter consistent with small vessel ischemia. Basal ganglia calcifications. Old cerebellar infarcts. Vascular: Moderate intracranial arterial vascular calcifications. Skull: Calvarium appears intact. No acute depressed skull fractures. Other: None. CT MAXILLOFACIAL FINDINGS Osseous: Depressed fractures of the inferior right orbital rim with mild fat herniation. Nondisplaced fractures of the right lateral orbital rim. Mildly depressed fractures of the right anterior and lateral maxillary antral walls. Nasal bones, nasal septum, pterygoid plates, left facial bones, mandibles, and temporomandibular joints appear intact. Incidental note of about 5 mm anterior subluxation of C4 on C5, new since the previous study. Cervical spine injury is not excluded and cervical spine CT is suggested. Degenerative changes in the temporomandibular joints. Orbits: The globes and extraocular muscles appear intact and symmetrical. Mild  periorbital soft tissue hematoma on the right. Sinuses: Opacification of the right maxillary antrum and some of the right ethmoid air cells. Mastoid air cells are clear. Soft tissues: Soft tissue swelling/hematoma over the right zygomatic region. IMPRESSION: 1. No acute intracranial abnormalities. Mild chronic atrophy and small vessel ischemic changes. 2. Acute fractures of the right orbital bones and maxillary antral walls with depression of some fracture fragments. Associated periorbital and right zygomatic hematomas. 3. Anterior subluxation of C4 on C5 is new since a prior comparison study. CT cervical spine is suggested to exclude cervical spine injury. Electronically Signed   By: Lucienne Capers M.D.   On: 01/14/2021 19:12    Microbiology: Recent Results (from the past 240 hour(s))  Resp Panel by RT-PCR (Flu A&B, Covid) Nasopharyngeal Swab     Status: None   Collection Time: 01/14/21  5:27 PM   Specimen: Nasopharyngeal Swab; Nasopharyngeal(NP) swabs in vial transport medium  Result Value Ref Range Status   SARS Coronavirus 2 by RT PCR NEGATIVE NEGATIVE Final    Comment: (NOTE) SARS-CoV-2 target nucleic acids are NOT DETECTED.  The SARS-CoV-2 RNA is generally detectable in upper respiratory specimens during the acute phase of infection. The lowest concentration of SARS-CoV-2 viral copies this assay can detect is 138 copies/mL. A negative result does not preclude SARS-Cov-2 infection and should not be used as the sole basis for treatment or other patient management decisions. A negative result may occur with  improper specimen collection/handling, submission of specimen other than nasopharyngeal swab, presence of viral mutation(s) within the areas targeted by this assay, and inadequate number of viral copies(<138 copies/mL). A negative result must be combined with clinical observations, patient history, and epidemiological information. The expected result is Negative.  Fact Sheet for  Patients:  EntrepreneurPulse.com.au  Fact Sheet for Healthcare Providers:  IncredibleEmployment.be  This test is no t yet approved or cleared by the [place] FDA and  has been authorized for detection and/or diagnosis of SARS-CoV-2 by FDA under an Emergency Use Authorization (EUA). This EUA will remain  in effect (meaning this test can be used) for the duration of the COVID-19 declaration under Section 564(b)(1) of the Act, 21 U.S.C.section 360bbb-3(b)(1), unless the authorization is terminated  or revoked sooner.       Influenza A by PCR NEGATIVE NEGATIVE Final   Influenza B by PCR NEGATIVE NEGATIVE Final    Comment: (NOTE) The Xpert Xpress SARS-CoV-2/FLU/RSV plus assay is intended as an aid in the diagnosis of influenza from Nasopharyngeal swab specimens and should not be used as a sole basis for treatment. Nasal washings and aspirates are unacceptable for Xpert Xpress SARS-CoV-2/FLU/RSV testing.  Fact Sheet for Patients: EntrepreneurPulse.com.au  Fact Sheet for Healthcare Providers: IncredibleEmployment.be  This test is not yet approved or cleared by the [place] FDA and has been authorized for detection and/or diagnosis of SARS-CoV-2 by FDA under an Emergency Use Authorization (EUA). This EUA will remain in effect (meaning this test can be used) for the duration of the COVID-19 declaration under Section 564(b)(1) of the Act, 21 U.S.C. section 360bbb-3(b)(1), unless the authorization is terminated or revoked.  Performed at [facility], [place] [address] [place], [place] [address]      Labs: CBC: Recent Labs  Lab 01/14/21 [phone] 01/14/21 2100 01/15/21 0313 01/15/21 0832 01/15/21 1506 01/15/21 1937 01/16/21 0234  WBC 7.3  --   --  5.4  --   --  6.3  HGB 8.3*   < > 7.4* 7.4* 8.4* 8.2* 8.5*  HCT 26.0*   < > 22.8* 22.3* 25.1* 25.1* 25.4*  MCV 85.8  --   --  84.2  --   --  83.3   PLT 267  --   --  227  --   --  232   < > = values in this interval not displayed.   Basic Metabolic Panel: Recent Labs  Lab 01/14/21 0709 01/15/21 0832  NA 130* 132*  K 4.0 3.9  CL 96* 101  CO2 25 24  GLUCOSE 144* 109*  BUN 12 8  CREATININE 1.02* 0.93  CALCIUM 9.3 8.6*  MG  --  1.9   Liver Function Tests: Recent Labs  Lab 01/14/21 0709  AST 19  ALT 14  ALKPHOS 75  BILITOT 0.6  PROT 6.1*  ALBUMIN 3.3*   CBG: Recent Labs  Lab 01/15/21 0818 01/15/21 1227 01/15/21 1649 01/15/21 2032 01/16/21 0801  GLUCAP  104* 100* 105* 115* 100*    Time spent: 35 minutes  Signed:  Berle Mull  [facility] 01/16/2021

## 2021-01-19 ENCOUNTER — Encounter (HOSPITAL_COMMUNITY): Payer: Self-pay | Admitting: Internal Medicine

## 2021-01-20 ENCOUNTER — Other Ambulatory Visit: Payer: Self-pay | Admitting: Family Medicine

## 2021-01-20 NOTE — Telephone Encounter (Signed)
Refill request Clonazepam Last refill  09/21/20 #30/3 Last office visit video 10/13/20 Upcoming appointment 03/31/21

## 2021-01-21 NOTE — Telephone Encounter (Signed)
ERx 

## 2021-01-24 ENCOUNTER — Encounter: Payer: Self-pay | Admitting: Family Medicine

## 2021-01-27 DIAGNOSIS — D62 Acute posthemorrhagic anemia: Secondary | ICD-10-CM | POA: Diagnosis not present

## 2021-01-28 ENCOUNTER — Telehealth: Payer: Self-pay

## 2021-01-28 LAB — CBC
Hematocrit: 28.5 % — ABNORMAL LOW (ref 34.0–46.6)
Hemoglobin: 8.7 g/dL — ABNORMAL LOW (ref 11.1–15.9)
MCH: 26.3 pg — ABNORMAL LOW (ref 26.6–33.0)
MCHC: 30.5 g/dL — ABNORMAL LOW (ref 31.5–35.7)
MCV: 86 fL (ref 79–97)
Platelets: 307 10*3/uL (ref 150–450)
RBC: 3.31 x10E6/uL — ABNORMAL LOW (ref 3.77–5.28)
RDW: 13.7 % (ref 11.7–15.4)
WBC: 5.8 10*3/uL (ref 3.4–10.8)

## 2021-01-28 LAB — B12 AND FOLATE PANEL
Folate: 14.5 ng/mL (ref >3.0)
Vitamin B-12: 551 pg/mL (ref 232–1245)

## 2021-01-28 LAB — IRON,TIBC AND FERRITIN PANEL
Ferritin: 16 ng/mL (ref 15–150)
Iron Saturation: 3 % — CL (ref 15–55)
Iron: 13 ug/dL — ABNORMAL LOW (ref 27–139)
Total Iron Binding Capacity: 456 ug/dL — ABNORMAL HIGH (ref 250–450)
UIBC: 443 ug/dL — ABNORMAL HIGH (ref 118–369)

## 2021-01-28 MED ORDER — FUSION PLUS PO CAPS: 1.0000 | ORAL_CAPSULE | Freq: Every day | ORAL | 2 refills | Status: DC

## 2021-01-28 NOTE — Telephone Encounter (Signed)
Called and left a message for call back. Sent Fusion plus to the pharmacy

## 2021-01-28 NOTE — Telephone Encounter (Signed)
Patient verbalized understanding of results. Sent Fusion plus to the pharmacy

## 2021-01-28 NOTE — Telephone Encounter (Signed)
-----   Message from Lin Landsman, MD sent at 01/28/2021  8:43 AM EDT ----- Patient's Hb is stable since her last discharge from hospital. Her iron is very low, recommend fusion plus, 1 pill daily for 3 months  RV

## 2021-02-02 ENCOUNTER — Encounter: Payer: Self-pay | Admitting: Family Medicine

## 2021-02-04 ENCOUNTER — Telehealth: Payer: Self-pay | Admitting: Family Medicine

## 2021-02-04 NOTE — Telephone Encounter (Signed)
Noted  

## 2021-02-04 NOTE — Telephone Encounter (Signed)
LMTCB to schedule a HFU appt on or before 02/15/21

## 2021-02-04 NOTE — Telephone Encounter (Signed)
Mrs. Metztli called in and got her schedule for 10/18 @12 

## 2021-02-10 ENCOUNTER — Ambulatory Visit (INDEPENDENT_AMBULATORY_CARE_PROVIDER_SITE_OTHER): Payer: PPO | Admitting: Family Medicine

## 2021-02-10 ENCOUNTER — Other Ambulatory Visit: Payer: Self-pay

## 2021-02-10 ENCOUNTER — Encounter: Payer: Self-pay | Admitting: Family Medicine

## 2021-02-10 VITALS — BP 128/72 | HR 72 | Temp 97.7°F | Ht 61.0 in | Wt 156.1 lb

## 2021-02-10 DIAGNOSIS — K5730 Diverticulosis of large intestine without perforation or abscess without bleeding: Secondary | ICD-10-CM

## 2021-02-10 DIAGNOSIS — K922 Gastrointestinal hemorrhage, unspecified: Secondary | ICD-10-CM | POA: Diagnosis not present

## 2021-02-10 DIAGNOSIS — K5909 Other constipation: Secondary | ICD-10-CM

## 2021-02-10 DIAGNOSIS — G4700 Insomnia, unspecified: Secondary | ICD-10-CM

## 2021-02-10 DIAGNOSIS — S0285XD Fracture of orbit, unspecified, subsequent encounter for fracture with routine healing: Secondary | ICD-10-CM

## 2021-02-10 DIAGNOSIS — I38 Endocarditis, valve unspecified: Secondary | ICD-10-CM

## 2021-02-10 DIAGNOSIS — N1831 Chronic kidney disease, stage 3a: Secondary | ICD-10-CM

## 2021-02-10 DIAGNOSIS — D508 Other iron deficiency anemias: Secondary | ICD-10-CM

## 2021-02-10 DIAGNOSIS — I10 Essential (primary) hypertension: Secondary | ICD-10-CM | POA: Diagnosis not present

## 2021-02-10 DIAGNOSIS — R600 Localized edema: Secondary | ICD-10-CM

## 2021-02-10 DIAGNOSIS — R55 Syncope and collapse: Secondary | ICD-10-CM | POA: Diagnosis not present

## 2021-02-10 DIAGNOSIS — D509 Iron deficiency anemia, unspecified: Secondary | ICD-10-CM | POA: Insufficient documentation

## 2021-02-10 DIAGNOSIS — H3530 Unspecified macular degeneration: Secondary | ICD-10-CM

## 2021-02-10 DIAGNOSIS — R531 Weakness: Secondary | ICD-10-CM | POA: Diagnosis not present

## 2021-02-10 DIAGNOSIS — S0285XA Fracture of orbit, unspecified, initial encounter for closed fracture: Secondary | ICD-10-CM

## 2021-02-10 HISTORY — DX: Fracture of orbit, unspecified, initial encounter for closed fracture: S02.85XA

## 2021-02-10 LAB — CBC WITH DIFFERENTIAL/PLATELET
Basophils Absolute: 0 10*3/uL (ref 0.0–0.1)
Basophils Relative: 0.7 % (ref 0.0–3.0)
Eosinophils Absolute: 0.2 10*3/uL (ref 0.0–0.7)
Eosinophils Relative: 3.5 % (ref 0.0–5.0)
HCT: 32.1 % — ABNORMAL LOW (ref 36.0–46.0)
Hemoglobin: 10.3 g/dL — ABNORMAL LOW (ref 12.0–15.0)
Lymphocytes Relative: 23.7 % (ref 12.0–46.0)
Lymphs Abs: 1.2 10*3/uL (ref 0.7–4.0)
MCHC: 32.2 g/dL (ref 30.0–36.0)
MCV: 86.6 fl (ref 78.0–100.0)
Monocytes Absolute: 0.7 10*3/uL (ref 0.1–1.0)
Monocytes Relative: 13.6 % — ABNORMAL HIGH (ref 3.0–12.0)
Neutro Abs: 3 10*3/uL (ref 1.4–7.7)
Neutrophils Relative %: 58.5 % (ref 43.0–77.0)
Platelets: 250 10*3/uL (ref 150.0–400.0)
RBC: 3.71 Mil/uL — ABNORMAL LOW (ref 3.87–5.11)
RDW: 22.6 % — ABNORMAL HIGH (ref 11.5–15.5)
WBC: 5.1 10*3/uL (ref 4.0–10.5)

## 2021-02-10 LAB — COMPREHENSIVE METABOLIC PANEL
ALT: 11 U/L (ref 0–35)
AST: 17 U/L (ref 0–37)
Albumin: 4 g/dL (ref 3.5–5.2)
Alkaline Phosphatase: 85 U/L (ref 39–117)
BUN: 16 mg/dL (ref 6–23)
CO2: 28 mEq/L (ref 19–32)
Calcium: 9.2 mg/dL (ref 8.4–10.5)
Chloride: 97 mEq/L (ref 96–112)
Creatinine, Ser: 1.08 mg/dL (ref 0.40–1.20)
GFR: 45.95 mL/min — ABNORMAL LOW (ref >60.00)
Glucose, Bld: 103 mg/dL — ABNORMAL HIGH (ref 70–99)
Potassium: 4.1 mEq/L (ref 3.5–5.1)
Sodium: 133 mEq/L — ABNORMAL LOW (ref 135–145)
Total Bilirubin: 0.6 mg/dL (ref 0.2–1.2)
Total Protein: 6.2 g/dL (ref 6.0–8.3)

## 2021-02-10 LAB — BRAIN NATRIURETIC PEPTIDE: Pro B Natriuretic peptide (BNP): 165 pg/mL — ABNORMAL HIGH (ref 0.0–100.0)

## 2021-02-10 NOTE — Assessment & Plan Note (Signed)
Now on daily docusate and chewable fiber supplement.

## 2021-02-10 NOTE — Assessment & Plan Note (Signed)
Thought diverticular, complicated by ABLA with syncope and orbital fracture s/p hospitalization

## 2021-02-10 NOTE — Progress Notes (Signed)
Patient ID: Brooke Beasley, female    DOB: 15-Feb-1931, 87 y.o.   MRN: [id number]  This visit was conducted in person.  BP 128/72   Pulse 72   Temp 97.7 F (36.5 C) (Temporal)   Ht 5\' 1"  (1.549 m)   Wt 156 lb 2 oz (70.8 kg)   SpO2 96%   BMI 29.50 kg/m   Orthostatic VS for the past 24 hrs (Last 3 readings):  BP- Lying BP- Standing at 3 minutes  02/10/21 1254 -- 156/82  02/10/21 1251 146/82 --     CC: hosp f/u visit  Subjective:   HPI: Brooke Beasley is a 87 y.o. female presenting on 02/10/2021 for Hospitalization Follow-up (Admitted on 01/14/21 to [facility], dx GI bleed; orbital fx.  Pt accompanied by daughter, [name]- temp 97.8.)   Recent hospitalization for syncope after large bloody stool after GoLytely prep, with resultant R orbital and maxillary antral wall fracture. Acute blood loss anemia with Hgb down to 8, treated with 1u pRBC transfusion. Also incidentally found anterior subluxation of C4 on C5. Treated preventatively with augmentin course for fracture, planned follow up with Dr Mancel Parsons dentist. S/p flex sig - no cause of rectal bleed found, presumed diverticular bleed.  Hospital records reviewed. Med rec performed. Lasix, methocarbamol, omeprazole, preservision and trulance stopped.   GI started fusion plus 1 pill daily for 3 months.  Since home, ongoing fatigue, weakness, nausea. Wonders if fusion plus is causing this.  Not sleeping well - falls asleep at 2-3am, frequently wakes up to urinate.  ESS = 3 Also endorses ongoing mild dyspnea, occasional cough. No leg swelling.   Home health recommended, never set up.  Other follow up appointments scheduled: needs to schedule GI ([place]) appt They have not seen outpatient dentist yet.  ____________________________________________________________________ Hospital admission: 01/14/2021 Hospital discharge: 01/16/2021 TCM f/u phone call: not performed   Discharge Diagnoses:  Principal diagnosis Lower GI bleed  likely diverticular Active Problems:   Lower GI bleed   Consultants: Gastroenterology  Procedures: Flexible sigmoidoscopy  Admitted From: Home Disposition:  Home    Recommendations for Outpatient Follow-up:  PCP: Follow-up with PCP in 1 week. Follow up LABS/TEST: CBC     Relevant past medical, surgical, family and social history reviewed and updated as indicated. Interim medical history since our last visit reviewed. Allergies and medications reviewed and updated. Outpatient Medications Prior to Visit  Medication Sig Dispense Refill   acetaminophen (TYLENOL) 500 MG tablet Take 500-1,000 mg by mouth every 6 (six) hours as needed for moderate pain. Depends on pain     albuterol (PROVENTIL HFA;VENTOLIN HFA) 108 (90 Base) MCG/ACT inhaler Inhale 2 puffs into the lungs every 6 (six) hours as needed for wheezing or shortness of breath. 1 Inhaler 0   bimatoprost (LUMIGAN) 0.01 % SOLN Place 1 drop into both eyes at bedtime.     Bioflavonoid Products (VITAMIN C) CHEW Chew 1 tablet by mouth daily.     bisacodyl (DULCOLAX) 5 MG EC tablet Take 1 tablet (5 mg total) by mouth daily as needed for moderate constipation. 30 tablet 1   cholecalciferol (VITAMIN D) 1000 units tablet Take 1,000 Units by mouth daily.     clonazePAM (KLONOPIN) 0.5 MG tablet TAKE 1 TABLET BY MOUTH AT BEDTIME 30 tablet 0   colchicine 0.6 MG tablet Take 1 tablet (0.6 mg total) by mouth daily as needed (gout flare). (Patient taking differently: Take 0.6 mg by mouth at bedtime.) 90 tablet 0   denosumab (  PROLIA) 60 MG/ML SOSY injection Inject 60 mg into the skin every 6 (six) months. 1 mL 1   docusate sodium (COLACE) 100 MG capsule Take 1 capsule (100 mg total) by mouth 2 (two) times daily. 60 capsule 0   EUTHYROX 100 MCG tablet Take 1 tablet by mouth once daily 90 tablet 2   Famotidine-Ca Carb-Mag Hydrox (PEPCID COMPLETE PO) Take 1 tablet by mouth daily as needed (indigestion).     hydrocortisone 2.5 % cream Place 1 application  rectally daily as needed (hemorrhoids).     Inulin (FIBER CHOICE PO) Take 1 tablet by mouth daily.     irbesartan (AVAPRO) 150 MG tablet Take 1 tablet (150 mg total) by mouth daily. 30 tablet 3   Iron-FA-B Cmp-C-Biot-Probiotic (FUSION PLUS) CAPS Take 1 capsule by mouth daily. 30 capsule 2   Misc Natural Products (OSTEO BI-FLEX TRIPLE STRENGTH PO) Take 1 tablet by mouth daily. With Vit D     ondansetron (ZOFRAN) 4 MG tablet Take 1 tablet (4 mg total) by mouth every 8 (eight) hours as needed for nausea or vomiting. 20 tablet 0   polyethylene glycol (MIRALAX / GLYCOLAX) 17 g packet Take 17 g by mouth daily.     promethazine-phenylephrine (PROMETHAZINE VC) 6.25-5 MG/5ML SYRP Take 5 mLs by mouth every 4 (four) hours as needed for congestion. 118 mL 0   sertraline (ZOLOFT) 100 MG tablet Take 100 mg by mouth in the morning.     Simethicone 180 MG CAPS Take 1 capsule by mouth daily as needed (indigestion).     spironolactone (ALDACTONE) 25 MG tablet Take 1 tablet (25 mg total) by mouth daily. 30 tablet 6   torsemide (DEMADEX) 20 MG tablet Take 20 mg by mouth daily as needed (fluid).     TURMERIC PO Take 1 tablet by mouth daily.     VITAMIN E PO Take 1 tablet by mouth daily.     VOLTAREN 1 % GEL APPLY ONE APPLICATION TOPICALLY 3 TIMES DAILY (Patient taking differently: Apply 2 g topically 4 (four) times daily as needed (pain).) 100 g 3   Wheat Dextrin (BENEFIBER DRINK MIX PO) Take 15 mLs by mouth daily.     No facility-administered medications prior to visit.     Per HPI unless specifically indicated in ROS section below Review of Systems  Objective:  BP 128/72   Pulse 72   Temp 97.7 F (36.5 C) (Temporal)   Ht 5\' 1"  (1.549 m)   Wt 156 lb 2 oz (70.8 kg)   SpO2 96%   BMI 29.50 kg/m   Wt Readings from Last 3 Encounters:  02/10/21 156 lb 2 oz (70.8 kg)  01/15/21 159 lb 2.8 oz (72.2 kg)  12/19/20 157 lb (71.2 kg)      Physical Exam Vitals and nursing note reviewed.  Constitutional:       Appearance: Normal appearance. She is not ill-appearing.  Cardiovascular:     Rate and Rhythm: Normal rate and regular rhythm.     Pulses: Normal pulses.     Heart sounds: Murmur (4/6 systolic best at RUSB) heard.  Pulmonary:     Effort: Pulmonary effort is normal. No respiratory distress.     Breath sounds: Normal breath sounds. No wheezing, rhonchi or rales.  Abdominal:     General: Bowel sounds are normal. There is no distension.     Palpations: Abdomen is soft. There is no mass.     Tenderness: There is no abdominal tenderness. There is no  guarding or rebound.     Hernia: No hernia is present.  Musculoskeletal:     Right lower leg: No edema.     Left lower leg: No edema.  Skin:    General: Skin is warm and dry.     Findings: No rash.  Neurological:     Mental Status: She is alert.  Psychiatric:        Mood and Affect: Mood normal.        Behavior: Behavior normal.      Results for orders placed or performed in visit on 01/16/21  CBC  Result Value Ref Range   WBC 5.8 3.4 - 10.8 x10E3/uL   RBC 3.31 (L) 3.77 - 5.28 x10E6/uL   Hemoglobin 8.7 (L) 11.1 - 15.9 g/dL   Hematocrit 28.5 (L) 34.0 - 46.6 %   MCV 86 79 - 97 fL   MCH 26.3 (L) 26.6 - 33.0 pg   MCHC 30.5 (L) 31.5 - 35.7 g/dL   RDW 13.7 11.7 - 15.4 %   Platelets 307 150 - 450 x10E3/uL  Fe+TIBC+Fer  Result Value Ref Range   Total Iron Binding Capacity 456 (H) 250 - 450 ug/dL   UIBC 443 (H) 118 - 369 ug/dL   Iron 13 (L) 27 - 139 ug/dL   Iron Saturation 3 (LL) 15 - 55 %   Ferritin 16 15 - 150 ng/mL  B12 and Folate Panel  Result Value Ref Range   Vitamin B-12 551 232 - 1,245 pg/mL   Folate 14.5 >3.0 ng/mL    Assessment & Plan:  This visit occurred during the SARS-CoV-2 public health emergency.  Safety protocols were in place, including screening questions prior to the visit, additional usage of staff PPE, and extensive cleaning of exam room while observing appropriate contact time as indicated for disinfecting  solutions.   Problem List Items Addressed This Visit     Hypertension, essential    BP adequate on current regimen - continue irbesartan.  Will need to verify she continues PRN torsemide.      Chronic constipation with overflow incontinence    Now on daily docusate and chewable fiber supplement.       Syncope    Recently due to ABLA after presumed diverticular bleed.       Valvular heart disease    Echo from 11/2019 showed mild-mod MR. Due for rpt imaging. Harsher sounding murmur noted today, along with some symptoms which could be attributed to worsening MR (although likely multifactorial in nature). Will check BNP and update echocardiogram. Consider referral to cards pending results.       Relevant Orders   Brain natriuretic peptide   ECHOCARDIOGRAM COMPLETE   Insomnia    Ongoing difficulty - see above       CKD (chronic kidney disease) stage 3, GFR 30-59 ml/min (HCC)   Diverticulosis of colon without hemorrhage   Weakness   Pedal edema    Not appreciated today.       Macular degeneration    AREDS2 preservision MVI will likely be restarted after deciding on plan for fusion plus iron/multivitamin      Iron deficiency anemia - Primary   Relevant Orders   CBC with Differential/Platelet   Comprehensive metabolic panel   Closed fracture of right orbit ([place])    R orbital fracture as well as fracture of maxillary antral wall. Has not set up hospital f/u visit with oral surgeon - # provided to call Dr Sander Radon office for appt.  No orders of the defined types were placed in this encounter.  Orders Placed This Encounter  Procedures   CBC with Differential/Platelet   Comprehensive metabolic panel   Brain natriuretic peptide   ECHOCARDIOGRAM COMPLETE    Standing Status:   Future    Standing Expiration Date:   02/10/2022    Order Specific Question:   Where should this test be performed    Answer:   [facility]    Order Specific Question:   Perflutren DEFINITY  (image enhancing agent) should be administered unless hypersensitivity or allergy exist    Answer:   Administer Perflutren    Order Specific Question:   Is a special reader required? (athlete or structural heart)    Answer:   No    Order Specific Question:   Does this study need to be read by the Structural team/Level 3 readers?    Answer:   No    Order Specific Question:   Reason for exam-Echo    Answer:   Mitral Valve Disorder  I05.9     Patient Instructions  Labs today  We will be in touch with results.  Call dentist Dr Sander Radon office to schedule hospital follow up [phone]). Check with GI about fusion plus vitamin and if it could cause nausea. Restart preservision after you find out plans for iron.  Keep follow up visit for physical in December. We will order echocardiogram as well.   Follow up plan: Return if symptoms worsen or fail to improve.  [name], MD

## 2021-02-10 NOTE — Assessment & Plan Note (Addendum)
Recently due to [facility] after presumed diverticular bleed.

## 2021-02-10 NOTE — Assessment & Plan Note (Signed)
AREDS2 preservision MVI will likely be restarted after deciding on plan for fusion plus iron/multivitamin

## 2021-02-10 NOTE — Telephone Encounter (Signed)
Seen in office today  

## 2021-02-10 NOTE — Patient Instructions (Addendum)
Labs today  We will be in touch with results.  Call dentist Dr Sander Radon office to schedule hospital follow up [phone]). Check with GI about fusion plus vitamin and if it could cause nausea. Restart preservision after you find out plans for iron.  Keep follow up visit for physical in December. We will order echocardiogram as well.

## 2021-02-10 NOTE — Assessment & Plan Note (Addendum)
Echo from 11/2019 showed mild-mod MR. Due for rpt imaging. Harsher sounding murmur noted today, along with some symptoms which could be attributed to worsening MR (although likely multifactorial in nature). Will check BNP and update echocardiogram. Consider referral to cards pending results.

## 2021-02-10 NOTE — Assessment & Plan Note (Signed)
Not appreciated today.  

## 2021-02-10 NOTE — Assessment & Plan Note (Signed)
BP adequate on current regimen - continue irbesartan.  Will need to verify she continues PRN torsemide.

## 2021-02-10 NOTE — Assessment & Plan Note (Signed)
Ongoing difficulty - see above

## 2021-02-10 NOTE — Assessment & Plan Note (Signed)
R orbital fracture as well as fracture of maxillary antral wall. Has not set up hospital f/u visit with oral surgeon - # provided to call Dr Sander Radon office for appt.

## 2021-02-19 DIAGNOSIS — M19011 Primary osteoarthritis, right shoulder: Secondary | ICD-10-CM | POA: Diagnosis not present

## 2021-02-19 DIAGNOSIS — M11211 Other chondrocalcinosis, right shoulder: Secondary | ICD-10-CM | POA: Diagnosis not present

## 2021-02-19 DIAGNOSIS — M1812 Unilateral primary osteoarthritis of first carpometacarpal joint, left hand: Secondary | ICD-10-CM | POA: Diagnosis not present

## 2021-02-21 ENCOUNTER — Other Ambulatory Visit: Payer: Self-pay | Admitting: Family Medicine

## 2021-02-23 NOTE — Telephone Encounter (Signed)
ERx 

## 2021-03-06 DIAGNOSIS — H524 Presbyopia: Secondary | ICD-10-CM | POA: Diagnosis not present

## 2021-03-06 DIAGNOSIS — H52223 Regular astigmatism, bilateral: Secondary | ICD-10-CM | POA: Diagnosis not present

## 2021-03-06 DIAGNOSIS — H04123 Dry eye syndrome of bilateral lacrimal glands: Secondary | ICD-10-CM | POA: Diagnosis not present

## 2021-03-06 DIAGNOSIS — D3132 Benign neoplasm of left choroid: Secondary | ICD-10-CM | POA: Diagnosis not present

## 2021-03-06 DIAGNOSIS — H401132 Primary open-angle glaucoma, bilateral, moderate stage: Secondary | ICD-10-CM | POA: Diagnosis not present

## 2021-03-06 DIAGNOSIS — H353131 Nonexudative age-related macular degeneration, bilateral, early dry stage: Secondary | ICD-10-CM | POA: Diagnosis not present

## 2021-03-18 ENCOUNTER — Ambulatory Visit (INDEPENDENT_AMBULATORY_CARE_PROVIDER_SITE_OTHER): Payer: PPO

## 2021-03-18 ENCOUNTER — Other Ambulatory Visit: Payer: Self-pay

## 2021-03-18 ENCOUNTER — Encounter: Payer: Self-pay | Admitting: Family Medicine

## 2021-03-18 DIAGNOSIS — I38 Endocarditis, valve unspecified: Secondary | ICD-10-CM | POA: Diagnosis not present

## 2021-03-18 DIAGNOSIS — I3489 Other nonrheumatic mitral valve disorders: Secondary | ICD-10-CM | POA: Diagnosis not present

## 2021-03-18 LAB — ECHOCARDIOGRAM COMPLETE
AR max vel: 1.4 cm2
AV Area VTI: 1.48 cm2
AV Area mean vel: 1.34 cm2
AV Mean grad: 14.5 mmHg
AV Peak grad: 27.2 mmHg
Ao pk vel: 2.61 m/s
Area-P 1/2: 3.51 cm2
Calc EF: 50.4 %
S' Lateral: 3 cm
Single Plane A2C EF: 53.6 %
Single Plane A4C EF: 50.4 %

## 2021-03-21 ENCOUNTER — Other Ambulatory Visit: Payer: Self-pay | Admitting: Family Medicine

## 2021-03-21 NOTE — Telephone Encounter (Signed)
ERx 

## 2021-03-22 ENCOUNTER — Other Ambulatory Visit: Payer: Self-pay | Admitting: Family Medicine

## 2021-03-22 DIAGNOSIS — D508 Other iron deficiency anemias: Secondary | ICD-10-CM

## 2021-03-22 DIAGNOSIS — E039 Hypothyroidism, unspecified: Secondary | ICD-10-CM

## 2021-03-22 DIAGNOSIS — R7303 Prediabetes: Secondary | ICD-10-CM

## 2021-03-22 DIAGNOSIS — N1831 Chronic kidney disease, stage 3a: Secondary | ICD-10-CM

## 2021-03-22 DIAGNOSIS — L299 Pruritus, unspecified: Secondary | ICD-10-CM

## 2021-03-22 DIAGNOSIS — E785 Hyperlipidemia, unspecified: Secondary | ICD-10-CM

## 2021-03-22 DIAGNOSIS — M810 Age-related osteoporosis without current pathological fracture: Secondary | ICD-10-CM

## 2021-03-24 ENCOUNTER — Other Ambulatory Visit: Payer: PPO

## 2021-03-26 NOTE — Addendum Note (Signed)
Addended by: [name] on: 03/26/2021 11:19 AM   Modules accepted: Orders

## 2021-03-27 ENCOUNTER — Other Ambulatory Visit: Payer: Self-pay

## 2021-03-27 ENCOUNTER — Encounter: Payer: Self-pay | Admitting: Cardiology

## 2021-03-27 ENCOUNTER — Ambulatory Visit: Payer: PPO | Admitting: Cardiology

## 2021-03-27 ENCOUNTER — Other Ambulatory Visit: Payer: PPO

## 2021-03-27 ENCOUNTER — Other Ambulatory Visit (INDEPENDENT_AMBULATORY_CARE_PROVIDER_SITE_OTHER): Payer: PPO

## 2021-03-27 VITALS — BP 110/60 | HR 63 | Ht 61.0 in | Wt 154.0 lb

## 2021-03-27 DIAGNOSIS — L299 Pruritus, unspecified: Secondary | ICD-10-CM | POA: Diagnosis not present

## 2021-03-27 DIAGNOSIS — D508 Other iron deficiency anemias: Secondary | ICD-10-CM

## 2021-03-27 DIAGNOSIS — I10 Essential (primary) hypertension: Secondary | ICD-10-CM | POA: Diagnosis not present

## 2021-03-27 DIAGNOSIS — E039 Hypothyroidism, unspecified: Secondary | ICD-10-CM

## 2021-03-27 DIAGNOSIS — E785 Hyperlipidemia, unspecified: Secondary | ICD-10-CM

## 2021-03-27 DIAGNOSIS — N1831 Chronic kidney disease, stage 3a: Secondary | ICD-10-CM

## 2021-03-27 DIAGNOSIS — I350 Nonrheumatic aortic (valve) stenosis: Secondary | ICD-10-CM | POA: Diagnosis not present

## 2021-03-27 DIAGNOSIS — I5189 Other ill-defined heart diseases: Secondary | ICD-10-CM | POA: Diagnosis not present

## 2021-03-27 NOTE — Patient Instructions (Addendum)
Medication Instructions:  Your physician recommends that you continue on your current medications as directed. Please refer to the Current Medication list given to you today.  *If you need a refill on your cardiac medications before your next appointment, please call your pharmacy*   Lab Work: None ordered If you have labs (blood work) drawn today and your tests are completely normal, you will receive your results only by: [place] (if you have MyChart) OR A paper copy in the mail If you have any lab test that is abnormal or we need to change your treatment, we will call you to review the results.   Testing/Procedures: None ordered   Follow-Up: At [facility], you and your health needs are our priority.  As part of our continuing mission to provide you with exceptional heart care, we have created designated Provider Care Teams.  These Care Teams include your primary Cardiologist (physician) and Advanced Practice Providers (APPs -  Physician Assistants and Nurse Practitioners) who all work together to provide you with the care you need, when you need it.  We recommend signing up for the patient portal called "MyChart".  Sign up information is provided on this After Visit Summary.  MyChart is used to connect with patients for Virtual Visits (Telemedicine).  Patients are able to view lab/test results, encounter notes, upcoming appointments, etc.  Non-urgent messages can be sent to your provider as well.   To learn more about what you can do with MyChart, go to [email or web address].    Your next appointment:   6-12 month(s)  The format for your next appointment:   In Person  Provider:   You may see Dr. Garen Lah or one of the following Advanced Practice Providers on your designated Care Team:   Murray Hodgkins, NP Christell Faith, PA-C Cadence Kathlen Mody, [place]    Other Instructions

## 2021-03-27 NOTE — Progress Notes (Signed)
Labs were ordered by Dr. Danise Mina, patient was in our office today and requested having them drawn while she was here. Changed her lab orders under Dr. Danise Mina to be drawn as requested.

## 2021-03-27 NOTE — Progress Notes (Signed)
Cardiology Office Note:    Date:  03/27/2021   ID:  Brooke Beasley, DOB 1930/08/08, MRN [id number]  PCP:  [name], MD  [facility] Cardiologist:  None  [facility] Electrophysiologist:  None   Referring MD: [name], MD   Chief Complaint  Patient presents with   Other    3 month follow up -- Meds reviewed verbally with patient.     History of Present Illness:    Brooke Beasley is a 87 y.o. female with a hx of anxiety, grade 2 diastolic dysfunction, hypertension, hyperlipidemia, mild aortic stenosis who presents for follow-up.    Being seen due to aortic stenosis and elevated BP.  Medications have been adjusted, BP now normal.  Currently takes irbesartan and Aldactone.  Did not tolerate lisinopril, amlodipine or Coreg in the past.  Admitted 2 months ago 12/2020 due to lower GI bleed from hemorrhoids and diverticulosis.  Sigmoidoscopy did not reveal any acute bleeding.  Outpatient GI follow-up was recommended.   Prior notes Echocardiogram on 11/26/2019 showed normal systolic function, EF 60 to 76%, grade 2 diastolic dysfunction.  Mild to moderate MR, mild AS.  Patient previously took lisinopril, but stopped due to side effects/cough.  Amlodipine stopped due to abdominal pains, Coreg was stopped because of fatigue.  Past Medical History:  Diagnosis Date   Anxiety    C. difficile diarrhea 07/11/2018   S/p multiple oral vanc treatments (course and tapers) and completed Zinplava monoclonal Ab treatment (05/10/2019) Fecal transplant on hold during COVID pandemic   CAP (community acquired pneumonia) 12/22/2017   Carotid stenosis 10/18/2014   R 1-39%, L 40-59%, rpt 1 yr (09/2014)    CKD (chronic kidney disease) stage 3, GFR 30-59 ml/min (HCC) 08/02/2014   Clostridioides difficile infection    hx 2020   Degenerative disc disease    LS   Depression    nervous breakdonw in 1964-out of work for a year   Diverticulosis    severe by colonoscopy    Family history of adverse reaction to anesthesia    n/v   GERD (gastroesophageal reflux disease)    Glaucoma    Heart murmur 08/2011   mitral regurge - on echo    History of hiatal hernia    History of shingles    HTN (hypertension)    Hyperlipidemia    Hypertension    Hypothyroidism    IBS 11/02/2006   Intestinal bacterial overgrowth    In small colon   Lower GI bleed 12/2020   thought diverticular complicated by ABLA with syncope and orbital fracture s/p hospitalization   Maxillary fracture ([place]) 04/08/2012   Osteoarthritis 2016   ([facility])   Osteoporosis    dexa 2011   Pseudogout 2016   shoulders ([place])   Pseudogout     Past Surgical History:  Procedure Laterality Date   barium enema  2012   severe diverticulosis, redundant colon   Bowel obstruction  1999   no surgery in hosp x 3 days   COLONOSCOPY  1. 1999  2. 11/04   1. Not finished  2. Slight hemorrhage rectosigmoid area, severe sig diverticulosis   COLONOSCOPY WITH PROPOFOL N/A 09/10/2019   SSP with dysplasia, TA, rpt 3 yrs Marius Ditch, Tally Due, MD)   Dexa  1. [phone]   2. 9/04  3. 3/08    1. OP  2. OP, borderline, spine -2.44T  3. decreased BMD-OP   DEXA  2011   OP in spine   DG KNEE  1-2 VIEWS BILAT     LS x-ray with degenerative disc and facet change   ESOPHAGOGASTRODUODENOSCOPY     Negative   FLEXIBLE SIGMOIDOSCOPY N/A 01/16/2021   Procedure: FLEXIBLE SIGMOIDOSCOPY;  Surgeon: Gatha Mayer, MD;  Location: [facility] ENDOSCOPY;  Service: Endoscopy;  Laterality: N/A;  or unsedated   Hemorrhoid procedure  07/2006   JOINT REPLACEMENT     TONSILLECTOMY     TOTAL SHOULDER ARTHROPLASTY Left 11/27/2015   Corky Mull, MD   TOTAL SHOULDER REVISION Left 03/16/2016   Procedure: TOTAL SHOULDER REVISION;  Surgeon: Corky Mull, MD;  Location: [facility] ORS;  Service: Orthopedics;  Laterality: Left;   US ECHOCARDIOGRAPHY  [id number]   Normal systolic fxn with EF 32-67%.  Focal basal septal hypertrophy.  Mild diastolic  dysfunction.  Mild MR.    Current Medications: Current Meds  Medication Sig   acetaminophen (TYLENOL) 500 MG tablet Take 500-1,000 mg by mouth every 6 (six) hours as needed for moderate pain. Depends on pain   albuterol (PROVENTIL HFA;VENTOLIN HFA) 108 (90 Base) MCG/ACT inhaler Inhale 2 puffs into the lungs every 6 (six) hours as needed for wheezing or shortness of breath.   bimatoprost (LUMIGAN) 0.01 % SOLN Place 1 drop into both eyes at bedtime.   Bioflavonoid Products (VITAMIN C) CHEW Chew 1 tablet by mouth daily.   bisacodyl (DULCOLAX) 5 MG EC tablet Take 1 tablet (5 mg total) by mouth daily as needed for moderate constipation.   cholecalciferol (VITAMIN D) 1000 units tablet Take 1,000 Units by mouth daily.   clonazePAM (KLONOPIN) 0.5 MG tablet TAKE 1 TABLET BY MOUTH AT BEDTIME   colchicine 0.6 MG tablet Take 1 tablet (0.6 mg total) by mouth daily as needed (gout flare).   denosumab (PROLIA) 60 MG/ML SOSY injection Inject 60 mg into the skin every 6 (six) months.   docusate sodium (COLACE) 100 MG capsule Take 1 capsule (100 mg total) by mouth 2 (two) times daily.   EUTHYROX 100 MCG tablet Take 1 tablet by mouth once daily   Famotidine-Ca Carb-Mag Hydrox (PEPCID COMPLETE PO) Take 1 tablet by mouth daily as needed (indigestion).   furosemide (LASIX) 20 MG tablet Take 20 mg by mouth as needed.   hydrocortisone 2.5 % cream Place 1 application rectally daily as needed (hemorrhoids).   Inulin (FIBER CHOICE PO) Take 1 tablet by mouth daily.   irbesartan (AVAPRO) 150 MG tablet Take 1 tablet (150 mg total) by mouth daily.   Iron-FA-B Cmp-C-Biot-Probiotic (FUSION PLUS) CAPS Take 1 capsule by mouth daily.   Misc Natural Products (OSTEO BI-FLEX TRIPLE STRENGTH PO) Take 1 tablet by mouth daily. With Vit D   ondansetron (ZOFRAN) 4 MG tablet Take 1 tablet (4 mg total) by mouth every 8 (eight) hours as needed for nausea or vomiting.   polyethylene glycol (MIRALAX / GLYCOLAX) 17 g packet Take 17 g by  mouth daily.   promethazine-phenylephrine (PROMETHAZINE VC) 6.25-5 MG/5ML SYRP Take 5 mLs by mouth every 4 (four) hours as needed for congestion.   sertraline (ZOLOFT) 100 MG tablet Take 100 mg by mouth in the morning.   Simethicone 180 MG CAPS Take 1 capsule by mouth daily as needed (indigestion).   spironolactone (ALDACTONE) 25 MG tablet Take 1 tablet (25 mg total) by mouth daily.   TURMERIC PO Take 1 tablet by mouth daily.   VITAMIN E PO Take 1 tablet by mouth daily.   VOLTAREN 1 % GEL APPLY ONE APPLICATION TOPICALLY 3 TIMES DAILY  Wheat Dextrin (BENEFIBER DRINK MIX PO) Take 15 mLs by mouth daily.   [DISCONTINUED] torsemide (DEMADEX) 20 MG tablet Take 20 mg by mouth daily as needed (fluid).     Allergies:   Amlodipine, Prednisone, Ace inhibitors, Alendronate sodium, Carvedilol, Losartan, Other, Paroxetine hcl, Risedronate sodium, Silenor [doxepin hcl], Simvastatin, Sulfonamide derivatives, Toprol xl [metoprolol], Tramadol, and Influenza vaccines   Social History   Socioeconomic History   Marital status: Married    Spouse name: Not on file   Number of children: 2   Years of education: Not on file   Highest education level: Not on file  Occupational History   Occupation: Retired    Fish farm manager: RETIRED  Tobacco Use   Smoking status: Never   Smokeless tobacco: Never  Vaping Use   Vaping Use: Never used  Substance and Sexual Activity   Alcohol use: No   Drug use: No   Sexual activity: Not Currently  Other Topics Concern   Not on file  Social History Narrative   Left handed   Widow. Husband ([name]) deceased Mar 13, 2016 from dementia. She was caregiver.    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  ROS:   Please see the history of present illness.     All other systems reviewed and are negative.  EKGs/Labs/Other Studies Reviewed:    The following studies were reviewed today:   EKG:  EKG not ordered today.    Recent Labs: 01/15/2021: Magnesium 1.9 02/10/2021: ALT 11; BUN 16; Creatinine, Ser 1.08; Hemoglobin 10.3; Platelets 250.0; Potassium 4.1; Pro B Natriuretic peptide (BNP) 165.0; Sodium 133  Recent Lipid Panel    Component Value Date/Time   CHOL 186 03/17/2020 1519   TRIG 137 03/17/2020 1519   HDL 52 03/17/2020 1519   CHOLHDL 3.6 03/17/2020 1519   VLDL 27 03/17/2020 1519   LDLCALC 107 (H) 03/17/2020 1519   LDLDIRECT 86.0 09/19/2014 1049    Physical Exam:    VS:  BP 110/60 (BP Location: Right Arm, Patient Position: Sitting, Cuff Size: Normal)   Pulse 63   Ht 5\' 1"  (1.549 m)   Wt 154 lb (69.9 kg)   SpO2 97%   BMI 29.10 kg/m     Wt Readings from Last 3 Encounters:  03/27/21 154 lb (69.9 kg)  02/10/21 156 lb 2 oz (70.8 kg)  01/15/21 159 lb 2.8 oz (72.2 kg)     GEN:  Well nourished, well developed in no acute distress HEENT: Normal NECK: No JVD; No carotid bruits LYMPHATICS: No lymphadenopathy CARDIAC: RRR, 3/6 systolic murmur, rubs, gallops RESPIRATORY:  Clear to auscultation without rales, wheezing or rhonchi  ABDOMEN: Soft, non-tender, non-distended MUSCULOSKELETAL: Trace edema; No deformity  SKIN: Warm and dry NEUROLOGIC:  Alert and oriented x 3 PSYCHIATRIC:  Normal affect   ASSESSMENT:    1. Primary hypertension   2. Diastolic dysfunction   3. Aortic valve stenosis, etiology of cardiac valve disease unspecified  PLAN:    In order of problems listed above:  Hypertension, BP  controlled.  Continue irbesartan 150 mg daily, Aldactone 25 mg daily. Grade 2 diastolic dysfunction.  Mild MR. Continue Aldactone, continue torsemide as needed. Mild aortic stenosis, unchanged from echocardiogram 1 year ago.  Continue to monitor serially  Follow-up in 12 months.   Medication Adjustments/Labs and Tests Ordered: Current medicines are reviewed at length with the patient today.  Concerns regarding medicines are outlined above.  No orders of the defined types were placed in this encounter.  No orders of the defined types were placed in this encounter.    Patient Instructions  Medication Instructions:  Your physician recommends that you continue on your current medications as directed. Please refer to the Current Medication list given to you today.  *If you need a refill on your cardiac medications before your next appointment, please call your pharmacy*   Lab Work: None ordered If you have labs (blood work) drawn today and your tests are completely normal, you will receive your results only by: [place] (if you have MyChart) OR A paper copy in the mail If you have any lab test that is abnormal or we need to change your treatment, we will call you to review the results.   Testing/Procedures: None ordered   Follow-Up: At [facility], you and your health needs are our priority.  As part of our continuing mission to provide you with exceptional heart care, we have created designated Provider Care Teams.  These Care Teams include your primary Cardiologist (physician) and Advanced Practice Providers (APPs -  Physician Assistants and Nurse Practitioners) who all work together to provide you with the care you need, when you need it.  We recommend signing up for the patient portal called "MyChart".  Sign up information is provided on this After Visit Summary.  MyChart is used to connect with patients for Virtual Visits (Telemedicine).  Patients are able to view  lab/test results, encounter notes, upcoming appointments, etc.  Non-urgent messages can be sent to your provider as well.   To learn more about what you can do with MyChart, go to [email or web address].    Your next appointment:   6-12 month(s)  The format for your next appointment:   In Person  Provider:   You may see Dr. Garen Lah or one of the following Advanced Practice Providers on your designated Care Team:   Murray Hodgkins, NP Christell Faith, PA-C Cadence Kathlen Mody, [place]    Other Instructions    Signed, Kate Sable, MD  03/27/2021 12:46 PM    [place]

## 2021-03-28 LAB — CBC WITH DIFFERENTIAL/PLATELET
Basophils Absolute: 0.1 10*3/uL (ref 0.0–0.2)
Basos: 1 %
EOS (ABSOLUTE): 0.5 10*3/uL — ABNORMAL HIGH (ref 0.0–0.4)
Eos: 8 %
Hematocrit: 44.3 % (ref 34.0–46.6)
Hemoglobin: 14 g/dL (ref 11.1–15.9)
Immature Grans (Abs): 0 10*3/uL (ref 0.0–0.1)
Immature Granulocytes: 0 %
Lymphocytes Absolute: 1.8 10*3/uL (ref 0.7–3.1)
Lymphs: 28 %
MCH: 28.7 pg (ref 26.6–33.0)
MCHC: 31.6 g/dL (ref 31.5–35.7)
MCV: 91 fL (ref 79–97)
Monocytes Absolute: 0.8 10*3/uL (ref 0.1–0.9)
Monocytes: 13 %
Neutrophils Absolute: 3.4 10*3/uL (ref 1.4–7.0)
Neutrophils: 50 %
Platelets: 274 10*3/uL (ref 150–450)
RBC: 4.88 x10E6/uL (ref 3.77–5.28)
RDW: 16.8 % — ABNORMAL HIGH (ref 11.7–15.4)
WBC: 6.7 10*3/uL (ref 3.4–10.8)

## 2021-03-28 LAB — VITAMIN D 25 HYDROXY (VIT D DEFICIENCY, FRACTURES): Vit D, 25-Hydroxy: 52.1 ng/mL (ref 30.0–100.0)

## 2021-03-28 LAB — COMPREHENSIVE METABOLIC PANEL
ALT: 18 IU/L (ref 0–32)
AST: 24 IU/L (ref 0–40)
Albumin/Globulin Ratio: 1.7 (ref 1.2–2.2)
Albumin: 4.3 g/dL (ref 3.6–4.6)
Alkaline Phosphatase: 109 IU/L (ref 44–121)
BUN/Creatinine Ratio: 21 (ref 12–28)
BUN: 22 mg/dL (ref 8–27)
Bilirubin Total: 0.3 mg/dL (ref 0.0–1.2)
CO2: 25 mmol/L (ref 20–29)
Calcium: 9.8 mg/dL (ref 8.7–10.3)
Chloride: 97 mmol/L (ref 96–106)
Creatinine, Ser: 1.06 mg/dL — ABNORMAL HIGH (ref 0.57–1.00)
Globulin, Total: 2.5 g/dL (ref 1.5–4.5)
Glucose: 108 mg/dL — ABNORMAL HIGH (ref 70–99)
Potassium: 4.5 mmol/L (ref 3.5–5.2)
Sodium: 136 mmol/L (ref 134–144)
Total Protein: 6.8 g/dL (ref 6.0–8.5)
eGFR: 51 mL/min/{1.73_m2} — ABNORMAL LOW (ref >59)

## 2021-03-28 LAB — SEDIMENTATION RATE: Sed Rate: 11 mm/hr (ref 0–40)

## 2021-03-28 LAB — LIPID PANEL
Chol/HDL Ratio: 2.5 ratio (ref 0.0–4.4)
Cholesterol, Total: 179 mg/dL (ref 100–199)
HDL: 71 mg/dL (ref >39)
LDL Chol Calc (NIH): 95 mg/dL (ref 0–99)
Triglycerides: 69 mg/dL (ref 0–149)
VLDL Cholesterol Cal: 13 mg/dL (ref 5–40)

## 2021-03-28 LAB — PARATHYROID HORMONE, INTACT (NO CA)

## 2021-03-28 LAB — TSH: TSH: 1.1 u[IU]/mL (ref 0.450–4.500)

## 2021-03-30 ENCOUNTER — Ambulatory Visit (INDEPENDENT_AMBULATORY_CARE_PROVIDER_SITE_OTHER): Payer: PPO

## 2021-03-30 VITALS — Ht 61.0 in | Wt 156.0 lb

## 2021-03-30 DIAGNOSIS — Z0000 Encounter for general adult medical examination without abnormal findings: Secondary | ICD-10-CM

## 2021-03-30 NOTE — Progress Notes (Signed)
Subjective:   Brooke Beasley is a 87 y.o. female who presents for Medicare Annual (Subsequent) preventive examination.  I connected with Brooke Beasley today by telephone and verified that I am speaking with the correct person using two identifiers. Location patient: home Location provider: work Persons participating in the virtual visit: patient, [name].    I discussed the limitations, risks, security and privacy concerns of performing an evaluation and management service by telephone and the availability of in person appointments. I also discussed with the patient that there may be a patient responsible charge related to this service. The patient expressed understanding and verbally consented to this telephonic visit.    Interactive audio and video telecommunications were attempted between this provider and patient, however failed, due to patient having technical difficulties OR patient did not have access to video capability.  We continued and completed visit with audio only.  Some vital signs may be absent or patient reported.   Time Spent with patient on telephone encounter: 25 minutes  Review of Systems     Cardiac Risk Factors include: advanced age (>68men, >69 women);dyslipidemia;hypertension     Objective:    Today's Vitals   03/30/21 1357  Weight: 156 lb (70.8 kg)  Height: 5\' 1"  (1.549 m)  PainSc: 2    Body mass index is 29.48 kg/m.  Advanced Directives 03/30/2021 01/15/2021 01/14/2021 10/27/2020 09/10/2019 03/18/2019 10/20/2018  Does Patient Have a Medical Advance Directive? Yes Yes Yes No Yes Yes Yes  Type of Paramedic of [place];Living will [place];Living will [place];Living will - Living will [place];Living will [place];Living will  Does patient want to make changes to medical advance directive? Yes (MAU/Ambulatory/Procedural Areas - Information given) No -  Patient declined No - Patient declined - - - No - Patient declined  Copy of [place] in Chart? - - No - copy requested, Physician notified - - - No - copy requested  Would patient like information on creating a medical advance directive? - - - No - Patient declined - - -  Pre-existing out of facility DNR order (yellow form or pink MOST form) - - - - - - -    Current Medications (verified) Outpatient Encounter Medications as of 03/30/2021  Medication Sig   acetaminophen (TYLENOL) 500 MG tablet Take 500-1,000 mg by mouth every 6 (six) hours as needed for moderate pain. Depends on pain   albuterol (PROVENTIL HFA;VENTOLIN HFA) 108 (90 Base) MCG/ACT inhaler Inhale 2 puffs into the lungs every 6 (six) hours as needed for wheezing or shortness of breath.   bimatoprost (LUMIGAN) 0.01 % SOLN Place 1 drop into both eyes at bedtime.   Bioflavonoid Products (VITAMIN C) CHEW Chew 1 tablet by mouth daily.   bisacodyl (DULCOLAX) 5 MG EC tablet Take 1 tablet (5 mg total) by mouth daily as needed for moderate constipation.   cholecalciferol (VITAMIN D) 1000 units tablet Take 1,000 Units by mouth daily.   clonazePAM (KLONOPIN) 0.5 MG tablet TAKE 1 TABLET BY MOUTH AT BEDTIME   colchicine 0.6 MG tablet Take 1 tablet (0.6 mg total) by mouth daily as needed (gout flare).   denosumab (PROLIA) 60 MG/ML SOSY injection Inject 60 mg into the skin every 6 (six) months.   docusate sodium (COLACE) 100 MG capsule Take 1 capsule (100 mg total) by mouth 2 (two) times daily.   EUTHYROX 100 MCG tablet Take 1 tablet by mouth  once daily   Famotidine-Ca Carb-Mag Hydrox (PEPCID COMPLETE PO) Take 1 tablet by mouth daily as needed (indigestion).   furosemide (LASIX) 20 MG tablet Take 20 mg by mouth as needed.   hydrocortisone 2.5 % cream Place 1 application rectally daily as needed (hemorrhoids).   Inulin (FIBER CHOICE PO) Take 1 tablet by mouth daily.   irbesartan (AVAPRO) 150 MG tablet Take 1 tablet (150 mg  total) by mouth daily.   Iron-FA-B Cmp-C-Biot-Probiotic (FUSION PLUS) CAPS Take 1 capsule by mouth daily.   Misc Natural Products (OSTEO BI-FLEX TRIPLE STRENGTH PO) Take 1 tablet by mouth daily. With Vit D   ondansetron (ZOFRAN) 4 MG tablet Take 1 tablet (4 mg total) by mouth every 8 (eight) hours as needed for nausea or vomiting.   polyethylene glycol (MIRALAX / GLYCOLAX) 17 g packet Take 17 g by mouth daily.   promethazine-phenylephrine (PROMETHAZINE VC) 6.25-5 MG/5ML SYRP Take 5 mLs by mouth every 4 (four) hours as needed for congestion.   sertraline (ZOLOFT) 100 MG tablet Take 100 mg by mouth in the morning.   Simethicone 180 MG CAPS Take 1 capsule by mouth daily as needed (indigestion).   spironolactone (ALDACTONE) 25 MG tablet Take 1 tablet (25 mg total) by mouth daily.   TURMERIC PO Take 1 tablet by mouth daily.   VITAMIN E PO Take 1 tablet by mouth daily.   VOLTAREN 1 % GEL APPLY ONE APPLICATION TOPICALLY 3 TIMES DAILY   Wheat Dextrin (BENEFIBER DRINK MIX PO) Take 15 mLs by mouth daily.   No facility-administered encounter medications on file as of 03/30/2021.    Allergies (verified) Amlodipine, Prednisone, Ace inhibitors, Alendronate sodium, Carvedilol, Losartan, Other, Paroxetine hcl, Risedronate sodium, Silenor [doxepin hcl], Simvastatin, Sulfonamide derivatives, Toprol xl [metoprolol], Tramadol, and Influenza vaccines   History: Past Medical History:  Diagnosis Date   Anxiety    C. difficile diarrhea 07/11/2018   S/p multiple oral vanc treatments (course and tapers) and completed Zinplava monoclonal Ab treatment (05/10/2019) Fecal transplant on hold during COVID pandemic   CAP (community acquired pneumonia) 12/22/2017   Carotid stenosis 10/18/2014   R 1-39%, L 40-59%, rpt 1 yr (09/2014)    CKD (chronic kidney disease) stage 3, GFR 30-59 ml/min (HCC) 08/02/2014   Clostridioides difficile infection    hx 2020   Degenerative disc disease    LS   Depression    nervous  breakdonw in 1964-out of work for a year   Diverticulosis    severe by colonoscopy   Family history of adverse reaction to anesthesia    n/v   GERD (gastroesophageal reflux disease)    Glaucoma    Heart murmur 08/2011   mitral regurge - on echo    History of hiatal hernia    History of shingles    HTN (hypertension)    Hyperlipidemia    Hypertension    Hypothyroidism    IBS 11/02/2006   Intestinal bacterial overgrowth    In small colon   Lower GI bleed 12/2020   thought diverticular complicated by ABLA with syncope and orbital fracture s/p hospitalization   Maxillary fracture ([place]) 04/08/2012   Osteoarthritis 2016   ([facility])   Osteoporosis    dexa 2011   Pseudogout 2016   shoulders ([place])   Pseudogout    Past Surgical History:  Procedure Laterality Date   barium enema  2012   severe diverticulosis, redundant colon   Bowel obstruction  1999   no surgery in hosp x 3 days  COLONOSCOPY  1. 1999  2. 11/04   1. Not finished  2. Slight hemorrhage rectosigmoid area, severe sig diverticulosis   COLONOSCOPY WITH PROPOFOL N/A 09/10/2019   SSP with dysplasia, TA, rpt 3 yrs Marius Ditch, Tally Due, MD)   Dexa  1. [phone]   2. 9/04  3. 3/08    1. OP  2. OP, borderline, spine -2.44T  3. decreased BMD-OP   DEXA  2011   OP in spine   DG KNEE 1-2 VIEWS BILAT     LS x-ray with degenerative disc and facet change   ESOPHAGOGASTRODUODENOSCOPY     Negative   FLEXIBLE SIGMOIDOSCOPY N/A 01/16/2021   Procedure: FLEXIBLE SIGMOIDOSCOPY;  Surgeon: Gatha Mayer, MD;  Location: [facility];  Service: Endoscopy;  Laterality: N/A;  or unsedated   Hemorrhoid procedure  07/2006   JOINT REPLACEMENT     TONSILLECTOMY     TOTAL SHOULDER ARTHROPLASTY Left 11/27/2015   Corky Mull, MD   TOTAL SHOULDER REVISION Left 03/16/2016   Procedure: TOTAL SHOULDER REVISION;  Surgeon: Corky Mull, MD;  Location: [facility];  Service: Orthopedics;  Laterality: Left;   US ECHOCARDIOGRAPHY  [id number]   Normal  systolic fxn with EF 47-65%.  Focal basal septal hypertrophy.  Mild diastolic dysfunction.  Mild MR.   Family History  Problem Relation Age of Onset   Diabetes Brother        post-op   Coronary artery disease Brother    Diabetes Paternal Grandfather    Breast cancer Cousin    Social History   Socioeconomic History   Marital status: Widowed    Spouse name: Not on file   Number of children: 2   Years of education: Not on file   Highest education level: Not on file  Occupational History   Occupation: Retired    Fish farm manager: RETIRED  Tobacco Use   Smoking status: Never   Smokeless tobacco: Never  Vaping Use   Vaping Use: Never used  Substance and Sexual Activity   Alcohol use: No   Drug use: No   Sexual activity: Not Currently  Other Topics Concern   Not on file  Social History Narrative   Left handed   Widow. Husband ([name]) deceased 2016/03/28 from dementia. She was caregiver.    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   Activity: no regular exercise   Diet: good water, fruits/vegetables daily   Social Determinants of Radio broadcast assistant Strain: Low Risk    Difficulty of Paying Living Expenses: Not very hard  Food Insecurity: No Food Insecurity   Worried About Charity fundraiser in the Last Year: Never true   Arboriculturist in the Last Year: Never true  Transportation Needs: No Transportation Needs   Lack of Transportation (Medical): No   Lack of Transportation (Non-Medical): No  Physical Activity: Insufficiently Active   Days of Exercise per Week: 7 days   Minutes of Exercise per Session: 10 min  Stress: No Stress Concern Present   Feeling of Stress : Only a little  Social Connections: Socially Isolated   Frequency of Communication with Friends and Family: More than three times a week   Frequency of Social Gatherings with Friends and Family: Three times a week   Attends Religious  Services: Never   Active Member of Clubs or Organizations: No   Attends Archivist Meetings: Never  Marital Status: Widowed    Tobacco Counseling Counseling given: Not Answered   Clinical Intake:  Pre-visit preparation completed: Yes  Pain : 0-10 Pain Score: 2  Pain Location: Shoulder Pain Orientation: Right, Left     BMI - recorded: 29.51 Nutritional Status: BMI 25 -29 Overweight Nutritional Risks: None Diabetes: No  How often do you need to have someone help you when you read instructions, pamphlets, or other written materials from your doctor or pharmacy?: 1 - Never  Diabetic?No  Interpreter Needed?: No  Information entered by :: Orrin Brigham LPN   Activities of Daily Living In your present state of health, do you have any difficulty performing the following activities: 03/30/2021 01/15/2021  Hearing? Y -  Comment wears hearing aids -  Vision? N -  Difficulty concentrating or making decisions? N -  Walking or climbing stairs? N -  Dressing or bathing? N -  Doing errands, shopping? Aggie Moats  Comment daughter assist -  Conservation officer, nature and eating ? N -  Using the Toilet? N -  In the past six months, have you accidently leaked urine? Y -  Do you have problems with loss of bowel control? Y -  Comment patient states having loss of bowels with diverticulosis -  Managing your Medications? N -  Managing your Finances? N -  Housekeeping or managing your Housekeeping? N -  Some recent data might be hidden    Patient Care Team: [name], MD as PCP - General (Family Medicine) Dannielle Karvonen, RN as [place] any recent [place] you may have received from other than [facility] providers in the past year (date may be approximate).     Assessment:   This is a routine wellness examination for [place].  Hearing/Vision screen Hearing Screening - Comments:: Wears hearing aids Vision Screening - Comments:: Last  exam 01/2021, Dr. Jerline Pain   Dietary issues and exercise activities discussed: Current Exercise Habits: Home exercise routine, Type of exercise: walking (walks in backyard), Time (Minutes): 10, Frequency (Times/Week): 7, Weekly Exercise (Minutes/Week): 70, Intensity: Mild   Goals Addressed             This Visit's Progress    Patient Stated       Would like to maintain current health status       Depression Screen PHQ 2/9 Scores 03/30/2021 03/28/2020 08/17/2019 10/20/2018 10/13/2017 10/08/2016 10/07/2015  PHQ - 2 Score 0 1 2 0 2 1 0  PHQ- 9 Score - 5 9 0 11 7 -    Fall Risk Fall Risk  03/30/2021 03/28/2020 10/20/2018 10/13/2017 10/08/2016  Falls in the past year? 1 0 0 Yes No  Comment - - - pt fell after losing balance -  Number falls in past yr: 0 - - 1 -  Injury with Fall? 1 - - No -  Comment facial fracture - - - -  Risk for fall due to : Other (Comment) - - Impaired balance/gait -  Risk for fall due to: Comment GI bleeding - - - -  Follow up Falls prevention discussed - - - -    FALL RISK PREVENTION PERTAINING TO THE HOME:  Any stairs in or around the home? Yes  If so, are there any without handrails? No  Home free of loose throw rugs in walkways, pet beds, electrical cords, etc? Yes  Adequate lighting in your home to reduce risk of falls? Yes   ASSISTIVE DEVICES UTILIZED TO PREVENT FALLS:  Life alert? No  Use of a cane, walker or w/c? Yes , walking stick when outside Grab bars in the bathroom? Yes  Shower chair or bench in shower? Yes  Elevated toilet seat or a handicapped toilet? Yes   TIMED UP AND GO:  Was the test performed? No , visit completed over the phone.     Cognitive Function: Normal cognitive status assessed by this Nurse Health Advisor. No abnormalities found.   MMSE - Mini Mental State Exam 10/20/2018 10/13/2017 10/08/2016  Orientation to time 5 5 5   Orientation to Place 5 5 5   Registration 3 3 3   Attention/ Calculation 0 0 0  Recall 3 1 3    Recall-comments - unable to recall 2 of 3 words -  Language- name 2 objects 0 0 0  Language- repeat 1 1 1   Language- follow 3 step command 0 3 3  Language- read & follow direction 0 0 0  Write a sentence 0 0 0  Copy design 0 0 0  Total score 17 18 20         Immunizations Immunization History  Administered Date(s) Administered   PFIZER(Purple Top)SARS-COV-2 Vaccination 08/10/2019, 08/31/2019   Pneumococcal Conjugate-13 06/08/2013   Pneumococcal Polysaccharide-23 04/26/2002   Td 04/26/1998, 10/22/2008   Zoster, Live 05/11/2010    TDAP status: Due, Education has been provided regarding the importance of this vaccine. Advised may receive this vaccine at local pharmacy or Health Dept. Aware to provide a copy of the vaccination record if obtained from local pharmacy or Health Dept. Verbalized acceptance and understanding.  Flu Vaccine status: Declined, Education has been provided regarding the importance of this vaccine but patient still declined. Advised may receive this vaccine at local pharmacy or Health Dept. Aware to provide a copy of the vaccination record if obtained from local pharmacy or Health Dept. Verbalized acceptance and understanding.  Pneumococcal vaccine status: Up to date  Covid-19 vaccine status: Information provided on how to obtain vaccines.   Qualifies for Shingles Vaccine? Yes   Zostavax completed Yes   Shingrix Completed?: No.    Education has been provided regarding the importance of this vaccine. Patient has been advised to call insurance company to determine out of pocket expense if they have not yet received this vaccine. Advised may also receive vaccine at local pharmacy or Health Dept. Verbalized acceptance and understanding.  Screening Tests Health Maintenance  Topic Date Due   Zoster Vaccines- Shingrix (1 of 2) Never done   TETANUS/TDAP  10/23/2018   COVID-19 Vaccine (3 - Booster for Pfizer series) 10/26/2019   INFLUENZA VACCINE  07/24/2021  (Originally 11/24/2020)   MAMMOGRAM  08/22/2021   COLONOSCOPY (Pts 45-70yrs Insurance coverage will need to be confirmed)  09/10/2022   Pneumonia Vaccine 80+ Years old  Completed   DEXA SCAN  Completed   HPV VACCINES  Aged Out    Health Maintenance  Health Maintenance Due  Topic Date Due   Zoster Vaccines- Shingrix (1 of 2) Never done   TETANUS/TDAP  10/23/2018   COVID-19 Vaccine (3 - Booster for Pfizer series) 10/26/2019    Colorectal cancer screening: No longer required.   Mammogram status: Completed 08/22/20. Repeat every year  Bone Density status: Completed 08/22/20. Results reflect: Bone density results: OSTEOPOROSIS. Repeat every 2 years.  Lung Cancer Screening: (Low Dose CT Chest recommended if Age 81-80 years, 30 pack-year currently smoking OR have quit w/in 15years.) does not qualify.    Additional Screening:  Hepatitis C Screening: does not qualify  Vision Screening: Recommended annual ophthalmology exams for early detection of glaucoma and other disorders of the eye. Is the patient up to date with their annual eye exam?  Yes  Who is the provider or what is the name of the office in which the patient attends annual eye exams? Dr. Jerline Pain   Dental Screening: Recommended annual dental exams for proper oral hygiene  Community Resource Referral / Chronic Care Management: CRR required this visit?  No   CCM required this visit?  No      Plan:     I have personally reviewed and noted the following in the patient's chart:   Medical and social history Use of alcohol, tobacco or illicit drugs  Current medications and supplements including opioid prescriptions.  Functional ability and status Nutritional status Physical activity Advanced directives List of other physicians Hospitalizations, surgeries, and ER visits in previous 12 months Vitals Screenings to include cognitive, depression, and falls Referrals and appointments  In addition, I have reviewed and  discussed with patient certain preventive protocols, quality metrics, and best practice recommendations. A written personalized care plan for preventive services as well as general preventive health recommendations were provided to patient.   Due to this being a telephonic visit, the after visit summary with patients personalized plan was offered to patient via mail or my-chart. Patient would like to access on my-chart     Loma Messing, LPN   [birth year]   Nurse Health Advisor  Nurse Notes: none

## 2021-03-30 NOTE — Patient Instructions (Signed)
Brooke Beasley , Thank you for taking time to complete your Medicare Wellness Visit. I appreciate your ongoing commitment to your health goals. Please review the following plan we discussed and let me know if I can assist you in the future.   Screening recommendations/referrals: Colonoscopy: no longer required  Mammogram: up to date , completed 08/22/20, due 08/22/21 Bone Density: up to date, complete 08/22/20, due 08/23/22 Recommended yearly ophthalmology/optometry visit for glaucoma screening and checkup Recommended yearly dental visit for hygiene and checkup  Vaccinations: Influenza vaccine: Declined today, please call office or local pharmacy to schedule if you change your mind Pneumococcal vaccine: up to date Tdap vaccine: due, last completed 10/22/08, due 10/23/18, contact your local pharmacy to schedule Shingles vaccine: May obtain vaccine at your local pharmacy.   Covid-19: newest booster available at your local pharmacy  Advanced directives: Please bring a copy of Living Will and/or [place] for your chart.   Conditions/risks identified: see problem list  Next appointment: Follow up in one year for your annual wellness visit    Preventive Care 65 Years and Older, Female Preventive care refers to lifestyle choices and visits with your health care provider that can promote health and wellness. What does preventive care include? A yearly physical exam. This is also called an annual well check. Dental exams once or twice a year. Routine eye exams. Ask your health care provider how often you should have your eyes checked. Personal lifestyle choices, including: Daily care of your teeth and gums. Regular physical activity. Eating a healthy diet. Avoiding tobacco and drug use. Limiting alcohol use. Practicing safe sex. Taking low-dose aspirin every day. Taking vitamin and mineral supplements as recommended by your health care provider. What happens during an annual  well check? The services and screenings done by your health care provider during your annual well check will depend on your age, overall health, lifestyle risk factors, and family history of disease. Counseling  Your health care provider may ask you questions about your: Alcohol use. Tobacco use. Drug use. Emotional well-being. Home and relationship well-being. Sexual activity. Eating habits. History of falls. Memory and ability to understand (cognition). Work and work Statistician. Reproductive health. Screening  You may have the following tests or measurements: Height, weight, and BMI. Blood pressure. Lipid and cholesterol levels. These may be checked every 5 years, or more frequently if you are over 59 years old. Skin check. Lung cancer screening. You may have this screening every year starting at age 26 if you have a 30-pack-year history of smoking and currently smoke or have quit within the past 15 years. Fecal occult blood test (FOBT) of the stool. You may have this test every year starting at age 29. Flexible sigmoidoscopy or colonoscopy. You may have a sigmoidoscopy every 5 years or a colonoscopy every 10 years starting at age 15. Hepatitis C blood test. Hepatitis B blood test. Sexually transmitted disease (STD) testing. Diabetes screening. This is done by checking your blood sugar (glucose) after you have not eaten for a while (fasting). You may have this done every 1-3 years. Bone density scan. This is done to screen for osteoporosis. You may have this done starting at age 28. Mammogram. This may be done every 1-2 years. Talk to your health care provider about how often you should have regular mammograms. Talk with your health care provider about your test results, treatment options, and if necessary, the need for more tests. Vaccines  Your health care provider may recommend  certain vaccines, such as: Influenza vaccine. This is recommended every year. Tetanus, diphtheria,  and acellular pertussis (Tdap, Td) vaccine. You may need a Td booster every 10 years. Zoster vaccine. You may need this after age 46. Pneumococcal 13-valent conjugate (PCV13) vaccine. One dose is recommended after age 28. Pneumococcal polysaccharide (PPSV23) vaccine. One dose is recommended after age 32. Talk to your health care provider about which screenings and vaccines you need and how often you need them. This information is not intended to replace advice given to you by your health care provider. Make sure you discuss any questions you have with your health care provider. Document Released: 05/09/2015 Document Revised: 12/31/2015 Document Reviewed: 02/11/2015 Elsevier Interactive Patient Education  2017 [place] Prevention in the Home Falls can cause injuries. They can happen to people of all ages. There are many things you can do to make your home safe and to help prevent falls. What can I do on the outside of my home? Regularly fix the edges of walkways and driveways and fix any cracks. Remove anything that might make you trip as you walk through a door, such as a raised step or threshold. Trim any bushes or trees on the path to your home. Use bright outdoor lighting. Clear any walking paths of anything that might make someone trip, such as rocks or tools. Regularly check to see if handrails are loose or broken. Make sure that both sides of any steps have handrails. Any raised decks and porches should have guardrails on the edges. Have any leaves, snow, or ice cleared regularly. Use sand or salt on walking paths during winter. Clean up any spills in your garage right away. This includes oil or grease spills. What can I do in the bathroom? Use night lights. Install grab bars by the toilet and in the tub and shower. Do not use towel bars as grab bars. Use non-skid mats or decals in the tub or shower. If you need to sit down in the shower, use a plastic, non-slip  stool. Keep the floor dry. Clean up any water that spills on the floor as soon as it happens. Remove soap buildup in the tub or shower regularly. Attach bath mats securely with double-sided non-slip rug tape. Do not have throw rugs and other things on the floor that can make you trip. What can I do in the bedroom? Use night lights. Make sure that you have a light by your bed that is easy to reach. Do not use any sheets or blankets that are too big for your bed. They should not hang down onto the floor. Have a firm chair that has side arms. You can use this for support while you get dressed. Do not have throw rugs and other things on the floor that can make you trip. What can I do in the kitchen? Clean up any spills right away. Avoid walking on wet floors. Keep items that you use a lot in easy-to-reach places. If you need to reach something above you, use a strong step stool that has a grab bar. Keep electrical cords out of the way. Do not use floor polish or wax that makes floors slippery. If you must use wax, use non-skid floor wax. Do not have throw rugs and other things on the floor that can make you trip. What can I do with my stairs? Do not leave any items on the stairs. Make sure that there are handrails on both sides of the  stairs and use them. Fix handrails that are broken or loose. Make sure that handrails are as long as the stairways. Check any carpeting to make sure that it is firmly attached to the stairs. Fix any carpet that is loose or worn. Avoid having throw rugs at the top or bottom of the stairs. If you do have throw rugs, attach them to the floor with carpet tape. Make sure that you have a light switch at the top of the stairs and the bottom of the stairs. If you do not have them, ask someone to add them for you. What else can I do to help prevent falls? Wear shoes that: Do not have high heels. Have rubber bottoms. Are comfortable and fit you well. Are closed at the  toe. Do not wear sandals. If you use a stepladder: Make sure that it is fully opened. Do not climb a closed stepladder. Make sure that both sides of the stepladder are locked into place. Ask someone to hold it for you, if possible. Clearly mark and make sure that you can see: Any grab bars or handrails. First and last steps. Where the edge of each step is. Use tools that help you move around (mobility aids) if they are needed. These include: Canes. Walkers. Scooters. Crutches. Turn on the lights when you go into a dark area. Replace any light bulbs as soon as they burn out. Set up your furniture so you have a clear path. Avoid moving your furniture around. If any of your floors are uneven, fix them. If there are any pets around you, be aware of where they are. Review your medicines with your doctor. Some medicines can make you feel dizzy. This can increase your chance of falling. Ask your doctor what other things that you can do to help prevent falls. This information is not intended to replace advice given to you by your health care provider. Make sure you discuss any questions you have with your health care provider. Document Released: 02/06/2009 Document Revised: 09/18/2015 Document Reviewed: 05/17/2014 Elsevier Interactive Patient Education  2017 [employer].

## 2021-03-31 ENCOUNTER — Other Ambulatory Visit: Payer: Self-pay

## 2021-03-31 ENCOUNTER — Ambulatory Visit (INDEPENDENT_AMBULATORY_CARE_PROVIDER_SITE_OTHER): Payer: PPO | Admitting: Family Medicine

## 2021-03-31 ENCOUNTER — Encounter: Payer: Self-pay | Admitting: Family Medicine

## 2021-03-31 VITALS — BP 136/72 | HR 65 | Temp 97.6°F | Ht 61.5 in | Wt 155.4 lb

## 2021-03-31 DIAGNOSIS — K219 Gastro-esophageal reflux disease without esophagitis: Secondary | ICD-10-CM | POA: Diagnosis not present

## 2021-03-31 DIAGNOSIS — F418 Other specified anxiety disorders: Secondary | ICD-10-CM

## 2021-03-31 DIAGNOSIS — I6529 Occlusion and stenosis of unspecified carotid artery: Secondary | ICD-10-CM

## 2021-03-31 DIAGNOSIS — K581 Irritable bowel syndrome with constipation: Secondary | ICD-10-CM

## 2021-03-31 DIAGNOSIS — M1120 Other chondrocalcinosis, unspecified site: Secondary | ICD-10-CM

## 2021-03-31 DIAGNOSIS — M810 Age-related osteoporosis without current pathological fracture: Secondary | ICD-10-CM | POA: Diagnosis not present

## 2021-03-31 DIAGNOSIS — E785 Hyperlipidemia, unspecified: Secondary | ICD-10-CM | POA: Diagnosis not present

## 2021-03-31 DIAGNOSIS — S0285XD Fracture of orbit, unspecified, subsequent encounter for fracture with routine healing: Secondary | ICD-10-CM | POA: Diagnosis not present

## 2021-03-31 DIAGNOSIS — I10 Essential (primary) hypertension: Secondary | ICD-10-CM

## 2021-03-31 DIAGNOSIS — E039 Hypothyroidism, unspecified: Secondary | ICD-10-CM | POA: Diagnosis not present

## 2021-03-31 DIAGNOSIS — H409 Unspecified glaucoma: Secondary | ICD-10-CM | POA: Diagnosis not present

## 2021-03-31 DIAGNOSIS — D508 Other iron deficiency anemias: Secondary | ICD-10-CM

## 2021-03-31 DIAGNOSIS — N1831 Chronic kidney disease, stage 3a: Secondary | ICD-10-CM

## 2021-03-31 DIAGNOSIS — Z0000 Encounter for general adult medical examination without abnormal findings: Secondary | ICD-10-CM | POA: Diagnosis not present

## 2021-03-31 DIAGNOSIS — L299 Pruritus, unspecified: Secondary | ICD-10-CM | POA: Insufficient documentation

## 2021-03-31 MED ORDER — SPIRONOLACTONE 25 MG PO TABS: 25.0000 mg | ORAL_TABLET | Freq: Every day | ORAL | 3 refills | Status: DC

## 2021-03-31 MED ORDER — IRBESARTAN 150 MG PO TABS
150.0000 mg | ORAL_TABLET | Freq: Every day | ORAL | 3 refills | Status: DC
Start: 2021-03-31 — End: 2021-08-06

## 2021-03-31 NOTE — Assessment & Plan Note (Signed)
Significant osteoporosis.  Last prolia 10/2019 then became unaffordable.  May be interested in restarting prolia - requests we price out again.

## 2021-03-31 NOTE — Assessment & Plan Note (Signed)
Established with rheumatology [facility]), started on hydroxychloroquine however stopped after this caused itching. Continues tramadol - discussed regular use. They will let me know if refill needed.

## 2021-03-31 NOTE — Assessment & Plan Note (Signed)
Regularly sees eye doctor to follow glaucoma.

## 2021-03-31 NOTE — Assessment & Plan Note (Signed)
Chronic, stable. Continue levothyroxine. 

## 2021-03-31 NOTE — Patient Instructions (Addendum)
I will ask to re-price out prolia.  If interested, check with pharmacy about new 2 shot shingles series (shingrix).  I think itching was due to hydroxychloroquine - this should continue to improve. Continue Cera-ve itch cream.  Restart fusion iron per GI recommendations.  Good to see you today Return as needed or in 6 months for follow up visit.   Health Maintenance After Age 85 After age 87, you are at a higher risk for certain long-term diseases and infections as well as injuries from falls. Falls are a major cause of broken bones and head injuries in people who are older than age 45. Getting regular preventive care can help to keep you healthy and well. Preventive care includes getting regular testing and making lifestyle changes as recommended by your health care provider. Talk with your health care provider about: Which screenings and tests you should have. A screening is a test that checks for a disease when you have no symptoms. A diet and exercise plan that is right for you. What should I know about screenings and tests to prevent falls? Screening and testing are the best ways to find a health problem early. Early diagnosis and treatment give you the best chance of managing medical conditions that are common after age 68. Certain conditions and lifestyle choices may make you more likely to have a fall. Your health care provider may recommend: Regular vision checks. Poor vision and conditions such as cataracts can make you more likely to have a fall. If you wear glasses, make sure to get your prescription updated if your vision changes. Medicine review. Work with your health care provider to regularly review all of the medicines you are taking, including over-the-counter medicines. Ask your health care provider about any side effects that may make you more likely to have a fall. Tell your health care provider if any medicines that you take make you feel dizzy or sleepy. Strength and balance  checks. Your health care provider may recommend certain tests to check your strength and balance while standing, walking, or changing positions. Foot health exam. Foot pain and numbness, as well as not wearing proper footwear, can make you more likely to have a fall. Screenings, including: Osteoporosis screening. Osteoporosis is a condition that causes the bones to get weaker and break more easily. Blood pressure screening. Blood pressure changes and medicines to control blood pressure can make you feel dizzy. Depression screening. You may be more likely to have a fall if you have a fear of falling, feel depressed, or feel unable to do activities that you used to do. Alcohol use screening. Using too much alcohol can affect your balance and may make you more likely to have a fall. Follow these instructions at home: Lifestyle Do not drink alcohol if: Your health care provider tells you not to drink. If you drink alcohol: Limit how much you have to: 0-1 drink a day for women. 0-2 drinks a day for men. Know how much alcohol is in your drink. In the [place] one drink equals one 12 oz bottle of beer (355 mL), one 5 oz glass of wine (148 mL), or one 1 oz glass of hard liquor (44 mL). Do not use any products that contain nicotine or tobacco. These products include cigarettes, chewing tobacco, and vaping devices, such as e-cigarettes. If you need help quitting, ask your health care provider. Activity  Follow a regular exercise program to stay fit. This will help you maintain your balance. Ask your  health care provider what types of exercise are appropriate for you. If you need a cane or walker, use it as recommended by your health care provider. Wear supportive shoes that have nonskid soles. Safety  Remove any tripping hazards, such as rugs, cords, and clutter. Install safety equipment such as grab bars in bathrooms and safety rails on stairs. Keep rooms and walkways well-lit. General  instructions Talk with your health care provider about your risks for falling. Tell your health care provider if: You fall. Be sure to tell your health care provider about all falls, even ones that seem minor. You feel dizzy, tiredness (fatigue), or off-balance. Take over-the-counter and prescription medicines only as told by your health care provider. These include supplements. Eat a healthy diet and maintain a healthy weight. A healthy diet includes low-fat dairy products, low-fat (lean) meats, and fiber from whole grains, beans, and lots of fruits and vegetables. Stay current with your vaccines. Schedule regular health, dental, and eye exams. Summary Having a healthy lifestyle and getting preventive care can help to protect your health and wellness after age 13. Screening and testing are the best way to find a health problem early and help you avoid having a fall. Early diagnosis and treatment give you the best chance for managing medical conditions that are more common for people who are older than age 26. Falls are a major cause of broken bones and head injuries in people who are older than age 32. Take precautions to prevent a fall at home. Work with your health care provider to learn what changes you can make to improve your health and wellness and to prevent falls. This information is not intended to replace advice given to you by your health care provider. Make sure you discuss any questions you have with your health care provider. Document Revised: 09/01/2020 Document Reviewed: 09/01/2020 Elsevier Patient Education  [place].

## 2021-03-31 NOTE — Assessment & Plan Note (Addendum)
Chronic, stable on current regimen of irbesartan and spironolactone - they request refill of both

## 2021-03-31 NOTE — Assessment & Plan Note (Signed)
Significant improvement on fusion iron - will continue this and recheck iron panel next labwork.

## 2021-03-31 NOTE — Assessment & Plan Note (Signed)
No bruits appreciated.  

## 2021-03-31 NOTE — Progress Notes (Signed)
Patient ID: Brooke Beasley, female    DOB: 12-01-1930, 87 y.o.   MRN: [id number]  This visit was conducted in person.  BP 136/72   Pulse 65   Temp 97.6 F (36.4 C) (Temporal)   Ht 5' 1.5" (1.562 m)   Wt 155 lb 7 oz (70.5 kg)   SpO2 97%   BMI 28.89 kg/m    CC: CPE Subjective:   HPI: Brooke Beasley is a 87 y.o. female presenting on 03/31/2021 for Annual Exam [facility] prt 2. Accompanied by daughter, Brooke Beasley.  Wants rx for spironolactone and irbesartan sent to [facility] order. )   Saw health advisor yesterday for medicare wellness visit. Note reviewed.    No results found.  Flowsheet Row Clinical Support from 03/30/2021 in [place] at [place]  PHQ-2 Total Score 0       Fall Risk  03/30/2021 03/28/2020 10/20/2018 10/13/2017 10/08/2016  Falls in the past year? 1 0 0 Yes No  Comment - - - pt fell after losing balance -  Number falls in past yr: 0 - - 1 -  Injury with Fall? 1 - - No -  Comment facial fracture - - - -  Risk for fall due to : Other (Comment) - - Impaired balance/gait -  Risk for fall due to: Comment GI bleeding - - - -  Follow up Falls prevention discussed - - - -  Fall end of 01/2021 after GI bleed. Maxillary fracture healing well.    Recent hospitalization for lower GI bleed from hemorrhoids and diverticulosis.  Difficult to control HTN - stable period on irbesartan 144m daily, aldactone 248mdaily and furosemide as needed. Sees cardiology.   Recent echo - mild aortic stenosis, unchanged from prior.   Established with rheumatology Dr PaPosey Prontoor pseudogout  and osteoarthritis - started on hydroxychloroquine 20065maily (01/2021) with planned f/u early next year. Plaquenil helped arthritis however exacerbated itching. Also using tramadol sparingly. Advised let me know if needs more tramadol refill.    Preventative: Colonoscopy 08/2019 - 2 SSP, TA, hemorrhoids, diverticulosis, rectal prolapse ([place]) Well woman - last done about 12 yrs  ago. Aged out.  [facility]  DEXA - 2011 osteoporosis. Pt does not take calcium (causes constipation) but does take vit D. Fosamax caused bone pain. prolia started 04/2019, latest shot 11/08/2019. Didn't take 2022 due to cost.  DEXA forearm T -4.2, hip -1.4 (01/2018) DEXA 07/2020 - T -4.3 at forearm, -1.3 at L femoral neck Flu shot - declines - nervous breakdown after flu shot remotely.  [place], 08[place], no booster Pneumovax 2004.  Prevnar-13 05/2013.  Td 2010  Zostavax 2012  Shingrix - discussed  Advanced directives - scanned into chart 10/2015. Daughter RebAntoine Primasen JanWyline Moode HCPOA. Does not want life sustaining measures if terminally ill.  Seat belt use discussed  Sunscreen use discussed. No changing moles on skin. Saw derm last year  Non smoker Alcohol - none  Dentist - does not see, has full dentures Eye exam - Q3 mo - glaucoma  Bowel - chronic issues  Bladder - intermittent leaking worse with diuretic    Left handed Widow. Husband ([name]) deceased 03/06/26/2017om dementia. She was caregiver.  Local daughter [place]s [place] Was a tobacco farmer-her dad had a farm Activity: no regular exercise  Diet: good water, fruits/vegetables daily  Relevant past medical, surgical, family and social history reviewed and updated as indicated. Interim medical history since our last visit reviewed. Allergies and medications reviewed and updated. Outpatient Medications Prior to Visit  Medication Sig Dispense Refill   acetaminophen (TYLENOL) 500 MG tablet Take 500-1,000 mg by mouth every 6 (six) hours as needed for moderate pain. Depends on pain     albuterol (PROVENTIL HFA;VENTOLIN HFA) 108 (90 Base) MCG/ACT inhaler Inhale 2 puffs into the lungs every 6 (six) hours as needed for wheezing or shortness of breath. 1 Inhaler 0   bimatoprost (LUMIGAN) 0.01 % SOLN  Place 1 drop into both eyes at bedtime.     Bioflavonoid Products (VITAMIN C) CHEW Chew 1 tablet by mouth daily.     bisacodyl (DULCOLAX) 5 MG EC tablet Take 1 tablet (5 mg total) by mouth daily as needed for moderate constipation. 30 tablet 1   brimonidine (ALPHAGAN) 0.15 % ophthalmic solution 1 drop 2 (two) times daily.     cholecalciferol (VITAMIN D) 1000 units tablet Take 1,000 Units by mouth daily.     clonazePAM (KLONOPIN) 0.5 MG tablet TAKE 1 TABLET BY MOUTH AT BEDTIME 30 tablet 0   colchicine 0.6 MG tablet Take 1 tablet (0.6 mg total) by mouth daily as needed (gout flare). 90 tablet 0   denosumab (PROLIA) 60 MG/ML SOSY injection Inject 60 mg into the skin every 6 (six) months. 1 mL 1   docusate sodium (COLACE) 100 MG capsule Take 1 capsule (100 mg total) by mouth 2 (two) times daily. 60 capsule 0   EUTHYROX 100 MCG tablet Take 1 tablet by mouth once daily 90 tablet 2   Famotidine-Ca Carb-Mag Hydrox (PEPCID COMPLETE PO) Take 1 tablet by mouth daily as needed (indigestion).     furosemide (LASIX) 20 MG tablet Take 20 mg by mouth as needed.     hydrocortisone 2.5 % cream Place 1 application rectally daily as needed (hemorrhoids).     Inulin (FIBER CHOICE PO) Take 1 tablet by mouth daily.     Misc Natural Products (OSTEO BI-FLEX TRIPLE STRENGTH PO) Take 1 tablet by mouth daily. With Vit D     Multiple Vitamin (MULTIVITAMIN ADULT PO) Take by mouth daily.     ondansetron (ZOFRAN) 4 MG tablet Take 1 tablet (4 mg total) by mouth every 8 (eight) hours as needed for nausea or vomiting. 20 tablet 0   polyethylene glycol (MIRALAX / GLYCOLAX) 17 g packet Take 17 g by mouth daily.     promethazine-phenylephrine (PROMETHAZINE VC) 6.25-5 MG/5ML SYRP Take 5 mLs by mouth every 4 (four) hours as needed for congestion. 118 mL 0   sertraline (ZOLOFT) 100 MG tablet Take 100 mg by mouth in the morning.     Simethicone 180 MG CAPS Take 1 capsule by mouth daily as needed (indigestion).     traMADol (ULTRAM) 50  MG tablet Take by mouth as needed.     VOLTAREN 1 % GEL APPLY ONE APPLICATION TOPICALLY 3 TIMES DAILY 100 g 3   Wheat Dextrin (BENEFIBER DRINK MIX PO) Take 15 mLs by mouth daily.     irbesartan (AVAPRO) 150 MG tablet Take 1 tablet (150 mg total) by mouth daily. 30 tablet 3   spironolactone (ALDACTONE) 25 MG tablet Take 1 tablet (25 mg total) by mouth daily. 30 tablet 6   hydroxychloroquine (PLAQUENIL) 200 MG tablet Take by mouth daily.     Iron-FA-B Cmp-C-Biot-Probiotic (FUSION PLUS) CAPS Take 1 capsule by mouth daily. 30 capsule  2   TURMERIC PO Take 1 tablet by mouth daily.     VITAMIN E PO Take 1 tablet by mouth daily.     No facility-administered medications prior to visit.     Per HPI unless specifically indicated in ROS section below Review of Systems  Constitutional:  Negative for activity change, appetite change, chills, fatigue, fever and unexpected weight change.  HENT:  Negative for hearing loss.   Eyes:  Positive for itching. Negative for visual disturbance.  Respiratory:  Positive for cough. Negative for chest tightness, shortness of breath and wheezing.   Cardiovascular:  Positive for leg swelling. Negative for chest pain and palpitations.  Gastrointestinal:  Positive for abdominal distention, diarrhea and nausea. Negative for abdominal pain, blood in stool, constipation and vomiting.  Genitourinary:  Negative for difficulty urinating and hematuria.  Musculoskeletal:  Positive for arthralgias. Negative for myalgias and neck pain.       Feet burn when laying down   Skin:  Negative for rash.  Neurological:  Positive for headaches (occ). Negative for dizziness, seizures and syncope.  Hematological:  Negative for adenopathy. Does not bruise/bleed easily.  Psychiatric/Behavioral:  Positive for dysphoric Beasley. The patient is nervous/anxious.    Objective:  BP 136/72   Pulse 65   Temp 97.6 F (36.4 C) (Temporal)   Ht 5' 1.5" (1.562 m)   Wt 155 lb 7 oz (70.5 kg)   SpO2 97%    BMI 28.89 kg/m   Wt Readings from Last 3 Encounters:  03/31/21 155 lb 7 oz (70.5 kg)  03/30/21 156 lb (70.8 kg)  03/27/21 154 lb (69.9 kg)      Physical Exam Vitals and nursing note reviewed.  Constitutional:      Appearance: Normal appearance. She is not ill-appearing.  HENT:     Head: Normocephalic and atraumatic.     Right Ear: Tympanic membrane, ear canal and external ear normal. There is no impacted cerumen.     Left Ear: Tympanic membrane, ear canal and external ear normal. There is no impacted cerumen.  Eyes:     General:        Right eye: No discharge.        Left eye: No discharge.     Extraocular Movements: Extraocular movements intact.     Conjunctiva/sclera: Conjunctivae normal.     Pupils: Pupils are equal, round, and reactive to light.  Neck:     Thyroid: No thyroid mass or thyromegaly.     Vascular: No carotid bruit.  Cardiovascular:     Rate and Rhythm: Normal rate and regular rhythm.     Pulses: Normal pulses.     Heart sounds: Normal heart sounds. No murmur heard. Pulmonary:     Effort: Pulmonary effort is normal. No respiratory distress.     Breath sounds: Normal breath sounds. No wheezing, rhonchi or rales.  Abdominal:     General: Bowel sounds are normal. There is no distension.     Palpations: Abdomen is soft. There is no mass.     Tenderness: There is no abdominal tenderness. There is no guarding or rebound.     Hernia: No hernia is present.  Musculoskeletal:     Cervical back: Normal range of motion and neck supple. No rigidity.     Right lower leg: No edema.     Left lower leg: No edema.  Lymphadenopathy:     Cervical: No cervical adenopathy.  Skin:    General: Skin is warm and dry.  Findings: No rash.  Neurological:     General: No focal deficit present.     Mental Status: She is alert. Mental status is at baseline.  Psychiatric:        Beasley and Affect: Beasley normal.        Behavior: Behavior normal.      Results for orders placed or  performed in visit on 03/27/21  Lipid panel  Result Value Ref Range   Cholesterol, Total 179 100 - 199 mg/dL   Triglycerides 69 0 - 149 mg/dL   HDL 71 >39 mg/dL   VLDL Cholesterol Cal 13 5 - 40 mg/dL   LDL Chol Calc (NIH) 95 0 - 99 mg/dL   Chol/HDL Ratio 2.5 0.0 - 4.4 ratio  Comp Met (CMET)  Result Value Ref Range   Glucose 108 (H) 70 - 99 mg/dL   BUN 22 8 - 27 mg/dL   Creatinine, Ser 1.06 (H) 0.57 - 1.00 mg/dL   eGFR 51 (L) >59 mL/min/1.73   BUN/Creatinine Ratio 21 12 - 28   Sodium 136 134 - 144 mmol/L   Potassium 4.5 3.5 - 5.2 mmol/L   Chloride 97 96 - 106 mmol/L   CO2 25 20 - 29 mmol/L   Calcium 9.8 8.7 - 10.3 mg/dL   Total Protein 6.8 6.0 - 8.5 g/dL   Albumin 4.3 3.6 - 4.6 g/dL   Globulin, Total 2.5 1.5 - 4.5 g/dL   Albumin/Globulin Ratio 1.7 1.2 - 2.2   Bilirubin Total 0.3 0.0 - 1.2 mg/dL   Alkaline Phosphatase 109 44 - 121 IU/L   AST 24 0 - 40 IU/L   ALT 18 0 - 32 IU/L  TSH  Result Value Ref Range   TSH 1.100 0.450 - 4.500 uIU/mL  Vitamin D (25 hydroxy)  Result Value Ref Range   Vit D, 25-Hydroxy 52.1 30.0 - 100.0 ng/mL  Sedimentation rate  Result Value Ref Range   Sed Rate 11 0 - 40 mm/hr  PTH, intact (no Ca)  Result Value Ref Range   PTH CANCELED pg/mL  CBC with Differential/Platelet  Result Value Ref Range   WBC 6.7 3.4 - 10.8 x10E3/uL   RBC 4.88 3.77 - 5.28 x10E6/uL   Hemoglobin 14.0 11.1 - 15.9 g/dL   Hematocrit 44.3 34.0 - 46.6 %   MCV 91 79 - 97 fL   MCH 28.7 26.6 - 33.0 pg   MCHC 31.6 31.5 - 35.7 g/dL   RDW 16.8 (H) 11.7 - 15.4 %   Platelets 274 150 - 450 x10E3/uL   Neutrophils 50 Not Estab. %   Lymphs 28 Not Estab. %   Monocytes 13 Not Estab. %   Eos 8 Not Estab. %   Basos 1 Not Estab. %   Neutrophils Absolute 3.4 1.4 - 7.0 x10E3/uL   Lymphocytes Absolute 1.8 0.7 - 3.1 x10E3/uL   Monocytes Absolute 0.8 0.1 - 0.9 x10E3/uL   EOS (ABSOLUTE) 0.5 (H) 0.0 - 0.4 x10E3/uL   Basophils Absolute 0.1 0.0 - 0.2 x10E3/uL   Immature Granulocytes 0 Not  Estab. %   Immature Grans (Abs) 0.0 0.0 - 0.1 x10E3/uL   Lab Results  Component Value Date   HGBA1C 5.7 (H) 01/15/2021    Depression screen PHQ 2/9 03/30/2021 03/28/2020 08/17/2019 10/20/2018 10/13/2017  Decreased Interest 0 0 1 0 1  Down, Depressed, Hopeless 0 1 1 0 1  PHQ - 2 Score 0 1 2 0 2  Altered sleeping - 2 3 0 3  Tired,  decreased energy - 2 2 0 3  Change in appetite - 0 0 0 0  Feeling bad or failure about yourself  - 0 1 0 0  Trouble concentrating - 0 0 0 0  Moving slowly or fidgety/restless - 0 1 0 3  Suicidal thoughts - 0 0 0 0  PHQ-9 Score - 5 9 0 11  Difficult doing work/chores - - - Not difficult at all Somewhat difficult  Some recent data might be hidden    Assessment & Plan:  This visit occurred during the SARS-CoV-2 public health emergency.  Safety protocols were in place, including screening questions prior to the visit, additional usage of staff PPE, and extensive cleaning of exam room while observing appropriate contact time as indicated for disinfecting solutions.   Problem List Items Addressed This Visit     Health maintenance examination - Primary (Chronic)    Preventative protocols reviewed and updated unless pt declined. Discussed healthy diet and lifestyle.       Hypothyroidism    Chronic, stable. Continue levothyroxine.      HLD (hyperlipidemia)    Chronic, stable period off statin.  The ASCVD Risk score (Arnett DK, et al., 2019) failed to calculate for the following reasons:   The 2019 ASCVD risk score is only valid for ages 27 to 49       Relevant Medications   spironolactone (ALDACTONE) 25 MG tablet   irbesartan (AVAPRO) 150 MG tablet   Glaucoma    Regularly sees eye doctor to follow glaucoma.       Relevant Medications   brimonidine (ALPHAGAN) 0.15 % ophthalmic solution   Hypertension, essential    Chronic, stable on current regimen of irbesartan and spironolactone - they request refill of both       Relevant Medications    spironolactone (ALDACTONE) 25 MG tablet   irbesartan (AVAPRO) 150 MG tablet   Irritable bowel syndrome with constipation   Osteoporosis    Significant osteoporosis.  Last prolia 10/2019 then became unaffordable.  May be interested in restarting prolia - requests we price out again.       Anxiety associated with depression    Stable period on sertraline and klonopin - continue.       GERD (gastroesophageal reflux disease)    Chronic, stable off PPI.       CKD (chronic kidney disease) stage 3, GFR 30-59 ml/min (HCC)    Chronic, stable period on irbesartan.       Carotid stenosis    No bruits appreciated.       Relevant Medications   spironolactone (ALDACTONE) 25 MG tablet   irbesartan (AVAPRO) 150 MG tablet   Pseudogout    Established with rheumatology Posey Pronto), started on hydroxychloroquine however stopped after this caused itching. Continues tramadol - discussed regular use. They will let me know if refill needed.       Relevant Medications   traMADol (ULTRAM) 50 MG tablet   Iron deficiency anemia    Significant improvement on fusion iron - will continue this and recheck iron panel next labwork.       Closed fracture of right orbit ([place])    Saw oral surgeon - this continues healing well.       Pruritus    Acute episode over the last few months correlating to commencement of hydroxychloroquine which can exacerbate pruritis - seems to be improving now that they have stopped this medication. They will reach out to rheum to discuss alternative.  Meds ordered this encounter  Medications   spironolactone (ALDACTONE) 25 MG tablet    Sig: Take 1 tablet (25 mg total) by mouth daily.    Dispense:  90 tablet    Refill:  3   irbesartan (AVAPRO) 150 MG tablet    Sig: Take 1 tablet (150 mg total) by mouth daily.    Dispense:  90 tablet    Refill:  3    No orders of the defined types were placed in this encounter.    Patient instructions: I will ask to re-price  out prolia.  If interested, check with pharmacy about new 2 shot shingles series (shingrix).  I think itching was due to hydroxychloroquine - this should continue to improve. Continue Cera-ve itch cream.  Restart fusion iron per GI recommendations.  Good to see you today Return as needed or in 6 months for follow up visit.   Follow up plan: Return in about 6 months (around 09/29/2021) for follow up visit.  [name], MD

## 2021-03-31 NOTE — Assessment & Plan Note (Signed)
Chronic, stable off PPI 

## 2021-03-31 NOTE — Assessment & Plan Note (Signed)
Saw oral surgeon - this continues healing well.

## 2021-03-31 NOTE — Assessment & Plan Note (Signed)
Stable period on sertraline and klonopin - continue.

## 2021-03-31 NOTE — Assessment & Plan Note (Addendum)
Acute episode over the last few months correlating to commencement of hydroxychloroquine which can exacerbate pruritis - seems to be improving now that they have stopped this medication. They will reach out to rheum to discuss alternative.

## 2021-03-31 NOTE — Assessment & Plan Note (Signed)
Chronic, stable period on irbesartan.

## 2021-03-31 NOTE — Assessment & Plan Note (Signed)
Preventative protocols reviewed and updated unless pt declined. Discussed healthy diet and lifestyle.  

## 2021-03-31 NOTE — Assessment & Plan Note (Signed)
Chronic, stable period off statin. The ASCVD Risk score (Arnett DK, et al., 2019) failed to calculate for the following reasons:   The 2019 ASCVD risk score is only valid for ages 40 to 79  

## 2021-04-01 ENCOUNTER — Telehealth: Payer: Self-pay

## 2021-04-01 DIAGNOSIS — M810 Age-related osteoporosis without current pathological fracture: Secondary | ICD-10-CM

## 2021-04-01 NOTE — Telephone Encounter (Signed)
Evening!  This patient last had prolia injection 2021, did not do this in 2022 due to cost. However she would be interested in re-pricing out in the new year to see if any more affordable.  Can we re-price out and notify pt of cost to see if she'd be interested in 2023?  Thanks!  Garlon Hatchet   Will hold until January 2023 to run for benefits for new year. Dr Darnell Level aware.

## 2021-04-06 ENCOUNTER — Encounter: Payer: Self-pay | Admitting: Gastroenterology

## 2021-04-08 ENCOUNTER — Other Ambulatory Visit: Payer: Self-pay

## 2021-04-08 ENCOUNTER — Telehealth: Payer: Self-pay

## 2021-04-08 ENCOUNTER — Encounter: Payer: Self-pay | Admitting: Gastroenterology

## 2021-04-08 ENCOUNTER — Other Ambulatory Visit: Payer: Self-pay | Admitting: Family Medicine

## 2021-04-08 DIAGNOSIS — K581 Irritable bowel syndrome with constipation: Secondary | ICD-10-CM

## 2021-04-08 NOTE — Telephone Encounter (Signed)
Appointment for tommorrow at 130

## 2021-04-09 ENCOUNTER — Ambulatory Visit (INDEPENDENT_AMBULATORY_CARE_PROVIDER_SITE_OTHER): Payer: PPO | Admitting: Gastroenterology

## 2021-04-09 ENCOUNTER — Encounter: Payer: Self-pay | Admitting: Gastroenterology

## 2021-04-09 VITALS — BP 163/85 | HR 67 | Temp 98.7°F | Ht 61.0 in | Wt 155.2 lb

## 2021-04-09 DIAGNOSIS — K625 Hemorrhage of anus and rectum: Secondary | ICD-10-CM | POA: Diagnosis not present

## 2021-04-09 DIAGNOSIS — K5909 Other constipation: Secondary | ICD-10-CM | POA: Diagnosis not present

## 2021-04-09 DIAGNOSIS — K641 Second degree hemorrhoids: Secondary | ICD-10-CM

## 2021-04-09 NOTE — Progress Notes (Signed)
Cephas Darby, MD [address]  [place]  [place], [place] [address]  Main: [phone]  Fax: [phone]    Gastroenterology Consultation  Referring Provider:     [name], MD Primary Care Physician:  [name], MD Primary Gastroenterologist:  Dr. Cephas Darby Reason for Consultation:     Left lower quadrant pain, abdominal bloating and rectal bleeding        HPI:   Brooke Beasley is a 87 y.o. female referred by Dr. [name], MD  for consultation & management of chronic intermittent left lower quadrant pain, abdominal bloating and rectal bleeding.  Patient is well-known to me for more than a year with history of recurrent C. difficile diarrhea s/p Zinplava.  She called me last week secondary to severe left lower quadrant pain associated with hard lumpy bowel movements as well as intermittent rectal bleeding.  He reported that she was severely constipated.  I have advised her to try magnesium citrate which she did and started having bowel movements, all associated with incomplete emptying.  She is also experiencing bright red blood per rectum, reports drops of blood dripping into the toilet bowel.  She does report severe lower abdominal bloating that sometimes it becomes harder towards end of the day causing discomfort.  Patient is worried about rectal bleeding than anything today  She underwent colonoscopy in May 21 which revealed sigmoid colon diverticulosis, small subcentimeter polyps which are benign as well as large internal/external hemorrhoids.  She is also evaluated by colorectal surgery for rectal prolapse, decision was made to hold off on any surgical intervention at this time  Follow-up visit 04/09/2021 Patient's daughter messaged me on my chart yesterday that Brooke Beasley has been experiencing episodes of bright red blood per rectum.  She had 3 episodes yesterday and 2 today, not associated with clots, she had noticed drops of blood in  the toilet bowl.  Patient does not describe the bleeding episodes as severe.  She also felt nauseous and her gait was unsteady.  She reports that she has been having softer 2-3 bowel movements daily.  She has been taking MiraLAX, Benefiber, Colace daily and Dulcolax as needed. Patient was admitted at [facility] secondary to acute lower GI bleed, her hemoglobin dropped from 12.9 baseline to 8.3, further dropped to 7.4.  She received 1 unit of PRBCs.  She underwent CT angio which did not reveal any bleeding source.  Her acute lower GI bleed was thought to be either hemorrhoidal or diverticular exacerbated by recent constipation and large-volume laxatives.  She was having rectal bleeding for 1 week before she presented to the hospital and she had a syncopal episode which resulted in right orbital fracture.  She received 5 days of Augmentin.  She underwent flexible sigmoidoscopy which did not reveal active bleeding other than post banding healed ulcer and diverticulosis.  Patient's hemoglobin was 14 on 12/20 improved from 10.3 since 02/10/2021 Patient developed generalized itching on fusion plus, her daughter switched to slow release over-the-counter iron supplement  NSAIDs: None  Antiplts/Anticoagulants/Anti thrombotics: None  GI Procedures:  Colonoscopy 09/10/2019 - Hemorrhoids found on perianal exam. - Diverticulosis in the sigmoid colon. - Two 5 to 6 mm polyps in the ascending colon and in the cecum, removed with a cold snare. Resected and retrieved. - Rectal prolapse. - Non-bleeding external hemorrhoids. DIAGNOSIS:  A. COLON POLYP X2, CECUM; COLD SNARE:  - SESSILE SERRATED POLYP WITH DYSPLASIA (1 FRAGMENT).  - SESSILE SERRATED POLYP (  3 FRAGMENTS).  - TUBULAR ADENOMA (2 FRAGMENTS).  - NEGATIVE FOR HIGH-GRADE DYSPLASIA AND MALIGNANCY.   Past Medical History:  Diagnosis Date   Anxiety    C. difficile diarrhea 07/11/2018   S/p multiple oral vanc treatments (course and tapers) and  completed Zinplava monoclonal Ab treatment (05/10/2019) Fecal transplant on hold during COVID pandemic   CAP (community acquired pneumonia) 12/22/2017   Carotid stenosis 10/18/2014   R 1-39%, L 40-59%, rpt 1 yr (09/2014)    CKD (chronic kidney disease) stage 3, GFR 30-59 ml/min (HCC) 08/02/2014   Clostridioides difficile infection    hx 2020   COVID-19 virus infection 10/13/2020   Degenerative disc disease    LS   Depression    nervous breakdonw in 1964-out of work for a year   Diverticulosis    severe by colonoscopy   Family history of adverse reaction to anesthesia    n/v   GERD (gastroesophageal reflux disease)    Glaucoma    Heart murmur 08/2011   mitral regurge - on echo    History of hiatal hernia    History of shingles    HTN (hypertension)    Hyperlipidemia    Hypertension    Hypothyroidism    IBS 11/02/2006   Intestinal bacterial overgrowth    In small colon   Lower GI bleed 12/2020   thought diverticular complicated by ABLA with syncope and orbital fracture s/p hospitalization   Maxillary fracture ([place]) 04/08/2012   Osteoarthritis 2016   ([facility])   Osteoporosis    dexa 2011   Pseudogout 2016   shoulders ([place])   Pseudogout     Past Surgical History:  Procedure Laterality Date   barium enema  2012   severe diverticulosis, redundant colon   Bowel obstruction  1999   no surgery in hosp x 3 days   COLONOSCOPY  1. 1999  2. 11/04   1. Not finished  2. Slight hemorrhage rectosigmoid area, severe sig diverticulosis   COLONOSCOPY WITH PROPOFOL N/A 09/10/2019   SSP with dysplasia, TA, rpt 3 yrs Marius Ditch, Tally Due, MD)   Dexa  1. [phone]   2. 9/04  3. 3/08    1. OP  2. OP, borderline, spine -2.44T  3. decreased BMD-OP   DEXA  2011   OP in spine   DG KNEE 1-2 VIEWS BILAT     LS x-ray with degenerative disc and facet change   ESOPHAGOGASTRODUODENOSCOPY     Negative   FLEXIBLE SIGMOIDOSCOPY N/A 01/16/2021   Procedure: FLEXIBLE SIGMOIDOSCOPY;  Surgeon:  Gatha Mayer, MD;  Location: [facility];  Service: Endoscopy;  Laterality: N/A;  or unsedated   Hemorrhoid procedure  07/2006   JOINT REPLACEMENT     TONSILLECTOMY     TOTAL SHOULDER ARTHROPLASTY Left 11/27/2015   Corky Mull, MD   TOTAL SHOULDER REVISION Left 03/16/2016   Procedure: TOTAL SHOULDER REVISION;  Surgeon: Corky Mull, MD;  Location: [facility];  Service: Orthopedics;  Laterality: Left;   US ECHOCARDIOGRAPHY  [id number]/2355   Normal systolic fxn with EF 73-22%.  Focal basal septal hypertrophy.  Mild diastolic dysfunction.  Mild MR.    Current Outpatient Medications:    acetaminophen (TYLENOL) 500 MG tablet, Take 500-1,000 mg by mouth every 6 (six) hours as needed for moderate pain. Depends on pain, Disp: , Rfl:    albuterol (PROVENTIL HFA;VENTOLIN HFA) 108 (90 Base) MCG/ACT inhaler, Inhale 2 puffs into the lungs every 6 (six) hours as needed for wheezing  or shortness of breath., Disp: 1 Inhaler, Rfl: 0   ALPHAGAN P 0.1 % SOLN, , Disp: , Rfl:    bimatoprost (LUMIGAN) 0.01 % SOLN, Place 1 drop into both eyes at bedtime., Disp: , Rfl:    Bioflavonoid Products (VITAMIN C) CHEW, Chew 1 tablet by mouth daily., Disp: , Rfl:    bisacodyl (DULCOLAX) 5 MG EC tablet, Take 1 tablet (5 mg total) by mouth daily as needed for moderate constipation., Disp: 30 tablet, Rfl: 1   brimonidine (ALPHAGAN) 0.15 % ophthalmic solution, 1 drop 2 (two) times daily., Disp: , Rfl:    cholecalciferol (VITAMIN D) 1000 units tablet, Take 1,000 Units by mouth daily., Disp: , Rfl:    clonazePAM (KLONOPIN) 0.5 MG tablet, TAKE 1 TABLET BY MOUTH AT BEDTIME, Disp: 30 tablet, Rfl: 0   colchicine 0.6 MG tablet, Take 1 tablet (0.6 mg total) by mouth daily as needed (gout flare)., Disp: 90 tablet, Rfl: 0   denosumab (PROLIA) 60 MG/ML SOSY injection, Inject 60 mg into the skin every 6 (six) months., Disp: 1 mL, Rfl: 1   docusate sodium (COLACE) 100 MG capsule, Take 1 capsule (100 mg total) by mouth 2 (two) times daily., Disp:  60 capsule, Rfl: 0   Famotidine-Ca Carb-Mag Hydrox (PEPCID COMPLETE PO), Take 1 tablet by mouth daily as needed (indigestion)., Disp: , Rfl:    furosemide (LASIX) 20 MG tablet, Take 20 mg by mouth as needed., Disp: , Rfl:    hydrocortisone 2.5 % cream, Place 1 application rectally daily as needed (hemorrhoids)., Disp: , Rfl:    Inulin (FIBER CHOICE PO), Take 1 tablet by mouth daily., Disp: , Rfl:    irbesartan (AVAPRO) 150 MG tablet, Take 1 tablet (150 mg total) by mouth daily., Disp: 90 tablet, Rfl: 3   levothyroxine (SYNTHROID) 100 MCG tablet, Take 1 tablet by mouth once daily, Disp: 90 tablet, Rfl: 1   Misc Natural Products (OSTEO BI-FLEX TRIPLE STRENGTH PO), Take 1 tablet by mouth daily. With Vit D, Disp: , Rfl:    Multiple Vitamin (MULTIVITAMIN ADULT PO), Take by mouth daily., Disp: , Rfl:    ondansetron (ZOFRAN) 4 MG tablet, Take 1 tablet (4 mg total) by mouth every 8 (eight) hours as needed for nausea or vomiting., Disp: 20 tablet, Rfl: 0   polyethylene glycol (MIRALAX / GLYCOLAX) 17 g packet, Take 17 g by mouth daily., Disp: , Rfl:    promethazine-phenylephrine (PROMETHAZINE VC) 6.25-5 MG/5ML SYRP, Take 5 mLs by mouth every 4 (four) hours as needed for congestion., Disp: 118 mL, Rfl: 0   sertraline (ZOLOFT) 100 MG tablet, Take 100 mg by mouth in the morning., Disp: , Rfl:    Simethicone 180 MG CAPS, Take 1 capsule by mouth daily as needed (indigestion)., Disp: , Rfl:    spironolactone (ALDACTONE) 25 MG tablet, Take 1 tablet (25 mg total) by mouth daily., Disp: 90 tablet, Rfl: 3   traMADol (ULTRAM) 50 MG tablet, Take by mouth as needed., Disp: , Rfl:    VOLTAREN 1 % GEL, APPLY ONE APPLICATION TOPICALLY 3 TIMES DAILY, Disp: 100 g, Rfl: 3   Wheat Dextrin (BENEFIBER DRINK MIX PO), Take 15 mLs by mouth daily., Disp: , Rfl:    Family History  Problem Relation Age of Onset   Diabetes Brother        post-op   Coronary artery disease Brother    Diabetes Paternal Grandfather    Breast cancer  Cousin      Social History  Tobacco Use   Smoking status: Never   Smokeless tobacco: Never  Vaping Use   Vaping Use: Never used  Substance Use Topics   Alcohol use: No   Drug use: No    Allergies as of 04/09/2021 - Review Complete 04/09/2021  Allergen Reaction Noted   Amlodipine Other (See Comments) 05/16/2020   Prednisone Other (See Comments) 11/12/2015   Ace inhibitors Other (See Comments) 04/02/2008   Alendronate sodium  09/14/2006   Carvedilol Other (See Comments) 11/06/2020   Losartan Other (See Comments) 09/26/2014   Other Other (See Comments) 11/12/2015   Paroxetine hcl Other (See Comments) 06/15/2011   Risedronate sodium  03/13/2007   Silenor [doxepin hcl] Other (See Comments) 04/02/2013   Simvastatin  10/03/2009   Sulfonamide derivatives  06/08/2006   Toprol xl [metoprolol] Diarrhea 08/03/2019   Tramadol Other (See Comments) 11/12/2015   Influenza vaccines Rash and Other (See Comments) 05/24/2014    Review of Systems:    All systems reviewed and negative except where noted in HPI.   Physical Exam:  BP (!) 163/85 (BP Location: Left Arm, Patient Position: Sitting, Cuff Size: Normal)    Pulse 67    Temp 98.7 F (37.1 C) (Temporal)    Ht 5\' 1"  (1.549 m)    Wt 155 lb 3.2 oz (70.4 kg)    BMI 29.32 kg/m  No LMP recorded. Patient is postmenopausal.  General:   Alert,  Well-developed, well-nourished, pleasant and cooperative in NAD Head:  Normocephalic and atraumatic. Eyes:  Sclera clear, no icterus.   Conjunctiva pink. Ears:  Normal auditory acuity. Nose:  No deformity, discharge, or lesions. Mouth:  No deformity or lesions,oropharynx pink & moist. Neck:  Supple; no masses or thyromegaly. Lungs:  Respirations even and unlabored.  Clear throughout to auscultation.   No wheezes, crackles, or rhonchi. No acute distress. Heart:  Regular rate and rhythm; no murmurs, clicks, rubs, or gallops. Abdomen:  Normal bowel sounds. Soft, non-tender and non-distended without  masses, hepatosplenomegaly or hernias noted.  No guarding or rebound tenderness.   Rectal: Nontender digital rectal exam, normal perianal skin, anoscopy revealed 2 areas of stigmata of recent bleeding from the right anterior and right posterior hemorrhoids Msk:  Symmetrical without gross deformities. Good, equal movement & strength bilaterally. Pulses:  Normal pulses noted. Extremities:  No clubbing or edema.  No cyanosis. Neurologic:  Alert and oriented x3;  grossly normal neurologically. Skin:  Intact without significant lesions or rashes. No jaundice. Psych:  Alert and cooperative. Normal mood and affect.  Imaging Studies: Reviewed  Assessment and Plan:   Brooke Beasley is a 87 y.o. female with history of chronic constipation, sigmoid diverticulosis, chronic abdominal bloating, antibiotic associated C. difficile diarrhea in 05/2018 s/p 10days of oral vancomycin, 1st recurrence in 06/2018, treated with 2 weeks of Dificid with prolonged taper.  Second recurrence in 09/2018, treated with long-term oral vancomycin.  C. difficile toxin returned positive on 11/14/2018 while on oral vancomycin, s/p Zinplava in 04/2019 with resolution of diarrhea. Patient is not on any antibiotics at this time.  Patient has been experiencing constipation since resolution of C. difficile for which she is taking stool softener, fiber supplements and laxatives as needed.  She has intermittent rectal bleeding that resulted in anemia, underwent hemorrhoid ligation x3 in 2022.  Acute lower GI bleed in 12/2020 requiring hospitalization and blood transfusion, thought to be secondary to diverticular source.   Patient made an urgent visit to see me today due to recurrence of bright red  blood per rectum.  She also reports mild lower abdominal discomfort and ongoing abdominal bloating.  She said that Augmentin helped with her abdominal discomfort and bloating when she was given for orbital fracture and she is requesting the same  antibiotic today  Bright red blood per rectum Anoscopy revealed brown stool in the rectal vault and stigmata of recent bleeding of the right anterior and right posterior hemorrhoids which is likely the source of bleeding Discussed about hemorrhoid ligation today, consent obtained Perform ligation of right posterior hemorrhoid Check CBC today, last hemoglobin 14 on 12/2 Discussed with patient and her daughter that if she continues to have rectal bleeding, neck step would be to call embolization by vascular surgery or hemorrhoidectomy.  Patient's daughter will call me if she has recurrence of rectal bleeding  Chronic constipation Patient did not tolerate Linzess in the past, resulted in nausea and abdominal discomfort Patient is currently having 2-3 soft bowel movements daily, advised her to avoid using Dulcolax daily She will continue MiraLAX and fiber daily  Abdominal bloating and lower abdominal discomfort Deferred antibiotic today   Follow up as needed and contact via MyChart   Cephas Darby, MD

## 2021-04-09 NOTE — Progress Notes (Signed)
PROCEDURE NOTE: The patient presents with symptomatic grade 2 hemorrhoids, unresponsive to maximal medical therapy, requesting rubber band ligation of his/her hemorrhoidal disease.  All risks, benefits and alternative forms of therapy were described and informed consent was obtained.  In the Left Lateral Decubitus position (if anoscopy is performed) anoscopic examination revealed grade 2 hemorrhoids in the all position(s).   The decision was made to band the RP internal hemorrhoid, and the [facility]Regan System was used to perform band ligation without complication.  Digital anorectal examination was then performed to assure proper positioning of the band, and to adjust the banded tissue as required.  The patient was discharged home without pain or other issues.  Dietary and behavioral recommendations were given and (if necessary - prescriptions were given), along with follow-up instructions.  The patient will return  as needed for follow-up and possible additional banding as required.  No complications were encountered and the patient tolerated the procedure well.   

## 2021-04-10 ENCOUNTER — Encounter: Payer: Self-pay | Admitting: Gastroenterology

## 2021-04-10 ENCOUNTER — Other Ambulatory Visit: Payer: Self-pay | Admitting: Gastroenterology

## 2021-04-10 DIAGNOSIS — K625 Hemorrhage of anus and rectum: Secondary | ICD-10-CM

## 2021-04-10 DIAGNOSIS — K641 Second degree hemorrhoids: Secondary | ICD-10-CM

## 2021-04-10 LAB — CBC
Hematocrit: 42 % (ref 34.0–46.6)
Hemoglobin: 13.7 g/dL (ref 11.1–15.9)
MCH: 28.7 pg (ref 26.6–33.0)
MCHC: 32.6 g/dL (ref 31.5–35.7)
MCV: 88 fL (ref 79–97)
Platelets: 259 10*3/uL (ref 150–450)
RBC: 4.78 x10E6/uL (ref 3.77–5.28)
RDW: 16.2 % — ABNORMAL HIGH (ref 11.7–15.4)
WBC: 7.2 10*3/uL (ref 3.4–10.8)

## 2021-04-10 MED ORDER — HYDROCORTISONE ACETATE 25 MG RE SUPP: 25.0000 mg | Freq: Two times a day (BID) | RECTAL | 0 refills | Status: DC

## 2021-04-12 ENCOUNTER — Encounter: Payer: Self-pay | Admitting: Family Medicine

## 2021-04-16 DIAGNOSIS — H5711 Ocular pain, right eye: Secondary | ICD-10-CM | POA: Diagnosis not present

## 2021-04-16 DIAGNOSIS — H04123 Dry eye syndrome of bilateral lacrimal glands: Secondary | ICD-10-CM | POA: Diagnosis not present

## 2021-04-24 ENCOUNTER — Encounter: Payer: Self-pay | Admitting: Family Medicine

## 2021-04-25 ENCOUNTER — Other Ambulatory Visit: Payer: Self-pay | Admitting: Family Medicine

## 2021-04-27 NOTE — Telephone Encounter (Signed)
ERx 

## 2021-05-04 NOTE — Telephone Encounter (Signed)
Submitted benefits verification, waiting

## 2021-05-06 ENCOUNTER — Ambulatory Visit (INDEPENDENT_AMBULATORY_CARE_PROVIDER_SITE_OTHER): Payer: PPO | Admitting: Family Medicine

## 2021-05-06 ENCOUNTER — Encounter: Payer: Self-pay | Admitting: Family Medicine

## 2021-05-06 ENCOUNTER — Ambulatory Visit (INDEPENDENT_AMBULATORY_CARE_PROVIDER_SITE_OTHER)
Admission: RE | Admit: 2021-05-06 | Discharge: 2021-05-06 | Disposition: A | Discharge status: Home / Self Care | Payer: PPO | Source: Ambulatory Visit | Attending: Family Medicine | Admitting: Family Medicine

## 2021-05-06 ENCOUNTER — Other Ambulatory Visit: Payer: Self-pay

## 2021-05-06 VITALS — BP 158/82 | HR 62 | Temp 97.5°F | Ht 61.0 in | Wt 155.4 lb

## 2021-05-06 DIAGNOSIS — K649 Unspecified hemorrhoids: Secondary | ICD-10-CM

## 2021-05-06 DIAGNOSIS — F418 Other specified anxiety disorders: Secondary | ICD-10-CM

## 2021-05-06 DIAGNOSIS — J439 Emphysema, unspecified: Secondary | ICD-10-CM | POA: Diagnosis not present

## 2021-05-06 DIAGNOSIS — D508 Other iron deficiency anemias: Secondary | ICD-10-CM

## 2021-05-06 DIAGNOSIS — L299 Pruritus, unspecified: Secondary | ICD-10-CM

## 2021-05-06 DIAGNOSIS — I10 Essential (primary) hypertension: Secondary | ICD-10-CM | POA: Diagnosis not present

## 2021-05-06 DIAGNOSIS — I517 Cardiomegaly: Secondary | ICD-10-CM | POA: Diagnosis not present

## 2021-05-06 DIAGNOSIS — I700 Atherosclerosis of aorta: Secondary | ICD-10-CM | POA: Diagnosis not present

## 2021-05-06 MED ORDER — HYDROXYZINE HCL 25 MG PO TABS: 25.0000 mg | ORAL_TABLET | Freq: Two times a day (BID) | ORAL | 0 refills | Status: DC | PRN

## 2021-05-06 NOTE — Assessment & Plan Note (Signed)
Deteriorated mood due to pruritis.  I did recommend trial lower zoloft dose to see effect on itching.  In interim, continue klonopin nightly, start hydroxyzine PRN itch/anxiety.

## 2021-05-06 NOTE — Assessment & Plan Note (Signed)
Overall reassuring evaluation for secondary cause.  Pt/daughter feel issue with itching started after blood transfusion 12/2020. No improvement off plaquenil, off fusion plus capsules. Will trial lower sertraline dose.  Rx hydroxyzine 12.5-25mg  BID PRN itch, reviewed sedation side effect and anticholinergic side effects to watch for.  Consider doxepin, mirtazapine, gabapentin, naltrexone.

## 2021-05-06 NOTE — Assessment & Plan Note (Signed)
Ongoing issue s/p banding by GI, ongoing bleed. Next step is gen surg eval - they will call to schedule.

## 2021-05-06 NOTE — Assessment & Plan Note (Signed)
H/o this s/p blood transfusion 12/2020.  Fusion plus iron capsules were held initially concerned these had worsened pruritis however pruritis continues even off these.  Advised restart fusion plus.

## 2021-05-06 NOTE — Patient Instructions (Addendum)
Get back on full dose irbesartan.  Try half dose Zoloft for a few weeks to see if any improvement in itching.  May finish prednisone taper for now.  Start allegra allergy pill daily.  May try hydroxyzine 25mg  twice daily as needed for itch.  Continue regular moisturizing cream daily, limit hot showers.  Retry fusion plus iron.   Pruritus Pruritus is an itchy feeling on the skin. One of the most common causes is dry skin, but many different things can cause itching. Most cases of itching do not require medical attention. Sometimes itchy skin can turn into a rash. Follow these instructions at home: Skin care  Apply moisturizing lotion to your skin as needed. Lotion that contains petroleum jelly is best. Take medicines or apply medicated creams only as told by your health care provider. This may include: Corticosteroid cream. Anti-itch lotions. Oral antihistamines. Apply a cool, wet cloth (cool compress) to the affected areas. Take baths with one of the following: Epsom salts. You can get these at your local pharmacy or grocery store. Follow the instructions on the packaging. Baking soda. Pour a small amount into the bath as told by your health care provider. Colloidal oatmeal. You can get this at your local pharmacy or grocery store. Follow the instructions on the packaging. Apply baking soda paste to your skin. To make the paste, stir water into a small amount of baking soda until it reaches a paste-like consistency. Do not scratch your skin. Do not take hot showers or baths, which can make itching worse. A cool shower may help with itching as long as you apply moisturizing lotion after the shower. Do not use scented soaps, detergents, perfumes, and cosmetic products. Instead, use gentle, unscented versions of these items. General instructions Avoid wearing tight clothes. Keep a journal to help find out what is causing your itching. Write down: What you eat and drink. What cosmetic  products you use. What soaps or detergents you use. What you wear, including jewelry. Use a humidifier. This keeps the air moist, which helps to prevent dry skin. Be aware of any changes in your itchiness.  Contact a health care provider if: The itching does not go away after several days. You are unusually thirsty or urinating more than normal. Your skin tingles or feels numb. Your skin or the white parts of your eyes turn yellow (jaundice). You feel weak. You have any of the following: Night sweats. Tiredness (fatigue). Weight loss. Abdominal pain. Summary Pruritus is an itchy feeling on the skin. One of the most common causes is dry skin, but many different conditions and factors can cause itching. Apply moisturizing lotion to your skin as needed. Lotion that contains petroleum jelly is best. Take medicines or apply medicated creams only as told by your health care provider. Do not take hot showers or baths. Do not use scented soaps, detergents, perfumes, or cosmetic products. This information is not intended to replace advice given to you by your health care provider. Make sure you discuss any questions you have with your health care provider. Document Revised: 04/26/2017 Document Reviewed: 04/26/2017 Elsevier Patient Education  2022 [employer].

## 2021-05-06 NOTE — Progress Notes (Signed)
Patient ID: Brooke Beasley, female    DOB: 1930-07-21, 88 y.o.   MRN: [id number]  This visit was conducted in person.  BP (!) 158/82 (BP Location: Right Arm, Cuff Size: Normal)    Pulse 62    Temp (!) 97.5 F (36.4 C) (Temporal)    Ht 5\' 1"  (1.549 m)    Wt 155 lb 7 oz (70.5 kg)    SpO2 98%    BMI 29.37 kg/m   180/90 on repeat testing  CC: persistent itching  Subjective:   HPI: Brooke Beasley is a 88 y.o. female presenting on 05/06/2021 for Allergic Reaction (C/o itching allover.  Has been going on since blood transfusion, worse this weekend.  Tried old prednisone 5 mg rx, helpful. Pt accompanied by daughter, [name]. ) and Hemorrhoids (C/o bleeding hemorrhoids. )   See recent office visit and mychart messages.  Ongoing pruritis ~since blood transfusion 12/2020, also initially attributed to plaquenil however this has continued even off the medication.  Itch that rashes.  More recently we trialed lower dose irbesartan and sertraline to see if any improvement in itching -  She also tried changing undergarments and removing eggs from diet - no benefit.  Itching was worse while on fusion plus vitamin.  Uses tramadol sparingly - this could worsen itching.  She started old prednisone taper with benefit.  Nerves are shot due to itching.   No new fevers/chills, skin changes, joint pains.  Notes paresthesias to fingers.   She is using aveeno and cerave daily, she's used benadryl, sarna, calomine lotion. Has used OTC cortizone-10.   Ongoing hemorrhoidal bleeding followed by GI with latest plans to see general surgery for further evaluation.latest hemorrhoid ligation done 04/09/2021.   BP elevated since dropping irbesartan dose - bp log she brings today was reviewed.      Relevant past medical, surgical, family and social history reviewed and updated as indicated. Interim medical history since our last visit reviewed. Allergies and medications reviewed and  updated. Outpatient Medications Prior to Visit  Medication Sig Dispense Refill   acetaminophen (TYLENOL) 500 MG tablet Take 500-1,000 mg by mouth every 6 (six) hours as needed for moderate pain. Depends on pain     albuterol (PROVENTIL HFA;VENTOLIN HFA) 108 (90 Base) MCG/ACT inhaler Inhale 2 puffs into the lungs every 6 (six) hours as needed for wheezing or shortness of breath. 1 Inhaler 0   ALPHAGAN P 0.1 % SOLN      bimatoprost (LUMIGAN) 0.01 % SOLN Place 1 drop into both eyes at bedtime.     Bioflavonoid Products (VITAMIN C) CHEW Chew 1 tablet by mouth daily.     bisacodyl (DULCOLAX) 5 MG EC tablet Take 1 tablet (5 mg total) by mouth daily as needed for moderate constipation. 30 tablet 1   brimonidine (ALPHAGAN) 0.15 % ophthalmic solution 1 drop 2 (two) times daily.     cholecalciferol (VITAMIN D) 1000 units tablet Take 1,000 Units by mouth daily.     clonazePAM (KLONOPIN) 0.5 MG tablet TAKE 1 TABLET BY MOUTH AT BEDTIME 30 tablet 0   colchicine 0.6 MG tablet Take 1 tablet (0.6 mg total) by mouth daily as needed (gout flare). 90 tablet 0   denosumab (PROLIA) 60 MG/ML SOSY injection Inject 60 mg into the skin every 6 (six) months. 1 mL 1   docusate sodium (COLACE) 100 MG capsule Take 1 capsule (100 mg total) by mouth 2 (two) times daily. 60 capsule 0   Famotidine-Ca Carb-Mag  Hydrox (PEPCID COMPLETE PO) Take 1 tablet by mouth daily as needed (indigestion).     furosemide (LASIX) 20 MG tablet Take 20 mg by mouth as needed.     hydrocortisone (ANUSOL-HC) 25 MG suppository Place 1 suppository (25 mg total) rectally 2 (two) times daily. 12 suppository 0   hydrocortisone 2.5 % cream Place 1 application rectally daily as needed (hemorrhoids).     Inulin (FIBER CHOICE PO) Take 1 tablet by mouth daily.     irbesartan (AVAPRO) 150 MG tablet Take 1 tablet (150 mg total) by mouth daily. 90 tablet 3   Iron-FA-B Cmp-C-Biot-Probiotic (FUSION PLUS) CAPS Take 1 capsule by mouth daily.     levothyroxine  (SYNTHROID) 100 MCG tablet Take 1 tablet by mouth once daily 90 tablet 1   Misc Natural Products (OSTEO BI-FLEX TRIPLE STRENGTH PO) Take 1 tablet by mouth daily. With Vit D     Multiple Vitamin (MULTIVITAMIN ADULT PO) Take by mouth daily.     ondansetron (ZOFRAN) 4 MG tablet Take 1 tablet (4 mg total) by mouth every 8 (eight) hours as needed for nausea or vomiting. 20 tablet 0   polyethylene glycol (MIRALAX / GLYCOLAX) 17 g packet Take 17 g by mouth daily.     sertraline (ZOLOFT) 100 MG tablet Take 100 mg by mouth in the morning.     Simethicone 180 MG CAPS Take 1 capsule by mouth daily as needed (indigestion).     spironolactone (ALDACTONE) 25 MG tablet Take 1 tablet (25 mg total) by mouth daily. 90 tablet 3   traMADol (ULTRAM) 50 MG tablet Take by mouth as needed.     VOLTAREN 1 % GEL APPLY ONE APPLICATION TOPICALLY 3 TIMES DAILY 100 g 3   Wheat Dextrin (BENEFIBER DRINK MIX PO) Take 15 mLs by mouth daily.     promethazine-phenylephrine (PROMETHAZINE VC) 6.25-5 MG/5ML SYRP Take 5 mLs by mouth every 4 (four) hours as needed for congestion. 118 mL 0   No facility-administered medications prior to visit.     Per HPI unless specifically indicated in ROS section below Review of Systems  Objective:  BP (!) 158/82 (BP Location: Right Arm, Cuff Size: Normal)    Pulse 62    Temp (!) 97.5 F (36.4 C) (Temporal)    Ht 5\' 1"  (1.549 m)    Wt 155 lb 7 oz (70.5 kg)    SpO2 98%    BMI 29.37 kg/m   Wt Readings from Last 3 Encounters:  05/06/21 155 lb 7 oz (70.5 kg)  04/09/21 155 lb 3.2 oz (70.4 kg)  03/31/21 155 lb 7 oz (70.5 kg)      Physical Exam Vitals and nursing note reviewed.  Constitutional:      Appearance: Normal appearance. She is not ill-appearing.  Cardiovascular:     Rate and Rhythm: Normal rate and regular rhythm.     Heart sounds: Murmur (3/6 systolic) heard.  Pulmonary:     Effort: Pulmonary effort is normal. No respiratory distress.     Breath sounds: Normal breath sounds. No  wheezing, rhonchi or rales.  Musculoskeletal:     Right lower leg: No edema.     Left lower leg: No edema.  Skin:    General: Skin is warm and dry.     Findings: No erythema, lesion or rash.     Comments: Some excoriations to L forearm  Neurological:     Mental Status: She is alert.  Psychiatric:  Mood and Affect: Mood normal.        Behavior: Behavior normal.      Results for orders placed or performed in visit on 04/09/21  CBC  Result Value Ref Range   WBC 7.2 3.4 - 10.8 x10E3/uL   RBC 4.78 3.77 - 5.28 x10E6/uL   Hemoglobin 13.7 11.1 - 15.9 g/dL   Hematocrit 42.0 34.0 - 46.6 %   MCV 88 79 - 97 fL   MCH 28.7 26.6 - 33.0 pg   MCHC 32.6 31.5 - 35.7 g/dL   RDW 16.2 (H) 11.7 - 15.4 %   Platelets 259 150 - 450 x10E3/uL    Assessment & Plan:  This visit occurred during the SARS-CoV-2 public health emergency.  Safety protocols were in place, including screening questions prior to the visit, additional usage of staff PPE, and extensive cleaning of exam room while observing appropriate contact time as indicated for disinfecting solutions.   Problem List Items Addressed This Visit     Hypertension, essential    Chronic, deteriorated on recent lower dose irbesartan - will go home and take extra 1/2 pill today then restart full dose irbesartan, continue to monitor BP readings and let me know if consistently elevated.       Bleeding hemorrhoid    Ongoing issue s/p banding by GI, ongoing bleed. Next step is gen surg eval - they will call to schedule.       Anxiety associated with depression    Deteriorated mood due to pruritis.  I did recommend trial lower zoloft dose to see effect on itching.  In interim, continue klonopin nightly, start hydroxyzine PRN itch/anxiety.       Relevant Medications   hydrOXYzine (ATARAX) 25 MG tablet   Iron deficiency anemia    H/o this s/p blood transfusion 12/2020.  Fusion plus iron capsules were held initially concerned these had worsened  pruritis however pruritis continues even off these.  Advised restart fusion plus.       Relevant Medications   Iron-FA-B Cmp-C-Biot-Probiotic (FUSION PLUS) CAPS   Pruritus - Primary    Overall reassuring evaluation for secondary cause.  Pt/daughter feel issue with itching started after blood transfusion 12/2020. No improvement off plaquenil, off fusion plus capsules. Will trial lower sertraline dose.  Rx hydroxyzine 12.5-25mg  BID PRN itch, reviewed sedation side effect and anticholinergic side effects to watch for.  Consider doxepin, mirtazapine, gabapentin, naltrexone.       Relevant Orders   DG Chest 2 View     Meds ordered this encounter  Medications   hydrOXYzine (ATARAX) 25 MG tablet    Sig: Take 1 tablet (25 mg total) by mouth 2 (two) times daily as needed for itching.    Dispense:  40 tablet    Refill:  0   Orders Placed This Encounter  Procedures   DG Chest 2 View    Standing Status:   Future    Number of Occurrences:   1    Standing Expiration Date:   05/06/2022    Order Specific Question:   Reason for Exam (SYMPTOM  OR DIAGNOSIS REQUIRED)    Answer:   pruritis    Order Specific Question:   Preferred imaging location?    Answer:   [facility]    Patient instructions: Get back on full dose irbesartan.  Try half dose Zoloft for a few weeks to see if any improvement in itching.  May finish prednisone taper for now.  Start allegra allergy pill  daily.  May try hydroxyzine 25mg  twice daily as needed for itch.  Continue regular moisturizing cream daily, limit hot showers.  Retry fusion plus iron.   Follow up plan: No follow-ups on file.  [name], MD

## 2021-05-06 NOTE — Telephone Encounter (Signed)
Seen today. 

## 2021-05-06 NOTE — Assessment & Plan Note (Signed)
Chronic, deteriorated on recent lower dose irbesartan - will go home and take extra 1/2 pill today then restart full dose irbesartan, continue to monitor BP readings and let me know if consistently elevated.

## 2021-05-10 ENCOUNTER — Encounter: Payer: Self-pay | Admitting: Family Medicine

## 2021-05-10 NOTE — Telephone Encounter (Signed)
Called spoke to daughter advised that she call office now to talk to access nurse. Recommended taking patient to get evaluation now for treatment. She declined states that the rectal bleeding is "sporadic and they are waiting on surgeon to call to set up surgery date." She has agreed to call and talk with access nurse and will do so now.

## 2021-05-11 NOTE — Telephone Encounter (Signed)
I spoke with [name] (DPR signed)  and she said pt saw blood with BM and passed out on 05/10/21 at 2 AM. Pt did not have any BM after that episode and has not seen any bleeding or had any abd pain. Pt does not have lower abd pain now but does have lower abd pain with BM. Pt has seen drops of blood in commode and on floor after having BM; pt has rectal prolapse and hemorrhoids. Dr Darnell Level put in referral on 05/06/21 but [name] has not  heard about appt with Dr Johney Maine yet. Offered appt at [facility] today but [name] said pt is still resting and will wait to cb if needed or if pt symptoms worsen or more abd pain or rectal bleeding [name] will take pt to ED. [name] request cb about appt with Dr Johney Maine and sending note to Dr Valinda Hoar CMA and [place] referrals. I will also teams lisa.

## 2021-05-11 NOTE — Telephone Encounter (Signed)
[place] Night - Client TELEPHONE ADVICE RECORD AccessNurse Patient Name: Brooke Beasley XNAT Gender: Female DOB: Jun 14, 1930 Age: 88 Y 48 M 18 D Return Phone Number: [phone] (Primary), [phone] (Secondary) Address: City/ State/ Zip: [place]  [address] Client [facility] Night - Client Client Site [place] Provider [name] - MD Contact Type Call Who Is Calling Patient / Member / Family / Caregiver Call Type Triage / Clinical Caller Name Marygrace Drought Relationship To Patient Daughter Return Phone Number [phone] (Primary) Chief Complaint FAINTING or [place] Reason for Call Symptomatic / Request for [place] states her daughter had passed out today. Translation No Nurse Assessment Nurse: Volanda Napoleon, RN, [name] Date/Time Eilene Ghazi Time): 05/10/2021 4:21:22 PM Confirm and document reason for call. If symptomatic, describe symptoms. ---Caller states that mother lost consciousness this morning at 2:00am because of rectal bleeding. This has happened in the past. Does the patient have any new or worsening symptoms? ---Yes Will a triage be completed? ---Yes Related visit to physician within the last 2 weeks? ---No Does the PT have any chronic conditions? (i.e. diabetes, asthma, this includes High risk factors for pregnancy, etc.) ---Yes List chronic conditions. ---hemorrhoids, HTN Is this a behavioral health or substance abuse call? ---No Guidelines Guideline Title Affirmed Question Affirmed Notes Nurse Date/Time (Eastern Time) Fainting [1] Age > 50 years AND [2] now alert and feels fine Jori Moll 05/10/2021 4:23:45 PM Disp. Time Eilene Ghazi Time) Disposition Final User 05/10/2021 4:19:31 PM Send to Urgent Zannie Kehr 05/10/2021 4:27:48 PM Paged On Call back to [facility], [name] 05/10/2021 4:26:57 PM Go to ED  Now (or PCP triage) Yes Volanda Napoleon, RN, [name] PLEASE NOTE: All timestamps contained within this report are represented as [place] Standard Time. CONFIDENTIALTY NOTICE: This fax transmission is intended only for the addressee. It contains information that is legally privileged, confidential or otherwise protected from use or disclosure. If you are not the intended recipient, you are strictly prohibited from reviewing, disclosing, copying using or disseminating any of this information or taking any action in reliance on or regarding this information. If you have received this fax in error, please notify us immediately by telephone so that we can arrange for its return to [place]. Phone: [phone], Toll-Free: [phone], Fax: [phone] Page: 2 of 2 Call Id: [id number] [place] Disagree/Comply Disagree Caller Understands Yes PreDisposition Home Care Care Advice Given Per Guideline GO TO ED NOW (OR PCP TRIAGE): * IF PCP SECOND-LEVEL TRIAGE REQUIRED: You may need to be seen. Your doctor (or NP/PA) will want to talk with you to decide what's best. I'll page the provider on-call now. If you haven't heard from the provider (or me) within 30 minutes, go directly to the [place] at [facility] Hospital. Comments User: Viviann Spare, RN Date/Time Eilene Ghazi Time): 05/10/2021 5:03:33 PM Caller made aware of what on call physcian said Referrals GO TO FACILITY REFUSED Paging DoctorName Phone DateTime Result/ Outcome Message Type Notes Eliezer Lofts - MD [phone] 05/10/2021 4:27:48 PM Paged On Call Back to Call Center Doctor Paged This is the nurse with access nurse regarding a patient. Please call me back at [phone] Eliezer Lofts - MD 05/10/2021 5:02:40 PM Spoke with On Call - Outcome Notification Message Result on call agrees with disp

## 2021-05-11 NOTE — Telephone Encounter (Signed)
This referral was not marked as urgent. Just getting to it this morning.  This was faxed to Dr Michael Boston with [facility]  Faxed to [phone]    [id number]  Virl Cagey, CMA on 05/11/2021  - delivered at 05/11/2021   The patient Daughter can call to schedule. They will usually review the referral then call to schedule. I am not sure how far behind they are on referrals so they may want to call rather than waiting.

## 2021-05-12 ENCOUNTER — Telehealth: Payer: Self-pay | Admitting: Cardiology

## 2021-05-12 ENCOUNTER — Ambulatory Visit: Payer: Self-pay | Admitting: Surgery

## 2021-05-12 ENCOUNTER — Telehealth: Payer: Self-pay | Admitting: Family Medicine

## 2021-05-12 DIAGNOSIS — H9193 Unspecified hearing loss, bilateral: Secondary | ICD-10-CM | POA: Insufficient documentation

## 2021-05-12 DIAGNOSIS — K5730 Diverticulosis of large intestine without perforation or abscess without bleeding: Secondary | ICD-10-CM | POA: Diagnosis not present

## 2021-05-12 DIAGNOSIS — D62 Acute posthemorrhagic anemia: Secondary | ICD-10-CM | POA: Diagnosis not present

## 2021-05-12 DIAGNOSIS — R739 Hyperglycemia, unspecified: Secondary | ICD-10-CM

## 2021-05-12 DIAGNOSIS — K635 Polyp of colon: Secondary | ICD-10-CM | POA: Diagnosis not present

## 2021-05-12 DIAGNOSIS — Z8619 Personal history of other infectious and parasitic diseases: Secondary | ICD-10-CM | POA: Diagnosis not present

## 2021-05-12 DIAGNOSIS — K623 Rectal prolapse: Secondary | ICD-10-CM | POA: Diagnosis not present

## 2021-05-12 DIAGNOSIS — K649 Unspecified hemorrhoids: Secondary | ICD-10-CM | POA: Diagnosis not present

## 2021-05-12 DIAGNOSIS — K5909 Other constipation: Secondary | ICD-10-CM | POA: Diagnosis not present

## 2021-05-12 DIAGNOSIS — N1831 Chronic kidney disease, stage 3a: Secondary | ICD-10-CM

## 2021-05-12 NOTE — Telephone Encounter (Signed)
° °  Pre-operative Risk Assessment    Patient Name: Brooke Beasley  DOB: 09-28-1930 MRN: [id number]      Request for Surgical Clearance    Procedure:   Rectal prolapse Surgery   Date of Surgery:  Clearance TBD                                 Surgeon:  not noted  Surgeon's Group or Practice Name:  [facility]  Phone number:  [phone] Fax number:  [phone]   Type of Clearance Requested:   Medical and pharmacy    Type of Anesthesia:  General    Additional requests/questions:    Jonathon Jordan   05/12/2021, 11:45 AM

## 2021-05-12 NOTE — Telephone Encounter (Signed)
° °  Primary Cardiologist: Kate Sable, MD  Chart reviewed as part of pre-operative protocol coverage. Given past medical history and time since last visit, based on ACC/AHA guidelines, Brooke Beasley would be at acceptable risk for the planned procedure without further cardiovascular testing.   Her RCRI is a class II risk, 0.9% risk of major cardiac event.  I will route this recommendation to the requesting party via Epic fax function and remove from pre-op pool.  Please call with questions.  Jossie Ng. Egor Fullilove NP-C    05/12/2021, 11:56 AM [place] [address] Office [phone] Fax [phone]

## 2021-05-12 NOTE — Telephone Encounter (Signed)
Patient saw Dr Johney Maine general surgery today who recommends robotic rectosigmoid resection and rectopexy.  RCRI = 0. Anticipate adequately low risk to proceed with planned surgery.   In h/o GI bleed and anemia and CKD, I do recommend checking Cr and Hgb prior to surgery. Orders placed.  Plz call to schedule lab visit at her convenience.

## 2021-05-13 NOTE — Telephone Encounter (Signed)
Pt scheduled on 05/14/21 at 3:15 at [facility].

## 2021-05-14 ENCOUNTER — Other Ambulatory Visit (INDEPENDENT_AMBULATORY_CARE_PROVIDER_SITE_OTHER): Payer: PPO

## 2021-05-14 ENCOUNTER — Other Ambulatory Visit: Payer: Self-pay

## 2021-05-14 DIAGNOSIS — N1831 Chronic kidney disease, stage 3a: Secondary | ICD-10-CM | POA: Diagnosis not present

## 2021-05-15 LAB — RENAL FUNCTION PANEL
Albumin: 3.9 g/dL (ref 3.5–5.2)
BUN: 20 mg/dL (ref 6–23)
CO2: 29 mEq/L (ref 19–32)
Calcium: 9.1 mg/dL (ref 8.4–10.5)
Chloride: 97 mEq/L (ref 96–112)
Creatinine, Ser: 1.05 mg/dL (ref 0.40–1.20)
GFR: 47.45 mL/min — ABNORMAL LOW (ref >60.00)
Glucose, Bld: 98 mg/dL (ref 70–99)
Phosphorus: 4 mg/dL (ref 2.3–4.6)
Potassium: 4.1 mEq/L (ref 3.5–5.1)
Sodium: 133 mEq/L — ABNORMAL LOW (ref 135–145)

## 2021-05-15 LAB — CBC WITH DIFFERENTIAL/PLATELET
Basophils Absolute: 0.1 10*3/uL (ref 0.0–0.1)
Basophils Relative: 0.8 % (ref 0.0–3.0)
Eosinophils Absolute: 0.3 10*3/uL (ref 0.0–0.7)
Eosinophils Relative: 4.7 % (ref 0.0–5.0)
HCT: 38.6 % (ref 36.0–46.0)
Hemoglobin: 12.6 g/dL (ref 12.0–15.0)
Lymphocytes Relative: 24.3 % (ref 12.0–46.0)
Lymphs Abs: 1.7 10*3/uL (ref 0.7–4.0)
MCHC: 32.7 g/dL (ref 30.0–36.0)
MCV: 89.9 fl (ref 78.0–100.0)
Monocytes Absolute: 0.9 10*3/uL (ref 0.1–1.0)
Monocytes Relative: 13.7 % — ABNORMAL HIGH (ref 3.0–12.0)
Neutro Abs: 3.9 10*3/uL (ref 1.4–7.7)
Neutrophils Relative %: 56.5 % (ref 43.0–77.0)
Platelets: 228 10*3/uL (ref 150.0–400.0)
RBC: 4.29 Mil/uL (ref 3.87–5.11)
RDW: 16.5 % — ABNORMAL HIGH (ref 11.5–15.5)
WBC: 6.9 10*3/uL (ref 4.0–10.5)

## 2021-05-16 NOTE — Telephone Encounter (Signed)
Recent labs overall stable.  Medically cleared for upcoming surgical intervention for symptomatic rectal prolapse.  Will forward to Dr Johney Maine.

## 2021-05-18 ENCOUNTER — Encounter (HOSPITAL_COMMUNITY): Payer: PPO | Attending: Family Medicine

## 2021-05-20 ENCOUNTER — Other Ambulatory Visit (HOSPITAL_COMMUNITY): Payer: Self-pay

## 2021-05-21 NOTE — Progress Notes (Addendum)
I called Pt to come in for her Covid test before her PAT appointment tomorrow. She will come with her daughter.

## 2021-05-21 NOTE — Patient Instructions (Addendum)
DUE TO COVID-19 ONLY ONE VISITOR IS ALLOWED TO COME WITH YOU AND STAY IN THE WAITING ROOM ONLY DURING PRE OP AND PROCEDURE DAY OF SURGERY IF YOU ARE GOING HOME AFTER SURGERY. IF YOU ARE SPENDING THE NIGHT 2 PEOPLE MAY VISIT WITH YOU IN YOUR PRIVATE ROOM AFTER SURGERY UNTIL VISITING  HOURS ARE OVER AT 8:00 PM AND 1  VISITOR  MAY  SPEND THE NIGHT.   YOU NEED TO HAVE A COVID 19 TEST ON_1/27/23______ @_______ , THIS TEST MUST BE DONE BEFORE SURGERY,               Brooke Beasley     Your procedure is scheduled on: 05/26/21   Report to [facility] Main  Entrance   Report to admitting at 10:45AM     Call this number if you have problems the morning of surgery [phone]   Follow all diet and bowel prep instructions from the Dr.'s office   Drink plenty of fluids on day of prep to prevent dehydration.  DRINK 2 PRESURGERY ENSURE DRINKS THE NIGHT BEFORE SURGERY AT 10:00 PM    NO SOLIDS AFTER MIDNIGHT THE DAY PRIOR TO THE SURGERY.   NOTHING BY MOUTH EXCEPT CLEAR LIQUIDS UNTIL : 10:00 am   PLEASE FINISH PRESURGERY ENSURE DRINK PER SURGEON ORDER 3 HOURS PRIOR TO SCHEDULED SURGERY TIME WHICH NEEDS TO BE COMPLETED AT _10:00 am________.    CLEAR LIQUID DIET   Foods Allowed                                                                     Foods Excluded  Coffee and tea, regular and decaf                             liquids that you cannot  Plain Jell-O any favor except red or purple                                           see through such as: Fruit ices (not with fruit pulp)                                     milk, soups, orange juice  Iced Popsicles                                    All solid food Carbonated beverages, regular and diet                                    Cranberry, grape and apple juices Sports drinks like Gatorade Lightly seasoned clear broth or consume(fat free) Sugar     BRUSH YOUR TEETH MORNING OF SURGERY AND RINSE YOUR MOUTH OUT, NO CHEWING  GUM CANDY OR MINTS.     Take these medicines the morning of surgery with A SIP OF WATER: Zoloft, Colchicine, Levothyroxine  You may not have any metal on your body including hair pins and              piercings  Do not wear jewelry, make-up, lotions, powders or perfumes, deodorant             Do not wear nail polish on your fingernails.  Do not shave  48 hours prior to surgery.                 Do not bring valuables to the hospital. [place].  Contacts, dentures or bridgework may not be worn into surgery.  Leave suitcase in the car. After surgery it may be brought to your room.                [facility] - Preparing for Surgery Before surgery, you can play an important role.  Because skin is not sterile, your skin needs to be as free of germs as possible.  You can reduce the number of germs on your skin by washing with CHG (chlorahexidine gluconate) soap before surgery.  CHG is an antiseptic cleaner which kills germs and bonds with the skin to continue killing germs even after washing. Please DO NOT use if you have an allergy to CHG or antibacterial soaps.  If your skin becomes reddened/irritated stop using the CHG and inform your nurse when you arrive at Short Stay. Do not shave (including legs and underarms) for at least 48 hours prior to the first CHG shower.   Please follow these instructions carefully:  1.  Shower with CHG Soap the night before surgery and the  morning of Surgery.  2.  If you choose to wash your hair, wash your hair first as usual with your  normal  shampoo.  3.  After you shampoo, rinse your hair and body thoroughly to remove the  shampoo.                            4.  Use CHG as you would any other liquid soap.  You can apply chg directly  to the skin and wash                       Gently with a scrungie or clean washcloth.  5.  Apply the CHG Soap to your body ONLY FROM THE NECK DOWN.    Do not use on face/ open                           Wound or open sores. Avoid contact with eyes, ears mouth and genitals (private parts).                       Wash face,  Genitals (private parts) with your normal soap.             6.  Wash thoroughly, paying special attention to the area where your surgery  will be performed.  7.  Thoroughly rinse your body with warm water from the neck down.  8.  DO NOT shower/wash with your normal soap after using and rinsing off  the CHG Soap.                9.  Pat yourself dry with a clean  towel.            10.  Wear clean pajamas.            11.  Place clean sheets on your bed the night of your first shower and do not  sleep with pets. Day of Surgery : Do not apply any lotions/deodorants the morning of surgery.  Please wear clean clothes to the hospital/surgery center.  FAILURE TO FOLLOW THESE INSTRUCTIONS MAY RESULT IN THE CANCELLATION OF YOUR SURGERY PATIENT SIGNATURE_________________________________  NURSE SIGNATURE__________________________________  ________________________________________________________________________   Adam Phenix  An incentive spirometer is a tool that can help keep your lungs clear and active. This tool measures how well you are filling your lungs with each breath. Taking long deep breaths may help reverse or decrease the chance of developing breathing (pulmonary) problems (especially infection) following: A long period of time when you are unable to move or be active. BEFORE THE PROCEDURE  If the spirometer includes an indicator to show your best effort, your nurse or respiratory therapist will set it to a desired goal. If possible, sit up straight or lean slightly forward. Try not to slouch. Hold the incentive spirometer in an upright position. INSTRUCTIONS FOR USE  Sit on the edge of your bed if possible, or sit up as far as you can in bed or on a chair. Hold the incentive spirometer in an upright  position. Breathe out normally. Place the mouthpiece in your mouth and seal your lips tightly around it. Breathe in slowly and as deeply as possible, raising the piston or the ball toward the top of the column. Hold your breath for 3-5 seconds or for as long as possible. Allow the piston or ball to fall to the bottom of the column. Remove the mouthpiece from your mouth and breathe out normally. Rest for a few seconds and repeat Steps 1 through 7 at least 10 times every 1-2 hours when you are awake. Take your time and take a few normal breaths between deep breaths. The spirometer may include an indicator to show your best effort. Use the indicator as a goal to work toward during each repetition. After each set of 10 deep breaths, practice coughing to be sure your lungs are clear. If you have an incision (the cut made at the time of surgery), support your incision when coughing by placing a pillow or rolled up towels firmly against it. Once you are able to get out of bed, walk around indoors and cough well. You may stop using the incentive spirometer when instructed by your caregiver.  RISKS AND COMPLICATIONS Take your time so you do not get dizzy or light-headed. If you are in pain, you may need to take or ask for pain medication before doing incentive spirometry. It is harder to take a deep breath if you are having pain. AFTER USE Rest and breathe slowly and easily. It can be helpful to keep track of a log of your progress. Your caregiver can provide you with a simple table to help with this. If you are using the spirometer at home, follow these instructions: [place] IF:  You are having difficultly using the spirometer. You have trouble using the spirometer as often as instructed. Your pain medication is not giving enough relief while using the spirometer. You develop fever of 100.5 F (38.1 C) or higher. SEEK IMMEDIATE MEDICAL CARE IF:  You cough up bloody sputum that had not been  present before. You develop fever of  102 F (38.9 C) or greater. You develop worsening pain at or near the incision site. MAKE SURE YOU:  Understand these instructions. Will watch your condition. Will get help right away if you are not doing well or get worse. Document Released: 08/23/2006 Document Revised: 07/05/2011 Document Reviewed: 10/24/2006 [facility] Patient Information 2014 [place], [place].   ________________________________________________________________________

## 2021-05-22 ENCOUNTER — Encounter (HOSPITAL_COMMUNITY): Payer: Self-pay

## 2021-05-22 ENCOUNTER — Other Ambulatory Visit: Payer: Self-pay | Admitting: Family Medicine

## 2021-05-22 ENCOUNTER — Encounter (HOSPITAL_COMMUNITY)
Admission: RE | Admit: 2021-05-22 | Discharge: 2021-05-22 | Disposition: A | Discharge status: Home / Self Care | Payer: PPO | Source: Ambulatory Visit | Attending: Surgery | Admitting: Surgery

## 2021-05-22 ENCOUNTER — Inpatient Hospital Stay (HOSPITAL_COMMUNITY): Admission: RE | Admit: 2021-05-22 | Payer: PPO | Source: Ambulatory Visit

## 2021-05-22 ENCOUNTER — Other Ambulatory Visit: Payer: Self-pay

## 2021-05-22 VITALS — HR 71 | Temp 97.8°F | Resp 20

## 2021-05-22 DIAGNOSIS — I5189 Other ill-defined heart diseases: Secondary | ICD-10-CM | POA: Insufficient documentation

## 2021-05-22 DIAGNOSIS — Z01812 Encounter for preprocedural laboratory examination: Secondary | ICD-10-CM | POA: Diagnosis present

## 2021-05-22 DIAGNOSIS — K581 Irritable bowel syndrome with constipation: Secondary | ICD-10-CM | POA: Diagnosis present

## 2021-05-22 DIAGNOSIS — I350 Nonrheumatic aortic (valve) stenosis: Secondary | ICD-10-CM | POA: Diagnosis not present

## 2021-05-22 DIAGNOSIS — N183 Chronic kidney disease, stage 3 unspecified: Secondary | ICD-10-CM | POA: Diagnosis present

## 2021-05-22 DIAGNOSIS — N1831 Chronic kidney disease, stage 3a: Secondary | ICD-10-CM | POA: Insufficient documentation

## 2021-05-22 DIAGNOSIS — Q438 Other specified congenital malformations of intestine: Secondary | ICD-10-CM | POA: Diagnosis not present

## 2021-05-22 DIAGNOSIS — G4700 Insomnia, unspecified: Secondary | ICD-10-CM | POA: Diagnosis present

## 2021-05-22 DIAGNOSIS — K219 Gastro-esophageal reflux disease without esophagitis: Secondary | ICD-10-CM | POA: Insufficient documentation

## 2021-05-22 DIAGNOSIS — Z8616 Personal history of COVID-19: Secondary | ICD-10-CM | POA: Diagnosis not present

## 2021-05-22 DIAGNOSIS — I129 Hypertensive chronic kidney disease with stage 1 through stage 4 chronic kidney disease, or unspecified chronic kidney disease: Secondary | ICD-10-CM | POA: Diagnosis present

## 2021-05-22 DIAGNOSIS — R739 Hyperglycemia, unspecified: Secondary | ICD-10-CM | POA: Diagnosis not present

## 2021-05-22 DIAGNOSIS — I959 Hypotension, unspecified: Secondary | ICD-10-CM | POA: Diagnosis not present

## 2021-05-22 DIAGNOSIS — I10 Essential (primary) hypertension: Secondary | ICD-10-CM | POA: Diagnosis not present

## 2021-05-22 DIAGNOSIS — H9193 Unspecified hearing loss, bilateral: Secondary | ICD-10-CM | POA: Diagnosis present

## 2021-05-22 DIAGNOSIS — L299 Pruritus, unspecified: Secondary | ICD-10-CM | POA: Diagnosis present

## 2021-05-22 DIAGNOSIS — M810 Age-related osteoporosis without current pathological fracture: Secondary | ICD-10-CM | POA: Diagnosis present

## 2021-05-22 DIAGNOSIS — K641 Second degree hemorrhoids: Secondary | ICD-10-CM | POA: Diagnosis not present

## 2021-05-22 DIAGNOSIS — G2581 Restless legs syndrome: Secondary | ICD-10-CM | POA: Diagnosis present

## 2021-05-22 DIAGNOSIS — E876 Hypokalemia: Secondary | ICD-10-CM | POA: Diagnosis present

## 2021-05-22 DIAGNOSIS — K648 Other hemorrhoids: Secondary | ICD-10-CM | POA: Diagnosis present

## 2021-05-22 DIAGNOSIS — F419 Anxiety disorder, unspecified: Secondary | ICD-10-CM | POA: Diagnosis present

## 2021-05-22 DIAGNOSIS — H409 Unspecified glaucoma: Secondary | ICD-10-CM | POA: Diagnosis present

## 2021-05-22 DIAGNOSIS — K635 Polyp of colon: Secondary | ICD-10-CM | POA: Diagnosis not present

## 2021-05-22 DIAGNOSIS — M1812 Unilateral primary osteoarthritis of first carpometacarpal joint, left hand: Secondary | ICD-10-CM | POA: Diagnosis present

## 2021-05-22 DIAGNOSIS — K5730 Diverticulosis of large intestine without perforation or abscess without bleeding: Secondary | ICD-10-CM | POA: Diagnosis not present

## 2021-05-22 DIAGNOSIS — D638 Anemia in other chronic diseases classified elsewhere: Secondary | ICD-10-CM | POA: Diagnosis not present

## 2021-05-22 DIAGNOSIS — E039 Hypothyroidism, unspecified: Secondary | ICD-10-CM | POA: Diagnosis present

## 2021-05-22 DIAGNOSIS — F32A Depression, unspecified: Secondary | ICD-10-CM | POA: Diagnosis present

## 2021-05-22 DIAGNOSIS — Z20822 Contact with and (suspected) exposure to covid-19: Secondary | ICD-10-CM | POA: Diagnosis present

## 2021-05-22 DIAGNOSIS — K623 Rectal prolapse: Secondary | ICD-10-CM | POA: Diagnosis present

## 2021-05-22 DIAGNOSIS — D62 Acute posthemorrhagic anemia: Secondary | ICD-10-CM | POA: Diagnosis present

## 2021-05-22 DIAGNOSIS — R531 Weakness: Secondary | ICD-10-CM | POA: Diagnosis not present

## 2021-05-22 DIAGNOSIS — E785 Hyperlipidemia, unspecified: Secondary | ICD-10-CM | POA: Diagnosis present

## 2021-05-22 LAB — BASIC METABOLIC PANEL
Anion gap: 6 (ref 5–15)
BUN: 29 mg/dL — ABNORMAL HIGH (ref 8–23)
CO2: 29 mmol/L (ref 22–32)
Calcium: 9.2 mg/dL (ref 8.9–10.3)
Chloride: 93 mmol/L — ABNORMAL LOW (ref 98–111)
Creatinine, Ser: 1 mg/dL (ref 0.44–1.00)
GFR, Estimated: 54 mL/min — ABNORMAL LOW (ref >60)
Glucose, Bld: 93 mg/dL (ref 70–99)
Potassium: 4 mmol/L (ref 3.5–5.1)
Sodium: 128 mmol/L — ABNORMAL LOW (ref 135–145)

## 2021-05-22 LAB — SARS CORONAVIRUS 2 (TAT 6-24 HRS): SARS Coronavirus 2: NEGATIVE

## 2021-05-22 LAB — HEMOGLOBIN A1C
Hgb A1c MFr Bld: 5.6 % (ref 4.8–5.6)
Mean Plasma Glucose: 114.02 mg/dL

## 2021-05-22 NOTE — Pre-Procedure Instructions (Signed)
COVID test- 05/22/21  PCP -Dr. Biagio Borg  Cardiologist - none Rheumatologist- Dr. Jerilynn Mages. Patel  Chest x-ray - 05/06/21-epic EKG - 01/14/21-epic Stress Test - no ECHO - no Cardiac Cath - no Pacemaker/ICD device last checked:NA  Sleep Study - no CPAP -   Fasting Blood Sugar - NA Checks Blood Sugar _____ times a day  Blood Thinner Instructions:NA Aspirin Instructions: Last Dose:  Anesthesia review:yes   Patient denies shortness of breath, fever, cough and chest pain at PAT appointment Pt has no SOB with activities. She lives with her daughter and has had bleeding with the hemorrhoids and prolapse.  Patient verbalized understanding of instructions that were given to them at the PAT appointment. Patient was also instructed that they will need to review over the PAT instructions again at home before surgery. Yes

## 2021-05-22 NOTE — Telephone Encounter (Signed)
Name of Medication: Clonazepam Name of Pharmacy: [facility] or Written Date and Quantity: 04/27/21, #30 Last Office Visit and Type: 05/06/21, allergic reaction Next Office Visit and Type: 10/09/21, 6 mo f/u Last Controlled Substance Agreement Date: 09/26/14 Last UDS: 09/26/14

## 2021-05-22 NOTE — Consult Note (Signed)
[facility] Nurse requested for preoperative stoma site marking  Discussed surgical procedure and stoma creation with patient and family.  Explained role of the [place] nurse team.  Provided the patient with educational booklet and provided samples of pouching options.  Answered patient and family questions.   Examined patient sitting and standing in order to place the marking in the patient's visual field, away from any creases or abdominal contour issues and within the rectus muscle.  Attempted to mark below the patient's belt line.   Marked for colostomy in the LLQ  _4.5_ cm to the left of the umbilicus and 2_cm below the umbilicus.  Patient's abdomen cleansed with CHG wipes at site markings, allowed to air dry prior to marking.Covered mark with thin film transparent dressing to preserve mark until date of surgery.   [facility] Nurse team will follow up with patient after surgery for continue ostomy care and teaching.  [place] MSN, [place], [place], [place]

## 2021-05-22 NOTE — Progress Notes (Signed)
Anesthesia Chart Review   Case: [id number] Date/Time: 05/26/21 1245   Procedures:      ROBOTIC LOW ANTERIOR RECTOSIGMOID RESECTION     RECTOPEXY     POSSIBLE OSTOMY     RIGID PROCTOSCOPY   Anesthesia type: General   Pre-op diagnosis: RECTAL PROLAPSE   Location: [facility] ROOM 03 / [facility]   Surgeons: Michael Boston, MD       DISCUSSION:88 y.o. never smoker with h/o HTN, GERD, mild AV stenosis, grade 2 diastolic dysfunction, CKD Stage III, rectal prolapse scheduled for above procedure 05/26/2021 with Dr. Michael Boston.   Per PCP note, "Medically cleared for upcoming surgical intervention for symptomatic rectal prolapse."  Per cardiology preoperative evaluation, "Chart reviewed as part of pre-operative protocol coverage. Given past medical history and time since last visit, based on ACC/AHA guidelines, Brooke Beasley would be at acceptable risk for the planned procedure without further cardiovascular testing.    Her RCRI is a class II risk, 0.9% risk of major cardiac event."  Anticipate pt can proceed with planned procedure barring acute status change.   VS: Pulse 71    Temp 36.6 C (Oral)    Resp 20    SpO2 96%   PROVIDERS: [name], MD is PCP   Primary Cardiologist: Kate Sable, MD LABS:  recheck sodium DOS (all labs ordered are listed, but only abnormal results are displayed)  Labs Reviewed  BASIC METABOLIC PANEL - Abnormal; Notable for the following components:      Result Value   Sodium 128 (*)    Chloride 93 (*)    BUN 29 (*)    GFR, Estimated 54 (*)    All other components within normal limits  HEMOGLOBIN A1C     IMAGES:   EKG: 01/14/21 Rate 92 bpm  Sinus rhythm with premature atrial complexes  CV: Echo 03/18/2021 1. Left ventricular ejection fraction, by estimation, is 60 to 65%. The  left ventricle has normal function. The left ventricle has no regional  wall motion abnormalities. There is mild left ventricular hypertrophy.  Left ventricular  diastolic parameters  are consistent with Grade II diastolic dysfunction (pseudonormalization).   2. Right ventricular systolic function is normal. The right ventricular  size is normal. There is mildly elevated pulmonary artery systolic  pressure. The estimated right ventricular systolic pressure is 04.5 mmHg.   3. Left atrial size was mildly dilated.   4. The mitral valve is normal in structure. Mild to moderate mitral valve  regurgitation. No evidence of mitral stenosis.   5. The aortic valve is normal in structure. Aortic valve regurgitation is  not visualized. Mild aortic valve stenosis. Aortic valve area, by VTI  measures 1.48 cm. Aortic valve mean gradient measures 14.5 mmHg. Aortic  valve Vmax measures 2.61 m/s.   6. The inferior vena cava is dilated in size with >50% respiratory  variability, suggesting right atrial pressure of 8 mmHg.  Past Medical History:  Diagnosis Date   Anxiety    C. difficile diarrhea 07/11/2018   S/p multiple oral vanc treatments (course and tapers) and completed Zinplava monoclonal Ab treatment (05/10/2019) Fecal transplant on hold during COVID pandemic   CAP (community acquired pneumonia) 12/22/2017   Carotid stenosis 10/18/2014   R 1-39%, L 40-59%, rpt 1 yr (09/2014)    CKD (chronic kidney disease) stage 3, GFR 30-59 ml/min (HCC) 08/02/2014   Clostridioides difficile infection    hx 2020   COVID-19 virus infection 10/13/2020   Degenerative disc disease  LS   Depression    nervous breakdonw in 1964-out of work for a year   Diverticulosis    severe by colonoscopy   Family history of adverse reaction to anesthesia    n/v   GERD (gastroesophageal reflux disease)    Glaucoma    Heart murmur 08/2011   mitral regurge - on echo    History of hiatal hernia    History of shingles    HTN (hypertension)    Hyperlipidemia    Hypertension    Hypothyroidism    IBS 11/02/2006   Intestinal bacterial overgrowth    In small colon   Lower GI bleed  12/2020   thought diverticular complicated by ABLA with syncope and orbital fracture s/p hospitalization   Maxillary fracture ([place]) 04/08/2012   Osteoarthritis 2016   Jefm Bryant)   Osteoporosis    dexa 2011   Pseudogout 2016   shoulders ([place])    Past Surgical History:  Procedure Laterality Date   barium enema  2012   severe diverticulosis, redundant colon   Bowel obstruction  1999   no surgery in hosp x 3 days   COLONOSCOPY  1. 1999  2. 11/04   1. Not finished  2. Slight hemorrhage rectosigmoid area, severe sig diverticulosis   COLONOSCOPY WITH PROPOFOL N/A 09/10/2019   SSP with dysplasia, TA, rpt 3 yrs Marius Ditch, Tally Due, MD)   Dexa  1. [phone]   2. 9/04  3. 3/08    1. OP  2. OP, borderline, spine -2.44T  3. decreased BMD-OP   DG KNEE 1-2 VIEWS BILAT     LS x-ray with degenerative disc and facet change   ESOPHAGOGASTRODUODENOSCOPY     Negative   FLEXIBLE SIGMOIDOSCOPY N/A 01/16/2021   Procedure: FLEXIBLE SIGMOIDOSCOPY;  Surgeon: Gatha Mayer, MD;  Location: [facility];  Service: Endoscopy;  Laterality: N/A;  or unsedated   Hemorrhoid procedure  07/2006   TONSILLECTOMY     TOTAL SHOULDER ARTHROPLASTY Left 11/27/2015   Corky Mull, MD   TOTAL SHOULDER REVISION Left 03/16/2016   Procedure: TOTAL SHOULDER REVISION;  Surgeon: Corky Mull, MD;  Location: [facility] OR[facility];  Service: Orthopedics;  Laterality: Left;   US ECHOCARDIOGRAPHY  [id number]   Normal systolic fxn with EF 63-87%.  Focal basal septal hypertrophy.  Mild diastolic dysfunction.  Mild MR.    MEDICATIONS:  acetaminophen (TYLENOL) 500 MG tablet   albuterol (PROVENTIL HFA;VENTOLIN HFA) 108 (90 Base) MCG/ACT inhaler   ALPHAGAN P 0.1 % SOLN   bimatoprost (LUMIGAN) 0.01 % SOLN   Bioflavonoid Products (VITAMIN C) CHEW   bisacodyl (DULCOLAX) 5 MG EC tablet   cholecalciferol (VITAMIN D) 1000 units tablet   clonazePAM (KLONOPIN) 0.5 MG tablet   colchicine 0.6 MG tablet   denosumab (PROLIA) 60 MG/ML SOSY injection    docusate sodium (COLACE) 100 MG capsule   Famotidine-Ca Carb-Mag Hydrox (PEPCID COMPLETE PO)   furosemide (LASIX) 20 MG tablet   hydrocortisone (ANUSOL-HC) 25 MG suppository   hydrocortisone 2.5 % cream   hydrOXYzine (ATARAX) 25 MG tablet   irbesartan (AVAPRO) 150 MG tablet   Iron-FA-B Cmp-C-Biot-Probiotic (FUSION PLUS) CAPS   levothyroxine (SYNTHROID) 100 MCG tablet   Misc Natural Products (OSTEO BI-FLEX TRIPLE STRENGTH PO)   Multiple Vitamin (MULTIVITAMIN ADULT PO)   omeprazole (PRILOSEC) 20 MG capsule   ondansetron (ZOFRAN) 4 MG tablet   polyethylene glycol (MIRALAX / GLYCOLAX) 17 g packet   sertraline (ZOLOFT) 100 MG tablet   Simethicone 180 MG CAPS  spironolactone (ALDACTONE) 25 MG tablet   traMADol (ULTRAM) 50 MG tablet   VOLTAREN 1 % GEL   Wheat Dextrin (BENEFIBER DRINK MIX PO)   No current facility-administered medications for this encounter.   Konrad Felix Ward, PA-C [facility]-Surgical Testing [phone]

## 2021-05-23 NOTE — Telephone Encounter (Signed)
ERx 

## 2021-05-26 ENCOUNTER — Inpatient Hospital Stay (HOSPITAL_COMMUNITY)
Admission: RE | Admit: 2021-05-26 | Discharge: 2021-05-30 | DRG: 330 | Disposition: A | Discharge status: Home Health | Payer: PPO | Attending: Surgery | Admitting: Surgery

## 2021-05-26 ENCOUNTER — Encounter (HOSPITAL_COMMUNITY)
Admission: RE | Disposition: A | Discharge status: Home Health | Payer: Self-pay | Source: Home / Self Care | Attending: Surgery

## 2021-05-26 ENCOUNTER — Encounter (HOSPITAL_COMMUNITY): Payer: Self-pay | Admitting: Surgery

## 2021-05-26 ENCOUNTER — Inpatient Hospital Stay (HOSPITAL_COMMUNITY): Payer: PPO | Admitting: Anesthesiology

## 2021-05-26 ENCOUNTER — Other Ambulatory Visit (HOSPITAL_COMMUNITY): Payer: Self-pay

## 2021-05-26 DIAGNOSIS — Z01818 Encounter for other preprocedural examination: Secondary | ICD-10-CM

## 2021-05-26 DIAGNOSIS — K635 Polyp of colon: Secondary | ICD-10-CM | POA: Diagnosis not present

## 2021-05-26 DIAGNOSIS — E876 Hypokalemia: Secondary | ICD-10-CM | POA: Diagnosis present

## 2021-05-26 DIAGNOSIS — Z91018 Allergy to other foods: Secondary | ICD-10-CM

## 2021-05-26 DIAGNOSIS — K5730 Diverticulosis of large intestine without perforation or abscess without bleeding: Secondary | ICD-10-CM | POA: Diagnosis not present

## 2021-05-26 DIAGNOSIS — Z8261 Family history of arthritis: Secondary | ICD-10-CM

## 2021-05-26 DIAGNOSIS — D62 Acute posthemorrhagic anemia: Secondary | ICD-10-CM | POA: Diagnosis present

## 2021-05-26 DIAGNOSIS — L299 Pruritus, unspecified: Secondary | ICD-10-CM | POA: Diagnosis present

## 2021-05-26 DIAGNOSIS — G2581 Restless legs syndrome: Secondary | ICD-10-CM | POA: Diagnosis present

## 2021-05-26 DIAGNOSIS — I129 Hypertensive chronic kidney disease with stage 1 through stage 4 chronic kidney disease, or unspecified chronic kidney disease: Secondary | ICD-10-CM | POA: Diagnosis present

## 2021-05-26 DIAGNOSIS — E039 Hypothyroidism, unspecified: Secondary | ICD-10-CM | POA: Diagnosis not present

## 2021-05-26 DIAGNOSIS — R42 Dizziness and giddiness: Secondary | ICD-10-CM

## 2021-05-26 DIAGNOSIS — Z8701 Personal history of pneumonia (recurrent): Secondary | ICD-10-CM

## 2021-05-26 DIAGNOSIS — N183 Chronic kidney disease, stage 3 unspecified: Secondary | ICD-10-CM | POA: Diagnosis present

## 2021-05-26 DIAGNOSIS — Z79890 Hormone replacement therapy (postmenopausal): Secondary | ICD-10-CM

## 2021-05-26 DIAGNOSIS — Z885 Allergy status to narcotic agent status: Secondary | ICD-10-CM

## 2021-05-26 DIAGNOSIS — K581 Irritable bowel syndrome with constipation: Secondary | ICD-10-CM | POA: Diagnosis present

## 2021-05-26 DIAGNOSIS — H409 Unspecified glaucoma: Secondary | ICD-10-CM | POA: Diagnosis present

## 2021-05-26 DIAGNOSIS — Q438 Other specified congenital malformations of intestine: Secondary | ICD-10-CM

## 2021-05-26 DIAGNOSIS — K922 Gastrointestinal hemorrhage, unspecified: Secondary | ICD-10-CM

## 2021-05-26 DIAGNOSIS — Z8616 Personal history of COVID-19: Secondary | ICD-10-CM

## 2021-05-26 DIAGNOSIS — I10 Essential (primary) hypertension: Secondary | ICD-10-CM | POA: Diagnosis not present

## 2021-05-26 DIAGNOSIS — E785 Hyperlipidemia, unspecified: Secondary | ICD-10-CM | POA: Diagnosis present

## 2021-05-26 DIAGNOSIS — K219 Gastro-esophageal reflux disease without esophagitis: Secondary | ICD-10-CM | POA: Diagnosis present

## 2021-05-26 DIAGNOSIS — Z882 Allergy status to sulfonamides status: Secondary | ICD-10-CM

## 2021-05-26 DIAGNOSIS — M810 Age-related osteoporosis without current pathological fracture: Secondary | ICD-10-CM | POA: Diagnosis present

## 2021-05-26 DIAGNOSIS — I959 Hypotension, unspecified: Secondary | ICD-10-CM | POA: Diagnosis not present

## 2021-05-26 DIAGNOSIS — K623 Rectal prolapse: Principal | ICD-10-CM | POA: Diagnosis present

## 2021-05-26 DIAGNOSIS — N1832 Chronic kidney disease, stage 3b: Secondary | ICD-10-CM | POA: Diagnosis present

## 2021-05-26 DIAGNOSIS — D638 Anemia in other chronic diseases classified elsewhere: Secondary | ICD-10-CM | POA: Diagnosis not present

## 2021-05-26 DIAGNOSIS — F419 Anxiety disorder, unspecified: Secondary | ICD-10-CM | POA: Diagnosis present

## 2021-05-26 DIAGNOSIS — H401190 Primary open-angle glaucoma, unspecified eye, stage unspecified: Secondary | ICD-10-CM | POA: Diagnosis present

## 2021-05-26 DIAGNOSIS — K648 Other hemorrhoids: Secondary | ICD-10-CM | POA: Diagnosis present

## 2021-05-26 DIAGNOSIS — Z79899 Other long term (current) drug therapy: Secondary | ICD-10-CM

## 2021-05-26 DIAGNOSIS — Z8249 Family history of ischemic heart disease and other diseases of the circulatory system: Secondary | ICD-10-CM

## 2021-05-26 DIAGNOSIS — K5909 Other constipation: Secondary | ICD-10-CM | POA: Diagnosis present

## 2021-05-26 DIAGNOSIS — R531 Weakness: Secondary | ICD-10-CM | POA: Diagnosis not present

## 2021-05-26 DIAGNOSIS — R011 Cardiac murmur, unspecified: Secondary | ICD-10-CM | POA: Diagnosis present

## 2021-05-26 DIAGNOSIS — F418 Other specified anxiety disorders: Secondary | ICD-10-CM | POA: Diagnosis present

## 2021-05-26 DIAGNOSIS — Z20822 Contact with and (suspected) exposure to covid-19: Secondary | ICD-10-CM | POA: Diagnosis present

## 2021-05-26 DIAGNOSIS — D509 Iron deficiency anemia, unspecified: Secondary | ICD-10-CM | POA: Diagnosis present

## 2021-05-26 DIAGNOSIS — R159 Full incontinence of feces: Secondary | ICD-10-CM | POA: Diagnosis present

## 2021-05-26 DIAGNOSIS — M1812 Unilateral primary osteoarthritis of first carpometacarpal joint, left hand: Secondary | ICD-10-CM | POA: Diagnosis present

## 2021-05-26 DIAGNOSIS — Z96612 Presence of left artificial shoulder joint: Secondary | ICD-10-CM | POA: Diagnosis present

## 2021-05-26 DIAGNOSIS — Z8619 Personal history of other infectious and parasitic diseases: Secondary | ICD-10-CM

## 2021-05-26 DIAGNOSIS — Z887 Allergy status to serum and vaccine status: Secondary | ICD-10-CM

## 2021-05-26 DIAGNOSIS — Z833 Family history of diabetes mellitus: Secondary | ICD-10-CM

## 2021-05-26 DIAGNOSIS — N1831 Chronic kidney disease, stage 3a: Secondary | ICD-10-CM

## 2021-05-26 DIAGNOSIS — F32A Depression, unspecified: Secondary | ICD-10-CM | POA: Diagnosis present

## 2021-05-26 DIAGNOSIS — H9193 Unspecified hearing loss, bilateral: Secondary | ICD-10-CM | POA: Diagnosis present

## 2021-05-26 DIAGNOSIS — G4700 Insomnia, unspecified: Secondary | ICD-10-CM | POA: Diagnosis present

## 2021-05-26 DIAGNOSIS — K641 Second degree hemorrhoids: Secondary | ICD-10-CM | POA: Diagnosis not present

## 2021-05-26 DIAGNOSIS — R7303 Prediabetes: Secondary | ICD-10-CM | POA: Diagnosis present

## 2021-05-26 DIAGNOSIS — Z888 Allergy status to other drugs, medicaments and biological substances status: Secondary | ICD-10-CM

## 2021-05-26 HISTORY — PX: XI ROBOTIC ASSISTED LOWER ANTERIOR RESECTION: SHX6558

## 2021-05-26 HISTORY — PX: PROCTOSCOPY: SHX2266

## 2021-05-26 HISTORY — PX: RECTOPEXY: SHX5700

## 2021-05-26 LAB — TYPE AND SCREEN
ABO/RH(D): O NEG
Antibody Screen: NEGATIVE

## 2021-05-26 SURGERY — RESECTION, RECTUM, LOW ANTERIOR, ROBOT-ASSISTED
Anesthesia: General

## 2021-05-26 MED ORDER — ENSURE PRE-SURGERY PO LIQD: 296.0000 mL | Freq: Once | ORAL | Status: DC

## 2021-05-26 MED ORDER — LACTATED RINGERS IV SOLN: INTRAVENOUS | Status: DC

## 2021-05-26 MED ORDER — METOPROLOL TARTRATE 5 MG/5ML IV SOLN
5.0000 mg | Freq: Four times a day (QID) | INTRAVENOUS | Status: DC | PRN
  Administered 2021-05-30: 5 mg via INTRAVENOUS
  Filled 2021-05-26: qty 5

## 2021-05-26 MED ORDER — HYDROMORPHONE HCL 1 MG/ML IJ SOLN: 0.5000 mg | INTRAMUSCULAR | Status: DC | PRN

## 2021-05-26 MED ORDER — SUGAMMADEX SODIUM 200 MG/2ML IV SOLN
INTRAVENOUS | Status: DC | PRN
  Administered 2021-05-26: 150 mg via INTRAVENOUS

## 2021-05-26 MED ORDER — BUPIVACAINE LIPOSOME 1.3 % IJ SUSP
INTRAMUSCULAR | Status: AC
  Filled 2021-05-26: qty 20

## 2021-05-26 MED ORDER — SODIUM CHLORIDE 0.9 % IV SOLN
2.0000 g | Freq: Two times a day (BID) | INTRAVENOUS | Status: AC
  Administered 2021-05-27: 2 g via INTRAVENOUS
  Filled 2021-05-26: qty 2

## 2021-05-26 MED ORDER — PROCHLORPERAZINE EDISYLATE 10 MG/2ML IJ SOLN: 5.0000 mg | Freq: Four times a day (QID) | INTRAMUSCULAR | Status: DC | PRN

## 2021-05-26 MED ORDER — ROCURONIUM BROMIDE 10 MG/ML (PF) SYRINGE
PREFILLED_SYRINGE | INTRAVENOUS | Status: AC
  Filled 2021-05-26: qty 10

## 2021-05-26 MED ORDER — BUPIVACAINE LIPOSOME 1.3 % IJ SUSP
INTRAMUSCULAR | Status: DC | PRN
  Administered 2021-05-26: 20 mL

## 2021-05-26 MED ORDER — FENTANYL CITRATE PF 50 MCG/ML IJ SOSY: 25.0000 ug | PREFILLED_SYRINGE | INTRAMUSCULAR | Status: DC | PRN

## 2021-05-26 MED ORDER — LIP MEDEX EX OINT
1.0000 "application " | TOPICAL_OINTMENT | Freq: Two times a day (BID) | CUTANEOUS | Status: DC
  Administered 2021-05-26 – 2021-05-30 (×8): 1 via TOPICAL
  Filled 2021-05-26 (×3): qty 7

## 2021-05-26 MED ORDER — INDOCYANINE GREEN 25 MG IV SOLR
INTRAVENOUS | Status: DC | PRN
  Administered 2021-05-26: 10 mg via INTRAVENOUS

## 2021-05-26 MED ORDER — SIMETHICONE 80 MG PO CHEW: 40.0000 mg | CHEWABLE_TABLET | Freq: Four times a day (QID) | ORAL | Status: DC | PRN

## 2021-05-26 MED ORDER — PHENYLEPHRINE HCL-NACL 20-0.9 MG/250ML-% IV SOLN
0.0000 ug/min | INTRAVENOUS | Status: DC
  Administered 2021-05-26: 30 ug/min via INTRAVENOUS
  Administered 2021-05-26: 40 ug/min via INTRAVENOUS
  Administered 2021-05-26: 35 ug/min via INTRAVENOUS
  Administered 2021-05-27: 30 ug/min via INTRAVENOUS
  Administered 2021-05-27: 40 ug/min via INTRAVENOUS
  Filled 2021-05-26 (×2): qty 250

## 2021-05-26 MED ORDER — METRONIDAZOLE 500 MG PO TABS: 1000.0000 mg | ORAL_TABLET | ORAL | Status: DC

## 2021-05-26 MED ORDER — ENOXAPARIN SODIUM 40 MG/0.4ML IJ SOSY
40.0000 mg | PREFILLED_SYRINGE | INTRAMUSCULAR | Status: DC
  Administered 2021-05-27: 40 mg via SUBCUTANEOUS
  Filled 2021-05-26: qty 0.4

## 2021-05-26 MED ORDER — COLCHICINE 0.6 MG PO TABS
0.6000 mg | ORAL_TABLET | Freq: Every day | ORAL | Status: DC | PRN
  Filled 2021-05-26: qty 1

## 2021-05-26 MED ORDER — SODIUM CHLORIDE (PF) 0.9 % IJ SOLN
INTRAMUSCULAR | Status: AC
  Filled 2021-05-26: qty 10

## 2021-05-26 MED ORDER — PHENYLEPHRINE 40 MCG/ML (10ML) SYRINGE FOR IV PUSH (FOR BLOOD PRESSURE SUPPORT)
PREFILLED_SYRINGE | INTRAVENOUS | Status: DC | PRN
  Administered 2021-05-26: 40 ug via INTRAVENOUS

## 2021-05-26 MED ORDER — FENTANYL CITRATE (PF) 250 MCG/5ML IJ SOLN
INTRAMUSCULAR | Status: AC
  Filled 2021-05-26: qty 5

## 2021-05-26 MED ORDER — LACTATED RINGERS IV SOLN: INTRAVENOUS | Status: DC | PRN

## 2021-05-26 MED ORDER — ACETAMINOPHEN 500 MG PO TABS
1000.0000 mg | ORAL_TABLET | ORAL | Status: AC
  Administered 2021-05-26: 1000 mg via ORAL
  Filled 2021-05-26: qty 2

## 2021-05-26 MED ORDER — ONDANSETRON HCL 4 MG/2ML IJ SOLN: 4.0000 mg | Freq: Four times a day (QID) | INTRAMUSCULAR | Status: DC | PRN

## 2021-05-26 MED ORDER — ROCURONIUM BROMIDE 10 MG/ML (PF) SYRINGE
PREFILLED_SYRINGE | INTRAVENOUS | Status: DC | PRN
Start: 2021-05-26 — End: 2021-05-26
  Administered 2021-05-26: 10 mg via INTRAVENOUS
  Administered 2021-05-26: 60 mg via INTRAVENOUS
  Administered 2021-05-26: 10 mg via INTRAVENOUS

## 2021-05-26 MED ORDER — EPHEDRINE SULFATE-NACL 50-0.9 MG/10ML-% IV SOSY
PREFILLED_SYRINGE | INTRAVENOUS | Status: DC | PRN
Start: 2021-05-26 — End: 2021-05-26
  Administered 2021-05-26: 10 mg via INTRAVENOUS
  Administered 2021-05-26 (×2): 5 mg via INTRAVENOUS

## 2021-05-26 MED ORDER — ONDANSETRON HCL 4 MG PO TABS
4.0000 mg | ORAL_TABLET | Freq: Three times a day (TID) | ORAL | 5 refills | Status: DC | PRN
  Filled 2021-05-26: qty 8, 3d supply, fill #0

## 2021-05-26 MED ORDER — HYDROXYZINE HCL 25 MG PO TABS
25.0000 mg | ORAL_TABLET | Freq: Two times a day (BID) | ORAL | 0 refills | Status: DC | PRN
  Filled 2021-05-26: qty 40, 20d supply, fill #0

## 2021-05-26 MED ORDER — SODIUM CHLORIDE 0.9 % IV SOLN
2.0000 g | INTRAVENOUS | Status: AC
  Administered 2021-05-26: 2 g via INTRAVENOUS
  Filled 2021-05-26: qty 2

## 2021-05-26 MED ORDER — DEXAMETHASONE SODIUM PHOSPHATE 10 MG/ML IJ SOLN
INTRAMUSCULAR | Status: AC
  Filled 2021-05-26: qty 1

## 2021-05-26 MED ORDER — PROCHLORPERAZINE MALEATE 10 MG PO TABS
10.0000 mg | ORAL_TABLET | Freq: Four times a day (QID) | ORAL | Status: DC | PRN
  Filled 2021-05-26: qty 1

## 2021-05-26 MED ORDER — FENTANYL CITRATE (PF) 100 MCG/2ML IJ SOLN
INTRAMUSCULAR | Status: AC
  Filled 2021-05-26: qty 2

## 2021-05-26 MED ORDER — LACTATED RINGERS IR SOLN
Status: DC | PRN
  Administered 2021-05-26: 1

## 2021-05-26 MED ORDER — CALCIUM POLYCARBOPHIL 625 MG PO TABS
625.0000 mg | ORAL_TABLET | Freq: Two times a day (BID) | ORAL | Status: DC
  Administered 2021-05-26 – 2021-05-28 (×5): 625 mg via ORAL
  Filled 2021-05-26 (×6): qty 1

## 2021-05-26 MED ORDER — FENTANYL CITRATE (PF) 250 MCG/5ML IJ SOLN
INTRAMUSCULAR | Status: DC | PRN
Start: 2021-05-26 — End: 2021-05-26
  Administered 2021-05-26 (×4): 50 ug via INTRAVENOUS

## 2021-05-26 MED ORDER — GABAPENTIN 300 MG PO CAPS
300.0000 mg | ORAL_CAPSULE | ORAL | Status: AC
  Administered 2021-05-26: 300 mg via ORAL
  Filled 2021-05-26: qty 1

## 2021-05-26 MED ORDER — ALUM & MAG HYDROXIDE-SIMETH 200-200-20 MG/5ML PO SUSP
30.0000 mL | Freq: Four times a day (QID) | ORAL | Status: DC | PRN
  Administered 2021-05-27: 30 mL via ORAL
  Filled 2021-05-26: qty 30

## 2021-05-26 MED ORDER — DIPHENHYDRAMINE HCL 50 MG/ML IJ SOLN: 12.5000 mg | Freq: Four times a day (QID) | INTRAMUSCULAR | Status: DC | PRN

## 2021-05-26 MED ORDER — PROPOFOL 10 MG/ML IV BOLUS
INTRAVENOUS | Status: AC
  Filled 2021-05-26: qty 20

## 2021-05-26 MED ORDER — SODIUM CHLORIDE 0.9 % IV SOLN: Freq: Three times a day (TID) | INTRAVENOUS | Status: DC | PRN

## 2021-05-26 MED ORDER — ONDANSETRON HCL 4 MG/2ML IJ SOLN
INTRAMUSCULAR | Status: AC
  Filled 2021-05-26: qty 2

## 2021-05-26 MED ORDER — BUPIVACAINE-EPINEPHRINE 0.25% -1:200000 IJ SOLN
INTRAMUSCULAR | Status: AC
  Filled 2021-05-26: qty 1

## 2021-05-26 MED ORDER — BUPIVACAINE LIPOSOME 1.3 % IJ SUSP: 20.0000 mL | Freq: Once | INTRAMUSCULAR | Status: DC

## 2021-05-26 MED ORDER — POLYETHYLENE GLYCOL 3350 17 GM/SCOOP PO POWD: 1.0000 | Freq: Once | ORAL | Status: DC

## 2021-05-26 MED ORDER — 0.9 % SODIUM CHLORIDE (POUR BTL) OPTIME
TOPICAL | Status: DC | PRN
  Administered 2021-05-26: 2000 mL

## 2021-05-26 MED ORDER — PHENYLEPHRINE HCL-NACL 20-0.9 MG/250ML-% IV SOLN
INTRAVENOUS | Status: DC | PRN
Start: 2021-05-26 — End: 2021-05-26
  Administered 2021-05-26: 40 ug/min via INTRAVENOUS

## 2021-05-26 MED ORDER — MELATONIN 3 MG PO TABS: 3.0000 mg | ORAL_TABLET | Freq: Every evening | ORAL | Status: DC | PRN

## 2021-05-26 MED ORDER — NEOMYCIN SULFATE 500 MG PO TABS: 1000.0000 mg | ORAL_TABLET | ORAL | Status: DC

## 2021-05-26 MED ORDER — CHLORHEXIDINE GLUCONATE 0.12 % MT SOLN
15.0000 mL | Freq: Once | OROMUCOSAL | Status: AC
  Administered 2021-05-26: 15 mL via OROMUCOSAL

## 2021-05-26 MED ORDER — LIDOCAINE HCL (PF) 2 % IJ SOLN
INTRAMUSCULAR | Status: AC
  Filled 2021-05-26: qty 5

## 2021-05-26 MED ORDER — ORAL CARE MOUTH RINSE: 15.0000 mL | Freq: Once | OROMUCOSAL | Status: AC

## 2021-05-26 MED ORDER — LIDOCAINE 2% (20 MG/ML) 5 ML SYRINGE
INTRAMUSCULAR | Status: DC | PRN
  Administered 2021-05-26: 60 mg via INTRAVENOUS

## 2021-05-26 MED ORDER — ENALAPRILAT 1.25 MG/ML IV SOLN
0.6250 mg | Freq: Four times a day (QID) | INTRAVENOUS | Status: DC | PRN
  Filled 2021-05-26: qty 1

## 2021-05-26 MED ORDER — SODIUM CHLORIDE 0.9% FLUSH: 3.0000 mL | INTRAVENOUS | Status: DC | PRN

## 2021-05-26 MED ORDER — LABETALOL HCL 5 MG/ML IV SOLN
INTRAVENOUS | Status: DC | PRN
  Administered 2021-05-26: 5 mg via INTRAVENOUS

## 2021-05-26 MED ORDER — ENSURE SURGERY PO LIQD
237.0000 mL | Freq: Two times a day (BID) | ORAL | Status: DC
  Administered 2021-05-27 – 2021-05-30 (×5): 237 mL via ORAL
  Filled 2021-05-26 (×4): qty 237

## 2021-05-26 MED ORDER — METHOCARBAMOL 1000 MG/10ML IJ SOLN
1000.0000 mg | Freq: Four times a day (QID) | INTRAVENOUS | Status: DC | PRN
  Filled 2021-05-26: qty 10

## 2021-05-26 MED ORDER — BISACODYL 5 MG PO TBEC: 20.0000 mg | DELAYED_RELEASE_TABLET | Freq: Once | ORAL | Status: DC

## 2021-05-26 MED ORDER — ALVIMOPAN 12 MG PO CAPS
12.0000 mg | ORAL_CAPSULE | Freq: Two times a day (BID) | ORAL | Status: DC
  Administered 2021-05-27: 12 mg via ORAL
  Filled 2021-05-26: qty 1

## 2021-05-26 MED ORDER — BRIMONIDINE TARTRATE 0.15 % OP SOLN
1.0000 [drp] | Freq: Every day | OPHTHALMIC | Status: DC
  Administered 2021-05-27 – 2021-05-28 (×2): 1 [drp] via OPHTHALMIC
  Filled 2021-05-26: qty 5

## 2021-05-26 MED ORDER — LATANOPROST 0.005 % OP SOLN
1.0000 [drp] | Freq: Every day | OPHTHALMIC | Status: DC
  Administered 2021-05-26 – 2021-05-29 (×4): 1 [drp] via OPHTHALMIC
  Filled 2021-05-26: qty 2.5

## 2021-05-26 MED ORDER — ONDANSETRON HCL 4 MG/2ML IJ SOLN
INTRAMUSCULAR | Status: DC | PRN
  Administered 2021-05-26: 4 mg via INTRAVENOUS

## 2021-05-26 MED ORDER — METHOCARBAMOL 500 MG PO TABS: 1000.0000 mg | ORAL_TABLET | Freq: Four times a day (QID) | ORAL | Status: DC | PRN

## 2021-05-26 MED ORDER — PHENYLEPHRINE HCL-NACL 20-0.9 MG/250ML-% IV SOLN
INTRAVENOUS | Status: AC
  Administered 2021-05-26: 30 ug/min via INTRAVENOUS
  Filled 2021-05-26: qty 250

## 2021-05-26 MED ORDER — BUPIVACAINE-EPINEPHRINE (PF) 0.25% -1:200000 IJ SOLN
INTRAMUSCULAR | Status: DC | PRN
  Administered 2021-05-26: 50 mL via PERINEURAL

## 2021-05-26 MED ORDER — CHLORHEXIDINE GLUCONATE CLOTH 2 % EX PADS
6.0000 | MEDICATED_PAD | Freq: Every day | CUTANEOUS | Status: DC
  Administered 2021-05-27 – 2021-05-28 (×2): 6 via TOPICAL

## 2021-05-26 MED ORDER — LEVOTHYROXINE SODIUM 100 MCG PO TABS
100.0000 ug | ORAL_TABLET | Freq: Every day | ORAL | Status: DC
  Administered 2021-05-27 – 2021-05-30 (×4): 100 ug via ORAL
  Filled 2021-05-26 (×4): qty 1

## 2021-05-26 MED ORDER — SPIRONOLACTONE 25 MG PO TABS
25.0000 mg | ORAL_TABLET | Freq: Every day | ORAL | Status: DC
  Administered 2021-05-27 – 2021-05-30 (×4): 25 mg via ORAL
  Filled 2021-05-26 (×4): qty 1

## 2021-05-26 MED ORDER — DEXAMETHASONE SODIUM PHOSPHATE 10 MG/ML IJ SOLN
INTRAMUSCULAR | Status: DC | PRN
  Administered 2021-05-26: 4 mg via INTRAVENOUS

## 2021-05-26 MED ORDER — ALVIMOPAN 12 MG PO CAPS
12.0000 mg | ORAL_CAPSULE | ORAL | Status: AC
  Administered 2021-05-26: 12 mg via ORAL
  Filled 2021-05-26: qty 1

## 2021-05-26 MED ORDER — ENSURE PRE-SURGERY PO LIQD: 592.0000 mL | Freq: Once | ORAL | Status: DC

## 2021-05-26 MED ORDER — ACETAMINOPHEN 500 MG PO TABS
1000.0000 mg | ORAL_TABLET | Freq: Four times a day (QID) | ORAL | Status: DC
  Administered 2021-05-26 – 2021-05-27 (×2): 1000 mg via ORAL
  Filled 2021-05-26 (×2): qty 2

## 2021-05-26 MED ORDER — CLONAZEPAM 0.5 MG PO TABS
0.5000 mg | ORAL_TABLET | Freq: Every day | ORAL | Status: DC
  Administered 2021-05-26 – 2021-05-29 (×4): 0.5 mg via ORAL
  Filled 2021-05-26 (×5): qty 1

## 2021-05-26 MED ORDER — GLYCOPYRROLATE 0.2 MG/ML IJ SOLN
INTRAMUSCULAR | Status: DC | PRN
  Administered 2021-05-26: .2 mg via INTRAVENOUS

## 2021-05-26 MED ORDER — ENOXAPARIN SODIUM 40 MG/0.4ML IJ SOSY
40.0000 mg | PREFILLED_SYRINGE | Freq: Once | INTRAMUSCULAR | Status: AC
  Administered 2021-05-26: 40 mg via SUBCUTANEOUS
  Filled 2021-05-26: qty 0.4

## 2021-05-26 MED ORDER — ALBUTEROL SULFATE (2.5 MG/3ML) 0.083% IN NEBU: 2.5000 mg | INHALATION_SOLUTION | Freq: Four times a day (QID) | RESPIRATORY_TRACT | Status: DC | PRN

## 2021-05-26 MED ORDER — PROPOFOL 10 MG/ML IV BOLUS
INTRAVENOUS | Status: DC | PRN
  Administered 2021-05-26: 80 mg via INTRAVENOUS

## 2021-05-26 MED ORDER — DICLOFENAC SODIUM 1 % EX GEL
2.0000 g | Freq: Three times a day (TID) | CUTANEOUS | Status: DC | PRN
  Filled 2021-05-26: qty 100

## 2021-05-26 MED ORDER — SODIUM CHLORIDE 0.9% FLUSH
3.0000 mL | Freq: Two times a day (BID) | INTRAVENOUS | Status: DC
  Administered 2021-05-26 – 2021-05-29 (×6): 3 mL via INTRAVENOUS

## 2021-05-26 MED ORDER — TRAMADOL HCL 50 MG PO TABS
50.0000 mg | ORAL_TABLET | Freq: Four times a day (QID) | ORAL | Status: DC | PRN
  Administered 2021-05-30: 50 mg via ORAL
  Filled 2021-05-26: qty 1

## 2021-05-26 MED ORDER — SODIUM CHLORIDE 0.9 % IV SOLN: 250.0000 mL | INTRAVENOUS | Status: DC | PRN

## 2021-05-26 MED ORDER — ONDANSETRON HCL 4 MG PO TABS: 4.0000 mg | ORAL_TABLET | Freq: Four times a day (QID) | ORAL | Status: DC | PRN

## 2021-05-26 MED ORDER — MAGIC MOUTHWASH
15.0000 mL | Freq: Four times a day (QID) | ORAL | Status: DC | PRN
  Filled 2021-05-26: qty 15

## 2021-05-26 MED ORDER — DIPHENHYDRAMINE HCL 12.5 MG/5ML PO ELIX: 12.5000 mg | ORAL_SOLUTION | Freq: Four times a day (QID) | ORAL | Status: DC | PRN

## 2021-05-26 MED ORDER — LACTATED RINGERS IV BOLUS
1000.0000 mL | Freq: Three times a day (TID) | INTRAVENOUS | Status: DC | PRN
  Administered 2021-05-26 – 2021-05-27 (×2): 1000 mL via INTRAVENOUS

## 2021-05-26 SURGICAL SUPPLY — 111 items
APPLIER CLIP 5 13 M/L LIGAMAX5 (MISCELLANEOUS)
APPLIER CLIP ROT 10 11.4 M/L (STAPLE)
BAG COUNTER SPONGE SURGICOUNT (BAG) ×2 IMPLANT
BLADE EXTENDED COATED 6.5IN (ELECTRODE) IMPLANT
CANNULA REDUC XI 12-8 STAPL (CANNULA)
CANNULA REDUCER 12-8 DVNC XI (CANNULA) IMPLANT
CELLS DAT CNTRL 66122 CELL SVR (MISCELLANEOUS) IMPLANT
CHLORAPREP W/TINT 26 (MISCELLANEOUS) IMPLANT
CLIP APPLIE 5 13 M/L LIGAMAX5 (MISCELLANEOUS) IMPLANT
CLIP APPLIE ROT 10 11.4 M/L (STAPLE) IMPLANT
COVER SURGICAL LIGHT HANDLE (MISCELLANEOUS) ×4 IMPLANT
COVER TIP SHEARS 8 DVNC (MISCELLANEOUS) ×1 IMPLANT
COVER TIP SHEARS 8MM DA VINCI (MISCELLANEOUS) ×2
DECANTER SPIKE VIAL GLASS SM (MISCELLANEOUS) ×2 IMPLANT
DEVICE TROCAR PUNCTURE CLOSURE (ENDOMECHANICALS) IMPLANT
DRAIN CHANNEL 19F RND (DRAIN) IMPLANT
DRAPE ARM DVNC X/XI (DISPOSABLE) ×4 IMPLANT
DRAPE COLUMN DVNC XI (DISPOSABLE) ×1 IMPLANT
DRAPE DA VINCI XI ARM (DISPOSABLE) ×8
DRAPE DA VINCI XI COLUMN (DISPOSABLE) ×2
DRAPE SURG IRRIG POUCH 19X23 (DRAPES) ×2 IMPLANT
DRSG OPSITE POSTOP 4X10 (GAUZE/BANDAGES/DRESSINGS) IMPLANT
DRSG OPSITE POSTOP 4X6 (GAUZE/BANDAGES/DRESSINGS) ×1 IMPLANT
DRSG OPSITE POSTOP 4X8 (GAUZE/BANDAGES/DRESSINGS) IMPLANT
DRSG TEGADERM 2-3/8X2-3/4 SM (GAUZE/BANDAGES/DRESSINGS) ×6 IMPLANT
DRSG TEGADERM 4X4.75 (GAUZE/BANDAGES/DRESSINGS) IMPLANT
ELECT PENCIL ROCKER SW 15FT (MISCELLANEOUS) ×2 IMPLANT
ELECT REM PT RETURN 15FT ADLT (MISCELLANEOUS) ×2 IMPLANT
ENDOLOOP SUT PDS II  0 18 (SUTURE)
ENDOLOOP SUT PDS II 0 18 (SUTURE) IMPLANT
EVACUATOR SILICONE 100CC (DRAIN) IMPLANT
GAUZE SPONGE 2X2 8PLY STRL LF (GAUZE/BANDAGES/DRESSINGS) ×1 IMPLANT
GLOVE SURG NEOPR MICRO LF SZ8 (GLOVE) ×6 IMPLANT
GLOVE SURG UNDER LTX SZ8 (GLOVE) ×6 IMPLANT
GOWN STRL REUS W/TWL XL LVL3 (GOWN DISPOSABLE) ×6 IMPLANT
GRASPER SUT TROCAR 14GX15 (MISCELLANEOUS) IMPLANT
HOLDER FOLEY CATH W/STRAP (MISCELLANEOUS) ×2 IMPLANT
IRRIG SUCT STRYKERFLOW 2 WTIP (MISCELLANEOUS) ×2
IRRIGATION SUCT STRKRFLW 2 WTP (MISCELLANEOUS) ×1 IMPLANT
KIT PROCEDURE DA VINCI SI (MISCELLANEOUS)
KIT PROCEDURE DVNC SI (MISCELLANEOUS) IMPLANT
KIT SIGMOIDOSCOPE (SET/KITS/TRAYS/PACK) IMPLANT
KIT TURNOVER KIT A (KITS) IMPLANT
NDL INSUFFLATION 14GA 120MM (NEEDLE) ×1 IMPLANT
NEEDLE INSUFFLATION 14GA 120MM (NEEDLE) ×2 IMPLANT
PACK CARDIOVASCULAR III (CUSTOM PROCEDURE TRAY) ×2 IMPLANT
PACK COLON (CUSTOM PROCEDURE TRAY) ×2 IMPLANT
PAD POSITIONING PINK XL (MISCELLANEOUS) ×2 IMPLANT
PROTECTOR NERVE ULNAR (MISCELLANEOUS) ×4 IMPLANT
RELOAD STAPLE 45 3.5 BLU DVNC (STAPLE) IMPLANT
RELOAD STAPLE 45 4.3 GRN DVNC (STAPLE) IMPLANT
RELOAD STAPLE 60 3.5 BLU DVNC (STAPLE) IMPLANT
RELOAD STAPLE 60 4.3 GRN DVNC (STAPLE) IMPLANT
RELOAD STAPLER 3.5X45 BLU DVNC (STAPLE) IMPLANT
RELOAD STAPLER 3.5X60 BLU DVNC (STAPLE) IMPLANT
RELOAD STAPLER 4.3X45 GRN DVNC (STAPLE) IMPLANT
RELOAD STAPLER 4.3X60 GRN DVNC (STAPLE) ×1 IMPLANT
RETRACTOR WND ALEXIS 18 MED (MISCELLANEOUS) IMPLANT
RTRCTR WOUND ALEXIS 18CM MED (MISCELLANEOUS)
SCISSORS LAP 5X35 DISP (ENDOMECHANICALS) ×3 IMPLANT
SEAL CANN UNIV 5-8 DVNC XI (MISCELLANEOUS) ×3 IMPLANT
SEAL XI 5MM-8MM UNIVERSAL (MISCELLANEOUS) ×6
SEALER VESSEL DA VINCI XI (MISCELLANEOUS) ×2
SEALER VESSEL EXT DVNC XI (MISCELLANEOUS) ×1 IMPLANT
SOLUTION ELECTROLUBE (MISCELLANEOUS) ×2 IMPLANT
SPONGE GAUZE 2X2 STER 10/PKG (GAUZE/BANDAGES/DRESSINGS) ×1
STAPLER 45 DA VINCI SURE FORM (STAPLE)
STAPLER 45 SUREFORM DVNC (STAPLE) IMPLANT
STAPLER 60 DA VINCI SURE FORM (STAPLE) ×2
STAPLER 60 SUREFORM DVNC (STAPLE) IMPLANT
STAPLER CANNULA SEAL DVNC XI (STAPLE) ×1 IMPLANT
STAPLER CANNULA SEAL XI (STAPLE) ×2
STAPLER ECHELON POWER CIR 29 (STAPLE) ×1 IMPLANT
STAPLER ECHELON POWER CIR 31 (STAPLE) IMPLANT
STAPLER RELOAD 3.5X45 BLU DVNC (STAPLE)
STAPLER RELOAD 3.5X45 BLUE (STAPLE)
STAPLER RELOAD 3.5X60 BLU DVNC (STAPLE)
STAPLER RELOAD 3.5X60 BLUE (STAPLE)
STAPLER RELOAD 4.3X45 GREEN (STAPLE)
STAPLER RELOAD 4.3X45 GRN DVNC (STAPLE)
STAPLER RELOAD 4.3X60 GREEN (STAPLE) ×2
STAPLER RELOAD 4.3X60 GRN DVNC (STAPLE) ×1
STOPCOCK 4 WAY LG BORE MALE ST (IV SETS) ×4 IMPLANT
SURGILUBE 2OZ TUBE FLIPTOP (MISCELLANEOUS) IMPLANT
SUT MNCRL AB 4-0 PS2 18 (SUTURE) ×2 IMPLANT
SUT PDS AB 1 CT1 27 (SUTURE) ×4 IMPLANT
SUT PROLENE 0 CT 2 (SUTURE) IMPLANT
SUT PROLENE 2 0 KS (SUTURE) IMPLANT
SUT PROLENE 2 0 SH DA (SUTURE) IMPLANT
SUT SILK 2 0 (SUTURE)
SUT SILK 2 0 SH CR/8 (SUTURE) IMPLANT
SUT SILK 2-0 18XBRD TIE 12 (SUTURE) IMPLANT
SUT SILK 3 0 (SUTURE)
SUT SILK 3 0 SH CR/8 (SUTURE) ×2 IMPLANT
SUT SILK 3-0 18XBRD TIE 12 (SUTURE) IMPLANT
SUT V-LOC BARB 180 2/0GR6 GS22 (SUTURE)
SUT VIC AB 3-0 SH 18 (SUTURE) IMPLANT
SUT VIC AB 3-0 SH 27 (SUTURE)
SUT VIC AB 3-0 SH 27XBRD (SUTURE) IMPLANT
SUT VICRYL 0 UR6 27IN ABS (SUTURE) ×2 IMPLANT
SUTURE V-LC BRB 180 2/0GR6GS22 (SUTURE) IMPLANT
SYR 10ML ECCENTRIC (SYRINGE) ×2 IMPLANT
SYS LAPSCP GELPORT 120MM (MISCELLANEOUS)
SYS WOUND ALEXIS 18CM MED (MISCELLANEOUS) ×2
SYSTEM LAPSCP GELPORT 120MM (MISCELLANEOUS) IMPLANT
SYSTEM WOUND ALEXIS 18CM MED (MISCELLANEOUS) ×1 IMPLANT
TOWEL OR NON WOVEN STRL DISP B (DISPOSABLE) ×2 IMPLANT
TRAY FOLEY MTR SLVR 16FR STAT (SET/KITS/TRAYS/PACK) ×2 IMPLANT
TROCAR ADV FIXATION 5X100MM (TROCAR) ×2 IMPLANT
TUBING CONNECTING 10 (TUBING) ×4 IMPLANT
TUBING INSUFFLATION 10FT LAP (TUBING) ×2 IMPLANT

## 2021-05-26 NOTE — Anesthesia Procedure Notes (Addendum)
Arterial Line Insertion Start/End1/31/2023 2:12 PM Performed by: Roderic Palau, MD, [place] Cage, CRNA, CRNA  Patient location: OR. Preanesthetic checklist: patient identified, IV checked, site marked, risks and benefits discussed, surgical consent, monitors and equipment checked, pre-op evaluation, timeout performed and anesthesia consent Patient sedated Left, radial was placed Catheter size: 20 G Hand hygiene performed  and maximum sterile barriers used  Allen's test indicative of satisfactory collateral circulation Attempts: 3 Procedure performed without using ultrasound guided technique. Following insertion, Biopatch and dressing applied. Post procedure assessment: normal  Post procedure complications: second provider assisted. Additional procedure comments: Left radial Aline attempt x 2 by Jefm Miles, CRNA.  Left radial Aline placed by Lerry Paterson, CRNA.  Marland Kitchen

## 2021-05-26 NOTE — Anesthesia Preprocedure Evaluation (Addendum)
Anesthesia Evaluation  Patient identified by MRN, date of birth, ID band Patient awake    Reviewed: Allergy & Precautions, H&P , NPO status , Patient's Chart, lab work & pertinent test results  Airway Mallampati: II  TM Distance: >3 FB Neck ROM: Full    Dental no notable dental hx. (+) Edentulous Upper, Edentulous Lower, Dental Advisory Given   Pulmonary neg pulmonary ROS,    Pulmonary exam normal breath sounds clear to auscultation       Cardiovascular hypertension, Pt. on medications + Valvular Problems/Murmurs MR  Rhythm:Regular Rate:Normal     Neuro/Psych Anxiety Depression negative neurological ROS     GI/Hepatic Neg liver ROS, hiatal hernia, GERD  Medicated,  Endo/Other  Hypothyroidism   Renal/GU Renal InsufficiencyRenal disease  negative genitourinary   Musculoskeletal  (+) Arthritis , Osteoarthritis,    Abdominal   Peds  Hematology  (+) Blood dyscrasia, anemia ,   Anesthesia Other Findings   Reproductive/Obstetrics negative OB ROS                            Anesthesia Physical Anesthesia Plan  ASA: 3  Anesthesia Plan: General   Post-op Pain Management:    Induction: Intravenous  PONV Risk Score and Plan: 4 or greater and Ondansetron, Dexamethasone and Treatment may vary due to age or medical condition  Airway Management Planned: Oral ETT  Additional Equipment: Arterial line  Intra-op Plan:   Post-operative Plan: Extubation in OR  Informed Consent: I have reviewed the patients History and Physical, chart, labs and discussed the procedure including the risks, benefits and alternatives for the proposed anesthesia with the patient or authorized representative who has indicated his/her understanding and acceptance.     Dental advisory given  Plan Discussed with: CRNA  Anesthesia Plan Comments:        Anesthesia Quick Evaluation

## 2021-05-26 NOTE — H&P (Signed)
05/26/2021     REFERRING PHYSICIAN: [name], MD  Patient Care Team: [name], MD as PCP - General (Family Medicine) Johney Maine, Adrian Saran, MD as Consulting Provider (General Surgery) Lin Landsman, MD (Gastroenterology) Agbor-Etang, Donnie Mesa, MD (Cardiovascular Disease)  PROVIDER: Hollace Kinnier, MD  [facility] MRN: [id number] DOB: Oct 01, 1930 DATE OF ENCOUNTER: 05/12/2021  SUBJECTIVE   Chief Complaint: Rectal prolapse   History of Present Illness: Brooke Beasley is a 88 y.o. female who is seen today  as an office consultation at the request of Dr. Danise Mina  for evaluation of Rectal prolapse .   Pleasant elderly but active woman here with her daughter with whom she lives with. She has a history of diverticulosis. She notes she has had chronic constipation for most of her life. Has been on fiber bowel supplements. This past year she has noticed worsening rectal bleeding. She has been followed by gastroenterology. She has had colonoscopies for many decades. Mostly showing left-sided diverticulosis. Recently she has been followed through [place] = Dr Marius Ditch. Had a colonoscopy in 2021 that showed a serrated polyp and some diverticulosis but was mostly underwhelming. Patient had did have an episode of C. difficile colitis that was rather severe a few years ago but no recurrence in the past year.   Patient has known hemorrhoids. She has had banding done by Dr. Marius Ditch twice in the summertime. She had recurrent episodes of rectal bleeding. Felt lightheaded and fell. Was admitted and transfused. Facial fractures. This was at [facility]. She underwent flexible sigmoidoscopy that disproved any active diverticular bleed. Suspected irritated hemorrhoids. I think she had another banding done in December. However patient notes that things are prolapsing out rather regularly. She now has episodes of incontinence to flatus. She has to wear diapers. She has had episodes of  accidents where she does not know that she is moving her bowels and defecates into her diapers. It has been frustrating. She will note that she occasionally gets some crampy abdominal pain before it happens. She is hesitant to leave the house now. She still tends to be rather constipated. He is currently taking Benefiber, Colace, and MiraLAX. Patient's daughter confirms that tissue has been prolapsing out.  Patient has never smoked. No cardiac or pulmonary issues that she is aware of. Followed by Dr. Danise Mina. With her near syncopal event this fall, she had an echocardiogram around Thanksgiving which showed normal ejection fraction without any valvular issues. Patient normally walks without much difficulty but has used a roller and a cane at home with her recent fall to be safe. Normally can walk several blocks without much difficulty. No diabetes. No sleep apnea. She is never had any abdominal surgery. I do not sense any major stress urinary incontinence.  Medical History:  Past Medical History:  Diagnosis Date   Age related osteoporosis   Anxiety associated with depression   Arthritis   Carotid stent occlusion (CMS-HCC)   CKD (chronic kidney disease)   GERD (gastroesophageal reflux disease)   Glaucoma   Heart murmur, unspecified   Hemorrhoids   Hypertension   Hypothyroidism   IBS (irritable bowel syndrome)   Insomnia   Prediabetes   Restless leg syndrome   Syncope   Patient Active Problem List  Diagnosis   Rotator cuff tendinitis, left   Primary osteoarthritis of right shoulder   Pseudogout of shoulder, right   Status post total replacement of left shoulder   Primary osteoarthritis of first carpometacarpal joint of left hand   Thumb  pain, left   Complete tear of left rotator cuff   Constipation, chronic   Rectal prolapse   Bleeding hemorrhoids   Diverticular disease of left colon   Acute blood loss anemia   Bilateral hearing loss   Past Surgical History:  Procedure  Laterality Date   left total shoulder arthroplasty Left 11/27/2015  Dr.Poggi   Prmary repair of subscapularis tendon with humeral head exchange status post total should arthroplasty, left shoulder Left 03/16/2016  Dr.Poggi   COLONOSCOPY   UPPER GASTROINTESTINAL ENDOSCOPY    Allergies  Allergen Reactions   Amlodipine Other (See Comments)  Stomach pains--"feel like my insides are on fire"   Prednisone Anaphylaxis   Ace Inhibitors Rash  REACTION: cough   Alendronate Sodium Cough  REACTION: palpitations   Carvedilol Other (See Comments)  fatigue   Doxepin Other (See Comments)  Hallucinations and racing heart   Losartan Diarrhea and Other (See Comments)  Cr bumped   Metoprolol Diarrhea  Diarrhea and weakness Diarrhea and weakness   Other Other (See Comments)  No seeds or corn because of IBS   Paroxetine Hcl Other (See Comments)  Not effective - felt ill on this medicine   Risedronate Sodium Unknown  REACTION: joint pain   Simvastatin Unknown  REACTION: the full 20 mg pill causes leg pain- can tol 10 mg   Sulfa (Sulfonamide Antibiotics) Rash  REACTION: rash   Sulfasalazine Rash  REACTION: rash   Tramadol Other (See Comments)  Felt like passing out Felt like passing out   Influenza Virus Vaccines Rash and Other (See Comments)  Led to mental breakdown in 61s Led to mental breakdown in 1960s   Current Outpatient Medications on File Prior to Visit  Medication Sig Dispense Refill   acetaminophen (TYLENOL) 500 MG tablet Take by mouth as needed Take 1,000 mg by mouth 2 (two) times daily   ascorbic acid (VITAMIN C ORAL) Take by mouth once daily   BENEFIBER, WHEAT DEXTRIN, ORAL Take by mouth once daily   bimatoprost (LUMIGAN) 0.01 % ophthalmic solution 1 drop at bedtime   clonazePAM (KLONOPIN) 0.5 MG tablet Take 0.5 mg by mouth at bedtime   colchicine (COLCRYS) 0.6 mg tablet Take by mouth as needed   diclofenac (VOLTAREN) 1 % topical gel Apply 2 g topically 4 (four) times  daily   docusate (COLACE) 100 MG capsule Take 100 mg by mouth once daily   DULCOLAX, BISACODYL, ORAL Take by mouth once daily   ferrous sulfate (IRON ORAL) Take by mouth once daily   glucosamine/chondr su A sod (OSTEO BI-FLEX ORAL) Take by mouth once daily   hydrOXYchloroQUINE (PLAQUENIL) 200 mg tablet Take 1 tablet (200 mg total) by mouth once daily 30 tablet 5   inulin/sorbitol (FIBER CHOICE ORAL) Take by mouth once daily   irbesartan (AVAPRO) 150 MG tablet Take 150 mg by mouth once daily   levothyroxine (SYNTHROID) 100 MCG tablet Take 100 mcg by mouth once daily Take on an empty stomach with a glass of water at least 30-60 minutes before breakfast.   multivitamin tablet Take 1 tablet by mouth once daily   OMEPRAZOLE ORAL Take by mouth once daily   polyethylene glycol (MIRALAX) powder Take 17 g by mouth once daily Mix in 4-8ounces of fluid prior to taking.   sertraline (ZOLOFT) 100 MG tablet Take 100 mg by mouth once daily   simethicone 180 mg Cap Take by mouth   spironolactone (ALDACTONE) 25 MG tablet Take 25 mg by mouth once daily  traMADoL (ULTRAM) 50 mg tablet Take by mouth as needed   vit A/vit C/vit E/zinc/copper (PRESERVISION AREDS ORAL) Take by mouth   No current facility-administered medications on file prior to visit.   Family History  Problem Relation Age of Onset   Arthritis Mother   High blood pressure (Hypertension) Father   Coronary Artery Disease (Blocked arteries around heart) Father   Stroke Father    Social History   Tobacco Use  Smoking Status Never  Smokeless Tobacco Never    Social History   Socioeconomic History   Marital status: Married  Occupational History   Occupation: Retired  Tobacco Use   Smoking status: Never   Smokeless tobacco: Never  Scientific laboratory technician Use: Never used  Substance and Sexual Activity   Alcohol use: No   Drug use: No   Sexual activity: Defer   ############################################################  Review  of Systems: A complete review of systems (ROS) was obtained from the patient. I have reviewed this information and discussed as appropriate with the patient. See HPI as well for other pertinent ROS.  Constitutional: No fevers, chills, sweats. Weight stable Eyes: No vision changes, No discharge HENT: No sore throats, nasal drainage Lymph: No neck swelling, No bruising easily Pulmonary: No cough, productive sputum CV: No orthopnea, PND Patient walks 15 minutes for about 1/4 miles without difficulty. No exertional chest/neck/shoulder/arm pain.  GI: No personal nor family history of GI/colon cancer, inflammatory bowel disease, irritable bowel syndrome, allergy such as Celiac Sprue, dietary/dairy problems, colitis, ulcers nor gastritis. No recent sick contacts/gastroenteritis. No travel outside the country. No changes in diet.  Renal: No UTIs, No hematuria Genital: No drainage, bleeding, masses Musculoskeletal: No severe joint pain. Good ROM major joints Skin: No sores or lesions Heme/Lymph: No easy bleeding. No swollen lymph nodes  OBJECTIVE   Vitals:  05/12/21 0915  Pulse: 76  Weight: 71.6 kg (157 lb 12.8 oz)  Height: 154.9 cm (5\' 1" )    Body mass index is 29.82 kg/m.  PHYSICAL EXAM:  Constitutional: Not cachectic. Hygeine adequate. Vitals signs as above.  Eyes: Pupils reactive, normal extraocular movements. Sclera nonicteric Neuro: CN II-XII intact. No major focal sensory defects. No major motor deficits. Lymph: No head/neck/groin lymphadenopathy Psych: No severe agitation. No severe anxiety. Judgment & insight Adequate, Oriented x4, HENT: Normocephalic, Mucus membranes moist. No thrush. Hearing: adequate Neck: Supple, No tracheal deviation. No obvious thyromegaly Chest: No pain to chest wall compression. Good respiratory excursion. No audible wheezing CV: Pulses intact. Regular rhythm. No major extremity edema Ext: No obvious deformity or contracture. Edema: Not present. No  cyanosis Skin: No major subcutaneous nodules. Warm and dry Musculoskeletal: Severe joint rigidity not present. No obvious clubbing. No digital petechiae. Mobility: no assist device moving easily without restrictions  Abdomen:  Obese  Soft. Mildly distended. Nontender. Hernia: Not present. Diastasis recti: Mild supraumbilical midline. No hepatomegaly. No splenomegaly.  Genital/Pelvic: Inguinal hernia: Not present. Inguinal lymph nodes: without lymphadenopathy nor hidradenitis.   Rectal: Perianal skin with mild pruritus. Sphincter tone intact but decreased. Upon Valsalva she has near circumferential rectal prolapse with 5 cm coming out. Irritated right anterior and left lateral hemorrhoids especially. Stretched out rectum internally prolapses as well. Thinned out but intact rectovaginal septum with perhaps mild rectocele.  No fissure. No fistula. No abscess. I palpate no rectal masses. There is no active blood.    ###################################################################  Labs, Imaging and Diagnostic Testing:  Located in [place]' section of Epic EMR chart  [facility]  CLINIC NOTES:  Not applicable  SURGERY NOTES:  Not applicable  PATHOLOGY:  Located in [place]' section of Epic EMR chart  Assessment and Plan:  DIAGNOSES:  Diagnoses and all orders for this visit:  Rectal prolapse  Constipation, chronic  Bleeding hemorrhoids  Diverticular disease of left colon  Acute blood loss anemia  Bilateral hearing loss, unspecified hearing loss type  Other orders - polyethylene glycol (MIRALAX) powder; Take 233.75 g by mouth once for 1 dose Take according to your procedure prep instructions. - bisacodyL (DULCOLAX) 5 mg EC tablet; Take 4 tablets (20 mg total) by mouth once daily as needed for Constipation for up to 1 dose - metroNIDAZOLE (FLAGYL) 500 MG tablet; Take 2 tablets (1,000 mg total) by mouth 3 (three) times daily for 3 doses SEE BOWEL PREP  INSTRUCTIONS: Take 2 tablets at 2pm, 3pm, and 10pm the day prior to your colon operation. - neomycin 500 mg tablet; Take 2 tablets (1,000 mg total) by mouth 3 (three) times daily for 3 doses SEE BOWEL PREP INSTRUCTIONS: Take 2 tablets at 2pm, 3pm, and 10pm the day prior to your colon operation.    ASSESSMENT/PLAN  Pleasant elderly woman struggling with chronic constipation with recurrent episodes of bleeding. Significant anemia to have near syncopal events and admission and transfusion.  She has had an underwhelming colonoscopy in 2021 with a few small serrated polyps removed. Flexible sigmoidoscopy a few months ago showed no active diverticular bleeding inflamed distal rectum/hemorrhoidal etiology.  Today she has definite complete circumferential rectal prolapse several inches with irritated hemorrhoids as a result.  I think this is more than trying to do some suturing. I think this will require rectopexy. Looking at her CAT scan she has very redundant colon. With her intermittent cramping I wonder if she is twisting and having problems there as well, but I see no definite evidence of volvulus on prior films. Ideally I would do rectosigmoid resection to have a much more normal length with suture rectopexy. Robotic approach. I would tend to be aggressive since she otherwise is very active with decent performance status and should be able to tolerate that despite her advanced age. Do interoperative firefly. She is interested in proceeding.  The anatomy & physiology of the digestive tract was discussed.  The pathophysiology of rectal prolapse was discussed.  Natural history risks without surgery was discussed.   I feel the risks of no intervention will lead to serious problems that outweigh the operative risks; therefore, I recommended surgery to treat the pathology.  Possible need for sigmoid colectomy to remove redundant colon was discussed.  Pexy by suture and probable mesh reinforcement was  discussed as well.  Laparoscopic & open techniques were discussed.   Risks such as bleeding, infection, abscess, leak, reoperation, possible ostomy, hernia, stroke, heart attack, death, and other risks were discussed.   I noted a good likelihood this will help address the problem.  Goals of post-operative recovery were discussed as well.  We will work to minimize complications.  An educational handout on the technique was given as well.  Questions were answered.  The patient expresses understanding & wishes to proceed with surgery.    She occasionally uses a walker or cane for balance since she had that fall episode a few months ago. She has had no more falls since but did have a near syncopal event this weekend when she noticed bleeding again. Otherwise okay. Has a decent echocardiogram November 2022 with no valvular problems and ejection fraction  60 to X50 5%. Followed by cardiology in [place]. Would like to double check cardiac clearance. The daughter notes that both primary care and cardiology are aware of patient needing surgery and verbally cleared. Would like to double check the paper.  Noted that most of her problems are a side effect of chronic constipation. I recommended stopping all 3 things and just focus on 1 treatment for her constipation for now. Consider just doing MiraLAX and stop the Benefiber/Colace. Increase that so she is moving her bowels every day. Most likely we can recalibrate this after surgery and see what her new baseline is.  She is worried about the hemorrhoids. I might consider suturing that at the time of surgery but I do not want to be super aggressive since I am already doing a resection rectopexy and most likely her hemorrhoids will calm down when she is no longer circumferentially prolapsing. She has had banding done 3 times in the past year, so I am skeptical trying to band again would help. They seem somewhat fibrotic & friable at this point    Adin Hector,  MD, FACS, MASCRS Esophageal, Gastrointestinal & Colorectal Surgery Robotic and Minimally Invasive Surgery  [facility], [place]  [place]. [address], [place], [place] [address] [phone] Fax [phone] Main  CONTACT INFORMATION:  Weekday (9AM-5PM): Call [facility] main office at [phone]  Weeknight (5PM-9AM) or Weekend/Holiday: Check [email or web address] (password " [email or web address]") for General Surgery [facility] coverage  (Please, do not use SecureChat as it is not reliable communication to operating surgeons for immediate patient care)    05/26/2021

## 2021-05-26 NOTE — Op Note (Addendum)
05/26/2021  5:22 PM  PATIENT:  Brooke Beasley  88 y.o. female  Patient Care Team: [name], MD as PCP - General (Family Medicine) Kate Sable, MD as PCP - Cardiology (Cardiology) Dannielle Karvonen, RN as [facility] Kelseigh Diver, Remo Lipps, MD as Consulting Physician (General Surgery) Lin Landsman, MD as Consulting Physician (Gastroenterology) Kate Sable, MD as Consulting Physician (Cardiology)  PRE-OPERATIVE DIAGNOSIS:  RECTAL PROLAPSE  POST-OPERATIVE DIAGNOSIS: RECTAL PROLAPSE  PROCEDURE:  -ROBOTIC LOW ANTERIOR RECTOSIGMOID RESECTION -RECTOPEXY -INTRAOPERATIVE ASSESSMENT OF TISSUE VASCULAR PERFUSION USING ICG (indocyanine green) IMMUNOFLUORESCENCE -TRANSVERSUS ABDOMINIS PLANE (TAP) BLOCK - BILATERAL -RIGID PROCTOSCOPY -ANORECTAL EUA  SURGEON:  Adin Hector, MD  ASSISTANT: Greer Pickerel, MD An experienced assistant was required given the standard of surgical care given the complexity of the case.  This assistant was needed for exposure, dissection, suction, tissue approximation, retraction, perception, etc.  ANESTHESIA:   local and general  TRANSVERSUS ABDOMINIS PLANE (TAP) BLOCK - BILATERAL Nerve block provided with liposomal bupivacaine (Experel) mixed with 0.25% bupivacaine as a Bilateral TAP block x 73mL each side at the level of the transverse abdominis & preperitoneal spaces along the flank at the anterior axillary line, from subcostal ridge to iliac crest under laparoscopic guidance    EBL:  Total I/O In: 1000 [I.V.:1000] Out: 700 [Urine:600; Blood:100]  Delay start of Pharmacological VTE agent (>24hrs) due to surgical blood loss or risk of bleeding:  no  DRAINS: 19 Fr Blake drain goes down to posterior & anterior pelvis  SPECIMEN:  RECTOSIGMOID COLON WITH DISTAL ANASTOMOTIC RING  DISPOSITION OF SPECIMEN:  PATHOLOGY  COUNTS:  YES  PLAN OF CARE: Admit to inpatient   PATIENT DISPOSITION:  PACU -  hemodynamically stable.  INDICATION:    Pleasant elderly but active woman whose had worsening episodes of tissue prolapse out her anus.  Now to the point of having complete prolapse out.  Having to wear diapers with incontinence and discomfort.  Irritated hemorrhoids.  Frustrating.  I recommended robotic dissection with rectopexy.  Possible resection of redundant colon since she seems to have extra colon and has significant constipation  The anatomy & physiology of the digestive tract was discussed.  The pathophysiology of rectal prolapse was discussed.  Natural history risks without surgery was discussed.   I feel the risks of no intervention will lead to serious problems that outweigh the operative risks; therefore, I recommended surgery to treat the pathology.  Possible need for sigmoid colectomy to remove redundant colon was discussed.  Pexy by suture and probable mesh reinforcement was discussed as well.  Laparoscopic & open techniques were discussed.   Risks such as bleeding, infection, abscess, leak, reoperation, possible ostomy, hernia, stroke, heart attack, death, and other risks were discussed.   I noted a good likelihood this will help address the problem.  Goals of post-operative recovery were discussed as well.  We will work to minimize complications.  An educational handout on the technique was given as well.  Questions were answered.  The patient and her daughter expressed understanding and wished to proceed with surgery.   OR FINDINGS: Very redundant sigmoid colon.  Circumferential prolapse of distal and mid rectum circumferentially with irritated hemorrhoids.  About 45 cm redundant colon resected from mid descending to proximal rectum.  Suture rectopexy done.  Patient has a 80 EEA end mid descending colon-to-end mid rectum stapled anastomosis.   At 10 cm from the anal verge.  Irritated internal hemorrhoids but grade 1-2 after prolapse corrected.  Held off on more aggressive  resection/ligation/pexy.  Suspect hemorrhoid bleeding from chronic prolapse irritation.  CASE DATA:  Type of patient?: Elective [facility] Private Case  Status of Case? Elective Scheduled  Infection Present At Time Of Surgery (PATOS)?  NO  PROCEDURE:  Informed consent was confirmed.  Patient underwent general anesthesia without any difficulty & was positioned in low lithotomy with arms tucked.  The patient had a Foley catheter sterilely placed.  The abdomen and perineum were prepped and draped in sterile fashion.  Surgical time-out confirmed our plan.   I placed a 8-mm robotic port in the left upper quadrant using Varess technique with the patient in steep reverse Trendelenburg and left sideup.  Entry was clean.  We induced carbon dioxide insufflation.  Port placed.  Extra ports placed carefully under direct visualization. A few omental adhesions were noted in the upper central abdomen to the floppy falciform ligament. These were left in place as they were not causing major issues.  We positioned the patient head down and evacuated the greater omentum and the upper abdomen.  Went ahead and docked the [employer] carefully.  I turned attention to the pelvis.  Patient had a very redundant stretched out sigmoid colon reaching up to the right upper quadrant.  I felt resection would be appropriate given her constipation and very redundant tissue.  Worried rectopexy might result in volvulus since it was very tortuous and floppy.  Partially twisted already.  I elevated the redundant rectosigmoid colon.  I scored the peritoneum at the rectosigmoid region at the level of the sacral promontory and continued scoring of peritoneum down to the right peritoneal reflection.  I left the rectosigmoid mesentery anteriorly and got into the presacral plane.  I followed blunt dissection down the sacrum and down towards the levators for good posterior rectal mobilzation.    I dissected more proximally.  I elevated the sigmoid  mesentery to identify the retroperitoneum and in the presacral plane.  Rectosigmoid colon was rather adherent to the left side of the pelvis.  I identified the left ureter, kept that posteriorly in the retroperitioneum.  I identified the left gonadals and kept those posterior as well.  I tried to avoid any more dissection more proximally to avoid disturbing any further retroperitoneal and left upper quadrant lateral adhesions of the left colon to the left pericolic gutter.   I then took some of the peritoneal reflection on the left rectosigmoid side going up the pelvic brim to the low anterior reflection. Freed the visceral peritoneum off the right rectum as well to connect with the peritoneal reflection.  I then came around anteriorly to help mobilize the anterior rectum off its anterior attachments to the rectovaginal septum all the way down to the pelvic floor for good anterior rectal mobilization.  I went back to mobilizing the posterior mesorectum off the presacral space. Freed off the posterior midline anococcygeal ligament gradually elevated and was able to reduce the chronic rectal prolapse back in to the pelvis.  Terminated between anterior and posterior dissection, sparing the lateral pedicles. .  With that, I could get the mid rectum to stretch up to the sacral promontory to help straighten out the stretched out rectum. I did digital rectal exam to confirm that I had good dissection anteriorly and posteriorly down to the sphincter complex with good mobilization. We had worked to spare  lateral blood supply.  I made an effort to keep the lateral pedicles intact on both sides at mid and  distal rectum.  I created a window between the proximal and mid rectal regions where it reached to the sacral promontory under some mild tension.  I transected the mesorectum at the rectosigmoid junction and peeled the mesorectum down to the mid rectum to ensure good blood supply. Then brought down to the left colon and  chose a region in the mid descending colon that could reach down to the sacral promontory well.  I transected the left colon mesentery in a radial fashion preserving the main IMA pedicle and a dominant left colic branch.  Left the IMV as well.  Wanted to have some retroperitoneal pedicles to help hold the left colon up  Given her advanced age and decide to make sure that tissue perfusion was good to access vascular perfusion of tissues, we asked anesthesia use intravenous  indocyanine green (ICG) with IV flush.  I switched to the NIR fluorescence (Firefly mode) imaging window on the daVinci robot platform.  We were able to see good light green visualization of blood vessels with good vascular perfusion of tissues, confirming good tissue perfusion of tissues  planned for anastomosis (mid descending colon and mid rectum).  I transected at the mid rectum with a green load firing robotic stapler. 100% first stapling.   I then used 0 Prolene suture through the left lateral sacral promontory and did 3 ties.  Brought the suture through the left posterior rectal stump. Did a similar mirror-image stitch on the right side of the sacral promontory to the right posterior rectal stump.   I created an extraction incision through a small Pfannenstiel incision in the suprapubic region where the stapler port had been placed.  Placed a wound protector.  I was able to eviscerate the rectosigmoid colon out the wound.   I clamped the colon proximal to this area using a reusable pursestringer device.  Passed a 2-0 Keith needle. I transected at the descending/sigmoid junction with a scalpel. I got healthy bleeding mucosa.  We sent the rectosigmoid colon specimen off to go to pathology.  We sized the colon orifice.  I chose a 29 EEA anvil stapler system.  Reinforced the prolene pursestring it with interrupted silk suture like a belt loops around the pursestring.  Hemostasis was good. We returned to the colon with the anvil back into  the abdomen and placed a wound protector. The robot was redocked. Hemostasis was good in the abdomen and pelvis. The distal end of the remaining colon reached down to the rectal stump.       Dr Redmond Pulling  scrubbed down and did gentle anal dilation and advanced the EEA stapler up the rectal stump. The spike was brought out at the provimal end of the rectal stump under direct visualization.  I  attached the anvil of the proximal colon the spike of the stapler. Anvil was tightened down and held clamped for 60 seconds. The EEA stapler was fired and held clamped for 30 seconds. The stapler was released & removed. We noted 2 excellent anastomotic rings. Blue stitch is in the proximal ring.   Dr Redmond Pulling did rigid proctoscopy noted the anastomosis was at 10 cm from the anal verge consistent with the proximal rectum.  We irrigation of isotonic solution & held that for the pelvic air leak test .  The rectum was insufflated the rectum while clamping the colon proximal to that anastomosis.  There was a negative air leak test. There was no tension of mesentery or bowel at the anastomosis.  Tissues looked viable.  Ureters & bowel uninjured.  The anastomosis looked healthy.   I tied down the pair of 0 Prolene stitches to help do the posterolateral rectopexy in the mid rectum to the sacral promontory. I then used 2-0 V lock and ran on the distal lateral pelvis peritoneal reflection and ran proximally up the left paracolic gutter. Did another stitch on the right side as well.    Placed a drain through a side-port into the presacral space down to the pelvic floor.  Ran the right V-Loc suture more proximally to the base of the inferior mesenteric artery and vein.  This resulted in a thus anterolateral colorectopexy right and left.Secured with 2-0 Prolene suture.  We inspected the abdomen and pelvis cavity..  Hemostasis was good.   Endoluminal gas was evacuated.  Ports & wound protector removed.  We changed gloves & redraped the  patient per colon SSI prevention protocol.  We aspirated the antibiotic irrigation.  Hemostasis was good.  Sterile unused instruments were used from this point.  I closed the skin at the port sites using Monocryl stitch and sterile dressing.  I closed the extraction wound using a 0 Vicryl vertical peritoneal closure and a #1 PDS transverse anterior rectal fascial closure like a small Pfannenstiel closure. I closed the skin with some interrupted Monocryl stitches.  I placed sterile dressings.     I went down and did anorectal examination under anesthesia.  Her sphincter tone was somewhat decreased as suspected but there was no prolapsing hemorrhoids.  They felt like they were grade 1/grade 2.  No more prolapsing tissue.  No more rectal prolapse.  No external hemorrhoids.  No fissure or fistula nor abscess.  I decided to hold off on any suturing or anything more aggressive on the hemorrhoid pile since her hemorrhoid anatomy only looked mildly enlarged.  I did not want to compromise any blood supply was from the rectal stump only.  Patient is being extubated go to recovery room. I had discussed postop care with the patient in detail the office & in the holding area. Instructions are written. I discussed operative findings, updated the patient's status, discussed probable steps to recovery, and gave postoperative recommendations to the patient's daughter, Antoine Primas .  Recommendations were made.  Questions were answered.  She expressed understanding & appreciation.   Adin Hector, M.D., F.A.C.S. Gastrointestinal and Minimally Invasive Surgery [facility] [address] [place], [place] [id number] [phone] Main / Paging

## 2021-05-26 NOTE — Anesthesia Postprocedure Evaluation (Signed)
Anesthesia Post Note  Patient: Brooke Beasley  Procedure(s) Performed: ROBOTIC LOW ANTERIOR RECTOSIGMOID RESECTION; ASSESSMENT OF TISSUE PERFUSION WITH FIREFLY RECTOPEXY RIGID PROCTOSCOPY     Patient location during evaluation: PACU Anesthesia Type: General Level of consciousness: awake and alert Pain management: pain level controlled Vital Signs Assessment: post-procedure vital signs reviewed and stable Respiratory status: spontaneous breathing, nonlabored ventilation, respiratory function stable and patient connected to nasal cannula oxygen Cardiovascular status: blood pressure returned to baseline and stable Postop Assessment: no apparent nausea or vomiting Anesthetic complications: no   No notable events documented.  Last Vitals:  Vitals:   05/26/21 1814 05/26/21 1830  BP:  112/64  Pulse: (!) 57 (!) 59  Resp: (!) 9 10  Temp:    SpO2: 94% 96%    Last Pain:  Vitals:   05/26/21 1742  PainSc: (P) Asleep                 Aryanah Enslow,W. EDMOND

## 2021-05-26 NOTE — Transfer of Care (Signed)
Immediate Anesthesia Transfer of Care Note  Patient: Brooke Beasley  Procedure(s) Performed: ROBOTIC LOW ANTERIOR RECTOSIGMOID RESECTION; ASSESSMENT OF TISSUE PERFUSION WITH FIREFLY RECTOPEXY RIGID PROCTOSCOPY  Patient Location: PACU  Anesthesia Type:General  Level of Consciousness: awake, alert  and patient cooperative  Airway & Oxygen Therapy: Patient Spontanous Breathing and Patient connected to face mask oxygen  Post-op Assessment: Report given to RN and Post -op Vital signs reviewed and stable  Post vital signs: Reviewed and stable  Last Vitals:  Vitals Value Taken Time  BP 95/54 05/26/21 1742  Temp    Pulse 59 05/26/21 1744  Resp 13 05/26/21 1744  SpO2 97 % 05/26/21 1744  Vitals shown include unvalidated device data.  Last Pain:  Vitals:   05/26/21 1127  PainSc: 0-No pain         Complications: No notable events documented.

## 2021-05-26 NOTE — Anesthesia Procedure Notes (Signed)
Procedure Name: Intubation Date/Time: 05/26/2021 1:56 PM Performed by: Eben Burow, CRNA Pre-anesthesia Checklist: Patient identified, Emergency Drugs available, Suction available, Patient being monitored and Timeout performed Patient Re-evaluated:Patient Re-evaluated prior to induction Oxygen Delivery Method: Circle system utilized Preoxygenation: Pre-oxygenation with 100% oxygen Induction Type: IV induction Ventilation: Mask ventilation without difficulty Laryngoscope Size: Mac and 4 Grade View: Grade I Tube type: Oral Tube size: 7.0 mm Number of attempts: 1 Airway Equipment and Method: Stylet Placement Confirmation: ETT inserted through vocal cords under direct vision, positive ETCO2 and breath sounds checked- equal and bilateral Secured at: 20 cm Tube secured with: Tape Dental Injury: Teeth and Oropharynx as per pre-operative assessment

## 2021-05-26 NOTE — Discharge Instructions (Signed)
SURGERY: POST OP INSTRUCTIONS (Surgery for small bowel obstruction, colon resection, etc)   ######################################################################  EAT Gradually transition to a high fiber diet with a fiber supplement over the next few days after discharge  WALK Walk an hour a day.  Control your pain to do that.    CONTROL PAIN Control pain so that you can walk, sleep, tolerate sneezing/coughing, go up/down stairs.  HAVE A BOWEL MOVEMENT DAILY Keep your bowels regular to avoid problems.  OK to try a laxative to override constipation.  OK to use an antidairrheal to slow down diarrhea.  Call if not better after 2 tries  CALL IF YOU HAVE PROBLEMS/CONCERNS Call if you are still struggling despite following these instructions. Call if you have concerns not answered by these instructions  ######################################################################   DIET Follow a light diet the first few days at home.  Start with a bland diet such as soups, liquids, starchy foods, low fat foods, etc.  If you feel full, bloated, or constipated, stay on a ful liquid or pureed/blenderized diet for a few days until you feel better and no longer constipated. Be sure to drink plenty of fluids every day to avoid getting dehydrated (feeling dizzy, not urinating, etc.). Gradually add a fiber supplement to your diet over the next week.  Gradually get back to a regular solid diet.  Avoid fast food or heavy meals the first week as you are more likely to get nauseated. It is expected for your digestive tract to need a few months to get back to normal.  It is common for your bowel movements and stools to be irregular.  You will have occasional bloating and cramping that should eventually fade away.  Until you are eating solid food normally, off all pain medications, and back to regular activities; your bowels will not be normal. Focus on eating a low-fat, high fiber diet the rest of your life  (See Getting to [place], below).  CARE of your INCISION or WOUND It is good for closed incision and even open wounds to be washed every day.  Shower every day.  Short baths are fine.  Wash the incisions and wounds clean with soap & water.     If you have a closed incision(s), wash the incision with soap & water every day.  You may leave closed incisions open to air if it is dry.   You may cover the incision with clean gauze & replace it after your daily shower for comfort.  It is good for closed incisions and even open wounds to be washed every day.  Shower every day.  Short baths are fine.  Wash the incisions and wounds clean with soap & water.    You may leave closed incisions open to air if it is dry.   You may cover the incision with clean gauze & replace it after your daily shower for comfort.  TEGADERM:  You have clear gauze band-aid dressings over your closed incision(s).  Remove the dressings 3 days after surgery.   If you have an open wound with a wound vac, see wound vac care instructions.     ACTIVITIES as tolerated Start light daily activities --- self-care, walking, climbing stairs-- beginning the day after surgery.  Gradually increase activities as tolerated.  Control your pain to be active.  Stop when you are tired.  Ideally, walk several times a day, eventually an hour a day.   Most people are back to most day-to-day activities in  a few weeks.  It takes 4-8 weeks to get back to unrestricted, intense activity. If you can walk 30 minutes without difficulty, it is safe to try more intense activity such as jogging, treadmill, bicycling, low-impact aerobics, swimming, etc. Save the most intensive and strenuous activity for last (Usually 4-8 weeks after surgery) such as sit-ups, heavy lifting, contact sports, etc.  Refrain from any intense heavy lifting or straining until you are off narcotics for pain control.  You will have off days, but things should improve  week-by-week. DO NOT PUSH THROUGH PAIN.  Let pain be your guide: If it hurts to do something, don't do it.  Pain is your body warning you to avoid that activity for another week until the pain goes down. You may drive when you are no longer taking narcotic prescription pain medication, you can comfortably wear a seatbelt, and you can safely make sudden turns/stops to protect yourself without hesitating due to pain. You may have sexual intercourse when it is comfortable. If it hurts to do something, stop.  MEDICATIONS Take your usually prescribed home medications unless otherwise directed.   Blood thinners:  Usually you can restart any strong blood thinners after the second postoperative day.  It is OK to take aspirin right away.     If you are on strong blood thinners (warfarin/Coumadin, Plavix, Xerelto, Eliquis, Pradaxa, etc), discuss with your surgeon, medicine PCP, and/or cardiologist for instructions on when to restart the blood thinner & if blood monitoring is needed (PT/INR blood check, etc).     PAIN CONTROL Pain after surgery or related to activity is often due to strain/injury to muscle, tendon, nerves and/or incisions.  This pain is usually short-term and will improve in a few months.  To help speed the process of healing and to get back to regular activity more quickly, DO THE FOLLOWING THINGS TOGETHER: Increase activity gradually.  DO NOT PUSH THROUGH PAIN Use Ice and/or Heat Try Gentle Massage and/or Stretching Take over the counter pain medication Take Narcotic prescription pain medication for more severe pain  Good pain control = faster recovery.  It is better to take more medicine to be more active than to stay in bed all day to avoid medications.  Increase activity gradually Avoid heavy lifting at first, then increase to lifting as tolerated over the next 6 weeks. Do not push through the pain.  Listen to your body and avoid positions and maneuvers than reproduce the pain.   Wait a few days before trying something more intense Walking an hour a day is encouraged to help your body recover faster and more safely.  Start slowly and stop when getting sore.  If you can walk 30 minutes without stopping or pain, you can try more intense activity (running, jogging, aerobics, cycling, swimming, treadmill, sex, sports, weightlifting, etc.) Remember: If it hurts to do it, then dont do it! Use Ice and/or Heat You will have swelling and bruising around the incisions.  This will take several weeks to resolve. Ice packs or heating pads (6-8 times a day, 30-60 minutes at a time) will help sooth soreness & bruising. Some people prefer to use ice alone, heat alone, or alternate between ice & heat.  Experiment and see what works best for you.  Consider trying ice for the first few days to help decrease swelling and bruising; then, switch to heat to help relax sore spots and speed recovery. Shower every day.  Short baths are fine.  It feels good!  Keep the incisions and wounds clean with soap & water.   Try Gentle Massage and/or Stretching Massage at the area of pain many times a day Stop if you feel pain - do not overdo it Take over the counter pain medication This helps the muscle and nerve tissues become less irritable and calm down faster Choose ONE of the following over-the-counter anti-inflammatory medications: Acetaminophen 500mg  tabs (Tylenol) 1-2 pills with every meal and just before bedtime (avoid if you have liver problems or if you have acetaminophen in you narcotic prescription) Naproxen 220mg  tabs (ex. Aleve, Naprosyn) 1-2 pills twice a day (avoid if you have kidney, stomach, IBD, or bleeding problems) Ibuprofen 200mg  tabs (ex. Advil, Motrin) 3-4 pills with every meal and just before bedtime (avoid if you have kidney, stomach, IBD, or bleeding problems) Take with food/snack several times a day as directed for at least 2 weeks to help keep pain / soreness down & more  manageable. Take Narcotic prescription pain medication for more severe pain A prescription for strong pain control is often given to you upon discharge (for example: oxycodone/Percocet, hydrocodone/Norco/Vicodin, or tramadol/Ultram) Take your pain medication as prescribed. Be mindful that most narcotic prescriptions contain Tylenol (acetaminophen) as well - avoid taking too much Tylenol. If you are having problems/concerns with the prescription medicine (does not control pain, nausea, vomiting, rash, itching, etc.), please call us [phone] to see if we need to switch you to a different pain medicine that will work better for you and/or control your side effects better. If you need a refill on your pain medication, you must call the office before 4 pm and on weekdays only.  By federal law, prescriptions for narcotics cannot be called into a pharmacy.  They must be filled out on paper & picked up from our office by the patient or authorized caretaker.  Prescriptions cannot be filled after 4 pm nor on weekends.    WHEN TO CALL us [phone] Severe uncontrolled or worsening pain  Fever over 101 F (38.5 C) Concerns with the incision: Worsening pain, redness, rash/hives, swelling, bleeding, or drainage Reactions / problems with new medications (itching, rash, hives, nausea, etc.) Nausea and/or vomiting Difficulty urinating Difficulty breathing Worsening fatigue, dizziness, lightheadedness, blurred vision Other concerns If you are not getting better after two weeks or are noticing you are getting worse, contact our office [phone] for further advice.  We may need to adjust your medications, re-evaluate you in the office, send you to the emergency room, or see what other things we can do to help. The clinic staff is available to answer your questions during regular business hours (8:30am-5pm).  Please dont hesitate to call and ask to speak to one of our nurses for clinical concerns.    A  surgeon from [facility] is always on call at the hospitals 24 hours/day If you have a medical emergency, go to the nearest emergency room or call 911.  FOLLOW UP in our office One the day of your discharge from the hospital (or the next business weekday), please call [facility] to set up or confirm an appointment to see your surgeon in the office for a follow-up appointment.  Usually it is 2-3 weeks after your surgery.   If you have skin staples at your incision(s), let the office know so we can set up a time in the office for the nurse to remove them (usually around 10 days after surgery). Make sure that you call for  appointments the day of discharge (or the next business weekday) from the hospital to ensure a convenient appointment time. IF YOU HAVE DISABILITY OR FAMILY LEAVE FORMS, BRING THEM TO THE OFFICE FOR PROCESSING.  DO NOT GIVE THEM TO YOUR DOCTOR.  [facility] [address] [place], [place], [place]  [address] ? [phone] - Main [phone] - [place],  [phone] - Fax [email or web address]  GETTING TO GOOD BOWEL HEALTH. It is expected for your digestive tract to need a few months to get back to normal.  It is common for your bowel movements and stools to be irregular.  You will have occasional bloating and cramping that should eventually fade away.  Until you are eating solid food normally, off all pain medications, and back to regular activities; your bowels will not be normal.   Avoiding constipation The goal: ONE SOFT BOWEL MOVEMENT A DAY!    Drink plenty of fluids.  Choose water first. TAKE A FIBER SUPPLEMENT EVERY DAY THE REST OF YOUR LIFE During your first week back home, gradually add back a fiber supplement every day Experiment which form you can tolerate.   There are many forms such as powders, tablets, wafers, gummies, etc Psyllium bran (Metamucil), methylcellulose (Citrucel), Miralax or Glycolax,  Benefiber, Flax Seed.  Adjust the dose week-by-week (1/2 dose/day to 6 doses a day) until you are moving your bowels 1-2 times a day.  Cut back the dose or try a different fiber product if it is giving you problems such as diarrhea or bloating. Sometimes a laxative is needed to help jump-start bowels if constipated until the fiber supplement can help regulate your bowels.  If you are tolerating eating & you are farting, it is okay to try a gentle laxative such as double dose MiraLax, prune juice, or Milk of Magnesia.  Avoid using laxatives too often. Stool softeners can sometimes help counteract the constipating effects of narcotic pain medicines.  It can also cause diarrhea, so avoid using for too long. If you are still constipated despite taking fiber daily, eating solids, and a few doses of laxatives, call our office. Controlling diarrhea Try drinking liquids and eating bland foods for a few days to avoid stressing your intestines further. Avoid dairy products (especially milk & ice cream) for a short time.  The intestines often can lose the ability to digest lactose when stressed. Avoid foods that cause gassiness or bloating.  Typical foods include beans and other legumes, cabbage, broccoli, and dairy foods.  Avoid greasy, spicy, fast foods.  Every person has some sensitivity to other foods, so listen to your body and avoid those foods that trigger problems for you. Probiotics (such as active yogurt, Align, etc) may help repopulate the intestines and colon with normal bacteria and calm down a sensitive digestive tract Adding a fiber supplement gradually can help thicken stools by absorbing excess fluid and retrain the intestines to act more normally.  Slowly increase the dose over a few weeks.  Too much fiber too soon can backfire and cause cramping & bloating. It is okay to try and slow down diarrhea with a few doses of antidiarrheal medicines.   Bismuth subsalicylate (ex. Kayopectate, Pepto Bismol)  for a few doses can help control diarrhea.  Avoid if pregnant.   Loperamide (Imodium) can slow down diarrhea.  Start with one tablet (2mg ) first.  Avoid if you are having fevers or severe pain.  ILEOSTOMY PATIENTS WILL HAVE CHRONIC DIARRHEA since their colon is  not in use.    Drink plenty of liquids.  You will need to drink even more glasses of water/liquid a day to avoid getting dehydrated. Record output from your ileostomy.  Expect to empty the bag every 3-4 hours at first.  Most people with a permanent ileostomy empty their bag 4-6 times at the least.   Use antidiarrheal medicine (especially Imodium) several times a day to avoid getting dehydrated.  Start with a dose at bedtime & breakfast.  Adjust up or down as needed.  Increase antidiarrheal medications as directed to avoid emptying the bag more than 8 times a day (every 3 hours). Work with your wound ostomy nurse to learn care for your ostomy.  See ostomy care instructions. TROUBLESHOOTING IRREGULAR BOWELS 1) Start with a soft & bland diet. No spicy, greasy, or fried foods.  2) Avoid gluten/wheat or dairy products from diet to see if symptoms improve. 3) Miralax 17gm or flax seed mixed in [place]. water or juice-daily. May use 2-4 times a day as needed. 4) Gas-X, Phazyme, etc. as needed for gas & bloating.  5) Prilosec (omeprazole) over-the-counter as needed 6)  Consider probiotics (Align, Activa, etc) to help calm the bowels down  Call your doctor if you are getting worse or not getting better.  Sometimes further testing (cultures, endoscopy, X-ray studies, CT scans, bloodwork, etc.) may be needed to help diagnose and treat the cause of the diarrhea. [facility], [place], [place], [place], [place]  [address] [phone] - Main.    [phone]  - Toll Free.   [phone] - Fax [email or web address]    ############################   Pelvic floor muscle training exercises ("Kegels") can help  strengthen the muscles under the uterus, bladder, and bowel (large intestine). They can help both men and women who have problems with urine leakage or bowel control.  A pelvic floor muscle training exercise is like pretending that you have to urinate, and then holding it. You relax and tighten the muscles that control urine flow. It's important to find the right muscles to tighten.  The next time you have to urinate, start to go and then stop. Feel the muscles in your vagina, bladder, or anus get tight and move up. These are the pelvic floor muscles. If you feel them tighten, you've done the exercise right. If you are still not sure whether you are tightening the right muscles, keep in mind that all of the muscles of the pelvic floor relax and contract at the same time. Because these muscles control the bladder, rectum, and vagina, the following tips may help: Women: Insert a finger into your vagina. Tighten the muscles as if you are holding in your urine, then let go. You should feel the muscles tighten and move up and down.  Men: Insert a finger into your rectum. Tighten the muscles as if you are holding in your urine, then let go. You should feel the muscles tighten and move up and down. These are the same muscles you would tighten if you were trying to prevent yourself from passing gas.  It is very important that you keep the following muscles relaxed while doing pelvic floor muscle training exercises: Abdominal  Buttocks (the deeper, anal sphincter muscle should contract)  Thigh   A woman can also strengthen these muscles by using a vaginal cone, which is a weighted device that is inserted into the vagina. Then you try to tighten the pelvic floor muscles to hold the device  in place. If you are unsure whether you are doing the pelvic floor muscle training correctly, you can use biofeedback and electrical stimulation to help find the correct muscle group to work. Biofeedback is a method of  positive reinforcement. Electrodes are placed on the abdomen and along the anal area. Some therapists place a sensor in the vagina in women or anus in men to monitor the contraction of pelvic floor muscles.  A monitor will display a graph showing which muscles are contracting and which are at rest. The therapist can help find the right muscles for performing pelvic floor muscle training exercises.   PERFORMING PELVIC FLOOR EXERCISES: 1. Begin by emptying your bladder. 2. Tighten the pelvic floor muscles and hold for a count of 10. 3. Relax the muscles completely for a count of 10. 4. Do 10 repititions, 3 to 5 times a day (morning, afternoon, and night). You can do these exercises at any time and any place. Most people prefer to do the exercises while lying down or sitting in a chair. After 4 - 6 weeks, most people notice some improvement. It may take as long as 3 months to see a major change. After a couple of weeks, you can also try doing a single pelvic floor contraction at times when you are likely to leak (for example, while getting out of a chair). A word of caution: Some people feel that they can speed up the progress by increasing the number of repetitions and the frequency of exercises. However, over-exercising can instead cause muscle fatigue and increase urine leakage. If you feel any discomfort in your abdomen or back while doing these exercises, you are probably doing them wrong. Breathe deeply and relax your body when you are doing these exercises. Make sure you are not tightening your stomach, thigh, buttock, or chest muscles. When done the right way, pelvic floor muscle exercises have been shown to be very effective at improving urinary continence.  Pelvic Floor Pain / Incontinence  Do you suffer from pelvic pain or incontinence? Do you have pain in the pelvis, low back or hips that is associated with sitting, walking, urination or intercourse? Have you experienced leaking of urine or  feces when coughing, sneezing or laughing? Do you have pain in the pelvic area associated with cancer?  These are conditions that are common with pelvic floor muscle dysfunction. Over time, due to stress, scar tissue, surgeries and the natural course of aging, our muscles may become weak or overstressed and can spasm. This can lead to pain, weakness, incontinence or decreased quality of life.  Men and women with pelvic floor dysfunction frequently describe:  A falling out feeling. Pain or burning in the abdomen, tailbone or perineal area. Constipation or bowel elimination problems or difficulty initiating urination. Unresolved low back or hip pain. Frequency and urgency when going to the bathroom. Leaking of urine or feces. Pain with intercourse.  [email or web address]  To make a referral or for more information about [facility] Pelvic Floor Therapy Program, call  [facility]) - [place] [id number] [place]) - [facility]) - [phone]

## 2021-05-27 ENCOUNTER — Other Ambulatory Visit: Payer: Self-pay

## 2021-05-27 ENCOUNTER — Encounter (HOSPITAL_COMMUNITY): Payer: Self-pay | Admitting: Surgery

## 2021-05-27 ENCOUNTER — Other Ambulatory Visit (HOSPITAL_COMMUNITY): Payer: Self-pay

## 2021-05-27 LAB — BASIC METABOLIC PANEL
Anion gap: 11 (ref 5–15)
BUN: 14 mg/dL (ref 8–23)
CO2: 21 mmol/L — ABNORMAL LOW (ref 22–32)
Calcium: 8.4 mg/dL — ABNORMAL LOW (ref 8.9–10.3)
Chloride: 97 mmol/L — ABNORMAL LOW (ref 98–111)
Creatinine, Ser: 1.08 mg/dL — ABNORMAL HIGH (ref 0.44–1.00)
GFR, Estimated: 49 mL/min — ABNORMAL LOW (ref >60)
Glucose, Bld: 170 mg/dL — ABNORMAL HIGH (ref 70–99)
Potassium: 4.1 mmol/L (ref 3.5–5.1)
Sodium: 129 mmol/L — ABNORMAL LOW (ref 135–145)

## 2021-05-27 LAB — CBC
HCT: 31.3 % — ABNORMAL LOW (ref 36.0–46.0)
Hemoglobin: 9.9 g/dL — ABNORMAL LOW (ref 12.0–15.0)
MCH: 29.8 pg (ref 26.0–34.0)
MCHC: 31.6 g/dL (ref 30.0–36.0)
MCV: 94.3 fL (ref 80.0–100.0)
Platelets: 296 10*3/uL (ref 150–400)
RBC: 3.32 MIL/uL — ABNORMAL LOW (ref 3.87–5.11)
RDW: 14.9 % (ref 11.5–15.5)
WBC: 10 10*3/uL (ref 4.0–10.5)
nRBC: 0 % (ref 0.0–0.2)

## 2021-05-27 LAB — MAGNESIUM: Magnesium: 1.7 mg/dL (ref 1.7–2.4)

## 2021-05-27 LAB — MRSA NEXT GEN BY PCR, NASAL: MRSA by PCR Next Gen: NOT DETECTED

## 2021-05-27 MED ORDER — ALBUMIN HUMAN 5 % IV SOLN
12.5000 g | Freq: Four times a day (QID) | INTRAVENOUS | Status: AC | PRN
  Filled 2021-05-27: qty 250

## 2021-05-27 MED ORDER — IRON DEXTRAN 50 MG/ML IJ SOLN
500.0000 mg | Freq: Once | INTRAMUSCULAR | Status: AC
  Administered 2021-05-27: 500 mg via INTRAVENOUS
  Filled 2021-05-27: qty 10

## 2021-05-27 MED ORDER — GABAPENTIN 100 MG PO CAPS
200.0000 mg | ORAL_CAPSULE | Freq: Three times a day (TID) | ORAL | Status: DC
  Administered 2021-05-27 – 2021-05-30 (×10): 200 mg via ORAL
  Filled 2021-05-27 (×10): qty 2

## 2021-05-27 MED ORDER — ACETAMINOPHEN 500 MG PO TABS
500.0000 mg | ORAL_TABLET | Freq: Three times a day (TID) | ORAL | Status: DC
  Administered 2021-05-27 – 2021-05-30 (×12): 500 mg via ORAL
  Filled 2021-05-27 (×12): qty 1

## 2021-05-27 MED ORDER — SIMETHICONE 80 MG PO CHEW
40.0000 mg | CHEWABLE_TABLET | Freq: Four times a day (QID) | ORAL | Status: AC
  Administered 2021-05-27 – 2021-05-28 (×8): 40 mg via ORAL
  Filled 2021-05-27 (×8): qty 1

## 2021-05-27 MED ORDER — HYDROCODONE-ACETAMINOPHEN 5-325 MG PO TABS: 1.0000 | ORAL_TABLET | ORAL | Status: DC | PRN

## 2021-05-27 MED ORDER — ALBUMIN HUMAN 5 % IV SOLN
25.0000 g | Freq: Once | INTRAVENOUS | Status: AC
  Administered 2021-05-27: 25 g via INTRAVENOUS
  Filled 2021-05-27: qty 500

## 2021-05-27 MED ORDER — IRON DEXTRAN 50 MG/ML IJ SOLN
25.0000 mg | Freq: Once | INTRAMUSCULAR | Status: AC
  Administered 2021-05-27: 25 mg via INTRAVENOUS
  Filled 2021-05-27: qty 0.5

## 2021-05-27 NOTE — Progress Notes (Signed)
Pharmacy Brief Note - Alvimopan (Entereg)  The standing order set for alvimopan (Entereg) now includes an automatic order to discontinue the drug after the patient has had a bowel movement. The change was approved by the [place] and the Medical Executive Committee.   This patient has had bowel movements documented by nursing. Therefore, alvimopan has been discontinued. If there are questions, please contact the pharmacy at [phone].   Thank you-   Dia Sitter, PharmD, BCPS 05/27/2021 1:13 PM

## 2021-05-27 NOTE — Progress Notes (Signed)
°  Transition of Care [facility]) Screening Note   Patient Details  Name: Brooke Beasley Date of Birth: 03/07/1931   Transition of Care [facility]) CM/SW Contact:    Lennart Pall, LCSW Phone Number: 05/27/2021, 8:55 AM    Transition of Care Department [facility]) has reviewed patient and no TOC needs have been identified at this time. We will continue to monitor patient advancement through interdisciplinary progression rounds. If new patient transition needs arise, please place a TOC consult.

## 2021-05-27 NOTE — Progress Notes (Signed)
Brooke Beasley [id number] 01/30/1931  CARE TEAM:  PCP: [name], MD  Outpatient Care Team: Patient Care Team: [name], MD as PCP - General (Family Medicine) Kate Sable, MD as PCP - Cardiology (Cardiology) Dannielle Karvonen, RN as [facility] Vera Furniss, Remo Lipps, MD as Consulting Physician (General Surgery) Lin Landsman, MD as Consulting Physician (Gastroenterology) Kate Sable, MD as Consulting Physician (Cardiology)  Inpatient Treatment Team: Treatment Team: Attending Provider: Michael Boston, MD; Technician: Leda Quail, NT; Registered Nurse: Claud Kelp, RN; Utilization Review: Lacretia Leigh, RN; Registered Nurse: Rodrigo Ran, RN   Problem List:   Principal Problem:   Complete rectal prolapse Active Problems:   Hypothyroidism   HLD (hyperlipidemia)   Hypertension, essential   Irritable bowel syndrome with constipation   Anxiety associated with depression   GERD (gastroesophageal reflux disease)   Insomnia   Dizziness   Restless legs   CKD (chronic kidney disease) stage 3, GFR 30-59 ml/min (HCC)   Iron deficiency anemia   Pruritus   1 Day Post-Op  05/26/2021  PPOST-OPERATIVE DIAGNOSIS: RECTAL PROLAPSE   PROCEDURE:  -ROBOTIC LOW ANTERIOR RECTOSIGMOID RESECTION -RECTOPEXY -INTRAOPERATIVE ASSESSMENT OF TISSUE VASCULAR PERFUSION USING ICG (indocyanine green) IMMUNOFLUORESCENCE -TRANSVERSUS ABDOMINIS PLANE (TAP) BLOCK - BILATERAL -RIGID PROCTOSCOPY -ANORECTAL EUA   SURGEON:  Adin Hector, MD  OR FINDINGS: Very redundant sigmoid colon.  Circumferential prolapse of distal and mid rectum circumferentially with irritated hemorrhoids.  About 45 cm redundant colon resected from mid descending to proximal rectum.  Suture rectopexy done.  Patient has a 54 EEA end mid descending colon-to-end mid rectum stapled anastomosis.   At rest 10 cm from the anal verge.  Irritated internal  hemorrhoids but grade 2 after prolapse corrected.  Held off on more aggressive resection/ligation/pexy.  Suspect hemorrhoid bleeding from chronic prolapse irritation.    Assessment  Mild hypotension w/o hemorrhagic shock  [facility] Stay = 1 days)  Plan:  -volume -do albumin bolus first.  Try to wean off Neo-Synephrine. -I would be guarded against too much volume as that will lead to postop ileus and cardiopulmonary issues. -Chronic hypertension.  Hold hypertensive meds.  As needed for breakthrough hypertension -Improved pain control.  Scheduled Tylenol.  We will add very low-dose gabapentin.  Switched to Vicodin.  As needed as needed.  Follow. DC Foley per protocol to avoid UTI/urosepsis.  I&O cath as needed.  Continue strict INO. -Enhance recovery protocol. -Dysphagia 1 diet since alert with no history of prior oral dysphagia and no nausea. -VTE prophylaxis- SCDs, etc -mobilize as tolerated to help recovery -try and get her up to chair at least and follow.  Most likely would benefit from PT and OT consultations but I am pretty sure they will not do anything until she is off her Neo-Synephrine.  Reevaluate tomorrow.   -Keep in stepdown using today -Patient placed on contact precautions erroneously.  DC. -Discussed with patient, her daughter at bedside, and nurse just outside room.  Disposition:  Disposition:  The patient is from: Home  Anticipate discharge to:   [facility]  Anticipated Date of Discharge is:  February 4,2023    Barriers to discharge:  Pending Clinical improvement (more likely than not)  Patient currently is NOT MEDICALLY STABLE for discharge from the hospital from a surgery standpoint.      30 minutes spent in review, evaluation, examination, counseling, and coordination of care.   I have reviewed this patient's available data, including medical history, events of note, physical  examination and test results as part of my evaluation.  A significant portion of that  time was spent in counseling.  Care during the described time interval was provided by me.  05/27/2021    Subjective: (Chief complaint)  Patient with some mild hypotension requiring Neo-Synephrine in the PACU recovery room.  Discussion made between Dr. Ola Spurr with anesthesia and on-call surgeon Dr. Brantley Stage.  They decided to place in stepdown unit to be safe.  Daughter at bedside.  Patient denies any nausea or vomiting.  Tolerating clear liquids.  Patient does note some soreness.  Tylenol enough.  Has tramadol at home but does not like taking it.  He is wondering if there is a different alternative.  Objective:  Vital signs:  Vitals:   05/27/21 0615 05/27/21 0630 05/27/21 0655 05/27/21 0700  BP:      Pulse: 79 77 88 82  Resp: 19 20 (!) 23 16  Temp:      TempSrc:      SpO2: 98% 96% 98% 98%       Intake/Output   Yesterday:  01/31 0701 - 02/01 0700 In: 2550 [I.V.:2450; IV Piggyback:100] Out: 1155 [Urine:950; Drains:105; Blood:100] This shift:  Total I/O In: 460 [P.O.:360; IV Piggyback:100] Out: -   Bowel function:  Flatus: No  BM:  No  Drain: Serosanguinous   Physical Exam:  General: Pt awake/alert in no acute distress.  Bright and alert.  Chatty/conversational. Eyes: PERRL, normal EOM.  Sclera clear.  No icterus Neuro: CN II-XII intact w/o focal sensory/motor deficits. Lymph: No head/neck/groin lymphadenopathy Psych:  No delerium/psychosis/paranoia.  Oriented x 4 HENT: Normocephalic, Mucus membranes moist.  No thrush.  Remains severely hard of hearing. Neck: Supple, No tracheal deviation.  No obvious thyromegaly Chest: No pain to chest wall compression.  Good respiratory excursion.  No audible wheezing CV:  Pulses intact.  Regular rhythm.  No major extremity edema MS: Normal AROM mjr joints.  No obvious deformity  Abdomen: Soft.  Moderately distended.  Mildly tender at incisions only.  No evidence of peritonitis.  No incarcerated hernias.  Ext:   No  deformity.  No mjr edema.  No cyanosis Skin: No petechiae / purpurea.  No major sores.  Warm and dry    Results:   Cultures: Recent Results (from the past 720 hour(s))  SARS CORONAVIRUS 2 (TAT 6-24 HRS) Nasopharyngeal Nasopharyngeal Swab     Status: None   Collection Time: 05/22/21 10:08 AM   Specimen: Nasopharyngeal Swab  Result Value Ref Range Status   SARS Coronavirus 2 NEGATIVE NEGATIVE Final    Comment: (NOTE) SARS-CoV-2 target nucleic acids are NOT DETECTED.  The SARS-CoV-2 RNA is generally detectable in upper and lower respiratory specimens during the acute phase of infection. Negative results do not preclude SARS-CoV-2 infection, do not rule out co-infections with other pathogens, and should not be used as the sole basis for treatment or other patient management decisions. Negative results must be combined with clinical observations, patient history, and epidemiological information. The expected result is Negative.  Fact Sheet for Patients: SugarRoll.be  Fact Sheet for Healthcare Providers: https://www.woods-mathews.com/  This test is not yet approved or cleared by the [place] FDA and  has been authorized for detection and/or diagnosis of SARS-CoV-2 by FDA under an Emergency Use Authorization (EUA). This EUA will remain  in effect (meaning this test can be used) for the duration of the COVID-19 declaration under Se ction 564(b)(1) of the Act, 21 U.S.C. section 360bbb-3(b)(1), unless the authorization is  terminated or revoked sooner.  Performed at [facility], [place] [address] [place], [place] [address]   MRSA Next Gen by PCR, Nasal     Status: None   Collection Time: 05/27/21  2:56 AM   Specimen: Nasal Mucosa; Nasal Swab  Result Value Ref Range Status   MRSA by PCR Next Gen NOT DETECTED NOT DETECTED Final    Comment: (NOTE) The GeneXpert MRSA Assay (FDA approved for NASAL specimens only), is one component of  a comprehensive MRSA colonization surveillance program. It is not intended to diagnose MRSA infection nor to guide or monitor treatment for MRSA infections. Test performance is not FDA approved in patients less than 54 years old. Performed at [facility], [place] [address], [place], [place] [address]     Labs: Results for orders placed or performed during the hospital encounter of 05/26/21 (from the past 48 hour(s))  Type and screen [place]     Status: None   Collection Time: 05/26/21 11:20 AM  Result Value Ref Range   ABO/RH(D) O NEG    Antibody Screen NEG    Sample Expiration      05/29/2021,2359 Performed at [facility], [place] [address], [place], [place] [address]   Basic metabolic panel     Status: Abnormal   Collection Time: 05/27/21  2:24 AM  Result Value Ref Range   Sodium 129 (L) 135 - 145 mmol/L   Potassium 4.1 3.5 - 5.1 mmol/L   Chloride 97 (L) 98 - 111 mmol/L   CO2 21 (L) 22 - 32 mmol/L   Glucose, Bld 170 (H) 70 - 99 mg/dL    Comment: Glucose reference range applies only to samples taken after fasting for at least 8 hours.   BUN 14 8 - 23 mg/dL   Creatinine, Ser 1.08 (H) 0.44 - 1.00 mg/dL   Calcium 8.4 (L) 8.9 - 10.3 mg/dL   GFR, Estimated 49 (L) >60 mL/min    Comment: (NOTE) Calculated using the CKD-EPI Creatinine Equation (2021)    Anion gap 11 5 - 15    Comment: Performed at [facility], [place] [address], [place], [place] [address]  CBC     Status: Abnormal   Collection Time: 05/27/21  2:24 AM  Result Value Ref Range   WBC 10.0 4.0 - 10.5 K/uL   RBC 3.32 (L) 3.87 - 5.11 MIL/uL   Hemoglobin 9.9 (L) 12.0 - 15.0 g/dL   HCT 31.3 (L) 36.0 - 46.0 %   MCV 94.3 80.0 - 100.0 fL   MCH 29.8 26.0 - 34.0 pg   MCHC 31.6 30.0 - 36.0 g/dL   RDW 14.9 11.5 - 15.5 %   Platelets 296 150 - 400 K/uL   nRBC 0.0 0.0 - 0.2 %    Comment: Performed at [facility], [place] [address] [place], [place] [address]  Magnesium     Status: None   Collection Time: 05/27/21  2:24 AM  Result Value Ref Range   Magnesium 1.7 1.7 - 2.4 mg/dL    Comment: Performed at [facility], [place] [address] [place], [place] [address]  MRSA Next Gen by PCR, Nasal     Status: None   Collection Time: 05/27/21  2:56 AM   Specimen: Nasal Mucosa; Nasal Swab  Result Value Ref Range   MRSA by PCR Next Gen NOT DETECTED NOT DETECTED    Comment: (NOTE) The GeneXpert MRSA Assay (FDA  approved for NASAL specimens only), is one component of a comprehensive MRSA colonization surveillance program. It is not intended to diagnose MRSA infection nor to guide or monitor treatment for MRSA infections. Test performance is not FDA approved in patients less than 46 years old. Performed at [facility], [place] [address] [place], [place] [address]     Imaging / Studies: No results found.  Medications / Allergies: per chart  Antibiotics: Anti-infectives (From admission, onward)    Start     Dose/Rate Route Frequency Ordered Stop   05/27/21 0200  cefoTEtan (CEFOTAN) 2 g in sodium chloride 0.9 % 100 mL IVPB        2 g 200 mL/hr over 30 Minutes Intravenous Every 12 hours 05/26/21 2137 05/27/21 0407   05/26/21 1400  neomycin (MYCIFRADIN) tablet 1,000 mg  Status:  Discontinued       See Hyperspace for full Linked Orders Report.   1,000 mg Oral 3 times per day 05/26/21 1109 05/26/21 1119   05/26/21 1400  metroNIDAZOLE (FLAGYL) tablet 1,000 mg  Status:  Discontinued       See Hyperspace for full Linked Orders Report.   1,000 mg Oral 3 times per day 05/26/21 1109 05/26/21 1119   05/26/21 1115  cefoTEtan (CEFOTAN) 2 g in sodium chloride 0.9 % 100 mL IVPB        2 g 200 mL/hr over 30 Minutes Intravenous On call to O.R. 05/26/21 1109 05/26/21 1435         Note: Portions of this report may have been transcribed using voice recognition software. Every effort was made to  ensure accuracy; however, inadvertent computerized transcription errors may be present.   Any transcriptional errors that result from this process are unintentional.    Adin Hector, MD, FACS, MASCRS Esophageal, Gastrointestinal & Colorectal Surgery Robotic and Minimally Invasive Surgery  [facility], [place]  [place] [place], [place] [address] [phone] Fax [phone] Main  CONTACT INFORMATION:  Weekday (9AM-5PM): Call [facility] main office at [phone]  Weeknight (5PM-9AM) or Weekend/Holiday: Check [email or web address] (password " [email or web address]") for General Surgery [facility] coverage  (Please, do not use SecureChat as it is not reliable communication to operating surgeons for immediate patient care)      05/27/2021  8:13 AM

## 2021-05-28 ENCOUNTER — Other Ambulatory Visit: Payer: Self-pay

## 2021-05-28 ENCOUNTER — Other Ambulatory Visit (HOSPITAL_COMMUNITY): Payer: Self-pay

## 2021-05-28 LAB — CBC
HCT: 21.8 % — ABNORMAL LOW (ref 36.0–46.0)
HCT: 20.6 % — ABNORMAL LOW (ref 36.0–46.0)
Hemoglobin: 7.1 g/dL — ABNORMAL LOW (ref 12.0–15.0)
Hemoglobin: 6.9 g/dL — CL (ref 12.0–15.0)
MCH: 30 pg (ref 26.0–34.0)
MCH: 30.3 pg (ref 26.0–34.0)
MCHC: 32.6 g/dL (ref 30.0–36.0)
MCHC: 33.5 g/dL (ref 30.0–36.0)
MCV: 92 fL (ref 80.0–100.0)
MCV: 90.4 fL (ref 80.0–100.0)
Platelets: 170 10*3/uL (ref 150–400)
Platelets: 173 10*3/uL (ref 150–400)
RBC: 2.37 MIL/uL — ABNORMAL LOW (ref 3.87–5.11)
RBC: 2.28 MIL/uL — ABNORMAL LOW (ref 3.87–5.11)
RDW: 15.3 % (ref 11.5–15.5)
RDW: 15.3 % (ref 11.5–15.5)
WBC: 7.1 10*3/uL (ref 4.0–10.5)
WBC: 7.4 10*3/uL (ref 4.0–10.5)
nRBC: 0 % (ref 0.0–0.2)
nRBC: 0 % (ref 0.0–0.2)

## 2021-05-28 LAB — POTASSIUM
Potassium: 3.4 mmol/L — ABNORMAL LOW (ref 3.5–5.1)
Potassium: 3.4 mmol/L — ABNORMAL LOW (ref 3.5–5.1)

## 2021-05-28 LAB — MAGNESIUM
Magnesium: 1.7 mg/dL (ref 1.7–2.4)
Magnesium: 1.7 mg/dL (ref 1.7–2.4)

## 2021-05-28 LAB — CREATININE, SERUM
Creatinine, Ser: 0.96 mg/dL (ref 0.44–1.00)
Creatinine, Ser: 0.75 mg/dL (ref 0.44–1.00)
GFR, Estimated: 57 mL/min — ABNORMAL LOW (ref >60)
GFR, Estimated: >60 mL/min (ref >60)

## 2021-05-28 LAB — SURGICAL PATHOLOGY

## 2021-05-28 MED ORDER — ADULT MULTIVITAMIN W/MINERALS CH
1.0000 | ORAL_TABLET | Freq: Every day | ORAL | Status: DC
  Administered 2021-05-28 – 2021-05-30 (×3): 1 via ORAL
  Filled 2021-05-28 (×3): qty 1

## 2021-05-28 MED ORDER — FERROUS SULFATE 325 (65 FE) MG PO TABS
325.0000 mg | ORAL_TABLET | Freq: Two times a day (BID) | ORAL | Status: DC
  Administered 2021-05-28 – 2021-05-30 (×5): 325 mg via ORAL
  Filled 2021-05-28 (×5): qty 1

## 2021-05-28 MED ORDER — ENOXAPARIN SODIUM 40 MG/0.4ML IJ SOSY
40.0000 mg | PREFILLED_SYRINGE | INTRAMUSCULAR | Status: DC
  Administered 2021-05-29: 40 mg via SUBCUTANEOUS
  Filled 2021-05-28 (×2): qty 0.4

## 2021-05-28 MED ORDER — POTASSIUM CHLORIDE CRYS ER 20 MEQ PO TBCR: 40.0000 meq | EXTENDED_RELEASE_TABLET | Freq: Every day | ORAL | Status: DC

## 2021-05-28 MED ORDER — MAGNESIUM SULFATE 2 GM/50ML IV SOLN
2.0000 g | Freq: Once | INTRAVENOUS | Status: AC
  Administered 2021-05-28: 2 g via INTRAVENOUS
  Filled 2021-05-28: qty 50

## 2021-05-28 MED ORDER — FAMOTIDINE 20 MG PO TABS
20.0000 mg | ORAL_TABLET | Freq: Every day | ORAL | Status: DC
  Administered 2021-05-28 – 2021-05-30 (×3): 20 mg via ORAL
  Filled 2021-05-28 (×3): qty 1

## 2021-05-28 MED ORDER — ASCORBIC ACID 500 MG PO TABS
500.0000 mg | ORAL_TABLET | Freq: Two times a day (BID) | ORAL | Status: DC
  Administered 2021-05-28 – 2021-05-30 (×5): 500 mg via ORAL
  Filled 2021-05-28 (×5): qty 1

## 2021-05-28 MED ORDER — FUROSEMIDE 10 MG/ML IJ SOLN
40.0000 mg | Freq: Once | INTRAMUSCULAR | Status: AC
  Administered 2021-05-28: 40 mg via INTRAVENOUS
  Filled 2021-05-28: qty 4

## 2021-05-28 MED ORDER — POTASSIUM CHLORIDE CRYS ER 20 MEQ PO TBCR
40.0000 meq | EXTENDED_RELEASE_TABLET | Freq: Two times a day (BID) | ORAL | Status: DC
  Administered 2021-05-28 (×2): 40 meq via ORAL
  Filled 2021-05-28 (×2): qty 2

## 2021-05-28 NOTE — Progress Notes (Signed)
Brooke Beasley [id number] 1930/10/10  CARE TEAM:  PCP: [name], MD  Outpatient Care Team: Patient Care Team: [name], MD as PCP - General (Family Medicine) Kate Sable, MD as PCP - Cardiology (Cardiology) Dannielle Karvonen, RN as [place] Management Jerame Hedding, Remo Lipps, MD as Consulting Physician (General Surgery) Lin Landsman, MD as Consulting Physician (Gastroenterology) Kate Sable, MD as Consulting Physician (Cardiology)  Inpatient Treatment Team: Treatment Team: Attending Provider: Michael Boston, MD; Technician: Leda Quail, NT; Registered Nurse: Claud Kelp, RN; Technician: Ward, Imagene Riches, NT; Registered Nurse: Kathreen Devoid, RN; Utilization Review: Lacretia Leigh, RN   Problem List:   Principal Problem:   Complete rectal prolapse Active Problems:   Hypothyroidism   HLD (hyperlipidemia)   Glaucoma   Hypertension, essential   Irritable bowel syndrome with constipation   Anxiety associated with depression   GERD (gastroesophageal reflux disease)   Insomnia   Dizziness   Restless legs   CKD (chronic kidney disease) stage 3, GFR 30-59 ml/min (HCC)   Iron deficiency anemia   Pruritus   2 Days Post-Op  05/26/2021  PPOST-OPERATIVE DIAGNOSIS: RECTAL PROLAPSE   PROCEDURE:  -ROBOTIC LOW ANTERIOR RECTOSIGMOID RESECTION -RECTOPEXY -INTRAOPERATIVE ASSESSMENT OF TISSUE VASCULAR PERFUSION USING ICG (indocyanine green) IMMUNOFLUORESCENCE -TRANSVERSUS ABDOMINIS PLANE (TAP) BLOCK - BILATERAL -RIGID PROCTOSCOPY -ANORECTAL EUA   SURGEON:  Adin Hector, MD  OR FINDINGS: Very redundant sigmoid colon.  Circumferential prolapse of distal and mid rectum circumferentially with irritated hemorrhoids.  About 45 cm redundant colon resected from mid descending to proximal rectum.  Suture rectopexy done.  Patient has a 55 EEA end mid descending colon-to-end mid rectum stapled anastomosis.   At rest 10 cm  from the anal verge.  Irritated internal hemorrhoids but grade 2 after prolapse corrected.  Held off on more aggressive resection/ligation/pexy.  Suspect hemorrhoid bleeding from chronic prolapse irritation.    Assessment  Stabilizing  [facility] Stay = 2 days)  Plan:  -Lasix x1 since diuretic dependent.  Use albumin/colloid as needed.  Keep on the dry side.  I would be guarded against too much volume as that will lead to postop ileus and cardiopulmonary issues.  Hypokalemia.  Replace.  We will do twice a day since expect more from the Lasix.  Mild hypomagnesemia.  Replace as well.  -Chronic hypertension.  Hold hypertensive meds.  As needed for breakthrough hypertension  Acute postoperative anemia on top of chronic iron deficiency anemia from chronic blood loss from prolapse of rectum and hemorrhoids.  Received IV iron yesterday.  Keep on the dry side.  Oral iron/vitamin C to help.  Hold off on transfusing since not symptomatic and no longer on Neo-Synephrine.  Hold chemical DVT prophylaxis/enoxaparin 24 hours and regroup.  -Improved pain control.  Scheduled Tylenol.  We will add very low-dose gabapentin.  Switched to Vicodin.  As needed as needed.  Follow.  DC Foley per protocol to avoid UTI/urosepsis.  I&O cath as needed.  Continue strict INO. -Enhance recovery protocol.  -Advance diet per enhance recovery protocol.  Soft diet advance to heart healthy as tolerated.    Will place on scheduled famotidine since I think that is her preferred as needed medication at home.  Can have Maalox for breakthrough as well.  -VTE prophylaxis- SCDs, etc. full enoxaparin for 24 hours given acute blood loss anemia  -mobilize as tolerated to help recovery -try and get her up to chair at least and follow.  Will establish PT and OT consultations   -  Okay to transfer to floor.  Surgical floor.  Disposition:  Disposition:  The patient is from: Home  Anticipate discharge to:   tba  Anticipated Date  of Discharge is:  February 4,2023    Barriers to discharge:  Pending Clinical improvement (more likely than not)  Patient currently is NOT MEDICALLY STABLE for discharge from the hospital from a surgery standpoint.      30 minutes spent in review, evaluation, examination, counseling, and coordination of care.   I have reviewed this patient's available data, including medical history, events of note, physical examination and test results as part of my evaluation.  A significant portion of that time was spent in counseling.  Care during the described time interval was provided by me.  05/28/2021    Subjective: (Chief complaint)  Patient with bowel movements.  Tolerated./Dysphagia diet.  Pain better controlled.  Just mildly sore.  Still somewhat weak but was able to get up.  Complains of heartburn.  Did not ask for Maalox.  Objective:  Vital signs:  Vitals:   05/28/21 0300 05/28/21 0400 05/28/21 0500 05/28/21 0600  BP:  (!) 157/57  (!) 143/52  Pulse: (!) 101 97 96 81  Resp:      Temp:  99.5 F (37.5 C)    TempSrc:  Axillary    SpO2: 97% 93%  94%  Weight:   80.3 kg        Intake/Output   Yesterday:  02/01 0701 - 02/02 0700 In: 2435.2 [P.O.:360; I.V.:532.3; IV Piggyback:1542.8] Out: 475 [Urine:475] This shift:  Total I/O In: 0 [I.V.:0] Out: 475 [Urine:475]  Bowel function:  Flatus: YES  BM:  YES  Drain: Serosanguinous   Physical Exam:  General: Pt awake/alert in no acute distress.  Bright and alert.  Chatty/conversational. Eyes: PERRL, normal EOM.  Sclera clear.  No icterus Neuro: CN II-XII intact w/o focal sensory/motor deficits. Lymph: No head/neck/groin lymphadenopathy Psych:  No delerium/psychosis/paranoia.  Oriented x 4 HENT: Normocephalic, Mucus membranes moist.  No thrush.  Remains severely hard of hearing. Neck: Supple, No tracheal deviation.  No obvious thyromegaly Chest: No pain to chest wall compression.  Good respiratory excursion.  No  audible wheezing CV:  Pulses intact.  Regular rhythm.  No major extremity edema MS: Normal AROM mjr joints.  No obvious deformity  Abdomen: Soft.  Mildy distended.  Mildly tender at incisions only.  Improved.  No evidence of peritonitis.  No incarcerated hernias.  Ext:   No deformity.  No mjr edema.  No cyanosis Skin: No petechiae / purpurea.  No major sores.  Warm and dry    Results:   Cultures: Recent Results (from the past 720 hour(s))  SARS CORONAVIRUS 2 (TAT 6-24 HRS) Nasopharyngeal Nasopharyngeal Swab     Status: None   Collection Time: 05/22/21 10:08 AM   Specimen: Nasopharyngeal Swab  Result Value Ref Range Status   SARS Coronavirus 2 NEGATIVE NEGATIVE Final    Comment: (NOTE) SARS-CoV-2 target nucleic acids are NOT DETECTED.  The SARS-CoV-2 RNA is generally detectable in upper and lower respiratory specimens during the acute phase of infection. Negative results do not preclude SARS-CoV-2 infection, do not rule out co-infections with other pathogens, and should not be used as the sole basis for treatment or other patient management decisions. Negative results must be combined with clinical observations, patient history, and epidemiological information. The expected result is Negative.  Fact Sheet for Patients: SugarRoll.be  Fact Sheet for Healthcare Providers: https://www.woods-mathews.com/  This test is not yet  approved or cleared by the [place] and  has been authorized for detection and/or diagnosis of SARS-CoV-2 by FDA under an Emergency Use Authorization (EUA). This EUA will remain  in effect (meaning this test can be used) for the duration of the COVID-19 declaration under Se ction 564(b)(1) of the Act, 21 U.S.C. section 360bbb-3(b)(1), unless the authorization is terminated or revoked sooner.  Performed at [facility], [place] [address], [place], [place] [address]   MRSA Next Gen by PCR, Nasal      Status: None   Collection Time: 05/27/21  2:56 AM   Specimen: Nasal Mucosa; Nasal Swab  Result Value Ref Range Status   MRSA by PCR Next Gen NOT DETECTED NOT DETECTED Final    Comment: (NOTE) The GeneXpert MRSA Assay (FDA approved for NASAL specimens only), is one component of a comprehensive MRSA colonization surveillance program. It is not intended to diagnose MRSA infection nor to guide or monitor treatment for MRSA infections. Test performance is not FDA approved in patients less than 15 years old. Performed at [facility], [place] [address] [place], [place] [address]     Labs: Results for orders placed or performed during the hospital encounter of 05/26/21 (from the past 48 hour(s))  Type and screen [place]     Status: None   Collection Time: 05/26/21 11:20 AM  Result Value Ref Range   ABO/RH(D) O NEG    Antibody Screen NEG    Sample Expiration      05/29/2021,2359 Performed at [facility], [place] [address], [place], [place] [address]   Basic metabolic panel     Status: Abnormal   Collection Time: 05/27/21  2:24 AM  Result Value Ref Range   Sodium 129 (L) 135 - 145 mmol/L   Potassium 4.1 3.5 - 5.1 mmol/L   Chloride 97 (L) 98 - 111 mmol/L   CO2 21 (L) 22 - 32 mmol/L   Glucose, Bld 170 (H) 70 - 99 mg/dL    Comment: Glucose reference range applies only to samples taken after fasting for at least 8 hours.   BUN 14 8 - 23 mg/dL   Creatinine, Ser 1.08 (H) 0.44 - 1.00 mg/dL   Calcium 8.4 (L) 8.9 - 10.3 mg/dL   GFR, Estimated 49 (L) >60 mL/min    Comment: (NOTE) Calculated using the CKD-EPI Creatinine Equation (2021)    Anion gap 11 5 - 15    Comment: Performed at [facility], [place] [address] [place], [place] [address]  CBC     Status: Abnormal   Collection Time: 05/27/21  2:24 AM  Result Value Ref Range   WBC 10.0 4.0 - 10.5 K/uL   RBC 3.32 (L) 3.87 - 5.11 MIL/uL   Hemoglobin 9.9 (L) 12.0 -  15.0 g/dL   HCT 31.3 (L) 36.0 - 46.0 %   MCV 94.3 80.0 - 100.0 fL   MCH 29.8 26.0 - 34.0 pg   MCHC 31.6 30.0 - 36.0 g/dL   RDW 14.9 11.5 - 15.5 %   Platelets 296 150 - 400 K/uL   nRBC 0.0 0.0 - 0.2 %    Comment: Performed at [facility], [place] [address] [place], [place] [address]  Magnesium     Status: None   Collection Time: 05/27/21  2:24 AM  Result Value Ref Range   Magnesium 1.7 1.7 - 2.4 mg/dL    Comment: Performed at [facility], [place] [address] [place], [place] [address]  MRSA Next Gen by PCR, Nasal     Status: None   Collection Time: 05/27/21  2:56 AM   Specimen: Nasal Mucosa; Nasal Swab  Result Value Ref Range   MRSA by PCR Next Gen NOT DETECTED NOT DETECTED    Comment: (NOTE) The GeneXpert MRSA Assay (FDA approved for NASAL specimens only), is one component of a comprehensive MRSA colonization surveillance program. It is not intended to diagnose MRSA infection nor to guide or monitor treatment for MRSA infections. Test performance is not FDA approved in patients less than 3 years old. Performed at [facility], [place] [address], [place], [place] [address]   CBC     Status: Abnormal   Collection Time: 05/28/21  2:37 AM  Result Value Ref Range   WBC 7.1 4.0 - 10.5 K/uL   RBC 2.37 (L) 3.87 - 5.11 MIL/uL   Hemoglobin 7.1 (L) 12.0 - 15.0 g/dL    Comment: REPEATED TO VERIFY   HCT 21.8 (L) 36.0 - 46.0 %   MCV 92.0 80.0 - 100.0 fL   MCH 30.0 26.0 - 34.0 pg   MCHC 32.6 30.0 - 36.0 g/dL   RDW 15.3 11.5 - 15.5 %   Platelets 170 150 - 400 K/uL   nRBC 0.0 0.0 - 0.2 %    Comment: Performed at [facility], [place] [address], [place], [place] [address]  Magnesium     Status: None   Collection Time: 05/28/21  2:37 AM  Result Value Ref Range   Magnesium 1.7 1.7 - 2.4 mg/dL    Comment: Performed at [facility], [place] [address] [place], [place] [address]  Potassium     Status: Abnormal    Collection Time: 05/28/21  2:37 AM  Result Value Ref Range   Potassium 3.4 (L) 3.5 - 5.1 mmol/L    Comment: DELTA CHECK NOTED Performed at [facility], [place] [address], [place], [place] [address]   Creatinine, serum     Status: Abnormal   Collection Time: 05/28/21  2:37 AM  Result Value Ref Range   Creatinine, Ser 0.96 0.44 - 1.00 mg/dL   GFR, Estimated 57 (L) >60 mL/min    Comment: (NOTE) Calculated using the CKD-EPI Creatinine Equation (2021) Performed at [facility], [place] [address], [place], [place] [address]     Imaging / Studies: No results found.  Medications / Allergies: per chart  Antibiotics: Anti-infectives (From admission, onward)    Start     Dose/Rate Route Frequency Ordered Stop   05/27/21 0200  cefoTEtan (CEFOTAN) 2 g in sodium chloride 0.9 % 100 mL IVPB        2 g 200 mL/hr over 30 Minutes Intravenous Every 12 hours 05/26/21 2137 05/27/21 0407   05/26/21 1400  neomycin (MYCIFRADIN) tablet 1,000 mg  Status:  Discontinued       See Hyperspace for full Linked Orders Report.   1,000 mg Oral 3 times per day 05/26/21 1109 05/26/21 1119   05/26/21 1400  metroNIDAZOLE (FLAGYL) tablet 1,000 mg  Status:  Discontinued       See Hyperspace for full Linked Orders Report.   1,000 mg Oral 3 times per day 05/26/21 1109 05/26/21 1119   05/26/21 1115  cefoTEtan (CEFOTAN) 2 g in sodium chloride 0.9 % 100 mL IVPB        2 g 200 mL/hr over 30 Minutes Intravenous On call to  O.R. 05/26/21 1109 05/26/21 1435         Note: Portions of this report may have been transcribed using voice recognition software. Every effort was made to ensure accuracy; however, inadvertent computerized transcription errors may be present.   Any transcriptional errors that result from this process are unintentional.    Adin Hector, MD, FACS, MASCRS Esophageal, Gastrointestinal & Colorectal Surgery Robotic and Minimally Invasive Surgery  [facility]  [place] [facility], [place]  [facility]. [address] [place], [place] [address] [phone] Fax [phone] Main  CONTACT INFORMATION:  Weekday (9AM-5PM): Call [facility] main office at [phone]  Weeknight (5PM-9AM) or Weekend/Holiday: Check [email or web address] (password " [email or web address]") for General Surgery [facility] coverage  (Please, do not use SecureChat as it is not reliable communication to operating surgeons for immediate patient care)      05/28/2021  6:57 AM

## 2021-05-28 NOTE — Progress Notes (Signed)
PHARMACY NOTE:  RENAL DOSAGE ADJUSTMENT Pepcid 20 bid> 20 qday for CrCl < 60 ml/min.   Eudelia Bunch, Pharm.D 05/28/2021 7:32 AM

## 2021-05-28 NOTE — Evaluation (Addendum)
Physical Therapy Evaluation Patient Details Name: Brooke Beasley MRN: [id number] DOB: Aug 28, 1930 Today's Date: 05/28/2021  History of Present Illness  Patient is an 88 year old female admitted 05/26/21 with complete rectal prolaspse and is now ROBOTIC LOW ANTERIOR RECTOSIGMOID RESECTION  Clinical Impression  Pt admitted with above diagnosis.  Pt currently with functional limitations due to the deficits listed below (see PT Problem List). Pt will benefit from skilled PT to increase their independence and safety with mobility to allow discharge to the venue listed below.   Pt assisted with ambulating in hallway and reports feeling fatigued this evening (has been up to [facility] and washed hands at sink multiple times per daughter).  Pt typically ambulatory without assistive device and daughter lives with her. Pt would benefit from staff ambulating with pt during acute stay.  Pt would benefit from RW upon d/c home and agreeable. Pt also with edematous left hand (RN aware) so pt encouraged to perform hand movements (open/close fist and each fingertip to thumb) as well as elevate hand above heart.      Recommendations for follow up therapy are one component of a multi-disciplinary discharge planning process, led by the attending physician.  Recommendations may be updated based on patient status, additional functional criteria and insurance authorization.  Follow Up Recommendations Home health PT    Assistance Recommended at Discharge Intermittent Supervision/Assistance  Patient can return home with the following  A little help with walking and/or transfers;Help with stairs or ramp for entrance;A little help with bathing/dressing/bathroom    Equipment Recommendations Rolling walker (2 wheels)  Recommendations for Other Services       Functional Status Assessment Patient has had a recent decline in their functional status and demonstrates the ability to make significant improvements in function in  a reasonable and predictable amount of time.     Precautions / Restrictions Precautions Precaution Comments: abdominal drain Restrictions Weight Bearing Restrictions: No      Mobility  Bed Mobility Overal bed mobility: Needs Assistance Bed Mobility: Rolling, Sidelying to Sit, Sit to Sidelying Rolling: Min guard Sidelying to sit: Min assist     Sit to sidelying: Min guard General bed mobility comments: verbal cues for log roll technique, assist for trunk upright    Transfers Overall transfer level: Needs assistance Equipment used: Rolling walker (2 wheels) Transfers: Sit to/from Stand Sit to Stand: Min guard           General transfer comment: verbal cues for safe technique    Ambulation/Gait Ambulation/Gait assistance: Min guard Gait Distance (Feet): 160 Feet Assistive device: Rolling walker (2 wheels) Gait Pattern/deviations: Step-through pattern, Decreased stride length       General Gait Details: verbal cues for use of RW, pt steady with RW, distance to pt tolerance (pt reports being fatigued)  Stairs            Wheelchair Mobility    Modified Rankin (Stroke Patients Only)       Balance Overall balance assessment: Needs assistance         Standing balance support: No upper extremity supported Standing balance-Leahy Scale: Fair                               Pertinent Vitals/Pain Pain Assessment Pain Assessment: Faces Faces Pain Scale: Hurts little more Pain Location: abdomen Pain Descriptors / Indicators: Grimacing, Guarding Pain Intervention(s): Repositioned, Monitored during session    Home Living Family/patient expects to  be discharged to:: Private residence Living Arrangements: Children Available Help at Discharge: Family;Available PRN/intermittently Type of Home: House Home Access: Stairs to enter Entrance Stairs-Rails: None Entrance Stairs-Number of Steps: 1   Home Layout: Able to live on main level with  bedroom/bathroom Home Equipment: BSC/3in1;Shower seat Additional Comments: Pts daughter lives with her    Prior Function Prior Level of Function : Independent/Modified Independent             Mobility Comments: doesn't use a anything inside (only sometimes will use SPC); uses a "stick" outside       Hand Dominance   Dominant Hand: Left    Extremity/Trunk Assessment        Lower Extremity Assessment Lower Extremity Assessment: Generalized weakness    Cervical / Trunk Assessment Cervical / Trunk Assessment: Normal  Communication   Communication: HOH  Cognition Arousal/Alertness: Awake/alert Behavior During Therapy: WFL for tasks assessed/performed Overall Cognitive Status: Within Functional Limits for tasks assessed                                          General Comments      Exercises     Assessment/Plan    PT Assessment Patient needs continued PT services  PT Problem List Decreased strength;Decreased activity tolerance;Decreased balance;Decreased mobility       PT Treatment Interventions Gait training;DME instruction;Therapeutic exercise;Balance training;Functional mobility training;Therapeutic activities;Patient/family education    PT Goals (Current goals can be found in the Care Plan section)  Acute Rehab PT Goals PT Goal Formulation: With patient Time For Goal Achievement: 06/11/21 Potential to Achieve Goals: Good    Frequency Min 3X/week     Co-evaluation               AM-PAC PT "6 Clicks" Mobility  Outcome Measure Help needed turning from your back to your side while in a flat bed without using bedrails?: A Little Help needed moving from lying on your back to sitting on the side of a flat bed without using bedrails?: A Little Help needed moving to and from a bed to a chair (including a wheelchair)?: A Little Help needed standing up from a chair using your arms (e.g., wheelchair or bedside chair)?: A Little Help  needed to walk in hospital room?: A Little Help needed climbing 3-5 steps with a railing? : A Little 6 Click Score: 18    End of Session Equipment Utilized During Treatment: Gait belt Activity Tolerance: Patient tolerated treatment well Patient left: in bed;with call bell/phone within reach;with bed alarm set Nurse Communication: Mobility status PT Visit Diagnosis: Difficulty in walking, not elsewhere classified (R26.2)    Time: 6945-0388 PT Time Calculation (min) (ACUTE ONLY): 15 min   Charges:   PT Evaluation $PT Eval Low Complexity: 1 Low        Kati PT, DPT Acute Rehabilitation Services Pager: [phone] Office: [place] 05/28/2021, 6:52 PM

## 2021-05-28 NOTE — Evaluation (Signed)
Occupational Therapy Evaluation Patient Details Name: Brooke Beasley MRN: [id number] DOB: [birth year] Today's Date: 05/28/2021   History of Present Illness Patient admitted with complete rectal prolaspse and is now ROBOTIC LOW ANTERIOR RECTOSIGMOID RESECTION   Clinical Impression   Brooke Beasley is an 88 year old woman who presents with decreased activity tolerance and pain s/p abdominal surgery resulting in a sudden decline in patient's ability to perform ADLs. Patient currently needing increased assistance for all ADLs and min guard with RW to transfer. Patient able to stand and transfer to [facility] and does with well with mobility despite discomfort. Patient also limited by IV line and IV board in left dominant upper extremity. Patient will benefit from skilled OT services while in hospital to improve deficits and learn compensatory strategies as needed in order to return to PLOF.        Recommendations for follow up therapy are one component of a multi-disciplinary discharge planning process, led by the attending physician.  Recommendations may be updated based on patient status, additional functional criteria and insurance authorization.   Follow Up Recommendations  Home health OT    Assistance Recommended at Discharge Intermittent Supervision/Assistance  Patient can return home with the following A little help with walking and/or transfers;A lot of help with bathing/dressing/bathroom    Functional Status Assessment  Patient has had a recent decline in their functional status and demonstrates the ability to make significant improvements in function in a reasonable and predictable amount of time.  Equipment Recommendations  None recommended by OT    Recommendations for Other Services       Precautions / Restrictions Precautions Precaution Comments: abdominal drain Restrictions Weight Bearing Restrictions: No      Mobility Bed Mobility                     Transfers                          Balance Overall balance assessment: Mild deficits observed, not formally tested                                         ADL either performed or assessed with clinical judgement   ADL Overall ADL's : Needs assistance/impaired Eating/Feeding: Independent   Grooming: Set up;Sitting   Upper Body Bathing: Sitting;Minimal assistance   Lower Body Bathing: Moderate assistance;Sit to/from stand   Upper Body Dressing : Moderate assistance;Sitting   Lower Body Dressing: Maximal assistance;Sit to/from stand   Toilet Transfer: Min guard;Rolling walker (2 wheels);BSC/3in1   Toileting- Water quality scientist and Hygiene: Maximal assistance;Sit to/from stand       Functional mobility during ADLs: Min guard;Rolling walker (2 wheels) General ADL Comments: Patient overall min guard with RW to transfer and stand. Patient significantly limited by pain in abdomen, multiple IV sites in both arms, IV board on left wrist.     Vision Patient Visual Report: No change from baseline       Perception     Praxis      Pertinent Vitals/Pain Pain Assessment Pain Assessment: Faces Faces Pain Scale: Hurts little more Pain Location: abdomen Pain Descriptors / Indicators: Grimacing Pain Intervention(s): Limited activity within patient's tolerance     Hand Dominance Left   Extremity/Trunk Assessment Upper Extremity Assessment Upper Extremity Assessment: Overall WFL for tasks assessed (difficult to assess due  to multiple IV lines and left wrist and forearm stabilized by a board, hx of rotator cuff injury and has decresaed shoulder ROM on left)   Lower Extremity Assessment Lower Extremity Assessment: Defer to PT evaluation   Cervical / Trunk Assessment Cervical / Trunk Assessment: Normal   Communication Communication Communication: HOH   Cognition Arousal/Alertness: Awake/alert Behavior During Therapy: WFL for tasks  assessed/performed Overall Cognitive Status: Within Functional Limits for tasks assessed                                       General Comments       Exercises     Shoulder Instructions      Home Living Family/patient expects to be discharged to:: Private residence Living Arrangements: Children Available Help at Discharge: Family;Available PRN/intermittently Type of Home: House Home Access: Stairs to enter Entrance Stairs-Number of Steps: 1   Home Layout: Able to live on main level with bedroom/bathroom     Bathroom Shower/Tub: Teacher, early years/pre: Handicapped height     Home Equipment: BSC/3in1;Shower seat   Additional Comments: Pts daughter lives with her      Prior Functioning/Environment Prior Level of Function : Independent/Modified Independent             Mobility Comments: uses a cane or a stick PRN          OT Problem List: Decreased activity tolerance;Impaired balance (sitting and/or standing);Pain;Impaired UE functional use      OT Treatment/Interventions: Self-care/ADL training;DME and/or AE instruction;Therapeutic activities;Balance training;Patient/family education    OT Goals(Current goals can be found in the care plan section) Acute Rehab OT Goals Patient Stated Goal: be independent again OT Goal Formulation: With patient Time For Goal Achievement: 06/11/21 Potential to Achieve Goals: Good  OT Frequency: Min 2X/week    Co-evaluation              AM-PAC OT "6 Clicks" Daily Activity     Outcome Measure Help from another person eating meals?: A Little Help from another person taking care of personal grooming?: A Little Help from another person toileting, which includes using toliet, bedpan, or urinal?: A Lot Help from another person bathing (including washing, rinsing, drying)?: A Lot Help from another person to put on and taking off regular upper body clothing?: A Lot Help from another person to put on  and taking off regular lower body clothing?: A Lot 6 Click Score: 14   End of Session Equipment Utilized During Treatment: Rolling walker (2 wheels) Nurse Communication: Mobility status  Activity Tolerance: Patient tolerated treatment well Patient left: in chair;with family/visitor present  OT Visit Diagnosis: Pain                Time: 2878-6767 OT Time Calculation (min): 27 min Charges:  OT General Charges $OT Visit: 1 Visit OT Evaluation $OT Eval Low Complexity: 1 Low OT Treatments $Self Care/Home Management : 8-22 mins  Kaleth Koy, OTR/L [place]  Office [phone] Pager: [phone]   Lenward Chancellor 05/28/2021, 1:31 PM

## 2021-05-29 ENCOUNTER — Other Ambulatory Visit (HOSPITAL_COMMUNITY): Payer: Self-pay

## 2021-05-29 LAB — POTASSIUM: Potassium: 4.8 mmol/L (ref 3.5–5.1)

## 2021-05-29 LAB — HEMOGLOBIN: Hemoglobin: 8.1 g/dL — ABNORMAL LOW (ref 12.0–15.0)

## 2021-05-29 MED ORDER — HYDROCODONE-ACETAMINOPHEN 5-325 MG PO TABS
1.0000 | ORAL_TABLET | Freq: Four times a day (QID) | ORAL | 0 refills | Status: DC | PRN
Start: 2021-05-29 — End: 2021-07-13
  Filled 2021-05-29: qty 20, 5d supply, fill #0

## 2021-05-29 MED ORDER — GABAPENTIN 100 MG PO CAPS
200.0000 mg | ORAL_CAPSULE | Freq: Two times a day (BID) | ORAL | 1 refills | Status: DC
  Filled 2021-05-29: qty 40, 10d supply, fill #0

## 2021-05-29 MED ORDER — BISMUTH SUBSALICYLATE 262 MG/15ML PO SUSP
30.0000 mL | Freq: Once | ORAL | Status: AC
Start: 2021-05-29 — End: 2021-05-29
  Administered 2021-05-29: 30 mL via ORAL
  Filled 2021-05-29: qty 236

## 2021-05-29 MED ORDER — POTASSIUM CHLORIDE CRYS ER 20 MEQ PO TBCR
40.0000 meq | EXTENDED_RELEASE_TABLET | Freq: Every day | ORAL | Status: DC
  Administered 2021-05-29 – 2021-05-30 (×2): 40 meq via ORAL
  Filled 2021-05-29 (×3): qty 2

## 2021-05-29 MED ORDER — CALCIUM POLYCARBOPHIL 625 MG PO TABS
625.0000 mg | ORAL_TABLET | Freq: Every day | ORAL | Status: DC
  Administered 2021-05-29 – 2021-05-30 (×2): 625 mg via ORAL
  Filled 2021-05-29 (×2): qty 1

## 2021-05-29 NOTE — TOC Transition Note (Signed)
Transition of Care [facility]) - CM/SW Discharge Note   Patient Details  Name: Brooke Beasley MRN: [id number] Date of Birth: 1931-02-03  Transition of [facility]) CM/SW Contact:  Lennart Pall, LCSW Phone Number: 05/29/2021, 11:34 AM   Clinical Narrative:    Orders placed for DME and HH follow up.  Met with pt and daughter who are aware and agreeable.  No agency preferences.  RW and 3n1 arranged via [employer] and HHPT/OT via [place].  No further TOC needs - will sign off.   Final next level of care: [place] Barriers to Discharge: Continued Medical Work up   Patient Goals and CMS Choice Patient states their goals for this hospitalization and ongoing recovery are:: return home      Discharge Placement                       Discharge Plan and Services                DME Arranged: 3-N-1, Walker rolling DME Agency: [employer] Date DME Agency Contacted: 05/29/21 Time DME Agency Contacted: 1132 Representative spoke with at DME Agency: [place]: PT, OT HH Agency: [place] Date [place]: 05/29/21 Time [place]: 50 Representative spoke with at [place]: [place] ([place]) Interventions     Readmission Risk Interventions Readmission Risk Prevention Plan 05/29/2021  Transportation Screening Complete  PCP or Specialist Appt within 3-5 Days Complete  HRI or Home Care Consult Complete  Palliative Care Screening Not Applicable  Some recent data might be hidden

## 2021-05-29 NOTE — Plan of Care (Signed)
  Problem: Clinical Measurements: Goal: Ability to maintain clinical measurements within normal limits will improve Outcome: Progressing   

## 2021-05-29 NOTE — Progress Notes (Signed)
Physical Therapy Treatment Patient Details Name: Brooke Beasley MRN: [id number] DOB: 07-03-1930 Today's Date: 05/29/2021   History of Present Illness Patient is an 88 year old female admitted 05/26/21 with complete rectal prolaspse and is now ROBOTIC LOW ANTERIOR RECTOSIGMOID RESECTION    PT Comments    Pt making good progress.  Motivated and with good pain control.  Does get fatigued and with DOE but still ambulating household distances and has family support.  Continue plan of care.    Recommendations for follow up therapy are one component of a multi-disciplinary discharge planning process, led by the attending physician.  Recommendations may be updated based on patient status, additional functional criteria and insurance authorization.  Follow Up Recommendations  Home health PT     Assistance Recommended at Discharge Intermittent Supervision/Assistance  Patient can return home with the following A little help with walking and/or transfers;Help with stairs or ramp for entrance;A little help with bathing/dressing/bathroom   Equipment Recommendations  Rolling walker (2 wheels)    Recommendations for Other Services       Precautions / Restrictions Precautions Precautions: Fall     Mobility  Bed Mobility Overal bed mobility: Needs Assistance Bed Mobility: Rolling, Sidelying to Sit Rolling: Supervision Sidelying to sit: Supervision       General bed mobility comments: Used bed rails and log roll technique (has a bed rail at home)    Transfers Overall transfer level: Needs assistance Equipment used: Rolling walker (2 wheels) Transfers: Sit to/from Stand Sit to Stand: Min guard, Supervision           General transfer comment: Min guard initially progressing to supervision.  Stood from bed, chair, and toilet    Ambulation/Gait Ambulation/Gait assistance: Counsellor (Feet): 200 Feet Assistive device: Rolling walker (2 wheels) Gait  Pattern/deviations: Step-through pattern, Decreased stride length Gait velocity: decreased     General Gait Details: 1 standing rest break; min guard for safety   Stairs             Wheelchair Mobility    Modified Rankin (Stroke Patients Only)       Balance Overall balance assessment: Needs assistance Sitting-balance support: No upper extremity supported Sitting balance-Leahy Scale: Good     Standing balance support: No upper extremity supported, Bilateral upper extremity supported Standing balance-Leahy Scale: Fair Standing balance comment: RW to ambulate but stood for toielting ADLs wtihout support and performed ADLs with min guard for safety                            Cognition Arousal/Alertness: Awake/alert Behavior During Therapy: WFL for tasks assessed/performed Overall Cognitive Status: Within Functional Limits for tasks assessed                                          Exercises      General Comments General comments (skin integrity, edema, etc.): Pt with DOE of 3/4 with ambulation but sats 93% and HR 80's.      Pertinent Vitals/Pain Pain Assessment Pain Assessment: Faces Faces Pain Scale: Hurts a little bit Pain Location: abdomen with coughing and transfers Pain Descriptors / Indicators: Grimacing, Guarding Pain Intervention(s): Limited activity within patient's tolerance, Monitored during session    Home Living  Prior Function            PT Goals (current goals can now be found in the care plan section) Progress towards PT goals: Progressing toward goals    Frequency    Min 3X/week      PT Plan Current plan remains appropriate    Co-evaluation              AM-PAC PT "6 Clicks" Mobility   Outcome Measure  Help needed turning from your back to your side while in a flat bed without using bedrails?: A Little Help needed moving from lying on your back to sitting on  the side of a flat bed without using bedrails?: A Little Help needed moving to and from a bed to a chair (including a wheelchair)?: A Little Help needed standing up from a chair using your arms (e.g., wheelchair or bedside chair)?: A Little Help needed to walk in hospital room?: A Little Help needed climbing 3-5 steps with a railing? : A Little 6 Click Score: 18    End of Session   Activity Tolerance: Patient tolerated treatment well Patient left: in chair;with call bell/phone within reach;with family/visitor present Nurse Communication: Mobility status PT Visit Diagnosis: Difficulty in walking, not elsewhere classified (R26.2)     Time: 2202-5427 PT Time Calculation (min) (ACUTE ONLY): 23 min  Charges:  $Gait Training: 8-22 mins $Therapeutic Activity: 8-22 mins                     Abran Richard, PT Acute Rehab Services Pager [phone] [facility] [place] 05/29/2021, 1:43 PM

## 2021-05-29 NOTE — Progress Notes (Signed)
Brooke Beasley [id number] December 20, 1930  CARE TEAM:  PCP: [name], MD  Outpatient Care Team: Patient Care Team: [name], MD as PCP - General (Family Medicine) Kate Sable, MD as PCP - Cardiology (Cardiology) Dannielle Karvonen, RN as [place] [facility] Maryon Kemnitz, Remo Lipps, MD as Consulting Physician (General Surgery) Lin Landsman, MD as Consulting Physician (Gastroenterology) Kate Sable, MD as Consulting Physician (Cardiology)  Inpatient Treatment Team: Treatment Team: Attending Provider: Michael Boston, MD; Registered Nurse: Claud Kelp, RN; Registered Nurse: Verda Cumins, RN; Utilization Review: Lacretia Leigh, RN; Technician: Leda Quail, NT; Registered Nurse: Charlyne Petrin, RN; Registered Nurse: Jodi Marble, RN; Physical Therapist: Karlton Lemon, PT   Problem List:   Principal Problem:   Complete rectal prolapse Active Problems:   Hypothyroidism   HLD (hyperlipidemia)   Glaucoma   Hypertension, essential   Irritable bowel syndrome with constipation   Anxiety associated with depression   GERD (gastroesophageal reflux disease)   Insomnia   Dizziness   Restless legs   CKD (chronic kidney disease) stage 3, GFR 30-59 ml/min (HCC)   Iron deficiency anemia   Pruritus   3 Days Post-Op  05/26/2021  PPOST-OPERATIVE DIAGNOSIS: RECTAL PROLAPSE   PROCEDURE:  -ROBOTIC LOW ANTERIOR RECTOSIGMOID RESECTION -RECTOPEXY -INTRAOPERATIVE ASSESSMENT OF TISSUE VASCULAR PERFUSION USING ICG (indocyanine green) IMMUNOFLUORESCENCE -TRANSVERSUS ABDOMINIS PLANE (TAP) BLOCK - BILATERAL -RIGID PROCTOSCOPY -ANORECTAL EUA   SURGEON:  Adin Hector, MD  OR FINDINGS: Very redundant sigmoid colon.  Circumferential prolapse of distal and mid rectum circumferentially with irritated hemorrhoids.  About 45 cm redundant colon resected from mid descending to proximal rectum.  Suture rectopexy done.  Patient  has a 81 EEA end mid descending colon-to-end mid rectum stapled anastomosis.   At rest 10 cm from the anal verge.  Irritated internal hemorrhoids but grade 2 after prolapse corrected.  Held off on more aggressive resection/ligation/pexy.  Suspect hemorrhoid bleeding from chronic prolapse irritation.    Assessment  Stabilizing  [facility] Stay = 3 days)  Plan:  Hypokalemia -better replaced.  We will keep on daily potassium given recent Lasix and usual potassium supplementation.  Recheck labs tomorrow.  -Chronic hypertension.  Hold hypertensive meds.  As needed for breakthrough hypertension  Acute postoperative anemia on top of chronic iron deficiency anemia from chronic blood loss from prolapse of rectum and hemorrhoids.  Received IV iron 2/1.  Keep on the dry side.  Oral iron/vitamin C to help.  Hold off on transfusing since not symptomatic and no longer on Neo-Synephrine.  Hemoglobin stable now.  Restart enoxaparin and follow  -Improved pain control with scheduled Tylenol and low-dose gabapentin.  Switched to Vicodin.  As needed as needed.  Follow.  Enhance recovery protocol.  -Advance diet per enhance recovery protocol.  Solid diet.      Bowel regimen with fiber.  Add oral iron.  Pepto-Bismol x1.  Try and hold off on Imodium unless more severe  Will place on scheduled famotidine since I think that is her preferred as needed medication at home.  Can have Maalox for breakthrough as well.  -VTE prophylaxis- SCDs, etc. full enoxaparin for 24 hours given acute blood loss anemia  -mobilize as tolerated to help recovery -try and get her up to chair at least and follow.  Will establish PT and OT consultations .  They recommend home health.  We will set that up.  Make sure they have no equipment.  Patient's daughter notes needs a rolling walker.  Most likely will need a tub seat as well.  -Okay to transfer to floor.  Surgical floor.  Disposition:  Disposition:  The patient is from:  Home  Anticipate discharge to:   [facility]  Anticipated Date of Discharge is:  February 4,2023    Barriers to discharge:  Pending Clinical improvement (more likely than not)  Patient currently is NOT MEDICALLY STABLE for discharge from the hospital from a surgery standpoint.      30 minutes spent in review, evaluation, examination, counseling, and coordination of care.   I have reviewed this patient's available data, including medical history, events of note, physical examination and test results as part of my evaluation.  A significant portion of that time was spent in counseling.  Care during the described time interval was provided by me.  05/29/2021    Subjective: (Chief complaint)  Patient feeling much better overall.  Still with some frequent bowel movements.  Pain better controlled.  Daughter in room.  Nursing just outside room.  Objective:  Vital signs:  Vitals:   05/29/21 0147 05/29/21 0440 05/29/21 0500 05/29/21 0651  BP: (!) 157/72 (!) 174/64  (!) 154/82  Pulse: 89 85  83  Resp: 19 19    Temp: 98.7 F (37.1 C) 98.3 F (36.8 C)    TempSrc: Oral Oral    SpO2: 94% 97%    Weight:   73.9 kg   Height:        Last BM Date: 05/28/21  Intake/Output   Yesterday:  02/02 0701 - 02/03 0700 In: 470 [P.O.:420; IV Piggyback:50] Out: 530 [Urine:500; Drains:30] This shift:  No intake/output data recorded.  Bowel function:  Flatus: YES  BM:  YES  Drain: Serosanguinous   Physical Exam:  General: Pt awake/alert in no acute distress.  Bright and alert.  At sink washing hands.  Daughter with her.  Transferred to bedside commode moving more easily now  eyes: PERRL, normal EOM.  Sclera clear.  No icterus Neuro: CN II-XII intact w/o focal sensory/motor deficits. Lymph: No head/neck/groin lymphadenopathy Psych:  No delerium/psychosis/paranoia.  Oriented x 4 HENT: Normocephalic, Mucus membranes moist.  No thrush.  Remains severely hard of hearing. Neck: Supple, No  tracheal deviation.  No obvious thyromegaly Chest: No pain to chest wall compression.  Good respiratory excursion.  No audible wheezing CV:  Pulses intact.  Regular rhythm.  No major extremity edema MS: Normal AROM mjr joints.  No obvious deformity  Abdomen: Soft.  Nondistended.  Mildly tender at incisions only.  Improved.  No evidence of peritonitis.  No incarcerated hernias.  Ext:   No deformity.  No mjr edema.  No cyanosis Skin: No petechiae / purpurea.  No major sores.  Warm and dry    Results:   Cultures: Recent Results (from the past 720 hour(s))  SARS CORONAVIRUS 2 (TAT 6-24 HRS) Nasopharyngeal Nasopharyngeal Swab     Status: None   Collection Time: 05/22/21 10:08 AM   Specimen: Nasopharyngeal Swab  Result Value Ref Range Status   SARS Coronavirus 2 NEGATIVE NEGATIVE Final    Comment: (NOTE) SARS-CoV-2 target nucleic acids are NOT DETECTED.  The SARS-CoV-2 RNA is generally detectable in upper and lower respiratory specimens during the acute phase of infection. Negative results do not preclude SARS-CoV-2 infection, do not rule out co-infections with other pathogens, and should not be used as the sole basis for treatment or other patient management decisions. Negative results must be combined with clinical observations, patient history, and epidemiological information. The  expected result is Negative.  Fact Sheet for Patients: SugarRoll.be  Fact Sheet for Healthcare Providers: https://www.woods-mathews.com/  This test is not yet approved or cleared by the [place] FDA and  has been authorized for detection and/or diagnosis of SARS-CoV-2 by FDA under an Emergency Use Authorization (EUA). This EUA will remain  in effect (meaning this test can be used) for the duration of the COVID-19 declaration under Se ction 564(b)(1) of the Act, 21 U.S.C. section 360bbb-3(b)(1), unless the authorization is terminated or revoked  sooner.  Performed at [facility], [place] [address] [place], [place] [address]   MRSA Next Gen by PCR, Nasal     Status: None   Collection Time: 05/27/21  2:56 AM   Specimen: Nasal Mucosa; Nasal Swab  Result Value Ref Range Status   MRSA by PCR Next Gen NOT DETECTED NOT DETECTED Final    Comment: (NOTE) The GeneXpert MRSA Assay (FDA approved for NASAL specimens only), is one component of a comprehensive MRSA colonization surveillance program. It is not intended to diagnose MRSA infection nor to guide or monitor treatment for MRSA infections. Test performance is not FDA approved in patients less than 56 years old. Performed at [facility], [place] [address] [place], [place] [address]     Labs: Results for orders placed or performed during the hospital encounter of 05/26/21 (from the past 48 hour(s))  CBC     Status: Abnormal   Collection Time: 05/28/21  2:37 AM  Result Value Ref Range   WBC 7.1 4.0 - 10.5 K/uL   RBC 2.37 (L) 3.87 - 5.11 MIL/uL   Hemoglobin 7.1 (L) 12.0 - 15.0 g/dL    Comment: REPEATED TO VERIFY   HCT 21.8 (L) 36.0 - 46.0 %   MCV 92.0 80.0 - 100.0 fL   MCH 30.0 26.0 - 34.0 pg   MCHC 32.6 30.0 - 36.0 g/dL   RDW 15.3 11.5 - 15.5 %   Platelets 170 150 - 400 K/uL   nRBC 0.0 0.0 - 0.2 %    Comment: Performed at [facility], [place] [address], [place], [place] [address]  Magnesium     Status: None   Collection Time: 05/28/21  2:37 AM  Result Value Ref Range   Magnesium 1.7 1.7 - 2.4 mg/dL    Comment: Performed at [facility], [place] [address] [place], [place] [address]  Potassium     Status: Abnormal   Collection Time: 05/28/21  2:37 AM  Result Value Ref Range   Potassium 3.4 (L) 3.5 - 5.1 mmol/L    Comment: DELTA CHECK NOTED Performed at [facility], [place] [address] [place], [place] [address]   Creatinine, serum     Status: Abnormal   Collection Time: 05/28/21  2:37 AM  Result  Value Ref Range   Creatinine, Ser 0.96 0.44 - 1.00 mg/dL   GFR, Estimated 57 (L) >60 mL/min    Comment: (NOTE) Calculated using the CKD-EPI Creatinine Equation (2021) Performed at [facility], [place] [address] [place], [place] [address]   CBC     Status: Abnormal   Collection Time: 05/28/21  6:00 AM  Result Value Ref Range   WBC 7.4 4.0 - 10.5 K/uL   RBC 2.28 (L) 3.87 - 5.11 MIL/uL   Hemoglobin 6.9 (LL) 12.0 - 15.0 g/dL    Comment: REPEATED TO VERIFY THIS CRITICAL RESULT HAS VERIFIED AND BEEN CALLED TO Jari Favre, C. RN BY NICOLE  MCCOY ON 02 02 2023 AT 0733, AND HAS BEEN READ BACK. CRITICAL RESULT VERIFIED    HCT 20.6 (L) 36.0 - 46.0 %   MCV 90.4 80.0 - 100.0 fL   MCH 30.3 26.0 - 34.0 pg   MCHC 33.5 30.0 - 36.0 g/dL   RDW 15.3 11.5 - 15.5 %   Platelets 173 150 - 400 K/uL   nRBC 0.0 0.0 - 0.2 %    Comment: Performed at [facility], [place] [address] [place], [place] [address]  Magnesium     Status: None   Collection Time: 05/28/21  6:00 AM  Result Value Ref Range   Magnesium 1.7 1.7 - 2.4 mg/dL    Comment: Performed at [facility], [place] [address] [place], [place] [address]  Potassium     Status: Abnormal   Collection Time: 05/28/21  6:00 AM  Result Value Ref Range   Potassium 3.4 (L) 3.5 - 5.1 mmol/L    Comment: Performed at [facility], [place] [address], [place], [place] [address]  Creatinine, serum     Status: None   Collection Time: 05/28/21  6:00 AM  Result Value Ref Range   Creatinine, Ser 0.75 0.44 - 1.00 mg/dL   GFR, Estimated >60 >60 mL/min    Comment: (NOTE) Calculated using the CKD-EPI Creatinine Equation (2021) Performed at [facility], [place] [address] [place], [place] [address]   Hemoglobin     Status: Abnormal   Collection Time: 05/29/21  4:16 AM  Result Value Ref Range   Hemoglobin 8.1 (L) 12.0 - 15.0 g/dL    Comment: Performed at [facility],  [place] [address] [place], [place] [address]  Potassium     Status: None   Collection Time: 05/29/21  4:16 AM  Result Value Ref Range   Potassium 4.8 3.5 - 5.1 mmol/L    Comment: DELTA CHECK NOTED Performed at [place] [address], [place], [place] [address]     Imaging / Studies: No results found.  Medications / Allergies: per chart  Antibiotics: Anti-infectives (From admission, onward)    Start     Dose/Rate Route Frequency Ordered Stop   05/27/21 0200  cefoTEtan (CEFOTAN) 2 g in sodium chloride 0.9 % 100 mL IVPB        2 g 200 mL/hr over 30 Minutes Intravenous Every 12 hours 05/26/21 2137 05/27/21 0407   05/26/21 1400  neomycin (MYCIFRADIN) tablet 1,000 mg  Status:  Discontinued       See Hyperspace for full Linked Orders Report.   1,000 mg Oral 3 times per day 05/26/21 1109 05/26/21 1119   05/26/21 1400  metroNIDAZOLE (FLAGYL) tablet 1,000 mg  Status:  Discontinued       See Hyperspace for full Linked Orders Report.   1,000 mg Oral 3 times per day 05/26/21 1109 05/26/21 1119   05/26/21 1115  cefoTEtan (CEFOTAN) 2 g in sodium chloride 0.9 % 100 mL IVPB        2 g 200 mL/hr over 30 Minutes Intravenous On call to O.R. 05/26/21 1109 05/26/21 1435         Note: Portions of this report may have been transcribed using voice recognition software. Every effort was made to ensure accuracy; however, inadvertent computerized transcription errors may be present.   Any transcriptional errors that result from this process are unintentional.    Adin Hector, MD, FACS, MASCRS Esophageal, Gastrointestinal & Colorectal Surgery  Robotic and Minimally Invasive Surgery  [facility], [place]. [address], [place], [place] [address] [phone] Fax [phone] Main  CONTACT INFORMATION:  Weekday (9AM-5PM): Call [facility] main office at [phone]  Weeknight (5PM-9AM) or  Weekend/Holiday: Check [email or web address] (password " [email or web address]") for General Surgery [facility] coverage  (Please, do not use SecureChat as it is not reliable communication to operating surgeons for immediate patient care)      05/29/2021  7:55 AM

## 2021-05-30 ENCOUNTER — Encounter: Payer: Self-pay | Admitting: Family Medicine

## 2021-05-30 ENCOUNTER — Other Ambulatory Visit (HOSPITAL_COMMUNITY): Payer: Self-pay

## 2021-05-30 LAB — CREATININE, SERUM
Creatinine, Ser: 0.79 mg/dL (ref 0.44–1.00)
GFR, Estimated: >60 mL/min (ref >60)

## 2021-05-30 LAB — HEMOGLOBIN: Hemoglobin: 7.6 g/dL — ABNORMAL LOW (ref 12.0–15.0)

## 2021-05-30 LAB — POTASSIUM: Potassium: 4.6 mmol/L (ref 3.5–5.1)

## 2021-05-30 MED ORDER — FLUCONAZOLE 100 MG PO TABS
200.0000 mg | ORAL_TABLET | Freq: Every day | ORAL | Status: DC
  Administered 2021-05-30: 200 mg via ORAL
  Filled 2021-05-30: qty 2

## 2021-05-30 NOTE — Discharge Summary (Signed)
Physician Discharge Summary    Patient ID: Brooke Beasley MRN: [id number] DOB/AGE: 11-10-1930  88 y.o.  Patient Care Team: [name], MD as PCP - General (Family Medicine) Kate Sable, MD as PCP - Cardiology (Cardiology) Dannielle Karvonen, RN as [place] [facility] Eilam Shrewsbury, Remo Lipps, MD as Consulting Physician (General Surgery) Lin Landsman, MD as Consulting Physician (Gastroenterology) Kate Sable, MD as Consulting Physician (Cardiology)  Admit date: 05/26/2021  Discharge date: 05/30/2021  Hospital Stay = 4 days    Discharge Diagnoses:  Principal Problem:   Complete rectal prolapse Active Problems:   Hypothyroidism   HLD (hyperlipidemia)   Glaucoma   Hypertension, essential   Irritable bowel syndrome with constipation   Anxiety associated with depression   GERD (gastroesophageal reflux disease)   Insomnia   Dizziness   Restless legs   CKD (chronic kidney disease) stage 3, GFR 30-59 ml/min (HCC)   Iron deficiency anemia   Pruritus   4 Days Post-Op  05/26/2021  POST-OPERATIVE DIAGNOSIS:   RECTAL PROLAPSE  SURGERY:  05/26/2021  Procedure(s): ROBOTIC LOW ANTERIOR RECTOSIGMOID RESECTION; ASSESSMENT OF TISSUE PERFUSION WITH FIREFLY RECTOPEXY RIGID PROCTOSCOPY  SURGEON:    Surgeon(s): Michael Boston, MD Greer Pickerel, MD  Consults: Physical Therapy, Occupational Therapy, and Case Management / Social Work  Hospital Course:   The patient underwent the surgery above.  Postoperatively, the patient gradually mobilized and advanced to a solid diet.  She did have some postoperative anemia that stabilized.  Prophylactic enoxaparin held for 24 hours.  An IV and oral iron.  Pain and other symptoms were treated aggressively.    By the time of discharge, the patient was walking well the hallways, eating food, having flatus.  Pain was well-controlled on an oral medications.  Regimen of hydrocodone and gabapentin seem to work  well.  Physical and Occupational Therapy evaluate the patient it was felt she would benefit from home health.  Equipment ordered.  Daughter is very involved with her care.  Based on meeting discharge criteria and continuing to recover, I felt it was safe for the patient to be discharged from the hospital to further recover with close followup. Postoperative recommendations were discussed in detail.  They are written as well.  Discharged Condition: Stable.  Mildly deconditioned but otherwise good  Discharge Exam: Blood pressure (!) 174/67, pulse 80, temperature 98.6 F (37 C), temperature source Oral, resp. rate 15, height 5\' 1"  (1.549 m), weight 73.9 kg, SpO2 97 %.  General: Pt awake/alert/oriented x4 in No acute distress Eyes: PERRL, normal EOM.  Sclera clear.  No icterus Neuro: CN II-XII intact w/o focal sensory/motor deficits. Lymph: No head/neck/groin lymphadenopathy Psych:  No delerium/psychosis/paranoia HENT: Normocephalic, Mucus membranes moist.  No thrush Neck: Supple, No tracheal deviation Chest:  No chest wall pain w good excursion CV:  Pulses intact.  Regular rhythm MS: Normal AROM mjr joints.  No obvious deformity Abdomen: Soft.  Mildly distended.  Mildly tender at incisions only.  No evidence of peritonitis.  No incarcerated hernias. Ext:  SCDs BLE.  No mjr edema.  No cyanosis Skin: No petechiae / purpura   Disposition:    Follow-up Information     Michael Boston, MD Follow up in 3 week(s).   Specialties: General Surgery, Colon and Rectal Surgery Why: To follow up after your hospital stay, To follow up after your operation Contact information: [address] [place] [address] [phone]         [facility]  [facility].   Specialty: [place] Why: to provide home physical and occupational therapy Contact information: [place] [address] [phone]                 Discharge disposition:  01-Home or Self Care          Allergies as of 05/30/2021       Reactions   Amlodipine Other (See Comments)   Stomach pains--"feel like my insides are on fire"   Prednisone Other (See Comments)   Heart racing, anxiety --> clarified no anaphylaxis to prednisone. Tolerated prednisone 02/2019 without issue   Ace Inhibitors Cough   Carvedilol Other (See Comments)   fatigue   Losartan Other (See Comments)   Cr bumped   Other Other (See Comments)   No seeds or corn because of IBS   Paroxetine Hcl Other (See Comments)   Not effective - felt ill on this medicine   Risedronate Sodium    REACTION: joint pain   Simvastatin    REACTION: the full 20 mg pill causes leg pain- can tol 10 mg   Toprol Xl [metoprolol] Diarrhea   Diarrhea and weakness   Tramadol Other (See Comments)   Felt like passing out   Alendronate Sodium Palpitations   Influenza Vaccines Rash, Other (See Comments)   Led to mental breakdown in 1960s   Silenor [doxepin Hcl] Palpitations   Hallucinations and racing heart   Sulfonamide Derivatives Rash        Medication List     STOP taking these medications    hydrocortisone 25 MG suppository Commonly known as: ANUSOL-HC       TAKE these medications    acetaminophen 500 MG tablet Commonly known as: TYLENOL Take 500-1,000 mg by mouth every 6 (six) hours as needed for moderate pain. Depends on pain   albuterol 108 (90 Base) MCG/ACT inhaler Commonly known as: VENTOLIN HFA Inhale 2 puffs into the lungs every 6 (six) hours as needed for wheezing or shortness of breath.   Alphagan P 0.1 % Soln Generic drug: brimonidine Place 1 drop into both eyes in the morning.   BENEFIBER DRINK MIX PO Take 15 mLs by mouth daily.   bimatoprost 0.01 % Soln Commonly known as: LUMIGAN Place 1 drop into both eyes at bedtime.   cholecalciferol 1000 units tablet Commonly known as: VITAMIN D Take 1,000 Units by mouth daily.   clonazePAM 0.5 MG tablet Commonly known  as: KLONOPIN TAKE 1 TABLET BY MOUTH AT BEDTIME   colchicine 0.6 MG tablet Take 1 tablet (0.6 mg total) by mouth daily as needed (gout flare).   denosumab 60 MG/ML Sosy injection Commonly known as: PROLIA Inject 60 mg into the skin every 6 (six) months.   Dulcolax 5 MG EC tablet Generic drug: bisacodyl Take 1 tablet (5 mg total) by mouth daily as needed for moderate constipation.   furosemide 20 MG tablet Commonly known as: LASIX Take 20 mg by mouth daily as needed for edema or fluid.   Fusion Plus Caps Take 1 capsule by mouth daily.   gabapentin 100 MG capsule Commonly known as: NEURONTIN Take 2 capsules by mouth 2 times daily.   HYDROcodone-acetaminophen 5-325 MG tablet Commonly known as: NORCO/VICODIN Take 1 tablet by mouth every 6 hours as needed for moderate pain or severe pain.   hydrocortisone 2.5 % cream Place 1 application rectally daily as needed (hemorrhoids).   hydrOXYzine 25 MG tablet Commonly known as: ATARAX Take  1 tablet by mouth 2 times daily as needed for itching. What changed:  how much to take when to take this reasons to take this   irbesartan 150 MG tablet Commonly known as: Avapro Take 1 tablet (150 mg total) by mouth daily.   levothyroxine 100 MCG tablet Commonly known as: SYNTHROID Take 1 tablet by mouth once daily   MULTIVITAMIN ADULT PO Take by mouth daily.   omeprazole 20 MG capsule Commonly known as: PRILOSEC Take 20 mg by mouth daily as needed (heartburn).   ondansetron 4 MG tablet Commonly known as: ZOFRAN Take 1 tablet by mouth every 8 hours as needed for nausea or refractory nausea / vomiting. What changed: reasons to take this   OSTEO BI-FLEX TRIPLE STRENGTH PO Take 1 tablet by mouth daily.   PEPCID COMPLETE PO Take 1 tablet by mouth daily as needed (indigestion).   polyethylene glycol 17 g packet Commonly known as: MIRALAX / GLYCOLAX Take 17 g by mouth daily.   sertraline 100 MG tablet Commonly known as:  ZOLOFT Take 100 mg by mouth in the morning.   Simethicone 180 MG Caps Take 1 capsule by mouth daily as needed (indigestion).   spironolactone 25 MG tablet Commonly known as: ALDACTONE Take 1 tablet (25 mg total) by mouth daily.   traMADol 50 MG tablet Commonly known as: ULTRAM Take 50 mg by mouth every 6 (six) hours as needed for moderate pain.   Vitamin C Chew Chew 1 tablet by mouth daily.   Voltaren 1 % Gel Generic drug: diclofenac Sodium APPLY ONE APPLICATION TOPICALLY 3 TIMES DAILY What changed: See the new instructions.               Durable Medical Equipment  (From admission, onward)           Start     Ordered   05/29/21 0759  For home use only DME Shower stool  Once        05/29/21 0758   05/29/21 0758  For home use only DME Walker rolling  Once       Comments: To help patient transfer and ambulate.  Physical / Occupational Therapy may change type of walker PRN.  Question Answer Comment  Walker: With [place]   Patient needs a walker to treat with the following condition Unsteady gait when walking      05/29/21 0757              Discharge Care Instructions  (From admission, onward)           Start     Ordered   05/30/21 0000  Discharge wound care:       Comments: It is good for closed incisions and even open wounds to be washed every day.  Shower every day.  Short baths are fine.  Wash the incisions and wounds clean with soap & water.    You may leave closed incisions open to air if it is dry.   You may cover the incision with clean gauze & replace it after your daily shower for comfort.   05/30/21 0915            Significant Diagnostic Studies:  Results for orders placed or performed during the hospital encounter of 05/26/21 (from the past 72 hour(s))  CBC     Status: Abnormal   Collection Time: 05/28/21  2:37 AM  Result Value Ref Range   WBC 7.1 4.0 - 10.5 K/uL   RBC 2.37 (  L) 3.87 - 5.11 MIL/uL   Hemoglobin 7.1 (L)  12.0 - 15.0 g/dL    Comment: REPEATED TO VERIFY   HCT 21.8 (L) 36.0 - 46.0 %   MCV 92.0 80.0 - 100.0 fL   MCH 30.0 26.0 - 34.0 pg   MCHC 32.6 30.0 - 36.0 g/dL   RDW 15.3 11.5 - 15.5 %   Platelets 170 150 - 400 K/uL   nRBC 0.0 0.0 - 0.2 %    Comment: Performed at [facility], [place] [address] [place], [place] [address]  Magnesium     Status: None   Collection Time: 05/28/21  2:37 AM  Result Value Ref Range   Magnesium 1.7 1.7 - 2.4 mg/dL    Comment: Performed at [facility], [place] [address], [place], [place] [address]  Potassium     Status: Abnormal   Collection Time: 05/28/21  2:37 AM  Result Value Ref Range   Potassium 3.4 (L) 3.5 - 5.1 mmol/L    Comment: DELTA CHECK NOTED Performed at [facility], [place] [address], [place], [place] [address]   Creatinine, serum     Status: Abnormal   Collection Time: 05/28/21  2:37 AM  Result Value Ref Range   Creatinine, Ser 0.96 0.44 - 1.00 mg/dL   GFR, Estimated 57 (L) >60 mL/min    Comment: (NOTE) Calculated using the CKD-EPI Creatinine Equation (2021) Performed at [facility], [place] [address], [place], [place] [address]   CBC     Status: Abnormal   Collection Time: 05/28/21  6:00 AM  Result Value Ref Range   WBC 7.4 4.0 - 10.5 K/uL   RBC 2.28 (L) 3.87 - 5.11 MIL/uL   Hemoglobin 6.9 (LL) 12.0 - 15.0 g/dL    Comment: REPEATED TO VERIFY THIS CRITICAL RESULT HAS VERIFIED AND BEEN CALLED TO VARNER, C. RN BY NICOLE MCCOY ON 02 02 2023 AT 0733, AND HAS BEEN READ BACK. CRITICAL RESULT VERIFIED    HCT 20.6 (L) 36.0 - 46.0 %   MCV 90.4 80.0 - 100.0 fL   MCH 30.3 26.0 - 34.0 pg   MCHC 33.5 30.0 - 36.0 g/dL   RDW 15.3 11.5 - 15.5 %   Platelets 173 150 - 400 K/uL   nRBC 0.0 0.0 - 0.2 %    Comment: Performed at [facility], [place] [address], [place], [place] [address]  Magnesium     Status: None   Collection Time: 05/28/21  6:00 AM  Result Value  Ref Range   Magnesium 1.7 1.7 - 2.4 mg/dL    Comment: Performed at [facility], [place] [address], [place], [place] [address]  Potassium     Status: Abnormal   Collection Time: 05/28/21  6:00 AM  Result Value Ref Range   Potassium 3.4 (L) 3.5 - 5.1 mmol/L    Comment: Performed at [facility], [place] [address] [place], [place] [address]  Creatinine, serum     Status: None   Collection Time: 05/28/21  6:00 AM  Result Value Ref Range   Creatinine, Ser 0.75 0.44 - 1.00 mg/dL   GFR, Estimated >60 >60 mL/min    Comment: (NOTE) Calculated using the CKD-EPI Creatinine Equation (2021) Performed at [facility], [place] [address] [place],  [address]   Hemoglobin     Status: Abnormal   Collection Time: 05/29/21  4:16 AM  Result Value Ref Range   Hemoglobin 8.1 (L)  12.0 - 15.0 g/dL    Comment: Performed at [facility], [place] [address] [place], [place] [address]  Potassium     Status: None   Collection Time: 05/29/21  4:16 AM  Result Value Ref Range   Potassium 4.8 3.5 - 5.1 mmol/L    Comment: DELTA CHECK NOTED Performed at [place] [address], [place], [place] [address]   Hemoglobin     Status: Abnormal   Collection Time: 05/30/21  5:18 AM  Result Value Ref Range   Hemoglobin 7.6 (L) 12.0 - 15.0 g/dL    Comment: Performed at [facility], [place] [address], [place], [place] [address]  Potassium     Status: None   Collection Time: 05/30/21  5:18 AM  Result Value Ref Range   Potassium 4.6 3.5 - 5.1 mmol/L    Comment: Performed at [facility], [place] [address] [place], [place] [address]  Creatinine, serum     Status: None   Collection Time: 05/30/21  5:18 AM  Result Value Ref Range   Creatinine, Ser 0.79 0.44 - 1.00 mg/dL   GFR, Estimated >60 >60 mL/min    Comment: (NOTE) Calculated using the CKD-EPI Creatinine Equation (2021) Performed at  [facility], [place] [address] [place], [place] [address]     No results found.  Past Medical History:  Diagnosis Date   Anxiety    C. difficile diarrhea 07/11/2018   S/p multiple oral vanc treatments (course and tapers) and completed Zinplava monoclonal Ab treatment (05/10/2019) Fecal transplant on hold during COVID pandemic   CAP (community acquired pneumonia) 12/22/2017   Carotid stenosis 10/18/2014   R 1-39%, L 40-59%, rpt 1 yr (09/2014)    CKD (chronic kidney disease) stage 3, GFR 30-59 ml/min (HCC) 08/02/2014   Clostridioides difficile infection    hx 2020   COVID-19 virus infection 10/13/2020   Degenerative disc disease    LS   Depression    nervous breakdonw in 1964-out of work for a year   Diverticulosis    severe by colonoscopy   Family history of adverse reaction to anesthesia    n/v   GERD (gastroesophageal reflux disease)    Glaucoma    Heart murmur 08/2011   mitral regurge - on echo    History of hiatal hernia    History of shingles    HTN (hypertension)    Hyperlipidemia    Hypertension    Hypothyroidism    IBS 11/02/2006   Intestinal bacterial overgrowth    In small colon   Lower GI bleed 12/2020   thought diverticular complicated by ABLA with syncope and orbital fracture s/p hospitalization   Maxillary fracture ([place]) 04/08/2012   Osteoarthritis 2016   ([facility])   Osteoporosis    dexa 2011   Pseudogout 2016   shoulders ([place])    Past Surgical History:  Procedure Laterality Date   barium enema  2012   severe diverticulosis, redundant colon   Bowel obstruction  1999   no surgery in hosp x 3 days   COLONOSCOPY  1. 1999  2. 11/04   1. Not finished  2. Slight hemorrhage rectosigmoid area, severe sig diverticulosis   COLONOSCOPY WITH PROPOFOL N/A 09/10/2019   SSP with dysplasia, TA, rpt 3 yrs Marius Ditch, Tally Due, MD)   Dexa  1. [phone]   2. 9/04  3. 3/08    1. OP  2. OP, borderline, spine -2.44T  3. decreased BMD-OP  DG  KNEE 1-2 VIEWS BILAT     LS x-ray with degenerative disc and facet change   ESOPHAGOGASTRODUODENOSCOPY     Negative   FLEXIBLE SIGMOIDOSCOPY N/A 01/16/2021   Procedure: FLEXIBLE SIGMOIDOSCOPY;  Surgeon: Gatha Mayer, MD;  Location: [facility] ENDOSCOPY;  Service: Endoscopy;  Laterality: N/A;  or unsedated   Hemorrhoid procedure  07/2006   PROCTOSCOPY N/A 05/26/2021   Procedure: RIGID PROCTOSCOPY;  Surgeon: Michael Boston, MD;  Location: [facility] ORS;  Service: General;  Laterality: N/A;   RECTOPEXY N/A 05/26/2021   Procedure: RECTOPEXY;  Surgeon: Michael Boston, MD;  Location: [facility] ORS;  Service: General;  Laterality: N/A;   TONSILLECTOMY     TOTAL SHOULDER ARTHROPLASTY Left 11/27/2015   Corky Mull, MD   TOTAL SHOULDER REVISION Left 03/16/2016   Procedure: TOTAL SHOULDER REVISION;  Surgeon: Corky Mull, MD;  Location: [facility] ORS;  Service: Orthopedics;  Laterality: Left;   US ECHOCARDIOGRAPHY  99/3716   Normal systolic fxn with EF 96-78%.  Focal basal septal hypertrophy.  Mild diastolic dysfunction.  Mild MR.   XI ROBOTIC ASSISTED LOWER ANTERIOR RESECTION N/A 05/26/2021   Procedure: ROBOTIC LOW ANTERIOR RECTOSIGMOID RESECTION; ASSESSMENT OF TISSUE PERFUSION WITH FIREFLY;  Surgeon: Michael Boston, MD;  Location: [facility] ORS;  Service: General;  Laterality: N/A;    Social History   Socioeconomic History   Marital status: Widowed    Spouse name: Not on file   Number of children: 2   Years of education: Not on file   Highest education level: Not on file  Occupational History   Occupation: Retired    Fish farm manager: RETIRED  Tobacco Use   Smoking status: Never   Smokeless tobacco: Never  Vaping Use   Vaping Use: Never used  Substance and Sexual Activity   Alcohol use: No   Drug use: No   Sexual activity: Not Currently  Other Topics Concern   Not on file  Social History Narrative   Left handed   Widow. Husband ([name]) deceased 03/30/16 from dementia. She was caregiver.    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   Activity: no regular exercise   Diet: good water, fruits/vegetables daily   Social Determinants of Radio broadcast assistant Strain: Low Risk    Difficulty of Paying Living Expenses: Not very hard  Food Insecurity: No Food Insecurity   Worried About Charity fundraiser in the Last Year: Never true   Arboriculturist in the Last Year: Never true  Transportation Needs: No Transportation Needs   Lack of Transportation (Medical): No   Lack of Transportation (Non-Medical): No  Physical Activity: Insufficiently Active   Days of Exercise per Week: 7 days   Minutes of Exercise per Session: 10 min  Stress: No Stress Concern Present   Feeling of Stress : Only a little  Social Connections: Socially Isolated   Frequency of Communication with Friends and Family: More than three times a week   Frequency of Social Gatherings with Friends and Family: Three times a week   Attends Religious Services: Never   Active Member of Clubs or Organizations: No   Attends Archivist Meetings: Never   Marital Status: Widowed  Human resources officer Violence: Not At Risk   Fear of Current or Ex-Partner: No   Emotionally Abused: No   Physically Abused: No   Sexually Abused: No    Family History  Problem Relation Age of Onset   Diabetes Brother        post-op   Coronary artery disease Brother    Diabetes Paternal Grandfather    Breast cancer Cousin     Current Facility-Administered Medications  Medication Dose Route Frequency Provider Last Rate Last Admin   0.9 %  sodium chloride infusion  250 mL Intravenous PRN Michael Boston, MD       acetaminophen (TYLENOL) tablet 500 mg  500 mg Oral TID WC & HS Michael Boston, MD   500 mg at 05/30/21 0811   albuterol (PROVENTIL) (2.5 MG/3ML) 0.083% nebulizer solution 2.5 mg  2.5 mg Inhalation Q6H PRN Michael Boston, MD       alum & mag hydroxide-simeth (MAALOX/MYLANTA) 200-200-20  MG/5ML suspension 30 mL  30 mL Oral Q6H PRN Michael Boston, MD   30 mL at 05/27/21 2239   ascorbic acid (VITAMIN C) tablet 500 mg  500 mg Oral BID Michael Boston, MD   500 mg at 05/29/21 2113   brimonidine (ALPHAGAN) 0.15 % ophthalmic solution 1 drop  1 drop Both Eyes Daily Michael Boston, MD   1 drop at 05/28/21 0920   Chlorhexidine Gluconate Cloth 2 % PADS 6 each  6 each Topical Daily Michael Boston, MD   6 each at 05/28/21 0943   clonazePAM (KLONOPIN) tablet 0.5 mg  0.5 mg Oral Ardeen Fillers, MD   0.5 mg at 05/29/21 2113   colchicine tablet 0.6 mg  0.6 mg Oral Daily PRN Michael Boston, MD       diclofenac Sodium (VOLTAREN) 1 % topical gel 2 g  2 g Topical TID PRN Michael Boston, MD       diphenhydrAMINE (BENADRYL) 12.5 MG/5ML elixir 12.5 mg  12.5 mg Oral Q6H PRN Michael Boston, MD       Or   diphenhydrAMINE (BENADRYL) injection 12.5 mg  12.5 mg Intravenous Q6H PRN Michael Boston, MD       enalaprilat (VASOTEC) injection 0.625-1.25 mg  0.625-1.25 mg Intravenous Q6H PRN Michael Boston, MD       enoxaparin (LOVENOX) injection 40 mg  40 mg Subcutaneous Q24H Michael Boston, MD   40 mg at 05/29/21 0738   famotidine (PEPCID) tablet 20 mg  20 mg Oral Daily Michael Boston, MD   20 mg at 05/29/21 1000   feeding supplement (ENSURE SURGERY) liquid 237 mL  237 mL Oral BID BM Michael Boston, MD   237 mL at 05/29/21 1316   ferrous sulfate tablet 325 mg  325 mg Oral BID WC Michael Boston, MD   325 mg at 05/30/21 8527   gabapentin (NEURONTIN) capsule 200 mg  200 mg Oral TID Michael Boston, MD   200 mg at 05/29/21 2113   HYDROcodone-acetaminophen (NORCO/VICODIN) 5-325 MG per tablet 1-2 tablet  1-2 tablet Oral Q4H PRN Michael Boston, MD       HYDROmorphone (DILAUDID) injection 0.5-2 mg  0.5-2 mg Intravenous Q4H PRN Michael Boston, MD       latanoprost (XALATAN) 0.005 % ophthalmic solution 1 drop  1 drop Both Eyes Ardeen Fillers, MD   1 drop at 05/29/21 2113   levothyroxine (SYNTHROID) tablet 100 mcg  100 mcg Oral Daily  Michael Boston, MD   100 mcg at 05/30/21 0603   lip balm (CARMEX) ointment 1 application  1 application Topical BID Michael Boston, MD   1 application at 78/24/23 2113   magic mouthwash  15 mL Oral QID PRN Michael Boston, MD  melatonin tablet 3 mg  3 mg Oral QHS PRN Michael Boston, MD       methocarbamol (ROBAXIN) 1,000 mg in dextrose 5 % 100 mL IVPB  1,000 mg Intravenous Q6H PRN Michael Boston, MD       methocarbamol (ROBAXIN) tablet 1,000 mg  1,000 mg Oral Q6H PRN Michael Boston, MD       metoprolol tartrate (LOPRESSOR) injection 5 mg  5 mg Intravenous Q6H PRN Michael Boston, MD   5 mg at 05/30/21 6387   multivitamin with minerals tablet 1 tablet  1 tablet Oral Daily Michael Boston, MD   1 tablet at 05/29/21 1046   ondansetron (ZOFRAN) tablet 4 mg  4 mg Oral Q6H PRN Michael Boston, MD       Or   ondansetron [facility]) injection 4 mg  4 mg Intravenous Q6H PRN Michael Boston, MD       polycarbophil (FIBERCON) tablet 625 mg  625 mg Oral Daily Michael Boston, MD   625 mg at 05/29/21 1046   potassium chloride SA (KLOR-CON M) CR tablet 40 mEq  40 mEq Oral Daily Michael Boston, MD   40 mEq at 05/29/21 1048   prochlorperazine (COMPAZINE) tablet 10 mg  10 mg Oral Q6H PRN Michael Boston, MD       Or   prochlorperazine (COMPAZINE) injection 5-10 mg  5-10 mg Intravenous Q6H PRN Michael Boston, MD       sodium chloride flush (NS) 0.9 % injection 3 mL  3 mL Intravenous Gorden Harms, MD   3 mL at 05/29/21 2113   sodium chloride flush (NS) 0.9 % injection 3 mL  3 mL Intravenous PRN Michael Boston, MD       spironolactone (ALDACTONE) tablet 25 mg  25 mg Oral Daily Michael Boston, MD   25 mg at 05/29/21 1047   traMADol (ULTRAM) tablet 50 mg  50 mg Oral Q6H PRN Michael Boston, MD         Allergies  Allergen Reactions   Amlodipine Other (See Comments)    Stomach pains--"feel like my insides are on fire"   Prednisone Other (See Comments)    Heart racing, anxiety --> clarified no anaphylaxis to prednisone. Tolerated  prednisone 02/2019 without issue   Ace Inhibitors Cough   Carvedilol Other (See Comments)    fatigue   Losartan Other (See Comments)    Cr bumped   Other Other (See Comments)    No seeds or corn because of IBS   Paroxetine Hcl Other (See Comments)    Not effective - felt ill on this medicine   Risedronate Sodium     REACTION: joint pain   Simvastatin     REACTION: the full 20 mg pill causes leg pain- can tol 10 mg   Toprol Xl [Metoprolol] Diarrhea    Diarrhea and weakness   Tramadol Other (See Comments)    Felt like passing out   Alendronate Sodium Palpitations   Influenza Vaccines Rash and Other (See Comments)    Led to mental breakdown in 1960s    Silenor [Doxepin Hcl] Palpitations    Hallucinations and racing heart   Sulfonamide Derivatives Rash    Signed: Morton Peters, MD, FACS, MASCRS Esophageal, Gastrointestinal & Colorectal Surgery Robotic and Minimally Invasive Surgery  [facility], [place]  [address], [place], [place] [address] [phone] Fax [phone] Main  CONTACT INFORMATION:  Weekday (9AM-5PM):  Call [facility] main office at [phone]  Weeknight (5PM-9AM) or Weekend/Holiday: Check [email or web address] (password " [email or web address]") for General Surgery [facility] coverage  (Please, do not use SecureChat as it is not reliable communication to operating surgeons for immediate patient care)      05/30/2021, 9:15 AM

## 2021-05-30 NOTE — Plan of Care (Signed)
°  Problem: Education: Goal: Knowledge of General Education information will improve Description: Including pain rating scale, medication(s)/side effects and non-pharmacologic comfort measures Outcome: Adequate for Discharge   Problem: Health Behavior/Discharge Planning: Goal: Ability to manage health-related needs will improve Outcome: Adequate for Discharge   Problem: Clinical Measurements: Goal: Ability to maintain clinical measurements within normal limits will improve Outcome: Adequate for Discharge Goal: Will remain free from infection Outcome: Adequate for Discharge Goal: Diagnostic test results will improve Outcome: Adequate for Discharge Goal: Respiratory complications will improve Outcome: Adequate for Discharge Goal: Cardiovascular complication will be avoided Outcome: Adequate for Discharge   Problem: Activity: Goal: Risk for activity intolerance will decrease Outcome: Adequate for Discharge   Problem: Nutrition: Goal: Adequate nutrition will be maintained Outcome: Adequate for Discharge   Problem: Coping: Goal: Level of anxiety will decrease Outcome: Adequate for Discharge   Problem: Elimination: Goal: Will not experience complications related to bowel motility Outcome: Adequate for Discharge Goal: Will not experience complications related to urinary retention Outcome: Adequate for Discharge   Problem: Pain Managment: Goal: General experience of comfort will improve Outcome: Adequate for Discharge   Problem: Safety: Goal: Ability to remain free from injury will improve Outcome: Adequate for Discharge   Problem: Skin Integrity: Goal: Risk for impaired skin integrity will decrease Outcome: Adequate for Discharge   Problem: Acute Rehab OT Goals (only OT should resolve) Goal: Pt. Will Perform Lower Body Dressing Outcome: Adequate for Discharge Goal: Pt. Will Transfer To Toilet Outcome: Adequate for Discharge Goal: Pt. Will Perform Toileting-Clothing  Manipulation Outcome: Adequate for Discharge Goal: OT Additional ADL Goal #1 Outcome: Adequate for Discharge   Problem: Acute Rehab PT Goals(only PT should resolve) Goal: Pt Will Go Supine/Side To Sit Outcome: Adequate for Discharge Goal: Patient Will Transfer Sit To/From Stand Outcome: Adequate for Discharge Goal: Pt Will Ambulate Outcome: Adequate for Discharge

## 2021-05-30 NOTE — Progress Notes (Signed)
Assessment unchanged. Pt and daughter verbalized understanding of dc instructions including medications and follow up care. Discharged via wc to front entrance accompanied by NT and daughter.

## 2021-05-30 NOTE — Progress Notes (Signed)
Occupational Therapy Treatment Patient Details Name: Brooke Beasley MRN: [id number] DOB: 12/01/1930 Today's Date: 05/30/2021   History of present illness Patient is an 88 year old female admitted 05/26/21 with complete rectal prolaspse and is now ROBOTIC LOW ANTERIOR RECTOSIGMOID RESECTION   OT comments  Patient and daughter were educated on LB dressing with AE to maintain independence in ADLs. Patient verbalized and demonstrated understanding. Patient was min guard for LB dressing tasks with cues to carryover education. Patients daughter reported patient would have caregiver assist for next two weeks at home. Patient and daughter were educated on importance of preserving independence. Patient was noted to have some discoloration on tongue. Nursing made aware. Patient provided with oral hygiene materials. Patient's daughter encouraged to discuss with MD. MD in room at end of session.    Recommendations for follow up therapy are one component of a multi-disciplinary discharge planning process, led by the attending physician.  Recommendations may be updated based on patient status, additional functional criteria and insurance authorization.    Follow Up Recommendations  Home health OT    Assistance Recommended at Discharge Intermittent Supervision/Assistance  Patient can return home with the following  A little help with walking and/or transfers;A lot of help with bathing/dressing/bathroom   Equipment Recommendations  None recommended by OT    Recommendations for Other Services      Precautions / Restrictions Precautions Precautions: Fall Precaution Comments: abdominal incisions Restrictions Weight Bearing Restrictions: No       Mobility Bed Mobility Overal bed mobility: Needs Assistance   Rolling: Supervision Sidelying to sit: Supervision       General bed mobility comments: Used bed rails and log roll technique (has a bed rail at home)    Transfers                          Balance Overall balance assessment: Needs assistance Sitting-balance support: No upper extremity supported Sitting balance-Leahy Scale: Good     Standing balance support: No upper extremity supported, Bilateral upper extremity supported Standing balance-Leahy Scale: Fair                             ADL either performed or assessed with clinical judgement   ADL Overall ADL's : Needs assistance/impaired     Grooming: Wash/dry face;Standing Grooming Details (indicate cue type and reason): with RW at sink. patient declined to participate in oral care stating daughter cleaned dentures this AM. patient was educated on importance of brushing tounge to prevent thrush. patient verbalized understanding and was provided with oral care items. MD in room at this time with nursing to carryover oral care on this date.               Lower Body Dressing Details (indicate cue type and reason): patient was educated on LB dressing wtih AE with patient able to demonstraet how to don/doff socks and pants with reacher and sock aid. patient verbalized understanding. patient's daughter reported being able to assist with LB dressing. patient and daughter were eduacted on importance of preserving independence and reducing learned helplessness. patient and daughter verbalized understanding. patients daughter inquired about pure wic for home. patient was educated on skin integrity risks with pure wic and decreased continence and decreased functional activity tolerance. patient and daughter verbalized understanding. Toilet Transfer: Agricultural engineer (2 wheels);BSC/3in1   Toileting- Water quality scientist and Hygiene: Maximal assistance;Sit to/from stand Toileting -  Clothing Manipulation Details (indicate cue type and reason): with education on attempting to assist as pain starts to lessen.            Extremity/Trunk Assessment              Vision       Perception      Praxis      Cognition Arousal/Alertness: Awake/alert Behavior During Therapy: WFL for tasks assessed/performed Overall Cognitive Status: Within Functional Limits for tasks assessed                                 General Comments: with daughter present as well        Exercises      Shoulder Instructions       General Comments      Pertinent Vitals/ Pain       Pain Assessment Pain Assessment: Faces Faces Pain Scale: Hurts a little bit Pain Location: abdomen with coughing and transfers Pain Descriptors / Indicators: Grimacing, Guarding Pain Intervention(s): Limited activity within patient's tolerance, Monitored during session  Home Living                                          Prior Functioning/Environment              Frequency  Min 2X/week        Progress Toward Goals  OT Goals(current goals can now be found in the care plan section)  Progress towards OT goals: Progressing toward goals     Plan Discharge plan remains appropriate    Co-evaluation                 AM-PAC OT "6 Clicks" Daily Activity     Outcome Measure   Help from another person eating meals?: A Little Help from another person taking care of personal grooming?: A Little Help from another person toileting, which includes using toliet, bedpan, or urinal?: A Lot Help from another person bathing (including washing, rinsing, drying)?: A Lot Help from another person to put on and taking off regular upper body clothing?: A Lot Help from another person to put on and taking off regular lower body clothing?: A Lot 6 Click Score: 14    End of Session Equipment Utilized During Treatment: Rolling walker (2 wheels)  OT Visit Diagnosis: Pain   Activity Tolerance Patient tolerated treatment well   Patient Left in chair;with family/visitor present;with call bell/phone within reach   Nurse Communication Mobility status        Time: 5053-9767 OT  Time Calculation (min): 35 min  Charges: OT General Charges $OT Visit: 1 Visit OT Treatments $Self Care/Home Management : 23-37 mins  Jackelyn Poling OTR/L, MS Acute Rehabilitation Department Office# [phone] Pager# [phone]   Marcellina Millin 05/30/2021, 9:23 AM

## 2021-06-03 ENCOUNTER — Encounter: Payer: Self-pay | Admitting: Family Medicine

## 2021-06-03 DIAGNOSIS — M19011 Primary osteoarthritis, right shoulder: Secondary | ICD-10-CM | POA: Diagnosis not present

## 2021-06-03 DIAGNOSIS — M545 Low back pain, unspecified: Secondary | ICD-10-CM | POA: Diagnosis not present

## 2021-06-03 DIAGNOSIS — I129 Hypertensive chronic kidney disease with stage 1 through stage 4 chronic kidney disease, or unspecified chronic kidney disease: Secondary | ICD-10-CM | POA: Diagnosis not present

## 2021-06-03 DIAGNOSIS — H409 Unspecified glaucoma: Secondary | ICD-10-CM | POA: Diagnosis not present

## 2021-06-03 DIAGNOSIS — M19042 Primary osteoarthritis, left hand: Secondary | ICD-10-CM | POA: Diagnosis not present

## 2021-06-03 DIAGNOSIS — K219 Gastro-esophageal reflux disease without esophagitis: Secondary | ICD-10-CM | POA: Diagnosis not present

## 2021-06-03 DIAGNOSIS — M1120 Other chondrocalcinosis, unspecified site: Secondary | ICD-10-CM | POA: Diagnosis not present

## 2021-06-03 DIAGNOSIS — G4700 Insomnia, unspecified: Secondary | ICD-10-CM | POA: Diagnosis not present

## 2021-06-03 DIAGNOSIS — M810 Age-related osteoporosis without current pathological fracture: Secondary | ICD-10-CM | POA: Diagnosis not present

## 2021-06-03 DIAGNOSIS — D62 Acute posthemorrhagic anemia: Secondary | ICD-10-CM | POA: Diagnosis not present

## 2021-06-03 DIAGNOSIS — F419 Anxiety disorder, unspecified: Secondary | ICD-10-CM | POA: Diagnosis not present

## 2021-06-03 DIAGNOSIS — K581 Irritable bowel syndrome with constipation: Secondary | ICD-10-CM | POA: Diagnosis not present

## 2021-06-03 DIAGNOSIS — Z48815 Encounter for surgical aftercare following surgery on the digestive system: Secondary | ICD-10-CM | POA: Diagnosis not present

## 2021-06-03 DIAGNOSIS — E785 Hyperlipidemia, unspecified: Secondary | ICD-10-CM | POA: Diagnosis not present

## 2021-06-03 DIAGNOSIS — H9193 Unspecified hearing loss, bilateral: Secondary | ICD-10-CM | POA: Diagnosis not present

## 2021-06-03 DIAGNOSIS — Z79891 Long term (current) use of opiate analgesic: Secondary | ICD-10-CM | POA: Diagnosis not present

## 2021-06-03 DIAGNOSIS — E039 Hypothyroidism, unspecified: Secondary | ICD-10-CM | POA: Diagnosis not present

## 2021-06-03 DIAGNOSIS — K623 Rectal prolapse: Secondary | ICD-10-CM | POA: Diagnosis not present

## 2021-06-03 DIAGNOSIS — G2581 Restless legs syndrome: Secondary | ICD-10-CM | POA: Diagnosis not present

## 2021-06-03 DIAGNOSIS — Z96612 Presence of left artificial shoulder joint: Secondary | ICD-10-CM | POA: Diagnosis not present

## 2021-06-03 DIAGNOSIS — N183 Chronic kidney disease, stage 3 unspecified: Secondary | ICD-10-CM | POA: Diagnosis not present

## 2021-06-03 DIAGNOSIS — L299 Pruritus, unspecified: Secondary | ICD-10-CM | POA: Diagnosis not present

## 2021-06-03 DIAGNOSIS — D509 Iron deficiency anemia, unspecified: Secondary | ICD-10-CM | POA: Diagnosis not present

## 2021-06-03 DIAGNOSIS — F32A Depression, unspecified: Secondary | ICD-10-CM | POA: Diagnosis not present

## 2021-06-03 MED ORDER — BENZONATATE 100 MG PO CAPS: 100.0000 mg | ORAL_CAPSULE | Freq: Three times a day (TID) | ORAL | 0 refills | Status: DC | PRN

## 2021-06-03 NOTE — Addendum Note (Signed)
Addended by: [name] on: 06/03/2021 03:04 PM   Modules accepted: Orders

## 2021-06-04 ENCOUNTER — Telehealth: Payer: Self-pay

## 2021-06-04 NOTE — Telephone Encounter (Signed)
I

## 2021-06-04 NOTE — Telephone Encounter (Signed)
I called [facility] with [facility] who is requesting verbal orders for [facility] PT 2 x a wk for 4 wks and 1 x a wk for 4 wks.

## 2021-06-04 NOTE — Telephone Encounter (Signed)
[place] Night - Client Nonclinical Telephone Record  [facility] Client [place] Night - Client Client Site [place] Provider [name] - MD Contact Type Call Call [place] [facility] Now Who Is [place] / [place] Name Industry Name [employer]. Facility Number [phone] Patient Name Brooke Beasley Patient DOB March 23, 1931 Reason for Call Request to speak to Physician Initial Comment Caller states that her need orders from dr. about one of his pt. Additional Comment Verbal Pt ORDERS. Disp. Time Disposition Final User 06/03/2021 4:29:28 PM Send to [place], [place] 06/03/2021 4:37:52 PM Paged On Call back to Call [place], [place] 06/03/2021 4:41:05 PM Paged On Call to Patient Jeronimo Greaves 06/03/2021 4:41:11 PM Paged On Call back to Call [place], [place] 06/03/2021 4:43:50 PM Page Completed Jeronimo Greaves 06/03/2021 4:43:56 PM Paged On Call back to Call [place], [place] 06/03/2021 4:59:33 PM Page Completed Yes Bethanne Ginger, Brooke Beasley Comments User: Hilarie Fredrickson Date/Time Eilene Ghazi Time): 06/03/2021 4:59:07 PM Spoke with provider and she is not able to give home health care orders for this office. Attempted to call the facility and got voicemail. Paging DoctorName Phone DateTime Result/Outcome Message Type Notes Office Elayne Snare [phone] 06/03/2021 4:37:52 PM Paged On Call Back to Call Center Doctor Paged Pricilla Holm- MD [phone] 06/03/2021 4:41:04 PM Paged On Call to the Patient Doctor Paged Pricilla Holm- MD 06/03/2021 4:43:03 PM Spoke with On Call - General Message Result waiting on callback system require me to chose a disposition. Call Closed By: Hilarie Fredrickson Transaction Date/Time: 06/03/2021 4:24:44 PM (ET

## 2021-06-04 NOTE — Telephone Encounter (Signed)
Agree with these verbal orders. Thanks.  

## 2021-06-05 ENCOUNTER — Ambulatory Visit: Payer: PPO | Admitting: Nurse Practitioner

## 2021-06-06 ENCOUNTER — Other Ambulatory Visit (HOSPITAL_COMMUNITY): Payer: Self-pay

## 2021-06-10 NOTE — Telephone Encounter (Signed)
Brooke Beasley with [facility] notified as instructed and voiced understanding.

## 2021-06-12 DIAGNOSIS — N183 Chronic kidney disease, stage 3 unspecified: Secondary | ICD-10-CM | POA: Diagnosis not present

## 2021-06-12 DIAGNOSIS — G4700 Insomnia, unspecified: Secondary | ICD-10-CM | POA: Diagnosis not present

## 2021-06-12 DIAGNOSIS — L299 Pruritus, unspecified: Secondary | ICD-10-CM | POA: Diagnosis not present

## 2021-06-12 DIAGNOSIS — H9193 Unspecified hearing loss, bilateral: Secondary | ICD-10-CM | POA: Diagnosis not present

## 2021-06-12 DIAGNOSIS — M19042 Primary osteoarthritis, left hand: Secondary | ICD-10-CM | POA: Diagnosis not present

## 2021-06-12 DIAGNOSIS — Z48815 Encounter for surgical aftercare following surgery on the digestive system: Secondary | ICD-10-CM | POA: Diagnosis not present

## 2021-06-12 DIAGNOSIS — Z96612 Presence of left artificial shoulder joint: Secondary | ICD-10-CM | POA: Diagnosis not present

## 2021-06-12 DIAGNOSIS — Z79891 Long term (current) use of opiate analgesic: Secondary | ICD-10-CM | POA: Diagnosis not present

## 2021-06-12 DIAGNOSIS — H409 Unspecified glaucoma: Secondary | ICD-10-CM | POA: Diagnosis not present

## 2021-06-12 DIAGNOSIS — D509 Iron deficiency anemia, unspecified: Secondary | ICD-10-CM | POA: Diagnosis not present

## 2021-06-12 DIAGNOSIS — E039 Hypothyroidism, unspecified: Secondary | ICD-10-CM | POA: Diagnosis not present

## 2021-06-12 DIAGNOSIS — F419 Anxiety disorder, unspecified: Secondary | ICD-10-CM | POA: Diagnosis not present

## 2021-06-12 DIAGNOSIS — K219 Gastro-esophageal reflux disease without esophagitis: Secondary | ICD-10-CM | POA: Diagnosis not present

## 2021-06-12 DIAGNOSIS — M19011 Primary osteoarthritis, right shoulder: Secondary | ICD-10-CM | POA: Diagnosis not present

## 2021-06-12 DIAGNOSIS — M545 Low back pain, unspecified: Secondary | ICD-10-CM | POA: Diagnosis not present

## 2021-06-12 DIAGNOSIS — I129 Hypertensive chronic kidney disease with stage 1 through stage 4 chronic kidney disease, or unspecified chronic kidney disease: Secondary | ICD-10-CM | POA: Diagnosis not present

## 2021-06-12 DIAGNOSIS — K623 Rectal prolapse: Secondary | ICD-10-CM | POA: Diagnosis not present

## 2021-06-12 DIAGNOSIS — K581 Irritable bowel syndrome with constipation: Secondary | ICD-10-CM | POA: Diagnosis not present

## 2021-06-12 DIAGNOSIS — G2581 Restless legs syndrome: Secondary | ICD-10-CM | POA: Diagnosis not present

## 2021-06-12 DIAGNOSIS — M810 Age-related osteoporosis without current pathological fracture: Secondary | ICD-10-CM | POA: Diagnosis not present

## 2021-06-12 DIAGNOSIS — D62 Acute posthemorrhagic anemia: Secondary | ICD-10-CM | POA: Diagnosis not present

## 2021-06-12 DIAGNOSIS — M1120 Other chondrocalcinosis, unspecified site: Secondary | ICD-10-CM | POA: Diagnosis not present

## 2021-06-12 DIAGNOSIS — F32A Depression, unspecified: Secondary | ICD-10-CM | POA: Diagnosis not present

## 2021-06-12 DIAGNOSIS — E785 Hyperlipidemia, unspecified: Secondary | ICD-10-CM | POA: Diagnosis not present

## 2021-06-14 ENCOUNTER — Encounter: Payer: Self-pay | Admitting: Family Medicine

## 2021-06-16 MED ORDER — DERMACLOUD EX OINT: 1.0000 "application " | TOPICAL_OINTMENT | Freq: Two times a day (BID) | CUTANEOUS | 0 refills | Status: DC | PRN

## 2021-06-16 NOTE — Addendum Note (Signed)
Addended by: Viviana Simpler I on: 06/16/2021 12:48 PM   Modules accepted: Orders

## 2021-06-16 NOTE — Telephone Encounter (Signed)
PLEASE NOTE: All timestamps contained within this report are represented as [place] Standard Time. CONFIDENTIALTY NOTICE: This fax transmission is intended only for the addressee. It contains information that is legally privileged, confidential or otherwise protected from use or disclosure. If you are not the intended recipient, you are strictly prohibited from reviewing, disclosing, copying using or disseminating any of this information or taking any action in reliance on or regarding this information. If you have received this fax in error, please notify us immediately by telephone so that we can arrange for its return to [place]. Phone: [phone], Toll-Free: [phone], Fax: [phone] Page: 1 of 1 Call Id: [id number] [facility] - Client TELEPHONE ADVICE RECORD [facility] Patient Name: Brooke Beasley Gender: Female DOB: [birth year] Age: 88 Y 42 M 24 D Return Phone Number: [phone] (Secondary) Address: City/ State/ Zip: [place]  [address] Client [place] [facility] - Client Client Site [place] Provider [name] - MD Contact Type Call Who Is Calling Patient / Member / Family / Caregiver Call Type Triage / Clinical Caller Name Antoine Primas Relationship To Patient Daughter Return Phone Number [phone] (Secondary) Chief Complaint Feet swelling Reason for Call Symptomatic / Request for Health Information Initial Comment Caller States they're calling because her mother feet is very swollen and need medical advice. Translation No Disp. Time Eilene Ghazi Time) Disposition Final User 06/16/2021 10:46:39 AM Attempt made - message left [place], RN, Nira Conn 06/16/2021 11:03:23 AM Attempt made - no message left Standifer, RN, Nira Conn 06/16/2021 11:06:52 AM Clinical Call Yes Standifer, RN, Nira Conn

## 2021-06-16 NOTE — Telephone Encounter (Signed)
Plz address in Dr. G's absence.  °

## 2021-06-18 NOTE — Telephone Encounter (Signed)
Benefits received. Per [employer] insurance representative Alex C. That patient would owe 20%-which will be around $300. Patient advised. Patient states she will think about this. Patient just had surgery done recently-notes in epic- patient is recovering, feeling weak and having a little bit of a rough time and is following up with her specialist. FYI Dr Darnell Level. Patient will keep [place] posted

## 2021-06-20 ENCOUNTER — Other Ambulatory Visit: Payer: Self-pay | Admitting: Gastroenterology

## 2021-06-22 DIAGNOSIS — M19042 Primary osteoarthritis, left hand: Secondary | ICD-10-CM

## 2021-06-22 DIAGNOSIS — D62 Acute posthemorrhagic anemia: Secondary | ICD-10-CM

## 2021-06-22 DIAGNOSIS — G4700 Insomnia, unspecified: Secondary | ICD-10-CM | POA: Diagnosis not present

## 2021-06-22 DIAGNOSIS — Z96612 Presence of left artificial shoulder joint: Secondary | ICD-10-CM

## 2021-06-22 DIAGNOSIS — L299 Pruritus, unspecified: Secondary | ICD-10-CM

## 2021-06-22 DIAGNOSIS — Z9049 Acquired absence of other specified parts of digestive tract: Secondary | ICD-10-CM

## 2021-06-22 DIAGNOSIS — M810 Age-related osteoporosis without current pathological fracture: Secondary | ICD-10-CM

## 2021-06-22 DIAGNOSIS — F32A Depression, unspecified: Secondary | ICD-10-CM | POA: Diagnosis not present

## 2021-06-22 DIAGNOSIS — M545 Low back pain, unspecified: Secondary | ICD-10-CM | POA: Diagnosis not present

## 2021-06-22 DIAGNOSIS — K219 Gastro-esophageal reflux disease without esophagitis: Secondary | ICD-10-CM | POA: Diagnosis not present

## 2021-06-22 DIAGNOSIS — E039 Hypothyroidism, unspecified: Secondary | ICD-10-CM | POA: Diagnosis not present

## 2021-06-22 DIAGNOSIS — Z48815 Encounter for surgical aftercare following surgery on the digestive system: Secondary | ICD-10-CM | POA: Diagnosis not present

## 2021-06-22 DIAGNOSIS — E785 Hyperlipidemia, unspecified: Secondary | ICD-10-CM | POA: Diagnosis not present

## 2021-06-22 DIAGNOSIS — D509 Iron deficiency anemia, unspecified: Secondary | ICD-10-CM

## 2021-06-22 DIAGNOSIS — K623 Rectal prolapse: Secondary | ICD-10-CM

## 2021-06-22 DIAGNOSIS — I129 Hypertensive chronic kidney disease with stage 1 through stage 4 chronic kidney disease, or unspecified chronic kidney disease: Secondary | ICD-10-CM | POA: Diagnosis not present

## 2021-06-22 DIAGNOSIS — M1120 Other chondrocalcinosis, unspecified site: Secondary | ICD-10-CM

## 2021-06-22 DIAGNOSIS — K581 Irritable bowel syndrome with constipation: Secondary | ICD-10-CM | POA: Diagnosis not present

## 2021-06-22 DIAGNOSIS — G2581 Restless legs syndrome: Secondary | ICD-10-CM

## 2021-06-22 DIAGNOSIS — F419 Anxiety disorder, unspecified: Secondary | ICD-10-CM | POA: Diagnosis not present

## 2021-06-22 DIAGNOSIS — N183 Chronic kidney disease, stage 3 unspecified: Secondary | ICD-10-CM | POA: Diagnosis not present

## 2021-06-22 DIAGNOSIS — Z79891 Long term (current) use of opiate analgesic: Secondary | ICD-10-CM

## 2021-06-22 DIAGNOSIS — M19011 Primary osteoarthritis, right shoulder: Secondary | ICD-10-CM

## 2021-06-22 DIAGNOSIS — H9193 Unspecified hearing loss, bilateral: Secondary | ICD-10-CM

## 2021-06-22 DIAGNOSIS — H409 Unspecified glaucoma: Secondary | ICD-10-CM | POA: Diagnosis not present

## 2021-06-23 ENCOUNTER — Telehealth: Payer: Self-pay | Admitting: Family Medicine

## 2021-06-23 ENCOUNTER — Other Ambulatory Visit: Payer: Self-pay | Admitting: Surgery

## 2021-06-23 DIAGNOSIS — K623 Rectal prolapse: Secondary | ICD-10-CM

## 2021-06-23 DIAGNOSIS — R1031 Right lower quadrant pain: Secondary | ICD-10-CM

## 2021-06-23 DIAGNOSIS — K5909 Other constipation: Secondary | ICD-10-CM | POA: Diagnosis not present

## 2021-06-23 NOTE — Telephone Encounter (Signed)
Agree with this. Thanks.  

## 2021-06-23 NOTE — Telephone Encounter (Signed)
Lvm asking Brooke Beasley to call back.  Need to inform her Dr. Darnell Level is giving verbal orders for services requested for pt.

## 2021-06-23 NOTE — Telephone Encounter (Signed)
Home Health verbal orders Caller Name: connie Agency Name: [facility] number: [phone]  Requesting OT/PT/Skilled nursing/Social Work/Speech: OT  Reason: self care management, therapeutic exercises   Frequency: 1w6  Please forward to [facility] pool or providers CMA

## 2021-06-24 ENCOUNTER — Encounter: Payer: Self-pay | Admitting: Internal Medicine

## 2021-06-24 ENCOUNTER — Other Ambulatory Visit: Payer: Self-pay

## 2021-06-24 ENCOUNTER — Ambulatory Visit (INDEPENDENT_AMBULATORY_CARE_PROVIDER_SITE_OTHER): Payer: PPO | Admitting: Internal Medicine

## 2021-06-24 DIAGNOSIS — E039 Hypothyroidism, unspecified: Secondary | ICD-10-CM | POA: Diagnosis not present

## 2021-06-24 DIAGNOSIS — L299 Pruritus, unspecified: Secondary | ICD-10-CM | POA: Diagnosis not present

## 2021-06-24 DIAGNOSIS — J4521 Mild intermittent asthma with (acute) exacerbation: Secondary | ICD-10-CM

## 2021-06-24 DIAGNOSIS — G4700 Insomnia, unspecified: Secondary | ICD-10-CM | POA: Diagnosis not present

## 2021-06-24 DIAGNOSIS — H9193 Unspecified hearing loss, bilateral: Secondary | ICD-10-CM | POA: Diagnosis not present

## 2021-06-24 DIAGNOSIS — Z48815 Encounter for surgical aftercare following surgery on the digestive system: Secondary | ICD-10-CM | POA: Diagnosis not present

## 2021-06-24 DIAGNOSIS — D509 Iron deficiency anemia, unspecified: Secondary | ICD-10-CM | POA: Diagnosis not present

## 2021-06-24 DIAGNOSIS — K581 Irritable bowel syndrome with constipation: Secondary | ICD-10-CM | POA: Diagnosis not present

## 2021-06-24 DIAGNOSIS — J45909 Unspecified asthma, uncomplicated: Secondary | ICD-10-CM | POA: Insufficient documentation

## 2021-06-24 DIAGNOSIS — H409 Unspecified glaucoma: Secondary | ICD-10-CM | POA: Diagnosis not present

## 2021-06-24 DIAGNOSIS — Z79891 Long term (current) use of opiate analgesic: Secondary | ICD-10-CM | POA: Diagnosis not present

## 2021-06-24 DIAGNOSIS — G2581 Restless legs syndrome: Secondary | ICD-10-CM | POA: Diagnosis not present

## 2021-06-24 DIAGNOSIS — M1120 Other chondrocalcinosis, unspecified site: Secondary | ICD-10-CM | POA: Diagnosis not present

## 2021-06-24 DIAGNOSIS — E785 Hyperlipidemia, unspecified: Secondary | ICD-10-CM | POA: Diagnosis not present

## 2021-06-24 DIAGNOSIS — I129 Hypertensive chronic kidney disease with stage 1 through stage 4 chronic kidney disease, or unspecified chronic kidney disease: Secondary | ICD-10-CM | POA: Diagnosis not present

## 2021-06-24 DIAGNOSIS — M19042 Primary osteoarthritis, left hand: Secondary | ICD-10-CM | POA: Diagnosis not present

## 2021-06-24 DIAGNOSIS — D62 Acute posthemorrhagic anemia: Secondary | ICD-10-CM | POA: Diagnosis not present

## 2021-06-24 DIAGNOSIS — M19011 Primary osteoarthritis, right shoulder: Secondary | ICD-10-CM | POA: Diagnosis not present

## 2021-06-24 DIAGNOSIS — N183 Chronic kidney disease, stage 3 unspecified: Secondary | ICD-10-CM | POA: Diagnosis not present

## 2021-06-24 DIAGNOSIS — F32A Depression, unspecified: Secondary | ICD-10-CM | POA: Diagnosis not present

## 2021-06-24 DIAGNOSIS — Z96612 Presence of left artificial shoulder joint: Secondary | ICD-10-CM | POA: Diagnosis not present

## 2021-06-24 DIAGNOSIS — M545 Low back pain, unspecified: Secondary | ICD-10-CM | POA: Diagnosis not present

## 2021-06-24 DIAGNOSIS — M810 Age-related osteoporosis without current pathological fracture: Secondary | ICD-10-CM | POA: Diagnosis not present

## 2021-06-24 DIAGNOSIS — F419 Anxiety disorder, unspecified: Secondary | ICD-10-CM | POA: Diagnosis not present

## 2021-06-24 DIAGNOSIS — K623 Rectal prolapse: Secondary | ICD-10-CM | POA: Diagnosis not present

## 2021-06-24 DIAGNOSIS — K219 Gastro-esophageal reflux disease without esophagitis: Secondary | ICD-10-CM | POA: Diagnosis not present

## 2021-06-24 MED ORDER — AMOXICILLIN-POT CLAVULANATE 875-125 MG PO TABS: 1.0000 | ORAL_TABLET | Freq: Two times a day (BID) | ORAL | 0 refills | Status: DC

## 2021-06-24 MED ORDER — PREDNISONE 20 MG PO TABS: 40.0000 mg | ORAL_TABLET | Freq: Every day | ORAL | 0 refills | Status: DC

## 2021-06-24 NOTE — Progress Notes (Signed)
Subjective:    Patient ID: Brooke Beasley, female    DOB: 1930/08/26, 88 y.o.   MRN: [id number]  HPI Here with daughter Brooke Beasley due to ongoing respiratory symptoms  Mouth has been dry and throat sore Has been recurrent---off and on for 2 months Using mucinex again in the last 3 days  Regular cough---productive of yellow green Intermittent fever Gets cold--then hot Some SOB---more noticeable now Has sinus symptoms also--frontal pain now Passed out this morning---having pain, etc  Benzonatate not much help Mucinex may help some--getting stuff out  Daughter lives with her  Current Outpatient Medications on File Prior to Visit  Medication Sig Dispense Refill   acetaminophen (TYLENOL) 500 MG tablet Take 500-1,000 mg by mouth every 6 (six) hours as needed for moderate pain. Depends on pain     albuterol (PROVENTIL HFA;VENTOLIN HFA) 108 (90 Base) MCG/ACT inhaler Inhale 2 puffs into the lungs every 6 (six) hours as needed for wheezing or shortness of breath. 1 Inhaler 0   ALPHAGAN P 0.1 % SOLN Place 1 drop into both eyes in the morning.     benzonatate (TESSALON) 100 MG capsule Take 1 capsule (100 mg total) by mouth 3 (three) times daily as needed for cough. 30 capsule 0   bimatoprost (LUMIGAN) 0.01 % SOLN Place 1 drop into both eyes at bedtime.     Bioflavonoid Products (VITAMIN C) CHEW Chew 1 tablet by mouth daily.     bisacodyl (DULCOLAX) 5 MG EC tablet Take 1 tablet (5 mg total) by mouth daily as needed for moderate constipation. 30 tablet 1   cholecalciferol (VITAMIN D) 1000 units tablet Take 1,000 Units by mouth daily.     clonazePAM (KLONOPIN) 0.5 MG tablet TAKE 1 TABLET BY MOUTH AT BEDTIME 30 tablet 0   colchicine 0.6 MG tablet Take 1 tablet (0.6 mg total) by mouth daily as needed (gout flare). 90 tablet 0   denosumab (PROLIA) 60 MG/ML SOSY injection Inject 60 mg into the skin every 6 (six) months. 1 mL 1   Famotidine-Ca Carb-Mag Hydrox (PEPCID COMPLETE PO) Take 1 tablet  by mouth daily as needed (indigestion).     furosemide (LASIX) 20 MG tablet Take 20 mg by mouth daily as needed for edema or fluid.     gabapentin (NEURONTIN) 100 MG capsule Take 2 capsules by mouth 2 times daily. 40 capsule 1   HYDROcodone-acetaminophen (NORCO/VICODIN) 5-325 MG tablet Take 1 tablet by mouth every 6 hours as needed for moderate pain or severe pain. 20 tablet 0   hydrocortisone 2.5 % cream Place 1 application rectally daily as needed (hemorrhoids).     hydrOXYzine (ATARAX) 25 MG tablet Take 1 tablet by mouth 2 times daily as needed for itching. 40 tablet 0   Infant Care Products (DERMACLOUD) OINT Apply 1 application topically 2 (two) times daily as needed. 430 g 0   irbesartan (AVAPRO) 150 MG tablet Take 1 tablet (150 mg total) by mouth daily. 90 tablet 3   Iron-FA-B Cmp-C-Biot-Probiotic (FUSION PLUS) CAPS Take 1 capsule by mouth once daily 30 capsule 2   levothyroxine (SYNTHROID) 100 MCG tablet Take 1 tablet by mouth once daily 90 tablet 1   Misc Natural Products (OSTEO BI-FLEX TRIPLE STRENGTH PO) Take 1 tablet by mouth daily.     Multiple Vitamin (MULTIVITAMIN ADULT PO) Take by mouth daily.     omeprazole (PRILOSEC) 20 MG capsule Take 20 mg by mouth daily as needed (heartburn).     ondansetron (ZOFRAN) 4 MG  tablet Take 1 tablet by mouth every 8 hours as needed for nausea or refractory nausea / vomiting. 8 tablet 5   polyethylene glycol (MIRALAX / GLYCOLAX) 17 g packet Take 17 g by mouth daily.     sertraline (ZOLOFT) 100 MG tablet Take 100 mg by mouth in the morning.     Simethicone 180 MG CAPS Take 1 capsule by mouth daily as needed (indigestion).     spironolactone (ALDACTONE) 25 MG tablet Take 1 tablet (25 mg total) by mouth daily. 90 tablet 3   traMADol (ULTRAM) 50 MG tablet Take 50 mg by mouth every 6 (six) hours as needed for moderate pain.     VOLTAREN 1 % GEL APPLY ONE APPLICATION TOPICALLY 3 TIMES DAILY (Patient taking differently: Apply 2 g topically 3 (three) times  daily as needed (arthritis pain).) 100 g 3   Wheat Dextrin (BENEFIBER DRINK MIX PO) Take 15 mLs by mouth daily.     No current facility-administered medications on file prior to visit.    Allergies  Allergen Reactions   Amlodipine Other (See Comments)    Stomach pains--"feel like my insides are on fire"   Prednisone Other (See Comments)    Heart racing, anxiety --> clarified no anaphylaxis to prednisone. Tolerated prednisone 02/2019 without issue   Ace Inhibitors Cough   Carvedilol Other (See Comments)    fatigue   Losartan Other (See Comments)    Cr bumped   Other Other (See Comments)    No seeds or corn because of IBS   Paroxetine Hcl Other (See Comments)    Not effective - felt ill on this medicine   Risedronate Sodium     REACTION: joint pain   Simvastatin     REACTION: the full 20 mg pill causes leg pain- can tol 10 mg   Toprol Xl [Metoprolol] Diarrhea    Diarrhea and weakness   Tramadol Other (See Comments)    Felt like passing out   Alendronate Sodium Palpitations   Influenza Vaccines Rash and Other (See Comments)    Led to mental breakdown in 1960s    Silenor [Doxepin Hcl] Palpitations    Hallucinations and racing heart   Sulfonamide Derivatives Rash    Past Medical History:  Diagnosis Date   Anxiety    C. difficile diarrhea 07/11/2018   S/p multiple oral vanc treatments (course and tapers) and completed Zinplava monoclonal Ab treatment (05/10/2019) Fecal transplant on hold during COVID pandemic   CAP (community acquired pneumonia) 12/22/2017   Carotid stenosis 10/18/2014   R 1-39%, L 40-59%, rpt 1 yr (09/2014)    CKD (chronic kidney disease) stage 3, GFR 30-59 ml/min ([place]) 08/02/2014   Clostridioides difficile infection    hx 2020   COVID-19 virus infection 10/13/2020   Degenerative disc disease    LS   Depression    nervous breakdonw in 1964-out of work for a year   Diverticulosis    severe by colonoscopy   Family history of adverse reaction to  anesthesia    n/v   GERD (gastroesophageal reflux disease)    Glaucoma    Heart murmur 08/2011   mitral regurge - on echo    History of hiatal hernia    History of shingles    HTN (hypertension)    Hyperlipidemia    Hypertension    Hypothyroidism    IBS 11/02/2006   Intestinal bacterial overgrowth    In small colon   Lower GI bleed 12/2020   thought diverticular  complicated by ABLA with syncope and orbital fracture s/p hospitalization   Maxillary fracture ([place]) 04/08/2012   Osteoarthritis 2016   Jefm Bryant)   Osteoporosis    dexa 2011   Pseudogout 2016   shoulders ([place])    Past Surgical History:  Procedure Laterality Date   barium enema  2012   severe diverticulosis, redundant colon   Bowel obstruction  1999   no surgery in hosp x 3 days   COLONOSCOPY  1. 1999  2. 11/04   1. Not finished  2. Slight hemorrhage rectosigmoid area, severe sig diverticulosis   COLONOSCOPY WITH PROPOFOL N/A 09/10/2019   SSP with dysplasia, TA, rpt 3 yrs Marius Ditch, Tally Due, MD)   Dexa  1. [phone]   2. 9/04  3. 3/08    1. OP  2. OP, borderline, spine -2.44T  3. decreased BMD-OP   DG KNEE 1-2 VIEWS BILAT     LS x-ray with degenerative disc and facet change   ESOPHAGOGASTRODUODENOSCOPY     Negative   FLEXIBLE SIGMOIDOSCOPY N/A 01/16/2021   Procedure: FLEXIBLE SIGMOIDOSCOPY;  Surgeon: Gatha Mayer, MD;  Location: [facility];  Service: Endoscopy;  Laterality: N/A;  or unsedated   Hemorrhoid procedure  07/2006   PROCTOSCOPY N/A 05/26/2021   Procedure: RIGID PROCTOSCOPY;  Surgeon: Michael Boston, MD;  Location: [facility];  Service: General;  Laterality: N/A;   RECTOPEXY N/A 05/26/2021   Procedure: RECTOPEXY;  Surgeon: Michael Boston, MD;  Location: [facility];  Service: General;  Laterality: N/A;   TONSILLECTOMY     TOTAL SHOULDER ARTHROPLASTY Left 11/27/2015   Corky Mull, MD   TOTAL SHOULDER REVISION Left 03/16/2016   Procedure: TOTAL SHOULDER REVISION;  Surgeon: Corky Mull, MD;  Location:  [facility];  Service: Orthopedics;  Laterality: Left;   US ECHOCARDIOGRAPHY  [id number]/0962   Normal systolic fxn with EF 83-66%.  Focal basal septal hypertrophy.  Mild diastolic dysfunction.  Mild MR.   XI ROBOTIC ASSISTED LOWER ANTERIOR RESECTION N/A 05/26/2021   Procedure: ROBOTIC LOW ANTERIOR RECTOSIGMOID RESECTION; ASSESSMENT OF TISSUE PERFUSION WITH FIREFLY;  Surgeon: Michael Boston, MD;  Location: [facility];  Service: General;  Laterality: N/A;    Family History  Problem Relation Age of Onset   Diabetes Brother        post-op   Coronary artery disease Brother    Diabetes Paternal Grandfather    Breast cancer Cousin     Social History   Socioeconomic History   Marital status: Widowed    Spouse name: Not on file   Number of children: 2   Years of education: Not on file   Highest education level: Not on file  Occupational History   Occupation: Retired    Fish farm manager: RETIRED  Tobacco Use   Smoking status: Never   Smokeless tobacco: Never  Vaping Use   Vaping Use: Never used  Substance and Sexual Activity   Alcohol use: No   Drug use: No   Sexual activity: Not Currently  Other Topics Concern   Not on file  Social History Narrative   Left handed   Widow. Husband ([name]) deceased March 24, 2016 from dementia. She was caregiver.    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   Activity: no regular exercise   Diet: good water, fruits/vegetables daily      Patient Care Team:   [name], MD as PCP - General (Family Medicine)  Kate Sable, MD as PCP - Cardiology (Cardiology)   Dannielle Karvonen, RN as [facility]   Gross, Remo Lipps, MD as Consulting Physician (General Surgery)   Lin Landsman, MD as Consulting Physician (Gastroenterology)   Kate Sable, MD as Consulting Physician (Cardiology)   Social Determinants of Health   Financial Resource Strain: Low Risk     Difficulty of Paying Living Expenses: Not very hard  Food Insecurity: No Food Insecurity   Worried About Running Out of Food in the Last Year: Never true   [place] in the Last Year: Never true  Transportation Needs: No Transportation Needs   Lack of Transportation (Medical): No   Lack of Transportation (Non-Medical): No  Physical Activity: Insufficiently Active   Days of Exercise per Week: 7 days   Minutes of Exercise per Session: 10 min  Stress: No Stress Concern Present   Feeling of Stress : Only a little  Social Connections: Socially Isolated   Frequency of Communication with Friends and Family: More than three times a week   Frequency of Social Gatherings with Friends and Family: Three times a week   Attends Religious Services: Never   Active Member of Clubs or Organizations: No   Attends Archivist Meetings: Never   Marital Status: Widowed  Human resources officer Violence: Not At Risk   Fear of Current or Ex-Partner: No   Emotionally Abused: No   Physically Abused: No   Sexually Abused: No   Review of Systems Had surgery 1/30 for rectal prolapse Having ongoing pain "inside" since    Objective:   Physical Exam Constitutional:      General: She is not in acute distress.    Comments: Coarse, productive cough  HENT:     Head:     Comments: No sig sinus tenderness    Right Ear: Tympanic membrane and ear canal normal.     Left Ear: Tympanic membrane and ear canal normal.     Mouth/Throat:     Comments: Coating on lateral tongue Pharynx normal Pulmonary:     Effort: Pulmonary effort is normal.     Breath sounds: No rales.     Comments: Some lower lobe expiratory rhonchi and wheeze--slightly prolonged exp phase Musculoskeletal:     Cervical back: Neck supple.     Right lower leg: No edema.     Left lower leg: No edema.  Lymphadenopathy:     Cervical: No cervical adenopathy.  Neurological:     Mental Status: She is alert.           Assessment &  Plan:

## 2021-06-24 NOTE — Assessment & Plan Note (Signed)
Has chronic lung changes on CXR but no diagnosis ?Mild tightness and ongoing productive cough ?Will treat with prednisone 40mg  for 3 days ?augmentin 875 bid x 7 days ?

## 2021-06-25 ENCOUNTER — Telehealth: Payer: Self-pay | Admitting: Family Medicine

## 2021-06-25 NOTE — Telephone Encounter (Signed)
See 06/23/21 phn note.  ?

## 2021-06-25 NOTE — Telephone Encounter (Signed)
Brooke Beasley with [facility] returning your call. ?

## 2021-06-25 NOTE — Telephone Encounter (Signed)
Per Lahoma Crocker rtn call (see 06/25/21 phn note).   ? ?Spoke with Marlowe Kays informing her Dr. Darnell Level is giving verbal orders for services requested for pt.  Verbalizes understanding.  ?

## 2021-06-26 ENCOUNTER — Ambulatory Visit
Admission: RE | Admit: 2021-06-26 | Discharge: 2021-06-26 | Disposition: A | Discharge status: Home / Self Care | Payer: PPO | Source: Ambulatory Visit | Attending: Surgery | Admitting: Surgery

## 2021-06-26 ENCOUNTER — Other Ambulatory Visit: Payer: Self-pay

## 2021-06-26 DIAGNOSIS — K623 Rectal prolapse: Secondary | ICD-10-CM | POA: Insufficient documentation

## 2021-06-26 DIAGNOSIS — R1031 Right lower quadrant pain: Secondary | ICD-10-CM | POA: Diagnosis not present

## 2021-06-26 DIAGNOSIS — K5909 Other constipation: Secondary | ICD-10-CM | POA: Diagnosis not present

## 2021-06-26 DIAGNOSIS — N261 Atrophy of kidney (terminal): Secondary | ICD-10-CM | POA: Diagnosis not present

## 2021-06-26 DIAGNOSIS — K5730 Diverticulosis of large intestine without perforation or abscess without bleeding: Secondary | ICD-10-CM | POA: Diagnosis not present

## 2021-06-26 MED ORDER — IOHEXOL 300 MG/ML  SOLN
100.0000 mL | Freq: Once | INTRAMUSCULAR | Status: AC | PRN
  Administered 2021-06-26: 100 mL via INTRAVENOUS

## 2021-06-27 ENCOUNTER — Other Ambulatory Visit: Payer: Self-pay | Admitting: Family Medicine

## 2021-06-29 NOTE — Telephone Encounter (Signed)
Name of Medication: Clonazepam ?Name of Pharmacy: [facility] ?Last Fill or Written Date and Quantity: 05/31/21, #30 ?Last Office Visit and Type: 05/06/21, allergic reaction ?Next Office Visit and Type: 10/09/21, 6 mo f/u ?Last Controlled Substance Agreement Date: 09/26/14 ?Last UDS: 09/26/14 ? ? ?

## 2021-06-30 DIAGNOSIS — M810 Age-related osteoporosis without current pathological fracture: Secondary | ICD-10-CM | POA: Diagnosis not present

## 2021-06-30 DIAGNOSIS — M1120 Other chondrocalcinosis, unspecified site: Secondary | ICD-10-CM | POA: Diagnosis not present

## 2021-06-30 DIAGNOSIS — N183 Chronic kidney disease, stage 3 unspecified: Secondary | ICD-10-CM | POA: Diagnosis not present

## 2021-06-30 DIAGNOSIS — D509 Iron deficiency anemia, unspecified: Secondary | ICD-10-CM | POA: Diagnosis not present

## 2021-06-30 DIAGNOSIS — M545 Low back pain, unspecified: Secondary | ICD-10-CM | POA: Diagnosis not present

## 2021-06-30 DIAGNOSIS — M19042 Primary osteoarthritis, left hand: Secondary | ICD-10-CM | POA: Diagnosis not present

## 2021-06-30 DIAGNOSIS — F32A Depression, unspecified: Secondary | ICD-10-CM | POA: Diagnosis not present

## 2021-06-30 DIAGNOSIS — H409 Unspecified glaucoma: Secondary | ICD-10-CM | POA: Diagnosis not present

## 2021-06-30 DIAGNOSIS — L299 Pruritus, unspecified: Secondary | ICD-10-CM | POA: Diagnosis not present

## 2021-06-30 DIAGNOSIS — Z79891 Long term (current) use of opiate analgesic: Secondary | ICD-10-CM | POA: Diagnosis not present

## 2021-06-30 DIAGNOSIS — I129 Hypertensive chronic kidney disease with stage 1 through stage 4 chronic kidney disease, or unspecified chronic kidney disease: Secondary | ICD-10-CM | POA: Diagnosis not present

## 2021-06-30 DIAGNOSIS — Z96612 Presence of left artificial shoulder joint: Secondary | ICD-10-CM | POA: Diagnosis not present

## 2021-06-30 DIAGNOSIS — K219 Gastro-esophageal reflux disease without esophagitis: Secondary | ICD-10-CM | POA: Diagnosis not present

## 2021-06-30 DIAGNOSIS — E039 Hypothyroidism, unspecified: Secondary | ICD-10-CM | POA: Diagnosis not present

## 2021-06-30 DIAGNOSIS — E785 Hyperlipidemia, unspecified: Secondary | ICD-10-CM | POA: Diagnosis not present

## 2021-06-30 DIAGNOSIS — G4700 Insomnia, unspecified: Secondary | ICD-10-CM | POA: Diagnosis not present

## 2021-06-30 DIAGNOSIS — M19011 Primary osteoarthritis, right shoulder: Secondary | ICD-10-CM | POA: Diagnosis not present

## 2021-06-30 DIAGNOSIS — F419 Anxiety disorder, unspecified: Secondary | ICD-10-CM | POA: Diagnosis not present

## 2021-06-30 DIAGNOSIS — H9193 Unspecified hearing loss, bilateral: Secondary | ICD-10-CM | POA: Diagnosis not present

## 2021-06-30 DIAGNOSIS — Z48815 Encounter for surgical aftercare following surgery on the digestive system: Secondary | ICD-10-CM | POA: Diagnosis not present

## 2021-06-30 DIAGNOSIS — D62 Acute posthemorrhagic anemia: Secondary | ICD-10-CM | POA: Diagnosis not present

## 2021-06-30 DIAGNOSIS — K623 Rectal prolapse: Secondary | ICD-10-CM | POA: Diagnosis not present

## 2021-06-30 DIAGNOSIS — G2581 Restless legs syndrome: Secondary | ICD-10-CM | POA: Diagnosis not present

## 2021-06-30 DIAGNOSIS — K581 Irritable bowel syndrome with constipation: Secondary | ICD-10-CM | POA: Diagnosis not present

## 2021-06-30 NOTE — Telephone Encounter (Signed)
ERx 

## 2021-07-01 ENCOUNTER — Encounter: Payer: Self-pay | Admitting: Family Medicine

## 2021-07-02 MED ORDER — AMOXICILLIN-POT CLAVULANATE 875-125 MG PO TABS: 1.0000 | ORAL_TABLET | Freq: Two times a day (BID) | ORAL | 0 refills | Status: DC

## 2021-07-02 NOTE — Telephone Encounter (Signed)
There are no available in office appts at [facility] today. Sending note to Dr Darnell Level who is out of office and Lattie Haw CMA and will teams [place]. ?

## 2021-07-02 NOTE — Telephone Encounter (Signed)
[facility] - Client ?TELEPHONE ADVICE RECORD ?[facility]urse? ?Patient ?Name: ?Fieldale ?ERTS ?Gender: Female ?DOB: 09-05-1930 ?Age: 89 Y 7 M 8 D ?Return ?Phone ?Number: ?[id number] ?(Primary), ?[id number] ?(Secondary) ?Address: ?City/ ?State/ ?Zip: ?[place] ? [address] ?Client [facility] - Client ?Client Site [facility] ?Provider Viviana Simpler- MD ?Contact Type Call ?Who Is Calling Patient / Member / Family / Caregiver ?Call Type Triage / Clinical ?Caller Name Marygrace Drought ?Relationship To Patient Daughter ?Return Phone Number [phone] (Primary) ?Chief Complaint Vomiting ?Reason for Call Symptomatic / Request for Health Information ?Initial Comment Caller states her Mother was seen a week ago ?and is out of antibiotics. She is still spitting up ?yellowish greenish mucus. ?Translation No ?Nurse Assessment ?Nurse: Malena Peer, RN, Edwena Felty Date/Time (Eastern Time): 07/01/2021 5:14:35 PM ?Confirm and document reason for call. If ?symptomatic, describe symptoms. ?---Caller states her Mother was seen on 06/24/21 and was ?placed on antibiotics for bronchitis but is now out of ?antibiotics. Her symptoms and cough are somewhat ?improved, however when she does cough, she is ?still spitting up yellowish-greenish mucus. Denies ?any fever. Denies any need for triage at this time, ?however she is requesting another round of antibiotics ?if possible or is requesting an appt on Friday 07/03/21 ?for follow up. [facility] on ?[place]. ?Does the patient have any new or worsening ?symptoms? ---No ?Please document clinical information provided and ?list any resource used. ?---Caller is informed we are unable to reach out to the ?OCP after hours regarding medications, however a ?message will be sent to the office via fax showing her ?request. She is instructed to call the office tomorrow ?morning after opening at 8:00am to  discuss whether ?or not a refill may be sent to the pharmacy or if her ?mother needs to be seen again and she verbalizes ?understanding. ?Disp. Time (Eastern ?Time) Disposition Final User ?07/01/2021 5:21:46 PM Clinical Call Yes Malena Peer, RN, Edwena Felty ?PLEASE NOTE: All timestamps contained within this report are represented as [place] Standard Time. ?CONFIDENTIALTY NOTICE: This fax transmission is intended only for the addressee. It contains information that is legally privileged, confidential or ?otherwise protected from use or disclosure. If you are not the intended recipient, you are strictly prohibited from reviewing, disclosing, copying using ?or disseminating any of this information or taking any action in reliance on or regarding this information. If you have received this fax in error, please ?notify us immediately by telephone so that we can arrange for its return to [place]. Phone: [phone], Toll-Free: [phone], Fax: [phone] ?Page: 2 of  ?

## 2021-07-08 ENCOUNTER — Encounter: Payer: Self-pay | Admitting: Family Medicine

## 2021-07-09 DIAGNOSIS — M19011 Primary osteoarthritis, right shoulder: Secondary | ICD-10-CM | POA: Diagnosis not present

## 2021-07-09 DIAGNOSIS — E785 Hyperlipidemia, unspecified: Secondary | ICD-10-CM | POA: Diagnosis not present

## 2021-07-09 DIAGNOSIS — N183 Chronic kidney disease, stage 3 unspecified: Secondary | ICD-10-CM | POA: Diagnosis not present

## 2021-07-09 DIAGNOSIS — D509 Iron deficiency anemia, unspecified: Secondary | ICD-10-CM | POA: Diagnosis not present

## 2021-07-09 DIAGNOSIS — Z79891 Long term (current) use of opiate analgesic: Secondary | ICD-10-CM | POA: Diagnosis not present

## 2021-07-09 DIAGNOSIS — K623 Rectal prolapse: Secondary | ICD-10-CM | POA: Diagnosis not present

## 2021-07-09 DIAGNOSIS — F419 Anxiety disorder, unspecified: Secondary | ICD-10-CM | POA: Diagnosis not present

## 2021-07-09 DIAGNOSIS — L299 Pruritus, unspecified: Secondary | ICD-10-CM | POA: Diagnosis not present

## 2021-07-09 DIAGNOSIS — H409 Unspecified glaucoma: Secondary | ICD-10-CM | POA: Diagnosis not present

## 2021-07-09 DIAGNOSIS — F32A Depression, unspecified: Secondary | ICD-10-CM | POA: Diagnosis not present

## 2021-07-09 DIAGNOSIS — M1120 Other chondrocalcinosis, unspecified site: Secondary | ICD-10-CM | POA: Diagnosis not present

## 2021-07-09 DIAGNOSIS — H9193 Unspecified hearing loss, bilateral: Secondary | ICD-10-CM | POA: Diagnosis not present

## 2021-07-09 DIAGNOSIS — E039 Hypothyroidism, unspecified: Secondary | ICD-10-CM | POA: Diagnosis not present

## 2021-07-09 DIAGNOSIS — M810 Age-related osteoporosis without current pathological fracture: Secondary | ICD-10-CM | POA: Diagnosis not present

## 2021-07-09 DIAGNOSIS — G2581 Restless legs syndrome: Secondary | ICD-10-CM | POA: Diagnosis not present

## 2021-07-09 DIAGNOSIS — K581 Irritable bowel syndrome with constipation: Secondary | ICD-10-CM | POA: Diagnosis not present

## 2021-07-09 DIAGNOSIS — D62 Acute posthemorrhagic anemia: Secondary | ICD-10-CM | POA: Diagnosis not present

## 2021-07-09 DIAGNOSIS — Z96612 Presence of left artificial shoulder joint: Secondary | ICD-10-CM | POA: Diagnosis not present

## 2021-07-09 DIAGNOSIS — G4700 Insomnia, unspecified: Secondary | ICD-10-CM | POA: Diagnosis not present

## 2021-07-09 DIAGNOSIS — M545 Low back pain, unspecified: Secondary | ICD-10-CM | POA: Diagnosis not present

## 2021-07-09 DIAGNOSIS — K219 Gastro-esophageal reflux disease without esophagitis: Secondary | ICD-10-CM | POA: Diagnosis not present

## 2021-07-09 DIAGNOSIS — M19042 Primary osteoarthritis, left hand: Secondary | ICD-10-CM | POA: Diagnosis not present

## 2021-07-09 DIAGNOSIS — Z48815 Encounter for surgical aftercare following surgery on the digestive system: Secondary | ICD-10-CM | POA: Diagnosis not present

## 2021-07-09 DIAGNOSIS — I129 Hypertensive chronic kidney disease with stage 1 through stage 4 chronic kidney disease, or unspecified chronic kidney disease: Secondary | ICD-10-CM | POA: Diagnosis not present

## 2021-07-13 ENCOUNTER — Encounter: Payer: Self-pay | Admitting: Family Medicine

## 2021-07-13 ENCOUNTER — Ambulatory Visit (INDEPENDENT_AMBULATORY_CARE_PROVIDER_SITE_OTHER)
Admission: RE | Admit: 2021-07-13 | Discharge: 2021-07-13 | Disposition: A | Discharge status: Home / Self Care | Payer: PPO | Source: Ambulatory Visit | Attending: Family Medicine | Admitting: Family Medicine

## 2021-07-13 ENCOUNTER — Other Ambulatory Visit: Payer: Self-pay

## 2021-07-13 ENCOUNTER — Ambulatory Visit (INDEPENDENT_AMBULATORY_CARE_PROVIDER_SITE_OTHER): Payer: PPO | Admitting: Family Medicine

## 2021-07-13 VITALS — BP 132/78 | HR 76 | Temp 97.4°F | Ht 61.0 in | Wt 157.1 lb

## 2021-07-13 DIAGNOSIS — M545 Low back pain, unspecified: Secondary | ICD-10-CM | POA: Diagnosis not present

## 2021-07-13 DIAGNOSIS — M5441 Lumbago with sciatica, right side: Secondary | ICD-10-CM

## 2021-07-13 DIAGNOSIS — K623 Rectal prolapse: Secondary | ICD-10-CM | POA: Diagnosis not present

## 2021-07-13 MED ORDER — HYDROCODONE-ACETAMINOPHEN 5-325 MG PO TABS: 1.0000 | ORAL_TABLET | Freq: Three times a day (TID) | ORAL | 0 refills | Status: DC | PRN

## 2021-07-13 MED ORDER — PREDNISONE 20 MG PO TABS: ORAL_TABLET | ORAL | 0 refills | Status: DC

## 2021-07-13 NOTE — Progress Notes (Addendum)
? ? Patient ID: Brooke Beasley, female    DOB: June 16, 1930, 88 y.o.   MRN: [id number] ? ?This visit was conducted in person. ? ?BP 132/78   Pulse 76   Temp (!) 97.4 ?F (36.3 ?C) (Temporal)   Ht '5\' 1"'$  (1.549 m)   Wt 157 lb 2 oz (71.3 kg)   SpO2 96%   BMI 29.69 kg/m?   ? ?CC: R leg pain ?Subjective:  ? ?HPI: ?Brooke Beasley is a 88 y.o. female presenting on 07/13/2021 for Back Pain (C/o back pain radiating down leg to calf.  Started after GI surgery on 05/26/21, worsening. Pt accompanied by daughter, Brooke Beasley.  ) ? ? ?Recent robotic assisted lower anterior resetion with rectopexy for rectal prolapse 04/2021 (Gross) with improvement in GI issues. Notes back pain since surgery (6 wks ago) - describes R lower back pain with radiation down posterior leg to calf. Also has foot pain. Calf feels numb. Denies recent falls on injury or changes to bowels or bladder.  ? ?Treating pain with hydrocodone about 3 a day along with gabapentin '200mg'$  TID - without much improvement. She's also tried some tylenol. Pain causing loss of appetite as well as inability to wipe herself after BM.  ? ?+ nausea, syncope due to pain.  ? ?She denies constipation. ?No fevers/chills, bowel/bladder accidents.  ?Right leg feels weak.  ?Ambulating with walker.  ? ?H/o pseudogout - colchicine was not helpful.  ? ?Recent bronchitis treated with prednisone 3d burst and augmentin 7d course x2.  ?   ? ?Relevant past medical, surgical, family and social history reviewed and updated as indicated. Interim medical history since our last visit reviewed. ?Allergies and medications reviewed and updated. ?Outpatient Medications Prior to Visit  ?Medication Sig Dispense Refill  ? acetaminophen (TYLENOL) 500 MG tablet Take 500-1,000 mg by mouth every 6 (six) hours as needed for moderate pain. Depends on pain    ? albuterol (PROVENTIL HFA;VENTOLIN HFA) 108 (90 Base) MCG/ACT inhaler Inhale 2 puffs into the lungs every 6 (six) hours as needed for wheezing  or shortness of breath. 1 Inhaler 0  ? ALPHAGAN P 0.1 % SOLN Place 1 drop into both eyes in the morning.    ? benzonatate (TESSALON) 100 MG capsule Take 1 capsule (100 mg total) by mouth 3 (three) times daily as needed for cough. 30 capsule 0  ? bimatoprost (LUMIGAN) 0.01 % SOLN Place 1 drop into both eyes at bedtime.    ? Bioflavonoid Products (VITAMIN C) CHEW Chew 1 tablet by mouth daily.    ? bisacodyl (DULCOLAX) 5 MG EC tablet Take 1 tablet (5 mg total) by mouth daily as needed for moderate constipation. 30 tablet 1  ? cholecalciferol (VITAMIN D) 1000 units tablet Take 1,000 Units by mouth daily.    ? clonazePAM (KLONOPIN) 0.5 MG tablet TAKE 1 TABLET BY MOUTH AT BEDTIME 30 tablet 0  ? colchicine 0.6 MG tablet Take 1 tablet (0.6 mg total) by mouth daily as needed (gout flare). 90 tablet 0  ? denosumab (PROLIA) 60 MG/ML SOSY injection Inject 60 mg into the skin every 6 (six) months. 1 mL 1  ? Famotidine-Ca Carb-Mag Hydrox (PEPCID COMPLETE PO) Take 1 tablet by mouth daily as needed (indigestion).    ? furosemide (LASIX) 20 MG tablet Take 20 mg by mouth daily as needed for edema or fluid.    ? gabapentin (NEURONTIN) 100 MG capsule Take 2 capsules by mouth 2 times daily. 40 capsule 1  ? hydrocortisone  2.5 % cream Place 1 application rectally daily as needed (hemorrhoids).    ? Infant Care Products [facility]) OINT Apply 1 application topically 2 (two) times daily as needed. 430 g 0  ? irbesartan (AVAPRO) 150 MG tablet Take 1 tablet (150 mg total) by mouth daily. 90 tablet 3  ? Iron-FA-B Cmp-C-Biot-Probiotic (FUSION PLUS) CAPS Take 1 capsule by mouth once daily 30 capsule 2  ? levothyroxine (SYNTHROID) 100 MCG tablet Take 1 tablet by mouth once daily 90 tablet 1  ? Misc Natural Products (OSTEO BI-FLEX TRIPLE STRENGTH PO) Take 1 tablet by mouth daily.    ? Multiple Vitamin (MULTIVITAMIN ADULT PO) Take by mouth daily.    ? omeprazole (PRILOSEC) 20 MG capsule Take 20 mg by mouth daily as needed (heartburn).    ?  ondansetron (ZOFRAN) 4 MG tablet Take 1 tablet by mouth every 8 hours as needed for nausea or refractory nausea / vomiting. 8 tablet 5  ? polyethylene glycol (MIRALAX / GLYCOLAX) 17 g packet Take 17 g by mouth daily.    ? sertraline (ZOLOFT) 100 MG tablet Take 100 mg by mouth in the morning.    ? Simethicone 180 MG CAPS Take 1 capsule by mouth daily as needed (indigestion).    ? spironolactone (ALDACTONE) 25 MG tablet Take 1 tablet (25 mg total) by mouth daily. 90 tablet 3  ? traMADol (ULTRAM) 50 MG tablet Take 50 mg by mouth every 6 (six) hours as needed for moderate pain.    ? VOLTAREN 1 % GEL APPLY ONE APPLICATION TOPICALLY 3 TIMES DAILY (Patient taking differently: Apply 2 g topically 3 (three) times daily as needed (arthritis pain).) 100 g 3  ? Wheat Dextrin (BENEFIBER DRINK MIX PO) Take 15 mLs by mouth daily.    ? HYDROcodone-acetaminophen (NORCO/VICODIN) 5-325 MG tablet Take 1 tablet by mouth every 6 hours as needed for moderate pain or severe pain. 20 tablet 0  ? amoxicillin-clavulanate (AUGMENTIN) 875-125 MG tablet Take 1 tablet by mouth 2 (two) times daily. 14 tablet 0  ? hydrOXYzine (ATARAX) 25 MG tablet Take 1 tablet by mouth 2 times daily as needed for itching. 40 tablet 0  ? predniSONE (DELTASONE) 20 MG tablet Take 2 tablets (40 mg total) by mouth daily. 6 tablet 0  ? ?No facility-administered medications prior to visit.  ?  ? ?Per HPI unless specifically indicated in ROS section below ?Review of Systems ? ?Objective:  ?BP 132/78   Pulse 76   Temp (!) 97.4 ?F (36.3 ?C) (Temporal)   Ht '5\' 1"'$  (1.549 m)   Wt 157 lb 2 oz (71.3 kg)   SpO2 96%   BMI 29.69 kg/m?   ?Wt Readings from Last 3 Encounters:  ?07/13/21 157 lb 2 oz (71.3 kg)  ?06/24/21 161 lb (73 kg)  ?05/29/21 163 lb (73.9 kg)  ?  ?  ?Physical Exam ?Vitals and nursing note reviewed.  ?Constitutional:   ?   Appearance: Normal appearance. She is not ill-appearing.  ?Musculoskeletal:  ?   Right lower leg: No edema.  ?   Left lower leg: No edema.   ?   Comments:  ?No significant pain midline spine ?No paraspinous mm tenderness but noted R lumbar mm tightness  ?Neg seated SLR bilaterally. ?No pain with int/ext rotation at hip. ?+ discomfort at R sciatic notch.  ?No pain at SIJ, GTB bilaterally.   ?Skin: ?   General: Skin is warm and dry.  ?   Findings: No rash.  ?Neurological:  ?  Mental Status: She is alert.  ?   Sensory: Sensation is intact.  ?   Motor: Motor function is intact.  ?   Coordination: Coordination is intact.  ?   Comments:  ?5/5 strength BLE ?Sensation intact ?2+ DTR bilateral patella  ?Psychiatric:     ?   Mood and Affect: Mood normal.     ?   Behavior: Behavior normal.  ? ?  ?Assessment & Plan:  ?This visit occurred during the SARS-CoV-2 public health emergency.  Safety protocols were in place, including screening questions prior to the visit, additional usage of staff PPE, and extensive cleaning of exam room while observing appropriate contact time as indicated for disinfecting solutions.  ? ?Problem List Items Addressed This Visit   ? ? Complete rectal prolapse  ?  S/p recent lower anterior resection with rectopexy with benefit Johney Maine) ?  ?  ? Right-sided low back pain with right-sided sciatica - Primary  ?  Story/exam suspicious for R sciatica related to R lumbosacral (?S1) radiculopathy or piriformis syndrome. She is in severe pain due to this. Check lumbar films, Rx prednisone taper, as well as continue scheduled tylenol 500-'1000mg'$  TID with PRN hydrocodone. Discussed heating pad use. Update with effect of above. Consider spine clinic evaluation if ongoing severe pain.  ?  ?  ? Relevant Medications  ? HYDROcodone-acetaminophen (NORCO/VICODIN) 5-325 MG tablet  ? predniSONE (DELTASONE) 20 MG tablet  ? Other Relevant Orders  ? DG Lumbar Spine Complete  ?  ? ?Meds ordered this encounter  ?Medications  ? HYDROcodone-acetaminophen (NORCO/VICODIN) 5-325 MG tablet  ?  Sig: Take 1 tablet by mouth 3 (three) times daily as needed for moderate pain  or severe pain.  ?  Dispense:  20 tablet  ?  Refill:  0  ? predniSONE (DELTASONE) 20 MG tablet  ?  Sig: Take two tablets daily for 4 days followed by one tablet daily for 4 days  ?  Dispense:  12 tablet  ?  Refill:  0  ? ?

## 2021-07-13 NOTE — Patient Instructions (Addendum)
I think you have right sided sciatica.  ?You also have significant arthritis and scoliosis in the lower back.  ?Continue regular tylenol '500mg'$  three times daily, along with 1 tablet of hydrocodone for breakthrough pain  ?Ok to continue heating pad.  ?Take prednisone taper which I've sent to your pharmacy.  ?Let us know how you're doing with this.  ? ?Sciatica ?Sciatica is pain, numbness, weakness, or tingling along the path of the sciatic nerve. The sciatic nerve starts in the lower back and runs down the back of each leg. The nerve controls the muscles in the lower leg and in the back of the knee. It also provides feeling (sensation) to the back of the thigh, the lower leg, and the sole of the foot. Sciatica is a symptom of another medical condition that pinches or puts pressure on the sciatic nerve. ?Sciatica most often only affects one side of the body. Sciatica usually goes away on its own or with treatment. In some cases, sciatica may come back (recur). ?What are the causes? ?This condition is caused by pressure on the sciatic nerve or pinching of the nerve. This may be the result of: ?A disk in between the bones of the spine bulging out too far (herniated disk). ?Age-related changes in the spinal disks. ?A pain disorder that affects a muscle in the buttock. ?Extra bone growth near the sciatic nerve. ?A break (fracture) of the pelvis. ?Pregnancy. ?Tumor. This is rare. ?What increases the risk? ?The following factors may make you more likely to develop this condition: ?Playing sports that place pressure or stress on the spine. ?Having poor strength and flexibility. ?A history of back injury or surgery. ?Sitting for long periods of time. ?Doing activities that involve repetitive bending or lifting. ?Obesity. ?What are the signs or symptoms? ?Symptoms can vary from mild to very severe, and they may include: ?Any of these problems in the lower back, leg, hip, or buttock: ?Mild tingling, numbness, or dull  aches. ?Burning sensations. ?Sharp pains. ?Numbness in the back of the calf or the sole of the foot. ?Leg weakness. ?Severe back pain that makes movement difficult. ?Symptoms may get worse when you cough, sneeze, or laugh, or when you sit or stand for long periods of time. ?How is this diagnosed? ?This condition may be diagnosed based on: ?Your symptoms and medical history. ?A physical exam. ?Blood tests. ?Imaging tests, such as: ?X-rays. ?MRI. ?CT scan. ?How is this treated? ?In many cases, this condition improves on its own without treatment. However, treatment may include: ?Reducing or modifying physical activity. ?Exercising and stretching. ?Icing and applying heat to the affected area. ?Medicines that help to: ?Relieve pain and swelling. ?Relax your muscles. ?Injections of medicines that help to relieve pain, irritation, and inflammation around the sciatic nerve (steroids). ?Surgery. ?Follow these instructions at home: ?Medicines ?Take over-the-counter and prescription medicines only as told by your health care provider. ?Ask your health care provider if the medicine prescribed to you: ?Requires you to avoid driving or using heavy machinery. ?Can cause constipation. You may need to take these actions to prevent or treat constipation: ?Drink enough fluid to keep your urine pale yellow. ?Take over-the-counter or prescription medicines. ?Eat foods that are high in fiber, such as beans, whole grains, and fresh fruits and vegetables. ?Limit foods that are high in fat and processed sugars, such as fried or sweet foods. ?Managing pain ?  ?If directed, put ice on the affected area. ?Put ice in a plastic bag. ?Place a towel  between your skin and the bag. ?Leave the ice on for 20 minutes, 2-3 times a day. ?If directed, apply heat to the affected area. Use the heat source that your health care provider recommends, such as a moist heat pack or a heating pad. ?Place a towel between your skin and the heat source. ?Leave the  heat on for 20-30 minutes. ?Remove the heat if your skin turns bright red. This is especially important if you are unable to feel pain, heat, or cold. You may have a greater risk of getting burned. ?Activity ? ?Return to your normal activities as told by your health care provider. Ask your health care provider what activities are safe for you. ?Avoid activities that make your symptoms worse. ?Take brief periods of rest throughout the day. ?When you rest for longer periods, mix in some mild activity or stretching between periods of rest. This will help to prevent stiffness and pain. ?Avoid sitting for long periods of time without moving. Get up and move around at least one time each hour. ?Exercise and stretch regularly, as told by your health care provider. ?Do not lift anything that is heavier than 10 lb (4.5 kg) while you have symptoms of sciatica. When you do not have symptoms, you should still avoid heavy lifting, especially repetitive heavy lifting. ?When you lift objects, always use proper lifting technique, which includes: ?Bending your knees. ?Keeping the load close to your body. ?Avoiding twisting. ?General instructions ?Maintain a healthy weight. Excess weight puts extra stress on your back. ?Wear supportive, comfortable shoes. Avoid wearing high heels. ?Avoid sleeping on a mattress that is too soft or too hard. A mattress that is firm enough to support your back when you sleep may help to reduce your pain. ?Keep all follow-up visits as told by your health care provider. This is important. ?Contact a health care provider if: ?You have pain that: ?Wakes you up when you are sleeping. ?Gets worse when you lie down. ?Is worse than you have experienced in the past. ?Lasts longer than 4 weeks. ?You have an unexplained weight loss. ?Get help right away if: ?You are not able to control when you urinate or have bowel movements (incontinence). ?You have: ?Weakness in your lower back, pelvis, buttocks, or legs that  gets worse. ?Redness or swelling of your back. ?A burning sensation when you urinate. ?Summary ?Sciatica is pain, numbness, weakness, or tingling along the path of the sciatic nerve. ?This condition is caused by pressure on the sciatic nerve or pinching of the nerve. ?Sciatica can cause pain, numbness, or tingling in the lower back, legs, hips, and buttocks. ?Treatment often includes rest, exercise, medicines, and applying ice or heat. ?This information is not intended to replace advice given to you by your health care provider. Make sure you discuss any questions you have with your health care provider. ?Document Revised: 05/01/2018 Document Reviewed: 05/01/2018 ?Elsevier Patient Education ? 2022 [place]. ? ? ?

## 2021-07-14 DIAGNOSIS — M5410 Radiculopathy, site unspecified: Secondary | ICD-10-CM | POA: Insufficient documentation

## 2021-07-14 DIAGNOSIS — M5441 Lumbago with sciatica, right side: Secondary | ICD-10-CM | POA: Insufficient documentation

## 2021-07-14 NOTE — Assessment & Plan Note (Addendum)
Story/exam suspicious for R sciatica related to R lumbosacral (?S1) radiculopathy or piriformis syndrome. She is in severe pain due to this. Check lumbar films, Rx prednisone taper, as well as continue scheduled tylenol 500-'1000mg'$  TID with PRN hydrocodone. Discussed heating pad use. Update with effect of above. Consider spine clinic evaluation if ongoing severe pain.  ?

## 2021-07-14 NOTE — Assessment & Plan Note (Signed)
S/p recent lower anterior resection with rectopexy with benefit Johney Maine) ?

## 2021-07-17 ENCOUNTER — Encounter: Payer: Self-pay | Admitting: Family Medicine

## 2021-07-17 DIAGNOSIS — M5441 Lumbago with sciatica, right side: Secondary | ICD-10-CM

## 2021-07-20 NOTE — Telephone Encounter (Signed)
Patient called this morning to follow-up on request submitted Friday for MRI and back specialist ? ? ?

## 2021-07-22 ENCOUNTER — Ambulatory Visit: Payer: PPO | Admitting: Family Medicine

## 2021-07-23 ENCOUNTER — Encounter (HOSPITAL_BASED_OUTPATIENT_CLINIC_OR_DEPARTMENT_OTHER): Payer: Self-pay | Admitting: Emergency Medicine

## 2021-07-23 ENCOUNTER — Other Ambulatory Visit: Payer: Self-pay

## 2021-07-23 ENCOUNTER — Inpatient Hospital Stay (HOSPITAL_BASED_OUTPATIENT_CLINIC_OR_DEPARTMENT_OTHER)
Admission: EM | Admit: 2021-07-23 | Discharge: 2021-08-06 | DRG: 552 | Disposition: A | Discharge status: Home Health | Payer: PPO | Attending: Internal Medicine | Admitting: Internal Medicine

## 2021-07-23 ENCOUNTER — Telehealth: Payer: Self-pay

## 2021-07-23 ENCOUNTER — Ambulatory Visit
Admission: RE | Admit: 2021-07-23 | Discharge: 2021-07-23 | Disposition: A | Discharge status: Home / Self Care | Payer: PPO | Source: Ambulatory Visit | Attending: Family Medicine | Admitting: Family Medicine

## 2021-07-23 DIAGNOSIS — B952 Enterococcus as the cause of diseases classified elsewhere: Secondary | ICD-10-CM | POA: Diagnosis present

## 2021-07-23 DIAGNOSIS — R200 Anesthesia of skin: Secondary | ICD-10-CM | POA: Diagnosis not present

## 2021-07-23 DIAGNOSIS — L0291 Cutaneous abscess, unspecified: Secondary | ICD-10-CM | POA: Diagnosis not present

## 2021-07-23 DIAGNOSIS — K651 Peritoneal abscess: Secondary | ICD-10-CM | POA: Diagnosis not present

## 2021-07-23 DIAGNOSIS — N1831 Chronic kidney disease, stage 3a: Secondary | ICD-10-CM | POA: Diagnosis present

## 2021-07-23 DIAGNOSIS — Z7952 Long term (current) use of systemic steroids: Secondary | ICD-10-CM

## 2021-07-23 DIAGNOSIS — D638 Anemia in other chronic diseases classified elsewhere: Secondary | ICD-10-CM | POA: Diagnosis not present

## 2021-07-23 DIAGNOSIS — B999 Unspecified infectious disease: Secondary | ICD-10-CM | POA: Diagnosis not present

## 2021-07-23 DIAGNOSIS — K219 Gastro-esophageal reflux disease without esophagitis: Secondary | ICD-10-CM | POA: Diagnosis present

## 2021-07-23 DIAGNOSIS — M4640 Discitis, unspecified, site unspecified: Secondary | ICD-10-CM | POA: Diagnosis not present

## 2021-07-23 DIAGNOSIS — M519 Unspecified thoracic, thoracolumbar and lumbosacral intervertebral disc disorder: Secondary | ICD-10-CM | POA: Diagnosis present

## 2021-07-23 DIAGNOSIS — Z882 Allergy status to sulfonamides status: Secondary | ICD-10-CM

## 2021-07-23 DIAGNOSIS — Z833 Family history of diabetes mellitus: Secondary | ICD-10-CM

## 2021-07-23 DIAGNOSIS — I083 Combined rheumatic disorders of mitral, aortic and tricuspid valves: Secondary | ICD-10-CM | POA: Diagnosis not present

## 2021-07-23 DIAGNOSIS — A0471 Enterocolitis due to Clostridium difficile, recurrent: Secondary | ICD-10-CM

## 2021-07-23 DIAGNOSIS — Z91048 Other nonmedicinal substance allergy status: Secondary | ICD-10-CM

## 2021-07-23 DIAGNOSIS — Z7982 Long term (current) use of aspirin: Secondary | ICD-10-CM

## 2021-07-23 DIAGNOSIS — M4646 Discitis, unspecified, lumbar region: Secondary | ICD-10-CM | POA: Diagnosis not present

## 2021-07-23 DIAGNOSIS — M48061 Spinal stenosis, lumbar region without neurogenic claudication: Secondary | ICD-10-CM | POA: Diagnosis not present

## 2021-07-23 DIAGNOSIS — I4891 Unspecified atrial fibrillation: Secondary | ICD-10-CM

## 2021-07-23 DIAGNOSIS — I482 Chronic atrial fibrillation, unspecified: Secondary | ICD-10-CM | POA: Diagnosis present

## 2021-07-23 DIAGNOSIS — K449 Diaphragmatic hernia without obstruction or gangrene: Secondary | ICD-10-CM | POA: Diagnosis present

## 2021-07-23 DIAGNOSIS — M4627 Osteomyelitis of vertebra, lumbosacral region: Secondary | ICD-10-CM | POA: Diagnosis present

## 2021-07-23 DIAGNOSIS — F419 Anxiety disorder, unspecified: Secondary | ICD-10-CM | POA: Diagnosis present

## 2021-07-23 DIAGNOSIS — R059 Cough, unspecified: Secondary | ICD-10-CM | POA: Diagnosis not present

## 2021-07-23 DIAGNOSIS — J9811 Atelectasis: Secondary | ICD-10-CM | POA: Diagnosis present

## 2021-07-23 DIAGNOSIS — T8143XA Infection following a procedure, organ and space surgical site, initial encounter: Secondary | ICD-10-CM | POA: Diagnosis not present

## 2021-07-23 DIAGNOSIS — E875 Hyperkalemia: Secondary | ICD-10-CM | POA: Diagnosis not present

## 2021-07-23 DIAGNOSIS — M545 Low back pain, unspecified: Secondary | ICD-10-CM | POA: Diagnosis not present

## 2021-07-23 DIAGNOSIS — J90 Pleural effusion, not elsewhere classified: Secondary | ICD-10-CM | POA: Diagnosis not present

## 2021-07-23 DIAGNOSIS — Z888 Allergy status to other drugs, medicaments and biological substances status: Secondary | ICD-10-CM

## 2021-07-23 DIAGNOSIS — B964 Proteus (mirabilis) (morganii) as the cause of diseases classified elsewhere: Secondary | ICD-10-CM | POA: Diagnosis present

## 2021-07-23 DIAGNOSIS — E039 Hypothyroidism, unspecified: Secondary | ICD-10-CM | POA: Diagnosis present

## 2021-07-23 DIAGNOSIS — I38 Endocarditis, valve unspecified: Secondary | ICD-10-CM | POA: Diagnosis present

## 2021-07-23 DIAGNOSIS — E785 Hyperlipidemia, unspecified: Secondary | ICD-10-CM | POA: Diagnosis present

## 2021-07-23 DIAGNOSIS — A419 Sepsis, unspecified organism: Secondary | ICD-10-CM | POA: Diagnosis not present

## 2021-07-23 DIAGNOSIS — G629 Polyneuropathy, unspecified: Secondary | ICD-10-CM | POA: Diagnosis present

## 2021-07-23 DIAGNOSIS — I4892 Unspecified atrial flutter: Secondary | ICD-10-CM | POA: Diagnosis present

## 2021-07-23 DIAGNOSIS — E038 Other specified hypothyroidism: Secondary | ICD-10-CM | POA: Diagnosis present

## 2021-07-23 DIAGNOSIS — M5441 Lumbago with sciatica, right side: Secondary | ICD-10-CM | POA: Diagnosis present

## 2021-07-23 DIAGNOSIS — Z452 Encounter for adjustment and management of vascular access device: Secondary | ICD-10-CM | POA: Diagnosis not present

## 2021-07-23 DIAGNOSIS — K611 Rectal abscess: Secondary | ICD-10-CM | POA: Diagnosis present

## 2021-07-23 DIAGNOSIS — M79604 Pain in right leg: Secondary | ICD-10-CM | POA: Diagnosis not present

## 2021-07-23 DIAGNOSIS — Z79899 Other long term (current) drug therapy: Secondary | ICD-10-CM

## 2021-07-23 DIAGNOSIS — F32A Depression, unspecified: Secondary | ICD-10-CM | POA: Diagnosis present

## 2021-07-23 DIAGNOSIS — I4819 Other persistent atrial fibrillation: Secondary | ICD-10-CM

## 2021-07-23 DIAGNOSIS — M419 Scoliosis, unspecified: Secondary | ICD-10-CM | POA: Diagnosis not present

## 2021-07-23 DIAGNOSIS — I129 Hypertensive chronic kidney disease with stage 1 through stage 4 chronic kidney disease, or unspecified chronic kidney disease: Secondary | ICD-10-CM | POA: Diagnosis present

## 2021-07-23 DIAGNOSIS — E871 Hypo-osmolality and hyponatremia: Secondary | ICD-10-CM | POA: Diagnosis present

## 2021-07-23 DIAGNOSIS — Z8619 Personal history of other infectious and parasitic diseases: Secondary | ICD-10-CM | POA: Diagnosis not present

## 2021-07-23 DIAGNOSIS — Z66 Do not resuscitate: Secondary | ICD-10-CM | POA: Diagnosis present

## 2021-07-23 DIAGNOSIS — Z8249 Family history of ischemic heart disease and other diseases of the circulatory system: Secondary | ICD-10-CM

## 2021-07-23 DIAGNOSIS — N739 Female pelvic inflammatory disease, unspecified: Secondary | ICD-10-CM | POA: Diagnosis not present

## 2021-07-23 DIAGNOSIS — T82838A Hemorrhage of vascular prosthetic devices, implants and grafts, initial encounter: Secondary | ICD-10-CM | POA: Diagnosis not present

## 2021-07-23 DIAGNOSIS — Z8616 Personal history of COVID-19: Secondary | ICD-10-CM | POA: Diagnosis not present

## 2021-07-23 DIAGNOSIS — N1832 Chronic kidney disease, stage 3b: Secondary | ICD-10-CM | POA: Diagnosis present

## 2021-07-23 DIAGNOSIS — M5410 Radiculopathy, site unspecified: Secondary | ICD-10-CM | POA: Diagnosis present

## 2021-07-23 DIAGNOSIS — N3289 Other specified disorders of bladder: Secondary | ICD-10-CM | POA: Diagnosis not present

## 2021-07-23 DIAGNOSIS — M4647 Discitis, unspecified, lumbosacral region: Secondary | ICD-10-CM | POA: Diagnosis present

## 2021-07-23 DIAGNOSIS — J40 Bronchitis, not specified as acute or chronic: Secondary | ICD-10-CM | POA: Diagnosis not present

## 2021-07-23 DIAGNOSIS — I10 Essential (primary) hypertension: Secondary | ICD-10-CM | POA: Diagnosis not present

## 2021-07-23 DIAGNOSIS — A0472 Enterocolitis due to Clostridium difficile, not specified as recurrent: Secondary | ICD-10-CM | POA: Diagnosis not present

## 2021-07-23 DIAGNOSIS — R052 Subacute cough: Secondary | ICD-10-CM | POA: Diagnosis not present

## 2021-07-23 DIAGNOSIS — M5126 Other intervertebral disc displacement, lumbar region: Secondary | ICD-10-CM | POA: Diagnosis not present

## 2021-07-23 DIAGNOSIS — J310 Chronic rhinitis: Secondary | ICD-10-CM | POA: Diagnosis present

## 2021-07-23 DIAGNOSIS — R079 Chest pain, unspecified: Secondary | ICD-10-CM | POA: Diagnosis not present

## 2021-07-23 DIAGNOSIS — J984 Other disorders of lung: Secondary | ICD-10-CM | POA: Diagnosis not present

## 2021-07-23 DIAGNOSIS — Z96612 Presence of left artificial shoulder joint: Secondary | ICD-10-CM | POA: Diagnosis present

## 2021-07-23 DIAGNOSIS — Z8669 Personal history of other diseases of the nervous system and sense organs: Secondary | ICD-10-CM | POA: Diagnosis not present

## 2021-07-23 DIAGNOSIS — K623 Rectal prolapse: Secondary | ICD-10-CM | POA: Diagnosis not present

## 2021-07-23 DIAGNOSIS — I088 Other rheumatic multiple valve diseases: Secondary | ICD-10-CM | POA: Diagnosis not present

## 2021-07-23 DIAGNOSIS — N951 Menopausal and female climacteric states: Secondary | ICD-10-CM | POA: Diagnosis not present

## 2021-07-23 LAB — CBC WITH DIFFERENTIAL/PLATELET
Abs Immature Granulocytes: 0.12 10*3/uL — ABNORMAL HIGH (ref 0.00–0.07)
Basophils Absolute: 0 10*3/uL (ref 0.0–0.1)
Basophils Relative: 0 %
Eosinophils Absolute: 0.3 10*3/uL (ref 0.0–0.5)
Eosinophils Relative: 3 %
HCT: 37.2 % (ref 36.0–46.0)
Hemoglobin: 11.7 g/dL — ABNORMAL LOW (ref 12.0–15.0)
Immature Granulocytes: 1 %
Lymphocytes Relative: 21 %
Lymphs Abs: 2.5 10*3/uL (ref 0.7–4.0)
MCH: 28 pg (ref 26.0–34.0)
MCHC: 31.5 g/dL (ref 30.0–36.0)
MCV: 89 fL (ref 80.0–100.0)
Monocytes Absolute: 1.2 10*3/uL — ABNORMAL HIGH (ref 0.1–1.0)
Monocytes Relative: 10 %
Neutro Abs: 8.1 10*3/uL — ABNORMAL HIGH (ref 1.7–7.7)
Neutrophils Relative %: 65 %
Platelets: 365 10*3/uL (ref 150–400)
RBC: 4.18 MIL/uL (ref 3.87–5.11)
RDW: 18.1 % — ABNORMAL HIGH (ref 11.5–15.5)
WBC: 12.3 10*3/uL — ABNORMAL HIGH (ref 4.0–10.5)
nRBC: 0 % (ref 0.0–0.2)

## 2021-07-23 LAB — COMPREHENSIVE METABOLIC PANEL
ALT: 17 U/L (ref 0–44)
AST: 19 U/L (ref 15–41)
Albumin: 3.6 g/dL (ref 3.5–5.0)
Alkaline Phosphatase: 89 U/L (ref 38–126)
Anion gap: 9 (ref 5–15)
BUN: 23 mg/dL (ref 8–23)
CO2: 29 mmol/L (ref 22–32)
Calcium: 10.1 mg/dL (ref 8.9–10.3)
Chloride: 95 mmol/L — ABNORMAL LOW (ref 98–111)
Creatinine, Ser: 0.9 mg/dL (ref 0.44–1.00)
GFR, Estimated: >60 mL/min (ref >60)
Glucose, Bld: 98 mg/dL (ref 70–99)
Potassium: 4 mmol/L (ref 3.5–5.1)
Sodium: 133 mmol/L — ABNORMAL LOW (ref 135–145)
Total Bilirubin: 0.4 mg/dL (ref 0.3–1.2)
Total Protein: 7 g/dL (ref 6.5–8.1)

## 2021-07-23 LAB — C-REACTIVE PROTEIN: CRP: 5 mg/dL — ABNORMAL HIGH (ref <1.0)

## 2021-07-23 LAB — SEDIMENTATION RATE: Sed Rate: 48 mm/hr — ABNORMAL HIGH (ref 0–22)

## 2021-07-23 LAB — MRSA NEXT GEN BY PCR, NASAL: MRSA by PCR Next Gen: NOT DETECTED

## 2021-07-23 MED ORDER — ACETAMINOPHEN 650 MG RE SUPP: 650.0000 mg | Freq: Four times a day (QID) | RECTAL | Status: DC | PRN

## 2021-07-23 MED ORDER — HYDROCODONE-ACETAMINOPHEN 5-325 MG PO TABS
1.0000 | ORAL_TABLET | Freq: Three times a day (TID) | ORAL | Status: DC | PRN
  Administered 2021-07-24 – 2021-07-25 (×2): 1 via ORAL
  Filled 2021-07-23 (×3): qty 1

## 2021-07-23 MED ORDER — IRBESARTAN 150 MG PO TABS: 150.0000 mg | ORAL_TABLET | Freq: Every day | ORAL | Status: DC

## 2021-07-23 MED ORDER — BRIMONIDINE TARTRATE 0.15 % OP SOLN
1.0000 [drp] | Freq: Every day | OPHTHALMIC | Status: DC
  Administered 2021-07-24 – 2021-08-06 (×14): 1 [drp] via OPHTHALMIC
  Filled 2021-07-23: qty 5

## 2021-07-23 MED ORDER — CLONAZEPAM 0.5 MG PO TABS
0.5000 mg | ORAL_TABLET | Freq: Every day | ORAL | Status: DC
  Administered 2021-07-23 – 2021-08-05 (×14): 0.5 mg via ORAL
  Filled 2021-07-23 (×14): qty 1

## 2021-07-23 MED ORDER — LEVOTHYROXINE SODIUM 100 MCG PO TABS
100.0000 ug | ORAL_TABLET | Freq: Every day | ORAL | Status: DC
  Administered 2021-07-25 – 2021-08-06 (×12): 100 ug via ORAL
  Filled 2021-07-23 (×12): qty 1

## 2021-07-23 MED ORDER — GABAPENTIN 100 MG PO CAPS
200.0000 mg | ORAL_CAPSULE | Freq: Two times a day (BID) | ORAL | Status: DC
  Administered 2021-07-23 – 2021-07-27 (×8): 200 mg via ORAL
  Filled 2021-07-23 (×8): qty 2

## 2021-07-23 MED ORDER — SERTRALINE HCL 100 MG PO TABS
100.0000 mg | ORAL_TABLET | Freq: Every morning | ORAL | Status: DC
  Administered 2021-07-25 – 2021-08-06 (×12): 100 mg via ORAL
  Filled 2021-07-23 (×13): qty 1

## 2021-07-23 MED ORDER — ONDANSETRON HCL 4 MG/2ML IJ SOLN: 4.0000 mg | Freq: Four times a day (QID) | INTRAMUSCULAR | Status: DC | PRN

## 2021-07-23 MED ORDER — LACTATED RINGERS IV SOLN: INTRAVENOUS | Status: AC

## 2021-07-23 MED ORDER — MAGNESIUM SULFATE 2 GM/50ML IV SOLN
2.0000 g | Freq: Once | INTRAVENOUS | Status: AC
  Administered 2021-07-23: 2 g via INTRAVENOUS
  Filled 2021-07-23: qty 50

## 2021-07-23 MED ORDER — ONDANSETRON HCL 4 MG PO TABS: 4.0000 mg | ORAL_TABLET | Freq: Four times a day (QID) | ORAL | Status: DC | PRN

## 2021-07-23 MED ORDER — ACETAMINOPHEN 325 MG PO TABS
650.0000 mg | ORAL_TABLET | Freq: Four times a day (QID) | ORAL | Status: DC | PRN
  Administered 2021-07-25 – 2021-07-26 (×2): 650 mg via ORAL
  Filled 2021-07-23 (×3): qty 2

## 2021-07-23 MED ORDER — LATANOPROST 0.005 % OP SOLN
1.0000 [drp] | Freq: Every day | OPHTHALMIC | Status: DC
  Administered 2021-07-23 – 2021-08-05 (×14): 1 [drp] via OPHTHALMIC
  Filled 2021-07-23: qty 2.5

## 2021-07-23 MED ORDER — SPIRONOLACTONE 25 MG PO TABS
25.0000 mg | ORAL_TABLET | Freq: Every day | ORAL | Status: DC
Start: 2021-07-24 — End: 2021-07-25
  Administered 2021-07-24 – 2021-07-25 (×2): 25 mg via ORAL
  Filled 2021-07-23 (×2): qty 1

## 2021-07-23 NOTE — Assessment & Plan Note (Addendum)
Abscess ?Infection with Enterococcus, Proteus mirabilis and Citrobacter koseri ? ?-Status post IR guided disc aspiration 3/31---fluid culture grew Enterococcus.  TEE negative for endocarditis. ?-Patient continues to have pain and elevated inflammatory markers.  Lumbosacral MRI repeated and it showed persistent discitis, osteomyelitis and L5-S1 level and worsening size of abscess in pelvis posterior to the rectum. ?-IR aspirated this fluid collection on 4/10.  Cultures growing Proteus mirabilis and Citrobacter koseri. ?Patient was initially placed on Unasyn.  Based on new culture data ID has switched her over to Zosyn. ?PICC line was placed on 4/6 3. ?Stop date for antibiotics is 5/22. ?Pain is better controlled on scheduled Vicodin.  Fentanyl patch was discontinued as it was not helping.  Pain is better controlled today.  We will continue current regimen for now.  This was discussed in detail with patient and daughter.  All of their questions were answered. ?Seen by PT and OT.  Home health is recommended.  Has been ordered. Okay for discharge today. ? ?

## 2021-07-23 NOTE — ED Provider Notes (Signed)
?[facility] ?Provider Note ? ? ?CSN: [id number] ?Arrival date & time: 07/23/21  1305 ? ?  ? ?History ? ?Chief Complaint  ?Patient presents with  ? Back Pain  ? ? ?Brooke Beasley is a 88 y.o. female. ? ? ?Back Pain ?Patient with back pain for 2 months.  Back and down leg.  Has been seen by PCP.  Began after surgery on her abdomen.  Patient had outpatient MRI that showed potential discitis no fevers but states she has chills at times.  No numbness weakness.  Pain worse with movements.  Occasionally has a cough. ?  ?Past Medical History:  ?Diagnosis Date  ? Anxiety   ? C. difficile diarrhea 07/11/2018  ? S/p multiple oral vanc treatments (course and tapers) and completed Zinplava monoclonal Ab treatment (05/10/2019) Fecal transplant on hold during [place] pandemic  ? CAP (community acquired pneumonia) 12/22/2017  ? Carotid stenosis 10/18/2014  ? R 1-39%, L 40-59%, rpt 1 yr (09/2014)   ? CKD (chronic kidney disease) stage 3, GFR 30-59 ml/min (HCC) 08/02/2014  ? Clostridioides difficile infection   ? hx 2020  ? COVID-19 virus infection 10/13/2020  ? Degenerative disc disease   ? LS  ? Depression   ? nervous breakdonw in 1964-out of work for a year  ? Diverticulosis   ? severe by colonoscopy  ? Family history of adverse reaction to anesthesia   ? n/v  ? GERD (gastroesophageal reflux disease)   ? Glaucoma   ? Heart murmur 08/2011  ? mitral regurge - on echo   ? History of hiatal hernia   ? History of shingles   ? HTN (hypertension)   ? Hyperlipidemia   ? Hypertension   ? Hypothyroidism   ? IBS 11/02/2006  ? Intestinal bacterial overgrowth   ? In small colon  ? Lower GI bleed 12/2020  ? thought diverticular complicated by ABLA with syncope and orbital fracture s/p hospitalization  ? Maxillary fracture ([place]) 04/08/2012  ? Osteoarthritis 2016  ? Jefm Bryant)  ? Osteoporosis   ? dexa 2011  ? Pseudogout 2016  ? shoulders ([place]og[place])  ? ?Past Surgical History:  ?Procedure Laterality Date  ? barium enema   2012  ? severe diverticulosis, redundant colon  ? Bowel obstruction  1999  ? no surgery in hosp x 3 days  ? COLONOSCOPY  1. 1999  2. 11/04  ? 1. Not finished  2. Slight hemorrhage rectosigmoid area, severe sig diverticulosis  ? COLONOSCOPY WITH PROPOFOL N/A 09/10/2019  ? SSP with dysplasia, TA, rpt 3 yrs (Vanga, Tally Due, MD)  ? Dexa  1. [id number]   2. 9/04  3. 3/08   ? 1. OP  2. OP, borderline, spine -2.44T  3. decreased BMD-OP  ? DG KNEE 1-2 VIEWS BILAT    ? LS x-ray with degenerative disc and facet change  ? ESOPHAGOGASTRODUODENOSCOPY    ? Negative  ? FLEXIBLE SIGMOIDOSCOPY N/A 01/16/2021  ? Procedure: FLEXIBLE SIGMOIDOSCOPY;  Surgeon: Gatha Mayer, MD;  Location: [facility] ENDOSCOPY;  Service: Endoscopy;  Laterality: N/A;  or unsedated  ? Hemorrhoid procedure  07/2006  ? PROCTOSCOPY N/A 05/26/2021  ? Procedure: RIGID PROCTOSCOPY;  Surgeon: Michael Boston, MD;  Location: [facility] ORS;  Service: General;  Laterality: N/A;  ? RECTOPEXY N/A 05/26/2021  ? Procedure: RECTOPEXY;  Surgeon: Michael Boston, MD;  Location: [facility] ORS;  Service: General;  Laterality: N/A;  ? TONSILLECTOMY    ? TOTAL SHOULDER ARTHROPLASTY Left 11/27/2015  ? Marshall Cork Poggi,  MD  ? TOTAL SHOULDER REVISION Left 03/16/2016  ? Procedure: TOTAL SHOULDER REVISION;  Surgeon: Corky Mull, MD;  Location: [facility] ORS;  Service: Orthopedics;  Laterality: Left;  ? US ECHOCARDIOGRAPHY  07/2011  ? Normal systolic fxn with EF 50-93%.  Focal basal septal hypertrophy.  Mild diastolic dysfunction.  Mild MR.  ? XI ROBOTIC ASSISTED LOWER ANTERIOR RESECTION N/A 05/26/2021  ? Procedure: ROBOTIC LOW ANTERIOR RECTOSIGMOID RESECTION; ASSESSMENT OF TISSUE PERFUSION WITH FIREFLY;  Surgeon: Michael Boston, MD;  Location: [facility] ORS;  Service: General;  Laterality: N/A;  ? ? ? ? ?Home Medications ?Prior to Admission medications   ?Medication Sig Start Date End Date Taking? Authorizing Provider  ?acetaminophen (TYLENOL) 500 MG tablet Take 500-1,000 mg by mouth every 6 (six) hours as needed for  moderate pain. Depends on pain    [provider]  ?albuterol (PROVENTIL HFA;VENTOLIN HFA) 108 (90 Base) MCG/ACT inhaler Inhale 2 puffs into the lungs every 6 (six) hours as needed for wheezing or shortness of breath. 01/12/18   [name], MD  ?[facility] P 0.1 % SOLN Place 1 drop into both eyes in the morning. 03/10/21   [provider]  ?benzonatate (TESSALON) 100 MG capsule Take 1 capsule (100 mg total) by mouth 3 (three) times daily as needed for cough. 06/03/21   [name], MD  ?bimatoprost (LUMIGAN) 0.01 % SOLN Place 1 drop into both eyes at bedtime.    [provider]  ?Bioflavonoid Products (VITAMIN C) CHEW Chew 1 tablet by mouth daily.    [provider]  ?bisacodyl (DULCOLAX) 5 MG EC tablet Take 1 tablet (5 mg total) by mouth daily as needed for moderate constipation. 01/16/21 01/16/22  Lavina Hamman, MD  ?cholecalciferol (VITAMIN D) 1000 units tablet Take 1,000 Units by mouth daily.    [provider]  ?clonazePAM (KLONOPIN) 0.5 MG tablet TAKE 1 TABLET BY MOUTH AT BEDTIME 06/30/21   [name], MD  ?colchicine 0.6 MG tablet Take 1 tablet (0.6 mg total) by mouth daily as needed (gout flare). 12/05/20   [name], MD  ?denosumab (PROLIA) 60 MG/ML SOSY injection Inject 60 mg into the skin every 6 (six) months. 05/06/20   [name], MD  ?Famotidine-Ca Carb-Mag Hydrox (PEPCID COMPLETE PO) Take 1 tablet by mouth daily as needed (indigestion).    [provider]  ?furosemide (LASIX) 20 MG tablet Take 20 mg by mouth daily as needed for edema or fluid.    [provider]  ?gabapentin (NEURONTIN) 100 MG capsule Take 2 capsules by mouth 2 times daily. 05/29/21   Michael Boston, MD  ?HYDROcodone-acetaminophen (NORCO/VICODIN) 5-325 MG tablet Take 1 tablet by mouth 3 (three) times daily as needed for moderate pain or severe pain. 07/13/21   [name], MD  ?hydrocortisone 2.5 % cream Place 1 application rectally daily as  needed (hemorrhoids). 10/23/18   [provider]  ?Infant Care Products [facility]) OINT Apply 1 application topically 2 (two) times daily as needed. 06/16/21   Venia Carbon, MD  ?irbesartan (AVAPRO) 150 MG tablet Take 1 tablet (150 mg total) by mouth daily. 03/31/21   [name], MD  ?Iron-FA-B Cmp-C-Biot-Probiotic (FUSION PLUS) CAPS Take 1 capsule by mouth once daily 06/22/21   Lin Landsman, MD  ?levothyroxine (SYNTHROID) 100 MCG tablet Take 1 tablet by mouth once daily 04/08/21   [name], MD  ?Misc Natural Products (OSTEO BI-FLEX TRIPLE STRENGTH PO) Take 1 tablet by mouth daily.    [provider]  ?  Multiple Vitamin (MULTIVITAMIN ADULT PO) Take by mouth daily.    [provider]  ?omeprazole (PRILOSEC) 20 MG capsule Take 20 mg by mouth daily as needed (heartburn).    [provider]  ?ondansetron (ZOFRAN) 4 MG tablet Take 1 tablet by mouth every 8 hours as needed for nausea or refractory nausea / vomiting. 05/26/21   Michael Boston, MD  ?polyethylene glycol (MIRALAX / GLYCOLAX) 17 g packet Take 17 g by mouth daily.    [provider]  ?predniSONE (DELTASONE) 20 MG tablet Take two tablets daily for 4 days followed by one tablet daily for 4 days 07/13/21   [name], MD  ?sertraline (ZOLOFT) 100 MG tablet Take 100 mg by mouth in the morning. 10/05/20   [name], MD  ?Simethicone 180 MG CAPS Take 1 capsule by mouth daily as needed (indigestion).    [provider]  ?spironolactone (ALDACTONE) 25 MG tablet Take 1 tablet (25 mg total) by mouth daily. 03/31/21   [name], MD  ?traMADol (ULTRAM) 50 MG tablet Take 50 mg by mouth every 6 (six) hours as needed for moderate pain.    [provider]  ?VOLTAREN 1 % GEL APPLY ONE APPLICATION TOPICALLY 3 TIMES DAILY ?Patient taking differently: Apply 2 g topically 3 (three) times daily as needed (arthritis pain). 09/26/14   [name], MD  ?Wheat Dextrin (BENEFIBER  DRINK MIX PO) Take 15 mLs by mouth daily.    [provider]  ?   ? ?Allergies    ?Amlodipine, Ace inhibitors, Carvedilol, Losartan, Other, Paroxetine hcl, Risedronate sodium, Simvastatin, Topr

## 2021-07-23 NOTE — Telephone Encounter (Signed)
Agree with ER evaluation for MRI findings. Thank you.  ?

## 2021-07-23 NOTE — H&P (Addendum)
?History and Physical  ? ? ?Brooke Beasley BRA:[id number] DOB: [birth year] DOA: 07/23/2021 ? ?DOS: the patient was seen and examined on 07/23/2021 ? ?PCP: [name], MD  ? ?Patient coming from: Home ? ?I have personally briefly reviewed patient's old medical records in [place] ? ?CC: back pain, sciatica, abnormal MRI ?HPI: ?88 year old white female history of hypertension, valvular heart disease with MR/aortic stenosis, hypothyroidism, hyperlipidemia presents to the hospital from the ER.  Patient's had about a 22-monthhistory of increasing low back pain with radiation down her right leg/sciatica.  She states this started after her rectal prolapse surgery she had in January 2023.  Patient admits to nightly chills with soaking night sweats.  She states that she wakes up every morning with her pajamas soaked in sweat.  Daytime temperature has only been about 99.  She denies any nausea or vomiting.  No diarrhea.  She has been on about 2 rounds of antibiotics for upper respiratory illnesses since January.  This was Augmentin at the beginning part of March 2023. ? ?Patient had an MRI done as an outpatient today.  This showed a possible L5-S1 discitis along with T1 abnormality in the L5-S1 vertebrae.  Highly suspicious for discitis osteomyelitis.  [facility] ? ?Of note, patient was also found to be in new onset atrial fibrillation.  Rate is controlled.  Patient denies taking any anticoagulants.  She denies any prior history of atrial fibrillation.  She does have a history of valvular heart disease.  ? ?ED Course: EKG shows new onset afib ? ?Review of Systems:  ?Review of Systems  ?Constitutional:  Positive for chills.  ?     Night sweats  ?HENT: Negative.    ?Eyes: Negative.   ?Respiratory: Negative.    ?Cardiovascular: Negative.   ?Gastrointestinal: Negative.   ?Genitourinary: Negative.   ?Musculoskeletal:  Positive for back pain.  ?     Right side back  pain with radiation down right leg to level of her toes  ?Skin: Negative.   ?Neurological:  Positive for focal weakness.  ?     In right leg. Sometimes feels like her right leg is going to give out. ? ?Denies any saddle anesthesia. ?No bowel or bladder incontinence ?  ?Endo/Heme/Allergies: Negative.   ?Psychiatric/Behavioral: Negative.    ?All other systems reviewed and are negative. ? ?Past Medical History:  ?Diagnosis Date  ? Anxiety   ? C. difficile diarrhea 07/11/2018  ? S/p multiple oral vanc treatments (course and tapers) and completed Zinplava monoclonal Ab treatment (05/10/2019) [place]  ? CAP (community acquired pneumonia) 12/22/2017  ? Carotid stenosis 10/18/2014  ? R 1-39%, L 40-59%, rpt 1 yr (09/2014)   ? CKD (chronic kidney disease) stage 3, GFR 30-59 ml/min (HCC) 08/02/2014  ? Closed fracture of right orbit ([place] 02/10/2021  ? Clostridioides difficile infection   ? hx 2020  ? COVID-19 virus infection 10/13/2020  ? Degenerative disc disease   ? LS  ? Depression   ? nervous breakdonw in 1964-out of work for a year  ? Diverticulosis   ? severe by colonoscopy  ? Family history of adverse reaction to anesthesia   ? n/v  ? GERD (gastroesophageal reflux disease)   ? Glaucoma   ? Heart murmur 08/2011  ? mitral regurge - on echo   ? History of hiatal hernia   ? History of shingles   ? HTN (hypertension)   ?  Hyperlipidemia   ? Hypertension   ? Hypothyroidism   ? IBS 11/02/2006  ? Intestinal bacterial overgrowth   ? In small colon  ? Left shoulder pain 11/04/2014  ? Lower GI bleed 12/2020  ? thought diverticular complicated by ABLA with syncope and orbital fracture s/p hospitalization  ? Maxillary fracture ([place]) 04/08/2012  ? Osteoarthritis 2016  ? Jefm Bryant)  ? Osteoporosis   ? dexa 2011  ? Pseudogout 2016  ? shoulders ([place])  ? ? ?Past Surgical History:  ?Procedure Laterality Date  ? barium enema  2012  ? severe diverticulosis, redundant colon  ? Bowel obstruction  1999  ?  no surgery in hosp x 3 days  ? COLONOSCOPY  1. 1999  2. 11/04  ? 1. Not finished  2. Slight hemorrhage rectosigmoid area, severe sig diverticulosis  ? COLONOSCOPY WITH PROPOFOL N/A 09/10/2019  ? SSP with dysplasia, TA, rpt 3 yrs (Vanga, Tally Due, MD)  ? Dexa  1. 5364-6803   2. 9/04  3. 3/08   ? 1. OP  2. OP, borderline, spine -2.44T  3. decreased BMD-OP  ? DG KNEE 1-2 VIEWS BILAT    ? LS x-ray with degenerative disc and facet change  ? ESOPHAGOGASTRODUODENOSCOPY    ? Negative  ? FLEXIBLE SIGMOIDOSCOPY N/A 01/16/2021  ? Procedure: FLEXIBLE SIGMOIDOSCOPY;  Surgeon: Gatha Mayer, MD;  Location: [facility] ENDOSCOPY;  Service: Endoscopy;  Laterality: N/A;  or unsedated  ? Hemorrhoid procedure  07/2006  ? PROCTOSCOPY N/A 05/26/2021  ? Procedure: RIGID PROCTOSCOPY;  Surgeon: Michael Boston, MD;  Location: [facility] ORS;  Service: General;  Laterality: N/A;  ? RECTOPEXY N/A 05/26/2021  ? Procedure: RECTOPEXY;  Surgeon: Michael Boston, MD;  Location: [facility] ORS;  Service: General;  Laterality: N/A;  ? TONSILLECTOMY    ? TOTAL SHOULDER ARTHROPLASTY Left 11/27/2015  ? Corky Mull, MD  ? TOTAL SHOULDER REVISION Left 03/16/2016  ? Procedure: TOTAL SHOULDER REVISION;  Surgeon: Corky Mull, MD;  Location: [facility] ORS;  Service: Orthopedics;  Laterality: Left;  ? US ECHOCARDIOGRAPHY  07/2011  ? Normal systolic fxn with EF 21-22%.  Focal basal septal hypertrophy.  Mild diastolic dysfunction.  Mild MR.  ? XI ROBOTIC ASSISTED LOWER ANTERIOR RESECTION N/A 05/26/2021  ? Procedure: ROBOTIC LOW ANTERIOR RECTOSIGMOID RESECTION; ASSESSMENT OF TISSUE PERFUSION WITH FIREFLY;  Surgeon: Michael Boston, MD;  Location: [facility] ORS;  Service: General;  Laterality: N/A;  ? ? ? reports that she has never smoked. She has never used smokeless tobacco. She reports that she does not drink alcohol and does not use drugs. ? ?Allergies  ?Allergen Reactions  ? Amlodipine Other (See Comments)  ?  Stomach pains--"feel like my insides are on fire"  ? Ace Inhibitors Cough  ?  Carvedilol Other (See Comments)  ?  fatigue  ? Losartan Other (See Comments)  ?  Cr bumped  ? Other Other (See Comments)  ?  No seeds or corn because of IBS  ? Paroxetine Hcl Other (See Comments)  ?  Not effective - felt ill on this medicine  ? Risedronate Sodium   ?  REACTION: joint pain  ? Simvastatin   ?  REACTION: the full 20 mg pill causes leg pain- can tol 10 mg  ? Toprol Xl [Metoprolol] Diarrhea  ?  Diarrhea and weakness  ? Tramadol Other (See Comments)  ?  Felt like passing out  ? Alendronate Sodium Palpitations  ? Influenza Vaccines Rash and Other (See Comments)  ?  Led to  mental breakdown in 1960s ?  ? Silenor [Doxepin Hcl] Palpitations  ?  Hallucinations and racing heart  ? Sulfonamide Derivatives Rash  ? ? ?Family History  ?Problem Relation Age of Onset  ? Diabetes Brother   ?     post-op  ? Coronary artery disease Brother   ? Diabetes Paternal Grandfather   ? Breast cancer Cousin   ? ? ?Prior to Admission medications   ?Medication Sig Start Date End Date Taking? Authorizing Provider  ?acetaminophen (TYLENOL) 500 MG tablet Take 500-1,000 mg by mouth every 6 (six) hours as needed for moderate pain. Depends on pain    [provider]  ?albuterol (PROVENTIL HFA;VENTOLIN HFA) 108 (90 Base) MCG/ACT inhaler Inhale 2 puffs into the lungs every 6 (six) hours as needed for wheezing or shortness of breath. 01/12/18   [name], MD  ?[facility] P 0.1 % SOLN Place 1 drop into both eyes in the morning. 03/10/21   [provider]  ?benzonatate (TESSALON) 100 MG capsule Take 1 capsule (100 mg total) by mouth 3 (three) times daily as needed for cough. 06/03/21   [name], MD  ?bimatoprost (LUMIGAN) 0.01 % SOLN Place 1 drop into both eyes at bedtime.    [provider]  ?Bioflavonoid Products (VITAMIN C) CHEW Chew 1 tablet by mouth daily.    [provider]  ?bisacodyl (DULCOLAX) 5 MG EC tablet Take 1 tablet (5 mg total) by mouth daily as needed for moderate constipation.  01/16/21 01/16/22  Lavina Hamman, MD  ?cholecalciferol (VITAMIN D) 1000 units tablet Take 1,000 Units by mouth daily.    [provider]  ?clonazePAM (KLONOPIN) 0.5 MG tablet TAKE 1 TABLET BY MOUTH AT B

## 2021-07-23 NOTE — Telephone Encounter (Signed)
Malachy Mood with [place] radiology called report on MR LS w/o contrast; sending note to Dr Darnell Level who is out of office today, Dr Damita Dunnings who is in office today, Lattie Haw CMA and [place] CMA. Report is in [place] and will teams Lattie Haw and [place]. ?

## 2021-07-23 NOTE — Assessment & Plan Note (Addendum)
-  Likely due to infection.  ?-CHA2DS2-VASc score=5.  ?Started on beta-blocker and Eliquis.  No bleeding noted.  ?Eliquis was started but placed on hold due to need for procedure.  Will resume today. ?Echocardiogram shows normal systolic function with mild to moderate aortic valve stenosis. ?TSH noted to be mildly elevated at 6.63.  Free T4 0.77.  Patient already on levothyroxine for hypothyroidism.  No dose changes needed. ?-Outpatient follow-up Dr. [name] for aortic valve stenosis as needed ? ?

## 2021-07-23 NOTE — ED Triage Notes (Signed)
Pt arrives to ED with c/o of back pain and leg pain for two months since her surgery. Pt reports she had an outpatient MRI of her lumbar spine today that showed possible osteomyelitis/discitis in her L5/S1.   ?

## 2021-07-23 NOTE — Telephone Encounter (Signed)
Please call pt.  I talked with Dr. Darnell Level.  The concern is for an infection in a disc in her lower spine and she may need IV antibiotics for this.  He and I agree that it is best to go to the ER for evaluation.  Thanks.  ?

## 2021-07-23 NOTE — Assessment & Plan Note (Addendum)
Continue levothyroxine.  Mildly elevated TSH noted with normal free T4. ?

## 2021-07-23 NOTE — Subjective & Objective (Signed)
CC: back pain, sciatica, abnormal MRI ?HPI: ?88 year old white female history of hypertension, valvular heart disease with MR/aortic stenosis, hypothyroidism, hyperlipidemia presents to the hospital from the ER.  Patient's had about a 15-monthhistory of increasing low back pain with radiation down her right leg/sciatica.  She states this started after her rectal prolapse surgery she had in January 2023.  Patient admits to nightly chills with soaking night sweats.  She states that she wakes up every morning with her pajamas soaked in sweat.  Daytime temperature has only been about 99.  She denies any nausea or vomiting.  No diarrhea.  She has been on about 2 rounds of antibiotics for upper respiratory illnesses since January.  This was Augmentin at the beginning part of March 2023. ? ?Patient had an MRI done as an outpatient today.  This showed a possible L5-S1 discitis along with T1 abnormality in the L5-S1 vertebrae.  Highly suspicious for discitis osteomyelitis.  [facility] ? ?Of note, patient was also found to be in new onset atrial fibrillation.  Rate is controlled.  Patient denies taking any anticoagulants.  She denies any prior history of atrial fibrillation.  She does have a history of valvular heart disease. ?

## 2021-07-23 NOTE — ED Notes (Signed)
ED Provider at bedside. 

## 2021-07-23 NOTE — Assessment & Plan Note (Deleted)
Stable

## 2021-07-23 NOTE — Assessment & Plan Note (Addendum)
Hyponatremia ? ?Renal function stable for the most part. ?

## 2021-07-23 NOTE — Telephone Encounter (Signed)
Spoke with pt/pt's daughter, [name], relaying Dr. Josefine Class message.  Pt was reluctant at first due possible long wait time.  But I recommended trying [facility] [address] [employer].  She agrees to try this location, so [name] will take her.  ?

## 2021-07-23 NOTE — Progress Notes (Signed)
Pt. Arrived to floor AO x4, vitals stable. [facility] notified of arrival ? ?

## 2021-07-23 NOTE — Progress Notes (Signed)
Plan of Care Note for accepted transfer ? ? ?Patient: Brooke Beasley MRN: [id number]   [place]: 07/23/2021 ? ?Facility requesting transfer: [facility] ?Requesting Provider: Dr. Alvino Chapel ?Reason for transfer: MRI showing possible osteo/discitis in L5/S1 ?Facility course: 88 year old female with history of HTN, HLD, prediabetes, IDA, hypothyroidism, CKD stage 3, anxiety and depression presenting to ED after abnormal MRI of spine showing high suspicion for L5-S1 discitis/osteo. Also has signal abnormality posterior to the L5 vertebral body may reflect a moderate-sized cranially migrated disc extrusion and or epidural abscess/phlegmon.  ? ?PCP obtained imaging due to right sided low back pain with right sided sciatica and numbness >2 months. She has no hx of back surgeries. Had rectal prolapse surgery on 05/26/21.  ? ? ?Also possible afib, waiting on EKG. Rate controlled. Holding any AC in case she needs a biopsy.  ? ? ?Vitals stable ?Labs: wbc: 12.3, esr/crp pending, BC x2.  ? ?Will need to review EKG to determine heart rhythm, consult neurosurgery and likely needs MRI with contrast.  ? ?Plan of care: ?The patient is accepted for admission to Tele[facility] unit, at [facility]..  ? ? ?Author: ?Orma Flaming, MD ?07/23/2021 ? ?Check [email or web address] for on-call coverage. ? ?Nursing staff, Please call [place] number on Amion as soon as patient's arrival, so appropriate admitting provider can evaluate the pt. ?

## 2021-07-23 NOTE — Assessment & Plan Note (Deleted)
Chronic. But with new onset afib, concern for subacute endocarditis. ?

## 2021-07-23 NOTE — Assessment & Plan Note (Addendum)
Secondary to discitis/abscess.  Improved with change in narcotics from fentanyl patch to oral agents.  Able to move her right leg better than before.  Ambulated with physical therapy.  Home health has been ordered.   ?

## 2021-07-23 NOTE — Assessment & Plan Note (Addendum)
Remains on metoprolol.  Will not be too aggressive with blood pressure control as the pain is contributing to high readings.  Can be monitored in the outpatient setting by PCP. ?Allergy noted to amlodipine, ACE inhibitor, carvedilol, losartan. ?

## 2021-07-24 ENCOUNTER — Inpatient Hospital Stay (HOSPITAL_COMMUNITY): Payer: PPO

## 2021-07-24 ENCOUNTER — Observation Stay (HOSPITAL_COMMUNITY): Payer: PPO

## 2021-07-24 DIAGNOSIS — J984 Other disorders of lung: Secondary | ICD-10-CM | POA: Diagnosis not present

## 2021-07-24 DIAGNOSIS — I4892 Unspecified atrial flutter: Secondary | ICD-10-CM | POA: Diagnosis present

## 2021-07-24 DIAGNOSIS — B999 Unspecified infectious disease: Secondary | ICD-10-CM | POA: Diagnosis not present

## 2021-07-24 DIAGNOSIS — J9811 Atelectasis: Secondary | ICD-10-CM | POA: Diagnosis present

## 2021-07-24 DIAGNOSIS — M4640 Discitis, unspecified, site unspecified: Secondary | ICD-10-CM | POA: Diagnosis not present

## 2021-07-24 DIAGNOSIS — B952 Enterococcus as the cause of diseases classified elsewhere: Secondary | ICD-10-CM | POA: Diagnosis present

## 2021-07-24 DIAGNOSIS — Z8616 Personal history of COVID-19: Secondary | ICD-10-CM | POA: Diagnosis not present

## 2021-07-24 DIAGNOSIS — T8143XA Infection following a procedure, organ and space surgical site, initial encounter: Secondary | ICD-10-CM | POA: Diagnosis not present

## 2021-07-24 DIAGNOSIS — R059 Cough, unspecified: Secondary | ICD-10-CM | POA: Diagnosis not present

## 2021-07-24 DIAGNOSIS — K611 Rectal abscess: Secondary | ICD-10-CM | POA: Diagnosis present

## 2021-07-24 DIAGNOSIS — N3289 Other specified disorders of bladder: Secondary | ICD-10-CM | POA: Diagnosis not present

## 2021-07-24 DIAGNOSIS — E875 Hyperkalemia: Secondary | ICD-10-CM | POA: Diagnosis not present

## 2021-07-24 DIAGNOSIS — A0472 Enterocolitis due to Clostridium difficile, not specified as recurrent: Secondary | ICD-10-CM | POA: Diagnosis not present

## 2021-07-24 DIAGNOSIS — I38 Endocarditis, valve unspecified: Secondary | ICD-10-CM | POA: Diagnosis not present

## 2021-07-24 DIAGNOSIS — M519 Unspecified thoracic, thoracolumbar and lumbosacral intervertebral disc disorder: Secondary | ICD-10-CM | POA: Diagnosis present

## 2021-07-24 DIAGNOSIS — Z452 Encounter for adjustment and management of vascular access device: Secondary | ICD-10-CM | POA: Diagnosis not present

## 2021-07-24 DIAGNOSIS — G629 Polyneuropathy, unspecified: Secondary | ICD-10-CM | POA: Diagnosis present

## 2021-07-24 DIAGNOSIS — R079 Chest pain, unspecified: Secondary | ICD-10-CM | POA: Diagnosis not present

## 2021-07-24 DIAGNOSIS — A419 Sepsis, unspecified organism: Secondary | ICD-10-CM

## 2021-07-24 DIAGNOSIS — Z66 Do not resuscitate: Secondary | ICD-10-CM | POA: Diagnosis present

## 2021-07-24 DIAGNOSIS — I083 Combined rheumatic disorders of mitral, aortic and tricuspid valves: Secondary | ICD-10-CM | POA: Diagnosis not present

## 2021-07-24 DIAGNOSIS — I4891 Unspecified atrial fibrillation: Secondary | ICD-10-CM

## 2021-07-24 DIAGNOSIS — E871 Hypo-osmolality and hyponatremia: Secondary | ICD-10-CM | POA: Diagnosis present

## 2021-07-24 DIAGNOSIS — I10 Essential (primary) hypertension: Secondary | ICD-10-CM | POA: Diagnosis not present

## 2021-07-24 DIAGNOSIS — K623 Rectal prolapse: Secondary | ICD-10-CM | POA: Diagnosis not present

## 2021-07-24 DIAGNOSIS — R052 Subacute cough: Secondary | ICD-10-CM | POA: Diagnosis not present

## 2021-07-24 DIAGNOSIS — Z8619 Personal history of other infectious and parasitic diseases: Secondary | ICD-10-CM | POA: Diagnosis not present

## 2021-07-24 DIAGNOSIS — K219 Gastro-esophageal reflux disease without esophagitis: Secondary | ICD-10-CM | POA: Diagnosis present

## 2021-07-24 DIAGNOSIS — F419 Anxiety disorder, unspecified: Secondary | ICD-10-CM | POA: Diagnosis present

## 2021-07-24 DIAGNOSIS — I129 Hypertensive chronic kidney disease with stage 1 through stage 4 chronic kidney disease, or unspecified chronic kidney disease: Secondary | ICD-10-CM | POA: Diagnosis present

## 2021-07-24 DIAGNOSIS — E038 Other specified hypothyroidism: Secondary | ICD-10-CM | POA: Diagnosis present

## 2021-07-24 DIAGNOSIS — Z833 Family history of diabetes mellitus: Secondary | ICD-10-CM | POA: Diagnosis not present

## 2021-07-24 DIAGNOSIS — N1831 Chronic kidney disease, stage 3a: Secondary | ICD-10-CM | POA: Diagnosis present

## 2021-07-24 DIAGNOSIS — B964 Proteus (mirabilis) (morganii) as the cause of diseases classified elsewhere: Secondary | ICD-10-CM | POA: Diagnosis present

## 2021-07-24 DIAGNOSIS — M4646 Discitis, unspecified, lumbar region: Secondary | ICD-10-CM | POA: Diagnosis present

## 2021-07-24 DIAGNOSIS — A0471 Enterocolitis due to Clostridium difficile, recurrent: Secondary | ICD-10-CM | POA: Diagnosis not present

## 2021-07-24 DIAGNOSIS — I482 Chronic atrial fibrillation, unspecified: Secondary | ICD-10-CM | POA: Diagnosis present

## 2021-07-24 DIAGNOSIS — M5126 Other intervertebral disc displacement, lumbar region: Secondary | ICD-10-CM | POA: Diagnosis not present

## 2021-07-24 DIAGNOSIS — J90 Pleural effusion, not elsewhere classified: Secondary | ICD-10-CM | POA: Diagnosis not present

## 2021-07-24 DIAGNOSIS — L0291 Cutaneous abscess, unspecified: Secondary | ICD-10-CM | POA: Diagnosis not present

## 2021-07-24 DIAGNOSIS — M4627 Osteomyelitis of vertebra, lumbosacral region: Secondary | ICD-10-CM | POA: Diagnosis present

## 2021-07-24 DIAGNOSIS — D638 Anemia in other chronic diseases classified elsewhere: Secondary | ICD-10-CM | POA: Diagnosis not present

## 2021-07-24 DIAGNOSIS — N739 Female pelvic inflammatory disease, unspecified: Secondary | ICD-10-CM | POA: Diagnosis not present

## 2021-07-24 DIAGNOSIS — E785 Hyperlipidemia, unspecified: Secondary | ICD-10-CM | POA: Diagnosis present

## 2021-07-24 DIAGNOSIS — K449 Diaphragmatic hernia without obstruction or gangrene: Secondary | ICD-10-CM | POA: Diagnosis not present

## 2021-07-24 DIAGNOSIS — F32A Depression, unspecified: Secondary | ICD-10-CM | POA: Diagnosis present

## 2021-07-24 DIAGNOSIS — I088 Other rheumatic multiple valve diseases: Secondary | ICD-10-CM | POA: Diagnosis not present

## 2021-07-24 DIAGNOSIS — M48061 Spinal stenosis, lumbar region without neurogenic claudication: Secondary | ICD-10-CM | POA: Diagnosis not present

## 2021-07-24 DIAGNOSIS — J40 Bronchitis, not specified as acute or chronic: Secondary | ICD-10-CM | POA: Diagnosis not present

## 2021-07-24 DIAGNOSIS — Z91048 Other nonmedicinal substance allergy status: Secondary | ICD-10-CM | POA: Diagnosis not present

## 2021-07-24 DIAGNOSIS — Z888 Allergy status to other drugs, medicaments and biological substances status: Secondary | ICD-10-CM | POA: Diagnosis not present

## 2021-07-24 DIAGNOSIS — K651 Peritoneal abscess: Secondary | ICD-10-CM | POA: Diagnosis not present

## 2021-07-24 DIAGNOSIS — M4647 Discitis, unspecified, lumbosacral region: Secondary | ICD-10-CM | POA: Diagnosis present

## 2021-07-24 DIAGNOSIS — Z8669 Personal history of other diseases of the nervous system and sense organs: Secondary | ICD-10-CM | POA: Diagnosis not present

## 2021-07-24 DIAGNOSIS — Z882 Allergy status to sulfonamides status: Secondary | ICD-10-CM | POA: Diagnosis not present

## 2021-07-24 DIAGNOSIS — E039 Hypothyroidism, unspecified: Secondary | ICD-10-CM | POA: Diagnosis not present

## 2021-07-24 DIAGNOSIS — M419 Scoliosis, unspecified: Secondary | ICD-10-CM | POA: Diagnosis not present

## 2021-07-24 LAB — CBC WITH DIFFERENTIAL/PLATELET
Abs Immature Granulocytes: 0.11 10*3/uL — ABNORMAL HIGH (ref 0.00–0.07)
Basophils Absolute: 0 10*3/uL (ref 0.0–0.1)
Basophils Relative: 0 %
Eosinophils Absolute: 0.4 10*3/uL (ref 0.0–0.5)
Eosinophils Relative: 3 %
HCT: 37.1 % (ref 36.0–46.0)
Hemoglobin: 12.3 g/dL (ref 12.0–15.0)
Immature Granulocytes: 1 %
Lymphocytes Relative: 10 %
Lymphs Abs: 1.6 10*3/uL (ref 0.7–4.0)
MCH: 29.2 pg (ref 26.0–34.0)
MCHC: 33.2 g/dL (ref 30.0–36.0)
MCV: 88.1 fL (ref 80.0–100.0)
Monocytes Absolute: 1.5 10*3/uL — ABNORMAL HIGH (ref 0.1–1.0)
Monocytes Relative: 9 %
Neutro Abs: 12.5 10*3/uL — ABNORMAL HIGH (ref 1.7–7.7)
Neutrophils Relative %: 77 %
Platelets: 350 10*3/uL (ref 150–400)
RBC: 4.21 MIL/uL (ref 3.87–5.11)
RDW: 18 % — ABNORMAL HIGH (ref 11.5–15.5)
WBC: 16.1 10*3/uL — ABNORMAL HIGH (ref 4.0–10.5)
nRBC: 0 % (ref 0.0–0.2)

## 2021-07-24 LAB — COMPREHENSIVE METABOLIC PANEL
ALT: 18 U/L (ref 0–44)
AST: 22 U/L (ref 15–41)
Albumin: 2.9 g/dL — ABNORMAL LOW (ref 3.5–5.0)
Alkaline Phosphatase: 92 U/L (ref 38–126)
Anion gap: 9 (ref 5–15)
BUN: 15 mg/dL (ref 8–23)
CO2: 26 mmol/L (ref 22–32)
Calcium: 8.9 mg/dL (ref 8.9–10.3)
Chloride: 97 mmol/L — ABNORMAL LOW (ref 98–111)
Creatinine, Ser: 1.04 mg/dL — ABNORMAL HIGH (ref 0.44–1.00)
GFR, Estimated: 52 mL/min — ABNORMAL LOW (ref >60)
Glucose, Bld: 119 mg/dL — ABNORMAL HIGH (ref 70–99)
Potassium: 4 mmol/L (ref 3.5–5.1)
Sodium: 132 mmol/L — ABNORMAL LOW (ref 135–145)
Total Bilirubin: 0.6 mg/dL (ref 0.3–1.2)
Total Protein: 6.5 g/dL (ref 6.5–8.1)

## 2021-07-24 LAB — ECHOCARDIOGRAM COMPLETE
AR max vel: 1.28 cm2
AV Area VTI: 1.23 cm2
AV Area mean vel: 1.28 cm2
AV Mean grad: 8 mmHg
AV Peak grad: 13.2 mmHg
Ao pk vel: 1.82 m/s
Calc EF: 75.6 %
Height: 61 in
MV M vel: 4.33 m/s
MV Peak grad: 75 mmHg
S' Lateral: 2.1 cm
Single Plane A2C EF: 78.7 %
Single Plane A4C EF: 69.4 %
Weight: 2560 oz

## 2021-07-24 LAB — MAGNESIUM: Magnesium: 2.2 mg/dL (ref 1.7–2.4)

## 2021-07-24 LAB — PROTIME-INR
INR: 1.1 (ref 0.8–1.2)
Prothrombin Time: 13.8 seconds (ref 11.4–15.2)

## 2021-07-24 LAB — TSH: TSH: 12.281 u[IU]/mL — ABNORMAL HIGH (ref 0.350–4.500)

## 2021-07-24 MED ORDER — SODIUM CHLORIDE 0.9 % IV SOLN: 1.0000 g | INTRAVENOUS | Status: DC

## 2021-07-24 MED ORDER — VANCOMYCIN 50 MG/ML ORAL SOLUTION: 125.0000 mg | Freq: Four times a day (QID) | ORAL | Status: DC

## 2021-07-24 MED ORDER — FENTANYL CITRATE (PF) 100 MCG/2ML IJ SOLN
INTRAMUSCULAR | Status: AC | PRN
  Administered 2021-07-24 (×2): 12.5 ug via INTRAVENOUS

## 2021-07-24 MED ORDER — METOPROLOL TARTRATE 12.5 MG HALF TABLET
12.5000 mg | ORAL_TABLET | Freq: Two times a day (BID) | ORAL | Status: DC
  Administered 2021-07-24 – 2021-08-03 (×20): 12.5 mg via ORAL
  Filled 2021-07-24 (×21): qty 1

## 2021-07-24 MED ORDER — GADOBUTROL 1 MMOL/ML IV SOLN
7.0000 mL | Freq: Once | INTRAVENOUS | Status: AC | PRN
  Administered 2021-07-24: 7 mL via INTRAVENOUS

## 2021-07-24 MED ORDER — IOHEXOL 9 MG/ML PO SOLN
ORAL | Status: AC
  Filled 2021-07-24: qty 1000

## 2021-07-24 MED ORDER — SODIUM CHLORIDE 0.9 % IV SOLN: INTRAVENOUS | Status: AC

## 2021-07-24 MED ORDER — IOHEXOL 350 MG/ML SOLN
100.0000 mL | Freq: Once | INTRAVENOUS | Status: AC | PRN
  Administered 2021-07-24: 100 mL via INTRAVENOUS

## 2021-07-24 MED ORDER — SODIUM CHLORIDE 0.9 % IV SOLN
2.0000 g | INTRAVENOUS | Status: DC
  Administered 2021-07-24 – 2021-07-25 (×2): 2 g via INTRAVENOUS
  Filled 2021-07-24 (×2): qty 20

## 2021-07-24 MED ORDER — FENTANYL CITRATE (PF) 100 MCG/2ML IJ SOLN
INTRAMUSCULAR | Status: AC
  Filled 2021-07-24: qty 2

## 2021-07-24 MED ORDER — SODIUM CHLORIDE 0.9 % IV SOLN
8.0000 mg/kg | Freq: Every day | INTRAVENOUS | Status: DC
  Administered 2021-07-24 – 2021-07-25 (×2): 600 mg via INTRAVENOUS
  Filled 2021-07-24 (×3): qty 12

## 2021-07-24 MED ORDER — VANCOMYCIN HCL 125 MG PO CAPS
125.0000 mg | ORAL_CAPSULE | Freq: Every day | ORAL | Status: DC
  Administered 2021-07-24 – 2021-08-05 (×13): 125 mg via ORAL
  Filled 2021-07-24 (×14): qty 1

## 2021-07-24 MED ORDER — MIDAZOLAM HCL 2 MG/2ML IJ SOLN
INTRAMUSCULAR | Status: AC | PRN
  Administered 2021-07-24 (×2): .5 mg via INTRAVENOUS

## 2021-07-24 MED ORDER — MIDAZOLAM HCL 2 MG/2ML IJ SOLN
INTRAMUSCULAR | Status: AC
  Filled 2021-07-24: qty 2

## 2021-07-24 MED ORDER — BUPIVACAINE HCL (PF) 0.5 % IJ SOLN
INTRAMUSCULAR | Status: AC
  Administered 2021-07-24: 15 mL
  Filled 2021-07-24: qty 30

## 2021-07-24 NOTE — Consult Note (Addendum)
?  [facility] for Infectious Disease  ? ? ?Date of Admission:  07/23/2021    ? ?Reason for Consult: Discitis/OM ?    ?Referring Physician: Dr Bridgett Larsson ? ?Current antibiotics: ?None ? ? ?ASSESSMENT:   ? ?88 y.o. female admitted with: ? ?Discitis/OM of L5-S1: Patient presenting with 2 months of progressive back pain and radicular symptoms.  MRI w/o contrast on 3/30 noted findings of L5-S1 discitis/OM.  Also, the possibility of disc extrusion or epidural abscess/phlegmon was noted. ?Intra-abdominal fluid collections: CT in early March noted 2 fluid collections with rim enhancement raising the possibility of abscess. ?Recent surgery for rectal prolapse: Completed 05/26/21 with current symptoms developing shortly afterwards. ?New onset A-fib: In setting of known valvular heart disease. ?CKD 3: Stable issue. ?Sepsis physiology: Due to #1 and possibly #2. ?Hx of Recurrent C diff: Has history of recurrent infection previously followed by Dr Baxter Flattery.  Was being considered for fecal transplant prior to [place] pandemic.  In 2021, she received prolonged vancomycin taper and Zinplava January 2021. ? ?RECOMMENDATIONS:   ? ?Presentation concerning for the possibility of bacteremia and SBE (potentially related to intra-abdominal infection) seeding her degenerative changes in her spine leading to discitis/OM and possibly epidural abscess ?Recommend repeat CT abd/pelvis to assess for worsening IAA ?Recommend MRI with contrast to assess for epidural abscess ?Will repeat blood cultures again as daughter reports these were drawn in ED from the same location and labeling of cultures does not definitively dispute this ?Follow up Echo ?Appreciate NSGY input ?Agree with sampling of disc space and holding antibiotics given overall stability. ?Once cultures are obtained from disc space would start daptomycin and ceftriaxone empirically or sooner if she decompensates or imaging shows epidural abscess.  If imaging shows IAA would also add  Flagyl ?Recommend vancomycin '125mg'$  PO daily for C diff prophylaxis once antibiotics started ?Will follow ? ? ?Principal Problem: ?  Lumbar discitis ?Active Problems: ?  Hypothyroidism ?  HLD (hyperlipidemia) ?  Hypertension, essential ?  Valvular heart disease ?  Stage 3a chronic kidney disease (CKD) ([place]) ?  Right-sided low back pain with right-sided sciatica ?  New onset a-fib [facility]) ? ? ?MEDICATIONS:   ? ?Scheduled Meds: ? brimonidine  1 drop Both Eyes Daily  ? clonazePAM  0.5 mg Oral QHS  ? gabapentin  200 mg Oral BID  ? irbesartan  150 mg Oral Daily  ? latanoprost  1 drop Both Eyes QHS  ? levothyroxine  100 mcg Oral Daily  ? sertraline  100 mg Oral q AM  ? spironolactone  25 mg Oral Daily  ? ?Continuous Infusions: ? lactated ringers 50 mL/hr at 07/23/21 2314  ? ?PRN Meds:.acetaminophen **OR** acetaminophen, HYDROcodone-acetaminophen, ondansetron **OR** ondansetron (ZOFRAN) IV ? ?HPI:   ? ?Brooke Beasley is a 88 y.o. female with a past medical history as noted below who presented for admission yesterday with a 34-monthhistory of worsening low back pain and radiating symptoms down her right leg.  Her symptoms developed shortly after recent rectal prolapse surgery in January 2023 and over the last 2-3 weeks the back pain and radiating symptoms have worsened.  She also reports episodes of right leg weakness and giving out on her.  She has no involuntary loss of bladder or bowel control but has been using diapers due to increased urinary frequency and inability to get to bathroom in time.  She also has had symptoms of night sweats, chills, and low-grade temperatures of 99 degrees.  She has not had  any GI symptoms such as nausea, vomiting, diarrhea.  She was treated for possible upper respiratory infection with a couple rounds of antibiotics with Augmentin being the most recent approximately 3 weeks ago.  Given her ongoing complaints she had an MRI w/o contrast done yesterday as an outpatient.  This showed  L5-S1 discitis/osteomyelitis.  MRI also noted a moderate-sized cranially migrated disc extrusion and/or epidural abscess/phlegmon.  Upon admission, she was afebrile, hemodynamically stable.  Given the stability antibiotics were held and interventional radiology has been consulted for IR disc space aspiration or bone biopsy.  She was found to have leukocytosis of 16.1 and inflammatory markers are elevated with sedimentation rate 48, CRP 5.0.  Her metabolic panel shows renal insufficiency with a creatinine of 34.1, LFTs normal, albumin 2.9.  Blood cultures were obtained and she was admitted to the hospital.   ? ?Of note, patient also had a CT abdomen/pelvis on June 26, 2021 to evaluate flank pain after her recent surgery.  This showed 2 fluid collections.  1 fluid collection was posterior to the rectum measuring 6.5 x 2.8 cm with rim enhancement.  A second fluid collection was to the left of the rectum along the left vaginal fornix measuring 3.4 x 2.4 cm.  This also showed rim enhancement.  It was felt that these could be postoperative changes and no intervention was recommended at that time. ? ? ?Past Medical History:  ?Diagnosis Date  ? Anxiety   ? C. difficile diarrhea 07/11/2018  ? S/p multiple oral vanc treatments (course and tapers) and completed Zinplava monoclonal Ab treatment (05/10/2019) Fecal transplant on hold during [place] pandemic  ? CAP (community acquired pneumonia) 12/22/2017  ? Carotid stenosis 10/18/2014  ? R 1-39%, L 40-59%, rpt 1 yr (09/2014)   ? CKD (chronic kidney disease) stage 3, GFR 30-59 ml/min (HCC) 08/02/2014  ? Closed fracture of right orbit ([place]) 02/10/2021  ? Clostridioides difficile infection   ? hx 2020  ? COVID-19 virus infection 10/13/2020  ? Degenerative disc disease   ? LS  ? Depression   ? nervous breakdonw in 1964-out of work for a year  ? Diverticulosis   ? severe by colonoscopy  ? Family history of adverse reaction to anesthesia   ? n/v  ? GERD (gastroesophageal reflux disease)    ? Glaucoma   ? Heart murmur 08/2011  ? mitral regurge - on echo   ? History of hiatal hernia   ? History of shingles   ? HTN (hypertension)   ? Hyperlipidemia   ? Hypertension   ? Hypothyroidism   ? IBS 11/02/2006  ? Intestinal bacterial overgrowth   ? In small colon  ? Left shoulder pain 11/04/2014  ? Lower GI bleed 12/2020  ? thought diverticular complicated by ABLA with syncope and orbital fracture s/p hospitalization  ? Maxillary fracture ([place]) 04/08/2012  ? Osteoarthritis 2016  ? Jefm Bryant)  ? Osteoporosis   ? dexa 2011  ? Pseudogout 2016  ? shoulders ([place])  ? ? ?Social History  ? ?Tobacco Use  ? Smoking status: Never  ? Smokeless tobacco: Never  ?Vaping Use  ? Vaping Use: Never used  ?Substance Use Topics  ? Alcohol use: No  ? Drug use: No  ? ? ?Family History  ?Problem Relation Age of Onset  ? Diabetes Brother   ?     post-op  ? Coronary artery disease Brother   ? Diabetes Paternal Grandfather   ? Breast cancer Cousin   ? ? ?Allergies  ?  Allergen Reactions  ? Amlodipine Other (See Comments)  ?  Stomach pains--"feel like my insides are on fire"  ? Ace Inhibitors Cough  ? Carvedilol Other (See Comments)  ?  fatigue  ? Losartan Other (See Comments)  ?  Cr bumped  ? Other Other (See Comments)  ?  No seeds or corn because of IBS  ? Paroxetine Hcl Other (See Comments)  ?  Not effective - felt ill on this medicine  ? Risedronate Sodium   ?  REACTION: joint pain  ? Simvastatin   ?  REACTION: the full 20 mg pill causes leg pain- can tol 10 mg  ? Toprol Xl [Metoprolol] Diarrhea  ?  Diarrhea and weakness  ? Tramadol Other (See Comments)  ?  Felt like passing out  ? Alendronate Sodium Palpitations  ? Influenza Vaccines Rash and Other (See Comments)  ?  Led to mental breakdown in 64s ?  ? Silenor [Doxepin Hcl] Palpitations  ?  Hallucinations and racing heart  ? Sulfonamide Derivatives Rash  ? ? ?Review of Systems  ?Constitutional:  Positive for chills, fever (subjective) and malaise/fatigue.  ?Respiratory: Negative.     ?Cardiovascular: Negative.   ?Gastrointestinal: Negative.   ?Genitourinary:  Positive for flank pain.  ?Musculoskeletal:  Positive for back pain.  ?Skin: Negative.   ?All other systems reviewed and are negativ

## 2021-07-24 NOTE — Procedures (Signed)
INR. ?Status plan was L5-S1 disc aspiration under fluoroscopic guidance. ? ?Approximately 1 cc of blood-tinged aspirate obtained and sent for microbiologic analysis. ?Arlean Hopping MD ?

## 2021-07-24 NOTE — Consult Note (Signed)
? ?Chief Complaint: ?Patient was seen in consultation today for L5-S1 aspiration/Bone biopsy ?Chief Complaint  ?Patient presents with  ? Back Pain  ? at the request of Dr Armstead Peaks ? ?Supervising Physician: Luanne Bras ? ?Patient Status: [facility] - In-pt ? ?History of Present Illness: ?Brooke Beasley is a 88 y.o. female  ? ?Post rectal prolapse surgery Jan 2023 ?Pt with 2 months of worsening back pain- travels down Rt leg ? ?MRI yesterday:  ?IMPRESSION: ?Findings as described and highly suspicious for L5-S1 ?discitis/osteomyelitis. Associated edema within the paraspinal soft ?tissues at this level, as well as incompletely imaged presacral ?edema more inferiorly. Superimposed L5-S1 spondylosis. Signal ?abnormality posterior to the L5 vertebral body may reflect a ?moderate-sized cranially migrated disc extrusion and/or epidural ?abscess/phlegmon. Post-contrast MR imaging of the lumbar spine may ?be helpful for further characterization. Only mild spinal canal ?narrowing at L5-S1. Bilateral L5-S1 neural foraminal narrowing ?(moderate right, mild left). ? ?Request made for disc aspiration/Bone biopsy per Inf Disease MD ?Dr Estanislado Pandy has reviewed imaging and approves procedure ? ? ?Past Medical History:  ?Diagnosis Date  ? Anxiety   ? C. difficile diarrhea 07/11/2018  ? S/p multiple oral vanc treatments (course and tapers) and completed Zinplava monoclonal Ab treatment (05/10/2019) Fecal transplant on hold during [place] pandemic  ? CAP (community acquired pneumonia) 12/22/2017  ? Carotid stenosis 10/18/2014  ? R 1-39%, L 40-59%, rpt 1 yr (09/2014)   ? CKD (chronic kidney disease) stage 3, GFR 30-59 ml/min (HCC) 08/02/2014  ? Closed fracture of right orbit ([place]) 02/10/2021  ? Clostridioides difficile infection   ? hx 2020  ? COVID-19 virus infection 10/13/2020  ? Degenerative disc disease   ? LS  ? Depression   ? nervous breakdonw in 1964-out of work for a year  ? Diverticulosis   ? severe by colonoscopy  ?  Family history of adverse reaction to anesthesia   ? n/v  ? GERD (gastroesophageal reflux disease)   ? Glaucoma   ? Heart murmur 08/2011  ? mitral regurge - on echo   ? History of hiatal hernia   ? History of shingles   ? HTN (hypertension)   ? Hyperlipidemia   ? Hypertension   ? Hypothyroidism   ? IBS 11/02/2006  ? Intestinal bacterial overgrowth   ? In small colon  ? Left shoulder pain 11/04/2014  ? Lower GI bleed 12/2020  ? thought diverticular complicated by ABLA with syncope and orbital fracture s/p hospitalization  ? Maxillary fracture ([place]) 04/08/2012  ? Osteoarthritis 2016  ? Jefm Bryant)  ? Osteoporosis   ? dexa 2011  ? Pseudogout 2016  ? shoulders ([place])  ? ? ?Past Surgical History:  ?Procedure Laterality Date  ? barium enema  2012  ? severe diverticulosis, redundant colon  ? Bowel obstruction  1999  ? no surgery in hosp x 3 days  ? COLONOSCOPY  1. 1999  2. 11/04  ? 1. Not finished  2. Slight hemorrhage rectosigmoid area, severe sig diverticulosis  ? COLONOSCOPY WITH PROPOFOL N/A 09/10/2019  ? SSP with dysplasia, TA, rpt 3 yrs (Vanga, Tally Due, MD)  ? Dexa  1. 2641-5830   2. 9/04  3. 3/08   ? 1. OP  2. OP, borderline, spine -2.44T  3. decreased BMD-OP  ? DG KNEE 1-2 VIEWS BILAT    ? LS x-ray with degenerative disc and facet change  ? ESOPHAGOGASTRODUODENOSCOPY    ? Negative  ? FLEXIBLE SIGMOIDOSCOPY N/A 01/16/2021  ? Procedure: FLEXIBLE SIGMOIDOSCOPY;  Surgeon: Gatha Mayer, MD;  Location: [facility] ENDOSCOPY;  Service: Endoscopy;  Laterality: N/A;  or unsedated  ? Hemorrhoid procedure  07/2006  ? PROCTOSCOPY N/A 05/26/2021  ? Procedure: RIGID PROCTOSCOPY;  Surgeon: Michael Boston, MD;  Location: [facility] ORS;  Service: General;  Laterality: N/A;  ? RECTOPEXY N/A 05/26/2021  ? Procedure: RECTOPEXY;  Surgeon: Michael Boston, MD;  Location: [facility] ORS;  Service: General;  Laterality: N/A;  ? TONSILLECTOMY    ? TOTAL SHOULDER ARTHROPLASTY Left 11/27/2015  ? Corky Mull, MD  ? TOTAL SHOULDER REVISION Left 03/16/2016  ?  Procedure: TOTAL SHOULDER REVISION;  Surgeon: Corky Mull, MD;  Location: [facility] ORS;  Service: Orthopedics;  Laterality: Left;  ? US ECHOCARDIOGRAPHY  07/2011  ? Normal systolic fxn with EF 44-96%.  Focal basal septal hypertrophy.  Mild diastolic dysfunction.  Mild MR.  ? XI ROBOTIC ASSISTED LOWER ANTERIOR RESECTION N/A 05/26/2021  ? Procedure: ROBOTIC LOW ANTERIOR RECTOSIGMOID RESECTION; ASSESSMENT OF TISSUE PERFUSION WITH FIREFLY;  Surgeon: Michael Boston, MD;  Location: [facility] ORS;  Service: General;  Laterality: N/A;  ? ? ?Allergies: ?Amlodipine, Ace inhibitors, Carvedilol, Losartan, Other, Paroxetine hcl, Risedronate sodium, Simvastatin, Toprol xl [metoprolol], Tramadol, Alendronate sodium, Influenza vaccines, Silenor [doxepin hcl], and Sulfonamide derivatives ? ?Medications: ?Prior to Admission medications   ?Medication Sig Start Date End Date Taking? Authorizing Provider  ?acetaminophen (TYLENOL) 500 MG tablet Take 500-1,000 mg by mouth every 6 (six) hours as needed for moderate pain. Depends on pain   Yes [provider]  ?albuterol (PROVENTIL HFA;VENTOLIN HFA) 108 (90 Base) MCG/ACT inhaler Inhale 2 puffs into the lungs every 6 (six) hours as needed for wheezing or shortness of breath. 01/12/18  Yes [name], MD  ?[facility] P 0.1 % SOLN Place 1 drop into both eyes daily. 03/10/21  Yes [provider]  ?bimatoprost (LUMIGAN) 0.01 % SOLN Place 1 drop into both eyes at bedtime.   Yes [provider]  ?Bioflavonoid Products (VITAMIN C) CHEW Chew 1 tablet by mouth daily.   Yes [provider]  ?bisacodyl (DULCOLAX) 5 MG EC tablet Take 1 tablet (5 mg total) by mouth daily as needed for moderate constipation. 01/16/21 01/16/22 Yes Lavina Hamman, MD  ?cholecalciferol (VITAMIN D) 1000 units tablet Take 1,000 Units by mouth daily.   Yes [provider]  ?clonazePAM (KLONOPIN) 0.5 MG tablet TAKE 1 TABLET BY MOUTH AT BEDTIME ?Patient taking differently: Take 0.5 mg by mouth  at bedtime. 06/30/21  Yes [name], MD  ?colchicine 0.6 MG tablet Take 1 tablet (0.6 mg total) by mouth daily as needed (gout flare). 12/05/20  Yes [name], MD  ?Famotidine-Ca Carb-Mag Hydrox (PEPCID COMPLETE PO) Take 1 tablet by mouth daily as needed (indigestion).   Yes [provider]  ?furosemide (LASIX) 20 MG tablet Take 20 mg by mouth daily as needed for edema or fluid.   Yes [provider]  ?gabapentin (NEURONTIN) 100 MG capsule Take 2 capsules by mouth 2 times daily. ?Patient taking differently: Take 200 mg by mouth 3 (three) times daily. 05/29/21  Yes Michael Boston, MD  ?HYDROcodone-acetaminophen (NORCO/VICODIN) 5-325 MG tablet Take 1 tablet by mouth 3 (three) times daily as needed for moderate pain or severe pain. 07/13/21  Yes [name], MD  ?hydrocortisone 2.5 % cream Place 1 application rectally daily as needed (hemorrhoids). 10/23/18  Yes [provider]  ?Infant Care Products (DERMACLOUD) OINT Apply 1 application topically 2 (two) times daily as needed. 06/16/21  Yes Viviana Simpler  I, MD  ?irbesartan (AVAPRO) 150 MG tablet Take 1 tablet (150 mg total) by mouth daily. 03/31/21  Yes [name], MD  ?Iron-FA-B Cmp-C-Biot-Probiotic (FUSION PLUS) CAPS Take 1 capsule by mouth once daily 06/22/21  Yes Vanga, Tally Due, MD  ?levothyroxine (SYNTHROID) 100 MCG tablet Take 1 tablet by mouth once daily 04/08/21  Yes [name], MD  ?Misc Natural Products (OSTEO BI-FLEX TRIPLE STRENGTH PO) Take 1 tablet by mouth daily.   Yes [provider]  ?Multiple Vitamin (MULTIVITAMIN ADULT PO) Take by mouth daily.   Yes [provider]  ?ondansetron (ZOFRAN) 4 MG tablet Take 1 tablet by mouth every 8 hours as needed for nausea or refractory nausea / vomiting. 05/26/21  Yes Michael Boston, MD  ?polyethylene glycol (MIRALAX / GLYCOLAX) 17 g packet Take 17 g by mouth daily.   Yes [provider]  ?sertraline (ZOLOFT) 100 MG tablet Take 100  mg by mouth in the morning. 10/05/20  Yes [name], MD  ?Simethicone 180 MG CAPS Take 1 capsule by mouth daily as needed (indigestion).   Yes [provider]  ?spironolactone (ALDACTONE) 25 MG

## 2021-07-24 NOTE — Progress Notes (Signed)
?PROGRESS NOTE ? ? ?Brooke Beasley  AJG:[id number] DOB: 06/08/1930 DOA: 07/23/2021 ?PCP: [name], MD  ?Brief Narrative:  ? 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 Marius Ditch ?--prior colonoscopy 08/2019 sigmoid diverticuli polyps rectal prolapse large external hemorrhoids ?Rectal prolapse status post robotic low anterior resection with rectopexy proctoscopy Dr. Johney Maine 05/26/2021 ?  ?Seen in office 07/13/2021 with 2 months of intermittent discomfort and low back pain worsening over the 2 weeks prior to seeing Dr. Danise Mina  ?2/2 right sciatica-given steroid taper scheduled Tylenol as needed hydrocodone ?Daughter at the bedside tells me she was having some bruising postprocedure by Dr. Ronald Lobo bruising however was on the right side where his low anterior resection was on the left-she had also been walking more frequently ?Also recent treatment 2 rounds of antibiotics Augmentin ?Because of unrelenting pain outpatient MRI performed showing phlegmon versus abscess L5-S1 with T1 abnormality in addition ? ?Sent to hospital for admission and found to be in new onset A-fib on evaluation ?BUN/creatinine 23/0.9,  ?CRP 5, ESR 48 (looks like had elevated ESR however previously in 2015) ?White count 12 platelet 350 predominant neutrophilia ?Blood culture X2--3/30 performed ? ? ?Hospital-Problem based course ? ?Discitis osteomyelitis L5-S1 ?Discussed with neurosurgeon Dr. Pieter Partridge Dawley who reviewed images and feels this definitely could represent early osteo-appreciate ID guidance-patient will undergo disc aspiration/biopsy under IR and appreciate care coordination by team ?Hold antibiotics at this time-after biopsy vancomycin and ceftriaxone ?CT abdomen pelvis with  contrast stat to rule out further worsening of seromas noted on CT scan 06/26/2021 ?Await echocardiogram, MRI with contrast ?Judicious IV fluid LR 50 cc/H ?Norco 08/27/2023 3 times daily as needed moderate pain, mild pain Tylenol-hold IV pain meds if possible ?Keep n.p.o. until procedure performed ?Follow blood culture 3/30 ?Follow repeat cultures 3/31 given lack of clarity regarding how cultures were taken ?New onset A-fib-etiology probably secondary to infection ?CHA2DS2-VASc score=5 ?Metoprolol 12.5 twice daily given heart rates low 100s (only has intolerance not allergy) ?Hold anticoagulation at this time ?Obtain TSH with next lab draws as may be precipitated by this ?Discontinue irbesartan for now ?Check a.m. magnesium ?Prior recurrent C. Difficile ?Prolonged prior vancomycin taper plus Zinplava 04/2019--- needs vancomycin 125 p.o. daily prophylaxis once antibiotics started ?CKD 3B ?Hold irbesartan as above, Lasix 20 as needed held, continue Aldactone carefully ?Repeat labs a.m. ?Neuropathy NOS ?Continue gabapentin 200 twice daily carefully ?Anxiety ?Klonopin 0.5 at bedtime ? ? ? ?DVT prophylaxis: SCD ?Code Status: DNR confirmed at bedside ?Family Communication: Discussed with younger daughter at the bedside ?Disposition:  ?Status is: Observation ?The patient will require care spanning > 2 midnights and should be moved to inpatient because:  ? ?Sick and needs work-up ?  ?Consultants:  ?Infectious disease ?Neurosurgery ? ?Procedures: Multiple ? ?Antimicrobials: None as yet ? ? ?Subjective: ?Awake alert and severe pain in the right side in her hip she states ?Tells me this has been going on for about 2 months but got significantly worse in 2 weeks ?Some low-grade fevers at night according to daughter ?Relates multiple med allergies ? ?Objective: ?Vitals:  ? 07/23/21 1917 07/23/21 2305 07/24/21 3559 07/24/21 0743  ?BP: (!) 145/75 (!) 154/76  (!) 143/78  ?Pulse: 89 72  88  ?Resp: 15 15  19   ?Temp: 98.3 ?F (36.8 ?C) 98.2  ?F (36.8 ?  C) 98.2 ?F (36.8 ?C) 98.3 ?F (36.8 ?C)  ?TempSrc: Oral Oral Oral Oral  ?SpO2: 98% 97%  94%  ?Weight:      ?Height:      ? ? ?Intake/Output Summary (Last 24 hours) at 07/24/2021 0817 ?Last data filed at 07/24/2021 0746 ?Gross per 24 hour  ?Intake 216.96 ml  ?Output 1400 ml  ?Net -1183.04 ml  ? ?Filed Weights  ? 07/23/21 1326  ?Weight: 72.6 kg  ? ? ?Examination: ? ?EOMI NCAT no focal deficit frail white female ?S1-S2 irregularly irregular cannot appreciate murmur ?Abdomen is soft with well-healed lower segment scar she is slightly tender in the right lower quadrant ?Chest is clear ?Straight leg raise is antalgic up to about 20 degrees flexion on the right side but she can do straight leg raise fairly well up to about 60 degrees on the left ?Sensory is intact ?Dorsi plantarflexion are intact ?Psych-euthymic but seems to be in discomfort ? ?Data Reviewed: personally reviewed  ? ?CBC ?   ?Component Value Date/Time  ? WBC 16.1 (H) 07/24/2021 0449  ? RBC 4.21 07/24/2021 0449  ? HGB 12.3 07/24/2021 0449  ? HGB 13.7 04/09/2021 1417  ? HCT 37.1 07/24/2021 0449  ? HCT 42.0 04/09/2021 1417  ? PLT 350 07/24/2021 0449  ? PLT 259 04/09/2021 1417  ? MCV 88.1 07/24/2021 0449  ? MCV 88 04/09/2021 1417  ? MCH 29.2 07/24/2021 0449  ? MCHC 33.2 07/24/2021 0449  ? RDW 18.0 (H) 07/24/2021 0449  ? RDW 16.2 (H) 04/09/2021 1417  ? LYMPHSABS 1.6 07/24/2021 0449  ? LYMPHSABS 1.8 03/27/2021 0935  ? MONOABS 1.5 (H) 07/24/2021 0449  ? EOSABS 0.4 07/24/2021 0449  ? EOSABS 0.5 (H) 03/27/2021 0935  ? BASOSABS 0.0 07/24/2021 0449  ? BASOSABS 0.1 03/27/2021 0935  ? ? ?  Latest Ref Rng & Units 07/24/2021  ?  4:49 AM 07/23/2021  ?  3:55 PM 05/30/2021  ?  5:18 AM  ?CMP  ?Glucose 70 - 99 mg/dL 119   98     ?BUN 8 - 23 mg/dL 15   23     ?Creatinine 0.44 - 1.00 mg/dL 1.04   0.90   0.79    ?Sodium 135 - 145 mmol/L 132   133     ?Potassium 3.5 - 5.1 mmol/L 4.0   4.0   4.6    ?Chloride 98 - 111 mmol/L 97   95     ?CO2 22 - 32 mmol/L 26   29     ?Calcium  8.9 - 10.3 mg/dL 8.9   10.1     ?Total Protein 6.5 - 8.1 g/dL 6.5   7.0     ?Total Bilirubin 0.3 - 1.2 mg/dL 0.6   0.4     ?Alkaline Phos 38 - 126 U/L 92   89     ?AST 15 - 41 U/L 22   19     ?ALT 0 - 44 U/L 18   17     ? ? ? ?Radiology Studies: ?MR Lumbar Spine Wo Contrast ? ?Result Date: 07/23/2021 ?CLINICAL DATA:  Right-sided low back pain with right-sided sciatica, unspecified chronicity. Low back pain, symptoms persist with greater than 6 weeks treatment. Additional history provided by scanning technologist: Patient reports low back pain with right leg pain and numbness for 2 months. EXAM: MRI LUMBAR SPINE WITHOUT CONTRAST TECHNIQUE: Multiplanar, multisequence MR imaging of the lumbar spine was performed. No intravenous contrast was administered. COMPARISON:  Radiographs of the  lumbar spine 07/13/2021. CT abdomen/pelvis 06/26/2021. FINDINGS: Segmentation: 5 lumbar vertebrae. The caudal most well-formed intervertebral disc space is designated L5-S1. Alignment: Fairly prominent lumbar dextrocurvature. 4 mm L4-L5 grade 1 anterolisthesis. Vertebrae: No lumbar vertebral compression fracture. There is prominent T1 hypointense signal abnormality and edema signal within the L5 and S1 vertebrae. Additionally, there is T2 STIR hyperintense signal abnormality within the L5-S1 disc space. Additionally, there is paraspinal soft tissue edema at this level, as well as incompletely imaged presacral edema more inferiorly. These findings are highly suspicious for discitis/osteomyelitis. Minimal degenerative endplate edema at J7-P3. Conus medullaris and cauda equina: Conus extends to the L1-L2 level. No signal abnormality within the visualized distal spinal cord. Paraspinal and other soft tissues: Redemonstrated atrophic left kidney. Atrophy of the lumbar paraspinal musculature. Disc levels: Multilevel disc degeneration, greatest on the right at T11-T12 (moderate), on the left at L3-L4 (moderate/advanced), on the left at L4-L5  (moderate) and at L5-S1 (advanced). T11-T12: Imaged sagittally. Shallow broad-based central disc protrusion. Facet arthrosis and ligamentum flavum hypertrophy. No more than mild relative spinal canal nar

## 2021-07-24 NOTE — Progress Notes (Signed)
? ?  Echocardiogram ?2D Echocardiogram has been performed. ? ?Brooke Beasley ?07/24/2021, 9:24 AM ?

## 2021-07-24 NOTE — Consult Note (Signed)
Reason for Consult: L5/S1 discitis ?Referring Physician: Nita Sells, MD ? ?HPI: Brooke Beasley is an 88 y.o. female with a PmHx significant for HTN, valvular heart disease with MR/aortic stenosis, hypothyroidism, and HLD who presented to the ED due to complaints of a 37-monthhistory of progressively worsening low back pain that radiated down her RLE and into her big toe. She stated that her symptoms began after she underwent rectal prolapse surgery in January of 2023. Patient endorses chills with night sweats that saturate her pajamas. She denies weakenss, saddle anesthesia, and bowel/bladder abnormalities. An MRI of her lumbar spine was obtained as an outpatient and was concerning for L5-S1 discitis/osteomyelitis. She was then sent to the ED. NSX consult was requested due to finding on her MRI. ? ?Past Medical History:  ?Diagnosis Date  ? Anxiety   ? C. difficile diarrhea 07/11/2018  ? S/p multiple oral vanc treatments (course and tapers) and completed Zinplava monoclonal Ab treatment (05/10/2019) Fecal transplant on hold during [place]  ? CAP (community acquired pneumonia) 12/22/2017  ? Carotid stenosis 10/18/2014  ? R 1-39%, L 40-59%, rpt 1 yr (09/2014)   ? CKD (chronic kidney disease) stage 3, GFR 30-59 ml/min (HCC) 08/02/2014  ? Closed fracture of right orbit ([place] 02/10/2021  ? Clostridioides difficile infection   ? hx 2020  ? COVID-19 virus infection 10/13/2020  ? Degenerative disc disease   ? LS  ? Depression   ? nervous breakdonw in 1964-out of work for a year  ? Diverticulosis   ? severe by colonoscopy  ? Family history of adverse reaction to anesthesia   ? n/v  ? GERD (gastroesophageal reflux disease)   ? Glaucoma   ? Heart murmur 08/2011  ? mitral regurge - on echo   ? History of hiatal hernia   ? History of shingles   ? HTN (hypertension)   ? Hyperlipidemia   ? Hypertension   ? Hypothyroidism   ? IBS 11/02/2006  ? Intestinal bacterial overgrowth   ? In small colon  ? Left shoulder  pain 11/04/2014  ? Lower GI bleed 12/2020  ? thought diverticular complicated by ABLA with syncope and orbital fracture s/p hospitalization  ? [place] 04/08/2012  ? Osteoarthritis 2016  ? (Jefm Bryant  ? Osteoporosis   ? dexa 2011  ? Pseudogout 2016  ? shoulders ([place])  ? ? ?Past Surgical History:  ?Procedure Laterality Date  ? barium enema  2012  ? severe diverticulosis, redundant colon  ? Bowel obstruction  1999  ? no surgery in hosp x 3 days  ? COLONOSCOPY  1. 1999  2. 11/04  ? 1. Not finished  2. Slight hemorrhage rectosigmoid area, severe sig diverticulosis  ? COLONOSCOPY WITH PROPOFOL N/A 09/10/2019  ? SSP with dysplasia, TA, rpt 3 yrs ([place], RTally Due MD)  ? Dexa  1. [address]  2. 9/04  3. 3/08   ? 1. OP  2. OP, borderline, spine -2.44T  3. decreased BMD-OP  ? DG KNEE 1-2 VIEWS BILAT    ? LS x-ray with degenerative disc and facet change  ? ESOPHAGOGASTRODUODENOSCOPY    ? Negative  ? FLEXIBLE SIGMOIDOSCOPY N/A 01/16/2021  ? Procedure: FLEXIBLE SIGMOIDOSCOPY;  Surgeon: GGatha Mayer MD;  [facility];  Service: Endoscopy;  Laterality: N/A;  or unsedated  ? Hemorrhoid procedure  07/2006  ? PROCTOSCOPY N/A 05/26/2021  ? Procedure: RIGID PROCTOSCOPY;  Surgeon: GMichael Boston MD;  Location: [facility];  Service: General;  Laterality: N/A;  ?  RECTOPEXY N/A 05/26/2021  ? Procedure: RECTOPEXY;  Surgeon: Michael Boston, MD;  Location: [facility];  Service: General;  Laterality: N/A;  ? TONSILLECTOMY    ? TOTAL SHOULDER ARTHROPLASTY Left 11/27/2015  ? Corky Mull, MD  ? TOTAL SHOULDER REVISION Left 03/16/2016  ? Procedure: TOTAL SHOULDER REVISION;  Surgeon: Corky Mull, MD;  Location: [facility] ORS;  Service: Orthopedics;  Laterality: Left;  ? US ECHOCARDIOGRAPHY  07/2011  ? Normal systolic fxn with EF 97-35%.  Focal basal septal hypertrophy.  Mild diastolic dysfunction.  Mild MR.  ? XI ROBOTIC ASSISTED LOWER ANTERIOR RESECTION N/A 05/26/2021  ? Procedure: ROBOTIC LOW ANTERIOR RECTOSIGMOID RESECTION;  ASSESSMENT OF TISSUE PERFUSION WITH FIREFLY;  Surgeon: Michael Boston, MD;  Location: [facility];  Service: General;  Laterality: N/A;  ? ? ?Family History  ?Problem Relation Age of Onset  ? Diabetes Brother   ?     post-op  ? Coronary artery disease Brother   ? Diabetes Paternal Grandfather   ? Breast cancer Cousin   ? ? ?Social History:  reports that she has never smoked. She has never used smokeless tobacco. She reports that she does not drink alcohol and does not use drugs. ? ?Allergies:  ?Allergies  ?Allergen Reactions  ? Amlodipine Other (See Comments)  ?  Stomach pains--"feel like my insides are on fire"  ? Ace Inhibitors Cough  ? Carvedilol Other (See Comments)  ?  fatigue  ? Losartan Other (See Comments)  ?  Cr bumped  ? Other Other (See Comments)  ?  No seeds or corn because of IBS  ? Paroxetine Hcl Other (See Comments)  ?  Not effective - felt ill on this medicine  ? Risedronate Sodium   ?  REACTION: joint pain  ? Simvastatin   ?  REACTION: the full 20 mg pill causes leg pain- can tol 10 mg  ? Toprol Xl [Metoprolol] Diarrhea  ?  Diarrhea and weakness  ? Tramadol Other (See Comments)  ?  Felt like passing out  ? Alendronate Sodium Palpitations  ? Influenza Vaccines Rash and Other (See Comments)  ?  Led to mental breakdown in 67s ?  ? Silenor [Doxepin Hcl] Palpitations  ?  Hallucinations and racing heart  ? Sulfonamide Derivatives Rash  ? ? ?Medications: I have reviewed the patient's current medications. ? ?Results for orders placed or performed during the hospital encounter of 07/23/21 (from the past 48 hour(s))  ?MRSA Next Gen by PCR, Nasal     Status: None  ? Collection Time: 07/23/21  2:06 PM  ? Specimen: Nasal Mucosa; Nasal Swab  ?Result Value Ref Range  ? MRSA by PCR Next Gen NOT DETECTED NOT DETECTED  ?  Comment: (NOTE) ?The GeneXpert MRSA Assay (FDA approved for NASAL specimens only), ?is one component of a comprehensive MRSA colonization surveillance ?program. It is not intended to diagnose MRSA  infection nor to guide ?or monitor treatment for MRSA infections. ?Test performance is not FDA approved in patients less than 2 years ?old. ?Performed at [facility], [place] [address], [place], [place] ?[address] ?  ?Comprehensive metabolic panel     Status: Abnormal  ? Collection Time: 07/23/21  3:55 PM  ?Result Value Ref Range  ? Sodium 133 (L) 135 - 145 mmol/L  ? Potassium 4.0 3.5 - 5.1 mmol/L  ? Chloride 95 (L) 98 - 111 mmol/L  ? CO2 29 22 - 32 mmol/L  ? Glucose, Bld 98 70 - 99 mg/dL  ?  Comment: Glucose reference range applies only to samples taken after fasting for at least 8 hours.  ? BUN 23 8 - 23 mg/dL  ? Creatinine, Ser 0.90 0.44 - 1.00 mg/dL  ? Calcium 10.1 8.9 - 10.3 mg/dL  ? Total Protein 7.0 6.5 - 8.1 g/dL  ? Albumin 3.6 3.5 - 5.0 g/dL  ? AST 19 15 - 41 U/L  ? ALT 17 0 - 44 U/L  ? Alkaline Phosphatase 89 38 - 126 U/L  ? Total Bilirubin 0.4 0.3 - 1.2 mg/dL  ? GFR, Estimated >60 >60 mL/min  ?  Comment: (NOTE) ?Calculated using the CKD-EPI Creatinine Equation (2021) ?  ? Anion gap 9 5 - 15  ?  Comment: Performed at [facility], [address] [place], [place] [address]  ?CBC with Differential     Status: Abnormal  ? Collection Time: 07/23/21  3:55 PM  ?Result Value Ref Range  ? WBC 12.3 (H) 4.0 - 10.5 K/uL  ? RBC 4.18 3.87 - 5.11 MIL/uL  ? Hemoglobin 11.7 (L) 12.0 - 15.0 g/dL  ? HCT 37.2 36.0 - 46.0 %  ? MCV 89.0 80.0 - 100.0 fL  ? MCH 28.0 26.0 - 34.0 pg  ? MCHC 31.5 30.0 - 36.0 g/dL  ? RDW 18.1 (H) 11.5 - 15.5 %  ? Platelets 365 150 - 400 K/uL  ? nRBC 0.0 0.0 - 0.2 %  ? Neutrophils Relative % 65 %  ? Neutro Abs 8.1 (H) 1.7 - 7.7 K/uL  ? Lymphocytes Relative 21 %  ? Lymphs Abs 2.5 0.7 - 4.0 K/uL  ? Monocytes Relative 10 %  ? Monocytes Absolute 1.2 (H) 0.1 - 1.0 K/uL  ? Eosinophils Relative 3 %  ? Eosinophils Absolute 0.3 0.0 - 0.5 K/uL  ? Basophils Relative 0 %  ? Basophils Absolute 0.0 0.0 - 0.1 K/uL  ? Immature Granulocytes 1 %  ? Abs Immature Granulocytes 0.12 (H) 0.00 -  0.07 K/uL  ?  Comment: Performed at [facility], [address] [place], [place] [address]  ?Sedimentation rate     Status: Abnormal  ? Collection Time: 07/23/21  3:55 PM  ?Result Value

## 2021-07-25 ENCOUNTER — Encounter: Payer: Self-pay | Admitting: Family Medicine

## 2021-07-25 DIAGNOSIS — Z8619 Personal history of other infectious and parasitic diseases: Secondary | ICD-10-CM

## 2021-07-25 DIAGNOSIS — N1831 Chronic kidney disease, stage 3a: Secondary | ICD-10-CM | POA: Diagnosis not present

## 2021-07-25 DIAGNOSIS — M4646 Discitis, unspecified, lumbar region: Secondary | ICD-10-CM | POA: Diagnosis not present

## 2021-07-25 DIAGNOSIS — I38 Endocarditis, valve unspecified: Secondary | ICD-10-CM | POA: Diagnosis not present

## 2021-07-25 DIAGNOSIS — I4891 Unspecified atrial fibrillation: Secondary | ICD-10-CM | POA: Diagnosis not present

## 2021-07-25 LAB — COMPREHENSIVE METABOLIC PANEL
ALT: 17 U/L (ref 0–44)
AST: 23 U/L (ref 15–41)
Albumin: 2.9 g/dL — ABNORMAL LOW (ref 3.5–5.0)
Alkaline Phosphatase: 89 U/L (ref 38–126)
Anion gap: 9 (ref 5–15)
BUN: 13 mg/dL (ref 8–23)
CO2: 26 mmol/L (ref 22–32)
Calcium: 9.4 mg/dL (ref 8.9–10.3)
Chloride: 99 mmol/L (ref 98–111)
Creatinine, Ser: 1.05 mg/dL — ABNORMAL HIGH (ref 0.44–1.00)
GFR, Estimated: 51 mL/min — ABNORMAL LOW (ref >60)
Glucose, Bld: 121 mg/dL — ABNORMAL HIGH (ref 70–99)
Potassium: 5.3 mmol/L — ABNORMAL HIGH (ref 3.5–5.1)
Sodium: 134 mmol/L — ABNORMAL LOW (ref 135–145)
Total Bilirubin: 0.6 mg/dL (ref 0.3–1.2)
Total Protein: 6.6 g/dL (ref 6.5–8.1)

## 2021-07-25 LAB — MAGNESIUM: Magnesium: 2.1 mg/dL (ref 1.7–2.4)

## 2021-07-25 LAB — CBC WITH DIFFERENTIAL/PLATELET
Abs Immature Granulocytes: 0.11 10*3/uL — ABNORMAL HIGH (ref 0.00–0.07)
Basophils Absolute: 0 10*3/uL (ref 0.0–0.1)
Basophils Relative: 0 %
Eosinophils Absolute: 0.3 10*3/uL (ref 0.0–0.5)
Eosinophils Relative: 2 %
HCT: 40.1 % (ref 36.0–46.0)
Hemoglobin: 13.1 g/dL (ref 12.0–15.0)
Immature Granulocytes: 1 %
Lymphocytes Relative: 15 %
Lymphs Abs: 2.4 10*3/uL (ref 0.7–4.0)
MCH: 28.9 pg (ref 26.0–34.0)
MCHC: 32.7 g/dL (ref 30.0–36.0)
MCV: 88.3 fL (ref 80.0–100.0)
Monocytes Absolute: 1.6 10*3/uL — ABNORMAL HIGH (ref 0.1–1.0)
Monocytes Relative: 10 %
Neutro Abs: 11.2 10*3/uL — ABNORMAL HIGH (ref 1.7–7.7)
Neutrophils Relative %: 72 %
Platelets: 330 10*3/uL (ref 150–400)
RBC: 4.54 MIL/uL (ref 3.87–5.11)
RDW: 18 % — ABNORMAL HIGH (ref 11.5–15.5)
WBC: 15.6 10*3/uL — ABNORMAL HIGH (ref 4.0–10.5)
nRBC: 0 % (ref 0.0–0.2)

## 2021-07-25 LAB — TSH: TSH: 6.663 u[IU]/mL — ABNORMAL HIGH (ref 0.350–4.500)

## 2021-07-25 LAB — CK: Total CK: 24 U/L — ABNORMAL LOW (ref 38–234)

## 2021-07-25 MED ORDER — ENOXAPARIN SODIUM 80 MG/0.8ML IJ SOSY
70.0000 mg | PREFILLED_SYRINGE | Freq: Two times a day (BID) | INTRAMUSCULAR | Status: DC
  Administered 2021-07-25 – 2021-07-27 (×4): 70 mg via SUBCUTANEOUS
  Filled 2021-07-25 (×4): qty 0.8

## 2021-07-25 MED ORDER — HYDROCODONE-ACETAMINOPHEN 5-325 MG PO TABS
1.0000 | ORAL_TABLET | ORAL | Status: DC | PRN
  Administered 2021-07-25 – 2021-07-29 (×12): 1 via ORAL
  Filled 2021-07-25 (×11): qty 1

## 2021-07-25 MED ORDER — GERHARDT'S BUTT CREAM
TOPICAL_CREAM | CUTANEOUS | Status: DC | PRN
  Filled 2021-07-25: qty 1

## 2021-07-25 MED ORDER — MORPHINE SULFATE (PF) 2 MG/ML IV SOLN
1.0000 mg | Freq: Once | INTRAVENOUS | Status: AC
  Administered 2021-07-25: 1 mg via INTRAVENOUS
  Filled 2021-07-25: qty 1

## 2021-07-25 MED ORDER — MORPHINE SULFATE (PF) 2 MG/ML IV SOLN
1.0000 mg | INTRAVENOUS | Status: DC | PRN
  Administered 2021-07-27 – 2021-08-04 (×2): 1 mg via INTRAVENOUS
  Filled 2021-07-25 (×2): qty 1

## 2021-07-25 NOTE — Progress Notes (Signed)
ANTICOAGULATION CONSULT NOTE - Initial Consult ? ?Pharmacy Consult for Lovenox ?Indication: atrial fibrillation ? ?Allergies  ?Allergen Reactions  ? Amlodipine Other (See Comments)  ?  Stomach pains--"feel like my insides are on fire"  ? Ace Inhibitors Cough  ? Carvedilol Other (See Comments)  ?  fatigue  ? Losartan Other (See Comments)  ?  Cr bumped  ? Other Other (See Comments)  ?  No seeds or corn because of IBS  ? Paroxetine Hcl Other (See Comments)  ?  Not effective - felt ill on this medicine  ? Risedronate Sodium   ?  REACTION: joint pain  ? Simvastatin   ?  REACTION: the full 20 mg pill causes leg pain- can tol 10 mg  ? Toprol Xl [Metoprolol] Diarrhea  ?  Diarrhea and weakness  ? Alendronate Sodium Palpitations  ? Influenza Vaccines Rash and Other (See Comments)  ?  Led to mental breakdown in 64s ?  ? Silenor [Doxepin Hcl] Palpitations  ?  Hallucinations and racing heart  ? Sulfonamide Derivatives Rash  ? ? ?Patient Measurements: ?Height: '5\' 1"'$  (154.9 cm) ?Weight: 70.3 kg (154 lb 15.7 oz) ?IBW/kg (Calculated) : 47.8 ?Heparin Dosing Weight:  ? ?Vital Signs: ?Temp: 101.1 ?F (38.4 ?C) (04/01 1534) ?Temp Source: Oral (04/01 1534) ?BP: 154/90 (04/01 1534) ?Pulse Rate: 94 (04/01 1534) ? ?Labs: ?Recent Labs  ?  07/23/21 ?1555 07/24/21 ?6283 07/24/21 ?6629 07/25/21 ?4765  ?HGB 11.7* 12.3  --  13.1  ?HCT 37.2 37.1  --  40.1  ?PLT 365 350  --  330  ?LABPROT  --   --  13.8  --   ?INR  --   --  1.1  --   ?CREATININE 0.90 1.04*  --  1.05*  ?CKTOTAL  --   --   --  24*  ? ? ?Estimated Creatinine Clearance: 33.2 mL/min (A) (by C-G formula based on SCr of 1.05 mg/dL (H)). ? ? ?Medical History: ?Past Medical History:  ?Diagnosis Date  ? Anxiety   ? C. difficile diarrhea 07/11/2018  ? S/p multiple oral vanc treatments (course and tapers) and completed Zinplava monoclonal Ab treatment (05/10/2019) Fecal transplant on hold during [place] pandemic  ? CAP (community acquired pneumonia) 12/22/2017  ? Carotid stenosis 10/18/2014  ? R  1-39%, L 40-59%, rpt 1 yr (09/2014)   ? CKD (chronic kidney disease) stage 3, GFR 30-59 ml/min (HCC) 08/02/2014  ? Closed fracture of right orbit ([place]) 02/10/2021  ? Clostridioides difficile infection   ? hx 2020  ? COVID-19 virus infection 10/13/2020  ? Degenerative disc disease   ? LS  ? Depression   ? nervous breakdonw in 1964-out of work for a year  ? Diverticulosis   ? severe by colonoscopy  ? Family history of adverse reaction to anesthesia   ? n/v  ? GERD (gastroesophageal reflux disease)   ? Glaucoma   ? Heart murmur 08/2011  ? mitral regurge - on echo   ? History of hiatal hernia   ? History of shingles   ? HTN (hypertension)   ? Hyperlipidemia   ? Hypertension   ? Hypothyroidism   ? IBS 11/02/2006  ? Intestinal bacterial overgrowth   ? In small colon  ? Left shoulder pain 11/04/2014  ? Lower GI bleed 12/2020  ? thought diverticular complicated by ABLA with syncope and orbital fracture s/p hospitalization  ? Maxillary fracture ([place]) 04/08/2012  ? Osteoarthritis 2016  ? Jefm Bryant)  ? Osteoporosis   ? dexa 2011  ?  Pseudogout 2016  ? shoulders ([place])  ? ? ?Medications:  ?Medications Prior to Admission  ?Medication Sig Dispense Refill Last Dose  ? acetaminophen (TYLENOL) 500 MG tablet Take 500-1,000 mg by mouth every 6 (six) hours as needed for moderate pain. Depends on pain   Past Week  ? albuterol (PROVENTIL HFA;VENTOLIN HFA) 108 (90 Base) MCG/ACT inhaler Inhale 2 puffs into the lungs every 6 (six) hours as needed for wheezing or shortness of breath. 1 Inhaler 0 Past Month  ? ALPHAGAN P 0.1 % SOLN Place 1 drop into both eyes daily.   07/23/2021  ? bimatoprost (LUMIGAN) 0.01 % SOLN Place 1 drop into both eyes at bedtime.   07/22/2021  ? Bioflavonoid Products (VITAMIN C) CHEW Chew 1 tablet by mouth daily.   07/22/2021  ? bisacodyl (DULCOLAX) 5 MG EC tablet Take 1 tablet (5 mg total) by mouth daily as needed for moderate constipation. 30 tablet 1 Past Week  ? cholecalciferol (VITAMIN D) 1000 units tablet Take 1,000  Units by mouth daily.   Past Week  ? clonazePAM (KLONOPIN) 0.5 MG tablet TAKE 1 TABLET BY MOUTH AT BEDTIME (Patient taking differently: Take 0.5 mg by mouth at bedtime.) 30 tablet 0 07/22/2021  ? colchicine 0.6 MG tablet Take 1 tablet (0.6 mg total) by mouth daily as needed (gout flare). 90 tablet 0 Past Week  ? Famotidine-Ca Carb-Mag Hydrox (PEPCID COMPLETE PO) Take 1 tablet by mouth daily as needed (indigestion).   Past Week  ? furosemide (LASIX) 20 MG tablet Take 20 mg by mouth daily as needed for edema or fluid.   Past Week  ? gabapentin (NEURONTIN) 100 MG capsule Take 2 capsules by mouth 2 times daily. (Patient taking differently: Take 200 mg by mouth 3 (three) times daily.) 40 capsule 1 07/23/2021  ? HYDROcodone-acetaminophen (NORCO/VICODIN) 5-325 MG tablet Take 1 tablet by mouth 3 (three) times daily as needed for moderate pain or severe pain. 20 tablet 0 07/23/2021  ? hydrocortisone 2.5 % cream Place 1 application rectally daily as needed (hemorrhoids).   07/23/2021  ? Infant Care Products [facility]) OINT Apply 1 application topically 2 (two) times daily as needed. 430 g 0 Past Week  ? irbesartan (AVAPRO) 150 MG tablet Take 1 tablet (150 mg total) by mouth daily. 90 tablet 3 07/23/2021  ? Iron-FA-B Cmp-C-Biot-Probiotic (FUSION PLUS) CAPS Take 1 capsule by mouth once daily 30 capsule 2 07/22/2021  ? levothyroxine (SYNTHROID) 100 MCG tablet Take 1 tablet by mouth once daily 90 tablet 1 07/23/2021  ? Misc Natural Products (OSTEO BI-FLEX TRIPLE STRENGTH PO) Take 1 tablet by mouth daily.   07/23/2021  ? Multiple Vitamin (MULTIVITAMIN ADULT PO) Take by mouth daily.   07/22/2021  ? ondansetron (ZOFRAN) 4 MG tablet Take 1 tablet by mouth every 8 hours as needed for nausea or refractory nausea / vomiting. 8 tablet 5 Past Week  ? polyethylene glycol (MIRALAX / GLYCOLAX) 17 g packet Take 17 g by mouth daily.   Past Week  ? sertraline (ZOLOFT) 100 MG tablet Take 100 mg by mouth in the morning.   07/23/2021  ? Simethicone 180 MG  CAPS Take 1 capsule by mouth daily as needed (indigestion).   Past Week  ? spironolactone (ALDACTONE) 25 MG tablet Take 1 tablet (25 mg total) by mouth daily. 90 tablet 3 Past Week  ? traMADol (ULTRAM) 50 MG tablet Take 50 mg by mouth every 6 (six) hours as needed for moderate pain.   Past Month  ? VOLTAREN  1 % GEL APPLY ONE APPLICATION TOPICALLY 3 TIMES DAILY (Patient taking differently: Apply 2 g topically 3 (three) times daily as needed (arthritis pain).) 100 g 3 07/23/2021  ? Wheat Dextrin (BENEFIBER DRINK MIX PO) Take 15 mLs by mouth daily.   07/23/2021  ? benzonatate (TESSALON) 100 MG capsule Take 1 capsule (100 mg total) by mouth 3 (three) times daily as needed for cough. (Patient not taking: Reported on 07/23/2021) 30 capsule 0 Not Taking  ? denosumab (PROLIA) 60 MG/ML SOSY injection Inject 60 mg into the skin every 6 (six) months. (Patient not taking: Reported on 07/23/2021) 1 mL 1 Not Taking  ? omeprazole (PRILOSEC) 20 MG capsule Take 20 mg by mouth daily as needed (heartburn).     ? predniSONE (DELTASONE) 20 MG tablet Take two tablets daily for 4 days followed by one tablet daily for 4 days (Patient not taking: Reported on 07/23/2021) 12 tablet 0 Not Taking  ? ?Scheduled:  ? brimonidine  1 drop Both Eyes Daily  ? clonazePAM  0.5 mg Oral QHS  ? enoxaparin (LOVENOX) injection  70 mg Subcutaneous BID  ? gabapentin  200 mg Oral BID  ? latanoprost  1 drop Both Eyes QHS  ? levothyroxine  100 mcg Oral Daily  ? metoprolol tartrate  12.5 mg Oral BID  ? sertraline  100 mg Oral q AM  ? vancomycin  125 mg Oral QHS  ? ?Infusions:  ? cefTRIAXone (ROCEPHIN)  IV 2 g (07/24/21 2139)  ? DAPTOmycin (CUBICIN)  IV 600 mg (07/24/21 2049)  ? ? ?Assessment: ?Pt admitted for discitis/osteo. S/p biopsy. New onset AF. Lovenox ordered for anticoagulation.  ? ?CrCl>30 ml/min ?Hgb/plt wnl ? ?Goal of Therapy:  ?Anti-Xa level 0.6-1 units/ml 4hrs after LMWH dose given ?Monitor platelets by anticoagulation protocol: Yes ?  ?Plan:  ?Lovenox  '70mg'$  SQ BID ?F/u PO AC ? ?Onnie Boer, PharmD, BCIDP, AAHIVP, CPP ?Infectious Disease Pharmacist ?07/25/2021 6:08 PM ? ? ? ?

## 2021-07-25 NOTE — Progress Notes (Signed)
?  Transition of Care (TOC) Screening Note ? ? ?Patient Details  ?Name: Brooke Beasley ?Date of Birth: 14-Aug-1930 ? ? ?Transition of Care (TOC) CM/SW Contact:    ?Alfredia Ferguson, LCSW ?Phone Number: ?07/25/2021, 8:03 AM ? ? ? ?Transition of Care Department [facility]) has reviewed patient and noted no immediate TOC needs pending continued medical work-up. TOC team will continue to monitor patient advancement through interdisciplinary progression rounds to support any identified discharge supports as needed. If new patient transition needs arise, please place a TOC consult or reach out to [facility] team.  ?  ?

## 2021-07-25 NOTE — Progress Notes (Signed)
?   ? ?[facility] ? ?Date of Admission:  07/23/2021    ?       ?Reason for visit: Follow up on discitis/osteomyelitis ? ?Current antibiotics: ?Daptomycin 3/31-present ?Ceftriaxone 3/31-present ?Vancomycin p.o. 3/31-present ? ?ASSESSMENT:   ? ?88 y.o. female admitted with: ? ?Discitis/osteomyelitis of L5-S1: Initially noted on MRI without contrast 07/23/2021.  Follow-up MRI with contrast 07/24/2021 also noted 4 mm epidural phlegmon without drainable component.  Status post IR disc space aspiration yesterday which is no growth.  Additionally blood cultures are no growth to date as well.  Echocardiogram was also done which showed no significant changes from her previous. ?Intra-abdominal fluid collections: Repeat CT scan 07/24/2021 noted improving loculated fluid collections consistent with improving postoperative seromas or resolving abscesses.  Likely favor the former given improvement without antibiotics. ?Atrial fibrillation: In the setting of known valvular heart disease. ?CKD 3: Stable. ?History of recurrent C. difficile: Started on oral vancomycin prophylaxis yesterday. ?Recent surgery for rectal prolapse: Completed 05/26/2021.  Symptoms leading to this hospitalization developed shortly after that procedure. ? ?RECOMMENDATIONS:   ? ?Continue daptomycin and ceftriaxone pending cultures ?Continue oral vancomycin for C. difficile prophylaxis ?Follow-up blood cultures and disc space aspiration cultures ?Will follow ? ? ?Principal Problem: ?  Lumbar discitis ?Active Problems: ?  Hypothyroidism ?  HLD (hyperlipidemia) ?  Hypertension, essential ?  Valvular heart disease ?  Stage 3a chronic kidney disease (CKD) ([place]) ?  Right-sided low back pain with right-sided sciatica ?  New onset a-fib [facility]) ?  Disc disorder ? ? ? ?MEDICATIONS:   ? ?Scheduled Meds: ? brimonidine  1 drop Both Eyes Daily  ? clonazePAM  0.5 mg Oral QHS  ? gabapentin  200 mg Oral BID  ? latanoprost  1 drop Both Eyes QHS  ?  levothyroxine  100 mcg Oral Daily  ? metoprolol tartrate  12.5 mg Oral BID  ? sertraline  100 mg Oral q AM  ? spironolactone  25 mg Oral Daily  ? vancomycin  125 mg Oral QHS  ? ?Continuous Infusions: ? cefTRIAXone (ROCEPHIN)  IV 2 g (07/24/21 2139)  ? DAPTOmycin (CUBICIN)  IV 600 mg (07/24/21 2049)  ? ?PRN Meds:.acetaminophen **OR** acetaminophen, HYDROcodone-acetaminophen, ondansetron **OR** ondansetron (ZOFRAN) IV ? ?SUBJECTIVE:  ? ?24 hour events:  ?No acute events noted overnight ?Status post IR to space aspiration ?Afebrile, Tmax 98.8 ?Baseline CK24 ?WBC stable 15.6 ?Creatinine, LFTs stable ?Blood and disc base cultures no growth to date ?MRI with contrast consistent with discitis/osteomyelitis of the L5 and S1 vertebral bodies and intervening disc space ?No epidural abscess noted, however, she did have epidural phlegmon measuring 4 mm thick without discrete, drainable component ?CT abdomen and pelvis noted decreasing fluid collections consistent with resolving seromas or abscess ? ?No new complaints.  Denies fevers.  Tolerating antibiotics.  Reports ongoing back pain.  She was able to get up and go to the bathroom last night. ? ?Review of Systems  ?All other systems reviewed and are negative. ? ?  ?OBJECTIVE:  ? ?Blood pressure (!) 113/99, pulse 76, temperature 98.5 ?F (36.9 ?C), temperature source Oral, resp. rate 20, height '5\' 1"'$  (1.549 m), weight 70.3 kg, SpO2 91 %. ?Body mass index is 29.28 kg/m?. ? ?Physical Exam ?Constitutional:   ?   General: She is not in acute distress. ?   Appearance: Normal appearance.  ?HENT:  ?   Head: Normocephalic and atraumatic.  ?Eyes:  ?   Extraocular Movements: Extraocular movements intact.  ?  Conjunctiva/sclera: Conjunctivae normal.  ?Pulmonary:  ?   Effort: Pulmonary effort is normal. No respiratory distress.  ?Abdominal:  ?   General: There is no distension.  ?   Palpations: Abdomen is soft.  ?   Tenderness: There is no abdominal tenderness.  ?Musculoskeletal:  ?    Cervical back: Normal range of motion and neck supple.  ?   Right lower leg: No edema.  ?   Left lower leg: No edema.  ?Skin: ?   General: Skin is warm and dry.  ?   Findings: No rash.  ?Neurological:  ?   General: No focal deficit present.  ?   Mental Status: She is alert. Mental status is at baseline.  ?Psychiatric:     ?   Mood and Affect: Mood normal.     ?   Behavior: Behavior normal.  ? ? ? ?Lab Results: ?Lab Results  ?Component Value Date  ? WBC 15.6 (H) 07/25/2021  ? HGB 13.1 07/25/2021  ? HCT 40.1 07/25/2021  ? MCV 88.3 07/25/2021  ? PLT 330 07/25/2021  ?  ?Lab Results  ?Component Value Date  ? NA 134 (L) 07/25/2021  ? K 5.3 (H) 07/25/2021  ? CO2 26 07/25/2021  ? GLUCOSE 121 (H) 07/25/2021  ? BUN 13 07/25/2021  ? CREATININE 1.05 (H) 07/25/2021  ? CALCIUM 9.4 07/25/2021  ? GFRNONAA 51 (L) 07/25/2021  ? GFRAA 58 (L) 03/28/2019  ?  ?Lab Results  ?Component Value Date  ? ALT 17 07/25/2021  ? AST 23 07/25/2021  ? ALKPHOS 89 07/25/2021  ? BILITOT 0.6 07/25/2021  ? ? ?   ?Component Value Date/Time  ? CRP 5.0 (H) 07/23/2021 1555  ? ? ?   ?Component Value Date/Time  ? ESRSEDRATE 48 (H) 07/23/2021 1555  ? ?  ?I have reviewed the micro and lab results in Epic. ? ?Imaging: ?MR Lumbar Spine Wo Contrast ? ?Result Date: 07/23/2021 ?CLINICAL DATA:  Right-sided low back pain with right-sided sciatica, unspecified chronicity. Low back pain, symptoms persist with greater than 6 weeks treatment. Additional history provided by scanning technologist: Patient reports low back pain with right leg pain and numbness for 2 months. EXAM: MRI LUMBAR SPINE WITHOUT CONTRAST TECHNIQUE: Multiplanar, multisequence MR imaging of the lumbar spine was performed. No intravenous contrast was administered. COMPARISON:  Radiographs of the lumbar spine 07/13/2021. CT abdomen/pelvis 06/26/2021. FINDINGS: Segmentation: 5 lumbar vertebrae. The caudal most well-formed intervertebral disc space is designated L5-S1. Alignment: Fairly prominent lumbar  dextrocurvature. 4 mm L4-L5 grade 1 anterolisthesis. Vertebrae: No lumbar vertebral compression fracture. There is prominent T1 hypointense signal abnormality and edema signal within the L5 and S1 vertebrae. Additionally, there is T2 STIR hyperintense signal abnormality within the L5-S1 disc space. Additionally, there is paraspinal soft tissue edema at this level, as well as incompletely imaged presacral edema more inferiorly. These findings are highly suspicious for discitis/osteomyelitis. Minimal degenerative endplate edema at Y6-A6. Conus medullaris and cauda equina: Conus extends to the L1-L2 level. No signal abnormality within the visualized distal spinal cord. Paraspinal and other soft tissues: Redemonstrated atrophic left kidney. Atrophy of the lumbar paraspinal musculature. Disc levels: Multilevel disc degeneration, greatest on the right at T11-T12 (moderate), on the left at L3-L4 (moderate/advanced), on the left at L4-L5 (moderate) and at L5-S1 (advanced). T11-T12: Imaged sagittally. Shallow broad-based central disc protrusion. Facet arthrosis and ligamentum flavum hypertrophy. No more than mild relative spinal canal narrowing. No significant foraminal stenosis. T12-L1: Slight disc bulge. No significant spinal canal or foraminal  stenosis. L1-L2: Broad-based right center to right foraminal disc protrusion at site of posterior annular fissure. Resultant right subarticular stenosis with posterior displacement of the descending right L2 nerve root (series 12, image 10). No significant central canal stenosis. Mild right neural foraminal narrowing. The disc protrusion may contact the undersurface of the exiting right L1 nerve root within the right neural foramen. L2-L3: Disc bulge. Facet arthrosis (mild right, moderate left) with ligamentum flavum hypertrophy. Mild relative left subarticular narrowing (without nerve root impingement). Central canal patent. No significant foraminal stenosis. L3-L4: Disc bulge with  endplate spurring. Moderate facet arthrosis with ligamentum flavum hypertrophy. Mild to moderate left subarticular narrowing with slight crowding of the descending left L4 nerve root (series 12, image 23). No sig

## 2021-07-25 NOTE — Plan of Care (Signed)

## 2021-07-25 NOTE — Progress Notes (Unsigned)
{  Select_TRH_Note:[id number]} 

## 2021-07-25 NOTE — Evaluation (Signed)
Physical Therapy Evaluation ?Patient Details ?Name: Brooke Beasley ?MRN: [id number] ?DOB: January 03, 1931 ?Today's Date: 07/25/2021 ? ?History of Present Illness ? Pt is an 88 y.o. female who presented 07/23/21 with a 2 month hx of increasing low back pain with radiation down her R leg. Per chart, her symptoms began after she underwent rectal prolapse surgery in January of 2023. MRI was concerning for L5-S1 discitis/osteomyelitis. S/p L5-S1 disc aspiration 3/31. PMH: carotid stenosis, CKD, diverticulosis, glaucoma, heart murmur, HTN, HLD, hypothyroidism, pseudogout, osteoporosis ?  ?Clinical Impression ? Pt presents with condition above and deficits mentioned below, see PT Problem List. Prior to her rectal prolapse surgery in January 2023, she was IND intermittently using a SPC for mobility. As her pain has progressively worsened, she began to use a cane more regularly then began to use a RW and need assistance for mobility and ADLs. Pt lives with her daughter and son-in-law in a 1-level house with 2 short STE. Currently, pt is limited primarily by pain, but she is very motivated to participate despite the pain as she wants to return to her baseline. Pt required modA for supine <> sit transitions and minA to transfer to stand and take 1 step at EOB with UE support before the pain caused her to sit to rest. If her pain improves I suspect pt will progress quickly with mobility and may be able to go home with HHPT. However, at this time she may benefit from intensive therapy in the AIR setting to maximize her return to baseline as she has had such a functional decline. Will continue to follow acutely. ?   ? ?Recommendations for follow up therapy are one component of a multi-disciplinary discharge planning process, led by the attending physician.  Recommendations may be updated based on patient status, additional functional criteria and insurance authorization. ? ?Follow Up Recommendations Acute inpatient rehab  (3hours/day) (pending progress) ? ?  ?Assistance Recommended at Discharge Intermittent Supervision/Assistance  ?Patient can return home with the following ? A lot of help with walking and/or transfers;A lot of help with bathing/dressing/bathroom;Assistance with cooking/housework;Assist for transportation;Help with stairs or ramp for entrance ? ?  ?Equipment Recommendations None recommended by PT  ?Recommendations for Other Services ? Rehab consult  ?  ?Functional Status Assessment Patient has had a recent decline in their functional status and demonstrates the ability to make significant improvements in function in a reasonable and predictable amount of time.  ? ?  ?Precautions / Restrictions Precautions ?Precautions: Fall;Back ?Precaution Comments: back precautions for comfort ?Restrictions ?Weight Bearing Restrictions: No  ? ?  ? ?Mobility ? Bed Mobility ?Overal bed mobility: Needs Assistance ?Bed Mobility: Rolling, Sidelying to Sit, Sit to Supine ?Rolling: Min guard ?Sidelying to sit: Mod assist, HOB elevated ?  ?Sit to supine: Mod assist ?  ?General bed mobility comments: Cues to log roll for comfort, min guard using bed rail. ModA to manage trunk to come to sit. ModA to manage legs and trunk with return to supine. ?  ? ?Transfers ?Overall transfer level: Needs assistance ?Equipment used: 1 person hand held assist ?Transfers: Sit to/from Stand ?Sit to Stand: Min assist ?  ?  ?  ?  ?  ?General transfer comment: MinA to steady with transfer to stand from EOB, posterior sway noted. ?  ? ?Ambulation/Gait ?Ambulation/Gait assistance: Min assist ?Gait Distance (Feet): 1 Feet ?Assistive device: 1 person hand held assist ?Gait Pattern/deviations: Decreased stride length ?Gait velocity: reduced ?Gait velocity interpretation: <1.31 ft/sec, indicative of household ambulator ?  ?  General Gait Details: Pt taking 1 lateral step at EOB with UE support on PT anterior to her before needing to return to sit > supine due to pain.  Trunk sway noted, primarily posteriorly. ? ?Stairs ?  ?  ?  ?  ?  ? ?Wheelchair Mobility ?  ? ?Modified Rankin (Stroke Patients Only) ?  ? ?  ? ?Balance Overall balance assessment: Needs assistance ?Sitting-balance support: Bilateral upper extremity supported, Feet supported ?Sitting balance-Leahy Scale: Poor ?Sitting balance - Comments: Leans posteriorly and uses UEs to relieve the pain, minguard-minA to sit statically EOB. ?Postural control: Posterior lean ?Standing balance support: Bilateral upper extremity supported, During functional activity ?Standing balance-Leahy Scale: Poor ?Standing balance comment: Reliant on UE support and physical assistance. ?  ?  ?  ?  ?  ?  ?  ?  ?  ?  ?  ?   ? ? ? ?Pertinent Vitals/Pain Pain Assessment ?Pain Assessment: Faces ?Faces Pain Scale: Hurts whole lot ?Pain Location: back, R leg ?Pain Descriptors / Indicators: Discomfort, Grimacing, Guarding, Tingling ?Pain Intervention(s): Limited activity within patient's tolerance, Monitored during session, Premedicated before session, Repositioned  ? ? ?Home Living Family/patient expects to be discharged to:: Private residence ?Living Arrangements: Children ?Available Help at Discharge: Family;Available 24 hours/day;Personal care attendant (they hired a Actuary) ?Type of Home: House ?Home Access: Stairs to enter ?Entrance Stairs-Rails: Can reach both ?Entrance Stairs-Number of Steps: 2 (small steps) ?  ?Home Layout: One level ?Home Equipment: Shower seat;Grab bars - tub/shower;Transport Oncologist (2 wheels);Rollator (4 wheels);Cane - quad;Other (comment);BSC/3in1;Grab bars - toilet (hurrycane; lift chair; adjustable bed) ?   ?  ?Prior Function Prior Level of Function : Independent/Modified Independent;Driving ?  ?  ?  ?  ?  ?  ?Mobility Comments: Started to use the hurrycane when her pain increased and then the RW as the pain progressed. As the pain progressed she stood and walked less and needed some assistance. Prior to her  recent surgery she did not use anything to ambulate except intermittently a walking stick when outside ?ADLs Comments: Prior to her recent surgery she was IND and driving. Since her pain has been progressively worsening she began to need assistance for all ADLs ?  ? ? ?Hand Dominance  ? Dominant Hand: Left ? ?  ?Extremity/Trunk Assessment  ? Upper Extremity Assessment ?Upper Extremity Assessment: Defer to OT evaluation ?  ? ?Lower Extremity Assessment ?Lower Extremity Assessment: RLE deficits/detail ?RLE Deficits / Details: Very slight weakness grossly compared to L; numbness/tingling noted in R foot ?RLE Sensation: decreased light touch (at foot) ?  ? ?Cervical / Trunk Assessment ?Cervical / Trunk Assessment: Other exceptions;Kyphotic ?Cervical / Trunk Exceptions: L5-S2 discitis/oestomyelitis  ?Communication  ? Communication: HOH  ?Cognition Arousal/Alertness: Awake/alert ?Behavior During Therapy: [facility] for tasks assessed/performed ?Overall Cognitive Status: Within Functional Limits for tasks assessed ?  ?  ?  ?  ?  ?  ?  ?  ?  ?  ?  ?  ?  ?  ?  ?  ?General Comments: Distracted by pain often, but otherwise [facility] for tasks assessed. ?  ?  ? ?  ?General Comments General comments (skin integrity, edema, etc.): lightheaded but unable to get BP measurement as pt was straining or it would not read ? ?  ?Exercises    ? ?Assessment/Plan  ?  ?PT Assessment Patient needs continued PT services  ?PT Problem List Decreased strength;Decreased activity tolerance;Decreased balance;Decreased mobility;Impaired sensation;Pain ? ?   ?  ?PT  Treatment Interventions DME instruction;Gait training;Stair training;Functional mobility training;Therapeutic activities;Therapeutic exercise;Neuromuscular re-education;Balance training;Patient/family education   ? ?PT Goals (Current goals can be found in the Care Plan section)  ?Acute Rehab PT Goals ?Patient Stated Goal: to get back to her baseline ?PT Goal Formulation: With patient/family ?Time For  Goal Achievement: 08/08/21 ?Potential to Achieve Goals: Good ? ?  ?Frequency Min 3X/week ?  ? ? ?Co-evaluation   ?  ?  ?  ?  ? ? ?  ?AM-PAC PT "6 Clicks" Mobility  ?Outcome Measure Help needed turning from your ba

## 2021-07-25 NOTE — Progress Notes (Signed)
?PROGRESS NOTE ? ? ?Brooke Beasley  GHW:[id number] DOB: 1931-02-05 DOA: 07/23/2021 ?PCP: [name], MD  ?Brief Narrative:  ? 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 Marius Ditch ?--prior colonoscopy 08/2019 sigmoid diverticuli polyps rectal prolapse large external hemorrhoids ?Rectal prolapse status post robotic low anterior resection with rectopexy proctoscopy Dr. Johney Maine 05/26/2021 ?  ?Seen in office 07/13/2021 with 2 months of intermittent discomfort and low back pain worsening over the 2 weeks prior to seeing Dr. Danise Mina  ?2/2 right sciatica-given steroid taper scheduled Tylenol as needed hydrocodone ?Daughter at the bedside tells me she was having some bruising postprocedure by Dr. Ronald Lobo bruising however was on the right side where his low anterior resection was on the left-she had also been walking more frequently ?Also recent treatment 2 rounds of antibiotics Augmentin ?Because of unrelenting pain outpatient MRI performed showing phlegmon versus abscess L5-S1 with T1 abnormality in addition ? ?Sent to hospital for admission and found to be in new onset A-fib on evaluation ?BUN/creatinine 23/0.9,  ?CRP 5, ESR 48 (looks like had elevated ESR however previously in 2015) ?White count 12 platelet 350 predominant neutrophilia ?Blood culture X2--3/30 performed ? ?3/31-admit--MRI=Phlegmon 62m ,Disc aspiration=Enterococcus, CT abd/pelv--decreasing size seroma ?4/1-start Lovenox--adjust pain meds ? ?Hospital-Problem based course ? ?Discitis osteomyelitis L5-S1, 4 mm epidural phlegmon ?Status post IR guided disc aspiration 3/31 ?Mild fever today 15:00 ?neurosurgery Dr. DReatha Armour IR ?Dapto/ceftriaxone per ID Dr. WJuleen China NSL iv ?CT 06/26/2021-descreased  seroma size ?MRI confirms Discitis, ECHO no veg ?Norco 08/27/2023 q4 prn, mild pain Tylenol--IV pain meds if really prn ?NGTD blood culture 3/30---NGTD repeat cultures 07/24/2021  ?New onset A-fib-etiology probably secondary to infection ?CHA2DS2-VASc score=5 ?Metoprolol 12.5 twice daily given heart rates low 100s (only has intolerance not allergy) ?Start Lovenox 07/25/21--likely--> DOAC in am ?Check a.m. magnesium ?Subclinical Hypothyroid? ?Get t4 to diff  ?Do not adjust thyroxine for now ?Prior recurrent C. Difficile ?Prolonged prior vancomycin taper plus Zinplava 04/2019--- needs vancomycin 125 p.o. daily prophylaxis once antibiotics started ?CKD 3B ?Mild hyperkalemia ?Hold irbesartan , Lasix 20 Aldactone ?Repeat labs a.m. ?Neuropathy NOS ?Continue gabapentin 200 twice daily carefully ?Anxiety ?Klonopin 0.5 at bedtime ? ?DVT prophylaxis: SCD ?Code Status: DNR confirmed at bedside ?Family Communication:  ?Discussed with younger daughter at the bedside in detail ?Explained likely will need several days to start feeling better and increasing ability to mobilize ?Disposition:  ?Status is: Observation ?The patient will require care spanning > 2 midnights and should be moved to inpatient because:  ? ?Sick and needs work-up--likely CIR? ? ?  ?Consultants:  ?Infectious disease ?Neurosurgery ? ?Procedures: Multiple ? ?Antimicrobials: None as yet ? ?Subjective: ? ?Pain 6/10 ?Still weak ?Needed some IV opiates this am ?Adjusted pain meds--worked with PT today ? ?Objective: ?Vitals:  ? 07/25/21 0500 07/25/21 0805 07/25/21 1106 07/25/21 1534  ?BP:  (!) 113/99 125/62 (!) 154/90  ?Pulse:  76 88 94  ?Resp:  _0 ?Temp:  98.5 ?F (36.9 ?C) 98.9 ?F (37.2 ?C) (!) 101.1 ?F (38.4 ?C)  ?TempSrc:  Oral Oral Oral  ?SpO2:  91% 92% 96%  ?Weight: 70.3 kg     ?Height:      ? ? ?Intake/Output Summary (Last 24 hours) at 07/25/2021 1711 ?Last data filed at 07/25/2021 1537 ?Gross per 24 hour  ?Intake 162 ml  ?  Output 2050 ml  ?Net -1888 ml  ? ? ?Filed  Weights  ? 07/23/21 1326 07/25/21 0500  ?Weight: 72.6 kg 70.3 kg  ? ? ?Examination: ? ?EOMI NCAT no focal deficit frail white female, mod dentition ?S1-S2 irregularly irregular cannot appreciate murmur ?Abdomen is soft slightly tender in the right lower quadrant ?Chest is clear ?Straight leg raise is antalgic ?Dorsiplantar flexion are intact ?Psych-euthymic but seems to be in discomfort ? ?Data Reviewed: personally reviewed  ? ?CBC ?   ?Component Value Date/Time  ? WBC 15.6 (H) 07/25/2021 0544  ? RBC 4.54 07/25/2021 0544  ? HGB 13.1 07/25/2021 0544  ? HGB 13.7 04/09/2021 1417  ? HCT 40.1 07/25/2021 0544  ? HCT 42.0 04/09/2021 1417  ? PLT 330 07/25/2021 0544  ? PLT 259 04/09/2021 1417  ? MCV 88.3 07/25/2021 0544  ? MCV 88 04/09/2021 1417  ? MCH 28.9 07/25/2021 0544  ? MCHC 32.7 07/25/2021 0544  ? RDW 18.0 (H) 07/25/2021 0544  ? RDW 16.2 (H) 04/09/2021 1417  ? LYMPHSABS 2.4 07/25/2021 0544  ? LYMPHSABS 1.8 03/27/2021 0935  ? MONOABS 1.6 (H) 07/25/2021 0544  ? EOSABS 0.3 07/25/2021 0544  ? EOSABS 0.5 (H) 03/27/2021 0935  ? BASOSABS 0.0 07/25/2021 0544  ? BASOSABS 0.1 03/27/2021 0935  ? ? ?  Latest Ref Rng & Units 07/25/2021  ?  5:44 AM 07/24/2021  ?  4:49 AM 07/23/2021  ?  3:55 PM  ?CMP  ?Glucose 70 - 99 mg/dL 121   119   98    ?BUN 8 - 23 mg/dL _0 ?Creatinine 0.44 - 1.00 mg/dL 1.05   1.04   0.90    ?Sodium 135 - 145 mmol/L 134   132   133    ?Potassium 3.5 - 5.1 mmol/L 5.3   4.0   4.0    ?Chloride 98 - 111 mmol/L 99   97   95    ?CO2 22 - 32 mmol/L _1 ?Calcium 8.9 - 10.3 mg/dL 9.4   8.9   10.1    ?Total Protein 6.5 - 8.1 g/dL 6.6   6.5   7.0    ?Total Bilirubin 0.3 - 1.2 mg/dL 0.6   0.6   0.4    ?Alkaline Phos 38 - 126 U/L 89   92   89    ?AST 15 - 41 U/L _2 ?ALT 0 - 44 U/L _3 ? ? ? ?Radiology Studies: ?MR LUMBAR SPINE W CONTRAST ? ?Result Date: 07/24/2021 ?CLINICAL DATA:  Myelopathy, acute, lumbar spine Pt with MRI w/o contrast 3/30 showing discitis/OM. ? of epidural  abscess and would like to further evaluate EXAM: MRI LUMBAR SPINE WITH CONTRAST TECHNIQUE: Multiplanar and multiecho pulse sequences of the lumbar spine were obtained with intravenous contrast. CONTRAST:  36m GADAVIST GADOBUTROL 1 MMOL/ML IV SOLN COMPARISON:  MRI lumbar spine march 30, 23. FINDINGS: Postcontrast imaging was obtained to further assess findings on MRI lumbar spine from yesterday. This demonstrates extensive enhancement about the L5 and S1 vertebral bodies, compatible with osteomyelitis. There is also enhancement of the L5-S1 disc space, compatible with discitis. Enhancement and thickening of the ventral epidural space (up to 4 mm thick) at these levels, compatible with epidural phlegmon without discrete, drainable component. Also, enhancement of the surrounding paraspinal soft tissues,  compatible with paraspinal extension of infection. Please see MRI from yesterday for additional findings, including level by level degenerative change. IMPRESSION: Postcontrast imaging was obtained to further assess findings on MRI lumbar spine from yesterday. Extensive enhancement of the L5 and S1 vertebral bodies and intervening disc space, compatible with discitis/osteomyelitis. Enhancement of the ventral epidural space at these levels, compatible with ventral epidural phlegmon (measuring 4 mm thick) without discrete, drainable component. And anterior paraspinal extension of infection. Electronically Signed   By: Margaretha Sheffield M.D.   On: 07/24/2021 12:44  ? ?CT ABDOMEN PELVIS W CONTRAST ? ?Addendum Date: 07/24/2021   ?ADDENDUM REPORT: 07/24/2021 18:54 ADDENDUM: Imaging findings were relayed to Dr. Verlon Au by telephone call at 6:48 p.m. on 07/24/2021. Current study was compared with previous MR lumbar spine examinations done on 07/23/2021 and 07/24/2021 which confirmed diskitis and osteomyelitis at L5-S1 level. Electronically Signed   By: Elmer Picker M.D.   On: 07/24/2021 18:54  ? ?Result Date:  07/24/2021 ?CLINICAL DATA:  Abdominal pain EXAM: CT ABDOMEN AND PELVIS WITH CONTRAST TECHNIQUE: Multidetector CT imaging of the abdomen and pelvis was performed using the standard protocol following bolus administration

## 2021-07-26 DIAGNOSIS — I38 Endocarditis, valve unspecified: Secondary | ICD-10-CM | POA: Diagnosis not present

## 2021-07-26 DIAGNOSIS — N1831 Chronic kidney disease, stage 3a: Secondary | ICD-10-CM | POA: Diagnosis not present

## 2021-07-26 DIAGNOSIS — I4891 Unspecified atrial fibrillation: Secondary | ICD-10-CM | POA: Diagnosis not present

## 2021-07-26 DIAGNOSIS — B999 Unspecified infectious disease: Secondary | ICD-10-CM

## 2021-07-26 DIAGNOSIS — M4646 Discitis, unspecified, lumbar region: Secondary | ICD-10-CM | POA: Diagnosis not present

## 2021-07-26 LAB — RENAL FUNCTION PANEL
Albumin: 2.5 g/dL — ABNORMAL LOW (ref 3.5–5.0)
Anion gap: 9 (ref 5–15)
BUN: 12 mg/dL (ref 8–23)
CO2: 26 mmol/L (ref 22–32)
Calcium: 8.5 mg/dL — ABNORMAL LOW (ref 8.9–10.3)
Chloride: 96 mmol/L — ABNORMAL LOW (ref 98–111)
Creatinine, Ser: 0.94 mg/dL (ref 0.44–1.00)
GFR, Estimated: 58 mL/min — ABNORMAL LOW (ref >60)
Glucose, Bld: 123 mg/dL — ABNORMAL HIGH (ref 70–99)
Phosphorus: 3.3 mg/dL (ref 2.5–4.6)
Potassium: 4.2 mmol/L (ref 3.5–5.1)
Sodium: 131 mmol/L — ABNORMAL LOW (ref 135–145)

## 2021-07-26 LAB — CBC WITH DIFFERENTIAL/PLATELET
Abs Immature Granulocytes: 0.1 10*3/uL — ABNORMAL HIGH (ref 0.00–0.07)
Basophils Absolute: 0 10*3/uL (ref 0.0–0.1)
Basophils Relative: 0 %
Eosinophils Absolute: 0.3 10*3/uL (ref 0.0–0.5)
Eosinophils Relative: 2 %
HCT: 35.9 % — ABNORMAL LOW (ref 36.0–46.0)
Hemoglobin: 11.6 g/dL — ABNORMAL LOW (ref 12.0–15.0)
Immature Granulocytes: 1 %
Lymphocytes Relative: 15 %
Lymphs Abs: 2.3 10*3/uL (ref 0.7–4.0)
MCH: 28.9 pg (ref 26.0–34.0)
MCHC: 32.3 g/dL (ref 30.0–36.0)
MCV: 89.3 fL (ref 80.0–100.0)
Monocytes Absolute: 1.8 10*3/uL — ABNORMAL HIGH (ref 0.1–1.0)
Monocytes Relative: 12 %
Neutro Abs: 10.6 10*3/uL — ABNORMAL HIGH (ref 1.7–7.7)
Neutrophils Relative %: 70 %
Platelets: 288 10*3/uL (ref 150–400)
RBC: 4.02 MIL/uL (ref 3.87–5.11)
RDW: 17.7 % — ABNORMAL HIGH (ref 11.5–15.5)
WBC: 15.1 10*3/uL — ABNORMAL HIGH (ref 4.0–10.5)
nRBC: 0 % (ref 0.0–0.2)

## 2021-07-26 LAB — GLUCOSE, CAPILLARY: Glucose-Capillary: 130 mg/dL — ABNORMAL HIGH (ref 70–99)

## 2021-07-26 LAB — MAGNESIUM: Magnesium: 1.8 mg/dL (ref 1.7–2.4)

## 2021-07-26 LAB — T4, FREE: Free T4: 0.77 ng/dL (ref 0.61–1.12)

## 2021-07-26 MED ORDER — SODIUM CHLORIDE 0.9 % IV SOLN
2.0000 g | Freq: Two times a day (BID) | INTRAVENOUS | Status: DC
Start: 2021-07-26 — End: 2021-07-27
  Administered 2021-07-26 – 2021-07-27 (×2): 2 g via INTRAVENOUS
  Filled 2021-07-26 (×2): qty 20

## 2021-07-26 MED ORDER — HYDROXYZINE HCL 10 MG PO TABS
10.0000 mg | ORAL_TABLET | Freq: Three times a day (TID) | ORAL | Status: DC | PRN
  Administered 2021-07-26: 10 mg via ORAL
  Filled 2021-07-26 (×2): qty 1

## 2021-07-26 MED ORDER — SODIUM CHLORIDE 0.9 % IV SOLN: INTRAVENOUS | Status: DC

## 2021-07-26 MED ORDER — SODIUM CHLORIDE 0.9 % IV SOLN
3.0000 g | Freq: Four times a day (QID) | INTRAVENOUS | Status: DC
  Administered 2021-07-26 – 2021-08-05 (×39): 3 g via INTRAVENOUS
  Filled 2021-07-26 (×41): qty 8

## 2021-07-26 NOTE — Progress Notes (Signed)
?PROGRESS NOTE ? ? ?Brooke Beasley  ZOX:[id number] DOB: 10/27/1930 DOA: 07/23/2021 ?PCP: [name], MD  ?Brief Narrative:  ? 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 Marius Ditch ?--prior colonoscopy 08/2019 sigmoid diverticuli polyps rectal prolapse large external hemorrhoids ?Rectal prolapse status post robotic low anterior resection with rectopexy proctoscopy Dr. Johney Maine 05/26/2021 ?  ?Seen in office 07/13/2021 with 2 months of intermittent discomfort and low back pain worsening over the 2 weeks prior to seeing Dr. Danise Mina  ?2/2 right sciatica-given steroid taper scheduled Tylenol as needed hydrocodone ?Daughter at the bedside tells me she was having some bruising postprocedure by Dr. Ronald Lobo bruising however was on the right side where his low anterior resection was on the left-she had also been walking more frequently ?Also recent treatment 2 rounds of antibiotics Augmentin ?Because of unrelenting pain outpatient MRI performed showing phlegmon versus abscess L5-S1 with T1 abnormality in addition ? ?Sent to hospital for admission and found to be in new onset A-fib on evaluation ?BUN/creatinine 23/0.9,  ?CRP 5, ESR 48 (looks like had elevated ESR however previously in 2015) ?White count 12 platelet 350 predominant neutrophilia ?Blood culture X2--3/30 performed ? ?3/31-admit--MRI=Phlegmon 13m ,Disc aspiration=Enterococcus, CT abd/pelv--decreasing size seroma ?4/1-start Lovenox--adjust pain meds ?4/2-started endocarditis coverage with Unasyn-cardiology Dr. KDoylene Canardconsulted to coordinate TEE ? ?Hospital-Problem based course ? ?Discitis osteomyelitis L5-S1, 4 mm epidural phlegmon ??  Endocarditis (had upper respiratory infection for 2 weeks prior to  admission and feels this should be covered as per ID)- ?Status post IR guided disc aspiration 3/31---fluid culture growing Enterococcus--neurosurgery Dr. DReatha Armour IR input appreciated ?Current medications Unasyn, ceftriaxone, vancomycin per ID Dr. WJuleen China NSL iv ?CT 06/26/2021-descreased seroma size ?MRI confirms Discitis, ECHO 4/1 no veg- ?Dr. KDoylene Canardof cardiology to consult 4/2-will coordinate TEE in the next several days ?Norco 08/27/2023 q4 prn, mild pain Tylenol--IV morphine on board 1 mg every 3 as needed severe pain ?NGTD blood culture 3/30---NGTD repeat cultures 07/24/2021  ?White count remains elevated because Of underlying infection ?New onset A-fib-etiology probably secondary to infection ?CHA2DS2-VASc score=5 ?Metoprolol 12.5 twice daily given heart rates low 100s (only has intolerance not allergy) ?Start Lovenox 07/25/21--adjusted and keep on Lovenox until all procedures are completed ?Check a.m. magnesium ?Subclinical Hypothyroid ?T4 is 0.77 with elevated TSH --> subclinical hypothyroidism-would not adjust thyroxine when acutely ill-recheck TFT  3 to 4 weeks  ?Continue 100 mcg dose ?Prior recurrent C. Difficile ?Prolonged prior vancomycin taper plus Zinplava 04/2019--- needs vancomycin 125 p.o. daily prophylaxis once antibiotics started ?CKD 3B ?Mild hyperkalemia ?Hold irbesartan , Lasix 20 Aldactone ?Labs are improving-hyperkalemia has resolved ?Neuropathy NOS ?Continue gabapentin 200 twice daily carefully ?Anxiety ?Klonopin 0.5 at bedtime ? ?DVT prophylaxis: SCD ?Code Status: DNR confirmed at bedside ?Family Communication:  ?Discussed with daughter RWells Guileson phone as well as daughter at bedside ?Disposition:  ?Status is: Observation ?The patient will require care spanning > 2 midnights and should be moved to inpatient because:  ? ?Sick and needs work-up--likely CIR? ? ?  ?Consultants:  ?Infectious disease ?Neurosurgery ? ?Procedures: Multiple ? ?Antimicrobials: None as yet ? ?Subjective: ? ?Pain 5/10-still  difficulty sitting up in bed-explained natural history of this disease process to patient/family-do not think she will have immediate pain relief for the next several days but it will get better gradually ?This was explained in  detail ?Overall patient herself appears better-she is moving a little bit more-no diarrhea nor fever today ? ?Objective: ?Vitals:  ? 07/26/21 0225 07/26/21 0844 07/26/21 1218 07/26/21 1435  ?BP: 132/83 135/67 (!) 105/57 132/65  ?Pulse: 66 82 74   ?Resp: _0 ?Temp: 98.1 ?F (36.7 ?C) 98.4 ?F (36.9 ?C) 98.5 ?F (36.9 ?C) 98.8 ?F (37.1 ?C)  ?TempSrc: Oral Oral Oral Oral  ?SpO2: 93% 96% 95%   ?Weight:      ?Height:      ? ? ?Intake/Output Summary (Last 24 hours) at 07/26/2021 1510 ?Last data filed at 07/26/2021 2355 ?Gross per 24 hour  ?Intake 162 ml  ?Output 1750 ml  ?Net -1588 ml  ? ? ?Filed Weights  ? 07/23/21 1326 07/25/21 0500  ?Weight: 72.6 kg 70.3 kg  ? ? ?Examination: ? ?Frail white female no distress moderate dentition ?Poor exam to posterolateral lung fields ?S1-S2 irregularly irregular?  Murmur ?Abdomen soft slightly tender in right lower quadrant ?Straight leg raise bilaterally is somewhat antalgic ?Rest of neuro is grossly intact example touch ETC ?Psych euthymic coherent ? ?Data Reviewed: personally reviewed  ? ?CBC ?   ?Component Value Date/Time  ? WBC 15.1 (H) 07/26/2021 0524  ? RBC 4.02 07/26/2021 0524  ? HGB 11.6 (L) 07/26/2021 0524  ? HGB 13.7 04/09/2021 1417  ? HCT 35.9 (L) 07/26/2021 0524  ? HCT 42.0 04/09/2021 1417  ? PLT 288 07/26/2021 0524  ? PLT 259 04/09/2021 1417  ? MCV 89.3 07/26/2021 0524  ? MCV 88 04/09/2021 1417  ? MCH 28.9 07/26/2021 0524  ? MCHC 32.3 07/26/2021 0524  ? RDW 17.7 (H) 07/26/2021 0524  ? RDW 16.2 (H) 04/09/2021 1417  ? LYMPHSABS 2.3 07/26/2021 0524  ? LYMPHSABS 1.8 03/27/2021 0935  ? MONOABS 1.8 (H) 07/26/2021 0524  ? EOSABS 0.3 07/26/2021 0524  ? EOSABS 0.5 (H) 03/27/2021 0935  ? BASOSABS 0.0 07/26/2021 0524  ? BASOSABS 0.1 03/27/2021 0935  ? ? ?   Latest Ref Rng & Units 07/26/2021  ?  5:24 AM 07/25/2021  ?  5:44 AM 07/24/2021  ?  4:49 AM  ?CMP  ?Glucose 70 - 99 mg/dL 123   121   119    ?BUN 8 - 23 mg/dL _1 ?Creatinine 0.44 - 1.00 mg/dL 0.94   1.05   1.04    ?Sodium 135 - 145 mmol/L 131   134   132    ?Potassium 3.5 - 5.1 mmol/L 4.2   5.3   4.0    ?Chloride 98 - 111 mmol/L 96   99   97    ?CO2 22 - 32 mmol/L _2 ?Calcium 8.9 - 10.3 mg/dL 8.5   9.4   8.9    ?Total Protein 6.5 - 8.1 g/dL  6.6   6.5    ?Total Bilirubin 0.3 - 1.2 mg/dL  0.6   0.6    ?Alkaline Phos 38 - 126 U/L  89   92    ?AST 15 - 41 U/L  23   22    ?ALT 0 - 44 U/L  17   18    ? ? ? ?Radiology Studies: ?CT ABDOMEN PELVIS W CONTRAST ? ?Addendum Date: 07/24/2021   ?ADDENDUM REPORT: 07/24/2021 18:54 ADDENDUM: Imaging findings were relayed to Dr. Verlon Au by telephone call at 6:48 p.m. on 07/24/2021. Current study was compared with previous MR  lumbar spine examinations done on 07/23/2021 and 07/24/2021 which confirmed diskitis and osteomyelitis at L5-S1 level. Electronically Signed   By: Elmer Picker M.D.   On: 07/24/2021 18:54  ? ?Result Date: 07/24/2021 ?CLINICAL DATA:  Abdominal pain EXAM: CT ABDOMEN AND PELVIS WITH CONTRAST TECHNIQUE: Multidetector CT imaging of the abdomen and pelvis was performed using the standard protocol following bolus administration of intravenous contrast. RADIATION DOSE REDUCTION: This exam was performed according to the departmental dose-optimization program which includes automated exposure control, adjustment of the mA and/or kV according to patient size and/or use of iterative reconstruction technique. CONTRAST:  162m OMNIPAQUE IOHEXOL 350 MG/ML SOLN COMPARISON:  06/26/2021 FINDINGS: Lower chest: Coronary artery calcifications are seen. Small left pleural effusion is seen. Small patchy infiltrates are seen in the left lower lung fields. Hepatobiliary: No focal abnormality is seen in the liver. Gallbladder is distended. There is no wall  thickening. There is no dilation of bile ducts. Pancreas: There is pancreatic atrophy. No focal abnormality is seen. Spleen: Unremarkable. Adrenals/Urinary Tract: Adrenals are unremarkable. There is no hydro

## 2021-07-26 NOTE — Progress Notes (Signed)
Inpatient Rehab Admissions: ? ?Inpatient Rehab Consult received.  I met with patient and daughter, Marcie Bal at the bedside for rehabilitation assessment and to discuss goals and expectations of an inpatient rehab admission.  Both acknowledged understanding of CIR goals and expectations. Pt would like to discuss CIR with other daughter [name] before making a decision about pursuing CIR. Marcie Bal informed AC that pt does have 24/7 support at home. Will continue to follow. ? ?Signed: ?Gayland Curry, MS, CCC-SLP ?Admissions Coordinator ?[phone] ? ? ?

## 2021-07-26 NOTE — Progress Notes (Signed)
?   ? ?[facility] ? ?Date of Admission:  07/23/2021    ?       ?Reason for visit: Follow up on discitis/osteomyelitis ? ?Current antibiotics: ?Daptomycin 3/31-present ?Ceftriaxone 3/31-present ?Vancomycin p.o. 3/31-present ? ?ASSESSMENT:   ? ?88 y.o. female admitted with: ? ?Discitis/osteomyelitis of L5-S1: Infection initially noted on MRI without contrast 07/23/2021.  Follow-up MRI with contrast 3/31 also noted 4 mm epidural phlegmon without drainable component.  Status post IR disc space aspiration 3/31 with cultures growing Enterococcus faecalis.  Susceptibilities pending.  Her blood cultures are no growth to date. ?Intra-abdominal fluid collections: Repeat CT scan 3/31 noted improving loculated fluid collections consistent with improving postoperative seromas or resolving abscesses. ?Atrial fibrillation: In the setting of known valvular heart disease. ?CKD 3: Stable issue. ?History of recurrent C. difficile: Started on oral vancomycin prophylaxis yesterday. ?Recent surgery for rectal prolapse: Completed 05/26/2021 with symptoms leading to this hospitalization developing shortly after procedure. ? ?RECOMMENDATIONS:   ? ?Interestingly, patient received 2 weeks of Augmentin for an upper respiratory infection in early March.  It is possible that this 2-week course partially treated an intra-abdominal infection based on repeat CT scan showing improving fluid collections.  It is also possible that this 2-week course of therapy could have partially treated a bacteremia hence explain her negative blood cultures.  Her several weeks of symptoms and new onset atrial fibrillation remains a concern for subacute bacterial endocarditis especially with Enterococcus faecalis growing in her disc aspirate cultures ?Recommend TEE to definitively exclude endocarditis as this would significantly alter our antibiotic management ?Will empirically treat for enterococcal endocarditis pending TEE ?Discontinue  daptomycin ?Add Unasyn to include anaerobic coverage in case her intra-abdominal collections indeed represent abscess ?Change ceftriaxone to every 12 hours ?Follow-up cultures ?Will follow ? ? ?Principal Problem: ?  Lumbar discitis ?Active Problems: ?  Hypothyroidism ?  HLD (hyperlipidemia) ?  Hypertension, essential ?  Valvular heart disease ?  Stage 3a chronic kidney disease (CKD) ([place]) ?  Right-sided low back pain with right-sided sciatica ?  New onset a-fib [facility]) ?  Disc disorder ? ? ? ?MEDICATIONS:   ? ?Scheduled Meds: ? brimonidine  1 drop Both Eyes Daily  ? clonazePAM  0.5 mg Oral QHS  ? enoxaparin (LOVENOX) injection  70 mg Subcutaneous BID  ? gabapentin  200 mg Oral BID  ? latanoprost  1 drop Both Eyes QHS  ? levothyroxine  100 mcg Oral Daily  ? metoprolol tartrate  12.5 mg Oral BID  ? sertraline  100 mg Oral q AM  ? vancomycin  125 mg Oral QHS  ? ?Continuous Infusions: ? cefTRIAXone (ROCEPHIN)  IV Stopped (07/25/21 2159)  ? DAPTOmycin (CUBICIN)  IV Stopped (07/25/21 2036)  ? ?PRN Meds:.acetaminophen **OR** acetaminophen, Gerhardt's butt cream, HYDROcodone-acetaminophen, morphine injection, ondansetron **OR** ondansetron (ZOFRAN) IV ? ?SUBJECTIVE:  ? ?24 hour events:  ?Febrile, Tmax 101.1 ?WBC stable at 15 ?Creatinine stable at 0.9, creatinine clearance 37 ? ?Patient seen this morning at the bedside with her daughter.  There was a fever yesterday afternoon recorded.  She continues to report significant back pain.  She is hopeful that this will start to improve soon.  She was seen by physical therapy yesterday who recommended rehab consult. ? ?Review of Systems  ?All other systems reviewed and are negative. ? ?  ?OBJECTIVE:  ? ?Blood pressure 135/67, pulse 82, temperature 98.4 ?F (36.9 ?C), temperature source Oral, resp. rate 18, height '5\' 1"'$  (1.549 m), weight 70.3 kg,  SpO2 96 %. ?Body mass index is 29.28 kg/m?. ? ?Physical Exam ?Constitutional:   ?   General: She is not in acute distress. ?   Appearance:  Normal appearance.  ?HENT:  ?   Head: Normocephalic and atraumatic.  ?Eyes:  ?   Extraocular Movements: Extraocular movements intact.  ?   Conjunctiva/sclera: Conjunctivae normal.  ?Pulmonary:  ?   Effort: Pulmonary effort is normal. No respiratory distress.  ?Abdominal:  ?   General: There is no distension.  ?   Palpations: Abdomen is soft.  ?Musculoskeletal:  ?   Cervical back: Normal range of motion and neck supple.  ?   Right lower leg: No edema.  ?   Left lower leg: No edema.  ?Skin: ?   General: Skin is warm and dry.  ?   Findings: No rash.  ?Neurological:  ?   General: No focal deficit present.  ?   Mental Status: She is alert. Mental status is at baseline.  ?Psychiatric:     ?   Mood and Affect: Mood normal.     ?   Behavior: Behavior normal.  ? ? ? ?Lab Results: ?Lab Results  ?Component Value Date  ? WBC 15.1 (H) 07/26/2021  ? HGB 11.6 (L) 07/26/2021  ? HCT 35.9 (L) 07/26/2021  ? MCV 89.3 07/26/2021  ? PLT 288 07/26/2021  ?  ?Lab Results  ?Component Value Date  ? NA 131 (L) 07/26/2021  ? K 4.2 07/26/2021  ? CO2 26 07/26/2021  ? GLUCOSE 123 (H) 07/26/2021  ? BUN 12 07/26/2021  ? CREATININE 0.94 07/26/2021  ? CALCIUM 8.5 (L) 07/26/2021  ? GFRNONAA 58 (L) 07/26/2021  ? GFRAA 58 (L) 03/28/2019  ?  ?Lab Results  ?Component Value Date  ? ALT 17 07/25/2021  ? AST 23 07/25/2021  ? ALKPHOS 89 07/25/2021  ? BILITOT 0.6 07/25/2021  ? ? ?   ?Component Value Date/Time  ? CRP 5.0 (H) 07/23/2021 1555  ? ? ?   ?Component Value Date/Time  ? ESRSEDRATE 48 (H) 07/23/2021 1555  ? ?  ?I have reviewed the micro and lab results in Epic. ? ?Imaging: ?MR LUMBAR SPINE W CONTRAST ? ?Result Date: 07/24/2021 ?CLINICAL DATA:  Myelopathy, acute, lumbar spine Pt with MRI w/o contrast 3/30 showing discitis/OM. ? of epidural abscess and would like to further evaluate EXAM: MRI LUMBAR SPINE WITH CONTRAST TECHNIQUE: Multiplanar and multiecho pulse sequences of the lumbar spine were obtained with intravenous contrast. CONTRAST:  43m GADAVIST  GADOBUTROL 1 MMOL/ML IV SOLN COMPARISON:  MRI lumbar spine march 30, 23. FINDINGS: Postcontrast imaging was obtained to further assess findings on MRI lumbar spine from yesterday. This demonstrates extensive enhancement about the L5 and S1 vertebral bodies, compatible with osteomyelitis. There is also enhancement of the L5-S1 disc space, compatible with discitis. Enhancement and thickening of the ventral epidural space (up to 4 mm thick) at these levels, compatible with epidural phlegmon without discrete, drainable component. Also, enhancement of the surrounding paraspinal soft tissues, compatible with paraspinal extension of infection. Please see MRI from yesterday for additional findings, including level by level degenerative change. IMPRESSION: Postcontrast imaging was obtained to further assess findings on MRI lumbar spine from yesterday. Extensive enhancement of the L5 and S1 vertebral bodies and intervening disc space, compatible with discitis/osteomyelitis. Enhancement of the ventral epidural space at these levels, compatible with ventral epidural phlegmon (measuring 4 mm thick) without discrete, drainable component. And anterior paraspinal extension of infection. Electronically Signed   By: FAlbertina Parr  Adah Salvage M.D.   On: 07/24/2021 12:44  ? ?CT ABDOMEN PELVIS W CONTRAST ? ?Addendum Date: 07/24/2021   ?ADDENDUM REPORT: 07/24/2021 18:54 ADDENDUM: Imaging findings were relayed to Dr. Verlon Au by telephone call at 6:48 p.m. on 07/24/2021. Current study was compared with previous MR lumbar spine examinations done on 07/23/2021 and 07/24/2021 which confirmed diskitis and osteomyelitis at L5-S1 level. Electronically Signed   By: Elmer Picker M.D.   On: 07/24/2021 18:54  ? ?Result Date: 07/24/2021 ?CLINICAL DATA:  Abdominal pain EXAM: CT ABDOMEN AND PELVIS WITH CONTRAST TECHNIQUE: Multidetector CT imaging of the abdomen and pelvis was performed using the standard protocol following bolus administration of  intravenous contrast. RADIATION DOSE REDUCTION: This exam was performed according to the departmental dose-optimization program which includes automated exposure control, adjustment of the mA and/or kV according to

## 2021-07-26 NOTE — Consult Note (Signed)
Referring Physician: Alice Rieger, MD ? ?Brooke Beasley is an 88 y.o. female.                       ?Chief Complaint: Discitis/Osteomyelitis, r/o endocarditis ? ?HPI: 88 years old white female with Discitis/Osteomyelitis had enterococcus growth. She also has atrial fibrillation ? ?Past Medical History:  ?Diagnosis Date  ? Anxiety   ? C. difficile diarrhea 07/11/2018  ? S/p multiple oral vanc treatments (course and tapers) and completed Zinplava monoclonal Ab treatment (05/10/2019) Fecal transplant on hold during [place] pandemic  ? CAP (community acquired pneumonia) 12/22/2017  ? Carotid stenosis 10/18/2014  ? R 1-39%, L 40-59%, rpt 1 yr (09/2014)   ? CKD (chronic kidney disease) stage 3, GFR 30-59 ml/min (HCC) 08/02/2014  ? Closed fracture of right orbit ([place]) 02/10/2021  ? Clostridioides difficile infection   ? hx 2020  ? COVID-19 virus infection 10/13/2020  ? Degenerative disc disease   ? LS  ? Depression   ? nervous breakdonw in 1964-out of work for a year  ? Diverticulosis   ? severe by colonoscopy  ? Family history of adverse reaction to anesthesia   ? n/v  ? GERD (gastroesophageal reflux disease)   ? Glaucoma   ? Heart murmur 08/2011  ? mitral regurge - on echo   ? History of hiatal hernia   ? History of shingles   ? HTN (hypertension)   ? Hyperlipidemia   ? Hypertension   ? Hypothyroidism   ? IBS 11/02/2006  ? Intestinal bacterial overgrowth   ? In small colon  ? Left shoulder pain 11/04/2014  ? Lower GI bleed 12/2020  ? thought diverticular complicated by ABLA with syncope and orbital fracture s/p hospitalization  ? Maxillary fracture ([place]) 04/08/2012  ? Osteoarthritis 2016  ? Jefm Bryant)  ? Osteoporosis   ? dexa 2011  ? Pseudogout 2016  ? shoulders ([place])  ?  ? ? ?Past Surgical History:  ?Procedure Laterality Date  ? barium enema  2012  ? severe diverticulosis, redundant colon  ? Bowel obstruction  1999  ? no surgery in hosp x 3 days  ? COLONOSCOPY  1. 1999  2. 11/04  ? 1. Not finished  2. Slight  hemorrhage rectosigmoid area, severe sig diverticulosis  ? COLONOSCOPY WITH PROPOFOL N/A 09/10/2019  ? SSP with dysplasia, TA, rpt 3 yrs (Vanga, Tally Due, MD)  ? Dexa  1. 1610-9604   2. 9/04  3. 3/08   ? 1. OP  2. OP, borderline, spine -2.44T  3. decreased BMD-OP  ? DG KNEE 1-2 VIEWS BILAT    ? LS x-ray with degenerative disc and facet change  ? ESOPHAGOGASTRODUODENOSCOPY    ? Negative  ? FLEXIBLE SIGMOIDOSCOPY N/A 01/16/2021  ? Procedure: FLEXIBLE SIGMOIDOSCOPY;  Surgeon: Gatha Mayer, MD;  Location[facility] ENDOSCOPY;  Service: Endoscopy;  Laterality: N/A;  or unsedated  ? Hemorrhoid procedure  07/2006  ? PROCTOSCOPY N/A 05/26/2021  ? Procedure: RIGID PROCTOSCOPY;  Surgeon: Michael Boston, MD;  Location: [facility] ORS;  Service: General;  Laterality: N/A;  ? RECTOPEXY N/A 05/26/2021  ? Procedure: RECTOPEXY;  Surgeon: Michael Boston, MD;  Location: [facility] ORS;  Service: General;  Laterality: N/A;  ? TONSILLECTOMY    ? TOTAL SHOULDER ARTHROPLASTY Left 11/27/2015  ? Corky Mull, MD  ? TOTAL SHOULDER REVISION Left 03/16/2016  ? Procedure: TOTAL SHOULDER REVISION;  Surgeon: Corky Mull, MD;  Location: [facility] ORS;  Service: Orthopedics;  Laterality: Left;  ?  US ECHOCARDIOGRAPHY  07/2011  ? Normal systolic fxn with EF 89-38%.  Focal basal septal hypertrophy.  Mild diastolic dysfunction.  Mild MR.  ? XI ROBOTIC ASSISTED LOWER ANTERIOR RESECTION N/A 05/26/2021  ? Procedure: ROBOTIC LOW ANTERIOR RECTOSIGMOID RESECTION; ASSESSMENT OF TISSUE PERFUSION WITH FIREFLY;  Surgeon: Michael Boston, MD;  Location: [facility] ORS;  Service: General;  Laterality: N/A;  ? ? ?Family History  ?Problem Relation Age of Onset  ? Diabetes Brother   ?     post-op  ? Coronary artery disease Brother   ? Diabetes Paternal Grandfather   ? Breast cancer Cousin   ? ?Social History:  reports that she has never smoked. She has never used smokeless tobacco. She reports that she does not drink alcohol and does not use drugs. ? ?Allergies:  ?Allergies  ?Allergen Reactions  ?  Amlodipine Other (See Comments)  ?  Stomach pains--"feel like my insides are on fire"  ? Ace Inhibitors Cough  ? Carvedilol Other (See Comments)  ?  fatigue  ? Losartan Other (See Comments)  ?  Cr bumped  ? Other Other (See Comments)  ?  No seeds or corn because of IBS  ? Paroxetine Hcl Other (See Comments)  ?  Not effective - felt ill on this medicine  ? Risedronate Sodium   ?  REACTION: joint pain  ? Simvastatin   ?  REACTION: the full 20 mg pill causes leg pain- can tol 10 mg  ? Toprol Xl [Metoprolol] Diarrhea  ?  Diarrhea and weakness  ? Alendronate Sodium Palpitations  ? Influenza Vaccines Rash and Other (See Comments)  ?  Led to mental breakdown in 66s ?  ? Silenor [Doxepin Hcl] Palpitations  ?  Hallucinations and racing heart  ? Sulfonamide Derivatives Rash  ? ? ?Medications Prior to Admission  ?Medication Sig Dispense Refill  ? acetaminophen (TYLENOL) 500 MG tablet Take 500-1,000 mg by mouth every 6 (six) hours as needed for moderate pain. Depends on pain    ? albuterol (PROVENTIL HFA;VENTOLIN HFA) 108 (90 Base) MCG/ACT inhaler Inhale 2 puffs into the lungs every 6 (six) hours as needed for wheezing or shortness of breath. 1 Inhaler 0  ? ALPHAGAN P 0.1 % SOLN Place 1 drop into both eyes daily.    ? bimatoprost (LUMIGAN) 0.01 % SOLN Place 1 drop into both eyes at bedtime.    ? Bioflavonoid Products (VITAMIN C) CHEW Chew 1 tablet by mouth daily.    ? bisacodyl (DULCOLAX) 5 MG EC tablet Take 1 tablet (5 mg total) by mouth daily as needed for moderate constipation. 30 tablet 1  ? cholecalciferol (VITAMIN D) 1000 units tablet Take 1,000 Units by mouth daily.    ? clonazePAM (KLONOPIN) 0.5 MG tablet TAKE 1 TABLET BY MOUTH AT BEDTIME (Patient taking differently: Take 0.5 mg by mouth at bedtime.) 30 tablet 0  ? colchicine 0.6 MG tablet Take 1 tablet (0.6 mg total) by mouth daily as needed (gout flare). 90 tablet 0  ? Famotidine-Ca Carb-Mag Hydrox (PEPCID COMPLETE PO) Take 1 tablet by mouth daily as needed  (indigestion).    ? furosemide (LASIX) 20 MG tablet Take 20 mg by mouth daily as needed for edema or fluid.    ? gabapentin (NEURONTIN) 100 MG capsule Take 2 capsules by mouth 2 times daily. (Patient taking differently: Take 200 mg by mouth 3 (three) times daily.) 40 capsule 1  ? HYDROcodone-acetaminophen (NORCO/VICODIN) 5-325 MG tablet Take 1 tablet by mouth 3 (three) times daily  as needed for moderate pain or severe pain. 20 tablet 0  ? hydrocortisone 2.5 % cream Place 1 application rectally daily as needed (hemorrhoids).    ? Infant Care Products [facility]) OINT Apply 1 application topically 2 (two) times daily as needed. 430 g 0  ? irbesartan (AVAPRO) 150 MG tablet Take 1 tablet (150 mg total) by mouth daily. 90 tablet 3  ? Iron-FA-B Cmp-C-Biot-Probiotic (FUSION PLUS) CAPS Take 1 capsule by mouth once daily 30 capsule 2  ? levothyroxine (SYNTHROID) 100 MCG tablet Take 1 tablet by mouth once daily 90 tablet 1  ? Misc Natural Products (OSTEO BI-FLEX TRIPLE STRENGTH PO) Take 1 tablet by mouth daily.    ? Multiple Vitamin (MULTIVITAMIN ADULT PO) Take by mouth daily.    ? ondansetron (ZOFRAN) 4 MG tablet Take 1 tablet by mouth every 8 hours as needed for nausea or refractory nausea / vomiting. 8 tablet 5  ? polyethylene glycol (MIRALAX / GLYCOLAX) 17 g packet Take 17 g by mouth daily.    ? sertraline (ZOLOFT) 100 MG tablet Take 100 mg by mouth in the morning.    ? Simethicone 180 MG CAPS Take 1 capsule by mouth daily as needed (indigestion).    ? spironolactone (ALDACTONE) 25 MG tablet Take 1 tablet (25 mg total) by mouth daily. 90 tablet 3  ? traMADol (ULTRAM) 50 MG tablet Take 50 mg by mouth every 6 (six) hours as needed for moderate pain.    ? VOLTAREN 1 % GEL APPLY ONE APPLICATION TOPICALLY 3 TIMES DAILY (Patient taking differently: Apply 2 g topically 3 (three) times daily as needed (arthritis pain).) 100 g 3  ? Wheat Dextrin (BENEFIBER DRINK MIX PO) Take 15 mLs by mouth daily.    ? benzonatate (TESSALON) 100  MG capsule Take 1 capsule (100 mg total) by mouth 3 (three) times daily as needed for cough. (Patient not taking: Reported on 07/23/2021) 30 capsule 0  ? denosumab (PROLIA) 60 MG/ML SOSY injection Inject 60 mg into the

## 2021-07-26 NOTE — Progress Notes (Signed)
Pharmacy Antibiotic Note ? ?Brooke Beasley is a 88 y.o. female admitted on 07/23/2021 with concern for subacute bacterial endocarditis.  Pharmacy has been consulted for Unasyn dosing. Per discussion with Dr. Juleen China, DC daptomycin and transition to Unasyn. ? ?Plan: ?Unasyn 3g q6h ?Continue ceftriaxone 2g Q12h ?Continue PO vancomycin '125mg'$  QHS ?F/u clinical improvement, cultures ? ?Height: '5\' 1"'$  (154.9 cm) ?Weight: 70.3 kg (154 lb 15.7 oz) ?IBW/kg (Calculated) : 47.8 ? ?Temp (24hrs), Avg:98.9 ?F (37.2 ?C), Min:98.1 ?F (36.7 ?C), Max:101.1 ?F (38.4 ?C) ? ?Recent Labs  ?Lab 07/23/21 ?1555 07/24/21 ?2585 07/25/21 ?2778 07/26/21 ?2423  ?WBC 12.3* 16.1* 15.6* 15.1*  ?CREATININE 0.90 1.04* 1.05* 0.94  ?  ?Estimated Creatinine Clearance: 37.1 mL/min (by C-G formula based on SCr of 0.94 mg/dL).   ? ?Allergies  ?Allergen Reactions  ? Amlodipine Other (See Comments)  ?  Stomach pains--"feel like my insides are on fire"  ? Ace Inhibitors Cough  ? Carvedilol Other (See Comments)  ?  fatigue  ? Losartan Other (See Comments)  ?  Cr bumped  ? Other Other (See Comments)  ?  No seeds or corn because of IBS  ? Paroxetine Hcl Other (See Comments)  ?  Not effective - felt ill on this medicine  ? Risedronate Sodium   ?  REACTION: joint pain  ? Simvastatin   ?  REACTION: the full 20 mg pill causes leg pain- can tol 10 mg  ? Toprol Xl [Metoprolol] Diarrhea  ?  Diarrhea and weakness  ? Alendronate Sodium Palpitations  ? Influenza Vaccines Rash and Other (See Comments)  ?  Led to mental breakdown in 30s ?  ? Silenor [Doxepin Hcl] Palpitations  ?  Hallucinations and racing heart  ? Sulfonamide Derivatives Rash  ? ? ?Antimicrobials this admission: ?Daptomycin 3/31 >> 4/01 ?Vanc PO 3/31 >> ?Ceftriaxone 3/31 >>  ? ? ?Microbiology results: ?3/30 BCx: NGTD ?3/31 BCx: NGTD ?3/31 Intervertebral fluid Cx: E. Faecalis  ?3/30 MRSA PCR: negative ? ? ?Thank you for allowing pharmacy to be a part of this patient?s care. ? ?Ardyth Harps,  PharmD ?Clinical Pharmacist ? ? ?

## 2021-07-26 NOTE — H&P (View-Only) (Signed)
Referring Physician: Alice Rieger, MD ? ?Brooke Beasley is an 88 y.o. female.                       ?Chief Complaint: Discitis/Osteomyelitis, r/o endocarditis ? ?HPI: 88 years old white female with Discitis/Osteomyelitis had enterococcus growth. She also has atrial fibrillation ? ?Past Medical History:  ?Diagnosis Date  ? Anxiety   ? C. difficile diarrhea 07/11/2018  ? S/p multiple oral vanc treatments (course and tapers) and completed Zinplava monoclonal Ab treatment (05/10/2019) Fecal transplant on hold during [place] pandemic  ? CAP (community acquired pneumonia) 12/22/2017  ? Carotid stenosis 10/18/2014  ? R 1-39%, L 40-59%, rpt 1 yr (09/2014)   ? CKD (chronic kidney disease) stage 3, GFR 30-59 ml/min (HCC) 08/02/2014  ? Closed fracture of right orbit ([place]) 02/10/2021  ? Clostridioides difficile infection   ? hx 2020  ? COVID-19 virus infection 10/13/2020  ? Degenerative disc disease   ? LS  ? Depression   ? nervous breakdonw in 1964-out of work for a year  ? Diverticulosis   ? severe by colonoscopy  ? Family history of adverse reaction to anesthesia   ? n/v  ? GERD (gastroesophageal reflux disease)   ? Glaucoma   ? Heart murmur 08/2011  ? mitral regurge - on echo   ? History of hiatal hernia   ? History of shingles   ? HTN (hypertension)   ? Hyperlipidemia   ? Hypertension   ? Hypothyroidism   ? IBS 11/02/2006  ? Intestinal bacterial overgrowth   ? In small colon  ? Left shoulder pain 11/04/2014  ? Lower GI bleed 12/2020  ? thought diverticular complicated by ABLA with syncope and orbital fracture s/p hospitalization  ? Maxillary fracture ([place]) 04/08/2012  ? Osteoarthritis 2016  ? Jefm Bryant)  ? Osteoporosis   ? dexa 2011  ? Pseudogout 2016  ? shoulders ([place])  ?  ? ? ?Past Surgical History:  ?Procedure Laterality Date  ? barium enema  2012  ? severe diverticulosis, redundant colon  ? Bowel obstruction  1999  ? no surgery in hosp x 3 days  ? COLONOSCOPY  1. 1999  2. 11/04  ? 1. Not finished  2. Slight  hemorrhage rectosigmoid area, severe sig diverticulosis  ? COLONOSCOPY WITH PROPOFOL N/A 09/10/2019  ? SSP with dysplasia, TA, rpt 3 yrs ([place], Tally Due, MD)  ? Dexa  1. [id number]   2. 9/04  3. 3/08   ? 1. OP  2. OP, borderline, spine -2.44T  3. decreased BMD-OP  ? DG KNEE 1-2 VIEWS BILAT    ? LS x-ray with degenerative disc and facet change  ? ESOPHAGOGASTRODUODENOSCOPY    ? Negative  ? FLEXIBLE SIGMOIDOSCOPY N/A 01/16/2021  ? Procedure: FLEXIBLE SIGMOIDOSCOPY;  Surgeon: Gatha Mayer, MD;  Location: [facility] ENDOSCOPY;  Service: Endoscopy;  Laterality: N/A;  or unsedated  ? Hemorrhoid procedure  07/2006  ? PROCTOSCOPY N/A 05/26/2021  ? Procedure: RIGID PROCTOSCOPY;  Surgeon: Michael Boston, MD;  Location: [facility] ORS;  Service: General;  Laterality: N/A;  ? RECTOPEXY N/A 05/26/2021  ? Procedure: RECTOPEXY;  Surgeon: Michael Boston, MD;  Location: [facility] ORS;  Service: General;  Laterality: N/A;  ? TONSILLECTOMY    ? TOTAL SHOULDER ARTHROPLASTY Left 11/27/2015  ? Corky Mull, MD  ? TOTAL SHOULDER REVISION Left 03/16/2016  ? Procedure: TOTAL SHOULDER REVISION;  Surgeon: Corky Mull, MD;  Location: [facility] ORS;  Service: Orthopedics;  Laterality: Left;  ?  US ECHOCARDIOGRAPHY  07/2011  ? Normal systolic fxn with EF 08-65%.  Focal basal septal hypertrophy.  Mild diastolic dysfunction.  Mild MR.  ? XI ROBOTIC ASSISTED LOWER ANTERIOR RESECTION N/A 05/26/2021  ? Procedure: ROBOTIC LOW ANTERIOR RECTOSIGMOID RESECTION; ASSESSMENT OF TISSUE PERFUSION WITH FIREFLY;  Surgeon: Michael Boston, MD;  Location: [facility] ORS;  Service: General;  Laterality: N/A;  ? ? ?Family History  ?Problem Relation Age of Onset  ? Diabetes Brother   ?     post-op  ? Coronary artery disease Brother   ? Diabetes Paternal Grandfather   ? Breast cancer Cousin   ? ?Social History:  reports that she has never smoked. She has never used smokeless tobacco. She reports that she does not drink alcohol and does not use drugs. ? ?Allergies:  ?Allergies  ?Allergen Reactions  ?  Amlodipine Other (See Comments)  ?  Stomach pains--"feel like my insides are on fire"  ? Ace Inhibitors Cough  ? Carvedilol Other (See Comments)  ?  fatigue  ? Losartan Other (See Comments)  ?  Cr bumped  ? Other Other (See Comments)  ?  No seeds or corn because of IBS  ? Paroxetine Hcl Other (See Comments)  ?  Not effective - felt ill on this medicine  ? Risedronate Sodium   ?  REACTION: joint pain  ? Simvastatin   ?  REACTION: the full 20 mg pill causes leg pain- can tol 10 mg  ? Toprol Xl [Metoprolol] Diarrhea  ?  Diarrhea and weakness  ? Alendronate Sodium Palpitations  ? Influenza Vaccines Rash and Other (See Comments)  ?  Led to mental breakdown in 52s ?  ? Silenor [Doxepin Hcl] Palpitations  ?  Hallucinations and racing heart  ? Sulfonamide Derivatives Rash  ? ? ?Medications Prior to Admission  ?Medication Sig Dispense Refill  ? acetaminophen (TYLENOL) 500 MG tablet Take 500-1,000 mg by mouth every 6 (six) hours as needed for moderate pain. Depends on pain    ? albuterol (PROVENTIL HFA;VENTOLIN HFA) 108 (90 Base) MCG/ACT inhaler Inhale 2 puffs into the lungs every 6 (six) hours as needed for wheezing or shortness of breath. 1 Inhaler 0  ? ALPHAGAN P 0.1 % SOLN Place 1 drop into both eyes daily.    ? bimatoprost (LUMIGAN) 0.01 % SOLN Place 1 drop into both eyes at bedtime.    ? Bioflavonoid Products (VITAMIN C) CHEW Chew 1 tablet by mouth daily.    ? bisacodyl (DULCOLAX) 5 MG EC tablet Take 1 tablet (5 mg total) by mouth daily as needed for moderate constipation. 30 tablet 1  ? cholecalciferol (VITAMIN D) 1000 units tablet Take 1,000 Units by mouth daily.    ? clonazePAM (KLONOPIN) 0.5 MG tablet TAKE 1 TABLET BY MOUTH AT BEDTIME (Patient taking differently: Take 0.5 mg by mouth at bedtime.) 30 tablet 0  ? colchicine 0.6 MG tablet Take 1 tablet (0.6 mg total) by mouth daily as needed (gout flare). 90 tablet 0  ? Famotidine-Ca Carb-Mag Hydrox (PEPCID COMPLETE PO) Take 1 tablet by mouth daily as needed  (indigestion).    ? furosemide (LASIX) 20 MG tablet Take 20 mg by mouth daily as needed for edema or fluid.    ? gabapentin (NEURONTIN) 100 MG capsule Take 2 capsules by mouth 2 times daily. (Patient taking differently: Take 200 mg by mouth 3 (three) times daily.) 40 capsule 1  ? HYDROcodone-acetaminophen (NORCO/VICODIN) 5-325 MG tablet Take 1 tablet by mouth 3 (three) times daily  as needed for moderate pain or severe pain. 20 tablet 0  ? hydrocortisone 2.5 % cream Place 1 application rectally daily as needed (hemorrhoids).    ? Infant Care Products [facility]) OINT Apply 1 application topically 2 (two) times daily as needed. 430 g 0  ? irbesartan (AVAPRO) 150 MG tablet Take 1 tablet (150 mg total) by mouth daily. 90 tablet 3  ? Iron-FA-B Cmp-C-Biot-Probiotic (FUSION PLUS) CAPS Take 1 capsule by mouth once daily 30 capsule 2  ? levothyroxine (SYNTHROID) 100 MCG tablet Take 1 tablet by mouth once daily 90 tablet 1  ? Misc Natural Products (OSTEO BI-FLEX TRIPLE STRENGTH PO) Take 1 tablet by mouth daily.    ? Multiple Vitamin (MULTIVITAMIN ADULT PO) Take by mouth daily.    ? ondansetron (ZOFRAN) 4 MG tablet Take 1 tablet by mouth every 8 hours as needed for nausea or refractory nausea / vomiting. 8 tablet 5  ? polyethylene glycol (MIRALAX / GLYCOLAX) 17 g packet Take 17 g by mouth daily.    ? sertraline (ZOLOFT) 100 MG tablet Take 100 mg by mouth in the morning.    ? Simethicone 180 MG CAPS Take 1 capsule by mouth daily as needed (indigestion).    ? spironolactone (ALDACTONE) 25 MG tablet Take 1 tablet (25 mg total) by mouth daily. 90 tablet 3  ? traMADol (ULTRAM) 50 MG tablet Take 50 mg by mouth every 6 (six) hours as needed for moderate pain.    ? VOLTAREN 1 % GEL APPLY ONE APPLICATION TOPICALLY 3 TIMES DAILY (Patient taking differently: Apply 2 g topically 3 (three) times daily as needed (arthritis pain).) 100 g 3  ? Wheat Dextrin (BENEFIBER DRINK MIX PO) Take 15 mLs by mouth daily.    ? benzonatate (TESSALON) 100  MG capsule Take 1 capsule (100 mg total) by mouth 3 (three) times daily as needed for cough. (Patient not taking: Reported on 07/23/2021) 30 capsule 0  ? denosumab (PROLIA) 60 MG/ML SOSY injection Inject 60 mg into the

## 2021-07-27 ENCOUNTER — Inpatient Hospital Stay (HOSPITAL_COMMUNITY): Payer: PPO

## 2021-07-27 ENCOUNTER — Inpatient Hospital Stay: Payer: Self-pay

## 2021-07-27 ENCOUNTER — Other Ambulatory Visit (HOSPITAL_COMMUNITY): Payer: PPO

## 2021-07-27 ENCOUNTER — Encounter (HOSPITAL_COMMUNITY)
Admission: EM | Disposition: A | Discharge status: Home Health | Payer: Self-pay | Source: Home / Self Care | Attending: Internal Medicine

## 2021-07-27 ENCOUNTER — Encounter (HOSPITAL_COMMUNITY): Payer: Self-pay | Admitting: Family Medicine

## 2021-07-27 ENCOUNTER — Inpatient Hospital Stay (HOSPITAL_COMMUNITY): Payer: PPO | Admitting: Anesthesiology

## 2021-07-27 DIAGNOSIS — I088 Other rheumatic multiple valve diseases: Secondary | ICD-10-CM

## 2021-07-27 DIAGNOSIS — I4891 Unspecified atrial fibrillation: Secondary | ICD-10-CM | POA: Diagnosis not present

## 2021-07-27 DIAGNOSIS — M4646 Discitis, unspecified, lumbar region: Secondary | ICD-10-CM | POA: Diagnosis not present

## 2021-07-27 DIAGNOSIS — I10 Essential (primary) hypertension: Secondary | ICD-10-CM

## 2021-07-27 DIAGNOSIS — N1831 Chronic kidney disease, stage 3a: Secondary | ICD-10-CM | POA: Diagnosis not present

## 2021-07-27 DIAGNOSIS — D638 Anemia in other chronic diseases classified elsewhere: Secondary | ICD-10-CM

## 2021-07-27 DIAGNOSIS — I38 Endocarditis, valve unspecified: Secondary | ICD-10-CM | POA: Diagnosis not present

## 2021-07-27 DIAGNOSIS — E039 Hypothyroidism, unspecified: Secondary | ICD-10-CM

## 2021-07-27 HISTORY — PX: BUBBLE STUDY: SHX6837

## 2021-07-27 HISTORY — PX: TEE WITHOUT CARDIOVERSION: SHX5443

## 2021-07-27 LAB — COMPREHENSIVE METABOLIC PANEL
ALT: 13 U/L (ref 0–44)
AST: 17 U/L (ref 15–41)
Albumin: 2.3 g/dL — ABNORMAL LOW (ref 3.5–5.0)
Alkaline Phosphatase: 77 U/L (ref 38–126)
Anion gap: 9 (ref 5–15)
BUN: 12 mg/dL (ref 8–23)
CO2: 24 mmol/L (ref 22–32)
Calcium: 8.2 mg/dL — ABNORMAL LOW (ref 8.9–10.3)
Chloride: 98 mmol/L (ref 98–111)
Creatinine, Ser: 0.77 mg/dL (ref 0.44–1.00)
GFR, Estimated: >60 mL/min (ref >60)
Glucose, Bld: 124 mg/dL — ABNORMAL HIGH (ref 70–99)
Potassium: 3.8 mmol/L (ref 3.5–5.1)
Sodium: 131 mmol/L — ABNORMAL LOW (ref 135–145)
Total Bilirubin: 0.1 mg/dL — ABNORMAL LOW (ref 0.3–1.2)
Total Protein: 5.5 g/dL — ABNORMAL LOW (ref 6.5–8.1)

## 2021-07-27 LAB — MAGNESIUM: Magnesium: 1.8 mg/dL (ref 1.7–2.4)

## 2021-07-27 SURGERY — ECHOCARDIOGRAM, TRANSESOPHAGEAL
Anesthesia: Monitor Anesthesia Care

## 2021-07-27 MED ORDER — APIXABAN 5 MG PO TABS
5.0000 mg | ORAL_TABLET | Freq: Two times a day (BID) | ORAL | Status: DC
  Administered 2021-07-27 – 2021-07-31 (×8): 5 mg via ORAL
  Filled 2021-07-27 (×8): qty 1

## 2021-07-27 MED ORDER — SODIUM CHLORIDE 0.9% FLUSH: 10.0000 mL | INTRAVENOUS | Status: DC | PRN

## 2021-07-27 MED ORDER — SODIUM CHLORIDE 0.9% FLUSH
10.0000 mL | Freq: Two times a day (BID) | INTRAVENOUS | Status: DC
  Administered 2021-07-28 – 2021-08-05 (×14): 10 mL

## 2021-07-27 MED ORDER — GABAPENTIN 300 MG PO CAPS
300.0000 mg | ORAL_CAPSULE | Freq: Three times a day (TID) | ORAL | Status: DC
  Administered 2021-07-27 (×2): 300 mg via ORAL
  Filled 2021-07-27 (×2): qty 1

## 2021-07-27 MED ORDER — NAPROXEN 250 MG PO TABS
375.0000 mg | ORAL_TABLET | Freq: Two times a day (BID) | ORAL | Status: DC
  Administered 2021-07-27 – 2021-07-29 (×4): 375 mg via ORAL
  Filled 2021-07-27 (×4): qty 2

## 2021-07-27 MED ORDER — PROPOFOL 10 MG/ML IV BOLUS
INTRAVENOUS | Status: DC | PRN
  Administered 2021-07-27: 10 mg via INTRAVENOUS

## 2021-07-27 MED ORDER — PROPOFOL 500 MG/50ML IV EMUL
INTRAVENOUS | Status: DC | PRN
  Administered 2021-07-27: 150 ug/kg/min via INTRAVENOUS

## 2021-07-27 MED ORDER — MAGNESIUM OXIDE -MG SUPPLEMENT 400 (240 MG) MG PO TABS
800.0000 mg | ORAL_TABLET | Freq: Two times a day (BID) | ORAL | Status: DC
  Administered 2021-07-27 – 2021-08-06 (×20): 800 mg via ORAL
  Filled 2021-07-27 (×20): qty 2

## 2021-07-27 MED ORDER — CHLORHEXIDINE GLUCONATE CLOTH 2 % EX PADS
6.0000 | MEDICATED_PAD | Freq: Every day | CUTANEOUS | Status: DC
  Administered 2021-07-28 – 2021-08-06 (×10): 6 via TOPICAL

## 2021-07-27 NOTE — Progress Notes (Signed)
?PROGRESS NOTE ? ? ?Brooke Beasley  HEN:[id number] DOB: Jun 07, 1930 DOA: 07/23/2021 ?PCP: [name], MD  ?Brief Narrative:  ? ?88 year old white female ?Multiple prior admissions for syncope with falls and fractures ?HFpEF EF 60-65% 11/22, HTN, anxiety, HLD, neuropathy NOS, recurrent C. difficile in the past Rx by infectious disease 2021 ?Chronic constipation internal hemorrhoids-previous banding by [facility] gastroenterology Dr. Marius Ditch ?--prior colonoscopy 08/2019 sigmoid diverticuli polyps rectal prolapse large external hemorrhoids ?Rectal prolapse status post robotic low anterior resection with rectopexy proctoscopy Dr. Johney Maine 05/26/2021 ?  ?Seen in office 07/13/2021 with 2 months of intermittent discomfort and low back pain worsening over the 2 weeks prior to seeing Dr. Danise Mina  ?2/2 right sciatica-given steroid taper scheduled Tylenol as needed hydrocodone ?Daughter at the bedside tells me she was having some bruising postprocedure by Dr. Ronald Lobo bruising however was on the right side where his low anterior resection was on the left-she had also been walking more frequently ?Also recent treatment 2 rounds of antibiotics Augmentin ?Because of unrelenting pain outpatient MRI performed showing phlegmon versus abscess L5-S1 with T1 abnormality in addition ? ?Sent to hospital for admission and found to be in new onset A-fib on evaluation ?BUN/creatinine 23/0.9,  ?CRP 5, ESR 48 (looks like had elevated ESR however previously in 2015) ?White count 12 platelet 350 predominant neutrophilia ?Blood culture X2--3/30 performed ? ?3/31-admit--MRI=Phlegmon 34m ,Disc aspiration=Enterococcus, CT abd/pelv--decreasing size seroma ?4/1-start Lovenox--adjust pain meds ?4/2-started endocarditis coverage with Unasyn-cardiology Dr. KDoylene Canardconsulted to coordinate TEE ?4/3-TEE negative for endocarditis antibiotics adjusted by ID ? ?Hospital-Problem based course ? ?Discitis osteomyelitis L5-S1, 4 mm epidural phlegmon ??   Endocarditis ruled out by TEE 4/3 ?Status post IR guided disc aspiration 3/31---fluid culture growing Enterococcus--neurosurgery Dr. DReatha Armour IR input appreciated ?Currently only Unasyn per Dr. WJuleen China NSL iv ?CT 06/26/2021-descreased seroma size ?MRI confirms Discitis, ECHO 4/1 no veg- ?TEE no vegetation ?Norco 08/27/2023 q4 prn, mild pain Tylenol--IV morphine on board 1 mg every 3 as needed severe pain--Cautious Naprosyn 375 twice daily added on 4/3 patient already on gabapentin 3 times daily ?NGTD blood culture 3/30---NGTD repeat cultures 07/24/2021  ?PICC line to be placed per Dr. WJuleen Chinaorder placed ?New onset A-fib-etiology probably secondary to infection ?CHA2DS2-VASc score=5 ?At least mild aortic valve stenosis and regurgitation on TEE ?Metoprolol 12.5 twice daily given heart rates low 100s (only has intolerance not allergy) ?Lovenox transitioned to Eliquis 4/3 ?Replace magnesium orally ?Subclinical Hypothyroid ?T4 is 0.77 with elevated TSH --> subclinical hypothyroidism-would not adjust thyroxine when acutely ill-recheck TFT  3 to 4 weeks  ?Continue 100 mcg dose ?Prior recurrent C. Difficile ?Prolonged prior vancomycin taper plus Zinplava 04/2019--- needs vancomycin 125 p.o. daily prophylaxis once antibiotics started ?CKD 3B ?Mild hyperkalemia now resolved ?Hold irbesartan , Lasix 20 Aldactone ?Neuropathy NOS ?Continue gabapentin 200 twice daily carefully ?Anxiety ?Klonopin 0.5 at bedtime ? ?DVT prophylaxis: SCD ?Code Status: DNR confirmed at bedside ?Family Communication:  ?Discussed with daughter RWells Guileson phone as well as daughter at bedside ?Disposition:  ?Status is: Observation ?The patient will require care spanning > 2 midnights and should be moved to inpatient because:  ? ?Sick and needs work-up--likely CIR? ? ?  ?Consultants:  ?Infectious disease ?Neurosurgery ? ?Procedures: Multiple ? ?Antimicrobials: None as yet ? ?Subjective: ? ?Pain 5/10-still difficulty sitting up in bed-explained natural history  of this disease process to patient/family-do not think she will have immediate pain relief for the next several days but it will get better gradually ?This was explained in detail ?Overall patient herself  appears better-she is moving a little bit more-no diarrhea nor fever today ? ?Objective: ?Vitals:  ? 07/27/21 0920 07/27/21 0935 07/27/21 1203 07/27/21 1600  ?BP: (!) 96/49 (!) 102/54 (!) 70/47 (!) 145/74  ?Pulse: 97 88 81 77  ?Resp:  _0 ?Temp:  98 ?F (36.7 ?C) (!) 97.4 ?F (36.3 ?C)   ?TempSrc:      ?SpO2:  97% (!) 89% 98%  ?Weight:      ?Height:      ? ? ?Intake/Output Summary (Last 24 hours) at 07/27/2021 1825 ?Last data filed at 07/27/2021 0907 ?Gross per 24 hour  ?Intake 200 ml  ?Output 1200 ml  ?Net -1000 ml  ? ? ?Filed Weights  ? 07/23/21 1326 07/25/21 0500 07/27/21 0809  ?Weight: 72.6 kg 70.3 kg 72.6 kg  ? ? ?Examination: ? ?Frail white female no distress moderate dentition ?Poor exam to posterolateral lung fields ?S1-S2 irregularly irregular?  Murmur ?Abdomen soft slightly tender in right lower quadrant ?Straight leg raise bilaterally is somewhat antalgic ?Rest of neuro is grossly intact example touch ETC ?Psych euthymic coherent ? ?Data Reviewed: personally reviewed  ? ?CBC ?   ?Component Value Date/Time  ? WBC 15.1 (H) 07/26/2021 0524  ? RBC 4.02 07/26/2021 0524  ? HGB 11.6 (L) 07/26/2021 0524  ? HGB 13.7 04/09/2021 1417  ? HCT 35.9 (L) 07/26/2021 0524  ? HCT 42.0 04/09/2021 1417  ? PLT 288 07/26/2021 0524  ? PLT 259 04/09/2021 1417  ? MCV 89.3 07/26/2021 0524  ? MCV 88 04/09/2021 1417  ? MCH 28.9 07/26/2021 0524  ? MCHC 32.3 07/26/2021 0524  ? RDW 17.7 (H) 07/26/2021 0524  ? RDW 16.2 (H) 04/09/2021 1417  ? LYMPHSABS 2.3 07/26/2021 0524  ? LYMPHSABS 1.8 03/27/2021 0935  ? MONOABS 1.8 (H) 07/26/2021 0524  ? EOSABS 0.3 07/26/2021 0524  ? EOSABS 0.5 (H) 03/27/2021 0935  ? BASOSABS 0.0 07/26/2021 0524  ? BASOSABS 0.1 03/27/2021 0935  ? ? ?  Latest Ref Rng & Units 07/27/2021  ?  3:37 AM 07/26/2021  ?  5:24 AM  07/25/2021  ?  5:44 AM  ?CMP  ?Glucose 70 - 99 mg/dL 124   123   121    ?BUN 8 - 23 mg/dL _1 ?Creatinine 0.44 - 1.00 mg/dL 0.77   0.94   1.05    ?Sodium 135 - 145 mmol/L 131   131   134    ?Potassium 3.5 - 5.1 mmol/L 3.8   4.2   5.3    ?Chloride 98 - 111 mmol/L 98   96   99    ?CO2 22 - 32 mmol/L _2 ?Calcium 8.9 - 10.3 mg/dL 8.2   8.5   9.4    ?Total Protein 6.5 - 8.1 g/dL 5.5    6.6    ?Total Bilirubin 0.3 - 1.2 mg/dL 0.1    0.6    ?Alkaline Phos 38 - 126 U/L 77    89    ?AST 15 - 41 U/L 17    23    ?ALT 0 - 44 U/L 13    17    ? ? ? ?Radiology Studies: ?No results found. ? ? ?Scheduled Meds: ? brimonidine  1 drop Both Eyes Daily  ? clonazePAM  0.5 mg Oral QHS  ? enoxaparin (LOVENOX) injection  70 mg Subcutaneous BID  ? gabapentin  300 mg  Oral TID  ? latanoprost  1 drop Both Eyes QHS  ? levothyroxine  100 mcg Oral Daily  ? metoprolol tartrate  12.5 mg Oral BID  ? naproxen  375 mg Oral BID WC  ? sertraline  100 mg Oral q AM  ? vancomycin  125 mg Oral QHS  ? ?Continuous Infusions: ? ampicillin-sulbactam (UNASYN) IV 3 g (07/27/21 1745)  ? ? ? LOS: 3 days  ? ?Time spent: 37 minutes including care coordination time ? ?Jai-Gurmukh Samtani, MD ?[facility] ?To contact the attending provider between 7A-7P or the covering provider during after hours 7P-7A, please log into the web site [email or web address] and access using universal [place] password for that web site. If you do not have the password, please call the hospital operator. ? ?07/27/2021, 6:25 PM  ? ? ?

## 2021-07-27 NOTE — Progress Notes (Signed)
Inpatient Rehab Admissions Coordinator:  ?Spoke with pt and daughter [name] at bedside.Pt and family have decided to pursue CIR. [name] confirmed pt will have 24/7 support after discharge. Will continue to follow medical work and pt's progress and tolerance of therapies. ? ? ? ?Gayland Curry, MS, CCC-SLP ?Admissions Coordinator ?[phone] ? ?

## 2021-07-27 NOTE — Progress Notes (Signed)
PT Cancellation Note ? ?Patient Details ?Name: Brooke Beasley ?MRN: [id number] ?DOB: [birth year] ? ? ?Cancelled Treatment:    Reason Eval/Treat Not Completed: Patient at procedure or test/unavailable; out of the room for TEE.  Will attempt again as schedule permits.  ? ? ?Reginia Naas ?07/27/2021, 9:07 AM ?Magda Kiel, PT ?Acute Rehabilitation Services ?VWPVX:[phone] ?[phone] ?07/27/2021 ? ?

## 2021-07-27 NOTE — Anesthesia Postprocedure Evaluation (Signed)
Anesthesia Post Note ? ?Patient: Brooke Beasley ? ?Procedure(s) Performed: TRANSESOPHAGEAL ECHOCARDIOGRAM (TEE) ?BUBBLE STUDY ? ?  ? ?Patient location during evaluation: PACU ?Anesthesia Type: MAC ?Level of consciousness: awake and alert ?Pain management: pain level controlled ?Vital Signs Assessment: post-procedure vital signs reviewed and stable ?Respiratory status: spontaneous breathing, nonlabored ventilation and respiratory function stable ?Cardiovascular status: blood pressure returned to baseline and stable ?Postop Assessment: no apparent nausea or vomiting ?Anesthetic complications: no ? ? ?No notable events documented. ? ?Last Vitals:  ?Vitals:  ? 07/27/21 0920 07/27/21 0935  ?BP: (!) 96/49 (!) 102/54  ?Pulse: 97 88  ?Resp:  18  ?Temp:  36.7 ?C  ?SpO2:  97%  ?  ?Last Pain:  ?Vitals:  ? 07/27/21 0935  ?TempSrc:   ?PainSc: 0-No pain  ? ? ?  ?  ?  ?  ?  ?  ? ?Lynda Rainwater ? ? ? ? ?

## 2021-07-27 NOTE — PMR Pre-admission (Shared)
PMR Admission Coordinator Pre-Admission Assessment ? ?Patient: Brooke Beasley is an 88 y.o., female ?MRN: [id number] ?DOB: 04-Aug-1930 ?Height: '5\' 1"'$  (154.9 cm) ?Weight: 72.6 kg ? ?Insurance Information ?HMO: ***    PPO: ***     PCP:      IPA:      80/20:      OTHER:  ?PRIMARY: [employer]      Policy#: [id number]      Subscriber: patient ?CM Name: ***      Phone#: ***     Fax#: *** ?Pre-Cert#: ***      Employer: *** ?Benefits:  Phone #: ***     Name: *** ?Eff. Date: ***     Deduct: ***      Out of Pocket Max: ***      Life Max: *** ?CIR: ***      SNF: *** ?Outpatient: ***     Co-Pay: *** ?Home Health: ***      Co-Pay: *** ?DME: ***     Co-Pay: *** ?Providers: in-network ?SECONDARY:       Policy#:      Phone#:  ? ?Financial Counselor:       Phone#:  ? ?The ?Data Collection Information Summary? for patients in Inpatient Rehabilitation Facilities with attached ?Privacy Act [place] Records? was provided and verbally reviewed with: {CHL IP Patient Family GG:[id number]} ? ?Emergency Contact Information ?Contact Information   ? ? Name Relation Home Work Mobile  ? Newlin,Rebecca Daughter [phone]   ? Moore,Janet Daughter   [phone]  ? ?  ? ? ?Current Medical History  ?Patient Admitting Diagnosis: lumbar discitis ?History of Present Illness: *** ?  ? ?Patient's medical [facility] has been reviewed by the rehabilitation admission coordinator and physician. ? ?Past Medical History  ?Past Medical History:  ?Diagnosis Date  ? Anxiety   ? C. difficile diarrhea 07/11/2018  ? S/p multiple oral vanc treatments (course and tapers) and completed Zinplava monoclonal Ab treatment (05/10/2019) Fecal transplant on hold during [place] pandemic  ? CAP (community acquired pneumonia) 12/22/2017  ? Carotid stenosis 10/18/2014  ? R 1-39%, L 40-59%, rpt 1 yr (09/2014)   ? CKD (chronic kidney disease) stage 3, GFR 30-59 ml/min (HCC) 08/02/2014  ? Closed fracture of right orbit  ([place]) 02/10/2021  ? Clostridioides difficile infection   ? hx 2020  ? COVID-19 virus infection 10/13/2020  ? Degenerative disc disease   ? LS  ? Depression   ? nervous breakdonw in 1964-out of work for a year  ? Diverticulosis   ? severe by colonoscopy  ? Family history of adverse reaction to anesthesia   ? n/v  ? GERD (gastroesophageal reflux disease)   ? Glaucoma   ? Heart murmur 08/2011  ? mitral regurge - on echo   ? History of hiatal hernia   ? History of shingles   ? HTN (hypertension)   ? Hyperlipidemia   ? Hypertension   ? Hypothyroidism   ? IBS 11/02/2006  ? Intestinal bacterial overgrowth   ? In small colon  ? Left shoulder pain 11/04/2014  ? Lower GI bleed 12/2020  ? thought diverticular complicated by ABLA with syncope and orbital fracture s/p hospitalization  ? Maxillary fracture ([place]) 04/08/2012  ? Osteoarthritis 2016  ? Jefm Bryant)  ? Osteoporosis   ? dexa 2011  ? Pseudogout 2016  ? shoulders ([place])  ? ? ?Has the patient had major surgery during 100 days prior to admission? Yes ? ?Family History   ?  family history includes Breast cancer in her cousin; Coronary artery disease in her brother; Diabetes in her brother and paternal grandfather. ? ?Current Medications ? ?Current Facility-Administered Medications:  ?  acetaminophen (TYLENOL) tablet 650 mg, 650 mg, Oral, Q6H PRN, 650 mg at 07/26/21 1730 **OR** acetaminophen (TYLENOL) suppository 650 mg, 650 mg, Rectal, Q6H PRN, Kristopher Oppenheim, DO ?  Ampicillin-Sulbactam (UNASYN) 3 g in sodium chloride 0.9 % 100 mL IVPB, 3 g, Intravenous, Q6H, Mignon Pine, DO, Last Rate: 200 mL/hr at 07/27/21 1227, 3 g at 07/27/21 1227 ?  brimonidine (ALPHAGAN) 0.15 % ophthalmic solution 1 drop, 1 drop, Both Eyes, Daily, Kristopher Oppenheim, DO, 1 drop at 07/27/21 1003 ?  clonazePAM (KLONOPIN) tablet 0.5 mg, 0.5 mg, Oral, QHS, Kristopher Oppenheim, DO, 0.5 mg at 07/26/21 2058 ?  enoxaparin (LOVENOX) injection 70 mg, 70 mg, Subcutaneous, BID, Pham, Minh Q, RPH-CPP, 70 mg at 07/27/21 1003 ?   gabapentin (NEURONTIN) capsule 300 mg, 300 mg, Oral, TID, Verlon Au, Jai-Gurmukh, MD ?  Gerhardt's butt cream, , Topical, PRN, Nita Sells, MD ?  HYDROcodone-acetaminophen (NORCO/VICODIN) 5-325 MG per tablet 1 tablet, 1 tablet, Oral, Q4H PRN, Nita Sells, MD, 1 tablet at 07/27/21 1003 ?  hydrOXYzine (ATARAX) tablet 10 mg, 10 mg, Oral, TID PRN, Nita Sells, MD, 10 mg at 07/26/21 2056 ?  latanoprost (XALATAN) 0.005 % ophthalmic solution 1 drop, 1 drop, Both Eyes, QHS, Kristopher Oppenheim, DO, 1 drop at 07/26/21 2059 ?  levothyroxine (SYNTHROID) tablet 100 mcg, 100 mcg, Oral, Daily, Kristopher Oppenheim, DO, 100 mcg at 07/27/21 1003 ?  metoprolol tartrate (LOPRESSOR) tablet 12.5 mg, 12.5 mg, Oral, BID, Samtani, Jai-Gurmukh, MD, 12.5 mg at 07/27/21 1002 ?  morphine (PF) 2 MG/ML injection 1 mg, 1 mg, Intravenous, Q3H PRN, Nita Sells, MD, 1 mg at 07/27/21 5638 ?  naproxen (NAPROSYN) tablet 375 mg, 375 mg, Oral, BID WC, Samtani, Jai-Gurmukh, MD ?  ondansetron (ZOFRAN) tablet 4 mg, 4 mg, Oral, Q6H PRN **OR** ondansetron (ZOFRAN) injection 4 mg, 4 mg, Intravenous, Q6H PRN, Kristopher Oppenheim, DO ?  sertraline (ZOLOFT) tablet 100 mg, 100 mg, Oral, q AM, Kristopher Oppenheim, DO, 100 mg at 07/27/21 1007 ?  vancomycin (VANCOCIN) capsule 125 mg, 125 mg, Oral, QHS, Mignon Pine, DO, 125 mg at 07/26/21 2200 ? ?Patients Current Diet:  ?Diet Order   ? ?       ?  Diet clear liquid Room service appropriate? Yes; Fluid consistency: Thin  Diet effective now       ?  ? ?  ?  ? ?  ? ? ?Precautions / Restrictions ?Precautions ?Precautions: Fall, Back ?Precaution Comments: back precautions for comfort ?Restrictions ?Weight Bearing Restrictions: No  ? ?Has the patient had 2 or more falls or a fall with injury in the past year? Yes ? ?Prior Activity Level ?Limited Community (1-2x/wk): gets out of house for MD appointments ? ?Prior Functional Level ?Self Care: Did the patient need help bathing, dressing, using the toilet or eating? Needed  some help ? ?Indoor Mobility: Did the patient need assistance with walking from room to room (with or without device)? Independent ? ?Stairs: Did the patient need assistance with internal or external stairs (with or without device)? Independent ? ?Functional Cognition: Did the patient need help planning regular tasks such as shopping or remembering to take medications? Independent ? ?Patient Information ?  ? ?Patient's Response To:  ?  ? ?Home Assistive Devices / Equipment ?Home Equipment: Shower seat, Grab bars - tub/shower, Transport chair, Rolling  Walker (2 wheels), Rollator (4 wheels), Cane - quad, Other (comment), BSC/3in1, Grab bars - toilet (hurrycane; lift chair; adjustable bed) ? ?Prior Device Use: Indicate devices/aids used by the patient prior to current illness, exacerbation or injury? Walker and transport chair ? ?Current Functional Level ?Cognition ? Overall Cognitive Status: Within Functional Limits for tasks assessed ?Orientation Level: Oriented X4 ?General Comments: Distracted by pain often, but otherwise [facility] for tasks assessed. ?   ?Extremity Assessment ?(includes Sensation/Coordination) ? Upper Extremity Assessment: Defer to OT evaluation  ?Lower Extremity Assessment: RLE deficits/detail ?RLE Deficits / Details: Very slight weakness grossly compared to L; numbness/tingling noted in R foot ?RLE Sensation: decreased light touch (at foot)  ?  ?ADLs ?    ?  ?Mobility ? Overal bed mobility: Needs Assistance ?Bed Mobility: Rolling, Sidelying to Sit, Sit to Supine ?Rolling: Min guard ?Sidelying to sit: Mod assist, HOB elevated ?Sit to supine: Mod assist ?General bed mobility comments: Cues to log roll for comfort, min guard using bed rail. ModA to manage trunk to come to sit. ModA to manage legs and trunk with return to supine.  ?  ?Transfers ? Overall transfer level: Needs assistance ?Equipment used: 1 person hand held assist ?Transfers: Sit to/from Stand ?Sit to Stand: Min assist ?General transfer  comment: MinA to steady with transfer to stand from EOB, posterior sway noted.  ?  ?Ambulation / Gait / Stairs / Wheelchair Mobility ? Ambulation/Gait ?Ambulation/Gait assistance: Min assist ?Gait Distance (Feet):

## 2021-07-27 NOTE — Progress Notes (Signed)
Peripherally Inserted Central Catheter Placement ? ?The IV Nurse has discussed with the patient and/or persons authorized to consent for the patient, the purpose of this procedure and the potential benefits and risks involved with this procedure.  The benefits include less needle sticks, lab draws from the catheter, and the patient may be discharged home with the catheter. Risks include, but not limited to, infection, bleeding, blood clot (thrombus formation), and puncture of an artery; nerve damage and irregular heartbeat and possibility to perform a PICC exchange if needed/ordered by physician.  Alternatives to this procedure were also discussed.  Bard Power PICC patient education guide, fact sheet on infection prevention and patient information card has been provided to patient /or left at bedside.   ? ?PICC Placement Documentation  ?PICC Single Lumen 78/24/23 Right Basilic 36 cm 0 cm (Active)  ?Indication for Insertion or Continuance of Line Prolonged intravenous therapies;Home intravenous therapies (PICC only) 07/27/21 2243  ?Exposed Catheter (cm) 0 cm 07/27/21 2243  ?Site Assessment Clean;Dry;Intact 07/27/21 2243  ?Line Status Flushed;Saline locked;Blood return noted 07/27/21 2243  ?Dressing Type Securing device;Transparent 07/27/21 2243  ?Dressing Status Antimicrobial disc in place 07/27/21 2243  ?Safety Lock Not Applicable 53/61/44 3154  ?Line Care Connections checked and tightened 07/27/21 2243  ?Dressing Intervention New dressing 07/27/21 2243  ?Dressing Change Due 08/03/21 07/27/21 2243  ? ? ? ? ? ?Saphira Lahmann, Nicolette Bang ?07/27/2021, 10:43 PM ? ?

## 2021-07-27 NOTE — Progress Notes (Signed)
?   ? ?[facility] for Infectious Disease ? ?Date of Admission:  07/23/2021    ?       ?Reason for visit: Follow up on discitis/OM ? ?Current antibiotics: ?Ampicillin sulbactam 4/2--present ?Ceftriaxone 3/31-present ?Vancomycin p.o. 3/31-present ? ?Previous antibiotics: ?Daptomycin 3/31-4/2 ? ?ASSESSMENT:   ? ?88 y.o. female admitted with: ? ?Discitis/osteomyelitis of L5-S1: Infection initially noted on MRI without contrast 07/23/2021 after several weeks of worsening back pain.  Follow-up MRI with contrast 3/31 also noted a 4 mm epidural phlegmon without drainable component.  Status post IR disc space aspiration 3/31 with cultures growing Enterococcus faecalis.  TEE today was negative for endocarditis. ?Intra-abdominal fluid collections: Repeat CT scan 3/31 noted nearly resolved loculated fluid collections consistent with improving postoperative seromas or resolving abscesses. ?History of recurrent C. difficile: Started on oral vancomycin prophylaxis. ?New onset atrial fibrillation: In the setting of known valvular heart disease. ?CKD 3: Stable issue. ?Recent surgery for rectal prolapse: Completed 05/26/2021. ? ?RECOMMENDATIONS:   ? ?Continue ampicillin-sulbactam ?Discontinue ceftriaxone now that endocarditis has been ruled out ?Pain control ?PICC line ?Will follow ? ? ?Principal Problem: ?  Lumbar discitis ?Active Problems: ?  Hypothyroidism ?  HLD (hyperlipidemia) ?  Hypertension, essential ?  Valvular heart disease ?  Stage 3a chronic kidney disease (CKD) ([place]) ?  Right-sided low back pain with right-sided sciatica ?  New onset a-fib [facility]) ?  Disc disorder ? ? ? ?MEDICATIONS:   ? ?Scheduled Meds: ? brimonidine  1 drop Both Eyes Daily  ? clonazePAM  0.5 mg Oral QHS  ? enoxaparin (LOVENOX) injection  70 mg Subcutaneous BID  ? gabapentin  300 mg Oral TID  ? latanoprost  1 drop Both Eyes QHS  ? levothyroxine  100 mcg Oral Daily  ? metoprolol tartrate  12.5 mg Oral BID  ? naproxen  375 mg Oral BID WC  ? sertraline   100 mg Oral q AM  ? vancomycin  125 mg Oral QHS  ? ?Continuous Infusions: ? ampicillin-sulbactam (UNASYN) IV 3 g (07/27/21 0601)  ? ?PRN Meds:.acetaminophen **OR** acetaminophen, Gerhardt's butt cream, HYDROcodone-acetaminophen, hydrOXYzine, morphine injection, ondansetron **OR** ondansetron (ZOFRAN) IV ? ?SUBJECTIVE:  ? ?24 hour events:  ?No acute events noted overnight ?Afebrile, Tmax 99.4 ?Disc aspirate cultures with pansensitive Enterococcus ?Blood cultures remain negative ?Renal function improving ?TEE today was negative for endocarditis ? ?Patient reports continued back pain.  Primary team is adjusting her pain medications.  Discussed with patient and daughter over the phone plan for long-term antibiotics.  She continues to report hot flashes. ? ?Review of Systems  ?All other systems reviewed and are negative. ? ?  ?OBJECTIVE:  ? ?Blood pressure (!) 102/54, pulse 88, temperature 98 ?F (36.7 ?C), resp. rate 18, height '5\' 1"'$  (1.549 m), weight 72.6 kg, SpO2 97 %. ?Body mass index is 30.23 kg/m?. ? ?Physical Exam ?Constitutional:   ?   General: She is not in acute distress. ?   Appearance: Normal appearance.  ?HENT:  ?   Head: Normocephalic and atraumatic.  ?Eyes:  ?   Extraocular Movements: Extraocular movements intact.  ?   Conjunctiva/sclera: Conjunctivae normal.  ?Pulmonary:  ?   Effort: Pulmonary effort is normal. No respiratory distress.  ?Abdominal:  ?   Palpations: Abdomen is soft.  ?   Tenderness: There is no abdominal tenderness.  ?Musculoskeletal:     ?   General: Normal range of motion.  ?   Cervical back: Normal range of motion and neck supple.  ?Skin: ?  General: Skin is warm and dry.  ?Neurological:  ?   General: No focal deficit present.  ?   Mental Status: She is alert and oriented to person, place, and time.  ?Psychiatric:     ?   Mood and Affect: Mood normal.     ?   Behavior: Behavior normal.  ? ? ? ?Lab Results: ?Lab Results  ?Component Value Date  ? WBC 15.1 (H) 07/26/2021  ? HGB 11.6 (L)  07/26/2021  ? HCT 35.9 (L) 07/26/2021  ? MCV 89.3 07/26/2021  ? PLT 288 07/26/2021  ?  ?Lab Results  ?Component Value Date  ? NA 131 (L) 07/27/2021  ? K 3.8 07/27/2021  ? CO2 24 07/27/2021  ? GLUCOSE 124 (H) 07/27/2021  ? BUN 12 07/27/2021  ? CREATININE 0.77 07/27/2021  ? CALCIUM 8.2 (L) 07/27/2021  ? GFRNONAA >60 07/27/2021  ? GFRAA 58 (L) 03/28/2019  ?  ?Lab Results  ?Component Value Date  ? ALT 13 07/27/2021  ? AST 17 07/27/2021  ? ALKPHOS 77 07/27/2021  ? BILITOT 0.1 (L) 07/27/2021  ? ? ?   ?Component Value Date/Time  ? CRP 5.0 (H) 07/23/2021 1555  ? ? ?   ?Component Value Date/Time  ? ESRSEDRATE 48 (H) 07/23/2021 1555  ? ?  ?I have reviewed the micro and lab results in Epic. ? ?Imaging: ?No results found.  ? ?Imaging independently reviewed in Epic.  ? ? ?Mignon Pine ?[facility]ious [facility] ?[place] ?[phone] pager ?07/27/2021, 12:00 PM ? ?I have personally spent 50 minutes involved in face-to-face and non-face-to-face activities for this patient on the day of the visit. Professional time spent includes the following activities: Preparing to see the patient (review of tests), Obtaining and/or reviewing separately obtained history (admission/discharge record), Performing a medically appropriate examination and/or evaluation , Ordering medications/tests/procedures, referring and communicating with other health care professionals, Documenting clinical information in the EMR, Independently interpreting results (not separately reported), Communicating results to the patient/family/caregiver, Counseling and educating the patient/family/caregiver and Care coordination (not separately reported).  ? ? ?

## 2021-07-27 NOTE — Progress Notes (Signed)
OT Cancellation Note ? ?Patient Details ?Name: Brooke Beasley ?MRN: [id number] ?DOB: 03-31-1931 ? ? ?Cancelled Treatment:    Reason Eval/Treat Not Completed: Patient at procedure or test/ unavailable (OT to continue evaluation attempt once back on the unit) ? ?Brooke Beasley ?07/27/2021, 8:11 AM ?

## 2021-07-27 NOTE — Progress Notes (Signed)
?  Echocardiogram ?Echocardiogram Transesophageal has been performed. ? ?Brooke Beasley ?07/27/2021, 9:31 AM ?

## 2021-07-27 NOTE — Progress Notes (Signed)
Physical Therapy Treatment ?Patient Details ?Name: Brooke Beasley ?MRN: [id number] ?DOB: [birth year] ?Today's Date: 07/27/2021 ? ? ?History of Present Illness Pt is an 88 y.o. female who presented 07/23/21 with a 2 month hx of increasing low back pain with radiation down her R leg. Per chart, her symptoms began after she underwent rectal prolapse surgery in January of 2023. MRI was concerning for L5-S1 discitis/osteomyelitis. S/p L5-S1 disc aspiration 3/31. PMH: carotid stenosis, CKD, diverticulosis, glaucoma, heart murmur, HTN, HLD, hypothyroidism, pseudogout, osteoporosis ? ?  ?PT Comments  ? ? Patient limited by pain in sitting and standing with numbness in R foot in upright positions.  Slight improvement with piriformis stretch in sitting.  She was able to transfer to [facility] with RW and sit long enough for assist for sponge bath with PT and daughters assisting.  She stood several minutes for perineal hygiene with min A for balance using RW.  She will needs continued skilled PT in the acute setting to progress tolerance and allow acute inpatient rehab stay prior to d/c home.   ?Recommendations for follow up therapy are one component of a multi-disciplinary discharge planning process, led by the attending physician.  Recommendations may be updated based on patient status, additional functional criteria and insurance authorization. ? ?Follow Up Recommendations ? Acute inpatient rehab (3hours/day) (pending progress) ?  ?  ?Assistance Recommended at Discharge Intermittent Supervision/Assistance  ?Patient can return home with the following A lot of help with walking and/or transfers;Direct supervision/assist for medications management;Assistance with cooking/housework;A lot of help with bathing/dressing/bathroom;Assist for transportation;Help with stairs or ramp for entrance ?  ?Equipment Recommendations ? None recommended by PT  ?  ?Recommendations for Other Services   ? ? ?  ?Precautions / Restrictions  Precautions ?Precautions: Fall;Back ?Precaution Comments: back precautions for comfort ?Restrictions ?Weight Bearing Restrictions: No  ?  ? ?Mobility ? Bed Mobility ?Overal bed mobility: Needs Assistance ?Bed Mobility: Rolling, Sidelying to Sit, Sit to Sidelying ?Rolling: Min guard ?Sidelying to sit: Mod assist ?  ?  ?Sit to sidelying: Min assist ?General bed mobility comments: cues for technique, assist for trunk upright and guiding legs, to side assist for guiding legs and cues ?  ? ?Transfers ?  ?Equipment used: Rolling walker (2 wheels) ?Transfers: Sit to/from Stand, Bed to chair/wheelchair/BSC ?Sit to Stand: Min assist, +2 safety/equipment ?  ?Step pivot transfers: Min assist ?  ?  ?  ?General transfer comment: assist for balance as stepping to [facility], then back to bed ?  ? ?Ambulation/Gait ?  ?  ?  ?  ?  ?  ?  ?  ? ? ?Stairs ?  ?  ?  ?  ?  ? ? ?Wheelchair Mobility ?  ? ?Modified Rankin (Stroke Patients Only) ?  ? ? ?  ?Balance Overall balance assessment: Needs assistance ?Sitting-balance support: Bilateral upper extremity supported ?Sitting balance-Leahy Scale: Poor ?Sitting balance - Comments: UE support due to back pain and R LE numbness in sitting ?  ?Standing balance support: Bilateral upper extremity supported ?Standing balance-Leahy Scale: Poor ?Standing balance comment: minguard with UE support during perineal hygiene after toileting ?  ?  ?  ?  ?  ?  ?  ?  ?  ?  ?  ?  ? ?  ?Cognition Arousal/Alertness: Awake/alert ?Behavior During Therapy: Anxious ?Overall Cognitive Status: Within Functional Limits for tasks assessed ?  ?  ?  ?  ?  ?  ?  ?  ?  ?  ?  ?  ?  ?  ?  ?  ?  ?  ?  ? ?  ?  Exercises   ? ?  ?General Comments General comments (skin integrity, edema, etc.): two daughters in the room supporting and assisting with sponge bath while on BSC and in standing after toileting; BP stable 96 systolic supine, 595 seated and 120's after return to supine ?  ?  ? ?Pertinent Vitals/Pain Pain Assessment ?Faces Pain  Scale: Hurts worst ?Pain Location: back, R leg ?Pain Descriptors / Indicators: Discomfort, Grimacing, Guarding, Tingling ?Pain Intervention(s): Limited activity within patient's tolerance, Monitored during session, Repositioned  ? ? ?Home Living Family/patient expects to be discharged to:: Private residence ?Living Arrangements: Children ?Available Help at Discharge: Family;Available 24 hours/day (family has hired a Actuary) ?Type of Home: House ?Home Access: Stairs to enter ?Entrance Stairs-Rails: Can reach both ?Entrance Stairs-Number of Steps: 2 ?  ?Home Layout: One level ?Home Equipment: Shower seat;Grab bars - tub/shower;Transport Oncologist (2 wheels);Rollator (4 wheels);Cane - quad;Other (comment);BSC/3in1;Grab bars - toilet ?Additional Comments: Pts daughter & son in law lives with her  ?  ?Prior Function    ?  ?  ?   ? ?PT Goals (current goals can now be found in the care plan section) Progress towards PT goals: Progressing toward goals (slowly) ? ?  ?Frequency ? ? ? Min 3X/week ? ? ? ?  ?PT Plan Current plan remains appropriate  ? ? ?Co-evaluation   ?  ?  ?  ?  ? ?  ?AM-PAC PT "6 Clicks" Mobility   ?Outcome Measure ? Help needed turning from your back to your side while in a flat bed without using bedrails?: A Little ?Help needed moving from lying on your back to sitting on the side of a flat bed without using bedrails?: A Lot ?Help needed moving to and from a bed to a chair (including a wheelchair)?: A Lot ?Help needed standing up from a chair using your arms (e.g., wheelchair or bedside chair)?: A Little ?Help needed to walk in hospital room?: Total ?Help needed climbing 3-5 steps with a railing? : Total ?6 Click Score: 12 ? ?  ?End of Session Equipment Utilized During Treatment: Gait belt ?Activity Tolerance: Patient limited by pain ?Patient left: in bed;with call bell/phone within reach;with family/visitor present ?  ?PT Visit Diagnosis: Unsteadiness on feet (R26.81);Other abnormalities of  gait and mobility (R26.89);Muscle weakness (generalized) (M62.81);Difficulty in walking, not elsewhere classified (R26.2);Pain ?Pain - part of body:  (back) ?  ? ? ?Time: 6387-5643 ?PT Time Calculation (min) (ACUTE ONLY): 45 min ? ?Charges:  $Therapeutic Activity: 23-37 mins ?$Self Care/Home Management: 8-22          ?          ? ?Magda Kiel, PT ?Acute Rehabilitation Services ?PIRJJ:[phone] ?Office:[phone] ?07/27/2021 ? ? ? ?Reginia Naas ?07/27/2021, 4:33 PM ? ?

## 2021-07-27 NOTE — Progress Notes (Addendum)
Family wanted patient to get vicodin every 4 hours to stay on schedule last night. However, she was put on NPO around 11pm without an order for sips with meds. The patient was sound asleep every time the nurse tech or I entered the room. We did not bother her for pain medications last night. I sent doctor a message this morning about sips with meds and he said that was fine but I had already given the morphine before he responded. I spoke with charge nurse and she agreed that we should just give IV medications before this procedure. Patient is also spitting up mucous plugs this morning. I set up suction at bed side and gave her mouth wash.   ?

## 2021-07-27 NOTE — CV Procedure (Signed)
INDICATIONS:   The patient is 88 years old female with lumbar discitis.has bacteremia. ? ?PROCEDURE:  Informed consent was discussed including risks, benefits and alternatives for the procedure.  Risks include, but are not limited to, cough, sore throat, vomiting, nausea, somnolence, esophageal and stomach trauma or perforation, bleeding, low blood pressure, aspiration, pneumonia, infection, trauma to the teeth and death.   ? ?Patient was given sedation.  The oropharynx was anesthetized with topical lidocaine.  The transesophageal probe was inserted in the esophagus and stomach and multiple views were obtained.  Agitated saline was used after the transesophageal probe was removed from the body.  The patient was kept under observation until the patient left the procedure room.  The patient left the procedure room in stable condition.  ? ?COMPLICATIONS:  There were no immediate complications. ? ?FINDINGS: ? ?LEFT VENTRICLE: The left ventricle is normal in structure and function.  Wall motion is normal.  No thrombus or masses seen in the left ventricle. ? ?RIGHT VENTRICLE:  The right ventricle is normal in structure and function without any thrombus or masses.   ? ?LEFT ATRIUM:  The left atrium is normal without any thrombus or masses. ? ?LEFT ATRIAL APPENDAGE:  The left atrial appendage is free of any thrombus or masses. ? ?RIGHT ATRIUM:  The right atrium is free of any thrombus or masses.   ? ?ATRIAL SEPTUM:  The atrial septum is normal without any ASD or PFO as checked by sonicated saline injection. ? ?MITRAL VALVE:  The mitral valve is normal in structure and function with multi-jet moderate regurgitation, no masses, stenosis or vegetations. ? ?TRICUSPID VALVE:  The tricuspid valve is normal in structure and function with mild regurgitation, no masses, stenosis or vegetations. ? ?AORTIC VALVE:  The aortic valve is severely calcified and thickened and has at least mild stenosis and trivial regurgitation, no masses,  stenosis or vegetations. ? ?PULMONIC VALVE:  The pulmonic valve is normal in structure and function with trivial regurgitation, no masses, stenosis or vegetations. ? ?AORTIC ARCH, ASCENDING AND DESCENDING AORTA:  The aorta had significant atherosclerosis in the ascending or descending aorta.  The aortic arch was normal. ? ? Superior Vena Cava : No thrombus or catheter. ? ? Pulmonary Veins: Visible. ? ? Pulmonary artery: visible and normal. ? ? ?IMPRESSION:   ?Normal LV systolic function. ?At least mild aortic valve stenosis and regurgitation. ?Moderate MR ?Mild TR ?Negative PFO. ? ?RECOMMENDATIONS:    ?Medical therapy. ?No vegetations seen.Marland Kitchen   ?

## 2021-07-27 NOTE — Anesthesia Procedure Notes (Signed)
Procedure Name: Wilder ?Date/Time: 07/27/2021 8:58 AM ?Performed by: Lieutenant Diego, CRNA ?Pre-anesthesia Checklist: Patient identified, Emergency Drugs available, Suction available, Patient being monitored and Timeout performed ?Patient Re-evaluated:Patient Re-evaluated prior to induction ?Oxygen Delivery Method: Simple face mask ?Preoxygenation: Pre-oxygenation with 100% oxygen ?Induction Type: IV induction ? ? ? ? ?

## 2021-07-27 NOTE — Progress Notes (Addendum)
? ?Brooke Beasley ?[id number] ?Sep 27, 1930 ? ?CARE TEAM: ? ?PCP: [name], MD ? ?Outpatient Care Team: Patient Care Team: ?[name], MD as PCP - General (Family Medicine) ?Kate Sable, MD as PCP - Cardiology (Cardiology) ?Dannielle Karvonen, RN as [place] Management ?Michael Boston, MD as Consulting Physician (General Surgery) ?Lin Landsman, MD as Consulting Physician (Gastroenterology) ?Kate Sable, MD as Consulting Physician (Cardiology) ? ?Inpatient Treatment Team: Treatment Team: Attending Provider: Nita Sells, MD; Rounding Team: Billee Cashing Infectious, Rounding, MD; Rounding Team: Minerva Ends, MD; Nurse Practitioner: Marvis Moeller, NP; Consulting Physician: Dixie Dials, MD; Physical Therapist: Max Sane, PT; Occupational Therapist: Aundra Millet, OT; Utilization Review: Lacretia Leigh, RN; Pharmacist: Kaleen Mask, [facility]; Registered Nurse: Cloretta Ned, RN; Consulting Physician: Michael Boston, MD ? ? ?Problem List:  ? ?Principal Problem: ?  Lumbar discitis ?Active Problems: ?  Hypothyroidism ?  HLD (hyperlipidemia) ?  Hypertension, essential ?  Valvular heart disease ?  Stage 3a chronic kidney disease (CKD) ([place]) ?  Right-sided low back pain with right-sided sciatica ?  New onset a-fib [facility]) ?  Disc disorder ? ? ?Day of Surgery  05/26/2021 ? ?PPOST-OPERATIVE DIAGNOSIS: RECTAL PROLAPSE ?  ?PROCEDURE:  ?-ROBOTIC LOW ANTERIOR RECTOSIGMOID RESECTION ?-RECTOPEXY ?-INTRAOPERATIVE ASSESSMENT OF TISSUE VASCULAR PERFUSION USING ICG (indocyanine green) IMMUNOFLUORESCENCE ?-TRANSVERSUS ABDOMINIS PLANE (TAP) BLOCK - BILATERAL ?-RIGID PROCTOSCOPY ?-ANORECTAL EUA ?  ?SURGEON:  Adin Hector, MD ? ?OR FINDINGS: Very redundant sigmoid colon.  Circumferential prolapse of distal and mid rectum circumferentially with irritated hemorrhoids.  About 45 cm redundant colon resected from mid descending to proximal rectum.  Suture rectopexy done.   Patient has a 60 EEA end mid descending colon-to-end mid rectum stapled anastomosis.   At rest 10 cm from the anal verge.  Irritated internal hemorrhoids but grade 2 after prolapse corrected.  Held off on more aggressive resection/ligation/pexy.  Suspect hemorrhoid bleeding from chronic prolapse irritation. ?  ? ?Assessment ? ?Readmission for severe low back pain with sciatica. ? ?Over 2 months status post resection rectopexy. ? ?(Hospital Stay = 3 days) ? ?Plan: ? ?Patient not in room but her daughter is.  Patient apparently downstairs getting TEE.   ? ?Found out about admission by copied note from admitting service.   ? ?Films were reviewed.  Her postoperative hematoma/seromas the anterior and lower posterior pelvis have nearly resolved which would be expected at this point. ? ?The concern of L5/S1 discitis is probably where I placed sutures for the rectopexy part of the surgery which is standard. ? ?While I am skeptical that she has any major infection going on, apparently she grew Enterococcus in fluid sample.  Makes sense to place on IV antibiotics. ? ?Seems atypical that L5-S1 irritation would be causing sciatic radicular type pain.  Will defer to neurosurgery/neurology on that. ? ?There is no evidence of any leak or perforation or abscess or ischemia, arguing against any major colorectal catastrophe.  She is past the 6-week window where I worry about such an infection. ? ?Continue work-up to rule out other sources or foci of infection per primary service and ID. ? ?Solid diet as tolerated from my standpoint. ? ?High-fiber bowel regimen to help. ? ?We will follow-up later in the admission after work-up continues ? ? ? ?07/27/2021 ? ? ? ?Subjective: ?(Chief complaint) ? ?Found out about admission through copy note of admitting service. ? ?Patient down getting TEE ? ?Daughter in room.  ? ?Objective: ? ?Vital signs: ? ?Vitals:  ?  07/26/21 2308 07/27/21 0304 07/27/21 2979 07/27/21 0809  ?BP: 129/64 136/76 130/79  (!) 127/56  ?Pulse: 80 80 89 94  ?Resp: '19 20 20 15  '$ ?Temp: 98.9 ?F (37.2 ?C) 98.6 ?F (37 ?C) 99.4 ?F (37.4 ?C) 99.1 ?F (37.3 ?C)  ?TempSrc: Oral Oral  Tympanic  ?SpO2: 91% 94% 96% 96%  ?Weight:    72.6 kg  ?Height:    '5\' 1"'$  (1.549 m)  ? ? ?Last BM Date : 07/25/21 ? ?Intake/Output  ? ?Yesterday: ? 04/02 0701 - 04/03 0700 ?In: 120.2 [IV Piggyback:120.2] ?Out: 1200 [Urine:1200] ?This shift: ? No intake/output data recorded. ? ?Bowel function: ? Flatus: YES ? BM:  YES ? Drain: (No drain) ? ? ?Physical Exam: ? ?N/a ? ? ?Results:  ? ?Cultures: ?Recent Results (from the past 720 hour(s))  ?MRSA Next Gen by PCR, Nasal     Status: None  ? Collection Time: 07/23/21  2:06 PM  ? Specimen: Nasal Mucosa; Nasal Swab  ?Result Value Ref Range Status  ? MRSA by PCR Next Gen NOT DETECTED NOT DETECTED Final  ?  Comment: (NOTE) ?The GeneXpert MRSA Assay (FDA approved for NASAL specimens only), ?is one component of a comprehensive MRSA colonization surveillance ?program. It is not intended to diagnose MRSA infection nor to guide ?or monitor treatment for MRSA infections. ?Test performance is not FDA approved in patients less than 2 years ?old. ?Performed at [facility], [place] [address], [place], [place] ?[address] ?  ?Culture, blood (routine x 2)     Status: None (Preliminary result)  ? Collection Time: 07/23/21  3:57 PM  ? Specimen: BLOOD  ?Result Value Ref Range Status  ? Specimen Description   Final  ?  BLOOD BLOOD RIGHT FOREARM ?Performed at [facility], [address] [place], [place] [address] ?  ? Special Requests   Final  ?  BOTTLES DRAWN AEROBIC AND ANAEROBIC Blood Culture adequate volume ?Performed at [facility], [address] [place], [place] [address] ?  ? Culture   Final  ?  NO GROWTH 4 DAYS ?Performed at [facility], [place] [address], [place], [place] [address] ?  ? Report Status PENDING  Incomplete  ?Culture, blood (routine x 2)     Status: None (Preliminary result)   ? Collection Time: 07/23/21  6:27 PM  ? Specimen: BLOOD  ?Result Value Ref Range Status  ? Specimen Description   Final  ?  BLOOD RIGHT ANTECUBITAL ?Performed at [facility], [address] [place], [place] [address] ?  ? Special Requests   Final  ?  Blood Culture adequate volume BOTTLES DRAWN AEROBIC AND ANAEROBIC ?Performed at [facility], [address] [place], [place] [address] ?  ? Culture   Final  ?  NO GROWTH 4 DAYS ?Performed at [facility], [place] [address], [place], [place] [address] ?  ? Report Status PENDING  Incomplete  ?Culture, blood (routine x 2)     Status: None (Preliminary result)  ? Collection Time: 07/24/21 11:05 AM  ? Specimen: BLOOD  ?Result Value Ref Range Status  ? Specimen Description BLOOD LEFT ANTECUBITAL  Final  ? Special Requests   Final  ?  BOTTLES DRAWN AEROBIC AND ANAEROBIC Blood Culture adequate volume  ? Culture   Final  ?  NO GROWTH 3 DAYS ?Performed at [facility], [place] [address] [place], [place] [address] ?  ? Report Status PENDING  Incomplete  ?Culture,  blood (routine x 2)     Status: None (Preliminary result)  ? Collection Time: 07/24/21 11:15 AM  ? Specimen: BLOOD RIGHT HAND  ?Result Value Ref Range Status  ? Specimen Description BLOOD RIGHT HAND  Final  ? Special Requests   Final  ?  BOTTLES DRAWN AEROBIC AND ANAEROBIC Blood Culture adequate volume  ? Culture   Final  ?  NO GROWTH 3 DAYS ?Performed at [facility], [place] [address], [place], [place] [address] ?  ? Report Status PENDING  Incomplete  ?Body fluid culture w Gram Stain     Status: None (Preliminary result)  ? Collection Time: 07/24/21  4:05 PM  ? Specimen: Body Fluid  ?Result Value Ref Range Status  ? Specimen Description FLUID  Final  ? Special Requests INTERVERTEBRAL DISC  Final  ? Gram Stain NO WBC SEEN ?NO ORGANISMS SEEN ?  Final  ? Culture   Final  ?  RARE ENTEROCOCCUS FAECALIS ?SUSCEPTIBILITIES TO FOLLOW ?CRITICAL RESULT CALLED TO, READ BACK BY  AND VERIFIED WITH: RN SH.OWEN AT 1300 ON 07/25/2021 BY T.SAAD. ?Performed at [facility], [place] [address], [place], [place] [address] ?  ? Report Status PENDING  Incomplete  ? ? ?Labs: ?Results

## 2021-07-27 NOTE — Interval H&P Note (Signed)
History and Physical Interval Note: ? ?07/27/2021 ?8:45 AM ? ?Brooke Beasley  has presented today for surgery, with the diagnosis of endocarditis.  The various methods of treatment have been discussed with the patient and family. After consideration of risks, benefits and other options for treatment, the patient has consented to  Procedure(s): ?TRANSESOPHAGEAL ECHOCARDIOGRAM (TEE) (N/A) as a surgical intervention.  The patient's history has been reviewed, patient examined, no change in status, stable for surgery.  I have reviewed the patient's chart and labs.  Questions were answered to the patient's satisfaction.   ? ? ?[name] ? ? ?

## 2021-07-27 NOTE — Anesthesia Preprocedure Evaluation (Signed)
Anesthesia Evaluation  ?Patient identified by MRN, date of birth, ID band ?Patient awake ? ? ? ?Reviewed: ?Allergy & Precautions, H&P , NPO status , Patient's Chart, lab work & pertinent test results ? ?Airway ?Mallampati: II ? ?TM Distance: >3 FB ?Neck ROM: Full ? ? ? Dental ?no notable dental hx. ?(+) Edentulous Upper, Edentulous Lower, Dental Advisory Given ?  ?Pulmonary ?asthma ,  ?  ?Pulmonary exam normal ?breath sounds clear to auscultation ? ? ? ? ? ? Cardiovascular ?hypertension, Pt. on medications ?negative cardio ROS ? ?MR  ?Rhythm:Regular Rate:Normal ? ? ?  ?Neuro/Psych ?Anxiety Depression negative neurological ROS ? negative psych ROS  ? GI/Hepatic ?negative GI ROS, Neg liver ROS, GERD  Medicated,  ?Endo/Other  ?Hypothyroidism  ? Renal/GU ?Renal Insufficiencynegative Renal ROS  ?negative genitourinary ?  ?Musculoskeletal ?negative musculoskeletal ROS ?(+) Osteoarthritis,   ? Abdominal ?  ?Peds ?negative pediatric ROS ?(+)  Hematology ?negative hematology ROS ?(+) anemia ,   ?Anesthesia Other Findings ? ? Reproductive/Obstetrics ?negative OB ROS ? ?  ? ? ? ? ? ? ? ? ? ? ? ? ? ?  ?  ? ? ? ? ? ? ? ? ?Anesthesia Physical ? ?Anesthesia Plan ? ?ASA: 3 ? ?Anesthesia Plan: MAC  ? ?Post-op Pain Management:   ? ?Induction: Intravenous ? ?PONV Risk Score and Plan: 2 and Ondansetron, Treatment may vary due to age or medical condition and Midazolam ? ?Airway Management Planned: Nasal Cannula ? ?Additional Equipment:  ? ?Intra-op Plan:  ? ?Post-operative Plan:  ? ?Informed Consent: I have reviewed the patients History and Physical, chart, labs and discussed the procedure including the risks, benefits and alternatives for the proposed anesthesia with the patient or authorized representative who has indicated his/her understanding and acceptance.  ? ? ? ?Dental advisory given ? ?Plan Discussed with: CRNA ? ?Anesthesia Plan Comments:   ? ? ? ? ? ? ?Anesthesia Quick Evaluation ? ?

## 2021-07-27 NOTE — Transfer of Care (Signed)
Immediate Anesthesia Transfer of Care Note ? ?Patient: Brooke Beasley ? ?Procedure(s) Performed: TRANSESOPHAGEAL ECHOCARDIOGRAM (TEE) ?BUBBLE STUDY ? ?Patient Location: Endoscopy Unit ? ?Anesthesia Type:MAC ? ?Level of Consciousness: drowsy ? ?Airway & Oxygen Therapy: Patient Spontanous Breathing and Patient connected to face mask oxygen ? ?Post-op Assessment: Report given to RN and Post -op Vital signs reviewed and stable ? ?Post vital signs: Reviewed and stable ? ?Last Vitals:  ?Vitals Value Taken Time  ?BP 92/45 07/27/21 0915  ?Temp    ?Pulse    ?Resp 21 07/27/21 0916  ?SpO2    ?Vitals shown include unvalidated device data. ? ?Last Pain:  ?Vitals:  ? 07/27/21 0809  ?TempSrc: Tympanic  ?PainSc: 0-No pain  ?   ? ?  ? ?Complications: No notable events documented. ?

## 2021-07-28 ENCOUNTER — Encounter (HOSPITAL_COMMUNITY): Payer: Self-pay | Admitting: Cardiovascular Disease

## 2021-07-28 DIAGNOSIS — M4646 Discitis, unspecified, lumbar region: Secondary | ICD-10-CM | POA: Diagnosis not present

## 2021-07-28 DIAGNOSIS — A0472 Enterocolitis due to Clostridium difficile, not specified as recurrent: Secondary | ICD-10-CM | POA: Diagnosis not present

## 2021-07-28 DIAGNOSIS — K651 Peritoneal abscess: Secondary | ICD-10-CM | POA: Diagnosis not present

## 2021-07-28 DIAGNOSIS — N1831 Chronic kidney disease, stage 3a: Secondary | ICD-10-CM | POA: Diagnosis not present

## 2021-07-28 DIAGNOSIS — K623 Rectal prolapse: Secondary | ICD-10-CM

## 2021-07-28 LAB — BODY FLUID CULTURE W GRAM STAIN: Gram Stain: NONE SEEN

## 2021-07-28 LAB — CBC
HCT: 33 % — ABNORMAL LOW (ref 36.0–46.0)
Hemoglobin: 10.6 g/dL — ABNORMAL LOW (ref 12.0–15.0)
MCH: 28.3 pg (ref 26.0–34.0)
MCHC: 32.1 g/dL (ref 30.0–36.0)
MCV: 88.2 fL (ref 80.0–100.0)
Platelets: 236 10*3/uL (ref 150–400)
RBC: 3.74 MIL/uL — ABNORMAL LOW (ref 3.87–5.11)
RDW: 17.2 % — ABNORMAL HIGH (ref 11.5–15.5)
WBC: 10.1 10*3/uL (ref 4.0–10.5)
nRBC: 0 % (ref 0.0–0.2)

## 2021-07-28 LAB — CULTURE, BLOOD (ROUTINE X 2)
Culture: NO GROWTH
Culture: NO GROWTH
Special Requests: ADEQUATE
Special Requests: ADEQUATE

## 2021-07-28 LAB — BASIC METABOLIC PANEL
Anion gap: 9 (ref 5–15)
BUN: 12 mg/dL (ref 8–23)
CO2: 23 mmol/L (ref 22–32)
Calcium: 8.4 mg/dL — ABNORMAL LOW (ref 8.9–10.3)
Chloride: 100 mmol/L (ref 98–111)
Creatinine, Ser: 0.78 mg/dL (ref 0.44–1.00)
GFR, Estimated: >60 mL/min (ref >60)
Glucose, Bld: 92 mg/dL (ref 70–99)
Potassium: 4 mmol/L (ref 3.5–5.1)
Sodium: 132 mmol/L — ABNORMAL LOW (ref 135–145)

## 2021-07-28 LAB — ECHO TEE
AV Mean grad: 12 mmHg
AV Peak grad: 21.3 mmHg
Ao pk vel: 2.31 m/s

## 2021-07-28 MED ORDER — GABAPENTIN 400 MG PO CAPS
400.0000 mg | ORAL_CAPSULE | Freq: Three times a day (TID) | ORAL | Status: DC
Start: 2021-07-28 — End: 2021-08-06
  Administered 2021-07-28 – 2021-08-06 (×28): 400 mg via ORAL
  Filled 2021-07-28 (×28): qty 1

## 2021-07-28 MED ORDER — ALBUTEROL SULFATE (2.5 MG/3ML) 0.083% IN NEBU
2.5000 mg | INHALATION_SOLUTION | RESPIRATORY_TRACT | Status: DC | PRN
  Administered 2021-07-30 – 2021-08-01 (×2): 2.5 mg via RESPIRATORY_TRACT
  Filled 2021-07-28 (×3): qty 3

## 2021-07-28 NOTE — Progress Notes (Addendum)
OT impression: Pt was evaluated s/p the below admission list. Per report pt required some assist with ADLs and mobility for the past few months due to worsening back pain. She lives with her daughter and son in law and has 24/7 support. OT evaluation limited due to back pain. Pt was able to get to the EOB with mod A but unable to stand despite encouragement. Pt requires set up - min A for sitting UB ADLs and max-total A for bed level LB ADLs. She will benefit from OT acutely to address the impairments listed below. Recommend AIR at d/c to progress to mod I level prior to going home with the support of family.  ? ? ? 07/27/21 1500  ?OT Visit Information  ?Last OT Received On 07/27/21  ?Assistance Needed +1  ?History of Present Illness Pt is an 88 y.o. female who presented 07/23/21 with a 2 month hx of increasing low back pain with radiation down her R leg. Per chart, her symptoms began after she underwent rectal prolapse surgery in January of 2023. MRI was concerning for L5-S1 discitis/osteomyelitis. S/p L5-S1 disc aspiration 3/31. PMH: carotid stenosis, CKD, diverticulosis, glaucoma, heart murmur, HTN, HLD, hypothyroidism, pseudogout, osteoporosis  ?Precautions  ?Precautions Fall;Back  ?Precaution Comments back precautions for comfort  ?Restrictions  ?Weight Bearing Restrictions No  ?Home Living  ?Family/patient expects to be discharged to: Private residence  ?Living Arrangements Children  ?Available Help at Discharge Family;Available 24 hours/day;Personal care attendant  ?Type of Home House  ?Home Access Stairs to enter  ?Entrance Stairs-Number of Steps 2  ?Entrance Stairs-Rails Can reach both  ?Home Layout One level  ?Bathroom Shower/Tub Walk-in shower  ?Bathroom Toilet Handicapped height  ?Bathroom Accessibility Yes  ?Home Equipment Shower seat;Grab bars - tub/shower;Transport chair;Rolling Walker (2 wheels);Rollator (4 wheels);Cane - quad;Other (comment);BSC/3in1;Grab bars - toilet  ?Additional Comments Pts  daughter & son in law lives with her  ?Prior Function  ?Prior Level of Function  Independent/Modified Independent;Driving  ?Mobility Comments Started to use the hurrycane when her pain increased and then the RW as the pain progressed. As the pain progressed she stood and walked less and needed some assistance. Prior to her recent surgery she did not use anything to ambulate except intermittently a walking stick when outside  ?ADLs Comments Prior to her recent surgery she was IND and driving. Since her pain has been progressively worsening she began to need assistance for all ADLs  ?Communication  ?Communication HOH  ?Pain Assessment  ?Pain Assessment Faces  ?Faces Pain Scale 10  ?Pain Location back, R leg  ?Pain Descriptors / Indicators Discomfort;Grimacing;Guarding;Tingling  ?Pain Intervention(s) Limited activity within patient's tolerance;Monitored during session  ?Cognition  ?Arousal/Alertness Awake/alert  ?Behavior During Therapy [facility] for tasks assessed/performed  ?Overall Cognitive Status Within Functional Limits for tasks assessed  ?General Comments Distracted by pain often, but otherwise [facility] for tasks assessed.  ?Upper Extremity Assessment  ?Upper Extremity Assessment LUE deficits/detail  ?LUE Deficits / Details hx of shoulder sx & rotator cuff injury. Pt extremely limited in ROM of soulder  ?LUE Coordination decreased gross motor  ?Lower Extremity Assessment  ?Lower Extremity Assessment Defer to PT evaluation  ?Cervical / Trunk Assessment  ?Cervical / Trunk Assessment Other exceptions;Kyphotic  ?Cervical / Trunk Exceptions L5-S2 discitis/oestomyelitis  ?Vision- History  ?Baseline Vision/History 1 Wears glasses  ?Ability to See in Adequate Light 0 Adequate  ?Vision- Assessment  ?Vision Assessment? No apparent visual deficits  ?Perception  ?Perception Tested? No  ?Praxis  ?Praxis tested? Not tested  ?  ADL  ?Overall ADL's  Needs assistance/impaired  ?Eating/Feeding Set up;Bed level  ?Grooming Set up;Bed level   ?Upper Body Bathing Minimal assistance;Bed level  ?Lower Body Bathing Maximal assistance;Bed level  ?Upper Body Dressing  Minimal assistance;Sitting  ?Lower Body Dressing Maximal assistance;Sitting/lateral leans  ?Toilet Transfer Total assistance  ?Toilet Transfer Details (indicate cue type and reason) bed level  ?Toileting- Clothing Manipulation and Hygiene Total assistance;Bed level  ?Functional mobility during ADLs Moderate assistance ?(bed level only)  ?General ADL Comments limited to EOB due to pain  ?Bed Mobility  ?Overal bed mobility Needs Assistance  ?Bed Mobility Rolling;Sidelying to Sit;Sit to Sidelying  ?Rolling Min assist  ?Sidelying to sit Mod assist  ?Sit to sidelying Mod assist  ?General bed mobility comments assist due to pain and generalized weakness. cues for log roll  ?Transfers  ?General transfer comment pt declined despite encouragement  ?Balance  ?Overall balance assessment Needs assistance  ?Sitting-balance support Feet supported;Single extremity supported  ?Sitting balance-Leahy Scale Fair  ?General Comments  ?General comments (skin integrity, edema, etc.) VSS on RA, family present and supportive  ?OT - End of Session  ?Activity Tolerance Patient limited by pain  ?Patient left in bed;with call bell/phone within reach;with bed alarm set;with family/visitor present  ?Nurse Communication Mobility status  ?OT Assessment  ?OT Recommendation/Assessment Patient needs continued OT Services  ?OT Visit Diagnosis Unsteadiness on feet (R26.81);Other abnormalities of gait and mobility (R26.89);Muscle weakness (generalized) (M62.81);History of falling (Z91.81);Pain  ?OT Problem List Decreased strength;Decreased range of motion;Decreased activity tolerance;Impaired balance (sitting and/or standing);Pain;Decreased safety awareness;Decreased knowledge of use of DME or AE;Decreased knowledge of precautions  ?OT Plan  ?OT Frequency (ACUTE ONLY) Min 2X/week  ?OT Treatment/Interventions (ACUTE ONLY)  Self-care/ADL training;Therapeutic exercise;DME and/or AE instruction;Therapeutic activities;Balance training;Patient/family education  ?AM-PAC OT "6 Clicks" Daily Activity Outcome Measure (Version 2)  ?Help from another person eating meals? 3  ?Help from another person taking care of personal grooming? 3  ?Help from another person toileting, which includes using toliet, bedpan, or urinal? 1  ?Help from another person bathing (including washing, rinsing, drying)? 2  ?Help from another person to put on and taking off regular upper body clothing? 3  ?Help from another person to put on and taking off regular lower body clothing? 2  ?6 Click Score 14  ?Progressive Mobility  ?What is the highest level of mobility based on the progressive mobility assessment? Level 2 (Chairfast) - Balance while sitting on edge of bed and cannot stand  ?Activity Dangled on edge of bed  ?OT Recommendation  ?Recommendations for Other Services Rehab consult  ?Follow Up Recommendations Acute inpatient rehab (3hours/day)  ?Patient can return home with the following A little help with walking and/or transfers;A lot of help with bathing/dressing/bathroom;Assistance with cooking/housework;Direct supervision/assist for medications management;Assist for transportation;Help with stairs or ramp for entrance  ?Functional Status Assessent Patient has had a recent decline in their functional status and demonstrates the ability to make significant improvements in function in a reasonable and predictable amount of time.  ?OT Equipment Other (comment) ?(defer)  ?Individuals Consulted  ?Consulted and Agree with Results and Recommendations Patient  ?Acute Rehab OT Goals  ?Patient Stated Goal less pain  ?OT Goal Formulation With patient  ?Time For Goal Achievement 08/11/21  ?Potential to Achieve Goals Good  ?OT Time Calculation  ?OT Start Time (ACUTE ONLY) 1500  ?OT Stop Time (ACUTE ONLY) 1520  ?OT Time Calculation (min) 20 min  ?OT General Charges  ?$OT Visit  1 Visit  ?OT  Evaluation  ?$OT Eval Moderate Complexity 1 Mod  ?Written Expression  ?Dominant Hand Left  ? ?

## 2021-07-28 NOTE — Progress Notes (Signed)
Inpatient Rehabilitation Admissions Coordinator  ? ?I met at bedside with patient , friend, Fraser Din, and spoke with daughter, [name], by phone. We reviewed cost of care of a potential CIR admit. They prefer I pursue insurance approval, but patient questions her pain tolerance today. I will begin insurance approval with [employer]. ? ?Danne Baxter, RN, MSN ?Rehab Admissions Coordinator ?[phone] ?07/28/2021 10:57 AM ? ?

## 2021-07-28 NOTE — Progress Notes (Addendum)
? ?Brooke Beasley ?[id number] ?Jun 03, 1930 ? ?CARE TEAM: ? ?PCP: [name], MD ? ?Outpatient Care Team: Patient Care Team: ?[name], MD as PCP - General (Family Medicine) ?Kate Sable, MD as PCP - Cardiology (Cardiology) ?Dannielle Karvonen, RN as [place] Management ?Michael Boston, MD as Consulting Physician (General Surgery) ?Lin Landsman, MD as Consulting Physician (Gastroenterology) ?Kate Sable, MD as Consulting Physician (Cardiology) ? ?Inpatient Treatment Team: Treatment Team: Attending Provider: Nita Sells, MD; Rounding Team: Billee Cashing Infectious, Rounding, MD; Rounding Team: Minerva Ends, MD; Nurse Practitioner: Marvis Moeller, NP; Consulting Physician: Dixie Dials, MD; Consulting Physician: Michael Boston, MD; CIR Admissions Coordinator: Cristina Gong, RN; Registered Nurse: Renee Ramus, RN; Occupational Therapist: Lavon Paganini, OT; Utilization Review: Lacretia Leigh, RN; Occupational Therapist: Aundra Millet, OT; Case Manager: Angelita Ingles, RN ? ? ?Problem List:  ? ?Principal Problem: ?  Lumbar discitis ?Active Problems: ?  Hypothyroidism ?  HLD (hyperlipidemia) ?  Hypertension, essential ?  Valvular heart disease ?  Stage 3a chronic kidney disease (CKD) ([place]) ?  Right-sided low back pain with right-sided sciatica ?  New onset a-fib [facility]) ?  Disc disorder ? ? ?1 Day Post-Op  05/26/2021 ? ?PPOST-OPERATIVE DIAGNOSIS: RECTAL PROLAPSE ?  ?PROCEDURE:  ?-ROBOTIC LOW ANTERIOR RECTOSIGMOID RESECTION ?-RECTOPEXY ?-INTRAOPERATIVE ASSESSMENT OF TISSUE VASCULAR PERFUSION USING ICG (indocyanine green) IMMUNOFLUORESCENCE ?-TRANSVERSUS ABDOMINIS PLANE (TAP) BLOCK - BILATERAL ?-RIGID PROCTOSCOPY ?-ANORECTAL EUA ?  ?SURGEON:  Adin Hector, MD ? ?OR FINDINGS: Very redundant sigmoid colon.  Circumferential prolapse of distal and mid rectum circumferentially with irritated hemorrhoids.  About 45 cm redundant colon resected from mid  descending to proximal rectum.  Suture rectopexy done.  Patient has a 39 EEA end mid descending colon-to-end mid rectum stapled anastomosis.   At rest 10 cm from the anal verge.  Irritated internal hemorrhoids but grade 2 after prolapse corrected.  Held off on more aggressive resection/ligation/pexy.  Suspect hemorrhoid bleeding from chronic prolapse irritation. ?  ? ?Assessment ? ?Readmission for severe low back pain with sciatica. ? ?Over 2 months status post resection rectopexy. ? ?(Hospital Stay = 4 days) ? ?Plan: ? ?Slowly recovering. ? ?Patient had much improvement with steroid taper when she was recovering with some rhinitis.  Daughter suspect that that may be in the cards to restart and see if that would help.  Defer to neurology/neurosurgery/primary service.   ? ?I suspect she had some mild Enterococcus seeding without any major abscess that triggered severe lumbosacral pain and sciatica. ? ?Solid diet as tolerated. ? ?Fiber bowel regimen.  Fiber/iron/Imodium twice daily with breakthrough.  Try to adjust so that she is going 1-2 times a day.  Will be a challenge given her history of recurrent C. difficile, on oral Vanco and on IV antibiotics, not a normal diet. ? ?Defer to antibiotics for infectious disease.  Looks like they are going to do 6 weeks of IV ampicillin/sulbactam. ? ?Vancomycin prophylaxis given her history of recurrent C. difficile. ? ?Mobilize. ? ?Not surprising that she still has some incontinence to flatus and stool that is mild but markedly improved.  Usually rectal tone will improve and the sphincter will become more incontinent over time.  Will be challenged in this 88 year old woman.  However, she feels much better with that already.  Guardedly hopeful sign. ? ?High-fiber bowel regimen to help. ? ? ? ? ? ?07/28/2021 ? ? ? ?Subjective: ?(Chief complaint) ? ?Daughters in room.. ? ?Patient has persistent sharp gluteal/lower back pain  radiating down her right lower extremity with sitting &  prefers to lie down flat. ? ?Hungry. ? ?Bowel movements are more thick.  Much less leaking. ? ?Disappointed that she struggled with all the sharp pain when she flexes her right hip. ? ?Objective: ? ?Vital signs: ? ?Vitals:  ? 07/27/21 2316 07/28/21 0330 07/28/21 0500 07/28/21 0738  ?BP: 125/60 139/71  (!) 147/84  ?Pulse: 71 69  75  ?Resp: '17 18  20  '$ ?Temp: 98.2 ?F (36.8 ?C) 97.9 ?F (36.6 ?C)  (!) 97.5 ?F (36.4 ?C)  ?TempSrc: Oral Oral  Oral  ?SpO2: 94% 95%  100%  ?Weight:   71.6 kg   ?Height:      ? ? ?Last BM Date : 07/28/21 ? ?Intake/Output  ? ?Yesterday: ? 04/03 0701 - 04/04 0700 ?In: 200 [I.V.:200] ?Out: 950 [Urine:950] ?This shift: ? No intake/output data recorded. ? ?Bowel function: ? Flatus: YES ? BM:  YES ? Drain: (No drain) ? ? ?Physical Exam: ? ? ?Constitutional: Not cachectic.  Hygeine adequate.  Vitals signs as above.  Bright and alert. ?Eyes: Pupils reactive, normal extraocular movements. Sclera nonicteric ?Neuro: CN II-XII intact.  No major focal sensory defects.  No major motor deficits. ?Lymph: No head/neck/groin lymphadenopathy ?Psych:  No severe agitation.  No severe anxiety.  Judgment & insight Adequate, Oriented x4, ?HENT: Normocephalic, Mucus membranes moist.  No thrush.  Hard of hearing ?Neck: Supple, No tracheal deviation.  No obvious thyromegaly ?Chest: No pain to chest wall compression.  Good respiratory excursion.  No audible wheezing ?CV:  Pulses intact.  irregular rhythm.  No major extremity edema ? ?Abdomen:  Flat Hernia: Not present. Diastasis recti: Not present. Soft.   Nondistended.  Nontender.  No hepatomegaly.  No splenomegaly ? ?Gen:  Inguinal hernia: Not present.  Inguinal lymph nodes: without lymphadenopathy.   ? ?Rectal: (Deferred) ? ?Ext: No obvious deformity or contracture.  Edema: Not present.  No cyanosis ?Skin: No major subcutaneous nodules.  Warm and dry ?Musculoskeletal: Severe joint rigidity not present.  No obvious clubbing.  No digital petechiae.  Pain with right hip  flexion ? ? ? ?Results:  ? ?Cultures: ?Recent Results (from the past 720 hour(s))  ?MRSA Next Gen by PCR, Nasal     Status: None  ? Collection Time: 07/23/21  2:06 PM  ? Specimen: Nasal Mucosa; Nasal Swab  ?Result Value Ref Range Status  ? MRSA by PCR Next Gen NOT DETECTED NOT DETECTED Final  ?  Comment: (NOTE) ?The GeneXpert MRSA Assay (FDA approved for NASAL specimens only), ?is one component of a comprehensive MRSA colonization surveillance ?program. It is not intended to diagnose MRSA infection nor to guide ?or monitor treatment for MRSA infections. ?Test performance is not FDA approved in patients less than 2 years ?old. ?Performed at [facility], [place] [address] [place], [place] ?[address] ?  ?Culture, blood (routine x 2)     Status: None (Preliminary result)  ? Collection Time: 07/23/21  3:57 PM  ? Specimen: BLOOD  ?Result Value Ref Range Status  ? Specimen Description   Final  ?  BLOOD BLOOD RIGHT FOREARM ?Performed at [facility], [address] [place], [place] [address] ?  ? Special Requests   Final  ?  BOTTLES DRAWN AEROBIC AND ANAEROBIC Blood Culture adequate volume ?Performed at [facility], [address], [place], [place] [address] ?  ? Culture   Final  ?  NO GROWTH 4 DAYS ?Performed at [facility], [place] [address] [place], [place] [address] ?  ? Report Status PENDING  Incomplete  ?Culture, blood (routine x 2)     Status: None (Preliminary result)  ? Collection Time: 07/23/21  6:27 PM  ? Specimen: BLOOD  ?Result Value Ref Range Status  ? Specimen Description   Final  ?  BLOOD RIGHT ANTECUBITAL ?Performed at [facility], [address] [place], [place] [address] ?  ? Special Requests   Final  ?  Blood Culture adequate volume BOTTLES DRAWN AEROBIC AND ANAEROBIC ?Performed at [facility], [address] [place], [place] [address] ?  ? Culture   Final  ?  NO GROWTH 4 DAYS ?Performed at [facility], [place] [address] [place], [place] [address] ?  ? Report Status PENDING  Incomplete  ?Culture, blood (routine x 2)     Status: None (Preliminary result)  ? Collection Time: 07/24/21 11:05 AM  ? Specimen:

## 2021-07-28 NOTE — Progress Notes (Signed)
Occupational Therapy Treatment ?Patient Details ?Name: Brooke Beasley ?MRN: [id number] ?DOB: Dec 29, 1930 ?Today's Date: 07/28/2021 ? ? ?History of present illness Pt is an 88 y.o. female who presented 07/23/21 with a 2 month hx of increasing low back pain with radiation down her R leg. Per chart, her symptoms began after she underwent rectal prolapse surgery in January of 2023. MRI was concerning for L5-S1 discitis/osteomyelitis. S/p L5-S1 disc aspiration 3/31. PMH: carotid stenosis, CKD, diverticulosis, glaucoma, heart murmur, HTN, HLD, hypothyroidism, pseudogout, osteoporosis ?  ?OT comments ? Dilynn is progressing well, she continues to be limited by back pain but was able to progress OOB this date. Upon arrival pt was soiled and required max A for LB bathing at bed level and min A for UB bathing. Pt completed 4x sit<>stands and was able to take pivotal steps to the [facility] and the recliner with RW and min A. PT continues to benefit from OT acutely, d/c remains appropriate.  ? ?Recommendations for follow up therapy are one component of a multi-disciplinary discharge planning process, led by the attending physician.  Recommendations may be updated based on patient status, additional functional criteria and insurance authorization. ?   ?Follow Up Recommendations ? Acute inpatient rehab (3hours/day)  ?  ?   ?Patient can return home with the following ? A little help with walking and/or transfers;A lot of help with bathing/dressing/bathroom;Assistance with cooking/housework;Direct supervision/assist for medications management;Assist for transportation;Help with stairs or ramp for entrance ?  ?Equipment Recommendations ? Other (comment)  ?  ?Recommendations for Other Services   ? ?  ?Precautions / Restrictions Precautions ?Precautions: Fall;Back ?Precaution Comments: back precautions for comfort ?Restrictions ?Weight Bearing Restrictions: No  ? ? ?  ? ?Mobility Bed Mobility ?Overal bed mobility: Needs Assistance ?Bed  Mobility: Rolling, Sidelying to Sit ?Rolling: Min guard ?Sidelying to sit: Min assist ?  ?  ?  ?General bed mobility comments: cues for technique; HOB elevated to sit for pain management ?  ? ?Transfers ?Overall transfer level: Needs assistance ?Equipment used: Rolling walker (2 wheels) ?Transfers: Sit to/from Stand, Bed to chair/wheelchair/BSC ?Sit to Stand: Min guard ?  ?  ?Step pivot transfers: Min assist ?  ?  ?General transfer comment: pivotal steps to [facility] and recliner this session; min A for cues and unsteady balance with increased pain ?  ?  ?Balance Overall balance assessment: Needs assistance ?Sitting-balance support: Feet supported ?Sitting balance-Leahy Scale: Fair ?  ?  ?Standing balance support: Bilateral upper extremity supported ?Standing balance-Leahy Scale: Poor ?  ?  ?  ?   ? ?ADL either performed or assessed with clinical judgement  ? ?ADL Overall ADL's : Needs assistance/impaired ?  ?  ?Grooming: Set up;Sitting ?  ?  ?  ?Lower Body Bathing: Maximal assistance;Sit to/from stand ?  ?Upper Body Dressing : Set up;Sitting ?  ?Lower Body Dressing: Moderate assistance;Sit to/from stand ?  ?Toilet Transfer: Minimal assistance;Rolling walker (2 wheels);Stand-pivot ?  ?Toileting- Clothing Manipulation and Hygiene: Maximal assistance;Sit to/from stand ?Toileting - Clothing Manipulation Details (indicate cue type and reason): reliant on BUE supported on RW in standing ?  ?  ?Functional mobility during ADLs: Minimal assistance;Rolling walker (2 wheels) ?General ADL Comments: soiled upon arrival. Bathed at bed level and in sitting. mod-max A for all LB tasks due to back pain and RLE pain. Benefits from gentle encouragement and cues for safety throughout due to internal distraction ?  ? ?Extremity/Trunk Assessment Upper Extremity Assessment ?Upper Extremity Assessment: LUE deficits/detail ?LUE Deficits / Details:  hx of shoulder sx & rotator cuff injury. Pt extremely limited in ROM of soulder ?LUE Sensation:  WNL ?LUE Coordination: decreased gross motor ?  ?Lower Extremity Assessment ?Lower Extremity Assessment: Defer to PT evaluation ?  ?  ?  ? ?Vision   ?Vision Assessment?: No apparent visual deficits ?  ?Perception Perception ?Perception: Within Functional Limits ?  ?Praxis Praxis ?Praxis: Intact ?  ? ?Cognition Arousal/Alertness: Awake/alert ?Behavior During Therapy: Anxious ?Overall Cognitive Status: Within Functional Limits for tasks assessed ?  ?  ?  ?  ?  ?  ?  ?  ?General Comments: Distracted by pain often, but otherwise [facility] for tasks assessed. ?  ?  ?   ?   ?   ?General Comments VSS on RA, family present and supportive  ? ? ?Pertinent Vitals/ Pain       Pain Assessment ?Pain Assessment: Faces ?Faces Pain Scale: Hurts little more ?Pain Location: back, R leg ?Pain Descriptors / Indicators: Discomfort, Grimacing, Guarding, Tingling ?Pain Intervention(s): Limited activity within patient's tolerance, Monitored during session ? ?Home Living Family/patient expects to be discharged to:: Private residence ?  ?  ?  ?  ?  ?  ?  ?  ?  ?  ?  ?  ?  ?  ?  ?  ?  ?  ? ?  ?   ? ?Frequency ? Min 2X/week  ? ? ? ? ?  ?Progress Toward Goals ? ?OT Goals(current goals can now be found in the care plan section) ? Progress towards OT goals: Progressing toward goals ? ?Acute Rehab OT Goals ?Patient Stated Goal: less pain ?OT Goal Formulation: With patient ?Time For Goal Achievement: 08/11/21 ?Potential to Achieve Goals: Good ?ADL Goals ?Pt Will Perform Lower Body Bathing: with modified independence;sit to/from stand ?Pt Will Perform Lower Body Dressing: with modified independence;sit to/from stand ?Pt Will Transfer to Toilet: with modified independence;ambulating ?Pt Will Perform Toileting - Clothing Manipulation and hygiene: with modified independence;sit to/from stand  ?Plan Discharge plan remains appropriate   ? ?   ?AM-PAC OT "6 Clicks" Daily Activity     ?Outcome Measure ? ? Help from another person eating meals?: A Little ?Help  from another person taking care of personal grooming?: A Little ?Help from another person toileting, which includes using toliet, bedpan, or urinal?: A Lot ?Help from another person bathing (including washing, rinsing, drying)?: A Lot ?Help from another person to put on and taking off regular upper body clothing?: A Little ?Help from another person to put on and taking off regular lower body clothing?: A Lot ?6 Click Score: 15 ? ?  ?End of Session Equipment Utilized During Treatment: Gait belt;Rolling walker (2 wheels) ? ?OT Visit Diagnosis: Unsteadiness on feet (R26.81);Other abnormalities of gait and mobility (R26.89);Muscle weakness (generalized) (M62.81);History of falling (Z91.81);Pain ?  ?Activity Tolerance Patient tolerated treatment well ?  ?Patient Left in chair;with call bell/phone within reach;with chair alarm set;with nursing/sitter in room;with family/visitor present ?  ?Nurse Communication Mobility status ?  ? ?   ? ?Time: 3007-6226 ?OT Time Calculation (min): 26 min ? ?Charges: OT General Charges ?$OT Visit: 1 Visit ?OT Treatments ?$Self Care/Home Management : 23-37 mins ? ? ? ?Keshawn Fiorito A Tenae Graziosi ?07/28/2021, 9:52 AM ?

## 2021-07-28 NOTE — Progress Notes (Signed)
?   ? ?[facility] for Infectious Disease ? ?Date of Admission:  07/23/2021    ?       ?Reason for visit: Follow up on discitis/osteomyelitis ? ?Current antibiotics: ?Ampicillin sulbactam 4/2--present ?Vancomycin p.o. 3/31-present ?  ?Previous antibiotics: ?Daptomycin 3/31-4/2 ?Ceftriaxone 3/31-4/3 ? ?ASSESSMENT:   ? ?88 y.o. female admitted with: ? ?Discitis/osteomyelitis L5-S1 due to Enterococcus faecalis: Infection initially noted on MRI without contrast 07/23/2021 after several weeks of worsening back pain.  Follow-up MRI with contrast 3/31 also noted a 4 mm epidural phlegmon without drainable component.  Status post IR disc aspiration 3/31 with cultures as noted with Enterococcus faecalis.  She had a TEE completed 4/3 to ensure no subacute endocarditis which was negative. ?Intra-abdominal fluid collections: Repeat CT scan 3/31 with nearly resolved loculated collections. ?History of recurrent C. difficile: Currently on oral vancomycin prophylaxis. ?CKD 3: Stable. ?Recent surgery for rectal prolapse on 05/26/2021. ? ?RECOMMENDATIONS:   ? ?Continue ampicillin sulbactam ?Pain control ?Therapy recommendations ?Anti-inflammatories per primary team.  If a short few day course of steroids was felt to be beneficial for inflammation would not be opposed, however, this would have to be used with caution in the setting of ongoing infection ?Will follow ? ? ?Principal Problem: ?  Lumbar discitis ?Active Problems: ?  Hypothyroidism ?  HLD (hyperlipidemia) ?  Hypertension, essential ?  Valvular heart disease ?  Stage 3a chronic kidney disease (CKD) ([place]) ?  Right-sided low back pain with right-sided sciatica ?  New onset a-fib [facility]) ?  Disc disorder ? ? ? ?MEDICATIONS:   ? ?Scheduled Meds: ? apixaban  5 mg Oral BID  ? brimonidine  1 drop Both Eyes Daily  ? Chlorhexidine Gluconate Cloth  6 each Topical Daily  ? clonazePAM  0.5 mg Oral QHS  ? gabapentin  400 mg Oral TID  ? latanoprost  1 drop Both Eyes QHS  ? levothyroxine   100 mcg Oral Daily  ? magnesium oxide  800 mg Oral BID  ? metoprolol tartrate  12.5 mg Oral BID  ? naproxen  375 mg Oral BID WC  ? sertraline  100 mg Oral q AM  ? sodium chloride flush  10-40 mL Intracatheter Q12H  ? vancomycin  125 mg Oral QHS  ? ?Continuous Infusions: ? ampicillin-sulbactam (UNASYN) IV 3 g (07/28/21 4709)  ? ?PRN Meds:.acetaminophen **OR** [DISCONTINUED] acetaminophen, Gerhardt's butt cream, HYDROcodone-acetaminophen, hydrOXYzine, morphine injection, ondansetron **OR** ondansetron (ZOFRAN) IV, sodium chloride flush ? ?SUBJECTIVE:  ? ?24 hour events:  ?Patient up sitting in the chair this morning accompanied by her sitter ?Afebrile, Tmax 98.2 ?PICC line placed ?Therapy recommending CIR ?WBC 10.1 ?Renal function stable ? ? ?Patient remains hopeful that her back pain will continue to improve.  She continues to report pain.  No fevers.  They are curious about steroids to help with inflammation. ? ?Review of Systems  ?All other systems reviewed and are negative. ? ?  ?OBJECTIVE:  ? ?Blood pressure (!) 147/84, pulse 75, temperature (!) 97.5 ?F (36.4 ?C), temperature source Oral, resp. rate 20, height '5\' 1"'$  (1.549 m), weight 71.6 kg, SpO2 100 %. ?Body mass index is 29.83 kg/m?. ? ?Physical Exam ?Constitutional:   ?   General: She is not in acute distress. ?   Appearance: Normal appearance.  ?   Comments: She is sitting up in the chair and in no acute distress.  ?Eyes:  ?   Extraocular Movements: Extraocular movements intact.  ?   Conjunctiva/sclera: Conjunctivae normal.  ?Pulmonary:  ?  Effort: Pulmonary effort is normal. No respiratory distress.  ?Abdominal:  ?   General: There is no distension.  ?   Palpations: Abdomen is soft.  ?   Tenderness: There is no abdominal tenderness.  ?Musculoskeletal:     ?   General: Normal range of motion.  ?   Cervical back: Normal range of motion and neck supple.  ?   Comments: Right upper extremity PICC line in place  ?Skin: ?   General: Skin is warm and dry.   ?Neurological:  ?   General: No focal deficit present.  ?   Mental Status: She is alert and oriented to person, place, and time.  ?Psychiatric:     ?   Mood and Affect: Mood normal.     ?   Behavior: Behavior normal.  ? ? ? ?Lab Results: ?Lab Results  ?Component Value Date  ? WBC 10.1 07/28/2021  ? HGB 10.6 (L) 07/28/2021  ? HCT 33.0 (L) 07/28/2021  ? MCV 88.2 07/28/2021  ? PLT 236 07/28/2021  ?  ?Lab Results  ?Component Value Date  ? NA 132 (L) 07/28/2021  ? K 4.0 07/28/2021  ? CO2 23 07/28/2021  ? GLUCOSE 92 07/28/2021  ? BUN 12 07/28/2021  ? CREATININE 0.78 07/28/2021  ? CALCIUM 8.4 (L) 07/28/2021  ? GFRNONAA >60 07/28/2021  ? GFRAA 58 (L) 03/28/2019  ?  ?Lab Results  ?Component Value Date  ? ALT 13 07/27/2021  ? AST 17 07/27/2021  ? ALKPHOS 77 07/27/2021  ? BILITOT 0.1 (L) 07/27/2021  ? ? ?   ?Component Value Date/Time  ? CRP 5.0 (H) 07/23/2021 1555  ? ? ?   ?Component Value Date/Time  ? ESRSEDRATE 48 (H) 07/23/2021 1555  ? ?  ?I have reviewed the micro and lab results in Epic. ? ?Imaging: ?DG CHEST PORT 1 VIEW ? ?Result Date: 07/27/2021 ?CLINICAL DATA:  PICC placement EXAM: PORTABLE CHEST 1 VIEW COMPARISON:  05/06/2021, 07/11/2018 FINDINGS: Left shoulder replacement. Right upper extremity central venous catheter tip over the SVC. Right lung grossly clear. Cardiomegaly with possible small left effusion. Airspace disease at the left base. Mild diffuse bronchitic changes. Aortic atherosclerosis. Scoliosis of the spine. IMPRESSION: 1. Right upper extremity central venous catheter tip overlies the SVC. 2. Small left effusion with airspace disease at the left base which may be due to atelectasis or pneumonia. Cardiomegaly. Electronically Signed   By: Donavan Foil M.D.   On: 07/27/2021 23:00  ? ?[place] EKG SITE RITE ? ?Result Date: 07/27/2021 ?If [employer] not attached, placement could not be confirmed due to current cardiac rhythm.   ? ?Imaging independently reviewed in Epic.  ? ? ?Mignon Pine ?[facility] ?[place] ?[phone] pager ?07/28/2021, 11:01 AM ? ? ?

## 2021-07-28 NOTE — Progress Notes (Signed)
?PROGRESS NOTE ? ? ?Brooke Beasley  VCB:[id number] DOB: 1930-11-09 DOA: 07/23/2021 ?PCP: [name], MD  ?Brief Narrative:  ? 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 Marius Ditch ?--prior colonoscopy 08/2019 sigmoid diverticuli polyps rectal prolapse large external hemorrhoids ?Rectal prolapse status post robotic low anterior resection with rectopexy proctoscopy Dr. Johney Maine 05/26/2021 ?  ?Seen in office 07/13/2021 with 2 months of intermittent discomfort and low back pain worsening over the 2 weeks prior to seeing Dr. Danise Mina  ?2/2 right sciatica-given steroid taper scheduled Tylenol as needed hydrocodone ?Daughter at the bedside tells me she was having some bruising postprocedure by Dr. Ronald Lobo bruising however was on the right side where his low anterior resection was on the left-she had also been walking more frequently ?Also recent treatment 2 rounds of antibiotics Augmentin ?Because of unrelenting pain outpatient MRI performed showing phlegmon versus abscess L5-S1 with T1 abnormality in addition ? ?Sent to hospital for admission and found to be in new onset A-fib on evaluation ?BUN/creatinine 23/0.9,  ?CRP 5, ESR 48 (looks like had elevated ESR however previously in 2015) ?White count 12 platelet 350 predominant neutrophilia ?Blood culture X2--3/30 performed ? ?3/31-admit--MRI=Phlegmon 23m ,Disc aspiration=Enterococcus, CT abd/pelv--decreasing size seroma ?4/1-start Lovenox--adjust pain meds ?4/2-started endocarditis coverage with Unasyn-cardiology Dr. KDoylene Canardconsulted to coordinate TEE ?4/3-TEE negative for endocarditis antibiotics adjusted by ID, PICC line placed by vascular access team ?4/4-pain in right lower extremity continues to improve on  gabapentin/Naprosyn-cautious use given on DOAC now-steroids contemplated but ultimately decision made to avoid for now-[facility] is interested in patient but authorizations will be difficult to obtain at least for 48 hours-please see MAR which I have completed to the best of my ability ? ?Hospital-Problem based course ? ?Discitis osteomyelitis L5-S1, 4 mm epidural phlegmon ?Endocarditis ruled out by TEE 4/3 ?Status post IR guided disc aspiration 3/31---fluid culture growing ?Enterococcus--neurosurgery Dr. DReatha Armour IR input appreciated ?Currently only Unasyn --> PICC line placed 4/3-stop date antibiotics 09/03/2021 (6 weeks) ?Norco 08/27/2023 q4 prn, mild pain Tylenol--Cautious Naprosyn 375 twice daily added on 4/3 patient already on gabapentin 3 times daily ?IV morphine on board 1 mg every 3 as needed severe pain ?NGTD blood culture 3/30---NGTD repeat cultures 07/24/2021  ?New onset A-fib-etiology probably secondary to infection ?CHA2DS2-VASc score=5 ?At least mild aortic valve stenosis and regurgitation on TEE ?Metoprolol 12.5 twice daily given heart rates low 100s (only has intolerance not allergy) ?Lovenox transitioned to Eliquis 4/3--[facility] ?Recheck magnesium in a.m ?Outpatient follow-up Dr. KDoylene Canardfor aortic valve stenosis as needed ?Subclinical Hypothyroid ?T4 is 0.77 with elevated TSH --> subclinical hypothyroidism-would not adjust thyroxine when acutely ill-recheck TFT  3 to 4 weeks  ?Continue 100 mcg dose ?Prior recurrent C. Difficile ?Prolonged prior vancomycin taper plus Zinplava 04/2019---  ?needs vancomycin 125 p.o. daily ending 5/17 for secondary prophylaxis per ID ?CKD 3B ?Mild hyperkalemia now resolved ?Hold irbesartan , Lasix 20 Aldactone ?Neuropathy NOS ?increased gabapentin to 400 tid--pain is much improved with this ?Monitor mentation carefully ?Anxiety ?Klonopin 0.5 at bedtime ? ?DVT prophylaxis: SCD ?Code Status: DNR confirmed at bedside ?Family  Communication:  ?Discussed with daughter JMarcie Balat the bedside in detail-phone number [phone]?Disposition:  ?Status is: Observation ?The patient will require care spanning > 2 midnights and should be moved to  inpatient because:  ? ?Improved some--await CIR auth--SEE completed MAR ? ?  ?Consultants:  ?Infectious disease ?Neurosurgery ? ?Procedures: Multiple ? ?Antimicrobials: None as yet ? ?Subjective: ? ?Pain overall is improved with increasing gabapentin and addition of Naprosyn ?PICC line was placed late yesterday afternoon ?She is now concerned about whether she will be able to work at [employer]-I reassured her to continue working and not worry too much about the same ?She has had no fevers no chills and is eating ? ?Objective: ?Vitals:  ? 07/27/21 2316 07/28/21 0330 07/28/21 0500 07/28/21 0738  ?BP: 125/60 139/71  (!) 147/84  ?Pulse: 71 69  75  ?Resp: _0 ?Temp: 98.2 ?F (36.8 ?C) 97.9 ?F (36.6 ?C)  (!) 97.5 ?F (36.4 ?C)  ?TempSrc: Oral Oral  Oral  ?SpO2: 94% 95%  100%  ?Weight:   71.6 kg   ?Height:      ? ? ?Intake/Output Summary (Last 24 hours) at 07/28/2021 1613 ?Last data filed at 07/28/2021 0844 ?Gross per 24 hour  ?Intake 240 ml  ?Output 1300 ml  ?Net -1060 ml  ? ? ?Filed Weights  ? 07/25/21 0500 07/27/21 0809 07/28/21 0500  ?Weight: 70.3 kg 72.6 kg 71.6 kg  ? ? ?Examination: ? ?Frail pleasant no distress ?Poor exam to posterolateral lung fields ?S1-S2 irregularly irregular?  Murmur ?Abdomen soft slightly tender in right lower quadrant ?Straight leg raise bilaterally is somewhat antalgic less antalgic than it was previously and she is able to sit up with some assistance ?Rest of neuro is grossly intact example touch ETC ?Psych euthymic coherent ? ?Data Reviewed: personally reviewed  ? ?CBC ?   ?Component Value Date/Time  ? WBC 10.1 07/28/2021 0307  ? RBC 3.74 (L) 07/28/2021 0307  ? HGB 10.6 (L) 07/28/2021 0307  ? HGB 13.7 04/09/2021 1417  ? HCT 33.0 (L) 07/28/2021 0307  ? HCT 42.0 04/09/2021 1417  ? PLT  236 07/28/2021 0307  ? PLT 259 04/09/2021 1417  ? MCV 88.2 07/28/2021 0307  ? MCV 88 04/09/2021 1417  ? MCH 28.3 07/28/2021 0307  ? MCHC 32.1 07/28/2021 0307  ? RDW 17.2 (H) 07/28/2021 5329  ? RDW 16.2 (H) 04/09/2021 1417  ? LYMPHSABS 2.3 07/26/2021 0524  ? LYMPHSABS 1.8 03/27/2021 0935  ? MONOABS 1.8 (H) 07/26/2021 0524  ? EOSABS 0.3 07/26/2021 0524  ? EOSABS 0.5 (H) 03/27/2021 0935  ? BASOSABS 0.0 07/26/2021 0524  ? BASOSABS 0.1 03/27/2021 0935  ? ? ?  Latest Ref Rng & Units 07/28/2021  ?  3:07 AM 07/27/2021  ?  3:37 AM 07/26/2021  ?  5:24 AM  ?CMP  ?Glucose 70 - 99 mg/dL 92   124   123    ?BUN 8 - 23 mg/dL _1 ?Creatinine 0.44 - 1.00 mg/dL 0.78   0.77   0.94    ?Sodium 135 - 145 mmol/L 132   131   131    ?Potassium 3.5 - 5.1 mmol/L 4.0   3.8   4.2    ?Chloride 98 - 111 mmol/L 100   98   96    ?CO2 22 - 32 mmol/L _2 ?Calcium 8.9 - 10.3 mg/dL 8.4   8.2   8.5    ?Total Protein 6.5 - 8.1 g/dL  5.5     ?Total Bilirubin 0.3 - 1.2 mg/dL  0.1     ?Alkaline Phos 38 -  126 U/L  77     ?AST 15 - 41 U/L  17     ?ALT 0 - 44 U/L  13     ? ? ? ?Radiology Studies: ?DG CHEST PORT 1 VIEW ? ?Result Date: 07/27/2021 ?CLINICAL DATA:  PICC placement EXAM: PORTABLE CHEST 1 VIEW COMPARISON:  05/06/2021, 07/11/2018 FINDINGS: Left shoulder replacement. Right upper extremity central venous catheter tip over the SVC. Right lung grossly clear. Cardiomegaly with possible small left effusion. Airspace disease at the left base. Mild diffuse bronchitic changes. Aortic atherosclerosis. Scoliosis of the spine. IMPRESSION: 1. Right upper extremity central venous catheter tip overlies the SVC. 2. Small left effusion with airspace disease at the left base which may be due to atelectasis or pneumonia. Cardiomegaly. Electronically Signed   By: Donavan Foil M.D.   On: 07/27/2021 23:00  ? ?[place] EKG SITE RITE ? ?Result Date: 07/27/2021 ?If [employer] not attached, placement could not be confirmed due to current cardiac rhythm.   ? ? ?Scheduled Meds: ? apixaban  5 mg Oral BID  ? brimonidine  1 drop Both Eyes Daily  ? Chlorhexidine Gluconate Cloth  6 each Topical Daily  ? clonazePAM  0.5 mg Oral QHS  ? gabapentin  400 mg Oral TID  ? latanoprost  1

## 2021-07-29 ENCOUNTER — Inpatient Hospital Stay (HOSPITAL_COMMUNITY): Payer: PPO

## 2021-07-29 ENCOUNTER — Other Ambulatory Visit (HOSPITAL_COMMUNITY): Payer: Self-pay

## 2021-07-29 DIAGNOSIS — N1831 Chronic kidney disease, stage 3a: Secondary | ICD-10-CM | POA: Diagnosis not present

## 2021-07-29 DIAGNOSIS — K651 Peritoneal abscess: Secondary | ICD-10-CM | POA: Diagnosis not present

## 2021-07-29 DIAGNOSIS — K623 Rectal prolapse: Secondary | ICD-10-CM | POA: Diagnosis not present

## 2021-07-29 DIAGNOSIS — M4646 Discitis, unspecified, lumbar region: Secondary | ICD-10-CM | POA: Diagnosis not present

## 2021-07-29 LAB — CBC WITH DIFFERENTIAL/PLATELET
Abs Immature Granulocytes: 0.06 10*3/uL (ref 0.00–0.07)
Basophils Absolute: 0 10*3/uL (ref 0.0–0.1)
Basophils Relative: 0 %
Eosinophils Absolute: 0.5 10*3/uL (ref 0.0–0.5)
Eosinophils Relative: 5 %
HCT: 32.6 % — ABNORMAL LOW (ref 36.0–46.0)
Hemoglobin: 10.2 g/dL — ABNORMAL LOW (ref 12.0–15.0)
Immature Granulocytes: 1 %
Lymphocytes Relative: 20 %
Lymphs Abs: 1.7 10*3/uL (ref 0.7–4.0)
MCH: 28.1 pg (ref 26.0–34.0)
MCHC: 31.3 g/dL (ref 30.0–36.0)
MCV: 89.8 fL (ref 80.0–100.0)
Monocytes Absolute: 1 10*3/uL (ref 0.1–1.0)
Monocytes Relative: 12 %
Neutro Abs: 5.2 10*3/uL (ref 1.7–7.7)
Neutrophils Relative %: 62 %
Platelets: 289 10*3/uL (ref 150–400)
RBC: 3.63 MIL/uL — ABNORMAL LOW (ref 3.87–5.11)
RDW: 17.3 % — ABNORMAL HIGH (ref 11.5–15.5)
WBC: 8.4 10*3/uL (ref 4.0–10.5)
nRBC: 0 % (ref 0.0–0.2)

## 2021-07-29 LAB — BASIC METABOLIC PANEL
Anion gap: 7 (ref 5–15)
BUN: 16 mg/dL (ref 8–23)
CO2: 27 mmol/L (ref 22–32)
Calcium: 8.8 mg/dL — ABNORMAL LOW (ref 8.9–10.3)
Chloride: 101 mmol/L (ref 98–111)
Creatinine, Ser: 0.93 mg/dL (ref 0.44–1.00)
GFR, Estimated: 59 mL/min — ABNORMAL LOW (ref >60)
Glucose, Bld: 95 mg/dL (ref 70–99)
Potassium: 4.2 mmol/L (ref 3.5–5.1)
Sodium: 135 mmol/L (ref 135–145)

## 2021-07-29 LAB — CULTURE, BLOOD (ROUTINE X 2)
Culture: NO GROWTH
Culture: NO GROWTH
Special Requests: ADEQUATE
Special Requests: ADEQUATE

## 2021-07-29 LAB — MAGNESIUM: Magnesium: 2.4 mg/dL (ref 1.7–2.4)

## 2021-07-29 MED ORDER — LOPERAMIDE HCL 2 MG PO CAPS
2.0000 mg | ORAL_CAPSULE | Freq: Every day | ORAL | Status: DC
Start: 2021-07-29 — End: 2021-08-06
  Administered 2021-07-29 – 2021-08-05 (×8): 2 mg via ORAL
  Filled 2021-07-29 (×9): qty 1

## 2021-07-29 MED ORDER — MELATONIN 5 MG PO TABS
5.0000 mg | ORAL_TABLET | Freq: Every evening | ORAL | Status: DC | PRN
  Administered 2021-08-01 – 2021-08-04 (×4): 5 mg via ORAL
  Filled 2021-07-29 (×4): qty 1

## 2021-07-29 MED ORDER — HYDROCODONE-ACETAMINOPHEN 5-325 MG PO TABS
1.0000 | ORAL_TABLET | ORAL | Status: DC | PRN
  Administered 2021-07-29 – 2021-07-30 (×4): 1 via ORAL
  Administered 2021-07-31: 2 via ORAL
  Administered 2021-07-31 – 2021-08-01 (×3): 1 via ORAL
  Administered 2021-08-01 (×2): 2 via ORAL
  Administered 2021-08-02: 1 via ORAL
  Administered 2021-08-02 – 2021-08-04 (×4): 2 via ORAL
  Filled 2021-07-29 (×3): qty 1
  Filled 2021-07-29 (×2): qty 2
  Filled 2021-07-29: qty 1
  Filled 2021-07-29 (×4): qty 2
  Filled 2021-07-29 (×2): qty 1
  Filled 2021-07-29 (×3): qty 2
  Filled 2021-07-29: qty 1

## 2021-07-29 MED ORDER — LOPERAMIDE HCL 2 MG PO CAPS
2.0000 mg | ORAL_CAPSULE | Freq: Two times a day (BID) | ORAL | Status: AC | PRN
  Administered 2021-07-31 – 2021-08-06 (×3): 2 mg via ORAL
  Filled 2021-07-29 (×3): qty 1

## 2021-07-29 MED ORDER — ACETAMINOPHEN 500 MG PO TABS
500.0000 mg | ORAL_TABLET | Freq: Four times a day (QID) | ORAL | Status: DC
  Administered 2021-07-29 – 2021-08-06 (×27): 500 mg via ORAL
  Filled 2021-07-29 (×28): qty 1

## 2021-07-29 MED ORDER — FERROUS SULFATE 325 (65 FE) MG PO TABS
325.0000 mg | ORAL_TABLET | Freq: Two times a day (BID) | ORAL | Status: DC
  Administered 2021-07-29 – 2021-08-06 (×16): 325 mg via ORAL
  Filled 2021-07-29 (×15): qty 1

## 2021-07-29 NOTE — Progress Notes (Signed)
?   ? ?[facility] ? ?Date of Admission:  07/23/2021    ?       ?Reason for visit: Follow up on discitis/osteomyelitis ?  ?Current antibiotics: ?Ampicillin sulbactam 4/2--present ?Vancomycin p.o. 3/31-present ?  ?Previous antibiotics: ?Daptomycin 3/31-4/2 ?Ceftriaxone 3/31-4/3 ? ? ?ASSESSMENT:   ? ?88 y.o. female admitted with: ? ?Discitis/osteomyelitis L5-S1 secondary to Enterococcus faecalis: Infection initially noted on MRI without contrast 3/30 after several weeks of worsening back pain.  Follow-up MRI with contrast 3/31 noted a 4 mm epidural phlegmon without drainable component.  Status post IR disc aspiration 3/31 with cultures finalized as Enterococcus faecalis.  TEE 4/3 was negative for subacute endocarditis. ?Intra-abdominal fluid collections: Nearly resolved loculated collections on repeat CT scan 3/31. ?History of recurrent C. difficile: Currently on oral vancomycin prophylaxis. ?CKD stage III: Stable. ?Recent rectal prolapse surgery: Completed on 05/26/2021. ? ?RECOMMENDATIONS:   ? ?Continue ampicillin-sulbactam ?Pain control ?Therapy recommendations ?Continue to monitor back pain.  If no improvement with anti-inflammatories over the next week or so, anticipate likely repeat MRI to ensure no abscess development from prior 4 mm phlegmon noted ?Will follow ? ? ?Principal Problem: ?  Lumbar discitis ?Active Problems: ?  Hypothyroidism ?  HLD (hyperlipidemia) ?  Hypertension, essential ?  Valvular heart disease ?  Stage 3a chronic kidney disease (CKD) ([place]) ?  Right-sided low back pain with right-sided sciatica ?  New onset a-fib [facility]) ?  Disc disorder ? ? ? ?MEDICATIONS:   ? ?Scheduled Meds: ? apixaban  5 mg Oral BID  ? brimonidine  1 drop Both Eyes Daily  ? Chlorhexidine Gluconate Cloth  6 each Topical Daily  ? clonazePAM  0.5 mg Oral QHS  ? gabapentin  400 mg Oral TID  ? latanoprost  1 drop Both Eyes QHS  ? levothyroxine  100 mcg Oral Daily  ? magnesium oxide  800 mg Oral BID  ?  metoprolol tartrate  12.5 mg Oral BID  ? naproxen  375 mg Oral BID WC  ? sertraline  100 mg Oral q AM  ? sodium chloride flush  10-40 mL Intracatheter Q12H  ? vancomycin  125 mg Oral QHS  ? ?Continuous Infusions: ? ampicillin-sulbactam (UNASYN) IV 3 g (07/29/21 0603)  ? ?PRN Meds:.acetaminophen **OR** [DISCONTINUED] acetaminophen, albuterol, Gerhardt's butt cream, HYDROcodone-acetaminophen, hydrOXYzine, morphine injection, ondansetron **OR** ondansetron (ZOFRAN) IV, sodium chloride flush ? ?SUBJECTIVE:  ? ?24 hour events:  ?There are no acute events noted overnight ?Afebrile, Tmax 98.1 ?Therapy recommending inpatient rehab ?Patient continues on Unasyn ?Labs are stable today ?No new micro ?Repeat chest x-ray obtained this morning shows persistent bibasilar atelectasis ? ?Continues to have back pain particularly with sitting up on the toilet.  No significant improvement.  Denies any fevers or chills.  She reports some loose bowel movements this morning. ? ?Review of Systems  ?All other systems reviewed and are negative. ? ?  ?OBJECTIVE:  ? ?Blood pressure (!) 158/61, pulse 68, temperature 97.6 ?F (36.4 ?C), temperature source Oral, resp. rate 17, height '5\' 1"'$  (1.549 m), weight 71.6 kg, SpO2 98 %. ?Body mass index is 29.83 kg/m?. ? ?Physical Exam ?Constitutional:   ?   General: She is not in acute distress. ?   Appearance: Normal appearance.  ?HENT:  ?   Head: Normocephalic and atraumatic.  ?Eyes:  ?   Extraocular Movements: Extraocular movements intact.  ?   Conjunctiva/sclera: Conjunctivae normal.  ?Pulmonary:  ?   Effort: Pulmonary effort is normal. No respiratory distress.  ?  Abdominal:  ?   General: There is no distension.  ?   Palpations: Abdomen is soft.  ?   Tenderness: There is no abdominal tenderness.  ?Musculoskeletal:     ?   General: Normal range of motion.  ?   Cervical back: Normal range of motion and neck supple.  ?Skin: ?   General: Skin is warm and dry.  ?   Findings: No rash.  ?Neurological:  ?    General: No focal deficit present.  ?   Mental Status: She is alert. Mental status is at baseline.  ?Psychiatric:     ?   Mood and Affect: Mood normal.     ?   Behavior: Behavior normal.  ? ? ? ?Lab Results: ?Lab Results  ?Component Value Date  ? WBC 8.4 07/29/2021  ? HGB 10.2 (L) 07/29/2021  ? HCT 32.6 (L) 07/29/2021  ? MCV 89.8 07/29/2021  ? PLT 289 07/29/2021  ?  ?Lab Results  ?Component Value Date  ? NA 135 07/29/2021  ? K 4.2 07/29/2021  ? CO2 27 07/29/2021  ? GLUCOSE 95 07/29/2021  ? BUN 16 07/29/2021  ? CREATININE 0.93 07/29/2021  ? CALCIUM 8.8 (L) 07/29/2021  ? GFRNONAA 59 (L) 07/29/2021  ? GFRAA 58 (L) 03/28/2019  ?  ?Lab Results  ?Component Value Date  ? ALT 13 07/27/2021  ? AST 17 07/27/2021  ? ALKPHOS 77 07/27/2021  ? BILITOT 0.1 (L) 07/27/2021  ? ? ?   ?Component Value Date/Time  ? CRP 5.0 (H) 07/23/2021 1555  ? ? ?   ?Component Value Date/Time  ? ESRSEDRATE 48 (H) 07/23/2021 1555  ? ?  ?I have reviewed the micro and lab results in Epic. ? ?Imaging: ?DG Chest 2 View ? ?Result Date: 07/29/2021 ?CLINICAL DATA:  Chest pain. EXAM: CHEST - 2 VIEW COMPARISON:  07/27/2021 FINDINGS: The heart is mildly enlarged but stable. Stable tortuosity and calcification of the thoracic aorta. The right PICC line tip is in the mid SVC. Persistent bibasilar atelectasis or infiltrates and small left pleural effusion. IMPRESSION: Persistent bibasilar atelectasis or infiltrates and small left effusion. Electronically Signed   By: Marijo Sanes M.D.   On: 07/29/2021 07:41  ? ?DG CHEST PORT 1 VIEW ? ?Result Date: 07/27/2021 ?CLINICAL DATA:  PICC placement EXAM: PORTABLE CHEST 1 VIEW COMPARISON:  05/06/2021, 07/11/2018 FINDINGS: Left shoulder replacement. Right upper extremity central venous catheter tip over the SVC. Right lung grossly clear. Cardiomegaly with possible small left effusion. Airspace disease at the left base. Mild diffuse bronchitic changes. Aortic atherosclerosis. Scoliosis of the spine. IMPRESSION: 1. Right upper  extremity central venous catheter tip overlies the SVC. 2. Small left effusion with airspace disease at the left base which may be due to atelectasis or pneumonia. Cardiomegaly. Electronically Signed   By: Donavan Foil M.D.   On: 07/27/2021 23:00  ? ?ECHO TEE ? ?Result Date: 07/28/2021 ?   TRANSESOPHOGEAL ECHO REPORT   Patient Name:   [facility] Date of Exam: 07/27/2021 Medical Rec #:  [id number]              Height:       61.0 in Accession #:    [id number]             Weight:       155.0 lb Date of Birth:  [birth year]              BSA:          1.695  m? Patient Age:    48 years               BP:           130/79 mmHg Patient Gender: F                      HR:           96 bpm. Exam Location:  Inpatient Procedure: Transesophageal Echo, Color Doppler, Cardiac Doppler and Saline            Contrast Bubble Study Indications:     Bacteremia  History:         Patient has prior history of Echocardiogram examinations, most                  recent 07/24/2021. Risk Factors:Dyslipidemia and Hypertension.  Sonographer:     Bernadene Person RDCS Referring Phys:  [place] Diagnosing Phys: Dixie Dials MD PROCEDURE: After discussion of the risks and benefits of a TEE, an informed consent was obtained from the patient. The transesophogeal probe was passed without difficulty through the esophogus of the patient. Sedation performed by different physician. The patient was monitored while under deep sedation. Anesthestetic sedation was provided intravenously by Anesthesiology: 166.'09mg'$  of Propofol. The patient's vital signs; including heart rate, blood pressure, and oxygen saturation; remained stable throughout the procedure. The patient developed no complications during the procedure. IMPRESSIONS  1. Left ventricular ejection fraction, by estimation, is 55 to 60%. The left ventricle has normal function.  2. Right ventricular systolic function is normal. The right ventricular size is normal.  3. Left atrial size was  moderately dilated. No left atrial/left atrial appendage thrombus was detected.  4. Right atrial size was mildly dilated.  5. The mitral valve is degenerative. Moderate mitral valve regurgitation. There is moderat

## 2021-07-29 NOTE — Progress Notes (Signed)
Physical Therapy Treatment ?Patient Details ?Name: Brooke Beasley ?MRN: [id number] ?DOB: 06/30/1930 ?Today's Date: 07/29/2021 ? ? ?History of Present Illness Pt is an 88 y.o. female who presented 07/23/21 with a 2 month hx of increasing low back pain with radiation down her R leg. Per chart, her symptoms began after she underwent rectal prolapse surgery in January of 2023. MRI was concerning for L5-S1 discitis/osteomyelitis. S/p L5-S1 disc aspiration 3/31. PMH: carotid stenosis, CKD, diverticulosis, glaucoma, heart murmur, HTN, HLD, hypothyroidism, pseudogout, osteoporosis ? ?  ?PT Comments  ? ? Patient limited by pain and diarrhea.  Able to ambulate in the room, though in significant pain the whole time.  Discussed with her friend, Brooke Beasley, likely not able to tolerate inpatient rehab.  Feel she will do better with in home PT and family support.  Admissions coordinator reports insurance denied rehab.  PT will continue to follow as pt able to tolerate.    ?Recommendations for follow up therapy are one component of a multi-disciplinary discharge planning process, led by the attending physician.  Recommendations may be updated based on patient status, additional functional criteria and insurance authorization. ? ?Follow Up Recommendations ? Home health PT ?  ?  ?Assistance Recommended at Discharge Frequent or constant Supervision/Assistance  ?Patient can return home with the following A lot of help with walking and/or transfers;Direct supervision/assist for medications management;Assistance with cooking/housework;A lot of help with bathing/dressing/bathroom;Assist for transportation;Help with stairs or ramp for entrance ?  ?Equipment Recommendations ? None recommended by PT  ?  ?Recommendations for Other Services   ? ? ?  ?Precautions / Restrictions Precautions ?Precautions: Fall ?Precaution Comments: back precautions for comfort  ?  ? ?Mobility ? Bed Mobility ?Overal bed mobility: Needs Assistance ?Bed Mobility:  Rolling, Sidelying to Sit ?Rolling: Supervision ?Sidelying to sit: Min assist ?  ?Sit to supine: Min assist ?  ?General bed mobility comments: rolling on her own, assist for lifting trunk due to pain ?  ? ?Transfers ?Overall transfer level: Needs assistance ?Equipment used: Rolling walker (2 wheels) ?Transfers: Sit to/from Stand ?Sit to Stand: Min guard ?  ?Step pivot transfers: Min assist ?  ?  ?  ?General transfer comment: up and ambulated to window, then step pivot to [facility] due to incontinent of bowel wearing depends ?  ? ?Ambulation/Gait ?Ambulation/Gait assistance: Min assist ?Gait Distance (Feet): 12 Feet ?Assistive device: Rolling walker (2 wheels) ?Gait Pattern/deviations: Step-to pattern, Step-through pattern ?  ?  ?  ?General Gait Details: c/o pain throughout and needing assist for balance, states better off if she were to die than deal with the pain ? ? ?Stairs ?  ?  ?  ?  ?  ? ? ?Wheelchair Mobility ?  ? ?Modified Rankin (Stroke Patients Only) ?  ? ? ?  ?Balance Overall balance assessment: Needs assistance ?  ?Sitting balance-Leahy Scale: Fair ?Sitting balance - Comments: prefers to lean to L in sitting to avoid pain R sciatic distribution ?  ?Standing balance support: Bilateral upper extremity supported ?Standing balance-Leahy Scale: Poor ?Standing balance comment: minguard with UE support for hygiene after fecal incontinence ?  ?  ?  ?  ?  ?  ?  ?  ?  ?  ?  ?  ? ?  ?Cognition Arousal/Alertness: Awake/alert ?Behavior During Therapy: Anxious ?Overall Cognitive Status: Within Functional Limits for tasks assessed ?  ?  ?  ?  ?  ?  ?  ?  ?  ?  ?  ?  ?  ?  ?  ?  ?  General Comments: Distracted by pain often, but otherwise [facility] for tasks assessed. ?  ?  ? ?  ?Exercises Other Exercises ?Other Exercises: supine figure 4 stretch for piriformis 2 x 20 sec ?Other Exercises: nerve gliding technique in supine x 5 reps (AP, heel slide, knee extension, then AP with leg extended) ? ?  ?General Comments General comments  (skin integrity, edema, etc.): friend, Brooke Beasley, present and supportive, attempted to call daughter who did not answer, left message would call back to discuss rehab, but rehab coordinator reports insurance denied. ?  ?  ? ?Pertinent Vitals/Pain Pain Assessment ?Faces Pain Scale: Hurts whole lot ?Pain Location: back, R leg ?Pain Descriptors / Indicators: Discomfort, Grimacing, Guarding, Tingling ?Pain Intervention(s): Monitored during session, Repositioned, Limited activity within patient's tolerance, Heat applied  ? ? ?Home Living   ?  ?  ?  ?  ?  ?  ?  ?  ?  ?   ?  ?Prior Function    ?  ?  ?   ? ?PT Goals (current goals can now be found in the care plan section) Progress towards PT goals: Progressing toward goals (slowly due to pain) ? ?  ?Frequency ? ? ? Min 3X/week ? ? ? ?  ?PT Plan Discharge plan needs to be updated  ? ? ?Co-evaluation   ?  ?  ?  ?  ? ?  ?AM-PAC PT "6 Clicks" Mobility   ?Outcome Measure ? Help needed turning from your back to your side while in a flat bed without using bedrails?: A Little ?Help needed moving from lying on your back to sitting on the side of a flat bed without using bedrails?: A Little ?Help needed moving to and from a bed to a chair (including a wheelchair)?: A Little ?Help needed standing up from a chair using your arms (e.g., wheelchair or bedside chair)?: A Little ?Help needed to walk in hospital room?: Total ?Help needed climbing 3-5 steps with a railing? : Total ?6 Click Score: 14 ? ?  ?End of Session Equipment Utilized During Treatment: Gait belt ?Activity Tolerance: Patient limited by pain ?Patient left: in bed;with call bell/phone within reach;with family/visitor present ?  ?PT Visit Diagnosis: Unsteadiness on feet (R26.81);Other abnormalities of gait and mobility (R26.89);Muscle weakness (generalized) (M62.81);Difficulty in walking, not elsewhere classified (R26.2);Pain ?Pain - Right/Left: Right ?Pain - part of body: Leg ?  ? ? ?Time: 3976-7341 ?PT Time Calculation (min)  (ACUTE ONLY): 24 min ? ?Charges:  $Therapeutic Exercise: 8-22 mins ?$Therapeutic Activity: 8-22 mins          ?          ? ?Brooke Beasley, PT ?Acute Rehabilitation Services ?PFXTK:[phone] ?Office:[phone] ?07/29/2021 ? ? ? ?Brooke Beasley ?07/29/2021, 1:10 PM ? ?

## 2021-07-29 NOTE — Progress Notes (Signed)
Pharmacy Antibiotic Note ? ?Brooke Beasley is a 88 y.o. female admitted on 07/23/2021 with concern for subacute bacterial endocarditis.  Pharmacy has been consulted for Unasyn dosing. ID is following patient. ? ?Plan: ?Continue Unasyn 3g q6h thru 09/03/21, per ID recs ?Continue PO vancomycin '125mg'$  QHS thru 09/08/21 ? ?Height: '5\' 1"'$  (154.9 cm) ?Weight: 71.6 kg (157 lb 13.6 oz) ?IBW/kg (Calculated) : 47.8 ? ?Temp (24hrs), Avg:97.8 ?F (36.6 ?C), Min:97.6 ?F (36.4 ?C), Max:98.1 ?F (36.7 ?C) ? ?Recent Labs  ?Lab 07/24/21 ?3762 07/25/21 ?8315 07/26/21 ?1761 07/27/21 ?6073 07/28/21 ?7106 07/29/21 ?0356  ?WBC 16.1* 15.6* 15.1*  --  10.1 8.4  ?CREATININE 1.04* 1.05* 0.94 0.77 0.78 0.93  ? ?  ?Estimated Creatinine Clearance: 37.8 mL/min (by C-G formula based on SCr of 0.93 mg/dL).   ? ?Allergies  ?Allergen Reactions  ? Amlodipine Other (See Comments)  ?  Stomach pains--"feel like my insides are on fire"  ? Ace Inhibitors Cough  ? Carvedilol Other (See Comments)  ?  fatigue  ? Losartan Other (See Comments)  ?  Cr bumped  ? Other Other (See Comments)  ?  No seeds or corn because of IBS  ? Paroxetine Hcl Other (See Comments)  ?  Not effective - felt ill on this medicine  ? Risedronate Sodium   ?  REACTION: joint pain  ? Simvastatin   ?  REACTION: the full 20 mg pill causes leg pain- can tol 10 mg  ? Toprol Xl [Metoprolol] Diarrhea  ?  Diarrhea and weakness  ? Alendronate Sodium Palpitations  ? Influenza Vaccines Rash and Other (See Comments)  ?  Led to mental breakdown in 22s ?  ? Silenor [Doxepin Hcl] Palpitations  ?  Hallucinations and racing heart  ? Sulfonamide Derivatives Rash  ? ? ?Antimicrobials this admission: ?Daptomycin 3/31 >> 4/01 ?Ceftriaxone 3/31 >> 4/3 ?Vanc PO 3/31 >> (5/16) ?Unasyn 4/2 >> (5/11) ? ? ?Microbiology results: ?3/30 BCx: NGTD ?3/31 BCx: NGTD ?3/31 Intervertebral fluid Cx: E. Faecalis  ?3/30 MRSA PCR: negative ? ? ?Thank you for allowing pharmacy to be a part of this patient?s care. ? ?Luisa Hart, PharmD, BCPS ?Clinical Pharmacist ?07/29/2021 3:20 PM  ? ?Please refer to [facility] for pharmacy phone number  ?

## 2021-07-29 NOTE — TOC Progression Note (Addendum)
Transition of Care (TOC) - Progression Note  ? ? ?Patient Details  ?Name: Brooke Beasley ?MRN: [id number] ?Date of Birth: 04/27/1930 ? ?Transition of Care (TOC) CM/SW Contact  ?Angelita Ingles, RN ?Phone [phone] ? ?07/29/2021, 9:32 AM ? ?Clinical Narrative:    ?TOC acknowledges consult for back up plan if CIR is not an option. Case manager has updated Education officer, museum. Patient is new to Eliquis. CM has requested a benefits check. TOC will continue to follow.  ? ?0941 Benefits check complete. Patient has $45.00 copay for Eliquis.  ? ? ?  ?  ? ?Expected Discharge Plan and Services ?  ?  ?  ?  ?  ?                ?  ?  ?  ?  ?  ?  ?  ?  ?  ?  ? ? ?Social Determinants of Health (SDOH) Interventions ?  ? ?Readmission Risk Interventions ? ?  05/29/2021  ? 11:32 AM  ?Readmission Risk Prevention Plan  ?Transportation Screening Complete  ?PCP or Specialist Appt within 3-5 Days Complete  ?[place] or Home Care Consult Complete  ?Palliative Care Screening Not Applicable  ? ? ?

## 2021-07-29 NOTE — TOC Benefit Eligibility Note (Signed)
Patient Advocate Encounter  Insurance verification completed.    The patient is currently admitted and upon discharge could be taking Eliquis 5 mg.  The current 30 day co-pay is, $45.00.   The patient is insured through [employer] Part D     Travanti Mcmanus, CPhT Pharmacy Patient Advocate Specialist [facility] Patient Advocate Team Direct Number: [phone]  Fax: [phone]        

## 2021-07-29 NOTE — Progress Notes (Signed)
Inpatient Rehabilitation Admissions Coordinator  ? ?Insurance has denied Cir admit. I met with patient at bedside with her friend, Fraser Din and spoke to her daughter by phone. They do not want to pursue SNF. Request for home with [facility] when medically ready for discharge. I have alerted acute team and TOC. We will sign off at this time. ? ?Danne Baxter, RN, MSN ?Rehab Admissions Coordinator ?[phone] ?07/29/2021 12:14 PM ? ?

## 2021-07-29 NOTE — Progress Notes (Signed)
?PROGRESS NOTE ? ?Brooke Beasley  ?DOB: October 15, 1930  ?PCP: [name], MD ?YBO:[id number]  ?DOA: 07/23/2021 ? LOS: 5 days  ?Hospital Day: 7 ? ?Brief narrative: ?Brooke Beasley is a 88 y.o. female with PMH significant for HTN, HLD, carotid stenosis, CKD 3, recurrent C. difficile infection, anxiety/depression, hiatal hernia, GERD, hypothyroidism, osteoarthritis who had multiple prior admissions for syncope with falls and fractures. ?07/13/2021, patient was seen at the PCPs office with 2 months of intermittent discomfort and low back pain worsening over a period of 2 weeks.  With the suspicion of right sciatica, she was given steroid taper and as needed hydrocodone.  She also recently completed 2 weeks course of oral Augmentin. ? ?07/23/2021, because of unrelenting pain, outpatient MRI was performed which showed phlegmon versus abscess L5-S1 with T1 abnormality  ?Patient was sent to the ED ? ?In the ED, patient was in new onset A-fib ?Blood culture was drawn, patient was started on broad-spectrum antibiotics. ?Admitted to hospitalist service.  ID was consulted. ?3/31, follow-up MRI with contrast showed a 4 mm epidural phlegmon without drainable component. ?3/31, underwent disc aspiration that grew Enterococcus.  Following CT scan showed decrease in the size of seroma. ?4/2, started endocarditis coverage with Unasyn ?4/3, TEE negative for endocarditis antibiotics adjusted by ID, PICC line placed by vascular access team ? ?Subjective: ?Patient was seen and examined this morning.  Elderly Caucasian female.  Lying down in bed.  Not in distress.  She started having loose bowel movement since early this morning. ?Not on stool softeners. ? ?Principal Problem: ?  Lumbar discitis ?Active Problems: ?  Right-sided low back pain with right-sided sciatica ?  New onset a-fib [facility]) ?  Valvular heart disease ?  Hypothyroidism ?  HLD (hyperlipidemia) ?  Hypertension, essential ?  Stage 3a chronic kidney disease (CKD)  ([place]) ?  Disc disorder ?  ? 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   ?-Status post IR guided disc aspiration 3/31---fluid culture grew Enterococcus.  TEE negative for endocarditis. ?-Currently only Unasyn --> PICC line placed 4/3-stop date antibiotics 09/03/2021 (6 weeks) ?-Pain control with Norco 08/27/2023 q4 prn, mild pain Tylenol I will stop naproxen.  Continue gabapentin 3 times daily ?IV morphine on board 1 mg every 3 as needed severe pain ? ?Diarrhea ?Prior recurrent C. Difficile requiring prolonged prior vancomycin taper plus Zinplava 04/2019- ?-This morning, patient complained of 4-5 episodes of loose bowel movement since early in the morning.  No fever.  WBC count normal.  Continue to monitor ?-Patient is currently on secondary C. difficile prophylaxis with vancomycin 125 p.o. daily till 5/17 ? ?New onset A-fib ?-Likely due to infection.  Currently on metoprolol 12.5 mg twice daily ?-CHA2DS2-VASc score=5. Lovenox transitioned to Eliquis 4/3--have reached out to [facility] to make sure this is affordable ?-Outpatient follow-up Dr. [name] for aortic valve stenosis as needed ? ?Subclinical Hypothyroid ?-T4 is 0.77 with elevated TSH --> subclinical hypothyroidism-would not adjust thyroxine when acutely ill-recheck TFT  3 to 4 weeks  ?Continue 100 mcg dose ? ?CKD 3B ?-Continue to hold irbesartan, Lasix, Aldactone ? ?Neuropathy  ?-Continue gabapentin 400 tid--pain is much improved with this ?Monitor mentation carefully ? ?Anxiety ?-Klonopin 0.5 at bedtime and Zoloft in the morning ? ?Goals of care ?  Code Status: DNR  ? ? ?Mobility: PT eval obtained.  Home with PT recommended. ? ?Nutritional status:  ?Body mass index is 29.83 kg/m?.  ?  ?  ? ? ? ? ?  Diet:  ?Diet Order   ? ?       ?  Diet regular Room service appropriate? Yes; Fluid consistency: Thin  Diet effective now       ?  ? ?  ?  ? ?  ? ? ?DVT prophylaxis:  ?SCDs Start: 07/23/21 2133 ?apixaban (ELIQUIS)  tablet 5 mg   ?Antimicrobials: IV Unasyn ?Fluid: None ?Consultants: ID ?Family Communication: Family at bedside ? ?Status is: inpatient ? ?Continue in-hospital care because: Monitor diarrhea for 4 hours.  If improves, will consider discharge to home with home health PT tomorrow. ?Level of care: Telemetry Surgical  ? ?Dispo: The patient is from: Home ?             Anticipated d/c is to: CIR denied by insurance.  Home with home health PT likely ?             Patient currently is not medically stable to d/c. ?  Difficult to place patient No ? ? ? ? ?Infusions:  ? ampicillin-sulbactam (UNASYN) IV 3 g (07/29/21 1159)  ? ? ?Scheduled Meds: ? apixaban  5 mg Oral BID  ? brimonidine  1 drop Both Eyes Daily  ? Chlorhexidine Gluconate Cloth  6 each Topical Daily  ? clonazePAM  0.5 mg Oral QHS  ? gabapentin  400 mg Oral TID  ? latanoprost  1 drop Both Eyes QHS  ? levothyroxine  100 mcg Oral Daily  ? magnesium oxide  800 mg Oral BID  ? metoprolol tartrate  12.5 mg Oral BID  ? sertraline  100 mg Oral q AM  ? sodium chloride flush  10-40 mL Intracatheter Q12H  ? vancomycin  125 mg Oral QHS  ? ? ?PRN meds: ?acetaminophen **OR** [DISCONTINUED] acetaminophen, albuterol, Gerhardt's butt cream, HYDROcodone-acetaminophen, loperamide, melatonin, morphine injection, ondansetron **OR** ondansetron (ZOFRAN) IV, sodium chloride flush  ? ?Antimicrobials: ?Anti-infectives (From admission, onward)  ? ? Start     Dose/Rate Route Frequency Ordered Stop  ? 07/26/21 2200  cefTRIAXone (ROCEPHIN) 2 g in sodium chloride 0.9 % 100 mL IVPB  Status:  Discontinued       ? 2 g ?200 mL/hr over 30 Minutes Intravenous Every 12 hours 07/26/21 1219 07/27/21 1043  ? 07/26/21 1200  Ampicillin-Sulbactam (UNASYN) 3 g in sodium chloride 0.9 % 100 mL IVPB       ? 3 g ?200 mL/hr over 30 Minutes Intravenous Every 6 hours 07/26/21 1056    ? 07/24/21 2200  vancomycin (VANCOCIN) capsule 125 mg       ? 125 mg Oral Daily at bedtime 07/24/21 1609    ? 07/24/21 2100   cefTRIAXone (ROCEPHIN) 2 g in sodium chloride 0.9 % 100 mL IVPB  Status:  Discontinued       ? 2 g ?200 mL/hr over 30 Minutes Intravenous Every 24 hours 07/24/21 1609 07/26/21 1219  ? 07/24/21 2000  DAPTOmycin (CUBICIN) 600 mg in sodium chloride 0.9 % IVPB  Status:  Discontinued       ? 8 mg/kg ? 72.6 kg ?124 mL/hr over 30 Minutes Intravenous Daily 07/24/21 1609 07/26/21 1056  ? 07/24/21 1800  vancomycin (VANCOCIN) 50 mg/mL oral solution SOLN 125 mg  Status:  Discontinued       ? 125 mg Oral Every 6 hours 07/24/21 1607 07/24/21 1609  ? 07/24/21 1700  cefTRIAXone (ROCEPHIN) 1 g in sodium chloride 0.9 % 100 mL IVPB  Status:  Discontinued       ? 1 g ?200  mL/hr over 30 Minutes Intravenous Every 24 hours 07/24/21 1607 07/24/21 1609  ? ?  ? ? ?Objective: ?Vitals:  ? 07/29/21 0809 07/29/21 1143  ?BP: (!) 158/61 92/64  ?Pulse: 68 65  ?Resp: 17 16  ?Temp: 97.6 ?F (36.4 ?C) 97.8 ?F (36.6 ?C)  ?SpO2: 98% 95%  ? ? ?Intake/Output Summary (Last 24 hours) at 07/29/2021 1406 ?Last data filed at 07/29/2021 0506 ?Gross per 24 hour  ?Intake 1114.12 ml  ?Output 1600 ml  ?Net -485.88 ml  ? ?Filed Weights  ? 07/25/21 0500 07/27/21 0809 07/28/21 0500  ?Weight: 70.3 kg 72.6 kg 71.6 kg  ? ?Weight change:  ?Body mass index is 29.83 kg/m?.  ? ?Physical Exam: ?General exam: Pleasant, elderly Caucasian female.  Not in visible distress. ?Skin: No rashes, lesions or ulcers. ?HEENT: Atraumatic, normocephalic, no obvious bleeding ?Lungs: Clear to auscultation bilaterally ?CVS: Regular rate and rhythm, no murmur ?GI/Abd soft, nontender, nondistended, bowel sound present ?CNS: Alert, awake, oriented X 3 ?Psychiatry: Mood appropriate ?Extremities: No pedal edema, no calf ? ?Data Review: I have personally reviewed the laboratory data and studies available. ? ?F/u labs ordered ?Unresulted Labs (From admission, onward)  ? ?  Start     Ordered  ? 07/30/21 0500  CBC with Differential/Platelet  Tomorrow morning,   R       ?Question:  Specimen collection method   Answer:  Lab=Lab collect  ? 07/29/21 1406  ? 07/30/21 4163  Basic metabolic panel  Tomorrow morning,   R       ?Question:  Specimen collection method  Answer:  Lab=Lab collect  ? 07/29/21 1406  ? 07/28/21 05

## 2021-07-30 DIAGNOSIS — N1831 Chronic kidney disease, stage 3a: Secondary | ICD-10-CM | POA: Diagnosis not present

## 2021-07-30 DIAGNOSIS — M4646 Discitis, unspecified, lumbar region: Secondary | ICD-10-CM | POA: Diagnosis not present

## 2021-07-30 LAB — BASIC METABOLIC PANEL
Anion gap: 6 (ref 5–15)
BUN: 12 mg/dL (ref 8–23)
CO2: 27 mmol/L (ref 22–32)
Calcium: 8.5 mg/dL — ABNORMAL LOW (ref 8.9–10.3)
Chloride: 104 mmol/L (ref 98–111)
Creatinine, Ser: 0.85 mg/dL (ref 0.44–1.00)
GFR, Estimated: >60 mL/min (ref >60)
Glucose, Bld: 92 mg/dL (ref 70–99)
Potassium: 4.1 mmol/L (ref 3.5–5.1)
Sodium: 137 mmol/L (ref 135–145)

## 2021-07-30 LAB — CBC WITH DIFFERENTIAL/PLATELET
Abs Immature Granulocytes: 0.04 10*3/uL (ref 0.00–0.07)
Basophils Absolute: 0 10*3/uL (ref 0.0–0.1)
Basophils Relative: 0 %
Eosinophils Absolute: 0.5 10*3/uL (ref 0.0–0.5)
Eosinophils Relative: 5 %
HCT: 30.3 % — ABNORMAL LOW (ref 36.0–46.0)
Hemoglobin: 9.6 g/dL — ABNORMAL LOW (ref 12.0–15.0)
Immature Granulocytes: 0 %
Lymphocytes Relative: 14 %
Lymphs Abs: 1.2 10*3/uL (ref 0.7–4.0)
MCH: 28.6 pg (ref 26.0–34.0)
MCHC: 31.7 g/dL (ref 30.0–36.0)
MCV: 90.2 fL (ref 80.0–100.0)
Monocytes Absolute: 0.9 10*3/uL (ref 0.1–1.0)
Monocytes Relative: 9 %
Neutro Abs: 6.6 10*3/uL (ref 1.7–7.7)
Neutrophils Relative %: 72 %
Platelets: 237 10*3/uL (ref 150–400)
RBC: 3.36 MIL/uL — ABNORMAL LOW (ref 3.87–5.11)
RDW: 17.5 % — ABNORMAL HIGH (ref 11.5–15.5)
WBC: 9.2 10*3/uL (ref 4.0–10.5)
nRBC: 0 % (ref 0.0–0.2)

## 2021-07-30 LAB — C-REACTIVE PROTEIN: CRP: 9.5 mg/dL — ABNORMAL HIGH (ref <1.0)

## 2021-07-30 LAB — SEDIMENTATION RATE: Sed Rate: 81 mm/hr — ABNORMAL HIGH (ref 0–22)

## 2021-07-30 NOTE — Progress Notes (Addendum)
?   ? ?[facility] for Infectious Disease ? ?Date of Admission:  07/23/2021    ?       ?Reason for visit: Follow up on discitis/OM ? ?Current antibiotics: ?Ampicillin sulbactam 4/2--present ?Vancomycin p.o. 3/31-present ?  ?Previous antibiotics: ?Daptomycin 3/31-4/2 ?Ceftriaxone 3/31-4/3 ?  ? ?ASSESSMENT:   ? ?88 y.o. female admitted with: ? ?Discitis/osteomyelitis L5-S1 secondary to Enterococcus faecalis: Infection initially noted on MRI without contrast 3/30 after several weeks of worsening back pain.  Follow-up MRI with contrast 3/31 noted a 4 mm epidural phlegmon without drainable component.  Status post IR disc aspiration 3/31 with cultures finalized as Enterococcus faecalis.  TEE 4/3 was negative for subacute endocarditis. ?Intra-abdominal fluid collections: Nearly resolved loculated collections on repeat CT scan 3/31. ?History of recurrent C. difficile: Currently on oral vancomycin prophylaxis. ?CKD stage III: Stable. ?Recent rectal prolapse surgery: Completed on 05/26/2021. ? ?RECOMMENDATIONS:   ? ?Continue Unasyn ?She continues to report unimproved back pain and potentially some worsening unless she is lying still.  Will repeat inflammatory markers to assess trend. Based on those results will decide whether to proceed with repeat MRI sooner than later ?Check ESR/CRP ?Will discuss with primary ?Discussed with daughter over the phone ?Continue vancomycin PO for C diff prophylaxis ?Will follow ? ? ?Principal Problem: ?  Lumbar discitis ?Active Problems: ?  Hypothyroidism ?  HLD (hyperlipidemia) ?  Hypertension, essential ?  Valvular heart disease ?  Stage 3a chronic kidney disease (CKD) ([place]) ?  Right-sided low back pain with right-sided sciatica ?  New onset a-fib [facility]) ?  Disc disorder ? ? ? ?MEDICATIONS:   ? ?Scheduled Meds: ? acetaminophen  500 mg Oral QID  ? apixaban  5 mg Oral BID  ? brimonidine  1 drop Both Eyes Daily  ? Chlorhexidine Gluconate Cloth  6 each Topical Daily  ? clonazePAM  0.5 mg Oral  QHS  ? ferrous sulfate  325 mg Oral BID WC  ? gabapentin  400 mg Oral TID  ? latanoprost  1 drop Both Eyes QHS  ? levothyroxine  100 mcg Oral Daily  ? loperamide  2 mg Oral QHS  ? magnesium oxide  800 mg Oral BID  ? metoprolol tartrate  12.5 mg Oral BID  ? sertraline  100 mg Oral q AM  ? sodium chloride flush  10-40 mL Intracatheter Q12H  ? vancomycin  125 mg Oral QHS  ? ?Continuous Infusions: ? ampicillin-sulbactam (UNASYN) IV 3 g (07/30/21 0556)  ? ?PRN Meds:.albuterol, Gerhardt's butt cream, HYDROcodone-acetaminophen, loperamide, melatonin, morphine injection, ondansetron **OR** ondansetron (ZOFRAN) IV, sodium chloride flush ? ?SUBJECTIVE:  ? ?24 hour events:  ?No events noted ? ?Improved bowel movements ?Back pain no better and maybe worse particularly with movement ?No fevers, chills.  ? ?Review of Systems  ?All other systems reviewed and are negative. ? ?  ?OBJECTIVE:  ? ?Blood pressure (!) 153/83, pulse 74, temperature 98 ?F (36.7 ?C), temperature source Oral, resp. rate 18, height 5' 1" (1.549 m), weight 73.9 kg, SpO2 96 %. ?Body mass index is 30.78 kg/m?. ? ?Physical Exam ?Constitutional:   ?   General: She is not in acute distress. ?   Appearance: Normal appearance.  ?HENT:  ?   Head: Normocephalic and atraumatic.  ?Eyes:  ?   Extraocular Movements: Extraocular movements intact.  ?   Conjunctiva/sclera: Conjunctivae normal.  ?Pulmonary:  ?   Effort: Pulmonary effort is normal. No respiratory distress.  ?Abdominal:  ?   General: There is no  distension.  ?   Palpations: Abdomen is soft.  ?Musculoskeletal:  ?   Cervical back: Normal range of motion and neck supple.  ?   Right lower leg: No edema.  ?   Left lower leg: No edema.  ?Skin: ?   General: Skin is warm and dry.  ?   Findings: No rash.  ?Neurological:  ?   General: No focal deficit present.  ?   Mental Status: She is alert and oriented to person, place, and time. Mental status is at baseline.  ?Psychiatric:     ?   Mood and Affect: Mood normal.     ?    Behavior: Behavior normal.  ? ? ? ?Lab Results: ?Lab Results  ?Component Value Date  ? WBC 9.2 07/30/2021  ? HGB 9.6 (L) 07/30/2021  ? HCT 30.3 (L) 07/30/2021  ? MCV 90.2 07/30/2021  ? PLT 237 07/30/2021  ?  ?Lab Results  ?Component Value Date  ? NA 137 07/30/2021  ? K 4.1 07/30/2021  ? CO2 27 07/30/2021  ? GLUCOSE 92 07/30/2021  ? BUN 12 07/30/2021  ? CREATININE 0.85 07/30/2021  ? CALCIUM 8.5 (L) 07/30/2021  ? GFRNONAA >60 07/30/2021  ? GFRAA 58 (L) 03/28/2019  ?  ?Lab Results  ?Component Value Date  ? ALT 13 07/27/2021  ? AST 17 07/27/2021  ? ALKPHOS 77 07/27/2021  ? BILITOT 0.1 (L) 07/27/2021  ? ? ?   ?Component Value Date/Time  ? CRP 5.0 (H) 07/23/2021 1555  ? ? ?   ?Component Value Date/Time  ? ESRSEDRATE 48 (H) 07/23/2021 1555  ? ?  ?I have reviewed the micro and lab results in Epic. ? ?Imaging: ?DG Chest 2 View ? ?Result Date: 07/29/2021 ?CLINICAL DATA:  Chest pain. EXAM: CHEST - 2 VIEW COMPARISON:  07/27/2021 FINDINGS: The heart is mildly enlarged but stable. Stable tortuosity and calcification of the thoracic aorta. The right PICC line tip is in the mid SVC. Persistent bibasilar atelectasis or infiltrates and small left pleural effusion. IMPRESSION: Persistent bibasilar atelectasis or infiltrates and small left effusion. Electronically Signed   By: Marijo Sanes M.D.   On: 07/29/2021 07:41    ? ?Imaging independently reviewed in Epic.  ? ? ?Mignon Pine ?[place] for Infectious Disease ?[place] ?[phone] pager ?07/30/2021, 10:06 AM ? ?I have personally spent 50 minutes involved in face-to-face and non-face-to-face activities for this patient on the day of the visit. Professional time spent includes the following activities: Preparing to see the patient (review of tests), Obtaining and/or reviewing separately obtained history (admission/discharge record), Performing a medically appropriate examination and/or evaluation , Ordering medications/tests/procedures, referring and  communicating with other health care professionals, Documenting clinical information in the EMR, Independently interpreting results (not separately reported), Communicating results to the patient/family/caregiver, Counseling and educating the patient/family/caregiver and Care coordination (not separately reported).  ? ? ?

## 2021-07-30 NOTE — Progress Notes (Signed)
?PROGRESS NOTE ? ?Brooke Beasley  ?DOB: 04-14-1931  ?PCP: [name], MD ?OYD:[id number]  ?DOA: 07/23/2021 ? LOS: 6 days  ?Hospital Day: 8 ? ?Brief narrative: ?Brooke Beasley is a 88 y.o. female with PMH significant for HTN, HLD, carotid stenosis, CKD 3, recurrent C. difficile infection, anxiety/depression, hiatal hernia, GERD, hypothyroidism, osteoarthritis who had multiple prior admissions for syncope with falls and fractures. ?07/13/2021, patient was seen at the PCPs office with 2 months of intermittent discomfort and low back pain worsening over a period of 2 weeks.  With the suspicion of right sciatica, she was given steroid taper and as needed hydrocodone.  She also recently completed 2 weeks course of oral Augmentin. ? ?07/23/2021, because of unrelenting pain, outpatient MRI was performed which showed phlegmon versus abscess L5-S1 with T1 abnormality  ?Patient was sent to the ED ? ?In the ED, patient was in new onset A-fib ?Blood culture was drawn, patient was started on broad-spectrum antibiotics. ?Admitted to hospitalist service.  ID was consulted. ?3/31, follow-up MRI with contrast showed a 4 mm epidural phlegmon without drainable component. ?3/31, underwent disc aspiration that grew Enterococcus.  Following CT scan showed decrease in the size of seroma. ?4/2, started endocarditis coverage with Unasyn ?4/3, TEE negative for endocarditis antibiotics adjusted by ID, PICC line placed by vascular access team ? ?Subjective: ?Patient was seen and examined this morning.   ?Lying down in bed.  Diarrhea stopped.  Back pain persist. ?Patient's daughter at bedside.  She is wondering if the back pain is related to recent rectal prolapse surgery. ? ?Principal Problem: ?  Lumbar discitis ?Active Problems: ?  Right-sided low back pain with right-sided sciatica ?  New onset a-fib [facility]) ?  Valvular heart disease ?  Hypothyroidism ?  HLD (hyperlipidemia) ?  Hypertension, essential ?  Stage 3a chronic  kidney disease (CKD) ([place]) ?  Disc disorder ?  ? 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   ?-Status post IR guided disc aspiration 3/31---fluid culture grew Enterococcus.  TEE negative for endocarditis. ?-Currently only Unasyn --> PICC line placed 4/3-stop date antibiotics 09/03/2021 (6 weeks) ?-Pain control with Norco 08/27/2023 q4 prn, mild pain Tylenol I will stop naproxen.  Continue gabapentin 3 times daily ?-IV morphine on board 1 mg every 3 as needed severe pain ?-Patient continues to have pain.  Noted a plan from ID to repeat inflammatory markers today.  Based on the results, patient may need MRI repeated sooner than later. ? ?Diarrhea ?Patient has history of prior recurrent C. Difficile requiring prolonged prior vancomycin taper plus Zinplava 04/2019 ?-Currently on secondary C. difficile prophylaxis with vancomycin 125 p.o. daily till 5/17 ?-Patient complained of diarrhea yesterday morning.  Improved with 1 dose of Imodium. ? ?New onset A-fib ?-Likely due to infection.  Currently on metoprolol 12.5 mg twice daily ?-CHA2DS2-VASc score=5.  Currently on Eliquis. ?-Outpatient follow-up Dr. [name] for aortic valve stenosis as needed ? ?Subclinical Hypothyroid ?-Continue 100 mcg dose ? ?Essential hypertension  ?CKD 3B ?-Blood pressure controlled.  Continue to hold irbesartan, Lasix, Aldactone ?Recent Labs  ?  05/22/21 ?1008 05/27/21 ?0224 05/28/21 ?0237 05/28/21 ?0600 05/30/21 ?0518 07/23/21 ?1555 07/24/21 ?6720 07/25/21 ?9470 07/26/21 ?9628 07/27/21 ?3662 07/28/21 ?9476 07/29/21 ?5465 07/30/21 ?0354  ?BUN 29* 14  --   --   --  '23 15 13 12 12 12 16 12  '$ ?CREATININE 1.00 1.08*   < > 0.75 0.79 0.90 1.04* 1.05* 0.94 0.77 0.78 0.93 0.85  ? < > =  values in this interval not displayed.  ? ?Neuropathy  ?-Continue gabapentin 400 tid--pain is much improved with this ?Monitor mentation carefully ? ?Anxiety ?-Klonopin 0.5 at bedtime and Zoloft in the morning ? ?Goals  of care ?  Code Status: DNR  ? ? ?Mobility: PT eval obtained.  Home with PT recommended. ? ?Nutritional status:  ?Body mass index is 30.78 kg/m?.  ?  ?  ? ? ? ? ?Diet:  ?Diet Order   ? ?       ?  Diet regular Room service appropriate? Yes; Fluid consistency: Thin  Diet effective now       ?  ? ?  ?  ? ?  ? ? ?DVT prophylaxis:  ?SCDs Start: 07/23/21 2133 ?apixaban (ELIQUIS) tablet 5 mg   ?Antimicrobials: IV Unasyn ?Fluid: None ?Consultants: ID ?Family Communication: Family at bedside ? ?Status is: inpatient ? ?Continue in-hospital care because: Back pain not improving yet.   ?Level of care: Telemetry Surgical  ? ?Dispo: The patient is from: Home ?             Anticipated d/c is to: CIR denied by insurance.  Home with home health PT after clinical improvement ?             Patient currently is not medically stable to d/c. ?  Difficult to place patient No ? ? ? ? ?Infusions:  ? ampicillin-sulbactam (UNASYN) IV 3 g (07/30/21 1235)  ? ? ?Scheduled Meds: ? acetaminophen  500 mg Oral QID  ? apixaban  5 mg Oral BID  ? brimonidine  1 drop Both Eyes Daily  ? Chlorhexidine Gluconate Cloth  6 each Topical Daily  ? clonazePAM  0.5 mg Oral QHS  ? ferrous sulfate  325 mg Oral BID WC  ? gabapentin  400 mg Oral TID  ? latanoprost  1 drop Both Eyes QHS  ? levothyroxine  100 mcg Oral Daily  ? loperamide  2 mg Oral QHS  ? magnesium oxide  800 mg Oral BID  ? metoprolol tartrate  12.5 mg Oral BID  ? sertraline  100 mg Oral q AM  ? sodium chloride flush  10-40 mL Intracatheter Q12H  ? vancomycin  125 mg Oral QHS  ? ? ?PRN meds: ?albuterol, Gerhardt's butt cream, HYDROcodone-acetaminophen, loperamide, melatonin, morphine injection, ondansetron **OR** ondansetron (ZOFRAN) IV, sodium chloride flush  ? ?Antimicrobials: ?Anti-infectives (From admission, onward)  ? ? Start     Dose/Rate Route Frequency Ordered Stop  ? 07/26/21 2200  cefTRIAXone (ROCEPHIN) 2 g in sodium chloride 0.9 % 100 mL IVPB  Status:  Discontinued       ? 2 g ?200 mL/hr  over 30 Minutes Intravenous Every 12 hours 07/26/21 1219 07/27/21 1043  ? 07/26/21 1200  Ampicillin-Sulbactam (UNASYN) 3 g in sodium chloride 0.9 % 100 mL IVPB       ? 3 g ?200 mL/hr over 30 Minutes Intravenous Every 6 hours 07/26/21 1056    ? 07/24/21 2200  vancomycin (VANCOCIN) capsule 125 mg       ? 125 mg Oral Daily at bedtime 07/24/21 1609    ? 07/24/21 2100  cefTRIAXone (ROCEPHIN) 2 g in sodium chloride 0.9 % 100 mL IVPB  Status:  Discontinued       ? 2 g ?200 mL/hr over 30 Minutes Intravenous Every 24 hours 07/24/21 1609 07/26/21 1219  ? 07/24/21 2000  DAPTOmycin (CUBICIN) 600 mg in sodium chloride 0.9 % IVPB  Status:  Discontinued       ?  8 mg/kg ? 72.6 kg ?124 mL/hr over 30 Minutes Intravenous Daily 07/24/21 1609 07/26/21 1056  ? 07/24/21 1800  vancomycin (VANCOCIN) 50 mg/mL oral solution SOLN 125 mg  Status:  Discontinued       ? 125 mg Oral Every 6 hours 07/24/21 1607 07/24/21 1609  ? 07/24/21 1700  cefTRIAXone (ROCEPHIN) 1 g in sodium chloride 0.9 % 100 mL IVPB  Status:  Discontinued       ? 1 g ?200 mL/hr over 30 Minutes Intravenous Every 24 hours 07/24/21 1607 07/24/21 1609  ? ?  ? ? ?Objective: ?Vitals:  ? 07/30/21 0751 07/30/21 1129  ?BP: (!) 153/83 115/71  ?Pulse: 74 66  ?Resp: 18 17  ?Temp: 98 ?F (36.7 ?C) 98.3 ?F (36.8 ?C)  ?SpO2: 96% 97%  ? ? ?Intake/Output Summary (Last 24 hours) at 07/30/2021 1410 ?Last data filed at 07/30/2021 0900 ?Gross per 24 hour  ?Intake 750 ml  ?Output 850 ml  ?Net -100 ml  ? ? ?Filed Weights  ? 07/27/21 0809 07/28/21 0500 07/30/21 0320  ?Weight: 72.6 kg 71.6 kg 73.9 kg  ? ?Weight change:  ?Body mass index is 30.78 kg/m?.  ? ?Physical Exam: ?General exam: Pleasant, elderly Caucasian female.  Not in distress at rest but pain on any movement ?Skin: No rashes, lesions or ulcers. ?HEENT: Atraumatic, normocephalic, no obvious bleeding ?Lungs: Clear to auscultation bilaterally ?CVS: Regular rate and rhythm, no murmur ?GI/Abd soft, nontender, nondistended, bowel sound present ?CNS:  Alert, awake, oriented X 3 ?Psychiatry: Mood appropriate ?Extremities: No pedal edema, no calf ? ?Data Review: I have personally reviewed the laboratory data and studies available. ? ?F/u labs ordered ?Leane Para

## 2021-07-31 ENCOUNTER — Inpatient Hospital Stay (HOSPITAL_COMMUNITY): Payer: PPO

## 2021-07-31 DIAGNOSIS — M4646 Discitis, unspecified, lumbar region: Secondary | ICD-10-CM | POA: Diagnosis not present

## 2021-07-31 MED ORDER — FENTANYL 25 MCG/HR TD PT72
1.0000 | MEDICATED_PATCH | TRANSDERMAL | Status: DC
  Administered 2021-07-31 – 2021-08-03 (×2): 1 via TRANSDERMAL
  Filled 2021-07-31 (×2): qty 1

## 2021-07-31 MED ORDER — APIXABAN 5 MG PO TABS: 5.0000 mg | ORAL_TABLET | Freq: Two times a day (BID) | ORAL | Status: DC

## 2021-07-31 MED ORDER — GADOBUTROL 1 MMOL/ML IV SOLN
7.0000 mL | Freq: Once | INTRAVENOUS | Status: AC | PRN
  Administered 2021-07-31: 7 mL via INTRAVENOUS

## 2021-07-31 MED ORDER — APIXABAN 5 MG PO TABS
5.0000 mg | ORAL_TABLET | Freq: Two times a day (BID) | ORAL | Status: DC
Start: 2021-08-04 — End: 2021-08-01

## 2021-07-31 NOTE — Progress Notes (Incomplete)
?PROGRESS NOTE ? ?Brooke Beasley  ?DOB: Dec 13, 1930  ?PCP: [name], MD ?HWE:[id number]  ?DOA: 07/23/2021 ? LOS: 7 days  ?Hospital Day: 9 ? ?Brief narrative: ?Brooke Beasley is a 88 y.o. female with PMH significant for HTN, HLD, carotid stenosis, CKD 3, recurrent C. difficile infection, anxiety/depression, hiatal hernia, GERD, hypothyroidism, osteoarthritis who had multiple prior admissions for syncope with falls and fractures. ?07/13/2021, patient was seen at the PCPs office with 2 months of intermittent discomfort and low back pain worsening over a period of 2 weeks.  With the suspicion of right sciatica, she was given steroid taper and as needed hydrocodone.  She also recently completed 2 weeks course of oral Augmentin. ? ?07/23/2021, because of unrelenting pain, outpatient MRI was performed which showed phlegmon versus abscess L5-S1 with T1 abnormality  ?Patient was sent to the ED ? ?In the ED, patient was in new onset A-fib ?Blood culture was drawn, patient was started on broad-spectrum antibiotics. ?Admitted to hospitalist service.  ID was consulted. ?3/31, follow-up MRI with contrast showed a 4 mm epidural phlegmon without drainable component. ?3/31, underwent disc aspiration that grew Enterococcus.  Following CT scan showed decrease in the size of seroma. ?4/2, started endocarditis coverage with Unasyn ?4/3, TEE negative for endocarditis antibiotics adjusted by ID, PICC line placed by vascular access team ? ?Subjective: ?Patient was seen and examined this morning. *** ? ?Principal Problem: ?  Lumbar discitis ?Active Problems: ?  Right-sided low back pain with right-sided sciatica ?  New onset a-fib [facility]) ?  Valvular heart disease ?  Hypothyroidism ?  HLD (hyperlipidemia) ?  Hypertension, essential ?  Stage 3a chronic kidney disease (CKD) ([place]) ?  Disc disorder ?  ? 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   ?-Status post IR  guided disc aspiration 3/31---fluid culture grew Enterococcus.  TEE negative for endocarditis. ?-Currently only Unasyn --> PICC line placed 4/3-stop date antibiotics 09/03/2021 (6 weeks) ?-Pain control with Norco 08/27/2023 q4 prn, mild pain Tylenol I will stop naproxen.  Continue gabapentin 3 times daily ?-IV morphine on board 1 mg every 3 as needed severe pain ?-Patient continues to have pain.  Noted a plan from ID to repeat inflammatory markers today.  Based on the results, patient may need MRI repeated sooner than later. ? ?Diarrhea ?Patient has history of prior recurrent C. Difficile requiring prolonged prior vancomycin taper plus Zinplava 04/2019 ?-Currently on secondary C. difficile prophylaxis with vancomycin 125 p.o. daily till 5/17 ?-Patient complained of diarrhea yesterday morning.  Improved with 1 dose of Imodium. ? ?New onset A-fib ?-Likely due to infection.  Currently on metoprolol 12.5 mg twice daily ?-CHA2DS2-VASc score=5.  Currently on Eliquis. ?-Outpatient follow-up Dr. [name] for aortic valve stenosis as needed ? ?Subclinical Hypothyroid ?-Continue 100 mcg dose ? ?Essential hypertension  ?CKD 3B ?-Blood pressure controlled.  Continue to hold irbesartan, Lasix, Aldactone ?Recent Labs  ?  05/22/21 ?1008 05/27/21 ?0224 05/28/21 ?0237 05/28/21 ?0600 05/30/21 ?0518 07/23/21 ?1555 07/24/21 ?8938 07/25/21 ?1017 07/26/21 ?5102 07/27/21 ?5852 07/28/21 ?7782 07/29/21 ?4235 07/30/21 ?3614  ?BUN 29* 14  --   --   --  '23 15 13 12 12 12 16 12  '$ ?CREATININE 1.00 1.08*   < > 0.75 0.79 0.90 1.04* 1.05* 0.94 0.77 0.78 0.93 0.85  ? < > = values in this interval not displayed.  ? ? ?Hyponatremia ?-Improved. ?Recent Labs  ?Lab 07/25/21 ?4315 07/26/21 ?4008 07/27/21 ?6761 07/28/21 ?9509 07/29/21 ?3267 07/30/21 ?1245  ?  NA 134* 131* 131* 132* 135 137  ? ? ?Neuropathy  ?-Continue gabapentin 400 tid--pain is much improved with this ?Monitor mentation carefully ? ?Anxiety ?-Klonopin 0.5 at bedtime and Zoloft in the  morning ? ?Goals of care ?  Code Status: DNR  ? ? ?Mobility: PT eval obtained.  Home with PT recommended. ? ?Nutritional status:  ?Body mass index is 30.58 kg/m?.  ?  ?  ? ?{Tip this will not be part of the note when signed Body mass index is 30.58 kg/m?. ?, ,  (Optional):26781} ? ? ?Diet:  ?Diet Order   ? ?       ?  Diet regular Room service appropriate? Yes; Fluid consistency: Thin  Diet effective now       ?  ? ?  ?  ? ?  ? ? ?DVT prophylaxis:  ?SCDs Start: 07/23/21 2133 ?apixaban (ELIQUIS) tablet 5 mg   ?Antimicrobials: IV Unasyn ?Fluid: None ?Consultants: ID ?Family Communication: Family at bedside ? ?Status is: inpatient ? ?Continue in-hospital care because: Back pain not improving yet.   ?Level of care: Telemetry Surgical  ? ?Dispo: The patient is from: Home ?             Anticipated d/c is to: CIR denied by insurance.  Home with home health PT after clinical improvement ?             Patient currently is not medically stable to d/c. ?  Difficult to place patient No ? ? ? ? ?Infusions:  ? ampicillin-sulbactam (UNASYN) IV 3 g (07/31/21 0546)  ? ? ?Scheduled Meds: ? acetaminophen  500 mg Oral QID  ? apixaban  5 mg Oral BID  ? brimonidine  1 drop Both Eyes Daily  ? Chlorhexidine Gluconate Cloth  6 each Topical Daily  ? clonazePAM  0.5 mg Oral QHS  ? ferrous sulfate  325 mg Oral BID WC  ? gabapentin  400 mg Oral TID  ? latanoprost  1 drop Both Eyes QHS  ? levothyroxine  100 mcg Oral Daily  ? loperamide  2 mg Oral QHS  ? magnesium oxide  800 mg Oral BID  ? metoprolol tartrate  12.5 mg Oral BID  ? sertraline  100 mg Oral q AM  ? sodium chloride flush  10-40 mL Intracatheter Q12H  ? vancomycin  125 mg Oral QHS  ? ? ?PRN meds: ?albuterol, Gerhardt's butt cream, HYDROcodone-acetaminophen, loperamide, melatonin, morphine injection, ondansetron **OR** ondansetron (ZOFRAN) IV, sodium chloride flush  ? ?Antimicrobials: ?Anti-infectives (From admission, onward)  ? ? Start     Dose/Rate Route Frequency Ordered Stop  ?  07/26/21 2200  cefTRIAXone (ROCEPHIN) 2 g in sodium chloride 0.9 % 100 mL IVPB  Status:  Discontinued       ? 2 g ?200 mL/hr over 30 Minutes Intravenous Every 12 hours 07/26/21 1219 07/27/21 1043  ? 07/26/21 1200  Ampicillin-Sulbactam (UNASYN) 3 g in sodium chloride 0.9 % 100 mL IVPB       ? 3 g ?200 mL/hr over 30 Minutes Intravenous Every 6 hours 07/26/21 1056    ? 07/24/21 2200  vancomycin (VANCOCIN) capsule 125 mg       ? 125 mg Oral Daily at bedtime 07/24/21 1609    ? 07/24/21 2100  cefTRIAXone (ROCEPHIN) 2 g in sodium chloride 0.9 % 100 mL IVPB  Status:  Discontinued       ? 2 g ?200 mL/hr over 30 Minutes Intravenous Every 24 hours 07/24/21 1609 07/26/21 1219  ?  07/24/21 2000  DAPTOmycin (CUBICIN) 600 mg in sodium chloride 0.9 % IVPB  Status:  Discontinued       ? 8 mg/kg ? 72.6 kg ?124 mL/hr over 30 Minutes Intravenous Daily 07/24/21 1609 07/26/21 1056  ? 07/24/21 1800  vancomycin (VANCOCIN) 50 mg/mL oral solution SOLN 125 mg  Status:  Discontinued       ? 125 mg Oral Every 6 hours 07/24/21 1607 07/24/21 1609  ? 07/24/21 1700  cefTRIAXone (ROCEPHIN) 1 g in sodium chloride 0.9 % 100 mL IVPB  Status:  Discontinued       ? 1 g ?200 mL/hr over 30 Minutes Intravenous Every 24 hours 07/24/21 1607 07/24/21 1609  ? ?  ? ? ?Objective: ?Vitals:  ? 07/30/21 2321 07/31/21 0332  ?BP: (!) 145/74 (!) 152/77  ?Pulse: 74 73  ?Resp:  14  ?Temp: 98.3 ?F (36.8 ?C) 98.1 ?F (36.7 ?C)  ?SpO2: 95% 97%  ? ? ?Intake/Output Summary (Last 24 hours) at 07/31/2021 0741 ?Last data filed at 07/31/2021 1937 ?Gross per 24 hour  ?Intake 700 ml  ?Output 150 ml  ?Net 550 ml  ? ? ?Filed Weights  ? 07/28/21 0500 07/30/21 0320 07/31/21 0700  ?Weight: 71.6 kg 73.9 kg 73.4 kg  ? ?Weight change: -0.5 kg ?Body mass index is 30.58 kg/m?.  ? ?Physical Exam: ?General exam: Pleasant, elderly Caucasian female.  Not in distress at rest but pain on any movement ?Skin: No rashes, lesions or ulcers. ?HEENT: Atraumatic, normocephalic, no obvious bleeding ?Lungs:  Clear to auscultation bilaterally ?CVS: Regular rate and rhythm, no murmur ?GI/Abd soft, nontender, nondistended, bowel sound present ?CNS: Alert, awake, oriented X 3 ?Psychiatry: Mood appropriate ?Extremities: No pedal edema

## 2021-07-31 NOTE — Progress Notes (Signed)
Transport taking patient to MRI @ this time. ?

## 2021-07-31 NOTE — Progress Notes (Signed)
Physical Therapy Treatment ?Patient Details ?Name: Brooke Beasley ?MRN: [id number] ?DOB: 1931/01/08 ?Today's Date: 07/31/2021 ? ? ?History of Present Illness Pt is an 88 y.o. female who presented 07/23/21 with a 2 month hx of increasing low back pain with radiation down her R leg. Per chart, her symptoms began after she underwent rectal prolapse surgery in January of 2023. MRI was concerning for L5-S1 discitis/osteomyelitis. S/p L5-S1 disc aspiration 3/31. PMH: carotid stenosis, CKD, diverticulosis, glaucoma, heart murmur, HTN, HLD, hypothyroidism, pseudogout, osteoporosis ? ?  ?PT Comments  ? ? Patient limited today due to pain only able to get to/from [facility] despite Fentanyl patch ordered per RN.  Patient now for procedure Monday for abscess/hematoma aspiration and pt/family hopeful to improve pain.  PT will continue to follow.  Still most appropriate for HHPT with 24 hour assist.   ?Recommendations for follow up therapy are one component of a multi-disciplinary discharge planning process, led by the attending physician.  Recommendations may be updated based on patient status, additional functional criteria and insurance authorization. ? ?Follow Up Recommendations ? Home health PT ?  ?  ?Assistance Recommended at Discharge Frequent or constant Supervision/Assistance  ?Patient can return home with the following A lot of help with walking and/or transfers;Direct supervision/assist for medications management;Assistance with cooking/housework;A lot of help with bathing/dressing/bathroom;Assist for transportation;Help with stairs or ramp for entrance ?  ?Equipment Recommendations ? None recommended by PT  ?  ?Recommendations for Other Services   ? ? ?  ?Precautions / Restrictions Precautions ?Precautions: Fall ?Precaution Comments: back precautions for comfort  ?  ? ?Mobility ? Bed Mobility ?Overal bed mobility: Needs Assistance ?Bed Mobility: Sidelying to Sit ?  ?Sidelying to sit: Min guard ?  ?  ?Sit to sidelying:  Min assist ?General bed mobility comments: assist for lifting trunk to sit, for guiding legs up over EOB to supine ?  ? ?Transfers ?Overall transfer level: Needs assistance ?Equipment used: Rolling walker (2 wheels), None ?Transfers: Bed to chair/wheelchair/BSC ?  ?  ?Step pivot transfers: Min assist ?Squat pivot transfers: Min assist ?  ?  ?General transfer comment: pivot to [facility] with A for balance, assist for don/doff brief; step pivot with RW back to bed after assist for hygiene and application of cream ?  ? ?Ambulation/Gait ?  ?  ?  ?  ?  ?  ?  ?General Gait Details: unable to tolerate ambulation today ? ? ?Stairs ?  ?  ?  ?  ?  ? ? ?Wheelchair Mobility ?  ? ?Modified Rankin (Stroke Patients Only) ?  ? ? ?  ?Balance Overall balance assessment: Needs assistance ?  ?Sitting balance-Leahy Scale: Fair ?  ?  ?Standing balance support: Bilateral upper extremity supported ?Standing balance-Leahy Scale: Poor ?  ?  ?  ?  ?  ?  ?  ?  ?  ?  ?  ?  ?  ? ?  ?Cognition Arousal/Alertness: Awake/alert ?Behavior During Therapy: [facility] for tasks assessed/performed ?Overall Cognitive Status: Within Functional Limits for tasks assessed ?  ?  ?  ?  ?  ?  ?  ?  ?  ?  ?  ?  ?  ?  ?  ?  ?  ?  ?  ? ?  ?Exercises   ? ?  ?General Comments General comments (skin integrity, edema, etc.): daughter present and supportive, hopeful for procedure now planned for Monday to help in pain relief ?  ?  ? ?Pertinent Vitals/Pain Pain Assessment ?  Faces Pain Scale: Hurts worst ?Pain Location: back, R leg ?Pain Descriptors / Indicators: Discomfort, Grimacing, Guarding ?Pain Intervention(s): Monitored during session, Limited activity within patient's tolerance, Heat applied  ? ? ?Home Living   ?  ?  ?  ?  ?  ?  ?  ?  ?  ?   ?  ?Prior Function    ?  ?  ?   ? ?PT Goals (current goals can now be found in the care plan section) Progress towards PT goals: Not progressing toward goals - comment ? ?  ?Frequency ? ? ? Min 3X/week ? ? ? ?  ?PT Plan Current plan remains  appropriate  ? ? ?Co-evaluation   ?  ?  ?  ?  ? ?  ?AM-PAC PT "6 Clicks" Mobility   ?Outcome Measure ? Help needed turning from your back to your side while in a flat bed without using bedrails?: A Little ?Help needed moving from lying on your back to sitting on the side of a flat bed without using bedrails?: A Little ?Help needed moving to and from a bed to a chair (including a wheelchair)?: A Little ?Help needed standing up from a chair using your arms (e.g., wheelchair or bedside chair)?: A Little ?Help needed to walk in hospital room?: Total ?Help needed climbing 3-5 steps with a railing? : Total ?6 Click Score: 14 ? ?  ?End of Session   ?Activity Tolerance: Patient limited by pain ?Patient left: in bed;with call bell/phone within reach;with family/visitor present ?  ?PT Visit Diagnosis: Unsteadiness on feet (R26.81);Other abnormalities of gait and mobility (R26.89);Muscle weakness (generalized) (M62.81);Difficulty in walking, not elsewhere classified (R26.2);Pain ?Pain - Right/Left: Right ?Pain - part of body: Leg ?  ? ? ?Time: 1500-1520 ?PT Time Calculation (min) (ACUTE ONLY): 20 min ? ?Charges:  $Therapeutic Activity: 8-22 mins          ?          ? ?Magda Kiel, PT ?Acute Rehabilitation Services ?[facility]:[phone] ?Office:[phone] ?07/31/2021 ? ? ? ?Brooke Beasley ?07/31/2021, 5:21 PM ? ?

## 2021-07-31 NOTE — Progress Notes (Signed)
?PROGRESS NOTE ? ?Brooke Beasley  ?DOB: November 25, 1930  ?PCP: [name], MD ?XBL:[id number]  ?DOA: 07/23/2021 ? LOS: 7 days  ?Hospital Day: 9 ? ?Brief narrative: ?Faiga Stones is a 88 y.o. female with PMH significant for HTN, HLD, carotid stenosis, CKD 3, recurrent C. difficile infection, anxiety/depression, hiatal hernia, GERD, hypothyroidism, osteoarthritis who had multiple prior admissions for syncope with falls and fractures. ?07/13/2021, patient was seen at the PCPs office with 2 months of intermittent discomfort and low back pain worsening over a period of 2 weeks.  With the suspicion of right sciatica, she was given steroid taper and as needed hydrocodone.  She also recently completed 2 weeks course of oral Augmentin. ? ?07/23/2021, because of unrelenting pain, outpatient MRI was performed which showed phlegmon versus abscess L5-S1 with T1 abnormality  ?Patient was sent to the ED ? ?In the ED, patient was in new onset A-fib ?Blood culture was drawn, patient was started on broad-spectrum antibiotics. ?Admitted to hospitalist service.  ID was consulted. ?3/31, follow-up MRI with contrast showed a 4 mm epidural phlegmon without drainable component. ?3/31, underwent disc aspiration that grew Enterococcus.  Following CT scan showed decrease in the size of seroma. ?4/2, started endocarditis coverage with Unasyn ?4/3, TEE negative for endocarditis antibiotics adjusted by ID, PICC line placed by vascular access team ?4/7, MRI lumbar and sacral spine were repeated because of persistent pain and elevated inflammatory markers.  8 showed continued discitis osteomyelitis and L5-S1 level.  It also showed 6 cm size collection posterior to the rectum likely an abscess. ? ?Subjective: ?Patient was seen and examined this morning.   ?Continues to have pain on the movement.  One of the daughters at bedside.  Underwent MRI this morning.  Report as above. ? ?Principal Problem: ?  Lumbar discitis ?Active  Problems: ?  Right-sided low back pain with right-sided sciatica ?  New onset a-fib [facility]) ?  Valvular heart disease ?  Hypothyroidism ?  HLD (hyperlipidemia) ?  Hypertension, essential ?  Stage 3a chronic kidney disease (CKD) ([place]) ?  Disc disorder ?  ? 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   ?-Status post IR guided disc aspiration 3/31---fluid culture grew Enterococcus.  TEE negative for endocarditis. ?-Patient continues to have pain and elevated inflammatory markers.  Lumbosacral MRI repeated today.  Findings as above showing persistent discitis, osteomyelitis and L5-S1 level and worsening size of abscess in pelvis posterior to the rectum. ?-Discussed with ID.  Discussed with IR.  Radiologist to review and decide if any need of aspiration. ?-Currently only Unasyn --> PICC line placed 4/3-stop date antibiotics 09/03/2021 (6 weeks) ?-Currently getting pain control with Norco 08/27/2023 q4 prn, Tylenol 500 mg 4 times daily, gabapentin 400 mg 3 times daily.  As needed IV morphine.  I will add fentanyl patch to minimize the use of as needed meds ? ?Diarrhea ?Patient has history of prior recurrent C. Difficile requiring prolonged prior vancomycin taper plus Zinplava 04/2019 ?-Currently on secondary C. difficile prophylaxis with vancomycin 125 p.o. daily till 5/17 ?-Diarrhea improved after as needed Imodium started.  Last bowel movement this morning. ? ?New onset A-fib ?-Likely due to infection.  Currently on metoprolol 12.5 mg twice daily ?-CHA2DS2-VASc score=5.  Currently on Eliquis. ?-Outpatient follow-up Dr. [name] for aortic valve stenosis as needed ? ?Subclinical hypothyroidism ?-Continue Synthroid 100 mcg dose ? ?Essential hypertension  ?CKD 3B ?-Blood pressure controlled.  Continue to hold irbesartan, Lasix, Aldactone ?Recent Labs  ?  05/22/21 ?1008 05/27/21 ?0224 05/28/21 ?0237 05/28/21 ?0600 05/30/21 ?0518 07/23/21 ?1555 07/24/21 ?5643 07/25/21 ?3295  07/26/21 ?1884 07/27/21 ?1660 07/28/21 ?6301 07/29/21 ?6010 07/30/21 ?9323  ?BUN 29* 14  --   --   --  '23 15 13 12 12 12 16 12  '$ ?CREATININE 1.00 1.08*   < > 0.75 0.79 0.90 1.04* 1.05* 0.94 0.77 0.78 0.93 0.85  ? < > = values in this interval not displayed.  ? ?Anxiety ?-Klonopin 0.5 at bedtime and Zoloft in the morning ? ?Goals of care ?  Code Status: DNR  ? ? ?Mobility: PT eval obtained.  Home with PT recommended. ? ?Nutritional status:  ?Body mass index is 30.58 kg/m?.  ?  ?  ? ? ? ? ?Diet:  ?Diet Order   ? ?       ?  Diet regular Room service appropriate? Yes; Fluid consistency: Thin  Diet effective now       ?  ? ?  ?  ? ?  ? ? ?DVT prophylaxis:  ?SCDs Start: 07/23/21 2133 ?apixaban (ELIQUIS) tablet 5 mg   ?Antimicrobials: IV Unasyn ?Fluid: None ?Consultants: ID ?Family Communication: One of the daughters at bedside ? ?Status is: inpatient ? ?Continue in-hospital care because: Back pain not improving yet.  New MRI findings.  Pending IR recommendation ?Level of care: Telemetry Surgical  ? ?Dispo: The patient is from: Home ?             Anticipated d/c is to: CIR denied by insurance.  Home with home health PT after clinical improvement ?             Patient currently is not medically stable to d/c. ?  Difficult to place patient No ? ? ? ? ?Infusions:  ? ampicillin-sulbactam (UNASYN) IV 3 g (07/31/21 1137)  ? ? ?Scheduled Meds: ? acetaminophen  500 mg Oral QID  ? apixaban  5 mg Oral BID  ? brimonidine  1 drop Both Eyes Daily  ? Chlorhexidine Gluconate Cloth  6 each Topical Daily  ? clonazePAM  0.5 mg Oral QHS  ? fentaNYL  1 patch Transdermal Q72H  ? ferrous sulfate  325 mg Oral BID WC  ? gabapentin  400 mg Oral TID  ? latanoprost  1 drop Both Eyes QHS  ? levothyroxine  100 mcg Oral Daily  ? loperamide  2 mg Oral QHS  ? magnesium oxide  800 mg Oral BID  ? metoprolol tartrate  12.5 mg Oral BID  ? sertraline  100 mg Oral q AM  ? sodium chloride flush  10-40 mL Intracatheter Q12H  ? vancomycin  125 mg Oral QHS   ? ? ?PRN meds: ?albuterol, Gerhardt's butt cream, HYDROcodone-acetaminophen, loperamide, melatonin, morphine injection, ondansetron **OR** ondansetron (ZOFRAN) IV, sodium chloride flush  ? ?Antimicrobials: ?Anti-infectives (From admission, onward)  ? ? Start     Dose/Rate Route Frequency Ordered Stop  ? 07/26/21 2200  cefTRIAXone (ROCEPHIN) 2 g in sodium chloride 0.9 % 100 mL IVPB  Status:  Discontinued       ? 2 g ?200 mL/hr over 30 Minutes Intravenous Every 12 hours 07/26/21 1219 07/27/21 1043  ? 07/26/21 1200  Ampicillin-Sulbactam (UNASYN) 3 g in sodium chloride 0.9 % 100 mL IVPB       ? 3 g ?200 mL/hr over 30 Minutes Intravenous Every 6 hours 07/26/21 1056    ? 07/24/21 2200  vancomycin (VANCOCIN) capsule 125 mg       ? 125 mg Oral  Daily at bedtime 07/24/21 1609    ? 07/24/21 2100  cefTRIAXone (ROCEPHIN) 2 g in sodium chloride 0.9 % 100 mL IVPB  Status:  Discontinued       ? 2 g ?200 mL/hr over 30 Minutes Intravenous Every 24 hours 07/24/21 1609 07/26/21 1219  ? 07/24/21 2000  DAPTOmycin (CUBICIN) 600 mg in sodium chloride 0.9 % IVPB  Status:  Discontinued       ? 8 mg/kg ? 72.6 kg ?124 mL/hr over 30 Minutes Intravenous Daily 07/24/21 1609 07/26/21 1056  ? 07/24/21 1800  vancomycin (VANCOCIN) 50 mg/mL oral solution SOLN 125 mg  Status:  Discontinued       ? 125 mg Oral Every 6 hours 07/24/21 1607 07/24/21 1609  ? 07/24/21 1700  cefTRIAXone (ROCEPHIN) 1 g in sodium chloride 0.9 % 100 mL IVPB  Status:  Discontinued       ? 1 g ?200 mL/hr over 30 Minutes Intravenous Every 24 hours 07/24/21 1607 07/24/21 1609  ? ?  ? ? ?Objective: ?Vitals:  ? 07/31/21 0747 07/31/21 1112  ?BP: (!) 157/68 (!) 93/58  ?Pulse: 73 82  ?Resp: 14 18  ?Temp: 98.3 ?F (36.8 ?C) 98.6 ?F (37 ?C)  ?SpO2: 94% 95%  ? ? ?Intake/Output Summary (Last 24 hours) at 07/31/2021 1201 ?Last data filed at 07/31/2021 0800 ?Gross per 24 hour  ?Intake 300 ml  ?Output 500 ml  ?Net -200 ml  ? ?Filed Weights  ? 07/28/21 0500 07/30/21 0320 07/31/21 0700  ?Weight: 71.6  kg 73.9 kg 73.4 kg  ? ?Weight change: -0.5 kg ?Body mass index is 30.58 kg/m?.  ? ?Physical Exam: ?General exam: Pleasant, elderly Caucasian female.  Not in distress at rest but pain on any movement ?Skin: No rashes,

## 2021-07-31 NOTE — Care Management Important Message (Signed)
Important Message ? ?Patient Details  ?Name: Brooke Beasley ?MRN: [id number] ?Date of Birth: 1930/10/01 ? ? ?Medicare Important Message Given:  Yes ? ? ? ? ?Aleicia Kenagy ?07/31/2021, 12:25 PM ?

## 2021-07-31 NOTE — Progress Notes (Signed)
?   ? ?[facility] for Infectious Disease ? ?Date of Admission:  07/23/2021    ?       ?Reason for visit: Follow up on discitis/osteomyelitis ? ?Current antibiotics: ?Ampicillin sulbactam 4/2--present ?Vancomycin p.o. 3/31-present ?  ?Previous antibiotics: ?Daptomycin 3/31-4/2 ?Ceftriaxone 3/31-4/3 ? ?ASSESSMENT:   ? ?88 y.o. female admitted with: ? ?Discitis/osteomyelitis L5-S1 secondary to Enterococcus faecalis.  Spinal infection initially noted on MRI without contrast 3/30 after several weeks of worsening back pain followed by MRI with contrast 3/31 noting an additional 4 mm epidural phlegmon without drainable component.  She underwent IR disc aspiration 3/31 with cultures finalizing as Enterococcus faecalis.  TEE was performed on 4/3 due to concern for subacute endocarditis and this was negative.  An MRI was repeated again today due to worsening inflammatory markers.  This was significant for ongoing discitis/osteomyelitis as expected at L5-S1 as well as anterior epidural phlegmon measuring up to 6 mm in thickness with a developing small abscess measuring 1.2 x 0.4 cm. ?Intra-abdominal fluid collections: Initially noted on imaging as an outpatient in early March.  She received approximately 2 weeks of Augmentin from her PCP related to a possible upper respiratory infection from March 1 through 14.  Follow-up CT scan on 3/31 then noted improving fluid collections, however, MRI today again notes a rim-enhancing collection posterior to the rectum measuring 6.2 x 1.1 x 2.4 cm consistent with seroma or abscess.  These dimensions are relatively unchanged from prior measurements in early March greater than 1 month ago.  There is a smaller collection along the vaginal vault on the left with imaging characteristics that suggest hematoma. ?History of recurrent C. difficile: Currently on oral vancomycin prophylaxis. ?CKD stage 3: Stable issue. ?Recent rectal prolapse surgery: Completed on 05/26/2021. ? ?RECOMMENDATIONS:    ? ?Continue Unasyn ?Appreciate IR assistance regarding drainage of fluid collections.  They report the collection posterior to the rectum concerning for abscess appears amenable to drainage and will assess the patient. ?Please send any fluid for routine bacterial cultures ?Continue vancomycin p.o. for C. difficile prophylaxis ?Discussed with primary ?Updated patient and her daughter at the bedside ?Dr. Linus Salmons available as needed over the weekend.  I will return on Monday. ? ? ?Principal Problem: ?  Lumbar discitis ?Active Problems: ?  Hypothyroidism ?  HLD (hyperlipidemia) ?  Hypertension, essential ?  Valvular heart disease ?  Stage 3a chronic kidney disease (CKD) ([place]) ?  Right-sided low back pain with right-sided sciatica ?  New onset a-fib [facility]) ?  Disc disorder ? ? ? ?MEDICATIONS:   ? ?Scheduled Meds: ? acetaminophen  500 mg Oral QID  ? apixaban  5 mg Oral BID  ? brimonidine  1 drop Both Eyes Daily  ? Chlorhexidine Gluconate Cloth  6 each Topical Daily  ? clonazePAM  0.5 mg Oral QHS  ? fentaNYL  1 patch Transdermal Q72H  ? ferrous sulfate  325 mg Oral BID WC  ? gabapentin  400 mg Oral TID  ? latanoprost  1 drop Both Eyes QHS  ? levothyroxine  100 mcg Oral Daily  ? loperamide  2 mg Oral QHS  ? magnesium oxide  800 mg Oral BID  ? metoprolol tartrate  12.5 mg Oral BID  ? sertraline  100 mg Oral q AM  ? sodium chloride flush  10-40 mL Intracatheter Q12H  ? vancomycin  125 mg Oral QHS  ? ?Continuous Infusions: ? ampicillin-sulbactam (UNASYN) IV 3 g (07/31/21 1137)  ? ?PRN Meds:.albuterol, Gerhardt's butt cream,  HYDROcodone-acetaminophen, loperamide, melatonin, morphine injection, ondansetron **OR** ondansetron (ZOFRAN) IV, sodium chloride flush ? ?SUBJECTIVE:  ? ?24 hour events:  ?No acute events ?Tmax 98.6 ?Inflammatory markers significantly elevated ?Repeat MRI as noted above ? ? ?Patient continues to report significant lower back pain and radiating symptoms down her leg.  No fevers. ? ?Review of Systems  ?All  other systems reviewed and are negative. ? ?  ?OBJECTIVE:  ? ?Blood pressure (!) 93/58, pulse 82, temperature 98.6 ?F (37 ?C), temperature source Oral, resp. rate 18, height '5\' 1"'$  (1.549 m), weight 73.4 kg, SpO2 95 %. ?Body mass index is 30.58 kg/m?. ? ?Physical Exam ?Constitutional:   ?   Appearance: Normal appearance.  ?   Comments: She is not in acute distress however does appear uncomfortable  ?HENT:  ?   Head: Normocephalic and atraumatic.  ?Eyes:  ?   Extraocular Movements: Extraocular movements intact.  ?   Conjunctiva/sclera: Conjunctivae normal.  ?Pulmonary:  ?   Effort: Pulmonary effort is normal. No respiratory distress.  ?Abdominal:  ?   General: There is no distension.  ?   Palpations: Abdomen is soft.  ?Musculoskeletal:  ?   Cervical back: Normal range of motion and neck supple.  ?   Right lower leg: No edema.  ?   Left lower leg: No edema.  ?Skin: ?   General: Skin is warm and dry.  ?   Findings: No rash.  ?Neurological:  ?   General: No focal deficit present.  ?   Mental Status: She is alert and oriented to person, place, and time. Mental status is at baseline.  ?Psychiatric:     ?   Mood and Affect: Mood normal.     ?   Behavior: Behavior normal.  ? ? ? ?Lab Results: ?Lab Results  ?Component Value Date  ? WBC 9.2 07/30/2021  ? HGB 9.6 (L) 07/30/2021  ? HCT 30.3 (L) 07/30/2021  ? MCV 90.2 07/30/2021  ? PLT 237 07/30/2021  ?  ?Lab Results  ?Component Value Date  ? NA 137 07/30/2021  ? K 4.1 07/30/2021  ? CO2 27 07/30/2021  ? GLUCOSE 92 07/30/2021  ? BUN 12 07/30/2021  ? CREATININE 0.85 07/30/2021  ? CALCIUM 8.5 (L) 07/30/2021  ? GFRNONAA >60 07/30/2021  ? GFRAA 58 (L) 03/28/2019  ?  ?Lab Results  ?Component Value Date  ? ALT 13 07/27/2021  ? AST 17 07/27/2021  ? ALKPHOS 77 07/27/2021  ? BILITOT 0.1 (L) 07/27/2021  ? ? ?   ?Component Value Date/Time  ? CRP 9.5 (H) 07/30/2021 1046  ? ? ?   ?Component Value Date/Time  ? ESRSEDRATE 81 (H) 07/30/2021 1046  ? ?  ?I have reviewed the micro and lab results in  Epic. ? ?Imaging: ?MR Lumbar Spine W Wo Contrast ? ?Result Date: 07/31/2021 ?CLINICAL DATA:  discitis, worsening pain. EXAM: MRI LUMBAR SPINE WITHOUT AND WITH CONTRAST MRI SACRUM WITHOUT AND WITH CONTRAST TECHNIQUE: Multiplanar and multiecho pulse sequences of the lumbar spine and sacrum were obtained without and with intravenous contrast. CONTRAST:  57m GADAVIST GADOBUTROL 1 MMOL/ML IV SOLN COMPARISON:  CT 07/24/2021, MRI lumbar spine 07/24/2021 and 07/23/2021 FINDINGS: LUMBAR SPINE Segmentation:  Standard. Alignment: Dextroconvex lumbar curvature. Grade 1 anterolisthesis at L4-L5, unchanged. Vertebrae: There is continued discitis-osteomyelitis at L5-S1 marrow edema and enhancement. Similar endplate irregularity without progressive vertebral body height loss. Conus medullaris and cauda equina: Conus extends to the L1-L2 level. Conus and cauda equina appear normal. Paraspinal and other  soft tissues: Paraspinal soft tissue edema and enhancement insert at L4-L5. Disc levels: T11-T12: Shallow broad-based central disc protrusion. Mild facet arthropathy ligamentum flavum hypertrophy. Mild spinal canal narrowing. No significant neural foraminal stenosis. T12-L1: Minimal disc bulging. No significant spinal canal or neural foraminal stenosis. L1-L2: Right paracentral/foraminal disc protrusion. Right-sided subarticular stenosis with posterior displacement of the descending right L2 nerve root, unchanged. No central canal stenosis. Mild right-sided neural foraminal stenosis. Contact with the undersurface of the exiting right L1 nerve root, unchanged. L2-L3: Minimal disc bulging with bilateral facet arthropathy and ligamentum flavum hypertrophy. Mild left subarticular stenosis. Patent central canal. No significant neural foraminal stenosis. L3-L4: Minimal disc bulging with endplate spurring, bilateral facet arthropathy and ligament flavum hypertrophy. Mild to moderate left-sided subarticular stenosis, with potential impingement  of the descending left L4 nerve root. No central canal stenosis. Mild left neural foraminal stenosis. L4-L5: Grade 1 anterolisthesis, unchanged. Disc uncovering with broad-based disc bulging. Crecencio Mc

## 2021-07-31 NOTE — Consult Note (Signed)
? ?Chief Complaint: ?Patient was seen in consultation today for pelvic abscess aspiration/drain ?Chief Complaint  ?Patient presents with  ? Back Pain  ? at the request of Dr B Dahal ? ? ?Supervising Physician: Jacqulynn Cadet ? ?Patient Status: [facility] - In-pt ? ?History of Present Illness: ?Brooke Beasley is a 88 y.o. female ? ?Discitis L5-S1 noted since 07/24/21: +Enterococcus faecalis: antibiotics and follows with ID ?Continued pain and difficulty with movement ? ?MRI today:  IMPRESSION: ?Continued discitis osteomyelitis L5-S1 with similar degree of bony ?and adjacent soft tissue involvement. Unchanged craniocaudal extent ?of the ventral epidural phlegmon with developing internal abscess ?formation measuring 1.2 x 0.4 cm. Overall phlegmon measures 0.6 cm ?in thickness. Mild spinal canal stenosis and bilateral neural ?foraminal stenosis at this level. ?No evidence of sacral involvement below the level of S1. No evidence ?of septic SI joint arthritis. ?Multilevel degenerative changes of lumbar spine with varying degrees ?of spinal canal and neural foraminal stenosis as described above, ?unchanged from recent MRI on 07/24/2021 and 07/23/2021. ?Postsurgical changes in the pelvis, with unchanged fluid collections ?posterior to the rectum and along the left side of the vaginal ?vault, the latter of which has signal characteristics consistent ?with hematoma, the former possibly a seroma or abscess. ?  ?Request made for aspiration/ drain placement of pelvic abscess ? ?Dr Laurence Ferrari has reviewed imaging and approves procedure ?Last dose Eliquis this am ?IR procedure planned for 08/03/21 ? ?Past Medical History:  ?Diagnosis Date  ? Anxiety   ? C. difficile diarrhea 07/11/2018  ? S/p multiple oral vanc treatments (course and tapers) and completed Zinplava monoclonal Ab treatment (05/10/2019) Fecal transplant on hold during [place] pandemic  ? CAP (community acquired pneumonia) 12/22/2017  ? Carotid stenosis 10/18/2014   ? R 1-39%, L 40-59%, rpt 1 yr (09/2014)   ? CKD (chronic kidney disease) stage 3, GFR 30-59 ml/min (HCC) 08/02/2014  ? Closed fracture of right orbit ([place]) 02/10/2021  ? Clostridioides difficile infection   ? hx 2020  ? COVID-19 virus infection 10/13/2020  ? Degenerative disc disease   ? LS  ? Depression   ? nervous breakdonw in 1964-out of work for a year  ? Diverticulosis   ? severe by colonoscopy  ? Family history of adverse reaction to anesthesia   ? n/v  ? GERD (gastroesophageal reflux disease)   ? Glaucoma   ? Heart murmur 08/2011  ? mitral regurge - on echo   ? History of hiatal hernia   ? History of shingles   ? HTN (hypertension)   ? Hyperlipidemia   ? Hypertension   ? Hypothyroidism   ? IBS 11/02/2006  ? Intestinal bacterial overgrowth   ? In small colon  ? Left shoulder pain 11/04/2014  ? Lower GI bleed 12/2020  ? thought diverticular complicated by ABLA with syncope and orbital fracture s/p hospitalization  ? Maxillary fracture ([place]) 04/08/2012  ? Osteoarthritis 2016  ? Jefm Bryant)  ? Osteoporosis   ? dexa 2011  ? Pseudogout 2016  ? shoulders ([place])  ? ? ?Past Surgical History:  ?Procedure Laterality Date  ? barium enema  2012  ? severe diverticulosis, redundant colon  ? Bowel obstruction  1999  ? no surgery in hosp x 3 days  ? BUBBLE STUDY  07/27/2021  ? Procedure: BUBBLE STUDY;  Surgeon: Dixie Dials, MD;  Location: [facility];  Service: Cardiovascular;;  ? COLONOSCOPY  1. 1999  2. 11/04  ? 1. Not finished  2. Slight hemorrhage rectosigmoid area, severe sig  diverticulosis  ? COLONOSCOPY WITH PROPOFOL N/A 09/10/2019  ? SSP with dysplasia, TA, rpt 3 yrs (Vanga, Tally Due, MD)  ? Dexa  1. 8101-7510   2. 9/04  3. 3/08   ? 1. OP  2. OP, borderline, spine -2.44T  3. decreased BMD-OP  ? DG KNEE 1-2 VIEWS BILAT    ? LS x-ray with degenerative disc and facet change  ? ESOPHAGOGASTRODUODENOSCOPY    ? Negative  ? FLEXIBLE SIGMOIDOSCOPY N/A 01/16/2021  ? Procedure: FLEXIBLE SIGMOIDOSCOPY;  Surgeon: Gatha Mayer, MD;  Location: [facility] ENDOSCOPY;  Service: Endoscopy;  Laterality: N/A;  or unsedated  ? Hemorrhoid procedure  07/2006  ? PROCTOSCOPY N/A 05/26/2021  ? Procedure: RIGID PROCTOSCOPY;  Surgeon: Michael Boston, MD;  Location: [facility] ORS;  Service: General;  Laterality: N/A;  ? RECTOPEXY N/A 05/26/2021  ? Procedure: RECTOPEXY;  Surgeon: Michael Boston, MD;  Location: [facility] ORS;  Service: General;  Laterality: N/A;  ? TEE WITHOUT CARDIOVERSION N/A 07/27/2021  ? Procedure: TRANSESOPHAGEAL ECHOCARDIOGRAM (TEE);  Surgeon: Dixie Dials, MD;  Location: [facility];  Service: Cardiovascular;  Laterality: N/A;  ? TONSILLECTOMY    ? TOTAL SHOULDER ARTHROPLASTY Left 11/27/2015  ? Corky Mull, MD  ? TOTAL SHOULDER REVISION Left 03/16/2016  ? Procedure: TOTAL SHOULDER REVISION;  Surgeon: Corky Mull, MD;  Location: [facility] ORS;  Service: Orthopedics;  Laterality: Left;  ? US ECHOCARDIOGRAPHY  07/2011  ? Normal systolic fxn with EF 25-85%.  Focal basal septal hypertrophy.  Mild diastolic dysfunction.  Mild MR.  ? XI ROBOTIC ASSISTED LOWER ANTERIOR RESECTION N/A 05/26/2021  ? Procedure: ROBOTIC LOW ANTERIOR RECTOSIGMOID RESECTION; ASSESSMENT OF TISSUE PERFUSION WITH FIREFLY;  Surgeon: Michael Boston, MD;  Location: [facility];  Service: General;  Laterality: N/A;  ? ? ?Allergies: ?Amlodipine, Ace inhibitors, Carvedilol, Losartan, Other, Paroxetine hcl, Risedronate sodium, Simvastatin, Toprol xl [metoprolol], Alendronate sodium, Influenza vaccines, Silenor [doxepin hcl], and Sulfonamide derivatives ? ?Medications: ?Prior to Admission medications   ?Medication Sig Start Date End Date Taking? Authorizing Provider  ?acetaminophen (TYLENOL) 500 MG tablet Take 500-1,000 mg by mouth every 6 (six) hours as needed for moderate pain. Depends on pain   Yes [provider]  ?albuterol (PROVENTIL HFA;VENTOLIN HFA) 108 (90 Base) MCG/ACT inhaler Inhale 2 puffs into the lungs every 6 (six) hours as needed for wheezing or shortness of breath. 01/12/18  Yes  [name], MD  ?[facility] P 0.1 % SOLN Place 1 drop into both eyes daily. 03/10/21  Yes [provider]  ?bimatoprost (LUMIGAN) 0.01 % SOLN Place 1 drop into both eyes at bedtime.   Yes [provider]  ?Bioflavonoid Products (VITAMIN C) CHEW Chew 1 tablet by mouth daily.   Yes [provider]  ?bisacodyl (DULCOLAX) 5 MG EC tablet Take 1 tablet (5 mg total) by mouth daily as needed for moderate constipation. 01/16/21 01/16/22 Yes Lavina Hamman, MD  ?cholecalciferol (VITAMIN D) 1000 units tablet Take 1,000 Units by mouth daily.   Yes [provider]  ?clonazePAM (KLONOPIN) 0.5 MG tablet TAKE 1 TABLET BY MOUTH AT BEDTIME ?Patient taking differently: Take 0.5 mg by mouth at bedtime. 06/30/21  Yes [name], MD  ?colchicine 0.6 MG tablet Take 1 tablet (0.6 mg total) by mouth daily as needed (gout flare). 12/05/20  Yes [name], MD  ?Famotidine-Ca Carb-Mag Hydrox (PEPCID COMPLETE PO) Take 1 tablet by mouth daily as needed (indigestion).   Yes [provider]  ?furosemide (LASIX) 20 MG tablet Take 20 mg by  mouth daily as needed for edema or fluid.   Yes [provider]  ?gabapentin (NEURONTIN) 100 MG capsule Take 2 capsules by mouth 2 times daily. ?Patient taking differently: Take 200 mg by mouth 3 (three) times daily. 05/29/21  Yes Michael Boston, MD  ?HYDROcodone-acetaminophen (NORCO/VICODIN) 5-325 MG tablet Take 1 tablet by mouth 3 (three) times daily as needed for moderate pain or severe pain. 07/13/21  Yes [name], MD  ?hydrocortisone 2.5 % cream Place 1 application rectally daily as needed (hemorrhoids). 10/23/18  Yes [provider]  ?Infant Care Products (DERMACLOUD) OINT Apply 1 application topically 2 (two) times daily as needed. 06/16/21  Yes Venia Carbon, MD  ?irbesartan (AVAPRO) 150 MG tablet Take 1 tablet (150 mg total) by mouth daily. 03/31/21  Yes [name], MD  ?Iron-FA-B Cmp-C-Biot-Probiotic (FUSION PLUS)  CAPS Take 1 capsule by mouth once daily 06/22/21  Yes Vanga, Tally Due, MD  ?levothyroxine (SYNTHROID) 100 MCG tablet Take 1 tablet by mouth once daily 04/08/21  Yes [name], MD  ?[facility]

## 2021-08-01 DIAGNOSIS — M4646 Discitis, unspecified, lumbar region: Secondary | ICD-10-CM | POA: Diagnosis not present

## 2021-08-01 LAB — CBC WITH DIFFERENTIAL/PLATELET
Abs Immature Granulocytes: 0.03 10*3/uL (ref 0.00–0.07)
Basophils Absolute: 0 10*3/uL (ref 0.0–0.1)
Basophils Relative: 0 %
Eosinophils Absolute: 0.6 10*3/uL — ABNORMAL HIGH (ref 0.0–0.5)
Eosinophils Relative: 6 %
HCT: 30.9 % — ABNORMAL LOW (ref 36.0–46.0)
Hemoglobin: 9.7 g/dL — ABNORMAL LOW (ref 12.0–15.0)
Immature Granulocytes: 0 %
Lymphocytes Relative: 22 %
Lymphs Abs: 2 10*3/uL (ref 0.7–4.0)
MCH: 28.6 pg (ref 26.0–34.0)
MCHC: 31.4 g/dL (ref 30.0–36.0)
MCV: 91.2 fL (ref 80.0–100.0)
Monocytes Absolute: 1 10*3/uL (ref 0.1–1.0)
Monocytes Relative: 10 %
Neutro Abs: 5.6 10*3/uL (ref 1.7–7.7)
Neutrophils Relative %: 62 %
Platelets: 246 10*3/uL (ref 150–400)
RBC: 3.39 MIL/uL — ABNORMAL LOW (ref 3.87–5.11)
RDW: 17.6 % — ABNORMAL HIGH (ref 11.5–15.5)
WBC: 9.3 10*3/uL (ref 4.0–10.5)
nRBC: 0 % (ref 0.0–0.2)

## 2021-08-01 LAB — BASIC METABOLIC PANEL
Anion gap: 8 (ref 5–15)
BUN: 9 mg/dL (ref 8–23)
CO2: 26 mmol/L (ref 22–32)
Calcium: 8.8 mg/dL — ABNORMAL LOW (ref 8.9–10.3)
Chloride: 104 mmol/L (ref 98–111)
Creatinine, Ser: 0.98 mg/dL (ref 0.44–1.00)
GFR, Estimated: 56 mL/min — ABNORMAL LOW (ref >60)
Glucose, Bld: 99 mg/dL (ref 70–99)
Potassium: 4.3 mmol/L (ref 3.5–5.1)
Sodium: 138 mmol/L (ref 135–145)

## 2021-08-01 NOTE — Progress Notes (Signed)
Mobility Specialist Progress Note  ? ? 08/01/21 1501  ?Mobility  ?Activity Refused mobility  ? ?Pt states she has so much pain she cannot even sit up without passing out.  ? ?Hildred Alamin ?Mobility Specialist  ?  ?

## 2021-08-01 NOTE — Progress Notes (Signed)
?PROGRESS NOTE ? ?Brooke Beasley  ?DOB: 04/02/1931  ?PCP: [name], MD ?GYB:[id number]  ?DOA: 07/23/2021 ? LOS: 8 days  ?Hospital Day: 10 ? ?Brief narrative: ?Brooke Beasley is a 88 y.o. female with PMH significant for HTN, HLD, carotid stenosis, CKD 3, recurrent C. difficile infection, anxiety/depression, hiatal hernia, GERD, hypothyroidism, osteoarthritis who had multiple prior admissions for syncope with falls and fractures. ?07/13/2021, patient was seen at the PCPs office with 2 months of intermittent discomfort and low back pain worsening over a period of 2 weeks.  With the suspicion of right sciatica, she was given steroid taper and as needed hydrocodone.  She also recently completed 2 weeks course of oral Augmentin. ? ?07/23/2021, because of unrelenting pain, outpatient MRI was performed which showed phlegmon versus abscess L5-S1 with T1 abnormality  ?Patient was sent to the ED ? ?In the ED, patient was in new onset A-fib ?Blood culture was drawn, patient was started on broad-spectrum antibiotics. ?Admitted to hospitalist service.  ID was consulted. ?3/31, follow-up MRI with contrast showed a 4 mm epidural phlegmon without drainable component. ?3/31, underwent disc aspiration that grew Enterococcus.  Following CT scan showed decrease in the size of seroma. ?4/2, started endocarditis coverage with Unasyn ?4/3, TEE negative for endocarditis antibiotics adjusted by ID, PICC line placed by vascular access team ?4/7, MRI lumbar and sacral spine were repeated because of persistent pain and elevated inflammatory markers.  8 showed continued discitis osteomyelitis and L5-S1 level.  It also showed 6 cm size collection posterior to the rectum likely an abscess. ? ?Subjective: ?Patient was seen and examined this morning.   ?Continues to have pain with any movement.  Reports 2-3 episodes of diarrhea in last 24 hours. ?Pending needle aspiration by IR on Monday. ? ?Principal Problem: ?  Lumbar  discitis ?Active Problems: ?  Right-sided low back pain with right-sided sciatica ?  New onset a-fib [facility]) ?  Valvular heart disease ?  Hypothyroidism ?  HLD (hyperlipidemia) ?  Hypertension, essential ?  Stage 3a chronic kidney disease (CKD) ([place]) ?  Disc disorder ?  ? 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   ?-Status post IR guided disc aspiration 3/31---fluid culture grew Enterococcus.  TEE negative for endocarditis. ?-Patient continues to have pain and elevated inflammatory markers.  Lumbosacral MRI repeated today.  Findings as above showing persistent discitis, osteomyelitis and L5-S1 level and worsening size of abscess in pelvis posterior to the rectum. ?-Discussed with ID and IR.  IR plans to do needle aspiration on Monday.  Eliquis held as recommended.  ?-Currently only Unasyn --> PICC line placed 4/3-stop date antibiotics 09/03/2021 (6 weeks) ?-Currently getting pain control with Norco 08/27/2023 q4 prn, Tylenol 500 mg 4 times daily, gabapentin 400 mg 3 times daily.  As needed IV morphine.  I will add fentanyl patch to minimize the use of as needed meds ? ?Diarrhea ?-Patient has history of prior recurrent C. Difficile requiring prolonged prior vancomycin taper plus Zinplava 04/2019 ?-Currently on secondary C. difficile prophylaxis with vancomycin 125 p.o. daily till 5/17 ?-Diarrhea improved after as needed Imodium started.  Last bowel movement this morning. ? ?New onset A-fib ?-Likely due to infection.  Currently on metoprolol 12.5 mg twice daily ?-CHA2DS2-VASc score=5.  Eliquis was started but currently on hold for IR procedure on Monday ?-Outpatient follow-up Dr. [name] for aortic valve stenosis as needed ? ?Subclinical hypothyroidism ?-Continue Synthroid 100 mcg dose ? ?Essential hypertension  ?CKD 3B ?-Blood pressure  controlled.  Continue to hold irbesartan, Lasix, Aldactone ?Recent Labs  ?  05/27/21 ?0224 05/28/21 ?0237 05/30/21 ?0518  07/23/21 ?1555 07/24/21 ?6389 07/25/21 ?3734 07/26/21 ?2876 07/27/21 ?8115 07/28/21 ?7262 07/29/21 ?0355 07/30/21 ?9741 08/01/21 ?6384  ?BUN 14  --   --  '23 15 13 12 12 12 16 12 9  '$ ?CREATININE 1.08*   < > 0.79 0.90 1.04* 1.05* 0.94 0.77 0.78 0.93 0.85 0.98  ? < > = values in this interval not displayed.  ? ? ?Anxiety ?-Klonopin 0.5 at bedtime and Zoloft in the morning ? ?Goals of care ?  Code Status: DNR  ? ? ?Mobility: PT eval obtained.  Home with PT recommended. ? ?Nutritional status:  ?Body mass index is 30.58 kg/m?.  ?  ?  ? ? ? ? ?Diet:  ?Diet Order   ? ?       ?  Diet NPO time specified Except for: Sips with Meds  Diet effective midnight       ?  ?  Diet regular Room service appropriate? Yes; Fluid consistency: Thin  Diet effective now       ?  ? ?  ?  ? ?  ? ? ?DVT prophylaxis:  ?SCDs Start: 07/23/21 2133 ?  ?Antimicrobials: IV Unasyn ?Fluid: None ?Consultants: ID ?Family Communication: Grandkids at bedside ? ?Status is: inpatient ? ?Continue in-hospital care because: Back pain not improving yet.  New MRI findings.  Pending IR aspiration ?Level of care: Telemetry Surgical  ? ?Dispo: The patient is from: Home ?             Anticipated d/c is to: CIR denied by insurance.  Home with home health PT after clinical improvement.  May need PT reeval if remains in hospital few more days ?             Patient currently is not medically stable to d/c. ?  Difficult to place patient No ? ? ? ? ?Infusions:  ? ampicillin-sulbactam (UNASYN) IV 3 g (08/01/21 1322)  ? ? ?Scheduled Meds: ? acetaminophen  500 mg Oral QID  ? brimonidine  1 drop Both Eyes Daily  ? Chlorhexidine Gluconate Cloth  6 each Topical Daily  ? clonazePAM  0.5 mg Oral QHS  ? fentaNYL  1 patch Transdermal Q72H  ? ferrous sulfate  325 mg Oral BID WC  ? gabapentin  400 mg Oral TID  ? latanoprost  1 drop Both Eyes QHS  ? levothyroxine  100 mcg Oral Daily  ? loperamide  2 mg Oral QHS  ? magnesium oxide  800 mg Oral BID  ? metoprolol tartrate  12.5 mg Oral BID  ?  sertraline  100 mg Oral q AM  ? sodium chloride flush  10-40 mL Intracatheter Q12H  ? vancomycin  125 mg Oral QHS  ? ? ?PRN meds: ?albuterol, Gerhardt's butt cream, HYDROcodone-acetaminophen, loperamide, melatonin, morphine injection, ondansetron **OR** ondansetron (ZOFRAN) IV, sodium chloride flush  ? ?Antimicrobials: ?Anti-infectives (From admission, onward)  ? ? Start     Dose/Rate Route Frequency Ordered Stop  ? 07/26/21 2200  cefTRIAXone (ROCEPHIN) 2 g in sodium chloride 0.9 % 100 mL IVPB  Status:  Discontinued       ? 2 g ?200 mL/hr over 30 Minutes Intravenous Every 12 hours 07/26/21 1219 07/27/21 1043  ? 07/26/21 1200  Ampicillin-Sulbactam (UNASYN) 3 g in sodium chloride 0.9 % 100 mL IVPB       ? 3 g ?200 mL/hr over 30 Minutes Intravenous  Every 6 hours 07/26/21 1056    ? 07/24/21 2200  vancomycin (VANCOCIN) capsule 125 mg       ? 125 mg Oral Daily at bedtime 07/24/21 1609    ? 07/24/21 2100  cefTRIAXone (ROCEPHIN) 2 g in sodium chloride 0.9 % 100 mL IVPB  Status:  Discontinued       ? 2 g ?200 mL/hr over 30 Minutes Intravenous Every 24 hours 07/24/21 1609 07/26/21 1219  ? 07/24/21 2000  DAPTOmycin (CUBICIN) 600 mg in sodium chloride 0.9 % IVPB  Status:  Discontinued       ? 8 mg/kg ? 72.6 kg ?124 mL/hr over 30 Minutes Intravenous Daily 07/24/21 1609 07/26/21 1056  ? 07/24/21 1800  vancomycin (VANCOCIN) 50 mg/mL oral solution SOLN 125 mg  Status:  Discontinued       ? 125 mg Oral Every 6 hours 07/24/21 1607 07/24/21 1609  ? 07/24/21 1700  cefTRIAXone (ROCEPHIN) 1 g in sodium chloride 0.9 % 100 mL IVPB  Status:  Discontinued       ? 1 g ?200 mL/hr over 30 Minutes Intravenous Every 24 hours 07/24/21 1607 07/24/21 1609  ? ?  ? ? ?Objective: ?Vitals:  ? 08/01/21 1100 08/01/21 1427  ?BP: 96/61 125/75  ?Pulse: 67 68  ?Resp: 15 15  ?Temp: 98 ?F (36.7 ?C) 98.1 ?F (36.7 ?C)  ?SpO2: 95% 94%  ? ? ?Intake/Output Summary (Last 24 hours) at 08/01/2021 1528 ?Last data filed at 08/01/2021 1245 ?Gross per 24 hour  ?Intake 850 ml   ?Output --  ?Net 850 ml  ? ? ?Filed Weights  ? 07/30/21 0320 07/31/21 0700 08/01/21 0500  ?Weight: 73.9 kg 73.4 kg 73.4 kg  ? ?Weight change: 0 kg ?Body mass index is 30.58 kg/m?.  ? ?Physical Exam: ?General exam: P

## 2021-08-02 DIAGNOSIS — M4646 Discitis, unspecified, lumbar region: Secondary | ICD-10-CM | POA: Diagnosis not present

## 2021-08-02 LAB — CBC WITH DIFFERENTIAL/PLATELET
Abs Immature Granulocytes: 0.04 10*3/uL (ref 0.00–0.07)
Basophils Absolute: 0 10*3/uL (ref 0.0–0.1)
Basophils Relative: 0 %
Eosinophils Absolute: 0.6 10*3/uL — ABNORMAL HIGH (ref 0.0–0.5)
Eosinophils Relative: 6 %
HCT: 34.7 % — ABNORMAL LOW (ref 36.0–46.0)
Hemoglobin: 11 g/dL — ABNORMAL LOW (ref 12.0–15.0)
Immature Granulocytes: 0 %
Lymphocytes Relative: 22 %
Lymphs Abs: 2.3 10*3/uL (ref 0.7–4.0)
MCH: 28.6 pg (ref 26.0–34.0)
MCHC: 31.7 g/dL (ref 30.0–36.0)
MCV: 90.1 fL (ref 80.0–100.0)
Monocytes Absolute: 1 10*3/uL (ref 0.1–1.0)
Monocytes Relative: 10 %
Neutro Abs: 6.2 10*3/uL (ref 1.7–7.7)
Neutrophils Relative %: 62 %
Platelets: 247 10*3/uL (ref 150–400)
RBC: 3.85 MIL/uL — ABNORMAL LOW (ref 3.87–5.11)
RDW: 17.6 % — ABNORMAL HIGH (ref 11.5–15.5)
WBC: 10.2 10*3/uL (ref 4.0–10.5)
nRBC: 0 % (ref 0.0–0.2)

## 2021-08-02 LAB — BASIC METABOLIC PANEL
Anion gap: 8 (ref 5–15)
BUN: 8 mg/dL (ref 8–23)
CO2: 28 mmol/L (ref 22–32)
Calcium: 9.2 mg/dL (ref 8.9–10.3)
Chloride: 102 mmol/L (ref 98–111)
Creatinine, Ser: 1.07 mg/dL — ABNORMAL HIGH (ref 0.44–1.00)
GFR, Estimated: 50 mL/min — ABNORMAL LOW (ref >60)
Glucose, Bld: 101 mg/dL — ABNORMAL HIGH (ref 70–99)
Potassium: 4.5 mmol/L (ref 3.5–5.1)
Sodium: 138 mmol/L (ref 135–145)

## 2021-08-02 NOTE — Progress Notes (Signed)
?PROGRESS NOTE ? ?Brooke Beasley  ?DOB: June 20, 1930  ?PCP: [name], MD ?KYH:[id number]  ?DOA: 07/23/2021 ? LOS: 9 days  ?Hospital Day: 11 ? ?Brief narrative: ?Brooke Beasley is a 88 y.o. female with PMH significant for HTN, HLD, carotid stenosis, CKD 3, recurrent C. difficile infection, anxiety/depression, hiatal hernia, GERD, hypothyroidism, osteoarthritis who had multiple prior admissions for syncope with falls and fractures. ?07/13/2021, patient was seen at the PCPs office with 2 months of intermittent discomfort and low back pain worsening over a period of 2 weeks.  With the suspicion of right sciatica, she was given steroid taper and as needed hydrocodone.  She also recently completed 2 weeks course of oral Augmentin. ? ?07/23/2021, because of unrelenting pain, outpatient MRI was performed which showed phlegmon versus abscess L5-S1 with T1 abnormality  ?Patient was sent to the ED ? ?In the ED, patient was in new onset A-fib ?Blood culture was drawn, patient was started on broad-spectrum antibiotics. ?Admitted to hospitalist service.  ID was consulted. ?3/31, follow-up MRI with contrast showed a 4 mm epidural phlegmon without drainable component. ?3/31, underwent disc aspiration that grew Enterococcus.  Following CT scan showed decrease in the size of seroma. ?4/2, started endocarditis coverage with Unasyn ?4/3, TEE negative for endocarditis antibiotics adjusted by ID, PICC line placed by vascular access team ?4/7, MRI lumbar and sacral spine were repeated because of persistent pain and elevated inflammatory markers.  8 showed continued discitis osteomyelitis and L5-S1 level.  It also showed 6 cm size collection posterior to the rectum likely an abscess. ?4/10, IR plans to drain the post rectal abscess ? ?Subjective: ?Patient was seen and examined this morning.   ?Lying down in bed.  3-4 episodes of loose bowel movement last 24 hours.  Patient is not using Imodium twice daily as needed.   Encouraged to use. ?Pending needle aspiration by IR on Monday. ?Family members at bedside. ? ?Principal Problem: ?  Lumbar discitis ?Active Problems: ?  Right-sided low back pain with right-sided sciatica ?  New onset a-fib [facility]) ?  Valvular heart disease ?  Hypothyroidism ?  HLD (hyperlipidemia) ?  Hypertension, essential ?  Stage 3a chronic kidney disease (CKD) ([place]) ?  Disc disorder ?  ? 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   ?-Status post IR guided disc aspiration 3/31---fluid culture grew Enterococcus.  TEE negative for endocarditis. ?-Patient continues to have pain and elevated inflammatory markers.  Lumbosacral MRI repeated today.  Findings as above showing persistent discitis, osteomyelitis and L5-S1 level and worsening size of abscess in pelvis posterior to the rectum. ?-Discussed with ID and IR.  IR plans to do needle aspiration on Monday.  Eliquis held as recommended.  ?-Currently only Unasyn --> PICC line placed 4/3-stop date antibiotics 09/03/2021 (6 weeks) ?-Currently getting pain control with fentanyl patch, IV morphine as needed, Norco 08/27/2023 q4 prn, Tylenol 500 mg 4 times daily, gabapentin 400 mg 3 times daily.  ? ?Diarrhea ?-Patient has history of prior recurrent C. Difficile requiring prolonged prior vancomycin taper plus Zinplava 04/2019 ?-Currently on secondary C. difficile prophylaxis with vancomycin 125 p.o. daily till 5/17 ?-Last few days, patient has 3-4 episodes of loose bowel movement every day.  Imodium as needed ordered.  But patient is reluctant to use. ? ?New onset A-fib ?-Likely due to infection.  Currently on metoprolol 12.5 mg twice daily ?-CHA2DS2-VASc score=5.  Eliquis was started but currently on hold for IR procedure on Monday ?-Outpatient follow-up  Dr. [name] for aortic valve stenosis as needed ? ?Subclinical hypothyroidism ?-Continue Synthroid 100 mcg dose ? ?Essential hypertension  ?CKD 3B ?-Blood pressure  controlled.  Continue to hold irbesartan, Lasix, Aldactone ?Recent Labs  ?  07/23/21 ?1555 07/24/21 ?7341 07/25/21 ?9379 07/26/21 ?0240 07/27/21 ?9735 07/28/21 ?3299 07/29/21 ?2426 07/30/21 ?8341 08/01/21 ?9622 08/02/21 ?2979  ?BUN '23 15 13 12 12 12 16 12 9 8  '$ ?CREATININE 0.90 1.04* 1.05* 0.94 0.77 0.78 0.93 0.85 0.98 1.07*  ? ? ?Anxiety ?-Klonopin 0.5 at bedtime and Zoloft in the morning ? ?Goals of care ?  Code Status: DNR  ? ? ?Mobility: PT eval obtained.  Home with PT recommended. ? ?Nutritional status:  ?Body mass index is 30.58 kg/m?.  ?  ?  ? ? ? ? ?Diet:  ?Diet Order   ? ?       ?  Diet NPO time specified Except for: Sips with Meds  Diet effective midnight       ?  ?  Diet regular Room service appropriate? Yes; Fluid consistency: Thin  Diet effective now       ?  ? ?  ?  ? ?  ? ? ?DVT prophylaxis:  ?SCDs Start: 07/23/21 2133 ?  ?Antimicrobials: IV Unasyn ?Fluid: None ?Consultants: ID ?Family Communication: Grandkids at bedside ? ?Status is: inpatient ? ?Continue in-hospital care because: Back pain not improving yet.  New MRI findings.  Pending IR aspiration on 4/10. ?Level of care: Telemetry Surgical  ? ?Dispo: The patient is from: Home ?             Anticipated d/c is to: CIR denied by insurance.  Home with home health PT after clinical improvement.  May need PT reeval if remains in hospital few more days ?             Patient currently is not medically stable to d/c. ?  Difficult to place patient No ? ? ? ? ?Infusions:  ? ampicillin-sulbactam (UNASYN) IV 3 g (08/02/21 8921)  ? ? ?Scheduled Meds: ? acetaminophen  500 mg Oral QID  ? brimonidine  1 drop Both Eyes Daily  ? Chlorhexidine Gluconate Cloth  6 each Topical Daily  ? clonazePAM  0.5 mg Oral QHS  ? fentaNYL  1 patch Transdermal Q72H  ? ferrous sulfate  325 mg Oral BID WC  ? gabapentin  400 mg Oral TID  ? latanoprost  1 drop Both Eyes QHS  ? levothyroxine  100 mcg Oral Daily  ? loperamide  2 mg Oral QHS  ? magnesium oxide  800 mg Oral BID  ? metoprolol  tartrate  12.5 mg Oral BID  ? sertraline  100 mg Oral q AM  ? sodium chloride flush  10-40 mL Intracatheter Q12H  ? vancomycin  125 mg Oral QHS  ? ? ?PRN meds: ?albuterol, Gerhardt's butt cream, HYDROcodone-acetaminophen, loperamide, melatonin, morphine injection, ondansetron **OR** ondansetron (ZOFRAN) IV, sodium chloride flush  ? ?Antimicrobials: ?Anti-infectives (From admission, onward)  ? ? Start     Dose/Rate Route Frequency Ordered Stop  ? 07/26/21 2200  cefTRIAXone (ROCEPHIN) 2 g in sodium chloride 0.9 % 100 mL IVPB  Status:  Discontinued       ? 2 g ?200 mL/hr over 30 Minutes Intravenous Every 12 hours 07/26/21 1219 07/27/21 1043  ? 07/26/21 1200  Ampicillin-Sulbactam (UNASYN) 3 g in sodium chloride 0.9 % 100 mL IVPB       ? 3 g ?200 mL/hr over 30 Minutes Intravenous  Every 6 hours 07/26/21 1056    ? 07/24/21 2200  vancomycin (VANCOCIN) capsule 125 mg       ? 125 mg Oral Daily at bedtime 07/24/21 1609    ? 07/24/21 2100  cefTRIAXone (ROCEPHIN) 2 g in sodium chloride 0.9 % 100 mL IVPB  Status:  Discontinued       ? 2 g ?200 mL/hr over 30 Minutes Intravenous Every 24 hours 07/24/21 1609 07/26/21 1219  ? 07/24/21 2000  DAPTOmycin (CUBICIN) 600 mg in sodium chloride 0.9 % IVPB  Status:  Discontinued       ? 8 mg/kg ? 72.6 kg ?124 mL/hr over 30 Minutes Intravenous Daily 07/24/21 1609 07/26/21 1056  ? 07/24/21 1800  vancomycin (VANCOCIN) 50 mg/mL oral solution SOLN 125 mg  Status:  Discontinued       ? 125 mg Oral Every 6 hours 07/24/21 1607 07/24/21 1609  ? 07/24/21 1700  cefTRIAXone (ROCEPHIN) 1 g in sodium chloride 0.9 % 100 mL IVPB  Status:  Discontinued       ? 1 g ?200 mL/hr over 30 Minutes Intravenous Every 24 hours 07/24/21 1607 07/24/21 1609  ? ?  ? ? ?Objective: ?Vitals:  ? 08/02/21 0306 08/02/21 0725  ?BP: (!) 156/92 (!) 169/74  ?Pulse: 71 74  ?Resp: 17 13  ?Temp: 98 ?F (36.7 ?C) 97.9 ?F (36.6 ?C)  ?SpO2: 92% 94%  ? ? ?Intake/Output Summary (Last 24 hours) at 08/02/2021 1101 ?Last data filed at 08/01/2021  2123 ?Gross per 24 hour  ?Intake 250 ml  ?Output --  ?Net 250 ml  ? ? ?Filed Weights  ? 07/31/21 0700 08/01/21 0500 08/02/21 0500  ?Weight: 73.4 kg 73.4 kg 73.4 kg  ? ?Weight change: 0 kg ?Body mass index is

## 2021-08-03 ENCOUNTER — Inpatient Hospital Stay (HOSPITAL_COMMUNITY): Payer: PPO

## 2021-08-03 DIAGNOSIS — K611 Rectal abscess: Secondary | ICD-10-CM | POA: Diagnosis present

## 2021-08-03 DIAGNOSIS — L0291 Cutaneous abscess, unspecified: Secondary | ICD-10-CM | POA: Diagnosis present

## 2021-08-03 DIAGNOSIS — M4646 Discitis, unspecified, lumbar region: Secondary | ICD-10-CM | POA: Diagnosis not present

## 2021-08-03 DIAGNOSIS — I4891 Unspecified atrial fibrillation: Secondary | ICD-10-CM | POA: Diagnosis not present

## 2021-08-03 DIAGNOSIS — A0471 Enterocolitis due to Clostridium difficile, recurrent: Secondary | ICD-10-CM

## 2021-08-03 HISTORY — DX: Rectal abscess: K61.1

## 2021-08-03 LAB — BASIC METABOLIC PANEL
Anion gap: 6 (ref 5–15)
BUN: 9 mg/dL (ref 8–23)
CO2: 31 mmol/L (ref 22–32)
Calcium: 9.2 mg/dL (ref 8.9–10.3)
Chloride: 99 mmol/L (ref 98–111)
Creatinine, Ser: 1.14 mg/dL — ABNORMAL HIGH (ref 0.44–1.00)
GFR, Estimated: 46 mL/min — ABNORMAL LOW (ref >60)
Glucose, Bld: 120 mg/dL — ABNORMAL HIGH (ref 70–99)
Potassium: 4.8 mmol/L (ref 3.5–5.1)
Sodium: 136 mmol/L (ref 135–145)

## 2021-08-03 LAB — CBC WITH DIFFERENTIAL/PLATELET
Abs Immature Granulocytes: 0.04 10*3/uL (ref 0.00–0.07)
Basophils Absolute: 0 10*3/uL (ref 0.0–0.1)
Basophils Relative: 0 %
Eosinophils Absolute: 0.6 10*3/uL — ABNORMAL HIGH (ref 0.0–0.5)
Eosinophils Relative: 7 %
HCT: 32.7 % — ABNORMAL LOW (ref 36.0–46.0)
Hemoglobin: 10.6 g/dL — ABNORMAL LOW (ref 12.0–15.0)
Immature Granulocytes: 1 %
Lymphocytes Relative: 24 %
Lymphs Abs: 2.1 10*3/uL (ref 0.7–4.0)
MCH: 29 pg (ref 26.0–34.0)
MCHC: 32.4 g/dL (ref 30.0–36.0)
MCV: 89.3 fL (ref 80.0–100.0)
Monocytes Absolute: 0.9 10*3/uL (ref 0.1–1.0)
Monocytes Relative: 10 %
Neutro Abs: 4.9 10*3/uL (ref 1.7–7.7)
Neutrophils Relative %: 58 %
Platelets: 259 10*3/uL (ref 150–400)
RBC: 3.66 MIL/uL — ABNORMAL LOW (ref 3.87–5.11)
RDW: 17.5 % — ABNORMAL HIGH (ref 11.5–15.5)
WBC: 8.5 10*3/uL (ref 4.0–10.5)
nRBC: 0 % (ref 0.0–0.2)

## 2021-08-03 MED ORDER — SODIUM CHLORIDE 0.9 % IV BOLUS
500.0000 mL | Freq: Once | INTRAVENOUS | Status: AC
  Administered 2021-08-03: 500 mL via INTRAVENOUS

## 2021-08-03 MED ORDER — MIDAZOLAM HCL 2 MG/2ML IJ SOLN
INTRAMUSCULAR | Status: AC
Start: 2021-08-03 — End: 2021-08-03
  Filled 2021-08-03: qty 2

## 2021-08-03 MED ORDER — LIDOCAINE HCL 1 % IJ SOLN
INTRAMUSCULAR | Status: AC
  Filled 2021-08-03: qty 10

## 2021-08-03 MED ORDER — METOPROLOL TARTRATE 25 MG PO TABS
25.0000 mg | ORAL_TABLET | Freq: Two times a day (BID) | ORAL | Status: DC
Start: 2021-08-04 — End: 2021-08-04
  Administered 2021-08-04: 25 mg via ORAL
  Filled 2021-08-03: qty 1

## 2021-08-03 MED ORDER — METOPROLOL TARTRATE 25 MG PO TABS: 25.0000 mg | ORAL_TABLET | Freq: Two times a day (BID) | ORAL | Status: DC

## 2021-08-03 MED ORDER — FENTANYL CITRATE (PF) 100 MCG/2ML IJ SOLN
INTRAMUSCULAR | Status: AC | PRN
  Administered 2021-08-03: 25 ug via INTRAVENOUS

## 2021-08-03 MED ORDER — FENTANYL CITRATE (PF) 100 MCG/2ML IJ SOLN
INTRAMUSCULAR | Status: AC
  Filled 2021-08-03: qty 2

## 2021-08-03 MED ORDER — MIDAZOLAM HCL 2 MG/2ML IJ SOLN
INTRAMUSCULAR | Status: AC | PRN
Start: 2021-08-03 — End: 2021-08-03
  Administered 2021-08-03: .5 mg via INTRAVENOUS

## 2021-08-03 NOTE — Hospital Course (Addendum)
88 y.o. female with PMH significant for HTN, HLD, carotid stenosis, CKD 3, recurrent C. difficile infection, anxiety/depression, hiatal hernia, GERD, hypothyroidism, osteoarthritis who had multiple prior admissions for syncope with falls and fractures. ?On 07/13/2021 patient was seen at the PCPs office with 2 months of intermittent discomfort and low back pain worsening over a period of 2 weeks.  With the suspicion of right sciatica, she was given steroid taper and as needed hydrocodone.  She also recently completed 2 weeks course of oral Augmentin. On 07/23/2021, because of unrelenting pain, outpatient MRI was performed which showed phlegmon versus abscess L5-S1 with T1 abnormality. Patient was sent to the ED. she was hospitalized for further management.  Noted to be in new onset atrial fibrillation. ?  ?3/31, follow-up MRI with contrast showed a 4 mm epidural phlegmon without drainable component. ?3/31, underwent disc aspiration that grew Enterococcus.  Following CT scan showed decrease in the size of seroma. ?4/2, started endocarditis coverage with Unasyn ?4/3, TEE negative for endocarditis antibiotics adjusted by ID, PICC line placed by vascular access team ?4/7, MRI lumbar and sacral spine were repeated because of persistent pain and elevated inflammatory markers.  8 showed continued discitis osteomyelitis and L5-S1 level.  It also showed 6 cm size collection posterior to the rectum likely an abscess. ?4/10: CT-guided aspiration of pelvic fluid collection posterior to the rectum. ?

## 2021-08-03 NOTE — Assessment & Plan Note (Addendum)
-  Patient has history of prior recurrent C. Difficile requiring prolonged prior vancomycin taper plus Zinplava 04/2019 ?-Currently on secondary C. difficile prophylaxis with vancomycin 125 p.o. daily?as per ID. ?Can use Imodium as needed for diarrhea. ?

## 2021-08-03 NOTE — Progress Notes (Signed)
Occupational Therapy Treatment ?Patient Details ?Name: Brooke Beasley ?MRN: [id number] ?DOB: 02-Aug-1930 ?Today's Date: 08/03/2021 ? ? ?History of present illness Pt is an 88 y.o. female who presented 07/23/21 with a 2 month hx of increasing low back pain with radiation down her R leg. Per chart, her symptoms began after she underwent rectal prolapse surgery in January of 2023. MRI was concerning for L5-S1 discitis/osteomyelitis. S/p L5-S1 disc aspiration 3/31. PMH: carotid stenosis, CKD, diverticulosis, glaucoma, heart murmur, HTN, HLD, hypothyroidism, pseudogout, osteoporosis ?  ?OT comments ? Brooke Beasley is making incremental progress but continues to be limited by LB and RLE pain, especially in sitting and standing. Overall she required up to min A for mobility, but transfers with min guard. She prefers BUE supported for pain management in standing, however she can complete grooming in standing with only 1 UE supported. Pt and her daughter are hoping for some pain relief from planned procedure today. OT to continue follow s/p procedure to evaluate furtehr d/c needs. Recommend d/c to home with support of family and HHOT at this time.   ? ?Recommendations for follow up therapy are one component of a multi-disciplinary discharge planning process, led by the attending physician.  Recommendations may be updated based on patient status, additional functional criteria and insurance authorization. ?   ?Follow Up Recommendations ? Home health OT  ?  ?Assistance Recommended at Discharge Frequent or constant Supervision/Assistance  ?Patient can return home with the following ? A little help with walking and/or transfers;A little help with bathing/dressing/bathroom;Assistance with cooking/housework;Direct supervision/assist for medications management;Direct supervision/assist for financial management;Assist for transportation;Help with stairs or ramp for entrance ?  ?Equipment Recommendations ? None recommended by OT (pt may  benefit from AE to increase indep with ADLs)  ?  ?Recommendations for Other Services   ? ?  ?Precautions / Restrictions Precautions ?Precautions: Fall ?Precaution Comments: back precautions for comfort ?Restrictions ?Weight Bearing Restrictions: No  ? ? ?  ? ?Mobility Bed Mobility ?Overal bed mobility: Needs Assistance ?Bed Mobility: Rolling, Sidelying to Sit, Sit to Sidelying ?Rolling: Supervision ?Sidelying to sit: Min guard ?  ?  ?Sit to sidelying: Min assist ?General bed mobility comments: min A for getting RLE back into bed. pt has good knowledge of log roll ?  ? ?Transfers ?Overall transfer level: Needs assistance ?Equipment used: Rolling walker (2 wheels) ?Transfers: Sit to/from Stand ?Sit to Stand: Min guard ?  ?  ?  ?  ?  ?General transfer comment: pain limiting and pt stating "I can't" but is able to with min guard; eager to participate but anxious for pain. ?  ?  ?Balance Overall balance assessment: Needs assistance ?Sitting-balance support: Feet supported ?Sitting balance-Leahy Scale: Fair ?Sitting balance - Comments: prefers to lean to L in sitting to avoid pain R sciatic distribution ?  ?Standing balance support: Single extremity supported, During functional activity ?Standing balance-Leahy Scale: Fair ?Standing balance comment: can stand for a few minutes with 1UE supported while completeing functional task ?  ?  ?  ?  ?  ?  ?  ?  ?  ?  ?  ?   ? ?ADL either performed or assessed with clinical judgement  ? ?ADL Overall ADL's : Needs assistance/impaired ?  ?  ?Grooming: Set up;Sitting ?  ?  ?  ?  ?  ?  ?  ?  ?  ?Toilet Transfer: Minimal assistance;Rolling walker (2 wheels) ?  ?  ?  ?  ?  ?Functional mobility during ADLs: Minimal  assistance;Rolling walker (2 wheels) ?General ADL Comments: continues to be limited by back and RLE pain in sitting and in standing. Can only tolerate sitting/standing in one position for a minute at a time; finds comfort in L lateral leaning ?  ? ?Extremity/Trunk Assessment  Upper Extremity Assessment ?Upper Extremity Assessment: LUE deficits/detail ?LUE Deficits / Details: hx of shoulder sx & rotator cuff injury. Pt extremely limited in ROM of soulder ?LUE Sensation: WNL ?LUE Coordination: decreased gross motor ?  ?Lower Extremity Assessment ?Lower Extremity Assessment: Defer to PT evaluation ?  ?  ?  ? ?Vision   ?Vision Assessment?: No apparent visual deficits ?  ?Perception Perception ?Perception: Within Functional Limits ?  ?Praxis   ?  ? ?Cognition Arousal/Alertness: Awake/alert ?Behavior During Therapy: [facility] for tasks assessed/performed ?Overall Cognitive Status: Within Functional Limits for tasks assessed ?  ?  ?  ?  ?  ?  ?  ?  ?  ?  ?  ?  ?  ?  ?  ?  ?General Comments: Distracted by pain often, but otherwise [facility] for tasks assessed. ?  ?  ?   ?Exercises   ? ?  ?Shoulder Instructions   ? ? ?  ?General Comments VSS on RA, daughter present. Pt and daughter hopeful for pain relief after IR today  ? ? ?Pertinent Vitals/ Pain       Pain Assessment ?Pain Assessment: Faces ?Faces Pain Scale: Hurts even more ?Pain Location: back, R leg ?Pain Descriptors / Indicators: Discomfort, Grimacing, Guarding ?Pain Intervention(s): Limited activity within patient's tolerance, Monitored during session ? ?Home Living   ?  ?  ?  ?  ?  ?  ?  ?  ?  ?  ?  ?  ?  ?  ?  ?  ?  ?  ? ?  ?Prior Functioning/Environment    ?  ?  ?  ?   ? ?Frequency ? Min 2X/week  ? ? ? ? ?  ?Progress Toward Goals ? ?OT Goals(current goals can now be found in the care plan section) ? Progress towards OT goals: Progressing toward goals ? ?Acute Rehab OT Goals ?Patient Stated Goal: less pain ?OT Goal Formulation: With patient ?Time For Goal Achievement: 08/11/21 ?Potential to Achieve Goals: Good ?ADL Goals ?Pt Will Perform Lower Body Bathing: with modified independence;sit to/from stand ?Pt Will Perform Lower Body Dressing: with modified independence;sit to/from stand ?Pt Will Transfer to Toilet: with modified  independence;ambulating ?Pt Will Perform Toileting - Clothing Manipulation and hygiene: with modified independence;sit to/from stand  ?Plan Discharge plan needs to be updated   ? ?Co-evaluation ? ? ?   ?  ?  ?  ?  ? ?  ?AM-PAC OT "6 Clicks" Daily Activity     ?Outcome Measure ? ? Help from another person eating meals?: A Little ?Help from another person taking care of personal grooming?: A Little ?Help from another person toileting, which includes using toliet, bedpan, or urinal?: A Lot ?Help from another person bathing (including washing, rinsing, drying)?: A Lot ?Help from another person to put on and taking off regular upper body clothing?: A Little ?Help from another person to put on and taking off regular lower body clothing?: A Lot ?6 Click Score: 15 ? ?  ?End of Session Equipment Utilized During Treatment: Rolling walker (2 wheels) ? ?OT Visit Diagnosis: Unsteadiness on feet (R26.81);Other abnormalities of gait and mobility (R26.89);Muscle weakness (generalized) (M62.81);History of falling (Z91.81);Pain ?  ?Activity Tolerance Patient tolerated treatment  well ?  ?Patient Left in bed;with call bell/phone within reach;with bed alarm set ?  ?Nurse Communication Mobility status ?  ? ?   ? ?Time: 5320-2334 ?OT Time Calculation (min): 19 min ? ?Charges: OT General Charges ?$OT Visit: 1 Visit ?OT Treatments ?$Self Care/Home Management : 8-22 mins ? ? ? ?Devynn Scheff A Maxima Skelton ?08/03/2021, 8:51 AM ?

## 2021-08-03 NOTE — Progress Notes (Signed)
? ?[facility] ?PROGRESS NOTE ? ? ?Brooke Beasley QAS:[id number] DOB: Jun 20, 1930 DOA: 07/23/2021  10 ?DOS: the patient was seen and examined on 08/03/2021 ? ?PCP: [name], MD ? ?Brief History and Hospital Course:  ?88 y.o. female with PMH significant for HTN, HLD, carotid stenosis, CKD 3, recurrent C. difficile infection, anxiety/depression, hiatal hernia, GERD, hypothyroidism, osteoarthritis who had multiple prior admissions for syncope with falls and fractures. ?On 07/13/2021 patient was seen at the PCPs office with 2 months of intermittent discomfort and low back pain worsening over a period of 2 weeks.  With the suspicion of right sciatica, she was given steroid taper and as needed hydrocodone.  She also recently completed 2 weeks course of oral Augmentin. On 07/23/2021, because of unrelenting pain, outpatient MRI was performed which showed phlegmon versus abscess L5-S1 with T1 abnormality. Patient was sent to the ED. she was hospitalized for further management.  Noted to be in new onset atrial fibrillation. ?  ?3/31, follow-up MRI with contrast showed a 4 mm epidural phlegmon without drainable component. ?3/31, underwent disc aspiration that grew Enterococcus.  Following CT scan showed decrease in the size of seroma. ?4/2, started endocarditis coverage with Unasyn ?4/3, TEE negative for endocarditis antibiotics adjusted by ID, PICC line placed by vascular access team ?4/7, MRI lumbar and sacral spine were repeated because of persistent pain and elevated inflammatory markers.  8 showed continued discitis osteomyelitis and L5-S1 level.  It also showed 6 cm size collection posterior to the rectum likely an abscess. ?4/10, IR plans to drain the post rectal abscess ? ?Consultants: Infectious disease.  Interventional radiology ? ?Procedures: Disc aspiration on 3/31 ? ? ? ?Subjective: ?Patient complains of pain in the right leg which has been ongoing for past few weeks.  Denies any new complaints.   Slightly anxious.  No chest pain shortness of breath.  No nausea or vomiting. ? ? ? ?Assessment/Plan: ? ?* Discitis ?Abscess ? ?-Status post IR guided disc aspiration 3/31---fluid culture grew Enterococcus.  TEE negative for endocarditis. ?-Patient continues to have pain and elevated inflammatory markers.  Lumbosacral MRI repeated and it showed persistent discitis, osteomyelitis and L5-S1 level and worsening size of abscess in pelvis posterior to the rectum. ?-Discussed with ID and IR.    IR to do needle aspiration on 4/10.  Eliquis has been held. ?-Currently only Unasyn --> PICC line placed 4/3-stop date antibiotics 09/03/2021 (6 weeks) ?-Currently getting pain control with fentanyl patch, IV morphine as needed, Norco 08/27/2023 q4 prn, Tylenol 500 mg 4 times daily, gabapentin 400 mg 3 times daily.  ?PT and OT evaluation.  Home health is recommended. ? ?Right-sided low back pain with right-sided sciatica ?Likely due to discitis.  Physical therapy is ongoing.   ? ?New onset a-fib [facility]) ?-Likely due to infection.  Currently on metoprolol 12.5 mg twice daily ?-CHA2DS2-VASc score=5.   ?Eliquis was started but currently on hold for IR procedure on Monday ?Echocardiogram shows normal systolic function with mild to moderate aortic valve stenosis. ?TSH noted to be mildly elevated at 6.63.  Free T4 0.77.  Patient already on levothyroxine for hypothyroidism.  No dose changes needed. ?-Outpatient follow-up Dr. [name] for aortic valve stenosis as needed ? ? ?Stage 3a chronic kidney disease (CKD) ([place]) ?Avoid nephrotoxic agents.  Renal function stable for the most part. ?Mildly elevated creatinine noted today.  We will recheck tomorrow. ? ?Hypertension, essential ?Stable.  Holding irbesartan Lasix and Aldactone.  Currently not on any antihypertensives except for metoprolol for atrial  fibrillation.  Elevated blood pressure readings noted.  Increase the dose of metoprolol which she seems to be tolerating well.  Allergy noted to  amlodipine, ACE inhibitor, carvedilol, losartan. ? ?Hypothyroidism ?Continue levothyroxine.  Mildly elevated TSH noted with normal free T4. ? ?Recurrent Clostridioides difficile diarrhea ?-Patient has history of prior recurrent C. Difficile requiring prolonged prior vancomycin taper plus Zinplava 04/2019 ?-Currently on secondary C. difficile prophylaxis with vancomycin 125 p.o. daily till 5/17 ?Can use Imodium as needed for diarrhea. ? ? ?DVT Prophylaxis: Eliquis on hold ?Code Status: DNR ?Family Communication: No family at bedside ?Disposition Plan: Home health when ready for discharge ? ?Status is: Inpatient ?Remains inpatient appropriate because: Need for procedures, need for IV antibiotics ? ? ? ? ?Medications: Scheduled: ? acetaminophen  500 mg Oral QID  ? brimonidine  1 drop Both Eyes Daily  ? Chlorhexidine Gluconate Cloth  6 each Topical Daily  ? clonazePAM  0.5 mg Oral QHS  ? fentaNYL  1 patch Transdermal Q72H  ? ferrous sulfate  325 mg Oral BID WC  ? gabapentin  400 mg Oral TID  ? latanoprost  1 drop Both Eyes QHS  ? levothyroxine  100 mcg Oral Daily  ? loperamide  2 mg Oral QHS  ? magnesium oxide  800 mg Oral BID  ? metoprolol tartrate  12.5 mg Oral BID  ? sertraline  100 mg Oral q AM  ? sodium chloride flush  10-40 mL Intracatheter Q12H  ? vancomycin  125 mg Oral QHS  ? ?Continuous: ? ampicillin-sulbactam (UNASYN) IV 3 g (08/03/21 3790)  ? ?WIO:[id number], Gerhardt's butt cream, HYDROcodone-acetaminophen, loperamide, melatonin, morphine injection, ondansetron **OR** ondansetron (ZOFRAN) IV, sodium chloride flush ? ?Antibiotics: ?Anti-infectives (From admission, onward)  ? ? Start     Dose/Rate Route Frequency Ordered Stop  ? 07/26/21 2200  cefTRIAXone (ROCEPHIN) 2 g in sodium chloride 0.9 % 100 mL IVPB  Status:  Discontinued       ? 2 g ?200 mL/hr over 30 Minutes Intravenous Every 12 hours 07/26/21 1219 07/27/21 1043  ? 07/26/21 1200  Ampicillin-Sulbactam (UNASYN) 3 g in sodium chloride 0.9 % 100 mL IVPB        ? 3 g ?200 mL/hr over 30 Minutes Intravenous Every 6 hours 07/26/21 1056    ? 07/24/21 2200  vancomycin (VANCOCIN) capsule 125 mg       ? 125 mg Oral Daily at bedtime 07/24/21 1609    ? 07/24/21 2100  cefTRIAXone (ROCEPHIN) 2 g in sodium chloride 0.9 % 100 mL IVPB  Status:  Discontinued       ? 2 g ?200 mL/hr over 30 Minutes Intravenous Every 24 hours 07/24/21 1609 07/26/21 1219  ? 07/24/21 2000  DAPTOmycin (CUBICIN) 600 mg in sodium chloride 0.9 % IVPB  Status:  Discontinued       ? 8 mg/kg ? 72.6 kg ?124 mL/hr over 30 Minutes Intravenous Daily 07/24/21 1609 07/26/21 1056  ? 07/24/21 1800  vancomycin (VANCOCIN) 50 mg/mL oral solution SOLN 125 mg  Status:  Discontinued       ? 125 mg Oral Every 6 hours 07/24/21 1607 07/24/21 1609  ? 07/24/21 1700  cefTRIAXone (ROCEPHIN) 1 g in sodium chloride 0.9 % 100 mL IVPB  Status:  Discontinued       ? 1 g ?200 mL/hr over 30 Minutes Intravenous Every 24 hours 07/24/21 1607 07/24/21 1609  ? ?  ? ? ?Objective: ? ?Vital Signs ? ?Vitals:  ? 08/02/21 2304 08/03/21 0405 08/03/21  0500 08/03/21 0733  ?BP: (!) 150/76 (!) 157/67  (!) 168/83  ?Pulse: 71 68  75  ?Resp: '15 19  20  '$ ?Temp: 98.5 ?F (36.9 ?C) 98.4 ?F (36.9 ?C)  97.9 ?F (36.6 ?C)  ?TempSrc: Oral Oral    ?SpO2: 94% 93%  93%  ?Weight:   73.4 kg   ?Height:      ? ? ?Intake/Output Summary (Last 24 hours) at 08/03/2021 1017 ?Last data filed at 08/03/2021 0645 ?Gross per 24 hour  ?Intake 10 ml  ?Output 2500 ml  ?Net -2490 ml  ? ?Filed Weights  ? 08/01/21 0500 08/02/21 0500 08/03/21 0500  ?Weight: 73.4 kg 73.4 kg 73.4 kg  ? ? ?General appearance: Awake alert.  In no distress ?Resp: Clear to auscultation bilaterally.  Normal effort ?Cardio: S1-S2 is normal regular.  No S3-S4.  No rubs murmurs or bruit ?GI: Abdomen is soft.  Nontender nondistended.  Bowel sounds are present normal.  No masses organomegaly ?Extremities: No edema.  Decreased range of motion of the right lower extremity noted. ?Neurologic: Alert and oriented x3.  No focal  neurological deficits.  ? ? ?Lab Results: ? ?Data Reviewed: I have personally reviewed labs and imaging study reports ? ?CBC: ?Recent Labs  ?Lab 07/29/21 ?0356 07/30/21 ?0758 08/01/21 ?0219 08/02/21 ?0

## 2021-08-03 NOTE — Progress Notes (Signed)
?   ? ?[place] [facility] ? ?Date of Admission:  07/23/2021    ?       ?Reason for visit: Follow up on discitis/osteomyelitis ?  ?Current antibiotics: ?Ampicillin sulbactam 4/2--present ?Vancomycin p.o. 3/31-present ?  ?Previous antibiotics: ?Daptomycin 3/31-4/2 ?Ceftriaxone 3/31-4/3 ? ? ? ?ASSESSMENT:   ? ?88 y.o. female admitted with: ? ?Discitis/osteomyelitis L5-S1 secondary to Enterococcus faecalis: Spinal infection initially noted on MRI without contrast 3/30 following several weeks of worsening back pain.  MRI with contrast on 3/31 also noted an additional 4 mm epidural phlegmon without drainable component.  Status post IR disc aspiration 3/31 with cultures yielding Enterococcus faecalis.  Repeat MRI 4/7 due to worsening inflammatory markers and unrelenting back pain significant for expected ongoing discitis/osteomyelitis as well as anterior epidural phlegmon measuring up to 6 mm with a developing small abscess measuring 1.2 x 0.4 cm. ?Intra-abdominal fluid collections: This was initially noted on imaging as an outpatient in early March by her surgeon.  She incidentally also received approximately 2 weeks of Augmentin from her PCP related to a potential respiratory infection from 3/1 through 3/14.  She had a follow-up CT scan on 3/31 which then noted improving fluid collections, however, MRI on 4/7 again noted a rim-enhancing collection posterior to the rectum measuring 6.2 x 2.4 cm consistent with seroma or abscess.  Evaluated by IR and planning for drainage today. ?History of recurrent C. difficile: Currently on oral vancomycin prophylaxis. ?CKD stage III: Slight increase in creatinine today. ?Recent rectal prolapse surgery: Completed on 1/31. ? ?RECOMMENDATIONS:   ? ?Continue Unasyn ?Appreciate IR assistance.  Please send any fluid for routine bacterial cultures ?Continue vancomycin prophylaxis ?Will follow ? ? ?Principal Problem: ?  Lumbar discitis ?Active Problems: ?  Hypothyroidism ?   HLD (hyperlipidemia) ?  Hypertension, essential ?  Valvular heart disease ?  Stage 3a chronic kidney disease (CKD) ([place]) ?  Right-sided low back pain with right-sided sciatica ?  New onset a-fib [facility]) ?  Disc disorder ? ? ? ?MEDICATIONS:   ? ?Scheduled Meds: ? acetaminophen  500 mg Oral QID  ? brimonidine  1 drop Both Eyes Daily  ? Chlorhexidine Gluconate Cloth  6 each Topical Daily  ? clonazePAM  0.5 mg Oral QHS  ? fentaNYL  1 patch Transdermal Q72H  ? ferrous sulfate  325 mg Oral BID WC  ? gabapentin  400 mg Oral TID  ? latanoprost  1 drop Both Eyes QHS  ? levothyroxine  100 mcg Oral Daily  ? loperamide  2 mg Oral QHS  ? magnesium oxide  800 mg Oral BID  ? metoprolol tartrate  12.5 mg Oral BID  ? sertraline  100 mg Oral q AM  ? sodium chloride flush  10-40 mL Intracatheter Q12H  ? vancomycin  125 mg Oral QHS  ? ?Continuous Infusions: ? ampicillin-sulbactam (UNASYN) IV 3 g (08/03/21 1660)  ? ?PRN Meds:.albuterol, Gerhardt's butt cream, HYDROcodone-acetaminophen, loperamide, melatonin, morphine injection, ondansetron **OR** ondansetron (ZOFRAN) IV, sodium chloride flush ? ?SUBJECTIVE:  ? ?24 hour events:  ?No acute events noted overnight ?Plan for IR drainage posterior rectal abscess today ?Tmax 98.8 ?Noted to have some loose bowel movements over the weekend ?Stable labs, mild increased creatinine ?No new micro ?No new imaging ? ?She is lying in bed with her daughter in the room.  She reports ongoing significant back pain.  She is awaiting for IR evaluation this morning. ? ?Review of Systems  ?All other systems reviewed and are negative. ? ?  ?  OBJECTIVE:  ? ?Blood pressure (!) 168/83, pulse 75, temperature 97.9 ?F (36.6 ?C), resp. rate 20, height '5\' 1"'$  (1.549 m), weight 73.4 kg, SpO2 93 %. ?Body mass index is 30.58 kg/m?. ? ?Physical Exam ?Constitutional:   ?   General: She is not in acute distress. ?   Appearance: Normal appearance.  ?HENT:  ?   Head: Normocephalic and atraumatic.  ?Eyes:  ?   Extraocular  Movements: Extraocular movements intact.  ?   Conjunctiva/sclera: Conjunctivae normal.  ?Pulmonary:  ?   Effort: Pulmonary effort is normal. No respiratory distress.  ?Abdominal:  ?   General: There is no distension.  ?   Palpations: Abdomen is soft.  ?Musculoskeletal:  ?   Cervical back: Normal range of motion and neck supple.  ?Skin: ?   General: Skin is warm and dry.  ?Neurological:  ?   General: No focal deficit present.  ?   Mental Status: She is alert and oriented to person, place, and time.  ?Psychiatric:     ?   Mood and Affect: Mood normal.     ?   Behavior: Behavior normal.  ? ? ? ?Lab Results: ?Lab Results  ?Component Value Date  ? WBC 8.5 08/03/2021  ? HGB 10.6 (L) 08/03/2021  ? HCT 32.7 (L) 08/03/2021  ? MCV 89.3 08/03/2021  ? PLT 259 08/03/2021  ?  ?Lab Results  ?Component Value Date  ? NA 136 08/03/2021  ? K 4.8 08/03/2021  ? CO2 31 08/03/2021  ? GLUCOSE 120 (H) 08/03/2021  ? BUN 9 08/03/2021  ? CREATININE 1.14 (H) 08/03/2021  ? CALCIUM 9.2 08/03/2021  ? GFRNONAA 46 (L) 08/03/2021  ? GFRAA 58 (L) 03/28/2019  ?  ?Lab Results  ?Component Value Date  ? ALT 13 07/27/2021  ? AST 17 07/27/2021  ? ALKPHOS 77 07/27/2021  ? BILITOT 0.1 (L) 07/27/2021  ? ? ?   ?Component Value Date/Time  ? CRP 9.5 (H) 07/30/2021 1046  ? ? ?   ?Component Value Date/Time  ? ESRSEDRATE 81 (H) 07/30/2021 1046  ? ?  ?I have reviewed the micro and lab results in Epic. ? ?Imaging: ?No results found.  ? ?Imaging independently reviewed in Epic.  ? ? ?Brooke Beasley ?[facility] ?[place] ?[phone] pager ?08/03/2021, 8:17 AM ? ?I have personally spent 50 minutes involved in face-to-face and non-face-to-face activities for this patient on the day of the visit. Professional time spent includes the following activities: Preparing to see the patient (review of tests), Obtaining and/or reviewing separately obtained history (admission/discharge record), Performing a medically appropriate  examination and/or evaluation , Ordering medications/tests/procedures, referring and communicating with other health care professionals, Documenting clinical information in the EMR, Independently interpreting results (not separately reported), Communicating results to the patient/family/caregiver, Counseling and educating the patient/family/caregiver and Care coordination (not separately reported).  ? ? ?

## 2021-08-03 NOTE — Procedures (Signed)
Interventional Radiology Procedure Note ? ?Date of Procedure: 08/03/2021  ?Procedure: CT guided aspiration of pelvic fluid collection  ? ?Findings:  ?1. CT guided aspiration of pelvic fluid collection yielding 6 ml of purulent material.  Collection too small for drain placement.  ? ?Complications: No immediate complications noted.  ? ?Estimated Blood Loss: minimal ? ?Follow-up and Recommendations: ?1. None  ? ? ?Albin Felling, MD  ?Vascular & Interventional Radiology  ?08/03/2021 12:40 PM ? ? ? ?

## 2021-08-04 ENCOUNTER — Inpatient Hospital Stay (HOSPITAL_COMMUNITY): Payer: PPO

## 2021-08-04 DIAGNOSIS — I4891 Unspecified atrial fibrillation: Secondary | ICD-10-CM | POA: Diagnosis not present

## 2021-08-04 DIAGNOSIS — R052 Subacute cough: Secondary | ICD-10-CM

## 2021-08-04 DIAGNOSIS — M4646 Discitis, unspecified, lumbar region: Secondary | ICD-10-CM | POA: Diagnosis not present

## 2021-08-04 DIAGNOSIS — L0291 Cutaneous abscess, unspecified: Secondary | ICD-10-CM | POA: Diagnosis not present

## 2021-08-04 LAB — BASIC METABOLIC PANEL
Anion gap: 7 (ref 5–15)
BUN: 10 mg/dL (ref 8–23)
CO2: 28 mmol/L (ref 22–32)
Calcium: 9 mg/dL (ref 8.9–10.3)
Chloride: 102 mmol/L (ref 98–111)
Creatinine, Ser: 0.88 mg/dL (ref 0.44–1.00)
GFR, Estimated: >60 mL/min (ref >60)
Glucose, Bld: 105 mg/dL — ABNORMAL HIGH (ref 70–99)
Potassium: 4.2 mmol/L (ref 3.5–5.1)
Sodium: 137 mmol/L (ref 135–145)

## 2021-08-04 LAB — CBC
HCT: 35.4 % — ABNORMAL LOW (ref 36.0–46.0)
Hemoglobin: 11.4 g/dL — ABNORMAL LOW (ref 12.0–15.0)
MCH: 29 pg (ref 26.0–34.0)
MCHC: 32.2 g/dL (ref 30.0–36.0)
MCV: 90.1 fL (ref 80.0–100.0)
Platelets: 258 10*3/uL (ref 150–400)
RBC: 3.93 MIL/uL (ref 3.87–5.11)
RDW: 17.5 % — ABNORMAL HIGH (ref 11.5–15.5)
WBC: 8.7 10*3/uL (ref 4.0–10.5)
nRBC: 0 % (ref 0.0–0.2)

## 2021-08-04 MED ORDER — METOPROLOL TARTRATE 50 MG PO TABS
50.0000 mg | ORAL_TABLET | Freq: Two times a day (BID) | ORAL | Status: DC
  Administered 2021-08-04 – 2021-08-06 (×4): 50 mg via ORAL
  Filled 2021-08-04 (×4): qty 1

## 2021-08-04 MED ORDER — GUAIFENESIN 100 MG/5ML PO LIQD
5.0000 mL | ORAL | Status: DC | PRN
  Administered 2021-08-04: 5 mL via ORAL
  Filled 2021-08-04: qty 10

## 2021-08-04 MED ORDER — GUAIFENESIN ER 600 MG PO TB12
600.0000 mg | ORAL_TABLET | Freq: Two times a day (BID) | ORAL | Status: DC
  Administered 2021-08-04 – 2021-08-06 (×5): 600 mg via ORAL
  Filled 2021-08-04 (×5): qty 1

## 2021-08-04 NOTE — Progress Notes (Signed)
PHARMACY CONSULT NOTE FOR: ? ?OUTPATIENT  PARENTERAL ANTIBIOTIC THERAPY (OPAT) ? ?Indication: Discitis + intra-abd abscess ?Regimen: Zosyn 13.5g continuous infusion every 24 hours ?End date: 09/14/21 ? ?IV antibiotic discharge orders are pended. ?To discharging provider:  please sign these orders via discharge navigator,  ?Select New Orders & click on the button choice - Manage This Unsigned Work.  ?  ?Thank you for allowing pharmacy to be a part of this patient?s care. ? ?Alycia Rossetti, PharmD, BCPS ?Infectious Diseases Clinical Pharmacist ?08/04/2021 3:09 PM  ? ?**Pharmacist phone directory can now be found on [email or web address] ([email or web address]).  Listed under [place].  ?

## 2021-08-04 NOTE — Progress Notes (Signed)
Physical Therapy Treatment ?Patient Details ?Name: Brooke Beasley ?MRN: [id number] ?DOB: 30-Nov-1930 ?Today's Date: 08/04/2021 ? ? ?History of Present Illness Pt is an 88 y.o. female who presented 07/23/21 with a 2 month hx of increasing low back pain with radiation down her R leg. Per chart, her symptoms began after she underwent rectal prolapse surgery in January of 2023. MRI was concerning for L5-S1 discitis/osteomyelitis. S/p L5-S1 disc aspiration 3/31. PMH: carotid stenosis, CKD, diverticulosis, glaucoma, heart murmur, HTN, HLD, hypothyroidism, pseudogout, osteoporosis ? ?  ?PT Comments  ? ? Patient remains significantly limited with mobility due to pain in hips and back at times.  She seemed to tolerate sitting on BSC longer today and has a special cushion brought in by family, though once she was starting to have pain, could no longer tolerate.  She complains of her medication making her groggy and middy headed.  RN made aware.  PT will continue to follow acutely.  Continue to recommend follow up HHPT at d/c.   ?Recommendations for follow up therapy are one component of a multi-disciplinary discharge planning process, led by the attending physician.  Recommendations may be updated based on patient status, additional functional criteria and insurance authorization. ? ?Follow Up Recommendations ? Home health PT ?  ?  ?Assistance Recommended at Discharge    ?Patient can return home with the following A lot of help with walking and/or transfers;Direct supervision/assist for medications management;Assistance with cooking/housework;A lot of help with bathing/dressing/bathroom;Assist for transportation;Help with stairs or ramp for entrance ?  ?Equipment Recommendations ? None recommended by PT  ?  ?Recommendations for Other Services   ? ? ?  ?Precautions / Restrictions Precautions ?Precautions: Fall ?Precaution Comments: back precautions for comfort  ?  ? ?Mobility ? Bed Mobility ?Overal bed mobility: Needs  Assistance ?Bed Mobility: Rolling, Sidelying to Sit ?  ?Sidelying to sit: Min assist ?  ?  ?Sit to sidelying: Min assist ?General bed mobility comments: assist to lift trunk up to sit, to supine assist for positioning ?  ? ?Transfers ?Overall transfer level: Needs assistance ?Equipment used: Rolling walker (2 wheels) ?Transfers: Sit to/from Stand, Bed to chair/wheelchair/BSC ?Sit to Stand: Min assist ?Stand pivot transfers: Min assist ?  ?  ?  ?  ?General transfer comment: up to RW and steps to turn to sit on BSC, brief soiled with stool.  Stood and stepping back to bed ?  ? ?Ambulation/Gait ?  ?  ?  ?  ?  ?  ?  ?General Gait Details: unable to tolerate ambulation today ? ? ?Stairs ?  ?  ?  ?  ?  ? ? ?Wheelchair Mobility ?  ? ?Modified Rankin (Stroke Patients Only) ?  ? ? ?  ?Balance Overall balance assessment: Needs assistance ?Sitting-balance support: Feet supported ?Sitting balance-Leahy Scale: Fair ?  ?  ?Standing balance support: Single extremity supported, During functional activity ?Standing balance-Leahy Scale: Poor ?Standing balance comment: UE support for balance while PT assisting wtih hygiene ?  ?  ?  ?  ?  ?  ?  ?  ?  ?  ?  ?  ? ?  ?Cognition Arousal/Alertness: Awake/alert, Lethargic ?Behavior During Therapy: [facility] for tasks assessed/performed ?Overall Cognitive Status: Within Functional Limits for tasks assessed ?  ?  ?  ?  ?  ?  ?  ?  ?  ?  ?  ?  ?  ?  ?  ?  ?General Comments: asleep initially, aroused, but groggy initially, then  somewhat improved after mobility ?  ?  ? ?  ?Exercises   ? ?  ?General Comments General comments (skin integrity, edema, etc.): VSS on RA, daughter called during session; Assist for hygiene and placing new brief, purewick and cream ?  ?  ? ?Pertinent Vitals/Pain Pain Assessment ?Pain Assessment: Faces ?Faces Pain Scale: Hurts whole lot ?Pain Location: both hips ?Pain Descriptors / Indicators: Discomfort, Grimacing, Guarding ?Pain Intervention(s): Monitored during session,  Repositioned, Limited activity within patient's tolerance  ? ? ?Home Living   ?  ?  ?  ?  ?  ?  ?  ?  ?  ?   ?  ?Prior Function    ?  ?  ?   ? ?PT Goals (current goals can now be found in the care plan section) Progress towards PT goals: Not progressing toward goals - comment ? ?  ?Frequency ? ? ? Min 3X/week ? ? ? ?  ?PT Plan Current plan remains appropriate  ? ? ?Co-evaluation   ?  ?  ?  ?  ? ?  ?AM-PAC PT "6 Clicks" Mobility   ?Outcome Measure ? Help needed turning from your back to your side while in a flat bed without using bedrails?: A Little ?Help needed moving from lying on your back to sitting on the side of a flat bed without using bedrails?: A Little ?Help needed moving to and from a bed to a chair (including a wheelchair)?: A Little ?Help needed standing up from a chair using your arms (e.g., wheelchair or bedside chair)?: A Little ?Help needed to walk in hospital room?: Total ?Help needed climbing 3-5 steps with a railing? : Total ?6 Click Score: 14 ? ?  ?End of Session Equipment Utilized During Treatment: Gait belt ?Activity Tolerance: Patient limited by pain ?Patient left: in bed;with call bell/phone within reach ?  ?PT Visit Diagnosis: Unsteadiness on feet (R26.81);Other abnormalities of gait and mobility (R26.89);Muscle weakness (generalized) (M62.81);Difficulty in walking, not elsewhere classified (R26.2);Pain ?Pain - Right/Left: Right ?Pain - part of body: Hip ?  ? ? ?Time: 1219-7588 ?PT Time Calculation (min) (ACUTE ONLY): 25 min ? ?Charges:  $Therapeutic Activity: 23-37 mins          ?          ? ?Magda Kiel, PT ?Acute Rehabilitation Services ?TGPQD:[phone] ?[phone] ?08/04/2021 ? ? ? ?Reginia Naas ?08/04/2021, 3:59 PM ? ?

## 2021-08-04 NOTE — Progress Notes (Signed)
? ?[facility] ?PROGRESS NOTE ? ? ?Brooke Beasley ZOX:[id number] DOB: 1931-04-22 DOA: 07/23/2021  11 ?DOS: the patient was seen and examined on 08/04/2021 ? ?PCP: Brooke Bush, MD ? ?Brief History and Hospital Course:  ?88 y.o. female with PMH significant for HTN, HLD, carotid stenosis, CKD 3, recurrent C. difficile infection, anxiety/depression, hiatal hernia, GERD, hypothyroidism, osteoarthritis who had multiple prior admissions for syncope with falls and fractures. ?On 07/13/2021 patient was seen at the PCPs office with 2 months of intermittent discomfort and low back pain worsening over a period of 2 weeks.  With the suspicion of right sciatica, she was given steroid taper and as needed hydrocodone.  She also recently completed 2 weeks course of oral Augmentin. On 07/23/2021, because of unrelenting pain, outpatient MRI was performed which showed phlegmon versus abscess L5-S1 with T1 abnormality. Patient was sent to the ED. she was hospitalized for further management.  Noted to be in new onset atrial fibrillation. ?  ?3/31, follow-up MRI with contrast showed a 4 mm epidural phlegmon without drainable component. ?3/31, underwent disc aspiration that grew Enterococcus.  Following CT scan showed decrease in the size of seroma. ?4/2, started endocarditis coverage with Unasyn ?4/3, TEE negative for endocarditis antibiotics adjusted by ID, PICC line placed by vascular access team ?4/7, MRI lumbar and sacral spine were repeated because of persistent pain and elevated inflammatory markers.  8 showed continued discitis osteomyelitis and L5-S1 level.  It also showed 6 cm size collection posterior to the rectum likely an abscess. ?4/10: CT-guided aspiration of pelvic fluid collection posterior to the rectum. ? ?Consultants: Infectious disease.  Interventional radiology ? ?Procedures: Disc aspiration on 3/31 ? ? ? ?Subjective: ?Complains of a cough this morning occasionally tinged with blood.  Denies any chest  pain.  Continues to have pain in the back and right leg.  No new complaints offered.  Daughter at bedside.   ? ? ?Assessment/Plan: ? ?* Discitis ?Abscess ? ?-Status post IR guided disc aspiration 3/31---fluid culture grew Enterococcus.  TEE negative for endocarditis. ?-Patient continues to have pain and elevated inflammatory markers.  Lumbosacral MRI repeated and it showed persistent discitis, osteomyelitis and L5-S1 level and worsening size of abscess in pelvis posterior to the rectum. ?-IR aspirated this fluid collection on 4/10.  Cultures pending.   ?-Currently only Unasyn --> PICC line placed 4/3. Stop date: 09/03/2021 (6 weeks) ?-Currently getting pain control with fentanyl patch, IV morphine as needed, Norco 08/27/2023 q4 prn, Tylenol 500 mg 4 times daily, gabapentin 400 mg 3 times daily.  ?Was seen by PT who did recommend home health last week.  Seen by OT on 4/10 who recommended home health.  Wait for PT reevaluation. ? ?Right-sided low back pain with right-sided sciatica ?Likely due to discitis.  Physical therapy is ongoing.   ? ?New onset a-fib [facility]) ?-Likely due to infection.  Currently on metoprolol 12.5 mg twice daily ?-CHA2DS2-VASc score=5.   ?Eliquis was started but placed on hold due to need for procedure.  We will resume it tomorrow. ?Echocardiogram shows normal systolic function with mild to moderate aortic valve stenosis. ?TSH noted to be mildly elevated at 6.63.  Free T4 0.77.  Patient already on levothyroxine for hypothyroidism.  No dose changes needed. ?-Outpatient follow-up Dr. Doylene Beasley for aortic valve stenosis as needed ? ? ?Cough ?Complains of cough this morning.  Daughter mentions that cough has been ongoing for a few months but worsening noted in the last few days.  Apparently coughed up some blood  as well.  Hemoglobin is stable. ?Chest x-ray shows unchanged small left pleural effusion with mild left basilar opacity which is likely due to atelectasis. ?Will add Mucinex.  Mobilize.  Flutter  valve.  Continue to monitor. ? ?Stage 3a chronic kidney disease (CKD) ([place]) ?Avoid nephrotoxic agents.  Renal function stable for the most part. ? ?Hypertension, essential ?Stable.  Holding irbesartan Lasix and Aldactone.  Currently not on any antihypertensives except for metoprolol for atrial fibrillation.   ?Dose of metoprolol was increased yesterday for elevated blood pressure readings.  Continue to monitor.   ?Should be able to resume her ARB. ?Allergy noted to amlodipine, ACE inhibitor, carvedilol, losartan. ? ?Hypothyroidism ?Continue levothyroxine.  Mildly elevated TSH noted with normal free T4. ? ?Recurrent Clostridioides difficile diarrhea ?-Patient has history of prior recurrent C. Difficile requiring prolonged prior vancomycin taper plus Zinplava 04/2019 ?-Currently on secondary C. difficile prophylaxis with vancomycin 125 p.o. daily till 5/17 ?Can use Imodium as needed for diarrhea. ? ? ?DVT Prophylaxis: Eliquis on hold ?Code Status: DNR ?Family Communication: No family at bedside ?Disposition Plan: Home health when ready for discharge ? ?Status is: Inpatient ?Remains inpatient appropriate because: Need for procedures, need for IV antibiotics ? ? ? ? ?Medications: Scheduled: ? acetaminophen  500 mg Oral QID  ? brimonidine  1 drop Both Eyes Daily  ? Chlorhexidine Gluconate Cloth  6 each Topical Daily  ? clonazePAM  0.5 mg Oral QHS  ? fentaNYL  1 patch Transdermal Q72H  ? ferrous sulfate  325 mg Oral BID WC  ? gabapentin  400 mg Oral TID  ? latanoprost  1 drop Both Eyes QHS  ? levothyroxine  100 mcg Oral Daily  ? loperamide  2 mg Oral QHS  ? magnesium oxide  800 mg Oral BID  ? metoprolol tartrate  25 mg Oral BID  ? sertraline  100 mg Oral q AM  ? sodium chloride flush  10-40 mL Intracatheter Q12H  ? vancomycin  125 mg Oral QHS  ? ?Continuous: ? ampicillin-sulbactam (UNASYN) IV 3 g (08/04/21 0555)  ? ?BMW:[id number], Gerhardt's butt cream, HYDROcodone-acetaminophen, loperamide, melatonin, morphine injection,  ondansetron **OR** ondansetron (ZOFRAN) IV, sodium chloride flush ? ?Antibiotics: ?Anti-infectives (From admission, onward)  ? ? Start     Dose/Rate Route Frequency Ordered Stop  ? 07/26/21 2200  cefTRIAXone (ROCEPHIN) 2 g in sodium chloride 0.9 % 100 mL IVPB  Status:  Discontinued       ? 2 g ?200 mL/hr over 30 Minutes Intravenous Every 12 hours 07/26/21 1219 07/27/21 1043  ? 07/26/21 1200  Ampicillin-Sulbactam (UNASYN) 3 g in sodium chloride 0.9 % 100 mL IVPB       ? 3 g ?200 mL/hr over 30 Minutes Intravenous Every 6 hours 07/26/21 1056    ? 07/24/21 2200  vancomycin (VANCOCIN) capsule 125 mg       ? 125 mg Oral Daily at bedtime 07/24/21 1609    ? 07/24/21 2100  cefTRIAXone (ROCEPHIN) 2 g in sodium chloride 0.9 % 100 mL IVPB  Status:  Discontinued       ? 2 g ?200 mL/hr over 30 Minutes Intravenous Every 24 hours 07/24/21 1609 07/26/21 1219  ? 07/24/21 2000  DAPTOmycin (CUBICIN) 600 mg in sodium chloride 0.9 % IVPB  Status:  Discontinued       ? 8 mg/kg ? 72.6 kg ?124 mL/hr over 30 Minutes Intravenous Daily 07/24/21 1609 07/26/21 1056  ? 07/24/21 1800  vancomycin (VANCOCIN) 50 mg/mL oral solution SOLN 125 mg  Status:  Discontinued       ? 125 mg Oral Every 6 hours 07/24/21 1607 07/24/21 1609  ? 07/24/21 1700  cefTRIAXone (ROCEPHIN) 1 g in sodium chloride 0.9 % 100 mL IVPB  Status:  Discontinued       ? 1 g ?200 mL/hr over 30 Minutes Intravenous Every 24 hours 07/24/21 1607 07/24/21 1609  ? ?  ? ? ?Objective: ? ?Vital Signs ? ?Vitals:  ? 08/03/21 1936 08/03/21 2318 08/04/21 0304 08/04/21 0758  ?BP: (!) 167/92 (!) 151/91 (!) 156/84 (!) 155/86  ?Pulse: 77 76 82 (!) 110  ?Resp: '16 19 15 20  '$ ?Temp: 98.3 ?F (36.8 ?C) 98.3 ?F (36.8 ?C) 98.2 ?F (36.8 ?C) 98.2 ?F (36.8 ?C)  ?TempSrc: Oral Oral Oral   ?SpO2: 91% 93% 100% 98%  ?Weight:   71.9 kg   ?Height:      ? ? ?Intake/Output Summary (Last 24 hours) at 08/04/2021 1008 ?Last data filed at 08/04/2021 0630 ?Gross per 24 hour  ?Intake 730 ml  ?Output 1675 ml  ?Net -945 ml   ? ? ?Filed Weights  ? 08/02/21 0500 08/03/21 0500 08/04/21 0304  ?Weight: 73.4 kg 73.4 kg 71.9 kg  ? ? ?General appearance: Awake alert.  In no distress ?Resp: Few crackles at the bases but no wheezing.  N

## 2021-08-04 NOTE — Assessment & Plan Note (Addendum)
Chest x-ray showed unchanged left-sided pleural effusion with atelectasis.  Continue incentive spirometry.  Continue Mucinex.  Flutter valve.  Patient reassured.

## 2021-08-04 NOTE — Progress Notes (Signed)
Referring Physician(s): Lorin Glass, MD  Supervising Physician: Irish Lack  Patient Status:  [facility] - In-pt  Chief Complaint:  CT guided aspiration of pelvic abscess   Subjective:  Pt resting in bed. She states that she is still having hip/back pain.   Allergies: Amlodipine, Ace inhibitors, Carvedilol, Losartan, Other, Paroxetine hcl, Risedronate sodium, Simvastatin, Toprol xl [metoprolol], Alendronate sodium, Influenza vaccines, Silenor [doxepin hcl], and Sulfonamide derivatives  Medications: Prior to Admission medications   Medication Sig Start Date End Date Taking? Authorizing Provider  acetaminophen (TYLENOL) 500 MG tablet Take 500-1,000 mg by mouth every 6 (six) hours as needed for moderate pain. Depends on pain   Yes [provider]  albuterol (PROVENTIL HFA;VENTOLIN HFA) 108 (90 Base) MCG/ACT inhaler Inhale 2 puffs into the lungs every 6 (six) hours as needed for wheezing or shortness of breath. 01/12/18  Yes Eustaquio Boyden, MD  ALPHAGAN P 0.1 % SOLN Place 1 drop into both eyes daily. 03/10/21  Yes [provider]  bimatoprost (LUMIGAN) 0.01 % SOLN Place 1 drop into both eyes at bedtime.   Yes [provider]  Bioflavonoid Products (VITAMIN C) CHEW Chew 1 tablet by mouth daily.   Yes [provider]  bisacodyl (DULCOLAX) 5 MG EC tablet Take 1 tablet (5 mg total) by mouth daily as needed for moderate constipation. 01/16/21 01/16/22 Yes Rolly Salter, MD  cholecalciferol (VITAMIN D) 1000 units tablet Take 1,000 Units by mouth daily.   Yes [provider]  clonazePAM (KLONOPIN) 0.5 MG tablet TAKE 1 TABLET BY MOUTH AT BEDTIME Patient taking differently: Take 0.5 mg by mouth at bedtime. 06/30/21  Yes Eustaquio Boyden, MD  colchicine 0.6 MG tablet Take 1 tablet (0.6 mg total) by mouth daily as needed (gout flare). 12/05/20  Yes Eustaquio Boyden, MD  Famotidine-Ca Carb-Mag Hydrox [facility] COMPLETE PO) Take 1 tablet by mouth daily as  needed (indigestion).   Yes [provider]  furosemide (LASIX) 20 MG tablet Take 20 mg by mouth daily as needed for edema or fluid.   Yes [provider]  gabapentin (NEURONTIN) 100 MG capsule Take 2 capsules by mouth 2 times daily. Patient taking differently: Take 200 mg by mouth 3 (three) times daily. 05/29/21  Yes Karie Soda, MD  HYDROcodone-acetaminophen (NORCO/VICODIN) 5-325 MG tablet Take 1 tablet by mouth 3 (three) times daily as needed for moderate pain or severe pain. 07/13/21  Yes Eustaquio Boyden, MD  hydrocortisone 2.5 % cream Place 1 application rectally daily as needed (hemorrhoids). 10/23/18  Yes [provider]  Infant Care Products [facility]) OINT Apply 1 application topically 2 (two) times daily as needed. 06/16/21  Yes Karie Schwalbe, MD  irbesartan (AVAPRO) 150 MG tablet Take 1 tablet (150 mg total) by mouth daily. 03/31/21  Yes Eustaquio Boyden, MD  Iron-FA-B Cmp-C-Biot-Probiotic (FUSION PLUS) CAPS Take 1 capsule by mouth once daily 06/22/21  Yes Vanga, Loel Dubonnet, MD  levothyroxine (SYNTHROID) 100 MCG tablet Take 1 tablet by mouth once daily 04/08/21  Yes Eustaquio Boyden, MD  Misc Natural Products (OSTEO BI-FLEX TRIPLE STRENGTH PO) Take 1 tablet by mouth daily.   Yes [provider]  Multiple Vitamin (MULTIVITAMIN ADULT PO) Take by mouth daily.   Yes [provider]  ondansetron (ZOFRAN) 4 MG tablet Take 1 tablet by mouth every 8 hours as needed for nausea or refractory nausea / vomiting. 05/26/21  Yes Karie Soda, MD  polyethylene glycol (MIRALAX / GLYCOLAX) 17 g packet Take 17 g by mouth  daily.   Yes [provider]  sertraline (ZOLOFT) 100 MG tablet Take 100 mg by mouth in the morning. 10/05/20  Yes Eustaquio Boyden, MD  Simethicone 180 MG CAPS Take 1 capsule by mouth daily as needed (indigestion).   Yes [provider]  spironolactone (ALDACTONE) 25 MG tablet Take 1 tablet (25 mg total) by mouth daily.  03/31/21  Yes Eustaquio Boyden, MD  traMADol (ULTRAM) 50 MG tablet Take 50 mg by mouth every 6 (six) hours as needed for moderate pain.   Yes [provider]  VOLTAREN 1 % GEL APPLY ONE APPLICATION TOPICALLY 3 TIMES DAILY Patient taking differently: Apply 2 g topically 3 (three) times daily as needed (arthritis pain). 09/26/14  Yes Eustaquio Boyden, MD  Wheat Dextrin (BENEFIBER DRINK MIX PO) Take 15 mLs by mouth daily.   Yes [provider]  benzonatate (TESSALON) 100 MG capsule Take 1 capsule (100 mg total) by mouth 3 (three) times daily as needed for cough. Patient not taking: Reported on 07/23/2021 06/03/21   Eustaquio Boyden, MD  denosumab (PROLIA) 60 MG/ML SOSY injection Inject 60 mg into the skin every 6 (six) months. Patient not taking: Reported on 07/23/2021 05/06/20   Eustaquio Boyden, MD  omeprazole (PRILOSEC) 20 MG capsule Take 20 mg by mouth daily as needed (heartburn).    [provider]  predniSONE (DELTASONE) 20 MG tablet Take two tablets daily for 4 days followed by one tablet daily for 4 days Patient not taking: Reported on 07/23/2021 07/13/21   Eustaquio Boyden, MD     Vital Signs: BP (!) 155/86 (BP Location: Left Arm)   Pulse (!) 110   Temp 98.2 F (36.8 C)   Resp 20   Ht 5\' 1"  (1.549 m)   Wt 158 lb 8.2 oz (71.9 kg)   SpO2 98%   BMI 29.95 kg/m   Physical Exam Constitutional:      General: She is not in acute distress.    Appearance: She is not ill-appearing.  HENT:     Head: Normocephalic and atraumatic.  Eyes:     Extraocular Movements: Extraocular movements intact.     Pupils: Pupils are equal, round, and reactive to light.  Pulmonary:     Effort: Pulmonary effort is normal. No respiratory distress.  Skin:    General: Skin is warm and dry.     Comments: Small area of ecchymosis at the puncture site. No bleeding, swelling or edema noted. Bandage clean and dry.   Neurological:     Mental Status: She is alert and oriented to person,  place, and time.  Psychiatric:        Mood and Affect: Mood normal.        Behavior: Behavior normal.        Thought Content: Thought content normal.        Judgment: Judgment normal.    Imaging: DG CHEST PORT 1 VIEW  Result Date: 08/04/2021 CLINICAL DATA:  Cough EXAM: PORTABLE CHEST 1 VIEW COMPARISON:  Chest x-ray dated July 25, 2021 FINDINGS: Right arm PICC tip is positioned over the expected area of the upper SVC, unchanged compared prior exam. Cardiac and mediastinal contours are unchanged. Small left pleural effusion with associated mild left basilar opacities. No evidence of pneumothorax. IMPRESSION: Unchanged small left pleural effusion with associated mild left basilar opacities which are likely due to atelectasis. Electronically Signed   By: Allegra Lai M.D.   On: 08/04/2021 09:47   CT IMAGE GUIDED DRAINAGE BY PERCUTANEOUS  CATHETER  Result Date: 08/03/2021 INDICATION: Concern for pelvic abscess EXAM: CT GUIDED DRAINAGE OF pelvic ABSCESS MEDICATIONS: The patient is currently admitted to the hospital and receiving intravenous antibiotics. The antibiotics were administered within an appropriate time frame prior to the initiation of the procedure. ANESTHESIA/SEDATION: Moderate (conscious) sedation was employed during this procedure. A total of Versed 0.5 mg and Fentanyl 25 mcg was administered intravenously by the radiology nurse. Total intra-service moderate Sedation Time: 10 minutes. The patient's level of consciousness and vital signs were monitored continuously by radiology nursing throughout the procedure under my direct supervision. COMPLICATIONS: None immediate. TECHNIQUE: Informed written consent was obtained from the patient after a thorough discussion of the procedural risks, benefits and alternatives. All questions were addressed. Maximal Sterile Barrier Technique was utilized including caps, mask, sterile gowns, sterile gloves, sterile drape, hand hygiene and skin antiseptic. A  timeout was performed prior to the initiation of the procedure. PROCEDURE: The patient was placed prone on the exam table. Limited CT of the pelvis was performed for planning purposes. This again demonstrated a small organized fluid collection in the presacral space posterior to the rectum in the pelvis. Skin entry site was marked, and the overlying skin was prepped and draped in a standard sterile fashion. Local analgesia was obtained with 1% lidocaine. Using intermittent CT fluoroscopy, a 19 gauge Yueh catheter was advanced towards the target fluid collection in the presacral space. Subsequently, approximately 6 mL of purulent material was aspirated. The fluid collection was too small to attempt drain placement. A sample was sent to the lab for analysis. Needle was removed, and postprocedure imaging demonstrated decompression of the abscess cavity. A clean dressing was placed after manual hemostasis. The patient tolerated the procedure well without immediate complication. FINDINGS: See above. IMPRESSION: Successful CT-guided aspiration of the pelvic abscess in the presacral space with removal of approximately 6 mL of purulent material. The abscess cavity appeared completely decompressed after aspiration, and was too small for drain placement. A sample was sent to the lab for analysis. Electronically Signed   By: Olive Bass M.D.   On: 08/03/2021 12:46    Labs:  CBC: Recent Labs    08/01/21 0219 08/02/21 0513 08/03/21 0336 08/04/21 0221  WBC 9.3 10.2 8.5 8.7  HGB 9.7* 11.0* 10.6* 11.4*  HCT 30.9* 34.7* 32.7* 35.4*  PLT 246 247 259 258    COAGS: Recent Labs    07/24/21 0638  INR 1.1    BMP: Recent Labs    08/01/21 0219 08/02/21 0513 08/03/21 0336 08/04/21 0221  NA 138 138 136 137  K 4.3 4.5 4.8 4.2  CL 104 102 99 102  CO2 26 28 31 28   GLUCOSE 99 101* 120* 105*  BUN 9 8 9 10   CALCIUM 8.8* 9.2 9.2 9.0  CREATININE 0.98 1.07* 1.14* 0.88  GFRNONAA 56* 50* 46* >60    LIVER  FUNCTION TESTS: Recent Labs    07/23/21 1555 07/24/21 0449 07/25/21 0544 07/26/21 0524 07/27/21 0337  BILITOT 0.4 0.6 0.6  --  0.1*  AST 19 22 23   --  17  ALT 17 18 17   --  13  ALKPHOS 89 92 89  --  77  PROT 7.0 6.5 6.6  --  5.5*  ALBUMIN 3.6 2.9* 2.9* 2.5* 2.3*    Assessment and Plan:  CT guided aspiration of pelvic abscess yielding 6 ml purulent material 08/03/21 with Dr. Juliette Alcide.   Pt resting in bed. She states that she is still having hip/back  pain.  She denies pain at the puncture site.  Labs WNL.  VSS, afebrile.  Culture result pending.   TRH/ID following.  Call IR with questions or concerns.   Electronically Signed: Shon Hough, NP 08/04/2021, 1:49 PM   I spent a total of 15 Minutes at the the patient's bedside AND on the patient's hospital floor or unit, greater than 50% of which was counseling/coordinating care for aspiration of pelvic abscess.

## 2021-08-04 NOTE — Progress Notes (Signed)
?   ? ?[facility] ? ?Date of Admission:  07/23/2021    ?       ?Reason for visit: Follow up on discitis/osteomyelitis ?  ?Current antibiotics: ?Ampicillin sulbactam 4/2--present ?Vancomycin p.o. 3/31-present ?  ?Previous antibiotics: ?Daptomycin 3/31-4/2 ?Ceftriaxone 3/31-4/3 ? ? ?ASSESSMENT:   ? ?88 y.o. female admitted with: ? ?Discitis/osteomyelitis L5-S1 secondary to Enterococcus faecalis: Spinal infection initially noted on MRI without contrast 3/30 following several weeks of worsening back pain.  MRI with contrast on 3/31 also noted an additional 4 mm epidural phlegmon without drainable component.  Status post IR disc aspiration 3/31 with cultures yielding Enterococcus faecalis.  Repeat MRI 4/7 due to worsening inflammatory markers and unrelenting back pain significant for expected ongoing discitis/osteomyelitis as well as anterior epidural phlegmon measuring up to 6 mm with a developing small abscess measuring 1.2 x 0.4 cm. ?Intra-abdominal fluid collection: This was initially noted on imaging as an outpatient in early March by her surgeon.  She incidentally also received approximately 2 weeks of Augmentin from her PCP related to a potential respiratory infection from 3/1 through 3/14.  She had a follow-up CT scan on 3/31 which then noted improving fluid collections, however, MRI on 4/7 again noted a rim-enhancing collection posterior to the rectum measuring 6.2 x 2.4 cm consistent with seroma or abscess.  Status post IR CT-guided aspiration on 4/10 yielding 6 mL of purulent fluid.  Cultures pending.  Collection was noted to be too small for drain placement but abscess cavity was felt to be decompressed following aspiration. ?History of recurrent C. difficile: Currently on oral vancomycin for prophylaxis. ?CKD stage III: Creatinine is improved today. ?Recent rectal prolapse surgery: Completed on 1/31. ?Cough: New complaint this morning with chest x-ray showing unchanged small  pleural fluid and likely atelectasis. ? ?RECOMMENDATIONS:   ? ?Continue Unasyn ?Follow abscess cultures from yesterday's IR guided aspiration ?Continue vancomycin prophylaxis ?Agree with supportive care for cough ?Continue working with therapy ?Will follow ? ? ?Principal Problem: ?  Discitis ?Active Problems: ?  Hypothyroidism ?  Hypertension, essential ?  Valvular heart disease ?  Stage 3a chronic kidney disease (CKD) ([place]) ?  Recurrent Clostridioides difficile diarrhea ?  Right-sided low back pain with right-sided sciatica ?  New onset a-fib [facility]) ?  Abscess ? ? ? ?MEDICATIONS:   ? ?Scheduled Meds: ? acetaminophen  500 mg Oral QID  ? brimonidine  1 drop Both Eyes Daily  ? Chlorhexidine Gluconate Cloth  6 each Topical Daily  ? clonazePAM  0.5 mg Oral QHS  ? fentaNYL  1 patch Transdermal Q72H  ? ferrous sulfate  325 mg Oral BID WC  ? gabapentin  400 mg Oral TID  ? latanoprost  1 drop Both Eyes QHS  ? levothyroxine  100 mcg Oral Daily  ? loperamide  2 mg Oral QHS  ? magnesium oxide  800 mg Oral BID  ? metoprolol tartrate  25 mg Oral BID  ? sertraline  100 mg Oral q AM  ? sodium chloride flush  10-40 mL Intracatheter Q12H  ? vancomycin  125 mg Oral QHS  ? ?Continuous Infusions: ? ampicillin-sulbactam (UNASYN) IV 3 g (08/04/21 0555)  ? ?PRN Meds:.albuterol, Gerhardt's butt cream, HYDROcodone-acetaminophen, loperamide, melatonin, morphine injection, ondansetron **OR** ondansetron (ZOFRAN) IV, sodium chloride flush ? ?SUBJECTIVE:  ? ?24 hour events:  ?Status post CT-guided aspiration of pelvic fluid collection yielding 6 mL of purulent material.  Collection was too small for drain placement however abscess cavity appeared  decompressed on imaging following aspiration ?Afebrile, Tmax 98.4 ?Aspiration Gram stain was negative and cultures are pending ?Labs this morning are stable ?Creatinine is improved ?No other new imaging ?No other acute events have been noted at this time ? ? ?She is alone in her room this morning.  She  reports a new dry cough today with a little bit of mucus production.  No fevers or chills.  Chest x-ray was performed which showed likely atelectasis.  She reports that she has not been out of bed to determine if her back pain is better or worse today following abscess aspiration. ? ?Review of Systems  ?All other systems reviewed and are negative. ? ?  ?OBJECTIVE:  ? ?Blood pressure (!) 155/86, pulse (!) 110, temperature 98.2 ?F (36.8 ?C), resp. rate 20, height '5\' 1"'$  (1.549 m), weight 71.9 kg, SpO2 98 %. ?Body mass index is 29.95 kg/m?. ? ?Physical Exam ?Constitutional:   ?   General: She is not in acute distress. ?   Appearance: Normal appearance.  ?HENT:  ?   Head: Normocephalic and atraumatic.  ?Eyes:  ?   Extraocular Movements: Extraocular movements intact.  ?   Conjunctiva/sclera: Conjunctivae normal.  ?Pulmonary:  ?   Effort: Pulmonary effort is normal. No respiratory distress.  ?   Comments: On 2 L via [place] at 100% SpO2 ?Abdominal:  ?   General: There is no distension.  ?   Palpations: Abdomen is soft.  ?   Tenderness: There is no abdominal tenderness.  ?Musculoskeletal:     ?   General: Normal range of motion.  ?   Right lower leg: No edema.  ?   Left lower leg: No edema.  ?Skin: ?   General: Skin is warm and dry.  ?Neurological:  ?   General: No focal deficit present.  ?   Mental Status: She is alert and oriented to person, place, and time.  ?Psychiatric:     ?   Mood and Affect: Mood normal.     ?   Behavior: Behavior normal.  ? ? ? ?Lab Results: ?Lab Results  ?Component Value Date  ? WBC 8.7 08/04/2021  ? HGB 11.4 (L) 08/04/2021  ? HCT 35.4 (L) 08/04/2021  ? MCV 90.1 08/04/2021  ? PLT 258 08/04/2021  ?  ?Lab Results  ?Component Value Date  ? NA 137 08/04/2021  ? K 4.2 08/04/2021  ? CO2 28 08/04/2021  ? GLUCOSE 105 (H) 08/04/2021  ? BUN 10 08/04/2021  ? CREATININE 0.88 08/04/2021  ? CALCIUM 9.0 08/04/2021  ? GFRNONAA >60 08/04/2021  ? GFRAA 58 (L) 03/28/2019  ?  ?Lab Results  ?Component Value Date  ? ALT 13  07/27/2021  ? AST 17 07/27/2021  ? ALKPHOS 77 07/27/2021  ? BILITOT 0.1 (L) 07/27/2021  ? ? ?   ?Component Value Date/Time  ? CRP 9.5 (H) 07/30/2021 1046  ? ? ?   ?Component Value Date/Time  ? ESRSEDRATE 81 (H) 07/30/2021 1046  ? ?  ?I have reviewed the micro and lab results in Epic. ? ?Imaging: ?CT IMAGE GUIDED DRAINAGE BY PERCUTANEOUS CATHETER ? ?Result Date: 08/03/2021 ?INDICATION: Concern for pelvic abscess EXAM: CT GUIDED DRAINAGE OF pelvic ABSCESS MEDICATIONS: The patient is currently admitted to the hospital and receiving intravenous antibiotics. The antibiotics were administered within an appropriate time frame prior to the initiation of the procedure. ANESTHESIA/SEDATION: Moderate (conscious) sedation was employed during this procedure. A total of Versed 0.5 mg and Fentanyl 25 mcg was administered  intravenously by the radiology nurse. Total intra-service moderate Sedation Time: 10 minutes. The patient's level of consciousness and vital signs were monitored continuously by radiology nursing throughout the procedure under my direct supervision. COMPLICATIONS: None immediate. TECHNIQUE: Informed written consent was obtained from the patient after a thorough discussion of the procedural risks, benefits and alternatives. All questions were addressed. Maximal Sterile Barrier Technique was utilized including caps, mask, sterile gowns, sterile gloves, sterile drape, hand hygiene and skin antiseptic. A timeout was performed prior to the initiation of the procedure. PROCEDURE: The patient was placed prone on the exam table. Limited CT of the pelvis was performed for planning purposes. This again demonstrated a small organized fluid collection in the presacral space posterior to the rectum in the pelvis. Skin entry site was marked, and the overlying skin was prepped and draped in a standard sterile fashion. Local analgesia was obtained with 1% lidocaine. Using intermittent CT fluoroscopy, a 19 gauge Yueh catheter was  advanced towards the target fluid collection in the presacral space. Subsequently, approximately 6 mL of purulent material was aspirated. The fluid collection was too small to attempt drain placement. A sam

## 2021-08-05 DIAGNOSIS — M4646 Discitis, unspecified, lumbar region: Secondary | ICD-10-CM | POA: Diagnosis not present

## 2021-08-05 DIAGNOSIS — L0291 Cutaneous abscess, unspecified: Secondary | ICD-10-CM | POA: Diagnosis not present

## 2021-08-05 DIAGNOSIS — R052 Subacute cough: Secondary | ICD-10-CM | POA: Diagnosis not present

## 2021-08-05 DIAGNOSIS — I4891 Unspecified atrial fibrillation: Secondary | ICD-10-CM | POA: Diagnosis not present

## 2021-08-05 LAB — CBC
HCT: 31.4 % — ABNORMAL LOW (ref 36.0–46.0)
Hemoglobin: 9.8 g/dL — ABNORMAL LOW (ref 12.0–15.0)
MCH: 28.5 pg (ref 26.0–34.0)
MCHC: 31.2 g/dL (ref 30.0–36.0)
MCV: 91.3 fL (ref 80.0–100.0)
Platelets: 220 10*3/uL (ref 150–400)
RBC: 3.44 MIL/uL — ABNORMAL LOW (ref 3.87–5.11)
RDW: 17.3 % — ABNORMAL HIGH (ref 11.5–15.5)
WBC: 8.3 10*3/uL (ref 4.0–10.5)
nRBC: 0 % (ref 0.0–0.2)

## 2021-08-05 LAB — BASIC METABOLIC PANEL
Anion gap: 5 (ref 5–15)
BUN: 9 mg/dL (ref 8–23)
CO2: 30 mmol/L (ref 22–32)
Calcium: 8.8 mg/dL — ABNORMAL LOW (ref 8.9–10.3)
Chloride: 99 mmol/L (ref 98–111)
Creatinine, Ser: 0.97 mg/dL (ref 0.44–1.00)
GFR, Estimated: 56 mL/min — ABNORMAL LOW (ref >60)
Glucose, Bld: 102 mg/dL — ABNORMAL HIGH (ref 70–99)
Potassium: 4.4 mmol/L (ref 3.5–5.1)
Sodium: 134 mmol/L — ABNORMAL LOW (ref 135–145)

## 2021-08-05 MED ORDER — APIXABAN 5 MG PO TABS
5.0000 mg | ORAL_TABLET | Freq: Two times a day (BID) | ORAL | Status: DC
Start: 2021-08-05 — End: 2021-08-06
  Administered 2021-08-05 – 2021-08-06 (×3): 5 mg via ORAL
  Filled 2021-08-05 (×3): qty 1

## 2021-08-05 MED ORDER — PIPERACILLIN-TAZOBACTAM 3.375 G IVPB
3.3750 g | Freq: Three times a day (TID) | INTRAVENOUS | Status: DC
  Administered 2021-08-05 – 2021-08-06 (×3): 3.375 g via INTRAVENOUS
  Filled 2021-08-05 (×3): qty 50

## 2021-08-05 MED ORDER — HYDROCODONE-ACETAMINOPHEN 5-325 MG PO TABS
1.0000 | ORAL_TABLET | Freq: Three times a day (TID) | ORAL | Status: DC
Start: 2021-08-05 — End: 2021-08-06
  Administered 2021-08-05 – 2021-08-06 (×4): 1 via ORAL
  Filled 2021-08-05 (×4): qty 1

## 2021-08-05 MED ORDER — GABAPENTIN 400 MG PO CAPS: 400.0000 mg | ORAL_CAPSULE | Freq: Three times a day (TID) | ORAL | 1 refills | Status: DC

## 2021-08-05 NOTE — Progress Notes (Signed)
? ?[facility] ?PROGRESS NOTE ? ? ?Brooke Beasley HCW:[id number] DOB: [birth year] DOA: 07/23/2021  12 ?DOS: the patient was seen and examined on 08/05/2021 ? ?PCP: [name], MD ? ?Brief History and Hospital Course:  ?88 y.o. female with PMH significant for HTN, HLD, carotid stenosis, CKD 3, recurrent C. difficile infection, anxiety/depression, hiatal hernia, GERD, hypothyroidism, osteoarthritis who had multiple prior admissions for syncope with falls and fractures. ?On 07/13/2021 patient was seen at the PCPs office with 2 months of intermittent discomfort and low back pain worsening over a period of 2 weeks.  With the suspicion of right sciatica, she was given steroid taper and as needed hydrocodone.  She also recently completed 2 weeks course of oral Augmentin. On 07/23/2021, because of unrelenting pain, outpatient MRI was performed which showed phlegmon versus abscess L5-S1 with T1 abnormality. Patient was sent to the ED. she was hospitalized for further management.  Noted to be in new onset atrial fibrillation. ?  ?3/31, follow-up MRI with contrast showed a 4 mm epidural phlegmon without drainable component. ?3/31, underwent disc aspiration that grew Enterococcus.  Following CT scan showed decrease in the size of seroma. ?4/2, started endocarditis coverage with Unasyn ?4/3, TEE negative for endocarditis antibiotics adjusted by ID, PICC line placed by vascular access team ?4/7, MRI lumbar and sacral spine were repeated because of persistent pain and elevated inflammatory markers.  8 showed continued discitis osteomyelitis and L5-S1 level.  It also showed 6 cm size collection posterior to the rectum likely an abscess. ?4/10: CT-guided aspiration of pelvic fluid collection posterior to the rectum. ? ? ?Consultants: Infectious disease.  Interventional radiology ? ? ? ?Subjective: ?Concerned that her pain is not improving.  Daughter is at the bedside.  Questions were answered.    ? ? ?Assessment/Plan: ? ?* Discitis ?Abscess ? ?-Status post IR guided disc aspiration 3/31---fluid culture grew Enterococcus.  TEE negative for endocarditis. ?-Patient continues to have pain and elevated inflammatory markers.  Lumbosacral MRI repeated and it showed persistent discitis, osteomyelitis and L5-S1 level and worsening size of abscess in pelvis posterior to the rectum. ?-IR aspirated this fluid collection on 4/10.  Cultures pending.   ?-Currently only Unasyn --> PICC line placed 4/3. Stop date: 09/03/2021 (6 weeks). ?ID continues to follow. ?PT and OT recommending home health.  Discussed with daughter who is agreeable. ?Pain remains poorly controlled.  Patient mentions that the fentanyl patch does not seem to be doing anything and it is making her woozy.  We will discontinue the fentanyl patch.  Placed on Vicodin 3 times a day on a scheduled basis for now.  Continue other as needed medications. ? ?Right-sided low back pain with right-sided sciatica ?Likely due to discitis.  Physical therapy is ongoing.   ?Changes made to pain medications as discussed above. ? ?New onset a-fib [facility]) ?-Likely due to infection.  Currently on metoprolol 12.5 mg twice daily ?-CHA2DS2-VASc score=5.   ?Eliquis was started but placed on hold due to need for procedure.  Will resume today. ?Echocardiogram shows normal systolic function with mild to moderate aortic valve stenosis. ?TSH noted to be mildly elevated at 6.63.  Free T4 0.77.  Patient already on levothyroxine for hypothyroidism.  No dose changes needed. ?-Outpatient follow-up Dr. [name] for aortic valve stenosis as needed ? ? ?Cough ?Complain of a cough yesterday.  Chest x-ray showed unchanged left-sided pleural effusion with atelectasis.  Continue incentive spirometry.  Continue Mucinex.  Flutter valve.  Patient reassured.  Try to wean her off  of oxygen.   ? ?Stage 3a chronic kidney disease (CKD) ([place]) ?Avoid nephrotoxic agents.  Renal function stable for the most  part. ? ?Hypertension, essential ?Stable.  Holding irbesartan Lasix and Aldactone.   ?Remains on metoprolol.  Elevated blood pressure readings likely from pain. ?Allergy noted to amlodipine, ACE inhibitor, carvedilol, losartan. ? ?Hypothyroidism ?Continue levothyroxine.  Mildly elevated TSH noted with normal free T4. ? ?Recurrent Clostridioides difficile diarrhea ?-Patient has history of prior recurrent C. Difficile requiring prolonged prior vancomycin taper plus Zinplava 04/2019 ?-Currently on secondary C. difficile prophylaxis with vancomycin 125 p.o. daily till 5/17 ?Can use Imodium as needed for diarrhea. ? ? ?DVT Prophylaxis: Resume Eliquis today ?Code Status: DNR ?Family Communication: Discussed with patient and her daughter ?Disposition Plan: Wait for final ID recommendations.  Anticipate discharge tomorrow. ? ?Status is: Inpatient ?Remains inpatient appropriate because: Need for procedures, need for IV antibiotics ? ? ? ? ?Medications: Scheduled: ? acetaminophen  500 mg Oral QID  ? brimonidine  1 drop Both Eyes Daily  ? Chlorhexidine Gluconate Cloth  6 each Topical Daily  ? clonazePAM  0.5 mg Oral QHS  ? ferrous sulfate  325 mg Oral BID WC  ? gabapentin  400 mg Oral TID  ? guaiFENesin  600 mg Oral BID  ? HYDROcodone-acetaminophen  1 tablet Oral TID  ? latanoprost  1 drop Both Eyes QHS  ? levothyroxine  100 mcg Oral Daily  ? loperamide  2 mg Oral QHS  ? magnesium oxide  800 mg Oral BID  ? metoprolol tartrate  50 mg Oral BID  ? sertraline  100 mg Oral q AM  ? sodium chloride flush  10-40 mL Intracatheter Q12H  ? vancomycin  125 mg Oral QHS  ? ?Continuous: ? ampicillin-sulbactam (UNASYN) IV 3 g (08/05/21 0536)  ? ?FTD:DUKGURKYH, Gerhardt's butt cream, guaiFENesin, HYDROcodone-acetaminophen, loperamide, melatonin, ondansetron **OR** ondansetron (ZOFRAN) IV, sodium chloride flush ? ?Antibiotics: ?Anti-infectives (From admission, onward)  ? ? Start     Dose/Rate Route Frequency Ordered Stop  ? 07/26/21 2200   cefTRIAXone (ROCEPHIN) 2 g in sodium chloride 0.9 % 100 mL IVPB  Status:  Discontinued       ? 2 g ?200 mL/hr over 30 Minutes Intravenous Every 12 hours 07/26/21 1219 07/27/21 1043  ? 07/26/21 1200  Ampicillin-Sulbactam (UNASYN) 3 g in sodium chloride 0.9 % 100 mL IVPB       ? 3 g ?200 mL/hr over 30 Minutes Intravenous Every 6 hours 07/26/21 1056    ? 07/24/21 2200  vancomycin (VANCOCIN) capsule 125 mg       ? 125 mg Oral Daily at bedtime 07/24/21 1609    ? 07/24/21 2100  cefTRIAXone (ROCEPHIN) 2 g in sodium chloride 0.9 % 100 mL IVPB  Status:  Discontinued       ? 2 g ?200 mL/hr over 30 Minutes Intravenous Every 24 hours 07/24/21 1609 07/26/21 1219  ? 07/24/21 2000  DAPTOmycin (CUBICIN) 600 mg in sodium chloride 0.9 % IVPB  Status:  Discontinued       ? 8 mg/kg ? 72.6 kg ?124 mL/hr over 30 Minutes Intravenous Daily 07/24/21 1609 07/26/21 1056  ? 07/24/21 1800  vancomycin (VANCOCIN) 50 mg/mL oral solution SOLN 125 mg  Status:  Discontinued       ? 125 mg Oral Every 6 hours 07/24/21 1607 07/24/21 1609  ? 07/24/21 1700  cefTRIAXone (ROCEPHIN) 1 g in sodium chloride 0.9 % 100 mL IVPB  Status:  Discontinued       ?  1 g ?200 mL/hr over 30 Minutes Intravenous Every 24 hours 07/24/21 1607 07/24/21 1609  ? ?  ? ? ?Objective: ? ?Vital Signs ? ?Vitals:  ? 08/04/21 2148 08/04/21 2316 08/05/21 0317 08/05/21 0743  ?BP: (!) 152/87 111/71 (!) 111/55 (!) 157/75  ?Pulse: 75 65 60 71  ?Resp:  '16 14 13  '$ ?Temp:  97.9 ?F (36.6 ?C) 97.6 ?F (36.4 ?C) 98.1 ?F (36.7 ?C)  ?TempSrc:  Oral Oral Oral  ?SpO2:  98% 99% 100%  ?Weight:   72.4 kg   ?Height:      ? ? ?Intake/Output Summary (Last 24 hours) at 08/05/2021 1033 ?Last data filed at 08/05/2021 [phone] ?Gross per 24 hour  ?Intake 250 ml  ?Output 801 ml  ?Net -551 ml  ? ? ?Filed Weights  ? 08/03/21 0500 08/04/21 0304 08/05/21 0317  ?Weight: 73.4 kg 71.9 kg 72.4 kg  ? ? ?General appearance: Awake alert.  In no distress ?Resp: Diminished air entry at the bases.  Few crackles.  No wheezing or  rhonchi. ?Cardio: S1-S2 is irregularly irregular ?GI: Abdomen is soft.  Nontender nondistended.  Bowel sounds are present normal.  No masses organomegaly ?Extremities: No edema.  Decreased range of motion of the right leg but better than b

## 2021-08-05 NOTE — Progress Notes (Signed)
Physical Therapy Treatment ?Patient Details ?Name: Brooke Beasley ?MRN: [id number] ?DOB: 03/04/1931 ?Today's Date: 08/05/2021 ? ? ?History of Present Illness Pt is an 88 y.o. female who presented 07/23/21 with a 2 month hx of increasing low back pain with radiation down her R leg. Per chart, her symptoms began after she underwent rectal prolapse surgery in January of 2023. MRI was concerning for L5-S1 discitis/osteomyelitis. S/p L5-S1 disc aspiration 3/31. PMH: carotid stenosis, CKD, diverticulosis, glaucoma, heart murmur, HTN, HLD, hypothyroidism, pseudogout, osteoporosis ? ?  ?PT Comments  ? ? Progressing with mobility today able to walk 2x to window and back prior to needing to sit due to pain.  She reports potential d/c tomorrow so discussed possible need for wheelchair at home.  Feel safe for home with assist and HHPT.  Will continue to follow acutely.  ?   ?Recommendations for follow up therapy are one component of a multi-disciplinary discharge planning process, led by the attending physician.  Recommendations may be updated based on patient status, additional functional criteria and insurance authorization. ? ?Follow Up Recommendations ? Home health PT ?  ?  ?Assistance Recommended at Discharge Frequent or constant Supervision/Assistance  ?Patient can return home with the following A lot of help with walking and/or transfers;Direct supervision/assist for medications management;Assistance with cooking/housework;A lot of help with bathing/dressing/bathroom;Assist for transportation;Help with stairs or ramp for entrance ?  ?Equipment Recommendations ? Wheelchair (measurements PT);Wheelchair cushion (measurements PT)  ?  ?Recommendations for Other Services   ? ? ?  ?Precautions / Restrictions Precautions ?Precautions: Fall ?Precaution Comments: back precautions for comfort  ?  ? ?Mobility ? Bed Mobility ?Overal bed mobility: Needs Assistance ?Bed Mobility: Rolling, Sidelying to Sit, Sit to  Sidelying ?Rolling: Supervision ?Sidelying to sit: Min assist ?  ?  ?Sit to sidelying: Supervision ?General bed mobility comments: HOB slightly elevated; assist to lift trunk ?  ? ?Transfers ?Overall transfer level: Needs assistance ?Equipment used: Rolling walker (2 wheels) ?Transfers: Sit to/from Stand ?Sit to Stand: Min guard ?  ?  ?  ?  ?  ?General transfer comment: assist for balance ?  ? ?Ambulation/Gait ?Ambulation/Gait assistance: Min assist ?Gait Distance (Feet): 22 Feet ?Assistive device: Rolling walker (2 wheels) ?Gait Pattern/deviations: Step-to pattern, Step-through pattern ?  ?  ?  ?General Gait Details: ambulated twice to door and back ? ? ?Stairs ?  ?  ?  ?  ?  ? ? ?Wheelchair Mobility ?  ? ?Modified Rankin (Stroke Patients Only) ?  ? ? ?  ?Balance Overall balance assessment: Needs assistance ?Sitting-balance support: Feet supported ?Sitting balance-Leahy Scale: Fair ?  ?  ?Standing balance support: Bilateral upper extremity supported ?Standing balance-Leahy Scale: Poor ?Standing balance comment: UE support for balance ?  ?  ?  ?  ?  ?  ?  ?  ?  ?  ?  ?  ? ?  ?Cognition Arousal/Alertness: Awake/alert ?Behavior During Therapy: [facility] for tasks assessed/performed ?Overall Cognitive Status: Within Functional Limits for tasks assessed ?  ?  ?  ?  ?  ?  ?  ?  ?  ?  ?  ?  ?  ?  ?  ?  ?  ?  ?  ? ?  ?Exercises   ? ?  ?General Comments General comments (skin integrity, edema, etc.): on RA throughout; spoke by phone with daughter regarding possibly needing wheelchair for home ?  ?  ? ?Pertinent Vitals/Pain Pain Assessment ?Pain Assessment: Faces ?Faces Pain Scale: Hurts even  more ?Pain Location: RLE, back, hips ?Pain Descriptors / Indicators: Discomfort, Grimacing, Guarding ?Pain Intervention(s): Monitored during session, Limited activity within patient's tolerance, Repositioned  ? ? ?Home Living   ?  ?  ?  ?  ?  ?  ?  ?  ?  ?   ?  ?Prior Function    ?  ?  ?   ? ?PT Goals (current goals can now be found in the  care plan section) Progress towards PT goals: Progressing toward goals ? ?  ?Frequency ? ? ? Min 3X/week ? ? ? ?  ?PT Plan Current plan remains appropriate  ? ? ?Co-evaluation   ?  ?  ?  ?  ? ?  ?AM-PAC PT "6 Clicks" Mobility   ?Outcome Measure ? Help needed turning from your back to your side while in a flat bed without using bedrails?: A Little ?Help needed moving from lying on your back to sitting on the side of a flat bed without using bedrails?: A Little ?Help needed moving to and from a bed to a chair (including a wheelchair)?: A Little ?Help needed standing up from a chair using your arms (e.g., wheelchair or bedside chair)?: A Little ?Help needed to walk in hospital room?: A Little ?Help needed climbing 3-5 steps with a railing? : Total ?6 Click Score: 16 ? ?  ?End of Session Equipment Utilized During Treatment: Gait belt ?Activity Tolerance: Patient limited by pain ?Patient left: in bed;with call bell/phone within reach ?  ?PT Visit Diagnosis: Unsteadiness on feet (R26.81);Other abnormalities of gait and mobility (R26.89);Muscle weakness (generalized) (M62.81);Difficulty in walking, not elsewhere classified (R26.2);Pain ?Pain - Right/Left: Right ?Pain - part of body: Hip ?  ? ? ?Time: 1350-1420 ?PT Time Calculation (min) (ACUTE ONLY): 30 min ? ?Charges:  $Gait Training: 8-22 mins ?$Therapeutic Activity: 8-22 mins          ?          ? ?Magda Kiel, PT ?Acute Rehabilitation Services ?EVOJJ:[phone] ?Office:[phone] ?08/05/2021 ? ? ? ?Reginia Naas ?08/05/2021, 2:38 PM ? ?

## 2021-08-05 NOTE — Progress Notes (Signed)
Patient suffers from diskitis which impairs their ability to perform daily activities like bathing, dressing, feeding, grooming, and toileting in the home.  A cane, crutch, or walker will not resolve  ?issue with performing activities of daily living. A wheelchair will allow patient to safely perform daily activities. Patient is not able to propel themselves in the home using a standard weight wheelchair due to general weakness. Patient can self propel in the lightweight wheelchair. Length of need 12 months . ?Accessories: elevating leg rests (ELRs), wheel locks, extensions and anti-tippers. ?

## 2021-08-05 NOTE — Progress Notes (Signed)
Fentanyl patch removed and disposed of in sharps container with Herby Abraham, RN.  ? ?Justice Rocher, RN ? ?

## 2021-08-05 NOTE — TOC Progression Note (Signed)
Transition of Care (TOC) - Progression Note  ? ? ?Patient Details  ?Name: Brooke Beasley ?MRN: [id number] ?Date of Birth: [birth year] ? ?Transition of Care (TOC) CM/SW Contact  ?Angelita Ingles, RN ?Phone Number:[phone] ? ?08/05/2021, 3:02 PM ? ?Clinical Narrative:    ?Patient to discharge home on IV antibiotics [facility] following for home infusion. [place]  referral has been accepted by [facility]. DME wheelchair has been ordered per [employer] and will be delivered to the bedside.  ? ? ?  ?  ? ?Expected Discharge Plan and Services ?  ?  ?  ?  ?  ?                ?DME Arranged: Wheelchair manual ?DME Agency: [employer] ?Date DME Agency Contacted: 08/05/21 ?Time DME Agency Contacted: 1501 ?Representative spoke with at DME Agency: Maudie Mercury ?HH Arranged: RN, PT, OT ?[place] Agency: [place] ?Date HH Agency Contacted: 08/05/21 ?Time [place]: 1501 ?Representative spoke with at [place]: Tommi Rumps ? ? ?Social Determinants of Health (SDOH) Interventions ?  ? ?Readmission Risk Interventions ? ?  05/29/2021  ? 11:32 AM  ?Readmission Risk Prevention Plan  ?Transportation Screening Complete  ?PCP or Specialist Appt within 3-5 Days Complete  ?[place] or Home Care Consult Complete  ?Palliative Care Screening Not Applicable  ? ? ?

## 2021-08-05 NOTE — Progress Notes (Signed)
Occupational Therapy Treatment ?Patient Details ?Name: Brooke Beasley ?MRN: [id number] ?DOB: 08-01-1930 ?Today's Date: 08/05/2021 ? ? ?History of present illness Pt is an 88 y.o. female who presented 07/23/21 with a 2 month hx of increasing low back pain with radiation down her R leg. Per chart, her symptoms began after she underwent rectal prolapse surgery in January of 2023. MRI was concerning for L5-S1 discitis/osteomyelitis. S/p L5-S1 disc aspiration 3/31. PMH: carotid stenosis, CKD, diverticulosis, glaucoma, heart murmur, HTN, HLD, hypothyroidism, pseudogout, osteoporosis ?  ?OT comments ? Reba is progressing incrementally but continues to have limited activity tolerance due to pain. She did not required physical assist for bed mobility, transfers or 3x bouts of short ambulation with RW this date. Due to pain and coughing spells, she required 2-3 minute sitting rest breaks between each attempt of increasing activity tolerance. OT to continue to follow acutely. D/c to home with family remains appropriate.  ? ?Recommendations for follow up therapy are one component of a multi-disciplinary discharge planning process, led by the attending physician.  Recommendations may be updated based on patient status, additional functional criteria and insurance authorization. ?   ?Follow Up Recommendations ? Home health OT  ?  ?   ?Patient can return home with the following ? A little help with walking and/or transfers;A little help with bathing/dressing/bathroom;Assistance with cooking/housework;Direct supervision/assist for medications management;Direct supervision/assist for financial management;Assist for transportation;Help with stairs or ramp for entrance ?  ?Equipment Recommendations ? None recommended by OT  ?  ?   ?Precautions / Restrictions Precautions ?Precautions: Fall ?Precaution Comments: back precautions for comfort ?Restrictions ?Weight Bearing Restrictions: No  ? ? ?  ? ?Mobility Bed Mobility ?Overal bed  mobility: Needs Assistance ?Bed Mobility: Sidelying to Sit, Rolling, Sit to Sidelying ?Rolling: Supervision ?Sidelying to sit: Supervision ?  ?  ?Sit to sidelying: Supervision ?General bed mobility comments: HOB slightly elevated ?  ? ?Transfers ?Overall transfer level: Needs assistance ?Equipment used: Rolling walker (2 wheels) ?Transfers: Sit to/from Stand ?Sit to Stand: Min guard ?  ?  ?  ?  ?  ?General transfer comment: no physical assist; completed 3x short ambulation of 57f with sitting rest break between each ?  ?  ?Balance Overall balance assessment: Needs assistance ?Sitting-balance support: Feet supported ?Sitting balance-Leahy Scale: Fair ?Sitting balance - Comments: prefers to lean to L in sitting to avoid pain R sciatic distribution ?  ?Standing balance support: Single extremity supported, During functional activity ?Standing balance-Leahy Scale: Poor ?Standing balance comment: benefits from BUE support for pain management ?  ?  ?  ?   ? ?ADL either performed or assessed with clinical judgement  ? ?ADL Overall ADL's : Needs assistance/impaired ?  ?  ?Grooming: Set up;Sitting ?Grooming Details (indicate cue type and reason): in L lateral lean for pain management ?  ?  ?  ?  ?  ?  ?  ?  ?Toilet Transfer: Min guard;Rolling walker (2 wheels);BSC/3in1 ?Toilet Transfer Details (indicate cue type and reason): no physical assist required; limited ambulation due to pain ?  ?  ?  ?  ?Functional mobility during ADLs: Min guard;Rolling walker (2 wheels) ?General ADL Comments: multiple sitting rest breaks for pain management; no phyiscal assist given this date - min G for sfaety ?  ? ?Extremity/Trunk Assessment Upper Extremity Assessment ?LUE Deficits / Details: hx of shoulder sx & rotator cuff injury. Pt extremely limited in ROM of soulder ?LUE Sensation: WNL ?LUE Coordination: decreased gross motor ?  ?Lower Extremity  Assessment ?Lower Extremity Assessment: Defer to PT evaluation ?  ?  ?  ? ?Vision   ?Vision  Assessment?: No apparent visual deficits ?  ?Perception Perception ?Perception: Within Functional Limits ?  ?Praxis Praxis ?Praxis: Intact ?  ? ?Cognition Arousal/Alertness: Awake/alert, Lethargic ?Behavior During Therapy: [facility] for tasks assessed/performed ?Overall Cognitive Status: Within Functional Limits for tasks assessed ?  ?  ?  ?  ?  ?  ?  ?  ?  ?  ?  ?  ?  ?  ?  ?  ?General Comments: pt still a little groggy but overall WFL, willing to participate but painful ?  ?  ?   ?   ?   ?General Comments VSS on RA, 1L nasal canula re-applied at the end of the session. pt coughing throguhout and spitting out phlegm  ? ? ?Pertinent Vitals/ Pain       Pain Assessment ?Pain Assessment: Faces ?Faces Pain Scale: Hurts even more ?Pain Location: RLE, back, hips ?Pain Descriptors / Indicators: Discomfort, Grimacing, Guarding ?Pain Intervention(s): Limited activity within patient's tolerance ? ? ?Frequency ? Min 2X/week  ? ? ? ? ?  ?Progress Toward Goals ? ?OT Goals(current goals can now be found in the care plan section) ? Progress towards OT goals: Progressing toward goals ? ?Acute Rehab OT Goals ?Patient Stated Goal: less pain ?OT Goal Formulation: With patient ?Time For Goal Achievement: 08/11/21 ?Potential to Achieve Goals: Good ?ADL Goals ?Pt Will Perform Lower Body Bathing: with modified independence;sit to/from stand ?Pt Will Perform Lower Body Dressing: with modified independence;sit to/from stand ?Pt Will Transfer to Toilet: with modified independence;ambulating ?Pt Will Perform Toileting - Clothing Manipulation and hygiene: with modified independence;sit to/from stand  ?Plan Discharge plan remains appropriate   ? ?   ?AM-PAC OT "6 Clicks" Daily Activity     ?Outcome Measure ? ? Help from another person eating meals?: A Little ?Help from another person taking care of personal grooming?: A Little ?Help from another person toileting, which includes using toliet, bedpan, or urinal?: A Lot ?Help from another person bathing  (including washing, rinsing, drying)?: A Lot ?Help from another person to put on and taking off regular upper body clothing?: A Little ?Help from another person to put on and taking off regular lower body clothing?: A Lot ?6 Click Score: 15 ? ?  ?End of Session Equipment Utilized During Treatment: Rolling walker (2 wheels) ? ?OT Visit Diagnosis: Unsteadiness on feet (R26.81);Other abnormalities of gait and mobility (R26.89);Muscle weakness (generalized) (M62.81);History of falling (Z91.81);Pain ?  ?Activity Tolerance Patient tolerated treatment well ?  ?Patient Left in bed;with call bell/phone within reach;with bed alarm set ?  ?Nurse Communication Mobility status ?  ? ?   ? ?Time: 6811-5726 ?OT Time Calculation (min): 20 min ? ?Charges: OT General Charges ?$OT Visit: 1 Visit ?OT Treatments ?$Therapeutic Activity: 8-22 mins ? ? ? ?Briyonna Omara A Lacorey Brusca ?08/05/2021, 10:20 AM ?

## 2021-08-06 ENCOUNTER — Other Ambulatory Visit (HOSPITAL_COMMUNITY): Payer: Self-pay

## 2021-08-06 LAB — CBC
HCT: 33.6 % — ABNORMAL LOW (ref 36.0–46.0)
Hemoglobin: 11 g/dL — ABNORMAL LOW (ref 12.0–15.0)
MCH: 29.2 pg (ref 26.0–34.0)
MCHC: 32.7 g/dL (ref 30.0–36.0)
MCV: 89.1 fL (ref 80.0–100.0)
Platelets: 265 10*3/uL (ref 150–400)
RBC: 3.77 MIL/uL — ABNORMAL LOW (ref 3.87–5.11)
RDW: 17.5 % — ABNORMAL HIGH (ref 11.5–15.5)
WBC: 9.4 10*3/uL (ref 4.0–10.5)
nRBC: 0 % (ref 0.0–0.2)

## 2021-08-06 LAB — BASIC METABOLIC PANEL
Anion gap: 6 (ref 5–15)
BUN: 11 mg/dL (ref 8–23)
CO2: 27 mmol/L (ref 22–32)
Calcium: 8.8 mg/dL — ABNORMAL LOW (ref 8.9–10.3)
Chloride: 99 mmol/L (ref 98–111)
Creatinine, Ser: 0.98 mg/dL (ref 0.44–1.00)
GFR, Estimated: 56 mL/min — ABNORMAL LOW (ref >60)
Glucose, Bld: 103 mg/dL — ABNORMAL HIGH (ref 70–99)
Potassium: 4.2 mmol/L (ref 3.5–5.1)
Sodium: 132 mmol/L — ABNORMAL LOW (ref 135–145)

## 2021-08-06 MED ORDER — FERROUS SULFATE 325 (65 FE) MG PO TABS: 325.0000 mg | ORAL_TABLET | Freq: Two times a day (BID) | ORAL | 3 refills | Status: DC

## 2021-08-06 MED ORDER — APIXABAN 5 MG PO TABS: 5.0000 mg | ORAL_TABLET | Freq: Two times a day (BID) | ORAL | 2 refills | Status: DC

## 2021-08-06 MED ORDER — PIPERACILLIN-TAZOBACTAM IV (FOR PTA / DISCHARGE USE ONLY): 13.5000 g | INTRAVENOUS | 0 refills | Status: DC

## 2021-08-06 MED ORDER — VANCOMYCIN HCL 125 MG PO CAPS
125.0000 mg | ORAL_CAPSULE | Freq: Two times a day (BID) | ORAL | 0 refills | Status: AC
  Filled 2021-08-06 (×2): qty 92, 46d supply, fill #0

## 2021-08-06 MED ORDER — LOPERAMIDE HCL 2 MG PO CAPS: 2.0000 mg | ORAL_CAPSULE | Freq: Three times a day (TID) | ORAL | 0 refills | Status: DC | PRN

## 2021-08-06 MED ORDER — METOPROLOL TARTRATE 50 MG PO TABS: 50.0000 mg | ORAL_TABLET | Freq: Two times a day (BID) | ORAL | 2 refills | Status: DC

## 2021-08-06 MED ORDER — HYDROCODONE-ACETAMINOPHEN 5-325 MG PO TABS: ORAL_TABLET | ORAL | 0 refills | Status: DC

## 2021-08-06 NOTE — Progress Notes (Addendum)
?   ? ?[facility] ? ?Date of Admission:  07/23/2021    ?       ?Reason for visit: Follow up on discitis/OM, pelvic abscess ? ?Current antibiotics: ?Piperacillin/tazobactam 4/12-present ?Vancomycin p.o. 3/31-present ?  ?Previous antibiotics: ?Daptomycin 3/31-4/2 ?Ceftriaxone 3/31-4/3 ?Ampicillin sulbactam 4/2--4/12 ? ? ?ASSESSMENT:   ? ?88 y.o. female admitted with: ? ?Discitis/osteomyelitis L5-S1 secondary to Enterococcus faecalis and potentially other organisms (Citrobacter koseri and Proteus mirabilis isolated in pelvic abscess cultures): Spinal infection initially noted on MRI without contrast 3/30 following several weeks of worsening back pain.  MRI with contrast on 3/31 also noted an additional 4 mm epidural phlegmon without drainable component.  Status post IR disc aspiration 3/31 with cultures yielding Enterococcus faecalis.  Repeat MRI 4/7 due to worsening inflammatory markers and unrelenting back pain significant for expected ongoing discitis/osteomyelitis as well as anterior epidural phlegmon measuring up to 6 mm with a developing small abscess measuring 1.2 x 0.4 cm. ?Intra-abdominal abscess: This was initially noted on imaging as an outpatient in early March by her surgeon.  She incidentally also received approximately 2 weeks of Augmentin from her PCP related to a potential respiratory infection from 3/1 through 3/14.  She had a follow-up CT scan on 3/31 which then noted improving fluid collections, however, MRI on 4/7 again noted a rim-enhancing collection posterior to the rectum measuring 6.2 x 2.4 cm consistent with seroma or abscess.  Status post IR CT-guided aspiration on 4/10 yielding 6 mL of purulent fluid.  Cultures with Proteus and Citrobacter.  Susceptibilities pending.  Collection was noted to be too small for drain placement but abscess cavity was felt to be decompressed following aspiration ?History of recurrent C. difficile: Currently on oral vancomycin  prophylaxis. ?CKD stage III: Slight increase in creatinine today. ?Recent rectal prolapse surgery: Completed on 1/31. ? ? ?RECOMMENDATIONS:   ? ?Continue Zosyn ?Continue Vancomycin prophylaxis through 5/29 ?See OPAT note below ? ?Diagnosis: ?Discitis/OM and pelvic abscess ? ?Culture Result: E faecalis, Proteus, Citrobacter ? ?Allergies  ?Allergen Reactions  ? Amlodipine Other (See Comments)  ?  Stomach pains--"feel like my insides are on fire"  ? Ace Inhibitors Cough  ? Carvedilol Other (See Comments)  ?  fatigue  ? Losartan Other (See Comments)  ?  Cr bumped  ? Other Other (See Comments)  ?  No seeds or corn because of IBS  ? Paroxetine Hcl Other (See Comments)  ?  Not effective - felt ill on this medicine  ? Risedronate Sodium   ?  REACTION: joint pain  ? Simvastatin   ?  REACTION: the full 20 mg pill causes leg pain- can tol 10 mg  ? Toprol Xl [Metoprolol] Diarrhea  ?  Diarrhea and weakness  ? Alendronate Sodium Palpitations  ? Influenza Vaccines Rash and Other (See Comments)  ?  Led to mental breakdown in 61s ?  ? Silenor [Doxepin Hcl] Palpitations  ?  Hallucinations and racing heart  ? Sulfonamide Derivatives Rash  ? ? ?OPAT Orders ?Discharge antibiotics to be given via PICC line ?Discharge antibiotics: ?Per pharmacy protocol Zosyn continuous infusion ? ?Duration: ?6 weeks ? ?End Date: ?09/14/21 ? ?[facility] Care Per Protocol: ? ?Home health RN for IV administration and teaching; PICC line care and labs.   ? ?Labs weekly while on IV antibiotics: ?_xx_ CBC with differential ?_xx_ BMP ?__ CMP ?_xx_ CRP ?_xx_ ESR ?__ Vancomycin trough ?__ CK ? ?xx__ Please pull PIC at completion of IV antibiotics ?__ Please  leave PIC in place until doctor has seen patient or been notified ? ?Fax weekly labs to [phone] ? ?Clinic Follow Up Appt: ?08/26/21 at 2:30pm with Dr Juleen China ? ? ? ?Principal Problem: ?  Discitis ?Active Problems: ?  Hypothyroidism ?  Hypertension, essential ?  Valvular heart disease ?  Stage 3a chronic kidney  disease (CKD) ([place]) ?  Cough ?  Recurrent Clostridioides difficile diarrhea ?  Right-sided low back pain with right-sided sciatica ?  New onset a-fib [facility]) ?  Abscess ? ? ? ?MEDICATIONS:   ? ?Scheduled Meds: ? acetaminophen  500 mg Oral QID  ? apixaban  5 mg Oral BID  ? brimonidine  1 drop Both Eyes Daily  ? Chlorhexidine Gluconate Cloth  6 each Topical Daily  ? clonazePAM  0.5 mg Oral QHS  ? ferrous sulfate  325 mg Oral BID WC  ? gabapentin  400 mg Oral TID  ? guaiFENesin  600 mg Oral BID  ? HYDROcodone-acetaminophen  1 tablet Oral TID  ? latanoprost  1 drop Both Eyes QHS  ? levothyroxine  100 mcg Oral Daily  ? loperamide  2 mg Oral QHS  ? magnesium oxide  800 mg Oral BID  ? metoprolol tartrate  50 mg Oral BID  ? sertraline  100 mg Oral q AM  ? sodium chloride flush  10-40 mL Intracatheter Q12H  ? vancomycin  125 mg Oral QHS  ? ?Continuous Infusions: ? piperacillin-tazobactam (ZOSYN)  IV 3.375 g (08/06/21 0559)  ? ?PRN Meds:.albuterol, Gerhardt's butt cream, guaiFENesin, HYDROcodone-acetaminophen, loperamide, melatonin, ondansetron **OR** ondansetron (ZOFRAN) IV, sodium chloride flush ? ?SUBJECTIVE:  ? ?24 hour events:  ?No acute events noted overnight ?Planning for discharge today ?Afebrile Tmax 98.4 ?WBC stable ?Creatinine stable ?Pelvic abscess cultures with Proteus mirabilis and Citrobacter koseri ?No new imaging ? ?She is on the bed pan this morning.  No fevers, chills.  Ongoing back pain but able to work more with PT yesterday. ? ?Review of Systems  ?All other systems reviewed and are negative. ? ?  ?OBJECTIVE:  ? ?Blood pressure (!) 162/92, pulse 82, temperature 98.2 ?F (36.8 ?C), temperature source Oral, resp. rate 19, height 5' 1"  (1.549 m), weight 73.5 kg, SpO2 100 %. ?Body mass index is 30.62 kg/m?. ? ?Physical Exam ?Constitutional:   ?   General: She is not in acute distress. ?   Appearance: Normal appearance.  ?HENT:  ?   Head: Normocephalic and atraumatic.  ?Eyes:  ?   Extraocular Movements:  Extraocular movements intact.  ?   Conjunctiva/sclera: Conjunctivae normal.  ?Pulmonary:  ?   Effort: Pulmonary effort is normal. No respiratory distress.  ?   Comments: On nasal cannula ?Abdominal:  ?   General: There is no distension.  ?   Palpations: Abdomen is soft.  ?Musculoskeletal:  ?   Comments: PICC in place.   ?Skin: ?   General: Skin is warm and dry.  ?Neurological:  ?   General: No focal deficit present.  ?   Mental Status: She is alert and oriented to person, place, and time.  ?Psychiatric:     ?   Mood and Affect: Mood normal.     ?   Behavior: Behavior normal.  ? ? ? ?Lab Results: ?Lab Results  ?Component Value Date  ? WBC 9.4 08/06/2021  ? HGB 11.0 (L) 08/06/2021  ? HCT 33.6 (L) 08/06/2021  ? MCV 89.1 08/06/2021  ? PLT 265 08/06/2021  ?  ?Lab Results  ?Component Value  Date  ? NA 132 (L) 08/06/2021  ? K 4.2 08/06/2021  ? CO2 27 08/06/2021  ? GLUCOSE 103 (H) 08/06/2021  ? BUN 11 08/06/2021  ? CREATININE 0.98 08/06/2021  ? CALCIUM 8.8 (L) 08/06/2021  ? GFRNONAA 56 (L) 08/06/2021  ? GFRAA 58 (L) 03/28/2019  ?  ?Lab Results  ?Component Value Date  ? ALT 13 07/27/2021  ? AST 17 07/27/2021  ? ALKPHOS 77 07/27/2021  ? BILITOT 0.1 (L) 07/27/2021  ? ? ?   ?Component Value Date/Time  ? CRP 9.5 (H) 07/30/2021 1046  ? ? ?   ?Component Value Date/Time  ? ESRSEDRATE 81 (H) 07/30/2021 1046  ? ?  ?I have reviewed the micro and lab results in Epic. ? ?Imaging: ?DG CHEST PORT 1 VIEW ? ?Result Date: 08/04/2021 ?CLINICAL DATA:  Cough EXAM: PORTABLE CHEST 1 VIEW COMPARISON:  Chest x-ray dated July 25, 2021 FINDINGS: Right arm PICC tip is positioned over the expected area of the upper SVC, unchanged compared prior exam. Cardiac and mediastinal contours are unchanged. Small left pleural effusion with associated mild left basilar opacities. No evidence of pneumothorax. IMPRESSION: Unchanged small left pleural effusion with associated mild left basilar opacities which are likely due to atelectasis. Electronically Signed   By:  Yetta Glassman M.D.   On: 08/04/2021 09:47    ? ?Imaging  independently reviewed in Epic.  ? ? ?Mignon Pine ?[facility] ?[place] ?[phone] pager ?08/06/2021, 8:3

## 2021-08-06 NOTE — Discharge Summary (Signed)
?[facility] ? ?Physician Discharge Summary  ? ?Patient ID: ?Brooke Beasley ?MRN: [id number] ?DOB/AGE: 1930-12-14 88 y.o. ? ?Admit date: 07/23/2021 ?Discharge date:   08/06/2021 ? ? ?PCP: [name], MD ? ?DISCHARGE DIAGNOSES:  ?Principal Problem: ?  Discitis ?Active Problems: ?  Right-sided low back pain with right-sided sciatica ?  Abscess ?  Valvular heart disease ?  Cough ?  New onset a-fib [facility]) ?  Hypertension, essential ?  Stage 3a chronic kidney disease (CKD) ([place]) ?  Hypothyroidism ?  Recurrent Clostridioides difficile diarrhea ? ? ?RECOMMENDATIONS FOR OUTPATIENT FOLLOW UP: ?Follow-up with primary care provider in 1 week ?Please call Dr. [name] with cardiology to arrange outpatient follow-up in 2 to 3 weeks for atrial fibrillation and aortic stenosis. ? ? ?Home Health: Home health PT OT RN ?  ? ?CODE STATUS: DNR ? ?DISCHARGE CONDITION: fair ? ?Diet recommendation: Heart healthy ? ?INITIAL HISTORY: ?88 y.o. female with PMH significant for HTN, HLD, carotid stenosis, CKD 3, recurrent C. difficile infection, anxiety/depression, hiatal hernia, GERD, hypothyroidism, osteoarthritis who had multiple prior admissions for syncope with falls and fractures. ?On 07/13/2021 patient was seen at the PCPs office with 2 months of intermittent discomfort and low back pain worsening over a period of 2 weeks.  With the suspicion of right sciatica, she was given steroid taper and as needed hydrocodone.  She also recently completed 2 weeks course of oral Augmentin. On 07/23/2021, because of unrelenting pain, outpatient MRI was performed which showed phlegmon versus abscess L5-S1 with T1 abnormality. Patient was sent to the ED. she was hospitalized for further management.  Noted to be in new onset atrial fibrillation. ?  ?3/31, follow-up MRI with contrast showed a 4 mm epidural phlegmon without drainable component. ?3/31, underwent disc aspiration that grew Enterococcus.  Following CT scan showed decrease in the  size of seroma. ?4/2, started endocarditis coverage with Unasyn ?4/3, TEE negative for endocarditis antibiotics adjusted by ID, PICC line placed by vascular access team ?4/7, MRI lumbar and sacral spine were repeated because of persistent pain and elevated inflammatory markers.  8 showed continued discitis osteomyelitis and L5-S1 level.  It also showed 6 cm size collection posterior to the rectum likely an abscess. ?4/10: CT-guided aspiration of pelvic fluid collection posterior to the rectum. ? ? ?HOSPITAL COURSE:  ? ?* Discitis ?Abscess ?Infection with Enterococcus, Proteus mirabilis and Citrobacter koseri ? ?-Status post IR guided disc aspiration 3/31---fluid culture grew Enterococcus.  TEE negative for endocarditis. ?-Patient continues to have pain and elevated inflammatory markers.  Lumbosacral MRI repeated and it showed persistent discitis, osteomyelitis and L5-S1 level and worsening size of abscess in pelvis posterior to the rectum. ?-IR aspirated this fluid collection on 4/10.  Cultures growing Proteus mirabilis and Citrobacter koseri. ?Patient was initially placed on Unasyn.  Based on new culture data ID has switched her over to Zosyn. ?PICC line was placed on 4/6 3. ?Stop date for antibiotics is 5/22. ?Pain is better controlled on scheduled Vicodin.  Fentanyl patch was discontinued as it was not helping.  Pain is better controlled today.  We will continue current regimen for now.  This was discussed in detail with patient and daughter.  All of their questions were answered. ?Seen by PT and OT.  Home health is recommended.  Has been ordered. Okay for discharge today. ? ? ?Right-sided low back pain with right-sided sciatica ?Secondary to discitis/abscess.  Improved with change in narcotics from fentanyl patch to oral agents.  Able to move her right  leg better than before.  Ambulated with physical therapy.  Home health has been ordered.   ? ?New onset a-fib [facility]) ?-Likely due to infection.  ?-CHA2DS2-VASc  score=5.  ?Started on beta-blocker and Eliquis.  No bleeding noted.  ?Eliquis was started but placed on hold due to need for procedure.  Will resume today. ?Echocardiogram shows normal systolic function with mild to moderate aortic valve stenosis. ?TSH noted to be mildly elevated at 6.63.  Free T4 0.77.  Patient already on levothyroxine for hypothyroidism.  No dose changes needed. ?-Outpatient follow-up Dr. [name] for aortic valve stenosis as needed ? ? ?Cough ?Chest x-ray showed unchanged left-sided pleural effusion with atelectasis.  Continue incentive spirometry.  Continue Mucinex.  Flutter valve.  Patient reassured.   ? ?Stage 3a chronic kidney disease (CKD) ([place]) ?Hyponatremia ? ?Renal function stable for the most part. ? ?Hypertension, essential ?Remains on metoprolol.  Will not be too aggressive with blood pressure control as the pain is contributing to high readings.  Can be monitored in the outpatient setting by PCP. ?Allergy noted to amlodipine, ACE inhibitor, carvedilol, losartan. ? ?Hypothyroidism ?Continue levothyroxine.  Mildly elevated TSH noted with normal free T4. ? ?Recurrent Clostridioides difficile diarrhea ?-Patient has history of prior recurrent C. Difficile requiring prolonged prior vancomycin taper plus Zinplava 04/2019 ?-Currently on secondary C. difficile prophylaxis with vancomycin 125 p.o. daily as per ID. ?Can use Imodium as needed for diarrhea. ? ? ? ?Patient is stable.  Okay for discharge home today with daughter. ? ? ?PERTINENT LABS: ? ?The results of significant diagnostics from this hospitalization (including imaging, microbiology, ancillary and laboratory) are listed below for reference.   ? ?Microbiology: ?Recent Results (from the past 240 hour(s))  ?Aerobic/Anaerobic Culture w Gram Stain (surgical/deep wound)     Status: None (Preliminary result)  ? Collection Time: 08/03/21 12:43 PM  ? Specimen: Abscess  ?Result Value Ref Range Status  ? Specimen Description ABSCESS  Final  ?  Special Requests PELVIS  Final  ? Gram Stain   Final  ?  ABUNDANT WBC PRESENT,BOTH PMN AND MONONUCLEAR ?NO ORGANISMS SEEN ?Performed at [facility], [place] [address], [place], [place] [address] ?  ? Culture   Final  ?  RARE PROTEUS MIRABILIS ?RARE CITROBACTER KOSERI ?SUSCEPTIBILITIES TO FOLLOW ?NO ANAEROBES ISOLATED; CULTURE IN PROGRESS FOR 5 DAYS ?  ? Report Status PENDING  Incomplete  ?  ? ?Labs: ? ?COVID-19 Labs ? ? ?Lab Results  ?Component Value Date  ? [place] NEGATIVE 05/22/2021  ? [place] NEGATIVE 01/14/2021  ? [place] NEGATIVE 09/06/2019  ? ? ? ? ?Basic Metabolic Panel: ?Recent Labs  ?Lab 08/02/21 ?5625 08/03/21 ?6389 08/04/21 ?0221 08/05/21 ?3734 08/06/21 ?0250  ?NA 138 136 137 134* 132*  ?K 4.5 4.8 4.2 4.4 4.2  ?CL 102 99 102 99 99  ?CO2 '28 31 28 30 27  '$ ?GLUCOSE 101* 120* 105* 102* 103*  ?BUN '8 9 10 9 11  '$ ?CREATININE 1.07* 1.14* 0.88 0.97 0.98  ?CALCIUM 9.2 9.2 9.0 8.8* 8.8*  ? ? ?CBC: ?Recent Labs  ?Lab 08/01/21 ?0219 08/02/21 ?2876 08/03/21 ?8115 08/04/21 ?0221 08/05/21 ?7262 08/06/21 ?0250  ?WBC 9.3 10.2 8.5 8.7 8.3 9.4  ?NEUTROABS 5.6 6.2 4.9  --   --   --   ?HGB 9.7* 11.0* 10.6* 11.4* 9.8* 11.0*  ?HCT 30.9* 34.7* 32.7* 35.4* 31.4* 33.6*  ?MCV 91.2 90.1 89.3 90.1 91.3 89.1  ?PLT 246 247 259 258 220 265  ? ? ? ? ?IMAGING STUDIES ?DG  Chest 2 View ? ?Result Date: 07/29/2021 ?CLINICAL DATA:  Chest pain. EXAM: CHEST - 2 VIEW COMPARISON:  07/27/2021 FINDINGS: The heart is mildly enlarged but stable. Stable tortuosity and calcification of the thoracic aorta. The right PICC line tip is in the mid SVC. Persistent bibasilar atelectasis or infiltrates and small left pleural effusion. IMPRESSION: Persistent bibasilar atelectasis or infiltrates and small left effusion. Electronically Signed   By: Marijo Sanes M.D.   On: 07/29/2021 07:41  ? ?DG Lumbar Spine Complete ? ?Result Date: 07/16/2021 ?CLINICAL DATA:  Low back pain radiating into right leg. EXAM: LUMBAR SPINE - COMPLETE 4+ VIEW COMPARISON:   02/19/2008 FINDINGS: No fracture or bone lesion. Moderate dextroscoliosis, apex at L3. Grade 1 anterolisthesis L4 on L5.  No other spondylolisthesis. Loss of disc height with small endplate spurs along the

## 2021-08-06 NOTE — Progress Notes (Signed)
Pt discharged home after teaching with home health services for PICC line care. Pt's daughter educated on this as well as all discharge information. Wheelchair, TOC medications, and all room belongings taken with pt to private vehicle.  ? ?Justice Rocher, RN ? ? ? ?

## 2021-08-07 ENCOUNTER — Other Ambulatory Visit: Payer: Self-pay | Admitting: Family Medicine

## 2021-08-07 DIAGNOSIS — R262 Difficulty in walking, not elsewhere classified: Secondary | ICD-10-CM | POA: Diagnosis not present

## 2021-08-07 DIAGNOSIS — I10 Essential (primary) hypertension: Secondary | ICD-10-CM | POA: Diagnosis not present

## 2021-08-07 NOTE — Telephone Encounter (Signed)
ERx 

## 2021-08-07 NOTE — Telephone Encounter (Signed)
Name of Medication: Clonazepam ?Name of Pharmacy: [facility] ?Last Fill or Written Date and Quantity: 06/30/21, #30 ?Last Office Visit and Type: 07/13/21, back/leg pain ?Next Office Visit and Type: 08/19/21, hosp f/u ?Last Controlled Substance Agreement Date: 09/26/14 ?Last UDS: 09/26/14 ? ? ?

## 2021-08-08 LAB — AEROBIC/ANAEROBIC CULTURE W GRAM STAIN (SURGICAL/DEEP WOUND)

## 2021-08-10 ENCOUNTER — Telehealth: Payer: Self-pay

## 2021-08-10 DIAGNOSIS — I129 Hypertensive chronic kidney disease with stage 1 through stage 4 chronic kidney disease, or unspecified chronic kidney disease: Secondary | ICD-10-CM | POA: Diagnosis not present

## 2021-08-10 DIAGNOSIS — M5114 Intervertebral disc disorders with radiculopathy, thoracic region: Secondary | ICD-10-CM | POA: Diagnosis not present

## 2021-08-10 DIAGNOSIS — M810 Age-related osteoporosis without current pathological fracture: Secondary | ICD-10-CM | POA: Diagnosis not present

## 2021-08-10 DIAGNOSIS — M4316 Spondylolisthesis, lumbar region: Secondary | ICD-10-CM | POA: Diagnosis not present

## 2021-08-10 DIAGNOSIS — M4647 Discitis, unspecified, lumbosacral region: Secondary | ICD-10-CM | POA: Diagnosis not present

## 2021-08-10 DIAGNOSIS — I350 Nonrheumatic aortic (valve) stenosis: Secondary | ICD-10-CM | POA: Diagnosis not present

## 2021-08-10 DIAGNOSIS — M4627 Osteomyelitis of vertebra, lumbosacral region: Secondary | ICD-10-CM | POA: Diagnosis not present

## 2021-08-10 DIAGNOSIS — M5116 Intervertebral disc disorders with radiculopathy, lumbar region: Secondary | ICD-10-CM | POA: Diagnosis not present

## 2021-08-10 DIAGNOSIS — N1831 Chronic kidney disease, stage 3a: Secondary | ICD-10-CM | POA: Diagnosis not present

## 2021-08-10 DIAGNOSIS — F32A Depression, unspecified: Secondary | ICD-10-CM | POA: Diagnosis not present

## 2021-08-10 DIAGNOSIS — E785 Hyperlipidemia, unspecified: Secondary | ICD-10-CM | POA: Diagnosis not present

## 2021-08-10 DIAGNOSIS — M5117 Intervertebral disc disorders with radiculopathy, lumbosacral region: Secondary | ICD-10-CM | POA: Diagnosis not present

## 2021-08-10 DIAGNOSIS — M4646 Discitis, unspecified, lumbar region: Secondary | ICD-10-CM | POA: Diagnosis not present

## 2021-08-10 DIAGNOSIS — K219 Gastro-esophageal reflux disease without esophagitis: Secondary | ICD-10-CM | POA: Diagnosis not present

## 2021-08-10 DIAGNOSIS — K5790 Diverticulosis of intestine, part unspecified, without perforation or abscess without bleeding: Secondary | ICD-10-CM | POA: Diagnosis not present

## 2021-08-10 DIAGNOSIS — K589 Irritable bowel syndrome without diarrhea: Secondary | ICD-10-CM | POA: Diagnosis not present

## 2021-08-10 DIAGNOSIS — M4807 Spinal stenosis, lumbosacral region: Secondary | ICD-10-CM | POA: Diagnosis not present

## 2021-08-10 DIAGNOSIS — B964 Proteus (mirabilis) (morganii) as the cause of diseases classified elsewhere: Secondary | ICD-10-CM | POA: Diagnosis not present

## 2021-08-10 DIAGNOSIS — K449 Diaphragmatic hernia without obstruction or gangrene: Secondary | ICD-10-CM | POA: Diagnosis not present

## 2021-08-10 DIAGNOSIS — B952 Enterococcus as the cause of diseases classified elsewhere: Secondary | ICD-10-CM | POA: Diagnosis not present

## 2021-08-10 DIAGNOSIS — F419 Anxiety disorder, unspecified: Secondary | ICD-10-CM | POA: Diagnosis not present

## 2021-08-10 DIAGNOSIS — I4891 Unspecified atrial fibrillation: Secondary | ICD-10-CM | POA: Diagnosis not present

## 2021-08-10 DIAGNOSIS — E039 Hypothyroidism, unspecified: Secondary | ICD-10-CM | POA: Diagnosis not present

## 2021-08-10 DIAGNOSIS — M47817 Spondylosis without myelopathy or radiculopathy, lumbosacral region: Secondary | ICD-10-CM | POA: Diagnosis not present

## 2021-08-10 DIAGNOSIS — H409 Unspecified glaucoma: Secondary | ICD-10-CM | POA: Diagnosis not present

## 2021-08-10 NOTE — Telephone Encounter (Addendum)
Transition Care Management Unsuccessful Follow-up Telephone Call ? ?Date of discharge and from where:  [place] 07-06-21 Dx: Discitis ? ?Attempts:  1st Attempt ? ?Reason for unsuccessful TCM follow-up call:  Left voice message ? ?Transition Care Management Follow-up Telephone Call ?Date of discharge and from where: [place] 07-06-21 Dx: Discitis ?How have you been since you were released from the hospital? Doing ok just in pain all the time  ?Any questions or concerns? No ? ?Items Reviewed: ?Did the pt receive and understand the discharge instructions provided? Yes  ?Medications obtained and verified? Yes  ?Other? No  ?Any new allergies since your discharge? No  ?Dietary orders reviewed? Yes ?Do you have support at home? Yes  ? ?Home Care and Equipment/Supplies: ?Were home health services ordered? Yes PT/OT/RN ?If so, what is the name of the agency? [facility]  ?Has the agency set up a time to come to the patient's home? yes ?Were any new equipment or medical supplies ordered?  Yes: wheelchair ?What is the name of the medical supply agency? hospital ?Were you able to get the supplies/equipment? yes ?Do you have any questions related to the use of the equipment or supplies? No ? ?Functional Questionnaire: (I = Independent and D = Dependent) ?ADLs: D ? ?Bathing/Dressing- D ? ?Meal Prep- D ? ?Eating- I ? ?Maintaining continence- I ? ?Transferring/Ambulation- I ? ?Managing Meds- D ? ?Follow up appointments reviewed: ? ?PCP Hospital f/u appt confirmed? Yes  Scheduled to see Dr Danise Mina on 08-19-21 @ 10am. ?[facility] f/u appt confirmed? Yes  Scheduled to see Dr Juleen China on 08-26-21 @ 230pm. ?Are transportation arrangements needed? No  ?If their condition worsens, is the pt aware to call PCP or go to the Emergency Dept.? Yes ?Was the patient provided with contact information for the PCP's office or ED? Yes ?Was to pt encouraged to call back with questions or concerns? Yes  ? ?  ?

## 2021-08-12 ENCOUNTER — Encounter: Payer: Self-pay | Admitting: Family Medicine

## 2021-08-12 ENCOUNTER — Emergency Department (HOSPITAL_BASED_OUTPATIENT_CLINIC_OR_DEPARTMENT_OTHER)
Admission: EM | Admit: 2021-08-12 | Discharge: 2021-08-12 | Disposition: A | Discharge status: Home / Self Care | Payer: PPO | Attending: Emergency Medicine | Admitting: Emergency Medicine

## 2021-08-12 ENCOUNTER — Telehealth: Payer: Self-pay | Admitting: Family Medicine

## 2021-08-12 ENCOUNTER — Encounter (HOSPITAL_BASED_OUTPATIENT_CLINIC_OR_DEPARTMENT_OTHER): Payer: Self-pay

## 2021-08-12 ENCOUNTER — Other Ambulatory Visit: Payer: Self-pay

## 2021-08-12 DIAGNOSIS — T82838A Hemorrhage of vascular prosthetic devices, implants and grafts, initial encounter: Secondary | ICD-10-CM | POA: Insufficient documentation

## 2021-08-12 DIAGNOSIS — Z7901 Long term (current) use of anticoagulants: Secondary | ICD-10-CM | POA: Diagnosis not present

## 2021-08-12 NOTE — Telephone Encounter (Signed)
Brooke Beasley notified by telephone that Dr. Danise Mina agrees with her request. ?

## 2021-08-12 NOTE — Telephone Encounter (Signed)
error 

## 2021-08-12 NOTE — Telephone Encounter (Signed)
Agree with this. Thanks.  

## 2021-08-12 NOTE — Discharge Instructions (Addendum)
Follow-up with your PICC line nurse this coming Monday.  Return back to the ER if you have recurrent bleeding that comes out of the dressing, fevers worsening pain or any additional concerns. ?

## 2021-08-12 NOTE — ED Provider Notes (Signed)
?[facility] ?Provider Note ? ? ?CSN: [id number] ?Arrival date & time: 08/12/21  0847 ? ?  ? ?History ? ?Chief Complaint  ?Patient presents with  ? Coagulation Disorder  ? ? ?[facility] Beasley is a 88 y.o. female. ? ?Patient presents with bleeding from her PICC line insertion site.  She noticed this today.  They contacted the PICC nurse and was directed to present to the ER.  Patient otherwise denies any fevers or cough or vomiting or diarrhea.  Denies any new pains.  She was recently hospitalized for spinal infection and discharged with prolonged course of Zosyn via PICC line.  Recently also diagnosed with atrial fibrillation and started on Eliquis which she has been continuing. ? ? ?  ? ?Home Medications ?Prior to Admission medications   ?Medication Sig Start Date End Date Taking? Authorizing Provider  ?acetaminophen (TYLENOL) 500 MG tablet Take 500-1,000 mg by mouth every 6 (six) hours as needed for moderate pain. Depends on pain    [provider]  ?albuterol (PROVENTIL HFA;VENTOLIN HFA) 108 (90 Base) MCG/ACT inhaler Inhale 2 puffs into the lungs every 6 (six) hours as needed for wheezing or shortness of breath. 01/12/18   [name], MD  ?[facility]cr [facility] P 0.1 % SOLN Place 1 drop into both eyes daily. 03/10/21   [provider]  ?apixaban (ELIQUIS) 5 MG TABS tablet Take 1 tablet (5 mg total) by mouth 2 (two) times daily. 08/06/21   Bonnielee Haff, MD  ?bimatoprost (LUMIGAN) 0.01 % SOLN Place 1 drop into both eyes at bedtime.    [provider]  ?Bioflavonoid Products (VITAMIN C) CHEW Chew 1 tablet by mouth daily.    [provider]  ?bisacodyl (DULCOLAX) 5 MG EC tablet Take 1 tablet (5 mg total) by mouth daily as needed for moderate constipation. 01/16/21 01/16/22  Lavina Hamman, MD  ?cholecalciferol (VITAMIN D) 1000 units tablet Take 1,000 Units by mouth daily.    [provider]  ?clonazePAM (KLONOPIN) 0.5 MG tablet Take 1 tablet (0.5  mg total) by mouth at bedtime. 08/07/21   [name], MD  ?colchicine 0.6 MG tablet Take 1 tablet (0.6 mg total) by mouth daily as needed (gout flare). 12/05/20   [name], MD  ?Famotidine-Ca Carb-Mag Hydrox (PEPCID COMPLETE PO) Take 1 tablet by mouth daily as needed (indigestion).    [provider]  ?ferrous sulfate 325 (65 FE) MG tablet Take 1 tablet (325 mg total) by mouth 2 (two) times daily with a meal. 08/06/21   Bonnielee Haff, MD  ?furosemide (LASIX) 20 MG tablet Take 20 mg by mouth daily as needed for edema or fluid.    [provider]  ?gabapentin (NEURONTIN) 400 MG capsule Take 1 capsule (400 mg total) by mouth 3 (three) times daily. 08/05/21   Bonnielee Haff, MD  ?HYDROcodone-acetaminophen (NORCO/VICODIN) 5-325 MG tablet Take 1 tablet 3 times a day (every 8 hours) for 2 days and then take 1 tablet every 4-6 hours as needed for severe pain. 08/06/21   Bonnielee Haff, MD  ?hydrocortisone 2.5 % cream Place 1 application rectally daily as needed (hemorrhoids). 10/23/18   [provider]  ?Infant Care Products [facility]) OINT Apply 1 application topically 2 (two) times daily as needed. 06/16/21   Venia Carbon, MD  ?Iron-FA-B Cmp-C-Biot-Probiotic (FUSION PLUS) CAPS Take 1 capsule by mouth once daily 06/22/21   Lin Landsman, MD  ?levothyroxine (SYNTHROID) 100 MCG tablet Take 1 tablet by mouth once daily 04/08/21  [name], MD  ?loperamide (IMODIUM) 2 MG capsule Take 1 capsule (2 mg total) by mouth 3 (three) times daily as needed for diarrhea or loose stools. 08/06/21   Bonnielee Haff, MD  ?metoprolol tartrate (LOPRESSOR) 50 MG tablet Take 1 tablet (50 mg total) by mouth 2 (two) times daily. 08/06/21   Bonnielee Haff, MD  ?Misc Natural Products (OSTEO BI-FLEX TRIPLE STRENGTH PO) Take 1 tablet by mouth daily.    [provider]  ?Multiple Vitamin (MULTIVITAMIN ADULT PO) Take by mouth daily.    [provider]  ?omeprazole (PRILOSEC)  20 MG capsule Take 20 mg by mouth daily as needed (heartburn).    [provider]  ?ondansetron (ZOFRAN) 4 MG tablet Take 1 tablet by mouth every 8 hours as needed for nausea or refractory nausea / vomiting. 05/26/21   Michael Boston, MD  ?piperacillin-tazobactam (ZOSYN) 4.5 (4-0.5) g injection Inject into the vein. 08/06/21   [provider]  ?polyethylene glycol (MIRALAX / GLYCOLAX) 17 g packet Take 17 g by mouth daily.    [provider]  ?sertraline (ZOLOFT) 100 MG tablet Take 100 mg by mouth in the morning. 10/05/20   [name], MD  ?Simethicone 180 MG CAPS Take 1 capsule by mouth daily as needed (indigestion).    [provider]  ?spironolactone (ALDACTONE) 25 MG tablet Take 1 tablet (25 mg total) by mouth daily. 03/31/21   [name], MD  ?vancomycin (VANCOCIN) 125 MG capsule Take 1 capsule (125 mg total) by mouth 2 (two) times daily. 08/06/21 09/21/21  Mignon Pine, DO  ?VOLTAREN 1 % GEL APPLY ONE APPLICATION TOPICALLY 3 TIMES DAILY ?Patient taking differently: Apply 2 g topically 3 (three) times daily as needed (arthritis pain). 09/26/14   [name], MD  ?Wheat Dextrin (BENEFIBER DRINK MIX PO) Take 15 mLs by mouth daily.    [provider]  ?   ? ?Allergies    ?Amlodipine, Ace inhibitors, Carvedilol, Losartan, Other, Paroxetine hcl, Risedronate sodium, Simvastatin, Toprol xl [metoprolol], Alendronate sodium, Influenza vaccines, Silenor [doxepin hcl], and Sulfonamide derivatives   ? ?Review of Systems   ?Review of Systems  ?Constitutional:  Negative for fever.  ?HENT:  Negative for ear pain.   ?Eyes:  Negative for pain.  ?Respiratory:  Negative for cough.   ?Cardiovascular:  Negative for chest pain.  ?Gastrointestinal:  Negative for abdominal pain.  ?Genitourinary:  Negative for flank pain.  ?Musculoskeletal:  Negative for back pain.  ?Skin:  Negative for rash.  ?Neurological:  Negative for headaches.  ? ?Physical Exam ?Updated Vital Signs ?BP  121/71 (BP Location: Left Arm)   Pulse 72   Temp 98.1 ?F (36.7 ?C) (Oral)   Resp 16   SpO2 93%  ?Physical Exam ?Constitutional:   ?   General: She is not in acute distress. ?   Appearance: Normal appearance.  ?HENT:  ?   Head: Normocephalic.  ?   Nose: Nose normal.  ?Eyes:  ?   Extraocular Movements: Extraocular movements intact.  ?Cardiovascular:  ?   Rate and Rhythm: Normal rate.  ?Pulmonary:  ?   Effort: Pulmonary effort is normal.  ?Musculoskeletal:     ?   General: Normal range of motion.  ?   Cervical back: Normal range of motion.  ?   Comments: Right upper extremity with PICC line in place. ?Insertion site appears to have a 2 x 3 cm size clot of blood underneath the dressing.  No active bleeding noted at this time.  Otherwise neurovascularly intact upper extremities.  ?Neurological:  ?   General: No focal deficit present.  ?   Mental Status: She is alert. Mental status is at baseline.  ? ? ?ED Results / Procedures / Treatments   ?Labs ?(all labs ordered are listed, but only abnormal results are displayed) ?Labs Reviewed - No data to display ? ?EKG ?None ? ?Radiology ?No results found. ? ?Procedures ?Procedures  ? ? ?Medications Ordered in ED ?Medications - No data to display ? ?ED Course/ Medical Decision Making/ A&P ?  ?                        ?Medical Decision Making ? ?Patient on cardiac monitoring, atrial fibrillation. ? ?Review of records show recent discharge August 06, 2021 from the hospital. ? ?Patient's presentation discussed with her PICC nurse will be called on the phone and discussed management options. ? ?Right upper extremity dressing replaced by myself with sterile technique.  No active bleeding noted.  New Biopatch and Tegaderm dressing placed, secured with Coban wrap lightly. ? ?Advised continued follow-up with her PICC nurse this week.  Advised immediate return for recurrent bleeding fevers pain or any additional concerns. ? ? ? ? ? ? ? ?Final Clinical Impression(s) / ED Diagnoses ?Final  diagnoses:  ?Bleeding from peripherally inserted central catheter (PICC), initial encounter [facility])  ? ? ?Rx / DC Orders ?ED Discharge Orders   ? ? None  ? ?  ? ? ?  ?Luna Fuse, MD ?08/12/21 1017 ? ?

## 2021-08-12 NOTE — Telephone Encounter (Signed)
Home Health verbal orders ?Caller Name:Amy ?Agency Name: [facility] ? ?Callback number: [phone] ? ?Requesting OT/PT/Skilled nursing/Social Work/Speech: ? ?Reason:Plan of Care ? ?Frequency:2 times a wk for 2 wks and 1 time a wk for 3 wks ? ?Please forward to [facility] pool or providers CMA  ?

## 2021-08-12 NOTE — ED Triage Notes (Signed)
Pt presents POV for bleeding from her PICC line. Pt recently hospitalized for bacteria in her spine, released 1 week ago. Pt started on Eliquis prior to discharge for A-fib. Admitting MD changed her medication to Eliquis.  ?Bleeding noted under dressing of her PICC line, no swelling or redness noted. ?Pt receiving ABX 24/7. Daughter at bedside changes her meds at night. She did not have any bleeding last night.  ?Woke up at 0530 with a "tiny dot" of blood. 0730 started really bleeding, upon arrival to ED it has started "trickling" from the dressing. ? ?Pt alert and oriented x4  ?Pt appears to be tired, per daughter at bedside this has been her behavior since leaving the hospital  ?

## 2021-08-13 ENCOUNTER — Telehealth: Payer: Self-pay | Admitting: Family Medicine

## 2021-08-13 NOTE — Telephone Encounter (Signed)
Amy from [facility] called to let Dr. Danise Mina know that [facility] Beasley was seen in the ER yesterday. She needs to clarify that their was d/c of eliquis from Dr. Danise Mina. She also stated that patient has had several bouts of vomiting and diarrhea over the last 18 hours. Patient would like to know what is recommended for her to take for this. Patient has been taking zofran and imodium . Please advise.  ?

## 2021-08-14 ENCOUNTER — Ambulatory Visit: Payer: PPO | Admitting: Cardiology

## 2021-08-14 ENCOUNTER — Telehealth: Payer: Self-pay

## 2021-08-14 DIAGNOSIS — M47817 Spondylosis without myelopathy or radiculopathy, lumbosacral region: Secondary | ICD-10-CM

## 2021-08-14 DIAGNOSIS — E039 Hypothyroidism, unspecified: Secondary | ICD-10-CM

## 2021-08-14 DIAGNOSIS — K5790 Diverticulosis of intestine, part unspecified, without perforation or abscess without bleeding: Secondary | ICD-10-CM

## 2021-08-14 DIAGNOSIS — M5114 Intervertebral disc disorders with radiculopathy, thoracic region: Secondary | ICD-10-CM | POA: Diagnosis not present

## 2021-08-14 DIAGNOSIS — I350 Nonrheumatic aortic (valve) stenosis: Secondary | ICD-10-CM | POA: Diagnosis not present

## 2021-08-14 DIAGNOSIS — N1831 Chronic kidney disease, stage 3a: Secondary | ICD-10-CM | POA: Diagnosis not present

## 2021-08-14 DIAGNOSIS — M4646 Discitis, unspecified, lumbar region: Secondary | ICD-10-CM | POA: Diagnosis not present

## 2021-08-14 DIAGNOSIS — M810 Age-related osteoporosis without current pathological fracture: Secondary | ICD-10-CM

## 2021-08-14 DIAGNOSIS — Z452 Encounter for adjustment and management of vascular access device: Secondary | ICD-10-CM

## 2021-08-14 DIAGNOSIS — M4647 Discitis, unspecified, lumbosacral region: Secondary | ICD-10-CM | POA: Diagnosis not present

## 2021-08-14 DIAGNOSIS — K589 Irritable bowel syndrome without diarrhea: Secondary | ICD-10-CM

## 2021-08-14 DIAGNOSIS — M5116 Intervertebral disc disorders with radiculopathy, lumbar region: Secondary | ICD-10-CM | POA: Diagnosis not present

## 2021-08-14 DIAGNOSIS — H409 Unspecified glaucoma: Secondary | ICD-10-CM

## 2021-08-14 DIAGNOSIS — E785 Hyperlipidemia, unspecified: Secondary | ICD-10-CM

## 2021-08-14 DIAGNOSIS — M4807 Spinal stenosis, lumbosacral region: Secondary | ICD-10-CM

## 2021-08-14 DIAGNOSIS — M5117 Intervertebral disc disorders with radiculopathy, lumbosacral region: Secondary | ICD-10-CM | POA: Diagnosis not present

## 2021-08-14 DIAGNOSIS — K219 Gastro-esophageal reflux disease without esophagitis: Secondary | ICD-10-CM

## 2021-08-14 DIAGNOSIS — B952 Enterococcus as the cause of diseases classified elsewhere: Secondary | ICD-10-CM | POA: Diagnosis not present

## 2021-08-14 DIAGNOSIS — I129 Hypertensive chronic kidney disease with stage 1 through stage 4 chronic kidney disease, or unspecified chronic kidney disease: Secondary | ICD-10-CM | POA: Diagnosis not present

## 2021-08-14 DIAGNOSIS — Z8616 Personal history of COVID-19: Secondary | ICD-10-CM

## 2021-08-14 DIAGNOSIS — Z9181 History of falling: Secondary | ICD-10-CM

## 2021-08-14 DIAGNOSIS — Z7901 Long term (current) use of anticoagulants: Secondary | ICD-10-CM

## 2021-08-14 DIAGNOSIS — I4891 Unspecified atrial fibrillation: Secondary | ICD-10-CM | POA: Diagnosis not present

## 2021-08-14 DIAGNOSIS — Z792 Long term (current) use of antibiotics: Secondary | ICD-10-CM

## 2021-08-14 DIAGNOSIS — Z96612 Presence of left artificial shoulder joint: Secondary | ICD-10-CM

## 2021-08-14 DIAGNOSIS — M4627 Osteomyelitis of vertebra, lumbosacral region: Secondary | ICD-10-CM | POA: Diagnosis not present

## 2021-08-14 DIAGNOSIS — F32A Depression, unspecified: Secondary | ICD-10-CM

## 2021-08-14 DIAGNOSIS — B954 Other streptococcus as the cause of diseases classified elsewhere: Secondary | ICD-10-CM | POA: Diagnosis not present

## 2021-08-14 DIAGNOSIS — K449 Diaphragmatic hernia without obstruction or gangrene: Secondary | ICD-10-CM

## 2021-08-14 DIAGNOSIS — F419 Anxiety disorder, unspecified: Secondary | ICD-10-CM

## 2021-08-14 DIAGNOSIS — Z8701 Personal history of pneumonia (recurrent): Secondary | ICD-10-CM

## 2021-08-14 DIAGNOSIS — M4316 Spondylolisthesis, lumbar region: Secondary | ICD-10-CM | POA: Diagnosis not present

## 2021-08-14 MED ORDER — SERTRALINE HCL 100 MG PO TABS: 100.0000 mg | ORAL_TABLET | Freq: Every morning | ORAL | 2 refills | Status: DC

## 2021-08-14 NOTE — Telephone Encounter (Signed)
I think reasonable to hold eliquis until ensure no further bleeding from PICC but then did recommend restarting as per [employer].  ?May continue zofran PRN nausea and imodium PRN constipation.  ?How many episodes of diarrhea/day, any fever, blood in stool, watery stools?  ?

## 2021-08-14 NOTE — Telephone Encounter (Signed)
Spoke with pt relaying Dr. G's message. Pt verbalizes understanding.  

## 2021-08-14 NOTE — Telephone Encounter (Signed)
E-scribed refill 

## 2021-08-14 NOTE — Telephone Encounter (Addendum)
Spoke with Amy relaying Dr. Synthia Innocent message.  She verbalizes understanding and states there is no sign of bleeding from PICC at this time. She will advise pt to restart Eliquis. ? ?Spoke with pt relaying Dr. Synthia Innocent about Zofran and Imodium.  Pt verbalizes understanding.  State she had 2-3 episodes of diarrhea when it first started and a temp of 100.7- max and stool was watery.  Denies any blood in stool or anymore fever.  Says she had improved an is not vomiting any more and has occasional nausea.  But today pt had about 4 episodes of diarrhea.  She thinks it's a virus because her daughter started with sxs today.  ?

## 2021-08-14 NOTE — Telephone Encounter (Addendum)
Noted.  ?Recommend small sips of fluids throughout the day. Let us know if ongoing diarrhea.  ?

## 2021-08-17 ENCOUNTER — Ambulatory Visit (INDEPENDENT_AMBULATORY_CARE_PROVIDER_SITE_OTHER): Payer: PPO | Admitting: Medical

## 2021-08-17 ENCOUNTER — Encounter: Payer: Self-pay | Admitting: Medical

## 2021-08-17 VITALS — BP 108/64 | HR 65 | Ht 61.0 in | Wt 153.0 lb

## 2021-08-17 DIAGNOSIS — Z79899 Other long term (current) drug therapy: Secondary | ICD-10-CM | POA: Diagnosis not present

## 2021-08-17 DIAGNOSIS — D508 Other iron deficiency anemias: Secondary | ICD-10-CM

## 2021-08-17 DIAGNOSIS — I350 Nonrheumatic aortic (valve) stenosis: Secondary | ICD-10-CM

## 2021-08-17 DIAGNOSIS — I4891 Unspecified atrial fibrillation: Secondary | ICD-10-CM | POA: Diagnosis not present

## 2021-08-17 DIAGNOSIS — I10 Essential (primary) hypertension: Secondary | ICD-10-CM

## 2021-08-17 DIAGNOSIS — I5189 Other ill-defined heart diseases: Secondary | ICD-10-CM

## 2021-08-17 DIAGNOSIS — M4646 Discitis, unspecified, lumbar region: Secondary | ICD-10-CM | POA: Diagnosis not present

## 2021-08-17 DIAGNOSIS — R531 Weakness: Secondary | ICD-10-CM | POA: Diagnosis not present

## 2021-08-17 NOTE — Patient Instructions (Addendum)
Medication Instructions:  ?- Your physician recommends that you continue on your current medications as directed. Please refer to the Current Medication list given to you today. ? ?*If you need a refill on your cardiac medications before your next appointment, please call your pharmacy* ? ? ?Lab Work: ?- Your physician recommends that you have lab work today: CBC (order given to be drawn with home health lab later today) ? ?If you have labs (blood work) drawn today and your tests are completely normal, you will receive your results only by: ?MyChart Message (if you have MyChart) OR ?A paper copy in the mail ?If you have any lab test that is abnormal or we need to change your treatment, we will call you to review the results. ? ? ?Testing/Procedures: ?- none ordered ? ? ?Follow-Up: ?At [facility], you and your health needs are our priority.  As part of our continuing mission to provide you with exceptional heart care, we have created designated Provider Care Teams.  These Care Teams include your primary Cardiologist (physician) and Advanced Practice Providers (APPs -  Physician Assistants and Nurse Practitioners) who all work together to provide you with the care you need, when you need it. ? ?We recommend signing up for the patient portal called "MyChart".  Sign up information is provided on this After Visit Summary.  MyChart is used to connect with patients for Virtual Visits (Telemedicine).  Patients are able to view lab/test results, encounter notes, upcoming appointments, etc.  Non-urgent messages can be sent to your provider as well.   ?To learn more about what you can do with MyChart, go to [email or web address].   ? ?Your next appointment:   ?1 month(s) ? ?The format for your next appointment:   ?In Person ? ?Provider:   ?You may see Kate Sable, MD or one of the following Advanced Practice Providers on your designated Care Team:   ? ?Cadence Kathlen Mody, PA-C  ? ? ?Other Instructions ?N/a ? ?Important  Information About Sugar ? ? ? ? ? ? ?

## 2021-08-17 NOTE — Progress Notes (Signed)
?Cardiology Office Note:   ? ?Date:  08/17/2021  ? ?ID:  [facility] Beasley, DOB 08-Jul-1930, MRN [id number] ? ?PCP:  [name], MD  ?[facility] Cardiologist:  Kate Sable, MD  ?[facility] Electrophysiologist:  None  ? ?Referring MD: [name], MD  ? ?Chief Complaint: Hospital follow-up ? ?History of Present Illness:   ? ?Brooke Beasley is a 88 y.o. female with a hx of anxiety, G2DD, HTN, HLD, carotid stenosis, CKD Stage 3, recurrent Cdif infection, hiatal hernia, GERD, mild Aortic stenosis who presents for hospital follow-up.  ? ?Echocardiogram on 11/26/2019 showed normal systolic function, EF 60 to 67%, grade 2 diastolic dysfunction.  Mild to moderate MR, mild AS. ?  ?Patient previously took lisinopril, but stopped due to side effects/cough.  Amlodipine stopped due to abdominal pains, Coreg was stopped because of fatigue. ? ?Previously admitted in 12/2020 for lower GIB from hemorrhoids, diverticulitis. Sigmoidoscopy did not reveal any acute bleeding.  ? ?The patient had surgery in January forr ectal prolapse and intestine removal. Since then had abdominal pain and suspected infection. ? ?Admitted 07/2021 for syncope with a falls and fractures, treated for discitis/osteomylitis and r/o endocarditis found to have new onset Afib. She underwent disc aspiration. TEE was negative for endocarditis and PICC line was placed. CHADSVASC of 5 started on Eliquis and BB. Echo showed normal LVSF and mild to mod AS.  ? ?Today, the patient is accompanied by a family member. She is on IV abx till the end of May. A week ago the PICC line started bleeding and Eliquis was stopped. She has not re-started the Eliquis. She has a home nurse who visits. The patient feels very week still, frustrated over the sequence of events regarding surgery. She denies further PICC line bleeding. EKG today shows rate controlled Afib 65bpm. She is on Lopressor '50mg'$  BID for rate control. She denies chest pain, SOB, LLE,  orthopnea, pnd, or palpitaitons.  ? ?Past Medical History:  ?Diagnosis Date  ? Anxiety   ? C. difficile diarrhea 07/11/2018  ? S/p multiple oral vanc treatments (course and tapers) and completed Zinplava monoclonal Ab treatment (05/10/2019) Fecal transplant on hold during [place] pandemic  ? CAP (community acquired pneumonia) 12/22/2017  ? Carotid stenosis 10/18/2014  ? R 1-39%, L 40-59%, rpt 1 yr (09/2014)   ? CKD (chronic kidney disease) stage 3, GFR 30-59 ml/min (HCC) 08/02/2014  ? Closed fracture of right orbit ([place]) 02/10/2021  ? Clostridioides difficile infection   ? hx 2020  ? COVID-19 virus infection 10/13/2020  ? Degenerative disc disease   ? LS  ? Depression   ? nervous breakdonw in 1964-out of work for a year  ? Diverticulosis   ? severe by colonoscopy  ? Family history of adverse reaction to anesthesia   ? n/v  ? GERD (gastroesophageal reflux disease)   ? Glaucoma   ? Heart murmur 08/2011  ? mitral regurge - on echo   ? History of hiatal hernia   ? History of shingles   ? HTN (hypertension)   ? Hyperlipidemia   ? Hypertension   ? Hypothyroidism   ? IBS 11/02/2006  ? Intestinal bacterial overgrowth   ? In small colon  ? Left shoulder pain 11/04/2014  ? Lower GI bleed 12/2020  ? thought diverticular complicated by ABLA with syncope and orbital fracture s/p hospitalization  ? Maxillary fracture ([place]) 04/08/2012  ? Osteoarthritis 2016  ? Jefm Bryant)  ? Osteoporosis   ? dexa 2011  ? Pseudogout 2016  ?  shoulders ([place])  ? ? ?Past Surgical History:  ?Procedure Laterality Date  ? barium enema  2012  ? severe diverticulosis, redundant colon  ? Bowel obstruction  1999  ? no surgery in hosp x 3 days  ? BUBBLE STUDY  07/27/2021  ? Procedure: BUBBLE STUDY;  Surgeon: Dixie Dials, MD;  Location: [place];  Service: Cardiovascular;;  ? COLONOSCOPY  1. 1999  2. 11/04  ? 1. Not finished  2. Slight hemorrhage rectosigmoid area, severe sig diverticulosis  ? COLONOSCOPY WITH PROPOFOL N/A 09/10/2019  ? SSP with dysplasia, TA,  rpt 3 yrs ([place], Tally Due, MD)  ? Dexa  1. 1884-1660   2. 9/04  3. 3/08   ? 1. OP  2. OP, borderline, spine -2.44T  3. decreased BMD-OP  ? DG KNEE 1-2 VIEWS BILAT    ? LS x-ray with degenerative disc and facet change  ? ESOPHAGOGASTRODUODENOSCOPY    ? Negative  ? FLEXIBLE SIGMOIDOSCOPY N/A 01/16/2021  ? Procedure: FLEXIBLE SIGMOIDOSCOPY;  Surgeon: Gatha Mayer, MD;  Location[facility]COPY;  Service: Endoscopy;  Laterality: N/A;  or unsedated  ? Hemorrhoid procedure  07/2006  ? PROCTOSCOPY N/A 05/26/2021  ? Procedure: RIGID PROCTOSCOPY;  Surgeon: Michael Boston, MD;  Location: [facility];  Service: General;  Laterality: N/A;  ? RECTOPEXY N/A 05/26/2021  ? Procedure: RECTOPEXY;  Surgeon: Michael Boston, MD;  Location: [facility];  Service: General;  Laterality: N/A;  ? TEE WITHOUT CARDIOVERSION N/A 07/27/2021  ? Procedure: TRANSESOPHAGEAL ECHOCARDIOGRAM (TEE);  Surgeon: Dixie Dials, MD;  Location: [place];  Service: Cardiovascular;  Laterality: N/A;  ? TONSILLECTOMY    ? TOTAL SHOULDER ARTHROPLASTY Left 11/27/2015  ? Corky Mull, MD  ? TOTAL SHOULDER REVISION Left 03/16/2016  ? Procedure: TOTAL SHOULDER REVISION;  Surgeon: Corky Mull, MD;  Location: [facility] ORS;  Service: Orthopedics;  Laterality: Left;  ? US ECHOCARDIOGRAPHY  07/2011  ? Normal systolic fxn with EF 63-01%.  Focal basal septal hypertrophy.  Mild diastolic dysfunction.  Mild MR.  ? XI ROBOTIC ASSISTED LOWER ANTERIOR RESECTION N/A 05/26/2021  ? Procedure: ROBOTIC LOW ANTERIOR RECTOSIGMOID RESECTION; ASSESSMENT OF TISSUE PERFUSION WITH FIREFLY;  Surgeon: Michael Boston, MD;  Location: [facility];  Service: General;  Laterality: N/A;  ? ? ?Current Medications: ?Current Meds  ?Medication Sig  ? acetaminophen (TYLENOL) 500 MG tablet Take 500-1,000 mg by mouth every 6 (six) hours as needed for moderate pain. Depends on pain  ? albuterol (PROVENTIL HFA;VENTOLIN HFA) 108 (90 Base) MCG/ACT inhaler Inhale 2 puffs into the lungs every 6 (six) hours as needed for  wheezing or shortness of breath.  ? ALPHAGAN P 0.1 % SOLN Place 1 drop into both eyes daily.  ? apixaban (ELIQUIS) 5 MG TABS tablet Take 1 tablet (5 mg total) by mouth 2 (two) times daily.  ? bimatoprost (LUMIGAN) 0.01 % SOLN Place 1 drop into both eyes at bedtime.  ? Bioflavonoid Products (VITAMIN C) CHEW Chew 1 tablet by mouth daily.  ? bisacodyl (DULCOLAX) 5 MG EC tablet Take 1 tablet (5 mg total) by mouth daily as needed for moderate constipation.  ? cholecalciferol (VITAMIN D) 1000 units tablet Take 1,000 Units by mouth daily.  ? clonazePAM (KLONOPIN) 0.5 MG tablet Take 1 tablet (0.5 mg total) by mouth at bedtime.  ? colchicine 0.6 MG tablet Take 1 tablet (0.6 mg total) by mouth daily as needed (gout flare).  ? Famotidine-Ca Carb-Mag Hydrox (PEPCID COMPLETE PO) Take 1 tablet by mouth daily as needed (  indigestion).  ? ferrous sulfate 325 (65 FE) MG tablet Take 1 tablet (325 mg total) by mouth 2 (two) times daily with a meal.  ? furosemide (LASIX) 20 MG tablet Take 20 mg by mouth daily as needed for edema or fluid.  ? gabapentin (NEURONTIN) 400 MG capsule Take 1 capsule (400 mg total) by mouth 3 (three) times daily.  ? HYDROcodone-acetaminophen (NORCO/VICODIN) 5-325 MG tablet Take 1 tablet 3 times a day (every 8 hours) for 2 days and then take 1 tablet every 4-6 hours as needed for severe pain.  ? hydrocortisone 2.5 % cream Place 1 application rectally daily as needed (hemorrhoids).  ? Infant Care Products [facility]) OINT Apply 1 application topically 2 (two) times daily as needed.  ? Iron-FA-B Cmp-C-Biot-Probiotic (FUSION PLUS) CAPS Take 1 capsule by mouth once daily  ? levothyroxine (SYNTHROID) 100 MCG tablet Take 1 tablet by mouth once daily  ? loperamide (IMODIUM) 2 MG capsule Take 1 capsule (2 mg total) by mouth 3 (three) times daily as needed for diarrhea or loose stools.  ? metoprolol tartrate (LOPRESSOR) 50 MG tablet Take 1 tablet (50 mg total) by mouth 2 (two) times daily.  ? Misc Natural Products  (OSTEO BI-FLEX TRIPLE STRENGTH PO) Take 1 tablet by mouth daily.  ? Multiple Vitamin (MULTIVITAMIN ADULT PO) Take by mouth daily.  ? omeprazole (PRILOSEC) 20 MG capsule Take 20 mg by mouth daily as needed (hear

## 2021-08-19 ENCOUNTER — Encounter: Payer: Self-pay | Admitting: Cardiology

## 2021-08-19 ENCOUNTER — Ambulatory Visit (INDEPENDENT_AMBULATORY_CARE_PROVIDER_SITE_OTHER): Payer: PPO | Admitting: Family Medicine

## 2021-08-19 ENCOUNTER — Encounter: Payer: Self-pay | Admitting: Family Medicine

## 2021-08-19 VITALS — BP 118/72 | HR 56 | Temp 97.7°F | Ht 61.0 in | Wt 152.1 lb

## 2021-08-19 DIAGNOSIS — K611 Rectal abscess: Secondary | ICD-10-CM | POA: Diagnosis not present

## 2021-08-19 DIAGNOSIS — M4646 Discitis, unspecified, lumbar region: Secondary | ICD-10-CM

## 2021-08-19 DIAGNOSIS — I4891 Unspecified atrial fibrillation: Secondary | ICD-10-CM

## 2021-08-19 DIAGNOSIS — M5410 Radiculopathy, site unspecified: Secondary | ICD-10-CM

## 2021-08-19 DIAGNOSIS — A0471 Enterocolitis due to Clostridium difficile, recurrent: Secondary | ICD-10-CM | POA: Diagnosis not present

## 2021-08-19 MED ORDER — ONDANSETRON HCL 4 MG PO TABS: 4.0000 mg | ORAL_TABLET | Freq: Three times a day (TID) | ORAL | 0 refills | Status: DC | PRN

## 2021-08-19 NOTE — Progress Notes (Signed)
? ? Patient ID: Brooke Beasley, female    DOB: 01-Mar-1931, 88 y.o.   MRN: [id number] ? ?This visit was conducted in person. ? ?BP 118/72   Pulse (!) 56   Temp 97.7 ?F (36.5 ?C)   Ht '5\' 1"'$  (1.549 m)   Wt 152 lb 2 oz (69 kg)   SpO2 95%   BMI 28.74 kg/m?   ? ?CC:  hosp f/u visit  ?Subjective:  ? ?HPI: ?Brooke Beasley is a 88 y.o. female presenting on 08/19/2021 for Hospitalization Follow-up (Admitted on 07/23/21 at [facility], dx discitis of lumbar region. Then seen on 08/12/21 at [facility] ED, dx bleeding from PICC.  Pt accompanied by daughter, [name]. ) ? ? ?Recent hospitalization for severe low back pain, found to have L5/S1 discitis with associated phlegmon, initial fluid culture from 3/31 grew Enterococcus, treated with IV Unasyn.  ?TEE negative for endocarditis. ?MRI showed 6 cm fluid collection posterior to the rectum concern for abscess after recent surgery, status post CT-guided aspiration on April 10 - grew Proteus mirabilis and Citrobacter koseri.  IV antibiotic changed to Zosyn, PICC line placed April 6.  Stop date for antibiotics is Sep 14, 2021.  ?Difficult to control pain, managed with hydrocodone as fentanyl patch was ineffective. ?Hospital records reviewed. Med rec performed. ? ?She did have viral gastroenteritis last week, DIL also sick with this - now improved.  ?She's subsequently developed itchy knot to left side.  ?She does have chills but no fever.  ? ?Currently off hydrocodone pain medication, she is using tylenol. She continues taking gabapentin '400mg'$  TID.  ? ?Recent ER visit for bleeding from PICC line-Eliquis was held.  ? ?Home health was set up: [facility].  ?Other follow up appointments scheduled: Saw cardiology clinic on Monday, rec restart Eliquis, continue metoprolol 50 mg twice daily.  Considering cardioversion if she remains in atrial fibrillation.  Planning to wait until finishes IV antibiotic in May to further address atrial fibrillation.  Echocardiogram showed normal ejection  fraction with mild to moderate aortic stenosis. ? ?Upcoming ID appt next week, Dr Juleen China.  ? ?H/o recurrent C Diff - taking oral vancomycin '125mg'$  once daily preventatively - unclear if correct dose is QD or BID.  ? ?Pending eliquis recommencement, waiting on results of CBC drawn by [facility] on Monday (through the PICC line). ?______________________________________________________________________ ?Hospital admission: 07/23/2021 ?Hospital discharge: 08/06/2021 ?TCM f/u phone call: Was performed on 08/10/2021 ? ?DISCHARGE DIAGNOSES:  ?Principal Problem: ?  Discitis ?Active Problems: ?  Right-sided low back pain with right-sided sciatica ?  Abscess ?  Valvular heart disease ?  Cough ?  New onset a-fib [facility]) ?  Hypertension, essential ?  Stage 3a chronic kidney disease (CKD) ([place]) ?  Hypothyroidism ?  Recurrent Clostridioides difficile diarrhea ?  ?RECOMMENDATIONS FOR OUTPATIENT FOLLOW UP: ?Follow-up with primary care provider in 1 week ?Please call Dr. [name] with cardiology to arrange outpatient follow-up in 2 to 3 weeks for atrial fibrillation and aortic stenosis. ? ?Home Health: Home health PT OT RN ?CODE STATUS: DNR  ?DISCHARGE CONDITION: fair ?Diet recommendation: Heart healthy ?   ? ?Relevant past medical, surgical, family and social history reviewed and updated as indicated. Interim medical history since our last visit reviewed. ?Allergies and medications reviewed and updated. ?Outpatient Medications Prior to Visit  ?Medication Sig Dispense Refill  ? acetaminophen (TYLENOL) 500 MG tablet Take 500-1,000 mg by mouth every 6 (six) hours as needed for moderate pain. Depends on pain    ? albuterol (PROVENTIL  HFA;VENTOLIN HFA) 108 (90 Base) MCG/ACT inhaler Inhale 2 puffs into the lungs every 6 (six) hours as needed for wheezing or shortness of breath. 1 Inhaler 0  ? ALPHAGAN P 0.1 % SOLN Place 1 drop into both eyes daily.    ? apixaban (ELIQUIS) 5 MG TABS tablet Take 1 tablet (5 mg total) by mouth 2 (two) times daily. 60  tablet 2  ? bimatoprost (LUMIGAN) 0.01 % SOLN Place 1 drop into both eyes at bedtime.    ? Bioflavonoid Products (VITAMIN C) CHEW Chew 1 tablet by mouth daily.    ? bisacodyl (DULCOLAX) 5 MG EC tablet Take 1 tablet (5 mg total) by mouth daily as needed for moderate constipation. 30 tablet 1  ? cholecalciferol (VITAMIN D) 1000 units tablet Take 1,000 Units by mouth daily.    ? clonazePAM (KLONOPIN) 0.5 MG tablet Take 1 tablet (0.5 mg total) by mouth at bedtime. 30 tablet 0  ? colchicine 0.6 MG tablet Take 1 tablet (0.6 mg total) by mouth daily as needed (gout flare). 90 tablet 0  ? Famotidine-Ca Carb-Mag Hydrox (PEPCID COMPLETE PO) Take 1 tablet by mouth daily as needed (indigestion).    ? ferrous sulfate 325 (65 FE) MG tablet Take 1 tablet (325 mg total) by mouth 2 (two) times daily with a meal. 60 tablet 3  ? furosemide (LASIX) 20 MG tablet Take 20 mg by mouth daily as needed for edema or fluid.    ? gabapentin (NEURONTIN) 400 MG capsule Take 1 capsule (400 mg total) by mouth 3 (three) times daily. 90 capsule 1  ? HYDROcodone-acetaminophen (NORCO/VICODIN) 5-325 MG tablet Take 1 tablet 3 times a day (every 8 hours) for 2 days and then take 1 tablet every 4-6 hours as needed for severe pain. 30 tablet 0  ? hydrocortisone 2.5 % cream Place 1 application rectally daily as needed (hemorrhoids).    ? Infant Care Products [facility]) OINT Apply 1 application topically 2 (two) times daily as needed. 430 g 0  ? Iron-FA-B Cmp-C-Biot-Probiotic (FUSION PLUS) CAPS Take 1 capsule by mouth once daily 30 capsule 2  ? levothyroxine (SYNTHROID) 100 MCG tablet Take 1 tablet by mouth once daily 90 tablet 1  ? loperamide (IMODIUM) 2 MG capsule Take 1 capsule (2 mg total) by mouth 3 (three) times daily as needed for diarrhea or loose stools. 30 capsule 0  ? metoprolol tartrate (LOPRESSOR) 50 MG tablet Take 1 tablet (50 mg total) by mouth 2 (two) times daily. 60 tablet 2  ? Misc Natural Products (OSTEO BI-FLEX TRIPLE STRENGTH PO) Take 1  tablet by mouth daily.    ? Multiple Vitamin (MULTIVITAMIN ADULT PO) Take by mouth daily.    ? omeprazole (PRILOSEC) 20 MG capsule Take 20 mg by mouth daily as needed (heartburn).    ? piperacillin-tazobactam (ZOSYN) 4.5 (4-0.5) g injection Inject into the vein.    ? polyethylene glycol (MIRALAX / GLYCOLAX) 17 g packet Take 17 g by mouth daily.    ? sertraline (ZOLOFT) 100 MG tablet Take 1 tablet (100 mg total) by mouth in the morning. 90 tablet 2  ? Simethicone 180 MG CAPS Take 1 capsule by mouth daily as needed (indigestion).    ? spironolactone (ALDACTONE) 25 MG tablet Take 1 tablet (25 mg total) by mouth daily. 90 tablet 3  ? vancomycin (VANCOCIN) 125 MG capsule Take 1 capsule (125 mg total) by mouth 2 (two) times daily. 92 capsule 0  ? VOLTAREN 1 % GEL APPLY ONE APPLICATION  TOPICALLY 3 TIMES DAILY (Patient taking differently: Apply 2 g topically 3 (three) times daily as needed (arthritis pain).) 100 g 3  ? Wheat Dextrin (BENEFIBER DRINK MIX PO) Take 15 mLs by mouth daily.    ? ondansetron (ZOFRAN) 4 MG tablet Take 1 tablet by mouth every 8 hours as needed for nausea or refractory nausea / vomiting. 8 tablet 5  ? ?No facility-administered medications prior to visit.  ?  ? ?Per HPI unless specifically indicated in ROS section below ?Review of Systems ? ?Objective:  ?BP 118/72   Pulse (!) 56   Temp 97.7 ?F (36.5 ?C)   Ht '5\' 1"'$  (1.549 m)   Wt 152 lb 2 oz (69 kg)   SpO2 95%   BMI 28.74 kg/m?   ?Wt Readings from Last 3 Encounters:  ?08/19/21 152 lb 2 oz (69 kg)  ?08/17/21 153 lb (69.4 kg)  ?08/06/21 162 lb 0.6 oz (73.5 kg)  ?  ?  ?Physical Exam ?Vitals and nursing note reviewed.  ?Constitutional:   ?   Appearance: Normal appearance. She is not ill-appearing.  ?   Comments: In wheelchair  ?Cardiovascular:  ?   Rate and Rhythm: Normal rate. Rhythm irregularly irregular.  ?   Pulses: Normal pulses.  ?   Heart sounds: Normal heart sounds. No murmur heard. ?Pulmonary:  ?   Effort: Pulmonary effort is normal. No  respiratory distress.  ?   Breath sounds: Normal breath sounds. No wheezing, rhonchi or rales.  ?Musculoskeletal:  ?   Right lower leg: No edema.  ?   Left lower leg: No edema.  ?Skin: ?   General: Skin is warm and dry.

## 2021-08-19 NOTE — Patient Instructions (Addendum)
Good to see you today. ?I'm glad you're recovering at home.  ?Stay off hydrocodone only take tylenol for pain.  ?Try lower frequency gabapentin to see if you still need it.  ?Keep follow up appointment.  ?

## 2021-08-20 ENCOUNTER — Encounter: Payer: Self-pay | Admitting: Family Medicine

## 2021-08-20 NOTE — Assessment & Plan Note (Signed)
S/p CT guided aspiration growing Proteus and Citrobacter koseri - currently receiving IV Zosyn through PICC x 6 wks. ID f/u planned next week.  ?

## 2021-08-20 NOTE — Assessment & Plan Note (Signed)
Diskitis related ?

## 2021-08-20 NOTE — Assessment & Plan Note (Signed)
Appreciate care of all involved - hospitalists and ID. Currently receiving IV Zosyn through PICC line planned 6 wks (to end 5/22). Has ID f/u planned next week. Pain has been improving, currently managed only with tylenol. She desires to avoid opiates given poor effect and significant side effects while hospitalized.  ?

## 2021-08-20 NOTE — Assessment & Plan Note (Addendum)
Medical illness triggered afib- started on eliquis however this was held after oozing from PICC site developed - which has now resolved. Has seen afib clinic in f/u, planned CBC prior to restarting eliquis. CBC has been drawn by [facility], pending receiving results. Daughter will check with cards.  ?

## 2021-08-20 NOTE — Assessment & Plan Note (Signed)
H/o this, receiving oral vancomycin BID preventatively while on IV Zosyn.  ?

## 2021-08-21 ENCOUNTER — Telehealth: Payer: Self-pay

## 2021-08-21 DIAGNOSIS — M4646 Discitis, unspecified, lumbar region: Secondary | ICD-10-CM | POA: Diagnosis not present

## 2021-08-21 NOTE — Telephone Encounter (Signed)
Prior authorization sent to plan via [email or web address] for Ondansetron '4mg'$  tab. We will be notified of the decision via fax.  ?

## 2021-08-24 ENCOUNTER — Encounter: Payer: Self-pay | Admitting: Internal Medicine

## 2021-08-24 DIAGNOSIS — E785 Hyperlipidemia, unspecified: Secondary | ICD-10-CM | POA: Diagnosis not present

## 2021-08-24 DIAGNOSIS — M5117 Intervertebral disc disorders with radiculopathy, lumbosacral region: Secondary | ICD-10-CM | POA: Diagnosis not present

## 2021-08-24 DIAGNOSIS — M4807 Spinal stenosis, lumbosacral region: Secondary | ICD-10-CM | POA: Diagnosis not present

## 2021-08-24 DIAGNOSIS — M4646 Discitis, unspecified, lumbar region: Secondary | ICD-10-CM | POA: Diagnosis not present

## 2021-08-24 DIAGNOSIS — M810 Age-related osteoporosis without current pathological fracture: Secondary | ICD-10-CM | POA: Diagnosis not present

## 2021-08-24 DIAGNOSIS — H409 Unspecified glaucoma: Secondary | ICD-10-CM | POA: Diagnosis not present

## 2021-08-24 DIAGNOSIS — M4627 Osteomyelitis of vertebra, lumbosacral region: Secondary | ICD-10-CM | POA: Diagnosis not present

## 2021-08-24 DIAGNOSIS — I129 Hypertensive chronic kidney disease with stage 1 through stage 4 chronic kidney disease, or unspecified chronic kidney disease: Secondary | ICD-10-CM | POA: Diagnosis not present

## 2021-08-24 DIAGNOSIS — K589 Irritable bowel syndrome without diarrhea: Secondary | ICD-10-CM | POA: Diagnosis not present

## 2021-08-24 DIAGNOSIS — M4316 Spondylolisthesis, lumbar region: Secondary | ICD-10-CM | POA: Diagnosis not present

## 2021-08-24 DIAGNOSIS — F32A Depression, unspecified: Secondary | ICD-10-CM | POA: Diagnosis not present

## 2021-08-24 DIAGNOSIS — K5790 Diverticulosis of intestine, part unspecified, without perforation or abscess without bleeding: Secondary | ICD-10-CM | POA: Diagnosis not present

## 2021-08-24 DIAGNOSIS — I4891 Unspecified atrial fibrillation: Secondary | ICD-10-CM | POA: Diagnosis not present

## 2021-08-24 DIAGNOSIS — B952 Enterococcus as the cause of diseases classified elsewhere: Secondary | ICD-10-CM | POA: Diagnosis not present

## 2021-08-24 DIAGNOSIS — K449 Diaphragmatic hernia without obstruction or gangrene: Secondary | ICD-10-CM | POA: Diagnosis not present

## 2021-08-24 DIAGNOSIS — F419 Anxiety disorder, unspecified: Secondary | ICD-10-CM | POA: Diagnosis not present

## 2021-08-24 DIAGNOSIS — B964 Proteus (mirabilis) (morganii) as the cause of diseases classified elsewhere: Secondary | ICD-10-CM | POA: Diagnosis not present

## 2021-08-24 DIAGNOSIS — I350 Nonrheumatic aortic (valve) stenosis: Secondary | ICD-10-CM | POA: Diagnosis not present

## 2021-08-24 DIAGNOSIS — N1831 Chronic kidney disease, stage 3a: Secondary | ICD-10-CM | POA: Diagnosis not present

## 2021-08-24 DIAGNOSIS — M5116 Intervertebral disc disorders with radiculopathy, lumbar region: Secondary | ICD-10-CM | POA: Diagnosis not present

## 2021-08-24 DIAGNOSIS — K219 Gastro-esophageal reflux disease without esophagitis: Secondary | ICD-10-CM | POA: Diagnosis not present

## 2021-08-24 DIAGNOSIS — M5114 Intervertebral disc disorders with radiculopathy, thoracic region: Secondary | ICD-10-CM | POA: Diagnosis not present

## 2021-08-24 DIAGNOSIS — E039 Hypothyroidism, unspecified: Secondary | ICD-10-CM | POA: Diagnosis not present

## 2021-08-24 DIAGNOSIS — M4647 Discitis, unspecified, lumbosacral region: Secondary | ICD-10-CM | POA: Diagnosis not present

## 2021-08-24 DIAGNOSIS — M47817 Spondylosis without myelopathy or radiculopathy, lumbosacral region: Secondary | ICD-10-CM | POA: Diagnosis not present

## 2021-08-25 NOTE — Telephone Encounter (Addendum)
What is the reason for denial?  ?

## 2021-08-26 ENCOUNTER — Ambulatory Visit (HOSPITAL_COMMUNITY)
Admission: RE | Admit: 2021-08-26 | Discharge: 2021-08-26 | Disposition: A | Discharge status: Home / Self Care | Payer: PPO | Source: Ambulatory Visit | Attending: Internal Medicine | Admitting: Internal Medicine

## 2021-08-26 ENCOUNTER — Other Ambulatory Visit: Payer: Self-pay

## 2021-08-26 ENCOUNTER — Encounter: Payer: Self-pay | Admitting: Internal Medicine

## 2021-08-26 ENCOUNTER — Ambulatory Visit: Payer: PPO | Admitting: Internal Medicine

## 2021-08-26 VITALS — BP 118/78 | HR 62 | Temp 98.1°F | Ht 61.0 in

## 2021-08-26 DIAGNOSIS — A0471 Enterocolitis due to Clostridium difficile, recurrent: Secondary | ICD-10-CM | POA: Diagnosis not present

## 2021-08-26 DIAGNOSIS — K611 Rectal abscess: Secondary | ICD-10-CM

## 2021-08-26 DIAGNOSIS — D721 Eosinophilia, unspecified: Secondary | ICD-10-CM | POA: Diagnosis not present

## 2021-08-26 DIAGNOSIS — M4647 Discitis, unspecified, lumbosacral region: Secondary | ICD-10-CM

## 2021-08-26 DIAGNOSIS — Z8719 Personal history of other diseases of the digestive system: Secondary | ICD-10-CM | POA: Diagnosis not present

## 2021-08-26 DIAGNOSIS — M4316 Spondylolisthesis, lumbar region: Secondary | ICD-10-CM | POA: Diagnosis not present

## 2021-08-26 DIAGNOSIS — G061 Intraspinal abscess and granuloma: Secondary | ICD-10-CM | POA: Diagnosis not present

## 2021-08-26 DIAGNOSIS — M4627 Osteomyelitis of vertebra, lumbosacral region: Secondary | ICD-10-CM | POA: Diagnosis not present

## 2021-08-26 MED ORDER — ONDANSETRON 4 MG PO TBDP: 4.0000 mg | ORAL_TABLET | Freq: Three times a day (TID) | ORAL | 0 refills | Status: DC | PRN

## 2021-08-26 MED ORDER — GADOBUTROL 1 MMOL/ML IV SOLN
7.0000 mL | Freq: Once | INTRAVENOUS | Status: AC | PRN
  Administered 2021-08-26: 7 mL via INTRAVENOUS

## 2021-08-26 NOTE — Progress Notes (Addendum)
?  ? ? ? ? ?[facility] for Infectious Disease ? ?CHIEF COMPLAINT:   ? ?Follow up for spinal infection, intra-abdominal abscess ? ?SUBJECTIVE:   ? ?Brooke Beasley is a 88 y.o. female with PMHx as below who presents to the clinic for hospital follow up regarding spinal infection and intra-abdominal abscess.  ? ?Patient here today for scheduled follow up.  She has had a complicated course recently that began shortly after a surgery in January 2023 for rectal prolapse.  Shortly afterwards she developed progressive low back pain as well as intra-abdominal fluid collections felt to be post operative fluid.  Her back pain progressed and she had an MRI as an outpatient showing discitis/OM at L5-S1.  MRI with contrast on 3/31 was notable for a 4 mm phlegmon without drainable component.  She continued to have signficant back pain and a repeat MRI was obtained 4/7 indicating phlegmon was about 27m and there was a small abscess measuring 1.2 x 0.4cm. She was seen by NSGY during her admission who declined surgical intervention.  She also was noted to have rim enhancing fluid collections posterior to the rectum measuring 6.2x2.4cm c/w seroma or abscess.  IR aspirated this collection on 4/10 yielding purulent fluid and cultures grew Proteus mirabilis and Citrobacter koseri.  The abscess cavity appeared completely decompressed after aspiration and no drain was placed. She was discharged on Zosyn via PICC line through 5/22.  She is also on Vancomycin PO for secondary C diff prevention. ? ?She was continuing to improve since discharge and her inflammatory markers on 4/24 indicated her ESR and CRP were trending down but now have increased again as of 5/1 with ESR 67, CRP 127, and WBC 12.2.  She is also feeling more poorly.  Reports weakness and nausea. She saw her PCP on 4/26 who decreased her gabapentin to BID from TID.  She was having itching at that time which has now improved as a result of this change.  Her OPAT  labs also show that her eosinophils have been elevated during this time.  On 4/30 she had a temperature of 99.4 then on 5/1 it was increased to 102.8.  Her OPAT labs from that date are also noted above.  OTher issues have been some urinary incontinence, congestion, and mucus production.  She had bleeding from her PICC line site on 4/19 prompting an ED visit where dressing was changed but otherwise has not had any issues with her PICC and no tenderness, redness at the site.  No concerning symptoms of diarrhea for recurrent C diff.  ? ?Please see A&P for the details of today's visit and status of the patient's medical problems.  ? ?Patient's Medications  ?New Prescriptions  ? ONDANSETRON (ZOFRAN-ODT) 4 MG DISINTEGRATING TABLET    Take 1 tablet (4 mg total) by mouth every 8 (eight) hours as needed for nausea or vomiting.  ?Previous Medications  ? ACETAMINOPHEN (TYLENOL) 500 MG TABLET    Take 500-1,000 mg by mouth every 6 (six) hours as needed for moderate pain. Depends on pain  ? ALBUTEROL (PROVENTIL HFA;VENTOLIN HFA) 108 (90 BASE) MCG/ACT INHALER    Inhale 2 puffs into the lungs every 6 (six) hours as needed for wheezing or shortness of breath.  ? ALPHAGAN P 0.1 % SOLN    Place 1 drop into both eyes daily.  ? APIXABAN (ELIQUIS) 5 MG TABS TABLET    Take 1 tablet (5 mg total) by mouth 2 (two) times daily.  ? BIMATOPROST (LUMIGAN)  0.01 % SOLN    Place 1 drop into both eyes at bedtime.  ? BIOFLAVONOID PRODUCTS (VITAMIN C) CHEW    Chew 1 tablet by mouth daily.  ? BISACODYL (DULCOLAX) 5 MG EC TABLET    Take 1 tablet (5 mg total) by mouth daily as needed for moderate constipation.  ? CHOLECALCIFEROL (VITAMIN D) 1000 UNITS TABLET    Take 1,000 Units by mouth daily.  ? CLONAZEPAM (KLONOPIN) 0.5 MG TABLET    Take 1 tablet (0.5 mg total) by mouth at bedtime.  ? COLCHICINE 0.6 MG TABLET    Take 1 tablet (0.6 mg total) by mouth daily as needed (gout flare).  ? FAMOTIDINE-CA CARB-MAG HYDROX (PEPCID COMPLETE PO)    Take 1 tablet by  mouth daily as needed (indigestion).  ? FERROUS SULFATE 325 (65 FE) MG TABLET    Take 1 tablet (325 mg total) by mouth 2 (two) times daily with a meal.  ? FUROSEMIDE (LASIX) 20 MG TABLET    Take 20 mg by mouth daily as needed for edema or fluid.  ? GABAPENTIN (NEURONTIN) 400 MG CAPSULE    Take 1 capsule (400 mg total) by mouth 3 (three) times daily.  ? HYDROCODONE-ACETAMINOPHEN (NORCO/VICODIN) 5-325 MG TABLET    Take 1 tablet 3 times a day (every 8 hours) for 2 days and then take 1 tablet every 4-6 hours as needed for severe pain.  ? HYDROCORTISONE 2.5 % CREAM    Place 1 application rectally daily as needed (hemorrhoids).  ? INFANT CARE PRODUCTS (DERMACLOUD) OINT    Apply 1 application topically 2 (two) times daily as needed.  ? IRON-FA-B CMP-C-BIOT-PROBIOTIC (FUSION PLUS) CAPS    Take 1 capsule by mouth once daily  ? LEVOTHYROXINE (SYNTHROID) 100 MCG TABLET    Take 1 tablet by mouth once daily  ? LOPERAMIDE (IMODIUM) 2 MG CAPSULE    Take 1 capsule (2 mg total) by mouth 3 (three) times daily as needed for diarrhea or loose stools.  ? METOPROLOL TARTRATE (LOPRESSOR) 50 MG TABLET    Take 1 tablet (50 mg total) by mouth 2 (two) times daily.  ? MISC NATURAL PRODUCTS (OSTEO BI-FLEX TRIPLE STRENGTH PO)    Take 1 tablet by mouth daily.  ? MULTIPLE VITAMIN (MULTIVITAMIN ADULT PO)    Take by mouth daily.  ? OMEPRAZOLE (PRILOSEC) 20 MG CAPSULE    Take 20 mg by mouth daily as needed (heartburn).  ? PIPERACILLIN-TAZOBACTAM (ZOSYN) 4.5 (4-0.5) G INJECTION    Inject into the vein.  ? POLYETHYLENE GLYCOL (MIRALAX / GLYCOLAX) 17 G PACKET    Take 17 g by mouth daily.  ? SERTRALINE (ZOLOFT) 100 MG TABLET    Take 1 tablet (100 mg total) by mouth in the morning.  ? SIMETHICONE 180 MG CAPS    Take 1 capsule by mouth daily as needed (indigestion).  ? SPIRONOLACTONE (ALDACTONE) 25 MG TABLET    Take 1 tablet (25 mg total) by mouth daily.  ? VANCOMYCIN (VANCOCIN) 125 MG CAPSULE    Take 1 capsule (125 mg total) by mouth 2 (two) times daily.   ? VOLTAREN 1 % GEL    APPLY ONE APPLICATION TOPICALLY 3 TIMES DAILY  ? WHEAT DEXTRIN (BENEFIBER DRINK MIX PO)    Take 15 mLs by mouth daily.  ?Modified Medications  ? No medications on file  ?Discontinued Medications  ? ONDANSETRON (ZOFRAN) 4 MG TABLET    Take 1 tablet by mouth every 8 hours as needed for nausea or  refractory nausea / vomiting.  ?   ? ?Past Medical History:  ?Diagnosis Date  ? Anxiety   ? C. difficile diarrhea 07/11/2018  ? S/p multiple oral vanc treatments (course and tapers) and completed Zinplava monoclonal Ab treatment (05/10/2019) Fecal transplant on hold during [place] pandemic  ? CAP (community acquired pneumonia) 12/22/2017  ? Carotid stenosis 10/18/2014  ? R 1-39%, L 40-59%, rpt 1 yr (09/2014)   ? CKD (chronic kidney disease) stage 3, GFR 30-59 ml/min (HCC) 08/02/2014  ? Closed fracture of right orbit ([place]) 02/10/2021  ? Clostridioides difficile infection   ? hx 2020  ? COVID-19 virus infection 10/13/2020  ? Degenerative disc disease   ? LS  ? Depression   ? nervous breakdonw in 1964-out of work for a year  ? Diverticulosis   ? severe by colonoscopy  ? Family history of adverse reaction to anesthesia   ? n/v  ? GERD (gastroesophageal reflux disease)   ? Glaucoma   ? Heart murmur 08/2011  ? mitral regurge - on echo   ? History of hiatal hernia   ? History of shingles   ? HTN (hypertension)   ? Hyperlipidemia   ? Hypertension   ? Hypothyroidism   ? IBS 11/02/2006  ? Intestinal bacterial overgrowth   ? In small colon  ? Left shoulder pain 11/04/2014  ? Lower GI bleed 12/2020  ? thought diverticular complicated by ABLA with syncope and orbital fracture s/p hospitalization  ? Maxillary fracture ([place]) 04/08/2012  ? Osteoarthritis 2016  ? Jefm [place])  ? Osteoporosis   ? dexa 2011  ? Pseudogout 2016  ? shoulders ([place])  ? ? ?Social History  ? ?Tobacco Use  ? Smoking status: Never  ? Smokeless tobacco: Never  ?Vaping Use  ? Vaping Use: Never used  ?Substance Use Topics  ? Alcohol use: No  ? Drug use: No   ? ? ?Family History  ?Problem Relation Age of Onset  ? Diabetes Brother   ?     post-op  ? Coronary artery disease Brother   ? Diabetes Paternal Grandfather   ? Breast cancer Cousin   ? ? ?Allergies  ?

## 2021-08-26 NOTE — Patient Instructions (Signed)
Thank you for coming to see me today. It was a pleasure seeing you. ? ?To Do: ?Labs today and get imaging done ?I sent in a new prescription for Zofran ?Please let us know if any more fevers ?Try continuing to decrease the Gabapentin back to previous dose ? ?If you have any questions or concerns, please do not hesitate to call the office at [phone]. ? ?Take Care,  ? ?Jule Ser ? ?

## 2021-08-26 NOTE — Assessment & Plan Note (Signed)
Continue Vancomycin '125mg'$  PO BID for prevention while on other systemic antibiotics and for 1 week after. ?

## 2021-08-26 NOTE — Assessment & Plan Note (Signed)
Eosinophilia of 800 noted on labs from 5/1.  Previously 600 the week prior.  She reports itching has improved since decreasing her Gabapentin dose.  Eosiniophilia associated with gabapentin is unusual and she has no other rash or dermatologic manifestations to suggest DRESS.  Zosyn could also be contributing to her eosinophils.  Will repeat CBC with Diff and inflammatory markers today to assess the trend.   ?

## 2021-08-26 NOTE — Assessment & Plan Note (Signed)
Continue Zosyn via PICC line continuous infusion.  Given her worsening WBC and inflammatory markers this week, will get repeat imaging of her spine and pelvis to ensure stability and no new concerns.  Will make decision on antibiotics pending imaging findings but I am concerned regarding her fevers and worsening inflammatory markers.  ?

## 2021-08-26 NOTE — Assessment & Plan Note (Signed)
Status post aspiration with IR and cultures grew Proteus mirabilis and Citrobacter koseri.  Covered by Zosyn through 5/22.  Will get updated imaging as noted above.  ?

## 2021-08-27 ENCOUNTER — Telehealth: Payer: Self-pay

## 2021-08-27 ENCOUNTER — Telehealth: Payer: Self-pay | Admitting: Internal Medicine

## 2021-08-27 MED ORDER — AMOXICILLIN-POT CLAVULANATE 875-125 MG PO TABS: 1.0000 | ORAL_TABLET | Freq: Two times a day (BID) | ORAL | 0 refills | Status: DC

## 2021-08-27 NOTE — Telephone Encounter (Signed)
Per Dr. Juleen China, PICC line should be removed as soon as possible. Sent order for removal to Carolynn Sayers, RN with [facility]. Notified [facility] pharmacy team of new plan to remove PICC and switch patient to orals.  ? ?Beryle Flock, RN ? ?

## 2021-08-27 NOTE — Telephone Encounter (Signed)
I called patient and discussed the following: ? ?- her repeat MRI was reassuring.  There was no progression of discitis/osteomyelitis in her spine.  The changes seen on imaging that were persistent are expected with this type of infection as the imaging can take much longer to resolve before a clinical improvement.  There was no epidural abscess which was the primary reason for obtaining the repeat MRI.   ?- the abscess posterior to the rectum that interventional radiology aspirated is resolved. ?- her white blood cell count is still elevated and a little higher than 5/1.   Her eosinophils have normalized from the past 2 weeks when they were slightly increased which probably explains why her itching has improved.  ?- additionally, her inflammatory markers are also increased from 5/1. ?- she is not having any diarrhea. ?- without another clear source of new infection, one concern is that it could be related to her PICC line.  I would recommend we have this removed and transition to an oral antibiotic to finish the rest of her antibiotic therapy.  I think this is very reasonable to do at this point given the improved MRI findings and since she has received several weeks of IV therapy.  I recommend Augmentin 875/'125mg'$  twice daily. ?- they are agreeable and we will have [place] remove PICC line and send in oral antibiotic to her pharmacy.  She will continue with vanco PO for C diff prophylaxis ? ? ?Brooke Beasley ?[place] Disease ?[facility] ?08/27/2021, 10:27 AM ? ?

## 2021-08-28 DIAGNOSIS — M4646 Discitis, unspecified, lumbar region: Secondary | ICD-10-CM | POA: Diagnosis not present

## 2021-08-28 HISTORY — PX: PICC LINE REMOVAL (ARMC HX): HXRAD1261

## 2021-08-31 ENCOUNTER — Telehealth: Payer: Self-pay | Admitting: Cardiology

## 2021-08-31 NOTE — Telephone Encounter (Signed)
Pt c/o BP issue: STAT if pt c/o blurred vision, one-sided weakness or slurred speech ? ?1. What are your last 5 BP readings? This morning BP was 190/127 HR 74 temperature 95.8 ? ?2. Are you having any other symptoms (ex. Dizziness, headache, blurred vision, passed out)? headache ? ?3. What is your BP issue? Patient's daughter states the patient's BP this morning and last night has been very high. She says she is not sure if her medications need to be adjusted. She says Friday the patient had a PICC line removed. ?

## 2021-08-31 NOTE — Progress Notes (Signed)
? ?Cardiology Office Note   ? ?Date:  09/01/2021  ? ?ID:  Brooke Beasley, DOB Oct 18, 1930, MRN [id number] ? ?PCP:  [name], MD  ?Cardiologist:  Kate Sable, MD  ?Electrophysiologist:  None  ? ?Chief Complaint: Elevated BP ? ?History of Present Illness:  ? ?Brooke Beasley is a 88 y.o. female with history of recently diagnosed A-fib in 06/2021 as outlined below on apixaban, diastolic dysfunction, mild to moderate aortic stenosis, syncope felt to be due to acute blood loss anemia following presumed diverticular bleed, HTN, HLD, hypothyroidism, rectal prolapse status post rectosigmoid resection and surgical repair in 04/2021, and recurrent C. difficile who presents for evaluation of elevated BP. ? ?She was admitted to the hospital in late 06/2021 with discitis/osteomyelitis/abscess status post aspiration x2 that grew Enterococcus, Proteus mirabilis, and Citrobacter.  During admission, she was noted to have new onset A-fib.  2D echo showed an EF of 65 to 70%, no regional wall motion abnormalities, moderate concentric LVH, mildly reduced RV systolic function with normal ventricular cavity size, normal PASP, mild mitral regurgitation, trivial aortic insufficiency, mild aortic stenosis, and an estimated right atrial pressure of 3 mmHg.  TEE was negative for evidence of valvular vegetation with an EF of 55 to 60%, normal RV systolic function and ventricular cavity size, moderately dilated left atrium without evidence of left atrial or left atrial appendage thrombus, mildly dilated right atrium, moderate mitral valve regurgitation with moderate prolapse of the lateral scallop of the posterior leaflet, tricuspid aortic valve with trivial insufficiency and mild to moderate stenosis. ? ?She was most recently seen in the office on 08/17/2021 for hospital follow-up of the above.  It was noted apixaban had been stopped due to bleeding from her PICC line.  She remained in rate controlled A-fib at that visit.   With subsequent labs showing stable hemoglobin, apixaban was resumed following that office visit. ? ?She contacted our office on 08/31/2021 noting a BP of 190/127.  Appointment was made for today.  Upon reviewing MyChart message, home BP readings average 170/105 ? ?She comes in accompanied by her daughter today and reports she generally does not feel well.  Her PICC line has been removed secondary to a Tmax of 102 ?F.  The patient continues to note cold intolerance.  She also reports ongoing back pain, knee pain, and ankle pain.  She has left sided pruritus.  She has felt like her blood pressure has been on the high side with associated headache.  Home BP readings have been elevated outside of 1 well-controlled reading yesterday morning.  There have been no significant dietary changes.  Typically, her elevated BP readings are in the early afternoon hours.  She remains adherent to apixaban, metoprolol, and spironolactone.  No symptoms concerning for angina or decompensation.  She has follow-up with ID later this week. ? ? ?With regards to antihypertensive medications, lisinopril was discontinued due to cough, losartan discontinued due to worsening renal function, amlodipine stopped due to abdominal pain, and carvedilol was previously stopped due to fatigue. ? ? ? ? ?Labs independently reviewed: ?08/2021 - Hgb 12.0, PLT 293, BUN 14, serum creatinine 1.07, potassium 4.3, albumin 3.4, AST/ALT normal ?07/2021 - magnesium 2.4, free T4 normal, TSH 6.663 ?04/2021 - A1c 5.6 ?03/2021 - TC 179, TG 69, HDL 71, LDL 95 ? ?Past Medical History:  ?Diagnosis Date  ? Anxiety   ? C. difficile diarrhea 07/11/2018  ? S/p multiple oral vanc treatments (course and tapers) and completed Zinplava monoclonal Ab treatment (  05/10/2019) Fecal transplant on hold during [place] pandemic  ? CAP (community acquired pneumonia) 12/22/2017  ? Carotid stenosis 10/18/2014  ? R 1-39%, L 40-59%, rpt 1 yr (09/2014)   ? CKD (chronic kidney disease) stage 3, GFR  30-59 ml/min (HCC) 08/02/2014  ? Closed fracture of right orbit ([place]) 02/10/2021  ? Clostridioides difficile infection   ? hx 2020  ? COVID-19 virus infection 10/13/2020  ? Degenerative disc disease   ? LS  ? Depression   ? nervous breakdonw in 1964-out of work for a year  ? Diverticulosis   ? severe by colonoscopy  ? Family history of adverse reaction to anesthesia   ? n/v  ? GERD (gastroesophageal reflux disease)   ? Glaucoma   ? Heart murmur 08/2011  ? mitral regurge - on echo   ? History of hiatal hernia   ? History of shingles   ? HTN (hypertension)   ? Hyperlipidemia   ? Hypertension   ? Hypothyroidism   ? IBS 11/02/2006  ? Intestinal bacterial overgrowth   ? In small colon  ? Left shoulder pain 11/04/2014  ? Lower GI bleed 12/2020  ? thought diverticular complicated by ABLA with syncope and orbital fracture s/p hospitalization  ? Maxillary fracture ([place]) 04/08/2012  ? Osteoarthritis 2016  ? Jefm Bryant)  ? Osteoporosis   ? dexa 2011  ? Pseudogout 2016  ? shoulders ([place])  ? ? ?Past Surgical History:  ?Procedure Laterality Date  ? barium enema  2012  ? severe diverticulosis, redundant colon  ? Bowel obstruction  1999  ? no surgery in hosp x 3 days  ? BUBBLE STUDY  07/27/2021  ? Procedure: BUBBLE STUDY;  Surgeon: Dixie Dials, MD;  Location: [place];  Service: Cardiovascular;;  ? COLONOSCOPY  1. 1999  2. 11/04  ? 1. Not finished  2. Slight hemorrhage rectosigmoid area, severe sig diverticulosis  ? COLONOSCOPY WITH PROPOFOL N/A 09/10/2019  ? SSP with dysplasia, TA, rpt 3 yrs (Vanga, Tally Due, MD)  ? Dexa  1. 2694-8546   2. 9/04  3. 3/08   ? 1. OP  2. OP, borderline, spine -2.44T  3. decreased BMD-OP  ? DG KNEE 1-2 VIEWS BILAT    ? LS x-ray with degenerative disc and facet change  ? ESOPHAGOGASTRODUODENOSCOPY    ? Negative  ? FLEXIBLE SIGMOIDOSCOPY N/A 01/16/2021  ? Procedure: FLEXIBLE SIGMOIDOSCOPY;  Surgeon: Gatha Mayer, MD;  Location: [facility] ENDOSCOPY;  Service: Endoscopy;  Laterality: N/A;  or  unsedated  ? Hemorrhoid procedure  07/2006  ? PICC LINE REMOVAL ([place] HX)  08/28/2021  ? PROCTOSCOPY N/A 05/26/2021  ? Procedure: RIGID PROCTOSCOPY;  Surgeon: Michael Boston, MD;  Location: [facility] ORS;  Service: General;  Laterality: N/A;  ? RECTOPEXY N/A 05/26/2021  ? Procedure: RECTOPEXY;  Surgeon: Michael Boston, MD;  Location: [facility] ORS;  Service: General;  Laterality: N/A;  ? TEE WITHOUT CARDIOVERSION N/A 07/27/2021  ? Procedure: TRANSESOPHAGEAL ECHOCARDIOGRAM (TEE);  Surgeon: Dixie Dials, MD;  Location: [place];  Service: Cardiovascular;  Laterality: N/A;  ? TONSILLECTOMY    ? TOTAL SHOULDER ARTHROPLASTY Left 11/27/2015  ? Corky Mull, MD  ? TOTAL SHOULDER REVISION Left 03/16/2016  ? Procedure: TOTAL SHOULDER REVISION;  Surgeon: Corky Mull, MD;  Location: [facility] ORS;  Service: Orthopedics;  Laterality: Left;  ? US ECHOCARDIOGRAPHY  07/2011  ? Normal systolic fxn with EF 27-03%.  Focal basal septal hypertrophy.  Mild diastolic dysfunction.  Mild MR.  ? XI ROBOTIC ASSISTED LOWER  ANTERIOR RESECTION N/A 05/26/2021  ? Procedure: ROBOTIC LOW ANTERIOR RECTOSIGMOID RESECTION; ASSESSMENT OF TISSUE PERFUSION WITH FIREFLY;  Surgeon: Michael Boston, MD;  Location: [facility];  Service: General;  Laterality: N/A;  ? ? ?Current Medications: ?Current Meds  ?Medication Sig  ? acetaminophen (TYLENOL) 500 MG tablet Take 500-1,000 mg by mouth every 6 (six) hours as needed for moderate pain. Depends on pain  ? albuterol (PROVENTIL HFA;VENTOLIN HFA) 108 (90 Base) MCG/ACT inhaler Inhale 2 puffs into the lungs every 6 (six) hours as needed for wheezing or shortness of breath.  ? ALPHAGAN P 0.1 % SOLN Place 1 drop into both eyes daily.  ? amoxicillin-clavulanate (AUGMENTIN) 875-125 MG tablet Take 1 tablet by mouth 2 (two) times daily for 18 days.  ? apixaban (ELIQUIS) 5 MG TABS tablet Take 1 tablet (5 mg total) by mouth 2 (two) times daily.  ? bimatoprost (LUMIGAN) 0.01 % SOLN Place 1 drop into both eyes at bedtime.  ? bisacodyl (DULCOLAX)  5 MG EC tablet Take 1 tablet (5 mg total) by mouth daily as needed for moderate constipation.  ? chlorthalidone (HYGROTON) 25 MG tablet Take 1 tablet (25 mg total) by mouth as directed. Take daily at [facility]  ? c

## 2021-08-31 NOTE — Telephone Encounter (Signed)
Called and spoke with patients daughter. I was able to offer them an appointment tomorrow morning with Christell Faith. Daughter states that her PiCC line site tooks normal. Denies swelling, redness, or oozing at the site. Patient is afebrile. Patient has taken her Lopressor 50 MG already this morning. I advised that she take an extra 50 MG now. I also advised that if her Systolic is over 193 tonight before she takes her Lopressor to take an extra dose again. ? ?Patients daughter was grateful for the call back, verbalized understanding, and confirmed that they will be here for the appointment tomorrow morning with Christell Faith. ?

## 2021-09-01 ENCOUNTER — Encounter: Payer: Self-pay | Admitting: Physician Assistant

## 2021-09-01 ENCOUNTER — Ambulatory Visit: Payer: PPO | Admitting: Physician Assistant

## 2021-09-01 ENCOUNTER — Encounter: Payer: Self-pay | Admitting: Cardiology

## 2021-09-01 VITALS — BP 134/90 | HR 53 | Ht 61.0 in | Wt 151.0 lb

## 2021-09-01 DIAGNOSIS — Z87898 Personal history of other specified conditions: Secondary | ICD-10-CM | POA: Diagnosis not present

## 2021-09-01 DIAGNOSIS — I10 Essential (primary) hypertension: Secondary | ICD-10-CM

## 2021-09-01 DIAGNOSIS — I350 Nonrheumatic aortic (valve) stenosis: Secondary | ICD-10-CM

## 2021-09-01 DIAGNOSIS — I4819 Other persistent atrial fibrillation: Secondary | ICD-10-CM | POA: Diagnosis not present

## 2021-09-01 DIAGNOSIS — Z79899 Other long term (current) drug therapy: Secondary | ICD-10-CM | POA: Diagnosis not present

## 2021-09-01 DIAGNOSIS — I5189 Other ill-defined heart diseases: Secondary | ICD-10-CM | POA: Diagnosis not present

## 2021-09-01 LAB — CBC WITH DIFFERENTIAL/PLATELET
Absolute Monocytes: 1714 cells/uL — ABNORMAL HIGH (ref 200–950)
Basophils Absolute: 54 cells/uL (ref 0–200)
Basophils Relative: 0.4 %
Eosinophils Absolute: 204 cells/uL (ref 15–500)
Eosinophils Relative: 1.5 %
HCT: 38.1 % (ref 35.0–45.0)
Hemoglobin: 12 g/dL (ref 11.7–15.5)
Lymphs Abs: 2788 cells/uL (ref 850–3900)
MCH: 28.6 pg (ref 27.0–33.0)
MCHC: 31.5 g/dL — ABNORMAL LOW (ref 32.0–36.0)
MCV: 90.7 fL (ref 80.0–100.0)
MPV: 9.8 fL (ref 7.5–12.5)
Monocytes Relative: 12.6 %
Neutro Abs: 8840 cells/uL — ABNORMAL HIGH (ref 1500–7800)
Neutrophils Relative %: 65 %
Platelets: 293 10*3/uL (ref 140–400)
RBC: 4.2 10*6/uL (ref 3.80–5.10)
RDW: 16.7 % — ABNORMAL HIGH (ref 11.0–15.0)
Total Lymphocyte: 20.5 %
WBC: 13.6 10*3/uL — ABNORMAL HIGH (ref 3.8–10.8)

## 2021-09-01 LAB — CULTURE, BLOOD (SINGLE)
MICRO NUMBER:: 13346932
MICRO NUMBER:: 13346931
Result:: NO GROWTH
SPECIMEN QUALITY:: ADEQUATE

## 2021-09-01 LAB — COMPLETE METABOLIC PANEL WITH GFR
AG Ratio: 1 (calc) (ref 1.0–2.5)
ALT: 13 U/L (ref 6–29)
AST: 20 U/L (ref 10–35)
Albumin: 3.4 g/dL — ABNORMAL LOW (ref 3.6–5.1)
Alkaline phosphatase (APISO): 84 U/L (ref 37–153)
BUN/Creatinine Ratio: 13 (calc) (ref 6–22)
BUN: 14 mg/dL (ref 7–25)
CO2: 25 mmol/L (ref 20–32)
Calcium: 9 mg/dL (ref 8.6–10.4)
Chloride: 93 mmol/L — ABNORMAL LOW (ref 98–110)
Creat: 1.07 mg/dL — ABNORMAL HIGH (ref 0.60–0.95)
Globulin: 3.3 g/dL (calc) (ref 1.9–3.7)
Glucose, Bld: 92 mg/dL (ref 65–99)
Potassium: 4.3 mmol/L (ref 3.5–5.3)
Sodium: 129 mmol/L — ABNORMAL LOW (ref 135–146)
Total Bilirubin: 0.8 mg/dL (ref 0.2–1.2)
Total Protein: 6.7 g/dL (ref 6.1–8.1)
eGFR: 50 mL/min/{1.73_m2} — ABNORMAL LOW (ref >=60)

## 2021-09-01 LAB — C-REACTIVE PROTEIN: CRP: 209.7 mg/L — ABNORMAL HIGH (ref <8.0)

## 2021-09-01 LAB — SEDIMENTATION RATE: Sed Rate: 14 mm/h (ref 0–30)

## 2021-09-01 MED ORDER — HYDRALAZINE HCL 25 MG PO TABS: 25.0000 mg | ORAL_TABLET | Freq: Three times a day (TID) | ORAL | 3 refills | Status: DC | PRN

## 2021-09-01 MED ORDER — CHLORTHALIDONE 25 MG PO TABS: 25.0000 mg | ORAL_TABLET | ORAL | 3 refills | Status: DC

## 2021-09-01 NOTE — Telephone Encounter (Signed)
Spoke with [facility] pharmacist, patient picked up Zofran ODT RX on 08/27/21. RX for Zofran in April was denied for regular tablets. ?Nothing further is needed fyi to PCP ?

## 2021-09-01 NOTE — Patient Instructions (Signed)
Medication Instructions:  ?Your physician has recommended you make the following change in your medication:  ? ?START Chlorthalidone 25 mg daily at [facility]. ?AS NEEDED Hydralazine 25 mg three times a day for top blood pressure reading greater than 160.   ? ?*If you need a refill on your cardiac medications before your next appointment, please call your pharmacy* ? ? ?Lab Work: ?BMET in one week. Go to [facility] entrance and check in at registration to have that done.  ? ?If you have labs (blood work) drawn today and your tests are completely normal, you will receive your results only by: ?MyChart Message (if you have MyChart) OR ?A paper copy in the mail ?If you have any lab test that is abnormal or we need to change your treatment, we will call you to review the results. ? ? ?Testing/Procedures: ?Your physician has requested that you have a renal artery duplex. During this test, an ultrasound is used to evaluate blood flow to the kidneys. Allow one hour for this exam. Do not eat after midnight the day before and avoid carbonated beverages. Take your medications as you usually do. ? ? ? ?Follow-Up: ?At [facility], you and your health needs are our priority.  As part of our continuing mission to provide you with exceptional heart care, we have created designated Provider Care Teams.  These Care Teams include your primary Cardiologist (physician) and Advanced Practice Providers (APPs -  Physician Assistants and Nurse Practitioners) who all work together to provide you with the care you need, when you need it. ? ? ?Your next appointment:   ?1 month(s) ? ?The format for your next appointment:   ?In Person ? ?Provider:   ?Kate Sable, MD or Christell Faith, PA-C  ? ? ?Important Information About Sugar ? ? ? ? ?  ?

## 2021-09-03 ENCOUNTER — Encounter: Payer: Self-pay | Admitting: Internal Medicine

## 2021-09-03 ENCOUNTER — Other Ambulatory Visit: Payer: Self-pay

## 2021-09-03 ENCOUNTER — Ambulatory Visit: Payer: PPO | Admitting: Internal Medicine

## 2021-09-03 VITALS — BP 152/82 | HR 63 | Temp 98.0°F

## 2021-09-03 DIAGNOSIS — R509 Fever, unspecified: Secondary | ICD-10-CM | POA: Insufficient documentation

## 2021-09-03 DIAGNOSIS — A0471 Enterocolitis due to Clostridium difficile, recurrent: Secondary | ICD-10-CM

## 2021-09-03 DIAGNOSIS — M4647 Discitis, unspecified, lumbosacral region: Secondary | ICD-10-CM

## 2021-09-03 DIAGNOSIS — L299 Pruritus, unspecified: Secondary | ICD-10-CM

## 2021-09-03 DIAGNOSIS — K611 Rectal abscess: Secondary | ICD-10-CM | POA: Diagnosis not present

## 2021-09-03 NOTE — Assessment & Plan Note (Signed)
Continue vancomycin PPX through 5/29 ?

## 2021-09-03 NOTE — Assessment & Plan Note (Signed)
Will continue with Augmentin '875mg'$  BID through 5/22 to complete 6 weeks of therapy from aspiration of her perirectal abscess.  MRI last week was reassuring that there was no complication of epidural abscess and just showed expected radiographic lag associated with known discitis/OM.  No need for further imaging at this time.  She continues to have significant right sided back pain radiating to her leg and foot. Suspect this to be less likely related to her infection as MRI also noted severe right neural foraminal stenosis which I think may be contributing as well.  Hopefully this will slowly improve with time.  ?

## 2021-09-03 NOTE — Assessment & Plan Note (Signed)
This appears to have resolved following removal of PICC line and blood cultures were finalized as no growth.  ?

## 2021-09-03 NOTE — Patient Instructions (Signed)
Thank you for coming to see me today. It was a pleasure seeing you. ? ?To Do: ?Continue Augmentin by mouth twice daily through 5/22 ?Continue Vancomycin by mouth through 5/29 ?Let us know if any concerns for a yeast infection and I can order Diflucan ? ?If you have any questions or concerns, please do not hesitate to call the office at [phone]. ? ?Take Care,  ? ?Jule Ser, DO ? ?

## 2021-09-03 NOTE — Assessment & Plan Note (Signed)
This has resolved on last week's MRI.  She remains on Augmentin for her discitis/OM.  ?

## 2021-09-03 NOTE — Progress Notes (Signed)
?  ? ? ? ? ?[place] for Infectious Disease ? ?CHIEF COMPLAINT:   ? ?Follow up for intra-abdominal abscess and spinal infection ? ?SUBJECTIVE:   ? ?Brooke Beasley is a 88 y.o. female with PMHx as below who presents to the clinic for hospital follow up regarding spinal infection and intra-abdominal abscess.  ? ?Patient here today for scheduled follow up.  She was seen last week on 5/3.  Please see that note for more details regarding her recent complicated course.  ? ?She repeated an MRI on 5/3 showing expected radiographic lag of her discitis/OM.  Fortunately, no epidural or residual IAA was noted.  Overall, this was reassuring.  Her WBC and inflammatory markers remained high.  Due to concern that this could be related to her PICC line, this was removed on 5/5 and she was transitioned to Augmentin 875 BID for 18 days through 5/22 (6 weeks from date of her intra-abdominal abscess).  ? ?She saw her cardiology office a couple days ago due to issues regarding her blood pressure management.   ? ?Since PICC removal she reports resolution of her fevers.  She is tolerating Augmentin overall but does report consistent nausea.  She is also having itching on the left side of her upper body.  There is no rash.  ? ? ? ?Please see A&P for the details of today's visit and status of the patient's medical problems.  ? ?Patient's Medications  ?New Prescriptions  ? No medications on file  ?Previous Medications  ? ACETAMINOPHEN (TYLENOL) 500 MG TABLET    Take 500-1,000 mg by mouth every 6 (six) hours as needed for moderate pain. Depends on pain  ? ALBUTEROL (PROVENTIL HFA;VENTOLIN HFA) 108 (90 BASE) MCG/ACT INHALER    Inhale 2 puffs into the lungs every 6 (six) hours as needed for wheezing or shortness of breath.  ? ALPHAGAN P 0.1 % SOLN    Place 1 drop into both eyes daily.  ? AMOXICILLIN-CLAVULANATE (AUGMENTIN) 875-125 MG TABLET    Take 1 tablet by mouth 2 (two) times daily for 18 days.  ? APIXABAN (ELIQUIS) 5 MG TABS  TABLET    Take 1 tablet (5 mg total) by mouth 2 (two) times daily.  ? BIMATOPROST (LUMIGAN) 0.01 % SOLN    Place 1 drop into both eyes at bedtime.  ? BISACODYL (DULCOLAX) 5 MG EC TABLET    Take 1 tablet (5 mg total) by mouth daily as needed for moderate constipation.  ? CHLORTHALIDONE (HYGROTON) 25 MG TABLET    Take 1 tablet (25 mg total) by mouth as directed. Take daily at [facility]  ? CHOLECALCIFEROL (VITAMIN D) 1000 UNITS TABLET    Take 1,000 Units by mouth daily.  ? CLONAZEPAM (KLONOPIN) 0.5 MG TABLET    Take 1 tablet (0.5 mg total) by mouth at bedtime.  ? COLCHICINE 0.6 MG TABLET    Take 1 tablet (0.6 mg total) by mouth daily as needed (gout flare).  ? FAMOTIDINE-CA CARB-MAG HYDROX (PEPCID COMPLETE PO)    Take 1 tablet by mouth daily as needed (indigestion).  ? FERROUS SULFATE 325 (65 FE) MG TABLET    Take 1 tablet (325 mg total) by mouth 2 (two) times daily with a meal.  ? FUROSEMIDE (LASIX) 20 MG TABLET    Take 20 mg by mouth daily as needed for edema or fluid.  ? HYDRALAZINE (APRESOLINE) 25 MG TABLET    Take 1 tablet (25 mg total) by mouth 3 (three) times daily as  needed (As needed for top blood pressure number greater than 160).  ? HYDROCORTISONE 2.5 % CREAM    Place 1 application rectally daily as needed (hemorrhoids).  ? INFANT CARE PRODUCTS (DERMACLOUD) OINT    Apply 1 application topically 2 (two) times daily as needed.  ? IRON-FA-B CMP-C-BIOT-PROBIOTIC (FUSION PLUS) CAPS    Take 1 capsule by mouth once daily  ? LEVOTHYROXINE (SYNTHROID) 100 MCG TABLET    Take 1 tablet by mouth once daily  ? LOPERAMIDE (IMODIUM) 2 MG CAPSULE    Take 1 capsule (2 mg total) by mouth 3 (three) times daily as needed for diarrhea or loose stools.  ? METOPROLOL TARTRATE (LOPRESSOR) 50 MG TABLET    Take 1 tablet (50 mg total) by mouth 2 (two) times daily.  ? MISC NATURAL PRODUCTS (OSTEO BI-FLEX TRIPLE STRENGTH PO)    Take 1 tablet by mouth daily.  ? MULTIPLE VITAMIN (MULTIVITAMIN ADULT PO)    Take by mouth daily.  ? OMEPRAZOLE  (PRILOSEC) 20 MG CAPSULE    Take 20 mg by mouth daily as needed (heartburn).  ? ONDANSETRON (ZOFRAN-ODT) 4 MG DISINTEGRATING TABLET    Take 1 tablet (4 mg total) by mouth every 8 (eight) hours as needed for nausea or vomiting.  ? POLYETHYLENE GLYCOL (MIRALAX / GLYCOLAX) 17 G PACKET    Take 17 g by mouth daily.  ? SERTRALINE (ZOLOFT) 100 MG TABLET    Take 1 tablet (100 mg total) by mouth in the morning.  ? SIMETHICONE 180 MG CAPS    Take 1 capsule by mouth daily as needed (indigestion).  ? SPIRONOLACTONE (ALDACTONE) 25 MG TABLET    Take 1 tablet (25 mg total) by mouth daily.  ? VANCOMYCIN (VANCOCIN) 125 MG CAPSULE    Take 1 capsule (125 mg total) by mouth 2 (two) times daily.  ? VOLTAREN 1 % GEL    APPLY ONE APPLICATION TOPICALLY 3 TIMES DAILY  ? WHEAT DEXTRIN (BENEFIBER DRINK MIX PO)    Take 15 mLs by mouth daily.  ?Modified Medications  ? No medications on file  ?Discontinued Medications  ? No medications on file  ?   ? ?Past Medical History:  ?Diagnosis Date  ? Anxiety   ? C. difficile diarrhea 07/11/2018  ? S/p multiple oral vanc treatments (course and tapers) and completed Zinplava monoclonal Ab treatment (05/10/2019) Fecal transplant on hold during [place] pandemic  ? CAP (community acquired pneumonia) 12/22/2017  ? Carotid stenosis 10/18/2014  ? R 1-39%, L 40-59%, rpt 1 yr (09/2014)   ? CKD (chronic kidney disease) stage 3, GFR 30-59 ml/min (HCC) 08/02/2014  ? Closed fracture of right orbit ([place]) 02/10/2021  ? Clostridioides difficile infection   ? hx 2020  ? COVID-19 virus infection 10/13/2020  ? Degenerative disc disease   ? LS  ? Depression   ? nervous breakdonw in 1964-out of work for a year  ? Diverticulosis   ? severe by colonoscopy  ? Family history of adverse reaction to anesthesia   ? n/v  ? GERD (gastroesophageal reflux disease)   ? Glaucoma   ? Heart murmur 08/2011  ? mitral regurge - on echo   ? History of hiatal hernia   ? History of shingles   ? HTN (hypertension)   ? Hyperlipidemia   ? Hypertension    ? Hypothyroidism   ? IBS 11/02/2006  ? Intestinal bacterial overgrowth   ? In small colon  ? Left shoulder pain 11/04/2014  ? Lower GI bleed 12/2020  ?  thought diverticular complicated by ABLA with syncope and orbital fracture s/p hospitalization  ? Maxillary fracture ([place]) 04/08/2012  ? Osteoarthritis 2016  ? Jefm Bryant)  ? Osteoporosis   ? dexa 2011  ? Pseudogout 2016  ? shoulders ([place])  ? ? ?Social History  ? ?Tobacco Use  ? Smoking status: Never  ? Smokeless tobacco: Never  ?Vaping Use  ? Vaping Use: Never used  ?Substance Use Topics  ? Alcohol use: No  ? Drug use: No  ? ? ?Family History  ?Problem Relation Age of Onset  ? Diabetes Brother   ?     post-op  ? Coronary artery disease Brother   ? Diabetes Paternal Grandfather   ? Breast cancer Cousin   ? ? ?Allergies  ?Allergen Reactions  ? Amlodipine Other (See Comments)  ?  Stomach pains--"feel like my insides are on fire"  ? Ace Inhibitors Cough  ? Carvedilol Other (See Comments)  ?  fatigue  ? Losartan Other (See Comments)  ?  Cr bumped  ? Other Other (See Comments)  ?  No seeds or corn because of IBS  ? Paroxetine Hcl Other (See Comments)  ?  Not effective - felt ill on this medicine  ? Risedronate Sodium   ?  REACTION: joint pain  ? Simvastatin   ?  REACTION: the full 20 mg pill causes leg pain- can tol 10 mg  ? Toprol Xl [Metoprolol] Diarrhea  ?  Diarrhea and weakness  ? Alendronate Sodium Palpitations  ? Influenza Vaccines Rash and Other (See Comments)  ?  Led to mental breakdown in 19s ?  ? Silenor [Doxepin Hcl] Palpitations  ?  Hallucinations and racing heart  ? Sulfonamide Derivatives Rash  ? ? ?Review of Systems  ?All other systems reviewed and are negative. Except as noted above.  ? ? ?OBJECTIVE:   ? ?Vitals:  ? 09/03/21 1525  ?BP: (!) 152/82  ?Pulse: 63  ?Temp: 98 ?F (36.7 ?C)  ?TempSrc: Oral  ? ?There is no height or weight on file to calculate BMI. ? ?Physical Exam ?Constitutional:   ?   General: She is not in acute distress. ?   Appearance:  Normal appearance.  ?   Comments: She appears better today than last week.   ?HENT:  ?   Head: Normocephalic and atraumatic.  ?Eyes:  ?   Extraocular Movements: Extraocular movements intact.  ?   Conjunctiv

## 2021-09-03 NOTE — Assessment & Plan Note (Signed)
Itching may be due to Augmentin side effect as she was also experiencing this on Zosyn, however, also appears has dealt with pruritis as previously noted by her PCP.  Okay to continue Augmentin as no other issues noted other than the itching and no rash.  Recommended topical benadryl and/or hydrocortisone cream. ?

## 2021-09-06 ENCOUNTER — Other Ambulatory Visit: Payer: Self-pay

## 2021-09-06 ENCOUNTER — Inpatient Hospital Stay (HOSPITAL_BASED_OUTPATIENT_CLINIC_OR_DEPARTMENT_OTHER)
Admission: EM | Admit: 2021-09-06 | Discharge: 2021-09-17 | DRG: 644 | Disposition: A | Discharge status: Home Health | Payer: PPO | Attending: Internal Medicine | Admitting: Internal Medicine

## 2021-09-06 ENCOUNTER — Emergency Department (HOSPITAL_BASED_OUTPATIENT_CLINIC_OR_DEPARTMENT_OTHER): Payer: PPO

## 2021-09-06 ENCOUNTER — Encounter (HOSPITAL_BASED_OUTPATIENT_CLINIC_OR_DEPARTMENT_OTHER): Payer: Self-pay | Admitting: Obstetrics and Gynecology

## 2021-09-06 DIAGNOSIS — F32A Depression, unspecified: Secondary | ICD-10-CM | POA: Diagnosis not present

## 2021-09-06 DIAGNOSIS — E222 Syndrome of inappropriate secretion of antidiuretic hormone: Principal | ICD-10-CM | POA: Diagnosis present

## 2021-09-06 DIAGNOSIS — R110 Nausea: Secondary | ICD-10-CM | POA: Diagnosis not present

## 2021-09-06 DIAGNOSIS — I350 Nonrheumatic aortic (valve) stenosis: Secondary | ICD-10-CM | POA: Diagnosis not present

## 2021-09-06 DIAGNOSIS — R531 Weakness: Secondary | ICD-10-CM | POA: Diagnosis not present

## 2021-09-06 DIAGNOSIS — I509 Heart failure, unspecified: Secondary | ICD-10-CM | POA: Diagnosis not present

## 2021-09-06 DIAGNOSIS — I10 Essential (primary) hypertension: Secondary | ICD-10-CM | POA: Diagnosis present

## 2021-09-06 DIAGNOSIS — Z79899 Other long term (current) drug therapy: Secondary | ICD-10-CM | POA: Diagnosis not present

## 2021-09-06 DIAGNOSIS — N1832 Chronic kidney disease, stage 3b: Secondary | ICD-10-CM | POA: Diagnosis present

## 2021-09-06 DIAGNOSIS — Z66 Do not resuscitate: Secondary | ICD-10-CM

## 2021-09-06 DIAGNOSIS — Z79890 Hormone replacement therapy (postmenopausal): Secondary | ICD-10-CM | POA: Diagnosis not present

## 2021-09-06 DIAGNOSIS — K219 Gastro-esophageal reflux disease without esophagitis: Secondary | ICD-10-CM | POA: Diagnosis not present

## 2021-09-06 DIAGNOSIS — I4819 Other persistent atrial fibrillation: Secondary | ICD-10-CM | POA: Diagnosis present

## 2021-09-06 DIAGNOSIS — E039 Hypothyroidism, unspecified: Secondary | ICD-10-CM | POA: Diagnosis not present

## 2021-09-06 DIAGNOSIS — I5032 Chronic diastolic (congestive) heart failure: Secondary | ICD-10-CM | POA: Diagnosis not present

## 2021-09-06 DIAGNOSIS — F419 Anxiety disorder, unspecified: Secondary | ICD-10-CM | POA: Diagnosis not present

## 2021-09-06 DIAGNOSIS — Z888 Allergy status to other drugs, medicaments and biological substances status: Secondary | ICD-10-CM

## 2021-09-06 DIAGNOSIS — R112 Nausea with vomiting, unspecified: Secondary | ICD-10-CM | POA: Diagnosis not present

## 2021-09-06 DIAGNOSIS — N1831 Chronic kidney disease, stage 3a: Secondary | ICD-10-CM | POA: Diagnosis not present

## 2021-09-06 DIAGNOSIS — I340 Nonrheumatic mitral (valve) insufficiency: Secondary | ICD-10-CM | POA: Diagnosis present

## 2021-09-06 DIAGNOSIS — Z8616 Personal history of COVID-19: Secondary | ICD-10-CM

## 2021-09-06 DIAGNOSIS — E785 Hyperlipidemia, unspecified: Secondary | ICD-10-CM | POA: Diagnosis not present

## 2021-09-06 DIAGNOSIS — Z8249 Family history of ischemic heart disease and other diseases of the circulatory system: Secondary | ICD-10-CM

## 2021-09-06 DIAGNOSIS — E871 Hypo-osmolality and hyponatremia: Secondary | ICD-10-CM | POA: Diagnosis present

## 2021-09-06 DIAGNOSIS — I130 Hypertensive heart and chronic kidney disease with heart failure and stage 1 through stage 4 chronic kidney disease, or unspecified chronic kidney disease: Secondary | ICD-10-CM | POA: Diagnosis not present

## 2021-09-06 DIAGNOSIS — Z7901 Long term (current) use of anticoagulants: Secondary | ICD-10-CM

## 2021-09-06 DIAGNOSIS — G4730 Sleep apnea, unspecified: Secondary | ICD-10-CM | POA: Diagnosis present

## 2021-09-06 DIAGNOSIS — M4647 Discitis, unspecified, lumbosacral region: Secondary | ICD-10-CM | POA: Diagnosis not present

## 2021-09-06 DIAGNOSIS — I38 Endocarditis, valve unspecified: Secondary | ICD-10-CM | POA: Diagnosis not present

## 2021-09-06 DIAGNOSIS — K589 Irritable bowel syndrome without diarrhea: Secondary | ICD-10-CM | POA: Diagnosis present

## 2021-09-06 DIAGNOSIS — R69 Illness, unspecified: Secondary | ICD-10-CM

## 2021-09-06 DIAGNOSIS — E876 Hypokalemia: Secondary | ICD-10-CM | POA: Diagnosis not present

## 2021-09-06 DIAGNOSIS — Z96612 Presence of left artificial shoulder joint: Secondary | ICD-10-CM | POA: Diagnosis present

## 2021-09-06 DIAGNOSIS — Z833 Family history of diabetes mellitus: Secondary | ICD-10-CM | POA: Diagnosis not present

## 2021-09-06 DIAGNOSIS — Z882 Allergy status to sulfonamides status: Secondary | ICD-10-CM

## 2021-09-06 DIAGNOSIS — R0602 Shortness of breath: Secondary | ICD-10-CM | POA: Diagnosis not present

## 2021-09-06 DIAGNOSIS — G319 Degenerative disease of nervous system, unspecified: Secondary | ICD-10-CM | POA: Diagnosis not present

## 2021-09-06 DIAGNOSIS — R0902 Hypoxemia: Secondary | ICD-10-CM | POA: Diagnosis not present

## 2021-09-06 LAB — URINALYSIS, ROUTINE W REFLEX MICROSCOPIC
Bilirubin Urine: NEGATIVE
Glucose, UA: NEGATIVE mg/dL
Hgb urine dipstick: NEGATIVE
Ketones, ur: NEGATIVE mg/dL
Leukocytes,Ua: NEGATIVE
Nitrite: NEGATIVE
Protein, ur: NEGATIVE mg/dL
Specific Gravity, Urine: 1.011 (ref 1.005–1.030)
pH: 6.5 (ref 5.0–8.0)

## 2021-09-06 LAB — CBC
HCT: 43 % (ref 36.0–46.0)
Hemoglobin: 14.6 g/dL (ref 12.0–15.0)
MCH: 28.3 pg (ref 26.0–34.0)
MCHC: 34 g/dL (ref 30.0–36.0)
MCV: 83.5 fL (ref 80.0–100.0)
Platelets: 357 10*3/uL (ref 150–400)
RBC: 5.15 MIL/uL — ABNORMAL HIGH (ref 3.87–5.11)
RDW: 18.3 % — ABNORMAL HIGH (ref 11.5–15.5)
WBC: 11.7 10*3/uL — ABNORMAL HIGH (ref 4.0–10.5)
nRBC: 0 % (ref 0.0–0.2)

## 2021-09-06 LAB — BASIC METABOLIC PANEL
Anion gap: 12 (ref 5–15)
BUN: 14 mg/dL (ref 8–23)
CO2: 24 mmol/L (ref 22–32)
Calcium: 10 mg/dL (ref 8.9–10.3)
Chloride: 78 mmol/L — ABNORMAL LOW (ref 98–111)
Creatinine, Ser: 0.86 mg/dL (ref 0.44–1.00)
GFR, Estimated: >60 mL/min (ref >60)
Glucose, Bld: 119 mg/dL — ABNORMAL HIGH (ref 70–99)
Potassium: 3.6 mmol/L (ref 3.5–5.1)
Sodium: 114 mmol/L — CL (ref 135–145)

## 2021-09-06 LAB — TROPONIN I (HIGH SENSITIVITY)
Troponin I (High Sensitivity): 6 ng/L (ref <18)
Troponin I (High Sensitivity): 4 ng/L (ref <18)

## 2021-09-06 LAB — SODIUM: Sodium: 114 mmol/L — CL (ref 135–145)

## 2021-09-06 MED ORDER — SODIUM CHLORIDE 0.9 % IV SOLN
Freq: Once | INTRAVENOUS | Status: AC
Start: 2021-09-06 — End: 2021-09-07

## 2021-09-06 MED ORDER — SODIUM CHLORIDE 0.9 % IV BOLUS
250.0000 mL | Freq: Once | INTRAVENOUS | Status: AC
  Administered 2021-09-06: 250 mL via INTRAVENOUS

## 2021-09-06 MED ORDER — PIPERACILLIN-TAZOBACTAM 3.375 G IVPB
3.3750 g | Freq: Three times a day (TID) | INTRAVENOUS | Status: DC
  Administered 2021-09-06: 3.375 g via INTRAVENOUS
  Filled 2021-09-06 (×3): qty 50

## 2021-09-06 MED ORDER — VANCOMYCIN HCL 125 MG PO CAPS
125.0000 mg | ORAL_CAPSULE | Freq: Two times a day (BID) | ORAL | Status: DC
  Administered 2021-09-07 – 2021-09-17 (×21): 125 mg via ORAL
  Filled 2021-09-06 (×23): qty 1

## 2021-09-06 MED ORDER — APIXABAN 5 MG PO TABS
5.0000 mg | ORAL_TABLET | Freq: Two times a day (BID) | ORAL | Status: DC
Start: 2021-09-06 — End: 2021-09-17
  Administered 2021-09-07 – 2021-09-17 (×22): 5 mg via ORAL
  Filled 2021-09-06 (×7): qty 1
  Filled 2021-09-06: qty 2
  Filled 2021-09-06 (×14): qty 1

## 2021-09-06 MED ORDER — HYDRALAZINE HCL 20 MG/ML IJ SOLN
10.0000 mg | Freq: Once | INTRAMUSCULAR | Status: AC
  Administered 2021-09-06: 10 mg via INTRAVENOUS
  Filled 2021-09-06: qty 1

## 2021-09-06 MED ORDER — BISACODYL 5 MG PO TBEC
5.0000 mg | DELAYED_RELEASE_TABLET | Freq: Every day | ORAL | Status: DC | PRN
  Administered 2021-09-12: 5 mg via ORAL
  Filled 2021-09-06: qty 1

## 2021-09-06 MED ORDER — FERROUS SULFATE 325 (65 FE) MG PO TABS
325.0000 mg | ORAL_TABLET | Freq: Two times a day (BID) | ORAL | Status: DC
  Administered 2021-09-07 – 2021-09-17 (×21): 325 mg via ORAL
  Filled 2021-09-06 (×21): qty 1

## 2021-09-06 MED ORDER — CLONAZEPAM 0.5 MG PO TABS
0.5000 mg | ORAL_TABLET | Freq: Every day | ORAL | Status: DC
  Administered 2021-09-07 – 2021-09-16 (×10): 0.5 mg via ORAL
  Filled 2021-09-06 (×11): qty 1

## 2021-09-06 MED ORDER — ONDANSETRON HCL 4 MG/2ML IJ SOLN
4.0000 mg | Freq: Once | INTRAMUSCULAR | Status: AC
  Administered 2021-09-06: 4 mg via INTRAVENOUS
  Filled 2021-09-06: qty 2

## 2021-09-06 MED ORDER — LEVOTHYROXINE SODIUM 100 MCG PO TABS
100.0000 ug | ORAL_TABLET | Freq: Every day | ORAL | Status: DC
  Administered 2021-09-07: 100 ug via ORAL
  Filled 2021-09-06: qty 1

## 2021-09-06 MED ORDER — METOPROLOL TARTRATE 25 MG PO TABS
50.0000 mg | ORAL_TABLET | Freq: Two times a day (BID) | ORAL | Status: DC
  Administered 2021-09-07 (×2): 50 mg via ORAL
  Filled 2021-09-06: qty 2
  Filled 2021-09-06: qty 1

## 2021-09-06 MED ORDER — PANTOPRAZOLE SODIUM 40 MG PO TBEC
40.0000 mg | DELAYED_RELEASE_TABLET | Freq: Every day | ORAL | Status: DC
  Administered 2021-09-07: 40 mg via ORAL
  Filled 2021-09-06: qty 1

## 2021-09-06 MED ORDER — SERTRALINE HCL 100 MG PO TABS
100.0000 mg | ORAL_TABLET | Freq: Every morning | ORAL | Status: DC
Start: 2021-09-07 — End: 2021-09-07
  Administered 2021-09-07: 100 mg via ORAL
  Filled 2021-09-06: qty 1

## 2021-09-06 NOTE — ED Provider Notes (Addendum)
?[facility] DEPT ?Provider Note ? ? ?CSN: [id number] ?Arrival date & time: 09/06/21  1643 ? ?  ? ?History ? ?Chief Complaint  ?Patient presents with  ? Hypertension  ? ? ?Brooke Beasley is a 88 y.o. female. ? ?HPI ?Patient has a complex medical history.  Patient had a rectal prolapse repair in January 2023.  She subsequently developed intra-abdominal fluid collections felt to be postoperative.  Back pain was then identified to be secondary to discitis.  Also identified was a perirectal abscess that required IR aspiration and drainage.  Patient subsequently was on Zosyn via PICC line and vancomycin orally for C. difficile prophylaxis.  Subsequently she has been transitioned to oral Augmentin and vancomycin.  During her hospitalization she developed new onset atrial fibrillation and is now on apixaban.  Last week the patient was started on Chlorthalidone which she has been taking for the past 6 days.  She also continues to take spironolactone as prior medication.  Patient daughter reports that she has become increasingly weak and fatigued.  Patient reports that she is very weak and nauseated.  She has tried to eat and drink some she has not actively vomited.  Patient has not had syncopal episode but reports that she does feel like she is too weak to get up and move around as she normally would.  No recent fever spike.  ?  ? ?Home Medications ?Prior to Admission medications   ?Medication Sig Start Date End Date Taking? Authorizing Provider  ?acetaminophen (TYLENOL) 500 MG tablet Take 500-1,000 mg by mouth every 6 (six) hours as needed for moderate pain. Depends on pain   Yes [provider]  ?ALPHAGAN P 0.1 % SOLN Place 1 drop into both eyes daily. 03/10/21  Yes [provider]  ?amoxicillin-clavulanate (AUGMENTIN) 875-125 MG tablet Take 1 tablet by mouth 2 (two) times daily for 18 days. 08/27/21 09/14/21 Yes Mignon Pine, DO  ?apixaban (ELIQUIS) 5 MG TABS tablet Take  1 tablet (5 mg total) by mouth 2 (two) times daily. 08/06/21  Yes Bonnielee Haff, MD  ?bimatoprost (LUMIGAN) 0.01 % SOLN Place 1 drop into both eyes at bedtime.   Yes [provider]  ?bisacodyl (DULCOLAX) 5 MG EC tablet Take 1 tablet (5 mg total) by mouth daily as needed for moderate constipation. 01/16/21 01/16/22 Yes Lavina Hamman, MD  ?chlorthalidone (HYGROTON) 25 MG tablet Take 1 tablet (25 mg total) by mouth as directed. Take daily at [facility] 09/01/21 11/30/21 Yes Dunn, Areta Haber, PA-C  ?cholecalciferol (VITAMIN D) 1000 units tablet Take 1,000 Units by mouth daily.   Yes [provider]  ?clonazePAM (KLONOPIN) 0.5 MG tablet Take 1 tablet (0.5 mg total) by mouth at bedtime. 08/07/21  Yes [name], MD  ?ferrous sulfate 325 (65 FE) MG tablet Take 1 tablet (325 mg total) by mouth 2 (two) times daily with a meal. 08/06/21  Yes Bonnielee Haff, MD  ?gabapentin (NEURONTIN) 100 MG capsule Take 100 mg by mouth once. Nightly   Yes [provider]  ?hydrALAZINE (APRESOLINE) 25 MG tablet Take 1 tablet (25 mg total) by mouth 3 (three) times daily as needed (As needed for top blood pressure number greater than 160). 09/01/21 11/30/21 Yes Dunn, Areta Haber, PA-C  ?hydrocortisone 2.5 % cream Place 1 application rectally daily as needed (hemorrhoids). 10/23/18  Yes [provider]  ?Infant Care Products (DERMACLOUD) OINT Apply 1 application topically 2 (two) times daily as needed. 06/16/21  Yes Venia Carbon, MD  ?  Iron-FA-B Cmp-C-Biot-Probiotic (FUSION PLUS) CAPS Take 1 capsule by mouth once daily 06/22/21  Yes Vanga, Tally Due, MD  ?levothyroxine (SYNTHROID) 100 MCG tablet Take 1 tablet by mouth once daily 04/08/21  Yes [name], MD  ?metoprolol tartrate (LOPRESSOR) 50 MG tablet Take 1 tablet (50 mg total) by mouth 2 (two) times daily. 08/06/21  Yes Bonnielee Haff, MD  ?Multiple Vitamin (MULTIVITAMIN ADULT PO) Take by mouth daily.   Yes [provider]  ?omeprazole (PRILOSEC) 20  MG capsule Take 20 mg by mouth daily as needed (heartburn).   Yes [provider]  ?ondansetron (ZOFRAN-ODT) 4 MG disintegrating tablet Take 1 tablet (4 mg total) by mouth every 8 (eight) hours as needed for nausea or vomiting. 08/26/21  Yes Mignon Pine, DO  ?polyethylene glycol (MIRALAX / GLYCOLAX) 17 g packet Take 17 g by mouth daily.   Yes [provider]  ?sertraline (ZOLOFT) 100 MG tablet Take 1 tablet (100 mg total) by mouth in the morning. 08/14/21  Yes [name], MD  ?Simethicone 180 MG CAPS Take 1 capsule by mouth daily as needed (indigestion).   Yes [provider]  ?spironolactone (ALDACTONE) 25 MG tablet Take 1 tablet (25 mg total) by mouth daily. 03/31/21  Yes [name], MD  ?vancomycin (VANCOCIN) 125 MG capsule Take 1 capsule (125 mg total) by mouth 2 (two) times daily. 08/06/21 09/21/21 Yes Mignon Pine, DO  ?VOLTAREN 1 % GEL APPLY ONE APPLICATION TOPICALLY 3 TIMES DAILY 09/26/14  Yes [name], MD  ?Wheat Dextrin (BENEFIBER DRINK MIX PO) Take 15 mLs by mouth daily.   Yes [provider]  ?albuterol (PROVENTIL HFA;VENTOLIN HFA) 108 (90 Base) MCG/ACT inhaler Inhale 2 puffs into the lungs every 6 (six) hours as needed for wheezing or shortness of breath. 01/12/18   [name], MD  ?colchicine 0.6 MG tablet Take 1 tablet (0.6 mg total) by mouth daily as needed (gout flare). 12/05/20   [name], MD  ?Famotidine-Ca Carb-Mag Hydrox (PEPCID COMPLETE PO) Take 1 tablet by mouth daily as needed (indigestion).    [provider]  ?furosemide (LASIX) 20 MG tablet Take 20 mg by mouth daily as needed for edema or fluid.    [provider]  ?loperamide (IMODIUM) 2 MG capsule Take 1 capsule (2 mg total) by mouth 3 (three) times daily as needed for diarrhea or loose stools. 08/06/21   Bonnielee Haff, MD  ?Misc Natural Products (OSTEO BI-FLEX TRIPLE STRENGTH PO) Take 1 tablet by mouth daily.    [provider]  ?    ? ?Allergies    ?Amlodipine, Ace inhibitors, Carvedilol, Losartan, Other, Paroxetine hcl, Risedronate sodium, Simvastatin, Toprol xl [metoprolol], Alendronate sodium, Influenza vaccines, Silenor [doxepin hcl], and Sulfonamide derivatives   ? ?Review of Systems   ?Review of Systems ?10 systems reviewed and negative except as per HPI ?Physical Exam ?Updated Vital Signs ?BP (!) 188/85   Pulse (!) 57   Temp 97.9 ?F (36.6 ?C) (Oral)   Resp 18   SpO2 96%  ?Physical Exam ?Constitutional:   ?   Comments: Patient is alert.  Mental status clear.  She does not have respiratory distress at rest  ?HENT:  ?   Mouth/Throat:  ?   Mouth: Mucous membranes are moist.  ?   Pharynx: Oropharynx is clear.  ?Eyes:  ?   Extraocular Movements: Extraocular movements intact.  ?Cardiovascular:  ?   Rate and Rhythm: Normal rate and regular rhythm.  ?Pulmonary:  ?  Effort: Pulmonary effort is normal.  ?   Breath sounds: Normal breath sounds.  ?Abdominal:  ?   General: There is no distension.  ?   Palpations: Abdomen is soft.  ?   Tenderness: There is no abdominal tenderness. There is no guarding.  ?Musculoskeletal:     ?   General: No swelling, tenderness, deformity or signs of injury. Normal range of motion.  ?   Right lower leg: No edema.  ?   Left lower leg: No edema.  ?Skin: ?   General: Skin is warm and dry.  ?Neurological:  ?   General: No focal deficit present.  ?   Mental Status: She is oriented to person, place, and time.  ?   Coordination: Coordination normal.  ?Psychiatric:     ?   Mood and Affect: Mood normal.  ? ? ?ED Results / Procedures / Treatments   ?Labs ?(all labs ordered are listed, but only abnormal results are displayed) ?Labs Reviewed  ?BASIC METABOLIC PANEL - Abnormal; Notable for the following components:  ?    Result Value  ? Sodium 114 (*)   ? Chloride 78 (*)   ? Glucose, Bld 119 (*)   ? All other components within normal limits  ?CBC - Abnormal; Notable for the following components:  ? WBC 11.7 (*)   ? RBC 5.15  (*)   ? RDW 18.3 (*)   ? All other components within normal limits  ?SODIUM - Abnormal; Notable for the following components:  ? Sodium 114 (*)   ? All other components within normal limits  ?URINALYSIS, ROUT

## 2021-09-06 NOTE — Plan of Care (Signed)
[facility] will assume care on arrival to accepting facility. Until arrival, care as per EDP. However, [facility] available 24/7 for questions and assistance.  Nursing staff, please page [facility] Admits and Consults ([phone]) as soon as the patient arrives to the hospital.   

## 2021-09-06 NOTE — ED Notes (Signed)
CRITICAL VALUE STICKER ? ?CRITICAL VALUE:Na+ 114 ? ?RECEIVER (on-site recipient of call):Shawnie Pons, RN ? ?DATE & TIME NOTIFIED:  ? ?MESSENGER (representative from lab): ? ?MD NOTIFIED: Dr. Johnney Killian ? ?TIME OF NOTIFICATION:1800 ? ?RESPONSE:  ? ?

## 2021-09-06 NOTE — ED Triage Notes (Signed)
Patient reports to the ER for hypertension in the evenings. Patient has also had some nausea and emesis with weakness.  ?

## 2021-09-07 ENCOUNTER — Encounter (HOSPITAL_COMMUNITY): Payer: Self-pay | Admitting: Internal Medicine

## 2021-09-07 DIAGNOSIS — N1831 Chronic kidney disease, stage 3a: Secondary | ICD-10-CM

## 2021-09-07 DIAGNOSIS — R531 Weakness: Secondary | ICD-10-CM | POA: Diagnosis not present

## 2021-09-07 DIAGNOSIS — R112 Nausea with vomiting, unspecified: Secondary | ICD-10-CM | POA: Diagnosis not present

## 2021-09-07 DIAGNOSIS — I130 Hypertensive heart and chronic kidney disease with heart failure and stage 1 through stage 4 chronic kidney disease, or unspecified chronic kidney disease: Secondary | ICD-10-CM | POA: Diagnosis present

## 2021-09-07 DIAGNOSIS — M4647 Discitis, unspecified, lumbosacral region: Secondary | ICD-10-CM | POA: Diagnosis present

## 2021-09-07 DIAGNOSIS — F419 Anxiety disorder, unspecified: Secondary | ICD-10-CM | POA: Diagnosis not present

## 2021-09-07 DIAGNOSIS — Z96612 Presence of left artificial shoulder joint: Secondary | ICD-10-CM | POA: Diagnosis present

## 2021-09-07 DIAGNOSIS — Z8249 Family history of ischemic heart disease and other diseases of the circulatory system: Secondary | ICD-10-CM | POA: Diagnosis not present

## 2021-09-07 DIAGNOSIS — E871 Hypo-osmolality and hyponatremia: Secondary | ICD-10-CM

## 2021-09-07 DIAGNOSIS — I10 Essential (primary) hypertension: Secondary | ICD-10-CM | POA: Diagnosis not present

## 2021-09-07 DIAGNOSIS — Z8616 Personal history of COVID-19: Secondary | ICD-10-CM | POA: Diagnosis not present

## 2021-09-07 DIAGNOSIS — Z7901 Long term (current) use of anticoagulants: Secondary | ICD-10-CM | POA: Diagnosis not present

## 2021-09-07 DIAGNOSIS — I5032 Chronic diastolic (congestive) heart failure: Secondary | ICD-10-CM | POA: Diagnosis present

## 2021-09-07 DIAGNOSIS — I38 Endocarditis, valve unspecified: Secondary | ICD-10-CM

## 2021-09-07 DIAGNOSIS — E876 Hypokalemia: Secondary | ICD-10-CM | POA: Diagnosis not present

## 2021-09-07 DIAGNOSIS — Z833 Family history of diabetes mellitus: Secondary | ICD-10-CM | POA: Diagnosis not present

## 2021-09-07 DIAGNOSIS — K219 Gastro-esophageal reflux disease without esophagitis: Secondary | ICD-10-CM | POA: Diagnosis present

## 2021-09-07 DIAGNOSIS — I350 Nonrheumatic aortic (valve) stenosis: Secondary | ICD-10-CM | POA: Diagnosis not present

## 2021-09-07 DIAGNOSIS — Z66 Do not resuscitate: Secondary | ICD-10-CM | POA: Diagnosis present

## 2021-09-07 DIAGNOSIS — I4819 Other persistent atrial fibrillation: Secondary | ICD-10-CM

## 2021-09-07 DIAGNOSIS — E222 Syndrome of inappropriate secretion of antidiuretic hormone: Secondary | ICD-10-CM | POA: Diagnosis not present

## 2021-09-07 DIAGNOSIS — E785 Hyperlipidemia, unspecified: Secondary | ICD-10-CM | POA: Diagnosis present

## 2021-09-07 DIAGNOSIS — F32A Depression, unspecified: Secondary | ICD-10-CM | POA: Diagnosis not present

## 2021-09-07 DIAGNOSIS — R0602 Shortness of breath: Secondary | ICD-10-CM | POA: Diagnosis not present

## 2021-09-07 DIAGNOSIS — Z79890 Hormone replacement therapy (postmenopausal): Secondary | ICD-10-CM | POA: Diagnosis not present

## 2021-09-07 DIAGNOSIS — Z882 Allergy status to sulfonamides status: Secondary | ICD-10-CM | POA: Diagnosis not present

## 2021-09-07 DIAGNOSIS — R110 Nausea: Secondary | ICD-10-CM | POA: Diagnosis not present

## 2021-09-07 DIAGNOSIS — E039 Hypothyroidism, unspecified: Secondary | ICD-10-CM

## 2021-09-07 DIAGNOSIS — I509 Heart failure, unspecified: Secondary | ICD-10-CM | POA: Diagnosis not present

## 2021-09-07 DIAGNOSIS — Z79899 Other long term (current) drug therapy: Secondary | ICD-10-CM | POA: Diagnosis not present

## 2021-09-07 DIAGNOSIS — Z888 Allergy status to other drugs, medicaments and biological substances status: Secondary | ICD-10-CM | POA: Diagnosis not present

## 2021-09-07 LAB — BASIC METABOLIC PANEL
Anion gap: 12 (ref 5–15)
Anion gap: 11 (ref 5–15)
BUN: 10 mg/dL (ref 8–23)
BUN: 11 mg/dL (ref 8–23)
CO2: 23 mmol/L (ref 22–32)
CO2: 22 mmol/L (ref 22–32)
Calcium: 9.1 mg/dL (ref 8.9–10.3)
Calcium: 8.6 mg/dL — ABNORMAL LOW (ref 8.9–10.3)
Chloride: 82 mmol/L — ABNORMAL LOW (ref 98–111)
Chloride: 82 mmol/L — ABNORMAL LOW (ref 98–111)
Creatinine, Ser: 0.84 mg/dL (ref 0.44–1.00)
Creatinine, Ser: 0.77 mg/dL (ref 0.44–1.00)
GFR, Estimated: >60 mL/min (ref >60)
GFR, Estimated: >60 mL/min (ref >60)
Glucose, Bld: 133 mg/dL — ABNORMAL HIGH (ref 70–99)
Glucose, Bld: 152 mg/dL — ABNORMAL HIGH (ref 70–99)
Potassium: 3.3 mmol/L — ABNORMAL LOW (ref 3.5–5.1)
Potassium: 4 mmol/L (ref 3.5–5.1)
Sodium: 117 mmol/L — CL (ref 135–145)
Sodium: 115 mmol/L — CL (ref 135–145)

## 2021-09-07 LAB — OSMOLALITY: Osmolality: 242 mOsm/kg — CL (ref 275–295)

## 2021-09-07 LAB — CORTISOL: Cortisol, Plasma: 31.4 ug/dL

## 2021-09-07 LAB — TSH: TSH: 18.42 u[IU]/mL — ABNORMAL HIGH (ref 0.350–4.500)

## 2021-09-07 LAB — SODIUM: Sodium: 114 mmol/L — CL (ref 135–145)

## 2021-09-07 LAB — MRSA NEXT GEN BY PCR, NASAL: MRSA by PCR Next Gen: NOT DETECTED

## 2021-09-07 LAB — OSMOLALITY, URINE: Osmolality, Ur: 347 mOsm/kg (ref 300–900)

## 2021-09-07 LAB — T4, FREE: Free T4: 0.96 ng/dL (ref 0.61–1.12)

## 2021-09-07 LAB — SODIUM, URINE, RANDOM: Sodium, Ur: 59 mmol/L

## 2021-09-07 MED ORDER — ATROPINE SULFATE 1 MG/10ML IJ SOSY
PREFILLED_SYRINGE | INTRAMUSCULAR | Status: AC
  Filled 2021-09-07: qty 10

## 2021-09-07 MED ORDER — LEVOTHYROXINE SODIUM 50 MCG PO TABS
50.0000 ug | ORAL_TABLET | Freq: Once | ORAL | Status: AC
  Administered 2021-09-07: 50 ug via ORAL
  Filled 2021-09-07: qty 1

## 2021-09-07 MED ORDER — LATANOPROST 0.005 % OP SOLN
1.0000 [drp] | Freq: Every day | OPHTHALMIC | Status: DC
  Administered 2021-09-07 – 2021-09-16 (×10): 1 [drp] via OPHTHALMIC
  Filled 2021-09-07: qty 2.5

## 2021-09-07 MED ORDER — LEVOTHYROXINE SODIUM 25 MCG PO TABS
150.0000 ug | ORAL_TABLET | Freq: Every day | ORAL | Status: DC
  Administered 2021-09-08: 150 ug via ORAL
  Filled 2021-09-07: qty 2

## 2021-09-07 MED ORDER — POTASSIUM CHLORIDE CRYS ER 20 MEQ PO TBCR
40.0000 meq | EXTENDED_RELEASE_TABLET | ORAL | Status: AC
  Administered 2021-09-07 (×2): 40 meq via ORAL
  Filled 2021-09-07 (×2): qty 2

## 2021-09-07 MED ORDER — GABAPENTIN 100 MG PO CAPS
100.0000 mg | ORAL_CAPSULE | Freq: Once | ORAL | Status: AC
  Administered 2021-09-07: 100 mg via ORAL
  Filled 2021-09-07: qty 1

## 2021-09-07 MED ORDER — ONDANSETRON HCL 4 MG/2ML IJ SOLN
4.0000 mg | Freq: Four times a day (QID) | INTRAMUSCULAR | Status: DC | PRN
  Administered 2021-09-07: 4 mg via INTRAVENOUS
  Filled 2021-09-07: qty 2

## 2021-09-07 MED ORDER — AMOXICILLIN-POT CLAVULANATE 875-125 MG PO TABS
1.0000 | ORAL_TABLET | Freq: Two times a day (BID) | ORAL | Status: DC
  Administered 2021-09-07: 1 via ORAL
  Filled 2021-09-07: qty 1

## 2021-09-07 MED ORDER — ATROPINE SULFATE 1 MG/10ML IJ SOSY: 0.5000 mg | PREFILLED_SYRINGE | INTRAMUSCULAR | Status: DC | PRN

## 2021-09-07 MED ORDER — ACETAMINOPHEN 325 MG PO TABS
650.0000 mg | ORAL_TABLET | Freq: Four times a day (QID) | ORAL | Status: DC | PRN
  Administered 2021-09-07 – 2021-09-10 (×3): 650 mg via ORAL
  Filled 2021-09-07 (×3): qty 2

## 2021-09-07 MED ORDER — MAGNESIUM SULFATE 2 GM/50ML IV SOLN
2.0000 g | Freq: Once | INTRAVENOUS | Status: AC
  Administered 2021-09-07: 2 g via INTRAVENOUS
  Filled 2021-09-07: qty 50

## 2021-09-07 MED ORDER — SODIUM CHLORIDE 0.9 % IV SOLN: INTRAVENOUS | Status: DC

## 2021-09-07 MED ORDER — SODIUM CHLORIDE 0.9 % IV SOLN
3.0000 g | Freq: Three times a day (TID) | INTRAVENOUS | Status: DC
  Administered 2021-09-07 – 2021-09-09 (×5): 3 g via INTRAVENOUS
  Filled 2021-09-07 (×5): qty 8

## 2021-09-07 MED ORDER — BRIMONIDINE TARTRATE 0.15 % OP SOLN
1.0000 [drp] | Freq: Every day | OPHTHALMIC | Status: DC
Start: 2021-09-07 — End: 2021-09-17
  Administered 2021-09-07 – 2021-09-17 (×11): 1 [drp] via OPHTHALMIC
  Filled 2021-09-07: qty 5

## 2021-09-07 MED ORDER — PROCHLORPERAZINE EDISYLATE 10 MG/2ML IJ SOLN
5.0000 mg | Freq: Three times a day (TID) | INTRAMUSCULAR | Status: DC
  Administered 2021-09-07 – 2021-09-10 (×9): 5 mg via INTRAVENOUS
  Filled 2021-09-07 (×5): qty 1
  Filled 2021-09-07: qty 2
  Filled 2021-09-07 (×3): qty 1
  Filled 2021-09-07: qty 2

## 2021-09-07 MED ORDER — PANTOPRAZOLE SODIUM 40 MG PO TBEC
40.0000 mg | DELAYED_RELEASE_TABLET | Freq: Two times a day (BID) | ORAL | Status: DC
  Administered 2021-09-07 – 2021-09-17 (×20): 40 mg via ORAL
  Filled 2021-09-07 (×20): qty 1

## 2021-09-07 MED ORDER — HYDRALAZINE HCL 10 MG PO TABS
10.0000 mg | ORAL_TABLET | Freq: Four times a day (QID) | ORAL | Status: DC | PRN
Start: 2021-09-07 — End: 2021-09-08
  Administered 2021-09-08: 10 mg via ORAL
  Filled 2021-09-07: qty 1

## 2021-09-07 MED ORDER — ACETAMINOPHEN 650 MG RE SUPP: 650.0000 mg | Freq: Four times a day (QID) | RECTAL | Status: DC | PRN

## 2021-09-07 MED ORDER — SODIUM CHLORIDE 3 % IV SOLN
INTRAVENOUS | Status: DC
  Filled 2021-09-07 (×2): qty 500

## 2021-09-07 MED ORDER — CHLORHEXIDINE GLUCONATE CLOTH 2 % EX PADS
6.0000 | MEDICATED_PAD | Freq: Every day | CUTANEOUS | Status: DC
  Administered 2021-09-07: 6 via TOPICAL

## 2021-09-07 MED ORDER — ONDANSETRON HCL 4 MG PO TABS
4.0000 mg | ORAL_TABLET | Freq: Four times a day (QID) | ORAL | Status: DC | PRN
  Administered 2021-09-09: 4 mg via ORAL
  Filled 2021-09-07: qty 1

## 2021-09-07 MED ORDER — ISOSORBIDE MONONITRATE ER 30 MG PO TB24
15.0000 mg | ORAL_TABLET | Freq: Every evening | ORAL | Status: DC
  Administered 2021-09-07 – 2021-09-16 (×10): 15 mg via ORAL
  Filled 2021-09-07 (×10): qty 1

## 2021-09-07 NOTE — Progress Notes (Addendum)
Her Na is 117 and osmolarity is 242. Notified MD on call and received new orders. ?

## 2021-09-07 NOTE — Progress Notes (Signed)
Pharmacy Antibiotic Note ? ?Brooke Beasley is a 88 y.o. female admitted on 09/06/2021 with Discitis.  Pharmacy has been consulted for Unasyn dosing. Patient is unable to tolerate oral Augmentin currently (not eating any solid food).  ?Last Augmentin dose given this morning after a dose of Zofran, and per RN, has kept down so far. ? ?Plan: ?Start Unasyn 3 grams IV every 8 hours ?F/u ability to eat ?Monitor clinical progress, cultures/sensitivities, renal function, abx plan ? ? ?Height: '5\' 1"'$  (154.9 cm) ?Weight: 70.1 kg (154 lb 8.7 oz) ?IBW/kg (Calculated) : 47.8 ? ?Temp (24hrs), Avg:97.9 ?F (36.6 ?C), Min:97.7 ?F (36.5 ?C), Max:98.2 ?F (36.8 ?C) ? ?Recent Labs  ?Lab 09/06/21 ?1716 09/07/21 ?0130 09/07/21 ?0431 09/07/21 ?0814  ?WBC 11.7*  --   --   --   ?CREATININE 0.86 0.72 0.84 0.81  ?  ?Estimated Creatinine Clearance: 43 mL/min (by C-G formula based on SCr of 0.81 mg/dL).   ? ?Allergies  ?Allergen Reactions  ? Amlodipine Other (See Comments)  ?  Stomach pains--"feel like my insides are on fire"  ? Ace Inhibitors Cough  ? Carvedilol Other (See Comments)  ?  fatigue  ? Losartan Other (See Comments)  ?  Cr bumped  ? Other Other (See Comments)  ?  No seeds or corn because of IBS  ? Paroxetine Hcl Other (See Comments)  ?  Not effective - felt ill on this medicine  ? Risedronate Sodium   ?  REACTION: joint pain  ? Simvastatin   ?  REACTION: the full 20 mg pill causes leg pain- can tol 10 mg  ? Toprol Xl [Metoprolol] Diarrhea  ?  Diarrhea and weakness  ? Alendronate Sodium Palpitations  ? Influenza Vaccines Rash and Other (See Comments)  ?  Led to mental breakdown in 53s ?  ? Silenor [Doxepin Hcl] Palpitations  ?  Hallucinations and racing heart  ? Sulfonamide Derivatives Rash  ? ? ?Antimicrobials this admission: ?5/4 Augmentin >> 5/15 (started PTA, plan through 5/22) ?5/15 Unasyn >> ? ?4/13 vanc PO 125 BID >> (5/29) ppx for hx of cdiff ? ?Microbiology results: ?See history of previous admissions and ID  f/u ? ? ? ?Thank you for allowing [place] to participate in this patients care. ?Jens Som, PharmD ?09/07/2021 11:19 AM ? ?**Pharmacist phone directory can be found on [email or web address] listed under [facility]** ? ? ?

## 2021-09-07 NOTE — H&P (Signed)
?History and Physical  ? ? ?Shannelle Alguire HYW:[id number] DOB: 02-03-1931 DOA: 09/06/2021 ? ?DOS: the patient was seen and examined on 09/06/2021 ? ?PCP: [name], MD  ? ?Patient coming from: Home ? ?I have personally briefly reviewed patient's old medical records in [place] ? ?CC: weakness, elevated BP, N/V ?HPI: ?88 year old white female known to me from her prior admission and April 2023 with a history of atrial fibrillation, discitis after a perirectal abscess, hypertension with multiple drug intolerances, CKD stage IIIa, mitral regurg, hypothyroidism, presents to the ER today with several days of nausea, vomiting, weakness.  Blood pressures have been elevated for the last week. ? ?Patient initially admitted in April 2023 due to back pain.  She was found to have discitis.  The previous month she was noted to have a perirectal abscess.  Cultures from disc aspiration grew out Proteus mirabilis and Citrobacter koseri.  Patient went home with a PICC line and IV Zosyn.  She was seen back in ID clinic.  She was having some fevers.  Her PICC line was pulled and patient started on p.o. Augmentin.  Her fevers resolved.  Patient's been on p.o. vancomycin for C. difficile prophylaxis. ? ?Patient's been having elevated blood pressures mostly in the morning.  She was seen in the office on 09/01/2021 the cardiology office. ? ?Patient has multiple drug intolerances. ?lisinopril was discontinued due to cough, losartan discontinued due to worsening renal function, amlodipine stopped due to abdominal pain, and carvedilol was previously stopped due to fatigue ? ?Patient started on chlorthalidone 25 mg daily.  Hydralazine 25 mg 3 times daily as needed. ? ?She was continued on Lopressor 25 mg twice daily and spironolactone 25 mg daily. ? ?When she arrived to the ER, temp 97.9 heart rate 57 blood pressure 178/109.  Patient's daughter states that the patient has a blood pressure cuff at home and systolic blood  pressures are greater than 200. ? ?Patient given 10 mg of IV hydralazine.  Daughter states that the patient started having abdominal pain shortly thereafter. ? ?Chemistries were checked and the patient is known to be severely hyponatremic with a serum sodium of 114. ? ?CT head was negative for acute intracranial abnormality ?Chest x-ray showed no acute cardiopulmonary disease. ? ?Patient transferred to [facility] for further evaluation.  ? ?ED Course: sodium 114, CT head negative ? ?Review of Systems:  ?Review of Systems  ?Constitutional:  Positive for malaise/fatigue.  ?HENT: Negative.    ?Eyes: Negative.   ?Respiratory: Negative.    ?Cardiovascular:  Negative for chest pain and palpitations.  ?Gastrointestinal:  Positive for nausea and vomiting.  ?     Abd discomfort but no overt pain after IV hydralazine  ?Genitourinary: Negative.   ?Musculoskeletal:  Positive for back pain.  ?     Chronic back pain  ?Skin: Negative.   ?Neurological:  Positive for weakness.  ?Endo/Heme/Allergies: Negative.   ?Psychiatric/Behavioral: Negative.    ?All other systems reviewed and are negative. ? ?Past Medical History:  ?Diagnosis Date  ? Anxiety   ? C. difficile diarrhea 07/11/2018  ? S/p multiple oral vanc treatments (course and tapers) and completed Zinplava monoclonal Ab treatment (05/10/2019) Fecal transplant on hold during [place] pandemic  ? CAP (community acquired pneumonia) 12/22/2017  ? Carotid stenosis 10/18/2014  ? R 1-39%, L 40-59%, rpt 1 yr (09/2014)   ? CKD (chronic kidney disease) stage 3, GFR 30-59 ml/min (HCC) 08/02/2014  ? Closed fracture of right orbit ([place]) 02/10/2021  ?  Clostridioides difficile infection   ? hx 2020  ? COVID-19 virus infection 10/13/2020  ? Degenerative disc disease   ? LS  ? Depression   ? nervous breakdonw in 1964-out of work for a year  ? Diverticulosis   ? severe by colonoscopy  ? Family history of adverse reaction to anesthesia   ? n/v  ? GERD (gastroesophageal reflux disease)   ? Glaucoma   ?  Heart murmur 08/2011  ? mitral regurge - on echo   ? History of hiatal hernia   ? History of shingles   ? HTN (hypertension)   ? Hyperlipidemia   ? Hypertension   ? Hypothyroidism   ? IBS 11/02/2006  ? Intestinal bacterial overgrowth   ? In small colon  ? Left shoulder pain 11/04/2014  ? Lower GI bleed 12/2020  ? thought diverticular complicated by ABLA with syncope and orbital fracture s/p hospitalization  ? Maxillary fracture ([place]) 04/08/2012  ? Osteoarthritis 2016  ? Jefm Bryant)  ? Osteoporosis   ? dexa 2011  ? Perirectal abscess 08/03/2021  ? CT guided aspiration of abscess grow Proteums mirabilis and Citrobacter koseri 07/2021, currently receiving IV Zosyn planned total 6 wks.  ? Pseudogout 2016  ? shoulders ([place])  ? ? ?Past Surgical History:  ?Procedure Laterality Date  ? barium enema  2012  ? severe diverticulosis, redundant colon  ? Bowel obstruction  1999  ? no surgery in hosp x 3 days  ? BUBBLE STUDY  07/27/2021  ? Procedure: BUBBLE STUDY;  Surgeon: Dixie Dials, MD;  Location: [place];  Service: Cardiovascular;;  ? COLONOSCOPY  1. 1999  2. 11/04  ? 1. Not finished  2. Slight hemorrhage rectosigmoid area, severe sig diverticulosis  ? COLONOSCOPY WITH PROPOFOL N/A 09/10/2019  ? SSP with dysplasia, TA, rpt 3 yrs (Vanga, Tally Due, MD)  ? Dexa  1. 0973-5329   2. 9/04  3. 3/08   ? 1. OP  2. OP, borderline, spine -2.44T  3. decreased BMD-OP  ? DG KNEE 1-2 VIEWS BILAT    ? LS x-ray with degenerative disc and facet change  ? ESOPHAGOGASTRODUODENOSCOPY    ? Negative  ? FLEXIBLE SIGMOIDOSCOPY N/A 01/16/2021  ? Procedure: FLEXIBLE SIGMOIDOSCOPY;  Surgeon: Gatha Mayer, MD;  Location: [facility] ENDOSCOPY;  Service: Endoscopy;  Laterality: N/A;  or unsedated  ? Hemorrhoid procedure  07/2006  ? PICC LINE REMOVAL ([place] HX)  08/28/2021  ? PROCTOSCOPY N/A 05/26/2021  ? Procedure: RIGID PROCTOSCOPY;  Surgeon: Michael Boston, MD;  Location: [facility] ORS;  Service: General;  Laterality: N/A;  ? RECTOPEXY N/A 05/26/2021  ?  Procedure: RECTOPEXY;  Surgeon: Michael Boston, MD;  Location: [facility] ORS;  Service: General;  Laterality: N/A;  ? TEE WITHOUT CARDIOVERSION N/A 07/27/2021  ? Procedure: TRANSESOPHAGEAL ECHOCARDIOGRAM (TEE);  Surgeon: Dixie Dials, MD;  Location: [place];  Service: Cardiovascular;  Laterality: N/A;  ? TONSILLECTOMY    ? TOTAL SHOULDER ARTHROPLASTY Left 11/27/2015  ? Corky Mull, MD  ? TOTAL SHOULDER REVISION Left 03/16/2016  ? Procedure: TOTAL SHOULDER REVISION;  Surgeon: Corky Mull, MD;  Location: [facility] ORS;  Service: Orthopedics;  Laterality: Left;  ? US ECHOCARDIOGRAPHY  07/2011  ? Normal systolic fxn with EF 92-42%.  Focal basal septal hypertrophy.  Mild diastolic dysfunction.  Mild MR.  ? XI ROBOTIC ASSISTED LOWER ANTERIOR RESECTION N/A 05/26/2021  ? Procedure: ROBOTIC LOW ANTERIOR RECTOSIGMOID RESECTION; ASSESSMENT OF TISSUE PERFUSION WITH FIREFLY;  Surgeon: Michael Boston, MD;  Location: [facility] OR[facility];  Service: General;  Laterality: N/A;  ? ? ? reports that she has never smoked. She has never used smokeless tobacco. She reports that she does not drink alcohol and does not use drugs. ? ?Allergies  ?Allergen Reactions  ? Amlodipine Other (See Comments)  ?  Stomach pains--"feel like my insides are on fire"  ? Ace Inhibitors Cough  ? Carvedilol Other (See Comments)  ?  fatigue  ? Losartan Other (See Comments)  ?  Cr bumped  ? Other Other (See Comments)  ?  No seeds or corn because of IBS  ? Paroxetine Hcl Other (See Comments)  ?  Not effective - felt ill on this medicine  ? Risedronate Sodium   ?  REACTION: joint pain  ? Simvastatin   ?  REACTION: the full 20 mg pill causes leg pain- can tol 10 mg  ? Toprol Xl [Metoprolol] Diarrhea  ?  Diarrhea and weakness  ? Alendronate Sodium Palpitations  ? Influenza Vaccines Rash and Other (See Comments)  ?  Led to mental breakdown in 51s ?  ? Silenor [Doxepin Hcl] Palpitations  ?  Hallucinations and racing heart  ? Sulfonamide Derivatives Rash  ? ? ?Family History  ?Problem  Relation Age of Onset  ? Diabetes Brother   ?     post-op  ? Coronary artery disease Brother   ? Diabetes Paternal Grandfather   ? Breast cancer Cousin   ? ? ?Prior to Admission medications   ?Medication [address]

## 2021-09-07 NOTE — Progress Notes (Addendum)
eLink Physician-Brief Progress Note ?Patient Name: Brooke Beasley ?DOB: Oct 04, 1930 ?MRN: [id number] ? ? ?Date of Service ? 09/07/2021  ?HPI/Events of Note ? Notified of hyponatremia with Na at 114 (from 115) with 3% hypertonic saline running at 30cc/hr.   ?eICU Interventions ? Increase hypertonic saline to 40cc/hr. Follow serum Na.   ? ? ? ?Intervention Category ?Intermediate Interventions: Electrolyte abnormality - evaluation and management ? ?Elsie Lincoln ?09/07/2021, 8:02 PM ? ?10:52 PM ?Notified of hypertension with BP 159/83. HR fluctuating in the 40s-60s. ? ?Plan> ?Give hydralazine PO.  ? ?11:52 PM ?RN reported HR going down into the 30s-50s. She was given her regular dose of metoprolol earlier.  ? ?Plan> ?Hold all AV nodal blocking agents. ?Give atropine if HR persistently in the 30s. ? ?12:35 AM ?Pt with desaturation into the 70s but would recover when the patient is awakened. Pt with OSA. ? ?Plan> ?CPAP QHS.  ? ?3:37 AM ?BP at 170s with oral hydralazine.  BP 176/77, HR 51, RR 16, O2 sats 99% on CPAP. ? ?Plan> ?Give hydralazine '20mg'$  IV. ? ?3:42 AM ?Serum Na now at 116 <-- 114. ? ?Plan> ?Continue hypertonic saline at 40cc/hr. ?

## 2021-09-07 NOTE — Assessment & Plan Note (Signed)
TSH was slightly elevated the last time she was in the hospital. Repeat TSH and add FT4. Pt on 100 mcg of synthroid daily. ?

## 2021-09-07 NOTE — Assessment & Plan Note (Signed)
Continue augmentin. And prophylactic po vanco(for hx of c. Diff)  ?

## 2021-09-07 NOTE — Progress Notes (Signed)
OT Cancellation Note ? ?Patient Details ?Name: Brooke Beasley ?MRN: [id number] ?DOB: Mar 06, 1931 ? ? ?Cancelled Treatment:    Reason Eval/Treat Not Completed: Medical issues which prohibited therapy (Pt with vomiting this morning, will follow.) ? ?[name] ?09/07/2021, 9:38 AM ?Nestor Lewandowsky, OTR/L ?Acute Rehabilitation Services ?Pager: [phone] ?Office: [phone]  ?

## 2021-09-07 NOTE — Subjective & Objective (Signed)
CC: weakness, elevated BP, N/V ?HPI: ?88 year old white female known to me from her prior admission and April 2023 with a history of atrial fibrillation, discitis after a perirectal abscess, hypertension with multiple drug intolerances, CKD stage IIIa, mitral regurg, hypothyroidism, presents to the ER today with several days of nausea, vomiting, weakness.  Blood pressures have been elevated for the last week. ? ?Patient initially admitted in April 2023 due to back pain.  She was found to have discitis.  The previous month she was noted to have a perirectal abscess.  Cultures from disc aspiration grew out Proteus mirabilis and Citrobacter koseri.  Patient went home with a PICC line and IV Zosyn.  She was seen back in ID clinic.  She was having some fevers.  Her PICC line was pulled and patient started on p.o. Augmentin.  Her fevers resolved.  Patient's been on p.o. vancomycin for C. difficile prophylaxis. ? ?Patient's been having elevated blood pressures mostly in the morning.  She was seen in the office on 09/01/2021 the cardiology office. ? ?Patient has multiple drug intolerances. ?lisinopril was discontinued due to cough, losartan discontinued due to worsening renal function, amlodipine stopped due to abdominal pain, and carvedilol was previously stopped due to fatigue ? ?Patient started on chlorthalidone 25 mg daily.  Hydralazine 25 mg 3 times daily as needed. ? ?She was continued on Lopressor 25 mg twice daily and spironolactone 25 mg daily. ? ?When she arrived to the ER, temp 97.9 heart rate 57 blood pressure 178/109.  Patient's daughter states that the patient has a blood pressure cuff at home and systolic blood pressures are greater than 200. ? ?Patient given 10 mg of IV hydralazine.  Daughter states that the patient started having abdominal pain shortly thereafter. ? ?Chemistries were checked and the patient is known to be severely hyponatremic with a serum sodium of 114. ? ?CT head was negative for acute  intracranial abnormality ?Chest x-ray showed no acute cardiopulmonary disease. ? ?Patient transferred to [facility] for further evaluation. ?

## 2021-09-07 NOTE — Assessment & Plan Note (Signed)
Verified with pt's dtr [name] that pt remains a DNR/DNI. ?

## 2021-09-07 NOTE — Assessment & Plan Note (Signed)
Admit to telemetry bed. Pt given 250 ml IV NS in ER. Repeat BMP. Check urine, serum osm. Check TSH, check cortisol. Does not appear to be hypo or hypervolemic.   ?

## 2021-09-07 NOTE — Progress Notes (Signed)
TSH elevated to 18.4. FT4 normal at 0.96. cortisol 31.4 ? ?Will increase daily synthroid to 150 mcg daily. ?

## 2021-09-07 NOTE — Consult Note (Signed)
? ?NAME:  Brooke Beasley, MRN:  [id number], DOB:  [birth year], LOS: 0 ?ADMISSION DATE:  09/06/2021, CONSULTATION DATE:  5/15 ?REFERRING MD:  Dr. Sloan Leiter, CHIEF COMPLAINT:  N/V, decreased intake, hyponatremia  ? ?History of Present Illness:  ?88 y/o F who presented to [facility] ER on 5/14 with reports of decreased intake, nausea and vomiting.   ? ?At baseline, the patient has chronic hyponatremia with baseline Na+ ranging from 129-137. She recently has had L5-S1 discitis in the setting of proteus/citrobacter infection in 07/2021.  She was initially treated with IV zosyn and then transitioned to Augmentin.  In addition, she was recently started on chlorthalidone.  She takes zoloft for depression.   ? ?She presented 5/14 with c/o of n/v and decreased intake.  The patient was found to have a serum Na+ of 114 on presentation.  She was given NS in the ER with improvement to 117.  She subsequently drifted to 113.  Additional lab findings included a TSH of 18.420, cortisol 31.4, and low blood osmolality 242. Urine osmolality 347, urine Na 59.  Nephrology was consulted for low sodium and felt that the patient had SIADH.  Recommendations for hypertonic saline per Nephrology.   ? ?PCCM consulted for ICU transfer.  ? ?Pertinent  Medical History  ?Chronic Hyponatremia  ?CKD III ?HTN ?HLD  ?Carotid Stenosis  ?Mitral Regurgitation  ?Anxiety  ?Hx GIB  ?Diverticulosis  ?IBS ?Osteoporosis  ?Pseudogout  ?C-DIff  ?COVID - 09/2020 ? ?Significant Hospital Events: ?Including procedures, antibiotic start and stop dates in addition to other pertinent events   ?5/14 Admit  ?5/15 PCCM consulted for Hyponatremia  ? ?Interim History / Subjective:  ?Afebrile ?Most recent Na+ 115 (from 1420) ? ?Objective   ?Blood pressure (!) 145/86, pulse 60, temperature 98.2 ?F (36.8 ?C), temperature source Oral, resp. rate 15, height '5\' 1"'$  (1.549 m), weight 70.1 kg, SpO2 97 %. ?   ?   ? ?Intake/Output Summary (Last 24 hours) at 09/07/2021 1608 ?Last data filed at  09/07/2021 1100 ?Gross per 24 hour  ?Intake 290.86 ml  ?Output 600 ml  ?Net -309.14 ml  ? ?Filed Weights  ? 09/07/21 0329  ?Weight: 70.1 kg  ? ? ?Examination: ?General: elderly adult female lying in bed in NAD ?HENT: MM pink/dry, anicteric, HOH ?Lungs: non-labored at rest on RA, lungs bilaterally clear  ?Cardiovascular: s1s2 irr irr, AF on monitor, SEM ?Abdomen: soft, bsx4 active ?Extremities: warm/dry, no edema  ?Neuro: sleeping, awakens to voice, speech clear, MAE  ? ? ?Resolved Hospital Problem list   ? ? ?Assessment & Plan:  ? ?Acute on Chronic Hyponatremia / SIADH  ?Multifactorial in setting in setting of chronically low Na+, zoloft, chlorthalidone at baseline. N/V and poor PO intake recently in setting of use antibiotics. Elevated urine Na+ & osmolality consistent with SIADH.  ?-transfer to ICU for hypertonic saline  ?-appreciate Nephrology input ?-follow Q4 Na+ ?-Na goal rise no more than 8 mEq in 24 hours  ?-seizure precautions  ?-free water restriction  ?-hold chlorthalidone, zoloft  ? ?L5/S1 Discitis with Abscess  ?-continue unasyn ?-original plan was to stay on abx until 5/22  ? ?HTN ?AF ?-continue lopressor  ?-tele monitoring  ?-continue full dose anticoagulation  ? ?Hypokalemia  ?-monitor, replace as indicated  ? ? ?Best Practice (right click and "Reselect all SmartList Selections" daily)  ?Diet/type: Regular consistency (see orders) ?DVT prophylaxis: DOAC ?GI prophylaxis: PPI ?Lines: N/A ?Foley:  N/A ?Code Status:  DNR ?Last date of multidisciplinary goals of care discussion:  DNR.  Patient updated on plan of care 5/15.  ? ?Labs   ?CBC: ?Recent Labs  ?Lab 09/06/21 ?1716  ?WBC 11.7*  ?HGB 14.6  ?HCT 43.0  ?MCV 83.5  ?PLT 357  ? ? ?Basic Metabolic Panel: ?Recent Labs  ?Lab 09/06/21 ?1716 09/06/21 ?1853 09/07/21 ?0130 09/07/21 ?0431 09/07/21 ?0814  ?NA 114* 114* 113* 117* 113*  ?K 3.6  --  3.5 3.3* 3.1*  ?CL 78*  --  79* 82* 79*  ?CO2 24  --  23 23 21*  ?GLUCOSE 119*  --  124* 133* 148*  ?BUN 14  --  '12 10  10  '$ ?CREATININE 0.86  --  0.72 0.84 0.81  ?CALCIUM 10.0  --  9.0 9.1 8.9  ? ?GFR: ?Estimated Creatinine Clearance: 43 mL/min (by C-G formula based on SCr of 0.81 mg/dL). ?Recent Labs  ?Lab 09/06/21 ?1716  ?WBC 11.7*  ? ? ?Liver Function Tests: ?No results for input(s): AST, ALT, ALKPHOS, BILITOT, PROT, ALBUMIN in the last 168 hours. ?No results for input(s): LIPASE, AMYLASE in the last 168 hours. ?No results for input(s): AMMONIA in the last 168 hours. ? ?ABG ?No results found for: PHART, PCO2ART, PO2ART, HCO3, TCO2, ACIDBASEDEF, O2SAT  ? ?Coagulation Profile: ?No results for input(s): INR, PROTIME in the last 168 hours. ? ?Cardiac Enzymes: ?No results for input(s): CKTOTAL, CKMB, CKMBINDEX, TROPONINI in the last 168 hours. ? ?HbA1C: ?Hgb A1c MFr Bld  ?Date/Time Value Ref Range Status  ?05/22/2021 10:08 AM 5.6 4.8 - 5.6 % Final  ?  Comment:  ?  (NOTE) ?Pre diabetes:          5.7%-6.4% ? ?Diabetes:              >6.4% ? ?Glycemic control for   <7.0% ?adults with diabetes ?  ?01/15/2021 03:13 AM 5.7 (H) 4.8 - 5.6 % Final  ?  Comment:  ?  (NOTE) ?        Prediabetes: 5.7 - 6.4 ?        Diabetes: >6.4 ?        Glycemic control for adults with diabetes: <7.0 ?  ? ? ?CBG: ?No results for input(s): GLUCAP in the last 168 hours. ? ?Review of Systems: Positives in [place]   ?Gen: Denies fever, chills, weight change, fatigue, night sweats ?HEENT: Denies blurred vision, double vision, hearing loss, tinnitus, sinus congestion, rhinorrhea, sore throat, neck stiffness, dysphagia ?PULM: Denies shortness of breath, cough, sputum production, hemoptysis, wheezing ?CV: Denies chest pain, edema, orthopnea, paroxysmal nocturnal dyspnea, palpitations ?GI: Denies abdominal pain, nausea, vomiting, diarrhea, hematochezia, melena, constipation, change in bowel habits. Decreased PO intake ?GU: Denies dysuria, hematuria, polyuria, oliguria, urethral discharge ?Endocrine: Denies hot or cold intolerance, polyuria, polyphagia or appetite  change ?Derm: Denies rash, dry skin, scaling or peeling skin change ?Heme: Denies easy bruising, bleeding, bleeding gums ?Neuro: Denies headache, numbness, weakness, slurred speech, loss of memory or consciousness ? ?Past Medical History:  ?She,  has a past medical history of Anxiety, C. difficile diarrhea (07/11/2018), CAP (community acquired pneumonia) (12/22/2017), Carotid stenosis (10/18/2014), CKD (chronic kidney disease) stage 3, GFR 30-59 ml/min ([place]) (08/02/2014), Closed fracture of right orbit ([place]) (02/10/2021), Clostridioides difficile infection, COVID-19 virus infection (10/13/2020), Degenerative disc disease, Depression, Diverticulosis, Family history of adverse reaction to anesthesia, GERD (gastroesophageal reflux disease), Glaucoma, Heart murmur (08/2011), History of hiatal hernia, History of shingles, HTN (hypertension), Hyperlipidemia, Hypertension, Hypothyroidism, IBS (11/02/2006), Intestinal bacterial overgrowth, Left shoulder pain (11/04/2014), Lower GI bleed (12/2020), Maxillary fracture ([place]) (04/08/2012),  Osteoarthritis (2016), Osteoporosis, Perirectal abscess (08/03/2021), and Pseudogout (2016).  ? ?Surgical History:  ? ?Past Surgical History:  ?Procedure Laterality Date  ? barium enema  2012  ? severe diverticulosis, redundant colon  ? Bowel obstruction  1999  ? no surgery in hosp x 3 days  ? BUBBLE STUDY  07/27/2021  ? Procedure: BUBBLE STUDY;  Surgeon: Dixie Dials, MD;  Location: [place];  Service: Cardiovascular;;  ? COLONOSCOPY  1. 1999  2. 11/04  ? 1. Not finished  2. Slight hemorrhage rectosigmoid area, severe sig diverticulosis  ? COLONOSCOPY WITH PROPOFOL N/A 09/10/2019  ? SSP with dysplasia, TA, rpt 3 yrs (Vanga, Tally Due, MD)  ? Dexa  1. 8875-7972   2. 9/04  3. 3/08   ? 1. OP  2. OP, borderline, spine -2.44T  3. decreased BMD-OP  ? DG KNEE 1-2 VIEWS BILAT    ? LS x-ray with degenerative disc and facet change  ? ESOPHAGOGASTRODUODENOSCOPY    ? Negative  ? FLEXIBLE  SIGMOIDOSCOPY N/A 01/16/2021  ? Procedure: FLEXIBLE SIGMOIDOSCOPY;  Surgeon: Gatha Mayer, MD;  Location: [facility] ENDOSCOPY;  Service: Endoscopy;  Laterality: N/A;  or unsedated  ? Hemorrhoid procedure  07/2006  ? PICC LINE

## 2021-09-07 NOTE — Assessment & Plan Note (Signed)
Stable. Rate controlled with lopressor. On Eliquis. ?

## 2021-09-07 NOTE — Evaluation (Signed)
Physical Therapy Evaluation ?Patient Details ?Name: Brooke Beasley ?MRN: [id number] ?DOB: 01/31/1931 ?Today's Date: 09/07/2021 ? ?History of Present Illness ? 88 year old white female presents to the ER 09/07/21 with several days of nausea, vomiting, weakness. +hyponatremia,  PMH atrial fibrillation, discitis after a perirectal abscess, hypertension, CKD stage IIIa, mitral regurg, hypothyroidism  ?Clinical Impression ?  ?Pt admitted secondary to problem above with deficits below. PTA patient was home with daughter and son-in-law living with her. She has been using a RW inside home and walking shorter distances with someone with her (due to recent illness). She uses a transport chair in the community.  Pt currently requires minguard assist for OOB to/from [facility] with assist for clothing manipulation due to pt feeling too weak to complete herself (didn't feel she could take her hands off walker).  Anticipate patient will benefit from PT to address problems listed below.Will continue to follow acutely to maximize functional mobility independence and safety.   ?   ?   ? ?Recommendations for follow up therapy are one component of a multi-disciplinary discharge planning process, led by the attending physician.  Recommendations may be updated based on patient status, additional functional criteria and insurance authorization. ? ?Follow Up Recommendations Home health PT (may progress to no PT needed) ? ?  ?Assistance Recommended at Discharge Intermittent Supervision/Assistance  ?Patient can return home with the following ? A little help with walking and/or transfers;A little help with bathing/dressing/bathroom;Assistance with cooking/housework;Help with stairs or ramp for entrance ? ?  ?Equipment Recommendations None recommended by PT  ?Recommendations for Other Services ? OT consult  ?  ?Functional Status Assessment Patient has had a recent decline in their functional status and demonstrates the ability to make  significant improvements in function in a reasonable and predictable amount of time.  ? ?  ?Precautions / Restrictions Precautions ?Precautions: Fall  ? ?  ? ?Mobility ? Bed Mobility ?Overal bed mobility: Modified Independent ?  ?  ?  ?  ?  ?  ?General bed mobility comments: from supine to sit and from sit to supine ?  ? ?Transfers ?Overall transfer level: Needs assistance ?Equipment used: Rolling walker (2 wheels) ?Transfers: Sit to/from Stand, Bed to chair/wheelchair/BSC ?Sit to Stand: Min guard ?  ?Step pivot transfers: Min guard ?  ?  ?  ?General transfer comment: to/from [facility] with RW ?  ? ?Ambulation/Gait ?  ?  ?  ?  ?  ?  ?  ?General Gait Details: pt deferred due to nausea and feeling globally weak ? ?Stairs ?  ?  ?  ?  ?  ? ?Wheelchair Mobility ?  ? ?Modified Rankin (Stroke Patients Only) ?  ? ?  ? ?Balance Overall balance assessment: Needs assistance ?Sitting-balance support: No upper extremity supported, Feet unsupported ?Sitting balance-Leahy Scale: Fair ?  ?  ?Standing balance support: Bilateral upper extremity supported, Reliant on assistive device for balance ?Standing balance-Leahy Scale: Poor ?Standing balance comment: pt feeling too weak to pull down/up her brief in standing ?  ?  ?  ?  ?  ?  ?  ?  ?  ?  ?  ?   ? ? ? ?Pertinent Vitals/Pain Pain Assessment ?Pain Assessment: No/denies pain  ? ? ?Home Living Family/patient expects to be discharged to:: Private residence ?Living Arrangements: Children ?Available Help at Discharge: Family;Available 24 hours/day ?Type of Home: House ?Home Access: Ramped entrance ?  ?  ?  ?Home Layout: One level ?Home Equipment: Shower seat;Grab bars - tub/shower;Transport  chair;Rolling Walker (2 wheels);Rollator (4 wheels);Cane - quad;BSC/3in1;Grab bars - toilet ?Additional Comments: Daughter and SIL live with her  ?  ?Prior Function Prior Level of Function : Needs assist ?  ?  ?  ?  ?  ?  ?Mobility Comments: using RW inside home; someone has been with her recently while  she walks due to illness; uses transport chair outside home ?  ?  ? ? ?Hand Dominance  ?   ? ?  ?Extremity/Trunk Assessment  ? Upper Extremity Assessment ?Upper Extremity Assessment: Defer to OT evaluation ?  ? ?Lower Extremity Assessment ?Lower Extremity Assessment: Generalized weakness ?  ? ?Cervical / Trunk Assessment ?Cervical / Trunk Assessment: Normal  ?Communication  ? Communication: HOH  ?Cognition Arousal/Alertness: Awake/alert ?Behavior During Therapy: Flat affect ?Overall Cognitive Status: Difficult to assess ?  ?  ?  ?  ?  ?  ?  ?  ?  ?  ?  ?  ?  ?  ?  ?  ?General Comments: following commands with some repetition needed due to [facility] ?  ?  ? ?  ?General Comments   ? ?  ?Exercises    ? ?Assessment/Plan  ?  ?PT Assessment Patient needs continued PT services  ?PT Problem List Decreased strength;Decreased activity tolerance;Decreased balance;Decreased mobility;Obesity ? ?   ?  ?PT Treatment Interventions DME instruction;Gait training;Functional mobility training;Therapeutic activities;Therapeutic exercise;Balance training;Patient/family education   ? ?PT Goals (Current goals can be found in the Care Plan section)  ?Acute Rehab PT Goals ?Patient Stated Goal: to feel better ?PT Goal Formulation: With patient ?Time For Goal Achievement: 09/21/21 ?Potential to Achieve Goals: Good ? ?  ?Frequency Min 3X/week ?  ? ? ?Co-evaluation   ?  ?  ?  ?  ? ? ?  ?AM-PAC PT "6 Clicks" Mobility  ?Outcome Measure Help needed turning from your back to your side while in a flat bed without using bedrails?: None ?Help needed moving from lying on your back to sitting on the side of a flat bed without using bedrails?: None ?Help needed moving to and from a bed to a chair (including a wheelchair)?: A Little ?Help needed standing up from a chair using your arms (e.g., wheelchair or bedside chair)?: A Little ?Help needed to walk in hospital room?: A Little ?Help needed climbing 3-5 steps with a railing? : Total ?6 Click Score: 18 ? ?   ?End of Session Equipment Utilized During Treatment: Gait belt ?Activity Tolerance: Patient limited by fatigue ?Patient left: in bed;with call bell/phone within reach;with bed alarm set ?  ?PT Visit Diagnosis: Difficulty in walking, not elsewhere classified (R26.2);Muscle weakness (generalized) (M62.81) ?  ? ?Time: 0045-9977 ?PT Time Calculation (min) (ACUTE ONLY): 15 min ? ? ?Charges:   PT Evaluation ?$PT Eval Low Complexity: 1 Low ?  ?  ?   ? ? ? ?Arby Barrette, PT ?Acute Rehabilitation Services  ?Pager [phone] ?Office [phone] ? ? ?Jeanie Cooks Tris Howell ?09/07/2021, 3:18 PM ? ?

## 2021-09-07 NOTE — Assessment & Plan Note (Signed)
Stable

## 2021-09-07 NOTE — Assessment & Plan Note (Signed)
Stablel. Has MR by on TEE last month. ?

## 2021-09-07 NOTE — Consult Note (Signed)
[facility] ?Nephrology Consultation Note ? ?Requesting MD:  ?Reason for consult: hyponatremia ? ?HPI:  ?Brooke Beasley is a 88 y.o. female with history of hypothyroidism, anxiety/depression, CKD IIIa, HTN recently started on chlorthalidone, AS, A Fib on apixaban, recent L5-S1 discitis/abscess presenting with 4-day history of nausea and poor oral intake found to have severe hyponatremia. Found to be hyponatremic on presentation to the ED 5/14 with Na 114. Labs significant for low osmolality 242, high TSH 18.4 with normal T4 0.96, normal random cortisol 31.4. CT head obtained with no acute intracranial abnormality. Urine osmolality and urine sodium ordered which has not yet been collected. Patient was started on NS at maintenance rate with no significant improvement in sodium. ? ?Patient reports that she has had issues with nausea for the past few months at least, but worse in the past few days. She is feeling better with antiemetics given here but had an episode of vomiting this morning. She reports she drinks at least a half gallon of water and tea a day, but had been drinking less the past few days due to nausea.  Denies confusion, shortness of breath, leg swelling.  Takes Tylenol on occasion for aches and pains, denies NSAID use. ? ?Creat  ?Date/Time Value Ref Range Status  ?08/26/2021 03:45 PM 1.07 (H) 0.60 - 0.95 mg/dL Final  ? ?Creatinine, Ser  ?Date/Time Value Ref Range Status  ?09/07/2021 08:14 AM 0.81 0.44 - 1.00 mg/dL Final  ?09/07/2021 04:31 AM 0.84 0.44 - 1.00 mg/dL Final  ?09/07/2021 01:30 AM 0.72 0.44 - 1.00 mg/dL Final  ?09/06/2021 05:16 PM 0.86 0.44 - 1.00 mg/dL Final  ?08/06/2021 02:50 AM 0.98 0.44 - 1.00 mg/dL Final  ?08/05/2021 01:52 AM 0.97 0.44 - 1.00 mg/dL Final  ?08/04/2021 02:21 AM 0.88 0.44 - 1.00 mg/dL Final  ?08/03/2021 03:36 AM 1.14 (H) 0.44 - 1.00 mg/dL Final  ?08/02/2021 05:13 AM 1.07 (H) 0.44 - 1.00 mg/dL Final  ?08/01/2021 02:19 AM 0.98 0.44 - 1.00 mg/dL Final   ?07/30/2021 07:58 AM 0.85 0.44 - 1.00 mg/dL Final  ?07/29/2021 03:56 AM 0.93 0.44 - 1.00 mg/dL Final  ?07/28/2021 03:07 AM 0.78 0.44 - 1.00 mg/dL Final  ?07/27/2021 03:37 AM 0.77 0.44 - 1.00 mg/dL Final  ?07/26/2021 05:24 AM 0.94 0.44 - 1.00 mg/dL Final  ?07/25/2021 05:44 AM 1.05 (H) 0.44 - 1.00 mg/dL Final  ?07/24/2021 04:49 AM 1.04 (H) 0.44 - 1.00 mg/dL Final  ?07/23/2021 03:55 PM 0.90 0.44 - 1.00 mg/dL Final  ?05/30/2021 05:18 AM 0.79 0.44 - 1.00 mg/dL Final  ?05/28/2021 06:00 AM 0.75 0.44 - 1.00 mg/dL Final  ?05/28/2021 02:37 AM 0.96 0.44 - 1.00 mg/dL Final  ?05/27/2021 02:24 AM 1.08 (H) 0.44 - 1.00 mg/dL Final  ?05/22/2021 10:08 AM 1.00 0.44 - 1.00 mg/dL Final  ?05/14/2021 03:09 PM 1.05 0.40 - 1.20 mg/dL Final  ?03/27/2021 09:35 AM 1.06 (H) 0.57 - 1.00 mg/dL Final  ?02/10/2021 01:13 PM 1.08 0.40 - 1.20 mg/dL Final  ?01/15/2021 08:32 AM 0.93 0.44 - 1.00 mg/dL Final  ?01/14/2021 07:09 AM 1.02 (H) 0.44 - 1.00 mg/dL Final  ?11/03/2020 12:28 PM 1.18 0.40 - 1.20 mg/dL Final  ?09/29/2020 04:00 PM 1.09 0.40 - 1.20 mg/dL Final  ?03/17/2020 03:19 PM 0.97 0.44 - 1.00 mg/dL Final  ?02/12/2020 11:52 AM 1.04 (H) 0.44 - 1.00 mg/dL Final  ?11/06/2019 08:57 AM 0.93 0.40 - 1.20 mg/dL Final  ?09/26/2019 11:01 AM 0.95 0.40 - 1.20 mg/dL Final  ?07/09/2019 05:07 PM 0.90 0.40 - 1.20 mg/dL Final  ?  03/28/2019 11:09 AM 1.01 (H) 0.57 - 1.00 mg/dL Final  ?10/16/2018 08:10 AM 0.97 0.40 - 1.20 mg/dL Final  ?06/23/2018 03:51 PM 1.00 0.44 - 1.00 mg/dL Final  ?12/19/2017 09:08 AM 1.09 0.40 - 1.20 mg/dL Final  ?10/13/2017 09:17 AM 1.08 0.40 - 1.20 mg/dL Final  ?12/10/2016 10:33 AM 0.92 0.40 - 1.20 mg/dL Final  ?08/03/2016 02:45 PM 0.94 0.40 - 1.20 mg/dL Final  ?04/09/2016 08:39 AM 1.03 0.40 - 1.20 mg/dL Final  ?11/28/2015 06:02 AM 0.85 0.44 - 1.00 mg/dL Final  ?11/17/2015 01:52 PM 1.17 (H) 0.44 - 1.00 mg/dL Final  ?09/25/2015 08:29 AM 0.95 0.40 - 1.20 mg/dL Final  ?04/03/2015 12:00 PM 1.06 0.40 - 1.20 mg/dL Final  ?12/19/2014 01:40 PM 1.17  0.40 - 1.20 mg/dL Final  ?09/19/2014 10:49 AM 1.15 0.40 - 1.20 mg/dL Final  ?08/09/2014 03:25 PM 1.33 (H) 0.40 - 1.20 mg/dL Final  ?07/15/2014 11:42 AM 1.20 0.40 - 1.20 mg/dL Final  ?07/04/2014 01:58 PM 1.11 0.40 - 1.20 mg/dL Final  ? ? ? ?PMHx: ?  ?Past Medical History:  ?Diagnosis Date  ? Anxiety   ? C. difficile diarrhea 07/11/2018  ? S/p multiple oral vanc treatments (course and tapers) and completed Zinplava monoclonal Ab treatment (05/10/2019) Fecal transplant on hold during [place] pandemic  ? CAP (community acquired pneumonia) 12/22/2017  ? Carotid stenosis 10/18/2014  ? R 1-39%, L 40-59%, rpt 1 yr (09/2014)   ? CKD (chronic kidney disease) stage 3, GFR 30-59 ml/min ([facility]) 08/02/2014  ? Closed fracture of right orbit ([place]) 02/10/2021  ? Clostridioides difficile infection   ? hx 2020  ? COVID-19 virus infection 10/13/2020  ? Degenerative disc disease   ? LS  ? Depression   ? nervous breakdonw in 1964-out of work for a year  ? Diverticulosis   ? severe by colonoscopy  ? Family history of adverse reaction to anesthesia   ? n/v  ? GERD (gastroesophageal reflux disease)   ? Glaucoma   ? Heart murmur 08/2011  ? mitral regurge - on echo   ? History of hiatal hernia   ? History of shingles   ? HTN (hypertension)   ? Hyperlipidemia   ? Hypertension   ? Hypothyroidism   ? IBS 11/02/2006  ? Intestinal bacterial overgrowth   ? In small colon  ? Left shoulder pain 11/04/2014  ? Lower GI bleed 12/2020  ? thought diverticular complicated by ABLA with syncope and orbital fracture s/p hospitalization  ? Maxillary fracture ([place]) 04/08/2012  ? Osteoarthritis 2016  ? Jefm [place])  ? Osteoporosis   ? dexa 2011  ? Perirectal abscess 08/03/2021  ? CT guided aspiration of abscess grow Proteums mirabilis and Citrobacter koseri 07/2021, currently receiving IV Zosyn planned total 6 wks.  ? Pseudogout 2016  ? shoulders ([place])  ? ? ?Past Surgical History:  ?Procedure Laterality Date  ? barium enema  2012  ? severe diverticulosis, redundant colon   ? Bowel obstruction  1999  ? no surgery in hosp x 3 days  ? BUBBLE STUDY  07/27/2021  ? Procedure: BUBBLE STUDY;  Surgeon: Dixie Dials, MD;  Location: [place];  Service: Cardiovascular;;  ? COLONOSCOPY  1. 1999  2. 11/04  ? 1. Not finished  2. Slight hemorrhage rectosigmoid area, severe sig diverticulosis  ? COLONOSCOPY WITH PROPOFOL N/A 09/10/2019  ? SSP with dysplasia, TA, rpt 3 yrs (Vanga, Tally Due, MD)  ? Dexa  1. 0254-2706   2. 9/04  3. 3/08   ? 1. OP  2. OP, borderline, spine -2.44T  3. decreased BMD-OP  ? DG KNEE 1-2 VIEWS BILAT    ? LS x-ray with degenerative disc and facet change  ? ESOPHAGOGASTRODUODENOSCOPY    ? Negative  ? FLEXIBLE SIGMOIDOSCOPY N/A 01/16/2021  ? Procedure: FLEXIBLE SIGMOIDOSCOPY;  Surgeon: Gatha Mayer, MD;  Location: [facility] ENDOSCOPY;  Service: Endoscopy;  Laterality: N/A;  or unsedated  ? Hemorrhoid procedure  07/2006  ? PICC LINE REMOVAL ([place] HX)  08/28/2021  ? PROCTOSCOPY N/A 05/26/2021  ? Procedure: RIGID PROCTOSCOPY;  Surgeon: Michael Boston, MD;  Location: [facility];  Service: General;  Laterality: N/A;  ? RECTOPEXY N/A 05/26/2021  ? Procedure: RECTOPEXY;  Surgeon: Michael Boston, MD;  Location: [facility];  Service: General;  Laterality: N/A;  ? TEE WITHOUT CARDIOVERSION N/A 07/27/2021  ? Procedure: TRANSESOPHAGEAL ECHOCARDIOGRAM (TEE);  Surgeon: Dixie Dials, MD;  Location: [place];  Service: Cardiovascular;  Laterality: N/A;  ? TONSILLECTOMY    ? TOTAL SHOULDER ARTHROPLASTY Left 11/27/2015  ? Corky Mull, MD  ? TOTAL SHOULDER REVISION Left 03/16/2016  ? Procedure: TOTAL SHOULDER REVISION;  Surgeon: Corky Mull, MD;  Location: [facility] ORS;  Service: Orthopedics;  Laterality: Left;  ? US ECHOCARDIOGRAPHY  07/2011  ? Normal systolic fxn with EF 50-53%.  Focal basal septal hypertrophy.  Mild diastolic dysfunction.  Mild MR.  ? XI ROBOTIC ASSISTED LOWER ANTERIOR RESECTION N/A 05/26/2021  ? Procedure: ROBOTIC LOW ANTERIOR RECTOSIGMOID RESECTION; ASSESSMENT OF TISSUE  PERFUSION WITH FIREFLY;  Surgeon: Michael Boston, MD;  Location: [facility];  Service: General;  Laterality: N/A;  ? ? ?Family Hx:  ?Family History  ?Problem Relation Age of Onset  ? Diabetes Brother   ?     post-op

## 2021-09-07 NOTE — Progress Notes (Signed)
?  Transition of Care (TOC) Screening Note ? ? ?Patient Details  ?Name: Brooke Beasley ?Date of Birth: 01-Apr-1931 ? ? ?Transition of Care (TOC) CM/SW Contact:    ?Cyndi Bender, RN ?Phone Number: ?09/07/2021, 7:53 AM ? ? ? ?Transition of Care Department [facility]) has reviewed patient and no TOC needs have been identified at this time. We will continue to monitor patient advancement through interdisciplinary progression rounds. If new patient transition needs arise, please place a TOC consult. ? ? ?

## 2021-09-07 NOTE — Assessment & Plan Note (Signed)
Pt with multiple drug intolerances. Likely the chlorthalidone contributing to or the cause of her hyponatremia.  dtr thinks that hydralazine cause pt's stomach to be upset. May need to start long acting imdur.  Will check renal artery ultrasound. ?

## 2021-09-07 NOTE — Progress Notes (Signed)
PT Cancellation Note ? ?Patient Details ?Name: Brooke Beasley ?MRN: [id number] ?DOB: [birth year] ? ? ?Cancelled Treatment:    Reason Eval/Treat Not Completed: Medical issues which prohibited therapy ? ?Patient recently vomiting per pt/daughter. RN made aware. Will reattempt PT evaluation as appropriate.  ? ? ?Arby Barrette, PT ?Acute Rehabilitation Services  ?Pager [phone] ?Office [phone] ? ? ?Brooke Beasley ?09/07/2021, 8:53 AM ?

## 2021-09-07 NOTE — Progress Notes (Addendum)
?      ?                 PROGRESS NOTE ? ?      ?PATIENT DETAILS ?Name: Brooke Beasley ?Age: 88 y.o. ?Sex: female ?Date of Birth: 1930/05/13 ?Admit Date: 09/06/2021 ?Admitting Physician Shela Leff, MD ?[id number], Garlon Hatchet, MD ? ?Brief Summary: ?Patient is a 88 y.o.  female with recent history of L5-S1 discitis (Proteus/Citrobacter)-April 2023-no longer on IV Zosyn-now on oral Augmentin until 5/22-recently started on chlorthalidone by outpatient MD-presented to the hospital with 4-day history of nausea/poor oral intake (mostly consuming liquids)-found to have severe hyponatremia and subsequently admitted to the hospitalist service. ? ? ?Significant events: ?3/30-4/13>> hospitalization for L5-S1 discitis/abscess posterior to rectum-cultures Proteus/Citrobacter-discharged on IV Zosyn-stop date 5/22.  New onset A-fib-started on anticoagulation. ?5/03>> outpatient MRI-resolution of L5-S1 epidural abscess, persistent L5-S1 discitis/osteomyelitis. ?5/11>> due to persistent leukocytosis-intermittent fevers-PICC line removed on 5/5-transition to Augmentin by ID in the outpatient setting. ?5/14>> admit for hyponatremia. ? ? ?Significant studies: ?5/14>> CXR: No PNA ?5/14>> CT head: No acute abnormality. ?5/15>> TSH: 18.4 ?5/15>> cortisol a.m.: 31.4 ? ? ?Significant microbiology data: ? ? ?Procedures: ? ? ?Consults: ?None  ? ?Subjective: ?Continues to complain of nausea-continues to have fatigue. ? ?Objective: ?Vitals: ?Blood pressure (!) 146/73, pulse 66, temperature 97.7 ?F (36.5 ?C), temperature source Oral, resp. rate 18, height '5\' 1"'$  (1.549 m), weight 70.1 kg, SpO2 95 %.  ? ?Exam: ?Gen Exam:Alert awake-not in any distress ?HEENT:atraumatic, normocephalic ?Chest: B/L clear to auscultation anteriorly ?CVS:S1S2 regular ?Abdomen:soft non tender, non distended ?Extremities:no edema ?Neurology: Non focal ?Skin: no rash ? ?Pertinent Labs/Radiology: ? ?  Latest Ref Rng & Units 09/06/2021  ?  5:16 PM 08/26/2021  ?   3:45 PM 08/06/2021  ?  2:50 AM  ?CBC  ?WBC 4.0 - 10.5 K/uL 11.7   13.6   9.4    ?Hemoglobin 12.0 - 15.0 g/dL 14.6   12.0   11.0    ?Hematocrit 36.0 - 46.0 % 43.0   38.1   33.6    ?Platelets 150 - 400 K/uL 357   293   265    ?  ?Lab Results  ?Component Value Date  ? NA 113 (LL) 09/07/2021  ? K 3.1 (L) 09/07/2021  ? CL 79 (L) 09/07/2021  ? CO2 21 (L) 09/07/2021  ?  ? ? ?Assessment/Plan: ?Hyponatremia: Suspect this is a combination of mostly chlorthalidone and excessive free water/liquid intake for the past 4 days due to persistent nausea.  Volume status appears stable-no response to IVF so far-we will recheck electrolytes later today-awaiting urine osmolality/urine sodium.  If no improvement-May need to consider SIADH physiology in the setting of persistent nausea/Zoloft use as well.  If no improvement-we will consult nephrology for assistance.  Although unlikely this is from use of Zoloft-I will go ahead and stop it. ? ?Intractable nausea: Unclear etiology-abdominal exam is benign-last BM yesterday.  Not sure whether this is from Augmentin which she was started on recently.  Per daughter at bedside-no response to Zofran-she was already on this in the outpatient setting.  We will try scheduled Compazine before meals to see if any improvement-change PPI to twice daily dosing.  We will stop Augmentin and transition to Unasyn for a few days to see if nausea gets better. ? ?L 5-S1 discitis/abscess: Recent scans reassuring-on Augmentin until 5/22 ? ?History of C. difficile: On prophylactic oral vancomycin as patient is on antibiotics for discitis. ? ?HTN: BP reasonable-continue metoprolol. ? ?  Persistent atrial fibrillation: Continue Lopressor-on Eliquis. ? ?Chronic HFpEF: Volume status stable. ? ?Aortic valve stenosis: Stable for outpatient monitoring with periodic echo ? ?Hypothyroidism: TSH significantly elevated-Synthroid has been increased to 150 mcg.  Repeat TSH in 3 months. ? ?Anxiety/depression: Holding Zoloft-see  above-continue Klonopin. ? ?BMI: ?Estimated body mass index is 29.2 kg/m? as calculated from the following: ?  Height as of this encounter: '5\' 1"'$  (1.549 m). ?  Weight as of this encounter: 70.1 kg.  ? ?Code status: ?  Code Status: DNR  ? ?DVT Prophylaxis: ?SCDs Start: 09/07/21 0430 ?apixaban (ELIQUIS) tablet 5 mg   ? ?Family Communication: Daughter at bedside on 5/15 ? ? ?Disposition Plan: ?Status is: Inpatient ?Remains inpatient appropriate because: Severe hyponatremia requiring inpatient management and IVF.  Stable for discharge. ?  ?Planned Discharge Destination:Home health ? ? ?Diet: ?Diet Order   ? ?       ?  Diet regular Room service appropriate? Yes; Fluid consistency: Thin  Diet effective now       ?  ? ?  ?  ? ?  ?  ? ? ?Antimicrobial agents: ?Anti-infectives (From admission, onward)  ? ? Start     Dose/Rate Route Frequency Ordered Stop  ? 09/07/21 1000  vancomycin (VANCOCIN) capsule 125 mg       ? 125 mg Oral 2 times daily 09/06/21 2332 09/22/21 0959  ? 09/07/21 1000  amoxicillin-clavulanate (AUGMENTIN) 875-125 MG per tablet 1 tablet       ? 1 tablet Oral Every 12 hours 09/07/21 0429    ? 09/06/21 2200  piperacillin-tazobactam (ZOSYN) IVPB 3.375 g  Status:  Discontinued       ? 3.375 g ?12.5 mL/hr over 240 Minutes Intravenous Every 8 hours 09/06/21 2124 09/07/21 0428  ? ?  ? ? ? ?MEDICATIONS: ?Scheduled Meds: ? amoxicillin-clavulanate  1 tablet Oral Q12H  ? apixaban  5 mg Oral BID  ? brimonidine  1 drop Both Eyes Daily  ? clonazePAM  0.5 mg Oral QHS  ? ferrous sulfate  325 mg Oral BID WC  ? isosorbide mononitrate  15 mg Oral QPM  ? latanoprost  1 drop Both Eyes QHS  ? [START ON 09/08/2021] levothyroxine  150 mcg Oral Daily  ? metoprolol tartrate  50 mg Oral BID  ? pantoprazole  40 mg Oral Daily  ? potassium chloride  40 mEq Oral Q4H  ? sertraline  100 mg Oral q AM  ? vancomycin  125 mg Oral BID  ? ?Continuous Infusions: ? sodium chloride 75 mL/hr at 09/07/21 5852  ? ?PRN Meds:.acetaminophen **OR**  acetaminophen, bisacodyl, ondansetron **OR** ondansetron (ZOFRAN) IV ? ? ?I have personally reviewed following labs and imaging studies ? ?LABORATORY DATA: ?CBC: ?Recent Labs  ?Lab 09/06/21 ?1716  ?WBC 11.7*  ?HGB 14.6  ?HCT 43.0  ?MCV 83.5  ?PLT 357  ? ? ?Basic Metabolic Panel: ?Recent Labs  ?Lab 09/06/21 ?1716 09/06/21 ?1853 09/07/21 ?0130 09/07/21 ?0431 09/07/21 ?0814  ?NA 114* 114* 113* 117* 113*  ?K 3.6  --  3.5 3.3* 3.1*  ?CL 78*  --  79* 82* 79*  ?CO2 24  --  23 23 21*  ?GLUCOSE 119*  --  124* 133* 148*  ?BUN 14  --  '12 10 10  '$ ?CREATININE 0.86  --  0.72 0.84 0.81  ?CALCIUM 10.0  --  9.0 9.1 8.9  ? ? ?GFR: ?Estimated Creatinine Clearance: 43 mL/min (by C-G formula based on SCr of 0.81 mg/dL). ? ?Liver Function Tests: ?  No results for input(s): AST, ALT, ALKPHOS, BILITOT, PROT, ALBUMIN in the last 168 hours. ?No results for input(s): LIPASE, AMYLASE in the last 168 hours. ?No results for input(s): AMMONIA in the last 168 hours. ? ?Coagulation Profile: ?No results for input(s): INR, PROTIME in the last 168 hours. ? ?Cardiac Enzymes: ?No results for input(s): CKTOTAL, CKMB, CKMBINDEX, TROPONINI in the last 168 hours. ? ?BNP (last 3 results) ?Recent Labs  ?  02/10/21 ?1313  ?PROBNP 165.0*  ? ? ?Lipid Profile: ?No results for input(s): CHOL, HDL, LDLCALC, TRIG, CHOLHDL, LDLDIRECT in the last 72 hours. ? ?Thyroid Function Tests: ?Recent Labs  ?  09/07/21 ?0431  ?TSH 18.420*  ?FREET4 0.96  ? ? ?Anemia Panel: ?No results for input(s): VITAMINB12, FOLATE, FERRITIN, TIBC, IRON, RETICCTPCT in the last 72 hours. ? ?Urine analysis: ?   ?Component Value Date/Time  ? [place] YELLOW 09/06/2021 1853  ? APPEARANCEUR CLEAR 09/06/2021 1853  ? LABSPEC 1.011 09/06/2021 1853  ? PHURINE 6.5 09/06/2021 1853  ? GLUCOSEU NEGATIVE 09/06/2021 1853  ? [place] NEGATIVE 09/06/2021 1853  ? HGBUR trace-lysed 06/11/2010 9604  ? [place] NEGATIVE 09/06/2021 1853  ? BILIRUBINUR negative 10/17/2017 1111  ? [place] NEGATIVE 09/06/2021 1853  ?  PROTEINUR NEGATIVE 09/06/2021 1853  ? UROBILINOGEN 0.2 10/17/2017 1111  ? UROBILINOGEN 0.2 04/08/2012 1221  ? NITRITE NEGATIVE 09/06/2021 1853  ? LEUKOCYTESUR NEGATIVE 09/06/2021 1853  ? ? ?Sepsis Labs: ?Lactic Ac

## 2021-09-07 NOTE — Plan of Care (Signed)
  Problem: Health Behavior/Discharge Planning: Goal: Ability to manage health-related needs will improve Outcome: Progressing   Problem: Clinical Measurements: Goal: Diagnostic test results will improve Outcome: Progressing   

## 2021-09-08 DIAGNOSIS — E871 Hypo-osmolality and hyponatremia: Secondary | ICD-10-CM | POA: Diagnosis not present

## 2021-09-08 LAB — SODIUM
Sodium: 120 mmol/L — ABNORMAL LOW (ref 135–145)
Sodium: 123 mmol/L — ABNORMAL LOW (ref 135–145)
Sodium: 121 mmol/L — ABNORMAL LOW (ref 135–145)
Sodium: 122 mmol/L — ABNORMAL LOW (ref 135–145)

## 2021-09-08 LAB — BASIC METABOLIC PANEL
Anion gap: 11 (ref 5–15)
Anion gap: 13 (ref 5–15)
Anion gap: 9 (ref 5–15)
BUN: 9 mg/dL (ref 8–23)
BUN: 10 mg/dL (ref 8–23)
BUN: 11 mg/dL (ref 8–23)
CO2: 20 mmol/L — ABNORMAL LOW (ref 22–32)
CO2: 21 mmol/L — ABNORMAL LOW (ref 22–32)
CO2: 23 mmol/L (ref 22–32)
Calcium: 8.2 mg/dL — ABNORMAL LOW (ref 8.9–10.3)
Calcium: 8.7 mg/dL — ABNORMAL LOW (ref 8.9–10.3)
Calcium: 8.9 mg/dL (ref 8.9–10.3)
Chloride: 85 mmol/L — ABNORMAL LOW (ref 98–111)
Chloride: 79 mmol/L — ABNORMAL LOW (ref 98–111)
Chloride: 89 mmol/L — ABNORMAL LOW (ref 98–111)
Creatinine, Ser: 0.66 mg/dL (ref 0.44–1.00)
Creatinine, Ser: 0.75 mg/dL (ref 0.44–1.00)
Creatinine, Ser: 0.81 mg/dL (ref 0.44–1.00)
GFR, Estimated: >60 mL/min (ref >60)
GFR, Estimated: >60 mL/min (ref >60)
GFR, Estimated: >60 mL/min (ref >60)
Glucose, Bld: 112 mg/dL — ABNORMAL HIGH (ref 70–99)
Glucose, Bld: 122 mg/dL — ABNORMAL HIGH (ref 70–99)
Glucose, Bld: 148 mg/dL — ABNORMAL HIGH (ref 70–99)
Potassium: 3.5 mmol/L (ref 3.5–5.1)
Potassium: 3.1 mmol/L — ABNORMAL LOW (ref 3.5–5.1)
Potassium: 3.5 mmol/L (ref 3.5–5.1)
Sodium: 116 mmol/L — CL (ref 135–145)
Sodium: 113 mmol/L — CL (ref 135–145)
Sodium: 121 mmol/L — ABNORMAL LOW (ref 135–145)

## 2021-09-08 LAB — CBC
HCT: 37.4 % (ref 36.0–46.0)
Hemoglobin: 13.1 g/dL (ref 12.0–15.0)
MCH: 29.3 pg (ref 26.0–34.0)
MCHC: 35 g/dL (ref 30.0–36.0)
MCV: 83.7 fL (ref 80.0–100.0)
Platelets: 318 10*3/uL (ref 150–400)
RBC: 4.47 MIL/uL (ref 3.87–5.11)
RDW: 17.7 % — ABNORMAL HIGH (ref 11.5–15.5)
WBC: 9 10*3/uL (ref 4.0–10.5)
nRBC: 0 % (ref 0.0–0.2)

## 2021-09-08 MED ORDER — SODIUM CHLORIDE 1 G PO TABS
1.0000 g | ORAL_TABLET | Freq: Three times a day (TID) | ORAL | Status: DC
  Administered 2021-09-08 – 2021-09-11 (×11): 1 g via ORAL
  Filled 2021-09-08 (×11): qty 1

## 2021-09-08 MED ORDER — HYDRALAZINE HCL 20 MG/ML IJ SOLN
20.0000 mg | Freq: Four times a day (QID) | INTRAMUSCULAR | Status: DC | PRN
  Administered 2021-09-08: 20 mg via INTRAVENOUS
  Filled 2021-09-08: qty 1

## 2021-09-08 MED ORDER — POTASSIUM CHLORIDE 20 MEQ PO PACK
40.0000 meq | PACK | Freq: Once | ORAL | Status: AC
  Administered 2021-09-08: 40 meq via ORAL
  Filled 2021-09-08: qty 2

## 2021-09-08 MED ORDER — HYDRALAZINE HCL 20 MG/ML IJ SOLN
10.0000 mg | Freq: Four times a day (QID) | INTRAMUSCULAR | Status: DC | PRN
  Administered 2021-09-09: 20 mg via INTRAVENOUS
  Filled 2021-09-08: qty 1

## 2021-09-08 MED ORDER — LEVOTHYROXINE SODIUM 25 MCG PO TABS
125.0000 ug | ORAL_TABLET | Freq: Every day | ORAL | Status: DC
  Administered 2021-09-09 – 2021-09-17 (×9): 125 ug via ORAL
  Filled 2021-09-08 (×9): qty 1

## 2021-09-08 NOTE — Progress Notes (Signed)
Discussed with PCCM MD-Dr. Gabriel Rung will continue to be primary team today-if she is out of the [place] will assume primary service starting tomorrow. ?

## 2021-09-08 NOTE — Progress Notes (Signed)
? ?NAME:  Brooke Beasley, MRN:  [id number], DOB:  Feb 04, 1931, LOS: 1 ?ADMISSION DATE:  09/06/2021, CONSULTATION DATE:  5/15 ?REFERRING MD:  Dr. Sloan Leiter, CHIEF COMPLAINT:  N/V, decreased intake, hyponatremia  ? ?History of Present Illness:  ?88 y/o F who presented to [facility] ER on 5/14 with reports of decreased intake, nausea and vomiting.   ? ?At baseline, the patient has chronic hyponatremia with baseline Na+ ranging from 129-137. She recently has had L5-S1 discitis in the setting of proteus/citrobacter infection in 07/2021.  She was initially treated with IV zosyn and then transitioned to Augmentin.  In addition, she was recently started on chlorthalidone.  She takes zoloft for depression.   ? ?She presented 5/14 with c/o of n/v and decreased intake.  The patient was found to have a serum Na+ of 114 on presentation.  She was given NS in the ER with improvement to 117.  She subsequently drifted to 113.  Additional lab findings included a TSH of 18.420, cortisol 31.4, and low blood osmolality 242. Urine osmolality 347, urine Na 59.  Nephrology was consulted for low sodium and felt that the patient had SIADH.  Recommendations for hypertonic saline per Nephrology.   ? ?PCCM consulted for ICU transfer.  ? ?Pertinent  Medical History  ?Chronic Hyponatremia  ?CKD III ?HTN ?HLD  ?Carotid Stenosis  ?Mitral Regurgitation  ?Anxiety  ?Hx GIB  ?Diverticulosis  ?IBS ?Osteoporosis  ?Pseudogout  ?C-DIff  ?COVID - 09/2020 ? ?Significant Hospital Events: ?Including procedures, antibiotic start and stop dates in addition to other pertinent events   ?5/14 Admit  ?5/15 PCCM consulted for Hyponatremia  ? ?Interim History / Subjective:  ? ?Feeling much better today, sitting up in chair. She is a little hungry. Has not had any nausea or vomiting today.  ? ?Objective   ?Blood pressure 131/69, pulse (!) 59, temperature (!) 97.4 ?F (36.3 ?C), temperature source Oral, resp. rate 14, height '5\' 1"'$  (1.549 m), weight 69.6 kg, SpO2 96 %. ?   ?    ? ?Intake/Output Summary (Last 24 hours) at 09/08/2021 1146 ?Last data filed at 09/08/2021 0900 ?Gross per 24 hour  ?Intake 1112.79 ml  ?Output 800 ml  ?Net 312.79 ml  ? ?Filed Weights  ? 09/07/21 0329 09/07/21 2000 09/08/21 0159  ?Weight: 70.1 kg 69.6 kg 69.6 kg  ? ? ?Examination: ?General: pleasant elderly female, sitting up in chair, NAD. ?HENT: MM pink/moist, hard of hearing. ?CV: normal rate and irregular rhythm, no m/r/g. ?Pulm: CTABL, no adventitious sounds noted. Normal work of breathing on RA. ?Abdomen: soft, nondistended, normoactive bowel sounds. ?Extremities: warm and dry, no peripheral edema noted. ?Neuro: Alert and appropriately engaged during conversation, grossly nonfocal exam. Moves all extremities spontaneously. ? ?Resolved Hospital Problem list   ? ? ?Assessment & Plan:  ? ?Acute on Chronic Hyponatremia  ?Patient presented with severe hyponatremia with euvolemia. Given elevated urine sodium and osmolality, etiology is likely SiADH, which is likely multifactorial 2/2 recent history of lumbar discitis/prior abscess (s/p aspiration) and medications (chlorthalidone, zoloft). She does have mild chronic hyponatremia with sodium 129-132 at baseline. ?-greatly appreciate nephrology assistance ?-sodium 114 > 120 in past 24 hours ?-will stop hypertonic saline ?-added salt tabs 1g TID ?-fluid restriction 1221m ?-holding zoloft, chlorthalidone ?-morning cortisol wnl ?-elevated TSH but normal free T4 (thyroid labs likely erroneous in the setting of infection) ?-seizure precautions ?-trend q12 sodium  ?-if sodium continuing to improve, transfer out of ICU ?-PT/OT ? ?L5/S1 Discitis with Abscess s/p aspiration ?-continue unasyn  until 5/22 (can transition to augmentin once able) ? ?Sinus Bradycardia ?HTN ?Atrial Fibrillation ?Initially using lopressor for BP and HR control, but patient developed symptomatic bradycardia. Lopressor stopped and switched to hydralazine and imdur. ?-continue IV hydral and PO imdur for  BP control ?-telemetry ?-continue eliquis ? ?Hypokalemia  ?-monitor, replace as indicated  ? ? ?Best Practice (right click and "Reselect all SmartList Selections" daily)  ?Diet/type: Regular consistency (see orders) ?DVT prophylaxis: DOAC ?GI prophylaxis: PPI ?Lines: N/A ?Foley:  N/A ?Code Status:  DNR ?Last date of multidisciplinary goals of care discussion: DNR.  Patient updated on plan of care 5/15.  ? ?Critical care time: 34 minutes  ? ? ?Virl Axe, MD ?[facility] PGY-2 ?09/08/2021, 11:46 AM ? ?

## 2021-09-08 NOTE — Progress Notes (Signed)
[facility] ?NEPHROLOGY PROGRESS NOTE ? ?Assessment/ Plan: ?Pt is a 88 y.o. yo female  with history of hypertension, A-fib, chronic CHF, hypothyroidism, anxiety depression, recent history of L5-S1 discitis due to Proteus/Citrobacter infection in 07/2021 was on Zosyn and then switched to oral Augmentin, recently started chlorthalidone as  outpatient presented to the hospital with nausea vomiting, decreased oral intake, found to have severe hyponatremia.  Zoloft was discontinued and we are consulted for the evaluation. ? 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  She was initially intravascularly volume depleted in the setting of chlorthalidone and decreased oral intake. ?She initially received normal saline with worsening serum sodium level.  She was moved to ICU yesterday for 3% saline.  Sodium level improved to 120 today.  I will discontinue hypertonic saline further, repeat lab in the afternoon.  If serum sodium remains 120 or higher then no further need for hypertonic saline.  I will start oral salt tablet to increase solute.  Discussed about increasing oral intake of protein, solid food.  Continue fluid restriction to 1200 cc in 24 hours. ? Agree with holding Zoloft.  Discussed with ICU team. ?  ?#Hypokalemia: Repleted potassium chloride.  Improved. ?  ?#L5/S1 discitis/abscess: Currently on Augmentin till 5/22. ?  ?#History of C. difficile on prophylactic oral vancomycin while on antibiotics. ?  ?#Hypertension: BP acceptable continue metoprolol. ? ?Subjective: Seen and examined in ICU.  Patient was alert awake and eating breakfast.  Denies nausea, vomiting, chest pain or shortness of breath. ?Objective ?Vital signs in last 24 hours: ?Vitals:  ? 09/08/21 0500 09/08/21 0530 09/08/21 0600 09/08/21 0713  ?BP: 122/65 121/60 (!) 104/57   ?Pulse: (!) 52 (!) 58  63   ?Resp: '16 16 19   '$ ?Temp:    (!) 97.4 ?F (36.3 ?C)  ?TempSrc:    Oral  ?SpO2: 98% 98% 94%   ?Weight:      ?Height:      ? ?Weight change: -0.5 kg ? ?Intake/Output Summary (Last 24 hours) at 09/08/2021 0846 ?Last data filed at 09/08/2021 0600 ?Gross per 24 hour  ?Intake 874.43 ml  ?Output 1400 ml  ?Net -525.57 ml  ? ? ? ? ? ?Labs: ?Basic Metabolic Panel: ?Recent Labs  ?Lab 09/07/21 ?4540 09/07/21 ?1420 09/07/21 ?1858 09/08/21 ?0059 09/08/21 ?9811  ?NA 113* 115* 114* 116* 120*  ?K 3.1* 4.0  --  3.5  --   ?CL 79* 82*  --  85*  --   ?CO2 21* 22  --  20*  --   ?GLUCOSE 148* 152*  --  112*  --   ?BUN 10 11  --  9  --   ?CREATININE 0.81 0.77  --  0.66  --   ?CALCIUM 8.9 8.6*  --  8.2*  --   ? ?Liver Function Tests: ?No results for input(s): AST, ALT, ALKPHOS, BILITOT, PROT, ALBUMIN in the last 168 hours. ?No results for input(s): LIPASE, AMYLASE in the last 168 hours. ?No results for input(s): AMMONIA in the last 168 hours. ?CBC: ?Recent Labs  ?Lab 09/06/21 ?1716 09/08/21 ?0059  ?WBC 11.7* 9.0  ?HGB 14.6 13.1  ?HCT 43.0 37.4  ?MCV 83.5 83.7  ?PLT 357 318  ? ?Cardiac Enzymes: ?No results for input(s): CKTOTAL, CKMB, CKMBINDEX, TROPONINI in the last 168 hours. ?CBG: ?No results for input(s): GLUCAP in the last 168 hours. ? ?Iron Studies: No results for  input(s): IRON, TIBC, TRANSFERRIN, FERRITIN in the last 72 hours. ?Studies/Results: ?CT Head Wo Contrast ? ?Result Date: 09/06/2021 ?CLINICAL DATA:  Nausea and vomiting. EXAM: CT HEAD WITHOUT CONTRAST TECHNIQUE: Contiguous axial images were obtained from the base of the skull through the vertex without intravenous contrast. RADIATION DOSE REDUCTION: This exam was performed according to the departmental dose-optimization program which includes automated exposure control, adjustment of the mA and/or kV according to patient size and/or use of iterative reconstruction technique. COMPARISON:  January 14, 2021 FINDINGS: Brain: There is mild cerebral atrophy with widening of the  extra-axial spaces and ventricular dilatation. There are areas of decreased attenuation within the white matter tracts of the supratentorial brain, consistent with microvascular disease changes. A small chronic left cerebellar infarct is seen. Vascular: No hyperdense vessel or unexpected calcification. Skull: Normal. Negative for fracture or focal lesion. Sinuses/Orbits: No acute finding. Other: None. IMPRESSION: 1. No acute intracranial abnormality. 2. Generalized cerebral atrophy with chronic white matter small vessel ischemia. Electronically Signed   By: Virgina Norfolk M.D.   On: 09/06/2021 18:46  ? ?DG Chest Port 1 View ? ?Result Date: 09/06/2021 ?CLINICAL DATA:  Shortness of breath. EXAM: PORTABLE CHEST 1 VIEW COMPARISON:  08/04/2021 and older exams. FINDINGS: Mild stable enlargement of the cardiac silhouette. No mediastinal or hilar masses. Chronic interstitial thickening. Mild opacity at the left lung base consistent with a small effusion and atelectasis, decreased compared to the prior exam. No right pleural effusion. No pneumothorax. Skeletal structures are diffusely demineralized. Partly imaged left shoulder prosthesis, stable. IMPRESSION: 1. No acute cardiopulmonary disease. 2. Small left pleural effusion with associated atelectasis, decreased compared to the most recent prior study. 3. Chronic interstitial thickening and mild stable cardiomegaly. Electronically Signed   By: Lajean Manes M.D.   On: 09/06/2021 18:40   ? ?Medications: ?Infusions: ? ampicillin-sulbactam (UNASYN) IV Stopped (09/08/21 0445)  ? ? ?Scheduled Medications: ? apixaban  5 mg Oral BID  ? atropine      ? brimonidine  1 drop Both Eyes Daily  ? Chlorhexidine Gluconate Cloth  6 each Topical Daily  ? clonazePAM  0.5 mg Oral QHS  ? ferrous sulfate  325 mg Oral BID WC  ? isosorbide mononitrate  15 mg Oral QPM  ? latanoprost  1 drop Both Eyes QHS  ? levothyroxine  150 mcg Oral Daily  ? pantoprazole  40 mg Oral BID  ? prochlorperazine  5  mg Intravenous TID AC  ? sodium chloride  1 g Oral TID WC  ? vancomycin  125 mg Oral BID  ? ? have reviewed scheduled and prn medications. ? ?Physical Exam: ?General:NAD, comfortable ?Heart:RRR, s1s2 nl ?Lungs:clear b/l, no crackle ?Abdomen:soft, Non-tender, non-distended ?Extremities:No edema ?Neurology: Alert, awake and following commands ? ?Brooke Beasley Tanna Furry ?09/08/2021,8:46 AM ? LOS: 1 day  ? ?

## 2021-09-08 NOTE — Evaluation (Signed)
Occupational Therapy Evaluation ?Patient Details ?Name: Brooke Beasley ?MRN: [id number] ?DOB: 09-25-1930 ?Today's Date: 09/08/2021 ? ? ?History of Present Illness 88 year old white female presents to the ER 09/07/21 with several days of nausea, vomiting, weakness. +hyponatremia,  PMH atrial fibrillation, discitis after a perirectal abscess, hypertension, CKD stage IIIa, mitral regurg, hypothyroidism  ? ?Clinical Impression ?  ?PTA, was living with her daughter and required assistance for ADLs from aide (M-F 8-3); daughter assists with IADLs. Currently, pt requiring Min A for UB ADLs, Mod A for LB ADLs, and Min A for functional mobility with RW. Pt presenting with decreased activity tolerance as seen by fatigue with simple ADLs. Pt would benefit from further acute OT to address safety, independence, and activity tolerance with ADLs. Recommend dc to home with HHOT to optimize independence, safety, and return to PLOF.  ?   ? ?Recommendations for follow up therapy are one component of a multi-disciplinary discharge planning process, led by the attending physician.  Recommendations may be updated based on patient status, additional functional criteria and insurance authorization.  ? ?Follow Up Recommendations ? Home health OT  ?  ?Assistance Recommended at Discharge Frequent or constant Supervision/Assistance  ?Patient can return home with the following A little help with walking and/or transfers;A little help with bathing/dressing/bathroom ? ?  ?Functional Status Assessment ? Patient has had a recent decline in their functional status and demonstrates the ability to make significant improvements in function in a reasonable and predictable amount of time.  ?Equipment Recommendations ? None recommended by OT  ?  ?Recommendations for Other Services   ? ? ?  ?Precautions / Restrictions Precautions ?Precautions: Fall  ? ?  ? ?Mobility Bed Mobility ?Overal bed mobility: Needs Assistance ?Bed Mobility: Sit to Supine ?  ?   ?  ?Sit to supine: Supervision, HOB elevated ?  ?General bed mobility comments: supervision for safety with bringing LE back into bed ?  ? ?Transfers ?Overall transfer level: Needs assistance ?Equipment used: Rolling walker (2 wheels) ?Transfers: Sit to/from Stand ?Sit to Stand: Min assist ?  ?  ?  ?  ?  ?General transfer comment: light min A for steadying with power up to standing and stepping to [facility] ?  ? ?  ?Balance Overall balance assessment: Needs assistance ?Sitting-balance support: No upper extremity supported, Feet unsupported ?Sitting balance-Leahy Scale: Fair ?  ?  ?Standing balance support: Bilateral upper extremity supported, Reliant on assistive device for balance ?Standing balance-Leahy Scale: Poor ?Standing balance comment: requires assist for steadying while donning brief ?  ?  ?  ?  ?  ?  ?  ?  ?  ?  ?  ?   ? ?ADL either performed or assessed with clinical judgement  ? ?ADL Overall ADL's : Needs assistance/impaired ?Eating/Feeding: Set up;Sitting ?  ?Grooming: Dance movement psychotherapist;Set up;Supervision/safety;Sitting ?  ?Upper Body Bathing: Minimal assistance;Sitting ?  ?Lower Body Bathing: Moderate assistance;Sit to/from stand ?  ?Upper Body Dressing : Minimal assistance;Sitting ?  ?Lower Body Dressing: Moderate assistance;Sit to/from stand ?Lower Body Dressing Details (indicate cue type and reason): Mod A for donning depends at EOB ?Toilet Transfer: Minimal assistance;Ambulation;BSC/3in1 ?  ?Toileting- Clothing Manipulation and Hygiene: Moderate assistance;Sit to/from stand ?Toileting - Clothing Manipulation Details (indicate cue type and reason): Min A for balance while standing and performing anterior peri care. Assistance for posterior peri care ?  ?  ?Functional mobility during ADLs: Minimal assistance;Rolling walker (2 wheels) ?General ADL Comments: Pt very motivated. Presenting with decreased activity tolerance as  seen in fatigue  ? ? ? ?Vision   ?   ?   ?Perception   ?  ?Praxis   ?  ? ?Pertinent  Vitals/Pain Pain Assessment ?Pain Assessment: No/denies pain  ? ? ? ?Hand Dominance Left ?  ?Extremity/Trunk Assessment Upper Extremity Assessment ?Upper Extremity Assessment: Generalized weakness ?  ?Lower Extremity Assessment ?Lower Extremity Assessment: Defer to PT evaluation ?  ?Cervical / Trunk Assessment ?Cervical / Trunk Assessment: Normal ?  ?Communication Communication ?Communication: HOH ?  ?Cognition Arousal/Alertness: Awake/alert ?Behavior During Therapy: Flat affect ?Overall Cognitive Status: Difficult to assess ?  ?  ?  ?  ?  ?  ?  ?  ?  ?  ?  ?  ?  ?  ?  ?  ?General Comments: following commands with some repetition needed due to [facility] ?  ?  ?General Comments  Niece present throughout. BP 152/86, HR 64-70, pulse 63, RR17, SpO2 97% on RA ? ?  ?Exercises   ?  ?Shoulder Instructions    ? ? ?Home Living Family/patient expects to be discharged to:: Private residence ?Living Arrangements: Children ?Available Help at Discharge: Family;Available 24 hours/day ?Type of Home: House ?Home Access: Ramped entrance ?  ?  ?Home Layout: One level ?  ?  ?Bathroom Shower/Tub: Walk-in shower ?  ?Bathroom Toilet: Handicapped height ?Bathroom Accessibility: Yes ?  ?Home Equipment: Shower seat;Grab bars - tub/shower;Transport Oncologist (2 wheels);Rollator (4 wheels);Cane - quad;BSC/3in1;Grab bars - toilet (Adjustable bed) ?  ?Additional Comments: Daughter and SIL live with her ?  ? ?  ?Prior Functioning/Environment Prior Level of Function : Needs assist ?  ?  ?  ?  ?  ?  ?Mobility Comments: using RW inside home; someone has been with her recently while she walks due to illness; uses transport chair outside home ?ADLs Comments: home aide,  8A-3P M-F to assist with ADLs, dtr assists with IADLs including medication management, cooking, and driving ?  ? ?  ?  ?OT Problem List: Decreased strength;Decreased range of motion;Decreased activity tolerance;Impaired balance (sitting and/or standing);Decreased knowledge of use  of DME or AE;Decreased knowledge of precautions ?  ?   ?OT Treatment/Interventions: Self-care/ADL training;Therapeutic exercise;Energy conservation;DME and/or AE instruction;Therapeutic activities;Patient/family education  ?  ?OT Goals(Current goals can be found in the care plan section) Acute Rehab OT Goals ?Patient Stated Goal: Go home ?OT Goal Formulation: With patient/family ?Time For Goal Achievement: 09/22/21 ?Potential to Achieve Goals: Good  ?OT Frequency: Min 3X/week ?  ? ?Co-evaluation   ?  ?  ?  ?  ? ?  ?AM-PAC OT "6 Clicks" Daily Activity     ?Outcome Measure Help from another person eating meals?: A Little ?Help from another person taking care of personal grooming?: A Little ?Help from another person toileting, which includes using toliet, bedpan, or urinal?: A Lot ?Help from another person bathing (including washing, rinsing, drying)?: A Lot ?Help from another person to put on and taking off regular upper body clothing?: A Little ?Help from another person to put on and taking off regular lower body clothing?: A Lot ?6 Click Score: 15 ?  ?End of Session Equipment Utilized During Treatment: Rolling walker (2 wheels) ?Nurse Communication: Mobility status ? ?Activity Tolerance: Patient tolerated treatment well ?Patient left: in bed;with call bell/phone within reach;with bed alarm set;with family/visitor present ? ?OT Visit Diagnosis: Unsteadiness on feet (R26.81);Other abnormalities of gait and mobility (R26.89);Muscle weakness (generalized) (M62.81)  ?              ?  Time: 6803-2122 ?OT Time Calculation (min): 26 min ?Charges:  OT General Charges ?$OT Visit: 1 Visit ?OT Evaluation ?$OT Eval Moderate Complexity: 1 Mod ? ?Zondra Lawlor MSOT, OTR/L ?Acute Rehab ?Pager: [phone] ?Office: [phone] ? ?Rashea Hoskie M Esmond Hinch ?09/08/2021, 4:36 PM ?

## 2021-09-08 NOTE — Progress Notes (Signed)
Patient placed on CPAP 5 cmH2O via FFM with a 4 L O2 bleed in.  Patient is tolerating at this time.  ?

## 2021-09-08 NOTE — Progress Notes (Signed)
Physical Therapy Treatment ?Patient Details ?Name: Brooke Beasley ?MRN: [id number] ?DOB: 1930/11/02 ?Today's Date: 09/08/2021 ? ? ?History of Present Illness 88 year old white female presents to the ER 09/07/21 with several days of nausea, vomiting, weakness. +hyponatremia,  PMH atrial fibrillation, discitis after a perirectal abscess, hypertension, CKD stage IIIa, mitral regurg, hypothyroidism ? ?  ?PT Comments  ? ? Pt sitting up in recliner requesting to get back to bed as she has been up since 10 am. Pt asks to use BSC before return to bed as she is never sure when she needs to use bathroom. Pt overall min A for transfers and ambulation with RW. D/c plans remain appropriate at this time. PT will continue to follow acutely. ?   ?Recommendations for follow up therapy are one component of a multi-disciplinary discharge planning process, led by the attending physician.  Recommendations may be updated based on patient status, additional functional criteria and insurance authorization. ? ?Follow Up Recommendations ? Home health PT (may progress to no PT needed) ?  ?  ?Assistance Recommended at Discharge Intermittent Supervision/Assistance  ?Patient can return home with the following A little help with walking and/or transfers;A little help with bathing/dressing/bathroom;Assistance with cooking/housework;Help with stairs or ramp for entrance ?  ?Equipment Recommendations ? None recommended by PT  ?  ?Recommendations for Other Services OT consult ? ? ?  ?Precautions / Restrictions Precautions ?Precautions: Fall  ?  ? ?Mobility ? Bed Mobility ?Overal bed mobility: Needs Assistance ?Bed Mobility: Sit to Supine ?  ?  ?  ?Sit to supine: Supervision, HOB elevated ?  ?General bed mobility comments: supervision for safety with bringing LE back into bed ?  ? ?Transfers ?Overall transfer level: Needs assistance ?Equipment used: Rolling walker (2 wheels) ?Transfers: Sit to/from Stand, Bed to chair/wheelchair/BSC ?Sit to Stand:  Min assist ?  ?Step pivot transfers: Min assist ?  ?  ?  ?General transfer comment: light min A for steadying with power up to standing and stepping to BSC, min A for steadying with self pericare ?  ? ?Ambulation/Gait ?Ambulation/Gait assistance: Min assist ?Gait Distance (Feet): 10 Feet ?Assistive device: Rolling walker (2 wheels) ?Gait Pattern/deviations: Step-through pattern, Decreased step length - right, Decreased step length - left, Shuffle, Trunk flexed ?  ?  ?  ?General Gait Details: light min A for ambulation around bed to get in the other side, 1 x LoB requiring increased min A for steadying ? ? ? ? ?  ?Balance Overall balance assessment: Needs assistance ?Sitting-balance support: No upper extremity supported, Feet unsupported ?Sitting balance-Leahy Scale: Fair ?  ?  ?Standing balance support: Bilateral upper extremity supported, Reliant on assistive device for balance ?Standing balance-Leahy Scale: Poor ?Standing balance comment: requires assist for steadying while donning brief ?  ?  ?  ?  ?  ?  ?  ?  ?  ?  ?  ?  ? ?  ?Cognition Arousal/Alertness: Awake/alert ?Behavior During Therapy: Flat affect ?Overall Cognitive Status: Difficult to assess ?  ?  ?  ?  ?  ?  ?  ?  ?  ?  ?  ?  ?  ?  ?  ?  ?General Comments: following commands with some repetition needed due to [facility] ?  ?  ? ?  ?   ?General Comments General comments (skin integrity, edema, etc.): VSS on RA ?  ?  ? ?Pertinent Vitals/Pain Pain Assessment ?Pain Assessment: No/denies pain  ? ? ?Home Living   ?  ?  ?  ?  ?  ?  ?  ?  ?  Home Equipment: Shower seat;Grab bars - tub/shower;Transport Oncologist (2 wheels);Rollator (4 wheels);Cane - quad;BSC/3in1;Grab bars - toilet (Adjustable bed) ?   ?  ?   ? ?PT Goals (current goals can now be found in the care plan section) Acute Rehab PT Goals ?Patient Stated Goal: to feel better ?PT Goal Formulation: With patient ?Time For Goal Achievement: 09/21/21 ?Potential to Achieve Goals: Good ?Progress towards PT  goals: Progressing toward goals ? ?  ?Frequency ? ? ? Min 3X/week ? ? ? ?  ?PT Plan Current plan remains appropriate  ? ? ?   ?AM-PAC PT "6 Clicks" Mobility   ?Outcome Measure ? Help needed turning from your back to your side while in a flat bed without using bedrails?: None ?Help needed moving from lying on your back to sitting on the side of a flat bed without using bedrails?: None ?Help needed moving to and from a bed to a chair (including a wheelchair)?: A Little ?Help needed standing up from a chair using your arms (e.g., wheelchair or bedside chair)?: A Little ?Help needed to walk in hospital room?: A Little ?Help needed climbing 3-5 steps with a railing? : Total ?6 Click Score: 18 ? ?  ?End of Session Equipment Utilized During Treatment: Gait belt ?Activity Tolerance: Patient tolerated treatment well ?Patient left: in bed;with call bell/phone within reach;Other (comment) (OT in room for Evaluation) ?Nurse Communication: Mobility status;Other (comment) (leg pinched with use of BSC) ?PT Visit Diagnosis: Difficulty in walking, not elsewhere classified (R26.2);Muscle weakness (generalized) (M62.81) ?  ? ? ?Time: 6283-1517 ?PT Time Calculation (min) (ACUTE ONLY): 33 min ? ?Charges:  $Therapeutic Activity: 23-37 mins          ?          ? ?Talley Kreiser B. Migdalia Dk PT, DPT ?Acute Rehabilitation Services ?Please use secure chat or  ?Call Office [phone] ? ? ? ?[place] ?09/08/2021, 3:14 PM ? ?

## 2021-09-08 NOTE — Progress Notes (Signed)
Patient placed on CPAP of 5 cmH2O via FFM with a 4L O2 bleed in. Patient is tolerating at this time.  RT will continue to monitor.  ?

## 2021-09-09 ENCOUNTER — Encounter: Payer: Self-pay | Admitting: Internal Medicine

## 2021-09-09 ENCOUNTER — Encounter: Payer: Self-pay | Admitting: Family Medicine

## 2021-09-09 DIAGNOSIS — R531 Weakness: Secondary | ICD-10-CM | POA: Diagnosis not present

## 2021-09-09 DIAGNOSIS — E871 Hypo-osmolality and hyponatremia: Secondary | ICD-10-CM | POA: Diagnosis not present

## 2021-09-09 DIAGNOSIS — G4734 Idiopathic sleep related nonobstructive alveolar hypoventilation: Secondary | ICD-10-CM

## 2021-09-09 DIAGNOSIS — M4647 Discitis, unspecified, lumbosacral region: Secondary | ICD-10-CM | POA: Diagnosis not present

## 2021-09-09 DIAGNOSIS — I10 Essential (primary) hypertension: Secondary | ICD-10-CM | POA: Diagnosis not present

## 2021-09-09 LAB — RENAL FUNCTION PANEL
Albumin: 2.9 g/dL — ABNORMAL LOW (ref 3.5–5.0)
Anion gap: 11 (ref 5–15)
BUN: 10 mg/dL (ref 8–23)
CO2: 21 mmol/L — ABNORMAL LOW (ref 22–32)
Calcium: 8.8 mg/dL — ABNORMAL LOW (ref 8.9–10.3)
Chloride: 88 mmol/L — ABNORMAL LOW (ref 98–111)
Creatinine, Ser: 0.77 mg/dL (ref 0.44–1.00)
GFR, Estimated: >60 mL/min (ref >60)
Glucose, Bld: 151 mg/dL — ABNORMAL HIGH (ref 70–99)
Phosphorus: 2.1 mg/dL — ABNORMAL LOW (ref 2.5–4.6)
Potassium: 3.7 mmol/L (ref 3.5–5.1)
Sodium: 120 mmol/L — ABNORMAL LOW (ref 135–145)

## 2021-09-09 LAB — BASIC METABOLIC PANEL
Anion gap: 7 (ref 5–15)
BUN: 11 mg/dL (ref 8–23)
CO2: 24 mmol/L (ref 22–32)
Calcium: 9 mg/dL (ref 8.9–10.3)
Chloride: 90 mmol/L — ABNORMAL LOW (ref 98–111)
Creatinine, Ser: 0.93 mg/dL (ref 0.44–1.00)
GFR, Estimated: 59 mL/min — ABNORMAL LOW (ref >60)
Glucose, Bld: 183 mg/dL — ABNORMAL HIGH (ref 70–99)
Potassium: 3.4 mmol/L — ABNORMAL LOW (ref 3.5–5.1)
Sodium: 121 mmol/L — ABNORMAL LOW (ref 135–145)

## 2021-09-09 LAB — SODIUM
Sodium: 123 mmol/L — ABNORMAL LOW (ref 135–145)
Sodium: 123 mmol/L — ABNORMAL LOW (ref 135–145)

## 2021-09-09 MED ORDER — CHLORHEXIDINE GLUCONATE CLOTH 2 % EX PADS
6.0000 | MEDICATED_PAD | Freq: Every day | CUTANEOUS | Status: DC
  Administered 2021-09-09 (×2): 6 via TOPICAL

## 2021-09-09 MED ORDER — POTASSIUM CHLORIDE CRYS ER 20 MEQ PO TBCR
40.0000 meq | EXTENDED_RELEASE_TABLET | Freq: Once | ORAL | Status: AC
  Administered 2021-09-09: 40 meq via ORAL
  Filled 2021-09-09: qty 2

## 2021-09-09 MED ORDER — FUROSEMIDE 10 MG/ML IJ SOLN
20.0000 mg | Freq: Once | INTRAMUSCULAR | Status: AC
  Administered 2021-09-09: 20 mg via INTRAVENOUS
  Filled 2021-09-09: qty 2

## 2021-09-09 MED ORDER — AMOXICILLIN-POT CLAVULANATE 875-125 MG PO TABS
1.0000 | ORAL_TABLET | Freq: Two times a day (BID) | ORAL | Status: AC
  Administered 2021-09-09 – 2021-09-14 (×12): 1 via ORAL
  Filled 2021-09-09 (×12): qty 1

## 2021-09-09 MED ORDER — DICLOFENAC SODIUM 1 % EX GEL
2.0000 g | Freq: Every day | CUTANEOUS | Status: DC | PRN
  Administered 2021-09-10: 2 g via TOPICAL
  Filled 2021-09-09: qty 100

## 2021-09-09 NOTE — Progress Notes (Addendum)
[facility] ?NEPHROLOGY PROGRESS NOTE ? ?Assessment/ Plan: ?Pt is a 88 y.o. yo female  with history of hypertension, A-fib, chronic CHF, hypothyroidism, anxiety depression, recent history of L5-S1 discitis due to Proteus/Citrobacter infection in 07/2021 was on Zosyn and then switched to oral Augmentin, recently started chlorthalidone as  outpatient presented to the hospital with nausea vomiting, decreased oral intake, found to have severe hyponatremia.  Zoloft was discontinued and we are consulted for the evaluation. ? 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  She was initially intravascularly volume depleted in the setting of chlorthalidone and decreased oral intake. ?Sodium level improved with hypertonic 3% saline, required for about 12 hours.  The serum sodium level 123 this morning.  Clinically asymptomatic.  I will continue salt tablet, fluid restriction and repeat lab today.  Please discontinue SSRI, thiazide diuretics. ?I have discussed with the patient's daughter who was present at the bedside. ?  ?#Hypokalemia: Repleted potassium chloride.  Improved. ?  ?#L5/S1 discitis/abscess: Currently on Augmentin till 5/22. ?  ?#History of C. difficile on prophylactic oral vancomycin while on antibiotics. ?  ?#Hypertension: BP acceptable continue metoprolol. ? ?Addendum 2:40 PM: ?Repeat Na level worsen to 120. Will order a dose of lasix 20 mg IV, will try to keep pt in ICU tonight. D/w primary team Dr. Sloan Leiter. Monitor lab. ? ?Subjective: Seen and examined in ICU.  No new event.  Denies nausea, vomiting, chest pain or shortness of breath.  Eating breakfast.  Her daughter was present at the bedside. ?Objective ?Vital signs in last 24 hours: ?Vitals:  ? 09/09/21 0505 09/09/21 0535 09/09/21 0600 09/09/21 0746  ?BP: (!) 198/73  117/68   ?Pulse:  87 85   ?Resp:  19  20   ?Temp:    (!) 97.4 ?F (36.3 ?C)  ?TempSrc:    Oral  ?SpO2:  96% 95%   ?Weight:      ?Height:      ? ?Weight change: 1.4 kg ? ?Intake/Output Summary (Last 24 hours) at 09/09/2021 0932 ?Last data filed at 09/09/2021 0600 ?Gross per 24 hour  ?Intake 1139.96 ml  ?Output 1250 ml  ?Net -110.04 ml  ? ? ? ? ? ? ?Labs: ?Basic Metabolic Panel: ?Recent Labs  ?Lab 09/07/21 ?1420 09/07/21 ?1858 09/08/21 ?0059 09/08/21 ?6712 09/08/21 ?1032 09/08/21 ?1406 09/08/21 ?2220 09/09/21 ?0222 09/09/21 ?4580  ?NA 115*   < > 116*   < > 121*   < > 122* 123* 123*  ?K 4.0  --  3.5  --  3.5  --   --   --   --   ?CL 82*  --  85*  --  89*  --   --   --   --   ?CO2 22  --  20*  --  23  --   --   --   --   ?GLUCOSE 152*  --  112*  --  122*  --   --   --   --   ?BUN 11  --  9  --  11  --   --   --   --   ?CREATININE 0.77  --  0.66  --  0.75  --   --   --   --   ?CALCIUM 8.6*  --  8.2*  --  8.7*  --   --   --   --   ? < > =  values in this interval not displayed.  ? ? ?Liver Function Tests: ?No results for input(s): AST, ALT, ALKPHOS, BILITOT, PROT, ALBUMIN in the last 168 hours. ?No results for input(s): LIPASE, AMYLASE in the last 168 hours. ?No results for input(s): AMMONIA in the last 168 hours. ?CBC: ?Recent Labs  ?Lab 09/06/21 ?1716 09/08/21 ?0059  ?WBC 11.7* 9.0  ?HGB 14.6 13.1  ?HCT 43.0 37.4  ?MCV 83.5 83.7  ?PLT 357 318  ? ? ?Cardiac Enzymes: ?No results for input(s): CKTOTAL, CKMB, CKMBINDEX, TROPONINI in the last 168 hours. ?CBG: ?No results for input(s): GLUCAP in the last 168 hours. ? ?Iron Studies: No results for input(s): IRON, TIBC, TRANSFERRIN, FERRITIN in the last 72 hours. ?Studies/Results: ?No results found. ? ?Medications: ?Infusions: ? ? ? ?Scheduled Medications: ? amoxicillin-clavulanate  1 tablet Oral Q12H  ? apixaban  5 mg Oral BID  ? brimonidine  1 drop Both Eyes Daily  ? Chlorhexidine Gluconate Cloth  6 each Topical Q2000  ? clonazePAM  0.5 mg Oral QHS  ? ferrous sulfate  325 mg Oral BID WC  ? isosorbide mononitrate  15  mg Oral QPM  ? latanoprost  1 drop Both Eyes QHS  ? levothyroxine  125 mcg Oral Daily  ? pantoprazole  40 mg Oral BID  ? prochlorperazine  5 mg Intravenous TID AC  ? sodium chloride  1 g Oral TID WC  ? vancomycin  125 mg Oral BID  ? ? have reviewed scheduled and prn medications. ? ?Physical Exam: ?General: Sitting on bed comfortable, not in distress ?Heart:RRR, s1s2 nl ?Lungs:clear b/l, no crackle ?Abdomen:soft, Non-tender, non-distended ?Extremities:No edema ?Neurology: Alert, awake and following commands ? ?Brooke Beasley ?09/09/2021,8:22 AM ? LOS: 2 days  ? ?

## 2021-09-09 NOTE — Progress Notes (Addendum)
?      ?                 PROGRESS NOTE ? ?      ?PATIENT DETAILS ?Name: Brooke Beasley ?Age: 88 y.o. ?Sex: female ?Date of Birth: 24-Apr-1931 ?Admit Date: 09/06/2021 ?Admitting Physician Shela Leff, MD ?[id number], Garlon Hatchet, MD ? ?Brief Summary: ?Patient is a 88 y.o.  female with recent history of L5-S1 discitis (Proteus/Citrobacter)-April 2023-no longer on IV Zosyn-now on oral Augmentin until 5/22-recently started on chlorthalidone by outpatient MD-presented to the hospital with 4-day history of nausea/poor oral intake (mostly consuming liquids)-found to have severe hyponatremia and subsequently admitted to the hospitalist service. ? ? ?Significant events: ?3/30-4/13>> hospitalization for L5-S1 discitis/abscess posterior to rectum-cultures Proteus/Citrobacter-discharged on IV Zosyn-stop date 5/22.  New onset A-fib-started on anticoagulation. ?5/03>> outpatient MRI-resolution of L5-S1 epidural abscess, persistent L5-S1 discitis/osteomyelitis. ?5/11>> due to persistent leukocytosis-intermittent fevers-PICC line removed on 5/5-transition to Augmentin by ID in the outpatient setting. ?5/14>> admit for hyponatremia. ?5/15>> no response to IVF-renal consult-to ICU for 3% NaCl.   ?5/17 >> transfer back to [facility]-off 3% NaCl since 5/16.  ? ?Significant studies: ?5/14>> CXR: No PNA ?5/14>> CT head: No acute abnormality. ?5/15>> TSH: 18.4 ?5/15>> cortisol a.m.: 31.4 ? ?Significant microbiology data: ? ? ?Procedures: ? ? ?Consults: ?Nephrology, PCCM ? ?Subjective: ?Looks a lot better than last time I saw-no nausea-tolerating diet. ? ?Objective: ?Vitals: ?Blood pressure 117/68, pulse 85, temperature (!) 97.4 ?F (36.3 ?C), temperature source Oral, resp. rate 20, height '5\' 1"'$  (1.549 m), weight 71 kg, SpO2 95 %.  ? ?Exam: ?Gen Exam:Alert awake-not in any distress ?HEENT:atraumatic, normocephalic ?Chest: B/L clear to auscultation anteriorly ?CVS:S1S2 regular ?Abdomen:soft non tender, non distended ?Extremities:no  edema ?Neurology: Non focal ?Skin: no rash  ? ?Pertinent Labs/Radiology: ? ?  Latest Ref Rng & Units 09/08/2021  ? 12:59 AM 09/06/2021  ?  5:16 PM 08/26/2021  ?  3:45 PM  ?CBC  ?WBC 4.0 - 10.5 K/uL 9.0   11.7   13.6    ?Hemoglobin 12.0 - 15.0 g/dL 13.1   14.6   12.0    ?Hematocrit 36.0 - 46.0 % 37.4   43.0   38.1    ?Platelets 150 - 400 K/uL 318   357   293    ?  ?Lab Results  ?Component Value Date  ? NA 123 (L) 09/09/2021  ? K 3.5 09/08/2021  ? CL 89 (L) 09/08/2021  ? CO2 23 09/08/2021  ? ?  ? ? ?Assessment/Plan: ?Hyponatremia: Multifactorial etiology-but suspect this is mostly SIADH due to nausea and Zoloft use.  Of 3% saline-sodium levels slowly increasing-continue fluid restriction and salt tablets-nephrology following. ? ?Intractable nausea: Significant improvement-unclear etiology-initially thought to be from Augmentin-was switched to Unasyn-continue scheduled Compazine for 1 additional day-we will switch to as needed dosing from tomorrow.  Has been restarted on Augmentin.   ? ?L 5-S1 discitis/abscess: Recent scans reassuring-on antimicrobial therapy until 5/22. ? ?History of C. difficile: On prophylactic oral vancomycin as patient is on antibiotics for discitis. ? ?HTN: BP reasonable-continue metoprolol. ? ?Persistent atrial fibrillation: Continue Lopressor-on Eliquis. ? ?Chronic HFpEF: Volume status stable. ? ?Aortic valve stenosis: Stable for outpatient monitoring with periodic echo ? ?Hypothyroidism: TSH significantly elevated-Synthroid has been increased to 150 mcg.  Repeat TSH in 3 months. ? ?Anxiety/depression: Holding Zoloft-see above-continue Klonopin. ? ??  OSA: Started on CPAP while in the ICU-discussed with patient/family at bedside-we will need outpatient sleep study. ? ?BMI: ?Estimated body mass index  is 29.58 kg/m? as calculated from the following: ?  Height as of this encounter: '5\' 1"'$  (1.549 m). ?  Weight as of this encounter: 71 kg.  ? ?Code status: ?  Code Status: DNR  ? ?DVT Prophylaxis: ?SCDs  Start: 09/07/21 0430 ?apixaban (ELIQUIS) tablet 5 mg   ? ?Family Communication: Son-in-law at bedside on 5/17. ? ? ?Disposition Plan: ?Status is: Inpatient ?Remains inpatient appropriate because: Improving hyponatremia-noted stable for discharge. ?  ?Planned Discharge Destination:Home health ? ? ?Diet: ?Diet Order   ? ?       ?  Diet regular Room service appropriate? Yes; Fluid consistency: Thin; Fluid restriction: 1200 mL Fluid  Diet effective now       ?  ? ?  ?  ? ?  ?  ? ? ?Antimicrobial agents: ?Anti-infectives (From admission, onward)  ? ? Start     Dose/Rate Route Frequency Ordered Stop  ? 09/09/21 1000  amoxicillin-clavulanate (AUGMENTIN) 875-125 MG per tablet 1 tablet       ? 1 tablet Oral Every 12 hours 09/09/21 0738 09/15/21 0959  ? 09/07/21 2100  Ampicillin-Sulbactam (UNASYN) 3 g in sodium chloride 0.9 % 100 mL IVPB  Status:  Discontinued       ? 3 g ?200 mL/hr over 30 Minutes Intravenous Every 8 hours 09/07/21 1013 09/09/21 0738  ? 09/07/21 1000  vancomycin (VANCOCIN) capsule 125 mg       ? 125 mg Oral 2 times daily 09/06/21 2332 09/22/21 0959  ? 09/07/21 1000  amoxicillin-clavulanate (AUGMENTIN) 875-125 MG per tablet 1 tablet  Status:  Discontinued       ? 1 tablet Oral Every 12 hours 09/07/21 0429 09/07/21 0949  ? 09/06/21 2200  piperacillin-tazobactam (ZOSYN) IVPB 3.375 g  Status:  Discontinued       ? 3.375 g ?12.5 mL/hr over 240 Minutes Intravenous Every 8 hours 09/06/21 2124 09/07/21 0428  ? ?  ? ? ? ?MEDICATIONS: ?Scheduled Meds: ? amoxicillin-clavulanate  1 tablet Oral Q12H  ? apixaban  5 mg Oral BID  ? brimonidine  1 drop Both Eyes Daily  ? Chlorhexidine Gluconate Cloth  6 each Topical Q2000  ? clonazePAM  0.5 mg Oral QHS  ? ferrous sulfate  325 mg Oral BID WC  ? isosorbide mononitrate  15 mg Oral QPM  ? latanoprost  1 drop Both Eyes QHS  ? levothyroxine  125 mcg Oral Daily  ? pantoprazole  40 mg Oral BID  ? prochlorperazine  5 mg Intravenous TID AC  ? sodium chloride  1 g Oral TID WC  ?  vancomycin  125 mg Oral BID  ? ?Continuous Infusions: ? ? ?PRN Meds:.acetaminophen **OR** acetaminophen, atropine, bisacodyl, hydrALAZINE, ondansetron **OR** ondansetron (ZOFRAN) IV ? ? ?I have personally reviewed following labs and imaging studies ? ?LABORATORY DATA: ?CBC: ?Recent Labs  ?Lab 09/06/21 ?1716 09/08/21 ?0059  ?WBC 11.7* 9.0  ?HGB 14.6 13.1  ?HCT 43.0 37.4  ?MCV 83.5 83.7  ?PLT 357 318  ? ? ? ?Basic Metabolic Panel: ?Recent Labs  ?Lab 09/07/21 ?0431 09/07/21 ?9373 09/07/21 ?1420 09/07/21 ?1858 09/08/21 ?0059 09/08/21 ?4287 09/08/21 ?1032 09/08/21 ?1406 09/08/21 ?1820 09/08/21 ?2220 09/09/21 ?0222 09/09/21 ?6811  ?NA 117* 113* 115*   < > 116*   < > 121* 123* 121* 122* 123* 123*  ?K 3.3* 3.1* 4.0  --  3.5  --  3.5  --   --   --   --   --   ?CL 82* 79* 82*  --  85*  --  89*  --   --   --   --   --   ?CO2 23 21* 22  --  20*  --  23  --   --   --   --   --   ?GLUCOSE 133* 148* 152*  --  112*  --  122*  --   --   --   --   --   ?BUN '10 10 11  '$ --  9  --  11  --   --   --   --   --   ?CREATININE 0.84 0.81 0.77  --  0.66  --  0.75  --   --   --   --   --   ?CALCIUM 9.1 8.9 8.6*  --  8.2*  --  8.7*  --   --   --   --   --   ? < > = values in this interval not displayed.  ? ? ? ?GFR: ?Estimated Creatinine Clearance: 43.8 mL/min (by C-G formula based on SCr of 0.75 mg/dL). ? ?Liver Function Tests: ?No results for input(s): AST, ALT, ALKPHOS, BILITOT, PROT, ALBUMIN in the last 168 hours. ?No results for input(s): LIPASE, AMYLASE in the last 168 hours. ?No results for input(s): AMMONIA in the last 168 hours. ? ?Coagulation Profile: ?No results for input(s): INR, PROTIME in the last 168 hours. ? ?Cardiac Enzymes: ?No results for input(s): CKTOTAL, CKMB, CKMBINDEX, TROPONINI in the last 168 hours. ? ?BNP (last 3 results) ?Recent Labs  ?  02/10/21 ?1313  ?PROBNP 165.0*  ? ? ? ?Lipid Profile: ?No results for input(s): CHOL, HDL, LDLCALC, TRIG, CHOLHDL, LDLDIRECT in the last 72 hours. ? ?Thyroid Function Tests: ?Recent  Labs  ?  09/07/21 ?0431  ?TSH 18.420*  ?FREET4 0.96  ? ? ? ?Anemia Panel: ?No results for input(s): VITAMINB12, FOLATE, FERRITIN, TIBC, IRON, RETICCTPCT in the last 72 hours. ? ?Urine analysis: ?   ?Component Value

## 2021-09-10 DIAGNOSIS — R531 Weakness: Secondary | ICD-10-CM | POA: Diagnosis not present

## 2021-09-10 DIAGNOSIS — E871 Hypo-osmolality and hyponatremia: Secondary | ICD-10-CM | POA: Diagnosis not present

## 2021-09-10 DIAGNOSIS — M4647 Discitis, unspecified, lumbosacral region: Secondary | ICD-10-CM | POA: Diagnosis not present

## 2021-09-10 DIAGNOSIS — I10 Essential (primary) hypertension: Secondary | ICD-10-CM | POA: Diagnosis not present

## 2021-09-10 LAB — RENAL FUNCTION PANEL
Albumin: 2.8 g/dL — ABNORMAL LOW (ref 3.5–5.0)
Anion gap: 9 (ref 5–15)
BUN: 13 mg/dL (ref 8–23)
CO2: 23 mmol/L (ref 22–32)
Calcium: 9 mg/dL (ref 8.9–10.3)
Chloride: 92 mmol/L — ABNORMAL LOW (ref 98–111)
Creatinine, Ser: 0.81 mg/dL (ref 0.44–1.00)
GFR, Estimated: >60 mL/min (ref >60)
Glucose, Bld: 145 mg/dL — ABNORMAL HIGH (ref 70–99)
Phosphorus: 2.1 mg/dL — ABNORMAL LOW (ref 2.5–4.6)
Potassium: 4.4 mmol/L (ref 3.5–5.1)
Sodium: 124 mmol/L — ABNORMAL LOW (ref 135–145)

## 2021-09-10 MED ORDER — DICLOFENAC SODIUM 1 % EX GEL: 2.0000 g | Freq: Four times a day (QID) | CUTANEOUS | Status: DC | PRN

## 2021-09-10 MED ORDER — ACETAMINOPHEN 500 MG PO TABS
1000.0000 mg | ORAL_TABLET | Freq: Three times a day (TID) | ORAL | Status: DC
  Administered 2021-09-10 – 2021-09-17 (×21): 1000 mg via ORAL
  Filled 2021-09-10 (×21): qty 2

## 2021-09-10 MED ORDER — PROCHLORPERAZINE EDISYLATE 10 MG/2ML IJ SOLN: 5.0000 mg | Freq: Four times a day (QID) | INTRAMUSCULAR | Status: DC | PRN

## 2021-09-10 MED ORDER — HYDROMORPHONE HCL 1 MG/ML IJ SOLN
0.5000 mg | INTRAMUSCULAR | Status: AC
  Administered 2021-09-10: 0.5 mg via INTRAVENOUS
  Filled 2021-09-10: qty 0.5

## 2021-09-10 MED ORDER — ORAL CARE MOUTH RINSE
15.0000 mL | Freq: Two times a day (BID) | OROMUCOSAL | Status: DC
  Administered 2021-09-11 – 2021-09-16 (×7): 15 mL via OROMUCOSAL

## 2021-09-10 MED ORDER — FUROSEMIDE 10 MG/ML IJ SOLN
20.0000 mg | Freq: Once | INTRAMUSCULAR | Status: AC
  Administered 2021-09-10: 20 mg via INTRAVENOUS
  Filled 2021-09-10: qty 2

## 2021-09-10 MED ORDER — KETOROLAC TROMETHAMINE 15 MG/ML IJ SOLN: 15.0000 mg | Freq: Three times a day (TID) | INTRAMUSCULAR | Status: AC | PRN

## 2021-09-10 MED ORDER — CHLORHEXIDINE GLUCONATE 0.12 % MT SOLN
15.0000 mL | Freq: Two times a day (BID) | OROMUCOSAL | Status: DC
  Administered 2021-09-10 – 2021-09-17 (×15): 15 mL via OROMUCOSAL
  Filled 2021-09-10 (×15): qty 15

## 2021-09-10 NOTE — Progress Notes (Signed)
PT Cancellation Note  Patient Details Name: Brooke Beasley MRN: [id number] DOB: Sep 14, 1930   Cancelled Treatment:    Reason Eval/Treat Not Completed: Other (comment). Pt and family member report she had received her pain meds last night and remains groggy and asked to defer until tomorrow.    Shary Decamp [facility] 09/10/2021, 3:05 PM [place] Office [phone]

## 2021-09-10 NOTE — Progress Notes (Signed)
PROGRESS NOTE        PATIENT DETAILS Name: Brooke Beasley Age: 88 y.o. Sex: female Date of Birth: 1931-01-18 Admit Date: 09/06/2021 Admitting Physician Shela Leff, MD [id number], Garlon Hatchet, MD  Brief Summary: Patient is a 88 y.o.  female with recent history of L5-S1 discitis (Proteus/Citrobacter)-April 2023-no longer on IV Zosyn-now on oral Augmentin until 5/22-recently started on chlorthalidone by outpatient MD-presented to the hospital with 4-day history of nausea/poor oral intake (mostly consuming liquids)-found to have severe hyponatremia and subsequently admitted to the hospitalist service.   Significant events: 3/30-4/13>> hospitalization for L5-S1 discitis/abscess posterior to rectum-cultures Proteus/Citrobacter-discharged on IV Zosyn-stop date 5/22.  New onset A-fib-started on anticoagulation. 5/03>> outpatient MRI-resolution of L5-S1 epidural abscess, persistent L5-S1 discitis/osteomyelitis. 5/11>> due to persistent leukocytosis-intermittent fevers-PICC line removed on 5/5-transition to Augmentin by ID in the outpatient setting. 5/14>> admit for hyponatremia. 5/15>> no response to IVF-renal consult-to ICU for 3% NaCl.   5/17 >> transfer back to [facility]-off 3% NaCl since 5/16.   Significant studies: 5/14>> CXR: No PNA 5/14>> CT head: No acute abnormality. 5/15>> TSH: 18.4 5/15>> cortisol a.m.: 31.4  Significant microbiology data:   Procedures:   Consults: Nephrology, PCCM  Subjective: Had bilateral shoulder pain-received Dilaudid earlier this morning-little lethargic but no major issues overnight.  Objective: Vitals: Blood pressure (!) 150/90, pulse 63, temperature 99.3 F (37.4 C), temperature source Axillary, resp. rate 20, height '5\' 1"'$  (1.549 m), weight 72.9 kg, SpO2 (!) 87 %.   Exam: Gen Exam:Alert awake-not in any distress HEENT:atraumatic, normocephalic Chest: B/L clear to auscultation anteriorly CVS:S1S2  regular Abdomen:soft non tender, non distended Extremities:no edema Neurology: Non focal Skin: no rash   Pertinent Labs/Radiology:    Latest Ref Rng & Units 09/08/2021   12:59 AM 09/06/2021    5:16 PM 08/26/2021    3:45 PM  CBC  WBC 4.0 - 10.5 K/uL 9.0   11.7   13.6    Hemoglobin 12.0 - 15.0 g/dL 13.1   14.6   12.0    Hematocrit 36.0 - 46.0 % 37.4   43.0   38.1    Platelets 150 - 400 K/uL 318   357   293      Lab Results  Component Value Date   NA 124 (L) 09/10/2021   K 4.4 09/10/2021   CL 92 (L) 09/10/2021   CO2 23 09/10/2021       Assessment/Plan: Hyponatremia: Multifactorial etiology-but suspect this is mostly SIADH due to nausea and Zoloft use.  Sodium level slowly improving-received IV Lasix yesterday-sodium levels up to 124-plan is to repeat a dose of IV Lasix today-continue salt tablets.  Maintain fluid restriction.  Intractable nausea: Significant improvement-unclear etiology-significantly better-switch to as needed Compazine today.  L 5-S1 discitis/abscess: Recent scans reassuring-on antimicrobial therapy until 5/22.  History of C. difficile: On prophylactic oral vancomycin as patient is on antibiotics for discitis.  HTN: BP reasonable-continue metoprolol, Imdur.  No plans to resume chlorthalidone on discharge given severity of hyponatremia.  Persistent atrial fibrillation: Continue Lopressor-on Eliquis.  Chronic HFpEF: Volume status stable.  Aortic valve stenosis: Stable for outpatient monitoring with periodic echo  Hypothyroidism: TSH significantly elevated-Synthroid has been increased to 150 mcg.  Repeat TSH in 3 months.  Anxiety/depression: Holding Zoloft due to possible contribution to SIADH physiology causing hyponatremia-see above-continue Klonopin.  ?  OSA: Started on CPAP while in the  ICU-discussed with patient/family at bedside-we will need outpatient sleep study.  BMI: Estimated body mass index is 30.37 kg/m as calculated from the following:    Height as of this encounter: '5\' 1"'$  (1.549 m).   Weight as of this encounter: 72.9 kg.   Code status:   Code Status: DNR   DVT Prophylaxis: SCDs Start: 09/07/21 0430 apixaban (ELIQUIS) tablet 5 mg    Family Communication: Daughter at bedside on 5/18   Disposition Plan: Status is: Inpatient Remains inpatient appropriate because: Improving hyponatremia-noted stable for discharge.   Planned Discharge Destination:Home health   Diet: Diet Order             Diet regular Room service appropriate? Yes; Fluid consistency: Thin; Fluid restriction: 1200 mL Fluid  Diet effective now                     Antimicrobial agents: Anti-infectives (From admission, onward)    Start     Dose/Rate Route Frequency Ordered Stop   09/09/21 1000  amoxicillin-clavulanate (AUGMENTIN) 875-125 MG per tablet 1 tablet        1 tablet Oral Every 12 hours 09/09/21 0738 09/15/21 0959   09/07/21 2100  Ampicillin-Sulbactam (UNASYN) 3 g in sodium chloride 0.9 % 100 mL IVPB  Status:  Discontinued        3 g 200 mL/hr over 30 Minutes Intravenous Every 8 hours 09/07/21 1013 09/09/21 0738   09/07/21 1000  vancomycin (VANCOCIN) capsule 125 mg        125 mg Oral 2 times daily 09/06/21 2332 09/22/21 0959   09/07/21 1000  amoxicillin-clavulanate (AUGMENTIN) 875-125 MG per tablet 1 tablet  Status:  Discontinued        1 tablet Oral Every 12 hours 09/07/21 0429 09/07/21 0949   09/06/21 2200  piperacillin-tazobactam (ZOSYN) IVPB 3.375 g  Status:  Discontinued        3.375 g 12.5 mL/hr over 240 Minutes Intravenous Every 8 hours 09/06/21 2124 09/07/21 0428        MEDICATIONS: Scheduled Meds:  amoxicillin-clavulanate  1 tablet Oral Q12H   apixaban  5 mg Oral BID   brimonidine  1 drop Both Eyes Daily   chlorhexidine  15 mL Mouth Rinse BID   Chlorhexidine Gluconate Cloth  6 each Topical Q2000   clonazePAM  0.5 mg Oral QHS   ferrous sulfate  325 mg Oral BID WC   furosemide  20 mg Intravenous Once    isosorbide mononitrate  15 mg Oral QPM   latanoprost  1 drop Both Eyes QHS   levothyroxine  125 mcg Oral Daily   mouth rinse  15 mL Mouth Rinse q12n4p   pantoprazole  40 mg Oral BID   prochlorperazine  5 mg Intravenous TID AC   sodium chloride  1 g Oral TID WC   vancomycin  125 mg Oral BID   Continuous Infusions:   PRN Meds:.acetaminophen **OR** acetaminophen, atropine, bisacodyl, diclofenac Sodium, hydrALAZINE, ondansetron **OR** ondansetron (ZOFRAN) IV   I have personally reviewed following labs and imaging studies  LABORATORY DATA: CBC: Recent Labs  Lab 09/06/21 1716 09/08/21 0059  WBC 11.7* 9.0  HGB 14.6 13.1  HCT 43.0 37.4  MCV 83.5 83.7  PLT 357 318     Basic Metabolic Panel: Recent Labs  Lab 09/08/21 0059 09/08/21 0614 09/08/21 1032 09/08/21 1406 09/09/21 0222 09/09/21 0613 09/09/21 1047 09/09/21 1909 09/10/21 0048  NA 116*   < > 121*   < > 123* 123*  120* 121* 124*  K 3.5  --  3.5  --   --   --  3.7 3.4* 4.4  CL 85*  --  89*  --   --   --  88* 90* 92*  CO2 20*  --  23  --   --   --  21* 24 23  GLUCOSE 112*  --  122*  --   --   --  151* 183* 145*  BUN 9  --  11  --   --   --  '10 11 13  '$ CREATININE 0.66  --  0.75  --   --   --  0.77 0.93 0.81  CALCIUM 8.2*  --  8.7*  --   --   --  8.8* 9.0 9.0  PHOS  --   --   --   --   --   --  2.1*  --  2.1*   < > = values in this interval not displayed.     GFR: Estimated Creatinine Clearance: 43.8 mL/min (by C-G formula based on SCr of 0.81 mg/dL).  Liver Function Tests: Recent Labs  Lab 09/09/21 1047 09/10/21 0048  ALBUMIN 2.9* 2.8*   No results for input(s): LIPASE, AMYLASE in the last 168 hours. No results for input(s): AMMONIA in the last 168 hours.  Coagulation Profile: No results for input(s): INR, PROTIME in the last 168 hours.  Cardiac Enzymes: No results for input(s): CKTOTAL, CKMB, CKMBINDEX, TROPONINI in the last 168 hours.  BNP (last 3 results) Recent Labs    02/10/21 1313  PROBNP  165.0*     Lipid Profile: No results for input(s): CHOL, HDL, LDLCALC, TRIG, CHOLHDL, LDLDIRECT in the last 72 hours.  Thyroid Function Tests: No results for input(s): TSH, T4TOTAL, FREET4, T3FREE, THYROIDAB in the last 72 hours.   Anemia Panel: No results for input(s): VITAMINB12, FOLATE, FERRITIN, TIBC, IRON, RETICCTPCT in the last 72 hours.  Urine analysis:    Component Value Date/Time   COLORURINE YELLOW 09/06/2021 1853   APPEARANCEUR CLEAR 09/06/2021 1853   LABSPEC 1.011 09/06/2021 1853   PHURINE 6.5 09/06/2021 1853   GLUCOSEU NEGATIVE 09/06/2021 1853   HGBUR NEGATIVE 09/06/2021 1853   HGBUR trace-lysed 06/11/2010 0822   BILIRUBINUR NEGATIVE 09/06/2021 1853   BILIRUBINUR negative 10/17/2017 1111   KETONESUR NEGATIVE 09/06/2021 1853   PROTEINUR NEGATIVE 09/06/2021 1853   UROBILINOGEN 0.2 10/17/2017 1111   UROBILINOGEN 0.2 04/08/2012 1221   NITRITE NEGATIVE 09/06/2021 1853   LEUKOCYTESUR NEGATIVE 09/06/2021 1853    Sepsis Labs: Lactic Acid, Venous No results found for: LATICACIDVEN  MICROBIOLOGY: Recent Results (from the past 240 hour(s))  MRSA Next Gen by PCR, Nasal     Status: None   Collection Time: 09/07/21  6:31 PM   Specimen: Nasal Mucosa; Nasal Swab  Result Value Ref Range Status   MRSA by PCR Next Gen NOT DETECTED NOT DETECTED Final    Comment: (NOTE) The GeneXpert MRSA Assay (FDA approved for NASAL specimens only), is one component of a comprehensive MRSA colonization surveillance program. It is not intended to diagnose MRSA infection nor to guide or monitor treatment for MRSA infections. Test performance is not FDA approved in patients less than 38 years old. Performed at [facility], [place] [address], [place], [place] [address]     RADIOLOGY STUDIES/RESULTS: No results found.   LOS: 3 days   Oren Binet, MD  [facility]    To contact the attending provider between  7A-7P or the covering provider during after hours 7P-7A,  please log into the web site [email or web address] and access using universal [place] password for that web site. If you do not have the password, please call the hospital operator.  09/10/2021, 11:13 AM

## 2021-09-10 NOTE — Progress Notes (Addendum)
[facility] NEPHROLOGY PROGRESS NOTE  Assessment/ Plan: Pt is a 88 y.o. yo female  with history of hypertension, A-fib, chronic CHF, hypothyroidism, anxiety depression, recent history of L5-S1 discitis due to Proteus/Citrobacter infection in 07/2021 was on Zosyn and then switched to oral Augmentin, recently started chlorthalidone as  outpatient presented to the hospital with nausea vomiting, decreased oral intake, found to have severe hyponatremia.  Zoloft was discontinued and we are consulted for the evaluation.  #Acute on chronic hyponatremia, euvolemic: Multifactorial etiology including SIADH in the setting of discitis/back pain, nausea and the use of Zoloft concomitant with mild hypothyroidism.  She was initially intravascularly volume depleted in the setting of chlorthalidone and decreased oral intake. Briefly required 3% saline for about 12-hour which was discontinued yesterday morning.  The sodium level improving  to124.  Clinically asymptomatic.  Salt tablet can be discontinued when serum sodium level improves to around 130.  Recommend fluid restriction and avoid SSRI and thiazide diuretics. Follow-up with PCP after discharge.  I have discussed with the patient's daughter who was present at the bedside.   #Hypokalemia: Potassium level improved after repletion.   #L5/S1 discitis/abscess: Currently on Augmentin till 5/22.   #History of C. difficile on prophylactic oral vancomycin while on antibiotics.   #Hypertension: BP acceptable continue metoprolol.   Subjective: Seen and examined in ICU.  No new event.  Denies nausea vomiting chest pain shortness of breath.  Labs gradually improving.  She is able to eat.  Increased urine output with a dose of Lasix yesterday.  Patient's daughter was present at the bedside.    Objective Vital signs in last 24 hours: Vitals:   09/10/21 0527 09/10/21 0623 09/10/21 0700 09/10/21 0800  BP: 130/85  (!) 143/71 122/61  Pulse: 92  89 87   Resp: '16  18 13  '$ Temp:      TempSrc:      SpO2: 100%  95% 93%  Weight:  72.9 kg    Height:       Weight change: 1.9 kg  Intake/Output Summary (Last 24 hours) at 09/10/2021 0903 Last data filed at 09/10/2021 0800 Gross per 24 hour  Intake 575 ml  Output 2155 ml  Net -1580 ml        Labs: Basic Metabolic Panel: Recent Labs  Lab 09/09/21 1047 09/09/21 1909 09/10/21 0048  NA 120* 121* 124*  K 3.7 3.4* 4.4  CL 88* 90* 92*  CO2 21* 24 23  GLUCOSE 151* 183* 145*  BUN '10 11 13  '$ CREATININE 0.77 0.93 0.81  CALCIUM 8.8* 9.0 9.0  PHOS 2.1*  --  2.1*    Liver Function Tests: Recent Labs  Lab 09/09/21 1047 09/10/21 0048  ALBUMIN 2.9* 2.8*   No results for input(s): LIPASE, AMYLASE in the last 168 hours. No results for input(s): AMMONIA in the last 168 hours. CBC: Recent Labs  Lab 09/06/21 1716 09/08/21 0059  WBC 11.7* 9.0  HGB 14.6 13.1  HCT 43.0 37.4  MCV 83.5 83.7  PLT 357 318    Cardiac Enzymes: No results for input(s): CKTOTAL, CKMB, CKMBINDEX, TROPONINI in the last 168 hours. CBG: No results for input(s): GLUCAP in the last 168 hours.  Iron Studies: No results for input(s): IRON, TIBC, TRANSFERRIN, FERRITIN in the last 72 hours. Studies/Results: No results found.  Medications: Infusions:    Scheduled Medications:  amoxicillin-clavulanate  1 tablet Oral Q12H   apixaban  5 mg Oral BID   brimonidine  1 drop Both Eyes Daily  Chlorhexidine Gluconate Cloth  6 each Topical Q2000   clonazePAM  0.5 mg Oral QHS   ferrous sulfate  325 mg Oral BID WC   isosorbide mononitrate  15 mg Oral QPM   latanoprost  1 drop Both Eyes QHS   levothyroxine  125 mcg Oral Daily   pantoprazole  40 mg Oral BID   prochlorperazine  5 mg Intravenous TID AC   sodium chloride  1 g Oral TID WC   vancomycin  125 mg Oral BID    have reviewed scheduled and prn medications.  Physical Exam: General: Not in distress, Heart:RRR, s1s2 nl Lungs: Clear b/l, no  crackle Abdomen:soft, Non-tender, non-distended Extremities:No peripheral edema Neurology: Alert, awake and following commands  Jameek Bruntz Tanna Furry 09/10/2021,9:03 AM  LOS: 3 days

## 2021-09-11 DIAGNOSIS — E871 Hypo-osmolality and hyponatremia: Secondary | ICD-10-CM | POA: Diagnosis not present

## 2021-09-11 DIAGNOSIS — I10 Essential (primary) hypertension: Secondary | ICD-10-CM | POA: Diagnosis not present

## 2021-09-11 DIAGNOSIS — R531 Weakness: Secondary | ICD-10-CM | POA: Diagnosis not present

## 2021-09-11 DIAGNOSIS — M4647 Discitis, unspecified, lumbosacral region: Secondary | ICD-10-CM | POA: Diagnosis not present

## 2021-09-11 LAB — BASIC METABOLIC PANEL
Anion gap: 10 (ref 5–15)
Anion gap: 13 (ref 5–15)
BUN: 20 mg/dL (ref 8–23)
BUN: 15 mg/dL (ref 8–23)
CO2: 25 mmol/L (ref 22–32)
CO2: 24 mmol/L (ref 22–32)
Calcium: 9 mg/dL (ref 8.9–10.3)
Calcium: 9.2 mg/dL (ref 8.9–10.3)
Chloride: 89 mmol/L — ABNORMAL LOW (ref 98–111)
Chloride: 88 mmol/L — ABNORMAL LOW (ref 98–111)
Creatinine, Ser: 0.72 mg/dL (ref 0.44–1.00)
Creatinine, Ser: 0.8 mg/dL (ref 0.44–1.00)
GFR, Estimated: >60 mL/min (ref >60)
GFR, Estimated: >60 mL/min (ref >60)
Glucose, Bld: 116 mg/dL — ABNORMAL HIGH (ref 70–99)
Glucose, Bld: 142 mg/dL — ABNORMAL HIGH (ref 70–99)
Potassium: 3.7 mmol/L (ref 3.5–5.1)
Potassium: 4 mmol/L (ref 3.5–5.1)
Sodium: 124 mmol/L — ABNORMAL LOW (ref 135–145)
Sodium: 125 mmol/L — ABNORMAL LOW (ref 135–145)

## 2021-09-11 MED ORDER — FUROSEMIDE 10 MG/ML IJ SOLN
40.0000 mg | Freq: Once | INTRAMUSCULAR | Status: AC
  Administered 2021-09-11: 40 mg via INTRAVENOUS
  Filled 2021-09-11: qty 4

## 2021-09-11 MED ORDER — POTASSIUM CHLORIDE CRYS ER 20 MEQ PO TBCR
40.0000 meq | EXTENDED_RELEASE_TABLET | Freq: Two times a day (BID) | ORAL | Status: AC
  Administered 2021-09-11 (×2): 40 meq via ORAL
  Filled 2021-09-11 (×2): qty 2

## 2021-09-11 NOTE — Progress Notes (Signed)
Physical Therapy Treatment Patient Details Name: Brooke Beasley MRN: [id number] DOB: 01-08-1931 Today's Date: 09/11/2021   History of Present Illness 88 year old white female presents to the ER 09/07/21 with several days of nausea, vomiting, weakness. +hyponatremia,  PMH atrial fibrillation, discitis after a perirectal abscess, hypertension, CKD stage IIIa, mitral regurg, hypothyroidism.    PT Comments    Pt received in supine, sleeping but easily awoken and agreeable to therapy session. Pt able to perform bed mobility, transfers and short gait task in room with up to +2 minA and greatly increased time to initiate/perform tasks. Pt instructed on supine therapeutic exercises for strengthening, she would benefit from supine/seated HEP handout next session for reinforcement. Pt with notable tachycardia with exertional tasks (transfers/gait HR to 140's bpm) and RN notified. Pt continues to benefit from PT services to progress toward functional mobility goals.   Recommendations for follow up therapy are one component of a multi-disciplinary discharge planning process, led by the attending physician.  Recommendations may be updated based on patient status, additional functional criteria and insurance authorization.  Follow Up Recommendations  Home health PT     Assistance Recommended at Discharge Intermittent Supervision/Assistance  Patient can return home with the following A little help with walking and/or transfers;A little help with bathing/dressing/bathroom;Assistance with cooking/housework;Help with stairs or ramp for entrance   Equipment Recommendations  None recommended by PT    Recommendations for Other Services       Precautions / Restrictions Precautions Precautions: Fall Precaution Comments: watch HR Restrictions Weight Bearing Restrictions: No     Mobility  Bed Mobility Overal bed mobility: Needs Assistance Bed Mobility: Rolling, Sidelying to Sit Rolling: Min  guard Sidelying to sit: Min assist, HOB elevated       General bed mobility comments: pt requesting HHA, needs max multimodal cues for technique to sit up on L EOB; pt modI for supine to long sit but needs increased time/effort to move to EOB and frequently requesting therapist help. Pt able to be redirected to more independent technique with time.    Transfers Overall transfer level: Needs assistance Equipment used: Rolling walker (2 wheels) Transfers: Sit to/from Stand Sit to Stand: Min assist, From elevated surface           General transfer comment: light min A for steadying with power up to standing at RW; from EOB>RW and RW>elevated BSC over toilet in bathroom, then BSC>chair    Ambulation/Gait Ambulation/Gait assistance: Min assist, +2 safety/equipment Gait Distance (Feet): 15 Feet (37f from bed>bathroom, then ~586ffrom toilet>chair) Assistive device: Rolling walker (2 wheels) Gait Pattern/deviations: Step-through pattern, Decreased step length - right, Decreased step length - left, Shuffle, Trunk flexed       General Gait Details: light min A for ambulation with mod cues for proximity to RW, activity pacing, chair follow for safety but pt able to make it to bathroom without sitting prematurely; tachy to mid-140's bpm so shorter gait trial on return to chair and pt pushed back to far side of bed for safety and pt c/o severe fatigue.      Balance Overall balance assessment: Needs assistance Sitting-balance support: No upper extremity supported, Feet unsupported Sitting balance-Leahy Scale: Fair     Standing balance support: Bilateral upper extremity supported, Reliant on assistive device for balance Standing balance-Leahy Scale: Poor Standing balance comment: requires assist for steadying while donning brief and during gait; decreased awareness of obstacles on her L side and frequently needs cues to avoid banging her knuckles on  the counter as she walked by.             Cognition Arousal/Alertness: Awake/alert Behavior During Therapy: Flat affect Overall Cognitive Status: Difficult to assess           General Comments: Pt following commands with some repetition needed due to [facility], slow processing, initially drowsy but more awake once EOB/standing. Pt family present and asking many questions on pt's behalf and afraid to allow therapist to awaken her although per family report she had slept most of past 2 days and evening.        Exercises Other Exercises Other Exercises: supine BLE A/AAROM: ankle pumps, hip flexion, LAQ x10 reps ea Other Exercises: seated BUE AROM: rowing x20 reps for strengthening (pt in long sit in bed while performing)    General Comments General comments (skin integrity, edema, etc.): BP 173/83 (~30 mins prior to OOB) resting supine, then BP 146/77 reclined in chair post-exertion. Pt reports no dizziness during mobility tasks; SpO2 96% on 2L O2 [place] with exertion and remained on 2L in chair. HR tachy to mid-140's with gait.      Pertinent Vitals/Pain Pain Assessment Pain Assessment: Faces Faces Pain Scale: Hurts a little bit Pain Location: not localized, pt grimacing at times Pain Descriptors / Indicators: Grimacing Pain Intervention(s): Limited activity within patient's tolerance, Monitored during session, Repositioned     PT Goals (current goals can now be found in the care plan section) Acute Rehab PT Goals Patient Stated Goal: to feel better PT Goal Formulation: With patient Time For Goal Achievement: 09/21/21 Progress towards PT goals: Progressing toward goals    Frequency    Min 3X/week      PT Plan Current plan remains appropriate (pending progress)       AM-PAC PT "6 Clicks" Mobility   Outcome Measure  Help needed turning from your back to your side while in a flat bed without using bedrails?: A Little Help needed moving from lying on your back to sitting on the side of a flat bed without using  bedrails?: A Little Help needed moving to and from a bed to a chair (including a wheelchair)?: A Little Help needed standing up from a chair using your arms (e.g., wheelchair or bedside chair)?: A Little Help needed to walk in hospital room?: A Lot (mod cues and chair follow) Help needed climbing 3-5 steps with a railing? : Total 6 Click Score: 15    End of Session Equipment Utilized During Treatment: Gait belt;Oxygen Activity Tolerance: Patient tolerated treatment well;Treatment limited secondary to medical complications (Comment) (tachycardia limiting gait distance) Patient left: in chair;with call bell/phone within reach;with chair alarm set;Other (comment);with family/visitor present (niece present, pt agreeable to eat; RN notified her purewick canister nearly full so briefs donned and purewick not yet replaced.) Nurse Communication: Mobility status;Other (comment) (tachycardia with exertion) PT Visit Diagnosis: Difficulty in walking, not elsewhere classified (R26.2);Muscle weakness (generalized) (M62.81)     Time: 6270-3500 PT Time Calculation (min) (ACUTE ONLY): 50 min  Charges:  $Gait Training: 8-22 mins $Therapeutic Exercise: 8-22 mins $Therapeutic Activity: 8-22 mins                     Cannen Dupras P., PTA Acute Rehabilitation Services Secure Chat Preferred 9a-5:30pm Office: [place] 09/11/2021, 3:17 PM

## 2021-09-11 NOTE — Care Management Important Message (Signed)
Important Message  Patient Details  Name: Brooke Beasley MRN: [id number] Date of Birth: [birth year]   Medicare Important Message Given:  Yes     Peachie Barkalow Montine Circle 09/11/2021, 4:18 PM

## 2021-09-11 NOTE — Progress Notes (Addendum)
PROGRESS NOTE        PATIENT DETAILS Name: Brooke Beasley Age: 88 y.o. Sex: female Date of Birth: July 21, 1930 Admit Date: 09/06/2021 Admitting Physician Shela Leff, MD MWN:[id number], Garlon Hatchet, MD  Brief Summary: Patient is a 88 y.o.  female with recent history of L5-S1 discitis (Proteus/Citrobacter)-April 2023-no longer on IV Zosyn-now on oral Augmentin until 5/22-recently started on chlorthalidone by outpatient MD-presented to the hospital with 4-day history of nausea/poor oral intake (mostly consuming liquids)-found to have severe hyponatremia and subsequently admitted to the hospitalist service.   Significant events: 3/30-4/13>> hospitalization for L5-S1 discitis/abscess posterior to rectum-cultures Proteus/Citrobacter-discharged on IV Zosyn-stop date 5/22.  New onset A-fib-started on anticoagulation. 5/03>> outpatient MRI-resolution of L5-S1 epidural abscess, persistent L5-S1 discitis/osteomyelitis. 5/11>> due to persistent leukocytosis-intermittent fevers-PICC line removed on 5/5-transition to Augmentin by ID in the outpatient setting. 5/14>> admit for hyponatremia. 5/15>> no response to IVF-renal consult-to ICU for 3% NaCl.   5/17 >> transfer back to [facility]-off 3% NaCl since 5/16.   Significant studies: 5/14>> CXR: No PNA 5/14>> CT head: No acute abnormality. 5/15>> TSH: 18.4 5/15>> cortisol a.m.: 31.4  Significant microbiology data:   Procedures:   Consults: Nephrology, PCCM  Subjective: Sleepy this morning-desaturated last night-requiring CPAP.  Per daughter had a panic attack when she was desaturating.  Family inquiring whether she can qualify for CPAP without a sleep study.    Objective: Vitals: Blood pressure (!) 160/86, pulse 96, temperature 98 F (36.7 C), temperature source Axillary, resp. rate 18, height '5\' 1"'$  (1.549 m), weight 73.2 kg, SpO2 97 %.   Exam: Gen Exam:Alert awake-not in any distress HEENT:atraumatic,  normocephalic Chest: B/L clear to auscultation anteriorly CVS:S1S2 regular Abdomen:soft non tender, non distended Extremities:no edema Neurology: Non focal Skin: no rash   Pertinent Labs/Radiology:    Latest Ref Rng & Units 09/08/2021   12:59 AM 09/06/2021    5:16 PM 08/26/2021    3:45 PM  CBC  WBC 4.0 - 10.5 K/uL 9.0   11.7   13.6    Hemoglobin 12.0 - 15.0 g/dL 13.1   14.6   12.0    Hematocrit 36.0 - 46.0 % 37.4   43.0   38.1    Platelets 150 - 400 K/uL 318   357   293      Lab Results  Component Value Date   NA 124 (L) 09/11/2021   K 3.7 09/11/2021   CL 89 (L) 09/11/2021   CO2 25 09/11/2021       Assessment/Plan: Hyponatremia: Multifactorial etiology-SIADH (nausea/Zoloft)-excessive fluid intake (not eating solids due to intractable nausea)-recently started on chlorthalidone.  No response to IVF-in fact sodium levels dropped-she was transferred to ICU for 3% saline.  Once sodium level stabilized-3% saline discontinued-patient was started on salt tablets and Lasix.  Sodium levels gradually improving-she continues to be nauseous-repeat IV Lasix 40 mg today-continue fluid restrictions.  Nephrology following.  Repeat chemistry later today.  Intractable nausea: Unclear etiology-significant improvement-but continues to have intermittent nausea.  Initially on scheduled Compazine-now changed to as needed.    L 5-S1 discitis/abscess: Recent scans reassuring-on antimicrobial therapy until 5/22.  Follows with ID-Dr. Juleen China in the outpatient setting.  History of C. difficile: On prophylactic oral vancomycin as patient is on antibiotics for discitis.  HTN: BP reasonable-continue metoprolol, Imdur.  No plans to resume chlorthalidone on discharge given severity of hyponatremia.  Persistent atrial fibrillation: Continue Lopressor-on Eliquis.  Chronic HFpEF: Volume status stable.  On furosemide.  Aortic valve stenosis: Stable for outpatient monitoring with periodic echo  Hypothyroidism:  TSH significantly elevated-Synthroid has been increased to 150 mcg.  Repeat TSH in 3 months.  Anxiety/depression: Continue to hold Zoloft on discharge due to possible contribution to SIADH physiology causing hyponatremia-see above-continue Klonopin.  ?  OSA: Continue CPAP while hospitalized.  Discussed with case management-to see if patient can qualify for CPAP on discharge based on nocturnal desaturation.  BMI: Estimated body mass index is 30.49 kg/m as calculated from the following:   Height as of this encounter: '5\' 1"'$  (1.549 m).   Weight as of this encounter: 73.2 kg.   Code status:   Code Status: DNR   DVT Prophylaxis: SCDs Start: 09/07/21 0430 apixaban (ELIQUIS) tablet 5 mg    Family Communication: Daughter at bedside on 5/19   Disposition Plan: Status is: Inpatient Remains inpatient appropriate because: Improving hyponatremia-noted stable for discharge.   Planned Discharge Destination:Home health   Diet: Diet Order             Diet regular Room service appropriate? Yes; Fluid consistency: Thin; Fluid restriction: 1200 mL Fluid  Diet effective now                     Antimicrobial agents: Anti-infectives (From admission, onward)    Start     Dose/Rate Route Frequency Ordered Stop   09/09/21 1000  amoxicillin-clavulanate (AUGMENTIN) 875-125 MG per tablet 1 tablet        1 tablet Oral Every 12 hours 09/09/21 0738 09/15/21 0959   09/07/21 2100  Ampicillin-Sulbactam (UNASYN) 3 g in sodium chloride 0.9 % 100 mL IVPB  Status:  Discontinued        3 g 200 mL/hr over 30 Minutes Intravenous Every 8 hours 09/07/21 1013 09/09/21 0738   09/07/21 1000  vancomycin (VANCOCIN) capsule 125 mg        125 mg Oral 2 times daily 09/06/21 2332 09/22/21 0959   09/07/21 1000  amoxicillin-clavulanate (AUGMENTIN) 875-125 MG per tablet 1 tablet  Status:  Discontinued        1 tablet Oral Every 12 hours 09/07/21 0429 09/07/21 0949   09/06/21 2200  piperacillin-tazobactam (ZOSYN) IVPB  3.375 g  Status:  Discontinued        3.375 g 12.5 mL/hr over 240 Minutes Intravenous Every 8 hours 09/06/21 2124 09/07/21 0428        MEDICATIONS: Scheduled Meds:  acetaminophen  1,000 mg Oral Q8H   amoxicillin-clavulanate  1 tablet Oral Q12H   apixaban  5 mg Oral BID   brimonidine  1 drop Both Eyes Daily   chlorhexidine  15 mL Mouth Rinse BID   clonazePAM  0.5 mg Oral QHS   ferrous sulfate  325 mg Oral BID WC   isosorbide mononitrate  15 mg Oral QPM   latanoprost  1 drop Both Eyes QHS   levothyroxine  125 mcg Oral Daily   mouth rinse  15 mL Mouth Rinse q12n4p   pantoprazole  40 mg Oral BID   potassium chloride  40 mEq Oral BID   sodium chloride  1 g Oral TID WC   vancomycin  125 mg Oral BID   Continuous Infusions:   PRN Meds:.atropine, bisacodyl, diclofenac Sodium, hydrALAZINE, ketorolac, ondansetron **OR** ondansetron (ZOFRAN) IV, prochlorperazine   I have personally reviewed following labs and imaging studies  LABORATORY DATA: CBC: Recent Labs  Lab  09/06/21 1716 09/08/21 0059  WBC 11.7* 9.0  HGB 14.6 13.1  HCT 43.0 37.4  MCV 83.5 83.7  PLT 357 318     Basic Metabolic Panel: Recent Labs  Lab 09/08/21 1032 09/08/21 1406 09/09/21 0613 09/09/21 1047 09/09/21 1909 09/10/21 0048 09/11/21 0102  NA 121*   < > 123* 120* 121* 124* 124*  K 3.5  --   --  3.7 3.4* 4.4 3.7  CL 89*  --   --  88* 90* 92* 89*  CO2 23  --   --  21* '24 23 25  '$ GLUCOSE 122*  --   --  151* 183* 145* 116*  BUN 11  --   --  '10 11 13 20  '$ CREATININE 0.75  --   --  0.77 0.93 0.81 0.72  CALCIUM 8.7*  --   --  8.8* 9.0 9.0 9.0  PHOS  --   --   --  2.1*  --  2.1*  --    < > = values in this interval not displayed.     GFR: Estimated Creatinine Clearance: 44.5 mL/min (by C-G formula based on SCr of 0.72 mg/dL).  Liver Function Tests: Recent Labs  Lab 09/09/21 1047 09/10/21 0048  ALBUMIN 2.9* 2.8*    No results for input(s): LIPASE, AMYLASE in the last 168 hours. No results for  input(s): AMMONIA in the last 168 hours.  Coagulation Profile: No results for input(s): INR, PROTIME in the last 168 hours.  Cardiac Enzymes: No results for input(s): CKTOTAL, CKMB, CKMBINDEX, TROPONINI in the last 168 hours.  BNP (last 3 results) Recent Labs    02/10/21 1313  PROBNP 165.0*     Lipid Profile: No results for input(s): CHOL, HDL, LDLCALC, TRIG, CHOLHDL, LDLDIRECT in the last 72 hours.  Thyroid Function Tests: No results for input(s): TSH, T4TOTAL, FREET4, T3FREE, THYROIDAB in the last 72 hours.   Anemia Panel: No results for input(s): VITAMINB12, FOLATE, FERRITIN, TIBC, IRON, RETICCTPCT in the last 72 hours.  Urine analysis:    Component Value Date/Time   COLORURINE YELLOW 09/06/2021 1853   APPEARANCEUR CLEAR 09/06/2021 1853   LABSPEC 1.011 09/06/2021 1853   PHURINE 6.5 09/06/2021 1853   GLUCOSEU NEGATIVE 09/06/2021 1853   HGBUR NEGATIVE 09/06/2021 1853   HGBUR trace-lysed 06/11/2010 0822   BILIRUBINUR NEGATIVE 09/06/2021 1853   BILIRUBINUR negative 10/17/2017 1111   KETONESUR NEGATIVE 09/06/2021 1853   PROTEINUR NEGATIVE 09/06/2021 1853   UROBILINOGEN 0.2 10/17/2017 1111   UROBILINOGEN 0.2 04/08/2012 1221   NITRITE NEGATIVE 09/06/2021 1853   LEUKOCYTESUR NEGATIVE 09/06/2021 1853    Sepsis Labs: Lactic Acid, Venous No results found for: LATICACIDVEN  MICROBIOLOGY: Recent Results (from the past 240 hour(s))  MRSA Next Gen by PCR, Nasal     Status: None   Collection Time: 09/07/21  6:31 PM   Specimen: Nasal Mucosa; Nasal Swab  Result Value Ref Range Status   MRSA by PCR Next Gen NOT DETECTED NOT DETECTED Final    Comment: (NOTE) The GeneXpert MRSA Assay (FDA approved for NASAL specimens only), is one component of a comprehensive MRSA colonization surveillance program. It is not intended to diagnose MRSA infection nor to guide or monitor treatment for MRSA infections. Test performance is not FDA approved in patients less than 69  years old. Performed at [facility], [place] [address], [place], [place] [address]     RADIOLOGY STUDIES/RESULTS: No results found.   LOS: 4 days   Oren Binet, MD  [facility]    To contact the attending provider between 7A-7P or the covering provider during after hours 7P-7A, please log into the web site [email or web address] and access using universal [place] password for that web site. If you do not have the password, please call the hospital operator.  09/11/2021, 1:57 PM

## 2021-09-11 NOTE — Progress Notes (Signed)
Placed patient on CPAP for the night with oxygen set at 2lpm. 

## 2021-09-11 NOTE — Progress Notes (Signed)
OT Cancellation Note  Patient Details Name: Brooke Beasley MRN: [id number] DOB: Nov 06, 1930   Cancelled Treatment:    Reason Eval/Treat Not Completed:  (Pt sleeping in chair, daughter asking that pt not be disturbed.)  [name] 09/11/2021, 3:39 PM Nestor Lewandowsky, OTR/L Acute Rehabilitation Services Pager: [phone] Office: [phone]

## 2021-09-11 NOTE — Progress Notes (Signed)
Brooke Beasley  Assessment/ Plan: Pt is a 88 y.o. yo female  with history of hypertension, A-fib, chronic CHF, hypothyroidism, anxiety depression, recent history of L5-S1 discitis due to Proteus/Citrobacter infection in 07/2021 was on Zosyn and then switched to oral Augmentin, recently started chlorthalidone as  outpatient presented to the hospital with nausea vomiting, decreased oral intake, found to have severe hyponatremia.  Zoloft was discontinued and we are consulted for the evaluation.  #Acute on chronic hyponatremia, euvolemic: Multifactorial etiology including SIADH in the setting of discitis/back pain, nausea and the use of Zoloft concomitant with mild hypothyroidism.  She was initially intravascularly volume depleted in the setting of chlorthalidone and decreased oral intake. Briefly required 3% saline for about 12-hour.  Treated with IV Lasix and salt tablet.  Sodium remained stable at 124.  Plan for Lasix 40 mg IV today, continue salt tablet and monitor lab. Recommend fluid restriction and avoid SSRI and thiazide diuretics.    #Hypokalemia: Replete potassium chloride.   #L5/S1 discitis/abscess: Currently on Augmentin till 5/22.   #History of C. difficile on prophylactic oral vancomycin while on antibiotics.   #Hypertension: BP acceptable continue metoprolol.  Discussed with the primary team.   Subjective: Seen and examined in ICU.  Transferred to the floor.  Sodium remains 124.  Became hypoxic overnight thought to be due to sleep apnea.  Feels tired.  No other new event.  The patient's daughter presents at the bedside.   Objective Vital signs in last 24 hours: Vitals:   09/11/21 0029 09/11/21 0327 09/11/21 0500 09/11/21 0800  BP:  (!) 157/93  (!) 167/89  Pulse: 83 87  99  Resp: '16 19  20  '$ Temp:  97.9 F (36.6 C)  98.1 F (36.7 C)  TempSrc:  Oral  Axillary  SpO2: 99% 99%  94%  Weight:   73.2 kg   Height:       Weight change: 0.3  kg  Intake/Output Summary (Last 24 hours) at 09/11/2021 0941 Last data filed at 09/11/2021 0600 Gross per 24 hour  Intake 600 ml  Output 1350 ml  Net -750 ml        Labs: Basic Metabolic Panel: Recent Labs  Lab 09/09/21 1047 09/09/21 1909 09/10/21 0048 09/11/21 0102  NA 120* 121* 124* 124*  K 3.7 3.4* 4.4 3.7  CL 88* 90* 92* 89*  CO2 21* '24 23 25  '$ GLUCOSE 151* 183* 145* 116*  BUN '10 11 13 20  '$ CREATININE 0.77 0.93 0.81 0.72  CALCIUM 8.8* 9.0 9.0 9.0  PHOS 2.1*  --  2.1*  --     Liver Function Tests: Recent Labs  Lab 09/09/21 1047 09/10/21 0048  ALBUMIN 2.9* 2.8*    No results for input(s): LIPASE, AMYLASE in the last 168 hours. No results for input(s): AMMONIA in the last 168 hours. CBC: Recent Labs  Lab 09/06/21 1716 09/08/21 0059  WBC 11.7* 9.0  HGB 14.6 13.1  HCT 43.0 37.4  MCV 83.5 83.7  PLT 357 318    Cardiac Enzymes: No results for input(s): CKTOTAL, CKMB, CKMBINDEX, TROPONINI in the last 168 hours. CBG: No results for input(s): GLUCAP in the last 168 hours.  Iron Studies: No results for input(s): IRON, TIBC, TRANSFERRIN, FERRITIN in the last 72 hours. Studies/Results: No results found.  Medications: Infusions:    Scheduled Medications:  acetaminophen  1,000 mg Oral Q8H   amoxicillin-clavulanate  1 tablet Oral Q12H   apixaban  5 mg Oral BID  brimonidine  1 drop Both Eyes Daily   chlorhexidine  15 mL Mouth Rinse BID   clonazePAM  0.5 mg Oral QHS   ferrous sulfate  325 mg Oral BID WC   isosorbide mononitrate  15 mg Oral QPM   latanoprost  1 drop Both Eyes QHS   levothyroxine  125 mcg Oral Daily   mouth rinse  15 mL Mouth Rinse q12n4p   pantoprazole  40 mg Oral BID   potassium chloride  40 mEq Oral BID   sodium chloride  1 g Oral TID WC   vancomycin  125 mg Oral BID    have reviewed scheduled and prn medications.  Physical Exam: General: Elderly female sitting on bed with oxygen Heart:RRR, s1s2 nl Lungs: Clear b/l, no  crackle Abdomen:soft, Non-tender, non-distended Extremities: Trace peripheral edema Neurology: Alert, awake and following commands  Nihar Klus Tanna Furry 09/11/2021,9:41 AM  LOS: 4 days

## 2021-09-11 NOTE — Progress Notes (Signed)
2230 Called by the pt and her daughter to the room and was told by them that pt has not urinate since 1700. Pt denies urge to urinate. Bladder scan shows 195 mL. Pt has no difficulty breathing. Her saturation is 94-97% on RA. Pt does get SOB with exertion. Her lungs sound diminished bilat. Pt daughter stated she afraid of her mother getting SOB due to urine retention. On call provider notified, order received for straight cath. 350 mL of  yellow/clear urine removed.

## 2021-09-11 NOTE — TOC Progression Note (Addendum)
Transition of Care [facility]) - Progression Note    Patient Details  Name: Brooke Beasley MRN: [id number] Date of Birth: January 26, 1931  Transition of Care [facility]) CM/SW [place], RN Phone Number: 09/10/21, 1200   Clinical Narrative:   TOC acknowledges patient is high risk for readmission. CM at bedside to complete assessment. Patient is sound asleep and not easily aroused to answer questions. Patient drifts back off to sleep. TOC will attempt assessment at another time.         Expected Discharge Plan and Services                                                 Social Determinants of Health (SDOH) Interventions    Readmission Risk Interventions    05/29/2021   11:32 AM  Readmission Risk Prevention Plan  Transportation Screening Complete  PCP or Specialist Appt within 3-5 Days Complete  HRI or Home Care Consult Complete  Palliative Care Screening Not Applicable

## 2021-09-12 ENCOUNTER — Inpatient Hospital Stay (HOSPITAL_COMMUNITY): Payer: PPO

## 2021-09-12 DIAGNOSIS — E871 Hypo-osmolality and hyponatremia: Secondary | ICD-10-CM | POA: Diagnosis not present

## 2021-09-12 LAB — BASIC METABOLIC PANEL
Anion gap: 8 (ref 5–15)
BUN: 19 mg/dL (ref 8–23)
CO2: 29 mmol/L (ref 22–32)
Calcium: 9.1 mg/dL (ref 8.9–10.3)
Chloride: 90 mmol/L — ABNORMAL LOW (ref 98–111)
Creatinine, Ser: 0.82 mg/dL (ref 0.44–1.00)
GFR, Estimated: >60 mL/min (ref >60)
Glucose, Bld: 113 mg/dL — ABNORMAL HIGH (ref 70–99)
Potassium: 4.1 mmol/L (ref 3.5–5.1)
Sodium: 127 mmol/L — ABNORMAL LOW (ref 135–145)

## 2021-09-12 MED ORDER — FUROSEMIDE 10 MG/ML IJ SOLN
40.0000 mg | Freq: Once | INTRAMUSCULAR | Status: AC
  Administered 2021-09-12: 40 mg via INTRAVENOUS
  Filled 2021-09-12: qty 4

## 2021-09-12 MED ORDER — FUROSEMIDE 20 MG PO TABS: 20.0000 mg | ORAL_TABLET | Freq: Every day | ORAL | Status: DC

## 2021-09-12 NOTE — Progress Notes (Signed)
PROGRESS NOTE        PATIENT DETAILS Name: Brooke Beasley Age: 88 y.o. Sex: female Date of Birth: [birth year] Admit Date: 09/06/2021 Admitting Physician Shela Leff, MD [id number], Garlon Hatchet, MD  Brief Summary: Patient is a 88 y.o.  female with recent history of L5-S1 discitis (Proteus/Citrobacter)-April 2023-no longer on IV Zosyn-now on oral Augmentin until 5/22-recently started on chlorthalidone by outpatient MD-presented to the hospital with 4-day history of nausea/poor oral intake (mostly consuming liquids)-found to have severe hyponatremia and subsequently admitted to the hospitalist service.   Significant events: 3/30-4/13>> hospitalization for L5-S1 discitis/abscess posterior to rectum-cultures Proteus/Citrobacter-discharged on IV Zosyn-stop date 5/22.  New onset A-fib-started on anticoagulation. 5/03>> outpatient MRI-resolution of L5-S1 epidural abscess, persistent L5-S1 discitis/osteomyelitis. 5/11>> due to persistent leukocytosis-intermittent fevers-PICC line removed on 5/5-transition to Augmentin by ID in the outpatient setting. 5/14>> admit for hyponatremia. 5/15>> no response to IVF-renal consult-to ICU for 3% NaCl.   5/17 >> transfer back to [facility]-off 3% NaCl since 5/16.   Significant studies: 5/14>> CXR: No PNA 5/14>> CT head: No acute abnormality. 5/15>> TSH: 18.4 5/15>> cortisol a.m.: 31.4  Significant microbiology data:   Procedures:   Consults: Nephrology, PCCM  Subjective:   Patient in bed, appears comfortable, denies any headache, no fever, no chest pain or pressure, no shortness of breath , no abdominal pain. No new focal weakness.   Objective: Vitals: Blood pressure 128/77, pulse 82, temperature 98.1 F (36.7 C), temperature source Oral, resp. rate 17, height '5\' 1"'$  (1.549 m), weight 73.2 kg, SpO2 96 %.   Exam:  Awake Alert, No new F.N deficits, Normal affect [place].AT,PERRAL Supple Neck, No JVD,   Symmetrical  Chest wall movement, Good air movement bilaterally, CTAB RRR,No Gallops, Rubs or new Murmurs,  +ve B.Sounds, Abd Soft, No tenderness,   No Cyanosis, Clubbing or edema    Assessment/Plan:  Hyponatremia: Seen by nephrology, initially required 3% normal saline in ICU, likely due to SIADH, Zoloft on hold, placed on fluid restriction, counseled on fluid restriction, continue gentle Lasix, sodium improving continue to monitor Case discussed with nephrology on 09/12/2021.  Intractable nausea: Unclear etiology-significant improvement-but continues to have intermittent nausea.  Initially on scheduled Compazine-now changed to as needed.    L 5-S1 discitis/abscess: Recent scans reassuring-on antimicrobial therapy until 09/14/21.  Follows with ID-Dr. Juleen China in the outpatient setting.  History of C. difficile: On prophylactic oral vancomycin as patient is on antibiotics for discitis.  Stop date 09/21/2021  HTN: BP reasonable-continue metoprolol, Imdur.  Likely will discharge on Lasix low-dose.  Persistent atrial fibrillation: Continue Lopressor-on Eliquis.  Chronic HFpEF: Volume status stable.  On furosemide.  Aortic valve stenosis: Stable for outpatient monitoring with periodic echo  Hypothyroidism: TSH significantly elevated-Synthroid has been increased to 150 mcg.  Repeat TSH in 3 months.  Anxiety/depression: Continue to hold Zoloft on discharge due to possible contribution to SIADH physiology causing hyponatremia-see above-continue Klonopin.  ?  OSA: Formal diagnosis currently on CPAP at nighttime, discussed with respiratory therapist on 09/12/2021, will give trial of nighttime oxygen if desaturates then CPAP here.  Social worker will try and see if CPAP will be approved by insurance company in case she requires it.   BMI: Estimated body mass index is 30.49 kg/m as calculated from the following:   Height as of this encounter: '5\' 1"'$  (1.549 m).   Weight as of this  encounter: 73.2 kg.   Code  status:   Code Status: DNR   DVT Prophylaxis: SCDs Start: 09/07/21 0430 apixaban (ELIQUIS) tablet 5 mg    Family Communication: Daughter at bedside on 09/12/2021   Disposition Plan: Status is: Inpatient Remains inpatient appropriate because: Improving hyponatremia-noted stable for discharge.   Planned Discharge Destination:Home health   Diet: Diet Order             Diet regular Room service appropriate? Yes; Fluid consistency: Thin; Fluid restriction: 1200 mL Fluid  Diet effective now                    MEDICATIONS: Scheduled Meds:  acetaminophen  1,000 mg Oral Q8H   amoxicillin-clavulanate  1 tablet Oral Q12H   apixaban  5 mg Oral BID   brimonidine  1 drop Both Eyes Daily   chlorhexidine  15 mL Mouth Rinse BID   clonazePAM  0.5 mg Oral QHS   ferrous sulfate  325 mg Oral BID WC   isosorbide mononitrate  15 mg Oral QPM   latanoprost  1 drop Both Eyes QHS   levothyroxine  125 mcg Oral Daily   mouth rinse  15 mL Mouth Rinse q12n4p   pantoprazole  40 mg Oral BID   sodium chloride  1 g Oral TID WC   vancomycin  125 mg Oral BID   Continuous Infusions:   PRN Meds:.atropine, bisacodyl, diclofenac Sodium, hydrALAZINE, ketorolac, ondansetron **OR** ondansetron (ZOFRAN) IV, prochlorperazine   I have personally reviewed following labs and imaging studies  LABORATORY DATA:  Signature  Lala Lund M.D on 09/12/2021 at 9:42 AM   -  To page go to [email or web address]      RADIOLOGY STUDIES/RESULTS: DG Chest Port 1 View  Result Date: 09/12/2021 CLINICAL DATA:  Shortness of breath EXAM: PORTABLE CHEST 1 VIEW COMPARISON:  Sep 06, 2021 FINDINGS: The cardiomediastinal silhouette is stable. No pneumothorax. Stable calcified nodule in the right base. No new or suspicious nodules, masses, or infiltrates. No other acute interval changes. IMPRESSION: No active disease. Electronically Signed   By: Dorise Bullion III M.D.   On: 09/12/2021 08:25     LOS: 5 days    Signature  Lala Lund M.D on 09/12/2021 at 9:42 AM   -  To page go to [email or web address]

## 2021-09-12 NOTE — Progress Notes (Signed)
**Brooke Brooke** Brooke Brooke  Assessment/ Plan: Pt is a 88 y.o. yo female  with history of hypertension, A-fib, chronic CHF, hypothyroidism, anxiety depression, recent history of L5-S1 discitis due to Proteus/Citrobacter infection in 07/2021 was on Zosyn and then switched to oral Augmentin, recently started chlorthalidone as  outpatient presented to the hospital with nausea vomiting, decreased oral intake, found to have severe hyponatremia.  Zoloft was discontinued and we are consulted for the evaluation.  #Acute on chronic hyponatremia, euvolemic: Multifactorial etiology including SIADH in the setting of discitis/back pain, nausea and the use of Zoloft concomitant with mild hypothyroidism.  She was initially intravascularly volume depleted in the setting of chlorthalidone and decreased oral intake. Briefly required 3% saline for about 12-hour.  Treated with IV Lasix and salt tablet.  Sodium level improved to 127 today.  I will discontinue salt tablet and start oral Lasix 20 mg daily. Recommend fluid restriction and avoid SSRI and thiazide diuretics.  Recommend to check BMP in a week with PCP. Discussed with the primary team and with the patient's daughter.   #Hypokalemia: Improved after potassium chloride repletion.  #L5/S1 discitis/abscess: Currently on Augmentin till 5/22.   #History of C. difficile on prophylactic oral vancomycin while on antibiotics.   #Hypertension: BP acceptable continue metoprolol.  #Possible OSA: She had CPAP overnight and looks more alert and comfortable today.  She will need outpatient sleep study for diagnosis.  As per primary team.  Sign off, please call back with question.  Subjective: Seen and examined.  Received Lasix yesterday with urine output around 1.8 L.  Serum sodium level improving.  The patient is more alert awake and seems to have improved with CPAP overnight.  Her daughter was present at the bedside.  Objective Vital signs in  last 24 hours: Vitals:   09/11/21 2220 09/11/21 2355 09/12/21 0316 09/12/21 0808  BP:   (!) 147/87 128/77  Pulse: 69 94 99 82  Resp: '18 20 20 17  '$ Temp:  98 F (36.7 C) 97.9 F (36.6 C) 98.1 F (36.7 C)  TempSrc:  Oral Oral Oral  SpO2: 94% 99% 96% 96%  Weight:      Height:       Weight change:   Intake/Output Summary (Last 24 hours) at 09/12/2021 1005 Last data filed at 09/11/2021 2300 Gross per 24 hour  Intake --  Output 1800 ml  Net -1800 ml        Labs: Basic Metabolic Panel: Recent Labs  Lab 09/09/21 1047 09/09/21 1909 09/10/21 0048 09/11/21 0102 09/11/21 1447 09/12/21 0123  NA 120*   < > 124* 124* 125* 127*  K 3.7   < > 4.4 3.7 4.0 4.1  CL 88*   < > 92* 89* 88* 90*  CO2 21*   < > '23 25 24 29  '$ GLUCOSE 151*   < > 145* 116* 142* 113*  BUN 10   < > '13 20 15 19  '$ CREATININE 0.77   < > 0.81 0.72 0.80 0.82  CALCIUM 8.8*   < > 9.0 9.0 9.2 9.1  PHOS 2.1*  --  2.1*  --   --   --    < > = values in this interval not displayed.    Liver Function Tests: Recent Labs  Lab 09/09/21 1047 09/10/21 0048  ALBUMIN 2.9* 2.8*    No results for input(s): LIPASE, AMYLASE in the last 168 hours. No results for input(s): AMMONIA in the last 168 hours. CBC: Recent Labs  Lab 09/06/21 1716 09/08/21 0059  WBC 11.7* 9.0  HGB 14.6 13.1  HCT 43.0 37.4  MCV 83.5 83.7  PLT 357 318    Cardiac Enzymes: No results for input(s): CKTOTAL, CKMB, CKMBINDEX, TROPONINI in the last 168 hours. CBG: No results for input(s): GLUCAP in the last 168 hours.  Iron Studies: No results for input(s): IRON, TIBC, TRANSFERRIN, FERRITIN in the last 72 hours. Studies/Results: DG Chest Port 1 View  Result Date: 09/12/2021 CLINICAL DATA:  Shortness of breath EXAM: PORTABLE CHEST 1 VIEW COMPARISON:  Sep 06, 2021 FINDINGS: The cardiomediastinal silhouette is stable. No pneumothorax. Stable calcified nodule in the right base. No new or suspicious nodules, masses, or infiltrates. No other acute  interval changes. IMPRESSION: No active disease. Electronically Signed   By: Dorise Bullion III M.D.   On: 09/12/2021 08:25    Medications: Infusions:    Scheduled Medications:  acetaminophen  1,000 mg Oral Q8H   amoxicillin-clavulanate  1 tablet Oral Q12H   apixaban  5 mg Oral BID   brimonidine  1 drop Both Eyes Daily   chlorhexidine  15 mL Mouth Rinse BID   clonazePAM  0.5 mg Oral QHS   ferrous sulfate  325 mg Oral BID WC   furosemide  40 mg Intravenous Once   isosorbide mononitrate  15 mg Oral QPM   latanoprost  1 drop Both Eyes QHS   levothyroxine  125 mcg Oral Daily   mouth rinse  15 mL Mouth Rinse q12n4p   pantoprazole  40 mg Oral BID   sodium chloride  1 g Oral TID WC   vancomycin  125 mg Oral BID    have reviewed scheduled and prn medications.  Physical Exam: General: Elderly female looks more alert awake. Heart:RRR, s1s2 nl Lungs: Clear bilateral, no wheezing or crackle. Abdomen:soft, Non-tender, non-distended Extremities: Trace peripheral edema Neurology: Alert, awake and following commands  Brae Gartman Prasad Anona Giovannini 09/12/2021,10:05 AM  LOS: 5 days

## 2021-09-13 DIAGNOSIS — E871 Hypo-osmolality and hyponatremia: Secondary | ICD-10-CM | POA: Diagnosis not present

## 2021-09-13 LAB — CBC WITH DIFFERENTIAL/PLATELET
Abs Immature Granulocytes: 0.04 10*3/uL (ref 0.00–0.07)
Basophils Absolute: 0.1 10*3/uL (ref 0.0–0.1)
Basophils Relative: 1 %
Eosinophils Absolute: 0.7 10*3/uL — ABNORMAL HIGH (ref 0.0–0.5)
Eosinophils Relative: 7 %
HCT: 36.9 % (ref 36.0–46.0)
Hemoglobin: 12.1 g/dL (ref 12.0–15.0)
Immature Granulocytes: 0 %
Lymphocytes Relative: 28 %
Lymphs Abs: 2.9 10*3/uL (ref 0.7–4.0)
MCH: 29 pg (ref 26.0–34.0)
MCHC: 32.8 g/dL (ref 30.0–36.0)
MCV: 88.5 fL (ref 80.0–100.0)
Monocytes Absolute: 1.3 10*3/uL — ABNORMAL HIGH (ref 0.1–1.0)
Monocytes Relative: 13 %
Neutro Abs: 5.4 10*3/uL (ref 1.7–7.7)
Neutrophils Relative %: 51 %
Platelets: 283 10*3/uL (ref 150–400)
RBC: 4.17 MIL/uL (ref 3.87–5.11)
RDW: 18.8 % — ABNORMAL HIGH (ref 11.5–15.5)
WBC: 10.5 10*3/uL (ref 4.0–10.5)
nRBC: 0 % (ref 0.0–0.2)

## 2021-09-13 LAB — COMPREHENSIVE METABOLIC PANEL
ALT: 24 U/L (ref 0–44)
AST: 27 U/L (ref 15–41)
Albumin: 2.7 g/dL — ABNORMAL LOW (ref 3.5–5.0)
Alkaline Phosphatase: 85 U/L (ref 38–126)
Anion gap: 11 (ref 5–15)
BUN: 20 mg/dL (ref 8–23)
CO2: 27 mmol/L (ref 22–32)
Calcium: 9.2 mg/dL (ref 8.9–10.3)
Chloride: 92 mmol/L — ABNORMAL LOW (ref 98–111)
Creatinine, Ser: 0.83 mg/dL (ref 0.44–1.00)
GFR, Estimated: >60 mL/min (ref >60)
Glucose, Bld: 102 mg/dL — ABNORMAL HIGH (ref 70–99)
Potassium: 3.6 mmol/L (ref 3.5–5.1)
Sodium: 130 mmol/L — ABNORMAL LOW (ref 135–145)
Total Bilirubin: 0.4 mg/dL (ref 0.3–1.2)
Total Protein: 6.4 g/dL — ABNORMAL LOW (ref 6.5–8.1)

## 2021-09-13 LAB — MAGNESIUM: Magnesium: 1.6 mg/dL — ABNORMAL LOW (ref 1.7–2.4)

## 2021-09-13 MED ORDER — FUROSEMIDE 10 MG/ML IJ SOLN
20.0000 mg | Freq: Once | INTRAMUSCULAR | Status: AC
  Administered 2021-09-13: 20 mg via INTRAVENOUS
  Filled 2021-09-13: qty 2

## 2021-09-13 MED ORDER — POTASSIUM CHLORIDE CRYS ER 20 MEQ PO TBCR
40.0000 meq | EXTENDED_RELEASE_TABLET | Freq: Once | ORAL | Status: AC
  Administered 2021-09-13: 40 meq via ORAL
  Filled 2021-09-13: qty 2

## 2021-09-13 MED ORDER — FUROSEMIDE 20 MG PO TABS
20.0000 mg | ORAL_TABLET | Freq: Every day | ORAL | Status: DC
  Administered 2021-09-14 – 2021-09-17 (×4): 20 mg via ORAL
  Filled 2021-09-13 (×4): qty 1

## 2021-09-13 MED ORDER — MAGNESIUM SULFATE 4 GM/100ML IV SOLN
4.0000 g | Freq: Once | INTRAVENOUS | Status: AC
  Administered 2021-09-13: 4 g via INTRAVENOUS
  Filled 2021-09-13: qty 100

## 2021-09-13 NOTE — Progress Notes (Signed)
PROGRESS NOTE        PATIENT DETAILS Name: Brooke Beasley Age: 88 y.o. Sex: female Date of Birth: [birth year] Admit Date: 09/06/2021 Admitting Physician Shela Leff, MD [id number], Garlon Hatchet, MD  Brief Summary: Patient is a 88 y.o.  female with recent history of L5-S1 discitis (Proteus/Citrobacter)-April 2023-no longer on IV Zosyn-now on oral Augmentin until 5/22-recently started on chlorthalidone by outpatient MD-presented to the hospital with 4-day history of nausea/poor oral intake (mostly consuming liquids)-found to have severe hyponatremia and subsequently admitted to the hospitalist service.   Significant events: 3/30-4/13>> hospitalization for L5-S1 discitis/abscess posterior to rectum-cultures Proteus/Citrobacter-discharged on IV Zosyn-stop date 5/22.  New onset A-fib-started on anticoagulation. 5/03>> outpatient MRI-resolution of L5-S1 epidural abscess, persistent L5-S1 discitis/osteomyelitis. 5/11>> due to persistent leukocytosis-intermittent fevers-PICC line removed on 5/5-transition to Augmentin by ID in the outpatient setting. 5/14>> admit for hyponatremia. 5/15>> no response to IVF-renal consult-to ICU for 3% NaCl.   5/17 >> transfer back to [facility]-off 3% NaCl since 5/16.   Significant studies: 5/14>> CXR: No PNA 5/14>> CT head: No acute abnormality. 5/15>> TSH: 18.4 5/15>> cortisol a.m.: 31.4  Significant microbiology data:   Procedures:   Consults: Nephrology, PCCM  Subjective:   Patient in bed, appears comfortable, denies any headache, no fever, no chest pain or pressure, no shortness of breath , no abdominal pain. No new focal weakness.    Objective: Vitals: Blood pressure (!) 142/74, pulse 79, temperature 97.6 F (36.4 C), temperature source Oral, resp. rate 19, height '5\' 1"'$  (1.549 m), weight 72.9 kg, SpO2 92 %.   Exam:  Awake Alert, No new F.N deficits, Normal affect [place].AT,PERRAL Supple Neck, No JVD,    Symmetrical Chest wall movement, Good air movement bilaterally, CTAB RRR,No Gallops, Rubs or new Murmurs,  +ve B.Sounds, Abd Soft, No tenderness,   No Cyanosis, Clubbing or edema     Assessment/Plan:  Hyponatremia: Seen by nephrology, initially required 3% normal saline in ICU, likely due to SIADH, Zoloft on hold, placed on fluid restriction, counseled on fluid restriction, continue gentle Lasix, sodium improving continue to monitor Case discussed with nephrology on 09/12/2021.  Intractable nausea: Unclear etiology-significant improvement-but continues to have intermittent nausea.  Initially on scheduled Compazine-now changed to as needed.    L 5-S1 discitis/abscess: Recent scans reassuring-on antimicrobial therapy until 09/14/21.  Follows with ID-Dr. Juleen China in the outpatient setting.  History of C. difficile: On prophylactic oral vancomycin as patient is on antibiotics for discitis.  Stop date 09/21/2021  HTN: BP reasonable-continue metoprolol, Imdur.  Likely will discharge on Lasix low-dose.  Persistent atrial fibrillation: Continue Lopressor-on Eliquis.  Chronic HFpEF: Volume status stable.  On furosemide.  Aortic valve stenosis: Stable for outpatient monitoring with periodic echo  Hypothyroidism: TSH significantly elevated-Synthroid has been increased to 150 mcg.  Repeat TSH in 3 months.  Anxiety/depression: Continue to hold Zoloft on discharge due to possible contribution to SIADH physiology causing hyponatremia-see above-continue Klonopin.  ?  OSA: Formal diagnosis currently on CPAP at nighttime, discussed with respiratory therapist on 09/12/2021, will give trial of nighttime oxygen if desaturates then CPAP here.  Social worker will try and see if CPAP will be approved by insurance company in case she requires it.   BMI: Estimated body mass index is 30.37 kg/m as calculated from the following:   Height as of this encounter: '5\' 1"'$  (1.549 m).   Weight  as of this encounter: 72.9  kg.   Code status:   Code Status: DNR   DVT Prophylaxis: SCDs Start: 09/07/21 0430 apixaban (ELIQUIS) tablet 5 mg    Family Communication: Daughter at bedside on 09/12/2021   Disposition Plan: Status is: Inpatient Remains inpatient appropriate because: Improving hyponatremia-noted stable for discharge.   Planned Discharge Destination:Home health   Diet: Diet Order             Diet regular Room service appropriate? Yes; Fluid consistency: Thin; Fluid restriction: 1200 mL Fluid  Diet effective now                    MEDICATIONS: Scheduled Meds:  acetaminophen  1,000 mg Oral Q8H   amoxicillin-clavulanate  1 tablet Oral Q12H   apixaban  5 mg Oral BID   brimonidine  1 drop Both Eyes Daily   chlorhexidine  15 mL Mouth Rinse BID   clonazePAM  0.5 mg Oral QHS   ferrous sulfate  325 mg Oral BID WC   [START ON 09/14/2021] furosemide  20 mg Oral Daily   isosorbide mononitrate  15 mg Oral QPM   latanoprost  1 drop Both Eyes QHS   levothyroxine  125 mcg Oral Daily   mouth rinse  15 mL Mouth Rinse q12n4p   pantoprazole  40 mg Oral BID   vancomycin  125 mg Oral BID   Continuous Infusions:   PRN Meds:.atropine, bisacodyl, diclofenac Sodium, hydrALAZINE, ketorolac, ondansetron **OR** ondansetron (ZOFRAN) IV, prochlorperazine   I have personally reviewed following labs and imaging studies  LABORATORY DATA:  Signature  Lala Lund M.D on 09/13/2021 at 10:52 AM   -  To page go to [email or web address]      RADIOLOGY STUDIES/RESULTS: DG Chest Port 1 View  Result Date: 09/12/2021 CLINICAL DATA:  Shortness of breath EXAM: PORTABLE CHEST 1 VIEW COMPARISON:  Sep 06, 2021 FINDINGS: The cardiomediastinal silhouette is stable. No pneumothorax. Stable calcified nodule in the right base. No new or suspicious nodules, masses, or infiltrates. No other acute interval changes. IMPRESSION: No active disease. Electronically Signed   By: Dorise Bullion III M.D.   On: 09/12/2021 08:25      LOS: 6 days   Signature  Lala Lund M.D on 09/13/2021 at 10:52 AM   -  To page go to [email or web address]

## 2021-09-13 NOTE — Plan of Care (Signed)

## 2021-09-13 NOTE — Progress Notes (Signed)
Doctor wanted to patient to use just nasal cannula for the night. Patient on oxygen set at 2lpm.

## 2021-09-14 ENCOUNTER — Other Ambulatory Visit (HOSPITAL_COMMUNITY): Payer: Self-pay | Admitting: Radiology

## 2021-09-14 ENCOUNTER — Inpatient Hospital Stay (HOSPITAL_COMMUNITY): Payer: PPO

## 2021-09-14 DIAGNOSIS — E871 Hypo-osmolality and hyponatremia: Secondary | ICD-10-CM | POA: Diagnosis not present

## 2021-09-14 LAB — COMPREHENSIVE METABOLIC PANEL
ALT: 23 U/L (ref 0–44)
AST: 26 U/L (ref 15–41)
Albumin: 2.6 g/dL — ABNORMAL LOW (ref 3.5–5.0)
Alkaline Phosphatase: 87 U/L (ref 38–126)
Anion gap: 8 (ref 5–15)
BUN: 21 mg/dL (ref 8–23)
CO2: 29 mmol/L (ref 22–32)
Calcium: 9 mg/dL (ref 8.9–10.3)
Chloride: 95 mmol/L — ABNORMAL LOW (ref 98–111)
Creatinine, Ser: 0.81 mg/dL (ref 0.44–1.00)
GFR, Estimated: >60 mL/min (ref >60)
Glucose, Bld: 117 mg/dL — ABNORMAL HIGH (ref 70–99)
Potassium: 3.7 mmol/L (ref 3.5–5.1)
Sodium: 132 mmol/L — ABNORMAL LOW (ref 135–145)
Total Bilirubin: 0.6 mg/dL (ref 0.3–1.2)
Total Protein: 6 g/dL — ABNORMAL LOW (ref 6.5–8.1)

## 2021-09-14 LAB — PULMONARY FUNCTION TEST
FEF 25-75 Pre: 0.89 L/sec
FEF2575-%Pred-Pre: 108 %
FEV1-%Pred-Pre: 97 %
FEV1-Pre: 1.34 L
FEV1FVC-%Pred-Pre: 99 %
FEV6-%Pred-Pre: 104 %
FEV6-Pre: 1.84 L
FEV6FVC-%Pred-Pre: 106 %
FVC-%Pred-Pre: 98 %
FVC-Pre: 1.87 L
Pre FEV1/FVC ratio: 72 %
Pre FEV6/FVC Ratio: 98 %

## 2021-09-14 LAB — MAGNESIUM: Magnesium: 2.2 mg/dL (ref 1.7–2.4)

## 2021-09-14 MED ORDER — POTASSIUM CHLORIDE CRYS ER 20 MEQ PO TBCR
40.0000 meq | EXTENDED_RELEASE_TABLET | Freq: Once | ORAL | Status: AC
  Administered 2021-09-14: 40 meq via ORAL
  Filled 2021-09-14: qty 2

## 2021-09-14 MED ORDER — LEVOTHYROXINE SODIUM 125 MCG PO TABS: 125.0000 ug | ORAL_TABLET | Freq: Every day | ORAL | 0 refills | Status: DC

## 2021-09-14 NOTE — Plan of Care (Signed)

## 2021-09-14 NOTE — Progress Notes (Signed)
Occupational Therapy Treatment Patient Details Name: Brooke Beasley MRN: [id number] DOB: 09/23/1930 Today's Date: 09/14/2021   History of present illness 88 year old white female presents to the ER 09/07/21 with several days of nausea, vomiting, weakness. +hyponatremia,  PMH atrial fibrillation, discitis after a perirectal abscess, hypertension, CKD stage IIIa, mitral regurg, hypothyroidism.   OT comments  Pt progressing towards established OT goals. Pt performing grooming at sink and functional mobility at Supervision level. Pt performing functional mobility in hallway with Min Guard and RW. SpO2 >88% on RA. Continue to recommend dc to home with follow up from [facility]. Will continue to follow acutely as admitted.    Recommendations for follow up therapy are one component of a multi-disciplinary discharge planning process, led by the attending physician.  Recommendations may be updated based on patient status, additional functional criteria and insurance authorization.    Follow Up Recommendations  Home health OT    Assistance Recommended at Discharge Frequent or constant Supervision/Assistance  Patient can return home with the following  A little help with walking and/or transfers;A little help with bathing/dressing/bathroom   Equipment Recommendations  None recommended by OT    Recommendations for Other Services      Precautions / Restrictions Precautions Precautions: Fall Precaution Comments: watch HR Restrictions Weight Bearing Restrictions: No       Mobility Bed Mobility Overal bed mobility: Needs Assistance Bed Mobility: Supine to Sit, Sit to Supine     Supine to sit: Supervision Sit to supine: Supervision   General bed mobility comments: Supervision for safety    Transfers Overall transfer level: Needs assistance Equipment used: Rolling walker (2 wheels) Transfers: Sit to/from Stand Sit to Stand: Supervision           General transfer comment:  supervision for safety     Balance Overall balance assessment: Needs assistance Sitting-balance support: No upper extremity supported, Feet unsupported Sitting balance-Leahy Scale: Fair     Standing balance support: During functional activity Standing balance-Leahy Scale: Fair                             ADL either performed or assessed with clinical judgement   ADL Overall ADL's : Needs assistance/impaired     Grooming: Wash/dry face;Supervision/safety;Standing                   Toilet Transfer: Supervision/safety;Ambulation;Rolling walker (2 wheels) Toilet Transfer Details (indicate cue type and reason): Supervision for safety         Functional mobility during ADLs: Min guard;Rolling walker (2 wheels) General ADL Comments: Pt performing grooming at sink and then functional mobility in hallway.    Extremity/Trunk Assessment Upper Extremity Assessment Upper Extremity Assessment: Generalized weakness   Lower Extremity Assessment Lower Extremity Assessment: Defer to PT evaluation        Vision       Perception     Praxis      Cognition Arousal/Alertness: Awake/alert Behavior During Therapy: Flat affect Overall Cognitive Status: Within Functional Limits for tasks assessed                                 General Comments: looking forward to getting home, good understanding of need for CPAP        Exercises      Shoulder Instructions       General Comments HR elevating to 110-120s with activity.  SpO2 >88% on RA.    Pertinent Vitals/ Pain       Pain Assessment Pain Assessment: No/denies pain  Home Living                                          Prior Functioning/Environment              Frequency  Min 3X/week        Progress Toward Goals  OT Goals(current goals can now be found in the care plan section)  Progress towards OT goals: Progressing toward goals  Acute Rehab OT Goals OT  Goal Formulation: With patient/family Time For Goal Achievement: 09/22/21 Potential to Achieve Goals: Good ADL Goals Pt Will Perform Grooming: with set-up;with supervision;standing Pt Will Perform Lower Body Dressing: with min guard assist;sit to/from stand;with adaptive equipment Pt Will Transfer to Toilet: with supervision;ambulating;regular height toilet Pt Will Perform Toileting - Clothing Manipulation and hygiene: with supervision;sitting/lateral leans;sit to/from stand  Plan Discharge plan remains appropriate    Co-evaluation                 AM-PAC OT "6 Clicks" Daily Activity     Outcome Measure   Help from another person eating meals?: A Little Help from another person taking care of personal grooming?: A Little Help from another person toileting, which includes using toliet, bedpan, or urinal?: A Lot Help from another person bathing (including washing, rinsing, drying)?: A Lot Help from another person to put on and taking off regular upper body clothing?: A Little Help from another person to put on and taking off regular lower body clothing?: A Lot 6 Click Score: 15    End of Session Equipment Utilized During Treatment: Rolling walker (2 wheels)  OT Visit Diagnosis: Unsteadiness on feet (R26.81);Other abnormalities of gait and mobility (R26.89);Muscle weakness (generalized) (M62.81)   Activity Tolerance Patient tolerated treatment well   Patient Left in bed;with call bell/phone within reach;with bed alarm set   Nurse Communication Mobility status        Time: 1443-1500 OT Time Calculation (min): 17 min  Charges: OT General Charges $OT Visit: 1 Visit OT Treatments $Self Care/Home Management : 8-22 mins  [place], OTR/L Acute Rehab Pager: [phone] Office: [place] 09/14/2021, 3:11 PM

## 2021-09-14 NOTE — Progress Notes (Signed)
Per Dr. Note patient was to use just nasal canal overnight.  Patient on 2L/[place].  Patient continued to desat when sleeping dropping as low as 78%.  When alarms would sound patient would wake up and oxygen levels would return to baseline 95%-98% on 2L.  Patient placed back on her c-pap.

## 2021-09-14 NOTE — Discharge Summary (Addendum)
Brooke Beasley:[id number] DOB: 27-Jul-1930 DOA: 09/06/2021  PCP: [name], MD  Admit date: 09/06/2021  Discharge date: 09/16/2021  Admitted From: Home   Disposition:  Home   Recommendations for Outpatient Follow-up:   Follow up with PCP in 1-2 weeks  PCP Please obtain BMP/CBC, 2 view CXR in 1week,  (see Discharge instructions)   PCP Please follow up on the following pending results: Monitor BMP closely, needs outpatient sleep study for OSA and to qualify for CPAP, recheck TSH in 3 months.   Home Health: PT, RN if she qualifies Equipment/Devices: as below  Consultations: Renal Discharge Condition: Stable    CODE STATUS: Full    Diet Recommendation: Heart Healthy with strict 1.2 L fluid restriction per day  Diet Order             Diet - low sodium heart healthy           Diet regular Room service appropriate? Yes; Fluid consistency: Thin; Fluid restriction: 1200 mL Fluid  Diet effective now                    Chief Complaint  Patient presents with   Hypertension     Brief history of present illness from the day of admission and additional interim summary    88 y.o.  female with recent history of L5-S1 discitis (Proteus/Citrobacter)-April 2023-no longer on IV Zosyn-now on oral Augmentin until 5/22-recently started on chlorthalidone by outpatient MD-presented to the hospital with 4-day history of nausea/poor oral intake (mostly consuming liquids)-found to have severe hyponatremia and subsequently admitted to the hospitalist service.     Significant events: 3/30-4/13>> hospitalization for L5-S1 discitis/abscess posterior to rectum-cultures Proteus/Citrobacter-discharged on IV Zosyn-stop date 5/22.  New onset A-fib-started on anticoagulation. 5/03>> outpatient MRI-resolution of L5-S1  epidural abscess, persistent L5-S1 discitis/osteomyelitis. 5/11>> due to persistent leukocytosis-intermittent fevers-PICC line removed on 5/5-transition to Augmentin by ID in the outpatient setting. 5/14>> admit for hyponatremia. 5/15>> no response to IVF-renal consult-to ICU for 3% NaCl.   5/17 >> transfer back to [facility]-off 3% NaCl since 5/16.    Significant studies: 5/14>> CXR: No PNA 5/14>> CT head: No acute abnormality. 5/15>> TSH: 18.4 5/15>> cortisol a.m.: 31.4   Significant microbiology data:     Procedures:     Consults: Nephrology, [facility]    Hyponatremia: Seen by nephrology, initially required 3% normal saline in ICU, likely due to SIADH, Zoloft on hold, placed on fluid restriction, counseled on fluid restriction, continue gentle Lasix, sodium has considerably improved, will be discharged on home dose diuretics along with strict fluid restriction patient and daughter both counseled, case discussed with  nephrology.   Intractable nausea: Unclear etiology-with supportive care resolved completely.   L 5-S1 discitis/abscess: Recent scans reassuring-on antimicrobial therapy until 09/14/21, now stopped.  Follows with ID-Dr. Juleen China in the outpatient setting.   History of C. difficile: On prophylactic oral vancomycin as patient is on antibiotics for discitis.  Stop date 09/21/2021   HTN: BP was high, placed on hydralazine and Coreg combination.  PCP to monitor.   Persistent atrial fibrillation: Continue Lopressor-on Eliquis.   Chronic HFpEF: Volume status stable.  On Lasix and Aldactone.  Continue.   Aortic valve stenosis: Stable for outpatient monitoring with periodic echo, PCP to arrange for outpatient cardiology follow-ups and echocardiograms.  Hypothyroidism: TSH significantly elevated-Synthroid has been increased to 150 mcg.  Repeat TSH in 3 months by PCP.   Anxiety/depression: Continue to hold  Zoloft on discharge due to possible contribution to SIADH physiology causing hyponatremia-see above-continue Klonopin.   ?  OSA: Patient required CPAP throughout her stay in the hospital at nighttime while she was sleeping, we did try her on 2 L nasal cannula at night but she had extensive.'s of desaturating below 80% on her pulse ox monitor and had to be replaced on CPAP.  We will try and get her home on BiPAP till she can get outpatient sleep study, we will request PCP to kindly arrange for it as soon as possible.  She clinically has OHS causing thoracic restriction, will have RT check bedside spirometry as well.   Discharge diagnosis     Principal Problem:   Hyponatremia Active Problems:   Hypertension, essential   Hypothyroidism   Valvular heart disease   Stage 3a chronic kidney disease (CKD) (HCC)   Discitis of lumbosacral region   Persistent atrial fibrillation (HCC)   DNR (do not resuscitate)/DNI(Do Not Intubate    Discharge instructions    Discharge Instructions     Diet - low sodium heart healthy   Complete by: As directed    Discharge instructions   Complete by: As directed    Follow with Primary MD [name], MD in 7 days, please get referred for outpatient sleep study.  Get CBC, CMP, 2 view Chest X ray -  checked next visit within 1 week by Primary MD   Activity: As tolerated with Full fall precautions use walker/cane & assistance as needed  Disposition Home   Diet: Heart Healthy strict 1.2 L fluid restriction per day  Special Instructions: If you have smoked or chewed Tobacco  in the last 2 yrs please stop smoking, stop any regular Alcohol  and or any Recreational drug use.  On your next visit with your primary care physician please Get Medicines reviewed and adjusted.  Please request your Prim.MD to go over all Hospital Tests and Procedure/Radiological results at the follow up, please get all Hospital records sent to your Prim MD by signing hospital  release before you go home.  If you experience worsening of your admission symptoms, develop shortness of breath, life threatening emergency, suicidal or homicidal thoughts you must seek medical attention immediately by calling 911 or calling your MD immediately  if symptoms less severe.  You Must read complete instructions/literature along with all the possible adverse reactions/side effects for all the Medicines you take and that have been prescribed to you. Take any new Medicines after you have completely understood and accpet all the possible adverse reactions/side effects.   For home use only DME 4 wheeled rolling walker with seat   Complete by: As directed  Patient needs a walker to treat with the following condition: Weakness   Increase activity slowly   Complete by: As directed        Discharge Medications   Allergies as of 09/16/2021       Reactions   Amlodipine Other (See Comments)   Stomach pains--"feel like my insides are on fire"   Ace Inhibitors Cough   Carvedilol Other (See Comments)   fatigue   Losartan Other (See Comments)   Cr bumped   Other Other (See Comments)   No seeds or corn because of IBS   Paroxetine Hcl Other (See Comments)   Not effective - felt ill on this medicine   Risedronate Sodium    REACTION: joint pain   Simvastatin    REACTION: the full 20 mg pill causes leg pain- can tol 10 mg   Toprol Xl [metoprolol] Diarrhea   Diarrhea and weakness   Alendronate Sodium Palpitations   Influenza Vaccines Rash, Other (See Comments)   Led to mental breakdown in 1960s   Silenor [doxepin Hcl] Palpitations   Hallucinations and racing heart   Sulfonamide Derivatives Rash        Medication List     STOP taking these medications    amoxicillin-clavulanate 875-125 MG tablet Commonly known as: Augmentin   metoprolol tartrate 50 MG tablet Commonly known as: LOPRESSOR   sertraline 100 MG tablet Commonly known as: ZOLOFT       TAKE these  medications    acetaminophen 500 MG tablet Commonly known as: TYLENOL Take 500-1,000 mg by mouth every 6 (six) hours as needed for moderate pain. Depends on pain   albuterol 108 (90 Base) MCG/ACT inhaler Commonly known as: VENTOLIN HFA Inhale 2 puffs into the lungs every 6 (six) hours as needed for wheezing or shortness of breath.   Alphagan P 0.1 % Soln Generic drug: brimonidine Place 1 drop into both eyes daily.   apixaban 5 MG Tabs tablet Commonly known as: ELIQUIS Take 1 tablet (5 mg total) by mouth 2 (two) times daily.   bimatoprost 0.01 % Soln Commonly known as: LUMIGAN Place 1 drop into both eyes at bedtime.   carvedilol 6.25 MG tablet Commonly known as: COREG Take 1 tablet (6.25 mg total) by mouth 2 (two) times daily with a meal.   cholecalciferol 1000 units tablet Commonly known as: VITAMIN D Take 1,000 Units by mouth daily.   clonazePAM 0.5 MG tablet Commonly known as: KLONOPIN Take 1 tablet (0.5 mg total) by mouth at bedtime.   colchicine 0.6 MG tablet Take 1 tablet (0.6 mg total) by mouth daily as needed (gout flare).   Dermacloud Oint Apply 1 application topically 2 (two) times daily as needed. What changed: reasons to take this   Dulcolax 5 MG EC tablet Generic drug: bisacodyl Take 1 tablet (5 mg total) by mouth daily as needed for moderate constipation.   ferrous sulfate 325 (65 FE) MG tablet Take 1 tablet (325 mg total) by mouth 2 (two) times daily with a meal.   furosemide 20 MG tablet Commonly known as: LASIX Take 20 mg by mouth daily as needed for edema or fluid.   Fusion Plus Caps Take 1 capsule by mouth once daily   gabapentin 400 MG capsule Commonly known as: NEURONTIN Take 400 mg by mouth daily.   hydrALAZINE 25 MG tablet Commonly known as: APRESOLINE Take 1 tablet (25 mg total) by mouth every 8 (eight) hours.   hydrocortisone 2.5 % cream Place 1 application  rectally daily as needed (hemorrhoids).   levothyroxine 125 MCG  tablet Commonly known as: SYNTHROID Take 1 tablet (125 mcg total) by mouth daily. What changed:  medication strength how much to take   loperamide 2 MG capsule Commonly known as: IMODIUM Take 1 capsule (2 mg total) by mouth 3 (three) times daily as needed for diarrhea or loose stools.   MULTIVITAMIN ADULT PO Take by mouth daily.   omeprazole 20 MG capsule Commonly known as: PRILOSEC Take 20 mg by mouth daily as needed (heartburn).   ondansetron 4 MG disintegrating tablet Commonly known as: ZOFRAN-ODT Take 1 tablet (4 mg total) by mouth every 8 (eight) hours as needed for nausea or vomiting.   OSTEO BI-FLEX TRIPLE STRENGTH PO Take 1 tablet by mouth daily.   PEPCID COMPLETE PO Take 1 tablet by mouth daily as needed (indigestion).   polyethylene glycol 17 g packet Commonly known as: MIRALAX / GLYCOLAX Take 17 g by mouth daily.   Simethicone 180 MG Caps Take 1 capsule by mouth daily as needed (indigestion).   spironolactone 25 MG tablet Commonly known as: ALDACTONE Take 1 tablet (25 mg total) by mouth daily.   vancomycin 125 MG capsule Commonly known as: VANCOCIN Take 1 capsule (125 mg total) by mouth 2 (two) times daily.   Voltaren 1 % Gel Generic drug: diclofenac Sodium APPLY ONE APPLICATION TOPICALLY 3 TIMES DAILY What changed: See the new instructions.               Durable Medical Equipment  (From admission, onward)           Start     Ordered   09/14/21 0000  For home use only DME 4 wheeled rolling walker with seat       Question:  Patient needs a walker to treat with the following condition  Answer:  Weakness   09/14/21 0915             Follow-up Information     [name], MD. Schedule an appointment as soon as possible for a visit in 1 week(s).   Specialty: Family Medicine Contact information: [place] [address] [phone]         Kate Sable, MD .   Specialties: Cardiology,  Radiology Contact information: [place] [address] [place], [facility] Follow up.   Specialty: [place] Why: HHRN/PT/OT- services to resume on discharge- they will contact you to schedule. Contact information: [place] [address] [phone]                 Major procedures and Radiology Reports - PLEASE review detailed and final reports thoroughly  -       CT Head Wo Contrast  Result Date: 09/06/2021 CLINICAL DATA:  Nausea and vomiting. EXAM: CT HEAD WITHOUT CONTRAST TECHNIQUE: Contiguous axial images were obtained from the base of the skull through the vertex without intravenous contrast. RADIATION DOSE REDUCTION: This exam was performed according to the departmental dose-optimization program which includes automated exposure control, adjustment of the mA and/or kV according to patient size and/or use of iterative reconstruction technique. COMPARISON:  January 14, 2021 FINDINGS: Brain: There is mild cerebral atrophy with widening of the extra-axial spaces and ventricular dilatation. There are areas of decreased attenuation within the white matter tracts of the supratentorial brain, consistent with microvascular disease changes. A small chronic left cerebellar  infarct is seen. Vascular: No hyperdense vessel or unexpected calcification. Skull: Normal. Negative for fracture or focal lesion. Sinuses/Orbits: No acute finding. Other: None. IMPRESSION: 1. No acute intracranial abnormality. 2. Generalized cerebral atrophy with chronic white matter small vessel ischemia. Electronically Signed   By: Virgina Norfolk M.D.   On: 09/06/2021 18:46   MR Lumbar Spine W Wo Contrast  Result Date: 08/26/2021 CLINICAL DATA:  Epidural abscess. Epidural abscess, Perirectal abscess, Discitis of lumbosacral region EXAM: MRI LUMBAR SPINE WITHOUT AND WITH CONTRAST TECHNIQUE: Multiplanar and multiecho pulse sequences  of the lumbar spine were obtained without and with intravenous contrast. CONTRAST:  64m GADAVIST GADOBUTROL 1 MMOL/ML IV SOLN COMPARISON:  07/31/2021 FINDINGS: Segmentation:  Standard Alignment:  Grade 1 anterolisthesis at L4-5 Vertebrae: Persistent signal changes at L5-S1 consistent with discitis-osteomyelitis. No progression of endplate erosion. Previously visualized ventral epidural abscess is no longer present. Conus medullaris and cauda equina: Conus extends to the L1 level. Conus and cauda equina appear normal. Paraspinal and other soft tissues: Negative Disc levels: L1-L2: Right asymmetric disc bulge. Right lateral recess narrowing without central spinal canal stenosis. No neural foraminal stenosis. L2-L3: Normal disc space and facet joints. No spinal canal stenosis. No neural foraminal stenosis. L3-L4: Small disc bulge. No spinal canal stenosis. No neural foraminal stenosis. L4-L5: Small disc bulge with moderate facet hypertrophy. Narrowing of both lateral recesses without central spinal canal stenosis. No neural foraminal stenosis. L5-S1: Discitis-osteomyelitis. Moderate facet hypertrophy. No spinal canal stenosis. Unchanged severe right neural foraminal stenosis. Visualized sacrum: Normal. IMPRESSION: 1. Resolution of previously visualized L5-S1 ventral epidural abscess. 2. Persistent signal changes at L5-S1 consistent with discitis-osteomyelitis. No progression of endplate erosion. Unchanged severe right L5-S1 neural foraminal stenosis. 3. Bilateral lateral recess narrowing at L4-L5 due to combination of disc bulge and facet arthrosis. Electronically Signed   By: KUlyses JarredM.D.   On: 08/26/2021 19:12   MR SACRUM SI JOINTS W WO CONTRAST  Result Date: 08/26/2021 CLINICAL DATA:  Epidural abscess, perirectal abscess, diskitis of lumbosacral region EXAM: MRI SACRUM WITHOUT AND WITH CONTRAST TECHNIQUE: Multiplanar and multiecho pulse sequences of the sacrum were obtained without and with intravenous  contrast. CONTRAST:  747mGADAVIST GADOBUTROL 1 MMOL/ML IV SOLN COMPARISON:  MRI 07/31/2021 FINDINGS: Bones/Joint/Cartilage There are persistent signal changes at L5-S1 consistent with discitis osteomyelitis. No progressive endplate erosion. Resolution of previously visualized L5-S1 ventral epidural abscess. Unchanged perineural cyst at S1-S2. There is no acute fracture. No evidence of infectious sacroiliitis. Mild bilateral SI joint degenerative changes. Ligaments Grossly intact. Muscles and Tendons There is persistent muscle edema and atrophy within the iliacus muscles, pelvic floor musculature, and gluteus musculature. This signal is likely reactive or related to de nerve a shin change. Soft tissue Resolution of the previously seen rim enhancing collection posterior to the rectum and collection along the left aspect of the vaginal vault. There is no new rim enhancing fluid collection. IMPRESSION: Persistent signal changes of discitis-osteomyelitis at L5-S1. No progression of endplate erosion. Resolution of prior L5-S1 ventral epidural abscess. No evidence of sacral involvement below the level of S1. No evidence of infectious sacroiliitis. Resolution of prior perirectal fluid collection and additional collection along the left aspect of the vaginal vault. Electronically Signed   By: JaMaurine Simmering.D.   On: 08/26/2021 19:31   DG Chest Port 1 View  Result Date: 09/12/2021 CLINICAL DATA:  Shortness of breath EXAM: PORTABLE CHEST 1 VIEW COMPARISON:  Sep 06, 2021 FINDINGS: The cardiomediastinal silhouette is stable. No pneumothorax. Stable  calcified nodule in the right base. No new or suspicious nodules, masses, or infiltrates. No other acute interval changes. IMPRESSION: No active disease. Electronically Signed   By: Dorise Bullion III M.D.   On: 09/12/2021 08:25   DG Chest Port 1 View  Result Date: 09/06/2021 CLINICAL DATA:  Shortness of breath. EXAM: PORTABLE CHEST 1 VIEW COMPARISON:  08/04/2021 and older  exams. FINDINGS: Mild stable enlargement of the cardiac silhouette. No mediastinal or hilar masses. Chronic interstitial thickening. Mild opacity at the left lung base consistent with a small effusion and atelectasis, decreased compared to the prior exam. No right pleural effusion. No pneumothorax. Skeletal structures are diffusely demineralized. Partly imaged left shoulder prosthesis, stable. IMPRESSION: 1. No acute cardiopulmonary disease. 2. Small left pleural effusion with associated atelectasis, decreased compared to the most recent prior study. 3. Chronic interstitial thickening and mild stable cardiomegaly. Electronically Signed   By: Lajean Manes M.D.   On: 09/06/2021 18:40     Today   Subjective    Harnoor Reta today has no headache,no chest abdominal pain,no new weakness tingling or numbness, feels much better wants to go home today.     Objective   Blood pressure (!) 184/95, pulse 86, temperature 97.8 F (36.6 C), temperature source Oral, resp. rate 18, height '5\' 1"'$  (1.549 m), weight 75 kg, SpO2 98 %.   Intake/Output Summary (Last 24 hours) at 09/16/2021 1056 Last data filed at 09/16/2021 0600 Gross per 24 hour  Intake --  Output 1200 ml  Net -1200 ml    Exam  Awake Alert, No new F.N deficits,    Spray.AT,PERRAL Supple Neck,   Symmetrical Chest wall movement, Good air movement bilaterally, CTAB RRR,No Gallops,   +ve B.Sounds, Abd Soft, Non tender,  No Cyanosis, Clubbing or edema    Data Review   CBC w Diff:  Lab Results  Component Value Date   WBC 10.5 09/13/2021   HGB 12.1 09/13/2021   HGB 13.7 04/09/2021   HCT 36.9 09/13/2021   HCT 42.0 04/09/2021   PLT 283 09/13/2021   PLT 259 04/09/2021   LYMPHOPCT 28 09/13/2021   MONOPCT 13 09/13/2021   EOSPCT 7 09/13/2021   BASOPCT 1 09/13/2021    CMP:  Lab Results  Component Value Date   NA 134 (L) 09/16/2021   NA 136 03/27/2021   K 3.7 09/16/2021   CL 100 09/16/2021   CO2 28 09/16/2021   BUN 16 09/16/2021    BUN 22 03/27/2021   CREATININE 0.82 09/16/2021   CREATININE 1.07 (H) 08/26/2021   PROT 5.9 (L) 09/16/2021   PROT 6.8 03/27/2021   ALBUMIN 2.6 (L) 09/16/2021   ALBUMIN 4.3 03/27/2021   BILITOT 0.2 (L) 09/16/2021   BILITOT 0.3 03/27/2021   ALKPHOS 85 09/16/2021   AST 22 09/16/2021   ALT 18 09/16/2021  .   Total Time in preparing paper work, data evaluation and todays exam - 61 minutes  Lala Lund M.D on 09/16/2021 at 10:56 AM  [facility]

## 2021-09-14 NOTE — Progress Notes (Signed)
Physical Therapy Treatment Patient Details Name: Brooke Beasley MRN: [id number] DOB: 04-07-1931 Today's Date: 09/14/2021   History of Present Illness 88 year old white female presents to the ER 09/07/21 with several days of nausea, vomiting, weakness. +hyponatremia,  PMH atrial fibrillation, discitis after a perirectal abscess, hypertension, CKD stage IIIa, mitral regurg, hypothyroidism.    PT Comments    Pt is looking forward to going home today. Pt is making good progress towards her goals. Focus of session education on need for activity throughout the day with ambulation and with exercise. Pt expresses desire to get outside to her garden. Encouraged pt to ask HHPT to include in treatment plan. Pt is now supervision for transfers and min guard for ambulation with RW. D/c plan remains appropriate.     Recommendations for follow up therapy are one component of a multi-disciplinary discharge planning process, led by the attending physician.  Recommendations may be updated based on patient status, additional functional criteria and insurance authorization.  Follow Up Recommendations  Home health PT     Assistance Recommended at Discharge Intermittent Supervision/Assistance  Patient can return home with the following A little help with walking and/or transfers;A little help with bathing/dressing/bathroom;Assistance with cooking/housework;Help with stairs or ramp for entrance   Equipment Recommendations  None recommended by PT       Precautions / Restrictions Precautions Precautions: Fall Precaution Comments: watch HR Restrictions Weight Bearing Restrictions: No     Mobility  Bed Mobility               General bed mobility comments: up in recliner on entry    Transfers Overall transfer level: Needs assistance Equipment used: Rolling walker (2 wheels) Transfers: Sit to/from Stand Sit to Stand: Supervision           General transfer comment: supervision for  power up and steadying before reaching for RW    Ambulation/Gait Ambulation/Gait assistance: Min guard Gait Distance (Feet): 20 Feet Assistive device: Rolling walker (2 wheels) Gait Pattern/deviations: Step-through pattern, Decreased step length - right, Decreased step length - left, Shuffle, Trunk flexed Gait velocity: slow Gait velocity interpretation: <1.31 ft/sec, indicative of household ambulator   General Gait Details: min guard for ambulation to door and back, reports she was able to walk to the bathroom just a little while prior to PT entering         Balance Overall balance assessment: Needs assistance Sitting-balance support: No upper extremity supported, Feet unsupported Sitting balance-Leahy Scale: Fair     Standing balance support: During functional activity Standing balance-Leahy Scale: Fair Standing balance comment: able to static stand without assist, needs at least single UE support for dynamic activity                            Cognition Arousal/Alertness: Awake/alert Behavior During Therapy: Flat affect Overall Cognitive Status: Within Functional Limits for tasks assessed                                 General Comments: looking forward to getting home, good understanding of need for CPAP        Exercises General Exercises - Lower Extremity Ankle Circles/Pumps: AROM, Both, 10 reps, Seated Long Arc Quad: AROM, Both, 10 reps, Seated Hip Flexion/Marching: AROM, Both, 10 reps, Seated    General Comments General comments (skin integrity, edema, etc.): HR 122 bpm with ambulation, SpO2 95%O2 on  RA with ambulation, daughter in room throughout therapy. Educated on need for getting up and moving every hour. Pt understanding and reports she would like to be able to get out in her garden. Encouraged pt to ask HHPT to incorportate in therapy      Pertinent Vitals/Pain Pain Assessment Pain Assessment: No/denies pain     PT Goals  (current goals can now be found in the care plan section) Acute Rehab PT Goals Patient Stated Goal: to feel better PT Goal Formulation: With patient Time For Goal Achievement: 09/21/21 Potential to Achieve Goals: Good Progress towards PT goals: Progressing toward goals    Frequency    Min 3X/week      PT Plan Current plan remains appropriate       AM-PAC PT "6 Clicks" Mobility   Outcome Measure  Help needed turning from your back to your side while in a flat bed without using bedrails?: A Little Help needed moving from lying on your back to sitting on the side of a flat bed without using bedrails?: A Little Help needed moving to and from a bed to a chair (including a wheelchair)?: A Little Help needed standing up from a chair using your arms (e.g., wheelchair or bedside chair)?: A Little Help needed to walk in hospital room?: A Lot (mod cues and chair follow) Help needed climbing 3-5 steps with a railing? : Total 6 Click Score: 15    End of Session Equipment Utilized During Treatment: Gait belt Activity Tolerance: Patient tolerated treatment well Patient left: in chair;with family/visitor present;with call bell/phone within reach Nurse Communication: Mobility status PT Visit Diagnosis: Difficulty in walking, not elsewhere classified (R26.2);Muscle weakness (generalized) (M62.81)     Time: 0240-9735 PT Time Calculation (min) (ACUTE ONLY): 28 min  Charges:  $Gait Training: 8-22 mins $Therapeutic Exercise: 8-22 mins                     Brooke Brooke Beasley Beasley PT, DPT Acute Rehabilitation Services Please use secure chat or  Call Office [phone]    [place] 09/14/2021, 10:45 AM

## 2021-09-14 NOTE — Progress Notes (Signed)
Pt placed on home CPAP by family member. Pt also placed on overnight pulse oximetry. RT will cont to monitor.

## 2021-09-14 NOTE — TOC Initial Note (Signed)
Transition of Care (TOC) - Initial/Assessment Note  Marvetta Gibbons RN, BSN Transitions of Care Unit 4E- RN Case Manager See Treatment Team for direct phone #  Cross Coverage for 5W  Patient Details  Name: Brooke Beasley MRN: [id number] Date of Birth: 07-15-1930  Transition of Care [facility]) CM/SW Contact:    Dawayne Patricia, RN Phone Number: 09/14/2021, 3:15 PM  Clinical Narrative:                 Received msg this AM from MD that pt stable for transition home, however needed Cpap for home. Pt has not had sleep study and would not be eligible for CPAP at home based on documentation in chart. Spoke with Thedore Mins at [employer] to see if pt may be eligible for NIV/BIPAP. Per Thedore Mins pt does not meet criteria for NIV either based on current documentation. Lacks dx, and supporting documentation. Discussed with MD and MD will document needed information, as well as order bedside spirometry test by RT. Once test by RT and documentation in epic then [place] with [employer] will move forward to see if insurance will approve an NIV for home. If pt is not approved for an NIV then pt will need to f/u outpt with a sleep study in order to get a CPAP for home.   1300- spoke with pt and her daughter at bedside, explained insurance/approval process and what barriers currently were in trying to get the equipment she needs for home. Pt states she was told she could go home today and was hoping to return home, but voices she understands we have to wait to see if insurance will approve her equipment. Discussed with pt and daughter [facility] and DME needs. Per pt and daughter pt was active with [facility] prior to admit and would like to continue with them for [facility] services - new orders for HHRN/PT/OT, if Alvis Lemmings unable to resume services then she states she has also used [facility] in past and they would be her backup. List provided Per CMS guidelines from medicare.gov website with star ratings (copy placed in shadow chart) . Pt reports  she has rollator at home, and other than CPAP vs NIV she has no other DME needs.   1500- spoke with RT Judeen Hammans) regarding bedside spirometry testing, per Judeen Hammans the RT that does these test is not here today and likely this test would not get done until tomorrow. Pt would not be able to discharge until testing done and [employer] reviews to see if pt will qualify for home NIV. CM will update pt and daughter at bedside.   Expected Discharge Plan: [place] Barriers to Discharge: Equipment Delay   Patient Goals and CMS Choice Patient states their goals for this hospitalization and ongoing recovery are:: return home CMS Medicare.gov Compare Post Acute Care list provided to:: Patient Choice offered to / list presented to : Patient, Adult Children  Expected Discharge Plan and Services Expected Discharge Plan: [place]   Discharge Planning Services: CM Consult Post Acute Care Choice: Durable Medical Equipment, Home Health Living arrangements for the past 2 months: Single Family Home Expected Discharge Date: 09/14/21               DME Arranged: NIV DME Agency: [facility] Date DME Agency Contacted: 09/14/21 Time DME Agency Contacted: 1000 Representative spoke with at DME Agency: Thedore Mins HH Arranged: RN, PT, OT [facility] Agency: [place] Date [place]: 09/14/21 Time [place]  Contacted: [phone] Representative spoke with at [place]: Tommi Rumps  Prior Living Arrangements/Services Living arrangements for the past 2 months: [place] with:: Self, Adult Children Patient language and need for interpreter reviewed:: Yes Do you feel safe going back to the place where you live?: Yes      Need for Family Participation in Patient Care: Yes (Comment) Care giver support system in place?: Yes (comment) Current home services: DME (rollator) Criminal Activity/Legal Involvement Pertinent to Current Situation/Hospitalization: No - Comment as  needed  Activities of Daily Living Home Assistive Devices/Equipment: Environmental consultant (specify type), Bedside commode/3-in-1, Shower chair with back ADL Screening (condition at time of admission) Patient's cognitive ability adequate to safely complete daily activities?: Yes Is the patient deaf or have difficulty hearing?: Yes Does the patient have difficulty seeing, even when wearing glasses/contacts?: No Does the patient have difficulty concentrating, remembering, or making decisions?: No Patient able to express need for assistance with ADLs?: Yes Does the patient have difficulty dressing or bathing?: Yes Independently performs ADLs?: No Communication: Independent Dressing (OT): Needs assistance Is this a change from baseline?: Pre-admission baseline Grooming: Needs assistance Is this a change from baseline?: Pre-admission baseline Feeding: Independent Bathing: Needs assistance Is this a change from baseline?: Pre-admission baseline Toileting: Needs assistance Is this a change from baseline?: Pre-admission baseline In/Out Bed: Needs assistance Is this a change from baseline?: Pre-admission baseline Walks in Home: Needs assistance Is this a change from baseline?: Pre-admission baseline Does the patient have difficulty walking or climbing stairs?: No Weakness of Legs: Both Weakness of Arms/Hands: None  Permission Sought/Granted Permission sought to share information with : Facility Art therapist granted to share information with : Yes, Verbal Permission Granted     Permission granted to share info w AGENCY: DME/HH        Emotional Assessment Appearance:: Appears stated age Attitude/Demeanor/Rapport: Engaged Affect (typically observed): Appropriate, Pleasant Orientation: : Oriented to Self, Oriented to Place, Oriented to  Time, Oriented to Situation Alcohol / Substance Use: Not Applicable Psych Involvement: No (comment)  Admission diagnosis:  Hyponatremia  [E87.1] General weakness [R53.1] Essential hypertension [I10] Severe comorbid illness [R69] Patient Active Problem List   Diagnosis Date Noted   DNR (do not resuscitate)/DNI(Do Not Intubate 09/07/2021   Discitis of lumbosacral region 07/23/2021   Persistent atrial fibrillation ([place]) 07/23/2021   Radicular low back pain 07/14/2021   Asthmatic bronchitis 06/24/2021   Complete rectal prolapse 05/26/2021   Bilateral hearing loss 05/12/2021   Diverticular disease of left colon 05/12/2021   Itching 03/31/2021   Iron deficiency anemia 02/10/2021   Macular degeneration 12/22/2019   PAC (premature atrial contraction) 07/09/2019   Recurrent Clostridioides difficile diarrhea 07/11/2018   Pedal edema 12/29/2016   Weakness 04/09/2016   Complete tear of left rotator cuff 02/13/2016   Thumb pain, left 12/22/2015   Status post total shoulder replacement 11/27/2015   Pseudogout    Primary osteoarthritis of right shoulder 11/21/2014   Carotid stenosis 10/18/2014   Advanced care planning/counseling discussion 09/26/2014   Diverticulosis of colon without hemorrhage 09/26/2014   Stage 3a chronic kidney disease (CKD) ([place]) 08/02/2014   Restless legs 07/04/2014   Hyponatremia 07/04/2014   Chronic abdominal pain 01/07/2014   Primary osteoarthritis of both hands 11/21/2013   Insomnia 10/23/2013   GERD (gastroesophageal reflux disease) 04/02/2013   Anxiety associated with depression 12/26/2012   Prediabetes 03/20/2012   Valvular heart disease 09/13/2011   Hypertension, essential 02/24/2007   Hypothyroidism 11/02/2006   Glaucoma 11/02/2006  Bleeding hemorrhoids 11/02/2006   Irritable bowel syndrome with constipation 11/02/2006   Osteoporosis 11/02/2006   Osteoarthritis 10/20/2006   PCP:  [name], MD Pharmacy:   [facility] [address], [place] - [place] [address] [place] [address] Phone: [phone] Fax: [phone]  [facility] [place], [place] [address] Phone: [phone] Fax: [phone]  [facility]) - [place], [place] [address] Phone: [phone] Fax: [phone]  [place] ([place], [place] [address] Phone: [phone] Fax: [phone]  [facility] [address] [place] [address] Phone: [phone] Fax: [phone]     Social Determinants of Health (SDOH) Interventions    Readmission Risk Interventions    05/29/2021   11:32 AM  Readmission Risk Prevention Plan  Transportation Screening Complete  PCP or Specialist Appt within 3-5 Days Complete  HRI or Home Care Consult Complete  Palliative Care Screening Not Applicable

## 2021-09-14 NOTE — Progress Notes (Signed)
Brooke Beasley presents with obesity hypoventilation syndrome causing thoracic restriction.  The use of the NIV will treat patient's ventilatory defects. NIV use can reduce risk of exacerbations and future hospitalizations when used at night and during the day.  All alternate devices [phone] and [id number]) have been considered and ruled out as patient requires continuous alarms, backup battery, and portability which are not possible with BiLevel/RAD devices. An NIV with volume-targeted pressure support is necessary to prevent patient from life-threatening harm.  Interruption or failure to provide NIV would quickly lead to exacerbation of the patient's condition, hospital admission, and likely harm to the patient. Continued use is preferred.  Patient is able to protect their airways and clear secretions on their own.

## 2021-09-14 NOTE — Progress Notes (Signed)
Notified by nurse that patient was having periods of desaturation wearing oxygen set at 2lpm. Patient was placed on cpap via auto-mode.

## 2021-09-14 NOTE — Discharge Instructions (Signed)
Follow with Primary MD [name], MD in 7 days, please get referred for outpatient sleep study.  Get CBC, CMP, 2 view Chest X ray -  checked next visit within 1 week by Primary MD   Activity: As tolerated with Full fall precautions use walker/cane & assistance as needed  Disposition Home   Diet: Heart Healthy strict 1.2 L fluid restriction per day  Special Instructions: If you have smoked or chewed Tobacco  in the last 2 yrs please stop smoking, stop any regular Alcohol  and or any Recreational drug use.  On your next visit with your primary care physician please Get Medicines reviewed and adjusted.  Please request your Prim.MD to go over all Hospital Tests and Procedure/Radiological results at the follow up, please get all Hospital records sent to your Prim MD by signing hospital release before you go home.  If you experience worsening of your admission symptoms, develop shortness of breath, life threatening emergency, suicidal or homicidal thoughts you must seek medical attention immediately by calling 911 or calling your MD immediately  if symptoms less severe.  You Must read complete instructions/literature along with all the possible adverse reactions/side effects for all the Medicines you take and that have been prescribed to you. Take any new Medicines after you have completely understood and accpet all the possible adverse reactions/side effects.

## 2021-09-15 DIAGNOSIS — E871 Hypo-osmolality and hyponatremia: Secondary | ICD-10-CM | POA: Diagnosis not present

## 2021-09-15 LAB — COMPREHENSIVE METABOLIC PANEL
ALT: 22 U/L (ref 0–44)
AST: 23 U/L (ref 15–41)
Albumin: 2.7 g/dL — ABNORMAL LOW (ref 3.5–5.0)
Alkaline Phosphatase: 79 U/L (ref 38–126)
Anion gap: 10 (ref 5–15)
BUN: 19 mg/dL (ref 8–23)
CO2: 28 mmol/L (ref 22–32)
Calcium: 9.2 mg/dL (ref 8.9–10.3)
Chloride: 96 mmol/L — ABNORMAL LOW (ref 98–111)
Creatinine, Ser: 0.83 mg/dL (ref 0.44–1.00)
GFR, Estimated: >60 mL/min (ref >60)
Glucose, Bld: 111 mg/dL — ABNORMAL HIGH (ref 70–99)
Potassium: 3.7 mmol/L (ref 3.5–5.1)
Sodium: 134 mmol/L — ABNORMAL LOW (ref 135–145)
Total Bilirubin: 0.5 mg/dL (ref 0.3–1.2)
Total Protein: 6.1 g/dL — ABNORMAL LOW (ref 6.5–8.1)

## 2021-09-15 LAB — MAGNESIUM: Magnesium: 1.8 mg/dL (ref 1.7–2.4)

## 2021-09-15 NOTE — Progress Notes (Signed)
[facility]                                                                                                                                                                         Patient Demographics  Brooke Beasley, is a 88 y.o. female  EPP:[id number]  SAY:[id number]  DOB - 08/01/1930  Admit date - 09/06/2021  Admitting Physician Shela Leff, MD  Outpatient Primary MD for the patient is [name], MD  LOS - 8   Chief Complaint  Patient presents with   Hypertension        Assessment & Plan    Patient seen briefly today due for discharge soon per Discharge done yesterday by me, no further issues, Vital signs stable, patient feels fine.  We await approval of home BiPAP by the insurance company, patient is dependent on nighttime BiPAP to keep upper oxygen saturation, her pulse ox drops into mid 70s on 4 L nasal cannula at nighttime when she is sleeping without positive pressure ventilation.    Medications  Scheduled Meds:  acetaminophen  1,000 mg Oral Q8H   apixaban  5 mg Oral BID   brimonidine  1 drop Both Eyes Daily   chlorhexidine  15 mL Mouth Rinse BID   clonazePAM  0.5 mg Oral QHS   ferrous sulfate  325 mg Oral BID WC   furosemide  20 mg Oral Daily   isosorbide mononitrate  15 mg Oral QPM   latanoprost  1 drop Both Eyes QHS   levothyroxine  125 mcg Oral Daily   mouth rinse  15 mL Mouth Rinse q12n4p   pantoprazole  40 mg Oral BID   vancomycin  125 mg Oral BID   Continuous Infusions: PRN Meds:.atropine, bisacodyl, diclofenac Sodium, hydrALAZINE, ketorolac, ondansetron **OR** ondansetron (ZOFRAN) IV, prochlorperazine    Time Spent in minutes   10 minutes   Lala Lund M.D on 09/15/2021 at 9:10 AM  Between 7am to 7pm - Pager - [phone]  After 7pm go to [email or web address] - password [email or web address]  And look for the night coverage person covering for me after hours  [facility] Office  [phone]    Subjective:   Brooke Beasley today has, No headache, No chest pain, No abdominal pain - No Nausea, No new weakness tingling or numbness, No Cough - SOB.    Objective:   Vitals:   09/15/21 0339 09/15/21 0500 09/15/21 0700 09/15/21 0812  BP: 123/82   138/80  Pulse: 84   88  Resp: 20  (!) 1 16  Temp: 98.1 F (36.7 C)   98.3 F (36.8 C)  TempSrc: Axillary   Oral  SpO2: 97%   91%  Weight:  75 kg  Height:        Wt Readings from Last 3 Encounters:  09/15/21 75 kg  09/01/21 68.5 kg  08/19/21 69 kg     Intake/Output Summary (Last 24 hours) at 09/15/2021 0910 Last data filed at 09/14/2021 2016 Gross per 24 hour  Intake --  Output 400 ml  Net -400 ml    Exam Awake Alert, Oriented X 3, No new F.N deficits, Normal affect [place].AT,PERRAL Supple Neck,No JVD, No cervical lymphadenopathy appriciated.  Symmetrical Chest wall movement, Good air movement bilaterally, CTAB RRR,No Gallops,Rubs or new Murmurs, No Parasternal Heave +ve B.Sounds, Abd Soft, Non tender, No organomegaly appriciated, No rebound - guarding or rigidity. No Cyanosis, Clubbing or edema, No new Rash or bruise      Data Review

## 2021-09-15 NOTE — TOC Progression Note (Signed)
Transition of Care [facility] - Progression Note    Patient Details  Name: Brooke Beasley MRN: [id number] Date of Birth: 1930-07-06  Transition [facility]) CM/SW Contact  Carles Collet, RN Phone Number: 09/15/2021, 2:52 PM  Clinical Narrative:     Damaris Schooner w patient and daughter [name] at bedside to explain that we were still waiting for authorization or denial for BiPAP.   Discussed out of pocket cost of used BiPAP $880, and used CPAP $800 if they would want to process today and follow up with auth and reimbursement with [employer] after discharge.   They are agreeable however want to wait closer to 4:30 to see if something comes back from the insurance.  Updated Zach with [employer] so he could assess availability of DME for delivery to room today after 4:30, he will goto room to speak with patient and daughter to see if he can better assist and expedite process with [employer].     Expected Discharge Plan: [place] Barriers to Discharge: Equipment Delay  Expected Discharge Plan and Services Expected Discharge Plan: [place]   Discharge Planning Services: CM Consult Post Acute Care Choice: Durable Medical Equipment, Home Health Living arrangements for the past 2 months: Single Family Home Expected Discharge Date: 09/14/21               DME Arranged: NIV DME Agency: [facility] Date DME Agency Contacted: 09/14/21 Time DME Agency Contacted: 1000 Representative spoke with at DME Agency: Thedore Mins HH Arranged: RN, PT, OT [facility] Agency: [place] Date [place]: 09/14/21 Time [place]: [id number] Representative spoke with at [place]: [place] ([place]) Interventions    Readmission Risk Interventions    05/29/2021   11:32 AM  Readmission Risk Prevention Plan  Transportation Screening Complete  PCP or Specialist Appt within 3-5 Days Complete  HRI or Home Care Consult Complete  Palliative Care  Screening Not Applicable

## 2021-09-15 NOTE — Progress Notes (Signed)
Physical Therapy Treatment Patient Details Name: Brooke Beasley MRN: [id number] DOB: [birth year] Today's Date: 09/15/2021   History of Present Illness 88 year old white female presents to the ER 09/07/21 with several days of nausea, vomiting, weakness. +hyponatremia,  PMH atrial fibrillation, discitis after a perirectal abscess, hypertension, CKD stage IIIa, mitral regurg, hypothyroidism.    PT Comments    Pt received in recliner, daughter present during session, pt agreeable to therapy session with emphasis on LE exercise instruction and gait/transfer training. Pt with improved standing tolerance and good effort with Le exercises, able to stand x10 reps in a row from chair with arms crossed and VSS on RA. Pt able to perform short distance gait trial in room with RW but fatigued after performing exercises/mobility to and front bathroom and deferred longer gait trial in hallway. Per pt family member, she had walked out to RN station and around hallway earlier in day. Pt continues to benefit from PT services to progress toward functional mobility goals.    Recommendations for follow up therapy are one component of a multi-disciplinary discharge planning process, led by the attending physician.  Recommendations may be updated based on patient status, additional functional criteria and insurance authorization.  Follow Up Recommendations  Home health PT     Assistance Recommended at Discharge Intermittent Supervision/Assistance  Patient can return home with the following A little help with walking and/or transfers;A little help with bathing/dressing/bathroom;Assistance with cooking/housework;Help with stairs or ramp for entrance   Equipment Recommendations  None recommended by PT    Recommendations for Other Services       Precautions / Restrictions Precautions Precautions: Fall Precaution Comments: watch HR Restrictions Weight Bearing Restrictions: No     Mobility  Bed Mobility                General bed mobility comments: pt receieved/remained in chair    Transfers Overall transfer level: Needs assistance Equipment used: Rolling walker (2 wheels), None Transfers: Sit to/from Stand Sit to Stand: Supervision, Min guard           General transfer comment: x10 reps with arms crossed from chair, min guard for safety; Supervision when using arms for support    Ambulation/Gait Ambulation/Gait assistance: Min guard Gait Distance (Feet): 18 Feet (x2 (to/from bathroom)) Assistive device: Rolling walker (2 wheels) Gait Pattern/deviations: Step-through pattern, Decreased step length - right, Decreased step length - left, Shuffle, Trunk flexed Gait velocity: slow     General Gait Details: fair pace, cues for proximity to RW and upright posture, HR WFL up to 103 bpm with exertion, SpO2 WFL on RA.   Stairs             Wheelchair Mobility    Modified Rankin (Stroke Patients Only)       Balance Overall balance assessment: Needs assistance Sitting-balance support: No upper extremity supported, Feet unsupported Sitting balance-Leahy Scale: Fair     Standing balance support: During functional activity Standing balance-Leahy Scale: Fair                              Cognition Arousal/Alertness: Awake/alert Behavior During Therapy: Flat affect Overall Cognitive Status: Within Functional Limits for tasks assessed                                 General Comments: looking forward to getting home, participatory with encouragement  Exercises General Exercises - Lower Extremity Ankle Circles/Pumps: AROM, Both, 10 reps, Seated Quad Sets: AROM, Both, 10 reps, Supine Gluteal Sets: AROM, 5 reps, Supine Short Arc Quad: AROM, Both, 10 reps, Supine Heel Slides: AROM, Both, 10 reps, Supine Hip ABduction/ADduction: AROM, Both, 10 reps, Supine Other Exercises Other Exercises: STS x 10 reps reciprocal with arms crossed at  chest    General Comments General comments (skin integrity, edema, etc.): see gait section, no dizziness reported with transfers or gait      Pertinent Vitals/Pain Pain Assessment Pain Assessment: No/denies pain Pain Intervention(s): Monitored during session    Home Living                          Prior Function            PT Goals (current goals can now be found in the care plan section) Acute Rehab PT Goals Patient Stated Goal: to feel better PT Goal Formulation: With patient Time For Goal Achievement: 09/21/21 Progress towards PT goals: Progressing toward goals    Frequency    Min 3X/week      PT Plan Current plan remains appropriate    Co-evaluation              AM-PAC PT "6 Clicks" Mobility   Outcome Measure  Help needed turning from your back to your side while in a flat bed without using bedrails?: None Help needed moving from lying on your back to sitting on the side of a flat bed without using bedrails?: A Little Help needed moving to and from a bed to a chair (including a wheelchair)?: A Little Help needed standing up from a chair using your arms (e.g., wheelchair or bedside chair)?: A Little Help needed to walk in hospital room?: A Little Help needed climbing 3-5 steps with a railing? : A Lot 6 Click Score: 18    End of Session   Activity Tolerance: Patient tolerated treatment well Patient left: in chair;with call bell/phone within reach;with family/visitor present Nurse Communication: Mobility status PT Visit Diagnosis: Difficulty in walking, not elsewhere classified (R26.2);Muscle weakness (generalized) (M62.81)     Time: 9833-8250 PT Time Calculation (min) (ACUTE ONLY): 23 min  Charges:  $Gait Training: 8-22 mins $Therapeutic Exercise: 8-22 mins                     Thaer Miyoshi P., PTA Acute Rehabilitation Services Secure Chat Preferred 9a-5:30pm Office: [place] 09/15/2021, 6:07 PM

## 2021-09-15 NOTE — Progress Notes (Signed)
Patient placed on CPAP at this time.  

## 2021-09-15 NOTE — TOC Progression Note (Signed)
Transition of Care [facility]) - Progression Note    Patient Details  Name: Brooke Beasley MRN: [id number] Date of Birth: [birth year]  Transition of Care [facility]) CM/SW Contact  Carles Collet, RN Phone Number: 09/15/2021, 7:55 AM  Clinical Narrative:   Scanned signed or for BiPAP/ NIV to [place] with [employer].     Expected Discharge Plan: [place] Barriers to Discharge: Equipment Delay  Expected Discharge Plan and Services Expected Discharge Plan: [place]   Discharge Planning Services: CM Consult Post Acute Care Choice: Durable Medical Equipment, Home Health Living arrangements for the past 2 months: Single Family Home Expected Discharge Date: 09/14/21               DME Arranged: NIV DME Agency: [employer] Date DME Agency Contacted: 09/14/21 Time DME Agency Contacted: 1000 Representative spoke with at DME Agency: Thedore Mins HH Arranged: RN, PT, OT [facility] Agency: [place] Date [place]: 09/14/21 Time [place]: 1771 Representative spoke with at [place]: [place] ([place]) Interventions    Readmission Risk Interventions    05/29/2021   11:32 AM  Readmission Risk Prevention Plan  Transportation Screening Complete  PCP or Specialist Appt within 3-5 Days Complete  HRI or Home Care Consult Complete  Palliative Care Screening Not Applicable

## 2021-09-16 ENCOUNTER — Encounter: Payer: Self-pay | Admitting: Cardiology

## 2021-09-16 LAB — COMPREHENSIVE METABOLIC PANEL
ALT: 18 U/L (ref 0–44)
AST: 22 U/L (ref 15–41)
Albumin: 2.6 g/dL — ABNORMAL LOW (ref 3.5–5.0)
Alkaline Phosphatase: 85 U/L (ref 38–126)
Anion gap: 6 (ref 5–15)
BUN: 16 mg/dL (ref 8–23)
CO2: 28 mmol/L (ref 22–32)
Calcium: 9.2 mg/dL (ref 8.9–10.3)
Chloride: 100 mmol/L (ref 98–111)
Creatinine, Ser: 0.82 mg/dL (ref 0.44–1.00)
GFR, Estimated: >60 mL/min (ref >60)
Glucose, Bld: 119 mg/dL — ABNORMAL HIGH (ref 70–99)
Potassium: 3.7 mmol/L (ref 3.5–5.1)
Sodium: 134 mmol/L — ABNORMAL LOW (ref 135–145)
Total Bilirubin: 0.2 mg/dL — ABNORMAL LOW (ref 0.3–1.2)
Total Protein: 5.9 g/dL — ABNORMAL LOW (ref 6.5–8.1)

## 2021-09-16 LAB — BASIC METABOLIC PANEL
Anion gap: 11 (ref 5–15)
BUN: 12 mg/dL (ref 8–23)
CO2: 23 mmol/L (ref 22–32)
Calcium: 9 mg/dL (ref 8.9–10.3)
Chloride: 79 mmol/L — ABNORMAL LOW (ref 98–111)
Creatinine, Ser: 0.72 mg/dL (ref 0.44–1.00)
GFR, Estimated: >60 mL/min (ref >60)
Glucose, Bld: 124 mg/dL — ABNORMAL HIGH (ref 70–99)
Potassium: 3.5 mmol/L (ref 3.5–5.1)
Sodium: 113 mmol/L — CL (ref 135–145)

## 2021-09-16 LAB — MAGNESIUM: Magnesium: 1.8 mg/dL (ref 1.7–2.4)

## 2021-09-16 MED ORDER — CARVEDILOL 6.25 MG PO TABS
6.2500 mg | ORAL_TABLET | Freq: Two times a day (BID) | ORAL | Status: DC
  Administered 2021-09-16 – 2021-09-17 (×3): 6.25 mg via ORAL
  Filled 2021-09-16 (×3): qty 1

## 2021-09-16 MED ORDER — CARVEDILOL 6.25 MG PO TABS: 6.2500 mg | ORAL_TABLET | Freq: Two times a day (BID) | ORAL | 0 refills | Status: DC

## 2021-09-16 MED ORDER — HYDRALAZINE HCL 25 MG PO TABS: 25.0000 mg | ORAL_TABLET | Freq: Three times a day (TID) | ORAL | 0 refills | Status: DC

## 2021-09-16 MED ORDER — HYDRALAZINE HCL 25 MG PO TABS
25.0000 mg | ORAL_TABLET | Freq: Three times a day (TID) | ORAL | Status: DC
  Administered 2021-09-16 – 2021-09-17 (×4): 25 mg via ORAL
  Filled 2021-09-16 (×4): qty 1

## 2021-09-16 NOTE — Telephone Encounter (Signed)
I do not see a sooner appt .  Will add to wait list

## 2021-09-16 NOTE — Progress Notes (Signed)
   09/15/21 2058  BiPAP/CPAP/SIPAP  BiPAP/CPAP/SIPAP Pt Type Adult  Mask Type Full face mask  Mask Size Small  Respiratory Rate 23 breaths/min  EPAP  (max 20, min 6)  Flow Rate 2 lpm  BiPAP/CPAP/SIPAP CPAP  Patient Home Equipment No  Auto Titrate Yes  BiPAP/CPAP /SiPAP Vitals  Pulse Rate 84  Resp (!) 23  SpO2 95 %  Bilateral Breath Sounds Diminished  MEWS Score/Color  MEWS Score 1  MEWS Score Color Green

## 2021-09-16 NOTE — Progress Notes (Signed)
Patients home BiPAP set up and placed on patient at this time.

## 2021-09-16 NOTE — TOC Transition Note (Signed)
Transition of Care [facility]) - CM/SW Discharge Note   Patient Details  Name: Brooke Beasley MRN: [id number] Date of Birth: 18-Jan-1931  [facility]) CM/SW Contact:  Carles Collet, RN Phone Number: 09/16/2021, 4:26 PM   Clinical Narrative:    BiPAP, private pay, will be delivered to the room tonight, after which patient may DC. MD aware.  [facility] has been notified of DC. Daughter to transport home    Final next level of care: [place] Barriers to Discharge: No Barriers Identified   Patient Goals and CMS Choice Patient states their goals for this hospitalization and ongoing recovery are:: to go home CMS Medicare.gov Compare Post Acute Care list provided to:: Patient Choice offered to / list presented to : Patient, Adult Children  Discharge Placement                       Discharge Plan and Services   Discharge Planning Services: CM Consult Post Acute Care Choice: Durable Medical Equipment, Home Health          DME Arranged: NIV DME Agency: [employer] Date DME Agency Contacted: 09/14/21 Time DME Agency Contacted: 1000 Representative spoke with at DME Agency: Thedore Mins HH Arranged: RN, PT, OT [facility] Agency: [place] Date [place]: 09/16/21 Time [place]: 1626 Representative spoke with at [place]: [place]) Interventions     Readmission Risk Interventions    05/29/2021   11:32 AM  Readmission Risk Prevention Plan  Transportation Screening Complete  PCP or Specialist Appt within 3-5 Days Complete  HRI or Home Care Consult Complete  Palliative Care Screening Not Applicable

## 2021-09-16 NOTE — Care Management Important Message (Signed)
Important Message  Patient Details  Name: Brooke Beasley MRN: [id number] Date of Birth: 1930/05/04   Medicare Important Message Given:  Yes  Due to Droplet Precaution Order in place IM will be mailed to the patient home address.     Ewelina Naves 09/16/2021, 4:35 PM

## 2021-09-16 NOTE — Care Management Important Message (Signed)
Important Message  Patient Details  Name: Brooke Beasley MRN: [id number] Date of Birth: [birth year]   Medicare Important Message Given:  Yes     [name] 09/16/2021, 4:16 PM

## 2021-09-16 NOTE — Progress Notes (Signed)
Physical Therapy Treatment Patient Details Name: Brooke Beasley MRN: [id number] DOB: February 16, 1931 Today's Date: 09/16/2021   History of Present Illness 88 year old white female presents to the ER 09/07/21 with several days of nausea, vomiting, weakness. +hyponatremia. Mild sx orthostatic hypotension 5/24 PT tx.  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    PT Comments    Pt received in supine, c/o anxiety due to elevated BP and recently urinating more from lasix (less discomfort than prior to taking it) but agreeable to therapy session with goal of OOB transfer training and gait. Pt gait distance limited due to mild symptoms of orthostatic hypotension and fatigue, but pt agreeable to OOB to chair transfer and lunch arriving at end of session. Pt reports improved comfort and decreased anxiety at end of session, RN notified pt BP drop from BP 172/97 (120) supine prior to OOB and BP 141/95 (111) standing at bedside with mild lightheadedness. BP 148/88 (106) once seated in chair. Pt tolerated standing ~10 mins and needing up to min guard for bed mobility, standing and pivotal transfers to chair. Pt continues to benefit from PT services to progress toward functional mobility goals.   Recommendations for follow up therapy are one component of a multi-disciplinary discharge planning process, led by the attending physician.  Recommendations may be updated based on patient status, additional functional criteria and insurance authorization.  Follow Up Recommendations  Home health PT     Assistance Recommended at Discharge Intermittent Supervision/Assistance  Patient can return home with the following A little help with walking and/or transfers;A little help with bathing/dressing/bathroom;Assistance with cooking/housework;Help with stairs or ramp for entrance   Equipment Recommendations  None recommended by PT     Recommendations for Other Services       Precautions / Restrictions Precautions Precautions: Fall Precaution Comments: watch HR/BP, orthostatic Restrictions Weight Bearing Restrictions: No     Mobility  Bed Mobility Overal bed mobility: Needs Assistance Bed Mobility: Supine to Sit     Supine to sit: Min guard     General bed mobility comments: heavy reliance on bed rail to raise trunk, min guard to stabilize trunk upon reaching upright posture.    Transfers Overall transfer level: Needs assistance Equipment used: Rolling walker (2 wheels), None Transfers: Sit to/from Stand Sit to Stand: Min guard   Step pivot transfers: Min guard       General transfer comment: from EOB>recliner ~57f, deferred further distance due to pt c/o fatigue and mild sx orthostatic hypotension, pt food arriving for lunch pt requesting to eat.    Ambulation/Gait             Pre-gait activities: see above, pivotal steps to chair with min guard and RW, distance limited due to + sx orthostatic hypotension and pt requesting to eat lunch        Balance Overall balance assessment: Needs assistance Sitting-balance support: No upper extremity supported, Feet unsupported Sitting balance-Leahy Scale: Fair     Standing balance support: During functional activity Standing balance-Leahy Scale: Fair Standing balance comment: pt reliant on RW today for support, able to perform her own peri-care standing with U UE support of RW and min guard for stability                            Cognition Arousal/Alertness: Awake/alert Behavior During Therapy: Flat affect Overall Cognitive Status: Within Functional Limits for tasks assessed  General Comments: looking forward to getting home, participatory with encouragement, anxious prior to OOB but reports feeling better once in chair.        Exercises General Exercises - Lower Extremity Ankle  Circles/Pumps: AROM, Both, 10 reps, Supine Other Exercises Other Exercises: IS x 5 reps (1000-1250 mL) Other Exercises: Static standing and weight shifting in stance x 8 mins for BLE strengthening during peri-care    General Comments General comments (skin integrity, edema, etc.): HR WFL, SpO2 WFL on 2L O2 [place], orthostatic hypotension upon standing with mild symptoms      Pertinent Vitals/Pain Pain Assessment Pain Assessment: No/denies pain Pain Intervention(s): Monitored during session, Repositioned     PT Goals (current goals can now be found in the care plan section) Acute Rehab PT Goals Patient Stated Goal: to feel better PT Goal Formulation: With patient Time For Goal Achievement: 09/21/21 Progress towards PT goals: Progressing toward goals    Frequency    Min 3X/week      PT Plan Current plan remains appropriate       AM-PAC PT "6 Clicks" Mobility   Outcome Measure  Help needed turning from your back to your side while in a flat bed without using bedrails?: None Help needed moving from lying on your back to sitting on the side of a flat bed without using bedrails?: A Little Help needed moving to and from a bed to a chair (including a wheelchair)?: A Little Help needed standing up from a chair using your arms (e.g., wheelchair or bedside chair)?: A Little Help needed to walk in hospital room?: A Lot (due to lightheadedness today) Help needed climbing 3-5 steps with a railing? : A Lot 6 Click Score: 17    End of Session Equipment Utilized During Treatment: Oxygen Activity Tolerance: Patient tolerated treatment well;Treatment limited secondary to medical complications (Comment) (mild symptoms of orthostatic hypotension) Patient left: in chair;with call bell/phone within reach;with family/visitor present (friend Fraser Din present, will notify staff if she leaves for placement of chair alarm (pad in room)) Nurse Communication: Mobility status;Other (comment) (sx  orthostasis, BP) PT Visit Diagnosis: Difficulty in walking, not elsewhere classified (R26.2);Muscle weakness (generalized) (M62.81)     Time: 7628-3151 PT Time Calculation (min) (ACUTE ONLY): 31 min  Charges:  $Therapeutic Exercise: 8-22 mins $Therapeutic Activity: 8-22 mins                     Caidyn Henricksen P., PTA Acute Rehabilitation Services Secure Chat Preferred 9a-5:30pm Office: [place] 09/16/2021, 12:15 PM

## 2021-09-17 ENCOUNTER — Other Ambulatory Visit: Payer: Self-pay | Admitting: Family Medicine

## 2021-09-17 DIAGNOSIS — E871 Hypo-osmolality and hyponatremia: Secondary | ICD-10-CM | POA: Diagnosis not present

## 2021-09-17 LAB — BASIC METABOLIC PANEL
Anion gap: 9 (ref 5–15)
BUN: 17 mg/dL (ref 8–23)
CO2: 31 mmol/L (ref 22–32)
Calcium: 9.6 mg/dL (ref 8.9–10.3)
Chloride: 97 mmol/L — ABNORMAL LOW (ref 98–111)
Creatinine, Ser: 0.89 mg/dL (ref 0.44–1.00)
GFR, Estimated: >60 mL/min (ref >60)
Glucose, Bld: 114 mg/dL — ABNORMAL HIGH (ref 70–99)
Potassium: 3.9 mmol/L (ref 3.5–5.1)
Sodium: 137 mmol/L (ref 135–145)

## 2021-09-17 NOTE — Progress Notes (Signed)
SATURATION QUALIFICATIONS: (This note is used to comply with regulatory documentation for home oxygen)  Patient Saturations on Room Air at Rest = 98%  Patient Saturations on Room Air while Ambulating = 88%  Patient Saturations on 1 Liters of oxygen while Ambulating = 95%  Please briefly explain why patient needs home oxygen:

## 2021-09-17 NOTE — Progress Notes (Signed)
[facility]                                                                                                                                                                         Patient Demographics  Brooke Beasley, is a 88 y.o. female  HUT:[id number]  SFK:[id number]  DOB - October 21, 1930  Admit date - 09/06/2021  Admitting Physician Brooke Leff, MD  Outpatient Primary MD for the patient is Brooke Bush, MD  LOS - 44   Chief Complaint  Patient presents with   Hypertension        Assessment & Plan    Patient seen briefly today due for discharge soon per Discharge done yesterday by me, no further issues, Vital signs stable, patient feels fine.  BiPAP device finally delivered to the hospital, home oxygen ordered.  Family to take patient home today.   Medications  Scheduled Meds:  acetaminophen  1,000 mg Oral Q8H   apixaban  5 mg Oral BID   brimonidine  1 drop Both Eyes Daily   carvedilol  6.25 mg Oral BID WC   chlorhexidine  15 mL Mouth Rinse BID   clonazePAM  0.5 mg Oral QHS   ferrous sulfate  325 mg Oral BID WC   furosemide  20 mg Oral Daily   hydrALAZINE  25 mg Oral Q8H   isosorbide mononitrate  15 mg Oral QPM   latanoprost  1 drop Both Eyes QHS   levothyroxine  125 mcg Oral Daily   mouth rinse  15 mL Mouth Rinse q12n4p   pantoprazole  40 mg Oral BID   vancomycin  125 mg Oral BID   Continuous Infusions: PRN Meds:.atropine, bisacodyl, diclofenac Sodium, hydrALAZINE, ondansetron **OR** ondansetron (ZOFRAN) IV, prochlorperazine    Time Spent in minutes   10 minutes   Brooke Beasley M.D on 09/17/2021 at 9:11 AM  Between 7am to 7pm - Pager - [phone]  After 7pm go to [email or web address] - password [email or web address]  And look for the night coverage person covering for me after hours  [facility] Office  [phone]    Subjective:   Brooke Beasley today has, No headache, No  chest pain, No abdominal pain - No Nausea, No new weakness tingling or numbness, No Cough - SOB.   Objective:   Vitals:   09/17/21 0335 09/17/21 0624 09/17/21 0752 09/17/21 0834  BP:  (!) 196/90  135/77  Pulse: 83  90 89  Resp: (!) '23  18 16  '$ Temp: (!) 97 F (36.1 C)   97.8 F (36.6 C)  TempSrc: Axillary   Oral  SpO2: 97%  93% 95%  Weight:      Height:        Wt Readings from  Last 3 Encounters:  09/15/21 75 kg  09/01/21 68.5 kg  08/19/21 69 kg     Intake/Output Summary (Last 24 hours) at 09/17/2021 0911 Last data filed at 09/17/2021 0600 Gross per 24 hour  Intake --  Output 800 ml  Net -800 ml    Exam Awake Alert, Oriented X 3, No new F.N deficits, Normal affect [place].AT,PERRAL Supple Neck,No JVD, No cervical lymphadenopathy appriciated.  Symmetrical Chest wall movement, Good air movement bilaterally, CTAB RRR,No Gallops,Rubs or new Murmurs, No Parasternal Heave +ve B.Sounds, Abd Soft, Non tender, No organomegaly appriciated, No rebound - guarding or rigidity. No Cyanosis, Clubbing or edema, No new Rash or bruise      Data Review

## 2021-09-17 NOTE — Progress Notes (Signed)
Discharge paperwork reviewed with client at this time. No complaints of pain have been made at his time. Ivs have been removed. Patients daughter is by the bedside and ready to transfer patient home. Personal belongings returned to patient at this time.

## 2021-09-17 NOTE — TOC Transition Note (Addendum)
Transition of Care [facility]) - CM/SW Discharge Note   Patient Details  Name: Brooke Beasley MRN: [id number] Date of Birth: 07-Jan-1931  [facility]) CM/SW Contact:  Carles Collet, RN Phone Number: 09/17/2021, 9:31 AM   Clinical Narrative:   Oxygen not delivered last with BiPAP, DME company was aware it was part of order, resulting in patient staying extra night for out of pocket DME (insurance auth still pending, patient deciding to pay out of pocket to get home sooner)  Spoke with DME company first thing this morning, and oxygen rectified, order placed ambulatory sats obtained so oxygen will covered through her insurance. Oxygen will be delivered to room this morning, nurse aware. Alfredo Bach with [employer] will ensure that they understand how to bleed in oxygen and confirms that they are fully set up with BiPAP and oxygen for discharge. He will be calling the daughter, [name] shortly.      Final next level of care: [place] Barriers to Discharge: No Barriers Identified   Patient Goals and CMS Choice Patient states their goals for this hospitalization and ongoing recovery are:: to go home CMS Medicare.gov Compare Post Acute Care list provided to:: Patient Choice offered to / list presented to : Patient, Adult Children  Discharge Placement                       Discharge Plan and Services   Discharge Planning Services: CM Consult Post Acute Care Choice: Durable Medical Equipment, Home Health          DME Arranged: NIV DME Agency: [employer] Date DME Agency Contacted: 09/14/21 Time DME Agency Contacted: 1000 Representative spoke with at DME Agency: Thedore Mins HH Arranged: RN, PT, OT [facility] Agency: [place] Date [place]: 09/16/21 Time [place]: 1626 Representative spoke with at [place]: [place] ([place]) Interventions     Readmission Risk Interventions    05/29/2021   11:32 AM   Readmission Risk Prevention Plan  Transportation Screening Complete  PCP or Specialist Appt within 3-5 Days Complete  HRI or Home Care Consult Complete  Palliative Care Screening Not Applicable

## 2021-09-18 NOTE — Telephone Encounter (Signed)
Name of Medication: Clonazepam Name of Pharmacy: [facility] or Written Date and Quantity: 08/07/21, #30 Last Office Visit and Type: 08/19/21, hosp f/u Next Office Visit and Type: 10/09/21, 6 mo f/u Last Controlled Substance Agreement Date: 09/26/14 Last UDS: 09/26/14

## 2021-09-19 DIAGNOSIS — E785 Hyperlipidemia, unspecified: Secondary | ICD-10-CM | POA: Diagnosis not present

## 2021-09-19 DIAGNOSIS — H409 Unspecified glaucoma: Secondary | ICD-10-CM | POA: Diagnosis not present

## 2021-09-19 DIAGNOSIS — B964 Proteus (mirabilis) (morganii) as the cause of diseases classified elsewhere: Secondary | ICD-10-CM | POA: Diagnosis not present

## 2021-09-19 DIAGNOSIS — M5114 Intervertebral disc disorders with radiculopathy, thoracic region: Secondary | ICD-10-CM | POA: Diagnosis not present

## 2021-09-19 DIAGNOSIS — M4627 Osteomyelitis of vertebra, lumbosacral region: Secondary | ICD-10-CM | POA: Diagnosis not present

## 2021-09-19 DIAGNOSIS — M4807 Spinal stenosis, lumbosacral region: Secondary | ICD-10-CM | POA: Diagnosis not present

## 2021-09-19 DIAGNOSIS — M47817 Spondylosis without myelopathy or radiculopathy, lumbosacral region: Secondary | ICD-10-CM | POA: Diagnosis not present

## 2021-09-19 DIAGNOSIS — K5790 Diverticulosis of intestine, part unspecified, without perforation or abscess without bleeding: Secondary | ICD-10-CM | POA: Diagnosis not present

## 2021-09-19 DIAGNOSIS — M4316 Spondylolisthesis, lumbar region: Secondary | ICD-10-CM | POA: Diagnosis not present

## 2021-09-19 DIAGNOSIS — E039 Hypothyroidism, unspecified: Secondary | ICD-10-CM | POA: Diagnosis not present

## 2021-09-19 DIAGNOSIS — K589 Irritable bowel syndrome without diarrhea: Secondary | ICD-10-CM | POA: Diagnosis not present

## 2021-09-19 DIAGNOSIS — F419 Anxiety disorder, unspecified: Secondary | ICD-10-CM | POA: Diagnosis not present

## 2021-09-19 DIAGNOSIS — N1831 Chronic kidney disease, stage 3a: Secondary | ICD-10-CM | POA: Diagnosis not present

## 2021-09-19 DIAGNOSIS — K219 Gastro-esophageal reflux disease without esophagitis: Secondary | ICD-10-CM | POA: Diagnosis not present

## 2021-09-19 DIAGNOSIS — M4647 Discitis, unspecified, lumbosacral region: Secondary | ICD-10-CM | POA: Diagnosis not present

## 2021-09-19 DIAGNOSIS — B952 Enterococcus as the cause of diseases classified elsewhere: Secondary | ICD-10-CM | POA: Diagnosis not present

## 2021-09-19 DIAGNOSIS — F32A Depression, unspecified: Secondary | ICD-10-CM | POA: Diagnosis not present

## 2021-09-19 DIAGNOSIS — I4891 Unspecified atrial fibrillation: Secondary | ICD-10-CM | POA: Diagnosis not present

## 2021-09-19 DIAGNOSIS — I350 Nonrheumatic aortic (valve) stenosis: Secondary | ICD-10-CM | POA: Diagnosis not present

## 2021-09-19 DIAGNOSIS — K449 Diaphragmatic hernia without obstruction or gangrene: Secondary | ICD-10-CM | POA: Diagnosis not present

## 2021-09-19 DIAGNOSIS — M5116 Intervertebral disc disorders with radiculopathy, lumbar region: Secondary | ICD-10-CM | POA: Diagnosis not present

## 2021-09-19 DIAGNOSIS — M810 Age-related osteoporosis without current pathological fracture: Secondary | ICD-10-CM | POA: Diagnosis not present

## 2021-09-19 DIAGNOSIS — M5117 Intervertebral disc disorders with radiculopathy, lumbosacral region: Secondary | ICD-10-CM | POA: Diagnosis not present

## 2021-09-19 DIAGNOSIS — I129 Hypertensive chronic kidney disease with stage 1 through stage 4 chronic kidney disease, or unspecified chronic kidney disease: Secondary | ICD-10-CM | POA: Diagnosis not present

## 2021-09-22 ENCOUNTER — Telehealth: Payer: Self-pay

## 2021-09-22 NOTE — Telephone Encounter (Signed)
ERx 

## 2021-09-22 NOTE — Telephone Encounter (Signed)
Transition Care Management Follow-up Telephone Call Date of discharge and from where: [place] 09-17-21 [id number] How have you been since you were released from the hospital? Still weak but doing the best I can  Any questions or concerns? Yes- has started itching   Items Reviewed: Did the pt receive and understand the discharge instructions provided? Yes  Medications obtained and verified? Yes  Other? No  Any new allergies since your discharge? No  Dietary orders reviewed? Yes Do you have support at home? Yes   Home Care and Equipment/Supplies: Were home health services ordered? Yes PT/OT/RN  If so, what is the name of the agency? [facility]   Has the agency set up a time to come to the patient's home? yes Were any new equipment or medical supplies ordered?  Yes: rolling walker  What is the name of the medical supply agency? Hospital  Were you able to get the supplies/equipment? yes Do you have any questions related to the use of the equipment or supplies? No  Functional Questionnaire: (I = Independent and D = Dependent) ADLs: D  Bathing/Dressing- D  Meal Prep- D  Eating- I  Maintaining continence- I  Transferring/Ambulation- I- WALKER   Managing Meds- D  Follow up appointments reviewed:  PCP Hospital f/u appt confirmed? Yes  Scheduled to see Dr Danise Mina on 09-25-21 at  @ [facility] f/u appt confirmed? Yes  Scheduled to see Dr Garen Lah on 10-05-21 @ 220pm. Are transportation arrangements needed? No  If their condition worsens, is the pt aware to call PCP or go to the Emergency Dept.? Yes Was the patient provided with contact information for the PCP's office or ED? Yes Was to pt encouraged to call back with questions or concerns? Yes

## 2021-09-23 ENCOUNTER — Telehealth: Payer: Self-pay

## 2021-09-23 ENCOUNTER — Inpatient Hospital Stay: Payer: PPO | Admitting: Family Medicine

## 2021-09-23 NOTE — Telephone Encounter (Signed)
Home Health verbal orders  Agency Name: [facility]  Requesting PT  Frequency: 2 times a week for 2 weeks  COMMENTS: Okay to leave a VM if no answer.   Please forward to [facility] pool or providers CMA

## 2021-09-23 NOTE — Telephone Encounter (Signed)
Agree with this. Thanks.  

## 2021-09-24 NOTE — Telephone Encounter (Signed)
Spoke with Amy informing her Dr. Darnell Level is giving verbal orders for services requested.

## 2021-09-25 ENCOUNTER — Ambulatory Visit (INDEPENDENT_AMBULATORY_CARE_PROVIDER_SITE_OTHER): Payer: PPO | Admitting: Family Medicine

## 2021-09-25 ENCOUNTER — Ambulatory Visit (INDEPENDENT_AMBULATORY_CARE_PROVIDER_SITE_OTHER)
Admission: RE | Admit: 2021-09-25 | Discharge: 2021-09-25 | Disposition: A | Discharge status: Home / Self Care | Payer: PPO | Source: Ambulatory Visit | Attending: Family Medicine | Admitting: Family Medicine

## 2021-09-25 ENCOUNTER — Encounter: Payer: Self-pay | Admitting: Family Medicine

## 2021-09-25 VITALS — BP 136/82 | HR 89 | Temp 97.7°F | Ht 61.0 in | Wt 150.2 lb

## 2021-09-25 DIAGNOSIS — E785 Hyperlipidemia, unspecified: Secondary | ICD-10-CM | POA: Diagnosis not present

## 2021-09-25 DIAGNOSIS — M5114 Intervertebral disc disorders with radiculopathy, thoracic region: Secondary | ICD-10-CM | POA: Diagnosis not present

## 2021-09-25 DIAGNOSIS — M5116 Intervertebral disc disorders with radiculopathy, lumbar region: Secondary | ICD-10-CM | POA: Diagnosis not present

## 2021-09-25 DIAGNOSIS — E871 Hypo-osmolality and hyponatremia: Secondary | ICD-10-CM

## 2021-09-25 DIAGNOSIS — I10 Essential (primary) hypertension: Secondary | ICD-10-CM

## 2021-09-25 DIAGNOSIS — I350 Nonrheumatic aortic (valve) stenosis: Secondary | ICD-10-CM | POA: Diagnosis not present

## 2021-09-25 DIAGNOSIS — M5117 Intervertebral disc disorders with radiculopathy, lumbosacral region: Secondary | ICD-10-CM | POA: Diagnosis not present

## 2021-09-25 DIAGNOSIS — M4316 Spondylolisthesis, lumbar region: Secondary | ICD-10-CM | POA: Diagnosis not present

## 2021-09-25 DIAGNOSIS — K219 Gastro-esophageal reflux disease without esophagitis: Secondary | ICD-10-CM | POA: Diagnosis not present

## 2021-09-25 DIAGNOSIS — J9691 Respiratory failure, unspecified with hypoxia: Secondary | ICD-10-CM

## 2021-09-25 DIAGNOSIS — F418 Other specified anxiety disorders: Secondary | ICD-10-CM | POA: Diagnosis not present

## 2021-09-25 DIAGNOSIS — I4819 Other persistent atrial fibrillation: Secondary | ICD-10-CM | POA: Diagnosis not present

## 2021-09-25 DIAGNOSIS — K5790 Diverticulosis of intestine, part unspecified, without perforation or abscess without bleeding: Secondary | ICD-10-CM | POA: Diagnosis not present

## 2021-09-25 DIAGNOSIS — L299 Pruritus, unspecified: Secondary | ICD-10-CM

## 2021-09-25 DIAGNOSIS — M4647 Discitis, unspecified, lumbosacral region: Secondary | ICD-10-CM | POA: Diagnosis not present

## 2021-09-25 DIAGNOSIS — I129 Hypertensive chronic kidney disease with stage 1 through stage 4 chronic kidney disease, or unspecified chronic kidney disease: Secondary | ICD-10-CM | POA: Diagnosis not present

## 2021-09-25 DIAGNOSIS — B952 Enterococcus as the cause of diseases classified elsewhere: Secondary | ICD-10-CM | POA: Diagnosis not present

## 2021-09-25 DIAGNOSIS — M810 Age-related osteoporosis without current pathological fracture: Secondary | ICD-10-CM | POA: Diagnosis not present

## 2021-09-25 DIAGNOSIS — F419 Anxiety disorder, unspecified: Secondary | ICD-10-CM | POA: Diagnosis not present

## 2021-09-25 DIAGNOSIS — K589 Irritable bowel syndrome without diarrhea: Secondary | ICD-10-CM | POA: Diagnosis not present

## 2021-09-25 DIAGNOSIS — I4891 Unspecified atrial fibrillation: Secondary | ICD-10-CM | POA: Diagnosis not present

## 2021-09-25 DIAGNOSIS — E039 Hypothyroidism, unspecified: Secondary | ICD-10-CM | POA: Diagnosis not present

## 2021-09-25 DIAGNOSIS — M47817 Spondylosis without myelopathy or radiculopathy, lumbosacral region: Secondary | ICD-10-CM | POA: Diagnosis not present

## 2021-09-25 DIAGNOSIS — H409 Unspecified glaucoma: Secondary | ICD-10-CM | POA: Diagnosis not present

## 2021-09-25 DIAGNOSIS — F32A Depression, unspecified: Secondary | ICD-10-CM | POA: Diagnosis not present

## 2021-09-25 DIAGNOSIS — K449 Diaphragmatic hernia without obstruction or gangrene: Secondary | ICD-10-CM | POA: Diagnosis not present

## 2021-09-25 DIAGNOSIS — M4807 Spinal stenosis, lumbosacral region: Secondary | ICD-10-CM | POA: Diagnosis not present

## 2021-09-25 DIAGNOSIS — N1831 Chronic kidney disease, stage 3a: Secondary | ICD-10-CM | POA: Diagnosis not present

## 2021-09-25 DIAGNOSIS — B964 Proteus (mirabilis) (morganii) as the cause of diseases classified elsewhere: Secondary | ICD-10-CM | POA: Diagnosis not present

## 2021-09-25 DIAGNOSIS — A0471 Enterocolitis due to Clostridium difficile, recurrent: Secondary | ICD-10-CM | POA: Diagnosis not present

## 2021-09-25 DIAGNOSIS — R0602 Shortness of breath: Secondary | ICD-10-CM | POA: Diagnosis not present

## 2021-09-25 DIAGNOSIS — M4627 Osteomyelitis of vertebra, lumbosacral region: Secondary | ICD-10-CM | POA: Diagnosis not present

## 2021-09-25 MED ORDER — GABAPENTIN 300 MG PO CAPS: 300.0000 mg | ORAL_CAPSULE | Freq: Every day | ORAL | 3 refills | Status: DC | PRN

## 2021-09-25 MED ORDER — POTASSIUM 99 MG PO TABS: 1.0000 | ORAL_TABLET | Freq: Every day | ORAL | Status: DC

## 2021-09-25 NOTE — Patient Instructions (Addendum)
Labs today  Chest xray today  Continue current medicines. May use gabapentin '300mg'$  as needed for nerve pain, may try for itching.  Good to see you today.

## 2021-09-25 NOTE — Progress Notes (Unsigned)
Patient ID: Brooke Beasley, female    DOB: 01/03/1931, 88 y.o.   MRN: [id number]  This visit was conducted in person.  BP 136/82   Pulse 89   Temp 97.7 F (36.5 C) (Temporal)   Ht '5\' 1"'$  (1.549 m)   Wt 150 lb 4 oz (68.2 kg)   SpO2 95%   BMI 28.39 kg/m    CC: hospital f/u visit  Subjective:   HPI: Brooke Beasley is a 88 y.o. female presenting on 09/25/2021 for Hospitalization Follow-up (Admitted on 09/06/21 at [facility], dx persistent afib; general weakness.  Pt accompanied by daughter, [name].  Wants to discuss gabapentin. )   Recent hospitalization for symptomatic severe hyponatremia in setting of recent commencement of chlorthalidone. ?SIADH. She needed 3% NaCl while monitoring in the ICU. She also had difficulty breathing and was placed on CPAP then BiPAP - rec continuing this outpatient. Did have previous recent hospitalization for proteus/citrobacter disciitis. Prolonged IV abx through PICC line. Due to persistent leukocytosis and fevers, PICC line removed on 08/28/2021 and she was transitioned to oral augmentin.  Hospital records reviewed. Med rec performed.   C diff ppx abx stopped 09/21/2021.  Synthroid dose increased to 124mg - rec rpt TSH in 3 months.  Zoloft was held due to concern over SIADH contribution. She continues klonopin 0.'5mg'$  nightly.  Lopressor was changed to carvedilol 6.'25mg'$  bid.  She was started on lasix '20mg'$  daily. They've started potassium with lasix.  Hydralazine '25mg'$  TID PRN - continued.   Rec BiPAP at home - told did not need outpatient sleep study.  Rec CXR and labs today.  Pending renal artery [place] for 09/29/2021 for further evaluation of fluctuating HTN.   Thought clinically OHS.  Continues tylenol 500/'1000mg'$  daily  BCarneyset up - PT started this week. OT - no need. Pending SN.  Other follow [place] appointments [place]. Pulmonology 11/13/2021 for evaluation of OSA.   Since discharge,  notes increased itching as well as feeling tired again.  ______________________________________________________________________ Hospital admission: 09/06/2021 Hospital discharge: 09/16/2021 TCM f/u phone call: performed 09/22/2021  Discharge diagnosis: Principal Problem:   Hyponatremia Active Problems:   Hypertension, essential   Hypothyroidism   Valvular heart disease   Stage 3a chronic kidney disease (CKD) ([place]   Discitis of lumbosacral region   Persistent atrial fibrillation (HCC)   DNR (do not resuscitate)/DNI(Do Not Intubate  Recommendations for Outpatient Follow-up:  Follow up with PCP in 1-2 weeks  PCP Please obtain BMP/CBC, 2 view CXR in 1week,  (see Discharge instructions)  PCP Please follow up on the following pending results: Monitor BMP closely, needs outpatient sleep study for OSA and to qualify for CPAP, recheck TSH in 3 months.  Home Health: PT, RN if she qualifies Equipment/Devices: as below  Consultations: Renal Discharge Condition: Stable    CODE STATUS: Full    Diet Recommendation: Heart Healthy with strict 1.2 L fluid restriction per day       Relevant past medical, surgical, family and social history reviewed and updated as indicated. Interim medical history since our last visit reviewed. Allergies and medications reviewed and updated. Outpatient Medications Prior to Visit  Medication Sig Dispense Refill   acetaminophen (TYLENOL) 500 MG tablet Take 500-1,000 mg by mouth every 6 (six) hours as needed for moderate pain. Depends on pain     albuterol (PROVENTIL HFA;VENTOLIN HFA) 108 (90 Base) MCG/ACT inhaler Inhale 2 puffs into the lungs every 6 (six) hours as needed  for wheezing or shortness of breath. 1 Inhaler 0   ALPHAGAN P 0.1 % SOLN Place 1 drop into both eyes daily.     apixaban (ELIQUIS) 5 MG TABS tablet Take 1 tablet (5 mg total) by mouth 2 (two) times daily. 60 tablet 2   bimatoprost (LUMIGAN) 0.01 % SOLN Place 1 drop into both eyes at bedtime.      bisacodyl (DULCOLAX) 5 MG EC tablet Take 1 tablet (5 mg total) by mouth daily as needed for moderate constipation. 30 tablet 1   carvedilol (COREG) 6.25 MG tablet Take 1 tablet (6.25 mg total) by mouth 2 (two) times daily with a meal. 60 tablet 0   cholecalciferol (VITAMIN D) 1000 units tablet Take 1,000 Units by mouth daily.     clonazePAM (KLONOPIN) 0.5 MG tablet TAKE 1 TABLET BY MOUTH AT BEDTIME 30 tablet 0   colchicine 0.6 MG tablet Take 1 tablet (0.6 mg total) by mouth daily as needed (gout flare). 90 tablet 0   Famotidine-Ca Carb-Mag Hydrox (PEPCID COMPLETE PO) Take 1 tablet by mouth daily as needed (indigestion).     ferrous sulfate 325 (65 FE) MG tablet Take 1 tablet (325 mg total) by mouth 2 (two) times daily with a meal. 60 tablet 3   furosemide (LASIX) 20 MG tablet Take 20 mg by mouth daily as needed for edema or fluid.     hydrALAZINE (APRESOLINE) 25 MG tablet Take 1 tablet (25 mg total) by mouth every 8 (eight) hours. 90 tablet 0   hydrocortisone 2.5 % cream Place 1 application rectally daily as needed (hemorrhoids).     Infant Care Products [facility]) OINT Apply 1 application topically 2 (two) times daily as needed. (Patient taking differently: Apply 1 application. topically 2 (two) times daily as needed (for skin).) 430 g 0   Iron-FA-B Cmp-C-Biot-Probiotic (FUSION PLUS) CAPS Take 1 capsule by mouth once daily 30 capsule 2   levothyroxine (SYNTHROID) 125 MCG tablet Take 1 tablet (125 mcg total) by mouth daily. 30 tablet 0   loperamide (IMODIUM) 2 MG capsule Take 1 capsule (2 mg total) by mouth 3 (three) times daily as needed for diarrhea or loose stools. 30 capsule 0   Misc Natural Products (OSTEO BI-FLEX TRIPLE STRENGTH PO) Take 1 tablet by mouth daily.     Multiple Vitamin (MULTIVITAMIN ADULT PO) Take by mouth daily.     omeprazole (PRILOSEC) 20 MG capsule Take 20 mg by mouth daily as needed (heartburn).     ondansetron (ZOFRAN-ODT) 4 MG disintegrating tablet Take 1 tablet (4 mg  total) by mouth every 8 (eight) hours as needed for nausea or vomiting. 20 tablet 0   polyethylene glycol (MIRALAX / GLYCOLAX) 17 g packet Take 17 g by mouth daily.     Simethicone 180 MG CAPS Take 1 capsule by mouth daily as needed (indigestion).     spironolactone (ALDACTONE) 25 MG tablet Take 1 tablet (25 mg total) by mouth daily. 90 tablet 3   VOLTAREN 1 % GEL APPLY ONE APPLICATION TOPICALLY 3 TIMES DAILY (Patient taking differently: Apply 2 g topically daily as needed (For pain).) 100 g 3   gabapentin (NEURONTIN) 400 MG capsule Take 400 mg by mouth daily.     No facility-administered medications prior to visit.     Per HPI unless specifically indicated in ROS section below Review of Systems  Objective:  BP 136/82   Pulse 89   Temp 97.7 F (36.5 C) (Temporal)   Ht '5\' 1"'$  (1.549 m)  Wt 150 lb 4 oz (68.2 kg)   SpO2 95%   BMI 28.39 kg/m   Wt Readings from Last 3 Encounters:  09/25/21 150 lb 4 oz (68.2 kg)  09/15/21 165 lb 5.5 oz (75 kg)  09/01/21 151 lb (68.5 kg)      Physical Exam    Results for orders placed or performed during the hospital encounter of 09/06/21  MRSA Next Gen by PCR, Nasal   Specimen: Nasal Mucosa; Nasal Swab  Result Value Ref Range   MRSA by PCR Next Gen NOT DETECTED NOT DETECTED  Basic metabolic panel  Result Value Ref Range   Sodium 114 (LL) 135 - 145 mmol/L   Potassium 3.6 3.5 - 5.1 mmol/L   Chloride 78 (L) 98 - 111 mmol/L   CO2 24 22 - 32 mmol/L   Glucose, Bld 119 (H) 70 - 99 mg/dL   BUN 14 8 - 23 mg/dL   Creatinine, Ser 0.86 0.44 - 1.00 mg/dL   Calcium 10.0 8.9 - 10.3 mg/dL   GFR, Estimated >60 >60 mL/min   Anion gap 12 5 - 15  CBC  Result Value Ref Range   WBC 11.7 (H) 4.0 - 10.5 K/uL   RBC 5.15 (H) 3.87 - 5.11 MIL/uL   Hemoglobin 14.6 12.0 - 15.0 g/dL   HCT 43.0 36.0 - 46.0 %   MCV 83.5 80.0 - 100.0 fL   MCH 28.3 26.0 - 34.0 pg   MCHC 34.0 30.0 - 36.0 g/dL   RDW 18.3 (H) 11.5 - 15.5 %   Platelets 357 150 - 400 K/uL   nRBC 0.0 0.0  - 0.2 %  Sodium  Result Value Ref Range   Sodium 114 (LL) 135 - 145 mmol/L  Urinalysis, Routine w reflex microscopic Urine, Clean Catch  Result Value Ref Range   Color, Urine YELLOW YELLOW   APPearance CLEAR CLEAR   Specific Gravity, Urine 1.011 1.005 - 1.030   pH 6.5 5.0 - 8.0   Glucose, UA NEGATIVE NEGATIVE mg/dL   Hgb urine dipstick NEGATIVE NEGATIVE   Bilirubin Urine NEGATIVE NEGATIVE   Ketones, ur NEGATIVE NEGATIVE mg/dL   Protein, ur NEGATIVE NEGATIVE mg/dL   Nitrite NEGATIVE NEGATIVE   Leukocytes,Ua NEGATIVE NEGATIVE  Basic metabolic panel  Result Value Ref Range   Sodium 113 (LL) 135 - 145 mmol/L   Potassium 3.5 3.5 - 5.1 mmol/L   Chloride 79 (L) 98 - 111 mmol/L   CO2 23 22 - 32 mmol/L   Glucose, Bld 124 (H) 70 - 99 mg/dL   BUN 12 8 - 23 mg/dL   Creatinine, Ser 0.72 0.44 - 1.00 mg/dL   Calcium 9.0 8.9 - 10.3 mg/dL   GFR, Estimated >60 >60 mL/min   Anion gap 11 5 - 15  Basic metabolic panel  Result Value Ref Range   Sodium 117 (LL) 135 - 145 mmol/L   Potassium 3.3 (L) 3.5 - 5.1 mmol/L   Chloride 82 (L) 98 - 111 mmol/L   CO2 23 22 - 32 mmol/L   Glucose, Bld 133 (H) 70 - 99 mg/dL   BUN 10 8 - 23 mg/dL   Creatinine, Ser 0.84 0.44 - 1.00 mg/dL   Calcium 9.1 8.9 - 10.3 mg/dL   GFR, Estimated >60 >60 mL/min   Anion gap 12 5 - 15  Osmolality  Result Value Ref Range   Osmolality 242 (LL) 275 - 295 mOsm/kg  Osmolality, urine  Result Value Ref Range   Osmolality, Ur 347 300 -  900 mOsm/kg  TSH  Result Value Ref Range   TSH 18.420 (H) 0.350 - 4.500 uIU/mL  Cortisol  Result Value Ref Range   Cortisol, Plasma 31.4 ug/dL  T4, free  Result Value Ref Range   Free T4 0.96 0.61 - 1.12 ng/dL  Basic metabolic panel  Result Value Ref Range   Sodium 113 (LL) 135 - 145 mmol/L   Potassium 3.1 (L) 3.5 - 5.1 mmol/L   Chloride 79 (L) 98 - 111 mmol/L   CO2 21 (L) 22 - 32 mmol/L   Glucose, Bld 148 (H) 70 - 99 mg/dL   BUN 10 8 - 23 mg/dL   Creatinine, Ser 0.81 0.44 - 1.00  mg/dL   Calcium 8.9 8.9 - 10.3 mg/dL   GFR, Estimated >60 >60 mL/min   Anion gap 13 5 - 15  Sodium, urine, random  Result Value Ref Range   Sodium, Ur 59 mmol/L  Basic metabolic panel  Result Value Ref Range   Sodium 115 (LL) 135 - 145 mmol/L   Potassium 4.0 3.5 - 5.1 mmol/L   Chloride 82 (L) 98 - 111 mmol/L   CO2 22 22 - 32 mmol/L   Glucose, Bld 152 (H) 70 - 99 mg/dL   BUN 11 8 - 23 mg/dL   Creatinine, Ser 0.77 0.44 - 1.00 mg/dL   Calcium 8.6 (L) 8.9 - 10.3 mg/dL   GFR, Estimated >60 >60 mL/min   Anion gap 11 5 - 15  Sodium  Result Value Ref Range   Sodium 114 (LL) 135 - 145 mmol/L  Basic metabolic panel  Result Value Ref Range   Sodium 116 (LL) 135 - 145 mmol/L   Potassium 3.5 3.5 - 5.1 mmol/L   Chloride 85 (L) 98 - 111 mmol/L   CO2 20 (L) 22 - 32 mmol/L   Glucose, Bld 112 (H) 70 - 99 mg/dL   BUN 9 8 - 23 mg/dL   Creatinine, Ser 0.66 0.44 - 1.00 mg/dL   Calcium 8.2 (L) 8.9 - 10.3 mg/dL   GFR, Estimated >60 >60 mL/min   Anion gap 11 5 - 15  CBC  Result Value Ref Range   WBC 9.0 4.0 - 10.5 K/uL   RBC 4.47 3.87 - 5.11 MIL/uL   Hemoglobin 13.1 12.0 - 15.0 g/dL   HCT 37.4 36.0 - 46.0 %   MCV 83.7 80.0 - 100.0 fL   MCH 29.3 26.0 - 34.0 pg   MCHC 35.0 30.0 - 36.0 g/dL   RDW 17.7 (H) 11.5 - 15.5 %   Platelets 318 150 - 400 K/uL   nRBC 0.0 0.0 - 0.2 %  Sodium  Result Value Ref Range   Sodium 123 (L) 135 - 145 mmol/L  Sodium  Result Value Ref Range   Sodium 121 (L) 135 - 145 mmol/L  Sodium  Result Value Ref Range   Sodium 122 (L) 135 - 145 mmol/L  Sodium  Result Value Ref Range   Sodium 120 (L) 135 - 145 mmol/L  Basic metabolic panel  Result Value Ref Range   Sodium 121 (L) 135 - 145 mmol/L   Potassium 3.5 3.5 - 5.1 mmol/L   Chloride 89 (L) 98 - 111 mmol/L   CO2 23 22 - 32 mmol/L   Glucose, Bld 122 (H) 70 - 99 mg/dL   BUN 11 8 - 23 mg/dL   Creatinine, Ser 0.75 0.44 - 1.00 mg/dL   Calcium 8.7 (L) 8.9 - 10.3 mg/dL   GFR, Estimated >60 >  60 mL/min   Anion gap  9 5 - 15  Sodium  Result Value Ref Range   Sodium 123 (L) 135 - 145 mmol/L  Sodium  Result Value Ref Range   Sodium 123 (L) 135 - 145 mmol/L  Renal function panel  Result Value Ref Range   Sodium 120 (L) 135 - 145 mmol/L   Potassium 3.7 3.5 - 5.1 mmol/L   Chloride 88 (L) 98 - 111 mmol/L   CO2 21 (L) 22 - 32 mmol/L   Glucose, Bld 151 (H) 70 - 99 mg/dL   BUN 10 8 - 23 mg/dL   Creatinine, Ser 0.77 0.44 - 1.00 mg/dL   Calcium 8.8 (L) 8.9 - 10.3 mg/dL   Phosphorus 2.1 (L) 2.5 - 4.6 mg/dL   Albumin 2.9 (L) 3.5 - 5.0 g/dL   GFR, Estimated >60 >60 mL/min   Anion gap 11 5 - 15  Basic metabolic panel  Result Value Ref Range   Sodium 121 (L) 135 - 145 mmol/L   Potassium 3.4 (L) 3.5 - 5.1 mmol/L   Chloride 90 (L) 98 - 111 mmol/L   CO2 24 22 - 32 mmol/L   Glucose, Bld 183 (H) 70 - 99 mg/dL   BUN 11 8 - 23 mg/dL   Creatinine, Ser 0.93 0.44 - 1.00 mg/dL   Calcium 9.0 8.9 - 10.3 mg/dL   GFR, Estimated 59 (L) >60 mL/min   Anion gap 7 5 - 15  Renal function panel  Result Value Ref Range   Sodium 124 (L) 135 - 145 mmol/L   Potassium 4.4 3.5 - 5.1 mmol/L   Chloride 92 (L) 98 - 111 mmol/L   CO2 23 22 - 32 mmol/L   Glucose, Bld 145 (H) 70 - 99 mg/dL   BUN 13 8 - 23 mg/dL   Creatinine, Ser 0.81 0.44 - 1.00 mg/dL   Calcium 9.0 8.9 - 10.3 mg/dL   Phosphorus 2.1 (L) 2.5 - 4.6 mg/dL   Albumin 2.8 (L) 3.5 - 5.0 g/dL   GFR, Estimated >60 >60 mL/min   Anion gap 9 5 - 15  Basic metabolic panel  Result Value Ref Range   Sodium 124 (L) 135 - 145 mmol/L   Potassium 3.7 3.5 - 5.1 mmol/L   Chloride 89 (L) 98 - 111 mmol/L   CO2 25 22 - 32 mmol/L   Glucose, Bld 116 (H) 70 - 99 mg/dL   BUN 20 8 - 23 mg/dL   Creatinine, Ser 0.72 0.44 - 1.00 mg/dL   Calcium 9.0 8.9 - 10.3 mg/dL   GFR, Estimated >60 >60 mL/min   Anion gap 10 5 - 15  Basic metabolic panel  Result Value Ref Range   Sodium 125 (L) 135 - 145 mmol/L   Potassium 4.0 3.5 - 5.1 mmol/L   Chloride 88 (L) 98 - 111 mmol/L   CO2 24 22 - 32  mmol/L   Glucose, Bld 142 (H) 70 - 99 mg/dL   BUN 15 8 - 23 mg/dL   Creatinine, Ser 0.80 0.44 - 1.00 mg/dL   Calcium 9.2 8.9 - 10.3 mg/dL   GFR, Estimated >60 >60 mL/min   Anion gap 13 5 - 15  Basic metabolic panel  Result Value Ref Range   Sodium 127 (L) 135 - 145 mmol/L   Potassium 4.1 3.5 - 5.1 mmol/L   Chloride 90 (L) 98 - 111 mmol/L   CO2 29 22 - 32 mmol/L   Glucose, Bld 113 (H) 70 -  99 mg/dL   BUN 19 8 - 23 mg/dL   Creatinine, Ser 0.82 0.44 - 1.00 mg/dL   Calcium 9.1 8.9 - 10.3 mg/dL   GFR, Estimated >60 >60 mL/min   Anion gap 8 5 - 15  Magnesium  Result Value Ref Range   Magnesium 1.6 (L) 1.7 - 2.4 mg/dL  Comprehensive metabolic panel  Result Value Ref Range   Sodium 130 (L) 135 - 145 mmol/L   Potassium 3.6 3.5 - 5.1 mmol/L   Chloride 92 (L) 98 - 111 mmol/L   CO2 27 22 - 32 mmol/L   Glucose, Bld 102 (H) 70 - 99 mg/dL   BUN 20 8 - 23 mg/dL   Creatinine, Ser 0.83 0.44 - 1.00 mg/dL   Calcium 9.2 8.9 - 10.3 mg/dL   Total Protein 6.4 (L) 6.5 - 8.1 g/dL   Albumin 2.7 (L) 3.5 - 5.0 g/dL   AST 27 15 - 41 U/L   ALT 24 0 - 44 U/L   Alkaline Phosphatase 85 38 - 126 U/L   Total Bilirubin 0.4 0.3 - 1.2 mg/dL   GFR, Estimated >60 >60 mL/min   Anion gap 11 5 - 15  CBC with Differential/Platelet  Result Value Ref Range   WBC 10.5 4.0 - 10.5 K/uL   RBC 4.17 3.87 - 5.11 MIL/uL   Hemoglobin 12.1 12.0 - 15.0 g/dL   HCT 36.9 36.0 - 46.0 %   MCV 88.5 80.0 - 100.0 fL   MCH 29.0 26.0 - 34.0 pg   MCHC 32.8 30.0 - 36.0 g/dL   RDW 18.8 (H) 11.5 - 15.5 %   Platelets 283 150 - 400 K/uL   nRBC 0.0 0.0 - 0.2 %   Neutrophils Relative % 51 %   Neutro Abs 5.4 1.7 - 7.7 K/uL   Lymphocytes Relative 28 %   Lymphs Abs 2.9 0.7 - 4.0 K/uL   Monocytes Relative 13 %   Monocytes Absolute 1.3 (H) 0.1 - 1.0 K/uL   Eosinophils Relative 7 %   Eosinophils Absolute 0.7 (H) 0.0 - 0.5 K/uL   Basophils Relative 1 %   Basophils Absolute 0.1 0.0 - 0.1 K/uL   Immature Granulocytes 0 %   Abs Immature  Granulocytes 0.04 0.00 - 0.07 K/uL  Magnesium  Result Value Ref Range   Magnesium 2.2 1.7 - 2.4 mg/dL  Comprehensive metabolic panel  Result Value Ref Range   Sodium 132 (L) 135 - 145 mmol/L   Potassium 3.7 3.5 - 5.1 mmol/L   Chloride 95 (L) 98 - 111 mmol/L   CO2 29 22 - 32 mmol/L   Glucose, Bld 117 (H) 70 - 99 mg/dL   BUN 21 8 - 23 mg/dL   Creatinine, Ser 0.81 0.44 - 1.00 mg/dL   Calcium 9.0 8.9 - 10.3 mg/dL   Total Protein 6.0 (L) 6.5 - 8.1 g/dL   Albumin 2.6 (L) 3.5 - 5.0 g/dL   AST 26 15 - 41 U/L   ALT 23 0 - 44 U/L   Alkaline Phosphatase 87 38 - 126 U/L   Total Bilirubin 0.6 0.3 - 1.2 mg/dL   GFR, Estimated >60 >60 mL/min   Anion gap 8 5 - 15  Magnesium  Result Value Ref Range   Magnesium 1.8 1.7 - 2.4 mg/dL  Comprehensive metabolic panel  Result Value Ref Range   Sodium 134 (L) 135 - 145 mmol/L   Potassium 3.7 3.5 - 5.1 mmol/L   Chloride 96 (L) 98 - 111 mmol/L  CO2 28 22 - 32 mmol/L   Glucose, Bld 111 (H) 70 - 99 mg/dL   BUN 19 8 - 23 mg/dL   Creatinine, Ser 0.83 0.44 - 1.00 mg/dL   Calcium 9.2 8.9 - 10.3 mg/dL   Total Protein 6.1 (L) 6.5 - 8.1 g/dL   Albumin 2.7 (L) 3.5 - 5.0 g/dL   AST 23 15 - 41 U/L   ALT 22 0 - 44 U/L   Alkaline Phosphatase 79 38 - 126 U/L   Total Bilirubin 0.5 0.3 - 1.2 mg/dL   GFR, Estimated >60 >60 mL/min   Anion gap 10 5 - 15  Magnesium  Result Value Ref Range   Magnesium 1.8 1.7 - 2.4 mg/dL  Comprehensive metabolic panel  Result Value Ref Range   Sodium 134 (L) 135 - 145 mmol/L   Potassium 3.7 3.5 - 5.1 mmol/L   Chloride 100 98 - 111 mmol/L   CO2 28 22 - 32 mmol/L   Glucose, Bld 119 (H) 70 - 99 mg/dL   BUN 16 8 - 23 mg/dL   Creatinine, Ser 0.82 0.44 - 1.00 mg/dL   Calcium 9.2 8.9 - 10.3 mg/dL   Total Protein 5.9 (L) 6.5 - 8.1 g/dL   Albumin 2.6 (L) 3.5 - 5.0 g/dL   AST 22 15 - 41 U/L   ALT 18 0 - 44 U/L   Alkaline Phosphatase 85 38 - 126 U/L   Total Bilirubin 0.2 (L) 0.3 - 1.2 mg/dL   GFR, Estimated >60 >60 mL/min   Anion  gap 6 5 - 15  Basic metabolic panel  Result Value Ref Range   Sodium 137 135 - 145 mmol/L   Potassium 3.9 3.5 - 5.1 mmol/L   Chloride 97 (L) 98 - 111 mmol/L   CO2 31 22 - 32 mmol/L   Glucose, Bld 114 (H) 70 - 99 mg/dL   BUN 17 8 - 23 mg/dL   Creatinine, Ser 0.89 0.44 - 1.00 mg/dL   Calcium 9.6 8.9 - 10.3 mg/dL   GFR, Estimated >60 >60 mL/min   Anion gap 9 5 - 15  Pulmonary Function Test  Result Value Ref Range   FVC-Pre 1.87 L   FVC-%Pred-Pre 98 %   FEV1-Pre 1.34 L   FEV1-%Pred-Pre 97 %   FEV6-Pre 1.84 L   FEV6-%Pred-Pre 104 %   Pre FEV1/FVC ratio 72 %   FEV1FVC-%Pred-Pre 99 %   Pre FEV6/FVC Ratio 98 %   FEV6FVC-%Pred-Pre 106 %   FEF 25-75 Pre 0.89 L/sec   FEF2575-%Pred-Pre 108 %  Troponin I (High Sensitivity)  Result Value Ref Range   Troponin I (High Sensitivity) 6 <18 ng/L  Troponin I (High Sensitivity)  Result Value Ref Range   Troponin I (High Sensitivity) 4 <18 ng/L    Assessment & Plan:   Problem List Items Addressed This Visit     Hyponatremia - Primary   Relevant Orders   DG Chest 2 View   CBC with Differential/Platelet   Comprehensive metabolic panel     Meds ordered this encounter  Medications   Potassium 99 MG TABS    Sig: Take 1 tablet (99 mg total) by mouth daily.   gabapentin (NEURONTIN) 300 MG capsule    Sig: Take 1 capsule (300 mg total) by mouth daily as needed (nerve pain, itching).    Dispense:  30 capsule    Refill:  3   Orders Placed This Encounter  Procedures   DG Chest 2 View  Standing Status:   Future    Standing Expiration Date:   09/26/2022    Order Specific Question:   Reason for Exam (SYMPTOM  OR DIAGNOSIS REQUIRED)    Answer:   follow up CXR    Order Specific Question:   Preferred imaging location?    Answer:   [facility]   CBC with Differential/Platelet   Comprehensive metabolic panel     ***Follow up plan: Return in about 3 months (around 12/26/2021), or if symptoms worsen or fail to improve.  [name], MD

## 2021-09-26 ENCOUNTER — Other Ambulatory Visit: Payer: Self-pay | Admitting: Family Medicine

## 2021-09-26 DIAGNOSIS — J9691 Respiratory failure, unspecified with hypoxia: Secondary | ICD-10-CM | POA: Insufficient documentation

## 2021-09-26 LAB — CBC WITH DIFFERENTIAL/PLATELET
Absolute Monocytes: 992 {cells}/uL — ABNORMAL HIGH (ref 200–950)
Basophils Absolute: 37 {cells}/uL (ref 0–200)
Basophils Relative: 0.5 %
Eosinophils Absolute: 518 {cells}/uL — ABNORMAL HIGH (ref 15–500)
Eosinophils Relative: 7 %
HCT: 41.6 % (ref 35.0–45.0)
Hemoglobin: 13.4 g/dL (ref 11.7–15.5)
Lymphs Abs: 2612 {cells}/uL (ref 850–3900)
MCH: 29.5 pg (ref 27.0–33.0)
MCHC: 32.2 g/dL (ref 32.0–36.0)
MCV: 91.4 fL (ref 80.0–100.0)
MPV: 9.2 fL (ref 7.5–12.5)
Monocytes Relative: 13.4 %
Neutro Abs: 3241 {cells}/uL (ref 1500–7800)
Neutrophils Relative %: 43.8 %
Platelets: 392 Thousand/uL (ref 140–400)
RBC: 4.55 Million/uL (ref 3.80–5.10)
RDW: 16.7 % — ABNORMAL HIGH (ref 11.0–15.0)
Total Lymphocyte: 35.3 %
WBC: 7.4 Thousand/uL (ref 3.8–10.8)

## 2021-09-26 LAB — COMPREHENSIVE METABOLIC PANEL
AG Ratio: 1.2 (calc) (ref 1.0–2.5)
ALT: 32 U/L — ABNORMAL HIGH (ref 6–29)
AST: 37 U/L — ABNORMAL HIGH (ref 10–35)
Albumin: 4.1 g/dL (ref 3.6–5.1)
Alkaline phosphatase (APISO): 101 U/L (ref 37–153)
BUN/Creatinine Ratio: 23 (calc) — ABNORMAL HIGH (ref 6–22)
BUN: 24 mg/dL (ref 7–25)
CO2: 32 mmol/L (ref 20–32)
Calcium: 10.8 mg/dL — ABNORMAL HIGH (ref 8.6–10.4)
Chloride: 97 mmol/L — ABNORMAL LOW (ref 98–110)
Creat: 1.05 mg/dL — ABNORMAL HIGH (ref 0.60–0.95)
Globulin: 3.3 g/dL (calc) (ref 1.9–3.7)
Glucose, Bld: 81 mg/dL (ref 65–99)
Potassium: 4.4 mmol/L (ref 3.5–5.3)
Sodium: 139 mmol/L (ref 135–146)
Total Bilirubin: 0.6 mg/dL (ref 0.2–1.2)
Total Protein: 7.4 g/dL (ref 6.1–8.1)

## 2021-09-26 MED ORDER — FUROSEMIDE 20 MG PO TABS: 20.0000 mg | ORAL_TABLET | Freq: Every day | ORAL | 6 refills | Status: DC

## 2021-09-26 MED ORDER — POTASSIUM 99 MG PO TABS: 1.0000 | ORAL_TABLET | ORAL | Status: DC

## 2021-09-26 MED ORDER — LEVOTHYROXINE SODIUM 125 MCG PO TABS: 125.0000 ug | ORAL_TABLET | Freq: Every day | ORAL | 6 refills | Status: DC

## 2021-09-26 NOTE — Assessment & Plan Note (Signed)
Overall improved from this standpoint.  She has completed prolonged antibiotic course with benefit.  Pain overall controlled only with tylenol - continue this.  Discussed gabapentin dose as per below - will provide '300mg'$  capsules to use PRN nerve/back pain.

## 2021-09-26 NOTE — Assessment & Plan Note (Signed)
Completed vancomycin course for ppx C diff prevention while on recent prolonged antibiotic course for disciitis

## 2021-09-26 NOTE — Assessment & Plan Note (Signed)
Update sodium levels off chlorthalidone and sertraline. ?SIADH component.

## 2021-09-26 NOTE — Assessment & Plan Note (Signed)
TSH was elevated - Synthroid dose was increased to 162mg daily. Will need rpt TFTs at f/u visit.

## 2021-09-26 NOTE — Assessment & Plan Note (Signed)
Currently only on klonopin nightly.  Sertraline on hold with recent hyponatremia - consider restarting pending labs/mood.

## 2021-09-26 NOTE — Assessment & Plan Note (Addendum)
H/o fluctuating blood pressures as well as multiple drug intolerances - overall stable period on current regimen.  Pending renal artery [place] next week.  Planned cards f/u afterwards.  Current BP regimen is carvedilol 6.'25mg'$  bid, lasix '20mg'$  daily, hydralazine '25mg'$  tid (continues using prn), spironolactone '25mg'$  daily.

## 2021-09-26 NOTE — Assessment & Plan Note (Addendum)
Noted during latest hospitalization - OSA vs [facility] related. Unclear if needs formal sleep study outpatient - one note says it is needed, another says not needed. Started on BiPAP and supplemental oxygen with benefit, has planned pulm f/u for further evaluation. Will continue BiPAP with sleep for now. Update CXR today.

## 2021-09-26 NOTE — Assessment & Plan Note (Signed)
Chronic, present since 04/2021, returning. Unclear etiology.  Check LFTs, Update BUN/Cr to r/o uremia or other systemic cause.  ?med related.  Consider starting gabapentin vs doxepin for pruritis.

## 2021-09-26 NOTE — Assessment & Plan Note (Signed)
Stable period on eliquis and now carvedilol.

## 2021-09-29 ENCOUNTER — Ambulatory Visit (INDEPENDENT_AMBULATORY_CARE_PROVIDER_SITE_OTHER): Payer: PPO

## 2021-09-29 DIAGNOSIS — I10 Essential (primary) hypertension: Secondary | ICD-10-CM | POA: Diagnosis not present

## 2021-09-30 ENCOUNTER — Encounter: Payer: Self-pay | Admitting: Family Medicine

## 2021-10-05 ENCOUNTER — Encounter: Payer: Self-pay | Admitting: Cardiology

## 2021-10-05 ENCOUNTER — Ambulatory Visit: Payer: PPO | Admitting: Cardiology

## 2021-10-05 VITALS — BP 130/84 | HR 78 | Ht 61.0 in | Wt 151.0 lb

## 2021-10-05 DIAGNOSIS — I4892 Unspecified atrial flutter: Secondary | ICD-10-CM | POA: Diagnosis not present

## 2021-10-05 DIAGNOSIS — I350 Nonrheumatic aortic (valve) stenosis: Secondary | ICD-10-CM

## 2021-10-05 DIAGNOSIS — I10 Essential (primary) hypertension: Secondary | ICD-10-CM | POA: Diagnosis not present

## 2021-10-05 MED ORDER — HYDROXYZINE HCL 25 MG PO TABS
12.5000 mg | ORAL_TABLET | Freq: Two times a day (BID) | ORAL | 0 refills | Status: DC | PRN
Start: 2021-10-05 — End: 2021-10-09

## 2021-10-05 NOTE — Progress Notes (Signed)
Cardiology Office Note:    Date:  10/05/2021   ID:  Brooke Beasley, DOB 08/10/1930, MRN [id number]  PCP:  [name], MD  [facility] Cardiologist:  Kate Sable, MD  [facility] Electrophysiologist:  None   Referring MD: [name], MD   Chief Complaint  Patient presents with   Hospitalization Follow-up    Discuss renal U/S results    History of Present Illness:    Brooke Beasley is a 88 y.o. female with a hx of anxiety, persistent A-fib, grade 2 diastolic dysfunction, hypertension, hyperlipidemia, CKD 3, mild aortic stenosis who presents for follow-up.    Being seen for hypertension, medication management.  Intolerant to several medications including lisinopril, amlodipine.  Currently takes hydralazine, Aldactone, carvedilol.  Renal arterial ultrasound was ordered to evaluate renal artery stenosis.    Tolerating Eliquis without any bleeding issues.  Denies palpitations, or chest pain.  She has occasional dizziness and fatigue.   Prior notes Echo TTE 07/2021 EF 55 to 60%, moderately dilated LA, moderate MR, posterior mitral leaflet prolapse Echocardiogram on 11/26/2019 showed normal systolic function, EF 60 to 19%, grade 2 diastolic dysfunction.  Mild to moderate MR, mild AS. Renal artery stenosis about 1 to 59% on the right, left renal artery unsuccessful to assess  Patient previously took lisinopril, but stopped due to side effects/cough.  Amlodipine stopped due to abdominal pains, Coreg was stopped because of fatigue.  Past Medical History:  Diagnosis Date   Anxiety    C. difficile diarrhea 07/11/2018   S/p multiple oral vanc treatments (course and tapers) and completed Zinplava monoclonal Ab treatment (05/10/2019) Fecal transplant on hold during COVID pandemic   CAP (community acquired pneumonia) 12/22/2017   Carotid stenosis 10/18/2014   R 1-39%, L 40-59%, rpt 1 yr (09/2014)    CKD (chronic kidney disease) stage 3, GFR 30-59 ml/min  (HCC) 08/02/2014   Closed fracture of right orbit ([place]) 02/10/2021   Clostridioides difficile infection    hx 2020   COVID-19 virus infection 10/13/2020   Degenerative disc disease    LS   Depression    nervous breakdonw in 1964-out of work for a year   Diverticulosis    severe by colonoscopy   Family history of adverse reaction to anesthesia    n/v   GERD (gastroesophageal reflux disease)    Glaucoma    Heart murmur 08/2011   mitral regurge - on echo    History of hiatal hernia    History of shingles    HTN (hypertension)    Hyperlipidemia    Hypertension    Hypothyroidism    IBS 11/02/2006   Intestinal bacterial overgrowth    In small colon   Left shoulder pain 11/04/2014   Lower GI bleed 12/2020   thought diverticular complicated by ABLA with syncope and orbital fracture s/p hospitalization   Maxillary fracture ([place]) 04/08/2012   Osteoarthritis 2016   Jefm Bryant)   Osteoporosis    dexa 2011   Perirectal abscess 08/03/2021   CT guided aspiration of abscess grow Proteums mirabilis and Citrobacter koseri 07/2021, currently receiving IV Zosyn planned total 6 wks.   Pseudogout 2016   shoulders ([place])    Past Surgical History:  Procedure Laterality Date   barium enema  2012   severe diverticulosis, redundant colon   Bowel obstruction  1999   no surgery in hosp x 3 days   BUBBLE STUDY  07/27/2021   Procedure: BUBBLE STUDY;  Surgeon: Dixie Dials, MD;  Location: [facility]  ENDOSCOPY;  Service: Cardiovascular;;   COLONOSCOPY  1. 1999  2. 11/04   1. Not finished  2. Slight hemorrhage rectosigmoid area, severe sig diverticulosis   COLONOSCOPY WITH PROPOFOL N/A 09/10/2019   SSP with dysplasia, TA, rpt 3 yrs Marius Ditch, Tally Due, MD)   Dexa  1. [phone]   2. 9/04  3. 3/08    1. OP  2. OP, borderline, spine -2.44T  3. decreased BMD-OP   DG KNEE 1-2 VIEWS BILAT     LS x-ray with degenerative disc and facet change   ESOPHAGOGASTRODUODENOSCOPY     Negative   FLEXIBLE SIGMOIDOSCOPY  N/A 01/16/2021   Procedure: FLEXIBLE SIGMOIDOSCOPY;  Surgeon: Gatha Mayer, MD;  Location: [place];  Service: Endoscopy;  Laterality: N/A;  or unsedated   Hemorrhoid procedure  07/2006   PICC LINE REMOVAL ([place] HX)  08/28/2021   PROCTOSCOPY N/A 05/26/2021   Procedure: RIGID PROCTOSCOPY;  Surgeon: Michael Boston, MD;  Location: [facility];  Service: General;  Laterality: N/A;   RECTOPEXY N/A 05/26/2021   Procedure: RECTOPEXY;  Surgeon: Michael Boston, MD;  Location: [facility];  Service: General;  Laterality: N/A;   TEE WITHOUT CARDIOVERSION N/A 07/27/2021   Procedure: TRANSESOPHAGEAL ECHOCARDIOGRAM (TEE);  Surgeon: Dixie Dials, MD;  Location: [facility] ENDOSCOPY;  Service: Cardiovascular;  Laterality: N/A;   TONSILLECTOMY     TOTAL SHOULDER ARTHROPLASTY Left 11/27/2015   Corky Mull, MD   TOTAL SHOULDER REVISION Left 03/16/2016   Procedure: TOTAL SHOULDER REVISION;  Surgeon: Corky Mull, MD;  Location: [facility] ORS;  Service: Orthopedics;  Laterality: Left;   US ECHOCARDIOGRAPHY  93/2355   Normal systolic fxn with EF 73-22%.  Focal basal septal hypertrophy.  Mild diastolic dysfunction.  Mild MR.   XI ROBOTIC ASSISTED LOWER ANTERIOR RESECTION N/A 05/26/2021   Procedure: ROBOTIC LOW ANTERIOR RECTOSIGMOID RESECTION; ASSESSMENT OF TISSUE PERFUSION WITH FIREFLY;  Surgeon: Michael Boston, MD;  Location: [facility];  Service: General;  Laterality: N/A;    Current Medications: Current Meds  Medication Sig   acetaminophen (TYLENOL) 500 MG tablet Take 500-1,000 mg by mouth every 6 (six) hours as needed for moderate pain. Depends on pain   albuterol (PROVENTIL HFA;VENTOLIN HFA) 108 (90 Base) MCG/ACT inhaler Inhale 2 puffs into the lungs every 6 (six) hours as needed for wheezing or shortness of breath.   ALPHAGAN P 0.1 % SOLN Place 1 drop into both eyes daily.   apixaban (ELIQUIS) 5 MG TABS tablet Take 1 tablet (5 mg total) by mouth 2 (two) times daily.   bimatoprost (LUMIGAN) 0.01 % SOLN Place 1 drop into both  eyes at bedtime.   bisacodyl (DULCOLAX) 5 MG EC tablet Take 1 tablet (5 mg total) by mouth daily as needed for moderate constipation.   carvedilol (COREG) 6.25 MG tablet Take 1 tablet (6.25 mg total) by mouth 2 (two) times daily with a meal.   cholecalciferol (VITAMIN D) 1000 units tablet Take 1,000 Units by mouth daily.   clonazePAM (KLONOPIN) 0.5 MG tablet TAKE 1 TABLET BY MOUTH AT BEDTIME   colchicine 0.6 MG tablet Take 1 tablet (0.6 mg total) by mouth daily as needed (gout flare).   diclofenac Sodium (VOLTAREN) 1 % GEL Apply 1 application  topically 3 (three) times daily as needed.   Famotidine-Ca Carb-Mag Hydrox (PEPCID COMPLETE PO) Take 1 tablet by mouth daily as needed (indigestion).   ferrous sulfate 325 (65 FE) MG tablet Take 1 tablet (325 mg total) by mouth 2 (two) times daily with a  meal.   furosemide (LASIX) 20 MG tablet Take 1 tablet (20 mg total) by mouth daily.   gabapentin (NEURONTIN) 300 MG capsule Take 1 capsule (300 mg total) by mouth daily as needed (nerve pain, itching).   hydrALAZINE (APRESOLINE) 25 MG tablet Take 1 tablet (25 mg total) by mouth every 8 (eight) hours.   hydrocortisone 2.5 % cream Place 1 application rectally daily as needed (hemorrhoids).   hydrOXYzine (ATARAX) 25 MG tablet Take 0.5-1 tablets (12.5-25 mg total) by mouth 2 (two) times daily as needed for itching.   Infant Care Products [facility]) OINT Apply 1 application topically 2 (two) times daily as needed.   Iron-FA-B Cmp-C-Biot-Probiotic (FUSION PLUS) CAPS Take 1 capsule by mouth once daily   levothyroxine (SYNTHROID) 125 MCG tablet Take 1 tablet (125 mcg total) by mouth daily.   loperamide (IMODIUM) 2 MG capsule Take 1 capsule (2 mg total) by mouth 3 (three) times daily as needed for diarrhea or loose stools.   Misc Natural Products (OSTEO BI-FLEX TRIPLE STRENGTH PO) Take 1 tablet by mouth daily.   Multiple Vitamin (MULTIVITAMIN ADULT PO) Take by mouth daily.   omeprazole (PRILOSEC) 20 MG capsule  Take 20 mg by mouth daily as needed (heartburn).   ondansetron (ZOFRAN-ODT) 4 MG disintegrating tablet Take 1 tablet (4 mg total) by mouth every 8 (eight) hours as needed for nausea or vomiting.   polyethylene glycol (MIRALAX / GLYCOLAX) 17 g packet Take 17 g by mouth daily.   Potassium 99 MG TABS Take 1 tablet (99 mg total) by mouth every Monday, Wednesday, and Friday.   Simethicone 180 MG CAPS Take 1 capsule by mouth daily as needed (indigestion).   spironolactone (ALDACTONE) 25 MG tablet Take 1 tablet (25 mg total) by mouth daily.     Allergies:   Amlodipine, Ace inhibitors, Carvedilol, Chlorthalidone, Losartan, Other, Paroxetine hcl, Risedronate sodium, Simvastatin, Toprol xl [metoprolol], Alendronate sodium, Influenza vaccines, Silenor [doxepin hcl], and Sulfonamide derivatives   Social History   Socioeconomic History   Marital status: Widowed    Spouse name: Not on file   Number of children: 2   Years of education: Not on file   Highest education level: Not on file  Occupational History   Occupation: Retired    Fish farm manager: RETIRED  Tobacco Use   Smoking status: Never   Smokeless tobacco: Never  Vaping Use   Vaping Use: Never used  Substance and Sexual Activity   Alcohol use: No   Drug use: No   Sexual activity: Not Currently  Other Topics Concern   Not on file  Social History Narrative   Left handed   Widow. Husband ([name]) deceased 2016-03-19 from dementia. She was caregiver.    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   Activity: no regular exercise   Diet: good water, fruits/vegetables daily      Patient Care Team:   [name], MD as PCP - General (Family Medicine)   Kate Sable, MD as PCP - Cardiology (Cardiology)   Dannielle Karvonen, RN as [place] [facility]   Gross, Remo Lipps, MD as Consulting Physician (General Surgery)   Lin Landsman, MD as Consulting  Physician (Gastroenterology)   Kate Sable, MD as Consulting Physician (Cardiology)   Social Determinants of Health   Financial Resource Strain: Low Risk  (03/30/2021)   Overall Financial Resource Strain (CARDIA)    Difficulty of Paying Living Expenses:  Not very hard  Food Insecurity: No Food Insecurity (03/30/2021)   Hunger Vital Sign    Worried About Running Out of Food in the Last Year: Never true    Ran Out of Food in the Last Year: Never true  Transportation Needs: No Transportation Needs (03/30/2021)   PRAPARE - Hydrologist (Medical): No    Lack of Transportation (Non-Medical): No  Physical Activity: Insufficiently Active (03/30/2021)   Exercise Vital Sign    Days of Exercise per Week: 7 days    Minutes of Exercise per Session: 10 min  Stress: No Stress Concern Present (03/30/2021)   [place]    Feeling of Stress : Only a little  Social Connections: Socially Isolated (03/30/2021)   Social Connection and Isolation Panel [NHANES]    Frequency of Communication with Friends and Family: More than three times a week    Frequency of Social Gatherings with Friends and Family: Three times a week    Attends Religious Services: Never    Active Member of Clubs or Organizations: No    Attends Archivist Meetings: Never    Marital Status: Widowed     Family History: The patient's family history includes Breast cancer in her cousin; Coronary artery disease in her brother; Diabetes in her brother and paternal grandfather.  ROS:   Please see the history of present illness.     All other systems reviewed and are negative.  EKGs/Labs/Other Studies Reviewed:    The following studies were reviewed today:   EKG:  EKG is ordered today.  EKG shows atrial flutter, heart rate 78  Recent Labs: 02/10/2021: Pro B Natriuretic peptide (BNP) 165.0 09/07/2021: TSH 18.420 09/16/2021:  Magnesium 1.8 09/25/2021: ALT 32; BUN 24; Creat 1.05; Hemoglobin 13.4; Platelets 392; Potassium 4.4; Sodium 139  Recent Lipid Panel    Component Value Date/Time   CHOL 179 03/27/2021 0935   TRIG 69 03/27/2021 0935   HDL 71 03/27/2021 0935   CHOLHDL 2.5 03/27/2021 0935   CHOLHDL 3.6 03/17/2020 1519   VLDL 27 03/17/2020 1519   LDLCALC 95 03/27/2021 0935   LDLDIRECT 86.0 09/19/2014 1049    Physical Exam:    VS:  BP 130/84 (BP Location: Left Arm, Patient Position: Sitting, Cuff Size: Normal)   Pulse 78   Ht '5\' 1"'$  (1.549 m)   Wt 151 lb (68.5 kg)   SpO2 98%   BMI 28.53 kg/m     Wt Readings from Last 3 Encounters:  10/05/21 151 lb (68.5 kg)  09/25/21 150 lb 4 oz (68.2 kg)  09/15/21 165 lb 5.5 oz (75 kg)     GEN:  Well nourished, well developed in no acute distress HEENT: Normal NECK: No JVD; No carotid bruits CARDIAC: Irregular heartbeat, 3/6 systolic murmur RESPIRATORY:  Clear to auscultation without rales, wheezing or rhonchi  ABDOMEN: Soft, non-tender, non-distended MUSCULOSKELETAL: Trace edema; No deformity  SKIN: Warm and dry NEUROLOGIC:  Alert and oriented x 3 PSYCHIATRIC:  Normal affect   ASSESSMENT:    1. Atrial flutter, unspecified type ([place])   2. Primary hypertension   3. Aortic valve stenosis, etiology of cardiac valve disease unspecified     PLAN:    In order of problems listed above:  Atrial flutter, heart rate controlled, largely asymptomatic.  Continue Coreg, Eliquis, EF 55%.  Patient wants to check price for cardioversion before considering which I think is reasonable.  Continue Eliquis as prescribed.  Plan cardioversion when patient is agreeable. Hypertension, BP controlled.  Continue carvedilol, hydralazine, Aldactone 25 mg daily. Mild aortic stenosis, unchanged from echocardiogram 1 year ago.  Continue to monitor serially  Follow-up in 6 months.  Shared Decision Making/Informed Consent The risks (stroke, cardiac arrhythmias rarely resulting in the  need for a temporary or permanent pacemaker, skin irritation or burns and complications associated with conscious sedation including aspiration, arrhythmia, respiratory failure and death), benefits (restoration of normal sinus rhythm) and alternatives of a direct current cardioversion were explained in detail to Ms. Sealey and she agrees to proceed.      Medication Adjustments/Labs and Tests Ordered: Current medicines are reviewed at length with the patient today.  Concerns regarding medicines are outlined above.  Orders Placed This Encounter  Procedures   EKG 12-Lead    No orders of the defined types were placed in this encounter.     Patient Instructions  Medication Instructions:   Your physician recommends that you continue on your current medications as directed. Please refer to the Current Medication list given to you today.  *If you need a refill on your cardiac medications before your next appointment, please call your pharmacy*   Lab Work: None ordered    Testing/Procedures: None ord   Follow-Up: At [facility], you and your health needs are our priority.  As part of our continuing mission to provide you with exceptional heart care, we have created designated Provider Care Teams.  These Care Teams include your primary Cardiologist (physician) and Advanced Practice Providers (APPs -  Physician Assistants and Nurse Practitioners) who all work together to provide you with the care you need, when you need it.  We recommend signing up for the patient portal called "MyChart".  Sign up information is provided on this After Visit Summary.  MyChart is used to connect with patients for Virtual Visits (Telemedicine).  Patients are able to view lab/test results, encounter notes, upcoming appointments, etc.  Non-urgent messages can be sent to your provider as well.   To learn more about what you can do with MyChart, go to [email or web address].    Your next appointment:   6  month(s)  The format for your next appointment:   In Person  Provider:   You may see Kate Sable, MD or one of the following Advanced Practice Providers on your designated Care Team:   Murray Hodgkins, NP Christell Faith, PA-C Cadence Kathlen Mody, [place]    Other Instructions   Important Information About Sugar         Signed, Kate Sable, MD  10/05/2021 3:41 PM    [place]

## 2021-10-05 NOTE — Patient Instructions (Signed)
Medication Instructions:   Your physician recommends that you continue on your current medications as directed. Please refer to the Current Medication list given to you today.  *If you need a refill on your cardiac medications before your next appointment, please call your pharmacy*   Lab Work: None ordered    Testing/Procedures: None ord   Follow-Up: At [facility], you and your health needs are our priority.  As part of our continuing mission to provide you with exceptional heart care, we have created designated Provider Care Teams.  These Care Teams include your primary Cardiologist (physician) and Advanced Practice Providers (APPs -  Physician Assistants and Nurse Practitioners) who all work together to provide you with the care you need, when you need it.  We recommend signing up for the patient portal called "MyChart".  Sign up information is provided on this After Visit Summary.  MyChart is used to connect with patients for Virtual Visits (Telemedicine).  Patients are able to view lab/test results, encounter notes, upcoming appointments, etc.  Non-urgent messages can be sent to your provider as well.   To learn more about what you can do with MyChart, go to [email or web address].    Your next appointment:   6 month(s)  The format for your next appointment:   In Person  Provider:   You may see Kate Sable, MD or one of the following Advanced Practice Providers on your designated Care Team:   Murray Hodgkins, NP Christell Faith, PA-C Cadence Kathlen Mody, [place]    Other Instructions   Important Information About Sugar

## 2021-10-06 ENCOUNTER — Telehealth: Payer: Self-pay | Admitting: Family Medicine

## 2021-10-06 DIAGNOSIS — A0471 Enterocolitis due to Clostridium difficile, recurrent: Secondary | ICD-10-CM

## 2021-10-06 DIAGNOSIS — R197 Diarrhea, unspecified: Secondary | ICD-10-CM

## 2021-10-06 NOTE — Telephone Encounter (Signed)
Is she having watery diarrhea? Ok to come pick up C diff test if so. Ordered.

## 2021-10-06 NOTE — Telephone Encounter (Signed)
Patient's daughter is concerned that mother has cdiff. Patient has had diarrhea since Sunday and she is worried that she may have it.Wants to know if she can pick up a fecal kit to be prepared for mother's visit on 06/16/

## 2021-10-06 NOTE — Telephone Encounter (Signed)
Spoke to patient by telephone and was advised that she is having watery diarrhea. Patient stated that she will have her daughter come by tomorrow morning to pick the test kit up.

## 2021-10-06 NOTE — Telephone Encounter (Signed)
Left message on voicemail to call the office back. 

## 2021-10-07 ENCOUNTER — Encounter: Payer: Self-pay | Admitting: Family Medicine

## 2021-10-08 ENCOUNTER — Telehealth: Payer: Self-pay

## 2021-10-08 NOTE — Telephone Encounter (Signed)
Called patient and informed her of her cost for the DCCV. She was very appreciative for the call. She stated that she would talk to her daughter and they would reach back out to me about scheduling the procedure.

## 2021-10-08 NOTE — Telephone Encounter (Signed)
-----   Message from Alta Corning sent at 10/07/2021  4:14 PM EDT ----- Regarding: RE: DCCV Cost No problem Paxton Binns. [employer] has advised that her copayment amount for a cardioversion would be $220 copayment (ref number#[id number]).  Thank you, ----- Message ----- From: Kavin Leech, RN Sent: 10/06/2021   9:08 AM EDT To: Alta Corning Subject: FW: DCCV Cost                                  I am so sorry!  Thank you!  ----- Message ----- From: Alta Corning Sent: 10/06/2021   8:38 AM EDT To: Kavin Leech, RN Subject: RE: DCCV Cost                                  Good Morning Omaree [place],  Please include the patient information. No patient is attached to this message.   Thank you, ----- Message ----- From: Kavin Leech, RN Sent: 10/05/2021   3:26 PM EDT To: Alta Corning; Kavin Leech, RN Subject: [place],  Would you be able to find out how much a Cardioversion would cost for this patient? They are requesting the cost before deciding to proceed.  Thank you!   Ysmael Hires Fajardo RN

## 2021-10-09 ENCOUNTER — Encounter: Payer: Self-pay | Admitting: Family Medicine

## 2021-10-09 ENCOUNTER — Ambulatory Visit (INDEPENDENT_AMBULATORY_CARE_PROVIDER_SITE_OTHER): Payer: PPO | Admitting: Family Medicine

## 2021-10-09 VITALS — BP 118/78 | HR 85 | Temp 98.2°F | Ht 61.0 in | Wt 151.5 lb

## 2021-10-09 DIAGNOSIS — I10 Essential (primary) hypertension: Secondary | ICD-10-CM

## 2021-10-09 DIAGNOSIS — E039 Hypothyroidism, unspecified: Secondary | ICD-10-CM | POA: Diagnosis not present

## 2021-10-09 DIAGNOSIS — L299 Pruritus, unspecified: Secondary | ICD-10-CM | POA: Diagnosis not present

## 2021-10-09 DIAGNOSIS — I4819 Other persistent atrial fibrillation: Secondary | ICD-10-CM

## 2021-10-09 LAB — POC URINALSYSI DIPSTICK (AUTOMATED)
Bilirubin, UA: NEGATIVE
Blood, UA: NEGATIVE
Glucose, UA: NEGATIVE
Ketones, UA: NEGATIVE
Leukocytes, UA: NEGATIVE
Nitrite, UA: NEGATIVE
Protein, UA: NEGATIVE
Spec Grav, UA: 1.015 (ref 1.010–1.025)
Urobilinogen, UA: 0.2 E.U./dL
pH, UA: 6 (ref 5.0–8.0)

## 2021-10-09 MED ORDER — LEVOTHYROXINE SODIUM 125 MCG PO TABS: 125.0000 ug | ORAL_TABLET | Freq: Every day | ORAL | 1 refills | Status: DC

## 2021-10-09 MED ORDER — SYNTHROID 125 MCG PO TABS: 125.0000 ug | ORAL_TABLET | Freq: Every day | ORAL | 1 refills | Status: DC

## 2021-10-09 MED ORDER — PREDNISONE 20 MG PO TABS: ORAL_TABLET | ORAL | 0 refills | Status: DC

## 2021-10-09 MED ORDER — CARVEDILOL 6.25 MG PO TABS: 6.2500 mg | ORAL_TABLET | Freq: Two times a day (BID) | ORAL | 1 refills | Status: DC

## 2021-10-09 NOTE — Assessment & Plan Note (Signed)
Blood pressure actually well controlled on current regimen-continue.

## 2021-10-09 NOTE — Addendum Note (Signed)
Addended by: Brenton Grills on: 5/68/6168 37:29 PM   Modules accepted: Orders

## 2021-10-09 NOTE — Progress Notes (Signed)
Patient ID: Brooke Beasley, female    DOB: 1930-10-20, 88 y.o.   MRN: [id number]  This visit was conducted in person.  BP 118/78   Pulse 85   Temp 98.2 F (36.8 C) (Temporal)   Ht _0  (1.549 m)   Wt 151 lb 8 oz (68.7 kg)   SpO2 98%   BMI 28.63 kg/m    CC: 6 mo f/u visit  Subjective:   HPI: Brooke Beasley is a 88 y.o. female presenting on 10/09/2021 for Follow-up (Here for 6 mo f/u.  Requests 90-day rx for Synthroid. Pt accompanied by daughter, Brooke Beasley. )   Pruritis - ongoing despite gabapentin, and more recently trial hydroxyzine. It is an itch that rashes. Itch is worse at night time. No new joint pains, skin rash, significant dry mouth/eyes. Known periph neuropathy "a little worse".  No new lotions, detergents, soaps or shampoos.  Now notes some symptoms of urinary retention.  Also notes darker urine with foul odor but not dysuria,  Uses pure wick at night time.   Watery diarrhea x12 started 10/04/2021. H/o recurrent C diff, did receive C diff ppx abx while on prolonged antibiotic course for proteus/citrobacter disciitis - latest competed oral augmentin course. Sent home with stool C diff test - but watery diarrhea has since resolved so she did not complete.   Atrial flutter established with cardiology on eliquis, coreg, considering DCCV - seen earlier this week. HTN managed on carvedilol, hydralazine, and aldactone 43m daily.   Renal artery [place](09/2021) showed 1-59% R renal artery stenosis, unable to visualize left side.      Relevant past medical, surgical, family and social history reviewed and updated as indicated. Interim medical history since our last visit reviewed. Allergies and medications reviewed and updated. Outpatient Medications Prior to Visit  Medication Sig Dispense Refill   acetaminophen (TYLENOL) 500 MG tablet Take 500-1,000 mg by mouth every 6 (six) hours as needed for moderate pain. Depends on pain     albuterol (PROVENTIL HFA;VENTOLIN  HFA) 108 (90 Base) MCG/ACT inhaler Inhale 2 puffs into the lungs every 6 (six) hours as needed for wheezing or shortness of breath. 1 Inhaler 0   ALPHAGAN P 0.1 % SOLN Place 1 drop into both eyes daily.     apixaban (ELIQUIS) 5 MG TABS tablet Take 1 tablet (5 mg total) by mouth 2 (two) times daily. 60 tablet 2   bimatoprost (LUMIGAN) 0.01 % SOLN Place 1 drop into both eyes at bedtime.     bisacodyl (DULCOLAX) 5 MG EC tablet Take 1 tablet (5 mg total) by mouth daily as needed for moderate constipation. 30 tablet 1   cholecalciferol (VITAMIN D) 1000 units tablet Take 1,000 Units by mouth daily.     clonazePAM (KLONOPIN) 0.5 MG tablet TAKE 1 TABLET BY MOUTH AT BEDTIME 30 tablet 0   colchicine 0.6 MG tablet Take 1 tablet (0.6 mg total) by mouth daily as needed (gout flare). 90 tablet 0   diclofenac Sodium (VOLTAREN) 1 % GEL Apply 1 application  topically 3 (three) times daily as needed.     Famotidine-Ca Carb-Mag Hydrox (PEPCID COMPLETE PO) Take 1 tablet by mouth daily as needed (indigestion).     ferrous sulfate 325 (65 FE) MG tablet Take 1 tablet (325 mg total) by mouth 2 (two) times daily with a meal. 60 tablet 3   furosemide (LASIX) 20 MG tablet Take 1 tablet (20 mg total) by mouth daily. 30 tablet 6  gabapentin (NEURONTIN) 300 MG capsule Take 1 capsule (300 mg total) by mouth daily as needed (nerve pain, itching). 30 capsule 3   hydrALAZINE (APRESOLINE) 25 MG tablet Take 1 tablet (25 mg total) by mouth every 8 (eight) hours. 90 tablet 0   hydrocortisone 2.5 % cream Place 1 application rectally daily as needed (hemorrhoids).     Infant Care Products [facility]) OINT Apply 1 application topically 2 (two) times daily as needed. 430 g 0   Iron-FA-B Cmp-C-Biot-Probiotic (FUSION PLUS) CAPS Take 1 capsule by mouth once daily 30 capsule 2   loperamide (IMODIUM) 2 MG capsule Take 1 capsule (2 mg total) by mouth 3 (three) times daily as needed for diarrhea or loose stools. 30 capsule 0   Misc Natural  Products (OSTEO BI-FLEX TRIPLE STRENGTH PO) Take 1 tablet by mouth daily.     Multiple Vitamin (MULTIVITAMIN ADULT PO) Take by mouth daily.     omeprazole (PRILOSEC) 20 MG capsule Take 20 mg by mouth daily as needed (heartburn).     ondansetron (ZOFRAN-ODT) 4 MG disintegrating tablet Take 1 tablet (4 mg total) by mouth every 8 (eight) hours as needed for nausea or vomiting. 20 tablet 0   polyethylene glycol (MIRALAX / GLYCOLAX) 17 g packet Take 17 g by mouth daily.     Potassium 99 MG TABS Take 1 tablet (99 mg total) by mouth every Monday, Wednesday, and Friday. 330 tablet    Simethicone 180 MG CAPS Take 1 capsule by mouth daily as needed (indigestion).     spironolactone (ALDACTONE) 25 MG tablet Take 1 tablet (25 mg total) by mouth daily. 90 tablet 3   carvedilol (COREG) 6.25 MG tablet Take 1 tablet (6.25 mg total) by mouth 2 (two) times daily with a meal. 60 tablet 0   hydrOXYzine (ATARAX) 25 MG tablet Take 0.5-1 tablets (12.5-25 mg total) by mouth 2 (two) times daily as needed for itching. 30 tablet 0   levothyroxine (SYNTHROID) 125 MCG tablet Take 1 tablet (125 mcg total) by mouth daily. 30 tablet 6   No facility-administered medications prior to visit.     Per HPI unless specifically indicated in ROS section below Review of Systems  Objective:  BP 118/78   Pulse 85   Temp 98.2 F (36.8 C) (Temporal)   Ht _0  (1.549 m)   Wt 151 lb 8 oz (68.7 kg)   SpO2 98%   BMI 28.63 kg/m   Wt Readings from Last 3 Encounters:  10/09/21 151 lb 8 oz (68.7 kg)  10/05/21 151 lb (68.5 kg)  09/25/21 150 lb 4 oz (68.2 kg)      Physical Exam Vitals and nursing note reviewed.  Constitutional:      Appearance: Normal appearance. She is not ill-appearing.  Cardiovascular:     Rate and Rhythm: Normal rate and regular rhythm.     Pulses: Normal pulses.     Heart sounds: Murmur (3/6 systolic) heard.  Pulmonary:     Effort: Pulmonary effort is normal. No respiratory distress.     Breath sounds:  Normal breath sounds. No wheezing, rhonchi or rales.  Musculoskeletal:     Right lower leg: No edema.     Left lower leg: No edema.  Skin:    General: Skin is warm and dry.     Findings: No rash.     Comments: Excoriations to forearms from itching  Neurological:     Mental Status: She is alert.  Psychiatric:  Mood and Affect: Mood normal.        Behavior: Behavior normal.       Results for orders placed or performed in visit on 09/25/21  CBC with Differential/Platelet  Result Value Ref Range   WBC 7.4 3.8 - 10.8 Thousand/uL   RBC 4.55 3.80 - 5.10 Million/uL   Hemoglobin 13.4 11.7 - 15.5 g/dL   HCT 41.6 35.0 - 45.0 %   MCV 91.4 80.0 - 100.0 fL   MCH 29.5 27.0 - 33.0 pg   MCHC 32.2 32.0 - 36.0 g/dL   RDW 16.7 (H) 11.0 - 15.0 %   Platelets 392 140 - 400 Thousand/uL   MPV 9.2 7.5 - 12.5 fL   Neutro Abs 3,241 1,500 - 7,800 cells/uL   Lymphs Abs 2,612 850 - 3,900 cells/uL   Absolute Monocytes 992 (H) 200 - 950 cells/uL   Eosinophils Absolute 518 (H) 15 - 500 cells/uL   Basophils Absolute 37 0 - 200 cells/uL   Neutrophils Relative % 43.8 %   Total Lymphocyte 35.3 %   Monocytes Relative 13.4 %   Eosinophils Relative 7.0 %   Basophils Relative 0.5 %  Comprehensive metabolic panel  Result Value Ref Range   Glucose, Bld 81 65 - 99 mg/dL   BUN 24 7 - 25 mg/dL   Creat 1.05 (H) 0.60 - 0.95 mg/dL   BUN/Creatinine Ratio 23 (H) 6 - 22 (calc)   Sodium 139 135 - 146 mmol/L   Potassium 4.4 3.5 - 5.3 mmol/L   Chloride 97 (L) 98 - 110 mmol/L   CO2 32 20 - 32 mmol/L   Calcium 10.8 (H) 8.6 - 10.4 mg/dL   Total Protein 7.4 6.1 - 8.1 g/dL   Albumin 4.1 3.6 - 5.1 g/dL   Globulin 3.3 1.9 - 3.7 g/dL (calc)   AG Ratio 1.2 1.0 - 2.5 (calc)   Total Bilirubin 0.6 0.2 - 1.2 mg/dL   Alkaline phosphatase (APISO) 101 37 - 153 U/L   AST 37 (H) 10 - 35 U/L   ALT 32 (H) 6 - 29 U/L   Lab Results  Component Value Date   TSH 18.420 (H) 09/07/2021    Assessment & Plan:   Problem List Items  Addressed This Visit     Hypothyroidism    Levothyroxine dose was increased to 125 mcg during hospitalization due to elevated TSH. It is a little too soon to recheck thyroid function today-but will recommend checking at next visit. Thyroid replacement was changed from brand Synthroid to generic levothyroxine, unclear why, but it may have correlated temporally to when pruritus worsen.  I have prescribed brand Synthroid for her to start taking again.  Update with effect on pruritus.      Relevant Medications   carvedilol (COREG) 6.25 MG tablet   SYNTHROID 125 MCG tablet   Hypertension, essential    Blood pressure actually well controlled on current regimen-continue.      Relevant Medications   carvedilol (COREG) 6.25 MG tablet   Pruritus - Primary    Chronic, severe, worsening, predominant concern today.  Hydroxyzine was not effective, may be worsening urinary retention so advised to stop this.  We will continue gabapentin but I asked her to take at nighttime as she has been taking in the morning. I will also change her levothyroxine to brand Synthroid as recent change to generic may have contributed to recurrent pruritus. I have asked her to start oral antihistamine +/- pepcid.  We will also Rx short  prednisone taper for temporary relief.  Reviewed topical treatments including regular moisturizer (they are using CeraVe), limiting hot showers, and oatmeal bath. If ongoing struggle, will recommend she return for lab work to include ESR, ANA, thyroid functions.  We will also consider trial doxepin, mirtazapine or naltrexone in addition to singulair.      Persistent atrial fibrillation [facility])    Appreciate cardiology care, discussing DC cardioversion.      Relevant Medications   carvedilol (COREG) 6.25 MG tablet     Meds ordered this encounter  Medications   carvedilol (COREG) 6.25 MG tablet    Sig: Take 1 tablet (6.25 mg total) by mouth 2 (two) times daily with a meal.    Dispense:   180 tablet    Refill:  1   DISCONTD: levothyroxine (SYNTHROID) 125 MCG tablet    Sig: Take 1 tablet (125 mcg total) by mouth daily.    Dispense:  90 tablet    Refill:  1   SYNTHROID 125 MCG tablet    Sig: Take 1 tablet (125 mcg total) by mouth daily before breakfast.    Dispense:  90 tablet    Refill:  1    Use brand due to possible itching from generic   predniSONE (DELTASONE) 20 MG tablet    Sig: Take two tablets daily for 4 days followed by one tablet daily for 4 days    Dispense:  12 tablet    Refill:  0   No orders of the defined types were placed in this encounter.    Patient Instructions  Stop hydroxyzine as it may be contributing to symptoms of urinary retention.  Prednisone course sent to pharmacy - temporary taper for itching. Urinalysis today.  Switch gabapentin to night time with dinner.  Continue cerave moisturizing cream , try oatmeal bath and/or calomine lotion, use lukewarm water (not hot).  Start brand Synthroid 131mg daily and let me know effect on itching.   Follow up plan: Return if symptoms worsen or fail to improve.  JRia Bush MD

## 2021-10-09 NOTE — Assessment & Plan Note (Signed)
Appreciate cardiology care, discussing DC cardioversion.

## 2021-10-09 NOTE — Assessment & Plan Note (Addendum)
Chronic, severe, worsening, predominant concern today.  Hydroxyzine was not effective, may be worsening urinary retention so advised to stop this.  We will continue gabapentin but I asked her to take at nighttime as she has been taking in the morning. I will also change her levothyroxine to brand Synthroid as recent change to generic may have contributed to recurrent pruritus. I have asked her to start oral antihistamine +/- pepcid.  We will also Rx short prednisone taper for temporary relief.  Reviewed topical treatments including regular moisturizer (they are using CeraVe), limiting hot showers, and oatmeal bath. If ongoing struggle, will recommend she return for lab work to include ESR, ANA, thyroid functions.  We will also consider trial doxepin, mirtazapine or naltrexone in addition to singulair. 

## 2021-10-09 NOTE — Telephone Encounter (Signed)
Seen today. 

## 2021-10-09 NOTE — Patient Instructions (Addendum)
Stop hydroxyzine as it may be contributing to symptoms of urinary retention.  Prednisone course sent to pharmacy - temporary taper for itching. Urinalysis today.  Switch gabapentin to night time with dinner.  Continue cerave moisturizing cream , try oatmeal bath and/or calomine lotion, use lukewarm water (not hot).  Start brand Synthroid 186mg daily and let me know effect on itching.

## 2021-10-09 NOTE — Assessment & Plan Note (Addendum)
Levothyroxine dose was increased to 125 mcg during hospitalization due to elevated TSH. It is a little too soon to recheck thyroid function today-but will recommend checking at next visit. Thyroid replacement was changed from brand Synthroid to generic levothyroxine, unclear why, but it may have correlated temporally to when pruritus worsen.  I have prescribed brand Synthroid for her to start taking again.  Update with effect on pruritus.

## 2021-10-14 ENCOUNTER — Encounter: Payer: Self-pay | Admitting: Family Medicine

## 2021-10-14 DIAGNOSIS — E039 Hypothyroidism, unspecified: Secondary | ICD-10-CM

## 2021-10-14 DIAGNOSIS — D508 Other iron deficiency anemias: Secondary | ICD-10-CM

## 2021-10-14 DIAGNOSIS — L299 Pruritus, unspecified: Secondary | ICD-10-CM

## 2021-10-18 DIAGNOSIS — I5032 Chronic diastolic (congestive) heart failure: Secondary | ICD-10-CM | POA: Diagnosis not present

## 2021-10-20 ENCOUNTER — Encounter: Payer: Self-pay | Admitting: Internal Medicine

## 2021-10-23 DIAGNOSIS — M19011 Primary osteoarthritis, right shoulder: Secondary | ICD-10-CM | POA: Diagnosis not present

## 2021-10-23 DIAGNOSIS — M11211 Other chondrocalcinosis, right shoulder: Secondary | ICD-10-CM | POA: Diagnosis not present

## 2021-10-24 ENCOUNTER — Encounter: Payer: Self-pay | Admitting: Family Medicine

## 2021-10-24 ENCOUNTER — Other Ambulatory Visit: Payer: Self-pay | Admitting: Family Medicine

## 2021-10-25 MED ORDER — NALTREXONE HCL 50 MG PO TABS: 50.0000 mg | ORAL_TABLET | Freq: Every day | ORAL | 0 refills | Status: DC

## 2021-10-25 MED ORDER — MONTELUKAST SODIUM 10 MG PO TABS: 10.0000 mg | ORAL_TABLET | Freq: Every day | ORAL | 0 refills | Status: DC

## 2021-10-26 NOTE — Telephone Encounter (Signed)
Name of Medication: Clonazepam Name of Pharmacy: [facility] or Written Date and Quantity: 09/22/21, #30 Last Office Visit and Type: 10/09/21, hosp f/u; 6 mo f/u Next Office Visit and Type: 01/01/22, 3 mo f/u Last Controlled Substance Agreement Date: 09/26/14 Last UDS: 09/26/14

## 2021-10-28 ENCOUNTER — Other Ambulatory Visit: Payer: Self-pay

## 2021-10-28 ENCOUNTER — Encounter: Payer: Self-pay | Admitting: Internal Medicine

## 2021-10-28 ENCOUNTER — Ambulatory Visit: Payer: PPO | Admitting: Internal Medicine

## 2021-10-28 VITALS — BP 133/79 | HR 87 | Temp 98.0°F | Wt 147.0 lb

## 2021-10-28 DIAGNOSIS — M4647 Discitis, unspecified, lumbosacral region: Secondary | ICD-10-CM | POA: Diagnosis not present

## 2021-10-28 DIAGNOSIS — D721 Eosinophilia, unspecified: Secondary | ICD-10-CM | POA: Diagnosis not present

## 2021-10-28 DIAGNOSIS — L299 Pruritus, unspecified: Secondary | ICD-10-CM | POA: Diagnosis not present

## 2021-10-28 NOTE — Assessment & Plan Note (Signed)
  I am unsure the cause of her pruritis and her PCP is currently trying to manage this.  She has had some intermittent  and mild eosinophilia that I am unsure if this is contributing as her pruritis preceded this lab abnormality.  Will check CBC with diff today and Strongyloides Ab.  Will refer to allergy for evaluation as well.

## 2021-10-28 NOTE — Progress Notes (Signed)
[facility]  CHIEF COMPLAINT:    Follow up for intra-abdominal abscess and spinal infection  SUBJECTIVE:    Brooke Beasley is a 88 y.o. female with PMHx as below who presents to the clinic for spinal infection and intra-abdominal abscess.   She is here today for routine follow up after last visit on 09/03/21.  Shortly after that visit she was admitted to the hospital for hyponatremia.  During that admission she completed her course of antibiotics.  Augmentin was discontinued on 09/14/21 as planned and then oral vancomycin for C diff prevention was completed on 09/21/21.  She was seen last week for shoulder pain suspected to be a pseudogout flare up.  Her primary concern today is persistent itching that has been refractory thus far to treatment.  She has seen dermatology for this issue as well as her PCP.    Please see A&P for the details of today's visit and status of the patient's medical problems.   Patient's Medications  New Prescriptions   No medications on file  Previous Medications   ACETAMINOPHEN (TYLENOL) 500 MG TABLET    Take 500-1,000 mg by mouth every 6 (six) hours as needed for moderate pain. Depends on pain   ALBUTEROL (PROVENTIL HFA;VENTOLIN HFA) 108 (90 BASE) MCG/ACT INHALER    Inhale 2 puffs into the lungs every 6 (six) hours as needed for wheezing or shortness of breath.   ALPHAGAN P 0.1 % SOLN    Place 1 drop into both eyes daily.   APIXABAN (ELIQUIS) 5 MG TABS TABLET    Take 1 tablet (5 mg total) by mouth 2 (two) times daily.   BIMATOPROST (LUMIGAN) 0.01 % SOLN    Place 1 drop into both eyes at bedtime.   BISACODYL (DULCOLAX) 5 MG EC TABLET    Take 1 tablet (5 mg total) by mouth daily as needed for moderate constipation.   CARVEDILOL (COREG) 6.25 MG TABLET    Take 1 tablet (6.25 mg total) by mouth 2 (two) times daily with a meal.   CHOLECALCIFEROL (VITAMIN D) 1000 UNITS TABLET    Take 1,000 Units by mouth daily.   CLONAZEPAM  (KLONOPIN) 0.5 MG TABLET    TAKE 1 TABLET BY MOUTH AT BEDTIME   COLCHICINE 0.6 MG TABLET    Take 1 tablet (0.6 mg total) by mouth daily as needed (gout flare).   DICLOFENAC SODIUM (VOLTAREN) 1 % GEL    Apply 1 application  topically 3 (three) times daily as needed.   FAMOTIDINE-CA CARB-MAG HYDROX (PEPCID COMPLETE PO)    Take 1 tablet by mouth daily as needed (indigestion).   FERROUS SULFATE 325 (65 FE) MG TABLET    Take 1 tablet (325 mg total) by mouth 2 (two) times daily with a meal.   FUROSEMIDE (LASIX) 20 MG TABLET    Take 1 tablet (20 mg total) by mouth daily.   GABAPENTIN (NEURONTIN) 300 MG CAPSULE    Take 1 capsule (300 mg total) by mouth daily as needed (nerve pain, itching).   HYDRALAZINE (APRESOLINE) 25 MG TABLET    Take 1 tablet (25 mg total) by mouth every 8 (eight) hours.   HYDROCORTISONE 2.5 % CREAM    Place 1 application rectally daily as needed (hemorrhoids).   INFANT CARE PRODUCTS (DERMACLOUD) OINT    Apply 1 application topically 2 (two) times daily as needed.   IRON-FA-B CMP-C-BIOT-PROBIOTIC (FUSION PLUS) CAPS    Take 1 capsule by  mouth once daily   LOPERAMIDE (IMODIUM) 2 MG CAPSULE    Take 1 capsule (2 mg total) by mouth 3 (three) times daily as needed for diarrhea or loose stools.   MISC NATURAL PRODUCTS (OSTEO BI-FLEX TRIPLE STRENGTH PO)    Take 1 tablet by mouth daily.   MONTELUKAST (SINGULAIR) 10 MG TABLET    Take 1 tablet (10 mg total) by mouth at bedtime.   MULTIPLE VITAMIN (MULTIVITAMIN ADULT PO)    Take by mouth daily.   NALTREXONE (DEPADE) 50 MG TABLET    Take 1 tablet (50 mg total) by mouth daily.   OMEPRAZOLE (PRILOSEC) 20 MG CAPSULE    Take 20 mg by mouth daily as needed (heartburn).   ONDANSETRON (ZOFRAN-ODT) 4 MG DISINTEGRATING TABLET    Take 1 tablet (4 mg total) by mouth every 8 (eight) hours as needed for nausea or vomiting.   POLYETHYLENE GLYCOL (MIRALAX / GLYCOLAX) 17 G PACKET    Take 17 g by mouth daily.   POTASSIUM 99 MG TABS    Take 1 tablet (99 mg total)  by mouth every Monday, Wednesday, and Friday.   SIMETHICONE 180 MG CAPS    Take 1 capsule by mouth daily as needed (indigestion).   SPIRONOLACTONE (ALDACTONE) 25 MG TABLET    Take 1 tablet (25 mg total) by mouth daily.   SYNTHROID 125 MCG TABLET    Take 1 tablet (125 mcg total) by mouth daily before breakfast.  Modified Medications   No medications on file  Discontinued Medications   No medications on file      Past Medical History:  Diagnosis Date   Anxiety    C. difficile diarrhea 07/11/2018   S/p multiple oral vanc treatments (course and tapers) and completed Zinplava monoclonal Ab treatment (05/10/2019) Fecal transplant on hold during COVID pandemic   CAP (community acquired pneumonia) 12/22/2017   Carotid stenosis 10/18/2014   R 1-39%, L 40-59%, rpt 1 yr (09/2014)    CKD (chronic kidney disease) stage 3, GFR 30-59 ml/min (HCC) 08/02/2014   Closed fracture of right orbit ([place]) 02/10/2021   Clostridioides difficile infection    hx 2020   COVID-19 virus infection 10/13/2020   Degenerative disc disease    LS   Depression    nervous breakdonw in 1964-out of work for a year   Diverticulosis    severe by colonoscopy   Family history of adverse reaction to anesthesia    n/v   GERD (gastroesophageal reflux disease)    Glaucoma    Heart murmur 08/2011   mitral regurge - on echo    History of hiatal hernia    History of shingles    HTN (hypertension)    Hyperlipidemia    Hypertension    Hypothyroidism    IBS 11/02/2006   Intestinal bacterial overgrowth    In small colon   Left shoulder pain 11/04/2014   Lower GI bleed 12/2020   thought diverticular complicated by ABLA with syncope and orbital fracture s/p hospitalization   Maxillary fracture ([place]) 04/08/2012   Osteoarthritis 2016   [facility])   Osteoporosis    dexa 2011   Perirectal abscess 08/03/2021   CT guided aspiration of abscess grow Proteums mirabilis and Citrobacter koseri 07/2021, currently receiving IV Zosyn  planned total 6 wks.   Pseudogout 2016   shoulders ([place])    Social History   Tobacco Use   Smoking status: Never   Smokeless tobacco: Never  Vaping Use   Vaping Use: Never  used  Substance Use Topics   Alcohol use: No   Drug use: No    Family History  Problem Relation Age of Onset   Diabetes Brother        post-op   Coronary artery disease Brother    Diabetes Paternal Grandfather    Breast cancer Cousin     Allergies  Allergen Reactions   Amlodipine Other (See Comments)    Stomach pains--"feel like my insides are on fire"   Ace Inhibitors Cough   Carvedilol Other (See Comments)    fatigue   Chlorthalidone Other (See Comments)    Severe hyponatremia   Losartan Other (See Comments)    Cr bumped   Other Other (See Comments)    No seeds or corn because of IBS   Paroxetine Hcl Other (See Comments)    Not effective - felt ill on this medicine   Risedronate Sodium     REACTION: joint pain   Simvastatin     REACTION: the full 20 mg pill causes leg pain- can tol 10 mg   Toprol Xl [Metoprolol] Diarrhea    Diarrhea and weakness   Alendronate Sodium Palpitations   Influenza Vaccines Rash and Other (See Comments)    Led to mental breakdown in 1960s    Silenor [Doxepin Hcl] Palpitations    Hallucinations and racing heart   Sulfonamide Derivatives Rash    Review of Systems  Constitutional: Negative.   Gastrointestinal:  Positive for abdominal pain, diarrhea and nausea.  Musculoskeletal:  Positive for back pain.  Skin:  Positive for itching.  All other systems reviewed and are negative.    OBJECTIVE:    Vitals:   10/28/21 1554  BP: 133/79  Pulse: 87  Temp: 98 F (36.7 C)  TempSrc: Temporal  Weight: 147 lb (66.7 kg)   Body mass index is 27.78 kg/m.  Physical Exam Constitutional:      General: She is not in acute distress.    Appearance: Normal appearance.     Comments: She is here today with her daughter and ambulating with a cane.   HENT:      Head: Normocephalic and atraumatic.  Eyes:     Extraocular Movements: Extraocular movements intact.     Conjunctiva/sclera: Conjunctivae normal.  Pulmonary:     Effort: Pulmonary effort is normal. No respiratory distress.  Abdominal:     General: There is no distension.     Palpations: Abdomen is soft.  Musculoskeletal:     Cervical back: Normal range of motion and neck supple.     Right lower leg: No edema.     Left lower leg: No edema.  Skin:    General: Skin is warm and dry.  Neurological:     General: No focal deficit present.     Mental Status: She is alert and oriented to person, place, and time.  Psychiatric:        Mood and Affect: Mood normal.        Behavior: Behavior normal.      Labs and Microbiology:    Latest Ref Rng & Units 09/25/2021    3:51 PM 09/13/2021   12:52 AM 09/08/2021   12:59 AM  CBC  WBC 3.8 - 10.8 Thousand/uL 7.4  10.5  9.0   Hemoglobin 11.7 - 15.5 g/dL 13.4  12.1  13.1   Hematocrit 35.0 - 45.0 % 41.6  36.9  37.4   Platelets 140 - 400 Thousand/uL 392  283  318  Latest Ref Rng & Units 09/25/2021    3:51 PM 09/17/2021    6:39 AM 09/16/2021    2:16 AM  CMP  Glucose 65 - 99 mg/dL 81  114  119   BUN 7 - 25 mg/dL '24  17  16   '$ Creatinine 0.60 - 0.95 mg/dL 1.05  0.89  0.82   Sodium 135 - 146 mmol/L 139  137  134   Potassium 3.5 - 5.3 mmol/L 4.4  3.9  3.7   Chloride 98 - 110 mmol/L 97  97  100   CO2 20 - 32 mmol/L 32  31  28   Calcium 8.6 - 10.4 mg/dL 10.8  9.6  9.2   Total Protein 6.1 - 8.1 g/dL 7.4   5.9   Total Bilirubin 0.2 - 1.2 mg/dL 0.6   0.2   Alkaline Phos 38 - 126 U/L   85   AST 10 - 35 U/L 37   22   ALT 6 - 29 U/L 32   18      ASSESSMENT & PLAN:    Discitis of lumbosacral region She has completed prolonged antibiotic course for discitis/OM and remains off antibiotics at this time.  No further therapy needed for now.  Follow up as needed.  Pruritus  I am unsure the cause of her pruritis and her PCP is currently trying to manage  this.  She has had some intermittent  and mild eosinophilia that I am unsure if this is contributing as her pruritis preceded this lab abnormality.  Will check CBC with diff today and Strongyloides Ab.  Will refer to allergy for evaluation as well.     Orders Placed This Encounter  Procedures   CBC w/Diff   Strongyloides antibody   Ambulatory referral to Allergy    Referral Priority:   Routine    Referral Type:   Allergy Testing    Referral Reason:   Specialty Services Required    Requested Specialty:   Allergy    Number of Visits Requested:   [facility] 10/28/2021, 4:51 PM

## 2021-10-28 NOTE — Assessment & Plan Note (Signed)
She has completed prolonged antibiotic course for discitis/OM and remains off antibiotics at this time.  No further therapy needed for now.  Follow up as needed.

## 2021-10-28 NOTE — Telephone Encounter (Signed)
ERx 

## 2021-11-02 ENCOUNTER — Other Ambulatory Visit: Payer: Self-pay | Admitting: Family Medicine

## 2021-11-02 LAB — CBC WITH DIFFERENTIAL/PLATELET
Absolute Monocytes: 757 cells/uL (ref 200–950)
Basophils Absolute: 18 cells/uL (ref 0–200)
Basophils Relative: 0.2 %
Eosinophils Absolute: 123 cells/uL (ref 15–500)
Eosinophils Relative: 1.4 %
HCT: 43.9 % (ref 35.0–45.0)
Hemoglobin: 14.4 g/dL (ref 11.7–15.5)
Lymphs Abs: 1566 cells/uL (ref 850–3900)
MCH: 30.1 pg (ref 27.0–33.0)
MCHC: 32.8 g/dL (ref 32.0–36.0)
MCV: 91.6 fL (ref 80.0–100.0)
MPV: 9.4 fL (ref 7.5–12.5)
Monocytes Relative: 8.6 %
Neutro Abs: 6336 cells/uL (ref 1500–7800)
Neutrophils Relative %: 72 %
Platelets: 254 10*3/uL (ref 140–400)
RBC: 4.79 10*6/uL (ref 3.80–5.10)
RDW: 14.9 % (ref 11.0–15.0)
Total Lymphocyte: 17.8 %
WBC: 8.8 10*3/uL (ref 3.8–10.8)

## 2021-11-02 LAB — STRONGYLOIDES ANTIBODY: Strongyloides IgG Antibody, ELISA: NEGATIVE

## 2021-11-04 ENCOUNTER — Ambulatory Visit: Payer: PPO | Admitting: Gastroenterology

## 2021-11-06 DIAGNOSIS — H353131 Nonexudative age-related macular degeneration, bilateral, early dry stage: Secondary | ICD-10-CM | POA: Diagnosis not present

## 2021-11-06 DIAGNOSIS — H401132 Primary open-angle glaucoma, bilateral, moderate stage: Secondary | ICD-10-CM | POA: Diagnosis not present

## 2021-11-06 DIAGNOSIS — H04123 Dry eye syndrome of bilateral lacrimal glands: Secondary | ICD-10-CM | POA: Diagnosis not present

## 2021-11-06 DIAGNOSIS — H52223 Regular astigmatism, bilateral: Secondary | ICD-10-CM | POA: Diagnosis not present

## 2021-11-11 ENCOUNTER — Telehealth: Payer: Self-pay | Admitting: Cardiology

## 2021-11-11 NOTE — Telephone Encounter (Signed)
Patient's daughter is requesting to schedule cardioversion.

## 2021-11-12 NOTE — Telephone Encounter (Signed)
Called and spoke with patients daughter. Scheduled patient to come in tomorrow as it has been 30 days since we saw her. Patients daughter was grateful for the accomodation.

## 2021-11-13 ENCOUNTER — Other Ambulatory Visit
Admission: RE | Admit: 2021-11-13 | Discharge: 2021-11-13 | Disposition: A | Discharge status: Home / Self Care | Payer: PPO | Attending: Cardiology | Admitting: Cardiology

## 2021-11-13 ENCOUNTER — Encounter: Payer: Self-pay | Admitting: Cardiology

## 2021-11-13 ENCOUNTER — Telehealth: Payer: Self-pay

## 2021-11-13 ENCOUNTER — Ambulatory Visit: Payer: PPO | Admitting: Cardiology

## 2021-11-13 ENCOUNTER — Ambulatory Visit (INDEPENDENT_AMBULATORY_CARE_PROVIDER_SITE_OTHER): Payer: PPO | Admitting: Adult Health

## 2021-11-13 ENCOUNTER — Encounter: Payer: Self-pay | Admitting: Adult Health

## 2021-11-13 VITALS — BP 140/80 | HR 58 | Ht 61.0 in | Wt 146.5 lb

## 2021-11-13 DIAGNOSIS — J9691 Respiratory failure, unspecified with hypoxia: Secondary | ICD-10-CM

## 2021-11-13 DIAGNOSIS — I350 Nonrheumatic aortic (valve) stenosis: Secondary | ICD-10-CM

## 2021-11-13 DIAGNOSIS — I10 Essential (primary) hypertension: Secondary | ICD-10-CM

## 2021-11-13 DIAGNOSIS — I4819 Other persistent atrial fibrillation: Secondary | ICD-10-CM

## 2021-11-13 DIAGNOSIS — I4892 Unspecified atrial flutter: Secondary | ICD-10-CM | POA: Diagnosis not present

## 2021-11-13 DIAGNOSIS — G4733 Obstructive sleep apnea (adult) (pediatric): Secondary | ICD-10-CM

## 2021-11-13 DIAGNOSIS — R0683 Snoring: Secondary | ICD-10-CM

## 2021-11-13 DIAGNOSIS — G4734 Idiopathic sleep related nonobstructive alveolar hypoventilation: Secondary | ICD-10-CM | POA: Diagnosis not present

## 2021-11-13 LAB — CBC
HCT: 45.4 % (ref 36.0–46.0)
Hemoglobin: 14.5 g/dL (ref 12.0–15.0)
MCH: 29.1 pg (ref 26.0–34.0)
MCHC: 31.9 g/dL (ref 30.0–36.0)
MCV: 91.2 fL (ref 80.0–100.0)
Platelets: 245 10*3/uL (ref 150–400)
RBC: 4.98 MIL/uL (ref 3.87–5.11)
RDW: 16.5 % — ABNORMAL HIGH (ref 11.5–15.5)
WBC: 6.5 10*3/uL (ref 4.0–10.5)
nRBC: 0 % (ref 0.0–0.2)

## 2021-11-13 LAB — BASIC METABOLIC PANEL
Anion gap: 7 (ref 5–15)
BUN: 27 mg/dL — ABNORMAL HIGH (ref 8–23)
CO2: 30 mmol/L (ref 22–32)
Calcium: 10.2 mg/dL (ref 8.9–10.3)
Chloride: 101 mmol/L (ref 98–111)
Creatinine, Ser: 1.22 mg/dL — ABNORMAL HIGH (ref 0.44–1.00)
GFR, Estimated: 43 mL/min — ABNORMAL LOW (ref >60)
Glucose, Bld: 109 mg/dL — ABNORMAL HIGH (ref 70–99)
Potassium: 4 mmol/L (ref 3.5–5.1)
Sodium: 138 mmol/L (ref 135–145)

## 2021-11-13 NOTE — Assessment & Plan Note (Signed)
Continue on oxygen with BiPAP at bedtime.  Split-night sleep study is pending.  Plan  Patient Instructions  Continue on BIPAP At bedtime  with oxygen  Set up for Sleep titration study.  Continue on Oxygen 4l/m At bedtime  with BIPAP  Follow up in 6-8 weeks and As needed   Please contact office for sooner follow up if symptoms do not improve or worsen or seek emergency care

## 2021-11-13 NOTE — Telephone Encounter (Signed)
Lm for Andree Coss to see who ordered bipap and if bipap can be set to cpap settings if needed.  Zach's contact number is [phone].

## 2021-11-13 NOTE — Progress Notes (Signed)
Cardiology Office Note:    Date:  11/13/2021   ID:  Brooke Beasley, DOB 05-02-1930, MRN [id number]  PCP:  [name], MD  [facility] Cardiologist:  Kate Sable, MD  [place] Electrophysiologist:  None   Referring MD: [name], MD   Chief Complaint  Patient presents with   Evaluation for DCCV    Patient c/o shortness of breath at times and occasional mild chest pain.  Medications reviewed by the patient verbally.     History of Present Illness:    Brooke Beasley is a 88 y.o. female with a hx of anxiety, persistent A-fib, grade 2 diastolic dysfunction, hypertension, hyperlipidemia, CKD 3, mild aortic stenosis who presents for follow-up.    Previously seen for persistent atrial flutter, DC cardioversion was recommended, patient wanted to be evaluated pricing.  Would like to move forward with DC cardioversion.  Has been taking Eliquis as prescribed, no interruption over the past 4 weeks.  Denies any bleeding issues.  Also takes carvedilol for heart rate control.  Denies palpitations.  Prior notes Echo TTE 07/2021 EF 55 to 60%, moderately dilated LA, moderate MR, posterior mitral leaflet prolapse Echocardiogram on 11/26/2019 showed normal systolic function, EF 60 to 98%, grade 2 diastolic dysfunction.  Mild to moderate MR, mild AS. Renal artery stenosis about 1 to 59% on the right, left renal artery unsuccessful to assess  Patient previously took lisinopril, but stopped due to side effects/cough.  Amlodipine stopped due to abdominal pains, Coreg was stopped because of fatigue.  Past Medical History:  Diagnosis Date   Anxiety    C. difficile diarrhea 07/11/2018   S/p multiple oral vanc treatments (course and tapers) and completed Zinplava monoclonal Ab treatment (05/10/2019) Fecal transplant on hold during COVID pandemic   CAP (community acquired pneumonia) 12/22/2017   Carotid stenosis 10/18/2014   R 1-39%, L 40-59%, rpt 1 yr (09/2014)     CKD (chronic kidney disease) stage 3, GFR 30-59 ml/min (HCC) 08/02/2014   Closed fracture of right orbit ([place]) 02/10/2021   Clostridioides difficile infection    hx 2020   COVID-19 virus infection 10/13/2020   Degenerative disc disease    LS   Depression    nervous breakdonw in 1964-out of work for a year   Diverticulosis    severe by colonoscopy   Family history of adverse reaction to anesthesia    n/v   GERD (gastroesophageal reflux disease)    Glaucoma    Heart murmur 08/2011   mitral regurge - on echo    History of hiatal hernia    History of shingles    HTN (hypertension)    Hyperlipidemia    Hypertension    Hypothyroidism    IBS 11/02/2006   Intestinal bacterial overgrowth    In small colon   Left shoulder pain 11/04/2014   Lower GI bleed 12/2020   thought diverticular complicated by ABLA with syncope and orbital fracture s/p hospitalization   Maxillary fracture ([place]) 04/08/2012   Osteoarthritis 2016   Jefm Bryant)   Osteoporosis    dexa 2011   Perirectal abscess 08/03/2021   CT guided aspiration of abscess grow Proteums mirabilis and Citrobacter koseri 07/2021, currently receiving IV Zosyn planned total 6 wks.   Pseudogout 2016   shoulders ([place])    Past Surgical History:  Procedure Laterality Date   barium enema  2012   severe diverticulosis, redundant colon   Bowel obstruction  1999   no surgery in hosp x 3 days  BUBBLE STUDY  07/27/2021   Procedure: BUBBLE STUDY;  Surgeon: Dixie Dials, MD;  Location: [place];  Service: Cardiovascular;;   COLONOSCOPY  1. 1999  2. 11/04   1. Not finished  2. Slight hemorrhage rectosigmoid area, severe sig diverticulosis   COLONOSCOPY WITH PROPOFOL N/A 09/10/2019   SSP with dysplasia, TA, rpt 3 yrs Marius Ditch, Tally Due, MD)   Dexa  1. [phone]   2. 9/04  3. 3/08    1. OP  2. OP, borderline, spine -2.44T  3. decreased BMD-OP   DG KNEE 1-2 VIEWS BILAT     LS x-ray with degenerative disc and facet change    ESOPHAGOGASTRODUODENOSCOPY     Negative   FLEXIBLE SIGMOIDOSCOPY N/A 01/16/2021   Procedure: FLEXIBLE SIGMOIDOSCOPY;  Surgeon: Gatha Mayer, MD;  Location: [place];  Service: Endoscopy;  Laterality: N/A;  or unsedated   Hemorrhoid procedure  07/2006   PICC LINE REMOVAL ([facility] HX)  08/28/2021   PROCTOSCOPY N/A 05/26/2021   Procedure: RIGID PROCTOSCOPY;  Surgeon: Michael Boston, MD;  Location: [facility];  Service: General;  Laterality: N/A;   RECTOPEXY N/A 05/26/2021   Procedure: RECTOPEXY;  Surgeon: Michael Boston, MD;  Location: [facility];  Service: General;  Laterality: N/A;   TEE WITHOUT CARDIOVERSION N/A 07/27/2021   Procedure: TRANSESOPHAGEAL ECHOCARDIOGRAM (TEE);  Surgeon: Dixie Dials, MD;  Location[facility] ENDOSCOPY;  Service: Cardiovascular;  Laterality: N/A;   TONSILLECTOMY     TOTAL SHOULDER ARTHROPLASTY Left 11/27/2015   Corky Mull, MD   TOTAL SHOULDER REVISION Left 03/16/2016   Procedure: TOTAL SHOULDER REVISION;  Surgeon: Corky Mull, MD;  Location: [facility];  Service: Orthopedics;  Laterality: Left;   US ECHOCARDIOGRAPHY  83/4196   Normal systolic fxn with EF 22-29%.  Focal basal septal hypertrophy.  Mild diastolic dysfunction.  Mild MR.   XI ROBOTIC ASSISTED LOWER ANTERIOR RESECTION N/A 05/26/2021   Procedure: ROBOTIC LOW ANTERIOR RECTOSIGMOID RESECTION; ASSESSMENT OF TISSUE PERFUSION WITH FIREFLY;  Surgeon: Michael Boston, MD;  Location: [facility];  Service: General;  Laterality: N/A;    Current Medications: Current Meds  Medication Sig   acetaminophen (TYLENOL) 500 MG tablet Take 500-1,000 mg by mouth every 6 (six) hours as needed for moderate pain. Depends on pain   albuterol (PROVENTIL HFA;VENTOLIN HFA) 108 (90 Base) MCG/ACT inhaler Inhale 2 puffs into the lungs every 6 (six) hours as needed for wheezing or shortness of breath.   ALPHAGAN P 0.1 % SOLN Place 1 drop into both eyes daily.   apixaban (ELIQUIS) 5 MG TABS tablet Take 1 tablet (5 mg total) by mouth 2 (two) times  daily.   bimatoprost (LUMIGAN) 0.01 % SOLN Place 1 drop into both eyes at bedtime.   bisacodyl (DULCOLAX) 5 MG EC tablet Take 1 tablet (5 mg total) by mouth daily as needed for moderate constipation.   carvedilol (COREG) 6.25 MG tablet Take 1 tablet (6.25 mg total) by mouth 2 (two) times daily with a meal.   cholecalciferol (VITAMIN D) 1000 units tablet Take 1,000 Units by mouth daily.   clonazePAM (KLONOPIN) 0.5 MG tablet TAKE 1 TABLET BY MOUTH AT BEDTIME   colchicine 0.6 MG tablet Take 1 tablet (0.6 mg total) by mouth daily as needed (gout flare).   diclofenac Sodium (VOLTAREN) 1 % GEL Apply 1 application  topically 3 (three) times daily as needed.   Famotidine-Ca Carb-Mag Hydrox (PEPCID COMPLETE PO) Take 1 tablet by mouth daily as needed (indigestion).   ferrous sulfate 325 (65  FE) MG tablet Take 1 tablet (325 mg total) by mouth 2 (two) times daily with a meal.   furosemide (LASIX) 20 MG tablet Take 1 tablet (20 mg total) by mouth daily.   gabapentin (NEURONTIN) 300 MG capsule Take 1 capsule (300 mg total) by mouth daily as needed (nerve pain, itching).   hydrALAZINE (APRESOLINE) 25 MG tablet Take 1 tablet (25 mg total) by mouth every 8 (eight) hours.   hydrocortisone 2.5 % cream Place 1 application rectally daily as needed (hemorrhoids).   Infant Care Products [facility]) OINT Apply 1 application topically 2 (two) times daily as needed.   Iron-FA-B Cmp-C-Biot-Probiotic (FUSION PLUS) CAPS Take 1 capsule by mouth once daily   loperamide (IMODIUM) 2 MG capsule Take 1 capsule (2 mg total) by mouth 3 (three) times daily as needed for diarrhea or loose stools.   Misc Natural Products (OSTEO BI-FLEX TRIPLE STRENGTH PO) Take 1 tablet by mouth daily.   montelukast (SINGULAIR) 10 MG tablet Take 1 tablet (10 mg total) by mouth at bedtime.   Multiple Vitamin (MULTIVITAMIN ADULT PO) Take by mouth daily.   naltrexone (DEPADE) 50 MG tablet Take 1 tablet (50 mg total) by mouth daily.   omeprazole  (PRILOSEC) 20 MG capsule Take 20 mg by mouth daily as needed (heartburn).   ondansetron (ZOFRAN-ODT) 4 MG disintegrating tablet Take 1 tablet (4 mg total) by mouth every 8 (eight) hours as needed for nausea or vomiting.   polyethylene glycol (MIRALAX / GLYCOLAX) 17 g packet Take 17 g by mouth daily.   Potassium 99 MG TABS Take 1 tablet (99 mg total) by mouth every Monday, Wednesday, and Friday.   Simethicone 180 MG CAPS Take 1 capsule by mouth daily as needed (indigestion).   spironolactone (ALDACTONE) 25 MG tablet Take 1 tablet by mouth daily   SYNTHROID 125 MCG tablet Take 1 tablet (125 mcg total) by mouth daily before breakfast.     Allergies:   Amlodipine, Ace inhibitors, Carvedilol, Chlorthalidone, Losartan, Other, Paroxetine hcl, Risedronate sodium, Simvastatin, Toprol xl [metoprolol], Alendronate sodium, Influenza vaccines, Silenor [doxepin hcl], and Sulfonamide derivatives   Social History   Socioeconomic History   Marital status: Widowed    Spouse name: Not on file   Number of children: 2   Years of education: Not on file   Highest education level: Not on file  Occupational History   Occupation: Retired    Fish farm manager: RETIRED  Tobacco Use   Smoking status: Never   Smokeless tobacco: Never  Vaping Use   Vaping Use: Never used  Substance and Sexual Activity   Alcohol use: No   Drug use: No   Sexual activity: Not Currently  Other Topics Concern   Not on file  Social History Narrative   Left handed   Widow. Husband ([name]) deceased 03/26/16 from dementia. She was caregiver.    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   Activity: no regular exercise   Diet: good water, fruits/vegetables daily      Patient Care Team:   [name], MD as PCP - General (Family Medicine)   Kate Sable, MD as PCP - Cardiology (Cardiology)   Dannielle Karvonen, RN as [employer] [employer]    Gross, Remo Lipps, MD as Consulting Physician (General Surgery)   Lin Landsman, MD as Consulting Physician (Gastroenterology)   Kate Sable, MD as Consulting Physician (Cardiology)   Social Determinants of  Health   Financial Resource Strain: Low Risk  (03/30/2021)   Overall Financial Resource Strain (CARDIA)    Difficulty of Paying Living Expenses: Not very hard  Food Insecurity: No Food Insecurity (03/30/2021)   Hunger Vital Sign    Worried About Running Out of Food in the Last Year: Never true    Ran Out of Food in the Last Year: Never true  Transportation Needs: No Transportation Needs (03/30/2021)   PRAPARE - Hydrologist (Medical): No    Lack of Transportation (Non-Medical): No  Physical Activity: Insufficiently Active (03/30/2021)   Exercise Vital Sign    Days of Exercise per Week: 7 days    Minutes of Exercise per Session: 10 min  Stress: No Stress Concern Present (03/30/2021)   [place]    Feeling of Stress : Only a little  Social Connections: Socially Isolated (03/30/2021)   Social Connection and Isolation Panel [NHANES]    Frequency of Communication with Friends and Family: More than three times a week    Frequency of Social Gatherings with Friends and Family: Three times a week    Attends Religious Services: Never    Active Member of Clubs or Organizations: No    Attends Archivist Meetings: Never    Marital Status: Widowed     Family History: The patient's family history includes Breast cancer in her cousin; Coronary artery disease in her brother; Diabetes in her brother and paternal grandfather.  ROS:   Please see the history of present illness.     All other systems reviewed and are negative.  EKGs/Labs/Other Studies Reviewed:    The following studies were reviewed today:   EKG:  EKG is ordered today.  EKG shows atrial flutter, heart rate  58  Recent Labs: 02/10/2021: Pro B Natriuretic peptide (BNP) 165.0 09/07/2021: TSH 18.420 09/16/2021: Magnesium 1.8 09/25/2021: ALT 32; BUN 24; Creat 1.05; Potassium 4.4; Sodium 139 10/28/2021: Hemoglobin 14.4; Platelets 254  Recent Lipid Panel    Component Value Date/Time   CHOL 179 03/27/2021 0935   TRIG 69 03/27/2021 0935   HDL 71 03/27/2021 0935   CHOLHDL 2.5 03/27/2021 0935   CHOLHDL 3.6 03/17/2020 1519   VLDL 27 03/17/2020 1519   LDLCALC 95 03/27/2021 0935   LDLDIRECT 86.0 09/19/2014 1049    Physical Exam:    VS:  BP 140/80 (BP Location: Left Arm, Patient Position: Sitting, Cuff Size: Normal)   Pulse (!) 58   Ht '5\' 1"'$  (1.549 m)   Wt 146 lb 8 oz (66.5 kg)   SpO2 98%   BMI 27.68 kg/m     Wt Readings from Last 3 Encounters:  11/13/21 146 lb 8 oz (66.5 kg)  11/13/21 146 lb 9.6 oz (66.5 kg)  10/28/21 147 lb (66.7 kg)     GEN:  Well nourished, well developed in no acute distress HEENT: Normal NECK: No JVD; No carotid bruits CARDIAC: Irregular heartbeat, 3/6 systolic murmur RESPIRATORY:  Clear to auscultation without rales, wheezing or rhonchi  ABDOMEN: Soft, non-tender, non-distended MUSCULOSKELETAL: Trace edema; No deformity  SKIN: Warm and dry NEUROLOGIC:  Alert and oriented x 3 PSYCHIATRIC:  Normal affect   ASSESSMENT:    1. Atrial flutter, unspecified type ([place])   2. Primary hypertension   3. Aortic valve stenosis, etiology of cardiac valve disease unspecified    PLAN:    In order of problems listed above:  Atrial flutter, heart rate controlled, largely  asymptomatic.  Continue Coreg, Eliquis, EF 55%.  Plan DC cardioversion.   Hypertension, BP controlled.  Continue carvedilol, hydralazine, Aldactone 25 mg daily. Mild aortic stenosis, unchanged from echocardiogram 1 year ago.  Continue to monitor serially  Follow-up in 6 weeks.  Shared Decision Making/Informed Consent The risks (stroke, cardiac arrhythmias rarely resulting in the need for a temporary or  permanent pacemaker, skin irritation or burns and complications associated with conscious sedation including aspiration, arrhythmia, respiratory failure and death), benefits (restoration of normal sinus rhythm) and alternatives of a direct current cardioversion were explained in detail to Ms. Covey and she agrees to proceed.      Medication Adjustments/Labs and Tests Ordered: Current medicines are reviewed at length with the patient today.  Concerns regarding medicines are outlined above.  Orders Placed This Encounter  Procedures   CBC   Basic metabolic panel   EKG [id number]    No orders of the defined types were placed in this encounter.     Patient Instructions  Medication Instructions:   Your physician recommends that you continue on your current medications as directed. Please refer to the Current Medication list given to you today.  *If you need a refill on your cardiac medications before your next appointment, please call your pharmacy*   Lab Work:  Please go to the [place] after your appointment today for a lab [facility]) draw.   Testing/Procedures:  You are scheduled for a Cardioversion on __Thursday_7/27/23_____ with Dr.__Agbor-Etang_________ Please arrive at the [place] [facility] at ___0630______ a.m. on the day of your procedure.  DIET INSTRUCTIONS:  Nothing to eat or drink after midnight except your medications with a              sip of water.        Labs: __Drawn after Office Visit 7/21___  Medications:  YOU MAY TAKE ALL of your remaining medications with a small amount of water.  Must have a responsible person to drive you home.  Bring a current list of your medications and current insurance cards.    If you have any questions after you get home, please call the office at [phone]    Follow-Up: At [facility], you and your health needs are our priority.  As part of our continuing mission to provide you with exceptional heart care, we have  created designated Provider Care Teams.  These Care Teams include your primary Cardiologist (physician) and Advanced Practice Providers (APPs -  Physician Assistants and Nurse Practitioners) who all work together to provide you with the care you need, when you need it.  We recommend signing up for the patient portal called "MyChart".  Sign up information is provided on this After Visit Summary.  MyChart is used to connect with patients for Virtual Visits (Telemedicine).  Patients are able to view lab/test results, encounter notes, upcoming appointments, etc.  Non-urgent messages can be sent to your provider as well.   To learn more about what you can do with MyChart, go to [email or web address].    Your next appointment:   6 week(s)  The format for your next appointment:   In Person  Provider:   You may see Kate Sable, MD or one of the following Advanced Practice Providers on your designated Care Team:   Murray Hodgkins, NP Christell Faith, PA-C Cadence Kathlen Mody, [place]    Other Instructions   Important Information About Sugar         Signed, Kate Sable, MD  11/13/2021  12:35 PM    [facility]

## 2021-11-13 NOTE — Telephone Encounter (Signed)
Spoke to [place] with [employer]. He stated that he requested bipap download.  He sees a standing order for vent on file from Dr. Candiss Norse. Bipap was ordered by Dr. [place] Pink.  He is unsure if bipap can be set to cpap settings, however he will reach out to cpap team for details.  Routing to Tammy to make aware.

## 2021-11-13 NOTE — Telephone Encounter (Signed)
Per TP--contact [employer] and ask if machine is cpap/bipap combo and verify settings. According to download is looks like machine is set to pressure of 5.  Spoke to [place] with [employer]. She was able to locate CMN and according to order currently settings are IPAP 10 AND EPAP of

## 2021-11-13 NOTE — Telephone Encounter (Signed)
Report has been received and given to [facility] for review.

## 2021-11-13 NOTE — Patient Instructions (Signed)
Continue on BIPAP At bedtime  with oxygen  Set up for Sleep titration study.  Continue on Oxygen 4l/m At bedtime  with BIPAP  Follow up in 6-8 weeks and As needed   Please contact office for sooner follow up if symptoms do not improve or worsen or seek emergency care

## 2021-11-13 NOTE — Telephone Encounter (Signed)
Patient is aware of below message/recommendations.  Order placed to [employer] to change pressure settings.  Nothing further needed.

## 2021-11-13 NOTE — Patient Instructions (Signed)
Medication Instructions:   Your physician recommends that you continue on your current medications as directed. Please refer to the Current Medication list given to you today.  *If you need a refill on your cardiac medications before your next appointment, please call your pharmacy*   Lab Work:  Please go to the [place] after your appointment today for a lab [facility]) draw.   Testing/Procedures:  You are scheduled for a Cardioversion on __Thursday_7/27/23_____ with Dr.__Agbor-Etang_________ Please arrive at the [place] [facility] at ___0630______ a.m. on the day of your procedure.  DIET INSTRUCTIONS:  Nothing to eat or drink after midnight except your medications with a              sip of water.        Labs: __Drawn after Office Visit 7/21___  Medications:  YOU MAY TAKE ALL of your remaining medications with a small amount of water.  Must have a responsible person to drive you home.  Bring a current list of your medications and current insurance cards.    If you have any questions after you get home, please call the office at [phone]    Follow-Up: At [facility], you and your health needs are our priority.  As part of our continuing mission to provide you with exceptional heart care, we have created designated Provider Care Teams.  These Care Teams include your primary Cardiologist (physician) and Advanced Practice Providers (APPs -  Physician Assistants and Nurse Practitioners) who all work together to provide you with the care you need, when you need it.  We recommend signing up for the patient portal called "MyChart".  Sign up information is provided on this After Visit Summary.  MyChart is used to connect with patients for Virtual Visits (Telemedicine).  Patients are able to view lab/test results, encounter notes, upcoming appointments, etc.  Non-urgent messages can be sent to your provider as well.   To learn more about what you can do with MyChart, go to  [email or web address].    Your next appointment:   6 week(s)  The format for your next appointment:   In Person  Provider:   You may see Kate Sable, MD or one of the following Advanced Practice Providers on your designated Care Team:   Murray Hodgkins, NP Christell Faith, PA-C Cadence Kathlen Mody, [place]    Other Instructions   Important Information About Sugar

## 2021-11-13 NOTE — Progress Notes (Signed)
e  '@Patient'$  ID: Brooke Beasley, female    DOB: 18-Jan-1931, 88 y.o.   MRN: [id number]  Chief Complaint  Patient presents with   sleep consult    Referring provider: [name], MD  HPI: 88 year old female seen for sleep consult November 13, 2021 for nocturnal hypoxemia and witnessed apneic events. Patient was re hospitalized May 2023 for L5-S1 discitis/epidural abscess (Proteus/Citrobacter) requiring prolonged antibiotics.  Initially treated with IV Zosyn and transition to oral Augmentin until Sep 14, 2021.  She was admitted with severe nausea decreased oral intake found to have severe hyponatremia after starting on chlorthalidone in the outpatient setting.  She required admission to ICU History of C. difficile, A-fib, congestive heart failure, aortic valve stenosis  TEST/EVENTS :   3/31, follow-up MRI with contrast showed a 4 mm epidural phlegmon without drainable component. 3/31, underwent disc aspiration that grew Enterococcus.  Following CT scan showed decrease in the size of seroma. 4/2, started endocarditis coverage with Unasyn 4/3, TEE negative for endocarditis antibiotics adjusted by ID, PICC line placed by vascular access team 4/7, MRI lumbar and sacral spine were repeated because of persistent pain and elevated inflammatory markers.  8 showed continued discitis osteomyelitis and L5-S1 level.  It also showed 6 cm size collection posterior to the rectum likely an abscess. 4/10: CT-guided aspiration of pelvic fluid collection posterior to the rectum.  3/30-4/13>> hospitalization for L5-S1 discitis/abscess posterior to rectum-cultures Proteus/Citrobacter-discharged on IV Zosyn-stop date 5/22.  New onset A-fib-started on anticoagulation. 5/03>> outpatient MRI-resolution of L5-S1 epidural abscess, persistent L5-S1 discitis/osteomyelitis. 5/11>> due to persistent leukocytosis-intermittent fevers-PICC line removed on 5/5-transition to Augmentin by ID in the outpatient  setting. 5/14>> admit for hyponatremia. 5/15>> no response to IVF-renal consult-to ICU for 3% NaCl.   5/17 >> transfer back to [facility]-off 3% NaCl since 5/16.   11/13/2021 Sleep consult  Patient presents for a sleep consult kindly referred by primary care physician Dr. Danise Mina.  Patient was recently rehospitalized in May 2023 for severe nausea decreased oral intake and severe hyponatremia.  Patient has been treated since March for L5-S1 discitis/abscess with (Proteus/Citrobacter) was treated with prolonged antibiotics.  Completed on Sep 14, 2021.  During most recent hospitalization patient had severe hyponatremia and required ICU admission for 3% saline.  Patient improved gradually.  However during her hospital stay she was noted to have severe nocturnal hypoxemia with suspected to have underlying sleep apnea/OHS with thoracic restriction. Has scoliosis ,has moderate to severe scoliosis thoracolumbar spine.  Patient was tried on oxygen but continued to have ongoing desaturations into the low 80s.  She required CPAP therapy with oxygen at bedtime while sleeping.  At discharge patient was recommended to be discharged on CPAP support with Oxygen . Marland Kitchen  Unfortunately insurance would not cover.  Patient had to purchase a sleep machine on her own through a local homecare company. DME sold her a used Luna BIPAP for $900.  Daughter says she has snored severely for years, restless sleep and witnessed apneic events.  Patient has never previously been diagnosed with sleep apnea.  She typically goes to bed about 9 PM.  Goes to sleep pretty quickly.  Is up about once or twice a night.  Gets up about 7:30 AM.  Does feel better since she started on BiPAP and wearing it throughout the night.  Weight is up about 5 pounds current weight is at 146 pounds with a BMI at 27. She has an extensive cardiac history with A-fib and congestive heart failure.  Also  has aortic valve stenosis.  Epworth score is 11 out of 24.  Patient is sleepy  during the daytime.  Also gets sleepy if she is inactive or in the afternoon hours She says she has been in her BiPAP all night long every night.  Says that she does feel like she is benefiting from BiPAP.  She sleeps much better and feels better the next morning.  Back issues started after rectal prolapse repair 04/2021. Has completed all antibiotics. Follow up labs 09/25/21 , Sodium level back to normal. She is feeling better. Finished PT at home. Still weak and some soreness in back . Back pain is much better. Fevers have resolved.   Social history patient is retired .  She is widowed.  Lives with her daughter and son-in-law.  Has adult children-2 . Never smoker, alcohol /drugs.   Family history:  None   Surgical history : Rectal prolapse repair 04/2021,  Left shoulder replacement   Allergies  Allergen Reactions   Amlodipine Other (See Comments)    Stomach pains--"feel like my insides are on fire"   Ace Inhibitors Cough   Carvedilol Other (See Comments)    fatigue   Chlorthalidone Other (See Comments)    Severe hyponatremia   Losartan Other (See Comments)    Cr bumped   Other Other (See Comments)    No seeds or corn because of IBS   Paroxetine Hcl Other (See Comments)    Not effective - felt ill on this medicine   Risedronate Sodium     REACTION: joint pain   Simvastatin     REACTION: the full 20 mg pill causes leg pain- can tol 10 mg   Toprol Xl [Metoprolol] Diarrhea    Diarrhea and weakness   Alendronate Sodium Palpitations   Influenza Vaccines Rash and Other (See Comments)    Led to mental breakdown in 1960s    Silenor [Doxepin Hcl] Palpitations    Hallucinations and racing heart   Sulfonamide Derivatives Rash    Immunization History  Administered Date(s) Administered   PFIZER(Purple Top)SARS-COV-2 Vaccination 08/10/2019, 08/31/2019   Pneumococcal Conjugate-13 06/08/2013   Pneumococcal Polysaccharide-23 04/26/2002   Td 04/26/1998, 10/22/2008   Zoster, Live 05/11/2010     Past Medical History:  Diagnosis Date   Anxiety    C. difficile diarrhea 07/11/2018   S/p multiple oral vanc treatments (course and tapers) and completed Zinplava monoclonal Ab treatment (05/10/2019) Fecal transplant on hold during COVID pandemic   CAP (community acquired pneumonia) 12/22/2017   Carotid stenosis 10/18/2014   R 1-39%, L 40-59%, rpt 1 yr (09/2014)    CKD (chronic kidney disease) stage 3, GFR 30-59 ml/min ([place]) 08/02/2014   Closed fracture of right orbit ([place]) 02/10/2021   Clostridioides difficile infection    hx 2020   COVID-19 virus infection 10/13/2020   Degenerative disc disease    LS   Depression    nervous breakdonw in 1964-out of work for a year   Diverticulosis    severe by colonoscopy   Family history of adverse reaction to anesthesia    n/v   GERD (gastroesophageal reflux disease)    Glaucoma    Heart murmur 08/2011   mitral regurge - on echo    History of hiatal hernia    History of shingles    HTN (hypertension)    Hyperlipidemia    Hypertension    Hypothyroidism    IBS 11/02/2006   Intestinal bacterial overgrowth    In small colon  Left shoulder pain 11/04/2014   Lower GI bleed 12/2020   thought diverticular complicated by ABLA with syncope and orbital fracture s/p hospitalization   Maxillary fracture ([place]) 04/08/2012   Osteoarthritis 2016   J[facility])   Osteoporosis    dexa 2011   Perirectal abscess 08/03/2021   CT guided aspiration of abscess grow Proteums mirabilis and Citrobacter koseri 07/2021, currently receiving IV Zosyn planned total 6 wks.   Pseudogout 2016   shoulders ([place])    Tobacco History: Social History   Tobacco Use  Smoking Status Never  Smokeless Tobacco Never   Counseling given: Not Answered   Outpatient Medications Prior to Visit  Medication Sig Dispense Refill   acetaminophen (TYLENOL) 500 MG tablet Take 500-1,000 mg by mouth every 6 (six) hours as needed for moderate pain. Depends on pain     albuterol  (PROVENTIL HFA;VENTOLIN HFA) 108 (90 Base) MCG/ACT inhaler Inhale 2 puffs into the lungs every 6 (six) hours as needed for wheezing or shortness of breath. 1 Inhaler 0   ALPHAGAN P 0.1 % SOLN Place 1 drop into both eyes daily.     apixaban (ELIQUIS) 5 MG TABS tablet Take 1 tablet (5 mg total) by mouth 2 (two) times daily. 60 tablet 2   bimatoprost (LUMIGAN) 0.01 % SOLN Place 1 drop into both eyes at bedtime.     bisacodyl (DULCOLAX) 5 MG EC tablet Take 1 tablet (5 mg total) by mouth daily as needed for moderate constipation. 30 tablet 1   carvedilol (COREG) 6.25 MG tablet Take 1 tablet (6.25 mg total) by mouth 2 (two) times daily with a meal. 180 tablet 1   cholecalciferol (VITAMIN D) 1000 units tablet Take 1,000 Units by mouth daily.     clonazePAM (KLONOPIN) 0.5 MG tablet TAKE 1 TABLET BY MOUTH AT BEDTIME 30 tablet 0   colchicine 0.6 MG tablet Take 1 tablet (0.6 mg total) by mouth daily as needed (gout flare). 90 tablet 0   diclofenac Sodium (VOLTAREN) 1 % GEL Apply 1 application  topically 3 (three) times daily as needed.     Famotidine-Ca Carb-Mag Hydrox (PEPCID COMPLETE PO) Take 1 tablet by mouth daily as needed (indigestion).     ferrous sulfate 325 (65 FE) MG tablet Take 1 tablet (325 mg total) by mouth 2 (two) times daily with a meal. 60 tablet 3   furosemide (LASIX) 20 MG tablet Take 1 tablet (20 mg total) by mouth daily. 30 tablet 6   gabapentin (NEURONTIN) 300 MG capsule Take 1 capsule (300 mg total) by mouth daily as needed (nerve pain, itching). 30 capsule 3   hydrALAZINE (APRESOLINE) 25 MG tablet Take 1 tablet (25 mg total) by mouth every 8 (eight) hours. 90 tablet 0   hydrocortisone 2.5 % cream Place 1 application rectally daily as needed (hemorrhoids).     Infant Care Products [facility]) OINT Apply 1 application topically 2 (two) times daily as needed. 430 g 0   Iron-FA-B Cmp-C-Biot-Probiotic (FUSION PLUS) CAPS Take 1 capsule by mouth once daily 30 capsule 2   loperamide (IMODIUM)  2 MG capsule Take 1 capsule (2 mg total) by mouth 3 (three) times daily as needed for diarrhea or loose stools. 30 capsule 0   Misc Natural Products (OSTEO BI-FLEX TRIPLE STRENGTH PO) Take 1 tablet by mouth daily.     montelukast (SINGULAIR) 10 MG tablet Take 1 tablet (10 mg total) by mouth at bedtime. 30 tablet 0   Multiple Vitamin (MULTIVITAMIN ADULT PO) Take by mouth  daily.     naltrexone (DEPADE) 50 MG tablet Take 1 tablet (50 mg total) by mouth daily. 30 tablet 0   omeprazole (PRILOSEC) 20 MG capsule Take 20 mg by mouth daily as needed (heartburn).     ondansetron (ZOFRAN-ODT) 4 MG disintegrating tablet Take 1 tablet (4 mg total) by mouth every 8 (eight) hours as needed for nausea or vomiting. 20 tablet 0   polyethylene glycol (MIRALAX / GLYCOLAX) 17 g packet Take 17 g by mouth daily.     Potassium 99 MG TABS Take 1 tablet (99 mg total) by mouth every Monday, Wednesday, and Friday. 330 tablet    Simethicone 180 MG CAPS Take 1 capsule by mouth daily as needed (indigestion).     spironolactone (ALDACTONE) 25 MG tablet Take 1 tablet by mouth daily 90 tablet 2   SYNTHROID 125 MCG tablet Take 1 tablet (125 mcg total) by mouth daily before breakfast. 90 tablet 1   No facility-administered medications prior to visit.     Review of Systems:   Constitutional:   No  weight loss, night sweats,  Fevers, chills,  +fatigue, or  lassitude.  HEENT:   No headaches,  Difficulty swallowing,  Tooth/dental problems, or  Sore throat,                No sneezing, itching, ear ache, nasal congestion, post nasal drip,   CV:  No chest pain,  Orthopnea, PND, swelling in lower extremities, anasarca, dizziness, palpitations, syncope.   GI  No heartburn, indigestion, abdominal pain, nausea, vomiting, diarrhea, change in bowel habits, loss of appetite, bloody stools.   Resp:  No excess mucus, no productive cough,  No non-productive cough,  No coughing up of blood.  No change in color of mucus.  No wheezing.  No  chest wall deformity  Skin: no rash or lesions.  GU: no dysuria, change in color of urine, no urgency or frequency.  No flank pain, no hematuria   MS:  No joint pain or swelling.  No decreased range of motion.  No back pain.    Physical Exam  BP 130/76 (BP Location: Left Arm, Cuff Size: Normal)   Pulse 66   Temp 97.9 F (36.6 C) (Temporal)   Ht '5\' 1"'$  (1.549 m)   Wt 146 lb 9.6 oz (66.5 kg)   SpO2 97%   BMI 27.70 kg/m   GEN: A/Ox3; pleasant , NAD, well nourished , cane    HEENT:  [place]/AT,  EACs-clear, TMs-wnl, NOSE-clear, THROAT-clear, no lesions, no postnasal drip or exudate noted. Class 3 MP airway   NECK:  Supple w/ fair ROM; no JVD; normal carotid impulses w/o bruits; no thyromegaly or nodules palpated; no lymphadenopathy.    RESP  Clear  P & A; w/o, wheezes/ rales/ or rhonchi. no accessory muscle use, no dullness to percussion  CARD:  irreg/irreg , no m/r/g, no peripheral edema, pulses intact, no cyanosis or clubbing.  GI:   Soft & nt; nml bowel sounds; no organomegaly or masses detected.   Musco: Warm bil, no deformities or joint swelling noted.   Neuro: alert, no focal deficits noted.    Skin: Warm, no lesions or rashes    Lab Results:  CBC    Component Value Date/Time   WBC 6.5 11/13/2021 1255   RBC 4.98 11/13/2021 1255   HGB 14.5 11/13/2021 1255   HGB 13.7 04/09/2021 1417   HCT 45.4 11/13/2021 1255   HCT 42.0 04/09/2021 1417   PLT 245 11/13/2021  1255   PLT 259 04/09/2021 1417   MCV 91.2 11/13/2021 1255   MCV 88 04/09/2021 1417   MCH 29.1 11/13/2021 1255   MCHC 31.9 11/13/2021 1255   RDW 16.5 (H) 11/13/2021 1255   RDW 16.2 (H) 04/09/2021 1417   LYMPHSABS 1,566 10/28/2021 1623   LYMPHSABS 1.8 03/27/2021 0935   MONOABS 1.3 (H) 09/13/2021 0052   EOSABS 123 10/28/2021 1623   EOSABS 0.5 (H) 03/27/2021 0935   BASOSABS 18 10/28/2021 1623   BASOSABS 0.1 03/27/2021 0935    BMET    Component Value Date/Time   NA 138 11/13/2021 1255   NA 136  03/27/2021 0935   K 4.0 11/13/2021 1255   CL 101 11/13/2021 1255   CO2 30 11/13/2021 1255   GLUCOSE 109 (H) 11/13/2021 1255   BUN 27 (H) 11/13/2021 1255   BUN 22 03/27/2021 0935   CREATININE 1.22 (H) 11/13/2021 1255   CREATININE 1.05 (H) 09/25/2021 1551   CALCIUM 10.2 11/13/2021 1255   GFRNONAA 43 (L) 11/13/2021 1255   GFRAA 58 (L) 03/28/2019 1109    BNP No results found for: "BNP"  ProBNP    Component Value Date/Time   PROBNP 165.0 (H) 02/10/2021 1313    Imaging: No results found.       Latest Ref Rng & Units 09/14/2021    2:00 PM  PFT Results  FVC-Pre L 1.87   FVC-Predicted Pre % 98   Pre FEV1/FVC % % 72   FEV1-Pre L 1.34   FEV1-Predicted Pre % 97     No results found for: "NITRICOXIDE"      Assessment & Plan:   Nocturnal hypoxemia Significant witnessed nocturnal hypoxemia during recent hospitalization requiring CPAP support with oxygen to maintain O2 saturations greater than 88 to 90%.  Patient with reported snoring, witnessed apneic events and history of A-fib.  Along with suspected restrictive lung disease from scoliosis all suspicious for underlying sleep apnea and hypoventilation syndrome.  Patient is currently on BiPAP with oxygen.  She will continue on current settings.  We have requested information from her homecare company and also a BiPAP download. We will set patient up for an in lab split-night sleep study. Patient education was given to daughter and patient.  - discussed how weight can impact sleep and risk for sleep disordered breathing - discussed options to assist with weight loss: combination of diet modification, cardiovascular and strength training exercises   - had an extensive discussion regarding the adverse health consequences related to untreated sleep disordered breathing - specifically discussed the risks for hypertension, coronary artery disease, cardiac dysrhythmias, cerebrovascular disease, and diabetes - lifestyle modification  discussed   - discussed how sleep disruption can increase risk of accidents, particularly when driving - safe driving practices were discussed   Plan  Patient Instructions  Continue on BIPAP At bedtime  with oxygen  Set up for Sleep titration study.  Continue on Oxygen 4l/m At bedtime  with BIPAP  Follow up in 6-8 weeks and As needed   Please contact office for sooner follow up if symptoms do not improve or worsen or seek emergency care        Respiratory failure with hypoxia ([place]) Continue on oxygen with BiPAP at bedtime.  Split-night sleep study is pending.  Plan  Patient Instructions  Continue on BIPAP At bedtime  with oxygen  Set up for Sleep titration study.  Continue on Oxygen 4l/m At bedtime  with BIPAP  Follow up in 6-8 weeks and As  needed   Please contact office for sooner follow up if symptoms do not improve or worsen or seek emergency care       Persistent atrial fibrillation [facility]) Continue follow-up with cardiology.  Sleep study is pending   I spent 62   minutes dedicated to the care of this patient on the date of this encounter to include pre-visit review of records, face-to-face time with the patient discussing conditions above, post visit ordering of testing, clinical documentation with the electronic health record, making appropriate referrals as documented, and communicating necessary findings to members of the patients care team.    Rexene Edison, NP 11/13/2021

## 2021-11-13 NOTE — Assessment & Plan Note (Signed)
Continue follow-up with cardiology.  Sleep study is pending

## 2021-11-13 NOTE — Assessment & Plan Note (Addendum)
Significant witnessed nocturnal hypoxemia during recent hospitalization requiring CPAP support with oxygen to maintain O2 saturations greater than 88 to 90%.  Patient with reported snoring, witnessed apneic events and history of A-fib.  Along with suspected restrictive lung disease from scoliosis all suspicious for underlying sleep apnea and hypoventilation syndrome.  Patient is currently on BiPAP with oxygen.  She will continue on current settings.  We have requested information from her homecare company and also a BiPAP download. We will set patient up for an in lab split-night sleep study. Patient education was given to daughter and patient.  - discussed how weight can impact sleep and risk for sleep disordered breathing - discussed options to assist with weight loss: combination of diet modification, cardiovascular and strength training exercises   - had an extensive discussion regarding the adverse health consequences related to untreated sleep disordered breathing - specifically discussed the risks for hypertension, coronary artery disease, cardiac dysrhythmias, cerebrovascular disease, and diabetes - lifestyle modification discussed   - discussed how sleep disruption can increase risk of accidents, particularly when driving - safe driving practices were discussed   Plan  Patient Instructions  Continue on BIPAP At bedtime  with oxygen  Set up for Sleep titration study.  Continue on Oxygen 4l/m At bedtime  with BIPAP  Follow up in 6-8 weeks and As needed   Please contact office for sooner follow up if symptoms do not improve or worsen or seek emergency care

## 2021-11-13 NOTE — Telephone Encounter (Signed)
Lm for Brad with [employer] to request bipap download.

## 2021-11-13 NOTE — H&P (View-Only) (Signed)
Cardiology Office Note:    Date:  11/13/2021   ID:  Mckynzi Cammon, DOB [birth year], MRN [id number]  PCP:  [name], MD  [facility] Cardiologist:  Kate Sable, MD  [place] Electrophysiologist:  None   Referring MD: [name], MD   Chief Complaint  Patient presents with   Evaluation for DCCV    Patient c/o shortness of breath at times and occasional mild chest pain.  Medications reviewed by the patient verbally.     History of Present Illness:    Brooke Beasley is a 88 y.o. female with a hx of anxiety, persistent A-fib, grade 2 diastolic dysfunction, hypertension, hyperlipidemia, CKD 3, mild aortic stenosis who presents for follow-up.    Previously seen for persistent atrial flutter, DC cardioversion was recommended, patient wanted to be evaluated pricing.  Would like to move forward with DC cardioversion.  Has been taking Eliquis as prescribed, no interruption over the past 4 weeks.  Denies any bleeding issues.  Also takes carvedilol for heart rate control.  Denies palpitations.  Prior notes Echo TTE 07/2021 EF 55 to 60%, moderately dilated LA, moderate MR, posterior mitral leaflet prolapse Echocardiogram on 11/26/2019 showed normal systolic function, EF 60 to 93%, grade 2 diastolic dysfunction.  Mild to moderate MR, mild AS. Renal artery stenosis about 1 to 59% on the right, left renal artery unsuccessful to assess  Patient previously took lisinopril, but stopped due to side effects/cough.  Amlodipine stopped due to abdominal pains, Coreg was stopped because of fatigue.  Past Medical History:  Diagnosis Date   Anxiety    C. difficile diarrhea 07/11/2018   S/p multiple oral vanc treatments (course and tapers) and completed Zinplava monoclonal Ab treatment (05/10/2019) Fecal transplant on hold during COVID pandemic   CAP (community acquired pneumonia) 12/22/2017   Carotid stenosis 10/18/2014   R 1-39%, L 40-59%, rpt 1 yr (09/2014)     CKD (chronic kidney disease) stage 3, GFR 30-59 ml/min (HCC) 08/02/2014   Closed fracture of right orbit ([place]) 02/10/2021   Clostridioides difficile infection    hx 2020   COVID-19 virus infection 10/13/2020   Degenerative disc disease    LS   Depression    nervous breakdonw in 1964-out of work for a year   Diverticulosis    severe by colonoscopy   Family history of adverse reaction to anesthesia    n/v   GERD (gastroesophageal reflux disease)    Glaucoma    Heart murmur 08/2011   mitral regurge - on echo    History of hiatal hernia    History of shingles    HTN (hypertension)    Hyperlipidemia    Hypertension    Hypothyroidism    IBS 11/02/2006   Intestinal bacterial overgrowth    In small colon   Left shoulder pain 11/04/2014   Lower GI bleed 12/2020   thought diverticular complicated by ABLA with syncope and orbital fracture s/p hospitalization   Maxillary fracture ([place]) 04/08/2012   Osteoarthritis 2016   Jefm Bryant)   Osteoporosis    dexa 2011   Perirectal abscess 08/03/2021   CT guided aspiration of abscess grow Proteums mirabilis and Citrobacter koseri 07/2021, currently receiving IV Zosyn planned total 6 wks.   Pseudogout 2016   shoulders ([place])    Past Surgical History:  Procedure Laterality Date   barium enema  2012   severe diverticulosis, redundant colon   Bowel obstruction  1999   no surgery in hosp x 3 days  BUBBLE STUDY  07/27/2021   Procedure: BUBBLE STUDY;  Surgeon: Dixie Dials, MD;  Location: [place];  Service: Cardiovascular;;   COLONOSCOPY  1. 1999  2. 11/04   1. Not finished  2. Slight hemorrhage rectosigmoid area, severe sig diverticulosis   COLONOSCOPY WITH PROPOFOL N/A 09/10/2019   SSP with dysplasia, TA, rpt 3 yrs Marius Ditch, Tally Due, MD)   Dexa  1. [phone]   2. 9/04  3. 3/08    1. OP  2. OP, borderline, spine -2.44T  3. decreased BMD-OP   DG KNEE 1-2 VIEWS BILAT     LS x-ray with degenerative disc and facet change    ESOPHAGOGASTRODUODENOSCOPY     Negative   FLEXIBLE SIGMOIDOSCOPY N/A 01/16/2021   Procedure: FLEXIBLE SIGMOIDOSCOPY;  Surgeon: Gatha Mayer, MD;  Location: [place];  Service: Endoscopy;  Laterality: N/A;  or unsedated   Hemorrhoid procedure  07/2006   PICC LINE REMOVAL ([place] HX)  08/28/2021   PROCTOSCOPY N/A 05/26/2021   Procedure: RIGID PROCTOSCOPY;  Surgeon: Michael Boston, MD;  Location: [facility];  Service: General;  Laterality: N/A;   RECTOPEXY N/A 05/26/2021   Procedure: RECTOPEXY;  Surgeon: Michael Boston, MD;  Location: [facility];  Service: General;  Laterality: N/A;   TEE WITHOUT CARDIOVERSION N/A 07/27/2021   Procedure: TRANSESOPHAGEAL ECHOCARDIOGRAM (TEE);  Surgeon: Dixie Dials, MD;  Location[facility];  Service: Cardiovascular;  Laterality: N/A;   TONSILLECTOMY     TOTAL SHOULDER ARTHROPLASTY Left 11/27/2015   Corky Mull, MD   TOTAL SHOULDER REVISION Left 03/16/2016   Procedure: TOTAL SHOULDER REVISION;  Surgeon: Corky Mull, MD;  Location: [facility];  Service: Orthopedics;  Laterality: Left;   US ECHOCARDIOGRAPHY  58/0998   Normal systolic fxn with EF 33-82%.  Focal basal septal hypertrophy.  Mild diastolic dysfunction.  Mild MR.   XI ROBOTIC ASSISTED LOWER ANTERIOR RESECTION N/A 05/26/2021   Procedure: ROBOTIC LOW ANTERIOR RECTOSIGMOID RESECTION; ASSESSMENT OF TISSUE PERFUSION WITH FIREFLY;  Surgeon: Michael Boston, MD;  Location: [facility];  Service: General;  Laterality: N/A;    Current Medications: Current Meds  Medication Sig   acetaminophen (TYLENOL) 500 MG tablet Take 500-1,000 mg by mouth every 6 (six) hours as needed for moderate pain. Depends on pain   albuterol (PROVENTIL HFA;VENTOLIN HFA) 108 (90 Base) MCG/ACT inhaler Inhale 2 puffs into the lungs every 6 (six) hours as needed for wheezing or shortness of breath.   ALPHAGAN P 0.1 % SOLN Place 1 drop into both eyes daily.   apixaban (ELIQUIS) 5 MG TABS tablet Take 1 tablet (5 mg total) by mouth 2 (two) times  daily.   bimatoprost (LUMIGAN) 0.01 % SOLN Place 1 drop into both eyes at bedtime.   bisacodyl (DULCOLAX) 5 MG EC tablet Take 1 tablet (5 mg total) by mouth daily as needed for moderate constipation.   carvedilol (COREG) 6.25 MG tablet Take 1 tablet (6.25 mg total) by mouth 2 (two) times daily with a meal.   cholecalciferol (VITAMIN D) 1000 units tablet Take 1,000 Units by mouth daily.   clonazePAM (KLONOPIN) 0.5 MG tablet TAKE 1 TABLET BY MOUTH AT BEDTIME   colchicine 0.6 MG tablet Take 1 tablet (0.6 mg total) by mouth daily as needed (gout flare).   diclofenac Sodium (VOLTAREN) 1 % GEL Apply 1 application  topically 3 (three) times daily as needed.   Famotidine-Ca Carb-Mag Hydrox (PEPCID COMPLETE PO) Take 1 tablet by mouth daily as needed (indigestion).   ferrous sulfate 325 (65  FE) MG tablet Take 1 tablet (325 mg total) by mouth 2 (two) times daily with a meal.   furosemide (LASIX) 20 MG tablet Take 1 tablet (20 mg total) by mouth daily.   gabapentin (NEURONTIN) 300 MG capsule Take 1 capsule (300 mg total) by mouth daily as needed (nerve pain, itching).   hydrALAZINE (APRESOLINE) 25 MG tablet Take 1 tablet (25 mg total) by mouth every 8 (eight) hours.   hydrocortisone 2.5 % cream Place 1 application rectally daily as needed (hemorrhoids).   Infant Care Products [facility]) OINT Apply 1 application topically 2 (two) times daily as needed.   Iron-FA-B Cmp-C-Biot-Probiotic (FUSION PLUS) CAPS Take 1 capsule by mouth once daily   loperamide (IMODIUM) 2 MG capsule Take 1 capsule (2 mg total) by mouth 3 (three) times daily as needed for diarrhea or loose stools.   Misc Natural Products (OSTEO BI-FLEX TRIPLE STRENGTH PO) Take 1 tablet by mouth daily.   montelukast (SINGULAIR) 10 MG tablet Take 1 tablet (10 mg total) by mouth at bedtime.   Multiple Vitamin (MULTIVITAMIN ADULT PO) Take by mouth daily.   naltrexone (DEPADE) 50 MG tablet Take 1 tablet (50 mg total) by mouth daily.   omeprazole  (PRILOSEC) 20 MG capsule Take 20 mg by mouth daily as needed (heartburn).   ondansetron (ZOFRAN-ODT) 4 MG disintegrating tablet Take 1 tablet (4 mg total) by mouth every 8 (eight) hours as needed for nausea or vomiting.   polyethylene glycol (MIRALAX / GLYCOLAX) 17 g packet Take 17 g by mouth daily.   Potassium 99 MG TABS Take 1 tablet (99 mg total) by mouth every Monday, Wednesday, and Friday.   Simethicone 180 MG CAPS Take 1 capsule by mouth daily as needed (indigestion).   spironolactone (ALDACTONE) 25 MG tablet Take 1 tablet by mouth daily   SYNTHROID 125 MCG tablet Take 1 tablet (125 mcg total) by mouth daily before breakfast.     Allergies:   Amlodipine, Ace inhibitors, Carvedilol, Chlorthalidone, Losartan, Other, Paroxetine hcl, Risedronate sodium, Simvastatin, Toprol xl [metoprolol], Alendronate sodium, Influenza vaccines, Silenor [doxepin hcl], and Sulfonamide derivatives   Social History   Socioeconomic History   Marital status: Widowed    Spouse name: Not on file   Number of children: 2   Years of education: Not on file   Highest education level: Not on file  Occupational History   Occupation: Retired    Fish farm manager: RETIRED  Tobacco Use   Smoking status: Never   Smokeless tobacco: Never  Vaping Use   Vaping Use: Never used  Substance and Sexual Activity   Alcohol use: No   Drug use: No   Sexual activity: Not Currently  Other Topics Concern   Not on file  Social History Narrative   Left handed   Widow. Husband ([name]) deceased 06-Apr-2016 from dementia. She was caregiver.    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   Activity: no regular exercise   Diet: good water, fruits/vegetables daily      Patient Care Team:   [name], MD as PCP - General (Family Medicine)   Kate Sable, MD as PCP - Cardiology (Cardiology)   Dannielle Karvonen, RN as [place] Management    Gross, Remo Lipps, MD as Consulting Physician (General Surgery)   Lin Landsman, MD as Consulting Physician (Gastroenterology)   Kate Sable, MD as Consulting Physician (Cardiology)   Social Determinants of  Health   Financial Resource Strain: Low Risk  (03/30/2021)   Overall Financial Resource Strain (CARDIA)    Difficulty of Paying Living Expenses: Not very hard  Food Insecurity: No Food Insecurity (03/30/2021)   Hunger Vital Sign    Worried About Running Out of Food in the Last Year: Never true    Ran Out of Food in the Last Year: Never true  Transportation Needs: No Transportation Needs (03/30/2021)   PRAPARE - Hydrologist (Medical): No    Lack of Transportation (Non-Medical): No  Physical Activity: Insufficiently Active (03/30/2021)   Exercise Vital Sign    Days of Exercise per Week: 7 days    Minutes of Exercise per Session: 10 min  Stress: No Stress Concern Present (03/30/2021)   [place]    Feeling of Stress : Only a little  Social Connections: Socially Isolated (03/30/2021)   Social Connection and Isolation Panel [NHANES]    Frequency of Communication with Friends and Family: More than three times a week    Frequency of Social Gatherings with Friends and Family: Three times a week    Attends Religious Services: Never    Active Member of Clubs or Organizations: No    Attends Archivist Meetings: Never    Marital Status: Widowed     Family History: The patient's family history includes Breast cancer in her cousin; Coronary artery disease in her brother; Diabetes in her brother and paternal grandfather.  ROS:   Please see the history of present illness.     All other systems reviewed and are negative.  EKGs/Labs/Other Studies Reviewed:    The following studies were reviewed today:   EKG:  EKG is ordered today.  EKG shows atrial flutter, heart rate  58  Recent Labs: 02/10/2021: Pro B Natriuretic peptide (BNP) 165.0 09/07/2021: TSH 18.420 09/16/2021: Magnesium 1.8 09/25/2021: ALT 32; BUN 24; Creat 1.05; Potassium 4.4; Sodium 139 10/28/2021: Hemoglobin 14.4; Platelets 254  Recent Lipid Panel    Component Value Date/Time   CHOL 179 03/27/2021 0935   TRIG 69 03/27/2021 0935   HDL 71 03/27/2021 0935   CHOLHDL 2.5 03/27/2021 0935   CHOLHDL 3.6 03/17/2020 1519   VLDL 27 03/17/2020 1519   LDLCALC 95 03/27/2021 0935   LDLDIRECT 86.0 09/19/2014 1049    Physical Exam:    VS:  BP 140/80 (BP Location: Left Arm, Patient Position: Sitting, Cuff Size: Normal)   Pulse (!) 58   Ht '5\' 1"'$  (1.549 m)   Wt 146 lb 8 oz (66.5 kg)   SpO2 98%   BMI 27.68 kg/m     Wt Readings from Last 3 Encounters:  11/13/21 146 lb 8 oz (66.5 kg)  11/13/21 146 lb 9.6 oz (66.5 kg)  10/28/21 147 lb (66.7 kg)     GEN:  Well nourished, well developed in no acute distress HEENT: Normal NECK: No JVD; No carotid bruits CARDIAC: Irregular heartbeat, 3/6 systolic murmur RESPIRATORY:  Clear to auscultation without rales, wheezing or rhonchi  ABDOMEN: Soft, non-tender, non-distended MUSCULOSKELETAL: Trace edema; No deformity  SKIN: Warm and dry NEUROLOGIC:  Alert and oriented x 3 PSYCHIATRIC:  Normal affect   ASSESSMENT:    1. Atrial flutter, unspecified type ([place])   2. Primary hypertension   3. Aortic valve stenosis, etiology of cardiac valve disease unspecified    PLAN:    In order of problems listed above:  Atrial flutter, heart rate controlled, largely  asymptomatic.  Continue Coreg, Eliquis, EF 55%.  Plan DC cardioversion.   Hypertension, BP controlled.  Continue carvedilol, hydralazine, Aldactone 25 mg daily. Mild aortic stenosis, unchanged from echocardiogram 1 year ago.  Continue to monitor serially  Follow-up in 6 weeks.  Shared Decision Making/Informed Consent The risks (stroke, cardiac arrhythmias rarely resulting in the need for a temporary or  permanent pacemaker, skin irritation or burns and complications associated with conscious sedation including aspiration, arrhythmia, respiratory failure and death), benefits (restoration of normal sinus rhythm) and alternatives of a direct current cardioversion were explained in detail to Ms. Bram and she agrees to proceed.      Medication Adjustments/Labs and Tests Ordered: Current medicines are reviewed at length with the patient today.  Concerns regarding medicines are outlined above.  Orders Placed This Encounter  Procedures   CBC   Basic metabolic panel   EKG [id number]    No orders of the defined types were placed in this encounter.     Patient Instructions  Medication Instructions:   Your physician recommends that you continue on your current medications as directed. Please refer to the Current Medication list given to you today.  *If you need a refill on your cardiac medications before your next appointment, please call your pharmacy*   Lab Work:  Please go to the [facility] after your appointment today for a lab [facility]) draw.   Testing/Procedures:  You are scheduled for a Cardioversion on __Thursday_7/27/23_____ with Dr.__Agbor-Etang_________ Please arrive at the [place] [facility] at ___0630______ a.m. on the day of your procedure.  DIET INSTRUCTIONS:  Nothing to eat or drink after midnight except your medications with a              sip of water.        Labs: __Drawn after Office Visit 7/21___  Medications:  YOU MAY TAKE ALL of your remaining medications with a small amount of water.  Must have a responsible person to drive you home.  Bring a current list of your medications and current insurance cards.    If you have any questions after you get home, please call the office at [phone]    Follow-Up: At [facility], you and your health needs are our priority.  As part of our continuing mission to provide you with exceptional heart care, we have  created designated Provider Care Teams.  These Care Teams include your primary Cardiologist (physician) and Advanced Practice Providers (APPs -  Physician Assistants and Nurse Practitioners) who all work together to provide you with the care you need, when you need it.  We recommend signing up for the patient portal called "MyChart".  Sign up information is provided on this After Visit Summary.  MyChart is used to connect with patients for Virtual Visits (Telemedicine).  Patients are able to view lab/test results, encounter notes, upcoming appointments, etc.  Non-urgent messages can be sent to your provider as well.   To learn more about what you can do with MyChart, go to [email or web address].    Your next appointment:   6 week(s)  The format for your next appointment:   In Person  Provider:   You may see Kate Sable, MD or one of the following Advanced Practice Providers on your designated Care Team:   Murray Hodgkins, NP Christell Faith, PA-C Cadence Kathlen Mody, [place]    Other Instructions   Important Information About Sugar         Signed, Kate Sable, MD  11/13/2021  12:35 PM     [facility]

## 2021-11-13 NOTE — Telephone Encounter (Signed)
DME was contacted -order is for BiPAP IPAP max 10 and EPAP minimum 5  I could connect download shows 100% usage with daily average usage at 10 hours there is no settings on download.  Does say that average pressure is at 5 and 5 cm H2O. AHI is 14.3 with AHI max at 29.4/hour  Split-night sleep study has been ordered patient needs a baseline hopefully she will be able to have up titration part of the study.  For now would increase BiPAP settings to IPAP max 16, EPAP minimum 8 Need download in 2 weeks

## 2021-11-16 NOTE — Addendum Note (Signed)
Addended by: Brenton Grills on: 8/54/8830 14:15 AM   Modules accepted: Orders

## 2021-11-17 DIAGNOSIS — I5032 Chronic diastolic (congestive) heart failure: Secondary | ICD-10-CM | POA: Diagnosis not present

## 2021-11-18 MED ORDER — APIXABAN 5 MG PO TABS
5.0000 mg | ORAL_TABLET | Freq: Two times a day (BID) | ORAL | 6 refills | Status: DC
Start: 2021-11-18 — End: 2022-06-15

## 2021-11-18 NOTE — Addendum Note (Signed)
Addended by: [name] on: 11/18/2021 07:39 AM   Modules accepted: Orders

## 2021-11-19 ENCOUNTER — Encounter
Admission: RE | Disposition: A | Discharge status: Home / Self Care | Payer: PPO | Source: Home / Self Care | Attending: Cardiology

## 2021-11-19 ENCOUNTER — Ambulatory Visit
Admission: RE | Admit: 2021-11-19 | Discharge: 2021-11-19 | Disposition: A | Discharge status: Home / Self Care | Payer: PPO | Attending: Cardiology | Admitting: Cardiology

## 2021-11-19 ENCOUNTER — Ambulatory Visit: Payer: PPO | Admitting: Certified Registered"

## 2021-11-19 ENCOUNTER — Encounter: Payer: Self-pay | Admitting: Cardiology

## 2021-11-19 DIAGNOSIS — I4892 Unspecified atrial flutter: Secondary | ICD-10-CM | POA: Diagnosis not present

## 2021-11-19 DIAGNOSIS — F419 Anxiety disorder, unspecified: Secondary | ICD-10-CM | POA: Diagnosis not present

## 2021-11-19 DIAGNOSIS — I4819 Other persistent atrial fibrillation: Secondary | ICD-10-CM | POA: Diagnosis not present

## 2021-11-19 DIAGNOSIS — I4891 Unspecified atrial fibrillation: Secondary | ICD-10-CM | POA: Diagnosis not present

## 2021-11-19 DIAGNOSIS — F418 Other specified anxiety disorders: Secondary | ICD-10-CM | POA: Diagnosis not present

## 2021-11-19 DIAGNOSIS — K449 Diaphragmatic hernia without obstruction or gangrene: Secondary | ICD-10-CM | POA: Diagnosis not present

## 2021-11-19 DIAGNOSIS — Z7901 Long term (current) use of anticoagulants: Secondary | ICD-10-CM | POA: Diagnosis not present

## 2021-11-19 DIAGNOSIS — I129 Hypertensive chronic kidney disease with stage 1 through stage 4 chronic kidney disease, or unspecified chronic kidney disease: Secondary | ICD-10-CM | POA: Insufficient documentation

## 2021-11-19 DIAGNOSIS — Z9861 Coronary angioplasty status: Secondary | ICD-10-CM | POA: Diagnosis not present

## 2021-11-19 DIAGNOSIS — M810 Age-related osteoporosis without current pathological fracture: Secondary | ICD-10-CM | POA: Diagnosis not present

## 2021-11-19 DIAGNOSIS — Z01818 Encounter for other preprocedural examination: Secondary | ICD-10-CM | POA: Diagnosis not present

## 2021-11-19 DIAGNOSIS — N183 Chronic kidney disease, stage 3 unspecified: Secondary | ICD-10-CM | POA: Insufficient documentation

## 2021-11-19 DIAGNOSIS — K219 Gastro-esophageal reflux disease without esophagitis: Secondary | ICD-10-CM | POA: Diagnosis not present

## 2021-11-19 DIAGNOSIS — E039 Hypothyroidism, unspecified: Secondary | ICD-10-CM | POA: Diagnosis not present

## 2021-11-19 DIAGNOSIS — I480 Paroxysmal atrial fibrillation: Secondary | ICD-10-CM | POA: Diagnosis not present

## 2021-11-19 HISTORY — PX: CARDIOVERSION: SHX1299

## 2021-11-19 SURGERY — CARDIOVERSION
Anesthesia: General

## 2021-11-19 MED ORDER — PROPOFOL 10 MG/ML IV BOLUS
INTRAVENOUS | Status: AC
  Filled 2021-11-19: qty 20

## 2021-11-19 MED ORDER — SODIUM CHLORIDE 0.9 % IV SOLN: INTRAVENOUS | Status: DC

## 2021-11-19 MED ORDER — PROPOFOL 10 MG/ML IV BOLUS
INTRAVENOUS | Status: DC | PRN
  Administered 2021-11-19: 30 mg via INTRAVENOUS
  Administered 2021-11-19: 10 mg via INTRAVENOUS

## 2021-11-19 NOTE — Anesthesia Preprocedure Evaluation (Signed)
Anesthesia Evaluation  Patient identified by MRN, date of birth, ID band Patient awake    Reviewed: Allergy & Precautions, NPO status , Patient's Chart, lab work & pertinent test results  History of Anesthesia Complications Negative for: history of anesthetic complications  Airway Mallampati: III  TM Distance: >3 FB Neck ROM: Full    Dental  (+) Upper Dentures, Lower Dentures, Dental Advidsory Given   Pulmonary neg pulmonary ROS, neg shortness of breath, neg sleep apnea, neg COPD, neg recent URI, Patient abstained from smoking.Not current smoker,    Pulmonary exam normal breath sounds clear to auscultation       Cardiovascular Exercise Tolerance: Good METShypertension, (-) angina(-) CAD and (-) Past MI + dysrhythmias Atrial Fibrillation + Valvular Problems/Murmurs  Rhythm:Regular Rate:Normal - Systolic murmurs    Neuro/Psych PSYCHIATRIC DISORDERS Anxiety Depression negative neurological ROS     GI/Hepatic hiatal hernia, GERD  Controlled,(+)     (-) substance abuse  ,   Endo/Other  neg diabetesHypothyroidism   Renal/GU Renal InsufficiencyRenal disease     Musculoskeletal   Abdominal   Peds  Hematology   Anesthesia Other Findings Past Medical History: No date: Anxiety 12/22/2017: CAP (community acquired pneumonia) 10/18/2014: Carotid stenosis     Comment:  R 1-39%, L 40-59%, rpt 1 yr (09/2014)  08/02/2014: CKD (chronic kidney disease) stage 3, GFR 30-59 ml/min No date: Clostridioides difficile infection     Comment:  hx 2020 No date: Degenerative disc disease     Comment:  LS No date: Depression     Comment:  nervous breakdonw in 1964-out of work for a year No date: Diverticulosis     Comment:  severe by colonoscopy No date: Family history of adverse reaction to anesthesia     Comment:  n/v No date: GERD (gastroesophageal reflux disease) No date: Glaucoma 5/13: Heart murmur     Comment:  mitral regurge - on  echo  No date: History of hiatal hernia No date: History of shingles No date: HTN (hypertension) No date: Hyperlipidemia No date: Hypertension No date: Hypothyroidism 11/02/2006: IBS No date: Intestinal bacterial overgrowth     Comment:  In small colon 04/08/2012: Maxillary fracture ([place]) 2016: Osteoarthritis     Comment:  Jefm Bryant) No date: Osteoporosis     Comment:  dexa 2011 2016: Pseudogout     Comment:  shoulders ([place]) No date: Pseudogout  Reproductive/Obstetrics                             Anesthesia Physical  Anesthesia Plan  ASA: 3  Anesthesia Plan: General   Post-op Pain Management:    Induction: Intravenous  PONV Risk Score and Plan: 3 and Propofol infusion and TIVA  Airway Management Planned: Nasal Cannula  Additional Equipment: None  Intra-op Plan:   Post-operative Plan:   Informed Consent: I have reviewed the patients History and Physical, chart, labs and discussed the procedure including the risks, benefits and alternatives for the proposed anesthesia with the patient or authorized representative who has indicated his/her understanding and acceptance.     Dental advisory given  Plan Discussed with: CRNA and Surgeon  Anesthesia Plan Comments: (Discussed risks of anesthesia with patient, including possibility of difficulty with spontaneous ventilation under anesthesia necessitating airway intervention, PONV, and rare risks such as cardiac or respiratory or neurological events. Patient understands.)        Anesthesia Quick Evaluation

## 2021-11-19 NOTE — Procedures (Signed)
Cardioversion procedure note For atrial flutter.  Procedure Details:  Consent: Risks of procedure as well as the alternatives and risks of each were explained to the (patient/caregiver).  Consent for procedure obtained.  Time Out: Verified patient identification, verified procedure, site/side was marked, verified correct patient position, special equipment/implants available, medications/allergies/relevent history reviewed, required imaging and test results available.  Performed  Patient placed on cardiac monitor, pulse oximetry, supplemental oxygen as necessary.   Sedation given: propofol IV per anesthesia team Pacer pads placed anterior and posterior chest.   Cardioverted 1 time(s).   Cardioverted at [place]. Synchronized biphasic Converted to NSR   Evaluation: Findings: Post procedure EKG shows: NSR Complications: None Patient did tolerate procedure well.  Time Spent Directly with the Patient:  25 minutes   Kate Sable, M.D.

## 2021-11-19 NOTE — Transfer of Care (Signed)
Immediate Anesthesia Transfer of Care Note  Patient: Brooke Beasley  Procedure(s) Performed: CARDIOVERSION  Patient Location: spu  Anesthesia Type:General  Level of Consciousness: awake  Airway & Oxygen Therapy: Patient Spontanous Breathing and Patient connected to nasal cannula oxygen  Post-op Assessment: Report given to RN and Post -op Vital signs reviewed and stable  Post vital signs: Reviewed  Last Vitals:  Vitals Value Taken Time  BP 106/54 11/19/21 0735  Temp    Pulse 58 11/19/21 0740  Resp 22 11/19/21 0740  SpO2 100 % 11/19/21 0740  Vitals shown include unvalidated device data.  Last Pain:  Vitals:   11/19/21 0707  TempSrc:   PainSc: 0-No pain         Complications: No notable events documented.

## 2021-11-19 NOTE — Interval H&P Note (Signed)
History and Physical Interval Note:  11/19/2021 7:36 AM  Brooke Beasley  has presented today for surgery, with the diagnosis of atrial flutter.  The various methods of treatment have been discussed with the patient and family. After consideration of risks, benefits and other options for treatment, the patient has consented to  Procedure(s): CARDIOVERSION (N/A) as a surgical intervention.  The patient's history has been reviewed, patient examined, no change in status, stable for surgery.  I have reviewed the patient's chart and labs.  Questions were answered to the patient's satisfaction.     Aaron Edelman Agbor-Etang

## 2021-11-21 ENCOUNTER — Other Ambulatory Visit: Payer: Self-pay | Admitting: Family Medicine

## 2021-11-21 NOTE — Anesthesia Postprocedure Evaluation (Signed)
Anesthesia Post Note  Patient: Brooke Beasley  Procedure(s) Performed: CARDIOVERSION  Patient location during evaluation: PACU Anesthesia Type: General Level of consciousness: awake and alert Pain management: pain level controlled Vital Signs Assessment: post-procedure vital signs reviewed and stable Respiratory status: spontaneous breathing, nonlabored ventilation, respiratory function stable and patient connected to nasal cannula oxygen Cardiovascular status: blood pressure returned to baseline and stable Postop Assessment: no apparent nausea or vomiting Anesthetic complications: no   No notable events documented.   Last Vitals:  Vitals:   11/19/21 0831 11/19/21 0836  BP: 111/78 (!) 144/97  Pulse:    Resp: (!) 23 15  Temp:    SpO2:      Last Pain:  Vitals:   11/19/21 0707  TempSrc:   PainSc: 0-No pain                 Martha Clan

## 2021-11-23 NOTE — Telephone Encounter (Signed)
ERx 

## 2021-11-23 NOTE — Telephone Encounter (Signed)
Name of Medication: Clonazepam Name of Pharmacy: [facility] or Written Date and Quantity: 10/28/21, #30 Last Office Visit and Type: 10/09/21, hosp f/u Next Office Visit and Type: 01/01/22, 3 mo f/u Last Controlled Substance Agreement Date: 09/26/14 Last UDS: 09/26/14

## 2021-11-26 NOTE — Progress Notes (Signed)
Reviewed and agree with assessment/plan.   Chesley Mires, MD [facility] Pulmonary/Critical Care 11/26/2021, 9:57 AM Pager:  [phone]

## 2021-12-10 ENCOUNTER — Encounter: Payer: Self-pay | Admitting: Family

## 2021-12-10 ENCOUNTER — Ambulatory Visit: Payer: PPO | Attending: Neurology

## 2021-12-10 ENCOUNTER — Ambulatory Visit (INDEPENDENT_AMBULATORY_CARE_PROVIDER_SITE_OTHER): Payer: PPO | Admitting: Family

## 2021-12-10 VITALS — BP 130/90 | HR 82 | Temp 98.3°F | Resp 16 | Ht 61.0 in | Wt 146.1 lb

## 2021-12-10 DIAGNOSIS — R0683 Snoring: Secondary | ICD-10-CM | POA: Insufficient documentation

## 2021-12-10 DIAGNOSIS — L0889 Other specified local infections of the skin and subcutaneous tissue: Secondary | ICD-10-CM | POA: Insufficient documentation

## 2021-12-10 DIAGNOSIS — L237 Allergic contact dermatitis due to plants, except food: Secondary | ICD-10-CM | POA: Insufficient documentation

## 2021-12-10 DIAGNOSIS — Z6827 Body mass index (BMI) 27.0-27.9, adult: Secondary | ICD-10-CM | POA: Diagnosis not present

## 2021-12-10 MED ORDER — TRIAMCINOLONE ACETONIDE 0.1 % EX CREA: 1.0000 | TOPICAL_CREAM | Freq: Two times a day (BID) | CUTANEOUS | 0 refills | Status: DC

## 2021-12-10 MED ORDER — COLCHICINE 0.6 MG PO TABS: 0.6000 mg | ORAL_TABLET | Freq: Every day | ORAL | 1 refills | Status: AC

## 2021-12-10 MED ORDER — CEPHALEXIN 500 MG PO CAPS: 500.0000 mg | ORAL_CAPSULE | Freq: Three times a day (TID) | ORAL | 0 refills | Status: AC

## 2021-12-10 NOTE — Assessment & Plan Note (Signed)
rx triamcinolone cream Hesitant for RX prednisone due to recent cardioversion from AFIB now in NSR and pt states prednisone makes he heart race. Continue with allegra.  Try not to itch to avoid spreading.  Calamine lotion prn.

## 2021-12-10 NOTE — Assessment & Plan Note (Signed)
rx cephalexin 500 mg po tid x 10 days Please monitor site for worsening signs/symptoms of infection to include: increasing redness, increasing tenderness, increase in size, and or pustulant drainage from site. If this is to occur please let me know immediately.

## 2021-12-10 NOTE — Progress Notes (Signed)
Established Patient Office Visit  Subjective:  Patient ID: Brooke Beasley, female    DOB: 02/03/1931  Age: 88 y.o. MRN: [id number]  CC:  Chief Complaint  Patient presents with   Poison Ivy    HPI Rim Thatch is here today with concerns.     Past Medical History:  Diagnosis Date   Anxiety    C. difficile diarrhea 07/11/2018   S/p multiple oral vanc treatments (course and tapers) and completed Zinplava monoclonal Ab treatment (05/10/2019) Fecal transplant on hold during COVID pandemic   CAP (community acquired pneumonia) 12/22/2017   Carotid stenosis 10/18/2014   R 1-39%, L 40-59%, rpt 1 yr (09/2014)    CKD (chronic kidney disease) stage 3, GFR 30-59 ml/min (HCC) 08/02/2014   Closed fracture of right orbit ([place]) 02/10/2021   Clostridioides difficile infection    hx 2020   COVID-19 virus infection 10/13/2020   Degenerative disc disease    LS   Depression    nervous breakdonw in 1964-out of work for a year   Diverticulosis    severe by colonoscopy   Family history of adverse reaction to anesthesia    n/v   GERD (gastroesophageal reflux disease)    Glaucoma    Heart murmur 08/2011   mitral regurge - on echo    History of hiatal hernia    History of shingles    HTN (hypertension)    Hyperlipidemia    Hypertension    Hypothyroidism    IBS 11/02/2006   Intestinal bacterial overgrowth    In small colon   Left shoulder pain 11/04/2014   Lower GI bleed 12/2020   thought diverticular complicated by ABLA with syncope and orbital fracture s/p hospitalization   Maxillary fracture (Brooke Beasley) 04/08/2012   Osteoarthritis 2016   Brooke Beasley)   Osteoporosis    dexa 2011   Perirectal abscess 08/03/2021   CT guided aspiration of abscess grow Proteums mirabilis and Citrobacter koseri 07/2021, currently receiving IV Zosyn planned total 6 wks.   Pseudogout 2016   shoulders ([place])    Past Surgical History:  Procedure Laterality Date   barium enema  2012   severe  diverticulosis, redundant colon   Bowel obstruction  1999   no surgery in hosp x 3 days   BUBBLE STUDY  07/27/2021   Procedure: BUBBLE STUDY;  Surgeon: Dixie Dials, MD;  Location: [facility];  Service: Cardiovascular;;   CARDIOVERSION N/A 11/19/2021   Procedure: CARDIOVERSION;  Surgeon: Brooke Sable, MD;  Location: [facility];  Service: Cardiovascular;  Laterality: N/A;   COLONOSCOPY  1. 1999  2. 11/04   1. Not finished  2. Slight hemorrhage rectosigmoid area, severe sig diverticulosis   COLONOSCOPY WITH PROPOFOL N/A 09/10/2019   SSP with dysplasia, TA, rpt 3 yrs Brooke Beasley, Brooke Due, MD)   Dexa  1. 804 [id number]   2. 9/04  3. 3/08    1. OP  2. OP, borderline, spine -2.44T  3. decreased BMD-OP   DG KNEE 1-2 VIEWS BILAT     LS x-ray with degenerative disc and facet change   ESOPHAGOGASTRODUODENOSCOPY     Negative   FLEXIBLE SIGMOIDOSCOPY N/A 01/16/2021   Procedure: FLEXIBLE SIGMOIDOSCOPY;  Surgeon: Brooke Mayer, MD;  Location: [facility];  Service: Endoscopy;  Laterality: N/A;  or unsedated   Hemorrhoid procedure  07/2006   PICC LINE REMOVAL ([place] HX)  08/28/2021   PROCTOSCOPY N/A 05/26/2021   Procedure: RIGID PROCTOSCOPY;  Surgeon: Brooke Boston, MD;  Location: [facility];  Service: General;  Laterality: N/A;   RECTOPEXY N/A 05/26/2021   Procedure: RECTOPEXY;  Surgeon: Brooke Boston, MD;  Location: [facility];  Service: General;  Laterality: N/A;   TEE WITHOUT CARDIOVERSION N/A 07/27/2021   Procedure: TRANSESOPHAGEAL ECHOCARDIOGRAM (TEE);  Surgeon: Dixie Dials, MD;  Location: [facility];  Service: Cardiovascular;  Laterality: N/A;   TONSILLECTOMY     TOTAL SHOULDER ARTHROPLASTY Left 11/27/2015   Brooke Mull, MD   TOTAL SHOULDER REVISION Left 03/16/2016   Procedure: TOTAL SHOULDER REVISION;  Surgeon: Brooke Mull, MD;  Location: [facility];  Service: Orthopedics;  Laterality: Left;   US ECHOCARDIOGRAPHY  [id number]/8921   Normal systolic fxn with EF 19-41%.  Focal basal septal hypertrophy.   Mild diastolic dysfunction.  Mild MR.   XI ROBOTIC ASSISTED LOWER ANTERIOR RESECTION N/A 05/26/2021   Procedure: ROBOTIC LOW ANTERIOR RECTOSIGMOID RESECTION; ASSESSMENT OF TISSUE PERFUSION WITH FIREFLY;  Surgeon: Brooke Boston, MD;  Location: [facility];  Service: General;  Laterality: N/A;    Family History  Problem Relation Age of Onset   Diabetes Brother        post-op   Coronary artery disease Brother    Diabetes Paternal Grandfather    Breast cancer Cousin     Social History   Socioeconomic History   Marital status: Widowed    Spouse name: Not on file   Number of children: 2   Years of education: Not on file   Highest education level: Not on file  Occupational History   Occupation: Retired    Fish farm manager: RETIRED  Tobacco Use   Smoking status: Never   Smokeless tobacco: Never  Vaping Use   Vaping Use: Never used  Substance and Sexual Activity   Alcohol use: No   Drug use: No   Sexual activity: Not Currently  Other Topics Concern   Not on file  Social History Narrative   Left handed   Widow. Husband (Brooke Beasley) deceased 04/20/16 from dementia. She was caregiver.    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   Activity: no regular exercise   Diet: good water, fruits/vegetables daily      Patient Care Team:   [name], MD as PCP - General (Family Medicine)   Brooke Sable, MD as PCP - Cardiology (Cardiology)   Dannielle Karvonen, RN as [place] Management   Gross, Remo Lipps, MD as Consulting Physician (General Surgery)   Lin Landsman, MD as Consulting Physician (Gastroenterology)   Brooke Sable, MD as Consulting Physician (Cardiology)   Social Determinants of Health   Financial Resource Strain: Low Risk  (03/30/2021)   Overall Financial Resource Strain (CARDIA)    Difficulty of Paying Living Expenses: Not very hard  Food Insecurity: No Food Insecurity (03/30/2021)    Hunger Vital Sign    Worried About Running Out of Food in the Last Year: Never true    [place] in the Last Year: Never true  Transportation Needs: No Transportation Needs (03/30/2021)   PRAPARE - Transportation    Lack of Transportation (Medical): No    Lack of Transportation (Non-Medical): No  Physical Activity: Insufficiently Active (03/30/2021)   Exercise Vital Sign    Days of Exercise per Week: 7 days    Minutes of Exercise per Session: 10 min  Stress: No Stress Concern Present (03/30/2021)   [place]  Feeling of Stress : Only a little  Social Connections: Socially Isolated (03/30/2021)   Social Connection and Isolation Panel [NHANES]    Frequency of Communication with Friends and Family: More than three times a week    Frequency of Social Gatherings with Friends and Family: Three times a week    Attends Religious Services: Never    Active Member of Clubs or Organizations: No    Attends Archivist Meetings: Never    Marital Status: Widowed  Intimate Partner Violence: Not At Risk (03/30/2021)   Humiliation, Afraid, Rape, and Kick questionnaire    Fear of Current or Ex-Partner: No    Emotionally Abused: No    Physically Abused: No    Sexually Abused: No    Outpatient Medications Prior to Visit  Medication Sig Dispense Refill   acetaminophen (TYLENOL) 500 MG tablet Take 500-1,000 mg by mouth every 6 (six) hours as needed for moderate pain. Depends on pain     albuterol (PROVENTIL HFA;VENTOLIN HFA) 108 (90 Base) MCG/ACT inhaler Inhale 2 puffs into the lungs every 6 (six) hours as needed for wheezing or shortness of breath. 1 Inhaler 0   ALPHAGAN P 0.1 % SOLN Place 1 drop into both eyes daily.     apixaban (ELIQUIS) 5 MG TABS tablet Take 1 tablet (5 mg total) by mouth 2 (two) times daily. 60 tablet 6   bimatoprost (LUMIGAN) 0.01 % SOLN Place 1 drop into both eyes at bedtime.     bisacodyl  (DULCOLAX) 5 MG EC tablet Take 1 tablet (5 mg total) by mouth daily as needed for moderate constipation. 30 tablet 1   carvedilol (COREG) 6.25 MG tablet Take 1 tablet (6.25 mg total) by mouth 2 (two) times daily with a meal. 180 tablet 1   cholecalciferol (VITAMIN D) 1000 units tablet Take 1,000 Units by mouth daily.     clonazePAM (KLONOPIN) 0.5 MG tablet TAKE 1 TABLET BY MOUTH AT BEDTIME 30 tablet 0   diclofenac Sodium (VOLTAREN) 1 % GEL Apply 1 application  topically 3 (three) times daily as needed.     Famotidine-Ca Carb-Mag Hydrox (PEPCID COMPLETE PO) Take 1 tablet by mouth daily as needed (indigestion).     furosemide (LASIX) 20 MG tablet Take 1 tablet (20 mg total) by mouth daily. 30 tablet 6   gabapentin (NEURONTIN) 300 MG capsule Take 1 capsule (300 mg total) by mouth daily as needed (nerve pain, itching). 30 capsule 3   hydrALAZINE (APRESOLINE) 25 MG tablet Take 1 tablet (25 mg total) by mouth every 8 (eight) hours. 90 tablet 0   hydrocortisone 2.5 % cream Place 1 application rectally daily as needed (hemorrhoids).     Infant Care Products [facility]) OINT Apply 1 application topically 2 (two) times daily as needed. 430 g 0   loperamide (IMODIUM) 2 MG capsule Take 1 capsule (2 mg total) by mouth 3 (three) times daily as needed for diarrhea or loose stools. 30 capsule 0   Misc Natural Products (OSTEO BI-FLEX TRIPLE STRENGTH PO) Take 1 tablet by mouth daily.     Multiple Vitamin (MULTIVITAMIN ADULT PO) Take by mouth daily.     polyethylene glycol (MIRALAX / GLYCOLAX) 17 g packet Take 17 g by mouth daily.     Potassium 99 MG TABS Take 1 tablet (99 mg total) by mouth every Monday, Wednesday, and Friday. 330 tablet    Simethicone 180 MG CAPS Take 1 capsule by mouth daily as needed (indigestion).     spironolactone (ALDACTONE)  25 MG tablet Take 1 tablet by mouth daily 90 tablet 2   SYNTHROID 125 MCG tablet Take 1 tablet (125 mcg total) by mouth daily before breakfast. 90 tablet 1   colchicine  0.6 MG tablet Take 1 tablet (0.6 mg total) by mouth daily as needed (gout flare). (Patient taking differently: Take 0.6 mg by mouth daily.) 90 tablet 0   ferrous sulfate 325 (65 FE) MG tablet Take 1 tablet (325 mg total) by mouth 2 (two) times daily with a meal. (Patient not taking: Reported on 12/10/2021) 60 tablet 3   Iron-FA-B Cmp-C-Biot-Probiotic (FUSION PLUS) CAPS Take 1 capsule by mouth once daily (Patient not taking: Reported on 12/10/2021) 30 capsule 2   montelukast (SINGULAIR) 10 MG tablet Take 1 tablet (10 mg total) by mouth at bedtime. (Patient not taking: Reported on 12/10/2021) 30 tablet 0   naltrexone (DEPADE) 50 MG tablet Take 1 tablet (50 mg total) by mouth daily. (Patient not taking: Reported on 12/10/2021) 30 tablet 0   omeprazole (PRILOSEC) 20 MG capsule Take 20 mg by mouth daily as needed (heartburn). (Patient not taking: Reported on 12/10/2021)     ondansetron (ZOFRAN-ODT) 4 MG disintegrating tablet Take 1 tablet (4 mg total) by mouth every 8 (eight) hours as needed for nausea or vomiting. (Patient not taking: Reported on 12/10/2021) 20 tablet 0   No facility-administered medications prior to visit.    Allergies  Allergen Reactions   Amlodipine Other (See Comments)    Stomach pains--"feel like my insides are on fire"   Ace Inhibitors Cough   Carvedilol Other (See Comments)    fatigue   Chlorthalidone Other (See Comments)    Severe hyponatremia   Losartan Other (See Comments)    Cr bumped   Other Other (See Comments)    No seeds or corn because of IBS   Paroxetine Hcl Other (See Comments)    Not effective - felt ill on this medicine   Risedronate Sodium     REACTION: joint pain   Simvastatin     REACTION: the full 20 mg pill causes leg pain- can tol 10 mg   Toprol Xl [Metoprolol] Diarrhea    Diarrhea and weakness   Alendronate Sodium Palpitations   Influenza Vaccines Rash and Other (See Comments)    Led to mental breakdown in 1960s    Silenor [Doxepin Hcl]  Palpitations    Hallucinations and racing heart   Sulfonamide Derivatives Rash        Objective:    Physical Exam Constitutional:      General: She is not in acute distress.    Appearance: Normal appearance. She is normal weight. She is not ill-appearing, toxic-appearing or diaphoretic.  Cardiovascular:     Rate and Rhythm: Normal rate and regular rhythm.     Heart sounds: Murmur heard.  Pulmonary:     Effort: Pulmonary effort is normal.  Skin:    Findings: Rash (left inner elbow with abrasion with surrounding erythema and slight warmth to site.) present.     Comments: Mild patch of small raised red erythema right upper shoulder.    Neurological:     Mental Status: She is alert.       BP (!) 130/90   Pulse 82   Temp 98.3 F (36.8 C)   Resp 16   Ht 5' 1" (1.549 m)   Wt 146 lb 2 oz (66.3 kg)   SpO2 97%   BMI 27.61 kg/m  Wt Readings from Last 3 Encounters:  12/10/21 146 lb 2 oz (66.3 kg)  11/19/21 148 lb (67.1 kg)  11/13/21 146 lb 8 oz (66.5 kg)     Health Maintenance Beasley  Topic Date Beasley   Zoster Vaccines- Shingrix (1 of 2) Never done   TETANUS/TDAP  10/23/2018   COVID-19 Vaccine (3 - Pfizer risk series) 09/28/2019   MAMMOGRAM  08/22/2021   INFLUENZA VACCINE  11/24/2021    There are no preventive care reminders to display for this patient.  Lab Results  Component Value Date   TSH 18.420 (H) 09/07/2021   Lab Results  Component Value Date   WBC 6.5 11/13/2021   HGB 14.5 11/13/2021   HCT 45.4 11/13/2021   MCV 91.2 11/13/2021   PLT 245 11/13/2021   Lab Results  Component Value Date   NA 138 11/13/2021   K 4.0 11/13/2021   CO2 30 11/13/2021   GLUCOSE 109 (H) 11/13/2021   BUN 27 (H) 11/13/2021   CREATININE 1.22 (H) 11/13/2021   BILITOT 0.6 09/25/2021   ALKPHOS 85 09/16/2021   AST 37 (H) 09/25/2021   ALT 32 (H) 09/25/2021   PROT 7.4 09/25/2021   ALBUMIN 2.6 (L) 09/16/2021   CALCIUM 10.2 11/13/2021   ANIONGAP 7 11/13/2021   EGFR 50 (L)  08/26/2021   GFR 47.45 (L) 05/14/2021   Lab Results  Component Value Date   HGBA1C 5.6 05/22/2021      Assessment & Plan:   Problem List Items Addressed This Visit       Musculoskeletal and Integument   Secondary infection of skin    rx cephalexin 500 mg po tid x 10 days Please monitor site for worsening signs/symptoms of infection to include: increasing redness, increasing tenderness, increase in size, and or pustulant drainage from site. If this is to occur please let me know immediately.        Relevant Medications   cephALEXin (KEFLEX) 500 MG capsule   Poison ivy dermatitis - Primary    rx triamcinolone cream Hesitant for RX prednisone [place] to recent cardioversion from AFIB now in NSR and pt states prednisone makes he heart race. Continue with allegra.  Try not to itch to avoid spreading.  Calamine lotion prn.         Relevant Medications   triamcinolone cream (KENALOG) 0.1 %    Meds ordered this encounter  Medications   colchicine 0.6 MG tablet    Sig: Take 1 tablet (0.6 mg total) by mouth daily.    Dispense:  90 tablet    Refill:  1    Order Specific Question:   Supervising Provider    Answer:   BEDSOLE, AMY E [[id number]]   triamcinolone cream (KENALOG) 0.1 %    Sig: Apply 1 Application topically 2 (two) times daily.    Dispense:  30 g    Refill:  0    Order Specific Question:   Supervising Provider    Answer:   BEDSOLE, AMY E [[id number]]   cephALEXin (KEFLEX) 500 MG capsule    Sig: Take 1 capsule (500 mg total) by mouth 3 (three) times daily for 10 days.    Dispense:  30 capsule    Refill:  0    Order Specific Question:   Supervising Provider    Answer:   Diona Browner, AMY E [[id number]]    Follow-up: Return in about 1 week (around 12/17/2021) for with Dr. Danise Mina for follow up on rash .    Eugenia Pancoast, FNP

## 2021-12-14 ENCOUNTER — Encounter: Payer: Self-pay | Admitting: Family Medicine

## 2021-12-14 MED ORDER — PREDNISONE 20 MG PO TABS: ORAL_TABLET | ORAL | 0 refills | Status: DC

## 2021-12-17 ENCOUNTER — Telehealth (INDEPENDENT_AMBULATORY_CARE_PROVIDER_SITE_OTHER): Payer: PPO | Admitting: Pulmonary Disease

## 2021-12-17 DIAGNOSIS — G4733 Obstructive sleep apnea (adult) (pediatric): Secondary | ICD-10-CM

## 2021-12-17 DIAGNOSIS — R531 Weakness: Secondary | ICD-10-CM | POA: Diagnosis not present

## 2021-12-17 NOTE — Telephone Encounter (Signed)
Split study -during baseline, RDI was 15/hour CPAP titrated to 11 cm Since she already has BiPAP, can be set at 11/8 cm

## 2021-12-18 ENCOUNTER — Encounter: Payer: Self-pay | Admitting: Family Medicine

## 2021-12-18 ENCOUNTER — Ambulatory Visit (INDEPENDENT_AMBULATORY_CARE_PROVIDER_SITE_OTHER): Payer: PPO | Admitting: Family Medicine

## 2021-12-18 VITALS — BP 124/84 | HR 69 | Temp 97.5°F | Ht 61.0 in | Wt 149.2 lb

## 2021-12-18 DIAGNOSIS — L299 Pruritus, unspecified: Secondary | ICD-10-CM

## 2021-12-18 DIAGNOSIS — E039 Hypothyroidism, unspecified: Secondary | ICD-10-CM | POA: Diagnosis not present

## 2021-12-18 DIAGNOSIS — L237 Allergic contact dermatitis due to plants, except food: Secondary | ICD-10-CM | POA: Diagnosis not present

## 2021-12-18 DIAGNOSIS — I5032 Chronic diastolic (congestive) heart failure: Secondary | ICD-10-CM | POA: Diagnosis not present

## 2021-12-18 DIAGNOSIS — D508 Other iron deficiency anemias: Secondary | ICD-10-CM

## 2021-12-18 LAB — COMPREHENSIVE METABOLIC PANEL
ALT: 29 U/L (ref 0–35)
AST: 27 U/L (ref 0–37)
Albumin: 4 g/dL (ref 3.5–5.2)
Alkaline Phosphatase: 88 U/L (ref 39–117)
BUN: 31 mg/dL — ABNORMAL HIGH (ref 6–23)
CO2: 30 mEq/L (ref 19–32)
Calcium: 10.4 mg/dL (ref 8.4–10.5)
Chloride: 100 mEq/L (ref 96–112)
Creatinine, Ser: 1.04 mg/dL (ref 0.40–1.20)
GFR: 47.8 mL/min — ABNORMAL LOW (ref >60.00)
Glucose, Bld: 142 mg/dL — ABNORMAL HIGH (ref 70–99)
Potassium: 4.1 mEq/L (ref 3.5–5.1)
Sodium: 139 mEq/L (ref 135–145)
Total Bilirubin: 0.7 mg/dL (ref 0.2–1.2)
Total Protein: 6.6 g/dL (ref 6.0–8.3)

## 2021-12-18 LAB — CBC WITH DIFFERENTIAL/PLATELET
Basophils Absolute: 0 10*3/uL (ref 0.0–0.1)
Basophils Relative: 0.2 % (ref 0.0–3.0)
Eosinophils Absolute: 0.1 10*3/uL (ref 0.0–0.7)
Eosinophils Relative: 1.4 % (ref 0.0–5.0)
HCT: 41.9 % (ref 36.0–46.0)
Hemoglobin: 14 g/dL (ref 12.0–15.0)
Lymphocytes Relative: 14.1 % (ref 12.0–46.0)
Lymphs Abs: 1 10*3/uL (ref 0.7–4.0)
MCHC: 33.4 g/dL (ref 30.0–36.0)
MCV: 91.8 fl (ref 78.0–100.0)
Monocytes Absolute: 0.4 10*3/uL (ref 0.1–1.0)
Monocytes Relative: 5.6 % (ref 3.0–12.0)
Neutro Abs: 5.8 10*3/uL (ref 1.4–7.7)
Neutrophils Relative %: 78.7 % — ABNORMAL HIGH (ref 43.0–77.0)
Platelets: 260 10*3/uL (ref 150.0–400.0)
RBC: 4.56 Mil/uL (ref 3.87–5.11)
RDW: 17.1 % — ABNORMAL HIGH (ref 11.5–15.5)
WBC: 7.3 10*3/uL (ref 4.0–10.5)

## 2021-12-18 LAB — SEDIMENTATION RATE: Sed Rate: 29 mm/hr (ref 0–30)

## 2021-12-18 LAB — TSH: TSH: 0.08 u[IU]/mL — ABNORMAL LOW (ref 0.35–5.50)

## 2021-12-18 LAB — IBC PANEL
Iron: 128 ug/dL (ref 42–145)
Saturation Ratios: 33.4 % (ref 20.0–50.0)
TIBC: 383.6 ug/dL (ref 250.0–450.0)
Transferrin: 274 mg/dL (ref 212.0–360.0)

## 2021-12-18 LAB — FERRITIN: Ferritin: 81.5 ng/mL (ref 10.0–291.0)

## 2021-12-18 LAB — C-REACTIVE PROTEIN: CRP: <1 mg/dL (ref 0.5–20.0)

## 2021-12-18 MED ORDER — CLOBETASOL PROPIONATE 0.05 % EX CREA: 1.0000 | TOPICAL_CREAM | Freq: Two times a day (BID) | CUTANEOUS | 0 refills | Status: DC

## 2021-12-18 NOTE — Progress Notes (Signed)
Patient ID: Brooke Beasley, female    DOB: 03/06/1931, 88 y.o.   MRN: [id number]  This visit was conducted in person.  BP 124/84   Pulse 69   Temp (!) 97.5 F (36.4 C) (Temporal)   Ht '5\' 1"'$  (1.549 m)   Wt 149 lb 4 oz (67.7 kg)   SpO2 95%   BMI 28.20 kg/m    CC: 1 wk f/u visit  Subjective:   HPI: Brooke Beasley is a 88 y.o. female presenting on 12/18/2021 for Rash (Here for 1 wk f/u.  Pt accompanied by daughter, Brooke Beasley. )   Evaluated last week by Lawerance Bach NP with skin rash thought consistent with poison ivy dermatitis, treated with topical triamcinolone cream, continue Allegra as needed there was also concern for secondary bacterial infection so she was placed on Keflex 500 3 times daily 10-day course.  Due to ongoing rash itching and discomfort, oral prednisone '40mg'$  taper was sent in via MyChart on August 21.  S/p cardioversion 11/19/2021 for atrial flutter.  She has also been seeing pulmonology for sleep apnea, planned BiPAP at 11/8cm setting along with Oxygen at 4L/min at night.   Chronic pruritis - ongoing, referred by ID to allergist, appt end of 01/2022     Relevant past medical, surgical, family and social history reviewed and updated as indicated. Interim medical history since our last visit reviewed. Allergies and medications reviewed and updated. Outpatient Medications Prior to Visit  Medication Sig Dispense Refill   acetaminophen (TYLENOL) 500 MG tablet Take 500-1,000 mg by mouth every 6 (six) hours as needed for moderate pain. Depends on pain     albuterol (PROVENTIL HFA;VENTOLIN HFA) 108 (90 Base) MCG/ACT inhaler Inhale 2 puffs into the lungs every 6 (six) hours as needed for wheezing or shortness of breath. 1 Inhaler 0   ALPHAGAN P 0.1 % SOLN Place 1 drop into both eyes daily.     apixaban (ELIQUIS) 5 MG TABS tablet Take 1 tablet (5 mg total) by mouth 2 (two) times daily. 60 tablet 6   bimatoprost (LUMIGAN) 0.01 % SOLN Place 1 drop into both eyes  at bedtime.     bisacodyl (DULCOLAX) 5 MG EC tablet Take 1 tablet (5 mg total) by mouth daily as needed for moderate constipation. 30 tablet 1   carvedilol (COREG) 6.25 MG tablet Take 1 tablet (6.25 mg total) by mouth 2 (two) times daily with a meal. 180 tablet 1   cephALEXin (KEFLEX) 500 MG capsule Take 1 capsule (500 mg total) by mouth 3 (three) times daily for 10 days. 30 capsule 0   cholecalciferol (VITAMIN D) 1000 units tablet Take 1,000 Units by mouth daily.     clonazePAM (KLONOPIN) 0.5 MG tablet TAKE 1 TABLET BY MOUTH AT BEDTIME 30 tablet 0   colchicine 0.6 MG tablet Take 1 tablet (0.6 mg total) by mouth daily. 90 tablet 1   diclofenac Sodium (VOLTAREN) 1 % GEL Apply 1 application  topically 3 (three) times daily as needed.     Famotidine-Ca Carb-Mag Hydrox (PEPCID COMPLETE PO) Take 1 tablet by mouth daily as needed (indigestion).     Ferrous Sulfate (IRON PO) Take by mouth daily.     fexofenadine (ALLEGRA) 180 MG tablet Take 180 mg by mouth daily.     furosemide (LASIX) 20 MG tablet Take 1 tablet (20 mg total) by mouth daily. 30 tablet 6   gabapentin (NEURONTIN) 300 MG capsule Take 1 capsule (300 mg total) by mouth daily as  needed (nerve pain, itching). 30 capsule 3   hydrALAZINE (APRESOLINE) 25 MG tablet Take 1 tablet (25 mg total) by mouth every 8 (eight) hours. 90 tablet 0   hydrocortisone 2.5 % cream Place 1 application rectally daily as needed (hemorrhoids).     Infant Care Products [facility]) OINT Apply 1 application topically 2 (two) times daily as needed. 430 g 0   loperamide (IMODIUM) 2 MG capsule Take 1 capsule (2 mg total) by mouth 3 (three) times daily as needed for diarrhea or loose stools. 30 capsule 0   Misc Natural Products (OSTEO BI-FLEX TRIPLE STRENGTH PO) Take 1 tablet by mouth daily.     Multiple Vitamin (MULTIVITAMIN ADULT PO) Take by mouth daily.     polyethylene glycol (MIRALAX / GLYCOLAX) 17 g packet Take 17 g by mouth daily.     Potassium 99 MG TABS Take 1  tablet (99 mg total) by mouth every Monday, Wednesday, and Friday. 330 tablet    predniSONE (DELTASONE) 20 MG tablet Take two tablets daily for 3 days followed by one tablet daily for 3 days 9 tablet 0   Simethicone 180 MG CAPS Take 1 capsule by mouth daily as needed (indigestion).     spironolactone (ALDACTONE) 25 MG tablet Take 1 tablet by mouth daily 90 tablet 2   SYNTHROID 125 MCG tablet Take 1 tablet (125 mcg total) by mouth daily before breakfast. 90 tablet 1   vitamin B-12 (CYANOCOBALAMIN) 100 MCG tablet Take 100 mcg by mouth daily.     triamcinolone cream (KENALOG) 0.1 % Apply 1 Application topically 2 (two) times daily. 30 g 0   No facility-administered medications prior to visit.     Per HPI unless specifically indicated in ROS section below Review of Systems  Objective:  BP 124/84   Pulse 69   Temp (!) 97.5 F (36.4 C) (Temporal)   Ht '5\' 1"'$  (1.549 m)   Wt 149 lb 4 oz (67.7 kg)   SpO2 95%   BMI 28.20 kg/m   Wt Readings from Last 3 Encounters:  12/18/21 149 lb 4 oz (67.7 kg)  12/10/21 146 lb 2 oz (66.3 kg)  11/19/21 148 lb (67.1 kg)      Physical Exam Vitals and nursing note reviewed.  Constitutional:      Appearance: Normal appearance. She is not ill-appearing.  Cardiovascular:     Rate and Rhythm: Normal rate and regular rhythm.     Pulses: Normal pulses.     Heart sounds: Murmur (3/6 systolic throughout) heard.  Musculoskeletal:        General: Normal range of motion.     Right lower leg: No edema.     Left lower leg: No edema.  Skin:    General: Skin is warm and dry.     Findings: Erythema and rash present.          Comments: Pruritic faintly erythematous rash to left AC fossa with central excoriations (improved)  Neurological:     Mental Status: She is alert.  Psychiatric:        Mood and Affect: Mood normal.        Behavior: Behavior normal.         Results for orders placed or performed during the hospital encounter of 56/38/75  Basic  metabolic panel  Result Value Ref Range   Sodium 138 135 - 145 mmol/L   Potassium 4.0 3.5 - 5.1 mmol/L   Chloride 101 98 - 111 mmol/L   CO2 30  22 - 32 mmol/L   Glucose, Bld 109 (H) 70 - 99 mg/dL   BUN 27 (H) 8 - 23 mg/dL   Creatinine, Ser 1.22 (H) 0.44 - 1.00 mg/dL   Calcium 10.2 8.9 - 10.3 mg/dL   GFR, Estimated 43 (L) >60 mL/min   Anion gap 7 5 - 15  CBC  Result Value Ref Range   WBC 6.5 4.0 - 10.5 K/uL   RBC 4.98 3.87 - 5.11 MIL/uL   Hemoglobin 14.5 12.0 - 15.0 g/dL   HCT 45.4 36.0 - 46.0 %   MCV 91.2 80.0 - 100.0 fL   MCH 29.1 26.0 - 34.0 pg   MCHC 31.9 30.0 - 36.0 g/dL   RDW 16.5 (H) 11.5 - 15.5 %   Platelets 245 150 - 400 K/uL   nRBC 0.0 0.0 - 0.2 %    Assessment & Plan:   Problem List Items Addressed This Visit     Hypothyroidism    Due for updated TSH on higher Synthroid dose (110mg).       Iron deficiency anemia    Update levels on oral iron replacement - she is now taking slow release oral iron supplement daily       Relevant Medications   Ferrous Sulfate (IRON PO)   vitamin B-12 (CYANOCOBALAMIN) 100 MCG tablet   Pruritus    Longstanding pruritis, seemed to improve with previous changes made (to brand Synthroid, stopping sertraline, continuing gabapentin and oral antihistamine + pepcid).  However now recurrent (concomitantly with skin rash presumed due to poison ivy. Plan was to do further labwork assessment of chronic pruritis - will proceed with this.  With h/o IDA, check for celiac disease.       Poison ivy dermatitis - Primary    Presumed poison ivy dermatitis with persistent pruritic erythematous rash after keflex course with topical steroids, so I prescribed oral prednisone taper - they have noted significant improvement since starting oral prednisone. Felt rash worsened with triamcinolone cream. Will try clobetasol topically. Discussed further topical care measures. Update if not improved with above.         Meds ordered this encounter   Medications   clobetasol cream (TEMOVATE) 0.05 %    Sig: Apply 1 Application topically 2 (two) times daily.    Dispense:  30 g    Refill:  0   No orders of the defined types were placed in this encounter.    Patient Instructions  Finish prednisone course.  Try stronger steroid cream onto skin, may use vaseline on top of this to increase potency of topical steroid. Steroid topically no more than 1 week at a time.  Continue pepcid and allergy medicine daily.  Labs today.  Keep next month's appointment.  Follow up plan: Return if symptoms worsen or fail to improve.  JRia Bush MD

## 2021-12-18 NOTE — Assessment & Plan Note (Signed)
Presumed poison ivy dermatitis with persistent pruritic erythematous rash after keflex course with topical steroids, so I prescribed oral prednisone taper - they have noted significant improvement since starting oral prednisone. Felt rash worsened with triamcinolone cream. Will try clobetasol topically. Discussed further topical care measures. Update if not improved with above.

## 2021-12-18 NOTE — Patient Instructions (Addendum)
Finish prednisone course.  Try stronger steroid cream onto skin, may use vaseline on top of this to increase potency of topical steroid. Steroid topically no more than 1 week at a time.  Continue pepcid and allergy medicine daily.  Labs today.  Keep next month's appointment.

## 2021-12-18 NOTE — Assessment & Plan Note (Signed)
Longstanding pruritis, seemed to improve with previous changes made (to brand Synthroid, stopping sertraline, continuing gabapentin and oral antihistamine + pepcid).  However now recurrent (concomitantly with skin rash presumed due to poison ivy. Plan was to do further labwork assessment of chronic pruritis - will proceed with this.  With h/o IDA, check for celiac disease.

## 2021-12-18 NOTE — Assessment & Plan Note (Addendum)
Update levels on oral iron replacement - she is now taking slow release oral iron supplement daily

## 2021-12-18 NOTE — Assessment & Plan Note (Addendum)
Due for updated TSH on higher Synthroid dose (135mg).

## 2021-12-19 ENCOUNTER — Other Ambulatory Visit: Payer: Self-pay | Admitting: Family Medicine

## 2021-12-20 ENCOUNTER — Other Ambulatory Visit: Payer: Self-pay | Admitting: Family Medicine

## 2021-12-20 MED ORDER — LEVOTHYROXINE SODIUM 112 MCG PO TABS: 112.0000 ug | ORAL_TABLET | Freq: Every day | ORAL | 1 refills | Status: DC

## 2021-12-21 NOTE — Telephone Encounter (Signed)
Split-night sleep study showed moderate sleep apnea with a RDI at 15/hour.  She already has a BiPAP.  Please set her BiPAP to IPAP 11 cm H2O.  And EPAP at 8 cm H2O. Please make sure she keeps her office visit in 2 months for evaluation and BiPAP download.

## 2021-12-21 NOTE — Telephone Encounter (Signed)
ERx 

## 2021-12-21 NOTE — Telephone Encounter (Signed)
Refill request Clonazepam Last refill 11/23/21 #30 Last office visit 12/18/21

## 2021-12-22 DIAGNOSIS — G4733 Obstructive sleep apnea (adult) (pediatric): Secondary | ICD-10-CM | POA: Diagnosis not present

## 2021-12-23 DIAGNOSIS — G4733 Obstructive sleep apnea (adult) (pediatric): Secondary | ICD-10-CM | POA: Diagnosis not present

## 2021-12-23 NOTE — Telephone Encounter (Signed)
Order sent to [facility] to change settings for BIPAP.  Patient scheduled to see Brooke Parrett NP on 02/22/22 at 4:30 pm.  Nothing further needed.

## 2021-12-24 NOTE — Telephone Encounter (Signed)
Brooke Beasley, please advise. Thanks   Brooke Beasley  P [facility] (supporting Melvenia Needles, NP) 12 minutes ago (11:35 AM)    I read the results and it looks like the pressure needs to be changed. Will you be placing ann order change to [facility] so that I can take the machine to them and get it corrected? I would also like them to check the mask. It's not fitting snug on my face anymore. My next appointment with you is Oct  31 and I don't want to wait that long to have pressure changes. Thank you so much.

## 2021-12-24 NOTE — Telephone Encounter (Signed)
Im confused there was a phone message on 12/17/21 with BIPAP orders , did these not get done ?

## 2021-12-25 ENCOUNTER — Ambulatory Visit: Payer: PPO | Attending: Cardiology | Admitting: Cardiology

## 2021-12-25 ENCOUNTER — Encounter: Payer: Self-pay | Admitting: Cardiology

## 2021-12-25 VITALS — BP 138/70 | HR 67 | Ht 61.0 in | Wt 150.1 lb

## 2021-12-25 DIAGNOSIS — I4892 Unspecified atrial flutter: Secondary | ICD-10-CM | POA: Diagnosis not present

## 2021-12-25 DIAGNOSIS — I350 Nonrheumatic aortic (valve) stenosis: Secondary | ICD-10-CM | POA: Diagnosis not present

## 2021-12-25 DIAGNOSIS — I10 Essential (primary) hypertension: Secondary | ICD-10-CM | POA: Diagnosis not present

## 2021-12-25 NOTE — Patient Instructions (Signed)
Medication Instructions:   Your physician recommends that you continue on your current medications as directed. Please refer to the Current Medication list given to you today.  *If you need a refill on your cardiac medications before your next appointment, please call your pharmacy*    Follow-Up: At [facility], you and your health needs are our priority.  As part of our continuing mission to provide you with exceptional heart care, we have created designated Provider Care Teams.  These Care Teams include your primary Cardiologist (physician) and Advanced Practice Providers (APPs -  Physician Assistants and Nurse Practitioners) who all work together to provide you with the care you need, when you need it.  We recommend signing up for the patient portal called "MyChart".  Sign up information is provided on this After Visit Summary.  MyChart is used to connect with patients for Virtual Visits (Telemedicine).  Patients are able to view lab/test results, encounter notes, upcoming appointments, etc.  Non-urgent messages can be sent to your provider as well.   To learn more about what you can do with MyChart, go to [email or web address].    Your next appointment:   6 month(s)  The format for your next appointment:   In Person  Provider:   ONLY WITH Kate Sable, MD    Other Instructions   Important Information About Sugar

## 2021-12-25 NOTE — Progress Notes (Signed)
Cardiology Office Note:    Date:  12/25/2021   ID:  Brooke Beasley, DOB [birth year], MRN [id number]  PCP:  [name], MD  [facility] Cardiologist:  Kate Sable, MD  [place] Electrophysiologist:  None   Referring MD: [name], MD   Chief Complaint  Patient presents with   Other    Post cardioversion c/o weakness. Meds reviewed verbally with pt.    History of Present Illness:    Brooke Beasley is a 88 y.o. female with a hx of anxiety, persistent atrial flutter/A-fib s/p DC cardioversion 0/9381, grade 2 diastolic dysfunction, hypertension, hyperlipidemia, CKD 3, mild aortic stenosis who presents for follow-up.    Previously seen for atrial flutter, underwent cardioversion 10/2021 successfully.  Takes Eliquis for stroke prophylaxis, denies any bleeding issues.  Denies palpitations or dizziness.BP is adequately controlled.  Prior notes Echo TTE 07/2021 EF 55 to 60%, moderately dilated LA, moderate MR, posterior mitral leaflet prolapse Echocardiogram on 11/26/2019 showed normal systolic function, EF 60 to 82%, grade 2 diastolic dysfunction.  Mild to moderate MR, mild AS. Renal artery stenosis about 1 to 59% on the right, left renal artery unsuccessful to assess  Patient previously took lisinopril, but stopped due to side effects/cough.  Amlodipine stopped due to abdominal pains, Coreg was stopped because of fatigue.  Past Medical History:  Diagnosis Date   Anxiety    C. difficile diarrhea 07/11/2018   S/p multiple oral vanc treatments (course and tapers) and completed Zinplava monoclonal Ab treatment (05/10/2019) Fecal transplant on hold during COVID pandemic   CAP (community acquired pneumonia) 12/22/2017   Carotid stenosis 10/18/2014   R 1-39%, L 40-59%, rpt 1 yr (09/2014)    CKD (chronic kidney disease) stage 3, GFR 30-59 ml/min (HCC) 08/02/2014   Closed fracture of right orbit ([place]) 02/10/2021   Clostridioides difficile infection    hx  2020   COVID-19 virus infection 10/13/2020   Degenerative disc disease    LS   Depression    nervous breakdonw in 1964-out of work for a year   Diverticulosis    severe by colonoscopy   Family history of adverse reaction to anesthesia    n/v   GERD (gastroesophageal reflux disease)    Glaucoma    Heart murmur 08/2011   mitral regurge - on echo    History of hiatal hernia    History of shingles    HTN (hypertension)    Hyperlipidemia    Hypertension    Hypothyroidism    IBS 11/02/2006   Intestinal bacterial overgrowth    In small colon   Left shoulder pain 11/04/2014   Lower GI bleed 12/2020   thought diverticular complicated by ABLA with syncope and orbital fracture s/p hospitalization   Maxillary fracture ([place]) 04/08/2012   Osteoarthritis 2016   Jefm Bryant)   Osteoporosis    dexa 2011   Perirectal abscess 08/03/2021   CT guided aspiration of abscess grow Proteums mirabilis and Citrobacter koseri 07/2021, currently receiving IV Zosyn planned total 6 wks.   Pseudogout 2016   shoulders ([place])    Past Surgical History:  Procedure Laterality Date   barium enema  2012   severe diverticulosis, redundant colon   Bowel obstruction  1999   no surgery in hosp x 3 days   BUBBLE STUDY  07/27/2021   Procedure: BUBBLE STUDY;  Surgeon: Dixie Dials, MD;  Location: [place];  Service: Cardiovascular;;   CARDIOVERSION N/A 11/19/2021   Procedure: CARDIOVERSION;  Surgeon: Kate Sable, MD;  Location: [facility];  Service: Cardiovascular;  Laterality: N/A;   COLONOSCOPY  1. 1999  2. 11/04   1. Not finished  2. Slight hemorrhage rectosigmoid area, severe sig diverticulosis   COLONOSCOPY WITH PROPOFOL N/A 09/10/2019   SSP with dysplasia, TA, rpt 3 yrs Marius Ditch, Tally Due, MD)   Dexa  1. [phone]   2. 9/04  3. 3/08    1. OP  2. OP, borderline, spine -2.44T  3. decreased BMD-OP   DG KNEE 1-2 VIEWS BILAT     LS x-ray with degenerative disc and facet change    ESOPHAGOGASTRODUODENOSCOPY     Negative   FLEXIBLE SIGMOIDOSCOPY N/A 01/16/2021   Procedure: FLEXIBLE SIGMOIDOSCOPY;  Surgeon: Gatha Mayer, MD;  Location: [place];  Service: Endoscopy;  Laterality: N/A;  or unsedated   Hemorrhoid procedure  07/2006   PICC LINE REMOVAL ([place] HX)  08/28/2021   PROCTOSCOPY N/A 05/26/2021   Procedure: RIGID PROCTOSCOPY;  Surgeon: Michael Boston, MD;  Location: [facility];  Service: General;  Laterality: N/A;   RECTOPEXY N/A 05/26/2021   Procedure: RECTOPEXY;  Surgeon: Michael Boston, MD;  Location: [facility];  Service: General;  Laterality: N/A;   TEE WITHOUT CARDIOVERSION N/A 07/27/2021   Procedure: TRANSESOPHAGEAL ECHOCARDIOGRAM (TEE);  Surgeon: Dixie Dials, MD;  Location: [facility];  Service: Cardiovascular;  Laterality: N/A;   TONSILLECTOMY     TOTAL SHOULDER ARTHROPLASTY Left 11/27/2015   Corky Mull, MD   TOTAL SHOULDER REVISION Left 03/16/2016   Procedure: TOTAL SHOULDER REVISION;  Surgeon: Corky Mull, MD;  Location: [facility];  Service: Orthopedics;  Laterality: Left;   US ECHOCARDIOGRAPHY  [id number]/8242   Normal systolic fxn with EF 35-36%.  Focal basal septal hypertrophy.  Mild diastolic dysfunction.  Mild MR.   XI ROBOTIC ASSISTED LOWER ANTERIOR RESECTION N/A 05/26/2021   Procedure: ROBOTIC LOW ANTERIOR RECTOSIGMOID RESECTION; ASSESSMENT OF TISSUE PERFUSION WITH FIREFLY;  Surgeon: Michael Boston, MD;  Location: [facility];  Service: General;  Laterality: N/A;    Current Medications: Current Meds  Medication Sig   acetaminophen (TYLENOL) 500 MG tablet Take 500-1,000 mg by mouth every 6 (six) hours as needed for moderate pain. Depends on pain   albuterol (PROVENTIL HFA;VENTOLIN HFA) 108 (90 Base) MCG/ACT inhaler Inhale 2 puffs into the lungs every 6 (six) hours as needed for wheezing or shortness of breath.   ALPHAGAN P 0.1 % SOLN Place 1 drop into both eyes daily.   apixaban (ELIQUIS) 5 MG TABS tablet Take 1 tablet (5 mg total) by mouth 2 (two) times  daily.   bimatoprost (LUMIGAN) 0.01 % SOLN Place 1 drop into both eyes at bedtime.   bisacodyl (DULCOLAX) 5 MG EC tablet Take 1 tablet (5 mg total) by mouth daily as needed for moderate constipation.   carvedilol (COREG) 6.25 MG tablet Take 1 tablet (6.25 mg total) by mouth 2 (two) times daily with a meal.   cholecalciferol (VITAMIN D) 1000 units tablet Take 1,000 Units by mouth daily.   clobetasol cream (TEMOVATE) 1.44 % Apply 1 Application topically 2 (two) times daily.   clonazePAM (KLONOPIN) 0.5 MG tablet TAKE 1 TABLET BY MOUTH AT BEDTIME   colchicine 0.6 MG tablet Take 1 tablet (0.6 mg total) by mouth daily.   diclofenac Sodium (VOLTAREN) 1 % GEL Apply 1 application  topically 3 (three) times daily as needed.   Famotidine-Ca Carb-Mag Hydrox (PEPCID COMPLETE PO) Take 1 tablet by mouth daily as needed (indigestion).   Ferrous Sulfate (IRON PO) Take  by mouth daily.   fexofenadine (ALLEGRA) 180 MG tablet Take 180 mg by mouth daily.   furosemide (LASIX) 20 MG tablet Take 1 tablet (20 mg total) by mouth daily.   gabapentin (NEURONTIN) 300 MG capsule Take 1 capsule (300 mg total) by mouth daily as needed (nerve pain, itching).   hydrALAZINE (APRESOLINE) 25 MG tablet Take 1 tablet (25 mg total) by mouth every 8 (eight) hours.   hydrocortisone 2.5 % cream Place 1 application rectally daily as needed (hemorrhoids).   Infant Care Products [facility]) OINT Apply 1 application topically 2 (two) times daily as needed.   levothyroxine (SYNTHROID) 112 MCG tablet Take 1 tablet (112 mcg total) by mouth daily before breakfast.   loperamide (IMODIUM) 2 MG capsule Take 1 capsule (2 mg total) by mouth 3 (three) times daily as needed for diarrhea or loose stools.   Misc Natural Products (OSTEO BI-FLEX TRIPLE STRENGTH PO) Take 1 tablet by mouth daily.   Multiple Vitamin (MULTIVITAMIN ADULT PO) Take by mouth daily.   polyethylene glycol (MIRALAX / GLYCOLAX) 17 g packet Take 17 g by mouth daily.   Potassium 99 MG  TABS Take 1 tablet (99 mg total) by mouth every Monday, Wednesday, and Friday.   Simethicone 180 MG CAPS Take 1 capsule by mouth daily as needed (indigestion).   spironolactone (ALDACTONE) 25 MG tablet Take 1 tablet by mouth daily   vitamin B-12 (CYANOCOBALAMIN) 100 MCG tablet Take 100 mcg by mouth daily.     Allergies:   Amlodipine, Ace inhibitors, Carvedilol, Chlorthalidone, Losartan, Other, Paroxetine hcl, Risedronate sodium, Simvastatin, Toprol xl [metoprolol], Alendronate sodium, Influenza vaccines, Silenor [doxepin hcl], and Sulfonamide derivatives   Social History   Socioeconomic History   Marital status: Widowed    Spouse name: Not on file   Number of children: 2   Years of education: Not on file   Highest education level: Not on file  Occupational History   Occupation: Retired    Fish farm manager: RETIRED  Tobacco Use   Smoking status: Never   Smokeless tobacco: Never  Vaping Use   Vaping Use: Never used  Substance and Sexual Activity   Alcohol use: No   Drug use: No   Sexual activity: Not Currently  Other Topics Concern   Not on file  Social History Narrative   Left handed   Widow. Husband ([name]) deceased 04-01-2016 from dementia. She was caregiver.    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   Activity: no regular exercise   Diet: good water, fruits/vegetables daily      Patient Care Team:   [name], MD as PCP - General (Family Medicine)   Kate Sable, MD as PCP - Cardiology (Cardiology)   Dannielle Karvonen, RN as [place] Management   Gross, Remo Lipps, MD as Consulting Physician (General Surgery)   Lin Landsman, MD as Consulting Physician (Gastroenterology)   Kate Sable, MD as Consulting Physician (Cardiology)   Social Determinants of Health   Financial Resource Strain: Low Risk  (03/30/2021)   Overall Financial Resource Strain (CARDIA)    Difficulty  of Paying Living Expenses: Not very hard  Food Insecurity: No Food Insecurity (03/30/2021)   Hunger Vital Sign    Worried About Running Out of Food in the Last Year: Never true    [place] in the Last Year: Never true  Transportation Needs: No Transportation Needs (03/30/2021)  PRAPARE - Hydrologist (Medical): No    Lack of Transportation (Non-Medical): No  Physical Activity: Insufficiently Active (03/30/2021)   Exercise Vital Sign    Days of Exercise per Week: 7 days    Minutes of Exercise per Session: 10 min  Stress: No Stress Concern Present (03/30/2021)   [place]    Feeling of Stress : Only a little  Social Connections: Socially Isolated (03/30/2021)   Social Connection and Isolation Panel [NHANES]    Frequency of Communication with Friends and Family: More than three times a week    Frequency of Social Gatherings with Friends and Family: Three times a week    Attends Religious Services: Never    Active Member of Clubs or Organizations: No    Attends Archivist Meetings: Never    Marital Status: Widowed     Family History: The patient's family history includes Breast cancer in her cousin; Coronary artery disease in her brother; Diabetes in her brother and paternal grandfather.  ROS:   Please see the history of present illness.     All other systems reviewed and are negative.  EKGs/Labs/Other Studies Reviewed:    The following studies were reviewed today:   EKG:  EKG is ordered today.  EKG shows normal sinus rhythm, heart rate 67  Recent Labs: 02/10/2021: Pro B Natriuretic peptide (BNP) 165.0 09/16/2021: Magnesium 1.8 12/18/2021: ALT 29; BUN 31; Creatinine, Ser 1.04; Hemoglobin 14.0; Platelets 260.0; Potassium 4.1; Sodium 139; TSH 0.08  Recent Lipid Panel    Component Value Date/Time   CHOL 179 03/27/2021 0935   TRIG 69 03/27/2021 0935   HDL 71 03/27/2021  0935   CHOLHDL 2.5 03/27/2021 0935   CHOLHDL 3.6 03/17/2020 1519   VLDL 27 03/17/2020 1519   LDLCALC 95 03/27/2021 0935   LDLDIRECT 86.0 09/19/2014 1049    Physical Exam:    VS:  BP 138/70 (BP Location: Left Arm, Patient Position: Sitting, Cuff Size: Normal)   Pulse 67   Ht '5\' 1"'$  (1.549 m)   Wt 150 lb 2 oz (68.1 kg)   SpO2 97%   BMI 28.37 kg/m     Wt Readings from Last 3 Encounters:  12/25/21 150 lb 2 oz (68.1 kg)  12/18/21 149 lb 4 oz (67.7 kg)  12/10/21 146 lb 2 oz (66.3 kg)     GEN:  Well nourished, well developed in no acute distress HEENT: Normal NECK: No JVD; No carotid bruits CARDIAC: Regular rate and rhythm, 3/6 systolic murmur RESPIRATORY:  Clear to auscultation without rales, wheezing or rhonchi  ABDOMEN: Soft, non-tender, non-distended MUSCULOSKELETAL: No edema, no deformity  SKIN: Warm and dry NEUROLOGIC:  Alert and oriented x 3 PSYCHIATRIC:  Normal affect   ASSESSMENT:    1. Atrial flutter, unspecified type ([place])   2. Primary hypertension   3. Aortic valve stenosis, etiology of cardiac valve disease unspecified    PLAN:    In order of problems listed above:  Atrial flutter, s/p DC cardioversion 10/2021.  Maintaining sinus rhythm, continue Coreg, Eliquis, EF 55%.   Hypertension, BP controlled.  Continue carvedilol, hydralazine, Aldactone 25 mg daily. Mild aortic stenosis, echo 06/2021 unchanged from echocardiogram 1 year ago.  EF 65 to 70%.  Continue to monitor serially  Follow-up in 6 weeks.    Medication Adjustments/Labs and Tests Ordered: Current medicines are reviewed at length with the patient today.  Concerns regarding medicines are outlined above.  No orders of the defined types were placed in this encounter.   No orders of the defined types were placed in this encounter.     Patient Instructions  Medication Instructions:   Your physician recommends that you continue on your current medications as directed. Please refer to the Current  Medication list given to you today.  *If you need a refill on your cardiac medications before your next appointment, please call your pharmacy*    Follow-Up: At [facility], you and your health needs are our priority.  As part of our continuing mission to provide you with exceptional heart care, we have created designated Provider Care Teams.  These Care Teams include your primary Cardiologist (physician) and Advanced Practice Providers (APPs -  Physician Assistants and Nurse Practitioners) who all work together to provide you with the care you need, when you need it.  We recommend signing up for the patient portal called "MyChart".  Sign up information is provided on this After Visit Summary.  MyChart is used to connect with patients for Virtual Visits (Telemedicine).  Patients are able to view lab/test results, encounter notes, upcoming appointments, etc.  Non-urgent messages can be sent to your provider as well.   To learn more about what you can do with MyChart, go to [email or web address].    Your next appointment:   6 month(s)  The format for your next appointment:   In Person  Provider:   ONLY WITH Kate Sable, MD    Other Instructions   Important Information About Sugar         Signed, Kate Sable, MD  12/25/2021 3:31 PM    [place]

## 2021-12-25 NOTE — Telephone Encounter (Signed)
TP FYI - Bipap pressure change order was placed on 8/30. [employer] confirmed they received the order today (more specifically an hour ago). They have not reached out to pt yet to change the pressure. Pt was informed to reach out to [place] if [employer] does not contact her next at the latest.

## 2021-12-29 ENCOUNTER — Ambulatory Visit: Payer: PPO | Admitting: Family Medicine

## 2021-12-31 NOTE — Addendum Note (Signed)
Addended by: Britt Bottom on: 12/31/2021 04:32 PM   Modules accepted: Orders

## 2022-01-01 ENCOUNTER — Encounter: Payer: Self-pay | Admitting: Family Medicine

## 2022-01-01 ENCOUNTER — Ambulatory Visit (INDEPENDENT_AMBULATORY_CARE_PROVIDER_SITE_OTHER): Payer: PPO | Admitting: Family Medicine

## 2022-01-01 DIAGNOSIS — E039 Hypothyroidism, unspecified: Secondary | ICD-10-CM | POA: Diagnosis not present

## 2022-01-01 DIAGNOSIS — I4892 Unspecified atrial flutter: Secondary | ICD-10-CM | POA: Diagnosis not present

## 2022-01-01 DIAGNOSIS — L299 Pruritus, unspecified: Secondary | ICD-10-CM

## 2022-01-01 DIAGNOSIS — I10 Essential (primary) hypertension: Secondary | ICD-10-CM | POA: Diagnosis not present

## 2022-01-01 LAB — ANTI-NUCLEAR AB-TITER (ANA TITER)
ANA TITER: 1:40 {titer} — ABNORMAL HIGH
ANA Titer 1: 1:40 {titer} — ABNORMAL HIGH

## 2022-01-01 LAB — CELIAC PNL 2 RFLX ENDOMYSIAL AB TTR
(tTG) Ab, IgA: <1 U/mL
(tTG) Ab, IgG: <1 U/mL
Endomysial Ab IgA: NEGATIVE
Gliadin IgA: <1 U/mL
Gliadin IgG: <1 U/mL
Immunoglobulin A: 190 mg/dL (ref 70–320)

## 2022-01-01 LAB — PROTEIN ELECTROPHORESIS, SERUM, WITH REFLEX
Albumin ELP: 3.7 g/dL — ABNORMAL LOW (ref 3.8–4.8)
Alpha 1: 0.3 g/dL (ref 0.2–0.3)
Alpha 2: 0.7 g/dL (ref 0.5–0.9)
Beta 2: 0.3 g/dL (ref 0.2–0.5)
Beta Globulin: 0.5 g/dL (ref 0.4–0.6)
Gamma Globulin: 1 g/dL (ref 0.8–1.7)
Total Protein: 6.5 g/dL (ref 6.1–8.1)

## 2022-01-01 LAB — ANA: Anti Nuclear Antibody (ANA): POSITIVE — AB

## 2022-01-01 MED ORDER — IRON 28 MG PO TABS: 1.0000 | ORAL_TABLET | ORAL | Status: DC

## 2022-01-01 MED ORDER — HYDRALAZINE HCL 25 MG PO TABS: 25.0000 mg | ORAL_TABLET | Freq: Two times a day (BID) | ORAL | 1 refills | Status: DC

## 2022-01-01 NOTE — Progress Notes (Unsigned)
Patient ID: Brooke Beasley, female    DOB: 1930-06-04, 88 y.o.   MRN: [id number]  This visit was conducted in person.  BP (!) 156/72 (BP Location: Right Arm, Cuff Size: Normal)   Pulse 71   Temp 98 F (36.7 C) (Temporal)   Ht '5\' 1"'$  (1.549 m)   Wt 150 lb (68 kg)   SpO2 94%   BMI 28.34 kg/m    CC: 3 mo f/u visit  Subjective:   HPI: Brooke Beasley is a 88 y.o. female presenting on 01/01/2022 for Follow-up (Here for 3 mo f/u. Pt accompanied by daughter, [name]. )   Saw Dr Garen Lah last week, note reviewed. Doing well s/p DCCV 10/2021. Continues eliquis, coreg. Also on hydralazine, aldactone.  Some low BP readings at home especially after breakfast (90s/50s) associated with fatigue and sedation. No dizziness or dyspnea. They stopped breakfast hydralazine, continue lunch and dinner.   Seeing pulm for OSA - sleep study showed mod OSA - rec BiPAP 11/8 ratio with nocturnal oxygen to 4L/min.   Chronic pruritis - ongoing, referred by ID to allergist, appt end of 01/2022.   Hypothyroidism - on Synthroid 136mg daily - TSH recently returned low so Synthroid dose dropped to 1168m daily. Lower dose started 12/22/2021.      Relevant past medical, surgical, family and social history reviewed and updated as indicated. Interim medical history since our last visit reviewed. Allergies and medications reviewed and updated. Outpatient Medications Prior to Visit  Medication Sig Dispense Refill   acetaminophen (TYLENOL) 500 MG tablet Take 500-1,000 mg by mouth every 6 (six) hours as needed for moderate pain. Depends on pain     albuterol (PROVENTIL HFA;VENTOLIN HFA) 108 (90 Base) MCG/ACT inhaler Inhale 2 puffs into the lungs every 6 (six) hours as needed for wheezing or shortness of breath. 1 Inhaler 0   ALPHAGAN P 0.1 % SOLN Place 1 drop into both eyes daily.     apixaban (ELIQUIS) 5 MG TABS tablet Take 1 tablet (5 mg total) by mouth 2 (two) times daily. 60 tablet 6   bimatoprost  (LUMIGAN) 0.01 % SOLN Place 1 drop into both eyes at bedtime.     bisacodyl (DULCOLAX) 5 MG EC tablet Take 1 tablet (5 mg total) by mouth daily as needed for moderate constipation. 30 tablet 1   carvedilol (COREG) 6.25 MG tablet Take 1 tablet (6.25 mg total) by mouth 2 (two) times daily with a meal. 180 tablet 1   cholecalciferol (VITAMIN D) 1000 units tablet Take 1,000 Units by mouth daily.     clobetasol cream (TEMOVATE) 0.7.90 Apply 1 Application topically 2 (two) times daily. 30 g 0   clonazePAM (KLONOPIN) 0.5 MG tablet TAKE 1 TABLET BY MOUTH AT BEDTIME 30 tablet 1   colchicine 0.6 MG tablet Take 1 tablet (0.6 mg total) by mouth daily. 90 tablet 1   diclofenac Sodium (VOLTAREN) 1 % GEL Apply 1 application  topically 3 (three) times daily as needed.     Famotidine-Ca Carb-Mag Hydrox (PEPCID COMPLETE PO) Take 1 tablet by mouth daily as needed (indigestion).     fexofenadine (ALLEGRA) 180 MG tablet Take 180 mg by mouth daily.     furosemide (LASIX) 20 MG tablet Take 1 tablet (20 mg total) by mouth daily. 30 tablet 6   gabapentin (NEURONTIN) 300 MG capsule Take 1 capsule (300 mg total) by mouth daily as needed (nerve pain, itching). 30 capsule 3   hydrocortisone 2.5 % cream Place  1 application rectally daily as needed (hemorrhoids).     Infant Care Products [facility]) OINT Apply 1 application topically 2 (two) times daily as needed. 430 g 0   levothyroxine (SYNTHROID) 112 MCG tablet Take 1 tablet (112 mcg total) by mouth daily before breakfast. 90 tablet 1   loperamide (IMODIUM) 2 MG capsule Take 1 capsule (2 mg total) by mouth 3 (three) times daily as needed for diarrhea or loose stools. 30 capsule 0   Misc Natural Products (OSTEO BI-FLEX TRIPLE STRENGTH PO) Take 1 tablet by mouth daily.     Multiple Vitamin (MULTIVITAMIN ADULT PO) Take by mouth daily.     polyethylene glycol (MIRALAX / GLYCOLAX) 17 g packet Take 17 g by mouth daily.     Potassium 99 MG TABS Take 1 tablet (99 mg total) by mouth  every Monday, Wednesday, and Friday. 330 tablet    Simethicone 180 MG CAPS Take 1 capsule by mouth daily as needed (indigestion).     spironolactone (ALDACTONE) 25 MG tablet Take 1 tablet by mouth daily 90 tablet 2   vitamin B-12 (CYANOCOBALAMIN) 100 MCG tablet Take 100 mcg by mouth daily.     Ferrous Sulfate (IRON PO) Take by mouth daily.     hydrALAZINE (APRESOLINE) 25 MG tablet Take 1 tablet (25 mg total) by mouth every 8 (eight) hours. 90 tablet 0   No facility-administered medications prior to visit.     Per HPI unless specifically indicated in ROS section below Review of Systems  Objective:  BP (!) 156/72 (BP Location: Right Arm, Cuff Size: Normal)   Pulse 71   Temp 98 F (36.7 C) (Temporal)   Ht '5\' 1"'$  (1.549 m)   Wt 150 lb (68 kg)   SpO2 94%   BMI 28.34 kg/m   Wt Readings from Last 3 Encounters:  01/01/22 150 lb (68 kg)  12/25/21 150 lb 2 oz (68.1 kg)  12/18/21 149 lb 4 oz (67.7 kg)      Physical Exam Vitals and nursing note reviewed.  Constitutional:      Appearance: Normal appearance. She is not ill-appearing.  Eyes:     Extraocular Movements: Extraocular movements intact.     Conjunctiva/sclera: Conjunctivae normal.     Pupils: Pupils are equal, round, and reactive to light.  Cardiovascular:     Rate and Rhythm: Normal rate and regular rhythm.     Pulses: Normal pulses.     Heart sounds: Normal heart sounds. No murmur heard. Pulmonary:     Effort: Pulmonary effort is normal. No respiratory distress.     Breath sounds: Normal breath sounds. No wheezing, rhonchi or rales.  Musculoskeletal:     Right lower leg: No edema.     Left lower leg: No edema.  Skin:    General: Skin is warm and dry.     Findings: No rash.  Neurological:     Mental Status: She is alert.  Psychiatric:        Mood and Affect: Mood normal.        Behavior: Behavior normal.       Results for orders placed or performed in visit on 12/18/21  ANA  Result Value Ref Range   Anti  Nuclear Antibody (ANA) POSITIVE (A) NEGATIVE  Sedimentation rate  Result Value Ref Range   Sed Rate 29 0 - 30 mm/hr  TSH  Result Value Ref Range   TSH 0.08 (L) 0.35 - 5.50 uIU/mL  CBC with Differential/Platelet  Result Value Ref  Range   WBC 7.3 4.0 - 10.5 K/uL   RBC 4.56 3.87 - 5.11 Mil/uL   Hemoglobin 14.0 12.0 - 15.0 g/dL   HCT 41.9 36.0 - 46.0 %   MCV 91.8 78.0 - 100.0 fl   MCHC 33.4 30.0 - 36.0 g/dL   RDW 17.1 (H) 11.5 - 15.5 %   Platelets 260.0 150.0 - 400.0 K/uL   Neutrophils Relative % 78.7 (H) 43.0 - 77.0 %   Lymphocytes Relative 14.1 12.0 - 46.0 %   Monocytes Relative 5.6 3.0 - 12.0 %   Eosinophils Relative 1.4 0.0 - 5.0 %   Basophils Relative 0.2 0.0 - 3.0 %   Neutro Abs 5.8 1.4 - 7.7 K/uL   Lymphs Abs 1.0 0.7 - 4.0 K/uL   Monocytes Absolute 0.4 0.1 - 1.0 K/uL   Eosinophils Absolute 0.1 0.0 - 0.7 K/uL   Basophils Absolute 0.0 0.0 - 0.1 K/uL  Comprehensive metabolic panel  Result Value Ref Range   Sodium 139 135 - 145 mEq/L   Potassium 4.1 3.5 - 5.1 mEq/L   Chloride 100 96 - 112 mEq/L   CO2 30 19 - 32 mEq/L   Glucose, Bld 142 (H) 70 - 99 mg/dL   BUN 31 (H) 6 - 23 mg/dL   Creatinine, Ser 1.04 0.40 - 1.20 mg/dL   Total Bilirubin 0.7 0.2 - 1.2 mg/dL   Alkaline Phosphatase 88 39 - 117 U/L   AST 27 0 - 37 U/L   ALT 29 0 - 35 U/L   Total Protein 6.6 6.0 - 8.3 g/dL   Albumin 4.0 3.5 - 5.2 g/dL   GFR 47.80 (L) >60.00 mL/min   Calcium 10.4 8.4 - 10.5 mg/dL  Serum protein electrophoresis with reflex  Result Value Ref Range   Total Protein 6.5 6.1 - 8.1 g/dL   Albumin ELP 3.7 (L) 3.8 - 4.8 g/dL   Alpha 1 0.3 0.2 - 0.3 g/dL   Alpha 2 0.7 0.5 - 0.9 g/dL   Beta Globulin 0.5 0.4 - 0.6 g/dL   Beta 2 0.3 0.2 - 0.5 g/dL   Gamma Globulin 1.0 0.8 - 1.7 g/dL   SPE Interp.    Ferritin  Result Value Ref Range   Ferritin 81.5 10.0 - 291.0 ng/mL  IBC panel  Result Value Ref Range   Iron 128 42 - 145 ug/dL   Transferrin 274.0 212.0 - 360.0 mg/dL   Saturation Ratios 33.4  20.0 - 50.0 %   TIBC 383.6 250.0 - 450.0 mcg/dL  C-reactive protein  Result Value Ref Range   CRP <1.0 0.5 - 20.0 mg/dL  Anti-nuclear ab-titer (ANA titer)  Result Value Ref Range   ANA Titer 1 1:40 (H) titer   ANA Pattern 1 Nuclear, Homogeneous (A)    ANA TITER 1:40 (H) titer   ANA PATTERN Cytoplasmic (A)     Assessment & Plan:   Problem List Items Addressed This Visit   None    Meds ordered this encounter  Medications   Ferrous Sulfate (IRON) 28 MG TABS    Sig: Take 1 tablet (28 mg total) by mouth every other day.   hydrALAZINE (APRESOLINE) 25 MG tablet    Sig: Take 1 tablet (25 mg total) by mouth in the morning and at bedtime.    Dispense:  180 tablet    Refill:  1   No orders of the defined types were placed in this encounter.    ***Follow up plan: Return in about 3 months (around  04/02/2022) for annual exam, prior fasting for blood work, medicare wellness visit.  [name], MD

## 2022-01-01 NOTE — Patient Instructions (Addendum)
Blood pressures are staying high - continue monitoring at home and send me readings.  Continue current regimen including hydralazine only twice daily.  Return in 3 months for physical/wellness visit.

## 2022-01-02 ENCOUNTER — Encounter: Payer: Self-pay | Admitting: Cardiology

## 2022-01-04 NOTE — Assessment & Plan Note (Signed)
S/p DCCV 10/2021  Continues eliquis and coreg.  Appreciate cardiology care

## 2022-01-04 NOTE — Assessment & Plan Note (Signed)
Chronic, recent TSH was low - so we recently dropped levothyroxine to 126mg daily. Will need recheck in 4-6 wks.  ?contribution to chronic pruritis.

## 2022-01-04 NOTE — Assessment & Plan Note (Signed)
BP elevated today. Multiple drug intolerances. Recent low am BPs led to stopping am hydralazine dose.  Continue monitoring at home and let care team know if staying consistently high.

## 2022-01-04 NOTE — Assessment & Plan Note (Addendum)
Chronic, longstanding. ?component of lichen simplex chronicus.  Upcoming allergist appt at end of October.  ?thyroid disease contribution - we recently dropped levothyroxine dose.  Continue current regimen including gabapentin, oral antihistamines (allegra + pepcid) and topical steroid.  Recent labs largely reassuring including SPEP, celiac testing, iron panel, ESR/CRP. ANA positive with low titers.

## 2022-01-05 ENCOUNTER — Other Ambulatory Visit: Payer: Self-pay

## 2022-01-11 ENCOUNTER — Telehealth: Payer: Self-pay | Admitting: Adult Health

## 2022-01-11 NOTE — Telephone Encounter (Signed)
Spoke to [place] with [employer].  He stated that according to patient's chart, [employer] will be going out to her home today to service machine.  He will also request bipap download.   Spoke to patient and relayed above message. She voiced her understanding.    Will continue to hold message to f/u on download.

## 2022-01-11 NOTE — Telephone Encounter (Signed)
Lm for Brad with [employer] to request download and ask about concentrator.

## 2022-01-11 NOTE — Telephone Encounter (Signed)
Tammy, please advise. Thanks   Patient is questioning if she should continue nocturnal oxygen?

## 2022-01-11 NOTE — Telephone Encounter (Signed)
Received voicemail from [place] with [employer]. Patient will need to take SD card to [employer] for download.  Spoke to patient and relayed above information. She stated that she would take SD card to [employer] tomorrow.

## 2022-01-14 NOTE — Telephone Encounter (Signed)
Tammy, please see attached download. Thank you.

## 2022-01-14 NOTE — Telephone Encounter (Signed)
I have called patient and went over in detail  Please contact office for sooner follow up if symptoms do not improve or worsen or seek emergency care

## 2022-01-14 NOTE — Telephone Encounter (Signed)
Spoke to patient. She stated that she has not went to [employer] but she plans to do that in the next few days.

## 2022-01-14 NOTE — Telephone Encounter (Signed)
Received download via [employer].  Message has been forwarded to [facility] for review.  Will close telephone encounter.

## 2022-01-14 NOTE — Telephone Encounter (Signed)
Can we please reach out to DME company and see what her pressure settings are at this only gives me the compliance report.

## 2022-01-18 DIAGNOSIS — I5032 Chronic diastolic (congestive) heart failure: Secondary | ICD-10-CM | POA: Diagnosis not present

## 2022-01-23 ENCOUNTER — Other Ambulatory Visit: Payer: Self-pay | Admitting: Family Medicine

## 2022-01-25 NOTE — Telephone Encounter (Addendum)
Gabapentin Last filled:  12/21/21, #30 Last OV:  01/01/22, 3 mo f/u Next OV:  04/20/22, CPE

## 2022-02-02 ENCOUNTER — Encounter: Payer: Self-pay | Admitting: Family Medicine

## 2022-02-02 ENCOUNTER — Other Ambulatory Visit: Payer: Self-pay | Admitting: Family Medicine

## 2022-02-02 DIAGNOSIS — E039 Hypothyroidism, unspecified: Secondary | ICD-10-CM

## 2022-02-02 DIAGNOSIS — L299 Pruritus, unspecified: Secondary | ICD-10-CM

## 2022-02-05 ENCOUNTER — Other Ambulatory Visit (INDEPENDENT_AMBULATORY_CARE_PROVIDER_SITE_OTHER): Payer: PPO

## 2022-02-05 DIAGNOSIS — E039 Hypothyroidism, unspecified: Secondary | ICD-10-CM | POA: Diagnosis not present

## 2022-02-05 DIAGNOSIS — L299 Pruritus, unspecified: Secondary | ICD-10-CM

## 2022-02-05 LAB — COMPREHENSIVE METABOLIC PANEL
ALT: 20 U/L (ref 0–35)
AST: 24 U/L (ref 0–37)
Albumin: 3.8 g/dL (ref 3.5–5.2)
Alkaline Phosphatase: 94 U/L (ref 39–117)
BUN: 24 mg/dL — ABNORMAL HIGH (ref 6–23)
CO2: 31 mEq/L (ref 19–32)
Calcium: 10 mg/dL (ref 8.4–10.5)
Chloride: 103 mEq/L (ref 96–112)
Creatinine, Ser: 1.14 mg/dL (ref 0.40–1.20)
GFR: 42.77 mL/min — ABNORMAL LOW (ref >60.00)
Glucose, Bld: 91 mg/dL (ref 70–99)
Potassium: 4.3 mEq/L (ref 3.5–5.1)
Sodium: 143 mEq/L (ref 135–145)
Total Bilirubin: 0.5 mg/dL (ref 0.2–1.2)
Total Protein: 6.5 g/dL (ref 6.0–8.3)

## 2022-02-05 LAB — T4, FREE: Free T4: 1.27 ng/dL (ref 0.60–1.60)

## 2022-02-05 LAB — TSH: TSH: 0.12 u[IU]/mL — ABNORMAL LOW (ref 0.35–5.50)

## 2022-02-08 ENCOUNTER — Other Ambulatory Visit: Payer: Self-pay | Admitting: Family Medicine

## 2022-02-08 MED ORDER — LEVOTHYROXINE SODIUM 100 MCG PO TABS: 100.0000 ug | ORAL_TABLET | Freq: Every day | ORAL | 1 refills | Status: DC

## 2022-02-13 ENCOUNTER — Other Ambulatory Visit: Payer: Self-pay | Admitting: Family Medicine

## 2022-02-15 NOTE — Telephone Encounter (Signed)
Gabapentin Last filled:  01/27/22, #30 Last OV:  01/01/22, 3 mo f/u Next OV: 04/20/22, CPE

## 2022-02-17 DIAGNOSIS — I5032 Chronic diastolic (congestive) heart failure: Secondary | ICD-10-CM | POA: Diagnosis not present

## 2022-02-19 ENCOUNTER — Telehealth: Payer: Self-pay

## 2022-02-19 NOTE — Telephone Encounter (Signed)
Spoke to [place] with [employer] and requested [employer].  He will request report and fax to our office.

## 2022-02-19 NOTE — Telephone Encounter (Signed)
Download received.

## 2022-02-20 ENCOUNTER — Other Ambulatory Visit: Payer: Self-pay | Admitting: Family Medicine

## 2022-02-22 ENCOUNTER — Encounter: Payer: Self-pay | Admitting: Adult Health

## 2022-02-22 ENCOUNTER — Ambulatory Visit: Payer: PPO | Admitting: Adult Health

## 2022-02-22 DIAGNOSIS — J9691 Respiratory failure, unspecified with hypoxia: Secondary | ICD-10-CM | POA: Diagnosis not present

## 2022-02-22 DIAGNOSIS — M11211 Other chondrocalcinosis, right shoulder: Secondary | ICD-10-CM | POA: Diagnosis not present

## 2022-02-22 DIAGNOSIS — G4733 Obstructive sleep apnea (adult) (pediatric): Secondary | ICD-10-CM

## 2022-02-22 DIAGNOSIS — M19011 Primary osteoarthritis, right shoulder: Secondary | ICD-10-CM | POA: Diagnosis not present

## 2022-02-22 DIAGNOSIS — G4739 Other sleep apnea: Secondary | ICD-10-CM | POA: Insufficient documentation

## 2022-02-22 NOTE — Progress Notes (Signed)
$'@Patient'L$  ID: Brooke Beasley, female    DOB: Dec 28, 1930, 88 y.o.   MRN: [id number]  Chief Complaint  Patient presents with   Follow-up    Referring provider: [name], MD  HPI: 88 year old female seen for sleep consult November 13, 2021 for nocturnal hypoxemia and witnessed apneic events Hospitalization May 2023 for a L5-S1 discitis/epidural abscess (Proteus/Citrobacter) requiring prolonged antibiotics. Medical history significant for C. difficile, atrial fibrillation, congestive heart failure, aortic valve stenosis, scoliosis  TEST/EVENTS :  3/31, follow-up MRI with contrast showed a 4 mm epidural phlegmon without drainable component. 3/31, underwent disc aspiration that grew Enterococcus.  Following CT scan showed decrease in the size of seroma. 4/2, started endocarditis coverage with Unasyn 4/3, TEE negative for endocarditis antibiotics adjusted by ID, PICC line placed by vascular access team 4/7, MRI lumbar and sacral spine were repeated because of persistent pain and elevated inflammatory markers.  8 showed continued discitis osteomyelitis and L5-S1 level.  It also showed 6 cm size collection posterior to the rectum likely an abscess. 4/10: CT-guided aspiration of pelvic fluid collection posterior to the rectum.   3/30-4/13>> hospitalization for L5-S1 discitis/abscess posterior to rectum-cultures Proteus/Citrobacter-discharged on IV Zosyn-stop date 5/22.  New onset A-fib-started on anticoagulation. 5/03>> outpatient MRI-resolution of L5-S1 epidural abscess, persistent L5-S1 discitis/osteomyelitis. 5/11>> due to persistent leukocytosis-intermittent fevers-PICC line removed on 5/5-transition to Augmentin by ID in the outpatient setting. 5/14>> admit for hyponatremia. 5/15>> no response to IVF-renal consult-to ICU for 3% NaCl.   5/17 >> transfer back to [facility]-off 3% NaCl since 5/16.   02/22/2022 Follow up: OSA  Patient returns for a 28-monthfollow-up.  Patient was seen  last visit for a sleep consult she was hospitalized earlier this year was found to have severe nocturnal hypoxemia.  She was suspected to have underlying untreated sleep apnea was discharged on BiPAP.  Patient was set up for a split night sleep study that was completed on December 22, 2021 . AHI 7.9/hr with SpO2 low at 89%.  Titration study showed optimal control on CPAP 11 cm H2O.  Patient already has a Luna BiPAP at home and has paid for it.  We continued her BiPAP , AutoSet with IPAP max at 11 cm H2O and EPAP minimum at 8 cm H2O.  Patient says she is doing well.  She wears her machine every single night.  Wears it if she takes a nap during the daytime.  She feels like it is helping her sleep better.  BiPAP download shows excellent compliance with 100% usage.  Daily average usage at 10 hours.  AHI 5.4/hour. she uses O2 2l/m with BIPAP at night .     Allergies  Allergen Reactions   Amlodipine Other (See Comments)    Stomach pains--"feel like my insides are on fire"   Ace Inhibitors Cough   Carvedilol Other (See Comments)    fatigue   Chlorthalidone Other (See Comments)    Severe hyponatremia   Losartan Other (See Comments)    Cr bumped   Other Other (See Comments)    No seeds or corn because of IBS   Paroxetine Hcl Other (See Comments)    Not effective - felt ill on this medicine   Risedronate Sodium     REACTION: joint pain   Simvastatin     REACTION: the full 20 mg pill causes leg pain- can tol 10 mg   Toprol Xl [Metoprolol] Diarrhea    Diarrhea and weakness   Alendronate Sodium Palpitations   Influenza  Vaccines Rash and Other (See Comments)    Led to mental breakdown in 1960s    Silenor [Doxepin Hcl] Palpitations    Hallucinations and racing heart   Sulfonamide Derivatives Rash    Immunization History  Administered Date(s) Administered   PFIZER(Purple Top)SARS-COV-2 Vaccination 08/10/2019, 08/31/2019   Pneumococcal Conjugate-13 06/08/2013   Pneumococcal Polysaccharide-23  04/26/2002   Td 04/26/1998, 10/22/2008   Zoster, Live 05/11/2010    Past Medical History:  Diagnosis Date   Anxiety    C. difficile diarrhea 07/11/2018   S/p multiple oral vanc treatments (course and tapers) and completed Zinplava monoclonal Ab treatment (05/10/2019) Fecal transplant on hold during COVID pandemic   CAP (community acquired pneumonia) 12/22/2017   Carotid stenosis 10/18/2014   R 1-39%, L 40-59%, rpt 1 yr (09/2014)    CKD (chronic kidney disease) stage 3, GFR 30-59 ml/min (HCC) 08/02/2014   Closed fracture of right orbit ([place]) 02/10/2021   Clostridioides difficile infection    hx 2020   COVID-19 virus infection 10/13/2020   Degenerative disc disease    LS   Depression    nervous breakdonw in 1964-out of work for a year   Diverticulosis    severe by colonoscopy   Family history of adverse reaction to anesthesia    n/v   GERD (gastroesophageal reflux disease)    Glaucoma    Heart murmur 08/2011   mitral regurge - on echo    History of hiatal hernia    History of shingles    HTN (hypertension)    Hyperlipidemia    Hypertension    Hypothyroidism    IBS 11/02/2006   Intestinal bacterial overgrowth    In small colon   Left shoulder pain 11/04/2014   Lower GI bleed 12/2020   thought diverticular complicated by ABLA with syncope and orbital fracture s/p hospitalization   Maxillary fracture ([place]) 04/08/2012   Osteoarthritis 2016   ([facility])   Osteoporosis    dexa 2011   Perirectal abscess 08/03/2021   CT guided aspiration of abscess grow Proteums mirabilis and Citrobacter koseri 07/2021, currently receiving IV Zosyn planned total 6 wks.   Pseudogout 2016   shoulders ([place])    Tobacco History: Social History   Tobacco Use  Smoking Status Never  Smokeless Tobacco Never   Counseling given: Not Answered   Outpatient Medications Prior to Visit  Medication Sig Dispense Refill   acetaminophen (TYLENOL) 500 MG tablet Take 500-1,000 mg by mouth every 6 (six)  hours as needed for moderate pain. Depends on pain     albuterol (PROVENTIL HFA;VENTOLIN HFA) 108 (90 Base) MCG/ACT inhaler Inhale 2 puffs into the lungs every 6 (six) hours as needed for wheezing or shortness of breath. 1 Inhaler 0   ALPHAGAN P 0.1 % SOLN Place 1 drop into both eyes daily.     apixaban (ELIQUIS) 5 MG TABS tablet Take 1 tablet (5 mg total) by mouth 2 (two) times daily. 60 tablet 6   bimatoprost (LUMIGAN) 0.01 % SOLN Place 1 drop into both eyes at bedtime.     carvedilol (COREG) 6.25 MG tablet Take 1 tablet (6.25 mg total) by mouth 2 (two) times daily with a meal. 180 tablet 1   cholecalciferol (VITAMIN D) 1000 units tablet Take 1,000 Units by mouth daily.     clobetasol cream (TEMOVATE) 9.73 % Apply 1 Application topically 2 (two) times daily. 30 g 0   clonazePAM (KLONOPIN) 0.5 MG tablet TAKE 1 TABLET BY MOUTH AT BEDTIME 30 tablet 1  colchicine 0.6 MG tablet Take 1 tablet (0.6 mg total) by mouth daily. 90 tablet 1   diclofenac Sodium (VOLTAREN) 1 % GEL Apply 1 application  topically 3 (three) times daily as needed.     Famotidine-Ca Carb-Mag Hydrox (PEPCID COMPLETE PO) Take 1 tablet by mouth daily as needed (indigestion).     Ferrous Sulfate (IRON) 28 MG TABS Take 1 tablet (28 mg total) by mouth every other day.     fexofenadine (ALLEGRA) 180 MG tablet Take 180 mg by mouth daily.     furosemide (LASIX) 20 MG tablet Take 1 tablet (20 mg total) by mouth daily. 30 tablet 6   gabapentin (NEURONTIN) 300 MG capsule TAKE 1 CAPSULE BY MOUTH ONCE DAILY AS NEEDED FOR  NERVE  PAIN  (ITCHING) 30 capsule 0   hydrALAZINE (APRESOLINE) 25 MG tablet Take 1 tablet (25 mg total) by mouth in the morning and at bedtime. 180 tablet 1   hydrocortisone 2.5 % cream Place 1 application rectally daily as needed (hemorrhoids).     Infant Care Products [facility]) OINT Apply 1 application topically 2 (two) times daily as needed. 430 g 0   levothyroxine (SYNTHROID) 100 MCG tablet Take 1 tablet (100 mcg  total) by mouth daily before breakfast. 90 tablet 1   loperamide (IMODIUM) 2 MG capsule Take 1 capsule (2 mg total) by mouth 3 (three) times daily as needed for diarrhea or loose stools. 30 capsule 0   Misc Natural Products (OSTEO BI-FLEX TRIPLE STRENGTH PO) Take 1 tablet by mouth daily.     Multiple Vitamin (MULTIVITAMIN ADULT PO) Take by mouth daily.     polyethylene glycol (MIRALAX / GLYCOLAX) 17 g packet Take 17 g by mouth daily.     Potassium 99 MG TABS Take 1 tablet (99 mg total) by mouth every Monday, Wednesday, and Friday. 330 tablet    Simethicone 180 MG CAPS Take 1 capsule by mouth daily as needed (indigestion).     spironolactone (ALDACTONE) 25 MG tablet Take 1 tablet by mouth daily 90 tablet 2   vitamin B-12 (CYANOCOBALAMIN) 100 MCG tablet Take 100 mcg by mouth daily.     No facility-administered medications prior to visit.     Review of Systems:   Constitutional:   No  weight loss, night sweats,  Fevers, chills,  +fatigue, or  lassitude.  HEENT:   No headaches,  Difficulty swallowing,  Tooth/dental problems, or  Sore throat,                No sneezing, itching, ear ache, nasal congestion, post nasal drip,   CV:  No chest pain,  Orthopnea, PND, swelling in lower extremities, anasarca, dizziness, palpitations, syncope.   GI  No heartburn, indigestion, abdominal pain, nausea, vomiting, diarrhea, change in bowel habits, loss of appetite, bloody stools.   Resp: No shortness of breath with exertion or at rest.  No excess mucus, no productive cough,  No non-productive cough,  No coughing up of blood.  No change in color of mucus.  No wheezing.  No chest wall deformity  Skin: no rash or lesions.  GU: no dysuria, change in color of urine, no urgency or frequency.  No flank pain, no hematuria   MS:  No joint pain or swelling.  No decreased range of motion.  No back pain.    Physical Exam  BP 110/68 (BP Location: Left Arm, Cuff Size: Normal)   Pulse (!) 58   Temp (!) 97.3 F  (36.3 C)  Ht '5\' 1"'$  (1.549 m)   Wt 155 lb 6.4 oz (70.5 kg)   SpO2 95%   BMI 29.36 kg/m   GEN: A/Ox3; pleasant , NAD, well nourished    HEENT:  [place]/AT,  NOSE-clear, THROAT-clear, no lesions, no postnasal drip or exudate noted.   NECK:  Supple w/ fair ROM; no JVD; normal carotid impulses w/o bruits; no thyromegaly or nodules palpated; no lymphadenopathy.    RESP  Clear  P & A; w/o, wheezes/ rales/ or rhonchi. no accessory muscle use, no dullness to percussion  CARD:  RRR, no m/r/g, no peripheral edema, pulses intact, no cyanosis or clubbing.  GI:   Soft & nt; nml bowel sounds; no organomegaly or masses detected.   Musco: Warm bil, no deformities or joint swelling noted.   Neuro: alert, no focal deficits noted.    Skin: Warm, no lesions or rashes    Lab Results:    BNP No results found for: "BNP"  ProBNP  Imaging: No results found.       Latest Ref Rng & Units 09/14/2021    2:00 PM  PFT Results  FVC-Pre L 1.87   FVC-Predicted Pre % 98   Pre FEV1/FVC % % 72   FEV1-Pre L 1.34   FEV1-Predicted Pre % 97     No results found for: "NITRICOXIDE"      Assessment & Plan:   OSA (obstructive sleep apnea) Excellent control and compliance on BIPAP   Plan  Patient Instructions  Continue on BIPAP At bedtime with oxygen  Continue on Oxygen 2l/m At bedtime with BIPAP  Follow up in 6 months and As needed   Please contact office for sooner follow up if symptoms do not improve or worsen or seek emergency care      Respiratory failure with hypoxia ([place]) Continue on oxygen 2l/m At bedtime  With BIPAP      Rexene Edison, NP 02/22/2022

## 2022-02-22 NOTE — Assessment & Plan Note (Signed)
Excellent control and compliance on BIPAP   Plan  Patient Instructions  Continue on BIPAP At bedtime with oxygen  Continue on Oxygen 2l/m At bedtime with BIPAP  Follow up in 6 months and As needed   Please contact office for sooner follow up if symptoms do not improve or worsen or seek emergency care

## 2022-02-22 NOTE — Patient Instructions (Addendum)
Continue on BIPAP At bedtime with oxygen  Continue on Oxygen 2l/m At bedtime with BIPAP  Follow up in 6 months and As needed   Please contact office for sooner follow up if symptoms do not improve or worsen or seek emergency care

## 2022-02-22 NOTE — Telephone Encounter (Signed)
Name of Medication: Clonazepam Name of Pharmacy: [facility] Date and Quantity: 01/25/22, #30 Last Office Visit and Type: 01/01/22, 3 mo f/u Next Office Visit and Type: 04/20/22, CPE Last Controlled Substance Agreement Date: 09/26/14 Last UDS: 09/26/14

## 2022-02-22 NOTE — Assessment & Plan Note (Signed)
Continue on oxygen 2l/m At bedtime  With BIPAP

## 2022-02-23 ENCOUNTER — Encounter: Payer: Self-pay | Admitting: Allergy and Immunology

## 2022-02-23 ENCOUNTER — Ambulatory Visit: Payer: PPO | Admitting: Allergy and Immunology

## 2022-02-23 VITALS — BP 154/76 | HR 60 | Temp 97.8°F | Resp 16 | Ht 60.0 in | Wt 156.0 lb

## 2022-02-23 DIAGNOSIS — L299 Pruritus, unspecified: Secondary | ICD-10-CM | POA: Diagnosis not present

## 2022-02-23 DIAGNOSIS — L500 Allergic urticaria: Secondary | ICD-10-CM | POA: Diagnosis not present

## 2022-02-23 MED ORDER — MONTELUKAST SODIUM 10 MG PO TABS: 10.0000 mg | ORAL_TABLET | Freq: Every day | ORAL | 5 refills | Status: DC

## 2022-02-23 MED ORDER — FAMOTIDINE 20 MG PO TABS: 20.0000 mg | ORAL_TABLET | Freq: Two times a day (BID) | ORAL | 5 refills | Status: DC

## 2022-02-23 MED ORDER — CETIRIZINE HCL 10 MG PO TABS: 10.0000 mg | ORAL_TABLET | Freq: Every day | ORAL | 5 refills | Status: DC

## 2022-02-23 NOTE — Telephone Encounter (Signed)
ERx 

## 2022-02-23 NOTE — Patient Instructions (Signed)
  1.  Allergen avoidance measures???  2.  Start omalizumab injection today and every 4 weeks  3.  Use a combination of the following every day:   A.  Cetirizine 10 mg - 1 tablet twice a day  B.  Famotidine 20 mg - 1 tablet twice a day  C.  Montelukast 10 mg - 1 tablet once a day  4.  Return to clinic in 4 weeks or earlier if problem  5.  Consider obtaining RSV vaccine

## 2022-02-23 NOTE — Progress Notes (Signed)
Reviewed and agree with assessment/plan.   Chesley Mires, MD [facility] 02/23/2022, 10:36 AM Pager:  [phone]

## 2022-02-23 NOTE — Progress Notes (Unsigned)
[place] - [place] - [place]   Dear Danise Mina,  Thank you for referring Brooke Beasley to the [facility] on 02/23/2022.   Below is a summation of this patient's evaluation and recommendations.  Thank you for your referral. I will keep you informed about this patient's response to treatment.   If you have any questions please do not hesitate to contact me.   Sincerely,  Jiles Prows, MD Allergy / Immunology [facility]   ______________________________________________________________________    NEW PATIENT NOTE  Referring Provider: [name], MD Primary Provider: [name], MD Date of office visit: 02/23/2022    Subjective:   Chief Complaint:  Brooke Beasley (DOB: 03-Jun-1930) is a 88 y.o. female who presents to the clinic on 02/23/2022 with a chief complaint of Pruritus (X 1 year ) .     HPI: Brooke Beasley presents to this clinic in evaluation of itchiness.  She has been itching for about 1 year at this point in time.  There is documentation that she has some itchiness in December 2022 in her medical record.  Her itchiness appears to involve all areas of her body and occurs all points of the day and night and keeps her awake.  She sometimes does notice redness to her skin even in areas that she does not itch.  She has been given a topical cream which has been of no help.  She has been given Pepcid and Allegra which has been of no help.  She has been given prednisone at least on 2 occasions and this actually does help but does not completely resolve this issue.  There is no obvious provoking factor giving rise to her itchiness.  However, it is interesting to note that she apparently had a cyst "infection wrapping around her spine" that became symptomatic in January 2023 and was addressed in March 2023 with prolonged antibiotic  administration.  She has not really had any significant environmental changes or started any new hobbies with woodworking or ceramic airborne particles then she has not started any new supplements or health foods or energy boosters or herbs and she has not started any new medications and at this point time does not have any symptoms of an ongoing infectious disease.  She does not receive the flu vaccine.  Past Medical History:  Diagnosis Date   Anxiety    C. difficile diarrhea 07/11/2018   S/p multiple oral vanc treatments (course and tapers) and completed Zinplava monoclonal Ab treatment (05/10/2019) Fecal transplant on hold during COVID pandemic   CAP (community acquired pneumonia) 12/22/2017   Carotid stenosis 10/18/2014   R 1-39%, L 40-59%, rpt 1 yr (09/2014)    CKD (chronic kidney disease) stage 3, GFR 30-59 ml/min (HCC) 08/02/2014   Closed fracture of right orbit ([place]) 02/10/2021   Clostridioides difficile infection    hx 2020   COVID-19 virus infection 10/13/2020   Degenerative disc disease    LS   Depression    nervous breakdonw in 1964-out of work for a year   Diverticulosis    severe by colonoscopy   Family history of adverse reaction to anesthesia    n/v   GERD (gastroesophageal reflux disease)    Glaucoma    Heart murmur 08/2011   mitral regurge - on echo    History of hiatal hernia    History of shingles    HTN (hypertension)  Hyperlipidemia    Hypertension    Hypothyroidism    IBS 11/02/2006   Intestinal bacterial overgrowth    In small colon   Left shoulder pain 11/04/2014   Lower GI bleed 12/2020   thought diverticular complicated by ABLA with syncope and orbital fracture s/p hospitalization   Maxillary fracture ([place]) 04/08/2012   Osteoarthritis 2016   Jefm Bryant)   Osteoporosis    dexa 2011   Perirectal abscess 08/03/2021   CT guided aspiration of abscess grow Proteums mirabilis and Citrobacter koseri 07/2021, currently receiving IV Zosyn planned total 6  wks.   Pseudogout 2016   shoulders ([place])    Past Surgical History:  Procedure Laterality Date   barium enema  2012   severe diverticulosis, redundant colon   Bowel obstruction  1999   no surgery in hosp x 3 days   BUBBLE STUDY  07/27/2021   Procedure: BUBBLE STUDY;  Surgeon: Dixie Dials, MD;  Location: [place];  Service: Cardiovascular;;   CARDIOVERSION N/A 11/19/2021   Procedure: CARDIOVERSION;  Surgeon: Kate Sable, MD;  Location: [facility];  Service: Cardiovascular;  Laterality: N/A;   COLONOSCOPY  1. 1999  2. 11/04   1. Not finished  2. Slight hemorrhage rectosigmoid area, severe sig diverticulosis   COLONOSCOPY WITH PROPOFOL N/A 09/10/2019   SSP with dysplasia, TA, rpt 3 yrs Marius Ditch, Tally Due, MD)   Dexa  1. [phone]   2. 9/04  3. 3/08    1. OP  2. OP, borderline, spine -2.44T  3. decreased BMD-OP   DG KNEE 1-2 VIEWS BILAT     LS x-ray with degenerative disc and facet change   ESOPHAGOGASTRODUODENOSCOPY     Negative   FLEXIBLE SIGMOIDOSCOPY N/A 01/16/2021   Procedure: FLEXIBLE SIGMOIDOSCOPY;  Surgeon: Gatha Mayer, MD;  Location: [place];  Service: Endoscopy;  Laterality: N/A;  or unsedated   Hemorrhoid procedure  07/2006   PICC LINE REMOVAL ([place] HX)  08/28/2021   PROCTOSCOPY N/A 05/26/2021   Procedure: RIGID PROCTOSCOPY;  Surgeon: Michael Boston, MD;  Location: [facility];  Service: General;  Laterality: N/A;   RECTOPEXY N/A 05/26/2021   Procedure: RECTOPEXY;  Surgeon: Michael Boston, MD;  Location: [facility];  Service: General;  Laterality: N/A;   TEE WITHOUT CARDIOVERSION N/A 07/27/2021   Procedure: TRANSESOPHAGEAL ECHOCARDIOGRAM (TEE);  Surgeon: Dixie Dials, MD;  Location: [facility];  Service: Cardiovascular;  Laterality: N/A;   TONSILLECTOMY     TOTAL SHOULDER ARTHROPLASTY Left 11/27/2015   Corky Mull, MD   TOTAL SHOULDER REVISION Left 03/16/2016   Procedure: TOTAL SHOULDER REVISION;  Surgeon: Corky Mull, MD;  Location: [facility];  Service:  Orthopedics;  Laterality: Left;   US ECHOCARDIOGRAPHY  10/3708   Normal systolic fxn with EF 62-69%.  Focal basal septal hypertrophy.  Mild diastolic dysfunction.  Mild MR.   XI ROBOTIC ASSISTED LOWER ANTERIOR RESECTION N/A 05/26/2021   Procedure: ROBOTIC LOW ANTERIOR RECTOSIGMOID RESECTION; ASSESSMENT OF TISSUE PERFUSION WITH FIREFLY;  Surgeon: Michael Boston, MD;  Location: [facility];  Service: General;  Laterality: N/A;    Allergies as of 02/23/2022       Reactions   Amlodipine Other (See Comments)   Stomach pains--"feel like my insides are on fire"   Ace Inhibitors Cough   Carvedilol Other (See Comments)   fatigue   Chlorthalidone Other (See Comments)   Severe hyponatremia   Losartan Other (See Comments)   Cr bumped   Other Other (See Comments)   No seeds or  corn because of IBS   Paroxetine Hcl Other (See Comments)   Not effective - felt ill on this medicine   Risedronate Sodium    REACTION: joint pain   Simvastatin    REACTION: the full 20 mg pill causes leg pain- can tol 10 mg   Toprol Xl [metoprolol] Diarrhea   Diarrhea and weakness   Alendronate Sodium Palpitations   Influenza Vaccines Rash, Other (See Comments)   Led to mental breakdown in 1960s   Silenor [doxepin Hcl] Palpitations   Hallucinations and racing heart   Sulfonamide Derivatives Rash        Medication List    acetaminophen 500 MG tablet Commonly known as: TYLENOL Take 500-1,000 mg by mouth every 6 (six) hours as needed for moderate pain. Depends on pain   albuterol 108 (90 Base) MCG/ACT inhaler Commonly known as: VENTOLIN HFA Inhale 2 puffs into the lungs every 6 (six) hours as needed for wheezing or shortness of breath.   Alphagan P 0.1 % Soln Generic drug: brimonidine Place 1 drop into both eyes daily.   Alphagan P 0.1 % Soln Generic drug: brimonidine Apply to eye.   apixaban 5 MG Tabs tablet Commonly known as: ELIQUIS Take 1 tablet (5 mg total) by mouth 2 (two) times daily.    bimatoprost 0.01 % Soln Commonly known as: LUMIGAN Place 1 drop into both eyes at bedtime.   carvedilol 6.25 MG tablet Commonly known as: COREG Take 1 tablet (6.25 mg total) by mouth 2 (two) times daily with a meal.   cholecalciferol 1000 units tablet Commonly known as: VITAMIN D Take 1,000 Units by mouth daily.   clobetasol cream 0.05 % Commonly known as: TEMOVATE Apply 1 Application topically 2 (two) times daily.   clonazePAM 0.5 MG tablet Commonly known as: KLONOPIN TAKE 1 TABLET BY MOUTH AT BEDTIME   colchicine 0.6 MG tablet Take 1 tablet (0.6 mg total) by mouth daily.   fexofenadine 180 MG tablet Commonly known as: ALLEGRA Take 180 mg by mouth daily.   furosemide 20 MG tablet Commonly known as: LASIX Take 1 tablet (20 mg total) by mouth daily.   gabapentin 300 MG capsule Commonly known as: NEURONTIN TAKE 1 CAPSULE BY MOUTH ONCE DAILY AS NEEDED FOR  NERVE  PAIN  (ITCHING)   hydrALAZINE 25 MG tablet Commonly known as: APRESOLINE Take 1 tablet (25 mg total) by mouth in the morning and at bedtime.   Iron 28 MG Tabs Take 1 tablet (28 mg total) by mouth every other day.   levothyroxine 100 MCG tablet Commonly known as: Synthroid Take 1 tablet (100 mcg total) by mouth daily before breakfast.   loperamide 2 MG capsule Commonly known as: IMODIUM Take 1 capsule (2 mg total) by mouth 3 (three) times daily as needed for diarrhea or loose stools.   MULTIVITAMIN ADULT PO Take by mouth daily.   PEPCID COMPLETE PO Take 1 tablet by mouth daily as needed (indigestion).   polyethylene glycol 17 g packet Commonly known as: MIRALAX / GLYCOLAX Take 17 g by mouth daily.   Potassium 99 MG Tabs Take 1 tablet (99 mg total) by mouth every Monday, Wednesday, and Friday.   PROBIOTIC DAILY PO Take by mouth.   Simethicone 180 MG Caps Take 1 capsule by mouth daily as needed (indigestion).   spironolactone 25 MG tablet Commonly known as: ALDACTONE Take 1 tablet by mouth  daily   VITAMIN C PO Take by mouth.        Review  of systems negative except as noted in HPI / PMHx or noted below:  Review of Systems  Constitutional: Negative.   HENT: Negative.    Eyes: Negative.   Respiratory: Negative.    Cardiovascular: Negative.   Gastrointestinal: Negative.   Genitourinary: Negative.   Musculoskeletal: Negative.   Skin: Negative.   Neurological: Negative.   Endo/Heme/Allergies: Negative.   Psychiatric/Behavioral: Negative.      Family History  Problem Relation Age of Onset   Diabetes Brother        post-op   Coronary artery disease Brother    Diabetes Paternal Grandfather    Breast cancer Cousin     Social History   Socioeconomic History   Marital status: Widowed    Spouse name: Not on file   Number of children: 2   Years of education: Not on file   Highest education level: Not on file  Occupational History   Occupation: Retired    Fish farm manager: RETIRED  Tobacco Use   Smoking status: Never   Smokeless tobacco: Never  Vaping Use   Vaping Use: Never used  Substance and Sexual Activity   Alcohol use: No   Drug use: No   Sexual activity: Not Currently  Other Topics Concern   Not on file  Social History Narrative   Left handed   Widow. Husband (Brooke Beasley) deceased 2016-03-13 from dementia. She was caregiver.    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   Activity: no regular exercise   Diet: good water, fruits/vegetables daily      Patient Care Team:   [name], MD as PCP - General (Family Medicine)   Kate Sable, MD as PCP - Cardiology (Cardiology)   Dannielle Karvonen, RN as [place] Management   Gross, Remo Lipps, MD as Consulting Physician (General Surgery)   Lin Landsman, MD as Consulting Physician (Gastroenterology)   Kate Sable, MD as Consulting Physician (Cardiology)   Social Determinants of Health   Financial  Resource Strain: Low Risk  (03/30/2021)   Overall Financial Resource Strain (CARDIA)    Difficulty of Paying Living Expenses: Not very hard  Food Insecurity: No Food Insecurity (03/30/2021)   Hunger Vital Sign    Worried About Running Out of Food in the Last Year: Never true    [place] in the Last Year: Never true  Transportation Needs: No Transportation Needs (03/30/2021)   PRAPARE - Transportation    Lack of Transportation (Medical): No    Lack of Transportation (Non-Medical): No  Physical Activity: Insufficiently Active (03/30/2021)   Exercise Vital Sign    Days of Exercise per Week: 7 days    Minutes of Exercise per Session: 10 min  Stress: No Stress Concern Present (03/30/2021)   [place]    Feeling of Stress : Only a little  Social Connections: Socially Isolated (03/30/2021)   Social Connection and Isolation Panel [NHANES]    Frequency of Communication with Friends and Family: More than three times a week    Frequency of Social Gatherings with Friends and Family: Three times a week    Attends Religious Services: Never    Active Member of Clubs or Organizations: No    Attends Archivist Meetings: Never    Marital Status: Widowed  Intimate Partner Violence: Not At Risk (03/30/2021)   Humiliation, Afraid, Rape, and Kick questionnaire  Fear of Current or Ex-Partner: No    Emotionally Abused: No    Physically Abused: No    Sexually Abused: No    Environmental and Social history  Lives in a house with a dry environment, no animals located inside the household, in the bedroom, plastic on the bed, plastic on the pillow, no smoking ongoing with inside the household.  Objective:   Vitals:   02/23/22 1357  BP: (!) 154/76  Pulse: 60  Resp: 16  Temp: 97.8 F (36.6 C)  SpO2: 96%   Height: 5' (152.4 cm) Weight: 156 lb (70.8 kg)  Physical Exam Constitutional:      Appearance: She is not  diaphoretic.  HENT:     Head: Normocephalic.     Right Ear: Tympanic membrane, ear canal and external ear normal.     Left Ear: Tympanic membrane, ear canal and external ear normal.     Nose: Nose normal. No mucosal edema or rhinorrhea.     Mouth/Throat:     Pharynx: Uvula midline. No oropharyngeal exudate.  Eyes:     Conjunctiva/sclera: Conjunctivae normal.  Neck:     Thyroid: No thyromegaly.     Trachea: Trachea normal. No tracheal tenderness or tracheal deviation.  Cardiovascular:     Rate and Rhythm: Normal rate and regular rhythm.     Heart sounds: S1 normal and S2 normal. Murmur (Systolic) heard.  Pulmonary:     Effort: No respiratory distress.     Breath sounds: Normal breath sounds. No stridor. No wheezing or rales.  Lymphadenopathy:     Head:     Right side of head: No tonsillar adenopathy.     Left side of head: No tonsillar adenopathy.     Cervical: No cervical adenopathy.  Skin:    Findings: No erythema or rash.     Nails: There is no clubbing.  Neurological:     Mental Status: She is alert.     Diagnostics: Allergy skin tests were performed.   Results of blood tests obtained 08 June 2021 identifies creatinine 1.14 MGs/DL, AST 24 U/L, ALT 20 U/L, TSH 0.12 IU/mL, free T41.27 NG/DL,  Results of blood tests obtained 18 December 2021 identifies WBC 7.3, absolute eosinophil 100, absolute lymphocyte 1000, hemoglobin 14.0, platelet 260, sed rate 29, ANA positive at a titer of 1: 40, negative RA, negative transglutaminase IgA.  Assessment and Plan:    No diagnosis found.  There are no Patient Instructions on file for this visit.   Jiles Prows, MD Allergy / Immunology [facility]

## 2022-02-24 ENCOUNTER — Encounter: Payer: Self-pay | Admitting: Allergy and Immunology

## 2022-02-24 MED ORDER — OMALIZUMAB 150 MG ~~LOC~~ SOLR
300.0000 mg | SUBCUTANEOUS | Status: DC
  Administered 2022-04-09 – 2022-06-04 (×3): 300 mg via SUBCUTANEOUS

## 2022-03-08 ENCOUNTER — Telehealth: Payer: Self-pay

## 2022-03-08 ENCOUNTER — Telehealth: Payer: Self-pay | Admitting: *Deleted

## 2022-03-08 NOTE — Telephone Encounter (Signed)
Patient's daughter called stating the patient had slurred speech on the morning, drowsiness at the beginning of using the singular.

## 2022-03-08 NOTE — Telephone Encounter (Signed)
Saw where patient has appt for Xolair for 11/28 an per Dr Neldon Mc started therapy on 10/31 vbisit although no inj record, mar or note to me regarding same. I reached out to patient and discussed patient assistance for Xolair and will mail out consent form to return to me

## 2022-03-14 ENCOUNTER — Other Ambulatory Visit: Payer: Self-pay | Admitting: Family Medicine

## 2022-03-14 DIAGNOSIS — E039 Hypothyroidism, unspecified: Secondary | ICD-10-CM

## 2022-03-15 ENCOUNTER — Other Ambulatory Visit (INDEPENDENT_AMBULATORY_CARE_PROVIDER_SITE_OTHER): Payer: PPO

## 2022-03-15 DIAGNOSIS — E039 Hypothyroidism, unspecified: Secondary | ICD-10-CM

## 2022-03-15 LAB — T4, FREE: Free T4: 0.98 ng/dL (ref 0.60–1.60)

## 2022-03-15 LAB — TSH: TSH: 1.03 u[IU]/mL (ref 0.35–5.50)

## 2022-03-16 DIAGNOSIS — J9811 Atelectasis: Secondary | ICD-10-CM | POA: Diagnosis not present

## 2022-03-16 DIAGNOSIS — R918 Other nonspecific abnormal finding of lung field: Secondary | ICD-10-CM | POA: Diagnosis not present

## 2022-03-16 DIAGNOSIS — R0602 Shortness of breath: Secondary | ICD-10-CM | POA: Diagnosis not present

## 2022-03-16 DIAGNOSIS — M11211 Other chondrocalcinosis, right shoulder: Secondary | ICD-10-CM | POA: Diagnosis not present

## 2022-03-17 ENCOUNTER — Telehealth: Payer: Self-pay | Admitting: Allergy and Immunology

## 2022-03-17 NOTE — Telephone Encounter (Signed)
Patient has an appointment on 12/5 to see Dr. Neldon Mc, she will hold off on her injections until then.

## 2022-03-17 NOTE — Telephone Encounter (Signed)
Patient states after receiving her Xolair injections her bones felt very achy especially her shoulders and back. She has also experienced some shortness of breath. She wants to know if this is normal after taking Xolair. She's not sure if she should continue them.

## 2022-03-20 DIAGNOSIS — I5032 Chronic diastolic (congestive) heart failure: Secondary | ICD-10-CM | POA: Diagnosis not present

## 2022-03-23 ENCOUNTER — Ambulatory Visit: Payer: PPO

## 2022-03-24 ENCOUNTER — Other Ambulatory Visit: Payer: Self-pay | Admitting: Family Medicine

## 2022-03-25 NOTE — Telephone Encounter (Signed)
Gabapentin Last filled 02/21/22, #30 Last OV:  01/01/22, 3 mo f/I Next OV:  04/20/22, CPE

## 2022-03-26 DIAGNOSIS — R531 Weakness: Secondary | ICD-10-CM | POA: Diagnosis not present

## 2022-03-26 DIAGNOSIS — G4733 Obstructive sleep apnea (adult) (pediatric): Secondary | ICD-10-CM | POA: Diagnosis not present

## 2022-03-27 ENCOUNTER — Other Ambulatory Visit: Payer: Self-pay | Admitting: Family Medicine

## 2022-03-29 ENCOUNTER — Ambulatory Visit (INDEPENDENT_AMBULATORY_CARE_PROVIDER_SITE_OTHER): Payer: PPO | Admitting: Gastroenterology

## 2022-03-29 ENCOUNTER — Encounter: Payer: Self-pay | Admitting: Gastroenterology

## 2022-03-29 VITALS — BP 120/70 | HR 79 | Temp 98.1°F | Ht 60.0 in | Wt 161.2 lb

## 2022-03-29 DIAGNOSIS — L299 Pruritus, unspecified: Secondary | ICD-10-CM | POA: Diagnosis not present

## 2022-03-29 NOTE — Telephone Encounter (Signed)
ERx 

## 2022-03-29 NOTE — Progress Notes (Signed)
Cephas Darby, MD [address]  [place]  [place], [place] [address]  Main: [phone]  Fax: [phone]    Gastroenterology Consultation  Referring Provider:     [name], MD Primary Care Physician:  [name], MD Primary Gastroenterologist:  Dr. Cephas Darby Reason for Consultation: Generalized pruritus  HPI:   Kaelan Emami is a 88 y.o. female referred by Dr. [name], MD  for consultation & management of generalized pruritus.  This has started after blood transfusion in 12/2020.  She denies any jaundice, dark urine, clay colored stools, abdominal distention.  She denies any rash.  She was recently evaluated by allergy and immunology, received 1 dose of omalizumab on 02/23/2022 for treatment of allergic urticaria.  No evidence of chronic liver disease or cirrhosis.  Her LFTs as of October 2023 were normal.  She has history of history of recurrent C. difficile diarrhea s/p Zinplava in 2021.  Patient underwent robotic low anterior rectosigmoid resection and rectopexy for rectal prolapse 05/26/2021.  She had history of L5-S1 discitis secondary to Proteus/Citrobacter, treated with antibiotics via PICC line.  Patient does not have any GI concerns today.   NSAIDs: None  Antiplts/Anticoagulants/Anti thrombotics: None  GI Procedures:  Colonoscopy 09/10/2019 - Hemorrhoids found on perianal exam. - Diverticulosis in the sigmoid colon. - Two 5 to 6 mm polyps in the ascending colon and in the cecum, removed with a cold snare. Resected and retrieved. - Rectal prolapse. - Non-bleeding external hemorrhoids. DIAGNOSIS:  A. COLON POLYP X2, CECUM; COLD SNARE:  - SESSILE SERRATED POLYP WITH DYSPLASIA (1 FRAGMENT).  - SESSILE SERRATED POLYP (3 FRAGMENTS).  - TUBULAR ADENOMA (2 FRAGMENTS).  - NEGATIVE FOR HIGH-GRADE DYSPLASIA AND MALIGNANCY.   Past Medical History:  Diagnosis Date   Anxiety    C. difficile diarrhea 07/11/2018   S/p multiple  oral vanc treatments (course and tapers) and completed Zinplava monoclonal Ab treatment (05/10/2019) Fecal transplant on hold during COVID pandemic   CAP (community acquired pneumonia) 12/22/2017   Carotid stenosis 10/18/2014   R 1-39%, L 40-59%, rpt 1 yr (09/2014)    CKD (chronic kidney disease) stage 3, GFR 30-59 ml/min (HCC) 08/02/2014   Closed fracture of right orbit ([place]) 02/10/2021   Clostridioides difficile infection    hx 2020   COVID-19 virus infection 10/13/2020   Degenerative disc disease    LS   Depression    nervous breakdonw in 1964-out of work for a year   Diverticulosis    severe by colonoscopy   Family history of adverse reaction to anesthesia    n/v   GERD (gastroesophageal reflux disease)    Glaucoma    Heart murmur 08/2011   mitral regurge - on echo    History of hiatal hernia    History of shingles    HTN (hypertension)    Hyperlipidemia    Hypertension    Hypothyroidism    IBS 11/02/2006   Intestinal bacterial overgrowth    In small colon   Left shoulder pain 11/04/2014   Lower GI bleed 12/2020   thought diverticular complicated by ABLA with syncope and orbital fracture s/p hospitalization   Maxillary fracture ([place]) 04/08/2012   Osteoarthritis 2016   Jefm Bryant)   Osteoporosis    dexa 2011   Perirectal abscess 08/03/2021   CT guided aspiration of abscess grow Proteums mirabilis and Citrobacter koseri 07/2021, currently receiving IV Zosyn planned total 6 wks.   Pseudogout 2016   shoulders ([place])  Past Surgical History:  Procedure Laterality Date   barium enema  2012   severe diverticulosis, redundant colon   Bowel obstruction  1999   no surgery in hosp x 3 days   BUBBLE STUDY  07/27/2021   Procedure: BUBBLE STUDY;  Surgeon: Dixie Dials, MD;  Location: [facility];  Service: Cardiovascular;;   CARDIOVERSION N/A 11/19/2021   Procedure: CARDIOVERSION;  Surgeon: Kate Sable, MD;  Location: [facility];  Service: Cardiovascular;  Laterality:  N/A;   COLONOSCOPY  1. 1999  2. 11/04   1. Not finished  2. Slight hemorrhage rectosigmoid area, severe sig diverticulosis   COLONOSCOPY WITH PROPOFOL N/A 09/10/2019   SSP with dysplasia, TA, rpt 3 yrs Marius Ditch, Tally Due, MD)   Dexa  1. [phone]   2. 9/04  3. 3/08    1. OP  2. OP, borderline, spine -2.44T  3. decreased BMD-OP   DG KNEE 1-2 VIEWS BILAT     LS x-ray with degenerative disc and facet change   ESOPHAGOGASTRODUODENOSCOPY     Negative   FLEXIBLE SIGMOIDOSCOPY N/A 01/16/2021   Procedure: FLEXIBLE SIGMOIDOSCOPY;  Surgeon: Gatha Mayer, MD;  Location: [facility];  Service: Endoscopy;  Laterality: N/A;  or unsedated   Hemorrhoid procedure  07/2006   PICC LINE REMOVAL ([facility] HX)  08/28/2021   PROCTOSCOPY N/A 05/26/2021   Procedure: RIGID PROCTOSCOPY;  Surgeon: Michael Boston, MD;  Location: [facility];  Service: General;  Laterality: N/A;   RECTOPEXY N/A 05/26/2021   Procedure: RECTOPEXY;  Surgeon: Michael Boston, MD;  Location: [facility];  Service: General;  Laterality: N/A;   TEE WITHOUT CARDIOVERSION N/A 07/27/2021   Procedure: TRANSESOPHAGEAL ECHOCARDIOGRAM (TEE);  Surgeon: Dixie Dials, MD;  Location: [facility];  Service: Cardiovascular;  Laterality: N/A;   TONSILLECTOMY     TOTAL SHOULDER ARTHROPLASTY Left 11/27/2015   Corky Mull, MD   TOTAL SHOULDER REVISION Left 03/16/2016   Procedure: TOTAL SHOULDER REVISION;  Surgeon: Corky Mull, MD;  Location: [facility];  Service: Orthopedics;  Laterality: Left;   US ECHOCARDIOGRAPHY  13/2440   Normal systolic fxn with EF 10-27%.  Focal basal septal hypertrophy.  Mild diastolic dysfunction.  Mild MR.   XI ROBOTIC ASSISTED LOWER ANTERIOR RESECTION N/A 05/26/2021   Procedure: ROBOTIC LOW ANTERIOR RECTOSIGMOID RESECTION; ASSESSMENT OF TISSUE PERFUSION WITH FIREFLY;  Surgeon: Michael Boston, MD;  Location: [facility];  Service: General;  Laterality: N/A;    Current Outpatient Medications:    acetaminophen (TYLENOL) 500 MG tablet, Take  500-1,000 mg by mouth every 6 (six) hours as needed for moderate pain. Depends on pain, Disp: , Rfl:    ALPHAGAN P 0.1 % SOLN, Place 1 drop into both eyes daily., Disp: , Rfl:    apixaban (ELIQUIS) 5 MG TABS tablet, Take 1 tablet (5 mg total) by mouth 2 (two) times daily., Disp: 60 tablet, Rfl: 6   Ascorbic Acid (VITAMIN C PO), Take by mouth., Disp: , Rfl:    bimatoprost (LUMIGAN) 0.01 % SOLN, Place 1 drop into both eyes at bedtime., Disp: , Rfl:    brimonidine (ALPHAGAN P) 0.1 % SOLN, Apply to eye., Disp: , Rfl:    carvedilol (COREG) 6.25 MG tablet, Take 1 tablet (6.25 mg total) by mouth 2 (two) times daily with a meal., Disp: 180 tablet, Rfl: 1   cetirizine (ZYRTEC) 10 MG tablet, Take 1 tablet (10 mg total) by mouth daily., Disp: 30 tablet, Rfl: 5   cholecalciferol (VITAMIN D) 1000 units tablet, Take 1,000 Units  by mouth daily., Disp: , Rfl:    clobetasol cream (TEMOVATE) 8.67 %, Apply 1 Application topically 2 (two) times daily., Disp: 30 g, Rfl: 0   clonazePAM (KLONOPIN) 0.5 MG tablet, TAKE 1 TABLET BY MOUTH AT BEDTIME, Disp: 30 tablet, Rfl: 0   colchicine 0.6 MG tablet, Take 1 tablet (0.6 mg total) by mouth daily., Disp: 90 tablet, Rfl: 1   famotidine (PEPCID) 20 MG tablet, Take 1 tablet (20 mg total) by mouth 2 (two) times daily., Disp: 60 tablet, Rfl: 5   Famotidine-Ca Carb-Mag Hydrox (PEPCID COMPLETE PO), Take 1 tablet by mouth daily as needed (indigestion)., Disp: , Rfl:    Ferrous Sulfate (IRON) 28 MG TABS, Take 1 tablet (28 mg total) by mouth every other day., Disp: , Rfl:    fexofenadine (ALLEGRA) 180 MG tablet, Take 180 mg by mouth daily., Disp: , Rfl:    furosemide (LASIX) 20 MG tablet, Take 1 tablet (20 mg total) by mouth daily., Disp: 30 tablet, Rfl: 6   gabapentin (NEURONTIN) 300 MG capsule, TAKE 1 CAPSULE BY MOUTH ONCE DAILY AS NEEDED FOR  NERVE  PAIN  (ITCHING), Disp: 30 capsule, Rfl: 0   hydrALAZINE (APRESOLINE) 25 MG tablet, Take 1 tablet (25 mg total) by mouth in the morning  and at bedtime., Disp: 180 tablet, Rfl: 1   levothyroxine (SYNTHROID) 100 MCG tablet, Take 1 tablet (100 mcg total) by mouth daily before breakfast., Disp: 90 tablet, Rfl: 1   loperamide (IMODIUM) 2 MG capsule, Take 1 capsule (2 mg total) by mouth 3 (three) times daily as needed for diarrhea or loose stools., Disp: 30 capsule, Rfl: 0   montelukast (SINGULAIR) 10 MG tablet, Take 1 tablet (10 mg total) by mouth at bedtime., Disp: 30 tablet, Rfl: 5   Multiple Vitamin (MULTIVITAMIN ADULT PO), Take by mouth daily., Disp: , Rfl:    polyethylene glycol (MIRALAX / GLYCOLAX) 17 g packet, Take 17 g by mouth daily., Disp: , Rfl:    Potassium 99 MG TABS, Take 1 tablet (99 mg total) by mouth every Monday, Wednesday, and Friday., Disp: 330 tablet, Rfl:    Probiotic Product (PROBIOTIC DAILY PO), Take by mouth., Disp: , Rfl:    Simethicone 180 MG CAPS, Take 1 capsule by mouth daily as needed (indigestion)., Disp: , Rfl:    spironolactone (ALDACTONE) 25 MG tablet, Take 1 tablet by mouth daily, Disp: 90 tablet, Rfl: 2   albuterol (PROVENTIL HFA;VENTOLIN HFA) 108 (90 Base) MCG/ACT inhaler, Inhale 2 puffs into the lungs every 6 (six) hours as needed for wheezing or shortness of breath. (Patient not taking: Reported on 02/23/2022), Disp: 1 Inhaler, Rfl: 0  Current Facility-Administered Medications:    omalizumab Arvid Right) injection 300 mg, 300 mg, Subcutaneous, Q28 days, Kozlow, Donnamarie Poag, MD   Family History  Problem Relation Age of Onset   Diabetes Brother        post-op   Coronary artery disease Brother    Diabetes Paternal Grandfather    Breast cancer Cousin      Social History   Tobacco Use   Smoking status: Never   Smokeless tobacco: Never  Vaping Use   Vaping Use: Never used  Substance Use Topics   Alcohol use: No   Drug use: No    Allergies as of 03/29/2022 - Review Complete 03/29/2022  Allergen Reaction Noted   Amlodipine Other (See Comments) 05/16/2020   Ace inhibitors Cough 04/02/2008    Carvedilol Other (See Comments) 11/06/2020   Chlorthalidone Other (See Comments)  09/26/2021   Losartan Other (See Comments) 09/26/2014   Montelukast Other (See Comments) 03/16/2022   Other Other (See Comments) 11/12/2015   Paroxetine hcl Other (See Comments) 06/15/2011   Risedronate sodium  03/13/2007   Simvastatin  10/03/2009   Toprol xl [metoprolol] Diarrhea 08/03/2019   Alendronate sodium Palpitations 09/14/2006   Influenza vaccines Rash and Other (See Comments) 05/24/2014   Silenor [doxepin hcl] Palpitations 04/02/2013   Sulfonamide derivatives Rash 06/08/2006    Review of Systems:    All systems reviewed and negative except where noted in HPI.   Physical Exam:  BP 120/70 (BP Location: Left Arm, Patient Position: Sitting, Cuff Size: Normal)   Pulse 79   Temp 98.1 F (36.7 C) (Oral)   Ht 5' (1.524 m)   Wt 161 lb 4 oz (73.1 kg)   BMI 31.49 kg/m  No LMP recorded. Patient is postmenopausal.  General:   Alert,  Well-developed, well-nourished, pleasant and cooperative in NAD Head:  Normocephalic and atraumatic. Eyes:  Sclera clear, no icterus.   Conjunctiva pink. Ears:  Normal auditory acuity. Nose:  No deformity, discharge, or lesions. Mouth:  No deformity or lesions,oropharynx pink & moist. Neck:  Supple; no masses or thyromegaly. Lungs:  Respirations even and unlabored.  Clear throughout to auscultation.   No wheezes, crackles, or rhonchi. No acute distress. Heart:  Regular rate and rhythm; no murmurs, clicks, rubs, or gallops. Abdomen:  Normal bowel sounds. Soft, non-tender and non-distended without masses, hepatosplenomegaly or hernias noted.  No guarding or rebound tenderness.   Rectal: Not performed Msk:  Symmetrical without gross deformities. Good, equal movement & strength bilaterally. Pulses:  Normal pulses noted. Extremities:  No clubbing or edema.  No cyanosis. Neurologic:  Alert and oriented x3;  grossly normal neurologically. Skin:  Intact without significant  lesions or rashes. No jaundice. Psych:  Alert and cooperative. Normal mood and affect.  Imaging Studies: Reviewed  Assessment and Plan:   Sage Hammill is a 88 y.o. female with history of chronic constipation, sigmoid diverticulosis, chronic abdominal bloating, antibiotic associated C. difficile diarrhea in 05/2018 s/p 10days of oral vancomycin, 1st recurrence in 06/2018, treated with 2 weeks of Dificid with prolonged taper.  Second recurrence in 09/2018, treated with long-term oral vancomycin.  C. difficile toxin returned positive on 11/14/2018 while on oral vancomycin, s/p Zinplava in 04/2019 with resolution of diarrhea. She has intermittent rectal bleeding that resulted in anemia, underwent hemorrhoid ligation x4 in 2022.  Acute lower GI bleed in 12/2020 requiring hospitalization and blood transfusion, thought to be secondary to diverticular source.  She underwent robotic assisted rectosigmoid resection and rectopexy for rectal prolapse in 04/2021.  She has generalized pruritus for last 1 year. No evidence of chronic liver disease, jaundice or elevated alkaline phosphatase levels to suggest any hepatobiliary cause Manage as per allergy and immunology recommendations  Follow up as needed and contact via MyChart   Cephas Darby, MD

## 2022-03-29 NOTE — Telephone Encounter (Signed)
Name of Medication: Clonazepam Name of Pharmacy: [place] or Written Date and Quantity: 02/22/22, #30 Last Office Visit and Type: 01/01/22, 3 mo f/u Next Office Visit and Type: 04/20/22, CPE Last Controlled Substance Agreement Date: 09/26/14 Last UDS: 09/26/14

## 2022-03-30 ENCOUNTER — Ambulatory Visit (INDEPENDENT_AMBULATORY_CARE_PROVIDER_SITE_OTHER): Payer: PPO | Admitting: Allergy and Immunology

## 2022-03-30 VITALS — BP 160/88 | HR 54 | Temp 99.2°F | Resp 16 | Ht 61.0 in | Wt 161.1 lb

## 2022-03-30 DIAGNOSIS — L501 Idiopathic urticaria: Secondary | ICD-10-CM

## 2022-03-30 DIAGNOSIS — L299 Pruritus, unspecified: Secondary | ICD-10-CM

## 2022-03-30 NOTE — Patient Instructions (Signed)
  1.  Allergen avoidance measures - ginger  2.  Omalizumab injection today and every 4 weeks  3.  Use a combination of the following every day:   A.  Cetirizine 10 mg - 1 tablet twice a day  B.  Famotidine 20 mg - 1 tablet twice a day  4.  Return to clinic in 8 weeks or earlier if problem

## 2022-03-30 NOTE — Progress Notes (Unsigned)
[place]   Follow-up Note  Referring Provider: [name], MD Primary Provider: [name], MD Date of Office Visit: 03/30/2022  Subjective:   Brooke Beasley (DOB: 01/22/1931) is a 88 y.o. female who returns to the [facility] on 03/30/2022 in re-evaluation of the following:  HPI: Brooke Beasley returns to this clinic in evaluation of urticaria and pruritic disorder addressed during her initial evaluation of 23 February 2022.  During her last visit we started her on a combination of H1 receptor blocker, H2 receptor blocker, leukotriene modifier, and gave her a dose of omalizumab.  Within a few days of taking the medicine she had confusion and slurred speech and nausea that lasted about 2 weeks. Around the same point in time she got a heavy chest sensation and some cough and some shoulder aches with a temperature of about 99.X and she went to see her rheumatologist who checked a C-reactive protein which was apparently elevated and she had a chest x-ray which was normal.  She was given prednisone for 10 days which helped all of her symptoms.  As a result of the therapy administered during her last visit her itchiness almost went completely away.  Over the course of the past few days it is returned.  Allergies as of 03/30/2022       Reactions   Amlodipine Other (See Comments)   Stomach pains--"feel like my insides are on fire"   Ace Inhibitors Cough   Carvedilol Other (See Comments)   fatigue   Chlorthalidone Other (See Comments)   Severe hyponatremia   Losartan Other (See Comments)   Cr bumped   Montelukast Other (See Comments)   Slurred speech; really drowsy   Other Other (See Comments)   No seeds or corn because of IBS   Paroxetine Hcl Other (See Comments)   Not effective - felt ill on this medicine   Risedronate Sodium    REACTION: joint pain   Simvastatin    REACTION: the full 20 mg pill causes  leg pain- can tol 10 mg   Toprol Xl [metoprolol] Diarrhea   Diarrhea and weakness   Alendronate Sodium Palpitations   Influenza Vaccines Rash, Other (See Comments)   Led to mental breakdown in 1960s   Silenor [doxepin Hcl] Palpitations   Hallucinations and racing heart   Sulfonamide Derivatives Rash        Medication List    Alphagan P 0.1 % Soln Generic drug: brimonidine Place 1 drop into both eyes daily.   apixaban 5 MG Tabs tablet Commonly known as: ELIQUIS Take 1 tablet (5 mg total) by mouth 2 (two) times daily.   bimatoprost 0.01 % Soln Commonly known as: LUMIGAN Place 1 drop into both eyes at bedtime.   carvedilol 6.25 MG tablet Commonly known as: COREG Take 1 tablet (6.25 mg total) by mouth 2 (two) times daily with a meal.   cetirizine 10 MG tablet Commonly known as: ZYRTEC Take 1 tablet (10 mg total) by mouth daily.   cholecalciferol 1000 units tablet Commonly known as: VITAMIN D Take 1,000 Units by mouth daily.   clobetasol cream 0.05 % Commonly known as: TEMOVATE Apply 1 Application topically 2 (two) times daily.   clonazePAM 0.5 MG tablet Commonly known as: KLONOPIN TAKE 1 TABLET BY MOUTH AT BEDTIME   colchicine 0.6 MG tablet Take 1 tablet (0.6 mg total) by mouth daily.   famotidine 20 MG tablet Commonly known as: PEPCID  Take 1 tablet (20 mg total) by mouth 2 (two) times daily.   furosemide 20 MG tablet Commonly known as: LASIX Take 1 tablet (20 mg total) by mouth daily.   gabapentin 300 MG capsule Commonly known as: NEURONTIN TAKE 1 CAPSULE BY MOUTH ONCE DAILY AS NEEDED FOR  NERVE  PAIN  (ITCHING)   hydrALAZINE 25 MG tablet Commonly known as: APRESOLINE Take 1 tablet (25 mg total) by mouth in the morning and at bedtime.   Iron 28 MG Tabs Take 1 tablet (28 mg total) by mouth every other day.   levothyroxine 100 MCG tablet Commonly known as: Synthroid Take 1 tablet (100 mcg total) by mouth daily before breakfast.   loperamide 2 MG  capsule Commonly known as: IMODIUM Take 1 capsule (2 mg total) by mouth 3 (three) times daily as needed for diarrhea or loose stools.   MULTIVITAMIN ADULT PO Take by mouth daily.   polyethylene glycol 17 g packet Commonly known as: MIRALAX / GLYCOLAX Take 17 g by mouth daily.   Potassium 99 MG Tabs Take 1 tablet (99 mg total) by mouth every Monday, Wednesday, and Friday.   PROBIOTIC DAILY PO Take by mouth.   Simethicone 180 MG Caps Take 1 capsule by mouth daily as needed (indigestion).   spironolactone 25 MG tablet Commonly known as: ALDACTONE Take 1 tablet by mouth daily   VITAMIN C PO Take by mouth.        Past Medical History:  Diagnosis Date   Anxiety    C. difficile diarrhea 07/11/2018   S/p multiple oral vanc treatments (course and tapers) and completed Zinplava monoclonal Ab treatment (05/10/2019) Fecal transplant on hold during COVID pandemic   CAP (community acquired pneumonia) 12/22/2017   Carotid stenosis 10/18/2014   R 1-39%, L 40-59%, rpt 1 yr (09/2014)    CKD (chronic kidney disease) stage 3, GFR 30-59 ml/min (HCC) 08/02/2014   Closed fracture of right orbit ([place]) 02/10/2021   Clostridioides difficile infection    hx 2020   COVID-19 virus infection 10/13/2020   Degenerative disc disease    LS   Depression    nervous breakdonw in 1964-out of work for a year   Diverticulosis    severe by colonoscopy   Family history of adverse reaction to anesthesia    n/v   GERD (gastroesophageal reflux disease)    Glaucoma    Heart murmur 08/2011   mitral regurge - on echo    History of hiatal hernia    History of shingles    HTN (hypertension)    Hyperlipidemia    Hypertension    Hypothyroidism    IBS 11/02/2006   Intestinal bacterial overgrowth    In small colon   Left shoulder pain 11/04/2014   Lower GI bleed 12/2020   thought diverticular complicated by ABLA with syncope and orbital fracture s/p hospitalization   Maxillary fracture ([place]) 04/08/2012    Osteoarthritis 2016   Jefm [facility])   Osteoporosis    dexa 2011   Perirectal abscess 08/03/2021   CT guided aspiration of abscess grow Proteums mirabilis and Citrobacter koseri 07/2021, currently receiving IV Zosyn planned total 6 wks.   Pseudogout 2016   shoulders ([place])    Past Surgical History:  Procedure Laterality Date   barium enema  2012   severe diverticulosis, redundant colon   Bowel obstruction  1999   no surgery in hosp x 3 days   BUBBLE STUDY  07/27/2021   Procedure: BUBBLE STUDY;  Surgeon: [name],  Vicenta Aly, MD;  Location: [facility];  Service: Cardiovascular;;   CARDIOVERSION N/A 11/19/2021   Procedure: CARDIOVERSION;  Surgeon: Kate Sable, MD;  Location: [facility];  Service: Cardiovascular;  Laterality: N/A;   COLONOSCOPY  1. 1999  2. 11/04   1. Not finished  2. Slight hemorrhage rectosigmoid area, severe sig diverticulosis   COLONOSCOPY WITH PROPOFOL N/A 09/10/2019   SSP with dysplasia, TA, rpt 3 yrs Marius Ditch, Tally Due, MD)   Dexa  1. [phone]   2. 9/04  3. 3/08    1. OP  2. OP, borderline, spine -2.44T  3. decreased BMD-OP   DG KNEE 1-2 VIEWS BILAT     LS x-ray with degenerative disc and facet change   ESOPHAGOGASTRODUODENOSCOPY     Negative   FLEXIBLE SIGMOIDOSCOPY N/A 01/16/2021   Procedure: FLEXIBLE SIGMOIDOSCOPY;  Surgeon: Gatha Mayer, MD;  Location: [facility];  Service: Endoscopy;  Laterality: N/A;  or unsedated   Hemorrhoid procedure  07/2006   PICC LINE REMOVAL ([place] HX)  08/28/2021   PROCTOSCOPY N/A 05/26/2021   Procedure: RIGID PROCTOSCOPY;  Surgeon: Michael Boston, MD;  Location: [facility];  Service: General;  Laterality: N/A;   RECTOPEXY N/A 05/26/2021   Procedure: RECTOPEXY;  Surgeon: Michael Boston, MD;  Location: [facility];  Service: General;  Laterality: N/A;   TEE WITHOUT CARDIOVERSION N/A 07/27/2021   Procedure: TRANSESOPHAGEAL ECHOCARDIOGRAM (TEE);  Surgeon: Dixie Dials, MD;  Location: [facility];  Service: Cardiovascular;  Laterality:  N/A;   TONSILLECTOMY     TOTAL SHOULDER ARTHROPLASTY Left 11/27/2015   Corky Mull, MD   TOTAL SHOULDER REVISION Left 03/16/2016   Procedure: TOTAL SHOULDER REVISION;  Surgeon: Corky Mull, MD;  Location: [facility];  Service: Orthopedics;  Laterality: Left;   US ECHOCARDIOGRAPHY  38/1829   Normal systolic fxn with EF 93-71%.  Focal basal septal hypertrophy.  Mild diastolic dysfunction.  Mild MR.   XI ROBOTIC ASSISTED LOWER ANTERIOR RESECTION N/A 05/26/2021   Procedure: ROBOTIC LOW ANTERIOR RECTOSIGMOID RESECTION; ASSESSMENT OF TISSUE PERFUSION WITH FIREFLY;  Surgeon: Michael Boston, MD;  Location: [facility];  Service: General;  Laterality: N/A;    Review of systems negative except as noted in HPI / PMHx or noted below:  Review of Systems  Constitutional: Negative.   HENT: Negative.    Eyes: Negative.   Respiratory: Negative.    Cardiovascular: Negative.   Gastrointestinal: Negative.   Genitourinary: Negative.   Musculoskeletal: Negative.   Skin: Negative.   Neurological: Negative.   Endo/Heme/Allergies: Negative.   Psychiatric/Behavioral: Negative.       Objective:   Vitals:   03/30/22 1341  BP: (!) 160/88  Pulse: (!) 54  Resp: 16  Temp: 99.2 F (37.3 C)  SpO2: 95%   Height: '5\' 1"'$  (154.9 cm)  Weight: 161 lb 1.6 oz (73.1 kg)   Physical Exam Constitutional:      Appearance: She is not diaphoretic.  HENT:     Head: Normocephalic.     Right Ear: Tympanic membrane, ear canal and external ear normal.     Left Ear: Tympanic membrane, ear canal and external ear normal.     Nose: Nose normal. No mucosal edema or rhinorrhea.     Mouth/Throat:     Pharynx: Uvula midline. No oropharyngeal exudate.  Eyes:     Conjunctiva/sclera: Conjunctivae normal.  Neck:     Thyroid: No thyromegaly.     Trachea: Trachea normal. No tracheal tenderness or tracheal deviation.  Cardiovascular:  Rate and Rhythm: Normal rate and regular rhythm.     Heart sounds: Normal heart sounds, S1  normal and S2 normal. No murmur heard. Pulmonary:     Effort: No respiratory distress.     Breath sounds: Normal breath sounds. No stridor. No wheezing or rales.  Lymphadenopathy:     Head:     Right side of head: No tonsillar adenopathy.     Left side of head: No tonsillar adenopathy.     Cervical: No cervical adenopathy.  Skin:    Findings: No erythema or rash.     Nails: There is no clubbing.  Neurological:     Mental Status: She is alert.     Diagnostics: none   Assessment and Plan:   1. Idiopathic urticaria   2. Pruritic disorder     1.  Allergen avoidance measures - ginger  2.  Omalizumab injection today and every 4 weeks  3.  Use a combination of the following every day:   A.  Cetirizine 10 mg - 1 tablet twice a day  B.  Famotidine 20 mg - 1 tablet twice a day  4.  Return to clinic in 8 weeks or earlier if problem  [place] will use another injection of omalizumab today and we will see if she does develop a side effect from this exposure.  Her history of developing what appears to be some CNS side effects and coughing and a low-grade fever and some muscle aches sounds like she might have developed some type of viral syndrome although certainly this could be a hypersensitivity reaction directed against omalizumab.  If she does redevelop significant systemic and constitutional symptoms after using omalizumab we will give her low-dose of prednisone for a few weeks.  I like to continue to have her use cetirizine and famotidine.  I will see her back in this clinic in 8 weeks or earlier if there is a problem.  Allena Katz, MD [facility]

## 2022-03-31 ENCOUNTER — Encounter: Payer: Self-pay | Admitting: Allergy and Immunology

## 2022-04-02 ENCOUNTER — Other Ambulatory Visit: Payer: Self-pay | Admitting: Family Medicine

## 2022-04-02 NOTE — Telephone Encounter (Signed)
Duplicate request (see 27/06/23 refill note).

## 2022-04-03 ENCOUNTER — Other Ambulatory Visit: Payer: Self-pay | Admitting: Family Medicine

## 2022-04-03 DIAGNOSIS — I10 Essential (primary) hypertension: Secondary | ICD-10-CM

## 2022-04-09 ENCOUNTER — Ambulatory Visit: Payer: PPO | Admitting: Cardiology

## 2022-04-09 ENCOUNTER — Ambulatory Visit (INDEPENDENT_AMBULATORY_CARE_PROVIDER_SITE_OTHER): Payer: PPO

## 2022-04-09 DIAGNOSIS — L501 Idiopathic urticaria: Secondary | ICD-10-CM | POA: Diagnosis not present

## 2022-04-09 MED ORDER — EPINEPHRINE 0.3 MG/0.3ML IJ SOAJ: 0.3000 mg | INTRAMUSCULAR | 2 refills | Status: AC | PRN

## 2022-04-09 NOTE — Progress Notes (Signed)
Immunotherapy   Patient Details  Name: Brooke Beasley MRN: [id number] Date of Birth: 13-May-1930  04/09/2022 [name] came in for her Xolair injections today. This should be her 2nd time receiving injections. Prescription for epinephrine sent to pharmacy as it is requested by Xolair protocol.  Guy Franco 04/09/2022, 4:03 PM

## 2022-04-18 ENCOUNTER — Other Ambulatory Visit: Payer: Self-pay | Admitting: Family Medicine

## 2022-04-19 DIAGNOSIS — I5032 Chronic diastolic (congestive) heart failure: Secondary | ICD-10-CM | POA: Diagnosis not present

## 2022-04-20 ENCOUNTER — Encounter: Payer: Self-pay | Admitting: Family Medicine

## 2022-04-20 ENCOUNTER — Ambulatory Visit (INDEPENDENT_AMBULATORY_CARE_PROVIDER_SITE_OTHER): Payer: PPO | Admitting: Family Medicine

## 2022-04-20 VITALS — BP 116/68 | HR 88 | Temp 97.5°F | Ht 60.0 in | Wt 158.2 lb

## 2022-04-20 DIAGNOSIS — J020 Streptococcal pharyngitis: Secondary | ICD-10-CM

## 2022-04-20 DIAGNOSIS — R509 Fever, unspecified: Secondary | ICD-10-CM | POA: Diagnosis not present

## 2022-04-20 DIAGNOSIS — J101 Influenza due to other identified influenza virus with other respiratory manifestations: Secondary | ICD-10-CM

## 2022-04-20 DIAGNOSIS — L299 Pruritus, unspecified: Secondary | ICD-10-CM | POA: Diagnosis not present

## 2022-04-20 HISTORY — DX: Streptococcal pharyngitis: J02.0

## 2022-04-20 HISTORY — DX: Influenza due to other identified influenza virus with other respiratory manifestations: J10.1

## 2022-04-20 LAB — POCT RAPID STREP A (OFFICE): Rapid Strep A Screen: POSITIVE — AB

## 2022-04-20 LAB — POC COVID19 BINAXNOW: SARS Coronavirus 2 Ag: NEGATIVE

## 2022-04-20 LAB — POC INFLUENZA A&B (BINAX/QUICKVUE)
Influenza A, POC: POSITIVE — AB
Influenza B, POC: NEGATIVE

## 2022-04-20 MED ORDER — OSELTAMIVIR PHOSPHATE 75 MG PO CAPS: 75.0000 mg | ORAL_CAPSULE | Freq: Two times a day (BID) | ORAL | 0 refills | Status: DC

## 2022-04-20 MED ORDER — AMOXICILLIN 500 MG PO CAPS: 500.0000 mg | ORAL_CAPSULE | Freq: Two times a day (BID) | ORAL | 0 refills | Status: AC

## 2022-04-20 NOTE — Assessment & Plan Note (Signed)
Chronic pruritus - treating as chronic urticaria, allergist recently started xolair injections. Has received 2 injections so far. She also continues cetirizine and pepcid bid.

## 2022-04-20 NOTE — Patient Instructions (Addendum)
You tested positive for flu A. You also tested positive for strep throat.  You tested negative for COVID.  Take tamiflu sent to pharmacy.  Take amoxicillin '500mg'$  twice daily for 10 days.  Push fluids and rest. Let us know if not improving with treatment.   Influenza, Adult Influenza, also called "the flu," is a viral infection that mainly affects the respiratory tract. This includes the lungs, nose, and throat. The flu spreads easily from person to person (is contagious). It causes common cold symptoms, along with high fever and body aches. What are the causes? This condition is caused by the influenza virus. You can get the virus by: Breathing in droplets that are in the air from an infected person's cough or sneeze. Touching something that has the virus on it (has been contaminated) and then touching your mouth, nose, or eyes. What increases the risk? The following factors may make you more likely to get the flu: Not washing or sanitizing your hands often. Having close contact with many people during cold and flu season. Touching your mouth, eyes, or nose without first washing or sanitizing your hands. Not getting an annual flu shot. You may have a higher risk for the flu, including serious problems, such as a lung infection (pneumonia), if you: Are older than 65. Are pregnant. Have a weakened disease-fighting system (immune system). This includes people who have HIV or AIDS, are on chemotherapy, or are taking medicines that reduce (suppress) the immune system. Have a long-term (chronic) illness, such as heart disease, kidney disease, diabetes, or lung disease. Have a liver disorder. Are severely overweight (morbidly obese). Have anemia. Have asthma. What are the signs or symptoms? Symptoms of this condition usually begin suddenly and last 4-14 days. These may include: Fever and chills. Headaches, body aches, or muscle aches. Sore throat. Cough. Runny or stuffy (congested)  nose. Chest discomfort. Poor appetite. Weakness or fatigue. Dizziness. Nausea or vomiting. How is this diagnosed? This condition may be diagnosed based on: Your symptoms and medical history. A physical exam. Swabbing your nose or throat and testing the fluid for the influenza virus. How is this treated? If the flu is diagnosed early, you can be treated with antiviral medicine that is given by mouth (orally) or through an IV. This can help reduce how severe the illness is and how long it lasts. Taking care of yourself at home can help relieve symptoms. Your health care provider may recommend: Taking over-the-counter medicines. Drinking plenty of fluids. In many cases, the flu goes away on its own. If you have severe symptoms or complications, you may be treated in a hospital. Follow these instructions at home: Activity Rest as needed and get plenty of sleep. Stay home from work or school as told by your health care provider. Unless you are visiting your health care provider, avoid leaving home until your fever has been gone for 24 hours without taking medicine. Eating and drinking Take an oral rehydration solution (ORS). This is a drink that is sold at pharmacies and retail stores. Drink enough fluid to keep your urine pale yellow. Drink clear fluids in small amounts as you are able. Clear fluids include water, ice chips, fruit juice mixed with water, and low-calorie sports drinks. Eat bland, easy-to-digest foods in small amounts as you are able. These foods include bananas, applesauce, rice, lean meats, toast, and crackers. Avoid drinking fluids that contain a lot of sugar or caffeine, such as energy drinks, regular sports drinks, and soda. Avoid alcohol.  Avoid spicy or fatty foods. General instructions     Take over-the-counter and prescription medicines only as told by your health care provider. Use a cool mist humidifier to add humidity to the air in your home. This can make it  easier to breathe. When using a cool mist humidifier, clean it daily. Empty the water and replace it with clean water. Cover your mouth and nose when you cough or sneeze. Wash your hands with soap and water often and for at least 20 seconds, especially after you cough or sneeze. If soap and water are not available, use alcohol-based hand sanitizer. Keep all follow-up visits. This is important. How is this prevented?  Get an annual flu shot. This is usually available in late summer, fall, or winter. Ask your health care provider when you should get your flu shot. Avoid contact with people who are sick during cold and flu season. This is generally fall and winter. Contact a health care provider if: You develop new symptoms. You have: Chest pain. Diarrhea. A fever. Your cough gets worse. You produce more mucus. You feel nauseous or you vomit. Get help right away if you: Develop shortness of breath or have difficulty breathing. Have skin or nails that turn a bluish color. Have severe pain or stiffness in your neck. Develop a sudden headache or sudden pain in your face or ear. Cannot eat or drink without vomiting. These symptoms may represent a serious problem that is an emergency. Do not wait to see if the symptoms will go away. Get medical help right away. Call your local emergency services (911 in the [place] Do not drive yourself to the hospital. Summary Influenza, also called "the flu," is a viral infection that primarily affects your respiratory tract. Symptoms of the flu usually begin suddenly and last 4-14 days. Getting an annual flu shot is the best way to prevent getting the flu. Stay home from work or school as told by your health care provider. Unless you are visiting your health care provider, avoid leaving home until your fever has been gone for 24 hours without taking medicine. Keep all follow-up visits. This is important. This information is not intended to replace advice  given to you by your health care provider. Make sure you discuss any questions you have with your health care provider. Document Revised: 11/30/2019 Document Reviewed: 11/30/2019 Elsevier Patient Education  [place].

## 2022-04-20 NOTE — Telephone Encounter (Signed)
Name of Medication: Clonazepam Name of Pharmacy: [facility] or Written Date and Quantity: 03/29/22, #30 Last Office Visit and Type: 04/20/22, flu; strep Next Office Visit and Type: none Last Controlled Substance Agreement Date: 09/26/14 Last UDS: 09/26/14

## 2022-04-20 NOTE — Progress Notes (Signed)
Patient ID: Brooke Beasley, female    DOB: 08-21-1930, 88 y.o.   MRN: [id number]  This visit was conducted in person.  BP 116/68   Pulse 88   Temp (!) 97.5 F (36.4 C) (Temporal)   Ht 5' (1.524 m)   Wt 158 lb 3.2 oz (71.8 kg)   SpO2 97%   BMI 30.90 kg/m    CC: sick visit - converted from AMW Subjective:   HPI: Brooke Beasley is a 88 y.o. female presenting on 04/20/2022 for Fever (C/o fever- max 101.3, abd pain in umbilical region, diarrhea, cough, weakness, ST, L ear pain and runny nose. Sxs 04/18/22. Neg home test, 04/19/22.)   2d h/o fever Tmax 101.3, cough productive of yellow mucous, weakness, sore throat, periumbilical abdominal pain and loose stools diarrhea. She also has R earache and rhinorrhea. Some nausea/vomiting.  BP very low this morning to 70/50s, very fatigued.  Treating with tylenol.   No body aches, HA, vomiting.   Negative COVID test 04/19/2022.   Doristine Devoid grandson was recently sick as well as DIL Lobbyist.   Treating chronic pruritus attributed to hives with xolair through allergist. First shot was 04/09/2022.      Relevant past medical, surgical, family and social history reviewed and updated as indicated. Interim medical history since our last visit reviewed. Allergies and medications reviewed and updated. Outpatient Medications Prior to Visit  Medication Sig Dispense Refill   ALPHAGAN P 0.1 % SOLN Place 1 drop into both eyes daily.     apixaban (ELIQUIS) 5 MG TABS tablet Take 1 tablet (5 mg total) by mouth 2 (two) times daily. 60 tablet 6   Ascorbic Acid (VITAMIN C PO) Take by mouth.     bimatoprost (LUMIGAN) 0.01 % SOLN Place 1 drop into both eyes at bedtime.     carvedilol (COREG) 6.25 MG tablet TAKE 1 TABLET BY MOUTH TWICE DAILY WITH A MEAL 180 tablet 0   cetirizine (ZYRTEC) 10 MG tablet Take 1 tablet (10 mg total) by mouth daily. 30 tablet 5   cholecalciferol (VITAMIN D) 1000 units tablet Take 1,000 Units by mouth daily.      clobetasol cream (TEMOVATE) 4.33 % Apply 1 Application topically 2 (two) times daily. 30 g 0   clonazePAM (KLONOPIN) 0.5 MG tablet TAKE 1 TABLET BY MOUTH AT BEDTIME 30 tablet 0   colchicine 0.6 MG tablet Take 1 tablet (0.6 mg total) by mouth daily. 90 tablet 1   EPINEPHrine (EPIPEN 2-PAK) 0.3 mg/0.3 mL IJ SOAJ injection Inject 0.3 mg into the muscle as needed for anaphylaxis. Arvid Right INJECTION PROTOCOL 2 each 2   famotidine (PEPCID) 20 MG tablet Take 1 tablet (20 mg total) by mouth 2 (two) times daily. 60 tablet 5   Ferrous Sulfate (IRON) 28 MG TABS Take 1 tablet (28 mg total) by mouth every other day.     furosemide (LASIX) 20 MG tablet Take 1 tablet (20 mg total) by mouth daily. 30 tablet 6   gabapentin (NEURONTIN) 300 MG capsule TAKE 1 CAPSULE BY MOUTH ONCE DAILY AS NEEDED FOR  NERVE  PAIN  (ITCHING) 30 capsule 0   hydrALAZINE (APRESOLINE) 25 MG tablet Take 1 tablet (25 mg total) by mouth in the morning and at bedtime. 180 tablet 1   levothyroxine (SYNTHROID) 100 MCG tablet Take 1 tablet (100 mcg total) by mouth daily before breakfast. 90 tablet 1   loperamide (IMODIUM) 2 MG capsule Take 1 capsule (2 mg total) by mouth 3 (three)  times daily as needed for diarrhea or loose stools. 30 capsule 0   Multiple Vitamin (MULTIVITAMIN ADULT PO) Take by mouth daily.     polyethylene glycol (MIRALAX / GLYCOLAX) 17 g packet Take 17 g by mouth daily.     Potassium 99 MG TABS Take 1 tablet (99 mg total) by mouth every Monday, Wednesday, and Friday. 330 tablet    Probiotic Product (PROBIOTIC DAILY PO) Take by mouth.     Simethicone 180 MG CAPS Take 1 capsule by mouth daily as needed (indigestion).     spironolactone (ALDACTONE) 25 MG tablet Take 1 tablet by mouth daily 90 tablet 2   Facility-Administered Medications Prior to Visit  Medication Dose Route Frequency Provider Last Rate Last Admin   omalizumab Arvid Right) injection 300 mg  300 mg Subcutaneous Q28 days Kozlow, Donnamarie Poag, MD   300 mg at 04/09/22 1602      Per HPI unless specifically indicated in ROS section below Review of Systems  Objective:  BP 116/68   Pulse 88   Temp (!) 97.5 F (36.4 C) (Temporal)   Ht 5' (1.524 m)   Wt 158 lb 3.2 oz (71.8 kg)   SpO2 97%   BMI 30.90 kg/m   Wt Readings from Last 3 Encounters:  04/20/22 158 lb 3.2 oz (71.8 kg)  03/30/22 161 lb 1.6 oz (73.1 kg)  03/29/22 161 lb 4 oz (73.1 kg)      Physical Exam Vitals and nursing note reviewed.  Constitutional:      Appearance: Normal appearance. She is not ill-appearing.     Comments: Tired appearing, ambulates with cane, needs assistance to get on exam table  HENT:     Head: Normocephalic and atraumatic.     Right Ear: Tympanic membrane, ear canal and external ear normal. There is no impacted cerumen.     Left Ear: Tympanic membrane, ear canal and external ear normal. There is no impacted cerumen.     Nose: Congestion and rhinorrhea present.     Right Sinus: No maxillary sinus tenderness or frontal sinus tenderness.     Left Sinus: No maxillary sinus tenderness or frontal sinus tenderness.     Mouth/Throat:     Mouth: Mucous membranes are moist.     Pharynx: Oropharynx is clear. Posterior oropharyngeal erythema present. No oropharyngeal exudate.     Tonsils: No tonsillar exudate.  Eyes:     Extraocular Movements: Extraocular movements intact.     Conjunctiva/sclera: Conjunctivae normal.     Pupils: Pupils are equal, round, and reactive to light.  Cardiovascular:     Rate and Rhythm: Normal rate and regular rhythm.     Pulses: Normal pulses.     Heart sounds: Murmur (3/6 systolic sternal border with radiation to axilla) heard.  Pulmonary:     Effort: Pulmonary effort is normal. No respiratory distress.     Breath sounds: Normal breath sounds. No wheezing, rhonchi or rales.  Lymphadenopathy:     Head:     Right side of head: Tonsillar adenopathy present. No submental, submandibular, preauricular or posterior auricular adenopathy.     Left side of  head: No submental, submandibular, tonsillar, preauricular or posterior auricular adenopathy.     Cervical: No cervical adenopathy.     Right cervical: No superficial or deep cervical adenopathy.    Left cervical: No superficial or deep cervical adenopathy.     Upper Body:     Right upper body: No supraclavicular adenopathy.     Left upper  body: No supraclavicular adenopathy.  Skin:    Findings: No rash.  Neurological:     Mental Status: She is alert.  Psychiatric:        Mood and Affect: Mood normal.        Behavior: Behavior normal.       Results for orders placed or performed in visit on 04/20/22  POC COVID-19 BinaxNow  Result Value Ref Range   SARS Coronavirus 2 Ag Negative Negative  POCT rapid strep A  Result Value Ref Range   Rapid Strep A Screen Positive (A) Negative  POC Influenza A&B(BINAX/QUICKVUE)  Result Value Ref Range   Influenza A, POC Positive (A) Negative   Influenza B, POC Negative Negative    Assessment & Plan:   Problem List Items Addressed This Visit     Pruritus    Chronic pruritus - treating as chronic urticaria, allergist recently started xolair injections. Has received 2 injections so far. She also continues cetirizine and pepcid bid.       Influenza A - Primary    Sick great-grandson at home with viral process. Anticipate same.  Given age and comorbidities and recent fever, rpt COVID swab (in case initial was false negative), as well as flu and strep swabs.  Supportive measures reviewed. Red flags to seek further care reviewed. Anticipated course of improvement reviewed.   Tested positive for influenza A - will Rx tamiflu. Rest of instructions as per above.       Relevant Medications   oseltamivir (TAMIFLU) 75 MG capsule   Strep pharyngitis    RST positive for strep pharyngitis.  We will also treat with amoxicillin course.  See below for influenza treatment.       Relevant Medications   oseltamivir (TAMIFLU) 75 MG capsule   Other  Visit Diagnoses     Fever, unspecified fever cause       Relevant Orders   POC COVID-19 BinaxNow (Completed)   POCT rapid strep A (Completed)   POC Influenza A&B(BINAX/QUICKVUE) (Completed)        Meds ordered this encounter  Medications   DISCONTD: oseltamivir (TAMIFLU) 75 MG capsule    Sig: Take 1 capsule (75 mg total) by mouth 2 (two) times daily.    Dispense:  10 capsule    Refill:  0   oseltamivir (TAMIFLU) 75 MG capsule    Sig: Take 1 capsule (75 mg total) by mouth 2 (two) times daily.    Dispense:  10 capsule    Refill:  0   amoxicillin (AMOXIL) 500 MG capsule    Sig: Take 1 capsule (500 mg total) by mouth 2 (two) times daily for 10 days.    Dispense:  20 capsule    Refill:  0   Orders Placed This Encounter  Procedures   POC COVID-19 BinaxNow    Order Specific Question:   Previously tested for COVID-19    Answer:   Yes    Order Specific Question:   Resident in a congregate (group) care setting    Answer:   No    Order Specific Question:   Employed in healthcare setting    Answer:   No    Order Specific Question:   Pregnant    Answer:   No   POCT rapid strep A   POC Influenza A&B(BINAX/QUICKVUE)    Patient instructions: You tested positive for flu A. You also tested positive for strep throat.  You tested negative for COVID.  Take tamiflu sent to  pharmacy.  Take amoxicillin '500mg'$  twice daily for 10 days.  Push fluids and rest. Let us know if not improving with treatment.   Follow up plan: Return if symptoms worsen or fail to improve.  [name], MD

## 2022-04-20 NOTE — Assessment & Plan Note (Addendum)
RST positive for strep pharyngitis.  We will also treat with amoxicillin course.  See below for influenza treatment.

## 2022-04-20 NOTE — Assessment & Plan Note (Addendum)
Sick great-grandson at home with viral process. Anticipate same.  Given age and comorbidities and recent fever, rpt COVID swab (in case initial was false negative), as well as flu and strep swabs.  Supportive measures reviewed. Red flags to seek further care reviewed. Anticipated course of improvement reviewed.   Tested positive for influenza A - will Rx tamiflu. Rest of instructions as per above.

## 2022-04-21 NOTE — Telephone Encounter (Signed)
ERx 

## 2022-04-22 ENCOUNTER — Telehealth: Payer: Self-pay | Admitting: Allergy and Immunology

## 2022-04-22 MED ORDER — PREDNISONE 10 MG PO TABS: 10.0000 mg | ORAL_TABLET | Freq: Every day | ORAL | 0 refills | Status: AC

## 2022-04-22 NOTE — Telephone Encounter (Signed)
Patient informed and prescription sent to pharmacy

## 2022-04-22 NOTE — Telephone Encounter (Signed)
Patient tested positive for the Flu on Tuesday and was given Tamiflu. She also tested positive for strep and is now taking Amoxicillin. She is on her 2nd dose of Xolair and her itching is still the same. She feels like the itching got worse when she started the Tamiflu. She is itching all over her body and is wondering if there is anything we can send in to help with this.   [facility] on [address] in [place]

## 2022-04-22 NOTE — Telephone Encounter (Signed)
Please advice . Thank you

## 2022-04-29 ENCOUNTER — Other Ambulatory Visit: Payer: Self-pay | Admitting: *Deleted

## 2022-05-01 ENCOUNTER — Other Ambulatory Visit: Payer: Self-pay | Admitting: Family Medicine

## 2022-05-02 ENCOUNTER — Encounter: Payer: Self-pay | Admitting: Allergy and Immunology

## 2022-05-03 NOTE — Telephone Encounter (Signed)
Gabapentin Last filled: 03/29/22, #30 Last OV: 04/20/22, fever Next OV: 01/01/22, 3 mo f/u

## 2022-05-04 NOTE — Telephone Encounter (Signed)
ERx 

## 2022-05-05 ENCOUNTER — Ambulatory Visit (INDEPENDENT_AMBULATORY_CARE_PROVIDER_SITE_OTHER): Payer: PPO | Admitting: Allergy and Immunology

## 2022-05-05 VITALS — BP 148/82 | HR 60 | Resp 16

## 2022-05-05 DIAGNOSIS — L501 Idiopathic urticaria: Secondary | ICD-10-CM

## 2022-05-05 DIAGNOSIS — L989 Disorder of the skin and subcutaneous tissue, unspecified: Secondary | ICD-10-CM | POA: Diagnosis not present

## 2022-05-05 DIAGNOSIS — L299 Pruritus, unspecified: Secondary | ICD-10-CM | POA: Diagnosis not present

## 2022-05-05 MED ORDER — MOMETASONE FUROATE 0.1 % EX OINT: TOPICAL_OINTMENT | CUTANEOUS | 5 refills | Status: DC

## 2022-05-05 NOTE — Patient Instructions (Addendum)
  1.  Omalizumab injection every 4 weeks  2.  Use a combination of the following every day:   A.  Cetirizine 10 mg - 1 tablet twice a day  B.  Famotidine 20 mg - 1 tablet twice a day  C. Shower, then mometasone 0.1% ointment applied while wet daily  3.  Return to clinic in 12 weeks or earlier if problem

## 2022-05-05 NOTE — Progress Notes (Unsigned)
[facility]   Follow-up Note  Referring Provider: [name], MD Primary Provider: [name], MD Date of Office Visit: 05/05/2022  Subjective:   Brooke Beasley (DOB: 1930/08/07) is a 89 y.o. female who returns to the [facility] on 05/05/2022 in re-evaluation of the following:  HPI: Brooke Beasley turns to this clinic in evaluation of urticaria and pruritic disorder.  I last saw her in this clinic 30 March 2022.  She was doing okay with her skin condition until she contracted influenza and was given Tamiflu.  The next day after completing this medicine she developed very bad pruritus and lots of red inflamed skin and patches across her body.  This has been in existence now for the past week.  Allergies as of 05/05/2022       Reactions   Amlodipine Other (See Comments)   Stomach pains--"feel like my insides are on fire"   Ace Inhibitors Cough   Carvedilol Other (See Comments)   fatigue   Chlorthalidone Other (See Comments)   Severe hyponatremia   Losartan Other (See Comments)   Cr bumped   Montelukast Other (See Comments)   Slurred speech; really drowsy   Other Other (See Comments)   No seeds or corn because of IBS   Paroxetine Hcl Other (See Comments)   Not effective - felt ill on this medicine   Risedronate Sodium    REACTION: joint pain   Simvastatin    REACTION: the full 20 mg pill causes leg pain- can tol 10 mg   Toprol Xl [metoprolol] Diarrhea   Diarrhea and weakness   Alendronate Sodium Palpitations   Influenza Vaccines Rash, Other (See Comments)   Led to mental breakdown in 1960s   Silenor [doxepin Hcl] Palpitations   Hallucinations and racing heart   Sulfonamide Derivatives Rash        Medication List    Alphagan P 0.1 % Soln Generic drug: brimonidine Place 1 drop into both eyes daily.   apixaban 5 MG Tabs tablet Commonly known as: ELIQUIS Take 1 tablet (5 mg total) by  mouth 2 (two) times daily.   bimatoprost 0.01 % Soln Commonly known as: LUMIGAN Place 1 drop into both eyes at bedtime.   carvedilol 6.25 MG tablet Commonly known as: COREG TAKE 1 TABLET BY MOUTH TWICE DAILY WITH A MEAL   cetirizine 10 MG tablet Commonly known as: ZYRTEC Take 1 tablet (10 mg total) by mouth daily.   cholecalciferol 1000 units tablet Commonly known as: VITAMIN D Take 1,000 Units by mouth daily.   clobetasol cream 0.05 % Commonly known as: TEMOVATE Apply 1 Application topically 2 (two) times daily.   clonazePAM 0.5 MG tablet Commonly known as: KLONOPIN TAKE 1 TABLET BY MOUTH AT BEDTIME   colchicine 0.6 MG tablet Take 1 tablet (0.6 mg total) by mouth daily.   EPINEPHrine 0.3 mg/0.3 mL Soaj injection Commonly known as: EpiPen 2-Pak Inject 0.3 mg into the muscle as needed for anaphylaxis. Arvid Right INJECTION PROTOCOL   famotidine 20 MG tablet Commonly known as: PEPCID Take 1 tablet (20 mg total) by mouth 2 (two) times daily.   furosemide 20 MG tablet Commonly known as: LASIX Take 1 tablet (20 mg total) by mouth daily.   gabapentin 300 MG capsule Commonly known as: NEURONTIN TAKE 1 CAPSULE BY MOUTH ONCE DAILY AS NEEDED FOR  NERVE  PAIN  (ITCHING)   hydrALAZINE 25 MG tablet Commonly known as: APRESOLINE Take  1 tablet (25 mg total) by mouth in the morning and at bedtime.   Iron 28 MG Tabs Take 1 tablet (28 mg total) by mouth every other day.   levothyroxine 100 MCG tablet Commonly known as: Synthroid Take 1 tablet (100 mcg total) by mouth daily before breakfast.   loperamide 2 MG capsule Commonly known as: IMODIUM Take 1 capsule (2 mg total) by mouth 3 (three) times daily as needed for diarrhea or loose stools.   mometasone 0.1 % ointment Commonly known as: ELOCON Shower than ointment applied while still wet daily   MULTIVITAMIN ADULT PO Take by mouth daily.   oseltamivir 75 MG capsule Commonly known as: Tamiflu Take 1 capsule (75 mg total)  by mouth 2 (two) times daily.   polyethylene glycol 17 g packet Commonly known as: MIRALAX / GLYCOLAX Take 17 g by mouth daily.   Potassium 99 MG Tabs Take 1 tablet (99 mg total) by mouth every Monday, Wednesday, and Friday.   PROBIOTIC DAILY PO Take by mouth.   Simethicone 180 MG Caps Take 1 capsule by mouth daily as needed (indigestion).   spironolactone 25 MG tablet Commonly known as: ALDACTONE Take 1 tablet by mouth daily   VITAMIN C PO Take by mouth.    Past Medical History:  Diagnosis Date   Anxiety    C. difficile diarrhea 07/11/2018   S/p multiple oral vanc treatments (course and tapers) and completed Zinplava monoclonal Ab treatment (05/10/2019) Fecal transplant on hold during COVID pandemic   CAP (community acquired pneumonia) 12/22/2017   Carotid stenosis 10/18/2014   R 1-39%, L 40-59%, rpt 1 yr (09/2014)    CKD (chronic kidney disease) stage 3, GFR 30-59 ml/min (HCC) 08/02/2014   Closed fracture of right orbit ([place]) 02/10/2021   Clostridioides difficile infection    hx 2020   COVID-19 virus infection 10/13/2020   Degenerative disc disease    LS   Depression    nervous breakdonw in 1964-out of work for a year   Diverticulosis    severe by colonoscopy   Family history of adverse reaction to anesthesia    n/v   GERD (gastroesophageal reflux disease)    Glaucoma    Heart murmur 08/2011   mitral regurge - on echo    History of hiatal hernia    History of shingles    HTN (hypertension)    Hyperlipidemia    Hypertension    Hypothyroidism    IBS 11/02/2006   Intestinal bacterial overgrowth    In small colon   Left shoulder pain 11/04/2014   Lower GI bleed 12/2020   thought diverticular complicated by ABLA with syncope and orbital fracture s/p hospitalization   Maxillary fracture ([place]) 04/08/2012   Osteoarthritis 2016   Jefm [facility])   Osteoporosis    dexa 2011   Perirectal abscess 08/03/2021   CT guided aspiration of abscess grow Proteums mirabilis and  Citrobacter koseri 07/2021, currently receiving IV Zosyn planned total 6 wks.   Pseudogout 2016   shoulders ([place])    Past Surgical History:  Procedure Laterality Date   barium enema  2012   severe diverticulosis, redundant colon   Bowel obstruction  1999   no surgery in hosp x 3 days   BUBBLE STUDY  07/27/2021   Procedure: BUBBLE STUDY;  Surgeon: Dixie Dials, MD;  Location: [facility];  Service: Cardiovascular;;   CARDIOVERSION N/A 11/19/2021   Procedure: CARDIOVERSION;  Surgeon: Kate Sable, MD;  Location: [facility];  Service: Cardiovascular;  Laterality:  N/A;   COLONOSCOPY  1. 1999  2. 11/04   1. Not finished  2. Slight hemorrhage rectosigmoid area, severe sig diverticulosis   COLONOSCOPY WITH PROPOFOL N/A 09/10/2019   SSP with dysplasia, TA, rpt 3 yrs Marius Ditch, Tally Due, MD)   Dexa  1. [phone]   2. 9/04  3. 3/08    1. OP  2. OP, borderline, spine -2.44T  3. decreased BMD-OP   DG KNEE 1-2 VIEWS BILAT     LS x-ray with degenerative disc and facet change   ESOPHAGOGASTRODUODENOSCOPY     Negative   FLEXIBLE SIGMOIDOSCOPY N/A 01/16/2021   Procedure: FLEXIBLE SIGMOIDOSCOPY;  Surgeon: Gatha Mayer, MD;  Location: [facility];  Service: Endoscopy;  Laterality: N/A;  or unsedated   Hemorrhoid procedure  07/2006   PICC LINE REMOVAL ([place])  08/28/2021   PROCTOSCOPY N/A 05/26/2021   Procedure: RIGID PROCTOSCOPY;  Surgeon: Michael Boston, MD;  Location: [facility];  Service: General;  Laterality: N/A;   RECTOPEXY N/A 05/26/2021   Procedure: RECTOPEXY;  Surgeon: Michael Boston, MD;  Location: [facility];  Service: General;  Laterality: N/A;   TEE WITHOUT CARDIOVERSION N/A 07/27/2021   Procedure: TRANSESOPHAGEAL ECHOCARDIOGRAM (TEE);  Surgeon: Dixie Dials, MD;  Location[facility] ENDOSCOPY;  Service: Cardiovascular;  Laterality: N/A;   TONSILLECTOMY     TOTAL SHOULDER ARTHROPLASTY Left 11/27/2015   Corky Mull, MD   TOTAL SHOULDER REVISION Left 03/16/2016   Procedure: TOTAL  SHOULDER REVISION;  Surgeon: Corky Mull, MD;  Location: [facility];  Service: Orthopedics;  Laterality: Left;   US ECHOCARDIOGRAPHY  [id number]/2778   Normal systolic fxn with EF 24-23%.  Focal basal septal hypertrophy.  Mild diastolic dysfunction.  Mild MR.   XI ROBOTIC ASSISTED LOWER ANTERIOR RESECTION N/A 05/26/2021   Procedure: ROBOTIC LOW ANTERIOR RECTOSIGMOID RESECTION; ASSESSMENT OF TISSUE PERFUSION WITH FIREFLY;  Surgeon: Michael Boston, MD;  Location: [facility];  Service: General;  Laterality: N/A;    Review of systems negative except as noted in HPI / PMHx or noted below:  Review of Systems  Constitutional: Negative.   HENT: Negative.    Eyes: Negative.   Respiratory: Negative.    Cardiovascular: Negative.   Gastrointestinal: Negative.   Genitourinary: Negative.   Musculoskeletal: Negative.   Skin: Negative.   Neurological: Negative.   Endo/Heme/Allergies: Negative.   Psychiatric/Behavioral: Negative.       Objective:   Vitals:   05/05/22 1354  BP: (!) 148/82  Pulse: 60  Resp: 16  SpO2: 95%          Physical Exam Skin:    Findings: Rash (Erythematous papular dermatitis with evidence of excoriation) present.     Diagnostics: none  Assessment and Plan:   1. Idiopathic urticaria   2. Pruritic disorder   3. Inflammatory dermatosis    1.  Omalizumab injection every 4 weeks  2.  Use a combination of the following every day:   A.  Cetirizine 10 mg - 1 tablet twice a day  B.  Famotidine 20 mg - 1 tablet twice a day  C. Shower, then mometasone 0.1% ointment applied while wet daily  3.  Return to clinic in 12 weeks or earlier if problem  I suspect that Jennell's immune activation from her recent influenza infection has probably made her skin condition worse and were going to treat her with a topical anti-inflammatory agent as noted above while she continues on all of her previous medical plan.  And we will see what  happens over the course of the next several  weeks.   Allena Katz, MD [facility] / [facility] [place]

## 2022-05-06 ENCOUNTER — Telehealth: Payer: Self-pay

## 2022-05-06 ENCOUNTER — Encounter: Payer: Self-pay | Admitting: Allergy and Immunology

## 2022-05-06 NOTE — Telephone Encounter (Signed)
Sent MyChart message to patient

## 2022-05-06 NOTE — Telephone Encounter (Signed)
-----   Message from Jiles Prows, MD sent at 05/06/2022  6:53 AM EST ----- Please ask Brooke Beasley if she is ever seen a dermatologist and had a biopsy of her skin.  We may be able to give her a better form of injection to treat her skin condition if there is biopsy-proven changes consistent with prurigo nodularis.

## 2022-05-07 ENCOUNTER — Ambulatory Visit: Payer: PPO

## 2022-05-19 ENCOUNTER — Telehealth: Payer: Self-pay

## 2022-05-19 DIAGNOSIS — E039 Hypothyroidism, unspecified: Secondary | ICD-10-CM

## 2022-05-19 MED ORDER — LEVOTHYROXINE SODIUM 100 MCG PO TABS: 100.0000 ug | ORAL_TABLET | Freq: Every day | ORAL | 0 refills | Status: DC

## 2022-05-19 NOTE — Telephone Encounter (Signed)
E-scribed refill.  Plz schedule CPE and lab visits to prevent delays in future refills.  

## 2022-05-20 DIAGNOSIS — I5032 Chronic diastolic (congestive) heart failure: Secondary | ICD-10-CM | POA: Diagnosis not present

## 2022-05-20 NOTE — Telephone Encounter (Signed)
Spoke to pt, scheduled cpe for 08/13/22 

## 2022-05-22 ENCOUNTER — Other Ambulatory Visit: Payer: Self-pay | Admitting: Family Medicine

## 2022-05-24 NOTE — Telephone Encounter (Signed)
Name of Medication: Clonazepam Name of Pharmacy: [facility] or Written Date and Quantity: 03/29/22, #30 Last Office Visit and Type: 04/20/22, flu; strep Next Office Visit and Type: 08/13/22, CPE Last Controlled Substance Agreement Date: 09/26/14 Last UDS: 09/26/14

## 2022-05-25 ENCOUNTER — Ambulatory Visit: Payer: PPO | Admitting: Allergy and Immunology

## 2022-05-25 VITALS — BP 138/84 | HR 70 | Temp 98.3°F | Resp 16 | Ht 60.0 in | Wt 161.6 lb

## 2022-05-25 DIAGNOSIS — L501 Idiopathic urticaria: Secondary | ICD-10-CM

## 2022-05-25 DIAGNOSIS — L989 Disorder of the skin and subcutaneous tissue, unspecified: Secondary | ICD-10-CM | POA: Diagnosis not present

## 2022-05-25 DIAGNOSIS — L299 Pruritus, unspecified: Secondary | ICD-10-CM

## 2022-05-25 DIAGNOSIS — J310 Chronic rhinitis: Secondary | ICD-10-CM | POA: Diagnosis not present

## 2022-05-25 MED ORDER — CETIRIZINE HCL 10 MG PO TABS: 10.0000 mg | ORAL_TABLET | Freq: Every day | ORAL | 5 refills | Status: DC | PRN

## 2022-05-25 MED ORDER — IPRATROPIUM BROMIDE 0.06 % NA SOLN: 2.0000 | Freq: Four times a day (QID) | NASAL | 5 refills | Status: DC

## 2022-05-25 MED ORDER — MOMETASONE FUROATE 0.1 % EX OINT: TOPICAL_OINTMENT | CUTANEOUS | 5 refills | Status: DC

## 2022-05-25 MED ORDER — FAMOTIDINE 20 MG PO TABS: 20.0000 mg | ORAL_TABLET | Freq: Two times a day (BID) | ORAL | 5 refills | Status: DC

## 2022-05-25 NOTE — Telephone Encounter (Signed)
ERx 

## 2022-05-25 NOTE — Patient Instructions (Signed)
  1.  DECREASE Omalizumab injection to 150 mg every 4 weeks  2.  Use a combination of the following every day:   A.  Cetirizine 10 mg - 1 tablet twice a day  B.  Famotidine 20 mg - 1 tablet twice a day  C. Shower, then mometasone 0.1% ointment applied while wet 1-7 times a week  3. Can use nasal ipratropium 0.06% - 2 sprays each nostril every 6 hours to dry nose  4. Obtain eye exam for runny eyes  5.  Return to clinic in 12 weeks or earlier if problem

## 2022-05-25 NOTE — Progress Notes (Unsigned)
Brooke Beasley   Follow-up Note  Referring Provider: [name], MD Primary Provider: [name], MD Date of Office Visit: 05/25/2022  Subjective:   Brooke Beasley (DOB: [birth year]) is a 89 y.o. female who returns to the [facility] on 05/25/2022 in re-evaluation of the following:  HPI: Brooke Beasley returns to this clinic in evaluation of her combination urticaria/inflammatory ptosis and pruritic disorder.  I last saw her in this clinic 05 May 2022.  She is much better at this point in time and for the past 3 to 4 days she has had very little itchiness.  It has been about 3 days without any topical mometasone.  She informs me that when she gets injected with omalizumab for about 5 days or so she develops lots of redness around her eyes and she just feels tired.  She is also been having some runny eyes especially on the right.  She has an appointment to see her eye doctor on Friday.  She has a runny nose which has been a persistent issue for a while now.  She does not really have any other nasal symptoms.  Allergies as of 05/25/2022       Reactions   Amlodipine Other (See Comments)   Stomach pains--"feel like my insides are on fire"   Ace Inhibitors Cough   Carvedilol Other (See Comments)   fatigue   Chlorthalidone Other (See Comments)   Severe hyponatremia   Losartan Other (See Comments)   Cr bumped   Montelukast Other (See Comments)   Slurred speech; really drowsy   Other Other (See Comments)   No seeds or corn because of IBS   Paroxetine Hcl Other (See Comments)   Not effective - felt ill on this medicine   Risedronate Sodium    REACTION: joint pain   Simvastatin    REACTION: the full 20 mg pill causes leg pain- can tol 10 mg   Toprol Xl [metoprolol] Diarrhea   Diarrhea and weakness   Alendronate Sodium Palpitations   Influenza Vaccines Rash, Other (See Comments)   Led to mental breakdown  in 1960s   Silenor [doxepin Hcl] Palpitations   Hallucinations and racing heart   Sulfonamide Derivatives Rash        Medication List    Alphagan P 0.1 % Soln Generic drug: brimonidine Place 1 drop into both eyes daily.   apixaban 5 MG Tabs tablet Commonly known as: ELIQUIS Take 1 tablet (5 mg total) by mouth 2 (two) times daily.   bimatoprost 0.01 % Soln Commonly known as: LUMIGAN Place 1 drop into both eyes at bedtime.   carvedilol 6.25 MG tablet Commonly known as: COREG TAKE 1 TABLET BY MOUTH TWICE DAILY WITH A MEAL   cetirizine 10 MG tablet Commonly known as: ZYRTEC Take 1 tablet (10 mg total) by mouth daily as needed for allergies (Can take an extra dose during flare ups.).   cholecalciferol 1000 units tablet Commonly known as: VITAMIN D Take 1,000 Units by mouth daily.   clobetasol cream 0.05 % Commonly known as: TEMOVATE Apply 1 Application topically 2 (two) times daily.   clonazePAM 0.5 MG tablet Commonly known as: KLONOPIN TAKE 1 TABLET BY MOUTH AT BEDTIME   colchicine 0.6 MG tablet Take 1 tablet (0.6 mg total) by mouth daily.   EPINEPHrine 0.3 mg/0.3 mL Soaj injection Commonly known as: EpiPen 2-Pak Inject 0.3 mg into the muscle as needed for anaphylaxis. Arvid Right INJECTION  PROTOCOL   famotidine 20 MG tablet Commonly known as: PEPCID Take 1 tablet (20 mg total) by mouth 2 (two) times daily.   furosemide 20 MG tablet Commonly known as: LASIX Take 1 tablet (20 mg total) by mouth daily.   gabapentin 300 MG capsule Commonly known as: NEURONTIN TAKE 1 CAPSULE BY MOUTH ONCE DAILY AS NEEDED FOR  NERVE  PAIN  (ITCHING)   hydrALAZINE 25 MG tablet Commonly known as: APRESOLINE Take 1 tablet (25 mg total) by mouth in the morning and at bedtime.   ipratropium 0.06 % nasal spray Commonly known as: ATROVENT Place 2 sprays into both nostrils 4 (four) times daily.   Iron 28 MG Tabs Take 1 tablet (28 mg total) by mouth every other day.   levothyroxine  100 MCG tablet Commonly known as: Synthroid Take 1 tablet (100 mcg total) by mouth daily before breakfast.   loperamide 2 MG capsule Commonly known as: IMODIUM Take 1 capsule (2 mg total) by mouth 3 (three) times daily as needed for diarrhea or loose stools.   mometasone 0.1 % ointment Commonly known as: ELOCON Shower than ointment applied while still wet daily   MULTIVITAMIN ADULT PO Take by mouth daily.   polyethylene glycol 17 g packet Commonly known as: MIRALAX / GLYCOLAX Take 17 g by mouth daily.   Potassium 99 MG Tabs Take 1 tablet (99 mg total) by mouth every Monday, Wednesday, and Friday.   Simethicone 180 MG Caps Take 1 capsule by mouth daily as needed (indigestion).   VITAMIN C PO Take by mouth.    Past Medical History:  Diagnosis Date   Anxiety    C. difficile diarrhea 07/11/2018   S/p multiple oral vanc treatments (course and tapers) and completed Zinplava monoclonal Ab treatment (05/10/2019) Fecal transplant on hold during COVID pandemic   CAP (community acquired pneumonia) 12/22/2017   Carotid stenosis 10/18/2014   R 1-39%, L 40-59%, rpt 1 yr (09/2014)    CKD (chronic kidney disease) stage 3, GFR 30-59 ml/min (HCC) 08/02/2014   Closed fracture of right orbit ([place]) 02/10/2021   Clostridioides difficile infection    hx 2020   COVID-19 virus infection 10/13/2020   Degenerative disc disease    LS   Depression    nervous breakdonw in 1964-out of work for a year   Diverticulosis    severe by colonoscopy   Family history of adverse reaction to anesthesia    n/v   GERD (gastroesophageal reflux disease)    Glaucoma    Heart murmur 08/2011   mitral regurge - on echo    History of hiatal hernia    History of shingles    HTN (hypertension)    Hyperlipidemia    Hypertension    Hypothyroidism    IBS 11/02/2006   Intestinal bacterial overgrowth    In small colon   Left shoulder pain 11/04/2014   Lower GI bleed 12/2020   thought diverticular complicated by  ABLA with syncope and orbital fracture s/p hospitalization   Maxillary fracture ([place]) 04/08/2012   Osteoarthritis 2016   Jefm Bryant)   Osteoporosis    dexa 2011   Perirectal abscess 08/03/2021   CT guided aspiration of abscess grow Proteums mirabilis and Citrobacter koseri 07/2021, currently receiving IV Zosyn planned total 6 wks.   Pseudogout 2016   shoulders ([place])    Past Surgical History:  Procedure Laterality Date   barium enema  2012   severe diverticulosis, redundant colon   Bowel obstruction  1999  no surgery in hosp x 3 days   BUBBLE STUDY  07/27/2021   Procedure: BUBBLE STUDY;  Surgeon: Dixie Dials, MD;  Location: [facility];  Service: Cardiovascular;;   CARDIOVERSION N/A 11/19/2021   Procedure: CARDIOVERSION;  Surgeon: Kate Sable, MD;  Location: [facility];  Service: Cardiovascular;  Laterality: N/A;   COLONOSCOPY  1. 1999  2. 11/04   1. Not finished  2. Slight hemorrhage rectosigmoid area, severe sig diverticulosis   COLONOSCOPY WITH PROPOFOL N/A 09/10/2019   SSP with dysplasia, TA, rpt 3 yrs Marius Ditch, Tally Due, MD)   Dexa  1. [id number]   2. 9/04  3. 3/08    1. OP  2. OP, borderline, spine -2.44T  3. decreased BMD-OP   DG KNEE 1-2 VIEWS BILAT     LS x-ray with degenerative disc and facet change   ESOPHAGOGASTRODUODENOSCOPY     Negative   FLEXIBLE SIGMOIDOSCOPY N/A 01/16/2021   Procedure: FLEXIBLE SIGMOIDOSCOPY;  Surgeon: Gatha Mayer, MD;  Location: [facility];  Service: Endoscopy;  Laterality: N/A;  or unsedated   Hemorrhoid procedure  07/2006   PICC LINE REMOVAL ([place] HX)  08/28/2021   PROCTOSCOPY N/A 05/26/2021   Procedure: RIGID PROCTOSCOPY;  Surgeon: Michael Boston, MD;  Location: [facility];  Service: General;  Laterality: N/A;   RECTOPEXY N/A 05/26/2021   Procedure: RECTOPEXY;  Surgeon: Michael Boston, MD;  Location: [facility];  Service: General;  Laterality: N/A;   TEE WITHOUT CARDIOVERSION N/A 07/27/2021   Procedure: TRANSESOPHAGEAL ECHOCARDIOGRAM  (TEE);  Surgeon: Dixie Dials, MD;  Location: [facility];  Service: Cardiovascular;  Laterality: N/A;   TONSILLECTOMY     TOTAL SHOULDER ARTHROPLASTY Left 11/27/2015   Corky Mull, MD   TOTAL SHOULDER REVISION Left 03/16/2016   Procedure: TOTAL SHOULDER REVISION;  Surgeon: Corky Mull, MD;  Location: [facility];  Service: Orthopedics;  Laterality: Left;   US ECHOCARDIOGRAPHY  38/2505   Normal systolic fxn with EF 39-76%.  Focal basal septal hypertrophy.  Mild diastolic dysfunction.  Mild MR.   XI ROBOTIC ASSISTED LOWER ANTERIOR RESECTION N/A 05/26/2021   Procedure: ROBOTIC LOW ANTERIOR RECTOSIGMOID RESECTION; ASSESSMENT OF TISSUE PERFUSION WITH FIREFLY;  Surgeon: Michael Boston, MD;  Location: [facility];  Service: General;  Laterality: N/A;    Review of systems negative except as noted in HPI / PMHx or noted below:  Review of Systems  Constitutional: Negative.   HENT: Negative.    Eyes: Negative.   Respiratory: Negative.    Cardiovascular: Negative.   Gastrointestinal: Negative.   Genitourinary: Negative.   Musculoskeletal: Negative.   Skin: Negative.   Neurological: Negative.   Endo/Heme/Allergies: Negative.   Psychiatric/Behavioral: Negative.       Objective:   Vitals:   05/25/22 1637  BP: 138/84  Pulse: 70  Resp: 16  Temp: 98.3 F (36.8 C)  SpO2: 95%   Height: 5' (152.4 cm)  Weight: 161 lb 9.6 oz (73.3 kg)   Physical Exam Skin:    Findings: No rash.     Diagnostics: none  Assessment and Plan:   1. Idiopathic urticaria   2. Inflammatory dermatosis   3. Pruritic disorder   4. Nonallergic rhinitis    1.  DECREASE Omalizumab injection to 150 mg every 4 weeks  2.  Use a combination of the following every day:   A.  Cetirizine 10 mg - 1 tablet twice a day  B.  Famotidine 20 mg - 1 tablet twice a day  C. Shower, then mometasone 0.1%  ointment applied while wet 1-7 times a week  3. Can use nasal ipratropium 0.06% - 2 sprays each nostril every 6 hours to dry  nose  4. Obtain eye exam for runny eyes  5.  Return to clinic in 12 weeks or earlier if problem  Kyri appears to be doing better at this point in time and she will continue on omalizumab although will use a lower dose with her next injection as she does develop some redness around her eyes and feels a little fatigued for a few days after her injection.  And she has the option of using her topical mometasone at a dose that keeps her stomach and from getting inflamed and she will continue on an H1 and H2 receptor blocker at this point in time.  For her runny nose she can use nasal ipratropium.  She needs to have an eye exam which fortunately is arranged for this Friday.  I will see her back in this clinic in 12 weeks.  Allena Katz, MD Allergy / Immunology [place]

## 2022-05-26 ENCOUNTER — Encounter: Payer: Self-pay | Admitting: Allergy and Immunology

## 2022-05-28 DIAGNOSIS — H401132 Primary open-angle glaucoma, bilateral, moderate stage: Secondary | ICD-10-CM | POA: Diagnosis not present

## 2022-05-28 DIAGNOSIS — H04123 Dry eye syndrome of bilateral lacrimal glands: Secondary | ICD-10-CM | POA: Diagnosis not present

## 2022-05-28 DIAGNOSIS — H52223 Regular astigmatism, bilateral: Secondary | ICD-10-CM | POA: Diagnosis not present

## 2022-05-28 DIAGNOSIS — H524 Presbyopia: Secondary | ICD-10-CM | POA: Diagnosis not present

## 2022-05-28 DIAGNOSIS — D3132 Benign neoplasm of left choroid: Secondary | ICD-10-CM | POA: Diagnosis not present

## 2022-05-28 DIAGNOSIS — H353131 Nonexudative age-related macular degeneration, bilateral, early dry stage: Secondary | ICD-10-CM | POA: Diagnosis not present

## 2022-06-04 ENCOUNTER — Ambulatory Visit (INDEPENDENT_AMBULATORY_CARE_PROVIDER_SITE_OTHER): Payer: PPO

## 2022-06-04 DIAGNOSIS — L501 Idiopathic urticaria: Secondary | ICD-10-CM

## 2022-06-11 ENCOUNTER — Other Ambulatory Visit: Payer: Self-pay | Admitting: Family Medicine

## 2022-06-14 NOTE — Telephone Encounter (Signed)
Eliquis Last filled:  05/07/22, #60 Last OV:  04/20/22, flu A Next OV: 06/16/22, weakness

## 2022-06-15 NOTE — Telephone Encounter (Signed)
ERx 

## 2022-06-16 ENCOUNTER — Ambulatory Visit (INDEPENDENT_AMBULATORY_CARE_PROVIDER_SITE_OTHER): Payer: PPO | Admitting: Family Medicine

## 2022-06-16 ENCOUNTER — Encounter: Payer: Self-pay | Admitting: Family Medicine

## 2022-06-16 VITALS — BP 124/66 | HR 57 | Temp 97.1°F | Ht 60.0 in | Wt 162.2 lb

## 2022-06-16 DIAGNOSIS — G4733 Obstructive sleep apnea (adult) (pediatric): Secondary | ICD-10-CM | POA: Diagnosis not present

## 2022-06-16 DIAGNOSIS — F418 Other specified anxiety disorders: Secondary | ICD-10-CM

## 2022-06-16 DIAGNOSIS — E039 Hypothyroidism, unspecified: Secondary | ICD-10-CM | POA: Diagnosis not present

## 2022-06-16 DIAGNOSIS — D508 Other iron deficiency anemias: Secondary | ICD-10-CM | POA: Diagnosis not present

## 2022-06-16 DIAGNOSIS — R5383 Other fatigue: Secondary | ICD-10-CM

## 2022-06-16 DIAGNOSIS — E871 Hypo-osmolality and hyponatremia: Secondary | ICD-10-CM | POA: Diagnosis not present

## 2022-06-16 LAB — COMPREHENSIVE METABOLIC PANEL
ALT: 24 U/L (ref 0–35)
AST: 28 U/L (ref 0–37)
Albumin: 4.2 g/dL (ref 3.5–5.2)
Alkaline Phosphatase: 94 U/L (ref 39–117)
BUN: 31 mg/dL — ABNORMAL HIGH (ref 6–23)
CO2: 28 mEq/L (ref 19–32)
Calcium: 10.5 mg/dL (ref 8.4–10.5)
Chloride: 99 mEq/L (ref 96–112)
Creatinine, Ser: 1.29 mg/dL — ABNORMAL HIGH (ref 0.40–1.20)
GFR: 36.78 mL/min — ABNORMAL LOW (ref >60.00)
Glucose, Bld: 83 mg/dL (ref 70–99)
Potassium: 4.6 mEq/L (ref 3.5–5.1)
Sodium: 140 mEq/L (ref 135–145)
Total Bilirubin: 0.8 mg/dL (ref 0.2–1.2)
Total Protein: 6.6 g/dL (ref 6.0–8.3)

## 2022-06-16 LAB — CBC WITH DIFFERENTIAL/PLATELET
Basophils Absolute: 0 10*3/uL (ref 0.0–0.1)
Basophils Relative: 0.5 % (ref 0.0–3.0)
Eosinophils Absolute: 0.3 10*3/uL (ref 0.0–0.7)
Eosinophils Relative: 5.3 % — ABNORMAL HIGH (ref 0.0–5.0)
HCT: 45.6 % (ref 36.0–46.0)
Hemoglobin: 15.3 g/dL — ABNORMAL HIGH (ref 12.0–15.0)
Lymphocytes Relative: 32.8 % (ref 12.0–46.0)
Lymphs Abs: 1.8 10*3/uL (ref 0.7–4.0)
MCHC: 33.5 g/dL (ref 30.0–36.0)
MCV: 95.2 fl (ref 78.0–100.0)
Monocytes Absolute: 0.8 10*3/uL (ref 0.1–1.0)
Monocytes Relative: 15.4 % — ABNORMAL HIGH (ref 3.0–12.0)
Neutro Abs: 2.5 10*3/uL (ref 1.4–7.7)
Neutrophils Relative %: 46 % (ref 43.0–77.0)
Platelets: 218 10*3/uL (ref 150.0–400.0)
RBC: 4.79 Mil/uL (ref 3.87–5.11)
RDW: 15.3 % (ref 11.5–15.5)
WBC: 5.4 10*3/uL (ref 4.0–10.5)

## 2022-06-16 LAB — MAGNESIUM: Magnesium: 1.9 mg/dL (ref 1.5–2.5)

## 2022-06-16 LAB — VITAMIN B12: Vitamin B-12: 1146 pg/mL — ABNORMAL HIGH (ref 211–911)

## 2022-06-16 LAB — TSH: TSH: 1.75 u[IU]/mL (ref 0.35–5.50)

## 2022-06-16 MED ORDER — DULOXETINE HCL 20 MG PO CPEP: 20.0000 mg | ORAL_CAPSULE | Freq: Every day | ORAL | 6 refills | Status: DC

## 2022-06-16 NOTE — Progress Notes (Signed)
Patient ID: Brooke Beasley, female    DOB: [birth year], 89 y.o.   MRN: [id number]  This visit was conducted in person.  BP 124/66   Pulse (!) 57   Temp (!) 97.1 F (36.2 C) (Temporal)   Ht 5' (1.524 m)   Wt 162 lb 4 oz (73.6 kg)   SpO2 97%   BMI 31.69 kg/m    CC: fatigue x1 month Subjective:   HPI: Brooke Beasley is a 89 y.o. female presenting on 06/16/2022 for Fatigue (C/o feeling extremely weak. Started about 1 mo ago. Thinks it may be due to allergy shots. Also, wants to have thyroid checked due to fatigue and cold fingers. Also, c/o feeling so depressed. Pt accompanied by daughter, [name]. )   1 month h/o increased fatigue associated with depression, and daytime sleepiness. She uses BiPAP with O2 at night to sleep for known OSA. She wakes up feeling tired. + intermittent dyspnea, cold intolerance. No chest pain, pallor.   Marked deterioration in depression - normally takes klonopin 0.66m nightly. Mood worsened since strep throat and flu 04/20/2022. Previous paxil made her feel ill, doxepin caused hallucinations and palpitations. Denies SI/HI. Declines counselor referral. Previously on sertraline however this was stopped due to weakness/fatigue and SIADH contribution after hospitalization May. She continues sodium and iron MWF. She continues levothyroxine 1049m daily. Last night had episode of paresthesias to balls of feet.   She sees allergist for urticaria/inflamatory dermatitis and pruritus - has been managed with topical steroid and omalizumab (Xolair) allergy shots. Last seen 05/25/2022 at which time allergy shot frequency was decreased to 15036m4 wks. She also continued cetirizine 38m71mD, famotidine 20mg43m, and topical steroid mometasone ointment after shower. Last allergy shot was 06/04/2022, they state she received just 1 shot (lower dose) however 300mg 86mizumab dose was documented. Normally after Xolair injection she develops red and itchy eyes and fatigue.  They note she responded better on lower dose. Overall was doing well however she notes itching has come back.      Relevant past medical, surgical, family and social history reviewed and updated as indicated. Interim medical history since our last visit reviewed. Allergies and medications reviewed and updated. Outpatient Medications Prior to Visit  Medication Sig Dispense Refill   apixaban (ELIQUIS) 5 MG TABS tablet Take 1 tablet by mouth twice daily 60 tablet 0   Ascorbic Acid (VITAMIN C PO) Take by mouth.     bimatoprost (LUMIGAN) 0.01 % SOLN Place 1 drop into both eyes at bedtime.     carvedilol (COREG) 6.25 MG tablet TAKE 1 TABLET BY MOUTH TWICE DAILY WITH A MEAL 180 tablet 0   cetirizine (ZYRTEC) 10 MG tablet Take 1 tablet (10 mg total) by mouth daily as needed for allergies (Can take an extra dose during flare ups.). (Patient taking differently: Take 10 mg by mouth 2 (two) times daily.) 60 tablet 5   cholecalciferol (VITAMIN D) 1000 units tablet Take 1,000 Units by mouth daily.     clobetasol cream (TEMOVATE) 0.05 %[id number] 1 Application topically 2 (two) times daily. 30 g 0   clonazePAM (KLONOPIN) 0.5 MG tablet TAKE 1 TABLET BY MOUTH AT BEDTIME 30 tablet 0   colchicine 0.6 MG tablet Take 1 tablet (0.6 mg total) by mouth daily. 90 tablet 1   EPINEPHrine (EPIPEN 2-PAK) 0.3 mg/0.3 mL IJ SOAJ injection Inject 0.3 mg into the muscle as needed for anaphylaxis. XOLAIRArvid RightTION PROTOCOL 2 each 2   famotidine (PEPCID)  20 MG tablet Take 1 tablet (20 mg total) by mouth 2 (two) times daily. 60 tablet 5   Ferrous Sulfate (IRON) 28 MG TABS Take 1 tablet (28 mg total) by mouth every other day.     furosemide (LASIX) 20 MG tablet Take 1 tablet (20 mg total) by mouth daily. 30 tablet 6   gabapentin (NEURONTIN) 300 MG capsule TAKE 1 CAPSULE BY MOUTH ONCE DAILY AS NEEDED FOR  NERVE  PAIN  (ITCHING) 30 capsule 3   hydrALAZINE (APRESOLINE) 25 MG tablet Take 1 tablet (25 mg total) by mouth in the morning and at  bedtime. 180 tablet 1   ipratropium (ATROVENT) 0.06 % nasal spray Place 2 sprays into both nostrils 4 (four) times daily. 15 mL 5   levothyroxine (SYNTHROID) 100 MCG tablet Take 1 tablet (100 mcg total) by mouth daily before breakfast. 90 tablet 0   loperamide (IMODIUM) 2 MG capsule Take 1 capsule (2 mg total) by mouth 3 (three) times daily as needed for diarrhea or loose stools. 30 capsule 0   mometasone (ELOCON) 0.1 % ointment Shower than ointment applied while still wet daily 45 g 5   Multiple Vitamin (MULTIVITAMIN ADULT PO) Take by mouth daily.     polyethylene glycol (MIRALAX / GLYCOLAX) 17 g packet Take 17 g by mouth daily.     Potassium 99 MG TABS Take 1 tablet (99 mg total) by mouth every Monday, Wednesday, and Friday. 330 tablet    Simethicone 180 MG CAPS Take 1 capsule by mouth daily as needed (indigestion).     ALPHAGAN P 0.1 % SOLN Place 1 drop into both eyes daily.     Facility-Administered Medications Prior to Visit  Medication Dose Route Frequency Provider Last Rate Last Admin   omalizumab Arvid Right) injection 300 mg  300 mg Subcutaneous Q28 days Kozlow, Donnamarie Poag, MD   300 mg at 06/04/22 1429     Per HPI unless specifically indicated in ROS section below Review of Systems  Objective:  BP 124/66   Pulse (!) 57   Temp (!) 97.1 F (36.2 C) (Temporal)   Ht 5' (1.524 m)   Wt 162 lb 4 oz (73.6 kg)   SpO2 97%   BMI 31.69 kg/m   Wt Readings from Last 3 Encounters:  06/16/22 162 lb 4 oz (73.6 kg)  05/25/22 161 lb 9.6 oz (73.3 kg)  04/20/22 158 lb 3.2 oz (71.8 kg)      Physical Exam Vitals and nursing note reviewed.  Constitutional:      Appearance: Normal appearance. She is not ill-appearing.  HENT:     Head: Normocephalic and atraumatic.     Mouth/Throat:     Mouth: Mucous membranes are moist.     Pharynx: Oropharynx is clear. No oropharyngeal exudate or posterior oropharyngeal erythema.  Eyes:     Extraocular Movements: Extraocular movements intact.      Conjunctiva/sclera: Conjunctivae normal.     Pupils: Pupils are equal, round, and reactive to light.     Comments: No conjunctival pallor  Cardiovascular:     Rate and Rhythm: Normal rate and regular rhythm.     Pulses: Normal pulses.     Heart sounds: Murmur (3/6 systolic at USB) heard.  Pulmonary:     Effort: Pulmonary effort is normal. No respiratory distress.     Breath sounds: Normal breath sounds. No wheezing, rhonchi or rales.  Musculoskeletal:     Cervical back: Normal range of motion and neck supple.  Right lower leg: No edema.     Left lower leg: No edema.  Skin:    General: Skin is warm and dry.  Neurological:     Mental Status: She is alert.  Psychiatric:        Mood and Affect: Mood normal.        Behavior: Behavior normal.       Lab Results  Component Value Date   CREATININE 1.14 02/05/2022   BUN 24 (H) 02/05/2022   NA 143 02/05/2022   K 4.3 02/05/2022   CL 103 02/05/2022   CO2 31 02/05/2022   Lab Results  Component Value Date   TSH 1.03 03/15/2022       06/16/2022    9:18 AM 10/28/2021    4:00 PM 03/30/2021    2:10 PM 03/28/2020    3:15 PM 08/17/2019    3:45 PM  Depression screen PHQ 2/9  Decreased Interest 2 0 0 0 1  Down, Depressed, Hopeless 2 0 0 1 1  PHQ - 2 Score 4 0 0 1 2  Altered sleeping 2   2 3  $ Tired, decreased energy 3   2 2  $ Change in appetite 2   0 0  Feeling bad or failure about yourself  2   0 1  Trouble concentrating 2   0 0  Moving slowly or fidgety/restless 2   0 1  Suicidal thoughts 2   0 0  PHQ-9 Score 19   5 9  $ Difficult doing work/chores Somewhat difficult           06/16/2022    9:18 AM 03/28/2020    3:15 PM 08/17/2019    3:46 PM  GAD 7 : Generalized Anxiety Score  Nervous, Anxious, on Edge 2 0 1  Control/stop worrying 2 0 1  Worry too much - different things 2 1 1  $ Trouble relaxing 2 1 1  $ Restless 1 0 0  Easily annoyed or irritable 2 0 1  Afraid - awful might happen 2 0 0  Total GAD 7 Score 13 2 5  $ Anxiety  Difficulty Somewhat difficult     Assessment & Plan:   Problem List Items Addressed This Visit     Hypothyroidism    Update TSH.       Relevant Orders   TSH   Anxiety associated with depression    Marked deterioration noted over the past month with elevated PHQ9/GAD7 scores.  Denies SI/HI.  Previously longterm on sertraline 167m however this was stopped after hospitalization 08/2021 for weakness/hyponatremia thought SIADH related.  Will start SNRI which is least likely to cause hyponatremia/SIADH.  Start cymbalta 223mdaily in am. Reviewed risk of worsening SI/HI - and to stop immediately if this happens. Reassess at CPE 07/2022.       Relevant Medications   DULoxetine (CYMBALTA) 20 MG capsule   Hyponatremia    Thought SIADH related - update sodium off SSRI and chlorthalidone, only on lasix 2040maily.       Fatigue - Primary    Ongoing for the past 1+ month, worse since influenza/strep throat illness 04/20/2022.  Will check labwork for reversible causes of fatigue.  They wonder of Xolair contributing to fatigue - note she did better with lower dose injection 06/04/2022.       Relevant Orders   Comprehensive metabolic panel   TSH   CBC with Differential/Platelet   Magnesium   Vitamin B12   Iron deficiency anemia    Continues  slo release oral iron MWF.       OSA (obstructive sleep apnea)    Continues regular BiPAP use. Sees pulm Q6 mo - appreciate their care.        Meds ordered this encounter  Medications   DULoxetine (CYMBALTA) 20 MG capsule    Sig: Take 1 capsule (20 mg total) by mouth daily.    Dispense:  30 capsule    Refill:  6    Orders Placed This Encounter  Procedures   Comprehensive metabolic panel   TSH   CBC with Differential/Platelet   Magnesium   Vitamin B12    Patient Instructions  Start cymbalta (duloxetine) 19m daily in the morning for the mood.  Labwork today.  Keep appointment for physical.   Follow up plan: Return if symptoms  worsen or fail to improve.  JRia Bush MD

## 2022-06-16 NOTE — Assessment & Plan Note (Signed)
Ongoing for the past 1+ month, worse since influenza/strep throat illness 04/20/2022.  Will check labwork for reversible causes of fatigue.  They wonder of Xolair contributing to fatigue - note she did better with lower dose injection 06/04/2022.

## 2022-06-16 NOTE — Assessment & Plan Note (Addendum)
Thought SIADH related - update sodium off SSRI and chlorthalidone, only on lasix 80m daily.

## 2022-06-16 NOTE — Assessment & Plan Note (Signed)
Update TSH

## 2022-06-16 NOTE — Assessment & Plan Note (Addendum)
Continues slo release oral iron MWF.

## 2022-06-16 NOTE — Patient Instructions (Addendum)
Start cymbalta (duloxetine) 7m daily in the morning for the mood.  Labwork today.  Keep appointment for physical.

## 2022-06-16 NOTE — Assessment & Plan Note (Signed)
Marked deterioration noted over the past month with elevated PHQ9/GAD7 scores.  Denies SI/HI.  Previously longterm on sertraline 115m however this was stopped after hospitalization 08/2021 for weakness/hyponatremia thought SIADH related.  Will start SNRI which is least likely to cause hyponatremia/SIADH.  Start cymbalta 290mdaily in am. Reviewed risk of worsening SI/HI - and to stop immediately if this happens. Reassess at CPE 07/2022.

## 2022-06-16 NOTE — Assessment & Plan Note (Addendum)
Continues regular BiPAP use. Sees pulm Q6 mo - appreciate their care.

## 2022-06-19 ENCOUNTER — Encounter: Payer: Self-pay | Admitting: Family Medicine

## 2022-06-19 ENCOUNTER — Other Ambulatory Visit: Payer: Self-pay | Admitting: Family Medicine

## 2022-06-20 DIAGNOSIS — I5032 Chronic diastolic (congestive) heart failure: Secondary | ICD-10-CM | POA: Diagnosis not present

## 2022-06-21 NOTE — Telephone Encounter (Signed)
Refill request Clonazepam Last refill 05/25/22 #30 Last office visit 06/16/22

## 2022-06-22 NOTE — Telephone Encounter (Signed)
ERx 

## 2022-06-25 DIAGNOSIS — G4733 Obstructive sleep apnea (adult) (pediatric): Secondary | ICD-10-CM | POA: Diagnosis not present

## 2022-06-26 ENCOUNTER — Other Ambulatory Visit: Payer: Self-pay | Admitting: Allergy and Immunology

## 2022-07-02 ENCOUNTER — Encounter: Payer: Self-pay | Admitting: Cardiology

## 2022-07-02 ENCOUNTER — Ambulatory Visit: Payer: PPO

## 2022-07-02 ENCOUNTER — Ambulatory Visit: Payer: PPO | Attending: Cardiology | Admitting: Cardiology

## 2022-07-02 VITALS — BP 156/82 | HR 54 | Ht 60.0 in | Wt 166.2 lb

## 2022-07-02 DIAGNOSIS — I350 Nonrheumatic aortic (valve) stenosis: Secondary | ICD-10-CM | POA: Diagnosis not present

## 2022-07-02 DIAGNOSIS — I4892 Unspecified atrial flutter: Secondary | ICD-10-CM | POA: Diagnosis not present

## 2022-07-02 DIAGNOSIS — I10 Essential (primary) hypertension: Secondary | ICD-10-CM | POA: Diagnosis not present

## 2022-07-02 MED ORDER — CARVEDILOL 3.125 MG PO TABS: 3.1250 mg | ORAL_TABLET | Freq: Two times a day (BID) | ORAL | 3 refills | Status: DC

## 2022-07-02 NOTE — Patient Instructions (Signed)
Medication Instructions:   Decrease Carvedilol - take one tablet ( 3.'125mg'$ ) by mouth twice a day.    - if you have any 6.25 mg tablets you are more than welcome to put those in half.   *If you need a refill on your cardiac medications before your next appointment, please call your pharmacy*   Lab Work:  None Ordered  If you have labs (blood work) drawn today and your tests are completely normal, you will receive your results only by: [facility] (if you have MyChart) OR A paper copy in the mail If you have any lab test that is abnormal or we need to change your treatment, we will call you to review the results.   Testing/Procedures:  None Ordered   Follow-Up: At [facility], you and your health needs are our priority.  As part of our continuing mission to provide you with exceptional heart care, we have created designated Provider Care Teams.  These Care Teams include your primary Cardiologist (physician) and Advanced Practice Providers (APPs -  Physician Assistants and Nurse Practitioners) who all work together to provide you with the care you need, when you need it.  We recommend signing up for the patient portal called "MyChart".  Sign up information is provided on this After Visit Summary.  MyChart is used to connect with patients for Virtual Visits (Telemedicine).  Patients are able to view lab/test results, encounter notes, upcoming appointments, etc.  Non-urgent messages can be sent to your provider as well.   To learn more about what you can do with MyChart, go to [email or web address].    Your next appointment:   6 - 8  week(s)  Provider:   You may see Kate Sable, MD ONLY

## 2022-07-02 NOTE — Progress Notes (Signed)
Cardiology Office Note:    Date:  07/02/2022   ID:  Brooke Beasley, DOB 09/16/1930, MRN [id number]  PCP:  [name], MD  [facility] Cardiologist:  Kate Sable, MD  [place] Electrophysiologist:  None   Referring MD: [name], MD   Chief Complaint  Patient presents with   Follow-up    6 month follow up visit. Patient states that she gets out of breath when she walks. Meds reviewed.     History of Present Illness:    Brooke Beasley is a 89 y.o. female with a hx of anxiety, persistent atrial flutter/A-fib s/p DC cardioversion [id number], grade 2 diastolic dysfunction, hypertension, hyperlipidemia, CKD 3, mild aortic stenosis who presents for follow-up.    Patient states feeling tired of feeling, also feeling depressed, started on antidepressants/Cymbalta by PCP.  Denies palpitations or chest pain.  Compliant with Coreg and Eliquis as prescribed, no bleeding issues.  Thyroid medication recently adjusted.    Prior notes Echo TTE 07/2021 EF 55 to 60%, moderately dilated LA, moderate MR, posterior mitral leaflet prolapse Echocardiogram on 11/26/2019 showed normal systolic function, EF 60 to 123456, grade 2 diastolic dysfunction.  Mild to moderate MR, mild AS. Renal artery stenosis about 1 to 59% on the right, left renal artery unsuccessful to assess  Patient previously took lisinopril, but stopped due to side effects/cough.  Amlodipine stopped due to abdominal pains, Coreg was stopped because of fatigue.  Past Medical History:  Diagnosis Date   Anxiety    C. difficile diarrhea 07/11/2018   S/p multiple oral vanc treatments (course and tapers) and completed Zinplava monoclonal Ab treatment (05/10/2019) Fecal transplant on hold during COVID pandemic   CAP (community acquired pneumonia) 12/22/2017   Carotid stenosis 10/18/2014   R 1-39%, L 40-59%, rpt 1 yr (09/2014)    CKD (chronic kidney disease) stage 3, GFR 30-59 ml/min (HCC) 08/02/2014   Closed  fracture of right orbit ([place]) 02/10/2021   Clostridioides difficile infection    hx 2020   COVID-19 virus infection 10/13/2020   Degenerative disc disease    LS   Depression    nervous breakdonw in 1964-out of work for a year   Diverticulosis    severe by colonoscopy   Family history of adverse reaction to anesthesia    n/v   GERD (gastroesophageal reflux disease)    Glaucoma    Heart murmur 08/2011   mitral regurge - on echo    History of hiatal hernia    History of shingles    HTN (hypertension)    Hyperlipidemia    Hypertension    Hypothyroidism    IBS 11/02/2006   Influenza A 04/20/2022   Intestinal bacterial overgrowth    In small colon   Left shoulder pain 11/04/2014   Lower GI bleed 12/2020   thought diverticular complicated by ABLA with syncope and orbital fracture s/p hospitalization   Maxillary fracture ([place]) 04/08/2012   Osteoarthritis 2016   Jefm Bryant)   Osteoporosis    dexa 2011   Perirectal abscess 08/03/2021   CT guided aspiration of abscess grow Proteums mirabilis and Citrobacter koseri 07/2021, currently receiving IV Zosyn planned total 6 wks.   Pseudogout 2016   shoulders ([place])   Strep pharyngitis 04/20/2022    Past Surgical History:  Procedure Laterality Date   barium enema  2012   severe diverticulosis, redundant colon   Bowel obstruction  1999   no surgery in hosp x 3 days   BUBBLE STUDY  07/27/2021  Procedure: BUBBLE STUDY;  Surgeon: Dixie Dials, MD;  Location: [facility];  Service: Cardiovascular;;   CARDIOVERSION N/A 11/19/2021   Procedure: CARDIOVERSION;  Surgeon: Kate Sable, MD;  Location: [facility];  Service: Cardiovascular;  Laterality: N/A;   COLONOSCOPY  1. 1999  2. 11/04   1. Not finished  2. Slight hemorrhage rectosigmoid area, severe sig diverticulosis   COLONOSCOPY WITH PROPOFOL N/A 09/10/2019   SSP with dysplasia, TA, rpt 3 yrs Marius Ditch, Tally Due, MD)   Dexa  1. [phone]   2. 9/04  3. 3/08    1. OP  2. OP,  borderline, spine -2.44T  3. decreased BMD-OP   DG KNEE 1-2 VIEWS BILAT     LS x-ray with degenerative disc and facet change   ESOPHAGOGASTRODUODENOSCOPY     Negative   FLEXIBLE SIGMOIDOSCOPY N/A 01/16/2021   Procedure: FLEXIBLE SIGMOIDOSCOPY;  Surgeon: Gatha Mayer, MD;  Location: [facility];  Service: Endoscopy;  Laterality: N/A;  or unsedated   Hemorrhoid procedure  07/2006   PICC LINE REMOVAL ([place] HX)  08/28/2021   PROCTOSCOPY N/A 05/26/2021   Procedure: RIGID PROCTOSCOPY;  Surgeon: Michael Boston, MD;  Location: [facility];  Service: General;  Laterality: N/A;   RECTOPEXY N/A 05/26/2021   Procedure: RECTOPEXY;  Surgeon: Michael Boston, MD;  Location: [facility];  Service: General;  Laterality: N/A;   TEE WITHOUT CARDIOVERSION N/A 07/27/2021   Procedure: TRANSESOPHAGEAL ECHOCARDIOGRAM (TEE);  Surgeon: Dixie Dials, MD;  Location: [facility];  Service: Cardiovascular;  Laterality: N/A;   TONSILLECTOMY     TOTAL SHOULDER ARTHROPLASTY Left 11/27/2015   Corky Mull, MD   TOTAL SHOULDER REVISION Left 03/16/2016   Procedure: TOTAL SHOULDER REVISION;  Surgeon: Corky Mull, MD;  Location: [facility];  Service: Orthopedics;  Laterality: Left;   US ECHOCARDIOGRAPHY  [id number]   Normal systolic fxn with EF 0000000.  Focal basal septal hypertrophy.  Mild diastolic dysfunction.  Mild MR.   XI ROBOTIC ASSISTED LOWER ANTERIOR RESECTION N/A 05/26/2021   Procedure: ROBOTIC LOW ANTERIOR RECTOSIGMOID RESECTION; ASSESSMENT OF TISSUE PERFUSION WITH FIREFLY;  Surgeon: Michael Boston, MD;  Location: [facility];  Service: General;  Laterality: N/A;    Current Medications: Current Meds  Medication Sig   apixaban (ELIQUIS) 5 MG TABS tablet Take 1 tablet by mouth twice daily   Ascorbic Acid (VITAMIN C PO) Take by mouth.   bimatoprost (LUMIGAN) 0.01 % SOLN Place 1 drop into both eyes at bedtime.   cetirizine (ZYRTEC) 10 MG tablet Take 1 tablet by mouth once daily   cholecalciferol (VITAMIN D) 1000 units tablet Take  1,000 Units by mouth daily.   clobetasol cream (TEMOVATE) [id number] % Apply 1 Application topically 2 (two) times daily.   clonazePAM (KLONOPIN) 0.5 MG tablet TAKE 1 TABLET BY MOUTH AT BEDTIME   colchicine 0.6 MG tablet Take 1 tablet (0.6 mg total) by mouth daily.   DULoxetine (CYMBALTA) 20 MG capsule Take 1 capsule (20 mg total) by mouth daily.   EPINEPHrine (EPIPEN 2-PAK) 0.3 mg/0.3 mL IJ SOAJ injection Inject 0.3 mg into the muscle as needed for anaphylaxis. Arvid Right INJECTION PROTOCOL   famotidine (PEPCID) 20 MG tablet Take 1 tablet (20 mg total) by mouth 2 (two) times daily.   Ferrous Sulfate (IRON) 28 MG TABS Take 1 tablet (28 mg total) by mouth every other day.   furosemide (LASIX) 20 MG tablet Take 1 tablet (20 mg total) by mouth daily.   gabapentin (NEURONTIN) 300 MG capsule TAKE 1 CAPSULE BY  MOUTH ONCE DAILY AS NEEDED FOR  NERVE  PAIN  (ITCHING)   hydrALAZINE (APRESOLINE) 25 MG tablet Take 1 tablet (25 mg total) by mouth in the morning and at bedtime.   ipratropium (ATROVENT) 0.06 % nasal spray Place 2 sprays into both nostrils 4 (four) times daily.   levothyroxine (SYNTHROID) 100 MCG tablet Take 1 tablet (100 mcg total) by mouth daily before breakfast.   loperamide (IMODIUM) 2 MG capsule Take 1 capsule (2 mg total) by mouth 3 (three) times daily as needed for diarrhea or loose stools.   mometasone (ELOCON) 0.1 % ointment Shower than ointment applied while still wet daily   Multiple Vitamin (MULTIVITAMIN ADULT PO) Take by mouth daily.   polyethylene glycol (MIRALAX / GLYCOLAX) 17 g packet Take 17 g by mouth daily.   Potassium 99 MG TABS Take 1 tablet (99 mg total) by mouth every Monday, Wednesday, and Friday.   Simethicone 180 MG CAPS Take 1 capsule by mouth daily as needed (indigestion).   timolol (TIMOPTIC) 0.5 % ophthalmic solution Place 1 drop into both eyes every morning.   [DISCONTINUED] carvedilol (COREG) 6.25 MG tablet TAKE 1 TABLET BY MOUTH TWICE DAILY WITH A MEAL   Current  Facility-Administered Medications for the 07/02/22 encounter (Office Visit) with Kate Sable, MD  Medication   omalizumab Arvid Right) injection 300 mg     Allergies:   Amlodipine, Ace inhibitors, Carvedilol, Chlorthalidone, Losartan, Montelukast, Other, Paroxetine hcl, Risedronate sodium, Simvastatin, Toprol xl [metoprolol], Alendronate sodium, Influenza vaccines, Silenor [doxepin hcl], and Sulfonamide derivatives   Social History   Socioeconomic History   Marital status: Widowed    Spouse name: Not on file   Number of children: 2   Years of education: Not on file   Highest education level: Not on file  Occupational History   Occupation: Retired    Fish farm manager: RETIRED  Tobacco Use   Smoking status: Never   Smokeless tobacco: Never  Vaping Use   Vaping Use: Never used  Substance and Sexual Activity   Alcohol use: No   Drug use: No   Sexual activity: Not Currently  Other Topics Concern   Not on file  Social History Narrative   Left handed   Widow. Husband ([name]) deceased 18-Mar-2016 from dementia. She was caregiver.    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   Activity: no regular exercise   Diet: good water, fruits/vegetables daily      Patient Care Team:   [name], MD as PCP - General (Family Medicine)   Kate Sable, MD as PCP - Cardiology (Cardiology)   Dannielle Karvonen, RN as [place] [facility]   Gross, Remo Lipps, MD as Consulting Physician (General Surgery)   Lin Landsman, MD as Consulting Physician (Gastroenterology)   Kate Sable, MD as Consulting Physician (Cardiology)   Social Determinants of Health   Financial Resource Strain: Low Risk  (03/30/2021)   Overall Financial Resource Strain (CARDIA)    Difficulty of Paying Living Expenses: Not very hard  Food Insecurity: No Food Insecurity (03/30/2021)   Hunger Vital Sign    Worried About Running Out  of Food in the Last Year: Never true    [place] in the Last Year: Never true  Transportation Needs: No Transportation Needs (03/30/2021)   PRAPARE - Transportation    Lack of Transportation (Medical): No    Lack of Transportation (Non-Medical): No  Physical Activity: Insufficiently Active (03/30/2021)   Exercise Vital Sign    Days of Exercise per Week: 7 days    Minutes of Exercise per Session: 10 min  Stress: No Stress Concern Present (03/30/2021)   [place]    Feeling of Stress : Only a little  Social Connections: Socially Isolated (03/30/2021)   Social Connection and Isolation Panel [NHANES]    Frequency of Communication with Friends and Family: More than three times a week    Frequency of Social Gatherings with Friends and Family: Three times a week    Attends Religious Services: Never    Active Member of Clubs or Organizations: No    Attends Archivist Meetings: Never    Marital Status: Widowed     Family History: The patient's family history includes Breast cancer in her cousin; Coronary artery disease in her brother; Diabetes in her brother and paternal grandfather.  ROS:   Please see the history of present illness.     All other systems reviewed and are negative.  EKGs/Labs/Other Studies Reviewed:    The following studies were reviewed today:   EKG:  EKG is ordered today.  EKG shows atrial fibrillation, heart rate 54  Recent Labs: 06/16/2022: ALT 24; BUN 31; Creatinine, Ser 1.29; Hemoglobin 15.3; Magnesium 1.9; Platelets 218.0; Potassium 4.6; Sodium 140; TSH 1.75  Recent Lipid Panel    Component Value Date/Time   CHOL 179 03/27/2021 0935   TRIG 69 03/27/2021 0935   HDL 71 03/27/2021 0935   CHOLHDL 2.5 03/27/2021 0935   CHOLHDL 3.6 03/17/2020 1519   VLDL 27 03/17/2020 1519   LDLCALC 95 03/27/2021 0935   LDLDIRECT 86.0 09/19/2014 1049    Physical Exam:    VS:  BP (!) 156/82 (BP  Location: Left Arm, Patient Position: Sitting, Cuff Size: Normal)   Pulse (!) 54   Ht 5' (1.524 m)   Wt 166 lb 3.2 oz (75.4 kg)   SpO2 96%   BMI 32.46 kg/m     Wt Readings from Last 3 Encounters:  07/02/22 166 lb 3.2 oz (75.4 kg)  06/16/22 162 lb 4 oz (73.6 kg)  05/25/22 161 lb 9.6 oz (73.3 kg)     GEN:  Well nourished, well developed in no acute distress HEENT: Normal NECK: No JVD; No carotid bruits CARDIAC: Irregular irregular, 3/6 systolic murmur RESPIRATORY:  Clear to auscultation without rales, wheezing or rhonchi  ABDOMEN: Soft, non-tender, non-distended MUSCULOSKELETAL: No edema, no deformity  SKIN: Warm and dry NEUROLOGIC:  Alert and oriented x 3 PSYCHIATRIC:  Normal affect   ASSESSMENT:    1. Atrial flutter, unspecified type ([place])   2. Primary hypertension   3. Aortic valve stenosis, etiology of cardiac valve disease unspecified   4. Hypertension, essential    PLAN:    In order of problems listed above:  Atrial flutter, s/p DC cardioversion 10/2021.  EKG today shows A-fib.  Slow ventricular response heart rate 54.  Reduce Coreg to 3.125 mg twice daily, continue Eliquis.  Refer to A-fib clinic.  Avoiding amiodarone due to thyroid dysfunction.  Hypertension, BP elevated today, usually controlled.  Patient complaining of fatigue.  Heart rate low.  Reduce carvedilol to 3.125 mg twice daily.  Continue hydralazine.  Consider stopping Coreg at follow-up visit based on heart rate medication symptoms. Mild aortic stenosis, last echo 06/2021 unchanged from prior.  EF 65 to 70%.  Continue to monitor serially  Follow-up in 6 to 8  weeks.    Medication Adjustments/Labs and Tests Ordered: Current medicines are reviewed at length with the patient today.  Concerns regarding medicines are outlined above.  Orders Placed This Encounter  Procedures   Ambulatory referral to Cardiac Electrophysiology   EKG 12-Lead    Meds ordered this encounter  Medications   carvedilol (COREG)  3.125 MG tablet    Sig: Take 1 tablet (3.125 mg total) by mouth 2 (two) times daily with a meal.    Dispense:  90 tablet    Refill:  3      Patient Instructions  Medication Instructions:   Decrease Carvedilol - take one tablet ( 3.'125mg'$ ) by mouth twice a day.    - if you have any 6.25 mg tablets you are more than welcome to put those in half.   *If you need a refill on your cardiac medications before your next appointment, please call your pharmacy*   Lab Work:  None Ordered  If you have labs (blood work) drawn today and your tests are completely normal, you will receive your results only by: [place] (if you have MyChart) OR A paper copy in the mail If you have any lab test that is abnormal or we need to change your treatment, we will call you to review the results.   Testing/Procedures:  None Ordered   Follow-Up: At [facility], you and your health needs are our priority.  As part of our continuing mission to provide you with exceptional heart care, we have created designated Provider Care Teams.  These Care Teams include your primary Cardiologist (physician) and Advanced Practice Providers (APPs -  Physician Assistants and Nurse Practitioners) who all work together to provide you with the care you need, when you need it.  We recommend signing up for the patient portal called "MyChart".  Sign up information is provided on this After Visit Summary.  MyChart is used to connect with patients for Virtual Visits (Telemedicine).  Patients are able to view lab/test results, encounter notes, upcoming appointments, etc.  Non-urgent messages can be sent to your provider as well.   To learn more about what you can do with MyChart, go to [email or web address].    Your next appointment:   6 - 8  week(s)  Provider:   You may see Kate Sable, MD ONLY      Signed, Kate Sable, MD  07/02/2022 4:22 PM    [place]

## 2022-07-07 ENCOUNTER — Encounter: Payer: Self-pay | Admitting: Cardiology

## 2022-07-07 ENCOUNTER — Ambulatory Visit (INDEPENDENT_AMBULATORY_CARE_PROVIDER_SITE_OTHER): Payer: PPO

## 2022-07-07 DIAGNOSIS — L501 Idiopathic urticaria: Secondary | ICD-10-CM

## 2022-07-07 MED ORDER — OMALIZUMAB 150 MG/ML ~~LOC~~ SOSY
150.0000 mg | PREFILLED_SYRINGE | SUBCUTANEOUS | Status: DC
  Administered 2022-07-07 – 2022-12-29 (×7): 150 mg via SUBCUTANEOUS

## 2022-07-08 ENCOUNTER — Telehealth: Payer: Self-pay

## 2022-07-08 MED ORDER — HYDRALAZINE HCL 25 MG PO TABS: 25.0000 mg | ORAL_TABLET | Freq: Three times a day (TID) | ORAL | 3 refills | Status: DC

## 2022-07-08 NOTE — Telephone Encounter (Signed)
Medication list updated with Dr. Garen Lah recommendations.

## 2022-07-09 DIAGNOSIS — H401132 Primary open-angle glaucoma, bilateral, moderate stage: Secondary | ICD-10-CM | POA: Diagnosis not present

## 2022-07-11 ENCOUNTER — Encounter: Payer: Self-pay | Admitting: Cardiology

## 2022-07-17 ENCOUNTER — Other Ambulatory Visit: Payer: Self-pay | Admitting: Family Medicine

## 2022-07-19 ENCOUNTER — Encounter: Payer: Self-pay | Admitting: Cardiology

## 2022-07-19 ENCOUNTER — Telehealth: Payer: Self-pay | Admitting: Cardiology

## 2022-07-19 DIAGNOSIS — I5032 Chronic diastolic (congestive) heart failure: Secondary | ICD-10-CM | POA: Diagnosis not present

## 2022-07-19 NOTE — Telephone Encounter (Signed)
Left message to call back  

## 2022-07-19 NOTE — Telephone Encounter (Signed)
   Pt c/o BP issue: STAT if pt c/o blurred vision, one-sided weakness or slurred speech  1. What are your last 5 BP readings?  175/100 last night  173/-- this morning  can't remember the bottom number  2. Are you having any other symptoms (ex. Dizziness, headache, blurred vision, passed out)? fatigue  3. What is your BP issue? Pt said, would like to f/u her BP if its normal or still too high. She said, she only feel tired

## 2022-07-19 NOTE — Telephone Encounter (Signed)
Refill request Eliquis Last office visit 23/21/24 Last refill 06/15/22 #60

## 2022-07-19 NOTE — Telephone Encounter (Signed)
Pt called to report despite increasing hydralazine to 25 mg 3 times a day, blood pressure continues to remain elevated (reading listed below). Pt also reported due to high readings, she's been taking carvedilol every morning for roughly a week. Pt stated she's been very fatigue every morning and last night felt very anxious. She also reported she took an extra 1/2 tablet of carvedilol.   3/17-191/111 3/23-154/91 3/24-175/100 3/25-173/96  Will forward to MD for recommendations.

## 2022-07-20 ENCOUNTER — Other Ambulatory Visit: Payer: Self-pay

## 2022-07-20 MED ORDER — HYDRALAZINE HCL 50 MG PO TABS: 50.0000 mg | ORAL_TABLET | Freq: Three times a day (TID) | ORAL | 0 refills | Status: DC

## 2022-07-20 NOTE — Telephone Encounter (Signed)
Please see mychart encounter for full conversation.  Medication sent.

## 2022-07-24 ENCOUNTER — Other Ambulatory Visit: Payer: Self-pay | Admitting: Family Medicine

## 2022-07-26 NOTE — Telephone Encounter (Signed)
LAST APPOINTMENT DATE: 07/17/2022   NEXT APPOINTMENT DATE: 08/06/2022    LAST REFILL: 06/22/22  QTY: #30 w/ no refills

## 2022-07-27 NOTE — Telephone Encounter (Signed)
ERx 

## 2022-07-30 ENCOUNTER — Telehealth: Payer: Self-pay | Admitting: Cardiology

## 2022-07-30 NOTE — Telephone Encounter (Signed)
Pt c/o BP issue: STAT if pt c/o blurred vision, one-sided weakness or slurred speech  1. What are your last 5 BP readings? 164/94 176/100  2. Are you having any other symptoms (ex. Dizziness, headache, blurred vision, passed out)? Headache and blurred vision blurred vision   3. What is your BP issue? Patient left MyChart message in regard to high BP she states she can not get to come down and would like to be advised on how to continue her medication.

## 2022-07-30 NOTE — Telephone Encounter (Signed)
Patient stated that her BP has been steadily increasing with the last 2 BP readings being 164/94 and 176/100. Patient is now experiencing headaches and vision has become blurred.   [1] Systolic BP  >= 160 OR Diastolic >= 100 AND [2] cardiac or neurologic symptoms  R/O: hypertensive emergency  Care Advice:  1: GO TO ED NOW: * You need to be seen in the Emergency Department. * Go to the ED at [facility]. * Leave now. Drive carefully.  2: NOTE TO TRIAGER - DRIVING: * Another adult should drive. * Patient should not delay going to the emergency department. * If immediate transportation is not available via car, rideshare (e.g., Lyft, Uber), or taxi, then the patient should be instructed to call EMS-911.  3: CALL EMS 911 IF: * Passes out or faints * Becomes confused * Becomes too weak to stand * You become worse  Patient instructed to present to ED with current symptoms and to have daughter drive her. Patient's daughter understood with readback.

## 2022-08-02 ENCOUNTER — Other Ambulatory Visit: Payer: Self-pay | Admitting: *Deleted

## 2022-08-02 MED ORDER — HYDRALAZINE HCL 100 MG PO TABS: 100.0000 mg | ORAL_TABLET | Freq: Three times a day (TID) | ORAL | Status: DC

## 2022-08-04 ENCOUNTER — Encounter: Payer: Self-pay | Admitting: Cardiology

## 2022-08-04 ENCOUNTER — Ambulatory Visit: Payer: PPO | Attending: Cardiology | Admitting: Cardiology

## 2022-08-04 ENCOUNTER — Ambulatory Visit (INDEPENDENT_AMBULATORY_CARE_PROVIDER_SITE_OTHER): Payer: PPO

## 2022-08-04 VITALS — BP 118/68 | HR 82 | Ht 60.0 in | Wt 166.0 lb

## 2022-08-04 DIAGNOSIS — I10 Essential (primary) hypertension: Secondary | ICD-10-CM | POA: Diagnosis not present

## 2022-08-04 DIAGNOSIS — I4892 Unspecified atrial flutter: Secondary | ICD-10-CM | POA: Diagnosis not present

## 2022-08-04 DIAGNOSIS — I350 Nonrheumatic aortic (valve) stenosis: Secondary | ICD-10-CM | POA: Diagnosis not present

## 2022-08-04 DIAGNOSIS — L501 Idiopathic urticaria: Secondary | ICD-10-CM | POA: Diagnosis not present

## 2022-08-04 NOTE — Progress Notes (Signed)
  Electrophysiology Office Note:    Date:  08/04/2022   ID:  Brooke Beasley, DOB 12/19/1930, MRN [id number]  [facility] Cardiologist:  Debbe Odea, MD  [facility] Electrophysiologist:  Lanier Prude, MD   Referring MD: Debbe Odea, MD   Chief Complaint: Atrial flutter  History of Present Illness:    Brooke Beasley is a 89 y.o. female who I am seeing today for an evaluation of atrial flutter at the request of Dr. Azucena Cecil.  The patient last saw Dr. Azucena Cecil July 02, 2022.  The patient has a medical history that includes persistent atrial fibrillation and flutter, diastolic heart failure, hypertension, hyperlipidemia, mild aortic stenosis.  The patient is had a cardioversion for her arrhythmia in July 2023.  At the last appointment with Dr. Azucena Cecil she was back in atrial fibrillation with a slow ventricular rate of 54 bpm.  Her carvedilol was reduced to 3.125 daily.  She takes Eliquis for stroke prophylaxis.  She is with her daughter today in clinic.  She reports no symptoms related to her atrial fibrillation other than potentially some mild fatigue.  She is able to accomplish her ADLs.  She is able to go shopping.  She takes her blood thinner without any off target effects.     Their past medical, social and family history was reveiwed.   ROS:   Please see the history of present illness.    All other systems reviewed and are negative.  EKGs/Labs/Other Studies Reviewed:    The following studies were reviewed today:  July 02, 2022 EKG shows atrial fibrillation with a slow ventricular rate of 54 bpm  December 25, 2021 EKG shows sinus rhythm   Physical Exam:    VS:  BP 118/68   Pulse 82   Ht 5' (1.524 m)   Wt 166 lb (75.3 kg)   SpO2 96%   BMI 32.42 kg/m     Wt Readings from Last 3 Encounters:  08/04/22 166 lb (75.3 kg)  07/02/22 166 lb 3.2 oz (75.4 kg)  06/16/22 162 lb 4 oz (73.6 kg)     GEN:  Well nourished, well  developed in no acute distress CARDIAC: Irregularly irregular rhythm, 2 out of 6 systolic murmur RESPIRATORY:  Clear to auscultation without rales, wheezing or rhonchi       ASSESSMENT AND PLAN:    1. Atrial flutter, unspecified type   2. Aortic valve stenosis, etiology of cardiac valve disease unspecified   3. Primary hypertension     #Atrial fibrillation and flutter Rate controlled.  Minimal symptoms.  We discussed the available treatment options including cardioversion, antiarrhythmic drugs or catheter ablation.  Given her advanced age she is not a candidate for catheter ablation.  I think the potential for off target effects is significant with antiarrhythmic drug therapy from his [place].  Given the risk/benefit balance for antiarrhythmics versus continued conservative management, we mutually decided to continue with a conservative management option/rate control.  She will continue taking her Eliquis for stroke prophylaxis.  #Aortic stenosis Chronic issue for her.  Could also be contributing to some of her symptoms.  #Hypertension At goal today.  Recommend checking blood pressures 1-2 times per week at home and recording the values.  Recommend bringing these recordings to the primary care physician.    Follow-up with EP on an as-needed basis.     Signed, Rossie Muskrat. Lalla Brothers, MD, [facility], [facility] 08/04/2022 8:23 PM    Electrophysiology [facility]

## 2022-08-04 NOTE — Patient Instructions (Signed)
Medication Instructions:  Your physician recommends that you continue on your current medications as directed. Please refer to the Current Medication list given to you today.  *If you need a refill on your cardiac medications before your next appointment, please call your pharmacy*  Follow-Up: At [facility], you and your health needs are our priority.  As part of our continuing mission to provide you with exceptional heart care, we have created designated Provider Care Teams.  These Care Teams include your primary Cardiologist (physician) and Advanced Practice Providers (APPs -  Physician Assistants and Nurse Practitioners) who all work together to provide you with the care you need, when you need it.  Your next appointment:   As needed with Dr. Lambert 

## 2022-08-05 ENCOUNTER — Other Ambulatory Visit: Payer: Self-pay | Admitting: Family Medicine

## 2022-08-05 DIAGNOSIS — D508 Other iron deficiency anemias: Secondary | ICD-10-CM

## 2022-08-05 DIAGNOSIS — M4647 Discitis, unspecified, lumbosacral region: Secondary | ICD-10-CM

## 2022-08-05 DIAGNOSIS — M810 Age-related osteoporosis without current pathological fracture: Secondary | ICD-10-CM

## 2022-08-05 DIAGNOSIS — E039 Hypothyroidism, unspecified: Secondary | ICD-10-CM

## 2022-08-05 DIAGNOSIS — N1831 Chronic kidney disease, stage 3a: Secondary | ICD-10-CM

## 2022-08-05 DIAGNOSIS — R7303 Prediabetes: Secondary | ICD-10-CM

## 2022-08-06 ENCOUNTER — Other Ambulatory Visit (INDEPENDENT_AMBULATORY_CARE_PROVIDER_SITE_OTHER): Payer: PPO

## 2022-08-06 DIAGNOSIS — M810 Age-related osteoporosis without current pathological fracture: Secondary | ICD-10-CM

## 2022-08-06 DIAGNOSIS — D508 Other iron deficiency anemias: Secondary | ICD-10-CM | POA: Diagnosis not present

## 2022-08-06 DIAGNOSIS — R7303 Prediabetes: Secondary | ICD-10-CM | POA: Diagnosis not present

## 2022-08-06 DIAGNOSIS — N1831 Chronic kidney disease, stage 3a: Secondary | ICD-10-CM

## 2022-08-06 LAB — CBC WITH DIFFERENTIAL/PLATELET
Basophils Absolute: 0 10*3/uL (ref 0.0–0.1)
Basophils Relative: 0.4 % (ref 0.0–3.0)
Eosinophils Absolute: 0.2 10*3/uL (ref 0.0–0.7)
Eosinophils Relative: 4.5 % (ref 0.0–5.0)
HCT: 47.7 % — ABNORMAL HIGH (ref 36.0–46.0)
Hemoglobin: 15.6 g/dL — ABNORMAL HIGH (ref 12.0–15.0)
Lymphocytes Relative: 40 % (ref 12.0–46.0)
Lymphs Abs: 1.9 10*3/uL (ref 0.7–4.0)
MCHC: 32.8 g/dL (ref 30.0–36.0)
MCV: 96.9 fl (ref 78.0–100.0)
Monocytes Absolute: 0.7 10*3/uL (ref 0.1–1.0)
Monocytes Relative: 15.2 % — ABNORMAL HIGH (ref 3.0–12.0)
Neutro Abs: 1.9 10*3/uL (ref 1.4–7.7)
Neutrophils Relative %: 39.9 % — ABNORMAL LOW (ref 43.0–77.0)
Platelets: 240 10*3/uL (ref 150.0–400.0)
RBC: 4.93 Mil/uL (ref 3.87–5.11)
RDW: 14.1 % (ref 11.5–15.5)
WBC: 4.6 10*3/uL (ref 4.0–10.5)

## 2022-08-06 LAB — IBC PANEL
Iron: 100 ug/dL (ref 42–145)
Saturation Ratios: 27.8 % (ref 20.0–50.0)
TIBC: 359.8 ug/dL (ref 250.0–450.0)
Transferrin: 257 mg/dL (ref 212.0–360.0)

## 2022-08-06 LAB — HEMOGLOBIN A1C: Hgb A1c MFr Bld: 5.7 % (ref 4.6–6.5)

## 2022-08-06 LAB — COMPREHENSIVE METABOLIC PANEL
ALT: 25 U/L (ref 0–35)
AST: 28 U/L (ref 0–37)
Albumin: 4.2 g/dL (ref 3.5–5.2)
Alkaline Phosphatase: 102 U/L (ref 39–117)
BUN: 23 mg/dL (ref 6–23)
CO2: 35 mEq/L — ABNORMAL HIGH (ref 19–32)
Calcium: 9.6 mg/dL (ref 8.4–10.5)
Chloride: 96 mEq/L (ref 96–112)
Creatinine, Ser: 1.14 mg/dL (ref 0.40–1.20)
GFR: 42.62 mL/min — ABNORMAL LOW (ref >60.00)
Glucose, Bld: 101 mg/dL — ABNORMAL HIGH (ref 70–99)
Potassium: 4.2 mEq/L (ref 3.5–5.1)
Sodium: 137 mEq/L (ref 135–145)
Total Bilirubin: 0.8 mg/dL (ref 0.2–1.2)
Total Protein: 6.6 g/dL (ref 6.0–8.3)

## 2022-08-06 LAB — LIPID PANEL
Cholesterol: 181 mg/dL (ref 0–200)
HDL: 64.7 mg/dL (ref >39.00)
LDL Cholesterol: 101 mg/dL — ABNORMAL HIGH (ref 0–99)
NonHDL: 116.66
Total CHOL/HDL Ratio: 3
Triglycerides: 79 mg/dL (ref 0.0–149.0)
VLDL: 15.8 mg/dL (ref 0.0–40.0)

## 2022-08-06 LAB — FERRITIN: Ferritin: 102.9 ng/mL (ref 10.0–291.0)

## 2022-08-06 LAB — VITAMIN D 25 HYDROXY (VIT D DEFICIENCY, FRACTURES): VITD: 39.6 ng/mL (ref 30.00–100.00)

## 2022-08-06 LAB — MICROALBUMIN / CREATININE URINE RATIO
Creatinine,U: 87.4 mg/dL
Microalb Creat Ratio: 2 mg/g (ref 0.0–30.0)
Microalb, Ur: 1.8 mg/dL (ref 0.0–1.9)

## 2022-08-11 ENCOUNTER — Encounter: Payer: Self-pay | Admitting: Cardiology

## 2022-08-12 NOTE — Telephone Encounter (Signed)
Called pt advised of MD recommendation:  Patient should take hydralazine 100 mg 3 times daily standing.  Not just when BP is elevated.  Check BP once a day only.  Follow-up in the office for further adjustments or recommendations.  Thank you    Pt wants to know what she should do if BP is over 200.  Per pt is currently taking hydralazine 100 mg PO TID.  Advised pt to take BP once daily 2 hours after taking BP medication.  Reports BP today was okay around 118/?Marland Kitchen

## 2022-08-13 ENCOUNTER — Encounter: Payer: Self-pay | Admitting: Family Medicine

## 2022-08-13 ENCOUNTER — Ambulatory Visit (INDEPENDENT_AMBULATORY_CARE_PROVIDER_SITE_OTHER): Payer: PPO | Admitting: Family Medicine

## 2022-08-13 VITALS — BP 182/100 | HR 77 | Temp 97.6°F | Ht 60.75 in | Wt 166.0 lb

## 2022-08-13 DIAGNOSIS — K219 Gastro-esophageal reflux disease without esophagitis: Secondary | ICD-10-CM

## 2022-08-13 DIAGNOSIS — D751 Secondary polycythemia: Secondary | ICD-10-CM | POA: Diagnosis not present

## 2022-08-13 DIAGNOSIS — M1120 Other chondrocalcinosis, unspecified site: Secondary | ICD-10-CM

## 2022-08-13 DIAGNOSIS — R7303 Prediabetes: Secondary | ICD-10-CM

## 2022-08-13 DIAGNOSIS — D508 Other iron deficiency anemias: Secondary | ICD-10-CM

## 2022-08-13 DIAGNOSIS — M810 Age-related osteoporosis without current pathological fracture: Secondary | ICD-10-CM

## 2022-08-13 DIAGNOSIS — M19041 Primary osteoarthritis, right hand: Secondary | ICD-10-CM

## 2022-08-13 DIAGNOSIS — Z0000 Encounter for general adult medical examination without abnormal findings: Secondary | ICD-10-CM | POA: Diagnosis not present

## 2022-08-13 DIAGNOSIS — I4892 Unspecified atrial flutter: Secondary | ICD-10-CM

## 2022-08-13 DIAGNOSIS — H9193 Unspecified hearing loss, bilateral: Secondary | ICD-10-CM

## 2022-08-13 DIAGNOSIS — H409 Unspecified glaucoma: Secondary | ICD-10-CM

## 2022-08-13 DIAGNOSIS — G4733 Obstructive sleep apnea (adult) (pediatric): Secondary | ICD-10-CM

## 2022-08-13 DIAGNOSIS — K581 Irritable bowel syndrome with constipation: Secondary | ICD-10-CM

## 2022-08-13 DIAGNOSIS — F418 Other specified anxiety disorders: Secondary | ICD-10-CM

## 2022-08-13 DIAGNOSIS — L501 Idiopathic urticaria: Secondary | ICD-10-CM | POA: Insufficient documentation

## 2022-08-13 DIAGNOSIS — N1832 Chronic kidney disease, stage 3b: Secondary | ICD-10-CM

## 2022-08-13 DIAGNOSIS — I10 Essential (primary) hypertension: Secondary | ICD-10-CM

## 2022-08-13 DIAGNOSIS — E039 Hypothyroidism, unspecified: Secondary | ICD-10-CM

## 2022-08-13 DIAGNOSIS — L299 Pruritus, unspecified: Secondary | ICD-10-CM

## 2022-08-13 DIAGNOSIS — R5382 Chronic fatigue, unspecified: Secondary | ICD-10-CM

## 2022-08-13 MED ORDER — GABAPENTIN 300 MG PO CAPS: ORAL_CAPSULE | ORAL | 3 refills | Status: DC

## 2022-08-13 MED ORDER — APIXABAN 5 MG PO TABS: 5.0000 mg | ORAL_TABLET | Freq: Two times a day (BID) | ORAL | 11 refills | Status: DC

## 2022-08-13 MED ORDER — LEVOTHYROXINE SODIUM 100 MCG PO TABS
100.0000 ug | ORAL_TABLET | Freq: Every day | ORAL | 4 refills | Status: DC
Start: 2022-08-13 — End: 2023-08-22

## 2022-08-13 MED ORDER — DULOXETINE HCL 20 MG PO CPEP: 20.0000 mg | ORAL_CAPSULE | Freq: Every day | ORAL | 4 refills | Status: DC

## 2022-08-13 MED ORDER — SPIRONOLACTONE 25 MG PO TABS: 25.0000 mg | ORAL_TABLET | Freq: Every day | ORAL | 4 refills | Status: DC

## 2022-08-13 MED ORDER — FUROSEMIDE 20 MG PO TABS: 20.0000 mg | ORAL_TABLET | Freq: Every day | ORAL | 4 refills | Status: DC

## 2022-08-13 MED ORDER — CARVEDILOL 3.125 MG PO TABS
3.1250 mg | ORAL_TABLET | Freq: Every day | ORAL | 3 refills | Status: DC
Start: 2022-08-13 — End: 2022-08-25

## 2022-08-13 NOTE — Assessment & Plan Note (Signed)
She continues slo release iron MWF

## 2022-08-13 NOTE — Assessment & Plan Note (Signed)
Continues wearing hearing aides with limited benefit.

## 2022-08-13 NOTE — Patient Instructions (Addendum)
Call to schedule mammogram at your convenience: [facility] [phone] Blood pressure remains too high - restart carvedilol 3.125mg  nightly - monitor blood pressures, let us and Dr Azucena Cecil know how you do with this.  Good to see you today Return in 1 month for repeat labs to check blood counts as today were concentrated. If staying elevated, may refer to hematologist.  Return as needed or in 4-6 months for follow up visit

## 2022-08-13 NOTE — Assessment & Plan Note (Addendum)
Continues regular BiPAP use. Needs to schedule pulm appt.

## 2022-08-13 NOTE — Assessment & Plan Note (Signed)
Continue pepcid  BID - using for urticaria. Not on PPI

## 2022-08-13 NOTE — Assessment & Plan Note (Signed)
Mildly elevated today, along with increase on CO2 levels.  She continues BiPAP and nightly supplemental oxygen. Recheck levels in 1 month

## 2022-08-13 NOTE — Assessment & Plan Note (Signed)

## 2022-08-13 NOTE — Assessment & Plan Note (Signed)
Chronic, stable 

## 2022-08-13 NOTE — Assessment & Plan Note (Signed)
Ongoing. Stopping BB hasn't significantly helped. See below re: recommencement

## 2022-08-13 NOTE — Assessment & Plan Note (Signed)
Appreciate allergist care, on omalizumab injections

## 2022-08-13 NOTE — Assessment & Plan Note (Signed)
Preventative protocols reviewed and updated unless pt declined. Discussed healthy diet and lifestyle.  

## 2022-08-13 NOTE — Assessment & Plan Note (Addendum)
Ongoing chronic pruritus - improving with allergist treatment.  Continues omalizumab injections along with zyrtec  and pepcid  both BID.

## 2022-08-13 NOTE — Assessment & Plan Note (Addendum)
Chronic, markedly elevated today. They note BP tends to increase in the evenings.  Current regimen of spironolactone  daily, lasix  daily, hydralazine  TID not helping control blood pressures.  Significant drug allergies/intolerances.  Will restart low dose carvedilol 3.125mg  (once daily) nightly and I asked them to notify us and cardiology about effect.  Noted carvedilol - colchicine drug interaction - will need to limit dose Current pulse should tolerate this.

## 2022-08-13 NOTE — Assessment & Plan Note (Signed)
Saw EP x1. Continues to see cardiology. Continue eliquis. See below re carvedilol.

## 2022-08-13 NOTE — Assessment & Plan Note (Signed)
Regularly sees eye doctor, on timolol drops

## 2022-08-13 NOTE — Assessment & Plan Note (Signed)
Encouraged good hydration. Avoid NSAIDs

## 2022-08-13 NOTE — Assessment & Plan Note (Addendum)
Appreciate rheumatology care, now on colchicine 0.6mg  daily.

## 2022-08-13 NOTE — Progress Notes (Signed)
Ph: [phone]       Fax: [phone]   Patient ID: Brooke Beasley, female    DOB: 1931-01-02, 89 y.o.   MRN: [id number]  This visit was conducted in person.  BP (!) 182/100 (BP Location: Right Arm, Cuff Size: Normal)   Pulse 77   Temp 97.6 F (36.4 C) (Temporal)   Ht 5' 0.75" (1.543 m)   Wt 166 lb (75.3 kg)   SpO2 95%   BMI 31.62 kg/m   BP Readings from Last 3 Encounters:  08/13/22 (!) 182/100  08/04/22 118/68  07/02/22 (!) 156/82   CC: CPE/AMW Subjective:   HPI: Brooke Beasley is a 89 y.o. female presenting on 08/13/2022 for Medicare Wellness (Pt accompanied by daughter, [name]. )   Did not see health advisor this year.  Hearing Screening - Comments:: Pt wears B hearing aids. Wearing at today's OV.  Vision Screening - Comments:: Last eye exam, 05/2022.  Flowsheet Row Office Visit from 08/13/2022 in [facility]  PHQ-2 Total Score 2          08/13/2022    3:59 PM 06/16/2022    9:18 AM 10/28/2021    4:00 PM 08/26/2021    2:16 PM 03/30/2021    2:07 PM  Fall Risk   Falls in the past year? 1 1 1 1 1   Number falls in past yr: 0 0 1 1 0  Injury with Fall?  1 1 1 1   Comment     facial fracture  Risk for fall due to :    History of fall(s);Impaired balance/gait;Impaired mobility Other (Comment)  Risk for fall due to: Comment     GI bleeding  Follow up   Falls evaluation completed Falls evaluation completed Falls prevention discussed   Deteriorated mood off SSRI (stopped due to hyponatremia)  - last visit we started SNRI cymbalta 20mg  daily - she feels this has helped, declines increased dose. Cymbalta chosen as least likely to cause hyponatremia.   Pruritus with idiopathic urticaria - followed by allergist Dr Lucie Leather on omalizumab injection with benefit (recently decreased to 150mg  Q4 wks, continues zyrtec 10mg  bid and pepcid 20mg  bid, as well as mometasone 0.1% oint after shower. Upcoming appt next week. She continues  itching but no longer rah, overall better.   Recurrent atrial fibrillation and flutter - saw cardiology and EP - now back on hydralazine 100mg  TID, furosemide 20mg  daily and spironolactone 25mg  daily. She is on potassium 99mg  MWF. Carvedilol most recently stopped due to low pulse. She is on eliquis. Released from EP care due to stability.   Pseudogout followed by rheumatology - she is taking colchicine 0.6mg  daily with option to take BID if needed  OSA - continues BiPAP nightly with oxygen, sees pulmonology   HTN - elevated blood pressure recently. Using small part of carvedilol if BP runs high  Preventative: Colonoscopy 08/2019 - 2 SSP, TA, hemorrhoids, diverticulosis, rectal prolapse ([place]) Well woman - last done about 12 yrs ago. Aged out.  Mammogram 07/2020 Resa Miner at [facility] - needs to schedule.  Lung cancer screen - not eligible  DEXA - 2011 osteoporosis. Pt does not take calcium (causes constipation) but does take vit D. Fosamax caused bone pain. prolia started 04/2019, latest shot 11/08/2019. Didn't take 2022 due to cost.  DEXA forearm T -4.2, hip -1.4 (01/2018) DEXA 07/2020 - T -4.3 at forearm, -1.3 at L femoral neck Flu shot - declines - nervous  breakdown after flu shot remotely.  COVID vaccine Pfizer 07/2019, 08/2019, no booster Pneumovax 2004.  Prevnar-13 05/2013.  Td 2010  Zostavax 2012  Shingrix - discussed, hesitant due to itching  Advanced directives - scanned into chart 10/2015. Daughter Jerl Santos then Earleen Reaper are HCPOA. Does not want life sustaining measures if terminally ill.  Seat belt use discussed  Sunscreen use discussed. No changing moles on skin. Saw derm last year  Non smoker Alcohol - none  Dentist - does not see, has full dentures Eye exam - Q6 mo - glaucoma  Bowel - chronic constipation managed with miralax Bladder - intermittent leaking worse, with diuretic   Left handed Widow. Husband ([name]) deceased 01-Apr-2016 from dementia. She was  caregiver.  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 Interim medical history since our last visit reviewed. Allergies and medications reviewed and updated. Outpatient Medications Prior to Visit  Medication Sig Dispense Refill   Ascorbic Acid (VITAMIN C PO) Take by mouth.     bimatoprost (LUMIGAN) 0.01 % SOLN Place 1 drop into both eyes at bedtime.     cetirizine (ZYRTEC) 10 MG tablet Take 1 tablet by mouth once daily (Patient taking differently: Take 10 mg by mouth daily. Takes 2 tablets once a day) 90 tablet 0   cholecalciferol (VITAMIN D) 1000 units tablet Take 1,000 Units by mouth daily.     clobetasol cream (TEMOVATE) 0.05 % Apply 1 Application topically 2 (two) times daily. 30 g 0   clonazePAM (KLONOPIN) 0.5 MG tablet TAKE 1 TABLET BY MOUTH AT BEDTIME 30 tablet 0   colchicine 0.6 MG tablet Take 1 tablet (0.6 mg total) by mouth daily. 90 tablet 1   EPINEPHrine (EPIPEN 2-PAK) 0.3 mg/0.3 mL IJ SOAJ injection Inject 0.3 mg into the muscle as needed for anaphylaxis. Geoffry Paradise INJECTION PROTOCOL 2 each 2   famotidine (PEPCID) 20 MG tablet Take 1 tablet (20 mg total) by mouth 2 (two) times daily. 60 tablet 5   Ferrous Sulfate (IRON) 28 MG TABS Take 1 tablet (28 mg total) by mouth every other day.     hydrALAZINE (APRESOLINE) 100 MG tablet Take 1 tablet (100 mg total) by mouth 3 (three) times daily.     loperamide (IMODIUM) 2 MG capsule Take 1 capsule (2 mg total) by mouth 3 (three) times daily as needed for diarrhea or loose stools. 30 capsule 0   mometasone (ELOCON) 0.1 % ointment Shower than ointment applied while still wet daily 45 g 5   Multiple Vitamin (MULTIVITAMIN ADULT PO) Take by mouth daily.     polyethylene glycol (MIRALAX / GLYCOLAX) 17 g packet  Take 17 g by mouth daily.     Potassium 99 MG TABS Take 1 tablet (99 mg total) by mouth every Monday, Wednesday, and Friday. 330 tablet    Simethicone 180 MG CAPS Take 1 capsule by mouth daily as needed (indigestion).     timolol (TIMOPTIC) 0.5 % ophthalmic solution Place 1 drop into both eyes every morning.     DULoxetine (CYMBALTA) 20 MG capsule Take 1 capsule (20 mg total) by mouth daily. 30 capsule 6   ELIQUIS 5 MG TABS tablet Take 1 tablet by mouth twice daily 60 tablet 0   furosemide (LASIX) 20 MG tablet Take 1  tablet (20 mg total) by mouth daily. 30 tablet 6   gabapentin (NEURONTIN) 300 MG capsule TAKE 1 CAPSULE BY MOUTH ONCE DAILY AS NEEDED FOR  NERVE  PAIN  (ITCHING) 30 capsule 3   levothyroxine (SYNTHROID) 100 MCG tablet Take 1 tablet (100 mcg total) by mouth daily before breakfast. 90 tablet 0   ipratropium (ATROVENT) 0.06 % nasal spray Place 2 sprays into both nostrils 4 (four) times daily. (Patient not taking: Reported on 08/13/2022) 15 mL 5   Facility-Administered Medications Prior to Visit  Medication Dose Route Frequency Provider Last Rate Last Admin   omalizumab Geoffry Paradise) prefilled syringe 150 mg  150 mg Subcutaneous Q28 days Jessica Priest, MD   150 mg at 08/04/22 9528     Per HPI unless specifically indicated in ROS section below Review of Systems  Constitutional:  Negative for activity change, appetite change, chills, fatigue, fever and unexpected weight change.  HENT:  Negative for hearing loss.   Eyes:  Negative for visual disturbance.  Respiratory:  Negative for cough, chest tightness, shortness of breath and wheezing.   Cardiovascular:  Positive for palpitations. Negative for chest pain and leg swelling.  Gastrointestinal:  Positive for abdominal pain and constipation (manages with miralax). Negative for abdominal distention, blood in stool, diarrhea, nausea and vomiting.  Genitourinary:  Negative for difficulty urinating and hematuria.  Musculoskeletal:  Negative for  arthralgias, myalgias and neck pain.  Skin:  Negative for rash.  Neurological:  Positive for headaches. Negative for dizziness, seizures and syncope.  Hematological:  Negative for adenopathy. Does not bruise/bleed easily.  Psychiatric/Behavioral:  Negative for dysphoric mood. The patient is not nervous/anxious.     Objective:  BP (!) 182/100 (BP Location: Right Arm, Cuff Size: Normal)   Pulse 77   Temp 97.6 F (36.4 C) (Temporal)   Ht 5' 0.75" (1.543 m)   Wt 166 lb (75.3 kg)   SpO2 95%   BMI 31.62 kg/m   Wt Readings from Last 3 Encounters:  08/13/22 166 lb (75.3 kg)  08/04/22 166 lb (75.3 kg)  07/02/22 166 lb 3.2 oz (75.4 kg)      Physical Exam Vitals and nursing note reviewed.  Constitutional:      Appearance: Normal appearance. She is not ill-appearing.  HENT:     Head: Normocephalic and atraumatic.     Right Ear: Tympanic membrane, ear canal and external ear normal. There is no impacted cerumen.     Left Ear: Tympanic membrane, ear canal and external ear normal. There is no impacted cerumen.     Mouth/Throat:     Mouth: Mucous membranes are moist.     Pharynx: No oropharyngeal exudate or posterior oropharyngeal erythema.  Eyes:     General:        Right eye: No discharge.        Left eye: No discharge.     Extraocular Movements: Extraocular movements intact.     Conjunctiva/sclera: Conjunctivae normal.     Pupils: Pupils are equal, round, and reactive to light.  Neck:     Thyroid: No thyroid mass or thyromegaly.  Cardiovascular:     Rate and Rhythm: Normal rate and regular rhythm.     Pulses: Normal pulses.     Heart sounds: Normal heart sounds. No murmur heard. Pulmonary:     Effort: Pulmonary effort is normal. No respiratory distress.     Breath sounds: Normal breath sounds. No wheezing, rhonchi or rales.  Abdominal:     General: Bowel  sounds are normal. There is no distension.     Palpations: Abdomen is soft. There is no mass.     Tenderness: There is no  abdominal tenderness. There is no guarding or rebound.     Hernia: No hernia is present.  Musculoskeletal:     Cervical back: Normal range of motion and neck supple. No rigidity.     Right lower leg: No edema.     Left lower leg: No edema.  Lymphadenopathy:     Cervical: No cervical adenopathy.  Skin:    General: Skin is warm and dry.     Findings: No rash.  Neurological:     General: No focal deficit present.     Mental Status: She is alert. Mental status is at baseline.     Comments: Recall 3/3  Psychiatric:        Mood and Affect: Mood normal.        Behavior: Behavior normal.       Results for orders placed or performed in visit on 08/06/22  IBC panel  Result Value Ref Range   Iron 100 42 - 145 ug/dL   Transferrin 409.8 119.1 - 360.0 mg/dL   Saturation Ratios 47.8 20.0 - 50.0 %   TIBC 359.8 250.0 - 450.0 mcg/dL  Ferritin  Result Value Ref Range   Ferritin 102.9 10.0 - 291.0 ng/mL  VITAMIN D 25 Hydroxy (Vit-D Deficiency, Fractures)  Result Value Ref Range   VITD 39.60 30.00 - 100.00 ng/mL  Microalbumin / creatinine urine ratio  Result Value Ref Range   Microalb, Ur 1.8 0.0 - 1.9 mg/dL   Creatinine,U 29.5 mg/dL   Microalb Creat Ratio 2.0 0.0 - 30.0 mg/g  Comprehensive metabolic panel  Result Value Ref Range   Sodium 137 135 - 145 mEq/L   Potassium 4.2 3.5 - 5.1 mEq/L   Chloride 96 96 - 112 mEq/L   CO2 35 (H) 19 - 32 mEq/L   Glucose, Bld 101 (H) 70 - 99 mg/dL   BUN 23 6 - 23 mg/dL   Creatinine, Ser 6.21 0.40 - 1.20 mg/dL   Total Bilirubin 0.8 0.2 - 1.2 mg/dL   Alkaline Phosphatase 102 39 - 117 U/L   AST 28 0 - 37 U/L   ALT 25 0 - 35 U/L   Total Protein 6.6 6.0 - 8.3 g/dL   Albumin 4.2 3.5 - 5.2 g/dL   GFR 30.86 (L) >57.84 mL/min   Calcium 9.6 8.4 - 10.5 mg/dL  Lipid panel  Result Value Ref Range   Cholesterol 181 0 - 200 mg/dL   Triglycerides 69.6 0.0 - 149.0 mg/dL   HDL 29.52 >84.13 mg/dL   VLDL 24.4 0.0 - 01.0 mg/dL   LDL Cholesterol 272 (H) 0 - 99  mg/dL   Total CHOL/HDL Ratio 3    NonHDL 116.66   CBC with Differential/Platelet  Result Value Ref Range   WBC 4.6 4.0 - 10.5 K/uL   RBC 4.93 3.87 - 5.11 Mil/uL   Hemoglobin 15.6 (H) 12.0 - 15.0 g/dL   HCT 53.6 (H) 64.4 - 03.4 %   MCV 96.9 78.0 - 100.0 fl   MCHC 32.8 30.0 - 36.0 g/dL   RDW 74.2 59.5 - 63.8 %   Platelets 240.0 150.0 - 400.0 K/uL   Neutrophils Relative % 39.9 (L) 43.0 - 77.0 %   Lymphocytes Relative 40.0 12.0 - 46.0 %   Monocytes Relative 15.2 (H) 3.0 - 12.0 %   Eosinophils Relative 4.5 0.0 -  5.0 %   Basophils Relative 0.4 0.0 - 3.0 %   Neutro Abs 1.9 1.4 - 7.7 K/uL   Lymphs Abs 1.9 0.7 - 4.0 K/uL   Monocytes Absolute 0.7 0.1 - 1.0 K/uL   Eosinophils Absolute 0.2 0.0 - 0.7 K/uL   Basophils Absolute 0.0 0.0 - 0.1 K/uL  Hemoglobin A1c  Result Value Ref Range   Hgb A1c MFr Bld 5.7 4.6 - 6.5 %   Lab Results  Component Value Date   TSH 1.75 06/16/2022      08/13/2022    4:00 PM 06/16/2022    9:18 AM 10/28/2021    4:00 PM 03/30/2021    2:10 PM 03/28/2020    3:15 PM  Depression screen PHQ 2/9  Decreased Interest 1 2 0 0 0  Down, Depressed, Hopeless 1 2 0 0 1  PHQ - 2 Score 2 4 0 0 1  Altered sleeping Tired, decreased energy Change in appetite 1 2   0  Feeling bad or failure about yourself  1 2   0  Trouble concentrating 1 2   0  Moving slowly or fidgety/restless 1 2   0  Suicidal thoughts 0 2   0  PHQ-9 Score Difficult doing work/chores Not difficult at all Somewhat difficult          08/13/2022    4:00 PM 06/16/2022    9:18 AM 03/28/2020    3:15 PM 08/17/2019    3:46 PM  GAD 7 : Generalized Anxiety Score  Nervous, Anxious, on Edge 2 2 0 1  Control/stop worrying 2 2 0 1  Worry too much - different things Trouble relaxing Restless 1 1 0 0  Easily annoyed or irritable 1 2 0 1  Afraid - awful might happen 1 2 0 0  Total GAD 7 Score Anxiety Difficulty  Somewhat difficult     Assessment & Plan:    Problem List Items Addressed This Visit     Medicare annual wellness visit, subsequent - Primary (Chronic)    I have personally reviewed the Medicare Annual Wellness questionnaire and have noted 1. The patient's medical and social history 2. Their use of alcohol, tobacco or illicit drugs 3. Their current medications and supplements 4. The patient's functional ability including ADL's, fall risks, home safety risks and hearing or visual impairment. Cognitive function has been assessed and addressed as indicated.  5. Diet and physical activity 6. Evidence for depression or mood disorders The patients weight, height, BMI have been recorded in the chart. I have made referrals, counseling and provided education to the patient based on review of the above and I have provided the pt with a written personalized care plan for preventive services. Provider list updated.. See scanned questionairre as needed for further documentation. Reviewed preventative protocols and updated unless pt declined.       Health maintenance examination (Chronic)    Preventative protocols reviewed and updated unless pt declined. Discussed healthy diet and lifestyle.       Hypothyroidism    Chronic, stable on levothyroxine daily - continue.       Relevant Medications   levothyroxine (SYNTHROID) 100 MCG tablet   carvedilol (COREG) 3.125 MG tablet   Glaucoma    Regularly sees eye doctor, on timolol drops  Hypertension, essential    Chronic, markedly elevated today. They note BP tends to increase in the evenings.  Current regimen of spironolactone 25mg  daily, lasix 20mg  daily, hydralazine 100mg  TID not helping control blood pressures.  Significant drug allergies/intolerances.  Will restart low dose carvedilol 3.125mg  (once daily) nightly and I asked them to notify us and cardiology about effect.  Noted carvedilol - colchicine drug interaction - will need to limit dose Current pulse should tolerate  this.       Relevant Medications   apixaban (ELIQUIS) 5 MG TABS tablet   furosemide (LASIX) 20 MG tablet   spironolactone (ALDACTONE) 25 MG tablet   carvedilol (COREG) 3.125 MG tablet   Irritable bowel syndrome with constipation   Osteoporosis    Currently not on bone strengthening med.  Will send mychart message to see what they would like to do. I've gone ahead and ordered DEXA scan so she can get done at same time as mammogram.       Relevant Orders   DG Bone Density   Prediabetes    Chronic, stable.       Anxiety associated with depression    Doing better on low dose cymbalta.  Cymbalta chosen as least likely to cause hyponatremia.  Continue this.       Relevant Medications   DULoxetine (CYMBALTA) 20 MG capsule   GERD (gastroesophageal reflux disease)    Continue pepcid 20mg  BID - using for urticaria. Not on PPI       Primary osteoarthritis of both hands   Stage 3b chronic kidney disease    Encouraged good hydration. Avoid NSAIDs      Pseudogout    Appreciate rheumatology care, now on colchicine 0.6mg  daily.       Fatigue    Ongoing. Stopping BB hasn't significantly helped. See below re: recommencement      Iron deficiency anemia    She continues slo release iron MWF      Pruritic condition    Ongoing chronic pruritus - improving with allergist treatment.  Continues omalizumab injections along with zyrtec 10mg  and pepcid 20mg  both BID.      Bilateral hearing loss    Continues wearing hearing aides with limited benefit.       Atrial flutter    Saw EP x1. Continues to see cardiology. Continue eliquis. See below re carvedilol.       Relevant Medications   apixaban (ELIQUIS) 5 MG TABS tablet   furosemide (LASIX) 20 MG tablet   spironolactone (ALDACTONE) 25 MG tablet   carvedilol (COREG) 3.125 MG tablet   OSA (obstructive sleep apnea)    Continues regular BiPAP use. Needs to schedule pulm appt.       Idiopathic urticaria    Appreciate allergist  care, on omalizumab injections      Polycythemia    Mildly elevated today, along with increase on CO2 levels.  She continues BiPAP and nightly supplemental oxygen. Recheck levels in 1 month       Relevant Orders   CBC with Differential/Platelet   Pathologist smear review   Sedimentation rate     Meds ordered this encounter  Medications   levothyroxine (SYNTHROID) 100 MCG tablet    Sig: Take 1 tablet (100 mcg total) by mouth daily before breakfast.    Dispense:  90 tablet    Refill:  4   DULoxetine (CYMBALTA) 20 MG capsule    Sig: Take 1 capsule (20 mg total) by mouth daily.  Dispense:  90 capsule    Refill:  4   apixaban (ELIQUIS) 5 MG TABS tablet    Sig: Take 1 tablet (5 mg total) by mouth 2 (two) times daily.    Dispense:  60 tablet    Refill:  11   furosemide (LASIX) 20 MG tablet    Sig: Take 1 tablet (20 mg total) by mouth daily.    Dispense:  90 tablet    Refill:  4   gabapentin (NEURONTIN) 300 MG capsule    Sig: TAKE 1 CAPSULE BY MOUTH ONCE DAILY AS NEEDED FOR  NERVE  PAIN  (ITCHING)    Dispense:  30 capsule    Refill:  3   spironolactone (ALDACTONE) 25 MG tablet    Sig: Take 1 tablet (25 mg total) by mouth daily.    Dispense:  90 tablet    Refill:  4   carvedilol (COREG) 3.125 MG tablet    Sig: Take 1 tablet (3.125 mg total) by mouth at bedtime.    Dispense:  30 tablet    Refill:  3    Orders Placed This Encounter  Procedures   DG Bone Density    Standing Status:   Future    Standing Expiration Date:   08/13/2023    Order Specific Question:   Reason for Exam (SYMPTOM  OR DIAGNOSIS REQUIRED)    Answer:   osteoporosis    Order Specific Question:   Preferred imaging location?    [facility]   CBC with Differential/Platelet    Standing Status:   Future    Standing Expiration Date:   08/13/2023   Pathologist smear review    Standing Status:   Future    Standing Expiration Date:   08/13/2023   Sedimentation rate    Standing Status:    Future    Standing Expiration Date:   08/13/2023    Patient Instructions  Call to schedule mammogram at your convenience: [facility] [phone] Blood pressure remains too high - restart carvedilol 3.125mg  nightly - monitor blood pressures, let us and Dr Azucena Cecil know how you do with this.  Good to see you today Return in 1 month for repeat labs to check blood counts as today were concentrated. If staying elevated, may refer to hematologist.  Return as needed or in 4-6 months for follow up visit  Follow up plan: Return in about 4 months (around 12/13/2022), or if symptoms worsen or fail to improve, for follow up visit.  Eustaquio Boyden, MD

## 2022-08-13 NOTE — Assessment & Plan Note (Addendum)
Currently not on bone strengthening med.  Will send mychart message to see what they would like to do. I've gone ahead and ordered DEXA scan so she can get done at same time as mammogram.

## 2022-08-13 NOTE — Assessment & Plan Note (Signed)
Chronic, stable on levothyroxine 100mcg daily - continue.  

## 2022-08-13 NOTE — Assessment & Plan Note (Signed)
Doing better on low dose cymbalta.  Cymbalta chosen as least likely to cause hyponatremia.  Continue this.

## 2022-08-14 ENCOUNTER — Other Ambulatory Visit: Payer: Self-pay | Admitting: Allergy and Immunology

## 2022-08-17 ENCOUNTER — Encounter: Payer: Self-pay | Admitting: Allergy and Immunology

## 2022-08-17 ENCOUNTER — Other Ambulatory Visit: Payer: Self-pay

## 2022-08-17 ENCOUNTER — Ambulatory Visit (INDEPENDENT_AMBULATORY_CARE_PROVIDER_SITE_OTHER): Payer: PPO | Admitting: Allergy and Immunology

## 2022-08-17 VITALS — BP 130/86 | HR 74 | Temp 98.3°F | Resp 16 | Ht 60.0 in | Wt 166.7 lb

## 2022-08-17 DIAGNOSIS — L299 Pruritus, unspecified: Secondary | ICD-10-CM

## 2022-08-17 DIAGNOSIS — L501 Idiopathic urticaria: Secondary | ICD-10-CM | POA: Diagnosis not present

## 2022-08-17 DIAGNOSIS — L989 Disorder of the skin and subcutaneous tissue, unspecified: Secondary | ICD-10-CM

## 2022-08-17 MED ORDER — FAMOTIDINE 20 MG PO TABS: 20.0000 mg | ORAL_TABLET | Freq: Two times a day (BID) | ORAL | 1 refills | Status: DC

## 2022-08-17 MED ORDER — CETIRIZINE HCL 10 MG PO TABS: 10.0000 mg | ORAL_TABLET | Freq: Two times a day (BID) | ORAL | 1 refills | Status: DC

## 2022-08-17 MED ORDER — MOMETASONE FUROATE 0.1 % EX OINT: TOPICAL_OINTMENT | Freq: Every day | CUTANEOUS | 1 refills | Status: DC

## 2022-08-17 NOTE — Progress Notes (Unsigned)
Brooke Beasley - [place] [facility] [name]   Follow-up Note  Referring Provider: Eustaquio Boyden, MD Primary Provider: Eustaquio Boyden, MD Date of Office Visit: 08/17/2022  Subjective:   Brooke Beasley (DOB: [birth year]) is a 89 y.o. female who [facility] in re-evaluation of the following:  HPI: Brooke Beasley returns to this clinic in evaluation of urticaria/inflammatory dermatosis/pruritic disorder.  I last saw her in this clinic 25 May 2022.  Currently she is about 50% better while using omalizumab, H1 receptor blocker, H2 receptor blocker, and a topical steroid to itchy spots.  During her last visit we decreased her omalizumab from 300 mg to 150 mg as the 300 mg was making her fatigued for a day or 2 and fortunately that side effect appears to have resolved with the lower dose of omalizumab.  During her last visit she was having lots of runny nose and we gave her ipratropium nasal spray but she had no need to use that nasal spray as she is no longer having a runny nose.  Is interesting to note that she was also having runny eyes at that point in time and since she has had a change of her topical eyedrops prescribed by her ophthalmologist for her glaucoma her nose is no longer running.  She informs me that she is having lots of problems with her blood pressure with very high readings.  She apparently was taken off of carvedilol because her pulse rate was in the 50s, although she had no symptoms from this low pulse rate, and when she did come off that beta-blocker she had very significant spikes of her blood pressure.  She is apparently back on her beta-blocker and she has an appointment to see her cardiologist this week.  Allergies as of 08/17/2022       Reactions   Ace Inhibitors Cough   Amlodipine Nausea Only, Other (See Comments)   Stomach pains--"feel like my insides are on fire"   Carvedilol Other (See Comments)    fatigue   Chlorthalidone Other (See Comments)   Severe hyponatremia   Montelukast Other (See Comments)   Slurred speech; really drowsy   Other Other (See Comments)   No seeds or corn because of IBS   Risedronate Sodium Other (See Comments)   REACTION: joint pain   Simvastatin Other (See Comments)   REACTION: the full 20 mg pill causes leg pain- can tol 10 mg   Alendronate Sodium Palpitations   Influenza Vaccines Rash, Other (See Comments)   Led to mental breakdown in 1960s   Losartan Other (See Comments)   Cr bumped   Paroxetine Hcl Other (See Comments)   Not effective - felt ill on this medicine   Silenor [doxepin Hcl] Palpitations   Hallucinations and racing heart   Sulfonamide Derivatives Rash   Toprol Xl [metoprolol] Diarrhea   Diarrhea and weakness        Medication List    apixaban 5 MG Tabs tablet Commonly known as: Eliquis Take 1 tablet (5 mg total) by mouth 2 (two) times daily.   bimatoprost 0.01 % Soln Commonly known as: LUMIGAN Place 1 drop into both eyes at bedtime.   carvedilol 3.125 MG tablet Commonly known as: COREG Take 1 tablet (3.125 mg total) by mouth at bedtime.   cetirizine 10 MG tablet Commonly known as: ZYRTEC Take 1 tablet by mouth once daily   cholecalciferol 1000 units tablet Commonly known as: VITAMIN D  Take 1,000 Units by mouth daily.   clobetasol cream 0.05 % Commonly known as: TEMOVATE Apply 1 Application topically 2 (two) times daily.   clonazePAM 0.5 MG tablet Commonly known as: KLONOPIN TAKE 1 TABLET BY MOUTH AT BEDTIME   colchicine 0.6 MG tablet Take 1 tablet (0.6 mg total) by mouth daily.   DULoxetine 20 MG capsule Commonly known as: Cymbalta Take 1 capsule (20 mg total) by mouth daily.   EPINEPHrine 0.3 mg/0.3 mL Soaj injection Commonly known as: EpiPen 2-Pak Inject 0.3 mg into the muscle as needed for anaphylaxis. [place] INJECTION PROTOCOL   famotidine 20 MG tablet Commonly known as: PEPCID Take 1 tablet (20  mg total) by mouth 2 (two) times daily.   furosemide 20 MG tablet Commonly known as: LASIX Take 1 tablet (20 mg total) by mouth daily.   gabapentin 300 MG capsule Commonly known as: NEURONTIN TAKE 1 CAPSULE BY MOUTH ONCE DAILY AS NEEDED FOR  NERVE  PAIN  (ITCHING)   hydrALAZINE 100 MG tablet Commonly known as: APRESOLINE Take 1 tablet (100 mg total) by mouth 3 (three) times daily.   ipratropium 0.06 % nasal spray Commonly known as: ATROVENT Place 2 sprays into both nostrils 4 (four) times daily.   Iron 28 MG Tabs Take 1 tablet (28 mg total) by mouth every other day.   levothyroxine 100 MCG tablet Commonly known as: Synthroid Take 1 tablet (100 mcg total) by mouth daily before breakfast.   loperamide 2 MG capsule Commonly known as: IMODIUM Take 1 capsule (2 mg total) by mouth 3 (three) times daily as needed for diarrhea or loose stools.   mometasone 0.1 % ointment Commonly known as: ELOCON Shower than ointment applied while still wet daily   MULTIVITAMIN ADULT PO Take by mouth daily.   polyethylene glycol 17 g packet Commonly known as: MIRALAX / GLYCOLAX Take 17 g by mouth daily.   Potassium 99 MG Tabs Take 1 tablet (99 mg total) by mouth every Monday, Wednesday, and Friday.   Simethicone 180 MG Caps Take 1 capsule by mouth daily as needed (indigestion).   spironolactone 25 MG tablet Commonly known as: ALDACTONE Take 1 tablet (25 mg total) by mouth daily.   timolol 0.5 % ophthalmic solution Commonly known as: TIMOPTIC Place 1 drop into both eyes every morning.   VITAMIN C PO Take by mouth.    Past Medical History:  Diagnosis Date   Anxiety    C. difficile diarrhea 07/11/2018   S/p multiple oral vanc treatments (course and tapers) and completed Zinplava monoclonal Ab treatment (05/10/2019) Fecal transplant on hold during COVID pandemic   CAP (community acquired pneumonia) 12/22/2017   Carotid stenosis 10/18/2014   R 1-39%, L 40-59%, rpt 1 yr (09/2014)     CKD (chronic kidney disease) stage 3, GFR 30-59 ml/min 08/02/2014   Closed fracture of right orbit 02/10/2021   Clostridioides difficile infection    hx 2020   COVID-19 virus infection 10/13/2020   Degenerative disc disease    LS   Depression    nervous breakdonw in 1964-out of work for a year   Diverticulosis    severe by colonoscopy   Family history of adverse reaction to anesthesia    n/v   GERD (gastroesophageal reflux disease)    Glaucoma    Heart murmur 08/2011   mitral regurge - on echo    History of hiatal hernia    History of shingles    HTN (hypertension)    Hyperlipidemia  Hypertension    Hypothyroidism    IBS 11/02/2006   Influenza A 04/20/2022   Intestinal bacterial overgrowth    In small colon   Left shoulder pain 11/04/2014   Lower GI bleed 12/2020   thought diverticular complicated by ABLA with syncope and orbital fracture s/p hospitalization   Maxillary fracture 04/08/2012   Osteoarthritis 2016   [place])   Osteoporosis    dexa 2011   Perirectal abscess 08/03/2021   CT guided aspiration of abscess grow Proteums mirabilis and Citrobacter koseri 07/2021, currently receiving IV Zosyn planned total 6 wks.   Pseudogout 2016   shoulders ([place])   Strep pharyngitis 04/20/2022    Past Surgical History:  Procedure Laterality Date   barium enema  2012   severe diverticulosis, redundant colon   Bowel obstruction  1999   no surgery in hosp x 3 days   BUBBLE STUDY  07/27/2021   Procedure: BUBBLE STUDY;  Surgeon: Orpah Cobb, MD;  Location: [facility] ENDOSCOPY;  Service: Cardiovascular;;   CARDIOVERSION N/A 11/19/2021   Procedure: CARDIOVERSION;  Surgeon: Debbe Odea, MD;  Location: [facility] ORS;  Service: Cardiovascular;  Laterality: N/A;   COLONOSCOPY  1. 1999  2. 11/04   1. Not finished  2. Slight hemorrhage rectosigmoid area, severe sig diverticulosis   COLONOSCOPY WITH PROPOFOL N/A 09/10/2019   SSP with dysplasia, TA, rpt 3 yrs Allegra Lai, Loel Dubonnet, MD)    Dexa  1. [phone]   2. 9/04  3. 3/08    1. OP  2. OP, borderline, spine -2.44T  3. decreased BMD-OP   DG KNEE 1-2 VIEWS BILAT     LS x-ray with degenerative disc and facet change   ESOPHAGOGASTRODUODENOSCOPY     Negative   FLEXIBLE SIGMOIDOSCOPY N/A 01/16/2021   Procedure: FLEXIBLE SIGMOIDOSCOPY;  Surgeon: Iva Boop, MD;  Location: [facility] ENDOSCOPY;  Service: Endoscopy;  Laterality: N/A;  or unsedated   Hemorrhoid procedure  07/2006   PICC LINE REMOVAL ([facility] HX)  08/28/2021   PROCTOSCOPY N/A 05/26/2021   Procedure: RIGID PROCTOSCOPY;  Surgeon: Karie Soda, MD;  Location: [facility] ORS;  Service: General;  Laterality: N/A;   RECTOPEXY N/A 05/26/2021   Procedure: RECTOPEXY;  Surgeon: Karie Soda, MD;  Location: [facility] ORS;  Service: General;  Laterality: N/A;   TEE WITHOUT CARDIOVERSION N/A 07/27/2021   Procedure: TRANSESOPHAGEAL ECHOCARDIOGRAM (TEE);  Surgeon: Orpah Cobb, MD;  Location: [facility] ENDOSCOPY;  Service: Cardiovascular;  Laterality: N/A;   TONSILLECTOMY     TOTAL SHOULDER ARTHROPLASTY Left 11/27/2015   Christena Flake, MD   TOTAL SHOULDER REVISION Left 03/16/2016   Procedure: TOTAL SHOULDER REVISION;  Surgeon: Christena Flake, MD;  Location: [facility] ORS;  Service: Orthopedics;  Laterality: Left;   US ECHOCARDIOGRAPHY  07/2011   Normal systolic fxn with EF 55-60%.  Focal basal septal hypertrophy.  Mild diastolic dysfunction.  Mild MR.   XI ROBOTIC ASSISTED LOWER ANTERIOR RESECTION N/A 05/26/2021   Procedure: ROBOTIC LOW ANTERIOR RECTOSIGMOID RESECTION; ASSESSMENT OF TISSUE PERFUSION WITH FIREFLY;  Surgeon: Karie Soda, MD;  Location: [facility] ORS;  Service: General;  Laterality: N/A;    Review of systems negative except as noted in HPI / PMHx or noted below:  Review of Systems  Constitutional: Negative.   HENT: Negative.    Eyes: Negative.   Respiratory: Negative.    Cardiovascular: Negative.   Gastrointestinal: Negative.   Genitourinary: Negative.   Musculoskeletal: Negative.   Skin:  Negative.   Neurological: Negative.   Endo/Heme/Allergies: Negative.   Psychiatric/Behavioral: Negative.  Objective:   Vitals:   08/17/22 1606  BP: 130/86  Pulse: 74  Resp: 16  Temp: 98.3 F (36.8 C)  SpO2: 94%   Height: 5' (152.4 cm)  Weight: 166 lb 11.2 oz (75.6 kg)   Physical Exam Skin:    Findings: No rash (no dermatitis).     Diagnostics: none  Assessment and Plan:   1. Idiopathic urticaria   2. Inflammatory dermatosis   3. Pruritic disorder    1.  Continue Omalizumab 150 mg every 4 weeks  2.  Use a combination of the following every day:   A.  Cetirizine 10 mg - 1 tablet twice a day  B.  Famotidine 20 mg - 1 tablet twice a day  C. Shower, then mometasone 0.1% ointment applied while wet 1-7 times a week  3. Return to clinic in 6 months or earlier if problem  Ozell is about 50% better and I think at this point in time that is the only improvement we are going to be able to provide her on her current plan.  I am not that interested in adding in additional medications at this point.  Will just see how the next 6 months plays out while using anti-IgE monoclonal as well as an H2 and H2 receptor blocker and topical anti-inflammatory agents.  Laurette Schimke, MD Allergy / Immunology [facility]

## 2022-08-17 NOTE — Patient Instructions (Signed)
  1.  Continue Omalizumab 150 mg every 4 weeks  2.  Use a combination of the following every day:   A.  Cetirizine 10 mg - 1 tablet twice a day  B.  Famotidine 20 mg - 1 tablet twice a day  C. Shower, then mometasone 0.1% ointment applied while wet 1-7 times a week  3. Return to clinic in 6 months or earlier if problem

## 2022-08-18 ENCOUNTER — Encounter: Payer: Self-pay | Admitting: Allergy and Immunology

## 2022-08-19 DIAGNOSIS — I5032 Chronic diastolic (congestive) heart failure: Secondary | ICD-10-CM | POA: Diagnosis not present

## 2022-08-20 ENCOUNTER — Ambulatory Visit: Payer: PPO | Admitting: Cardiology

## 2022-08-21 ENCOUNTER — Other Ambulatory Visit: Payer: Self-pay | Admitting: Family Medicine

## 2022-08-21 DIAGNOSIS — F418 Other specified anxiety disorders: Secondary | ICD-10-CM

## 2022-08-23 ENCOUNTER — Encounter: Payer: Self-pay | Admitting: Family Medicine

## 2022-08-23 DIAGNOSIS — M11211 Other chondrocalcinosis, right shoulder: Secondary | ICD-10-CM | POA: Diagnosis not present

## 2022-08-23 NOTE — Telephone Encounter (Signed)
Name of Medication: Clonazepam Name of Pharmacy: [facility] Last Fill or Written Date and Quantity: 07/27/22, #30 Last Office Visit and Type: 08/13/22, AWV Next Office Visit and Type: 12/10/22, 4 mo f/u Last Controlled Substance Agreement Date: 09/26/14 Last UDS: 09/26/14

## 2022-08-24 ENCOUNTER — Telehealth: Payer: Self-pay | Admitting: Family Medicine

## 2022-08-24 NOTE — Telephone Encounter (Signed)
ERx 

## 2022-08-24 NOTE — Telephone Encounter (Signed)
Spoke to patient by telephone and was advised that her daughter sent a mychart message yesterday with her blood pressure reading. Patient stated that her blood pressure is still running high. Patient stated that she does not want to see the cardiologist that she has been seeing and wants a second opinion. Patient stated that someone told her about a female cardiologist by the name of Dr. Duke Salvia and would like a referral to her or someone else that Dr. Sharen Hones would recommend.  Patient stated that she got a call today from someone telling her that she needs to have a bone density test and she is not interested in having that test done at her age. Patient stated that she is not sure who called her. Patient stated that she is interested in taking the Prolia shots.Marland Kitchen

## 2022-08-24 NOTE — Telephone Encounter (Signed)
Pt called in requesting a call back stated she has question and concern about BP and if she should start the prolia infections . Please advise [phone]

## 2022-08-25 ENCOUNTER — Telehealth: Payer: Self-pay | Admitting: Cardiology

## 2022-08-25 ENCOUNTER — Encounter: Payer: Self-pay | Admitting: Family Medicine

## 2022-08-25 ENCOUNTER — Ambulatory Visit (INDEPENDENT_AMBULATORY_CARE_PROVIDER_SITE_OTHER): Payer: PPO | Admitting: Family Medicine

## 2022-08-25 ENCOUNTER — Telehealth: Payer: Self-pay

## 2022-08-25 ENCOUNTER — Other Ambulatory Visit: Payer: Self-pay | Admitting: Family Medicine

## 2022-08-25 VITALS — BP 148/76 | HR 52 | Temp 97.3°F | Ht 60.0 in | Wt 166.5 lb

## 2022-08-25 DIAGNOSIS — I10 Essential (primary) hypertension: Secondary | ICD-10-CM

## 2022-08-25 DIAGNOSIS — Z1231 Encounter for screening mammogram for malignant neoplasm of breast: Secondary | ICD-10-CM

## 2022-08-25 DIAGNOSIS — M810 Age-related osteoporosis without current pathological fracture: Secondary | ICD-10-CM

## 2022-08-25 MED ORDER — CARVEDILOL 3.125 MG PO TABS: 3.1250 mg | ORAL_TABLET | Freq: Two times a day (BID) | ORAL | 6 refills | Status: DC

## 2022-08-25 MED ORDER — AMLODIPINE BESYLATE 2.5 MG PO TABS: 2.5000 mg | ORAL_TABLET | Freq: Every day | ORAL | 0 refills | Status: DC | PRN

## 2022-08-25 NOTE — Telephone Encounter (Signed)
Rec'd a referral today, called pt and she stated she would like to do a provider Switch  From: Brooke Beasley  To: Duke Salvia

## 2022-08-25 NOTE — Telephone Encounter (Signed)
Patient called today stating that she needed a refill on Pepcid because she did not receive it the last time she went to the pharmacy. Advised her that the prescription was sent into pharmacy [facility]) on 08/17/22. Patient stated that she will call the pharmacy and find out why they did not give it to her last time.

## 2022-08-25 NOTE — Assessment & Plan Note (Signed)
Chronic, fluctuating, difficult to control, readings yesterday ranged from 90-170s systolic. She is tolerating current regimen well - lasix, spironolactone, hydralazine 100mg  TID. Will continue carvedilol 3.125mg  BID, limit titration due to colchicine interaction and bradycardia (she's also on timolol eye drops).  Will add amlodipine 2.5mg  QD PRN BP >160/100.  Will refer to Dr Duke Salvia per pt request for further evaluation of brittle BP in h/o several antihypertensive intolerances.

## 2022-08-25 NOTE — Telephone Encounter (Signed)
Will discuss at OV today.

## 2022-08-25 NOTE — Assessment & Plan Note (Addendum)
Declines rpt DEXA She desires to restart Prolia. She continues vit D 1000 IU daily.  Recent CrCl = 40.  Will price out and notify her.

## 2022-08-25 NOTE — Patient Instructions (Addendum)
Continue lasix, spironolactone, hydralazine 100mg  three times daily, continue carvedilol 3.125mg  twice daily.  May take amlodipine 2.5mg  daily as needed for blood pressures >160/100.  We will refer you to Dr Duke Salvia for further evaluation of fluctuating blood pressures despite above regimen.  Good to see you today, keep [place] updated with blood pressure readings in the meantime.

## 2022-08-25 NOTE — Telephone Encounter (Signed)
Will discuss at OV today. She declined rpt DEXA scan. Can we price out prolia for patient?  Lab Results  Component Value Date   CREATININE 1.14 08/06/2022   BUN 23 08/06/2022   NA 137 08/06/2022   K 4.2 08/06/2022   CL 96 08/06/2022   CO2 35 (H) 08/06/2022   GFR = 42.6 CrCl = 40

## 2022-08-25 NOTE — Progress Notes (Signed)
Ph: [phone]       Fax: [phone]   Patient ID: Brooke Beasley, female    DOB: August 12, 1930, 89 y.o.   MRN: [id number]  This visit was conducted in person.  BP (!) 148/76 (BP Location: Right Arm, Cuff Size: Normal)   Pulse (!) 52   Temp (!) 97.3 F (36.3 C) (Temporal)   Ht 5' (1.524 m)   Wt 166 lb 8 oz (75.5 kg)   SpO2 94%   BMI 32.52 kg/m   BP Readings from Last 3 Encounters:  08/25/22 (!) 148/76  08/17/22 130/86  08/13/22 (!) 182/100   CC: hypertension Subjective:   HPI: Brooke Beasley is a 89 y.o. female presenting on 08/25/2022 for Medical Management of Chronic Issues (Wants to discuss BP going up and down and BP meds. Also, wants 2nd opinion on info from cards and wants to discuss BMD. Pt accompanied by daughter, [name].)   HTN - Compliant with current antihypertensive regimen of lasix 20mg  daily, hydralazine 100mg  TID, spironolactone 25mg  daily. Last visit we restarted carvedilol 3.125mg  nightly, she increased to 6.25mg  nightly then further to 6.25mg  BID. Does check blood pressures at home: 162/84, 179/101/HR 53, 159/72/HR 53. Yesterday BP ranged 98-174/58-99, HR 66-80. No low blood pressure readings or symptoms of dizziness/syncope. Denies HA, vision changes, CP/tightness, SOB, leg swelling.   Previously antihypertensive intolerances: Lisinopril stopped due to cough.  Amlodipine stopped due to abdominal pains.  Carvedilol stopped due to fatigue.   Pseudogout - she is on daily colchicine 0.6mg . Noted colchicine/carvedilol drug interaction. She denies diarrhea, abd pain.   OP - DEXA 07/2020 - T -4.3 at forearm, -1.3 at L femoral neck  Last prolia shot 10/2019.  Desires to restart prolia. Will price out.  DEXA canceled - she declined.   She has seen Dr Azucena Cecil for HTN, mild aortic stenosis, G2DD. She would like to see new cardiologist in [facility] - requests Dr Duke Salvia.  She has seen Dr Lalla Brothers for atrial fibrillation and flutter s/p DC  cardioversion 10/2021, on eliquis 5mg  BID. F/u left PRN.  Started taking blueberry tablets (blueberry extract with black pepper fruit extract)     Relevant past medical, surgical, family and social history reviewed and updated as indicated. Interim medical history since our last visit reviewed. Allergies and medications reviewed and updated. Outpatient Medications Prior to Visit  Medication Sig Dispense Refill   apixaban (ELIQUIS) 5 MG TABS tablet Take 1 tablet (5 mg total) by mouth 2 (two) times daily. 60 tablet 11   Ascorbic Acid (VITAMIN C PO) Take by mouth.     bimatoprost (LUMIGAN) 0.01 % SOLN Place 1 drop into both eyes at bedtime.     cetirizine (ZYRTEC) 10 MG tablet Take 1 tablet (10 mg total) by mouth 2 (two) times daily. 180 tablet 1   cholecalciferol (VITAMIN D) 1000 units tablet Take 1,000 Units by mouth daily.     clobetasol cream (TEMOVATE) 0.05 % Apply 1 Application topically 2 (two) times daily. 30 g 0   clonazePAM (KLONOPIN) 0.5 MG tablet TAKE 1 TABLET BY MOUTH AT BEDTIME 30 tablet 0   colchicine 0.6 MG tablet Take 1 tablet (0.6 mg total) by mouth daily. 90 tablet 1   DULoxetine (CYMBALTA) 20 MG capsule Take 1 capsule (20 mg total) by mouth daily. 90 capsule 4   EPINEPHrine (EPIPEN 2-PAK) 0.3 mg/0.3 mL IJ SOAJ injection Inject 0.3 mg into the muscle as needed for anaphylaxis. XOLAIR INJECTION PROTOCOL 2 each 2  famotidine (PEPCID) 20 MG tablet Take 1 tablet (20 mg total) by mouth 2 (two) times daily. 180 tablet 1   Ferrous Sulfate (IRON) 28 MG TABS Take 1 tablet (28 mg total) by mouth every other day.     furosemide (LASIX) 20 MG tablet Take 1 tablet (20 mg total) by mouth daily. 90 tablet 4   gabapentin (NEURONTIN) 300 MG capsule TAKE 1 CAPSULE BY MOUTH ONCE DAILY AS NEEDED FOR  NERVE  PAIN  (ITCHING) 30 capsule 3   hydrALAZINE (APRESOLINE) 100 MG tablet Take 1 tablet (100 mg total) by mouth 3 (three) times daily.     ipratropium (ATROVENT) 0.06 % nasal spray Place 2 sprays  into both nostrils 4 (four) times daily. 15 mL 5   levothyroxine (SYNTHROID) 100 MCG tablet Take 1 tablet (100 mcg total) by mouth daily before breakfast. 90 tablet 4   loperamide (IMODIUM) 2 MG capsule Take 1 capsule (2 mg total) by mouth 3 (three) times daily as needed for diarrhea or loose stools. 30 capsule 0   mometasone (ELOCON) 0.1 % ointment Apply topically daily. Shower than ointment applied while still wet daily 135 g 1   Multiple Vitamin (MULTIVITAMIN ADULT PO) Take by mouth daily.     polyethylene glycol (MIRALAX / GLYCOLAX) 17 g packet Take 17 g by mouth daily.     Potassium 99 MG TABS Take 1 tablet (99 mg total) by mouth every Monday, Wednesday, and Friday. 330 tablet    Simethicone 180 MG CAPS Take 1 capsule by mouth daily as needed (indigestion).     spironolactone (ALDACTONE) 25 MG tablet Take 1 tablet (25 mg total) by mouth daily. 90 tablet 4   timolol (TIMOPTIC) 0.5 % ophthalmic solution Place 1 drop into both eyes every morning.     carvedilol (COREG) 3.125 MG tablet Take 1 tablet (3.125 mg total) by mouth at bedtime. 30 tablet 3   Facility-Administered Medications Prior to Visit  Medication Dose Route Frequency Provider Last Rate Last Admin   omalizumab Geoffry Paradise) prefilled syringe 150 mg  150 mg Subcutaneous Q28 days Kozlow, Alvira Philips, MD   150 mg at 08/04/22 0946     Per HPI unless specifically indicated in ROS section below Review of Systems  Objective:  BP (!) 148/76 (BP Location: Right Arm, Cuff Size: Normal)   Pulse (!) 52   Temp (!) 97.3 F (36.3 C) (Temporal)   Ht 5' (1.524 m)   Wt 166 lb 8 oz (75.5 kg)   SpO2 94%   BMI 32.52 kg/m   Wt Readings from Last 3 Encounters:  08/25/22 166 lb 8 oz (75.5 kg)  08/17/22 166 lb 11.2 oz (75.6 kg)  08/13/22 166 lb (75.3 kg)      Physical Exam Vitals and nursing note reviewed.  Constitutional:      Appearance: Normal appearance. She is not ill-appearing.  HENT:     Head: Normocephalic and atraumatic.      Mouth/Throat:     Mouth: Mucous membranes are moist.     Pharynx: Oropharynx is clear. No oropharyngeal exudate or posterior oropharyngeal erythema.  Eyes:     Extraocular Movements: Extraocular movements intact.     Pupils: Pupils are equal, round, and reactive to light.  Cardiovascular:     Rate and Rhythm: Normal rate. Rhythm irregularly irregular.     Pulses: Normal pulses.     Heart sounds: Murmur (3/6 systolic to USB) heard.     Systolic murmur is present.  Pulmonary:  Effort: Pulmonary effort is normal. No respiratory distress.     Breath sounds: Normal breath sounds. No wheezing, rhonchi or rales.  Musculoskeletal:     Right lower leg: No edema.     Left lower leg: No edema.  Skin:    General: Skin is warm and dry.     Findings: No rash.  Neurological:     Mental Status: She is alert.  Psychiatric:        Mood and Affect: Mood normal.        Behavior: Behavior normal.       Results for orders placed or performed in visit on 08/06/22  IBC panel  Result Value Ref Range   Iron 100 42 - 145 ug/dL   Transferrin 409.8 119.1 - 360.0 mg/dL   Saturation Ratios 47.8 20.0 - 50.0 %   TIBC 359.8 250.0 - 450.0 mcg/dL  Ferritin  Result Value Ref Range   Ferritin 102.9 10.0 - 291.0 ng/mL  VITAMIN D 25 Hydroxy (Vit-D Deficiency, Fractures)  Result Value Ref Range   VITD 39.60 30.00 - 100.00 ng/mL  Microalbumin / creatinine urine ratio  Result Value Ref Range   Microalb, Ur 1.8 0.0 - 1.9 mg/dL   Creatinine,U 29.5 mg/dL   Microalb Creat Ratio 2.0 0.0 - 30.0 mg/g  Comprehensive metabolic panel  Result Value Ref Range   Sodium 137 135 - 145 mEq/L   Potassium 4.2 3.5 - 5.1 mEq/L   Chloride 96 96 - 112 mEq/L   CO2 35 (H) 19 - 32 mEq/L   Glucose, Bld 101 (H) 70 - 99 mg/dL   BUN 23 6 - 23 mg/dL   Creatinine, Ser 6.21 0.40 - 1.20 mg/dL   Total Bilirubin 0.8 0.2 - 1.2 mg/dL   Alkaline Phosphatase 102 39 - 117 U/L   AST 28 0 - 37 U/L   ALT 25 0 - 35 U/L   Total Protein 6.6  6.0 - 8.3 g/dL   Albumin 4.2 3.5 - 5.2 g/dL   GFR 30.86 (L) >57.84 mL/min   Calcium 9.6 8.4 - 10.5 mg/dL  Lipid panel  Result Value Ref Range   Cholesterol 181 0 - 200 mg/dL   Triglycerides 69.6 0.0 - 149.0 mg/dL   HDL 29.52 >84.13 mg/dL   VLDL 24.4 0.0 - 01.0 mg/dL   LDL Cholesterol 272 (H) 0 - 99 mg/dL   Total CHOL/HDL Ratio 3    NonHDL 116.66   CBC with Differential/Platelet  Result Value Ref Range   WBC 4.6 4.0 - 10.5 K/uL   RBC 4.93 3.87 - 5.11 Mil/uL   Hemoglobin 15.6 (H) 12.0 - 15.0 g/dL   HCT 53.6 (H) 64.4 - 03.4 %   MCV 96.9 78.0 - 100.0 fl   MCHC 32.8 30.0 - 36.0 g/dL   RDW 74.2 59.5 - 63.8 %   Platelets 240.0 150.0 - 400.0 K/uL   Neutrophils Relative % 39.9 (L) 43.0 - 77.0 %   Lymphocytes Relative 40.0 12.0 - 46.0 %   Monocytes Relative 15.2 (H) 3.0 - 12.0 %   Eosinophils Relative 4.5 0.0 - 5.0 %   Basophils Relative 0.4 0.0 - 3.0 %   Neutro Abs 1.9 1.4 - 7.7 K/uL   Lymphs Abs 1.9 0.7 - 4.0 K/uL   Monocytes Absolute 0.7 0.1 - 1.0 K/uL   Eosinophils Absolute 0.2 0.0 - 0.7 K/uL   Basophils Absolute 0.0 0.0 - 0.1 K/uL  Hemoglobin A1c  Result Value Ref Range   Hgb A1c MFr  Bld 5.7 4.6 - 6.5 %   Lab Results  Component Value Date   TSH 1.75 06/16/2022   Echo TTE 07/2021 EF 55 to 60%, moderately dilated LA, moderate MR, posterior mitral leaflet prolapse   Assessment & Plan:   Problem List Items Addressed This Visit     Hypertension, essential - Primary    Chronic, fluctuating, difficult to control, readings yesterday ranged from 90-170s systolic. She is tolerating current regimen well - lasix, spironolactone, hydralazine 100mg  TID. Will continue carvedilol 3.125mg  BID, limit titration due to colchicine interaction and bradycardia (she's also on timolol eye drops).  Will add amlodipine 2.5mg  QD PRN BP >160/100.  Will refer to Dr Duke Salvia per pt request for further evaluation of brittle BP in h/o several antihypertensive intolerances.       Relevant Medications    carvedilol (COREG) 3.125 MG tablet   amLODipine (NORVASC) 2.5 MG tablet   Other Relevant Orders   Ambulatory referral to Cardiology   Osteoporosis    Declines rpt DEXA She desires to restart Prolia. She continues vit D 1000 IU daily.  Recent CrCl = 40.  Will price out and notify her.        Meds ordered this encounter  Medications   carvedilol (COREG) 3.125 MG tablet    Sig: Take 1 tablet (3.125 mg total) by mouth 2 (two) times daily with a meal.    Dispense:  60 tablet    Refill:  6   amLODipine (NORVASC) 2.5 MG tablet    Sig: Take 1 tablet (2.5 mg total) by mouth daily as needed (BP >160/100).    Dispense:  30 tablet    Refill:  0    Orders Placed This Encounter  Procedures   Ambulatory referral to Cardiology    Referral Priority:   Routine    Referral Type:   Consultation    Referral Reason:   Specialty Services Required    Number of Visits Requested:   1    Patient Instructions  Continue lasix, spironolactone, hydralazine 100mg  three times daily, continue carvedilol 3.125mg  twice daily.  May take amlodipine 2.5mg  daily as needed for blood pressures >160/100.  We will refer you to Dr Duke Salvia for further evaluation of fluctuating blood pressures despite above regimen.  Good to see you today, keep [place] updated with blood pressure readings in the meantime.  Follow up plan: Return if symptoms worsen or fail to improve.  Eustaquio Boyden, MD

## 2022-08-26 NOTE — Telephone Encounter (Signed)
Please start auth for prolia. Patient has not had in over a year.

## 2022-09-01 ENCOUNTER — Ambulatory Visit (INDEPENDENT_AMBULATORY_CARE_PROVIDER_SITE_OTHER): Payer: PPO

## 2022-09-01 DIAGNOSIS — L501 Idiopathic urticaria: Secondary | ICD-10-CM

## 2022-09-06 ENCOUNTER — Other Ambulatory Visit (HOSPITAL_COMMUNITY): Payer: Self-pay

## 2022-09-06 ENCOUNTER — Telehealth: Payer: Self-pay

## 2022-09-06 NOTE — Telephone Encounter (Signed)
   20% coinsurance, no deductible, no prior auth. needed 

## 2022-09-06 NOTE — Telephone Encounter (Signed)
Created new encounter for Prolia BIV. Routed encounter to CMA

## 2022-09-06 NOTE — Telephone Encounter (Signed)
Prolia VOB initiated via [email or web address] 

## 2022-09-06 NOTE — Telephone Encounter (Signed)
Pt ready for scheduling for Prolia on or after : 09/06/22  Out-of-pocket cost due at time of visit: $302  Primary: [employer] Prolia co-insurance: 20% Admin fee co-insurance: 0%  Secondary: --- Prolia co-insurance:  Admin fee co-insurance:   Medical Benefit Details: Date Benefits were checked: 09/06/22 Deductible: NO/ Coinsurance: 20%/ Admin Fee: 0%  Prior Auth: N/A PA# Expiration Date:    Pharmacy benefit: Copay $477.10 If patient wants fill through the pharmacy benefit please send prescription to:  [facility] , and include estimated need by date in rx notes. Pharmacy will ship medication directly to the office.  Patient NOT eligible for Prolia Copay Card. Copay Card can make patient's cost as little as $25. Link to apply: [email or web address]://[email or web address]  ** This summary of benefits is an estimation of the patient's out-of-pocket cost. Exact cost may very based on individual plan coverage.

## 2022-09-10 ENCOUNTER — Encounter: Payer: Self-pay | Admitting: Family Medicine

## 2022-09-10 ENCOUNTER — Other Ambulatory Visit: Payer: Self-pay | Admitting: Family Medicine

## 2022-09-13 ENCOUNTER — Other Ambulatory Visit (INDEPENDENT_AMBULATORY_CARE_PROVIDER_SITE_OTHER): Payer: PPO

## 2022-09-13 DIAGNOSIS — D751 Secondary polycythemia: Secondary | ICD-10-CM

## 2022-09-13 LAB — CBC WITH DIFFERENTIAL/PLATELET
Basophils Absolute: 0 10*3/uL (ref 0.0–0.1)
Basophils Relative: 0.6 % (ref 0.0–3.0)
Eosinophils Absolute: 0.2 10*3/uL (ref 0.0–0.7)
Eosinophils Relative: 3.5 % (ref 0.0–5.0)
HCT: 43 % (ref 36.0–46.0)
Hemoglobin: 14.6 g/dL (ref 12.0–15.0)
Lymphocytes Relative: 31.5 % (ref 12.0–46.0)
Lymphs Abs: 1.5 10*3/uL (ref 0.7–4.0)
MCHC: 34 g/dL (ref 30.0–36.0)
MCV: 94.7 fl (ref 78.0–100.0)
Monocytes Absolute: 0.6 10*3/uL (ref 0.1–1.0)
Monocytes Relative: 13.6 % — ABNORMAL HIGH (ref 3.0–12.0)
Neutro Abs: 2.4 10*3/uL (ref 1.4–7.7)
Neutrophils Relative %: 50.8 % (ref 43.0–77.0)
Platelets: 226 10*3/uL (ref 150.0–400.0)
RBC: 4.54 Mil/uL (ref 3.87–5.11)
RDW: 13.5 % (ref 11.5–15.5)
WBC: 4.8 10*3/uL (ref 4.0–10.5)

## 2022-09-13 LAB — SEDIMENTATION RATE: Sed Rate: 25 mm/hr (ref 0–30)

## 2022-09-13 NOTE — Telephone Encounter (Signed)
Refill request Amlodipine See allergy/contraindication See mychart message Last office visit 08/25/22 Last refill 08/25/22 #30

## 2022-09-13 NOTE — Telephone Encounter (Signed)
ERx 

## 2022-09-14 ENCOUNTER — Other Ambulatory Visit: Payer: Self-pay

## 2022-09-14 DIAGNOSIS — M810 Age-related osteoporosis without current pathological fracture: Secondary | ICD-10-CM

## 2022-09-14 LAB — PATHOLOGIST SMEAR REVIEW

## 2022-09-14 NOTE — Addendum Note (Signed)
Addended by: Alvina Chou on: 09/14/2022 02:58 PM   Modules accepted: Orders

## 2022-09-14 NOTE — Telephone Encounter (Signed)
Called patient reviewed all following information including appointment, Co pay due at time of visit and if pick up of injection is needed from outside pharmacy.     Out of pocket for patient: $302   Lab appointment : 5/22 at 3 pm   Nurse visit:  5/29 11:30  Lab order placed: Yes  Prolia has been  [x]   Ordered  []   Script sent to local pharmacy for patient to bring   []   Script sent to Specialty pharmacy   []   Script sent to [facility] to deliver

## 2022-09-15 ENCOUNTER — Other Ambulatory Visit (INDEPENDENT_AMBULATORY_CARE_PROVIDER_SITE_OTHER): Payer: PPO

## 2022-09-15 DIAGNOSIS — M810 Age-related osteoporosis without current pathological fracture: Secondary | ICD-10-CM | POA: Diagnosis not present

## 2022-09-16 ENCOUNTER — Encounter: Payer: Self-pay | Admitting: Family Medicine

## 2022-09-16 LAB — BASIC METABOLIC PANEL
BUN: 19 mg/dL (ref 6–23)
CO2: 33 mEq/L — ABNORMAL HIGH (ref 19–32)
Calcium: 9.8 mg/dL (ref 8.4–10.5)
Chloride: 95 mEq/L — ABNORMAL LOW (ref 96–112)
Creatinine, Ser: 1.09 mg/dL (ref 0.40–1.20)
GFR: 44.94 mL/min — ABNORMAL LOW (ref >60.00)
Glucose, Bld: 95 mg/dL (ref 70–99)
Potassium: 4.4 mEq/L (ref 3.5–5.1)
Sodium: 135 mEq/L (ref 135–145)

## 2022-09-17 ENCOUNTER — Telehealth: Payer: Self-pay | Admitting: Family Medicine

## 2022-09-17 MED ORDER — HYDRALAZINE HCL 100 MG PO TABS: 100.0000 mg | ORAL_TABLET | Freq: Three times a day (TID) | ORAL | 0 refills | Status: DC

## 2022-09-17 NOTE — Telephone Encounter (Signed)
I've sent in 1 mo supply of hydralazine 100mg  TID to [facility] pharmacy.

## 2022-09-17 NOTE — Telephone Encounter (Signed)
Spoke with pt/pt's daughter, [name], relaying Dr. Timoteo Expose message. They both express their thanks.

## 2022-09-17 NOTE — Telephone Encounter (Signed)
Chart shows rx sent 09/13/22, #90/1 to [facility]  Spoke with [facility] asking if they received rx. States the did, however, pt picked up refill on 08/25/22 so, per ins co, it's too early for refill. Says they will refill it when pt is due.   Spoke with pt/pt's daughter, relaying message from pharmacy. They verbalize understanding.   Also, I notified them pt's chart shows hydralazine is prescribed by [facility]. Per pt's daughter, [name], pt has not seen them in awhile so they won't fill it. However, pt has appt on 11/11/22 for 2nd opinion with cares at [facility]. [name] is asking if Dr. Reece Agar can refill med until that appt.

## 2022-09-17 NOTE — Telephone Encounter (Signed)
Prescription Request  09/17/2022  LOV: 08/25/2022  What is the name of the medication or equipment? amLODipine (NORVASC) 2.5 MG tablet hydrALAZINE (APRESOLINE) 100 MG tablet   Have you contacted your pharmacy to request a refill? No   Which pharmacy would you like this sent to?  [facility] [address] [place] - [address] [place] [address] Phone: [phone] Fax: [phone]   Patient notified that their request is being sent to the clinical staff for review and that they should receive a response within 2 business days.   Please advise at Mobile [phone] (mobile)  Patient is out of this medication, please advise, thank you.

## 2022-09-17 NOTE — Addendum Note (Signed)
Addended by: Eustaquio Boyden on: 09/17/2022 04:23 PM   Modules accepted: Orders

## 2022-09-18 ENCOUNTER — Other Ambulatory Visit: Payer: Self-pay | Admitting: Family Medicine

## 2022-09-18 DIAGNOSIS — F418 Other specified anxiety disorders: Secondary | ICD-10-CM

## 2022-09-18 DIAGNOSIS — I5032 Chronic diastolic (congestive) heart failure: Secondary | ICD-10-CM | POA: Diagnosis not present

## 2022-09-21 NOTE — Telephone Encounter (Signed)
ERx 

## 2022-09-21 NOTE — Telephone Encounter (Signed)
Name of Medication: Clonazepam Name of Pharmacy: [facility] Last Fill or Written Date and Quantity: 08/25/22, #30 Last Office Visit and Type: 08/25/22, HTN f/u Next Office Visit and Type: 12/10/22, 4 mo f/u Last Controlled Substance Agreement Date: 09/26/14 Last UDS: 09/26/14

## 2022-09-23 DIAGNOSIS — G4733 Obstructive sleep apnea (adult) (pediatric): Secondary | ICD-10-CM | POA: Diagnosis not present

## 2022-09-28 ENCOUNTER — Ambulatory Visit (INDEPENDENT_AMBULATORY_CARE_PROVIDER_SITE_OTHER): Payer: PPO

## 2022-09-28 DIAGNOSIS — M810 Age-related osteoporosis without current pathological fracture: Secondary | ICD-10-CM

## 2022-09-28 MED ORDER — DENOSUMAB 60 MG/ML ~~LOC~~ SOSY
60.0000 mg | PREFILLED_SYRINGE | Freq: Once | SUBCUTANEOUS | Status: AC
Start: 2022-09-28 — End: 2022-09-28
  Administered 2022-09-28: 60 mg via SUBCUTANEOUS

## 2022-09-28 NOTE — Progress Notes (Signed)
Per orders of Dr. Eustaquio Boyden, injection of Prolia 60 mg given by Lewanda Rife in right arm Patient tolerated injection well. Since this is first prolia injection in years pt waited 15 mins without problem or symptoms. Pt left with daughter Marylu Lund.Patient will make appointment for 6 months.

## 2022-09-29 ENCOUNTER — Ambulatory Visit: Payer: PPO

## 2022-10-06 ENCOUNTER — Encounter: Payer: Self-pay | Admitting: Family Medicine

## 2022-10-06 ENCOUNTER — Ambulatory Visit (INDEPENDENT_AMBULATORY_CARE_PROVIDER_SITE_OTHER): Payer: PPO | Admitting: Family Medicine

## 2022-10-06 ENCOUNTER — Telehealth: Payer: Self-pay | Admitting: Adult Health

## 2022-10-06 ENCOUNTER — Ambulatory Visit (INDEPENDENT_AMBULATORY_CARE_PROVIDER_SITE_OTHER): Payer: PPO

## 2022-10-06 VITALS — BP 138/70 | HR 70 | Temp 97.2°F | Ht 60.0 in | Wt 167.1 lb

## 2022-10-06 DIAGNOSIS — M810 Age-related osteoporosis without current pathological fracture: Secondary | ICD-10-CM

## 2022-10-06 DIAGNOSIS — L501 Idiopathic urticaria: Secondary | ICD-10-CM | POA: Diagnosis not present

## 2022-10-06 DIAGNOSIS — E039 Hypothyroidism, unspecified: Secondary | ICD-10-CM

## 2022-10-06 DIAGNOSIS — T50905A Adverse effect of unspecified drugs, medicaments and biological substances, initial encounter: Secondary | ICD-10-CM | POA: Diagnosis not present

## 2022-10-06 DIAGNOSIS — I10 Essential (primary) hypertension: Secondary | ICD-10-CM | POA: Diagnosis not present

## 2022-10-06 LAB — BASIC METABOLIC PANEL
BUN: 20 mg/dL (ref 6–23)
CO2: 32 mEq/L (ref 19–32)
Calcium: 9.2 mg/dL (ref 8.4–10.5)
Chloride: 98 mEq/L (ref 96–112)
Creatinine, Ser: 1.1 mg/dL (ref 0.40–1.20)
GFR: 44.44 mL/min — ABNORMAL LOW (ref >60.00)
Glucose, Bld: 79 mg/dL (ref 70–99)
Potassium: 4.2 mEq/L (ref 3.5–5.1)
Sodium: 137 mEq/L (ref 135–145)

## 2022-10-06 LAB — TSH: TSH: 4.81 u[IU]/mL (ref 0.35–5.50)

## 2022-10-06 NOTE — Assessment & Plan Note (Signed)
Chronic, multiple drug intolerances, continued fluctuations noted. She is doing better with regular amlodipine use - continue this.  Check BMP.

## 2022-10-06 NOTE — Assessment & Plan Note (Signed)
Possible Prolia drug reaction as per above (facial swelling, throat tightness, leg swelling).  Discussed holding on future Prolia, will need to consider IV reclast bisphosphonate vs other treatment at least temporarily in 03/2023, 6 months after latest injection.

## 2022-10-06 NOTE — Assessment & Plan Note (Signed)
Possible drug reaction to Prolia presenting with facial swelling, throat tightness, foot swelling.  Doubt antihypertensive related (amlodipine or hydralazine) as symptoms are better without stopping medications.

## 2022-10-06 NOTE — Assessment & Plan Note (Signed)
Notes weight increase. Update TSH on levothyroxine daily.

## 2022-10-06 NOTE — Progress Notes (Signed)
Ph: [phone] Fax: [phone]   Patient ID: Brooke Beasley, female    DOB: [birth year], 89 y.o.   MRN: [id number]  This visit was conducted in person.  BP 138/70 Comment: Takes amlodipine daily  Pulse 70   Temp (!) 97.2 F (36.2 C) (Temporal)   Ht 5' (1.524 m)   Wt 167 lb 2 oz (75.8 kg)   SpO2 96%   BMI 32.64 kg/m    CC: bilateral foot swelling and facial swelling Subjective:   HPI: Brooke Beasley is a 89 y.o. female presenting on 10/06/2022 for Foot Swelling (C/o B feet swelling- worse in R, toes and face. Started 10/03/22. Recently had Prolia inj 09/28/22. Pt accompanied by daughter, [name]. )   Sunday noted facial swelling with tightness in throat as well as R>L foot swelling. Notes some numbness to R great toe. All symptoms are largely better. No hives or abd pain, nausea.   HTN - continues carvedilol 3.125mg  bid, lasix 20mg  daily, hydralazine 100mg  TID, spironolactone 25mg  daily, and amlodipine 2.5mg  daily PRN BP >160/100 - she's taking this daily. Still noted fluctuations in BP - last night 175/101 after holding amlodipine for 2 days.   She will establish with Dr Duke Salvia cardiology 10/2022.   Most recently received Prolia injection 09/28/2022. She received immunotherapy Omalizumab injection today at allergist office for urticaria and pruritus.     Component Value Date/Time   NA 135 09/15/2022 1441   NA 136 03/27/2021 0935   K 4.4 09/15/2022 1441   CL 95 (L) 09/15/2022 1441   CO2 33 (H) 09/15/2022 1441   GLUCOSE 95 09/15/2022 1441   BUN 19 09/15/2022 1441   BUN 22 03/27/2021 0935   CREATININE 1.09 09/15/2022 1441   CREATININE 1.05 (H) 09/25/2021 1551   CALCIUM 9.8 09/15/2022 1441   PROT 6.6 08/06/2022 0841   PROT 6.8 03/27/2021 0935   ALBUMIN 4.2 08/06/2022 0841   ALBUMIN 4.3 03/27/2021 0935   AST 28 08/06/2022 0841   ALT 25 08/06/2022 0841   ALKPHOS 102 08/06/2022 0841   BILITOT 0.8 08/06/2022 0841   BILITOT 0.3 03/27/2021 0935   GFR 44.94  (L) 09/15/2022 1441   EGFR 50 (L) 08/26/2021 1545   EGFR 51 (L) 03/27/2021 0935   GFRNONAA 43 (L) 11/13/2021 1255    Lab Results  Component Value Date   CALCIUM 9.8 09/15/2022   PHOS 2.1 (L) 09/10/2021   Previously antihypertensive intolerances: Lisinopril stopped due to cough.  Amlodipine stopped due to abdominal pain.  Carvedilol stopped due to fatigue.      Relevant past medical, surgical, family and social history reviewed and updated as indicated. Interim medical history since our last visit reviewed. Allergies and medications reviewed and updated. Outpatient Medications Prior to Visit  Medication Sig Dispense Refill   apixaban (ELIQUIS) 5 MG TABS tablet Take 1 tablet (5 mg total) by mouth 2 (two) times daily. 60 tablet 11   Ascorbic Acid (VITAMIN C PO) Take by mouth.     bimatoprost (LUMIGAN) 0.01 % SOLN Place 1 drop into both eyes at bedtime.     carvedilol (COREG) 3.125 MG tablet Take 1 tablet (3.125 mg total) by mouth 2 (two) times daily with a meal. 60 tablet 6   cetirizine (ZYRTEC) 10 MG tablet Take 1 tablet (10 mg total) by mouth 2 (two) times daily. 180 tablet 1   cholecalciferol (VITAMIN D) 1000 units tablet Take 1,000 Units by mouth daily.     clobetasol cream (TEMOVATE) 0.05 %  Apply 1 Application topically 2 (two) times daily. 30 g 0   clonazePAM (KLONOPIN) 0.5 MG tablet TAKE 1 TABLET BY MOUTH AT BEDTIME 30 tablet 3   colchicine 0.6 MG tablet Take 1 tablet (0.6 mg total) by mouth daily. 90 tablet 1   DULoxetine (CYMBALTA) 20 MG capsule Take 1 capsule (20 mg total) by mouth daily. 90 capsule 4   EPINEPHrine (EPIPEN 2-PAK) 0.3 mg/0.3 mL IJ SOAJ injection Inject 0.3 mg into the muscle as needed for anaphylaxis. Geoffry Paradise INJECTION PROTOCOL 2 each 2   famotidine (PEPCID) 20 MG tablet Take 1 tablet (20 mg total) by mouth 2 (two) times daily. 180 tablet 1   Ferrous Sulfate (IRON) 28 MG TABS Take 1 tablet (28 mg total) by mouth every other day.     furosemide (LASIX) 20 MG  tablet Take 1 tablet (20 mg total) by mouth daily. 90 tablet 4   gabapentin (NEURONTIN) 300 MG capsule TAKE 1 CAPSULE BY MOUTH ONCE DAILY AS NEEDED FOR  NERVE  PAIN  (ITCHING) 30 capsule 3   hydrALAZINE (APRESOLINE) 100 MG tablet Take 1 tablet (100 mg total) by mouth 3 (three) times daily. 90 tablet 0   ipratropium (ATROVENT) 0.06 % nasal spray Place 2 sprays into both nostrils 4 (four) times daily. 15 mL 5   levothyroxine (SYNTHROID) 100 MCG tablet Take 1 tablet (100 mcg total) by mouth daily before breakfast. 90 tablet 4   loperamide (IMODIUM) 2 MG capsule Take 1 capsule (2 mg total) by mouth 3 (three) times daily as needed for diarrhea or loose stools. 30 capsule 0   mometasone (ELOCON) 0.1 % ointment Apply topically daily. Shower than ointment applied while still wet daily 135 g 1   Multiple Vitamin (MULTIVITAMIN ADULT PO) Take by mouth daily.     polyethylene glycol (MIRALAX / GLYCOLAX) 17 g packet Take 17 g by mouth daily.     Potassium 99 MG TABS Take 1 tablet (99 mg total) by mouth every Monday, Wednesday, and Friday. 330 tablet    Simethicone 180 MG CAPS Take 1 capsule by mouth daily as needed (indigestion).     spironolactone (ALDACTONE) 25 MG tablet Take 1 tablet (25 mg total) by mouth daily. 90 tablet 4   timolol (TIMOPTIC) 0.5 % ophthalmic solution Place 1 drop into both eyes every morning.     amLODipine (NORVASC) 2.5 MG tablet TAKE 1 TABLET BY MOUTH ONCE DAILY AS  NEEDED  FOR  BP GREATER THAN 160/100 90 tablet 1   amLODipine (NORVASC) 2.5 MG tablet Take 1 tablet (2.5 mg total) by mouth daily.     Facility-Administered Medications Prior to Visit  Medication Dose Route Frequency Provider Last Rate Last Admin   omalizumab Geoffry Paradise) prefilled syringe 150 mg  150 mg Subcutaneous Q28 days Kozlow, Alvira Philips, MD   150 mg at 10/06/22 9562     Per HPI unless specifically indicated in ROS section below Review of Systems  Objective:  BP 138/70 Comment: Takes amlodipine daily  Pulse 70    Temp (!) 97.2 F (36.2 C) (Temporal)   Ht 5' (1.524 m)   Wt 167 lb 2 oz (75.8 kg)   SpO2 96%   BMI 32.64 kg/m   Wt Readings from Last 3 Encounters:  10/06/22 167 lb 2 oz (75.8 kg)  08/25/22 166 lb 8 oz (75.5 kg)  08/17/22 166 lb 11.2 oz (75.6 kg)      Physical Exam Vitals and nursing note reviewed.  Constitutional:  Appearance: Normal appearance. She is not ill-appearing.  HENT:     Head:     Comments: No facial swelling Cardiovascular:     Rate and Rhythm: Normal rate and regular rhythm.     Pulses: Normal pulses.     Heart sounds: Murmur (3/6 systolic) heard.  Pulmonary:     Effort: Pulmonary effort is normal. No respiratory distress.     Breath sounds: Normal breath sounds. No wheezing, rhonchi or rales.  Musculoskeletal:     Right lower leg: No edema.     Left lower leg: No edema.  Skin:    General: Skin is warm and dry.     Findings: No rash.  Neurological:     Mental Status: She is alert.  Psychiatric:        Mood and Affect: Mood normal.        Behavior: Behavior normal.       Results for orders placed or performed in visit on 09/15/22  Basic Metabolic Panel  Result Value Ref Range   Sodium 135 135 - 145 mEq/L   Potassium 4.4 3.5 - 5.1 mEq/L   Chloride 95 (L) 96 - 112 mEq/L   CO2 33 (H) 19 - 32 mEq/L   Glucose, Bld 95 70 - 99 mg/dL   BUN 19 6 - 23 mg/dL   Creatinine, Ser 1.61 0.40 - 1.20 mg/dL   GFR 09.60 (L) >45.40 mL/min   Calcium 9.8 8.4 - 10.5 mg/dL   Lab Results  Component Value Date   TSH 1.75 06/16/2022    Assessment & Plan:   Problem List Items Addressed This Visit     Hypothyroidism - Primary    Notes weight increase. Update TSH on levothyroxine daily.       Relevant Orders   TSH   Basic metabolic panel   Hypertension, essential    Chronic, multiple drug intolerances, continued fluctuations noted. She is doing better with regular amlodipine use - continue this.  Check BMP.       Relevant Medications   amLODipine  (NORVASC) 2.5 MG tablet   Osteoporosis    Possible Prolia drug reaction as per above (facial swelling, throat tightness, leg swelling).  Discussed holding on future Prolia, will need to consider IV reclast bisphosphonate vs other treatment at least temporarily in 03/2023, 6 months after latest injection.       Drug reaction    Possible drug reaction to Prolia presenting with facial swelling, throat tightness, foot swelling.  Doubt antihypertensive related (amlodipine or hydralazine) as symptoms are better without stopping medications.         No orders of the defined types were placed in this encounter.   Orders Placed This Encounter  Procedures   TSH   Basic metabolic panel    Patient Instructions  Prolia may have caused reaction. We will avoid prolia in the future and need to consider different bone strengthening medicine at least temporarily for December.  Labs today.  Good to see you today.   Follow up plan: No follow-ups on file.  Eustaquio Boyden, MD

## 2022-10-06 NOTE — Patient Instructions (Addendum)
Prolia may have caused reaction. We will avoid prolia in the future and need to consider different bone strengthening medicine at least temporarily for December.  Labs today.  Good to see you today.

## 2022-10-06 NOTE — Telephone Encounter (Signed)
Needs her o2 and supplies authorized so insurance will continue to pay. She is scheduled on 9/3

## 2022-10-07 NOTE — Telephone Encounter (Signed)
I spoke with the patient. I told her I would reach out to [employer] to find out what needs to be done for her insurance to approve her O2 and supplies because her appt is not until 12/28/2022.

## 2022-10-08 ENCOUNTER — Ambulatory Visit
Admission: RE | Admit: 2022-10-08 | Discharge: 2022-10-08 | Disposition: A | Discharge status: Home / Self Care | Payer: PPO | Source: Ambulatory Visit | Attending: Family Medicine | Admitting: Family Medicine

## 2022-10-08 DIAGNOSIS — Z1231 Encounter for screening mammogram for malignant neoplasm of breast: Secondary | ICD-10-CM | POA: Diagnosis not present

## 2022-10-11 NOTE — Telephone Encounter (Signed)
I have spoke with [employer]. They said they have faxed the O2 order to the patient's PCP office and have not received anything back. They asked for me to fax over the last office note. They will send an O2 order for Brooke [place]aks, NP to sign.  I have notified the patient.  Nothing further needed.

## 2022-10-18 ENCOUNTER — Encounter (HOSPITAL_BASED_OUTPATIENT_CLINIC_OR_DEPARTMENT_OTHER): Payer: Self-pay | Admitting: Cardiovascular Disease

## 2022-10-18 ENCOUNTER — Ambulatory Visit (INDEPENDENT_AMBULATORY_CARE_PROVIDER_SITE_OTHER): Payer: PPO | Admitting: Cardiovascular Disease

## 2022-10-18 VITALS — BP 154/80 | HR 75 | Ht 60.0 in | Wt 170.0 lb

## 2022-10-18 DIAGNOSIS — R0602 Shortness of breath: Secondary | ICD-10-CM

## 2022-10-18 DIAGNOSIS — I6529 Occlusion and stenosis of unspecified carotid artery: Secondary | ICD-10-CM

## 2022-10-18 DIAGNOSIS — I701 Atherosclerosis of renal artery: Secondary | ICD-10-CM | POA: Diagnosis not present

## 2022-10-18 DIAGNOSIS — I4819 Other persistent atrial fibrillation: Secondary | ICD-10-CM

## 2022-10-18 DIAGNOSIS — I10 Essential (primary) hypertension: Secondary | ICD-10-CM

## 2022-10-18 DIAGNOSIS — I491 Atrial premature depolarization: Secondary | ICD-10-CM

## 2022-10-18 MED ORDER — AMLODIPINE BESYLATE 2.5 MG PO TABS: 2.5000 mg | ORAL_TABLET | Freq: Two times a day (BID) | ORAL | 3 refills | Status: DC

## 2022-10-18 MED ORDER — HYDRALAZINE HCL 100 MG PO TABS: 100.0000 mg | ORAL_TABLET | Freq: Three times a day (TID) | ORAL | 3 refills | Status: DC

## 2022-10-18 NOTE — Progress Notes (Signed)
Advanced Hypertension Clinic Follow Up:    Date:  10/18/2022   ID:  Brooke Beasley, DOB 28-Apr-1930, MRN [id number]  PCP:  Eustaquio Boyden, MD  Cardiologist:  None  Nephrologist:  Referring MD: Eustaquio Boyden, MD   CC: Hypertension  History of Present Illness:    Brooke Beasley is a 89 y.o. female with a hx of persistent atrial fibrillation/flutter status post cardioversion, HFpEF, hypertension, hyperlipidemia, CKD 3, mild aortic stenosis, and anxiety here to establish care in the Advanced Hypertension Clinic.  She has a history of a history of atrial fibrillation.  Ventricular rates were slow so carvedilol was reduced to 3.125 mg twice daily.  Blood pressure was elevated when she saw Dr. Bethena Roys on 06/2022 but had generally been controlled.  Brooke Beasley presents with concerns about fluctuating blood pressure readings described as a "roller coaster." The patient reports that these fluctuations have been occurring for approximately one year, with blood pressure readings ranging from 90s to 200s. The patient's blood pressure was previously well-controlled, but has become increasingly difficult to manage since a rectal prolapse surgery last year, during which a foot of intestines was removed. Post-surgery, the patient was admitted to the ICU due to blood pressure issues and subsequently developed an infection requiring antibiotic treatment via a PICC line.  The patient also reports recent episodes of shortness of breath, particularly when walking around the yard, which has worsened over the past three days. The patient denies any significant leg swelling recently, but notes that the left leg is always slightly larger. The patient also reports occasional chest discomfort described as a "fluttering" sensation, particularly when blood pressure is high. During these episodes, the patient experiences headaches and balance issues, occurring approximately once a week. The patient  also notes feeling different when blood pressure is low, but finds it difficult to distinguish from high blood pressure symptoms.  The patient has a history of atrial fibrillation and low sodium levels, both of which were managed during previous hospital admissions. The patient also reports significant weight gain recently, despite no significant changes in diet or activity levels. The patient tries to walk around the house once a day, but gets out of breath easily. The patient denies any significant salt or caffeine intake, and does not use any supplements or herbs. The patient uses a CPAP machine for sleep apnea and is compliant with its use.  The patient's current medication regimen for blood pressure includes hydralazine, spironolactone, carvedilol, and amlodipine. The patient's family member notes that the blood pressure seemed to fluctuate more when hydralazine was started, and when amlodipine was added by the patient's primary care physician during an office visit when the blood pressure was 180. The patient's cardiologist in [place] advised the patient to stop taking blood pressure readings except once a day, despite the patient's concerns about high readings. The patient expresses a strong desire to avoid hospitalization if possible.     Previous antihypertensives: ACE-I Carvedilol Chlorthalidone Losartan metoprolol Atenolol Chlorthalidone Hydrochlorothiazide Irbesartan Imdur Hydrochlorothiazide-triamterene  Past Medical History:  Diagnosis Date   Anxiety    C. difficile diarrhea 07/11/2018   S/p multiple oral vanc treatments (course and tapers) and completed Zinplava monoclonal Ab treatment (05/10/2019) Fecal transplant on hold during COVID pandemic   CAP (community acquired pneumonia) 12/22/2017   Carotid stenosis 10/18/2014   R 1-39%, L 40-59%, rpt 1 yr (09/2014)    CKD (chronic kidney disease) stage 3, GFR 30-59 ml/min (HCC) 08/02/2014   Closed fracture of right  orbit (HCC)  02/10/2021   Clostridioides difficile infection    hx 2020   COVID-19 virus infection 10/13/2020   Degenerative disc disease    LS   Depression    nervous breakdonw in 1964-out of work for a year   Diverticulosis    severe by colonoscopy   Family history of adverse reaction to anesthesia    n/v   GERD (gastroesophageal reflux disease)    Glaucoma    Heart murmur 08/2011   mitral regurge - on echo    History of hiatal hernia    History of shingles    HTN (hypertension)    Hyperlipidemia    Hypertension    Hypothyroidism    IBS 11/02/2006   Influenza A 04/20/2022   Intestinal bacterial overgrowth    In small colon   Left shoulder pain 11/04/2014   Lower GI bleed 12/2020   thought diverticular complicated by ABLA with syncope and orbital fracture s/p hospitalization   Maxillary fracture (HCC) 04/08/2012   Osteoarthritis 2016   [place])   Osteoporosis    dexa 2011   Perirectal abscess 08/03/2021   CT guided aspiration of abscess grow Proteums mirabilis and Citrobacter koseri 07/2021, currently receiving IV Zosyn planned total 6 wks.   Pseudogout 2016   shoulders ([place])   Strep pharyngitis 04/20/2022    Past Surgical History:  Procedure Laterality Date   barium enema  2012   severe diverticulosis, redundant colon   Bowel obstruction  1999   no surgery in hosp x 3 days   BUBBLE STUDY  07/27/2021   Procedure: BUBBLE STUDY;  Surgeon: Orpah Cobb, MD;  Location: [facility] ENDOSCOPY;  Service: Cardiovascular;;   CARDIOVERSION N/A 11/19/2021   Procedure: CARDIOVERSION;  Surgeon: Debbe Odea, MD;  Location: [facility] ORS;  Service: Cardiovascular;  Laterality: N/A;   COLONOSCOPY  1. 1999  2. 11/04   1. Not finished  2. Slight hemorrhage rectosigmoid area, severe sig diverticulosis   COLONOSCOPY WITH PROPOFOL N/A 09/10/2019   SSP with dysplasia, TA, rpt 3 yrs Allegra Lai, Loel Dubonnet, MD)   Dexa  1. [phone]   2. 9/04  3. 3/08    1. OP  2. OP, borderline, spine -2.44T  3.  decreased BMD-OP   DG KNEE 1-2 VIEWS BILAT     LS x-ray with degenerative disc and facet change   ESOPHAGOGASTRODUODENOSCOPY     Negative   FLEXIBLE SIGMOIDOSCOPY N/A 01/16/2021   Procedure: FLEXIBLE SIGMOIDOSCOPY;  Surgeon: Iva Boop, MD;  Location: [facility] ENDOSCOPY;  Service: Endoscopy;  Laterality: N/A;  or unsedated   Hemorrhoid procedure  07/2006   PICC LINE REMOVAL ([facility] HX)  08/28/2021   PROCTOSCOPY N/A 05/26/2021   Procedure: RIGID PROCTOSCOPY;  Surgeon: Karie Soda, MD;  Location: [facility] ORS;  Service: General;  Laterality: N/A;   RECTOPEXY N/A 05/26/2021   Procedure: RECTOPEXY;  Surgeon: Karie Soda, MD;  Location: [facility] ORS;  Service: General;  Laterality: N/A;   TEE WITHOUT CARDIOVERSION N/A 07/27/2021   Procedure: TRANSESOPHAGEAL ECHOCARDIOGRAM (TEE);  Surgeon: Orpah Cobb, MD;  Location: [facility] ENDOSCOPY;  Service: Cardiovascular;  Laterality: N/A;   TONSILLECTOMY     TOTAL SHOULDER ARTHROPLASTY Left 11/27/2015   Christena Flake, MD   TOTAL SHOULDER REVISION Left 03/16/2016   Procedure: TOTAL SHOULDER REVISION;  Surgeon: Christena Flake, MD;  Location: [facility] ORS;  Service: Orthopedics;  Laterality: Left;   US ECHOCARDIOGRAPHY  07/2011   Normal systolic fxn with EF 55-60%.  Focal basal septal hypertrophy.  Mild  diastolic dysfunction.  Mild MR.   XI ROBOTIC ASSISTED LOWER ANTERIOR RESECTION N/A 05/26/2021   Procedure: ROBOTIC LOW ANTERIOR RECTOSIGMOID RESECTION; ASSESSMENT OF TISSUE PERFUSION WITH FIREFLY;  Surgeon: Karie Soda, MD;  Location: [facility] ORS;  Service: General;  Laterality: N/A;    Current Medications: Current Meds  Medication Sig   apixaban (ELIQUIS) 5 MG TABS tablet Take 1 tablet (5 mg total) by mouth 2 (two) times daily.   Ascorbic Acid (VITAMIN C PO) Take by mouth.   bimatoprost (LUMIGAN) 0.01 % SOLN Place 1 drop into both eyes at bedtime.   carvedilol (COREG) 3.125 MG tablet Take 1 tablet (3.125 mg total) by mouth 2 (two) times daily with a meal.   cetirizine  (ZYRTEC) 10 MG tablet Take 1 tablet (10 mg total) by mouth 2 (two) times daily.   cholecalciferol (VITAMIN D) 1000 units tablet Take 1,000 Units by mouth daily.   clonazePAM (KLONOPIN) 0.5 MG tablet TAKE 1 TABLET BY MOUTH AT BEDTIME   colchicine 0.6 MG tablet Take 1 tablet (0.6 mg total) by mouth daily.   DULoxetine (CYMBALTA) 20 MG capsule Take 1 capsule (20 mg total) by mouth daily.   EPINEPHrine (EPIPEN 2-PAK) 0.3 mg/0.3 mL IJ SOAJ injection Inject 0.3 mg into the muscle as needed for anaphylaxis. Geoffry Paradise INJECTION PROTOCOL   famotidine (PEPCID) 20 MG tablet Take 1 tablet (20 mg total) by mouth 2 (two) times daily.   Ferrous Sulfate (IRON) 28 MG TABS Take 1 tablet (28 mg total) by mouth every other day.   furosemide (LASIX) 20 MG tablet Take 1 tablet (20 mg total) by mouth daily.   gabapentin (NEURONTIN) 300 MG capsule TAKE 1 CAPSULE BY MOUTH ONCE DAILY AS NEEDED FOR  NERVE  PAIN  (ITCHING)   levothyroxine (SYNTHROID) 100 MCG tablet Take 1 tablet (100 mcg total) by mouth daily before breakfast.   loperamide (IMODIUM) 2 MG capsule Take 1 capsule (2 mg total) by mouth 3 (three) times daily as needed for diarrhea or loose stools.   mometasone (ELOCON) 0.1 % ointment Apply topically daily. Shower than ointment applied while still wet daily   Multiple Vitamin (MULTIVITAMIN ADULT PO) Take by mouth daily.   polyethylene glycol (MIRALAX / GLYCOLAX) 17 g packet Take 17 g by mouth daily.   Potassium 99 MG TABS Take 1 tablet (99 mg total) by mouth every Monday, Wednesday, and Friday.   Simethicone 180 MG CAPS Take 1 capsule by mouth daily as needed (indigestion).   spironolactone (ALDACTONE) 25 MG tablet Take 1 tablet (25 mg total) by mouth daily.   timolol (TIMOPTIC) 0.5 % ophthalmic solution Place 1 drop into both eyes every morning.   [DISCONTINUED] amLODipine (NORVASC) 2.5 MG tablet Take 1 tablet (2.5 mg total) by mouth daily.   [DISCONTINUED] hydrALAZINE (APRESOLINE) 100 MG tablet Take 1 tablet (100  mg total) by mouth 3 (three) times daily.   Current Facility-Administered Medications for the 10/18/22 encounter (Office Visit) with Chilton Si, MD  Medication   omalizumab Geoffry Paradise) prefilled syringe 150 mg     Allergies:   Ace inhibitors, Amlodipine, Carvedilol, Chlorthalidone, Montelukast, Other, Risedronate sodium, Simvastatin, Alendronate sodium, Influenza vaccines, Losartan, Paroxetine hcl, Silenor [doxepin hcl], Sulfonamide derivatives, and Toprol xl [metoprolol]   Social History   Socioeconomic History   Marital status: Widowed    Spouse name: Not on file   Number of children: 2   Years of education: Not on file   Highest education level: 12th grade  Occupational History   Occupation:  Retired    Associate Professor: RETIRED  Tobacco Use   Smoking status: Never   Smokeless tobacco: Never  Vaping Use   Vaping Use: Never used  Substance and Sexual Activity   Alcohol use: No   Drug use: No   Sexual activity: Not Currently  Other Topics Concern   Not on file  Social History Narrative   Left handed   Widow. Husband ([name]) deceased April 24, 2016 from dementia. She was caregiver.    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   Activity: no regular exercise   Diet: good water, fruits/vegetables daily      Patient Care Team:   Eustaquio Boyden, MD as PCP - General (Family Medicine)   Debbe Odea, MD as PCP - Cardiology (Cardiology)   Otho Ket, RN as [facility] Care Management   Gross, Viviann Spare, MD as Consulting Physician (General Surgery)   Toney Reil, MD as Consulting Physician (Gastroenterology)   Debbe Odea, MD as Consulting Physician (Cardiology)   Social Determinants of Health   Financial Resource Strain: Patient Declined (08/24/2022)   Overall Financial Resource Strain (CARDIA)    Difficulty of Paying Living Expenses: Patient declined  Food Insecurity: No Food  Insecurity (08/24/2022)   Hunger Vital Sign    Worried About Running Out of Food in the Last Year: Never true    Ran Out of Food in the Last Year: Never true  Transportation Needs: Unknown (08/24/2022)   PRAPARE - Transportation    Lack of Transportation (Medical): No    Lack of Transportation (Non-Medical): Not on file  Physical Activity: Unknown (08/24/2022)   Exercise Vital Sign    Days of Exercise per Week: 0 days    Minutes of Exercise per Session: Not on file  Stress: Patient Declined (08/24/2022)   [employer] of Occupational Health - Occupational Stress Questionnaire    Feeling of Stress : Patient declined  Social Connections: Moderately Isolated (08/24/2022)   Social Connection and Isolation Panel [NHANES]    Frequency of Communication with Friends and Family: More than three times a week    Frequency of Social Gatherings with Friends and Family: Once a week    Attends Religious Services: 1 to 4 times per year    Active Member of [employer] or Organizations: No    Attends Banker Meetings: Not on file    Marital Status: Widowed     Family History: The patient's family history includes Breast cancer in her cousin; Coronary artery disease in her brother; Diabetes in her brother and paternal grandfather.  ROS:   Please see the history of present illness.     All other systems reviewed and are negative.  EKGs/Labs/Other Studies Reviewed:    EKG:  EKG is not ordered today.    Recent Labs: 06/16/2022: Magnesium 1.9 08/06/2022: ALT 25 09/13/2022: Hemoglobin 14.6; Platelets 226.0 10/06/2022: BUN 20; Creatinine, Ser 1.10; Potassium 4.2; Sodium 137; TSH 4.81   Recent Lipid Panel    Component Value Date/Time   CHOL 181 08/06/2022 0841   CHOL 179 03/27/2021 0935   TRIG 79.0 08/06/2022 0841   HDL 64.70 08/06/2022 0841   HDL 71 03/27/2021 0935   CHOLHDL 3 08/06/2022 0841   VLDL 15.8 08/06/2022 0841   LDLCALC 101 (H) 08/06/2022 0841   LDLCALC 95 03/27/2021 0935    LDLDIRECT 86.0 09/19/2014 1049    Physical Exam:   VS:  BP Marland Kitchen)  154/80   Pulse 75   Ht 5' (1.524 m)   Wt 170 lb (77.1 kg)   SpO2 96%   BMI 33.20 kg/m  , BMI Body mass index is 33.2 kg/m. GENERAL:  Well appearing HEENT: Pupils equal round and reactive, fundi not visualized, oral mucosa unremarkable NECK:  No jugular venous distention, waveform within normal limits, carotid upstroke brisk and symmetric, no bruits, no thyromegaly LUNGS:  Clear to auscultation bilaterally HEART:  RRR.  PMI not displaced or sustained,S1 and S2 within normal limits, no S3, no S4, no clicks, no rubs, III/VI mid-peaking systolic murmur at the LUSB ABD:  Flat, positive bowel sounds normal in frequency in pitch, no bruits, no rebound, no guarding, no midline pulsatile mass, no hepatomegaly, no splenomegaly EXT:  2 plus pulses throughout, no edema, no cyanosis no clubbing SKIN:  No rashes no nodules NEURO:  Cranial nerves II through XII grossly intact, motor grossly intact throughout PSYCH:  Cognitively intact, oriented to person place and time   ASSESSMENT/PLAN:    # Hypertension:  Uncontrolled with significant fluctuations. Currently on Hydralazine 100mg  TID, Carvedilol, and Amlodipine 2.5mg  daily. Recent addition of Amlodipine by primary care physician. Noted recent weight gain and increased shortness of breath. -Increase Amlodipine to 2.5mg  twice daily. -Order labs to check for hormonal imbalances that could be contributing to blood pressure fluctuations.  Catecholamines, metanephrines, renin, aldosterone) -Order CT scan to assess renal arteries for possible stenosis. -Repeat echocardiogram due to increased shortness of breath and to monitor aortic valve calcification. -Order stress test due to increased shortness of breath with exertion. -Continue Hydralazine and Carvedilol at current doses. -Follow up in one month with blood pressure log.  # Shortness of Breath: Increased over the past few days. No  associated chest pain. Noted to be worse with exertion. -Order echocardiogram and stress test to evaluate cardiac function. -Continue current medications and monitor symptoms.  # Weight Gain: Noted recent weight gain of 10 pounds. Thyroid levels recently checked and within normal limits. -Continue current thyroid medication regimen. -Monitor weight and symptoms.  # Aortic Valve Calcification: Noted on previous echocardiogram. No current symptoms of aortic stenosis. -Repeat echocardiogram to monitor progression. -Continue current medications and monitor symptoms.   # PAF: Currently in sinus rhythm.  Continue Eliquis and carvedilol.      Screening for Secondary Hypertension:     10/18/2022    3:11 PM  Causes  Drugs/Herbals Screened     - Comments limits added salt.  No caffeine or alcohol.  Renovascular HTN Screened     - Comments abdominal CT-A. Renal Doppler inconclusive 08/2021  Sleep Apnea Screened     - Comments uses CPAP  Thyroid Disease Screened  Hyperaldosteronism Screened     - Comments check renin and aldosterone  Pheochromocytoma Screened     - Comments check catecholamines and metanephrines  Cushing's Syndrome N/A  Hyperparathyroidism Screened  Coarctation of the Aorta Screened  Compliance Screened    Relevant Labs/Studies:    Latest Ref Rng & Units 10/06/2022   12:47 PM 09/15/2022    2:41 PM 08/06/2022    8:41 AM  Basic Labs  Sodium 135 - 145 mEq/L 137  135  137   Potassium 3.5 - 5.1 mEq/L 4.2  4.4  4.2   Creatinine 0.40 - 1.20 mg/dL 4.09  8.11  9.14        Latest Ref Rng & Units 10/06/2022   12:47 PM 06/16/2022   10:03 AM  Thyroid   TSH  0.35 - 5.50 uIU/mL 4.81  1.75              Latest Ref Rng & Units 09/07/2021    4:31 AM  Cortisol  Cortisol  ug/dL 16.1        0/12/6043    9:03 AM  Renovascular   Renal Artery [place] Completed Yes     Disposition:    FU with MD/PharmD in 1 month    Medication Adjustments/Labs and Tests Ordered: Current  medicines are reviewed at length with the patient today.  Concerns regarding medicines are outlined above.  Orders Placed This Encounter  Procedures   CT ANGIO ABDOMEN PELVIS  W & WO CONTRAST   Metanephrines, plasma   Catecholamines, fractionated, plasma   Aldosterone + renin activity w/ ratio   Basic metabolic panel   MYOCARDIAL PERFUSION IMAGING   ECHOCARDIOGRAM COMPLETE   Meds ordered this encounter  Medications   hydrALAZINE (APRESOLINE) 100 MG tablet    Sig: Take 1 tablet (100 mg total) by mouth 3 (three) times daily.    Dispense:  90 tablet    Refill:  3   amLODipine (NORVASC) 2.5 MG tablet    Sig: Take 1 tablet (2.5 mg total) by mouth 2 (two) times daily.    Dispense:  180 tablet    Refill:  3    NEW DOSE, D/G ONCE A DAY RX. PATIENT WILL CALL WHEN NEEDS FILLED     Signed, Chilton Si, MD  10/18/2022 6:12 PM    [facility]

## 2022-10-18 NOTE — Patient Instructions (Addendum)
Medication Instructions:  INCREASE YOUR AMLODIPINE TO 2.5 MG TWICE A DAY   Labwork: RENIN/ALDOSTERONE/CATACHOLAMINES/ METANEPHRINES SOON  NEED TO HAVE DONE FIRST THING IN THE MORNING  TO BE DONE 1 WEEK PRIOR TO CT   Testing/Procedures: Your physician has requested that you have an echocardiogram. Echocardiography is a painless test that uses sound waves to create images of your heart. It provides your doctor with information about the size and shape of your heart and how well your heart's chambers and valves are working. This procedure takes approximately one hour. There are no restrictions for this procedure. Please do NOT wear cologne, perfume, aftershave, or lotions (deodorant is allowed). Please arrive 15 minutes prior to your appointment time.  Your physician has requested that you have a lexiscan myoview. For further information please visit [email or web address] Please follow instruction sheet, as given.  CT-scan of the abdomen   Follow-Up: 1 MONTH WITH DR Aurora OR CAITLIN W NP IN [facility] CLIN[facility]

## 2022-10-18 NOTE — Telephone Encounter (Signed)
Patient seen today, also printed

## 2022-10-19 ENCOUNTER — Telehealth (HOSPITAL_COMMUNITY): Payer: Self-pay | Admitting: *Deleted

## 2022-10-19 DIAGNOSIS — R0602 Shortness of breath: Secondary | ICD-10-CM | POA: Diagnosis not present

## 2022-10-19 DIAGNOSIS — I10 Essential (primary) hypertension: Secondary | ICD-10-CM | POA: Diagnosis not present

## 2022-10-19 NOTE — Telephone Encounter (Signed)
Pt reached and given instructions for MPI study. 

## 2022-10-21 ENCOUNTER — Ambulatory Visit (HOSPITAL_COMMUNITY): Payer: PPO | Attending: Cardiology

## 2022-10-21 DIAGNOSIS — I6529 Occlusion and stenosis of unspecified carotid artery: Secondary | ICD-10-CM | POA: Insufficient documentation

## 2022-10-21 DIAGNOSIS — R0602 Shortness of breath: Secondary | ICD-10-CM | POA: Diagnosis not present

## 2022-10-21 DIAGNOSIS — I4819 Other persistent atrial fibrillation: Secondary | ICD-10-CM | POA: Insufficient documentation

## 2022-10-21 LAB — MYOCARDIAL PERFUSION IMAGING
LV dias vol: 66 mL (ref 46–106)
LV sys vol: 17 mL
Nuc Stress EF: 74 %
Peak HR: 79 {beats}/min
Rest HR: 68 {beats}/min
Rest Nuclear Isotope Dose: 9.7 mCi
SDS: 1
SRS: 2
SSS: 3
ST Depression (mm): 0 mm
Stress Nuclear Isotope Dose: 32.3 mCi
TID: 0.93

## 2022-10-21 MED ORDER — TECHNETIUM TC 99M TETROFOSMIN IV KIT
9.7000 | PACK | Freq: Once | INTRAVENOUS | Status: AC | PRN
  Administered 2022-10-21: 9.7 via INTRAVENOUS

## 2022-10-21 MED ORDER — REGADENOSON 0.4 MG/5ML IV SOLN
0.4000 mg | Freq: Once | INTRAVENOUS | Status: AC
  Administered 2022-10-21: 0.4 mg via INTRAVENOUS

## 2022-10-21 MED ORDER — TECHNETIUM TC 99M TETROFOSMIN IV KIT
32.3000 | PACK | Freq: Once | INTRAVENOUS | Status: AC | PRN
  Administered 2022-10-21: 32.3 via INTRAVENOUS

## 2022-10-25 ENCOUNTER — Ambulatory Visit
Admission: RE | Admit: 2022-10-25 | Discharge: 2022-10-25 | Disposition: A | Discharge status: Home / Self Care | Payer: PPO | Source: Ambulatory Visit | Attending: Cardiovascular Disease | Admitting: Cardiovascular Disease

## 2022-10-25 DIAGNOSIS — Q2733 Arteriovenous malformation of digestive system vessel: Secondary | ICD-10-CM | POA: Diagnosis not present

## 2022-10-25 DIAGNOSIS — I701 Atherosclerosis of renal artery: Secondary | ICD-10-CM | POA: Diagnosis not present

## 2022-10-25 DIAGNOSIS — Q272 Other congenital malformations of renal artery: Secondary | ICD-10-CM | POA: Diagnosis not present

## 2022-10-25 DIAGNOSIS — I728 Aneurysm of other specified arteries: Secondary | ICD-10-CM | POA: Diagnosis not present

## 2022-10-25 MED ORDER — IOHEXOL 350 MG/ML SOLN
100.0000 mL | Freq: Once | INTRAVENOUS | Status: AC | PRN
  Administered 2022-10-25: 100 mL via INTRAVENOUS

## 2022-10-26 LAB — CATECHOLAMINES, FRACTIONATED, PLASMA
Dopamine: <30 pg/mL (ref 0–48)
Epinephrine: 33 pg/mL (ref 0–62)
Norepinephrine: 1538 pg/mL — ABNORMAL HIGH (ref 0–874)

## 2022-10-26 LAB — BASIC METABOLIC PANEL
BUN/Creatinine Ratio: 15 (ref 12–28)
BUN: 19 mg/dL (ref 8–27)
CO2: 27 mmol/L (ref 20–29)
Calcium: 9.3 mg/dL (ref 8.7–10.3)
Chloride: 99 mmol/L (ref 96–106)
Creatinine, Ser: 1.26 mg/dL — ABNORMAL HIGH (ref 0.57–1.00)
Glucose: 102 mg/dL — ABNORMAL HIGH (ref 70–99)
Potassium: 4.4 mmol/L (ref 3.5–5.2)
Sodium: 139 mmol/L (ref 134–144)
eGFR: 41 mL/min/{1.73_m2} — ABNORMAL LOW (ref >59)

## 2022-10-26 LAB — ALDOSTERONE + RENIN ACTIVITY W/ RATIO
Aldos/Renin Ratio: 8.9 (ref 0.0–30.0)
Aldosterone: 21 ng/dL (ref 0.0–30.0)
Renin Activity, Plasma: 2.366 ng/mL/hr (ref 0.167–5.380)

## 2022-10-26 LAB — METANEPHRINES, PLASMA
Metanephrine, Free: <25 pg/mL (ref 0.0–88.0)
Normetanephrine, Free: 132.4 pg/mL (ref 0.0–297.2)

## 2022-11-03 ENCOUNTER — Ambulatory Visit (INDEPENDENT_AMBULATORY_CARE_PROVIDER_SITE_OTHER): Payer: PPO

## 2022-11-03 DIAGNOSIS — L501 Idiopathic urticaria: Secondary | ICD-10-CM | POA: Diagnosis not present

## 2022-11-04 ENCOUNTER — Encounter (HOSPITAL_BASED_OUTPATIENT_CLINIC_OR_DEPARTMENT_OTHER): Payer: Self-pay | Admitting: Cardiovascular Disease

## 2022-11-04 NOTE — Telephone Encounter (Signed)
Please review ct and advise for swelling

## 2022-11-11 ENCOUNTER — Institutional Professional Consult (permissible substitution) (HOSPITAL_BASED_OUTPATIENT_CLINIC_OR_DEPARTMENT_OTHER): Payer: PPO | Admitting: Cardiovascular Disease

## 2022-11-17 ENCOUNTER — Encounter: Payer: Self-pay | Admitting: Family Medicine

## 2022-11-17 ENCOUNTER — Ambulatory Visit: Payer: PPO | Admitting: Family Medicine

## 2022-11-17 ENCOUNTER — Telehealth: Payer: Self-pay | Admitting: Family Medicine

## 2022-11-17 VITALS — BP 122/70 | HR 61 | Temp 97.5°F | Ht 60.0 in | Wt 168.4 lb

## 2022-11-17 DIAGNOSIS — J029 Acute pharyngitis, unspecified: Secondary | ICD-10-CM | POA: Diagnosis not present

## 2022-11-17 DIAGNOSIS — R0602 Shortness of breath: Secondary | ICD-10-CM

## 2022-11-17 DIAGNOSIS — N1832 Chronic kidney disease, stage 3b: Secondary | ICD-10-CM | POA: Diagnosis not present

## 2022-11-17 DIAGNOSIS — I10 Essential (primary) hypertension: Secondary | ICD-10-CM | POA: Diagnosis not present

## 2022-11-17 DIAGNOSIS — U071 COVID-19: Secondary | ICD-10-CM | POA: Diagnosis not present

## 2022-11-17 LAB — POC COVID19 BINAXNOW: SARS Coronavirus 2 Ag: POSITIVE — AB

## 2022-11-17 LAB — POCT RAPID STREP A (OFFICE): Rapid Strep A Screen: NEGATIVE

## 2022-11-17 MED ORDER — NIRMATRELVIR/RITONAVIR (PAXLOVID) TABLET (RENAL DOSING): 2.0000 | ORAL_TABLET | Freq: Two times a day (BID) | ORAL | 0 refills | Status: AC

## 2022-11-17 NOTE — Progress Notes (Signed)
Ph: [phone] Fax: [phone]   Patient ID: Brooke Beasley, female    DOB: 1930-12-20, 89 y.o.   MRN: [id number]  This visit was conducted in person.  BP 122/70   Pulse 61   Temp (!) 97.5 F (36.4 C) (Temporal)   Ht 5' (1.524 m)   Wt 168 lb 6 oz (76.4 kg)   SpO2 95%   BMI 32.88 kg/m    CC: respiratory symptoms  Subjective:   HPI: Brooke Beasley is a 89 y.o. female presenting on 11/17/2022 for Fever (C/o fever- max 100.6, runny nose, sneezing, chest congestion, fatigue, worsened SOB and ST. Sxs started 11/14/22. Pt accompanied by daughter, [name]. )   3d h/o low grade fever Tmax 100.6, rhinorrhea, sneezing, chest congestion, fatigue, dyspnea, ST. Mild cough. + nausea, abd pain  No ear or tooth pain, diarrhea, loss of taste/smell. Decreased appetite. Sick contacts at home.   She's been taking mucinex without much relief.  Continues zyrtec twice daily.  Pushing fluids and rest.   No h/o asthma or COPD.  Non smoker.  COVID vaccine x2.  Prior COVID infection, last 2 yrs ago.      Relevant past medical, surgical, family and social history reviewed and updated as indicated. Interim medical history since our last visit reviewed. Allergies and medications reviewed and updated. Outpatient Medications Prior to Visit  Medication Sig Dispense Refill   amLODipine (NORVASC) 2.5 MG tablet Take 1 tablet (2.5 mg total) by mouth 2 (two) times daily. 180 tablet 3   apixaban (ELIQUIS) 5 MG TABS tablet Take 1 tablet (5 mg total) by mouth 2 (two) times daily. 60 tablet 11   Ascorbic Acid (VITAMIN C PO) Take by mouth.     bimatoprost (LUMIGAN) 0.01 % SOLN Place 1 drop into both eyes at bedtime.     carvedilol (COREG) 3.125 MG tablet Take 1 tablet (3.125 mg total) by mouth 2 (two) times daily with a meal. 60 tablet 6   cetirizine (ZYRTEC) 10 MG tablet Take 1 tablet (10 mg total) by mouth 2 (two) times daily. 180 tablet 1   cholecalciferol (VITAMIN D) 1000 units tablet  Take 1,000 Units by mouth daily.     clonazePAM (KLONOPIN) 0.5 MG tablet TAKE 1 TABLET BY MOUTH AT BEDTIME 30 tablet 3   colchicine 0.6 MG tablet Take 1 tablet (0.6 mg total) by mouth daily. 90 tablet 1   DULoxetine (CYMBALTA) 20 MG capsule Take 1 capsule (20 mg total) by mouth daily. 90 capsule 4   EPINEPHrine (EPIPEN 2-PAK) 0.3 mg/0.3 mL IJ SOAJ injection Inject 0.3 mg into the muscle as needed for anaphylaxis. Geoffry Paradise INJECTION PROTOCOL 2 each 2   famotidine (PEPCID) 20 MG tablet Take 1 tablet (20 mg total) by mouth 2 (two) times daily. 180 tablet 1   Ferrous Sulfate (IRON) 28 MG TABS Take 1 tablet (28 mg total) by mouth every other day.     furosemide (LASIX) 20 MG tablet Take 1 tablet (20 mg total) by mouth daily. 90 tablet 4   gabapentin (NEURONTIN) 300 MG capsule TAKE 1 CAPSULE BY MOUTH ONCE DAILY AS NEEDED FOR  NERVE  PAIN  (ITCHING) 30 capsule 3   hydrALAZINE (APRESOLINE) 100 MG tablet Take 1 tablet (100 mg total) by mouth 3 (three) times daily. 90 tablet 3   levothyroxine (SYNTHROID) 100 MCG tablet Take 1 tablet (100 mcg total) by mouth daily before breakfast. 90 tablet 4   loperamide (IMODIUM) 2 MG capsule Take 1 capsule (  2 mg total) by mouth 3 (three) times daily as needed for diarrhea or loose stools. 30 capsule 0   mometasone (ELOCON) 0.1 % ointment Apply topically daily. Shower than ointment applied while still wet daily 135 g 1   Multiple Vitamin (MULTIVITAMIN ADULT PO) Take by mouth daily.     polyethylene glycol (MIRALAX / GLYCOLAX) 17 g packet Take 17 g by mouth daily.     Potassium 99 MG TABS Take 1 tablet (99 mg total) by mouth every Monday, Wednesday, and Friday. 330 tablet    Simethicone 180 MG CAPS Take 1 capsule by mouth daily as needed (indigestion).     spironolactone (ALDACTONE) 25 MG tablet Take 1 tablet (25 mg total) by mouth daily. 90 tablet 4   timolol (TIMOPTIC) 0.5 % ophthalmic solution Place 1 drop into both eyes every morning.     Facility-Administered  Medications Prior to Visit  Medication Dose Route Frequency Provider Last Rate Last Admin   omalizumab Geoffry Paradise) prefilled syringe 150 mg  150 mg Subcutaneous Q28 days Kozlow, Alvira Philips, MD   150 mg at 11/03/22 6213     Per HPI unless specifically indicated in ROS section below Review of Systems  Objective:  BP 122/70   Pulse 61   Temp (!) 97.5 F (36.4 C) (Temporal)   Ht 5' (1.524 m)   Wt 168 lb 6 oz (76.4 kg)   SpO2 95%   BMI 32.88 kg/m   Wt Readings from Last 3 Encounters:  11/17/22 168 lb 6 oz (76.4 kg)  10/18/22 170 lb (77.1 kg)  10/06/22 167 lb 2 oz (75.8 kg)      Physical Exam Vitals and nursing note reviewed.  Constitutional:      Appearance: Normal appearance. She is not ill-appearing.  HENT:     Head: Normocephalic and atraumatic.     Right Ear: Tympanic membrane, ear canal and external ear normal. There is no impacted cerumen.     Left Ear: Tympanic membrane, ear canal and external ear normal. There is no impacted cerumen.     Mouth/Throat:     Comments: Wearing mask Eyes:     Extraocular Movements: Extraocular movements intact.     Conjunctiva/sclera: Conjunctivae normal.     Pupils: Pupils are equal, round, and reactive to light.  Cardiovascular:     Rate and Rhythm: Normal rate and regular rhythm.     Pulses: Normal pulses.     Heart sounds: Murmur (3/6 systolic) heard.  Pulmonary:     Effort: Pulmonary effort is normal. No respiratory distress.     Breath sounds: Normal breath sounds. No wheezing, rhonchi or rales.  Lymphadenopathy:     Head:     Right side of head: No submental, submandibular, tonsillar, preauricular or posterior auricular adenopathy.     Left side of head: No submental, submandibular, tonsillar, preauricular or posterior auricular adenopathy.     Cervical: No cervical adenopathy.     Right cervical: No superficial cervical adenopathy.    Left cervical: No superficial cervical adenopathy.     Upper Body:     Right upper body: No  supraclavicular adenopathy.     Left upper body: No supraclavicular adenopathy.  Skin:    Findings: No rash.  Neurological:     Mental Status: She is alert.  Psychiatric:        Mood and Affect: Mood normal.        Behavior: Behavior normal.       Results for orders  placed or performed in visit on 11/17/22  POC COVID-19 BinaxNow  Result Value Ref Range   SARS Coronavirus 2 Ag Positive (A) Negative  POCT rapid strep A  Result Value Ref Range   Rapid Strep A Screen Negative Negative   *Note: Due to a large number of results and/or encounters for the requested time period, some results have not been displayed. A complete set of results can be found in Results Review.   Lab Results  Component Value Date   NA 139 10/19/2022   CL 99 10/19/2022   K 4.4 10/19/2022   CO2 27 10/19/2022   BUN 19 10/19/2022   CREATININE 1.26 (H) 10/19/2022   EGFR 41 (L) 10/19/2022   CALCIUM 9.3 10/19/2022   PHOS 2.1 (L) 09/10/2021   ALBUMIN 4.2 08/06/2022   GLUCOSE 102 (H) 10/19/2022   Assessment & Plan:   Problem List Items Addressed This Visit     Hypertension, essential   Stage 3b chronic kidney disease (HCC)   COVID-19 virus infection - Primary    Reviewed currently approved antiviral treatments. Discussed possible rebound COVID.  Reviewed expected course of illness, anticipated course of recovery, as well as red flags to suggest COVID pneumonia and/or to seek urgent in-person care. Reviewed CDC isolation/quarantine guidelines.  Encouraged fluids and rest. Reviewed further supportive care measures at home including vit C 500mg  bid, vit D 2000 IU daily, zinc 100mg  daily, tylenol PRN, pepcid 20mg  BID PRN.   Recommend:  Renally dosed paxlovid Paxlovid drug interactions:   Amlodipine - drop amlodipine to once daily while on paxlovid Eliquis - drop eliquis to 1/2 tablet twice daily while on paxlovid Clonazepam - cut in half while on paxlovid  Colchicine - hold colchicine while on paxlovid        Relevant Medications   nirmatrelvir/ritonavir, renal dosing, (PAXLOVID) 10 x 150 MG & 10 x 100MG  TABS   Other Visit Diagnoses     Sore throat       Relevant Orders   POCT rapid strep A (Completed)   Shortness of breath       Relevant Orders   POC COVID-19 BinaxNow (Completed)        Meds ordered this encounter  Medications   nirmatrelvir/ritonavir, renal dosing, (PAXLOVID) 10 x 150 MG & 10 x 100MG  TABS    Sig: Take 2 tablets by mouth 2 (two) times daily for 5 days. (Take nirmatrelvir 150 mg one tablet twice daily for 5 days and ritonavir 100 mg one tablet twice daily for 5 days) Patient GFR is 41    Dispense:  20 tablet    Refill:  0    Orders Placed This Encounter  Procedures   POC COVID-19 BinaxNow    Order Specific Question:   Previously tested for COVID-19    Answer:   Yes    Order Specific Question:   Resident in a congregate (group) care setting    Answer:   No    Order Specific Question:   Employed in healthcare setting    Answer:   No    Order Specific Question:   Pregnant    Answer:   No   POCT rapid strep A    Patient Instructions  You have COVID infection - take paxlovid sent to pharmacy.  Push fluids and rest.  May take vitamin C, vitamin D, zinc to boost immune system. May use pepcid as needed.  Let us know if not improving with treatment.   Drug interactions with  Paxlovid: Amlodipine - drop amlodipine to once daily while on paxlovid Eliquis - drop eliquis to 1/2 tablet twice daily while on paxlovid Clonazepam - cut in half while on paxlovid  Colchicine - hold colchicine while on paxlovid   Follow up plan: No follow-ups on file.  Eustaquio Boyden, MD

## 2022-11-17 NOTE — Assessment & Plan Note (Addendum)
Reviewed currently approved antiviral treatments. Discussed possible rebound COVID.  Reviewed expected course of illness, anticipated course of recovery, as well as red flags to suggest COVID pneumonia and/or to seek urgent in-person care. Reviewed CDC isolation/quarantine guidelines.  Encouraged fluids and rest. Reviewed further supportive care measures at home including vit C 500mg  bid, vit D 2000 IU daily, zinc 100mg  daily, tylenol PRN, pepcid 20mg  BID PRN.   Recommend:  Renally dosed paxlovid Paxlovid drug interactions:   Amlodipine - drop amlodipine to once daily while on paxlovid Eliquis - drop eliquis to 1/2 tablet twice daily while on paxlovid Clonazepam - cut in half while on paxlovid  Colchicine - hold colchicine while on paxlovid

## 2022-11-17 NOTE — Patient Instructions (Signed)
You have COVID infection - take paxlovid sent to pharmacy.  Push fluids and rest.  May take vitamin C, vitamin D, zinc to boost immune system. May use pepcid as needed.  Let us know if not improving with treatment.   Drug interactions with Paxlovid: Amlodipine - drop amlodipine to once daily while on paxlovid Eliquis - drop eliquis to 1/2 tablet twice daily while on paxlovid Clonazepam - cut in half while on paxlovid  Colchicine - hold colchicine while on paxlovid

## 2022-11-17 NOTE — Telephone Encounter (Addendum)
See mychart message. Called pt to notify I've sent website through [employer] for paxlovid assistance program.

## 2022-11-17 NOTE — Telephone Encounter (Signed)
Patient was advised to call in if medication nirmatrelvir/ritonavir, renal dosing, (PAXLOVID) 10 x 150 MG & 10 x 100MG  TABS was too expensive for her to purchase,and Dr Reece Agar would try to help her get it at a cheaper price.She called in stating that it is costing her 335 dollars.

## 2022-11-18 ENCOUNTER — Other Ambulatory Visit (HOSPITAL_BASED_OUTPATIENT_CLINIC_OR_DEPARTMENT_OTHER): Payer: PPO

## 2022-11-24 ENCOUNTER — Encounter (INDEPENDENT_AMBULATORY_CARE_PROVIDER_SITE_OTHER): Payer: Self-pay

## 2022-11-25 ENCOUNTER — Ambulatory Visit (HOSPITAL_BASED_OUTPATIENT_CLINIC_OR_DEPARTMENT_OTHER): Payer: PPO | Admitting: Family

## 2022-11-27 ENCOUNTER — Other Ambulatory Visit: Payer: Self-pay | Admitting: Allergy and Immunology

## 2022-11-29 ENCOUNTER — Encounter (HOSPITAL_BASED_OUTPATIENT_CLINIC_OR_DEPARTMENT_OTHER): Payer: Self-pay | Admitting: Cardiovascular Disease

## 2022-11-29 ENCOUNTER — Telehealth: Payer: Self-pay | Admitting: Family Medicine

## 2022-11-29 MED ORDER — AMOXICILLIN 875 MG PO TABS: 875.0000 mg | ORAL_TABLET | Freq: Two times a day (BID) | ORAL | 0 refills | Status: AC

## 2022-11-29 MED ORDER — AMLODIPINE BESYLATE 2.5 MG PO TABS: 2.5000 mg | ORAL_TABLET | Freq: Two times a day (BID) | ORAL | 3 refills | Status: DC

## 2022-11-29 NOTE — Telephone Encounter (Signed)
Spoke with daughter [name] - over weekend pt started feeling worse- fatigue/malaise, nasal and head > chest congestion, started taking mucinex, spitting up colored mucous. No fevers/chills.  Will Rx amoxicillin antibiotic 10d course to cover possible acute sinusitis.

## 2022-11-29 NOTE — Telephone Encounter (Signed)
Patient was seen on 11/17/2022.She tested positive for covid and placed on an antiviral.Her daughter called in today stating that she has a lot of mucus and very congested,.but no fever.She has started to spit up all of the mucus as well. She would like to know does she need to be reevaluated again,or is there anything that could be called in for her?  [facility] [place], [place] - [address] Phone: [phone]  Fax: [phone]

## 2022-12-01 ENCOUNTER — Ambulatory Visit (INDEPENDENT_AMBULATORY_CARE_PROVIDER_SITE_OTHER): Payer: PPO

## 2022-12-01 DIAGNOSIS — L501 Idiopathic urticaria: Secondary | ICD-10-CM | POA: Diagnosis not present

## 2022-12-04 ENCOUNTER — Other Ambulatory Visit: Payer: Self-pay | Admitting: Allergy and Immunology

## 2022-12-10 ENCOUNTER — Encounter: Payer: Self-pay | Admitting: Family Medicine

## 2022-12-10 ENCOUNTER — Ambulatory Visit (INDEPENDENT_AMBULATORY_CARE_PROVIDER_SITE_OTHER): Payer: PPO | Admitting: Family Medicine

## 2022-12-10 VITALS — BP 128/70 | HR 65 | Temp 97.6°F | Ht 60.0 in | Wt 171.0 lb

## 2022-12-10 DIAGNOSIS — L501 Idiopathic urticaria: Secondary | ICD-10-CM

## 2022-12-10 DIAGNOSIS — I150 Renovascular hypertension: Secondary | ICD-10-CM

## 2022-12-10 DIAGNOSIS — L299 Pruritus, unspecified: Secondary | ICD-10-CM

## 2022-12-10 DIAGNOSIS — I38 Endocarditis, valve unspecified: Secondary | ICD-10-CM

## 2022-12-10 DIAGNOSIS — R0609 Other forms of dyspnea: Secondary | ICD-10-CM | POA: Diagnosis not present

## 2022-12-10 NOTE — Patient Instructions (Addendum)
Check with pharmacy about generic Eliquis (apixiban).  Take 2 lasix for the next 2 days Then when needed for leg swelling, may take 2 lasix at the same time.  Continue current medicines otherwise Return in 4 months for follow up visit

## 2022-12-10 NOTE — Progress Notes (Signed)
Ph: [phone] Fax: [phone]   Patient ID: Brooke Beasley, female    DOB: 06-09-1930, 89 y.o.   MRN: [id number]  This visit was conducted in person.  BP 128/70   Pulse 65   Temp 97.6 F (36.4 C) (Temporal)   Ht 5' (1.524 m)   Wt 171 lb (77.6 kg)   SpO2 95%   BMI 33.40 kg/m    CC: 4 mo f/u visit  Subjective:   HPI: Brooke Beasley is a 89 y.o. female presenting on 12/10/2022 for Medical Management of Chronic Issues (Here for 4 mo f/u. Pt accompanied by daughter, [name]. )   COVID infection 3 wks ago treated with renally dosed Paxlovid, followed by amoxicillin 10d course 10d later due to deterioration after initial improvement, fatigue/malaise, sinus congestion with purulent discharge.   Difficult to treat hypertension due to drug intolerances - established with cardiology Dr Duke Salvia - currently on amlodipine 2.5mg  BID, carvedilol 3.125mg  BID, lasix 20mg  daily, hydralazine 100mg  TID, spironolactone 25mg  daily.  Echocardiogram scheduled next week.   Notes increasing ankle swelling and mild increase in exertional dyspnea, manages with extra lasix pill a few times a week.   OP - latest Prolia injection 09/28/2022 with possible reaction.  Se receives immunotherapy Omalizumab injection for urticaria and pruritus, latest 12/01/2022. No recent urticaria but itching continues.      Relevant past medical, surgical, family and social history reviewed and updated as indicated. Interim medical history since our last visit reviewed. Allergies and medications reviewed and updated. Outpatient Medications Prior to Visit  Medication Sig Dispense Refill   amLODipine (NORVASC) 2.5 MG tablet Take 1 tablet (2.5 mg total) by mouth 2 (two) times daily. 180 tablet 3   apixaban (ELIQUIS) 5 MG TABS tablet Take 1 tablet (5 mg total) by mouth 2 (two) times daily. 60 tablet 11   Ascorbic Acid (VITAMIN C PO) Take by mouth.     bimatoprost (LUMIGAN) 0.01 % SOLN Place 1 drop into both  eyes at bedtime.     carvedilol (COREG) 3.125 MG tablet Take 1 tablet (3.125 mg total) by mouth 2 (two) times daily with a meal. 60 tablet 6   cetirizine (ZYRTEC) 10 MG tablet Take 1 tablet by mouth once daily 90 tablet 0   cholecalciferol (VITAMIN D) 1000 units tablet Take 1,000 Units by mouth daily.     clonazePAM (KLONOPIN) 0.5 MG tablet TAKE 1 TABLET BY MOUTH AT BEDTIME 30 tablet 3   colchicine 0.6 MG tablet Take 1 tablet (0.6 mg total) by mouth daily. 90 tablet 1   DULoxetine (CYMBALTA) 20 MG capsule Take 1 capsule (20 mg total) by mouth daily. 90 capsule 4   EPINEPHrine (EPIPEN 2-PAK) 0.3 mg/0.3 mL IJ SOAJ injection Inject 0.3 mg into the muscle as needed for anaphylaxis. Geoffry Paradise INJECTION PROTOCOL 2 each 2   famotidine (PEPCID) 20 MG tablet Take 1 tablet (20 mg total) by mouth 2 (two) times daily. 180 tablet 1   Ferrous Sulfate (IRON) 28 MG TABS Take 1 tablet (28 mg total) by mouth every other day.     furosemide (LASIX) 20 MG tablet Take 1 tablet (20 mg total) by mouth daily. 90 tablet 4   gabapentin (NEURONTIN) 300 MG capsule TAKE 1 CAPSULE BY MOUTH ONCE DAILY AS NEEDED FOR  NERVE  PAIN  (ITCHING) 30 capsule 3   hydrALAZINE (APRESOLINE) 100 MG tablet Take 1 tablet (100 mg total) by mouth 3 (three) times daily. 90 tablet 3   levothyroxine (  SYNTHROID) 100 MCG tablet Take 1 tablet (100 mcg total) by mouth daily before breakfast. 90 tablet 4   loperamide (IMODIUM) 2 MG capsule Take 1 capsule (2 mg total) by mouth 3 (three) times daily as needed for diarrhea or loose stools. 30 capsule 0   mometasone (ELOCON) 0.1 % ointment Apply topically daily. Shower than ointment applied while still wet daily 135 g 1   Multiple Vitamin (MULTIVITAMIN ADULT PO) Take by mouth daily.     polyethylene glycol (MIRALAX / GLYCOLAX) 17 g packet Take 17 g by mouth daily.     Potassium 99 MG TABS Take 1 tablet (99 mg total) by mouth every Monday, Wednesday, and Friday. 330 tablet    Simethicone 180 MG CAPS Take 1  capsule by mouth daily as needed (indigestion).     spironolactone (ALDACTONE) 25 MG tablet Take 1 tablet (25 mg total) by mouth daily. 90 tablet 4   timolol (TIMOPTIC) 0.5 % ophthalmic solution Place 1 drop into both eyes every morning.     Facility-Administered Medications Prior to Visit  Medication Dose Route Frequency Provider Last Rate Last Admin   omalizumab Geoffry Paradise) prefilled syringe 150 mg  150 mg Subcutaneous Q28 days Kozlow, Alvira Philips, MD   150 mg at 12/01/22 4782     Per HPI unless specifically indicated in ROS section below Review of Systems  Objective:  BP 128/70   Pulse 65   Temp 97.6 F (36.4 C) (Temporal)   Ht 5' (1.524 m)   Wt 171 lb (77.6 kg)   SpO2 95%   BMI 33.40 kg/m   Wt Readings from Last 3 Encounters:  12/10/22 171 lb (77.6 kg)  11/17/22 168 lb 6 oz (76.4 kg)  10/18/22 170 lb (77.1 kg)      Physical Exam Vitals and nursing note reviewed.  Constitutional:      Appearance: She is not ill-appearing.  Cardiovascular:     Rate and Rhythm: Normal rate and regular rhythm.     Pulses: Normal pulses.     Heart sounds: Murmur (3/6 systolic throughout) heard.  Pulmonary:     Effort: Pulmonary effort is normal. No respiratory distress.     Breath sounds: Normal breath sounds. No wheezing, rhonchi or rales.  Musculoskeletal:     Right lower leg: Edema (tr) present.     Left lower leg: Edema (tr) present.  Neurological:     Mental Status: She is alert.  Psychiatric:        Mood and Affect: Mood normal.        Behavior: Behavior normal.       Results for orders placed or performed in visit on 11/17/22  POC COVID-19 BinaxNow  Result Value Ref Range   SARS Coronavirus 2 Ag Positive (A) Negative  POCT rapid strep A  Result Value Ref Range   Rapid Strep A Screen Negative Negative   *Note: Due to a large number of results and/or encounters for the requested time period, some results have not been displayed. A complete set of results can be found in Results  Review.   Lab Results  Component Value Date   TSH 4.81 10/06/2022   Lab Results  Component Value Date   NA 139 10/19/2022   CL 99 10/19/2022   K 4.4 10/19/2022   CO2 27 10/19/2022   BUN 19 10/19/2022   CREATININE 1.26 (H) 10/19/2022   EGFR 41 (L) 10/19/2022   CALCIUM 9.3 10/19/2022   PHOS 2.1 (L) 09/10/2021  ALBUMIN 4.2 08/06/2022   GLUCOSE 102 (H) 10/19/2022    Assessment & Plan:   Problem List Items Addressed This Visit     Hypertension - Primary    Chronic, best control to date on current regimen.  She notes mild pedal edema and dyspnea - see below.       Valvular heart disease    Pending rpt 2D echo next week.       Pruritic condition    Regularly seeing allergist (Kozlow) on omalizumab injections      Idiopathic urticaria    Regularly seeing allergist (Kozlow) on omalizumab injections      Exertional dyspnea    Discussed lasix dosing - doesn't note significant UOP on 20mg  lasix dose - will continue daily this regimen but increase dose to 40mg  at a time PRN leg swelling, dyspnea, weight gain, update with effect        No orders of the defined types were placed in this encounter.   No orders of the defined types were placed in this encounter.   Patient Instructions  Check with pharmacy about generic Eliquis (apixiban).  Take 2 lasix for the next 2 days Then when needed for leg swelling, may take 2 lasix at the same time.  Continue current medicines otherwise Return in 4 months for follow up visit   Follow up plan: Return in about 4 months (around 04/11/2023), or if symptoms worsen or fail to improve, for follow up visit.  Eustaquio Boyden, MD

## 2022-12-10 NOTE — Assessment & Plan Note (Signed)
Regularly seeing allergist (Kozlow) on omalizumab injections

## 2022-12-10 NOTE — Assessment & Plan Note (Signed)
Pending rpt 2D echo next week.

## 2022-12-10 NOTE — Assessment & Plan Note (Signed)
Chronic, best control to date on current regimen.  She notes mild pedal edema and dyspnea - see below.

## 2022-12-10 NOTE — Assessment & Plan Note (Addendum)
Regularly seeing allergist (Kozlow) on omalizumab injections

## 2022-12-10 NOTE — Assessment & Plan Note (Signed)
Discussed lasix dosing - doesn't note significant UOP on 20mg  lasix dose - will continue daily this regimen but increase dose to 40mg  at a time PRN leg swelling, dyspnea, weight gain, update with effect

## 2022-12-11 ENCOUNTER — Other Ambulatory Visit: Payer: Self-pay | Admitting: Family Medicine

## 2022-12-13 ENCOUNTER — Ambulatory Visit (INDEPENDENT_AMBULATORY_CARE_PROVIDER_SITE_OTHER): Payer: PPO

## 2022-12-13 DIAGNOSIS — I4819 Other persistent atrial fibrillation: Secondary | ICD-10-CM | POA: Diagnosis not present

## 2022-12-13 DIAGNOSIS — R0602 Shortness of breath: Secondary | ICD-10-CM | POA: Diagnosis not present

## 2022-12-13 DIAGNOSIS — I10 Essential (primary) hypertension: Secondary | ICD-10-CM

## 2022-12-13 LAB — ECHOCARDIOGRAM COMPLETE
AR max vel: 1.13 cm2
AV Area VTI: 1.2 cm2
AV Area mean vel: 1.05 cm2
AV Mean grad: 17.4 mmHg
AV Peak grad: 30.1 mmHg
AV Vena cont: 0.39 cm
Ao pk vel: 2.74 m/s
Area-P 1/2: 5.25 cm2
MV M vel: 5.51 m/s
MV Peak grad: 121.4 mmHg
P 1/2 time: 504 msec
S' Lateral: 2.03 cm

## 2022-12-17 MED ORDER — GABAPENTIN 300 MG PO CAPS: ORAL_CAPSULE | ORAL | 0 refills | Status: DC

## 2022-12-17 NOTE — Addendum Note (Signed)
Addended by: Eustaquio Boyden on: 12/17/2022 07:39 AM   Modules accepted: Orders

## 2022-12-17 NOTE — Addendum Note (Signed)
Addended by: Eustaquio Boyden on: 12/17/2022 07:40 AM   Modules accepted: Orders

## 2022-12-20 ENCOUNTER — Encounter (HOSPITAL_BASED_OUTPATIENT_CLINIC_OR_DEPARTMENT_OTHER): Payer: Self-pay

## 2022-12-22 DIAGNOSIS — G4733 Obstructive sleep apnea (adult) (pediatric): Secondary | ICD-10-CM | POA: Diagnosis not present

## 2022-12-24 ENCOUNTER — Telehealth: Payer: Self-pay

## 2022-12-24 NOTE — Telephone Encounter (Signed)
Lm for patient to request that she bring SD card to upcoming appt.

## 2022-12-28 ENCOUNTER — Ambulatory Visit: Payer: PPO | Admitting: Adult Health

## 2022-12-28 ENCOUNTER — Telehealth: Payer: Self-pay

## 2022-12-28 ENCOUNTER — Encounter: Payer: Self-pay | Admitting: Adult Health

## 2022-12-28 VITALS — BP 124/68 | HR 50 | Temp 98.3°F | Ht 60.0 in | Wt 171.8 lb

## 2022-12-28 DIAGNOSIS — J9691 Respiratory failure, unspecified with hypoxia: Secondary | ICD-10-CM

## 2022-12-28 DIAGNOSIS — G4733 Obstructive sleep apnea (adult) (pediatric): Secondary | ICD-10-CM

## 2022-12-28 NOTE — Progress Notes (Signed)
@Patient  ID: Brooke Beasley, female    DOB: 04/15/1931, 89 y.o.   MRN: [id number]  Chief Complaint  Patient presents with   Follow-up    Referring provider: Eustaquio Boyden, MD  HPI: 89 year old female seen for sleep consult November 13, 2021 for nocturnal hypoxemia and witnessed apneic events.  Found to have mild obstructive sleep apnea hospitalization May 2023 for L5-S1 discitis/epidural abscess (Proteus/Citrobacter) requiring prolonged antibiotics Medical history significant for C. difficile, A-fib, congestive heart failure, aortic valve stenosis and scoliosis   TEST/EVENTS :  3/31, follow-up MRI with contrast showed a 4 mm epidural phlegmon without drainable component. 3/31, underwent disc aspiration that grew Enterococcus.  Following CT scan showed decrease in the size of seroma. 4/2, started endocarditis coverage with Unasyn 4/3, TEE negative for endocarditis antibiotics adjusted by ID, PICC line placed by vascular access team 4/7, MRI lumbar and sacral spine were repeated because of persistent pain and elevated inflammatory markers.  8 showed continued discitis osteomyelitis and L5-S1 level.  It also showed 6 cm size collection posterior to the rectum likely an abscess. 4/10: CT-guided aspiration of pelvic fluid collection posterior to the rectum.   3/30-4/13>> hospitalization for L5-S1 discitis/abscess posterior to rectum-cultures Proteus/Citrobacter-discharged on IV Zosyn-stop date 5/22.  New onset A-fib-started on anticoagulation. 5/03>> outpatient MRI-resolution of L5-S1 epidural abscess, persistent L5-S1 discitis/osteomyelitis. 5/11>> due to persistent leukocytosis-intermittent fevers-PICC line removed on 5/5-transition to Augmentin by ID in the outpatient setting. 5/14>> admit for hyponatremia. 5/15>> no response to IVF-renal consult-to ICU for 3% NaCl.   5/17 >> transfer back to [facility]-off 3% NaCl since 5/16.   11/2021 Split study -during baseline, RDI was 15/hour-CPAP  titrated to 11 cm Since she already has BiPAP, can be set at 11/8 cm   12/28/2022 Follow up OSA  Patient returns for 1 year follow-up.  Patient had a critical illness for a prolonged hospitalization in May 2023.  She was noticed to have nocturnal hypoxemia and witnessed apneic events.  She was started on Luna BiPAP at discharge.  She was set up for a split-night sleep study that was completed August 2023.  This showed optimal control on 11 cm H2O on CPAP.  Since she already had a Luna BiPAP we set her at 11/8 cm.  Patient says she wears her BiPAP every night.  Feels that she is benefiting from it with decreased daytime sleepiness.  BiPAP shows excellent compliance with daily average usage at 10 hours.  AHI 10.6.  Patient's pressures are at 16/8.  Patient thinks that her granddaughter may have been pushing buttons on her machine.  Says overall she has been doing okay.  Is frustrated that she has chronic pruritus/atopic dermatitis.  Is followed by allergy and is on Xolair injections. Does not seem to be working very well.     Allergies  Allergen Reactions   Ace Inhibitors Cough   Amlodipine Nausea Only and Other (See Comments)    Stomach pains--"feel like my insides are on fire"   Carvedilol Other (See Comments)    fatigue   Chlorthalidone Other (See Comments)    Severe hyponatremia   Montelukast Other (See Comments)    Slurred speech; really drowsy   Other Other (See Comments)    No seeds or corn because of IBS   Risedronate Sodium Other (See Comments)    REACTION: joint pain   Simvastatin Other (See Comments)    REACTION: the full 20 mg pill causes leg pain- can tol 10 mg   Alendronate Sodium  Palpitations   Influenza Vaccines Rash and Other (See Comments)    Led to mental breakdown in 1960s    Losartan Other (See Comments)    Cr bumped   Paroxetine Hcl Other (See Comments)    Not effective - felt ill on this medicine   Silenor [Doxepin Hcl] Palpitations    Hallucinations and racing  heart   Sulfonamide Derivatives Rash   Toprol Xl [Metoprolol] Diarrhea    Diarrhea and weakness    Immunization History  Administered Date(s) Administered   PFIZER(Purple Top)SARS-COV-2 Vaccination 08/10/2019, 08/31/2019   Pneumococcal Conjugate-13 06/08/2013   Pneumococcal Polysaccharide-23 04/26/2002   Td 04/26/1998, 10/22/2008   Zoster, Live 05/11/2010    Past Medical History:  Diagnosis Date   Anxiety    C. difficile diarrhea 07/11/2018   S/p multiple oral vanc treatments (course and tapers) and completed Zinplava monoclonal Ab treatment (05/10/2019) Fecal transplant on hold during COVID pandemic   CAP (community acquired pneumonia) 12/22/2017   Carotid stenosis 10/18/2014   R 1-39%, L 40-59%, rpt 1 yr (09/2014)    CKD (chronic kidney disease) stage 3, GFR 30-59 ml/min (HCC) 08/02/2014   Closed fracture of right orbit (HCC) 02/10/2021   Clostridioides difficile infection    hx 2020   COVID-19 virus infection 10/13/2020   Degenerative disc disease    LS   Depression    nervous breakdonw in 1964-out of work for a year   Diverticulosis    severe by colonoscopy   Family history of adverse reaction to anesthesia    n/v   GERD (gastroesophageal reflux disease)    Glaucoma    Heart murmur 08/2011   mitral regurge - on echo    History of hiatal hernia    History of shingles    HTN (hypertension)    Hyperlipidemia    Hypertension    Hypothyroidism    IBS 11/02/2006   Influenza A 04/20/2022   Intestinal bacterial overgrowth    In small colon   Left shoulder pain 11/04/2014   Lower GI bleed 12/2020   thought diverticular complicated by ABLA with syncope and orbital fracture s/p hospitalization   Maxillary fracture (HCC) 04/08/2012   Osteoarthritis 2016   ([facility])   Osteoporosis    dexa 2011   Perirectal abscess 08/03/2021   CT guided aspiration of abscess grow Proteums mirabilis and Citrobacter koseri 07/2021, currently receiving IV Zosyn planned total 6 wks.    Pseudogout 2016   shoulders ([facility])   Strep pharyngitis 04/20/2022    Tobacco History: Social History   Tobacco Use  Smoking Status Never  Smokeless Tobacco Never   Counseling given: Not Answered   Outpatient Medications Prior to Visit  Medication Sig Dispense Refill   apixaban (ELIQUIS) 5 MG TABS tablet Take 1 tablet (5 mg total) by mouth 2 (two) times daily. 60 tablet 11   Ascorbic Acid (VITAMIN C PO) Take by mouth.     bimatoprost (LUMIGAN) 0.01 % SOLN Place 1 drop into both eyes at bedtime.     carvedilol (COREG) 3.125 MG tablet Take 1 tablet (3.125 mg total) by mouth 2 (two) times daily with a meal. 60 tablet 6   cetirizine (ZYRTEC) 10 MG tablet Take 1 tablet by mouth once daily 90 tablet 0   cholecalciferol (VITAMIN D) 1000 units tablet Take 1,000 Units by mouth daily.     clonazePAM (KLONOPIN) 0.5 MG tablet TAKE 1 TABLET BY MOUTH AT BEDTIME 30 tablet 3   colchicine 0.6 MG tablet Take 1  tablet (0.6 mg total) by mouth daily. 90 tablet 1   DULoxetine (CYMBALTA) 20 MG capsule Take 1 capsule (20 mg total) by mouth daily. 90 capsule 4   EPINEPHrine (EPIPEN 2-PAK) 0.3 mg/0.3 mL IJ SOAJ injection Inject 0.3 mg into the muscle as needed for anaphylaxis. Geoffry Paradise INJECTION PROTOCOL 2 each 2   famotidine (PEPCID) 20 MG tablet Take 1 tablet (20 mg total) by mouth 2 (two) times daily. 180 tablet 1   Ferrous Sulfate (IRON) 28 MG TABS Take 1 tablet (28 mg total) by mouth every other day.     furosemide (LASIX) 20 MG tablet Take 1 tablet (20 mg total) by mouth daily. 90 tablet 4   gabapentin (NEURONTIN) 300 MG capsule TAKE 1 CAPSULE BY MOUTH ONCE DAILY AS NEEDED FOR  NERVE  PAIN  (ITCHING) 90 capsule 0   hydrALAZINE (APRESOLINE) 100 MG tablet Take 1 tablet (100 mg total) by mouth 3 (three) times daily. 90 tablet 3   levothyroxine (SYNTHROID) 100 MCG tablet Take 1 tablet (100 mcg total) by mouth daily before breakfast. 90 tablet 4   loperamide (IMODIUM) 2 MG capsule Take 1 capsule (2 mg total)  by mouth 3 (three) times daily as needed for diarrhea or loose stools. 30 capsule 0   mometasone (ELOCON) 0.1 % ointment Apply topically daily. Shower than ointment applied while still wet daily 135 g 1   Multiple Vitamin (MULTIVITAMIN ADULT PO) Take by mouth daily.     polyethylene glycol (MIRALAX / GLYCOLAX) 17 g packet Take 17 g by mouth daily.     Potassium 99 MG TABS Take 1 tablet (99 mg total) by mouth every Monday, Wednesday, and Friday. 330 tablet    Simethicone 180 MG CAPS Take 1 capsule by mouth daily as needed (indigestion).     spironolactone (ALDACTONE) 25 MG tablet Take 1 tablet (25 mg total) by mouth daily. 90 tablet 4   timolol (TIMOPTIC) 0.5 % ophthalmic solution Place 1 drop into both eyes every morning.     amLODipine (NORVASC) 2.5 MG tablet Take 1 tablet (2.5 mg total) by mouth 2 (two) times daily. (Patient not taking: Reported on 12/28/2022) 180 tablet 3   Facility-Administered Medications Prior to Visit  Medication Dose Route Frequency Provider Last Rate Last Admin   omalizumab Geoffry Paradise) prefilled syringe 150 mg  150 mg Subcutaneous Q28 days Jessica Priest, MD   150 mg at 12/01/22 4401     Review of Systems:   Constitutional:   No  weight loss, night sweats,  Fevers, chills,  +fatigue, or  lassitude.  HEENT:   No headaches,  Difficulty swallowing,  Tooth/dental problems, or  Sore throat,                No sneezing, itching, ear ache, nasal congestion, post nasal drip,   CV:  No chest pain,  Orthopnea, PND, swelling in lower extremities, anasarca, dizziness, palpitations, syncope.   GI  No heartburn, indigestion, abdominal pain, nausea, vomiting, diarrhea, change in bowel habits, loss of appetite, bloody stools.   Resp:   No wheezing.  No chest wall deformity  Skin: no rash or lesions.  GU: no dysuria, change in color of urine, no urgency or frequency.  No flank pain, no hematuria   MS:  No joint pain or swelling.  No decreased range of motion.  No back  pain.    Physical Exam  BP 124/68 (BP Location: Left Arm, Cuff Size: Normal)   Pulse (!) 50  Temp 98.3 F (36.8 C) (Temporal)   Ht 5' (1.524 m)   Wt 171 lb 12.8 oz (77.9 kg)   SpO2 95%   BMI 33.55 kg/m   GEN: A/Ox3; pleasant , NAD, well nourished    HEENT:  [place]/AT,   NOSE-clear, THROAT-clear, no lesions, no postnasal drip or exudate noted.   NECK:  Supple w/ fair ROM; no JVD; normal carotid impulses w/o bruits; no thyromegaly or nodules palpated; no lymphadenopathy.    RESP  Clear  P & A; w/o, wheezes/ rales/ or rhonchi. no accessory muscle use, no dullness to percussion  CARD:  RRR, no m/r/g, no peripheral edema, pulses intact, no cyanosis or clubbing.  GI:   Soft & nt; nml bowel sounds; no organomegaly or masses detected.   Musco: Warm bil, no deformities or joint swelling noted.   Neuro: alert, no focal deficits noted.    Skin: Warm, no lesions or rashes    Lab Results:  CBC    Imaging: ECHOCARDIOGRAM COMPLETE  Result Date: 12/13/2022    ECHOCARDIOGRAM REPORT   Patient Name:   [facility] [place] Date of Exam: 12/13/2022 Medical Rec #:  [id number]              Height:       60.0 in Accession #:    [id number]             Weight:       171.0 lb Date of Birth:  June 10, 1930              BSA:          1.746 m Patient Age:    89 years               BP:           130/75 mmHg Patient Gender: F                      HR:           64 bpm. Exam Location:  Outpatient Procedure: 2D Echo, 3D Echo, Cardiac Doppler, Color Doppler and Strain Analysis Indications:    Dyspnea  History:        Patient has prior history of Echocardiogram examinations, most                 recent 07/24/2021. Arrythmias:PAC, Atrial Flutter and Atrial                 Fibrillation; Risk Factors:Hypertension, Non-Smoker and                 Dyslipidemia.  Sonographer:    Jeryl Columbia RDCS Referring Phys: [id number] TIFFANY [place] IMPRESSIONS  1. Left ventricular ejection fraction, by estimation, is 65 to 70%.  The left ventricle has normal function. The left ventricle has no regional wall motion abnormalities. There is mild left ventricular hypertrophy. Left ventricular diastolic parameters are indeterminate.  2. Right ventricular systolic function is mildly reduced. The right ventricular size is mildly enlarged. There is normal pulmonary artery systolic pressure. The estimated right ventricular systolic pressure is 30.0 mmHg.  3. Left atrial size was mildly dilated.  4. Right atrial size was mildly dilated.  5. Mild posterior leaflet prolapse. MR color Doppler splay artifact may indicate underestimation of MR. The mitral valve is degenerative. Mild to moderate mitral valve regurgitation. No evidence of mitral stenosis.  6. The aortic valve is tricuspid. There is moderate calcification of the aortic valve. There is  moderate thickening of the aortic valve. Aortic valve regurgitation is trivial. Mild to moderate aortic valve stenosis. Aortic valve area, by VTI measures 1.20 cm. Aortic valve mean gradient measures 17.4 mmHg. Aortic valve Vmax measures 2.74 m/s.  7. There is borderline dilatation of the ascending aorta, measuring 40 mm.  8. The inferior vena cava is normal in size with greater than 50% respiratory variability, suggesting right atrial pressure of 3 mmHg. FINDINGS  Left Ventricle: Left ventricular ejection fraction, by estimation, is 65 to 70%. The left ventricle has normal function. The left ventricle has no regional wall motion abnormalities. The left ventricular internal cavity size was normal in size. There is  mild left ventricular hypertrophy. Left ventricular diastolic parameters are indeterminate. Right Ventricle: The right ventricular size is mildly enlarged. No increase in right ventricular wall thickness. Right ventricular systolic function is mildly reduced. There is normal pulmonary artery systolic pressure. The tricuspid regurgitant velocity  is 2.60 m/s, and with an assumed right atrial pressure  of 3 mmHg, the estimated right ventricular systolic pressure is 30.0 mmHg. Left Atrium: Left atrial size was mildly dilated. Right Atrium: Right atrial size was mildly dilated. Pericardium: There is no evidence of pericardial effusion. Presence of epicardial fat layer. Mitral Valve: Mild posterior leaflet prolapse. MR color Doppler splay artifact may indicate underestimation of MR. The mitral valve is degenerative in appearance. Mild to moderate mitral valve regurgitation. No evidence of mitral valve stenosis. Tricuspid Valve: The tricuspid valve is normal in structure. Tricuspid valve regurgitation is mild . No evidence of tricuspid stenosis. Aortic Valve: The aortic valve is tricuspid. There is moderate calcification of the aortic valve. There is moderate thickening of the aortic valve. Aortic valve regurgitation is trivial. Aortic regurgitation PHT measures 504 msec. Mild to moderate aortic  stenosis is present. Aortic valve mean gradient measures 17.4 mmHg. Aortic valve peak gradient measures 30.1 mmHg. Aortic valve area, by VTI measures 1.20 cm. Pulmonic Valve: The pulmonic valve was normal in structure. Pulmonic valve regurgitation is mild. No evidence of pulmonic stenosis. Aorta: The aortic root is normal in size and structure. There is borderline dilatation of the ascending aorta, measuring 40 mm. Venous: The inferior vena cava is normal in size with greater than 50% respiratory variability, suggesting right atrial pressure of 3 mmHg. IAS/Shunts: No atrial level shunt detected by color flow Doppler.  LEFT VENTRICLE PLAX 2D LVIDd:         3.89 cm   Diastology LVIDs:         2.03 cm   LV e' medial:    6.96 cm/s LV PW:         1.03 cm   LV E/e' medial:  18.8 LV IVS:        1.01 cm   LV e' lateral:   7.29 cm/s LVOT diam:     2.00 cm   LV E/e' lateral: 17.9 LV SV:         76 LV SV Index:   44 LVOT Area:     3.14 cm  RIGHT VENTRICLE RV Basal diam:  4.98 cm RV Mid diam:    3.67 cm RV S prime:     9.68 cm/s TAPSE  (M-mode): 1.4 cm LEFT ATRIUM             Index        RIGHT ATRIUM           Index LA diam:        4.70 cm 2.69  cm/m   RA Area:     19.10 cm LA Vol (A2C):   68.7 ml 39.34 ml/m  RA Volume:   57.20 ml  32.75 ml/m LA Vol (A4C):   44.7 ml 25.60 ml/m LA Biplane Vol: 60.6 ml 34.70 ml/m  AORTIC VALVE AV Area (Vmax):    1.13 cm AV Area (Vmean):   1.05 cm AV Area (VTI):     1.20 cm AV Vmax:           274.31 cm/s AV Vmean:          190.685 cm/s AV VTI:            0.636 m AV Peak Grad:      30.1 mmHg AV Mean Grad:      17.4 mmHg LVOT Vmax:         98.50 cm/s LVOT Vmean:        63.900 cm/s LVOT VTI:          0.242 m LVOT/AV VTI ratio: 0.38 AI PHT:            504 msec AR Vena Contracta: 0.39 cm  AORTA Ao Root diam: 3.40 cm Ao Asc diam:  4.00 cm MITRAL VALVE                TRICUSPID VALVE MV Area (PHT): 5.25 cm     TR Peak grad:   27.0 mmHg MV Decel Time: 145 msec     TR Vmax:        260.00 cm/s MR Peak grad: 121.4 mmHg MR Mean grad: 80.0 mmHg     SHUNTS MR Vmax:      551.00 cm/s   Systemic VTI:  0.24 m MR Vmean:     427.0 cm/s    Systemic Diam: 2.00 cm MV E velocity: 130.50 cm/s MV A velocity: 39.00 cm/s MV E/A ratio:  3.35 Weston Brass MD Electronically signed by Weston Brass MD Signature Date/Time: 12/13/2022/7:34:28 PM    Final     omalizumab Geoffry Paradise) prefilled syringe 150 mg     Date Action Dose Route User   12/01/2022 0951 Given 150 mg Subcutaneous (Left Arm) Florence Canner, [facility]   11/03/2022 0865 Given 150 mg Subcutaneous (Left Arm) Dub Mikes, LPN          Latest Ref Rng & Units 09/14/2021    2:00 PM  PFT Results  FVC-Pre L 1.87   FVC-Predicted Pre % 98   Pre FEV1/FVC % % 72   FEV1-Pre L 1.34   FEV1-Predicted Pre % 97     No results found for: "NITRICOXIDE"      Assessment & Plan:   OSA (obstructive sleep apnea) Mild obstructive sleep apnea- set pt settings to 11/8 . Download in 1 month.  Will check overnight oximetry test on BiPAP on room air.  If normal will discontinue  oxygen  Plan  Patient Instructions  Continue on BIPAP At bedtime with oxygen  Continue on Oxygen 2l/m At bedtime with BIPAP  Check Overnight oximetry on BIPAP on room air .  Follow up in 6 months and As needed   Please contact office for sooner follow up if symptoms do not improve or worsen or seek emergency care       Rubye Oaks, NP 12/28/2022

## 2022-12-28 NOTE — Patient Instructions (Addendum)
Continue on BIPAP At bedtime with oxygen  Continue on Oxygen 2l/m At bedtime with BIPAP  Check Overnight oximetry on BIPAP on room air .  Follow up in 6 months and As needed   Please contact office for sooner follow up if symptoms do not improve or worsen or seek emergency care

## 2022-12-28 NOTE — Assessment & Plan Note (Addendum)
Mild obstructive sleep apnea- set pt settings to 11/8 . Download in 1 month.  Will check overnight oximetry test on BiPAP on room air.  If normal will discontinue oxygen  Plan  Patient Instructions  Continue on BIPAP At bedtime with oxygen  Continue on Oxygen 2l/m At bedtime with BIPAP  Check Overnight oximetry on BIPAP on room air .  Follow up in 6 months and As needed   Please contact office for sooner follow up if symptoms do not improve or worsen or seek emergency care

## 2022-12-28 NOTE — Telephone Encounter (Addendum)
Patient was in office today. We were unable to unable to obtain a download from SD card, however Brad with [employer] was able to send 30 day report.  Tammy has reviewed report, it seems like pressure has been adjusted. Pressure needs to be changed back to Max Ipap 11 min EPAP 8. She would also like an ONO on bipap without oxygen. Patient left SD card in office.  Patient's daughter, Rebecca(DPR) is aware of above message/recommendations and voiced her understanding.  She will come by today to pickup SD card.  ONO on bipap without oxygen ordered.  Order placed to [employer] to change settings.  Nothing further needed.

## 2022-12-28 NOTE — Telephone Encounter (Signed)
Spoke to patient and requested that she bring SD card to appt.  She voiced her understanding.  Nothing further needed.

## 2022-12-28 NOTE — Progress Notes (Signed)
Reviewed and agree with assessment/plan.   Coralyn Helling, MD [facility] Pulmonary/Critical Care 12/28/2022, 4:15 PM Pager:  [phone]

## 2022-12-29 ENCOUNTER — Ambulatory Visit (INDEPENDENT_AMBULATORY_CARE_PROVIDER_SITE_OTHER): Payer: PPO | Admitting: *Deleted

## 2022-12-29 DIAGNOSIS — L501 Idiopathic urticaria: Secondary | ICD-10-CM

## 2023-01-02 DIAGNOSIS — J9691 Respiratory failure, unspecified with hypoxia: Secondary | ICD-10-CM

## 2023-01-03 ENCOUNTER — Telehealth (HOSPITAL_BASED_OUTPATIENT_CLINIC_OR_DEPARTMENT_OTHER): Payer: Self-pay

## 2023-01-03 DIAGNOSIS — R825 Elevated urine levels of drugs, medicaments and biological substances: Secondary | ICD-10-CM

## 2023-01-03 DIAGNOSIS — I10 Essential (primary) hypertension: Secondary | ICD-10-CM

## 2023-01-03 NOTE — Telephone Encounter (Addendum)
Left message for patient to call back   ----- Message from [facility] sent at 12/30/2022  3:31 PM EDT ----- Kidney function mildly abnormal but stable.  Catecholamines are abnormal.  Please get 24 hour urine catecholamines to determine if this is accurate.

## 2023-01-03 NOTE — Telephone Encounter (Signed)
Patient returned call to office, transferred from call center. Patient agreeable to urine test, ordered.

## 2023-01-03 NOTE — Telephone Encounter (Signed)
Can we get download so I can make sure it went back to previous setting 11/8 .

## 2023-01-03 NOTE — Addendum Note (Signed)
Addended by: Marlene Lard on: 01/03/2023 11:41 AM   Modules accepted: Orders

## 2023-01-03 NOTE — Telephone Encounter (Signed)
FYI

## 2023-01-05 DIAGNOSIS — I10 Essential (primary) hypertension: Secondary | ICD-10-CM | POA: Diagnosis not present

## 2023-01-05 DIAGNOSIS — I5032 Chronic diastolic (congestive) heart failure: Secondary | ICD-10-CM | POA: Diagnosis not present

## 2023-01-05 DIAGNOSIS — R825 Elevated urine levels of drugs, medicaments and biological substances: Secondary | ICD-10-CM | POA: Diagnosis not present

## 2023-01-05 NOTE — Telephone Encounter (Signed)
Routing to [employer] as an [employer]

## 2023-01-05 NOTE — Telephone Encounter (Signed)
Did she sleep with BIPAP on room air or off BIPAP on room air ?  As soon as I get results can take a look to see how she is doing .  If dizziness does not resolve let us know  Please contact office for sooner follow up if symptoms do not improve or worsen or seek emergency care

## 2023-01-05 NOTE — Telephone Encounter (Signed)
  Oxygen level is 94. Blood pressure is ok. Thanks for checking on me.

## 2023-01-06 NOTE — Telephone Encounter (Signed)
Patient is feeling better today  Continue on BIPAP At bedtime  with O2 .   Can we get ONO results ?

## 2023-01-06 NOTE — Telephone Encounter (Signed)
Spoke to Bell with [employer] and requested ONO. He stated that he would fax report to our office.

## 2023-01-07 NOTE — Telephone Encounter (Signed)
Received below email from [place] with [employer]   Alm Bustard, I just received an update. We reached out to [employer] who sent the ONO out. They have been trying to get in contact with the patient to get them to send it back so we can upload results, but the patient has not picked up or replied. The last attempt that was made was a day ago.  Mickeal Needy Patient Liaison  Routing to Tammy to make aware.

## 2023-01-10 DIAGNOSIS — J9601 Acute respiratory failure with hypoxia: Secondary | ICD-10-CM | POA: Diagnosis not present

## 2023-01-10 NOTE — Telephone Encounter (Signed)
Email sent to Cecilio Asper with [employer] to make him aware that device has been mailed back.

## 2023-01-12 NOTE — Telephone Encounter (Signed)
Received email from [facility] with [employer]. He stated that ONO should have been faxed to our office. I have not received it at the [place] office.  Tammy, have you received it in [place]?

## 2023-01-14 NOTE — Telephone Encounter (Signed)
Received email from Happy with [employer]. He stated that [employer] re faxed results to [facility] office.

## 2023-01-14 NOTE — Telephone Encounter (Signed)
Chiropractor and requested that he fax ONO to [employer].

## 2023-01-17 ENCOUNTER — Encounter: Payer: Self-pay | Admitting: Family Medicine

## 2023-01-17 DIAGNOSIS — I340 Nonrheumatic mitral (valve) insufficiency: Secondary | ICD-10-CM | POA: Insufficient documentation

## 2023-01-18 ENCOUNTER — Telehealth: Payer: Self-pay | Admitting: *Deleted

## 2023-01-18 ENCOUNTER — Other Ambulatory Visit: Payer: Self-pay | Admitting: Family Medicine

## 2023-01-18 DIAGNOSIS — F418 Other specified anxiety disorders: Secondary | ICD-10-CM

## 2023-01-18 NOTE — Telephone Encounter (Signed)
Sent message to [place] at [place] office regarding ONO fax that was sent previously.  She stated she does not have it that Mitch from [employer] was faxing it to [place].  I called and spoke with Brad with [employer] and he said that [employer] did not have a copy of the ONO and he would message Elease Hashimoto with [employer] to request a copy from [employer] to be fax to [employer] and to [place] at [phone].  Will await fax.

## 2023-01-18 NOTE — Telephone Encounter (Signed)
ONO shows no significant desats on BIPAP  O2 saturations less than 88% for 32 seconds, average O2 saturation was 94% on BiPAP on room air  May discontinue O2  Continue on BiPAP at bedtime Patient aware

## 2023-01-19 NOTE — Telephone Encounter (Signed)
Last office visit 12/10/2022 for Medical Management of Chronic Issues.  Last refilled 09/21/2022 for #30 with 3 refills.  Next Appt: 04/22/2023

## 2023-01-20 ENCOUNTER — Telehealth (HOSPITAL_BASED_OUTPATIENT_CLINIC_OR_DEPARTMENT_OTHER): Payer: Self-pay

## 2023-01-20 NOTE — Telephone Encounter (Addendum)
Called patient in attempt to reschedule, no answer, unable to leave message. Will leave patient scheduled as is.    ----- Message from Alver Sorrow sent at 01/20/2023 12:28 PM EDT ----- Unfortunately I don't have Hypertension Clinic openings for awhile.   If she is willing/able to switch to [facility] HTN clinic day 10/2 that would be good. But if she can't, can leave as is. ----- Message ----- From: Marlene Lard, RN Sent: 01/20/2023  11:08 AM EDT To: Alver Sorrow, NP  HTN pt. Scheduled 9/27 by non htn scheduler on 7/24- HTN in notes, added to spreadsheet, do you want me to reschedule her

## 2023-01-21 ENCOUNTER — Ambulatory Visit (HOSPITAL_BASED_OUTPATIENT_CLINIC_OR_DEPARTMENT_OTHER): Payer: PPO | Admitting: Family

## 2023-01-21 ENCOUNTER — Encounter (HOSPITAL_BASED_OUTPATIENT_CLINIC_OR_DEPARTMENT_OTHER): Payer: Self-pay

## 2023-01-21 ENCOUNTER — Encounter (HOSPITAL_BASED_OUTPATIENT_CLINIC_OR_DEPARTMENT_OTHER): Payer: Self-pay | Admitting: Family

## 2023-01-21 VITALS — BP 158/83 | HR 77 | Ht 60.0 in | Wt 172.0 lb

## 2023-01-21 DIAGNOSIS — I350 Nonrheumatic aortic (valve) stenosis: Secondary | ICD-10-CM | POA: Diagnosis not present

## 2023-01-21 DIAGNOSIS — D6859 Other primary thrombophilia: Secondary | ICD-10-CM | POA: Diagnosis not present

## 2023-01-21 DIAGNOSIS — I4819 Other persistent atrial fibrillation: Secondary | ICD-10-CM

## 2023-01-21 DIAGNOSIS — I701 Atherosclerosis of renal artery: Secondary | ICD-10-CM

## 2023-01-21 DIAGNOSIS — I5032 Chronic diastolic (congestive) heart failure: Secondary | ICD-10-CM | POA: Diagnosis not present

## 2023-01-21 DIAGNOSIS — I10 Essential (primary) hypertension: Secondary | ICD-10-CM | POA: Diagnosis not present

## 2023-01-21 MED ORDER — DOXAZOSIN MESYLATE 2 MG PO TABS: 2.0000 mg | ORAL_TABLET | Freq: Every day | ORAL | 3 refills | Status: DC

## 2023-01-21 MED ORDER — HYDRALAZINE HCL 50 MG PO TABS: 50.0000 mg | ORAL_TABLET | Freq: Three times a day (TID) | ORAL | 3 refills | Status: DC

## 2023-01-21 NOTE — Telephone Encounter (Signed)
For appointment today

## 2023-01-21 NOTE — Patient Instructions (Signed)
Medication Instructions:   REDUCE Hydralazine to 50mg  (half tablet) three times per day Adjust times to 8 AM, 2 PM, 8:30PM  START Doxazosin 2mg  every evening   Follow-Up: In 2-3 months in Hypertension Clinic

## 2023-01-21 NOTE — Progress Notes (Unsigned)
Advanced Hypertension Clinic Assessment:    Date:  01/21/2023   ID:  Brooke Beasley, DOB 1931-02-05, MRN [id number]  PCP:  Eustaquio Boyden, MD  Cardiologist:  None  Nephrologist:  Referring MD: Eustaquio Boyden, MD   CC: Hypertension  History of Present Illness:    Brooke Beasley is a 89 y.o. female with a hx of persistent atrial fibrillation/flutter s/p DCCV, HFpEF, HTN, HLD, CKD3, mild aortic stenosis, OSA on CPAP, anxiety here to follow up in the Advanced Hypertension Clinic.   Established with Dr. Duke Salvia 10/18/22. Coreg previously reduced due to bradycardia. She noted labile BP particularly since rectal prolapse surgery the year prior at which time 1 foot of intestine was removed and postop infection required abx via PICC. She noted more labile BP since addition of Hydralazine. Amlodipine was increased to 2.5mg  BID.  She noted exertional dyspnea and chest pain with subsequent myoview 10/2022 low risk with no ischemia. Echo 11/2022 normal LVEF, moderate MR, mild to moderate AS which was unchanged. CT angio abd 10/2022 patent single right renal artery with mild osital stenosis. There was chronic occlusion of main left renal artery with accessory renal artery which was patent. No adrenal adenomas. Plasma renin aldosterone and 24h urin catecholamines were normal.   Presents today for follow up. She has self discontinued Amlodipine due to itching. She notes persistent itching despite discontinuation of Amlodipine. Notes it did decrease her itching some. Follows with [facility] for idiopathic urticaria. She is on Omalizumab, but due to cost an minimal improvement may not proceed with further injections. BP log from home with average BP over the last month 137/79 mmHg. Taking BP medications 8am, 12pm, 5:30pm. She does take her Clonazepam and Eliquis at bedtime around 8:30 pm.  Previous antihypertensives: ACE-I Carvedilol (previously reduced due to bradycardia,  fatigue) Chlorthalidone Losartan metoprolol Atenolol Chlorthalidone Hydrochlorothiazide Irbesartan Imdur Hydrochlorothiazide-triamterene Amlodipine - itching   Past Medical History:  Diagnosis Date   Anxiety    C. difficile diarrhea 07/11/2018   S/p multiple oral vanc treatments (course and tapers) and completed Zinplava monoclonal Ab treatment (05/10/2019) Fecal transplant on hold during COVID pandemic   CAP (community acquired pneumonia) 12/22/2017   Carotid stenosis 10/18/2014   R 1-39%, L 40-59%, rpt 1 yr (09/2014)    CKD (chronic kidney disease) stage 3, GFR 30-59 ml/min (HCC) 08/02/2014   Closed fracture of right orbit (HCC) 02/10/2021   Clostridioides difficile infection    hx 2020   COVID-19 virus infection 10/13/2020   Degenerative disc disease    LS   Depression    nervous breakdonw in 1964-out of work for a year   Diverticulosis    severe by colonoscopy   Family history of adverse reaction to anesthesia    n/v   GERD (gastroesophageal reflux disease)    Glaucoma    Heart murmur 08/2011   mitral regurge - on echo    History of hiatal hernia    History of shingles    HTN (hypertension)    Hyperlipidemia    Hypertension    Hypothyroidism    IBS 11/02/2006   Influenza A 04/20/2022   Intestinal bacterial overgrowth    In small colon   Left shoulder pain 11/04/2014   Lower GI bleed 12/2020   thought diverticular complicated by ABLA with syncope and orbital fracture s/p hospitalization   Maxillary fracture (HCC) 04/08/2012   Osteoarthritis 2016   [place])   Osteoporosis    dexa 2011   Perirectal abscess  08/03/2021   CT guided aspiration of abscess grow Proteums mirabilis and Citrobacter koseri 07/2021, currently receiving IV Zosyn planned total 6 wks.   Pseudogout 2016   shoulders ([place])   Strep pharyngitis 04/20/2022    Past Surgical History:  Procedure Laterality Date   barium enema  2012   severe diverticulosis, redundant colon   Bowel  obstruction  1999   no surgery in hosp x 3 days   BUBBLE STUDY  07/27/2021   Procedure: BUBBLE STUDY;  Surgeon: Orpah Cobb, MD;  Location: [facility] ENDOSCOPY;  Service: Cardiovascular;;   CARDIOVERSION N/A 11/19/2021   Procedure: CARDIOVERSION;  Surgeon: Debbe Odea, MD;  Location: [facility] ORS;  Service: Cardiovascular;  Laterality: N/A;   COLONOSCOPY  1. 1999  2. 11/04   1. Not finished  2. Slight hemorrhage rectosigmoid area, severe sig diverticulosis   COLONOSCOPY WITH PROPOFOL N/A 09/10/2019   SSP with dysplasia, TA, rpt 3 yrs Allegra Lai, Loel Dubonnet, MD)   Dexa  1. [phone]   2. 9/04  3. 3/08    1. OP  2. OP, borderline, spine -2.44T  3. decreased BMD-OP   DG KNEE 1-2 VIEWS BILAT     LS x-ray with degenerative disc and facet change   ESOPHAGOGASTRODUODENOSCOPY     Negative   FLEXIBLE SIGMOIDOSCOPY N/A 01/16/2021   Procedure: FLEXIBLE SIGMOIDOSCOPY;  Surgeon: Iva Boop, MD;  Location: [facility] ENDOSCOPY;  Service: Endoscopy;  Laterality: N/A;  or unsedated   Hemorrhoid procedure  07/2006   PICC LINE REMOVAL ([facility] HX)  08/28/2021   PROCTOSCOPY N/A 05/26/2021   Procedure: RIGID PROCTOSCOPY;  Surgeon: Karie Soda, MD;  Location: [facility] ORS;  Service: General;  Laterality: N/A;   RECTOPEXY N/A 05/26/2021   Procedure: RECTOPEXY;  Surgeon: Karie Soda, MD;  Location: [facility] ORS;  Service: General;  Laterality: N/A;   TEE WITHOUT CARDIOVERSION N/A 07/27/2021   Procedure: TRANSESOPHAGEAL ECHOCARDIOGRAM (TEE);  Surgeon: Orpah Cobb, MD;  Location: [facility] ENDOSCOPY;  Service: Cardiovascular;  Laterality: N/A;   TONSILLECTOMY     TOTAL SHOULDER ARTHROPLASTY Left 11/27/2015   Christena Flake, MD   TOTAL SHOULDER REVISION Left 03/16/2016   Procedure: TOTAL SHOULDER REVISION;  Surgeon: Christena Flake, MD;  Location: [facility] ORS;  Service: Orthopedics;  Laterality: Left;   US ECHOCARDIOGRAPHY  07/2011   Normal systolic fxn with EF 55-60%.  Focal basal septal hypertrophy.  Mild diastolic dysfunction.  Mild MR.    XI ROBOTIC ASSISTED LOWER ANTERIOR RESECTION N/A 05/26/2021   Procedure: ROBOTIC LOW ANTERIOR RECTOSIGMOID RESECTION; ASSESSMENT OF TISSUE PERFUSION WITH FIREFLY;  Surgeon: Karie Soda, MD;  Location: [facility] ORS;  Service: General;  Laterality: N/A;    Current Medications: Current Meds  Medication Sig   apixaban (ELIQUIS) 5 MG TABS tablet Take 1 tablet (5 mg total) by mouth 2 (two) times daily.   Ascorbic Acid (VITAMIN C PO) Take by mouth.   bimatoprost (LUMIGAN) 0.01 % SOLN Place 1 drop into both eyes at bedtime.   carvedilol (COREG) 3.125 MG tablet Take 1 tablet (3.125 mg total) by mouth 2 (two) times daily with a meal.   cetirizine (ZYRTEC) 10 MG tablet Take 1 tablet by mouth once daily   cholecalciferol (VITAMIN D) 1000 units tablet Take 1,000 Units by mouth daily.   clonazePAM (KLONOPIN) 0.5 MG tablet TAKE 1 TABLET BY MOUTH AT BEDTIME   colchicine 0.6 MG tablet Take 1 tablet (0.6 mg total) by mouth daily.   DULoxetine (CYMBALTA) 20 MG capsule Take 1 capsule (20 mg  total) by mouth daily.   EPINEPHrine (EPIPEN 2-PAK) 0.3 mg/0.3 mL IJ SOAJ injection Inject 0.3 mg into the muscle as needed for anaphylaxis. Geoffry Paradise INJECTION PROTOCOL   famotidine (PEPCID) 20 MG tablet Take 1 tablet (20 mg total) by mouth 2 (two) times daily.   Ferrous Sulfate (IRON) 28 MG TABS Take 1 tablet (28 mg total) by mouth every other day.   furosemide (LASIX) 20 MG tablet Take 1 tablet (20 mg total) by mouth daily.   gabapentin (NEURONTIN) 300 MG capsule TAKE 1 CAPSULE BY MOUTH ONCE DAILY AS NEEDED FOR  NERVE  PAIN  (ITCHING)   hydrALAZINE (APRESOLINE) 100 MG tablet Take 1 tablet (100 mg total) by mouth 3 (three) times daily.   levothyroxine (SYNTHROID) 100 MCG tablet Take 1 tablet (100 mcg total) by mouth daily before breakfast.   loperamide (IMODIUM) 2 MG capsule Take 1 capsule (2 mg total) by mouth 3 (three) times daily as needed for diarrhea or loose stools.   mometasone (ELOCON) 0.1 % ointment Apply topically  daily. Shower than ointment applied while still wet daily   Multiple Vitamin (MULTIVITAMIN ADULT PO) Take by mouth daily.   polyethylene glycol (MIRALAX / GLYCOLAX) 17 g packet Take 17 g by mouth daily.   Potassium 99 MG TABS Take 1 tablet (99 mg total) by mouth every Monday, 2023/04/13, and Friday.   Simethicone 180 MG CAPS Take 1 capsule by mouth daily as needed (indigestion).   spironolactone (ALDACTONE) 25 MG tablet Take 1 tablet (25 mg total) by mouth daily.   timolol (TIMOPTIC) 0.5 % ophthalmic solution Place 1 drop into both eyes every morning.   Current Facility-Administered Medications for the 01/21/23 encounter (Office Visit) with Alver Sorrow, NP  Medication   omalizumab Geoffry Paradise) prefilled syringe 150 mg     Allergies:   Ace inhibitors, Amlodipine, Carvedilol, Chlorthalidone, Montelukast, Other, Risedronate sodium, Simvastatin, Alendronate sodium, Influenza vaccines, Losartan, Paroxetine hcl, Silenor [doxepin hcl], Sulfonamide derivatives, and Toprol xl [metoprolol]   Social History   Socioeconomic History   Marital status: Widowed    Spouse name: Not on file   Number of children: 2   Years of education: Not on file   Highest education level: 12th grade  Occupational History   Occupation: Retired    Associate Professor: RETIRED  Tobacco Use   Smoking status: Never   Smokeless tobacco: Never  Vaping Use   Vaping status: Never Used  Substance and Sexual Activity   Alcohol use: No   Drug use: No   Sexual activity: Not Currently  Other Topics Concern   Not on file  Social History Narrative   Left handed   Widow. Husband ([name]) deceased Apr 12, 2016 from dementia. She was caregiver.    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   Activity: no regular exercise   Diet: good water, fruits/vegetables daily      Patient Care Team:   Eustaquio Boyden, MD as PCP - General (Family Medicine)   Debbe Odea, MD  as PCP - Cardiology (Cardiology)   Otho Ket, RN as [facility] Care Management   Gross, Viviann Spare, MD as Consulting Physician (General Surgery)   Toney Reil, MD as Consulting Physician (Gastroenterology)   Debbe Odea, MD as Consulting Physician (Cardiology)   Social Determinants of Health   Financial Resource Strain: Patient Declined (08/24/2022)   Overall Financial Resource Strain (CARDIA)    Difficulty of Paying  Living Expenses: Patient declined  Food Insecurity: No Food Insecurity (08/24/2022)   Hunger Vital Sign    Worried About Running Out of Food in the Last Year: Never true    Ran Out of Food in the Last Year: Never true  Transportation Needs: Unknown (08/24/2022)   PRAPARE - Administrator, Civil Service (Medical): No    Lack of Transportation (Non-Medical): Not on file  Physical Activity: Unknown (08/24/2022)   Exercise Vital Sign    Days of Exercise per Week: 0 days    Minutes of Exercise per Session: Not on file  Stress: Patient Declined (08/24/2022)   [employer] of Occupational Health - Occupational Stress Questionnaire    Feeling of Stress : Patient declined  Social Connections: Moderately Isolated (08/24/2022)   Social Connection and Isolation Panel [NHANES]    Frequency of Communication with Friends and Family: More than three times a week    Frequency of Social Gatherings with Friends and Family: Once a week    Attends Religious Services: 1 to 4 times per year    Active Member of [employer] or Organizations: No    Attends Banker Meetings: Not on file    Marital Status: Widowed     Family History: The patient's family history includes Breast cancer in her cousin; Coronary artery disease in her brother; Diabetes in her brother and paternal grandfather.  ROS:   Please see the history of present illness.     All other systems reviewed and are negative.  EKGs/Labs/Other Studies Reviewed:         Recent  Labs: 06/16/2022: Magnesium 1.9 08/06/2022: ALT 25 09/13/2022: Hemoglobin 14.6; Platelets 226.0 10/06/2022: TSH 4.81 10/19/2022: BUN 19; Creatinine, Ser 1.26; Potassium 4.4; Sodium 139   Recent Lipid Panel    Component Value Date/Time   CHOL 181 08/06/2022 0841   CHOL 179 03/27/2021 0935   TRIG 79.0 08/06/2022 0841   HDL 64.70 08/06/2022 0841   HDL 71 03/27/2021 0935   CHOLHDL 3 08/06/2022 0841   VLDL 15.8 08/06/2022 0841   LDLCALC 101 (H) 08/06/2022 0841   LDLCALC 95 03/27/2021 0935   LDLDIRECT 86.0 09/19/2014 1049    Physical Exam:   VS:  BP (!) 158/83   Pulse 77   Ht 5' (1.524 m)   Wt 172 lb (78 kg)   SpO2 95%   BMI 33.59 kg/m  , BMI Body mass index is 33.59 kg/m. GENERAL:  Well appearing HEENT: Pupils equal round and reactive, fundi not visualized, oral mucosa unremarkable NECK:  No jugular venous distention, waveform within normal limits, carotid upstroke brisk and symmetric, no bruits, no thyromegaly LYMPHATICS:  No cervical adenopathy LUNGS:  Clear to auscultation bilaterally HEART:  RRR.  PMI not displaced or sustained,S1 and S2 within normal limits, no S3, no S4, no clicks, no rubs, no murmurs ABD:  Flat, positive bowel sounds normal in frequency in pitch, no bruits, no rebound, no guarding, no midline pulsatile mass, no hepatomegaly, no splenomegaly EXT:  2 plus pulses throughout, no edema, no cyanosis no clubbing SKIN:  No rashes no nodules NEURO:  Cranial nerves II through XII grossly intact, motor grossly intact throughout PSYCH:  Cognitively intact, oriented to person place and time   ASSESSMENT/PLAN:    HTN - BP not at goal <130/80. Multiple intolerances, as above. Home BP average over the last month 137/79 via home cuff. Did not tolerate Amlodipine due to itching. Reports persistent itching. Discussed most likely culprit is Hydralazine,  reduce to 50mg  TID. Will adjust Hydralazine timing to closer to q8h. Continue Coreg 3.125mg  BID (bradycardia with higher  doses), Lasix 20mg  daily, Spironolactone 25mg  daily. Add Doxazosin 2mg  at bedtime. If itching improves with reduced dose Hydralazine, consider further reducing with escalation of Doxazosin dosing as tolerated.  RAS - CT angio abd 10/2022 patent single right renal artery with mild osital stenosis. There was chronic occlusion of main left renal artery with accessory renal artery which was patent. No adrenal adenomas. No indication for repeat imaging at this time.   Persistent atrial fibrillation / Hypercoagulable state - Continue Coreg 3.125mg  BID, Eliquis 5mg  BID. Does not meet dose reduction criteria.   HFpEF / Mild-mod AS / Mod MR - Euvolemic and well compensated on exam. Echo 11/2022 normal LVEF, mild-mod AS, mod MR. GDMT Spironolactone, Lasix, Coreg. Low sodium diet, fluid restriction <2L, and daily weights encouraged. Educated to contact our office for weight gain of 2 lbs overnight or 5 lbs in one week.   Screening for Secondary Hypertension:     10/18/2022    3:11 PM  Causes  Drugs/Herbals Screened     - Comments limits added salt.  No caffeine or alcohol.  Renovascular HTN Screened     - Comments abdominal CT-A. Renal Doppler inconclusive 08/2021  Sleep Apnea Screened     - Comments uses CPAP  Thyroid Disease Screened  Hyperaldosteronism Screened     - Comments check renin and aldosterone  Pheochromocytoma Screened     - Comments check catecholamines and metanephrines  Cushing's Syndrome N/A  Hyperparathyroidism Screened  Coarctation of the Aorta Screened  Compliance Screened    Relevant Labs/Studies:    Latest Ref Rng & Units 10/19/2022    8:45 AM 10/06/2022   12:47 PM 09/15/2022    2:41 PM  Basic Labs  Sodium 134 - 144 mmol/L 139  137  135   Potassium 3.5 - 5.2 mmol/L 4.4  4.2  4.4   Creatinine 0.57 - 1.00 mg/dL 9.56  2.13  0.86        Latest Ref Rng & Units 10/06/2022   12:47 PM 06/16/2022   10:03 AM  Thyroid   TSH 0.35 - 5.50 uIU/mL 4.81  1.75        Latest Ref Rng &  Units 10/19/2022    8:45 AM  Renin/Aldosterone   Aldosterone 0.0 - 30.0 ng/dL 57.8   Aldos/Renin Ratio 0.0 - 30.0 8.9        Latest Ref Rng & Units 10/19/2022    8:45 AM  Metanephrines/Catecholamines   Epinephrine 0 - 62 pg/mL 33   Norepinephrine 0 - 874 pg/mL 1,538   Dopamine 0 - 48 pg/mL <30   Metanephrines 0.0 - 88.0 pg/mL <25.0   Normetanephrines  0.0 - 297.2 pg/mL 132.4        Latest Ref Rng & Units 09/07/2021    4:31 AM  Cortisol  Cortisol  ug/dL 46.9        09/26/9526    9:03 AM  Renovascular   Renal Artery [place] Completed Yes        Disposition:    FU with MD/PharmD in 2-3 months    Medication Adjustments/Labs and Tests Ordered: Current medicines are reviewed at length with the patient today.  Concerns regarding medicines are outlined above.  No orders of the defined types were placed in this encounter.  No orders of the defined types were placed in this encounter.    Signed, Alver Sorrow, NP  01/21/2023 3:46 PM    [facility]

## 2023-01-24 NOTE — Telephone Encounter (Signed)
ERx 

## 2023-01-24 NOTE — Telephone Encounter (Signed)
Addressed via clinic visit 01/21/23. Please refer to clinic note.  Alver Sorrow, NP

## 2023-01-26 ENCOUNTER — Ambulatory Visit: Payer: PPO

## 2023-01-27 DIAGNOSIS — H52223 Regular astigmatism, bilateral: Secondary | ICD-10-CM | POA: Diagnosis not present

## 2023-01-27 DIAGNOSIS — H353131 Nonexudative age-related macular degeneration, bilateral, early dry stage: Secondary | ICD-10-CM | POA: Diagnosis not present

## 2023-01-27 DIAGNOSIS — Z9842 Cataract extraction status, left eye: Secondary | ICD-10-CM | POA: Diagnosis not present

## 2023-01-27 DIAGNOSIS — D3132 Benign neoplasm of left choroid: Secondary | ICD-10-CM | POA: Diagnosis not present

## 2023-01-27 DIAGNOSIS — Z9841 Cataract extraction status, right eye: Secondary | ICD-10-CM | POA: Diagnosis not present

## 2023-01-27 DIAGNOSIS — H401134 Primary open-angle glaucoma, bilateral, indeterminate stage: Secondary | ICD-10-CM | POA: Diagnosis not present

## 2023-01-28 IMAGING — CT CT HEAD W/O CM
4 series · 17 of 47 positions shown, 19 images · non-contrast
Comparison: January 14, 2021

CLINICAL DATA: Nausea and vomiting.



[Series 2: head wo · axial · 0.44mm/px · z∈[-622,-502]mm · 7 of 32 slices shown, 9 images]
[im 4/32  brain]
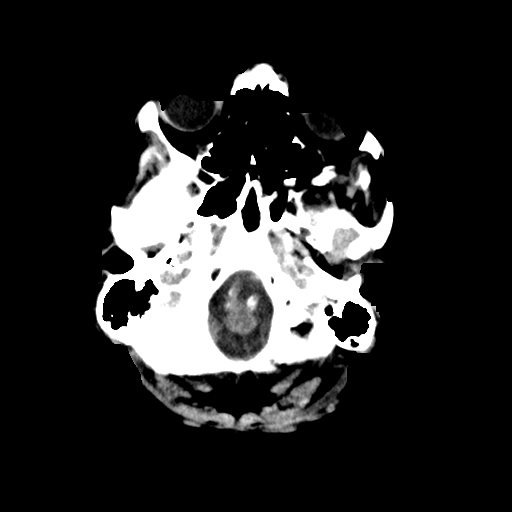
[im 4/32  bone]
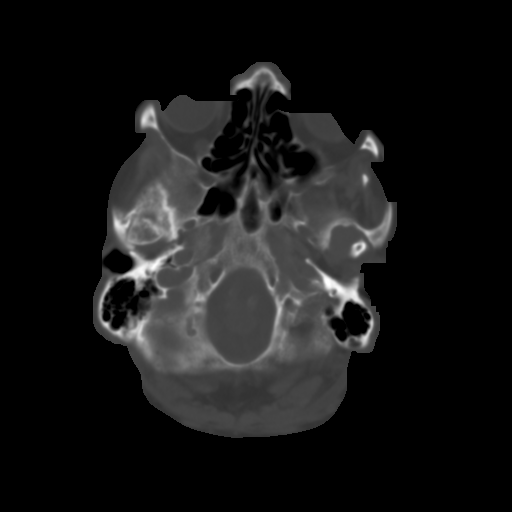
[im 8/32  brain]
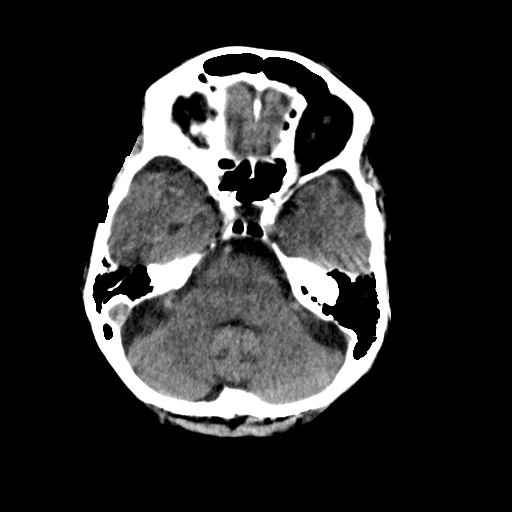
[im 12/32  brain]
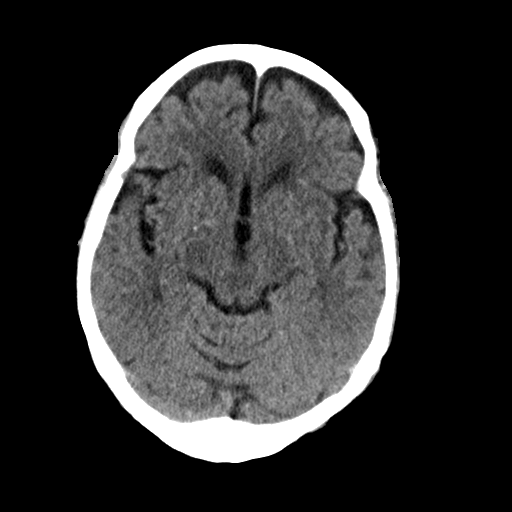
[im 16/32  brain]
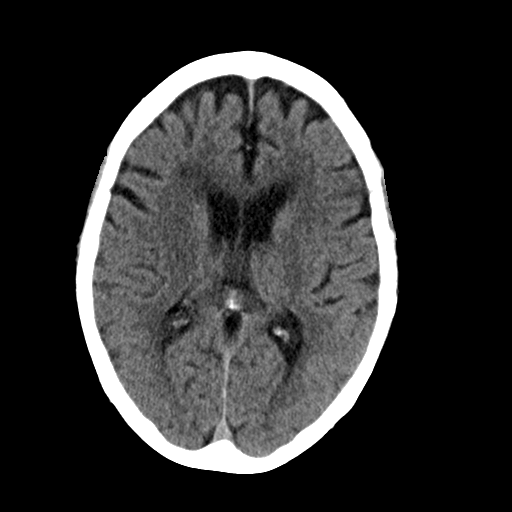
[im 20/32  brain]
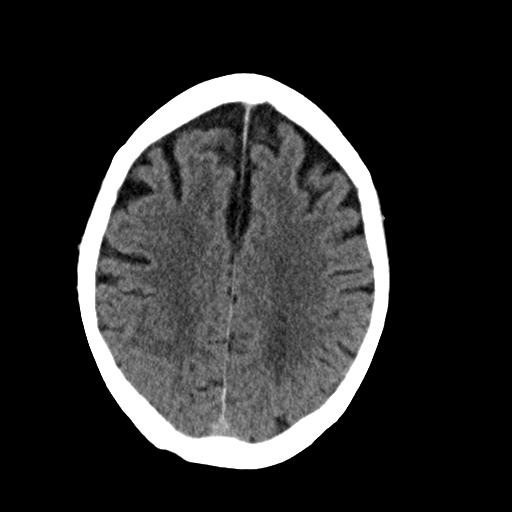
[im 20/32  bone]
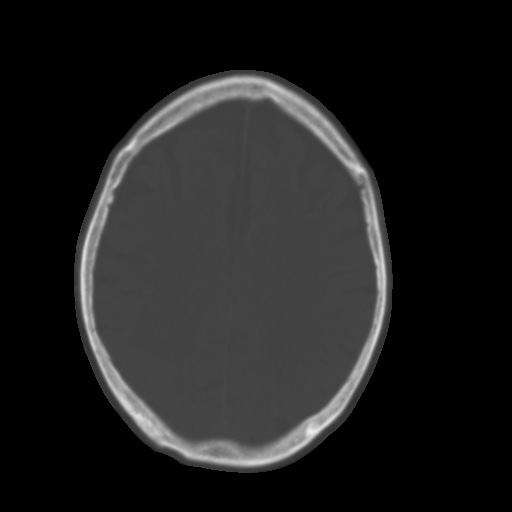
[im 24/32  brain]
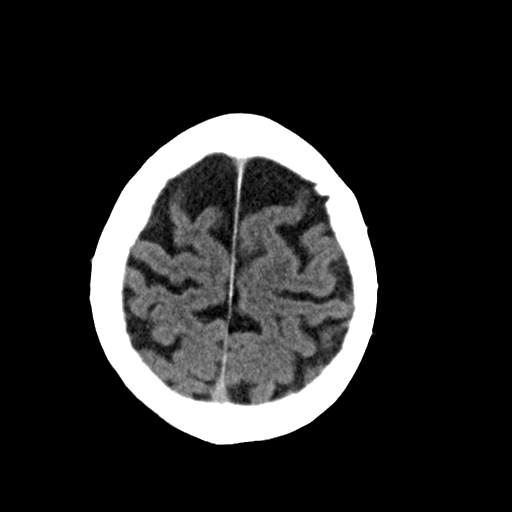
[im 28/32  brain]
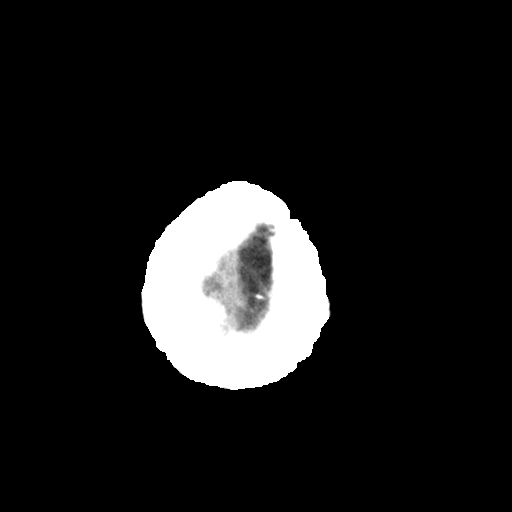

[Series 3: head bone · axial · 0.44mm/px · z∈[-623,-567]mm · 4 of 80 slices shown]
[im 8/80  bone]
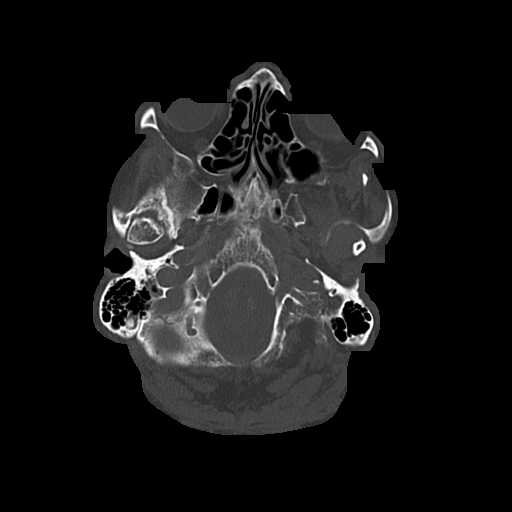
[im 16/80  bone]
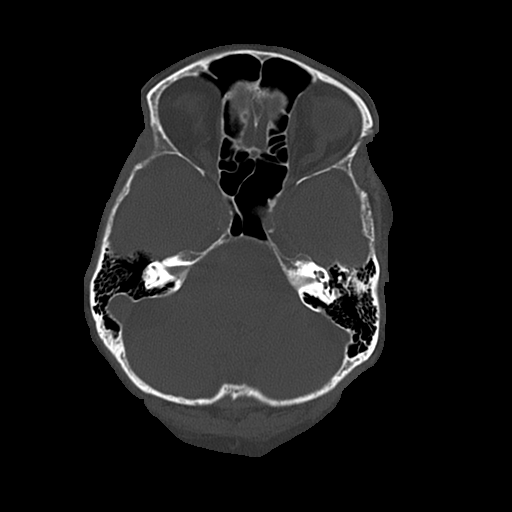
[im 24/80  bone]
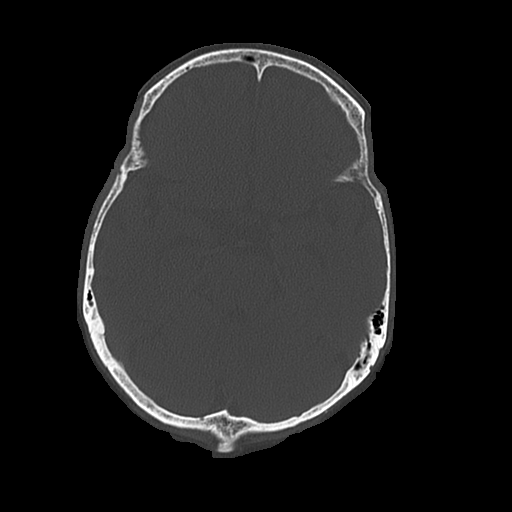
[im 36/80  bone]
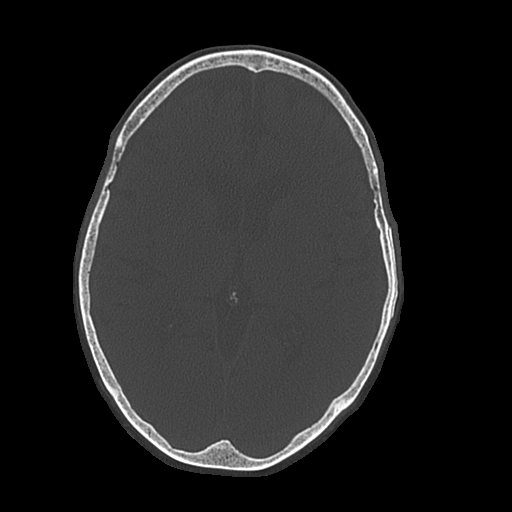

[Series 4: coronal soft · coronal · 0.32mm/px · 3 of 68 slices shown]
[im 23/68  brain]
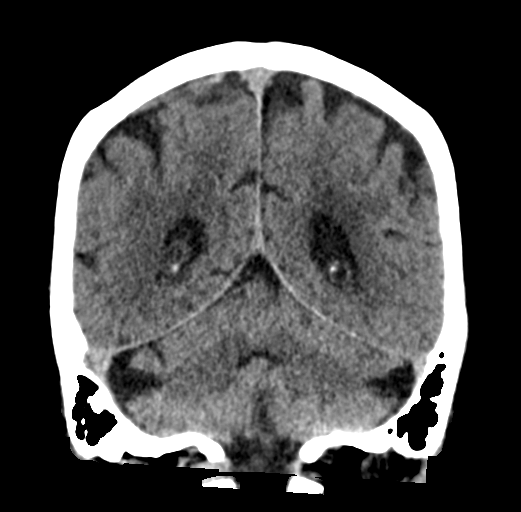
[im 30/68  brain]
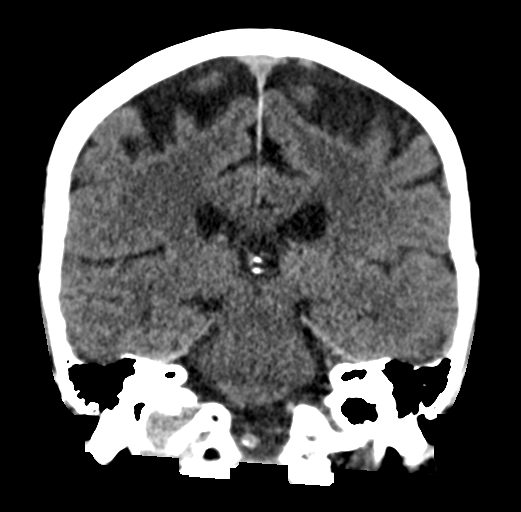
[im 38/68  brain]
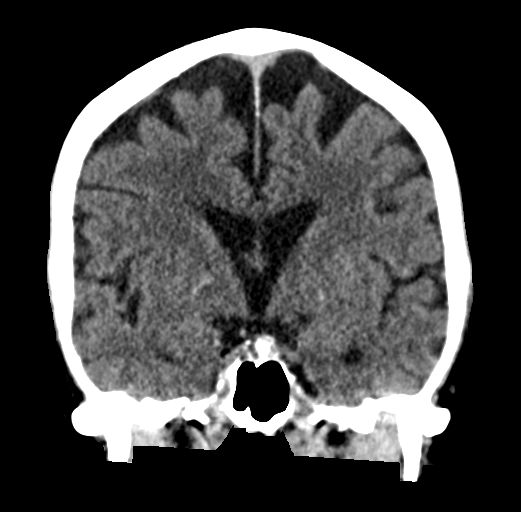

[Series 5: sagittal soft · sagittal · 0.32mm/px · 3 of 57 slices shown]
[im 19/57  brain]
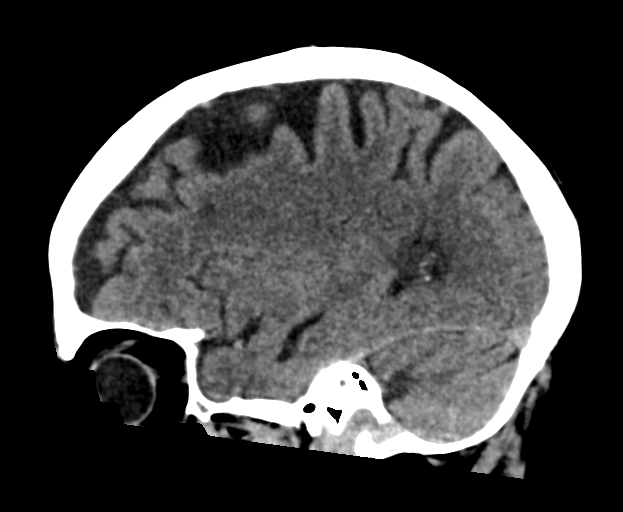
[im 29/57  brain]
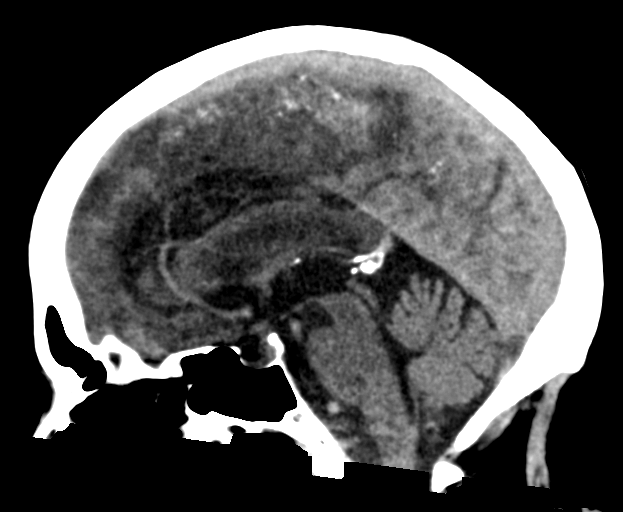
[im 38/57  brain]
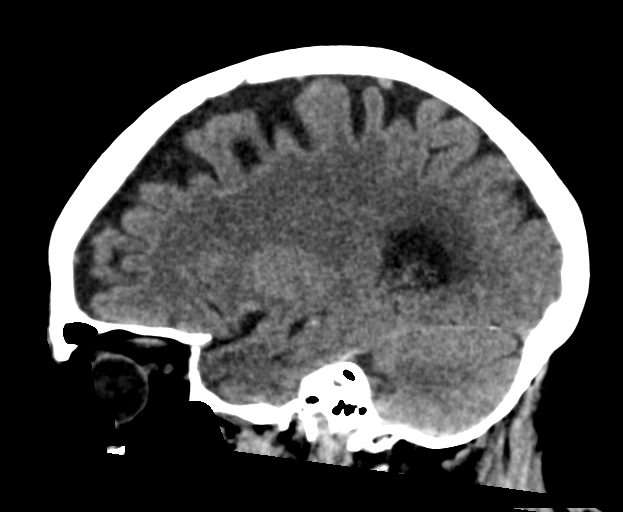

[17 of 47 positions shown; findings below may reference images not displayed]

FINDINGS: Brain: There is mild cerebral atrophy with widening of the
extra-axial spaces and ventricular dilatation.
There are areas of decreased attenuation within the white matter
tracts of the supratentorial brain, consistent with microvascular
disease changes.

A small chronic left cerebellar infarct is seen.

Vascular: No hyperdense vessel or unexpected calcification.

Skull: Normal. Negative for fracture or focal lesion.

Sinuses/Orbits: No acute finding.

Other: None.
IMPRESSION: 1. No acute intracranial abnormality.
2. Generalized cerebral atrophy with chronic white matter small
vessel ischemia.

## 2023-01-28 IMAGING — DX DG CHEST 1V PORT
1 series · 1 of 1 positions shown · non-contrast
Comparison: 08/04/2021 and older exams.

CLINICAL DATA: Shortness of breath.

EXAM:
PORTABLE CHEST 1 VIEW

[chest ap]
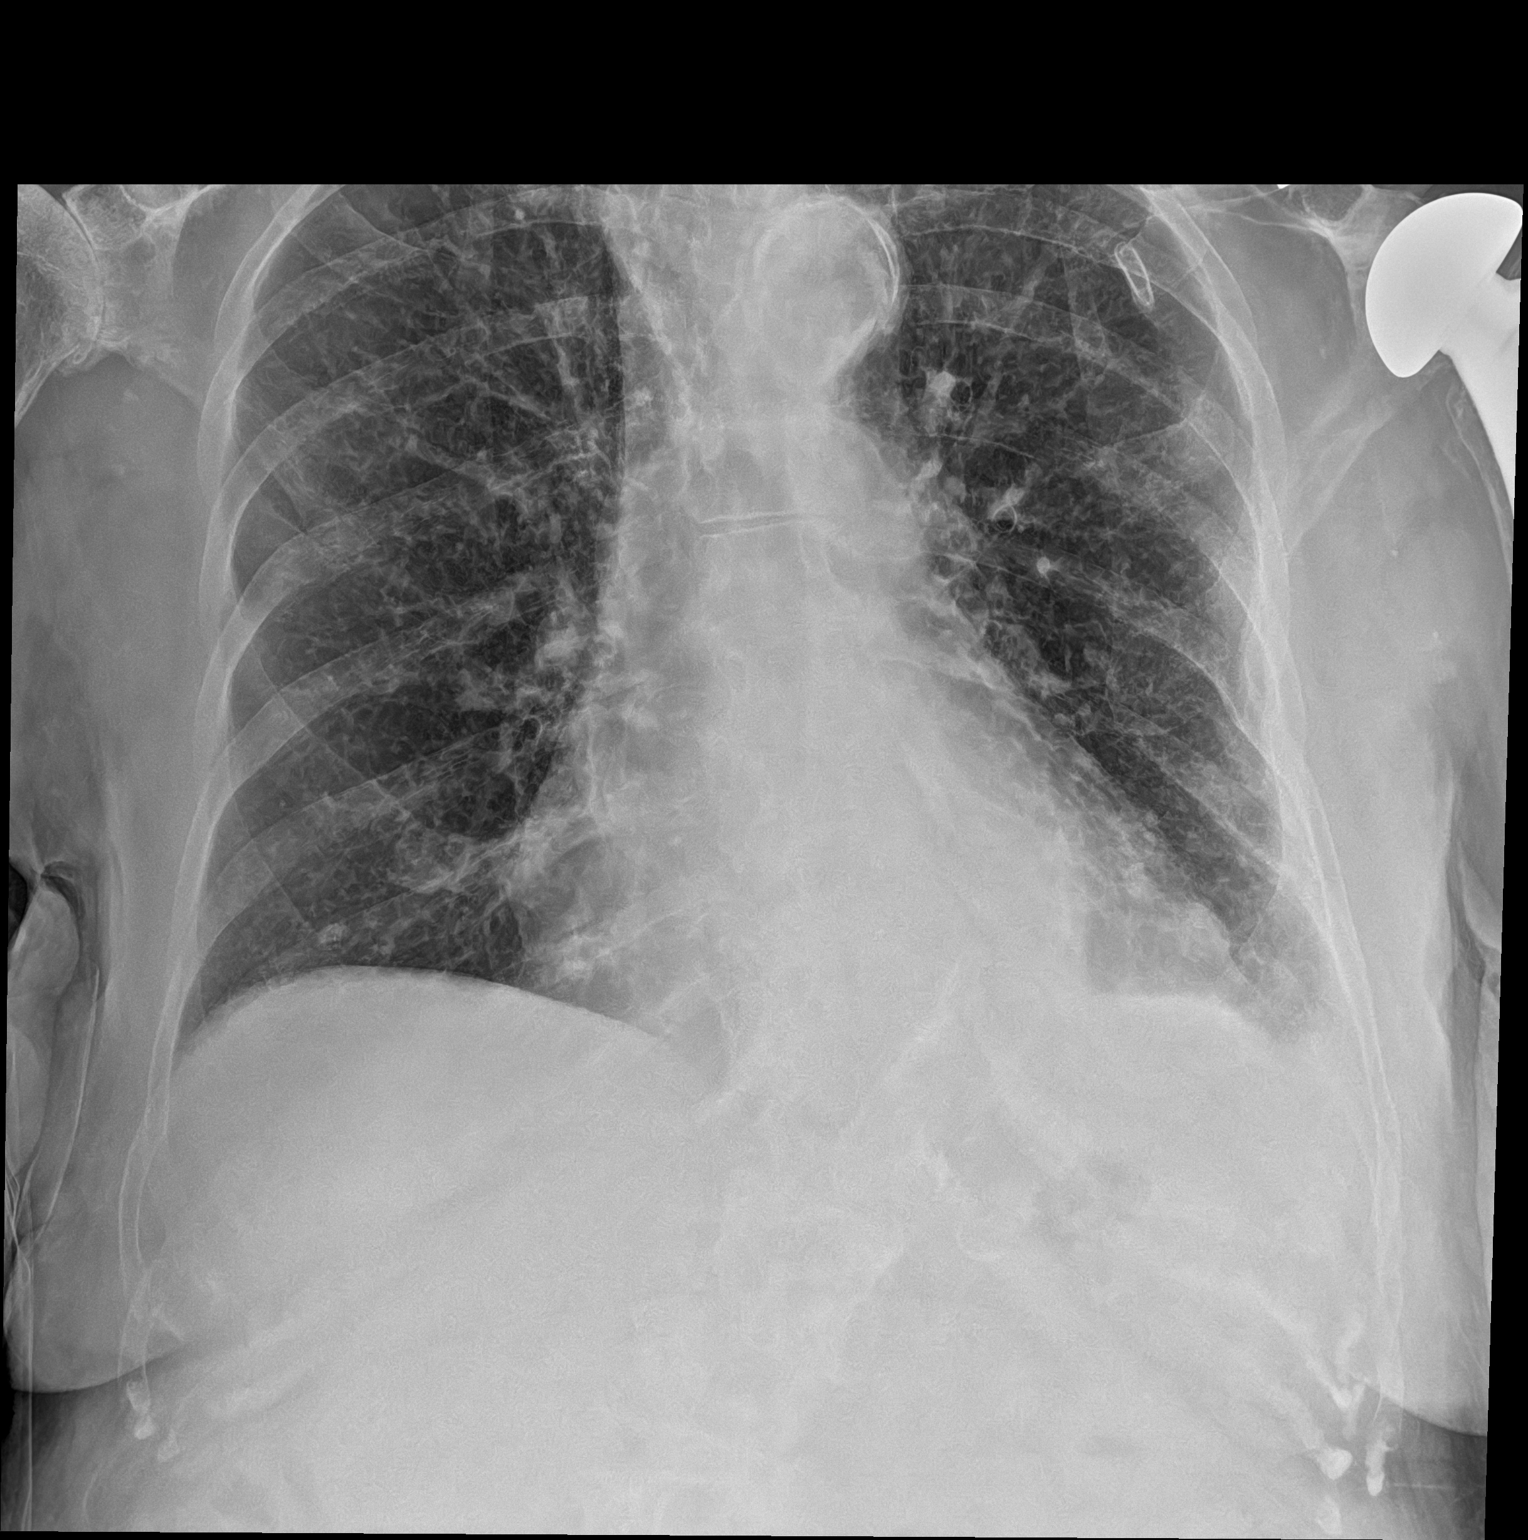

[1 of 1 positions shown; findings below may reference images not displayed]

FINDINGS: Mild stable enlargement of the cardiac silhouette. No mediastinal or
hilar masses.

Chronic interstitial thickening. Mild opacity at the left lung base
consistent with a small effusion and atelectasis, decreased compared
to the prior exam. No right pleural effusion. No pneumothorax.

Skeletal structures are diffusely demineralized. Partly imaged left
shoulder prosthesis, stable.
IMPRESSION: 1. No acute cardiopulmonary disease.
2. Small left pleural effusion with associated atelectasis,
decreased compared to the most recent prior study.
3. Chronic interstitial thickening and mild stable cardiomegaly.

## 2023-01-31 ENCOUNTER — Encounter (HOSPITAL_BASED_OUTPATIENT_CLINIC_OR_DEPARTMENT_OTHER): Payer: Self-pay

## 2023-01-31 NOTE — Telephone Encounter (Signed)
Update as requested

## 2023-02-01 NOTE — Telephone Encounter (Signed)
FYI

## 2023-02-10 NOTE — Telephone Encounter (Signed)
Recommend reduce Hydralazine to 25mg  twice daily (12 hours apart) Increase Doxazosin to 4mg  at bedtime.   Alver Sorrow, NP

## 2023-02-10 NOTE — Telephone Encounter (Signed)
BP update

## 2023-02-11 MED ORDER — DOXAZOSIN MESYLATE 4 MG PO TABS: 4.0000 mg | ORAL_TABLET | Freq: Every day | ORAL | Status: DC

## 2023-02-11 MED ORDER — HYDRALAZINE HCL 25 MG PO TABS: 25.0000 mg | ORAL_TABLET | Freq: Two times a day (BID) | ORAL | 3 refills | Status: DC

## 2023-02-11 NOTE — Addendum Note (Signed)
Addended by: Marlene Lard on: 02/11/2023 07:36 AM   Modules accepted: Orders

## 2023-02-14 ENCOUNTER — Encounter: Payer: Self-pay | Admitting: Family Medicine

## 2023-02-15 ENCOUNTER — Other Ambulatory Visit: Payer: Self-pay

## 2023-02-15 ENCOUNTER — Telehealth: Payer: Self-pay | Admitting: Adult Health

## 2023-02-15 ENCOUNTER — Encounter: Payer: Self-pay | Admitting: Allergy and Immunology

## 2023-02-15 ENCOUNTER — Ambulatory Visit: Payer: PPO | Admitting: Allergy and Immunology

## 2023-02-15 VITALS — BP 140/90 | HR 79 | Temp 79.0°F | Resp 16 | Ht 59.45 in | Wt 170.9 lb

## 2023-02-15 DIAGNOSIS — J310 Chronic rhinitis: Secondary | ICD-10-CM | POA: Diagnosis not present

## 2023-02-15 DIAGNOSIS — L501 Idiopathic urticaria: Secondary | ICD-10-CM | POA: Diagnosis not present

## 2023-02-15 DIAGNOSIS — L308 Other specified dermatitis: Secondary | ICD-10-CM | POA: Diagnosis not present

## 2023-02-15 DIAGNOSIS — L989 Disorder of the skin and subcutaneous tissue, unspecified: Secondary | ICD-10-CM

## 2023-02-15 DIAGNOSIS — L299 Pruritus, unspecified: Secondary | ICD-10-CM

## 2023-02-15 NOTE — Telephone Encounter (Signed)
Overnight oximetry test done on January 04, 2023 on BiPAP on room air . showed adequate oxygenation liters with only 32 seconds of the entire test spent below 88% average O2 saturation was 95%. Continue on BiPAP at bedtime May discontinue oxygen at bedtime with BiPAP

## 2023-02-15 NOTE — Progress Notes (Unsigned)
[facility] [place] [facility] [place] [facility] [place] [facility] [name]   Follow-up Note  Referring Provider: Eustaquio Boyden, MD Primary Provider: Eustaquio Boyden, MD Date of Office Visit: 02/15/2023  Subjective:   Brooke Beasley (DOB: [birth year]) is a 89 y.o. female who [facility] in re-evaluation of the following:  HPI: Chrisma returns to this clinic in evaluation of her urticaria/inflammatory dermatosis/pruritic disorder.  I last saw her in this clinic 17 August 2022.   She has had a prolonged experience with the use of omalizumab to see if this helps with her itchiness and it has not really made a big difference.  She continues to use an H1 and H2 receptor blocker once or twice a week she puts on a topical steroid to areas of her skin that are inflamed.  She is working with her cardiologist to change her antihypertensive medications assuming that this is the trigger for her pruritic disorder.  She has had some problems with intermittent runny nose.  She does not really have any other associated respiratory tract symptoms.  Allergies as of 02/15/2023       Reactions   Ace Inhibitors Cough   Amlodipine Nausea Only, Other (See Comments)   Stomach pains--"feel like my insides are on fire"   Carvedilol Other (See Comments)   fatigue   Chlorthalidone Other (See Comments)   Severe hyponatremia   Montelukast Other (See Comments)   Slurred speech; really drowsy   Other Other (See Comments)   No seeds or corn because of IBS   Risedronate Sodium Other (See Comments)   REACTION: joint pain   Simvastatin Other (See Comments)   REACTION: the full 20 mg pill causes leg pain- can tol 10 mg   Alendronate Sodium Palpitations   Influenza Vaccines Rash, Other (See Comments)   Led to mental breakdown in 1960s   Losartan Other (See Comments)   Cr bumped   Paroxetine Hcl Other (See Comments)   Not effective - felt ill on this medicine   Silenor  [doxepin Hcl] Palpitations   Hallucinations and racing heart   Sulfonamide Derivatives Rash   Toprol Xl [metoprolol] Diarrhea   Diarrhea and weakness        Medication List    apixaban 5 MG Tabs tablet Commonly known as: Eliquis Take 1 tablet (5 mg total) by mouth 2 (two) times daily.   bimatoprost 0.01 % Soln Commonly known as: LUMIGAN Place 1 drop into both eyes at bedtime.   carvedilol 3.125 MG tablet Commonly known as: COREG Take 1 tablet (3.125 mg total) by mouth 2 (two) times daily with a meal.   cetirizine 10 MG tablet Commonly known as: ZYRTEC Take 1 tablet by mouth once daily   cholecalciferol 1000 units tablet Commonly known as: VITAMIN D Take 1,000 Units by mouth daily.   clonazePAM 0.5 MG tablet Commonly known as: KLONOPIN TAKE 1 TABLET BY MOUTH AT BEDTIME   colchicine 0.6 MG tablet Take 1 tablet (0.6 mg total) by mouth daily.   doxazosin 4 MG tablet Commonly known as: Cardura Take 1 tablet (4 mg total) by mouth daily.   DULoxetine 20 MG capsule Commonly known as: Cymbalta Take 1 capsule (20 mg total) by mouth daily.   EPINEPHrine 0.3 mg/0.3 mL Soaj injection Commonly known as: EpiPen 2-Pak Inject 0.3 mg into the muscle as needed for anaphylaxis. [facility] [place] INJECTION PROTOCOL   famotidine 20 MG tablet Commonly known as: PEPCID Take 1 tablet (20  mg total) by mouth 2 (two) times daily.   furosemide 20 MG tablet Commonly known as: LASIX Take 1 tablet (20 mg total) by mouth daily.   gabapentin 300 MG capsule Commonly known as: NEURONTIN TAKE 1 CAPSULE BY MOUTH ONCE DAILY AS NEEDED FOR  NERVE  PAIN  (ITCHING)   hydrALAZINE 25 MG tablet Commonly known as: APRESOLINE Take 1 tablet (25 mg total) by mouth in the morning and at bedtime.   Iron 28 MG Tabs Take 1 tablet (28 mg total) by mouth every other day.   levothyroxine 100 MCG tablet Commonly known as: Synthroid Take 1 tablet (100 mcg total) by mouth daily before breakfast.   loperamide 2 MG  capsule Commonly known as: IMODIUM Take 1 capsule (2 mg total) by mouth 3 (three) times daily as needed for diarrhea or loose stools.   mometasone 0.1 % ointment Commonly known as: ELOCON Apply topically daily. Shower than ointment applied while still wet daily   MULTIVITAMIN ADULT PO Take by mouth daily.   polyethylene glycol 17 g packet Commonly known as: MIRALAX / GLYCOLAX Take 17 g by mouth daily.   Potassium 99 MG Tabs Take 1 tablet (99 mg total) by mouth every Monday, Wednesday, and Friday.   Simethicone 180 MG Caps Take 1 capsule by mouth daily as needed (indigestion).   spironolactone 25 MG tablet Commonly known as: ALDACTONE Take 1 tablet (25 mg total) by mouth daily.   timolol 0.5 % ophthalmic solution Commonly known as: TIMOPTIC Place 1 drop into both eyes every morning.   VITAMIN C PO Take by mouth.    Past Medical History:  Diagnosis Date   Anxiety    C. difficile diarrhea 07/11/2018   S/p multiple oral vanc treatments (course and tapers) and completed Zinplava monoclonal Ab treatment (05/10/2019) Fecal transplant on hold during COVID pandemic   CAP (community acquired pneumonia) 12/22/2017   Carotid stenosis 10/18/2014   R 1-39%, L 40-59%, rpt 1 yr (09/2014)    CKD (chronic kidney disease) stage 3, GFR 30-59 ml/min (HCC) 08/02/2014   Closed fracture of right orbit (HCC) 02/10/2021   Clostridioides difficile infection    hx 2020   COVID-19 virus infection 10/13/2020   Degenerative disc disease    LS   Depression    nervous breakdonw in 1964-out of work for a year   Diverticulosis    severe by colonoscopy   Family history of adverse reaction to anesthesia    n/v   GERD (gastroesophageal reflux disease)    Glaucoma    Heart murmur 08/2011   mitral regurge - on echo    History of hiatal hernia    History of shingles    HTN (hypertension)    Hyperlipidemia    Hypertension    Hypothyroidism    IBS 11/02/2006   Influenza A 04/20/2022   Intestinal  bacterial overgrowth    In small colon   Left shoulder pain 11/04/2014   Lower GI bleed 12/2020   thought diverticular complicated by ABLA with syncope and orbital fracture s/p hospitalization   Maxillary fracture (HCC) 04/08/2012   Osteoarthritis 2016   [facility])   Osteoporosis    dexa 2011   Perirectal abscess 08/03/2021   CT guided aspiration of abscess grow Proteums mirabilis and Citrobacter koseri 07/2021, currently receiving IV Zosyn planned total 6 wks.   Pseudogout 2016   shoulders ([place])   Strep pharyngitis 04/20/2022    Past Surgical History:  Procedure Laterality Date   barium enema  2012   severe diverticulosis, redundant colon   Bowel obstruction  1999   no surgery in hosp x 3 days   BUBBLE STUDY  07/27/2021   Procedure: BUBBLE STUDY;  Surgeon: Orpah Cobb, MD;  Location: [facility] ENDOSCOPY;  Service: Cardiovascular;;   CARDIOVERSION N/A 11/19/2021   Procedure: CARDIOVERSION;  Surgeon: Debbe Odea, MD;  Location: [facility] ORS;  Service: Cardiovascular;  Laterality: N/A;   COLONOSCOPY  1. 1999  2. 11/04   1. Not finished  2. Slight hemorrhage rectosigmoid area, severe sig diverticulosis   COLONOSCOPY WITH PROPOFOL N/A 09/10/2019   SSP with dysplasia, TA, rpt 3 yrs Allegra Lai, Loel Dubonnet, MD)   Dexa  1. [id number]   2. 9/04  3. 3/08    1. OP  2. OP, borderline, spine -2.44T  3. decreased BMD-OP   DG KNEE 1-2 VIEWS BILAT     LS x-ray with degenerative disc and facet change   ESOPHAGOGASTRODUODENOSCOPY     Negative   FLEXIBLE SIGMOIDOSCOPY N/A 01/16/2021   Procedure: FLEXIBLE SIGMOIDOSCOPY;  Surgeon: Iva Boop, MD;  Location: [facility] ENDOSCOPY;  Service: Endoscopy;  Laterality: N/A;  or unsedated   Hemorrhoid procedure  07/2006   PICC LINE REMOVAL ([facility] HX)  08/28/2021   PROCTOSCOPY N/A 05/26/2021   Procedure: RIGID PROCTOSCOPY;  Surgeon: Karie Soda, MD;  Location: [facility] ORS;  Service: General;  Laterality: N/A;   RECTOPEXY N/A 05/26/2021   Procedure: RECTOPEXY;   Surgeon: Karie Soda, MD;  Location: [facility] ORS;  Service: General;  Laterality: N/A;   TEE WITHOUT CARDIOVERSION N/A 07/27/2021   Procedure: TRANSESOPHAGEAL ECHOCARDIOGRAM (TEE);  Surgeon: Orpah Cobb, MD;  Location: [facility] ENDOSCOPY;  Service: Cardiovascular;  Laterality: N/A;   TONSILLECTOMY     TOTAL SHOULDER ARTHROPLASTY Left 11/27/2015   Christena Flake, MD   TOTAL SHOULDER REVISION Left 03/16/2016   Procedure: TOTAL SHOULDER REVISION;  Surgeon: Christena Flake, MD;  Location: [facility] ORS;  Service: Orthopedics;  Laterality: Left;   US ECHOCARDIOGRAPHY  07/2011   Normal systolic fxn with EF 55-60%.  Focal basal septal hypertrophy.  Mild diastolic dysfunction.  Mild MR.   XI ROBOTIC ASSISTED LOWER ANTERIOR RESECTION N/A 05/26/2021   Procedure: ROBOTIC LOW ANTERIOR RECTOSIGMOID RESECTION; ASSESSMENT OF TISSUE PERFUSION WITH FIREFLY;  Surgeon: Karie Soda, MD;  Location: [facility] ORS;  Service: General;  Laterality: N/A;    Review of systems negative except as noted in HPI / PMHx or noted below:  Review of Systems  Constitutional: Negative.   HENT: Negative.    Eyes: Negative.   Respiratory: Negative.    Cardiovascular: Negative.   Gastrointestinal: Negative.   Genitourinary: Negative.   Musculoskeletal: Negative.   Skin: Negative.   Neurological: Negative.   Endo/Heme/Allergies: Negative.   Psychiatric/Behavioral: Negative.       Objective:   Vitals:   02/15/23 1615  BP: (!) 140/90  Pulse: 79  Resp: 16  Temp: (!) 79 F (26.1 C)  SpO2: 95%   Height: 4' 11.45" (151 cm)  Weight: 170 lb 14.4 oz (77.5 kg)   Physical Exam Skin:    Findings: Lesion (2 x 1 cm nodular lesion with slight scale left upper arm) present.     Diagnostics: none  Assessment and Plan:   1. Idiopathic urticaria   2. Inflammatory dermatosis   3. Pruritic disorder   4. Chronic rhinitis   5. Nodular dermatitis    1.  Discontinue omalizumab injections  2.  Use a combination of the following every  day:  A.  Cetirizine 10 mg - 1 tablet 1-2 times a day  B.  Famotidine 20 mg - 1 tablet 1-2 times a day  C. Shower, then mometasone 0.1% ointment applied while wet 1-7 times a week  3. Nasal spray to dry nose???  4. Visit with dermatology about left upper arm lesion  5. Continue to work with Cardiology about high blood pressure medication changes  6. Further evaluation and treatment???  We are going to discontinue Infiniti's omalizumab injections as they did not really make a big difference regarding control of her pruritic inflammatory urticarial skin condition.  We have always suspected that this is secondary to a drug effect and she is working through her cardiologist to change out her antihypertensive medications as this may be the culprit responsible for her immune activation.  She has a little bit of problem with runny nose and if that becomes more of an issue for her she can contact me and we will give her nasal ipratropium.  She has a nodular lesion on her left upper arm that needs to be looked at by a dermatologist and she will make that appointment sometime in the next few weeks.  Laurette Schimke, MD Allergy / Immunology [facility]

## 2023-02-15 NOTE — Patient Instructions (Addendum)
  1.  Discontinue omalizumab injections  2.  Use a combination of the following every day:   A.  Cetirizine 10 mg - 1 tablet 1-2 times a day  B.  Famotidine 20 mg - 1 tablet 1-2 times a day  C. Shower, then mometasone 0.1% ointment applied while wet 1-7 times a week  3. Nasal spray to dry nose???  4. Visit with dermatology about left upper arm lesion  5. Continue to work with Cardiology about high blood pressure medication changes  6. Further evaluation and treatment???

## 2023-02-16 ENCOUNTER — Encounter: Payer: Self-pay | Admitting: Allergy and Immunology

## 2023-02-16 MED ORDER — MOMETASONE FUROATE 0.1 % EX OINT: TOPICAL_OINTMENT | Freq: Every day | CUTANEOUS | 1 refills | Status: DC

## 2023-02-16 MED ORDER — FAMOTIDINE 20 MG PO TABS: 20.0000 mg | ORAL_TABLET | Freq: Two times a day (BID) | ORAL | 1 refills | Status: DC

## 2023-02-16 MED ORDER — CETIRIZINE HCL 10 MG PO TABS: 10.0000 mg | ORAL_TABLET | Freq: Two times a day (BID) | ORAL | 1 refills | Status: DC

## 2023-02-17 NOTE — Telephone Encounter (Signed)
Patient is aware of results and voiced her understanding and voiced her understanding.  Order placed to d/c nocturnal oxygen 12/2022. Confirmed with patient that oxygen has been picked up. Nothing further needed.

## 2023-02-22 NOTE — Telephone Encounter (Signed)
Please review BP log after previous changes made.

## 2023-02-23 DIAGNOSIS — M19011 Primary osteoarthritis, right shoulder: Secondary | ICD-10-CM | POA: Diagnosis not present

## 2023-02-23 DIAGNOSIS — M11211 Other chondrocalcinosis, right shoulder: Secondary | ICD-10-CM | POA: Diagnosis not present

## 2023-02-25 DIAGNOSIS — C44619 Basal cell carcinoma of skin of left upper limb, including shoulder: Secondary | ICD-10-CM

## 2023-02-25 HISTORY — DX: Basal cell carcinoma of skin of left upper limb, including shoulder: C44.619

## 2023-02-25 MED ORDER — DOXAZOSIN MESYLATE 1 MG PO TABS: 3.0000 mg | ORAL_TABLET | Freq: Every day | ORAL | 3 refills | Status: DC

## 2023-02-25 NOTE — Telephone Encounter (Signed)
Call to patient...   ? When getting low BP in the morning is it pre/post medications? AFTER MEDICATIONS ? Ensure Hydralazine 12 hours apart and Doxazosin in the evening. YES  ? How does she feel with low readings? If feels fine, less concerning. FEELS REAL TIRED AND SLEEPY ? How is itching? (we've been reducing Hydralazine to see if it helps with the itching) SHE STATES IT MIGHT BE A LITTLE BETTER

## 2023-02-25 NOTE — Telephone Encounter (Signed)
If we could just call to check in as has not read MyChart.  When getting low BP in the morning is it pre/post medications? Ensure Hydralazine 12 hours apart and Doxazosin in the evening.  How does she feel with low readings? If feels fine, less concerning.  How is itching? (we've been reducing Hydralazine to see if it helps with the itching)  TY!

## 2023-02-25 NOTE — Telephone Encounter (Signed)
Let's reduce the Doxazosin from 4mg  at bedtime to 3mg  at bedtime. Continue Hydralazine, Spironolactone at present doses.   Alver Sorrow, NP

## 2023-02-25 NOTE — Telephone Encounter (Signed)
  Returned call to patient, reviewed recommendations, patient verbalizes understanding. Rx to pharm!      "Let's reduce the Doxazosin from 4mg  at bedtime to 3mg  at bedtime. Continue Hydralazine, Spironolactone at present doses.    Alver Sorrow, NP"

## 2023-02-25 NOTE — Addendum Note (Signed)
Addended by: Marlene Lard on: 02/25/2023 10:00 AM   Modules accepted: Orders

## 2023-03-01 DIAGNOSIS — C44619 Basal cell carcinoma of skin of left upper limb, including shoulder: Secondary | ICD-10-CM | POA: Diagnosis not present

## 2023-03-01 DIAGNOSIS — L720 Epidermal cyst: Secondary | ICD-10-CM | POA: Diagnosis not present

## 2023-03-01 DIAGNOSIS — L821 Other seborrheic keratosis: Secondary | ICD-10-CM | POA: Diagnosis not present

## 2023-03-01 DIAGNOSIS — D485 Neoplasm of uncertain behavior of skin: Secondary | ICD-10-CM | POA: Diagnosis not present

## 2023-03-07 ENCOUNTER — Encounter: Payer: Self-pay | Admitting: Family Medicine

## 2023-03-13 ENCOUNTER — Encounter (HOSPITAL_BASED_OUTPATIENT_CLINIC_OR_DEPARTMENT_OTHER): Payer: Self-pay

## 2023-03-13 DIAGNOSIS — I10 Essential (primary) hypertension: Secondary | ICD-10-CM

## 2023-03-13 DIAGNOSIS — I959 Hypotension, unspecified: Secondary | ICD-10-CM

## 2023-03-15 DIAGNOSIS — I959 Hypotension, unspecified: Secondary | ICD-10-CM | POA: Diagnosis not present

## 2023-03-16 LAB — BASIC METABOLIC PANEL
BUN/Creatinine Ratio: 21 (ref 12–28)
BUN: 26 mg/dL (ref 10–36)
CO2: 27 mmol/L (ref 20–29)
Calcium: 9.7 mg/dL (ref 8.7–10.3)
Chloride: 98 mmol/L (ref 96–106)
Creatinine, Ser: 1.25 mg/dL — ABNORMAL HIGH (ref 0.57–1.00)
Glucose: 96 mg/dL (ref 70–99)
Potassium: 4.5 mmol/L (ref 3.5–5.2)
Sodium: 138 mmol/L (ref 134–144)
eGFR: 41 mL/min/{1.73_m2} — ABNORMAL LOW (ref >59)

## 2023-03-16 LAB — CBC
Hematocrit: 48.9 % — ABNORMAL HIGH (ref 34.0–46.6)
Hemoglobin: 15.7 g/dL (ref 11.1–15.9)
MCH: 31.5 pg (ref 26.6–33.0)
MCHC: 32.1 g/dL (ref 31.5–35.7)
MCV: 98 fL — ABNORMAL HIGH (ref 79–97)
Platelets: 202 10*3/uL (ref 150–450)
RBC: 4.98 x10E6/uL (ref 3.77–5.28)
RDW: 12.3 % (ref 11.7–15.4)
WBC: 5.8 10*3/uL (ref 3.4–10.8)

## 2023-03-19 ENCOUNTER — Other Ambulatory Visit: Payer: Self-pay | Admitting: Allergy and Immunology

## 2023-03-22 DIAGNOSIS — J9610 Chronic respiratory failure, unspecified whether with hypoxia or hypercapnia: Secondary | ICD-10-CM | POA: Diagnosis not present

## 2023-03-22 DIAGNOSIS — G4733 Obstructive sleep apnea (adult) (pediatric): Secondary | ICD-10-CM | POA: Diagnosis not present

## 2023-03-29 MED ORDER — CARVEDILOL 3.125 MG PO TABS: 3.1250 mg | ORAL_TABLET | Freq: Two times a day (BID) | ORAL | 1 refills | Status: DC

## 2023-03-29 NOTE — Addendum Note (Signed)
Addended by: Alver Sorrow on: 03/29/2023 03:23 PM   Modules accepted: Orders

## 2023-03-29 NOTE — Telephone Encounter (Signed)
Pt's bp log attached.

## 2023-04-02 ENCOUNTER — Encounter: Payer: Self-pay | Admitting: Family Medicine

## 2023-04-06 ENCOUNTER — Ambulatory Visit (HOSPITAL_BASED_OUTPATIENT_CLINIC_OR_DEPARTMENT_OTHER): Payer: PPO | Admitting: Cardiovascular Disease

## 2023-04-06 ENCOUNTER — Encounter (HOSPITAL_BASED_OUTPATIENT_CLINIC_OR_DEPARTMENT_OTHER): Payer: Self-pay

## 2023-04-06 ENCOUNTER — Encounter (HOSPITAL_BASED_OUTPATIENT_CLINIC_OR_DEPARTMENT_OTHER): Payer: Self-pay | Admitting: Cardiovascular Disease

## 2023-04-06 VITALS — BP 162/94 | HR 98 | Ht 60.0 in | Wt 172.7 lb

## 2023-04-06 DIAGNOSIS — I4892 Unspecified atrial flutter: Secondary | ICD-10-CM | POA: Diagnosis not present

## 2023-04-06 DIAGNOSIS — I10 Essential (primary) hypertension: Secondary | ICD-10-CM | POA: Diagnosis not present

## 2023-04-06 DIAGNOSIS — I491 Atrial premature depolarization: Secondary | ICD-10-CM | POA: Diagnosis not present

## 2023-04-06 DIAGNOSIS — I340 Nonrheumatic mitral (valve) insufficiency: Secondary | ICD-10-CM | POA: Diagnosis not present

## 2023-04-06 DIAGNOSIS — I150 Renovascular hypertension: Secondary | ICD-10-CM | POA: Diagnosis not present

## 2023-04-06 DIAGNOSIS — I4819 Other persistent atrial fibrillation: Secondary | ICD-10-CM

## 2023-04-06 MED ORDER — CARVEDILOL 12.5 MG PO TABS: 12.5000 mg | ORAL_TABLET | Freq: Two times a day (BID) | ORAL | 1 refills | Status: DC

## 2023-04-06 NOTE — Progress Notes (Unsigned)
Advanced Hypertension Clinic Follow Up:    Date:  04/06/2023   ID:  Brooke Beasley, DOB May 18, 1930, MRN [id number]  PCP:  Eustaquio Boyden, MD  Cardiologist:  None  Nephrologist:  Referring MD: Eustaquio Boyden, MD   CC: Hypertension  History of Present Illness:    Brooke Beasley is a 89 y.o. female with a hx of persistent atrial fibrillation/flutter status post cardioversion, OSA on CPAP, HFpEF, hypertension, hyperlipidemia, CKD 3, mild aortic stenosis, and anxiety here to establish care in the Advanced Hypertension Clinic.  She has a history of a history of atrial fibrillation.  Ventricular rates were slow so carvedilol was reduced to 3.125 mg twice daily.  Blood pressure was elevated when she saw Dr. Bethena Roys on 06/2022 but had generally been controlled.  At her visit 09/2022 she noted that her blood pressures were very labile ranging from the 90s to the 200s.  It started to become more difficult to control after surgery for rectal prolapse.  She also noted exertional dyspnea.  Amlodipine was increased to twice daily.  She had an echo 11/2022 that revealed LVEF 65-70% with mild LVH and mildly reduced RV function.  She had mild posterior mitral valve leaflet prolapse and mild to moderate mitral regurgitation.  She had mild to moderate aortic stenosis as well.  Nuclear stress testing was low risk.  She saw Gillian Shields, NP 12/2022 and had self discontinued amlodipine due to itching.  Blood pressure at home was averaging 137/79.  There was concern hydralazine may be contributing to the itching so it was reduced and doxazosin was added.    Ms. Osmanski presents a log of her BP at home which averages 148/92.        Previous antihypertensives: ACE-I Carvedilol Chlorthalidone Losartan metoprolol Atenolol Chlorthalidone Hydrochlorothiazide Irbesartan Imdur Hydrochlorothiazide-triamterene  Past Medical History:  Diagnosis Date   Anxiety    Basal cell carcinoma,  arm, left 02/2023   L mid lateral posterior arm ([place])   C. difficile diarrhea 07/11/2018   S/p multiple oral vanc treatments (course and tapers) and completed Zinplava monoclonal Ab treatment (05/10/2019) Fecal transplant on hold during COVID pandemic   CAP (community acquired pneumonia) 12/22/2017   Carotid stenosis 10/18/2014   R 1-39%, L 40-59%, rpt 1 yr (09/2014)    CKD (chronic kidney disease) stage 3, GFR 30-59 ml/min (HCC) 08/02/2014   Closed fracture of right orbit (HCC) 02/10/2021   Clostridioides difficile infection    hx 2020   COVID-19 virus infection 10/13/2020   Degenerative disc disease    LS   Depression    nervous breakdonw in 1964-out of work for a year   Diverticulosis    severe by colonoscopy   Family history of adverse reaction to anesthesia    n/v   GERD (gastroesophageal reflux disease)    Glaucoma    Heart murmur 08/2011   mitral regurge - on echo    History of hiatal hernia    History of shingles    HTN (hypertension)    Hyperlipidemia    Hypertension    Hypothyroidism    IBS 11/02/2006   Influenza A 04/20/2022   Intestinal bacterial overgrowth    In small colon   Left shoulder pain 11/04/2014   Lower GI bleed 12/2020   thought diverticular complicated by ABLA with syncope and orbital fracture s/p hospitalization   Maxillary fracture (HCC) 04/08/2012   Osteoarthritis 2016   [place])   Osteoporosis    dexa 2011   Perirectal  abscess 08/03/2021   CT guided aspiration of abscess grow Proteums mirabilis and Citrobacter koseri 07/2021, currently receiving IV Zosyn planned total 6 wks.   Pseudogout 2016   shoulders ([place])   Strep pharyngitis 04/20/2022    Past Surgical History:  Procedure Laterality Date   barium enema  2012   severe diverticulosis, redundant colon   Bowel obstruction  1999   no surgery in hosp x 3 days   BUBBLE STUDY  07/27/2021   Procedure: BUBBLE STUDY;  Surgeon: Orpah Cobb, MD;  Location: [facility] ENDOSCOPY;  Service:  Cardiovascular;;   CARDIOVERSION N/A 11/19/2021   Procedure: CARDIOVERSION;  Surgeon: Debbe Odea, MD;  Location: [facility] ORS;  Service: Cardiovascular;  Laterality: N/A;   COLONOSCOPY  1. 1999  2. 11/04   1. Not finished  2. Slight hemorrhage rectosigmoid area, severe sig diverticulosis   COLONOSCOPY WITH PROPOFOL N/A 09/10/2019   SSP with dysplasia, TA, rpt 3 yrs Allegra Lai, Loel Dubonnet, MD)   Dexa  1. [phone]   2. 9/04  3. 3/08    1. OP  2. OP, borderline, spine -2.44T  3. decreased BMD-OP   DG KNEE 1-2 VIEWS BILAT     LS x-ray with degenerative disc and facet change   ESOPHAGOGASTRODUODENOSCOPY     Negative   FLEXIBLE SIGMOIDOSCOPY N/A 01/16/2021   Procedure: FLEXIBLE SIGMOIDOSCOPY;  Surgeon: Iva Boop, MD;  Location: [facility] ENDOSCOPY;  Service: Endoscopy;  Laterality: N/A;  or unsedated   Hemorrhoid procedure  07/2006   PICC LINE REMOVAL ([facility] HX)  08/28/2021   PROCTOSCOPY N/A 05/26/2021   Procedure: RIGID PROCTOSCOPY;  Surgeon: Karie Soda, MD;  Location: [facility] ORS;  Service: General;  Laterality: N/A;   RECTOPEXY N/A 05/26/2021   Procedure: RECTOPEXY;  Surgeon: Karie Soda, MD;  Location: [facility] ORS;  Service: General;  Laterality: N/A;   TEE WITHOUT CARDIOVERSION N/A 07/27/2021   Procedure: TRANSESOPHAGEAL ECHOCARDIOGRAM (TEE);  Surgeon: Orpah Cobb, MD;  Location: [facility] ENDOSCOPY;  Service: Cardiovascular;  Laterality: N/A;   TONSILLECTOMY     TOTAL SHOULDER ARTHROPLASTY Left 11/27/2015   Christena Flake, MD   TOTAL SHOULDER REVISION Left 03/16/2016   Procedure: TOTAL SHOULDER REVISION;  Surgeon: Christena Flake, MD;  Location: [facility] ORS;  Service: Orthopedics;  Laterality: Left;   US ECHOCARDIOGRAPHY  07/2011   Normal systolic fxn with EF 55-60%.  Focal basal septal hypertrophy.  Mild diastolic dysfunction.  Mild MR.   XI ROBOTIC ASSISTED LOWER ANTERIOR RESECTION N/A 05/26/2021   Procedure: ROBOTIC LOW ANTERIOR RECTOSIGMOID RESECTION; ASSESSMENT OF TISSUE PERFUSION WITH FIREFLY;   Surgeon: Karie Soda, MD;  Location: [facility] ORS;  Service: General;  Laterality: N/A;    Current Medications: No outpatient medications have been marked as taking for the 04/06/23 encounter (Appointment) with Chilton Si, MD.   Current Facility-Administered Medications for the 04/06/23 encounter (Appointment) with Chilton Si, MD  Medication   omalizumab Geoffry Paradise) prefilled syringe 150 mg     Allergies:   Ace inhibitors, Amlodipine, Carvedilol, Chlorthalidone, Montelukast, Other, Risedronate sodium, Simvastatin, Alendronate sodium, Influenza vaccines, Losartan, Paroxetine hcl, Silenor [doxepin hcl], Sulfonamide derivatives, and Toprol xl [metoprolol]   Social History   Socioeconomic History   Marital status: Widowed    Spouse name: Not on file   Number of children: 2   Years of education: Not on file   Highest education level: 12th grade  Occupational History   Occupation: Retired    Associate Professor: RETIRED  Tobacco Use   Smoking status: Never   Smokeless tobacco:  Never  Vaping Use   Vaping status: Never Used  Substance and Sexual Activity   Alcohol use: No   Drug use: No   Sexual activity: Not Currently  Other Topics Concern   Not on file  Social History Narrative   Left handed   Widow. Husband ([name]) deceased 04-07-16 from dementia. She was caregiver.    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   Activity: no regular exercise   Diet: good water, fruits/vegetables daily      Patient Care Team:   Eustaquio Boyden, MD as PCP - General (Family Medicine)   Debbe Odea, MD as PCP - Cardiology (Cardiology)   Otho Ket, RN as [facility] Care Management   Gross, Viviann Spare, MD as Consulting Physician (General Surgery)   Toney Reil, MD as Consulting Physician (Gastroenterology)   Debbe Odea, MD as Consulting Physician (Cardiology)   Social Determinants of Health    Financial Resource Strain: Patient Declined (08/24/2022)   Overall Financial Resource Strain (CARDIA)    Difficulty of Paying Living Expenses: Patient declined  Food Insecurity: No Food Insecurity (08/24/2022)   Hunger Vital Sign    Worried About Running Out of Food in the Last Year: Never true    Ran Out of Food in the Last Year: Never true  Transportation Needs: Unknown (08/24/2022)   PRAPARE - Transportation    Lack of Transportation (Medical): No    Lack of Transportation (Non-Medical): Not on file  Physical Activity: Unknown (08/24/2022)   Exercise Vital Sign    Days of Exercise per Week: 0 days    Minutes of Exercise per Session: Not on file  Stress: Patient Declined (08/24/2022)   [employer] of Occupational Health - Occupational Stress Questionnaire    Feeling of Stress : Patient declined  Social Connections: Moderately Isolated (08/24/2022)   Social Connection and Isolation Panel [NHANES]    Frequency of Communication with Friends and Family: More than three times a week    Frequency of Social Gatherings with Friends and Family: Once a week    Attends Religious Services: 1 to 4 times per year    Active Member of [employer] or Organizations: No    Attends Banker Meetings: Not on file    Marital Status: Widowed     Family History: The patient's family history includes Breast cancer in her cousin; Coronary artery disease in her brother; Diabetes in her brother and paternal grandfather.  ROS:   Please see the history of present illness.     All other systems reviewed and are negative.  EKGs/Labs/Other Studies Reviewed:    EKG:  EKG is not ordered today.    Recent Labs: 06/16/2022: Magnesium 1.9 08/06/2022: ALT 25 10/06/2022: TSH 4.81 03/15/2023: BUN 26; Creatinine, Ser 1.25; Hemoglobin 15.7; Platelets 202; Potassium 4.5; Sodium 138   Recent Lipid Panel    Component Value Date/Time   CHOL 181 08/06/2022 0841   CHOL 179 03/27/2021 0935   TRIG 79.0  08/06/2022 0841   HDL 64.70 08/06/2022 0841   HDL 71 03/27/2021 0935   CHOLHDL 3 08/06/2022 0841   VLDL 15.8 08/06/2022 0841   LDLCALC 101 (H) 08/06/2022 0841   LDLCALC 95 03/27/2021 0935   LDLDIRECT 86.0 09/19/2014 1049    Physical Exam:   VS:  There were no vitals taken for this visit. , BMI There is no height or weight on file to  calculate BMI. GENERAL:  Well appearing HEENT: Pupils equal round and reactive, fundi not visualized, oral mucosa unremarkable NECK:  No jugular venous distention, waveform within normal limits, carotid upstroke brisk and symmetric, no bruits, no thyromegaly LUNGS:  Clear to auscultation bilaterally HEART:  RRR.  PMI not displaced or sustained,S1 and S2 within normal limits, no S3, no S4, no clicks, no rubs, III/VI mid-peaking systolic murmur at the LUSB ABD:  Flat, positive bowel sounds normal in frequency in pitch, no bruits, no rebound, no guarding, no midline pulsatile mass, no hepatomegaly, no splenomegaly EXT:  2 plus pulses throughout, no edema, no cyanosis no clubbing SKIN:  No rashes no nodules NEURO:  Cranial nerves II through XII grossly intact, motor grossly intact throughout PSYCH:  Cognitively intact, oriented to person place and time   ASSESSMENT/PLAN:           Screening for Secondary Hypertension:     10/18/2022    3:11 PM  Causes  Drugs/Herbals Screened     - Comments limits added salt.  No caffeine or alcohol.  Renovascular HTN Screened     - Comments abdominal CT-A. Renal Doppler inconclusive 08/2021  Sleep Apnea Screened     - Comments uses CPAP  Thyroid Disease Screened  Hyperaldosteronism Screened     - Comments check renin and aldosterone  Pheochromocytoma Screened     - Comments check catecholamines and metanephrines  Cushing's Syndrome N/A  Hyperparathyroidism Screened  Coarctation of the Aorta Screened  Compliance Screened    Relevant Labs/Studies:    Latest Ref Rng & Units 03/15/2023    9:37 AM 10/19/2022     8:45 AM 10/06/2022   12:47 PM  Basic Labs  Sodium 134 - 144 mmol/L 138  139  137   Potassium 3.5 - 5.2 mmol/L 4.5  4.4  4.2   Creatinine 0.57 - 1.00 mg/dL 9.62  9.52  8.41        Latest Ref Rng & Units 10/06/2022   12:47 PM 06/16/2022   10:03 AM  Thyroid   TSH 0.35 - 5.50 uIU/mL 4.81  1.75        Latest Ref Rng & Units 10/19/2022    8:45 AM  Renin/Aldosterone   Aldosterone 0.0 - 30.0 ng/dL 32.4   Aldos/Renin Ratio 0.0 - 30.0 8.9        Latest Ref Rng & Units 10/19/2022    8:45 AM  Metanephrines/Catecholamines   Epinephrine 0 - 62 pg/mL 33   Norepinephrine 0 - 874 pg/mL 1,538   Dopamine 0 - 48 pg/mL <30   Metanephrines 0.0 - 88.0 pg/mL <25.0   Normetanephrines  0.0 - 297.2 pg/mL 132.4        Latest Ref Rng & Units 09/07/2021    4:31 AM  Cortisol  Cortisol  ug/dL 40.1        0/05/7251    9:03 AM  Renovascular   Renal Artery [place] Completed Yes     Disposition:    FU with MD/PharmD in 1 month    Medication Adjustments/Labs and Tests Ordered: Current medicines are reviewed at length with the patient today.  Concerns regarding medicines are outlined above.  No orders of the defined types were placed in this encounter.  No orders of the defined types were placed in this encounter.    Signed, Chilton Si, MD  04/06/2023 1:03 PM    [facility]

## 2023-04-06 NOTE — Patient Instructions (Signed)
Medication Instructions:  STOP DOXAZOSIN   STOP SPIRONOLACTONE   INCREASE YOUR CARVEDILOL TO 12.5 MG TWICE A DAY   Labwork: NONE  Testing/Procedures: NONE  Follow-Up: 1 TO 2 MONTHS WITH CAITLIN W NP OR DR Chauncey IN ADV HTN CLINIC   If you need a refill on your cardiac medications before your next appointment, please call your pharmacy.

## 2023-04-07 NOTE — Telephone Encounter (Signed)
Addressed at clinic visit 04/06/23 with Dr. Joesphine Bare, NP

## 2023-04-22 ENCOUNTER — Encounter: Payer: Self-pay | Admitting: Family Medicine

## 2023-04-22 ENCOUNTER — Ambulatory Visit (INDEPENDENT_AMBULATORY_CARE_PROVIDER_SITE_OTHER): Payer: PPO | Admitting: Family Medicine

## 2023-04-22 ENCOUNTER — Telehealth: Payer: Self-pay

## 2023-04-22 VITALS — BP 138/82 | HR 63 | Temp 97.7°F | Ht 60.0 in | Wt 171.0 lb

## 2023-04-22 DIAGNOSIS — M150 Primary generalized (osteo)arthritis: Secondary | ICD-10-CM

## 2023-04-22 DIAGNOSIS — M810 Age-related osteoporosis without current pathological fracture: Secondary | ICD-10-CM | POA: Diagnosis not present

## 2023-04-22 DIAGNOSIS — I150 Renovascular hypertension: Secondary | ICD-10-CM

## 2023-04-22 DIAGNOSIS — I38 Endocarditis, valve unspecified: Secondary | ICD-10-CM | POA: Diagnosis not present

## 2023-04-22 DIAGNOSIS — M1120 Other chondrocalcinosis, unspecified site: Secondary | ICD-10-CM | POA: Diagnosis not present

## 2023-04-22 DIAGNOSIS — L299 Pruritus, unspecified: Secondary | ICD-10-CM | POA: Diagnosis not present

## 2023-04-22 MED ORDER — FAMOTIDINE 20 MG PO TABS: 20.0000 mg | ORAL_TABLET | Freq: Two times a day (BID) | ORAL | 3 refills | Status: AC

## 2023-04-22 MED ORDER — GABAPENTIN 300 MG PO CAPS: ORAL_CAPSULE | ORAL | 3 refills | Status: DC

## 2023-04-22 MED ORDER — CETIRIZINE HCL 10 MG PO TABS: 10.0000 mg | ORAL_TABLET | Freq: Every day | ORAL | 3 refills | Status: DC

## 2023-04-22 NOTE — Progress Notes (Signed)
Ph: [phone] Fax: [phone]   Patient ID: Brooke Beasley, female    DOB: March 07, 1931, 89 y.o.   MRN: [id number]  This visit was conducted in person.  BP 138/82   Pulse 63   Temp 97.7 F (36.5 C) (Oral)   Ht 5' (1.524 m)   Wt 171 lb (77.6 kg)   SpO2 97%   BMI 33.40 kg/m    CC: 4 mo f/u visit  Subjective:   HPI: Brooke Beasley is a 89 y.o. female presenting on 04/22/2023 for Medical Management of Chronic Issues (Here for 4 mo f/u. Pt accompanied by daughter, [name]. )   HTN - recently established with cardiology Dr. Duke Salvia for difficult to control renovascular hypertension. Compliant with current antihypertensive regimen of carvedilol 12.5 mg twice daily, furosemide 20 mg daily. BP stable today. Spironolactone and doxazosin were recently stopped as well as hydroxyzine. Amlodipine was discontinued due to itching. Does check blood pressures at home: See today's MyChart message for recent home blood pressure readings, 2 wk average 145/90. No low blood pressure readings or symptoms of dizziness/syncope.  Denies HA, vision changes, CP/tightness, SOB, leg swelling.    She recently noted darker stools as well as ongoing fatigue and itching.  No recent Pepto-Bismol use but she does regularly take oral iron and blueberry capsule.  Colonoscopy 08/2019 - 2 SSP, TA, hemorrhoids, diverticulosis, rectal prolapse ([place])   Released  from allergist Dr. Lucie Leather care - was seeing for idiopathic urticaria and inflammatory pruritic dermatosis with nodular dermatitis. Itching has been doing better. Omalizumab shots were stopped.   She last saw rheumatology Dr. Allena Katz 02/23/2023 for pseudogout and arthritis of right shoulder treating with colchicine 0.6 mg daily.  She previously did not tolerate Plaquenil.  OP - on prolia since ~04/2019, latest Prolia injection 09/28/2022 with possible reaction-facial swelling, throat tightness, right greater than left foot swelling, right great toe  numbness.  No hives, abdominal pain, nausea. Discussed options going forward.  DEXA 07/2020 - T -4.3 at forearm, -1.3 at L femoral neck      Relevant past medical, surgical, family and social history reviewed and updated as indicated. Interim medical history since our last visit reviewed. Allergies and medications reviewed and updated. Outpatient Medications Prior to Visit  Medication Sig Dispense Refill   apixaban (ELIQUIS) 5 MG TABS tablet Take 1 tablet (5 mg total) by mouth 2 (two) times daily. 60 tablet 11   Ascorbic Acid (VITAMIN C PO) Take by mouth.     bimatoprost (LUMIGAN) 0.01 % SOLN Place 1 drop into both eyes at bedtime.     carvedilol (COREG) 12.5 MG tablet Take 1 tablet (12.5 mg total) by mouth 2 (two) times daily with a meal. 180 tablet 1   cholecalciferol (VITAMIN D) 1000 units tablet Take 1,000 Units by mouth daily.     clonazePAM (KLONOPIN) 0.5 MG tablet TAKE 1 TABLET BY MOUTH AT BEDTIME 30 tablet 3   colchicine 0.6 MG tablet Take 1 tablet (0.6 mg total) by mouth daily. 90 tablet 1   DULoxetine (CYMBALTA) 20 MG capsule Take 1 capsule (20 mg total) by mouth daily. 90 capsule 4   EPINEPHrine (EPIPEN 2-PAK) 0.3 mg/0.3 mL IJ SOAJ injection Inject 0.3 mg into the muscle as needed for anaphylaxis. Geoffry Paradise INJECTION PROTOCOL 2 each 2   Ferrous Sulfate (IRON) 28 MG TABS Take 1 tablet (28 mg total) by mouth every other day.     furosemide (LASIX) 20 MG tablet Take 1 tablet (20 mg  total) by mouth daily. 90 tablet 4   levothyroxine (SYNTHROID) 100 MCG tablet Take 1 tablet (100 mcg total) by mouth daily before breakfast. 90 tablet 4   loperamide (IMODIUM) 2 MG capsule Take 1 capsule (2 mg total) by mouth 3 (three) times daily as needed for diarrhea or loose stools. 30 capsule 0   mometasone (ELOCON) 0.1 % ointment Apply topically daily. Shower than ointment applied while still wet daily 135 g 1   Multiple Vitamin (MULTIVITAMIN ADULT PO) Take by mouth daily.     polyethylene glycol (MIRALAX  / GLYCOLAX) 17 g packet Take 17 g by mouth daily.     Potassium 99 MG TABS Take 1 tablet (99 mg total) by mouth every Monday, Wednesday, and Friday. 330 tablet    Simethicone 180 MG CAPS Take 1 capsule by mouth daily as needed (indigestion).     timolol (TIMOPTIC) 0.5 % ophthalmic solution Place 1 drop into both eyes every morning.     cetirizine (ZYRTEC) 10 MG tablet Take 1 tablet by mouth once daily 90 tablet 0   famotidine (PEPCID) 20 MG tablet Take 1 tablet (20 mg total) by mouth 2 (two) times daily. 180 tablet 1   gabapentin (NEURONTIN) 300 MG capsule TAKE 1 CAPSULE BY MOUTH ONCE DAILY AS NEEDED FOR  NERVE  PAIN  (ITCHING) 90 capsule 0   hydrALAZINE (APRESOLINE) 25 MG tablet Take 1 tablet (25 mg total) by mouth in the morning and at bedtime. (Patient not taking: Reported on 04/22/2023) 180 tablet 3   omalizumab (XOLAIR) prefilled syringe 150 mg      No facility-administered medications prior to visit.     Per HPI unless specifically indicated in ROS section below Review of Systems  Objective:  BP 138/82   Pulse 63   Temp 97.7 F (36.5 C) (Oral)   Ht 5' (1.524 m)   Wt 171 lb (77.6 kg)   SpO2 97%   BMI 33.40 kg/m   Wt Readings from Last 3 Encounters:  04/22/23 171 lb (77.6 kg)  04/06/23 172 lb 11.2 oz (78.3 kg)  02/15/23 170 lb 14.4 oz (77.5 kg)      Physical Exam Vitals and nursing note reviewed.  Constitutional:      Appearance: Normal appearance. She is not ill-appearing.  HENT:     Mouth/Throat:     Mouth: Mucous membranes are moist.     Pharynx: Oropharynx is clear. No oropharyngeal exudate or posterior oropharyngeal erythema.  Eyes:     Extraocular Movements: Extraocular movements intact.     Pupils: Pupils are equal, round, and reactive to light.  Cardiovascular:     Rate and Rhythm: Normal rate and regular rhythm.     Pulses: Normal pulses.     Heart sounds: Murmur (3/6 systolic USB) heard.  Pulmonary:     Effort: Pulmonary effort is normal. No respiratory  distress.     Breath sounds: Normal breath sounds. No wheezing, rhonchi or rales.  Musculoskeletal:        General: Deformity present.     Right lower leg: No edema.     Left lower leg: No edema.     Comments: L middle finger hypertrophy of DIP, PIP   Skin:    General: Skin is warm and dry.     Findings: No rash.  Neurological:     Mental Status: She is alert.  Psychiatric:        Mood and Affect: Mood normal.  Behavior: Behavior normal.       Results for orders placed or performed in visit on 03/13/23  CBC   Collection Time: 03/15/23  9:37 AM  Result Value Ref Range   WBC 5.8 3.4 - 10.8 x10E3/uL   RBC 4.98 3.77 - 5.28 x10E6/uL   Hemoglobin 15.7 11.1 - 15.9 g/dL   Hematocrit 16.1 (H) 09.6 - 46.6 %   MCV 98 (H) 79 - 97 fL   MCH 31.5 26.6 - 33.0 pg   MCHC 32.1 31.5 - 35.7 g/dL   RDW 04.5 40.9 - 81.1 %   Platelets 202 150 - 450 x10E3/uL  Basic metabolic panel   Collection Time: 03/15/23  9:37 AM  Result Value Ref Range   Glucose 96 70 - 99 mg/dL   BUN 26 10 - 36 mg/dL   Creatinine, Ser 9.14 (H) 0.57 - 1.00 mg/dL   eGFR 41 (L) >78 GN/FAO/1.30   BUN/Creatinine Ratio 21 12 - 28   Sodium 138 134 - 144 mmol/L   Potassium 4.5 3.5 - 5.2 mmol/L   Chloride 98 96 - 106 mmol/L   CO2 27 20 - 29 mmol/L   Calcium 9.7 8.7 - 10.3 mg/dL   *Note: Due to a large number of results and/or encounters for the requested time period, some results have not been displayed. A complete set of results can be found in Results Review.    Assessment & Plan:   Problem List Items Addressed This Visit     Hypertension   Chronic, stable only on carvedilol 12.5mg  bid.  Appreciate cardiology team care.       Osteoarthritis   Appreciate rheum care.       Osteoporosis - Primary   Last Prolia 09/2022 - possible drug reaction so planning to stay off Prolia.  Recommend treatment with another bone strengthening med while tapering off Prolia.  Discussed IV Reclast infusion - as well as risk of  atypical hip fractures and osteonecrosis of jaw, need to hold med if any planned deep dental work. She does have listed intolerances to oral alendronate (palpitations) and risedronate (arthralgias).  Would avoid estrogenic medications that could increase cardiovascular risk.  She decides she wants to hold off on medicines for osteoporosis at this time.  Update BMP, vit D      Relevant Orders   Basic metabolic panel   VITAMIN D 25 Hydroxy (Vit-D Deficiency, Fractures)   Valvular heart disease   Echo 12/2022 - mod MR, mild-mod AS, unchanged from prior, normal EF      Pseudogout   Appreciate rheum care.       Pruritic condition   Released from allergist care. Pruritic condition may be some better off most antihypertensives.         Meds ordered this encounter  Medications   gabapentin (NEURONTIN) 300 MG capsule    Sig: TAKE 1 CAPSULE BY MOUTH ONCE DAILY AS NEEDED FOR  NERVE  PAIN  (ITCHING)    Dispense:  90 capsule    Refill:  3   cetirizine (ZYRTEC) 10 MG tablet    Sig: Take 1 tablet (10 mg total) by mouth daily.    Dispense:  90 tablet    Refill:  3   famotidine (PEPCID) 20 MG tablet    Sig: Take 1 tablet (20 mg total) by mouth 2 (two) times daily.    Dispense:  180 tablet    Refill:  3    Orders Placed This Encounter  Procedures  Basic metabolic panel   VITAMIN D 25 Hydroxy (Vit-D Deficiency, Fractures)    Patient Instructions  We will price out Reclast once yearly infusion  through the [facility] in [place].  Good to see you today Return as needed or in 4 months for physical/wellness visit   Follow up plan: Return in about 4 months (around 08/21/2023) for annual exam, prior fasting for blood work, medicare wellness visit.  Eustaquio Boyden, MD

## 2023-04-22 NOTE — Assessment & Plan Note (Signed)
Appreciate rheum care.

## 2023-04-22 NOTE — Patient Instructions (Addendum)
We will price out Reclast once yearly infusion  through the [facility] in [place].  Good to see you today Return as needed or in 4 months for physical/wellness visit

## 2023-04-22 NOTE — Telephone Encounter (Signed)
Actually can we cancel this order? After further discussion pt declines Reclast given prior intolerances to oral bisphosphonates.

## 2023-04-22 NOTE — Assessment & Plan Note (Signed)
Chronic, stable only on carvedilol 12.5mg  bid.  Appreciate cardiology team care.

## 2023-04-22 NOTE — Telephone Encounter (Signed)
Dr. Sharen Hones, patient will be scheduled as soon as possible.  Auth Submission: NO AUTH NEEDED Site of care: Site of care: [facility] Payer: [employer] Medication & CPT/J Code(s) submitted: Reclast (Zolendronic acid) [id number] Route of submission (phone, fax, portal):  Phone # Fax # Auth type: Buy/Bill PB Units/visits requested: 5mg  x 1 dose Reference number:  Approval from: 04/22/23 to 05/27/23

## 2023-04-22 NOTE — Assessment & Plan Note (Signed)
Released from allergist care. Pruritic condition may be some better off most antihypertensives.

## 2023-04-22 NOTE — Assessment & Plan Note (Addendum)
Last Prolia 09/2022 - possible drug reaction so planning to stay off Prolia.  Recommend treatment with another bone strengthening med while tapering off Prolia.  Discussed IV Reclast infusion - as well as risk of atypical hip fractures and osteonecrosis of jaw, need to hold med if any planned deep dental work. She does have listed intolerances to oral alendronate (palpitations) and risedronate (arthralgias).  Would avoid estrogenic medications that could increase cardiovascular risk.  She decides she wants to hold off on medicines for osteoporosis at this time.  Update BMP, vit D

## 2023-04-22 NOTE — Assessment & Plan Note (Signed)
Echo 12/2022 - mod MR, mild-mod AS, unchanged from prior, normal EF

## 2023-04-23 LAB — BASIC METABOLIC PANEL
BUN/Creatinine Ratio: 15 (calc) (ref 6–22)
BUN: 17 mg/dL (ref 7–25)
CO2: 30 mmol/L (ref 20–32)
Calcium: 9.5 mg/dL (ref 8.6–10.4)
Chloride: 98 mmol/L (ref 98–110)
Creat: 1.12 mg/dL — ABNORMAL HIGH (ref 0.60–0.95)
Glucose, Bld: 75 mg/dL (ref 65–99)
Potassium: 4 mmol/L (ref 3.5–5.3)
Sodium: 138 mmol/L (ref 135–146)

## 2023-04-23 LAB — VITAMIN D 25 HYDROXY (VIT D DEFICIENCY, FRACTURES): Vit D, 25-Hydroxy: 42 ng/mL (ref 30–100)

## 2023-04-25 NOTE — Telephone Encounter (Signed)
Discussed at OV

## 2023-04-28 ENCOUNTER — Ambulatory Visit: Payer: Self-pay | Admitting: Family Medicine

## 2023-04-28 ENCOUNTER — Telehealth: Payer: PPO | Admitting: Nurse Practitioner

## 2023-04-28 DIAGNOSIS — J0140 Acute pansinusitis, unspecified: Secondary | ICD-10-CM

## 2023-04-28 MED ORDER — AMOXICILLIN-POT CLAVULANATE 875-125 MG PO TABS: 1.0000 | ORAL_TABLET | Freq: Two times a day (BID) | ORAL | 0 refills | Status: AC

## 2023-04-28 NOTE — Progress Notes (Signed)
 Virtual Visit Consent   Brooke Beasley, you are scheduled for a virtual visit with a [facility] provider today. Just as with appointments in the office, your consent must be obtained to participate. Your consent will be active for this visit and any virtual visit you may have with one of our providers in the next 365 days. If you have a MyChart account, a copy of this consent can be sent to you electronically.  As this is a virtual visit, video technology does not allow for your provider to perform a traditional examination. This may limit your provider's ability to fully assess your condition. If your provider identifies any concerns that need to be evaluated in person or the need to arrange testing (such as labs, EKG, etc.), we will make arrangements to do so. Although advances in technology are sophisticated, we cannot ensure that it will always work on either your end or our end. If the connection with a video visit is poor, the visit may have to be switched to a telephone visit. With either a video or telephone visit, we are not always able to ensure that we have a secure connection.  By engaging in this virtual visit, you consent to the provision of healthcare and authorize for your insurance to be billed (if applicable) for the services provided during this visit. Depending on your insurance coverage, you may receive a charge related to this service.  I need to obtain your verbal consent now. Are you willing to proceed with your visit today? [name] has provided verbal consent on 04/28/2023 for a virtual visit (video or telephone). [name], FNP  Date: 04/28/2023 12:13 PM  Virtual Visit via Video Note   I, [name], connected with  Brooke Beasley  ([id number], December 30, 1930) on 04/28/23 at 12:15 PM EST by a video-enabled telemedicine application and verified that I am speaking with the correct person using two identifiers.  Location: Patient: Virtual Visit  Location Patient: Home Provider: Virtual Visit Location Provider: Home Office   I discussed the limitations of evaluation and management by telemedicine and the availability of in person appointments. The patient expressed understanding and agreed to proceed.    History of Present Illness: Brooke Beasley is a 89 y.o. who identifies as a female who was assigned female at birth, and is being seen today for sinus congestion that she has been suffering from for the past week  She has been using saline spray for relief  Has been coughing up green mucous mainly in the morning   She has had pneumonia in the past and is concerned it may be turning into that  She feels the majority of her congestion is in her head  Her productive cough is mainly in the morning   Throughout the day she will have a mild cough  Denies fever or new SOB    Problems:  Patient Active Problem List   Diagnosis Date Noted   Mitral regurgitation 01/17/2023   Exertional dyspnea 12/10/2022   Drug reaction 10/06/2022   Idiopathic urticaria 08/13/2022   Polycythemia 08/13/2022   OSA (obstructive sleep apnea) 02/22/2022   Poison ivy dermatitis 12/10/2021   Atrial flutter (HCC)    Nocturnal hypoxemia 11/13/2021   DNR (do not resuscitate)/DNI(Do Not Intubate 09/07/2021   Discitis of lumbosacral region 07/23/2021   Persistent atrial fibrillation (HCC) 07/23/2021   Radicular low back pain 07/14/2021   Asthmatic bronchitis 06/24/2021   Complete rectal prolapse 05/26/2021   Bilateral hearing  loss 05/12/2021   Pruritic condition 03/31/2021   Iron  deficiency anemia 02/10/2021   COVID-19 virus infection 10/13/2020   Macular degeneration 12/22/2019   PAC (premature atrial contraction) 07/09/2019   Recurrent Clostridioides difficile diarrhea 07/11/2018   Fatigue 04/09/2016   Complete tear of left rotator cuff 02/13/2016   Status post total shoulder replacement 11/27/2015   Health maintenance examination 10/07/2015    Pseudogout    Primary osteoarthritis of right shoulder 11/21/2014   Carotid stenosis 10/18/2014   Advanced care planning/counseling discussion 09/26/2014   Diverticulosis of colon without hemorrhage 09/26/2014   Stage 3b chronic kidney disease (HCC) 08/02/2014   Restless legs 07/04/2014   Hyponatremia 07/04/2014   Chronic abdominal pain 01/07/2014   Primary osteoarthritis of both hands 11/21/2013   Insomnia 10/23/2013   Medicare annual wellness visit, subsequent 06/08/2013   GERD (gastroesophageal reflux disease) 04/02/2013   Anxiety associated with depression 12/26/2012   Prediabetes 03/20/2012   Valvular heart disease 09/13/2011   Hypertension 02/24/2007   Hypothyroidism 11/02/2006   POAG (primary open-angle glaucoma) 11/02/2006   Bleeding hemorrhoids 11/02/2006   Irritable bowel syndrome with constipation 11/02/2006   Osteoporosis 11/02/2006   Osteoarthritis 10/20/2006    Allergies:  Allergies  Allergen Reactions   Ace Inhibitors Cough   Amlodipine  Nausea Only and Other (See Comments)    Stomach pains--feel like my insides are on fire   Carvedilol  Other (See Comments)    fatigue   Chlorthalidone  Other (See Comments)    Severe hyponatremia   Montelukast  Other (See Comments)    Slurred speech; really drowsy   Other Other (See Comments)    No seeds or corn because of IBS   Risedronate Sodium Other (See Comments)    REACTION: joint pain   Simvastatin  Other (See Comments)    REACTION: the full 20 mg pill causes leg pain- can tol 10 mg   Prolia  [Denosumab ] Other (See Comments)    facial swelling, throat tightness, right greater than left foot swelling, right great toe numbness   Alendronate Sodium Palpitations   Influenza Vaccines Rash and Other (See Comments)    Led to mental breakdown in 1960s    Losartan  Other (See Comments)    Cr bumped   Paroxetine Hcl Other (See Comments)    Not effective - felt ill on this medicine   Silenor  [Doxepin  Hcl] Palpitations     Hallucinations and racing heart   Sulfonamide Derivatives Rash   Toprol  Xl [Metoprolol ] Diarrhea    Diarrhea and weakness   Medications:  Current Outpatient Medications:    apixaban  (ELIQUIS ) 5 MG TABS tablet, Take 1 tablet (5 mg total) by mouth 2 (two) times daily., Disp: 60 tablet, Rfl: 11   Ascorbic Acid  (VITAMIN C PO), Take by mouth., Disp: , Rfl:    bimatoprost (LUMIGAN) 0.01 % SOLN, Place 1 drop into both eyes at bedtime., Disp: , Rfl:    carvedilol  (COREG ) 12.5 MG tablet, Take 1 tablet (12.5 mg total) by mouth 2 (two) times daily with a meal., Disp: 180 tablet, Rfl: 1   cetirizine  (ZYRTEC ) 10 MG tablet, Take 1 tablet (10 mg total) by mouth daily., Disp: 90 tablet, Rfl: 3   cholecalciferol  (VITAMIN D ) 1000 units tablet, Take 1,000 Units by mouth daily., Disp: , Rfl:    clonazePAM  (KLONOPIN ) 0.5 MG tablet, TAKE 1 TABLET BY MOUTH AT BEDTIME, Disp: 30 tablet, Rfl: 3   colchicine  0.6 MG tablet, Take 1 tablet (0.6 mg total) by mouth daily., Disp: 90 tablet, Rfl: 1  DULoxetine  (CYMBALTA ) 20 MG capsule, Take 1 capsule (20 mg total) by mouth daily., Disp: 90 capsule, Rfl: 4   EPINEPHrine  (EPIPEN  2-PAK) 0.3 mg/0.3 mL IJ SOAJ injection, Inject 0.3 mg into the muscle as needed for anaphylaxis. XOLAIR  INJECTION PROTOCOL, Disp: 2 each, Rfl: 2   famotidine  (PEPCID ) 20 MG tablet, Take 1 tablet (20 mg total) by mouth 2 (two) times daily., Disp: 180 tablet, Rfl: 3   Ferrous Sulfate  (IRON ) 28 MG TABS, Take 1 tablet (28 mg total) by mouth every other day., Disp: , Rfl:    furosemide  (LASIX ) 20 MG tablet, Take 1 tablet (20 mg total) by mouth daily., Disp: 90 tablet, Rfl: 4   gabapentin  (NEURONTIN ) 300 MG capsule, TAKE 1 CAPSULE BY MOUTH ONCE DAILY AS NEEDED FOR  NERVE  PAIN  (ITCHING), Disp: 90 capsule, Rfl: 3   levothyroxine  (SYNTHROID ) 100 MCG tablet, Take 1 tablet (100 mcg total) by mouth daily before breakfast., Disp: 90 tablet, Rfl: 4   loperamide  (IMODIUM ) 2 MG capsule, Take 1 capsule (2 mg total) by  mouth 3 (three) times daily as needed for diarrhea or loose stools., Disp: 30 capsule, Rfl: 0   mometasone  (ELOCON ) 0.1 % ointment, Apply topically daily. Shower than ointment applied while still wet daily, Disp: 135 g, Rfl: 1   Multiple Vitamin (MULTIVITAMIN ADULT PO), Take by mouth daily., Disp: , Rfl:    polyethylene glycol (MIRALAX  / GLYCOLAX ) 17 g packet, Take 17 g by mouth daily., Disp: , Rfl:    Potassium 99 MG TABS, Take 1 tablet (99 mg total) by mouth every Monday, Wednesday, and Friday., Disp: 330 tablet, Rfl:    Simethicone  180 MG CAPS, Take 1 capsule by mouth daily as needed (indigestion)., Disp: , Rfl:    timolol (TIMOPTIC) 0.5 % ophthalmic solution, Place 1 drop into both eyes every morning., Disp: , Rfl:   Observations/Objective: Patient is well-developed, well-nourished in no acute distress.  Resting comfortably  at home.  Head is normocephalic, atraumatic.  No labored breathing.  Speech is clear and coherent with logical content.  Patient is alert and oriented at baseline.    Assessment and Plan:  1. Acute non-recurrent pansinusitis (Primary)  Take Mucinex  as needed (without decongestant) to help loosen phlegm   - amoxicillin -clavulanate (AUGMENTIN ) 875-125 MG tablet; Take 1 tablet by mouth 2 (two) times daily for 7 days.  Dispense: 14 tablet; Refill: 0 Take with food   Continue saline nasal spray for added relief      Follow Up Instructions: I discussed the assessment and treatment plan with the patient. The patient was provided an opportunity to ask questions and all were answered. The patient agreed with the plan and demonstrated an understanding of the instructions.  A copy of instructions were sent to the patient via MyChart unless otherwise noted below.     The patient was advised to call back or seek an in-person evaluation if the symptoms worsen or if the condition fails to improve as anticipated.    [name], FNP

## 2023-04-28 NOTE — Telephone Encounter (Signed)
 Plz call this afternoon for update on symptoms.  I could see her tomorrow if needed. Let me know.

## 2023-04-28 NOTE — Telephone Encounter (Signed)
 Per chart review patient has virtual set up for evaluation.

## 2023-04-28 NOTE — Telephone Encounter (Signed)
  Chief Complaint: URI Symptoms Symptoms: Head Congestion, Nasal Drainage (yellow/green) Frequency: Constant for 4 days Pertinent Negatives: Patient denies fever, sore throat, difficulty breathing Disposition: [] ED /[x] Urgent Care (no appt availability in office) / [] Appointment(In office/virtual)/ []  [facility]/ [] Home Care/ [] Refused Recommended Disposition /[] [facility]/ []  Follow-up with PCP Additional Notes: Patient called in stating she was in office last week on 12/27 and only had mild nasal drip, but now symptoms have worsened with head congestion. Patient was originally requesting for Dr. Rilla to call in antibiotics for her. After further assessment, patient states she is having head congestion and worries it may be advancing to pneumonia. Patient denies fever and sore throat. Patient states she is coughing up yellow/green phlegm in the mornings. Advised patient to be evaluated, but patient states she cannot make it in to any office. Scheduled patient a virtual UC visit for today. Daughter on the phone with patient confirmed she can assist with this. Advised patient to call back if she needs any further help from our office.     Copied from CRM [id number]. Topic: Clinical - Red Word Triage >> Apr 28, 2023  9:36 AM Macario HERO wrote: Red Word that prompted transfer to Nurse Triage: Patient daughter called patient had a running nose that is gone and now she is coughing up dark colored phlegm. Patient states she is having a hard time breathing and afraid it may be pneumonia. Reason for Disposition  [1] Using nasal washes and pain medicine > 24 hours AND [2] sinus pain (around cheekbone or eye) persists  Answer Assessment - Initial Assessment Questions 1. LOCATION: Where does it hurt?      No  2. ONSET: When did the sinus pain start?  (e.g., hours, days)      About 4 days 3. SEVERITY: How bad is the pain?   (Scale 1-10; mild, moderate or severe)   - MILD (1-3):  doesn't interfere with normal activities    - MODERATE (4-7): interferes with normal activities (e.g., work or school) or awakens from sleep   - SEVERE (8-10): excruciating pain and patient unable to do any normal activities        No pain, just severe congestion 4. RECURRENT SYMPTOM: Have you ever had sinus problems before? If Yes, ask: When was the last time? and What happened that time?      Yes - about a year ago 5. NASAL CONGESTION: Is the nose blocked? If Yes, ask: Can you open it or must you breathe through your mouth?     Yes 6. NASAL DISCHARGE: Do you have discharge from your nose? If so ask, What color?     Greenish - yellow 7. FEVER: Do you have a fever? If Yes, ask: What is it, how was it measured, and when did it start?      No  Protocols used: Sinus Pain or Congestion-A-AH

## 2023-04-29 NOTE — Telephone Encounter (Signed)
 Called and spoke to pt. She said she is feeling a little better. She does not think she needs an office visit. If she changes her mind, she will go to UC over the weekend.

## 2023-05-07 ENCOUNTER — Encounter (HOSPITAL_BASED_OUTPATIENT_CLINIC_OR_DEPARTMENT_OTHER): Payer: Self-pay | Admitting: Cardiovascular Disease

## 2023-05-21 ENCOUNTER — Other Ambulatory Visit: Payer: Self-pay | Admitting: Family Medicine

## 2023-05-21 DIAGNOSIS — F418 Other specified anxiety disorders: Secondary | ICD-10-CM

## 2023-05-23 ENCOUNTER — Ambulatory Visit (HOSPITAL_BASED_OUTPATIENT_CLINIC_OR_DEPARTMENT_OTHER): Payer: PPO | Admitting: Cardiovascular Disease

## 2023-05-23 VITALS — BP 144/80 | HR 59 | Ht 60.0 in | Wt 174.0 lb

## 2023-05-23 DIAGNOSIS — Z5181 Encounter for therapeutic drug level monitoring: Secondary | ICD-10-CM | POA: Diagnosis not present

## 2023-05-23 DIAGNOSIS — I150 Renovascular hypertension: Secondary | ICD-10-CM

## 2023-05-23 DIAGNOSIS — G4733 Obstructive sleep apnea (adult) (pediatric): Secondary | ICD-10-CM

## 2023-05-23 DIAGNOSIS — I4819 Other persistent atrial fibrillation: Secondary | ICD-10-CM | POA: Diagnosis not present

## 2023-05-23 DIAGNOSIS — I491 Atrial premature depolarization: Secondary | ICD-10-CM

## 2023-05-23 DIAGNOSIS — R0602 Shortness of breath: Secondary | ICD-10-CM

## 2023-05-23 DIAGNOSIS — I4892 Unspecified atrial flutter: Secondary | ICD-10-CM | POA: Diagnosis not present

## 2023-05-23 DIAGNOSIS — I10 Essential (primary) hypertension: Secondary | ICD-10-CM | POA: Diagnosis not present

## 2023-05-23 DIAGNOSIS — N1832 Chronic kidney disease, stage 3b: Secondary | ICD-10-CM | POA: Diagnosis not present

## 2023-05-23 MED ORDER — CARVEDILOL 6.25 MG PO TABS: 6.2500 mg | ORAL_TABLET | Freq: Two times a day (BID) | ORAL | 1 refills | Status: DC

## 2023-05-23 MED ORDER — SPIRONOLACTONE 25 MG PO TABS: ORAL_TABLET | ORAL | 3 refills | Status: DC

## 2023-05-23 NOTE — Telephone Encounter (Signed)
Addressed at clinic visit 05/23/23.  Alver Sorrow, NP

## 2023-05-23 NOTE — Telephone Encounter (Signed)
ERx

## 2023-05-23 NOTE — Telephone Encounter (Signed)
Refill request for clonazePAM (KLONOPIN) 0.5 MG tablet   LOV - 04/22/23 Next OV - 08/22/23 Last refill - 01/24/23 #30/3

## 2023-05-23 NOTE — Patient Instructions (Addendum)
Medication Instructions:  DECREASE YOUR CARVEDILOL TO 6.25 MG TWICE A DAY   INCREASE YOUR SPIRONOLACTONE TO 25 MG 1 AND  1/2 TABLETS DAILY   STOP POTASSIUM   Labwork: BMET/BNP IN 1 WEEK   Testing/Procedures: NONE  Follow-Up: 07/04/2023 4:15 PM WITH DR Asbury   Any Other Special Instructions Will Be Listed Below (If Applicable).     If you need a refill on your cardiac medications before your next appointment, please call your pharmacy.

## 2023-05-23 NOTE — Progress Notes (Signed)
Advanced Hypertension Clinic Follow Up:    Date:  06/08/2023   ID:  Brooke Beasley, DOB 07-Feb-1931, MRN [id number]  PCP:  Brooke Boyden, MD  Cardiologist:  None  Nephrologist:  Referring MD: Brooke Boyden, MD   CC: Hypertension  History of Present Illness:    Brooke Beasley is a 89 y.o. female with a hx of persistent atrial fibrillation/flutter status post cardioversion, OSA on CPAP, HFpEF, hypertension, hyperlipidemia, CKD 3, mild aortic stenosis, and anxiety here to establish care in the Advanced Hypertension Clinic.  She has a history of a history of atrial fibrillation.  Ventricular rates were slow so carvedilol was reduced to 3.125 mg twice daily.  Blood pressure was elevated when she saw Dr. Bethena Beasley on 06/2022 but had generally been controlled.  At her visit 09/2022 she noted that her blood pressures were very labile ranging from the 90s to the 200s.  It started to become more difficult to control after surgery for rectal prolapse.  She also noted exertional dyspnea.  Amlodipine was increased to twice daily.  She had an echo 11/2022 that revealed LVEF 65-70% with mild LVH and mildly reduced RV function.  She had mild posterior mitral valve leaflet prolapse and mild to moderate mitral regurgitation.  She had mild to moderate aortic stenosis as well.  Nuclear stress testing was low risk.  She saw Brooke Shields, NP 12/2022 and had self discontinued amlodipine due to itching.  Blood pressure at home was averaging 137/79.  There was concern hydralazine may be contributing to the itching so it was reduced and doxazosin was added.    At her visit 03/2023 her main complaint was severe pruritus.  She noted that it did improve after stopping amlodipine but then recurred.  Blood pressure at that visit was 162/94.  At home it was averaging in the 140s.  Doxazosin and spironolactone were both discontinued and carvedilol was increased.  Brooke Beasley reports an improvement in  itching symptoms, describing her symptoms as "off and on" but overall better. The itching was initially persistent, but has gradually improved over time. The patient has been off doxazosin, but has restarted spironolactone due to persistently high blood pressure readings.  Her blood pressure readings have been fluctuating, with some readings as low as 94 and others as high as 153. She reports feeling weak on days when the blood pressure is low. The patient also reports occasional lightheadedness and dizziness when the blood pressure drops, but these symptoms are not severe.  Brooke Beasley had a recent fall, hitting her head and knees, and experiencing pain in her toe. This incident may have contributed to higher blood pressure readings. Her heart rate has been low, likely due to the increased dose of carvedilol.  She has also been experiencing weight gain, but denies any significant changes in diet or appetite. She does not report any shortness of breath or visible swelling in the legs. Her activity level has decreased, which may contribute to the weight gain.  She has been taking a potassium supplement, but will discontinue this due to the addition of spironolactone, which can cause the body to retain potassium. She also takes a magnesium supplement, which may have a calming effect and help with sleep.  Her pruritus has been managed with topical treatments applied by a caregiver. She has difficulty reaching the affected areas due to limited mobility.      Previous antihypertensives: ACE-I Carvedilol Chlorthalidone Losartan metoprolol Atenolol Chlorthalidone Hydrochlorothiazide Irbesartan Imdur Hydrochlorothiazide-triamterene hydralazine  Past Medical History:  Diagnosis Date   Anxiety    Basal cell carcinoma, arm, left 02/2023   L mid lateral posterior arm (Brooke Beasley)   C. difficile diarrhea 07/11/2018   S/p multiple oral vanc treatments (course and tapers) and completed Zinplava  monoclonal Ab treatment (05/10/2019) Fecal transplant on hold during COVID pandemic   CAP (community acquired pneumonia) 12/22/2017   Carotid stenosis 10/18/2014   R 1-39%, L 40-59%, rpt 1 yr (09/2014)    CKD (chronic kidney disease) stage 3, GFR 30-59 ml/min (HCC) 08/02/2014   Closed fracture of right orbit (HCC) 02/10/2021   Clostridioides difficile infection    hx 2020   COVID-19 virus infection 10/13/2020   Degenerative disc disease    LS   Depression    nervous breakdonw in 1964-out of work for a year   Diverticulosis    severe by colonoscopy   Family history of adverse reaction to anesthesia    n/v   GERD (gastroesophageal reflux disease)    Glaucoma    Heart murmur 08/2011   mitral regurge - on echo    History of hiatal hernia    History of shingles    HTN (hypertension)    Hyperlipidemia    Hypertension    Hypothyroidism    IBS 11/02/2006   Influenza A 04/20/2022   Intestinal bacterial overgrowth    In small colon   Left shoulder pain 11/04/2014   Lower GI bleed 12/2020   thought diverticular complicated by ABLA with syncope and orbital fracture s/p hospitalization   Maxillary fracture (HCC) 04/08/2012   Osteoarthritis 2016   Brooke Beasley)   Osteoporosis    dexa 2011   Perirectal abscess 08/03/2021   CT guided aspiration of abscess grow Proteums mirabilis and Citrobacter koseri 07/2021, currently receiving IV Zosyn planned total 6 wks.   Pseudogout 2016   shoulders (Brooke Beasley)   Strep pharyngitis 04/20/2022    Past Surgical History:  Procedure Laterality Date   barium enema  2012   severe diverticulosis, redundant colon   Bowel obstruction  1999   no surgery in hosp x 3 days   BUBBLE STUDY  07/27/2021   Procedure: BUBBLE STUDY;  Surgeon: Brooke Cobb, MD;  Location: [facility] ENDOSCOPY;  Service: Cardiovascular;;   CARDIOVERSION N/A 11/19/2021   Procedure: CARDIOVERSION;  Surgeon: Brooke Odea, MD;  Location: [facility] ORS;  Service: Cardiovascular;  Laterality: N/A;    COLONOSCOPY  1. 1999  2. 11/04   1. Not finished  2. Slight hemorrhage rectosigmoid area, severe sig diverticulosis   COLONOSCOPY WITH PROPOFOL N/A 09/10/2019   SSP with dysplasia, TA, rpt 3 yrs Brooke Beasley, Brooke Dubonnet, MD)   Dexa  1. [phone]   2. 9/04  3. 3/08    1. OP  2. OP, borderline, spine -2.44T  3. decreased BMD-OP   DG KNEE 1-2 VIEWS BILAT     LS x-ray with degenerative disc and facet change   ESOPHAGOGASTRODUODENOSCOPY     Negative   FLEXIBLE SIGMOIDOSCOPY N/A 01/16/2021   Procedure: FLEXIBLE SIGMOIDOSCOPY;  Surgeon: Iva Boop, MD;  Location: [facility] ENDOSCOPY;  Service: Endoscopy;  Laterality: N/A;  or unsedated   Hemorrhoid procedure  07/2006   PICC LINE REMOVAL ([facility] HX)  08/28/2021   PROCTOSCOPY N/A 05/26/2021   Procedure: RIGID PROCTOSCOPY;  Surgeon: Karie Soda, MD;  Location: [facility] ORS;  Service: General;  Laterality: N/A;   RECTOPEXY N/A 05/26/2021   Procedure: RECTOPEXY;  Surgeon: Karie Soda, MD;  Location: [facility] ORS;  Service: General;  Laterality: N/A;   TEE WITHOUT CARDIOVERSION N/A 07/27/2021   Procedure: TRANSESOPHAGEAL ECHOCARDIOGRAM (TEE);  Surgeon: Brooke Cobb, MD;  Location: [facility] ENDOSCOPY;  Service: Cardiovascular;  Laterality: N/A;   TONSILLECTOMY     TOTAL SHOULDER ARTHROPLASTY Left 11/27/2015   Christena Flake, MD   TOTAL SHOULDER REVISION Left 03/16/2016   Procedure: TOTAL SHOULDER REVISION;  Surgeon: Christena Flake, MD;  Location: [facility] ORS;  Service: Orthopedics;  Laterality: Left;   US ECHOCARDIOGRAPHY  07/2011   Normal systolic fxn with EF 55-60%.  Focal basal septal hypertrophy.  Mild diastolic dysfunction.  Mild MR.   XI ROBOTIC ASSISTED LOWER ANTERIOR RESECTION N/A 05/26/2021   Procedure: ROBOTIC LOW ANTERIOR RECTOSIGMOID RESECTION; ASSESSMENT OF TISSUE PERFUSION WITH FIREFLY;  Surgeon: Karie Soda, MD;  Location: [facility] ORS;  Service: General;  Laterality: N/A;    Current Medications: Current Meds  Medication Sig   apixaban (ELIQUIS) 5 MG TABS tablet  Take 1 tablet (5 mg total) by mouth 2 (two) times daily.   Ascorbic Acid (VITAMIN C PO) Take by mouth.   bimatoprost (LUMIGAN) 0.01 % SOLN Place 1 drop into both eyes at bedtime.   cetirizine (ZYRTEC) 10 MG tablet Take 1 tablet (10 mg total) by mouth daily.   cholecalciferol (VITAMIN D) 1000 units tablet Take 1,000 Units by mouth daily.   colchicine 0.6 MG tablet Take 1 tablet (0.6 mg total) by mouth daily.   DULoxetine (CYMBALTA) 20 MG capsule Take 1 capsule (20 mg total) by mouth daily.   EPINEPHrine (EPIPEN 2-PAK) 0.3 mg/0.3 mL IJ SOAJ injection Inject 0.3 mg into the muscle as needed for anaphylaxis. Geoffry Paradise INJECTION PROTOCOL   famotidine (PEPCID) 20 MG tablet Take 1 tablet (20 mg total) by mouth 2 (two) times daily.   Ferrous Sulfate (IRON) 28 MG TABS Take 1 tablet (28 mg total) by mouth every other day.   furosemide (LASIX) 20 MG tablet Take 1 tablet (20 mg total) by mouth daily.   gabapentin (NEURONTIN) 300 MG capsule TAKE 1 CAPSULE BY MOUTH ONCE DAILY AS NEEDED FOR  NERVE  PAIN  (ITCHING)   levothyroxine (SYNTHROID) 100 MCG tablet Take 1 tablet (100 mcg total) by mouth daily before breakfast.   loperamide (IMODIUM) 2 MG capsule Take 1 capsule (2 mg total) by mouth 3 (three) times daily as needed for diarrhea or loose stools.   mometasone (ELOCON) 0.1 % ointment Apply topically daily. Shower than ointment applied while still wet daily   Multiple Vitamin (MULTIVITAMIN ADULT PO) Take by mouth daily.   polyethylene glycol (MIRALAX / GLYCOLAX) 17 g packet Take 17 g by mouth daily.   Simethicone 180 MG CAPS Take 1 capsule by mouth daily as needed (indigestion).   spironolactone (ALDACTONE) 25 MG tablet TAKE 1 AND 1/2 TABLETS DAILY   timolol (TIMOPTIC) 0.5 % ophthalmic solution Place 1 drop into both eyes every morning.   [DISCONTINUED] carvedilol (COREG) 12.5 MG tablet Take 1 tablet (12.5 mg total) by mouth 2 (two) times daily with a meal.   [DISCONTINUED] clonazePAM (KLONOPIN) 0.5 MG tablet  TAKE 1 TABLET BY MOUTH AT BEDTIME   [DISCONTINUED] Potassium 99 MG TABS Take 1 tablet (99 mg total) by mouth every Monday, Wednesday, and Friday.     Allergies:   Ace inhibitors, Amlodipine, Carvedilol, Chlorthalidone, Montelukast, Other, Risedronate sodium, Simvastatin, Prolia [denosumab], Alendronate sodium, Influenza vaccines, Losartan, Paroxetine hcl, Silenor [doxepin hcl], Sulfonamide derivatives, and Toprol xl [metoprolol]   Social History   Socioeconomic History   Marital status: Widowed  Spouse name: Not on file   Number of children: 2   Years of education: Not on file   Highest education level: 12th grade  Occupational History   Occupation: Retired    Associate Professor: RETIRED  Tobacco Use   Smoking status: Never   Smokeless tobacco: Never  Vaping Use   Vaping status: Never Used  Substance and Sexual Activity   Alcohol use: No   Drug use: No   Sexual activity: Not Currently  Other Topics Concern   Not on file  Social History Narrative   Left handed   Widow. Husband ([name]) deceased Apr 05, 2016 from dementia. She was caregiver.    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   Activity: no regular exercise   Diet: good water, fruits/vegetables daily      Patient Care Team:   Brooke Boyden, MD as PCP - General (Family Medicine)   Brooke Odea, MD as PCP - Cardiology (Cardiology)   Otho Ket, RN as [facility] Care Management   Gross, Viviann Spare, MD as Consulting Physician (General Surgery)   Toney Reil, MD as Consulting Physician (Gastroenterology)   Brooke Odea, MD as Consulting Physician (Cardiology)   Social Drivers of Health   Financial Resource Strain: Low Risk  (04/22/2023)   Overall Financial Resource Strain (CARDIA)    Difficulty of Paying Living Expenses: Not hard at all  Food Insecurity: No Food Insecurity (04/22/2023)   Hunger Vital Sign    Worried About  Running Out of Food in the Last Year: Never true    Ran Out of Food in the Last Year: Never true  Transportation Needs: No Transportation Needs (04/22/2023)   PRAPARE - Transportation    Lack of Transportation (Medical): No    Lack of Transportation (Non-Medical): No  Physical Activity: Unknown (04/22/2023)   Exercise Vital Sign    Days of Exercise per Week: Patient declined    Minutes of Exercise per Session: Not on file  Stress: Patient Declined (04/22/2023)   [employer] of Occupational Health - Occupational Stress Questionnaire    Feeling of Stress : Patient declined  Social Connections: Moderately Isolated (04/22/2023)   Social Connection and Isolation Panel [NHANES]    Frequency of Communication with Friends and Family: More than three times a week    Frequency of Social Gatherings with Friends and Family: Three times a week    Attends Religious Services: 1 to 4 times per year    Active Member of Clubs or Organizations: No    Attends Banker Meetings: Not on file    Marital Status: Widowed     Family History: The patient's family history includes Breast cancer in her cousin; Coronary artery disease in her brother; Diabetes in her brother and paternal grandfather.  ROS:   Please see the history of present illness.     All other systems reviewed and are negative.  EKGs/Labs/Other Studies Reviewed:    EKG:  EKG is not ordered today.    Recent Labs: 06/16/2022: Magnesium 1.9 08/06/2022: ALT 25 10/06/2022: TSH 4.81 03/15/2023: Hemoglobin 15.7; Platelets 202 06/01/2023: BNP 153.5; BUN 24; Creatinine, Ser 1.27; Potassium 4.8; Sodium 135   Recent Lipid Panel    Component Value Date/Time   CHOL 181 08/06/2022 0841   CHOL 179 03/27/2021 0935   TRIG 79.0 08/06/2022 0841   HDL 64.70 08/06/2022 0841   HDL 71 03/27/2021 0935   CHOLHDL  3 08/06/2022 0841   VLDL 15.8 08/06/2022 0841   LDLCALC 101 (H) 08/06/2022 0841   LDLCALC 95 03/27/2021 0935   LDLDIRECT 86.0  09/19/2014 1049    Physical Exam:   VS:  BP (!) 144/80   Pulse (!) 59   Ht 5' (1.524 m)   Wt 174 lb (78.9 kg)   BMI 33.98 kg/m  , BMI Body mass index is 33.98 kg/m. GENERAL:  Well appearing HEENT: Pupils equal round and reactive, fundi not visualized, oral mucosa unremarkable NECK:  No jugular venous distention, waveform within normal limits, carotid upstroke brisk and symmetric, no bruits, no thyromegaly LUNGS:  Clear to auscultation bilaterally HEART:  RRR.  PMI not displaced or sustained,S1 and S2 within normal limits, no S3, no S4, no clicks, no rubs, III/VI mid-peaking systolic murmur at the LUSB ABD:  Flat, positive bowel sounds normal in frequency in pitch, no bruits, no rebound, no guarding, no midline pulsatile mass, no hepatomegaly, no splenomegaly EXT:  2 plus pulses throughout, no edema, no cyanosis no clubbing SKIN:  No rashes no nodules NEURO:  Cranial nerves II through XII grossly intact, motor grossly intact throughout PSYCH:  Cognitively intact, oriented to person place and time  ASSESSMENT/PLAN:    # Hypertension Blood pressure readings remain variable with some high readings and symptomatic low blood pressures. Patient is currently on spironolactone and carvedilol. -Decrease carvedilol to 6.25mg  due to low heart rate. -Increase spironolactone to 37.5mg  daily to manage fluid retention. -Check labs including BNP in 1 week.  # Pruritus Improvement noted after discontinuation of doxazosin. -Continue current management.  # Weight Gain Patient reports unexplained weight gain. No signs of fluid overload on examination. -Check thyroid function tests and BNP with next labs. -Encourage increased physical activity as tolerated.  # Fall Risk Recent fall with minor injuries reported. -Encourage use of assistive devices as needed for stability.  # General Health Maintenance -Discontinue potassium supplement due to increased spironolactone dose. -Check potassium level  with next labs. -Follow-up in 1-2 months or sooner if needed.      Screening for Secondary Hypertension:     10/18/2022    3:11 PM  Causes  Drugs/Herbals Screened     - Comments limits added salt.  No caffeine or alcohol.  Renovascular HTN Screened     - Comments abdominal CT-A. Renal Doppler inconclusive 08/2021  Sleep Apnea Screened     - Comments uses CPAP  Thyroid Disease Screened  Hyperaldosteronism Screened     - Comments check renin and aldosterone  Pheochromocytoma Screened     - Comments check catecholamines and metanephrines  Cushing's Syndrome N/A  Hyperparathyroidism Screened  Coarctation of the Aorta Screened  Compliance Screened    Relevant Labs/Studies:    Latest Ref Rng & Units 06/01/2023   10:38 AM 04/22/2023    3:05 PM 03/15/2023    9:37 AM  Basic Labs  Sodium 134 - 144 mmol/L 135  138  138   Potassium 3.5 - 5.2 mmol/L 4.8  4.0  4.5   Creatinine 0.57 - 1.00 mg/dL 7.82  9.56  2.13        Latest Ref Rng & Units 10/06/2022   12:47 PM 06/16/2022   10:03 AM  Thyroid   TSH 0.35 - 5.50 uIU/mL 4.81  1.75        Latest Ref Rng & Units 10/19/2022    8:45 AM  Renin/Aldosterone   Aldosterone 0.0 - 30.0 ng/dL 08.6   Aldos/Renin Ratio 0.0 -  30.0 8.9        Latest Ref Rng & Units 10/19/2022    8:45 AM  Metanephrines/Catecholamines   Epinephrine 0 - 62 pg/mL 33   Norepinephrine 0 - 874 pg/mL 1,538   Dopamine 0 - 48 pg/mL <30   Metanephrines 0.0 - 88.0 pg/mL <25.0   Normetanephrines  0.0 - 297.2 pg/mL 132.4        Latest Ref Rng & Units 09/07/2021    4:31 AM  Cortisol  Cortisol  ug/dL 16.1        0/12/6043    9:03 AM  Renovascular   Renal Artery [place] Completed Yes     Disposition:    FU with MD/PharmD in 1 month    Medication Adjustments/Labs and Tests Ordered: Current medicines are reviewed at length with the patient today.  Concerns regarding medicines are outlined above.  Orders Placed This Encounter  Procedures   Basic metabolic panel   B Nat  Peptide   Meds ordered this encounter  Medications   carvedilol (COREG) 6.25 MG tablet    Sig: Take 1 tablet (6.25 mg total) by mouth 2 (two) times daily with a meal.    Dispense:  180 tablet    Refill:  1    DOSE CHANGE, D/C PREVIOUS RX PATIENT WILL CALL WHEN NEEDS FILLED   spironolactone (ALDACTONE) 25 MG tablet    Sig: TAKE 1 AND 1/2 TABLETS DAILY    Dispense:  135 tablet    Refill:  3    D/C POTASSIUM     Signed, Chilton Si, MD  06/08/2023 10:58 PM    [facility]

## 2023-05-30 ENCOUNTER — Encounter (HOSPITAL_BASED_OUTPATIENT_CLINIC_OR_DEPARTMENT_OTHER): Payer: Self-pay | Admitting: Cardiovascular Disease

## 2023-06-01 DIAGNOSIS — R0602 Shortness of breath: Secondary | ICD-10-CM | POA: Diagnosis not present

## 2023-06-01 DIAGNOSIS — Z5181 Encounter for therapeutic drug level monitoring: Secondary | ICD-10-CM | POA: Diagnosis not present

## 2023-06-01 DIAGNOSIS — I10 Essential (primary) hypertension: Secondary | ICD-10-CM | POA: Diagnosis not present

## 2023-06-02 ENCOUNTER — Encounter (HOSPITAL_BASED_OUTPATIENT_CLINIC_OR_DEPARTMENT_OTHER): Payer: Self-pay | Admitting: *Deleted

## 2023-06-02 DIAGNOSIS — L2989 Other pruritus: Secondary | ICD-10-CM | POA: Diagnosis not present

## 2023-06-02 DIAGNOSIS — D485 Neoplasm of uncertain behavior of skin: Secondary | ICD-10-CM | POA: Diagnosis not present

## 2023-06-02 LAB — BRAIN NATRIURETIC PEPTIDE: BNP: 153.5 pg/mL — ABNORMAL HIGH (ref 0.0–100.0)

## 2023-06-02 LAB — BASIC METABOLIC PANEL
BUN/Creatinine Ratio: 19 (ref 12–28)
BUN: 24 mg/dL (ref 10–36)
CO2: 22 mmol/L (ref 20–29)
Calcium: 9.9 mg/dL (ref 8.7–10.3)
Chloride: 95 mmol/L — ABNORMAL LOW (ref 96–106)
Creatinine, Ser: 1.27 mg/dL — ABNORMAL HIGH (ref 0.57–1.00)
Glucose: 86 mg/dL (ref 70–99)
Potassium: 4.8 mmol/L (ref 3.5–5.2)
Sodium: 135 mmol/L (ref 134–144)
eGFR: 40 mL/min/{1.73_m2} — ABNORMAL LOW (ref >59)

## 2023-06-08 ENCOUNTER — Encounter (HOSPITAL_BASED_OUTPATIENT_CLINIC_OR_DEPARTMENT_OTHER): Payer: Self-pay | Admitting: Cardiovascular Disease

## 2023-06-16 ENCOUNTER — Encounter (HOSPITAL_BASED_OUTPATIENT_CLINIC_OR_DEPARTMENT_OTHER): Payer: PPO | Admitting: Cardiovascular Disease

## 2023-06-16 ENCOUNTER — Telehealth (HOSPITAL_BASED_OUTPATIENT_CLINIC_OR_DEPARTMENT_OTHER): Payer: Self-pay | Admitting: *Deleted

## 2023-06-16 ENCOUNTER — Ambulatory Visit: Payer: PPO

## 2023-06-16 ENCOUNTER — Encounter (HOSPITAL_BASED_OUTPATIENT_CLINIC_OR_DEPARTMENT_OTHER): Payer: Self-pay

## 2023-06-16 NOTE — Telephone Encounter (Signed)
-----   Message from Alver Sorrow sent at 06/16/2023 11:28 AM EST ----- Kidney function stable. Potassium normal. BNP with only mild volume overload.   Her daughter sent in BP log with average 141/87 with less variability than previous which is good.  Recommend check in to see how she is feeling in regards to fluid status prior to making any changes.

## 2023-06-16 NOTE — Telephone Encounter (Signed)
  Spoke with patient regarding labs Does have times where she is more short of breath than others. Does take and extra Lasix about 1 x a week, this does help with the breathing. Will forward to Ronn Melena NP for review

## 2023-06-16 NOTE — Telephone Encounter (Signed)
   Burnell Blanks, LPN 5/62/1308  6:57 PM EST Back to Top    Advised patient, verbalized understanding.   Alver Sorrow, NP 06/16/2023  4:33 PM EST     Stools could be black related to iron if she is taking. Would recommend follow up with PCP.   Burnell Blanks, LPN 8/46/9629  5:28 PM EST     Reviewed recommendations with patient Over the last 3 weeks has been weaker than normal, no energy. Stools are black however she thinks she takes iron, daughter does her medications. Advised to talk with daughter to find out if taking iron or not Will forward to [place] for review   Alver Sorrow, NP 06/16/2023  2:24 PM EST     BP reasonably controlled. Extra Lasix once per week is acceptable. Recommend continuing current medications and if she notes progression in her shortness of breath to let us know. Follow up as scheduled 07/04/23.   Provider note only: given age, renal dysfunction, medication intolerances a BP goal <140/90 may be more reasonable goal

## 2023-06-17 ENCOUNTER — Encounter: Payer: Self-pay | Admitting: Family Medicine

## 2023-06-17 ENCOUNTER — Other Ambulatory Visit: Payer: PPO

## 2023-06-17 ENCOUNTER — Telehealth: Payer: Self-pay | Admitting: Family Medicine

## 2023-06-17 DIAGNOSIS — N1832 Chronic kidney disease, stage 3b: Secondary | ICD-10-CM | POA: Diagnosis not present

## 2023-06-17 DIAGNOSIS — R195 Other fecal abnormalities: Secondary | ICD-10-CM

## 2023-06-17 NOTE — Addendum Note (Signed)
Addended by: Eustaquio Boyden on: 06/17/2023 02:02 PM   Modules accepted: Orders

## 2023-06-17 NOTE — Telephone Encounter (Addendum)
Sodium levels have been normal as well as potassium.  Is she having ongoing dark stools? Will check blood counts - ordered. Would also offer iFOB stool test for blood - ordered.  Any other GI symptoms such as diarrhea, abd pain?

## 2023-06-17 NOTE — Telephone Encounter (Signed)
Received call back from patient and daughter. She has still had dark stools off and on. Denies any other GI symptoms at all. Will be by today to have labs and pick up stool card.

## 2023-06-17 NOTE — Addendum Note (Signed)
Addended by: Alvina Chou on: 06/17/2023 04:01 PM   Modules accepted: Orders

## 2023-06-17 NOTE — Telephone Encounter (Signed)
 Left message to return call to our office.

## 2023-06-17 NOTE — Telephone Encounter (Signed)
Copied from CRM [id number]. Topic: General - Other >> Jun 17, 2023  8:54 AM Fredrich Romans wrote: Reason for CRM: Patients daughter was advised by her cardiologist that patients sodium levels are low.Patient is scheduled for a visit with Dr Reece Agar on 06/28/23.Daughter would like to know if lab orders could be placed to make sure her sodium levels haven't gotten any lower,and any other labs that he thinks she may need before then.

## 2023-06-18 ENCOUNTER — Encounter: Payer: Self-pay | Admitting: Family Medicine

## 2023-06-18 ENCOUNTER — Other Ambulatory Visit: Payer: Self-pay | Admitting: Family Medicine

## 2023-06-18 DIAGNOSIS — F418 Other specified anxiety disorders: Secondary | ICD-10-CM

## 2023-06-18 LAB — RENAL FUNCTION PANEL
Albumin: 4.1 g/dL (ref 3.6–5.1)
BUN/Creatinine Ratio: 16 (calc) (ref 6–22)
BUN: 17 mg/dL (ref 7–25)
CO2: 31 mmol/L (ref 20–32)
Calcium: 9.8 mg/dL (ref 8.6–10.4)
Chloride: 97 mmol/L — ABNORMAL LOW (ref 98–110)
Creat: 1.05 mg/dL — ABNORMAL HIGH (ref 0.60–0.95)
Glucose, Bld: 99 mg/dL (ref 65–99)
Phosphorus: 3.6 mg/dL (ref 2.1–4.3)
Potassium: 4.4 mmol/L (ref 3.5–5.3)
Sodium: 136 mmol/L (ref 135–146)

## 2023-06-18 LAB — CBC WITH DIFFERENTIAL/PLATELET
Absolute Lymphocytes: 2574 {cells}/uL (ref 850–3900)
Absolute Monocytes: 813 {cells}/uL (ref 200–950)
Basophils Absolute: 20 {cells}/uL (ref 0–200)
Basophils Relative: 0.3 %
Eosinophils Absolute: 260 {cells}/uL (ref 15–500)
Eosinophils Relative: 4 %
HCT: 47.5 % — ABNORMAL HIGH (ref 35.0–45.0)
Hemoglobin: 15.9 g/dL — ABNORMAL HIGH (ref 11.7–15.5)
MCH: 32.3 pg (ref 27.0–33.0)
MCHC: 33.5 g/dL (ref 32.0–36.0)
MCV: 96.3 fL (ref 80.0–100.0)
MPV: 9.6 fL (ref 7.5–12.5)
Monocytes Relative: 12.5 %
Neutro Abs: 2834 {cells}/uL (ref 1500–7800)
Neutrophils Relative %: 43.6 %
Platelets: 221 10*3/uL (ref 140–400)
RBC: 4.93 10*6/uL (ref 3.80–5.10)
RDW: 13.1 % (ref 11.0–15.0)
Total Lymphocyte: 39.6 %
WBC: 6.5 10*3/uL (ref 3.8–10.8)

## 2023-06-20 ENCOUNTER — Other Ambulatory Visit (INDEPENDENT_AMBULATORY_CARE_PROVIDER_SITE_OTHER): Payer: Self-pay

## 2023-06-20 DIAGNOSIS — R195 Other fecal abnormalities: Secondary | ICD-10-CM

## 2023-06-20 NOTE — Telephone Encounter (Signed)
 Name of Medication:  Clonazepam Name of Pharmacy:  [facility] Last Fill or Written Date and Quantity:  05/23/23, #30 Last Office Visit and Type:  04/22/23, 4 mo f/u Next Office Visit and Type:  06/23/23, lab results f/u from Dr Duke Salvia Last Controlled Substance Agreement Date:  09/26/14 Last UDS:  09/26/14

## 2023-06-20 NOTE — Telephone Encounter (Signed)
 Update as requested

## 2023-06-21 ENCOUNTER — Encounter: Payer: Self-pay | Admitting: Family Medicine

## 2023-06-21 LAB — FECAL OCCULT BLOOD, IMMUNOCHEMICAL: Fecal Occult Bld: NEGATIVE

## 2023-06-21 NOTE — Telephone Encounter (Signed)
 ERx

## 2023-06-23 ENCOUNTER — Ambulatory Visit (INDEPENDENT_AMBULATORY_CARE_PROVIDER_SITE_OTHER): Payer: PPO | Admitting: Family Medicine

## 2023-06-23 ENCOUNTER — Encounter: Payer: Self-pay | Admitting: Family Medicine

## 2023-06-23 VITALS — BP 126/72 | HR 57 | Temp 97.7°F | Ht 60.0 in | Wt 175.0 lb

## 2023-06-23 DIAGNOSIS — R5381 Other malaise: Secondary | ICD-10-CM | POA: Insufficient documentation

## 2023-06-23 DIAGNOSIS — I150 Renovascular hypertension: Secondary | ICD-10-CM

## 2023-06-23 DIAGNOSIS — L299 Pruritus, unspecified: Secondary | ICD-10-CM | POA: Diagnosis not present

## 2023-06-23 DIAGNOSIS — F418 Other specified anxiety disorders: Secondary | ICD-10-CM

## 2023-06-23 DIAGNOSIS — R5382 Chronic fatigue, unspecified: Secondary | ICD-10-CM | POA: Diagnosis not present

## 2023-06-23 MED ORDER — MOMETASONE FUROATE 0.1 % EX OINT: TOPICAL_OINTMENT | Freq: Every day | CUTANEOUS | 1 refills | Status: DC

## 2023-06-23 MED ORDER — IRON 28 MG PO TABS: 1.0000 | ORAL_TABLET | ORAL | Status: DC

## 2023-06-23 NOTE — Assessment & Plan Note (Addendum)
 Overall stable period managed with elocon ointment, gabapentin 300mg  nightly, PRN OTC hydrocortisone ointment.  Refilled Elocon ointment per pt request.

## 2023-06-23 NOTE — Assessment & Plan Note (Signed)
 Continue cymbalta-  suggested taking in am.  Continue nightly klonopin.

## 2023-06-23 NOTE — Assessment & Plan Note (Deleted)
 Fluctuating, overall reassuring eval. Will further evaluate with labwork at upcoming CPE if ongoing at that time.

## 2023-06-23 NOTE — Assessment & Plan Note (Signed)
 Fluctuating, overall reassuring eval. Will further evaluate with labwork at upcoming CPE if ongoing at that time.

## 2023-06-23 NOTE — Assessment & Plan Note (Signed)
 Chronic, overall stable period on current regimen - continue. Appreciate cardiology care.

## 2023-06-23 NOTE — Patient Instructions (Addendum)
 Drop iron to once weekly.  I have refilled elocon ointment to use as needed.  Try duloxetine (cymbalta) in am  We will repeat labs in April.  Let me know if worsening fatigue prior  Good to see you today

## 2023-06-23 NOTE — Progress Notes (Signed)
 Ph: [phone] Fax: [phone]   Patient ID: Brooke Beasley, female    DOB: 1930/06/21, 89 y.o.   MRN: [id number]  This visit was conducted in person.  BP 126/72   Pulse (!) 57   Temp 97.7 F (36.5 C) (Oral)   Ht 5' (1.524 m)   Wt 175 lb (79.4 kg)   SpO2 95%   BMI 34.18 kg/m    CC: discuss abnormal results  Subjective:   HPI: Brooke Beasley is a 89 y.o. female presenting on 06/23/2023 for Results (Here for f/u from abnormal lab results. Also, pt requests Dr Reece Agar take over prescribing Elocon rx. Pt accompanied by daughter, [name]. )   Recently saw Dr Duke Salvia cardiology for difficult to control hypertension - doing better on current regimen of carvedilol 6.25mg  bid, lasix 20mg  daily, spironolactone 37.5mg  daily. Recent labs reassuring. She still has intermittent elevated readings treated with hydralazine.   She did endorse 3 wks of weakness, low energy, as well as noticing darker stools. She is on oral iron every other day.  Work up included Hgb 15.9 (H) and iFOB stool test negative for blood.   She notes ongoing fatigue, occ headaches.   Chronic pruritus persists, may have improved some after stopping doxazosin.   On mometasone 0.1% ointment (Elocon) for chronic pruritus - uses PRN, previously prescribed by dermatologist. Also using camphor oil with benefit.      Relevant past medical, surgical, family and social history reviewed and updated as indicated. Interim medical history since our last visit reviewed. Allergies and medications reviewed and updated. Outpatient Medications Prior to Visit  Medication Sig Dispense Refill   apixaban (ELIQUIS) 5 MG TABS tablet Take 1 tablet (5 mg total) by mouth 2 (two) times daily. 60 tablet 11   Ascorbic Acid (VITAMIN C PO) Take by mouth.     bimatoprost (LUMIGAN) 0.01 % SOLN Place 1 drop into both eyes at bedtime.     carvedilol (COREG) 6.25 MG tablet Take 1 tablet (6.25 mg total) by mouth 2 (two) times daily with  a meal. 180 tablet 1   cetirizine (ZYRTEC) 10 MG tablet Take 1 tablet (10 mg total) by mouth daily. 90 tablet 3   cholecalciferol (VITAMIN D) 1000 units tablet Take 1,000 Units by mouth daily.     clonazePAM (KLONOPIN) 0.5 MG tablet TAKE 1 TABLET BY MOUTH AT BEDTIME 30 tablet 0   colchicine 0.6 MG tablet Take 1 tablet (0.6 mg total) by mouth daily. 90 tablet 1   DULoxetine (CYMBALTA) 20 MG capsule Take 1 capsule (20 mg total) by mouth daily. 90 capsule 4   EPINEPHrine (EPIPEN 2-PAK) 0.3 mg/0.3 mL IJ SOAJ injection Inject 0.3 mg into the muscle as needed for anaphylaxis. Geoffry Paradise INJECTION PROTOCOL 2 each 2   famotidine (PEPCID) 20 MG tablet Take 1 tablet (20 mg total) by mouth 2 (two) times daily. 180 tablet 3   furosemide (LASIX) 20 MG tablet Take 1 tablet (20 mg total) by mouth daily. 90 tablet 4   gabapentin (NEURONTIN) 300 MG capsule TAKE 1 CAPSULE BY MOUTH ONCE DAILY AS NEEDED FOR  NERVE  PAIN  (ITCHING) 90 capsule 3   levothyroxine (SYNTHROID) 100 MCG tablet Take 1 tablet (100 mcg total) by mouth daily before breakfast. 90 tablet 4   loperamide (IMODIUM) 2 MG capsule Take 1 capsule (2 mg total) by mouth 3 (three) times daily as needed for diarrhea or loose stools. 30 capsule 0   Multiple Vitamin (MULTIVITAMIN ADULT PO)  Take by mouth daily.     polyethylene glycol (MIRALAX / GLYCOLAX) 17 g packet Take 17 g by mouth daily.     Simethicone 180 MG CAPS Take 1 capsule by mouth daily as needed (indigestion).     spironolactone (ALDACTONE) 25 MG tablet TAKE 1 AND 1/2 TABLETS DAILY 135 tablet 3   timolol (TIMOPTIC) 0.5 % ophthalmic solution Place 1 drop into both eyes every morning.     Ferrous Sulfate (IRON) 28 MG TABS Take 1 tablet (28 mg total) by mouth every other day.     mometasone (ELOCON) 0.1 % ointment Apply topically daily. Shower than ointment applied while still wet daily 135 g 1   No facility-administered medications prior to visit.     Per HPI unless specifically indicated in ROS  section below Review of Systems  Objective:  BP 126/72   Pulse (!) 57   Temp 97.7 F (36.5 C) (Oral)   Ht 5' (1.524 m)   Wt 175 lb (79.4 kg)   SpO2 95%   BMI 34.18 kg/m   Wt Readings from Last 3 Encounters:  06/23/23 175 lb (79.4 kg)  05/23/23 174 lb (78.9 kg)  04/22/23 171 lb (77.6 kg)      Physical Exam Vitals and nursing note reviewed.  Constitutional:      Appearance: Normal appearance. She is not ill-appearing.  HENT:     Mouth/Throat:     Mouth: Mucous membranes are moist.     Pharynx: Oropharynx is clear. No oropharyngeal exudate or posterior oropharyngeal erythema.  Eyes:     Extraocular Movements: Extraocular movements intact.     Pupils: Pupils are equal, round, and reactive to light.  Cardiovascular:     Rate and Rhythm: Normal rate and regular rhythm.     Pulses: Normal pulses.     Heart sounds: Murmur (3/6 systolic USB) heard.  Pulmonary:     Effort: Pulmonary effort is normal. No respiratory distress.     Breath sounds: Normal breath sounds. No wheezing, rhonchi or rales.  Musculoskeletal:     Right lower leg: No edema.     Left lower leg: No edema.  Skin:    General: Skin is warm and dry.     Findings: No rash.  Neurological:     Mental Status: She is alert.  Psychiatric:        Mood and Affect: Mood normal.        Behavior: Behavior normal.       Results for orders placed or performed in visit on 06/20/23  Fecal occult blood, imunochemical   Collection Time: 06/20/23  1:46 PM   Specimen: Stool  Result Value Ref Range   Fecal Occult Bld Negative Negative   *Note: Due to a large number of results and/or encounters for the requested time period, some results have not been displayed. A complete set of results can be found in Results Review.   Lab Results  Component Value Date   NA 136 06/17/2023   CL 97 (L) 06/17/2023   K 4.4 06/17/2023   CO2 31 06/17/2023   BUN 17 06/17/2023   CREATININE 1.05 (H) 06/17/2023   EGFR 40 (L) 06/01/2023    CALCIUM 9.8 06/17/2023   PHOS 3.6 06/17/2023   ALBUMIN 4.2 08/06/2022   GLUCOSE 99 06/17/2023    Lab Results  Component Value Date   WBC 6.5 06/17/2023   HGB 15.9 (H) 06/17/2023   HCT 47.5 (H) 06/17/2023   MCV 96.3 06/17/2023  PLT 221 06/17/2023    Lab Results  Component Value Date   TSH 4.81 10/06/2022  BNP - 153 (06/01/2023)  Assessment & Plan:   Problem List Items Addressed This Visit     Hypertension   Chronic, overall stable period on current regimen - continue. Appreciate cardiology care.       Anxiety associated with depression   Continue cymbalta-  suggested taking in am.  Continue nightly klonopin.       Chronic fatigue and malaise   Fluctuating, overall reassuring eval. Will further evaluate with labwork at upcoming CPE if ongoing at that time.       Pruritic condition - Primary   Overall stable period managed with elocon ointment, gabapentin 300mg  nightly, PRN OTC hydrocortisone ointment.  Refilled Elocon ointment per pt request.       Relevant Medications   mometasone (ELOCON) 0.1 % ointment     Meds ordered this encounter  Medications   mometasone (ELOCON) 0.1 % ointment    Sig: Apply topically daily. Shower than ointment applied while still wet daily    Dispense:  45 g    Refill:  1   Ferrous Sulfate (IRON) 28 MG TABS    Sig: Take 1 tablet (28 mg total) by mouth once a week.    No orders of the defined types were placed in this encounter.   Patient Instructions  Drop iron to once weekly.  I have refilled elocon ointment to use as needed.  Try duloxetine (cymbalta) in am  We will repeat labs in April.  Let me know if worsening fatigue prior  Good to see you today  Follow up plan: Return if symptoms worsen or fail to improve.  Eustaquio Boyden, MD

## 2023-06-27 ENCOUNTER — Encounter: Payer: Self-pay | Admitting: Internal Medicine

## 2023-06-27 ENCOUNTER — Ambulatory Visit: Payer: PPO | Admitting: Internal Medicine

## 2023-06-27 VITALS — BP 136/80 | HR 79 | Temp 98.0°F | Ht 60.0 in | Wt 174.4 lb

## 2023-06-27 DIAGNOSIS — G4733 Obstructive sleep apnea (adult) (pediatric): Secondary | ICD-10-CM | POA: Diagnosis not present

## 2023-06-27 NOTE — Patient Instructions (Signed)
 Continue BiPAP as prescribed  Recommending mask fitting referral in [facility]   Please send download to our office for further assessment  Avoid Allergens and Irritants Avoid secondhand smoke Avoid SICK contacts Recommend  Masking  when appropriate Recommend Keep up-to-date with vaccinations

## 2023-06-27 NOTE — Progress Notes (Unsigned)
 @Patient  ID: Brooke Beasley, female    DOB: 1930/11/14, 89 y.o.   MRN: [id number]  Synopsis 89 year old female seen for sleep consult November 13, 2021 for nocturnal hypoxemia and witnessed apneic events.  Found to have mild obstructive sleep apnea hospitalization May 2023 for L5-S1 discitis/epidural abscess (Proteus/Citrobacter) requiring prolonged antibiotics Medical history significant for C. difficile, A-fib, congestive heart failure, aortic valve stenosis and scoliosis   TEST/EVENTS :  3/31, follow-up MRI with contrast showed a 4 mm epidural phlegmon without drainable component. 3/31, underwent disc aspiration that grew Enterococcus.  Following CT scan showed decrease in the size of seroma. 4/2, started endocarditis coverage with Unasyn 4/3, TEE negative for endocarditis antibiotics adjusted by ID, PICC line placed by vascular access team 4/7, MRI lumbar and sacral spine were repeated because of persistent pain and elevated inflammatory markers.  8 showed continued discitis osteomyelitis and L5-S1 level.  It also showed 6 cm size collection posterior to the rectum likely an abscess. 4/10: CT-guided aspiration of pelvic fluid collection posterior to the rectum.   3/30-4/13>> hospitalization for L5-S1 discitis/abscess posterior to rectum-cultures Proteus/Citrobacter-discharged on IV Zosyn-stop date 5/22.  New onset A-fib-started on anticoagulation. 5/03>> outpatient MRI-resolution of L5-S1 epidural abscess, persistent L5-S1 discitis/osteomyelitis. 5/11>> due to persistent leukocytosis-intermittent fevers-PICC line removed on 5/5-transition to Augmentin by ID in the outpatient setting. 5/14>> admit for hyponatremia. 5/15>> no response to IVF-renal consult-to ICU for 3% NaCl.   5/17 >> transfer back to [facility]-off 3% NaCl since 5/16.   11/2021 Split study -during baseline, RDI was 15/hour-CPAP titrated to 11 cm Since she already has BiPAP, can be set at 11/8 cm   BiPAP shows excellent  compliance with daily average usage at 10 hours.  AHI 10.6.  Patient's pressures are at 16/8.  Patient thinks that her granddaughter may have been pushing buttons on her machine.    Chief complaint Follow-up assessment for OSA   HPI Follow-up assessment for OSA Patient with critical illness for prolonged hospitalization in May 2023 Hypoxia events were noted Started on Luna BiPAP at discharge Set up for split-night sleep study completed August 2023 Optimal control 11 cm of CPAP however, since she was already on Luna BiPAP her pressures were set to 11/8 Patient is having issues with the mask We will plan to refer her to [facility] for mask assessment   Says overall she has been doing okay.  Is frustrated that she has chronic pruritus/atopic dermatitis.  Is followed by allergy and is on Xolair injections. Does not seem to be working very well.   No exacerbation at this time No evidence of heart failure at this time No evidence or signs of infection at this time No respiratory distress No fevers, chills, nausea, vomiting, diarrhea No evidence of lower extremity edema No evidence hemoptysis   Allergies  Allergen Reactions   Ace Inhibitors Cough   Amlodipine Nausea Only and Other (See Comments)    Stomach pains--"feel like my insides are on fire"   Carvedilol Other (See Comments)    fatigue   Chlorthalidone Other (See Comments)    Severe hyponatremia   Montelukast Other (See Comments)    Slurred speech; really drowsy   Other Other (See Comments)    No seeds or corn because of IBS   Risedronate Sodium Other (See Comments)    REACTION: joint pain   Simvastatin Other (See Comments)    REACTION: the full 20 mg pill causes leg pain- can tol 10 mg   Prolia [Denosumab] Other (  See Comments)    facial swelling, throat tightness, right greater than left foot swelling, right great toe numbness   Alendronate Sodium Palpitations   Influenza Vaccines Rash and Other (See Comments)     Led to mental breakdown in 1960s    Losartan Other (See Comments)    Cr bumped   Paroxetine Hcl Other (See Comments)    Not effective - felt ill on this medicine   Silenor [Doxepin Hcl] Palpitations    Hallucinations and racing heart   Sulfonamide Derivatives Rash   Toprol Xl [Metoprolol] Diarrhea    Diarrhea and weakness    Immunization History  Administered Date(s) Administered   PFIZER(Purple Top)SARS-COV-2 Vaccination 08/10/2019, 08/31/2019   Pneumococcal Conjugate-13 06/08/2013   Pneumococcal Polysaccharide-23 04/26/2002   Td 04/26/1998, 10/22/2008   Zoster, Live 05/11/2010    Past Medical History:  Diagnosis Date   Anxiety    Basal cell carcinoma, arm, left 02/2023   L mid lateral posterior arm ([place])   C. difficile diarrhea 07/11/2018   S/p multiple oral vanc treatments (course and tapers) and completed Zinplava monoclonal Ab treatment (05/10/2019) Fecal transplant on hold during COVID pandemic   CAP (community acquired pneumonia) 12/22/2017   Carotid stenosis 10/18/2014   R 1-39%, L 40-59%, rpt 1 yr (09/2014)    CKD (chronic kidney disease) stage 3, GFR 30-59 ml/min (HCC) 08/02/2014   Closed fracture of right orbit (HCC) 02/10/2021   Clostridioides difficile infection    hx 2020   COVID-19 virus infection 10/13/2020   Degenerative disc disease    LS   Depression    nervous breakdonw in 1964-out of work for a year   Diverticulosis    severe by colonoscopy   Family history of adverse reaction to anesthesia    n/v   GERD (gastroesophageal reflux disease)    Glaucoma    Heart murmur 08/2011   mitral regurge - on echo    History of hiatal hernia    History of shingles    HTN (hypertension)    Hyperlipidemia    Hypertension    Hypothyroidism    IBS 11/02/2006   Influenza A 04/20/2022   Intestinal bacterial overgrowth    In small colon   Left shoulder pain 11/04/2014   Lower GI bleed 12/2020   thought diverticular complicated by ABLA with syncope and  orbital fracture s/p hospitalization   Maxillary fracture (HCC) 04/08/2012   Osteoarthritis 2016   ([facility])   Osteoporosis    dexa 2011   Perirectal abscess 08/03/2021   CT guided aspiration of abscess grow Proteums mirabilis and Citrobacter koseri 07/2021, currently receiving IV Zosyn planned total 6 wks.   Pseudogout 2016   shoulders ([place])   Strep pharyngitis 04/20/2022    Tobacco History: Social History   Tobacco Use  Smoking Status Never  Smokeless Tobacco Never   Counseling given: Not Answered   Outpatient Medications Prior to Visit  Medication Sig Dispense Refill   apixaban (ELIQUIS) 5 MG TABS tablet Take 1 tablet (5 mg total) by mouth 2 (two) times daily. 60 tablet 11   Ascorbic Acid (VITAMIN C PO) Take by mouth.     bimatoprost (LUMIGAN) 0.01 % SOLN Place 1 drop into both eyes at bedtime.     carvedilol (COREG) 6.25 MG tablet Take 1 tablet (6.25 mg total) by mouth 2 (two) times daily with a meal. 180 tablet 1   cetirizine (ZYRTEC) 10 MG tablet Take 1 tablet (10 mg total) by mouth daily. 90 tablet 3  cholecalciferol (VITAMIN D) 1000 units tablet Take 1,000 Units by mouth daily.     clonazePAM (KLONOPIN) 0.5 MG tablet TAKE 1 TABLET BY MOUTH AT BEDTIME 30 tablet 0   colchicine 0.6 MG tablet Take 1 tablet (0.6 mg total) by mouth daily. 90 tablet 1   DULoxetine (CYMBALTA) 20 MG capsule Take 1 capsule (20 mg total) by mouth daily. 90 capsule 4   EPINEPHrine (EPIPEN 2-PAK) 0.3 mg/0.3 mL IJ SOAJ injection Inject 0.3 mg into the muscle as needed for anaphylaxis. Geoffry Paradise INJECTION PROTOCOL 2 each 2   famotidine (PEPCID) 20 MG tablet Take 1 tablet (20 mg total) by mouth 2 (two) times daily. 180 tablet 3   Ferrous Sulfate (IRON) 28 MG TABS Take 1 tablet (28 mg total) by mouth once a week.     furosemide (LASIX) 20 MG tablet Take 1 tablet (20 mg total) by mouth daily. 90 tablet 4   gabapentin (NEURONTIN) 300 MG capsule TAKE 1 CAPSULE BY MOUTH ONCE DAILY AS NEEDED FOR  NERVE  PAIN   (ITCHING) 90 capsule 3   levothyroxine (SYNTHROID) 100 MCG tablet Take 1 tablet (100 mcg total) by mouth daily before breakfast. 90 tablet 4   loperamide (IMODIUM) 2 MG capsule Take 1 capsule (2 mg total) by mouth 3 (three) times daily as needed for diarrhea or loose stools. 30 capsule 0   mometasone (ELOCON) 0.1 % ointment Apply topically daily. Shower than ointment applied while still wet daily 45 g 1   Multiple Vitamin (MULTIVITAMIN ADULT PO) Take by mouth daily.     polyethylene glycol (MIRALAX / GLYCOLAX) 17 g packet Take 17 g by mouth daily.     Simethicone 180 MG CAPS Take 1 capsule by mouth daily as needed (indigestion).     spironolactone (ALDACTONE) 25 MG tablet TAKE 1 AND 1/2 TABLETS DAILY 135 tablet 3   timolol (TIMOPTIC) 0.5 % ophthalmic solution Place 1 drop into both eyes every morning.     No facility-administered medications prior to visit.     BP 136/80 (BP Location: Left Arm, Patient Position: Sitting, Cuff Size: Normal)   Pulse 79   Temp 98 F (36.7 C) (Temporal)   Ht 5' (1.524 m)   Wt 174 lb 6.4 oz (79.1 kg)   SpO2 98%   BMI 34.06 kg/m    Review of Systems: Gen:  Denies  fever, sweats, chills weight loss  HEENT: Denies blurred vision, double vision, ear pain, eye pain, hearing loss, nose bleeds, sore throat Cardiac:  No dizziness, chest pain or heaviness, chest tightness,edema, No JVD Resp:   No cough, -sputum production, -shortness of breath,-wheezing, -hemoptysis,  Other:  All other systems negative   Physical Examination:   General Appearance: No distress  EYES PERRLA, EOM intact.   NECK Supple, No JVD Pulmonary: normal breath sounds, No wheezing.  CardiovascularNormal S1,S2.  No m/r/g.   Abdomen: Benign, Soft, non-tender. Neurology UE/LE 5/5 strength, no focal deficits Ext pulses intact, cap refill intact ALL OTHER ROS ARE NEGATIVE           Latest Ref Rng & Units 09/14/2021    2:00 PM  PFT Results  FVC-Pre L 1.87   FVC-Predicted Pre %  98   Pre FEV1/FVC % % 72   FEV1-Pre L 1.34   FEV1-Predicted Pre % 97        Assessment & Plan:   89 year old female seen today for follow-up assessment for obstructive sleep apnea    Assessment of Sleep apnea Discussed  in detail with patient We will need to obtain download report, granddaughter to fax [place] copy   Patient Instructions Continue to use CPAP every night, minimum of 4-6 hours a night.  Change equipment every 30 days or as directed by DME.  Wash your tubing with warm soap and water daily, hang to dry. Wash humidifier portion weekly. Use bottled, distilled water and change daily   Be aware of reduced alertness and do not drive or operate heavy machinery if experiencing this or drowsiness.  Exercise encouraged, as tolerated. Encouraged proper weight management.  Important to get eight or more hours of sleep  Limiting the use of the computer and television before bedtime.  Decrease naps during the day, so night time sleep will become enhanced.  Limit caffeine, and sleep deprivation.  HTN, stroke, uncontrolled diabetes and heart failure are potential risk factors.  Risk of untreated sleep apnea including cardiac arrhthymias, stroke, DM, pulm HTN.   Chronic Hypoxic resp failure due to COPD -Patient benefits from oxygen therapy 2L [place]  -recommend using oxygen as prescribed -patient needs this for survival    MEDICATION ADJUSTMENTS/LABS AND TESTS ORDERED: Continue BiPAP as prescribed Recommend mask fitting  referral Avoid Allergens and Irritants Avoid secondhand smoke Avoid SICK contacts Recommend  Masking  when appropriate Recommend Keep up-to-date with vaccinations   CURRENT MEDICATIONS REVIEWED AT LENGTH WITH PATIENT TODAY   Patient  satisfied with Plan of action and management. All questions answered   Follow up 6 weeks  I spent a total of 41  minutes reviewing chart data, face-to-face evaluation with the patient, counseling and coordination of care as  detailed above.      Lucie Leather, M.D.  [facility]  Medical Director [facility] Medical Director [facility] Cardio-Pulmonary Department

## 2023-06-28 ENCOUNTER — Other Ambulatory Visit: Payer: Self-pay | Admitting: Allergy and Immunology

## 2023-06-28 ENCOUNTER — Ambulatory Visit: Payer: PPO | Admitting: Family Medicine

## 2023-06-28 ENCOUNTER — Other Ambulatory Visit: Payer: Self-pay | Admitting: Internal Medicine

## 2023-06-28 DIAGNOSIS — G4733 Obstructive sleep apnea (adult) (pediatric): Secondary | ICD-10-CM

## 2023-06-28 NOTE — Progress Notes (Signed)
 Split night sleep study ordered.

## 2023-06-28 NOTE — Telephone Encounter (Signed)
 Per Dr. Belia Heman, he has place the order for the spilt night sleep study.  Nothing further needed.

## 2023-07-01 ENCOUNTER — Encounter (HOSPITAL_BASED_OUTPATIENT_CLINIC_OR_DEPARTMENT_OTHER): Payer: Self-pay

## 2023-07-04 ENCOUNTER — Ambulatory Visit (HOSPITAL_BASED_OUTPATIENT_CLINIC_OR_DEPARTMENT_OTHER): Payer: PPO | Admitting: Cardiovascular Disease

## 2023-07-04 ENCOUNTER — Encounter (HOSPITAL_BASED_OUTPATIENT_CLINIC_OR_DEPARTMENT_OTHER): Payer: Self-pay | Admitting: Cardiovascular Disease

## 2023-07-04 ENCOUNTER — Other Ambulatory Visit (HOSPITAL_BASED_OUTPATIENT_CLINIC_OR_DEPARTMENT_OTHER)

## 2023-07-04 VITALS — BP 148/84 | HR 58 | Ht 60.0 in | Wt 174.8 lb

## 2023-07-04 DIAGNOSIS — I480 Paroxysmal atrial fibrillation: Secondary | ICD-10-CM

## 2023-07-04 DIAGNOSIS — I10 Essential (primary) hypertension: Secondary | ICD-10-CM | POA: Diagnosis not present

## 2023-07-04 MED ORDER — HYDRALAZINE HCL 10 MG PO TABS: 10.0000 mg | ORAL_TABLET | Freq: Two times a day (BID) | ORAL | 5 refills | Status: DC

## 2023-07-04 NOTE — Patient Instructions (Addendum)
 Medication Instructions:  START HYDRALAZINE 10 MG TWICE A DAY   Labwork: NONE  Testing/Procedures: 7 DAY ZION   Follow-Up: 2 MONTHS WITH CAITLIN W NP OR DR [place]   Any Other Special Instructions Will Be Listed Below (If Applicable).  ZIO XT- Long Term Monitor Instructions  Your physician has requested you wear a ZIO patch monitor for 7 days.  This is a single patch monitor. [employer] supplies one patch monitor per enrollment. Additional stickers are not available. Please do not apply patch if you will be having a Nuclear Stress Test,  Echocardiogram, Cardiac CT, MRI, or Chest Xray during the period you would be wearing the  monitor. The patch cannot be worn during these tests. You cannot remove and re-apply the  ZIO XT patch monitor.  Your ZIO patch monitor will be mailed 3 day USPS to your address on file. It may take 3-5 days  to receive your monitor after you have been enrolled.  Once you have received your monitor, please review the enclosed instructions. Your monitor  has already been registered assigning a specific monitor serial # to you.  Billing and Patient Assistance Program Information  We have supplied [employer] with any of your insurance information on file for billing purposes. [employer] offers a sliding scale Patient Assistance Program for patients that do not have  insurance, or whose insurance does not completely cover the cost of the ZIO monitor.  You must apply for the Patient Assistance Program to qualify for this discounted rate.  To apply, please call [employer] at [phone], select option 4, select option 2, ask to apply for  Patient Assistance Program. Meredeth Ide will ask your household income, and how many people  are in your household. They will quote your out-of-pocket cost based on that information.  [employer] will also be able to set up a 26-month, interest-free payment plan if needed.  Applying the monitor   Shave hair from upper left chest.  Hold  abrader disc by orange tab. Rub abrader in 40 strokes over the upper left chest as  indicated in your monitor instructions.  Clean area with 4 enclosed alcohol pads. Let dry.  Apply patch as indicated in monitor instructions. Patch will be placed under collarbone on left  side of chest with arrow pointing upward.  Rub patch adhesive wings for 2 minutes. Remove white label marked "1". Remove the white  label marked "2". Rub patch adhesive wings for 2 additional minutes.  While looking in a mirror, press and release button in center of patch. A small green light will  flash 3-4 times. This will be your only indicator that the monitor has been turned on.  Do not shower for the first 24 hours. You may shower after the first 24 hours.  Press the button if you feel a symptom. You will hear a small click. Record Date, Time and  Symptom in the Patient Logbook.  When you are ready to remove the patch, follow instructions on the last 2 pages of Patient  Logbook. Stick patch monitor onto the last page of Patient Logbook.  Place Patient Logbook in the blue and white box. Use locking tab on box and tape box closed  securely. The blue and white box has prepaid postage on it. Please place it in the mailbox as  soon as possible. Your physician should have your test results approximately 7 days after the  monitor has been mailed back to [facility].  Call [facility] Customer Care at [phone] if  you have questions regarding  your ZIO XT patch monitor. Call them immediately if you see an orange light blinking on your  monitor.  If your monitor falls off in less than 4 days, contact our Monitor department at [phone].  If your monitor becomes loose or falls off after 4 days call [employer] at [phone] for  suggestions on securing your monitor

## 2023-07-04 NOTE — Progress Notes (Signed)
 Advanced Hypertension Clinic Follow Up:    Date:  07/18/2023   ID:  Brooke Beasley, DOB [birth year], MRN [id number]  PCP:  Brooke Boyden, MD  Cardiologist:  None  Nephrologist:  Referring MD: Brooke Boyden, MD   CC: Hypertension  History of Present Illness:    Brooke Beasley is a 89 y.o. female with a hx of persistent atrial fibrillation/flutter status post cardioversion, OSA on BiPAP, HFpEF, hypertension, hyperlipidemia, CKD 3, mild aortic stenosis, and anxiety here to establish care in the [facility] Hypertension Clinic.  She has a history of a history of atrial fibrillation.  Ventricular rates were slow so carvedilol was reduced to 3.125 mg twice daily.  Blood pressure was elevated when she saw Dr. Bethena Beasley on 06/2022 but had generally been controlled.  At her visit 09/2022 she noted that her blood pressures were very labile ranging from the 90s to the 200s.  It started to become more difficult to control after surgery for rectal prolapse.  She also noted exertional dyspnea.  Amlodipine was increased to twice daily.  She had an echo 11/2022 that revealed LVEF 65-70% with mild LVH and mildly reduced RV function.  She had mild posterior mitral valve leaflet prolapse and mild to moderate mitral regurgitation.  She had mild to moderate aortic stenosis as well.  Nuclear stress testing was low risk.  She saw Brooke Shields, NP 12/2022 and had self discontinued amlodipine due to itching.  Blood pressure at home was averaging 137/79.  There was concern hydralazine may be contributing to the itching so it was reduced and doxazosin was added.    At her visit 03/2023 her main complaint was severe pruritus.  She noted that it did improve after stopping amlodipine but then recurred.  Blood pressure at that visit was 162/94.  At home it was averaging in the 140s.  Doxazosin and spironolactone were both discontinued and carvedilol was increased.  At her visit 04/2023 her blood pressures  were ranging from the 90s to the 150s.  She felt weak when her blood pressure well.  She also had a fall.  In the office blood pressure was 144/80.  Carvedilol was reduced to 6.25 mg due to low heart rates.  Spironolactone was increased.  She brings a log.  She noted her blood pressure has been averaging 144/96.  Average heart rate is in the 50s.  Brooke Beasley experiences episodes of atrial fibrillation, which she describes as 'AFib days.' On these days, she feels out of breath and dizzy, with symptoms worsening today compared to a good day yesterday. She notes that she often feels worse on certain days, with about half of her days being 'bad days.'  She reports experiencing approximately three episodes of high blood pressure recently. During these episodes, she was administered hydralazine, which successfully lowered her blood pressure. Her blood pressure was recorded at 167/90 on one occasion. She has been using hydralazine 25 mg as needed to manage these episodes.  She has been experiencing persistent itching, which initially improved after discontinuing doxazosin but has not resolved completely. She has been using a steroid cream for relief when the itching becomes severe. She was previously on Zyrtec for itching but stopped it due to nasal symptoms. Despite medication adjustments, the itching persists, and she has not visited the allergy doctor recently.  She reports nasal bleeding characterized by streaks of blood when blowing her nose, which has been ongoing for more than a couple of weeks. The bleeding is not profuse  but is noticeable on tissues.  She mentions a stable weight over the past two months, around 174 pounds, despite feeling like she is gaining weight. She attributes this to decreased energy and reduced physical activity. She has been eating less due to a lack of appetite.  She uses a CPAP machine at night and reports talking in her sleep, which is audible to others. She is scheduled  for a new sleep study and mask fitting.      Previous antihypertensives: ACE-I Carvedilol Chlorthalidone Losartan metoprolol Atenolol Chlorthalidone Hydrochlorothiazide Irbesartan Imdur Hydrochlorothiazide-triamterene Hydralazine Doxazosin-patient  Past Medical History:  Diagnosis Date   Anxiety    Basal cell carcinoma, arm, left 02/2023   L mid lateral posterior arm ([place])   C. difficile diarrhea 07/11/2018   S/p multiple oral vanc treatments (course and tapers) and completed Zinplava monoclonal Ab treatment (05/10/2019) Fecal transplant on hold during COVID pandemic   CAP (community acquired pneumonia) 12/22/2017   Carotid stenosis 10/18/2014   R 1-39%, L 40-59%, rpt 1 yr (09/2014)    CKD (chronic kidney disease) stage 3, GFR 30-59 ml/min (HCC) 08/02/2014   Closed fracture of right orbit (HCC) 02/10/2021   Clostridioides difficile infection    hx 2020   COVID-19 virus infection 10/13/2020   Degenerative disc disease    LS   Depression    nervous breakdonw in 1964-out of work for a year   Diverticulosis    severe by colonoscopy   Family history of adverse reaction to anesthesia    n/v   GERD (gastroesophageal reflux disease)    Glaucoma    Heart murmur 08/2011   mitral regurge - on echo    History of hiatal hernia    History of shingles    HTN (hypertension)    Hyperlipidemia    Hypertension    Hypothyroidism    IBS 11/02/2006   Influenza A 04/20/2022   Intestinal bacterial overgrowth    In small colon   Left shoulder pain 11/04/2014   Lower GI bleed 12/2020   thought diverticular complicated by ABLA with syncope and orbital fracture s/p hospitalization   Maxillary fracture (HCC) 04/08/2012   Osteoarthritis 2016   Brooke Beasley)   Osteoporosis    dexa 2011   Perirectal abscess 08/03/2021   CT guided aspiration of abscess grow Proteums mirabilis and Citrobacter koseri 07/2021, currently receiving IV Zosyn planned total 6 wks.   Pseudogout 2016   shoulders  (Brooke Beasley)   Strep pharyngitis 04/20/2022    Past Surgical History:  Procedure Laterality Date   barium enema  2012   severe diverticulosis, redundant colon   Bowel obstruction  1999   no surgery in hosp x 3 days   BUBBLE STUDY  07/27/2021   Procedure: BUBBLE STUDY;  Surgeon: Orpah Cobb, MD;  Location: [facility] ENDOSCOPY;  Service: Cardiovascular;;   CARDIOVERSION N/A 11/19/2021   Procedure: CARDIOVERSION;  Surgeon: Debbe Odea, MD;  Location: [facility] ORS;  Service: Cardiovascular;  Laterality: N/A;   COLONOSCOPY  1. 1999  2. 11/04   1. Not finished  2. Slight hemorrhage rectosigmoid area, severe sig diverticulosis   COLONOSCOPY WITH PROPOFOL N/A 09/10/2019   SSP with dysplasia, TA, rpt 3 yrs Allegra Lai, Loel Dubonnet, MD)   Dexa  1. [phone]   2. 9/04  3. 3/08    1. OP  2. OP, borderline, spine -2.44T  3. decreased BMD-OP   DG KNEE 1-2 VIEWS BILAT     LS x-ray with degenerative disc and facet change  ESOPHAGOGASTRODUODENOSCOPY     Negative   FLEXIBLE SIGMOIDOSCOPY N/A 01/16/2021   Procedure: FLEXIBLE SIGMOIDOSCOPY;  Surgeon: Iva Boop, MD;  Location: [facility] ENDOSCOPY;  Service: Endoscopy;  Laterality: N/A;  or unsedated   Hemorrhoid procedure  07/2006   PICC LINE REMOVAL ([facility] HX)  08/28/2021   PROCTOSCOPY N/A 05/26/2021   Procedure: RIGID PROCTOSCOPY;  Surgeon: Karie Soda, MD;  Location: [facility] ORS;  Service: General;  Laterality: N/A;   RECTOPEXY N/A 05/26/2021   Procedure: RECTOPEXY;  Surgeon: Karie Soda, MD;  Location: [facility] ORS;  Service: General;  Laterality: N/A;   TEE WITHOUT CARDIOVERSION N/A 07/27/2021   Procedure: TRANSESOPHAGEAL ECHOCARDIOGRAM (TEE);  Surgeon: Orpah Cobb, MD;  Location: [facility] ENDOSCOPY;  Service: Cardiovascular;  Laterality: N/A;   TONSILLECTOMY     TOTAL SHOULDER ARTHROPLASTY Left 11/27/2015   Christena Flake, MD   TOTAL SHOULDER REVISION Left 03/16/2016   Procedure: TOTAL SHOULDER REVISION;  Surgeon: Christena Flake, MD;  Location: [facility] ORS;  Service:  Orthopedics;  Laterality: Left;   US ECHOCARDIOGRAPHY  07/2011   Normal systolic fxn with EF 55-60%.  Focal basal septal hypertrophy.  Mild diastolic dysfunction.  Mild MR.   XI ROBOTIC ASSISTED LOWER ANTERIOR RESECTION N/A 05/26/2021   Procedure: ROBOTIC LOW ANTERIOR RECTOSIGMOID RESECTION; ASSESSMENT OF TISSUE PERFUSION WITH FIREFLY;  Surgeon: Karie Soda, MD;  Location: [facility] ORS;  Service: General;  Laterality: N/A;    Current Medications: Current Meds  Medication Sig   apixaban (ELIQUIS) 5 MG TABS tablet Take 1 tablet (5 mg total) by mouth 2 (two) times daily.   Ascorbic Acid (VITAMIN C PO) Take by mouth.   bimatoprost (LUMIGAN) 0.01 % SOLN Place 1 drop into both eyes at bedtime.   carvedilol (COREG) 6.25 MG tablet Take 1 tablet (6.25 mg total) by mouth 2 (two) times daily with a meal.   cetirizine (EQ ALLERGY RELIEF, CETIRIZINE,) 10 MG tablet Take 1 tablet (10 mg total) by mouth daily.   cholecalciferol (VITAMIN D) 1000 units tablet Take 1,000 Units by mouth daily.   clonazePAM (KLONOPIN) 0.5 MG tablet TAKE 1 TABLET BY MOUTH AT BEDTIME   colchicine 0.6 MG tablet Take 1 tablet (0.6 mg total) by mouth daily.   DULoxetine (CYMBALTA) 20 MG capsule Take 1 capsule (20 mg total) by mouth daily.   EPINEPHrine (EPIPEN 2-PAK) 0.3 mg/0.3 mL IJ SOAJ injection Inject 0.3 mg into the muscle as needed for anaphylaxis. Geoffry Paradise INJECTION PROTOCOL   famotidine (PEPCID) 20 MG tablet Take 1 tablet (20 mg total) by mouth 2 (two) times daily.   Ferrous Sulfate (IRON) 28 MG TABS Take 1 tablet (28 mg total) by mouth once a week.   furosemide (LASIX) 20 MG tablet Take 1 tablet (20 mg total) by mouth daily.   gabapentin (NEURONTIN) 300 MG capsule TAKE 1 CAPSULE BY MOUTH ONCE DAILY AS NEEDED FOR  NERVE  PAIN  (ITCHING)   hydrALAZINE (APRESOLINE) 10 MG tablet Take 1 tablet (10 mg total) by mouth in the morning and at bedtime.   levothyroxine (SYNTHROID) 100 MCG tablet Take 1 tablet (100 mcg total) by mouth daily  before breakfast.   loperamide (IMODIUM) 2 MG capsule Take 1 capsule (2 mg total) by mouth 3 (three) times daily as needed for diarrhea or loose stools.   MAGNESIUM GLUCONATE PO Take by mouth at bedtime.   mometasone (ELOCON) 0.1 % ointment Apply topically daily. Shower than ointment applied while still wet daily   Multiple Vitamin (MULTIVITAMIN ADULT PO) Take by  mouth daily.   polyethylene glycol (MIRALAX / GLYCOLAX) 17 g packet Take 17 g by mouth daily.   Simethicone 180 MG CAPS Take 1 capsule by mouth daily as needed (indigestion).   spironolactone (ALDACTONE) 25 MG tablet TAKE 1 AND 1/2 TABLETS DAILY   timolol (TIMOPTIC) 0.5 % ophthalmic solution Place 1 drop into both eyes every morning.     Allergies:   Ace inhibitors, Amlodipine, Carvedilol, Chlorthalidone, Montelukast, Other, Risedronate sodium, Simvastatin, Prolia [denosumab], Alendronate sodium, Influenza vaccines, Losartan, Paroxetine hcl, Silenor [doxepin hcl], Sulfonamide derivatives, and Toprol xl [metoprolol]   Social History   Socioeconomic History   Marital status: Widowed    Spouse name: Not on file   Number of children: 2   Years of education: Not on file   Highest education level: 12th grade  Occupational History   Occupation: Retired    Associate Professor: RETIRED  Tobacco Use   Smoking status: Never   Smokeless tobacco: Never  Vaping Use   Vaping status: Never Used  Substance and Sexual Activity   Alcohol use: No   Drug use: No   Sexual activity: Not Currently  Other Topics Concern   Not on file  Social History Narrative   Left handed   Widow. Husband (Brooke Beasley) deceased 04-24-2016 from dementia. She was caregiver.    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   Activity: no regular exercise   Diet: good water, fruits/vegetables daily      Patient Care Team:   Brooke Boyden, MD as PCP - General (Family Medicine)   Debbe Odea, MD as PCP -  Cardiology (Cardiology)   Otho Ket, RN as [facility] Care Management   Gross, Viviann Spare, MD as Consulting Physician (General Surgery)   Toney Reil, MD as Consulting Physician (Gastroenterology)   Debbe Odea, MD as Consulting Physician (Cardiology)   Social Drivers of Health   Financial Resource Strain: Low Risk  (04/22/2023)   Overall Financial Resource Strain (CARDIA)    Difficulty of Paying Living Expenses: Not hard at all  Food Insecurity: No Food Insecurity (04/22/2023)   Hunger Vital Sign    Worried About Running Out of Food in the Last Year: Never true    Ran Out of Food in the Last Year: Never true  Transportation Needs: No Transportation Needs (04/22/2023)   PRAPARE - Transportation    Lack of Transportation (Medical): No    Lack of Transportation (Non-Medical): No  Physical Activity: Unknown (04/22/2023)   Exercise Vital Sign    Days of Exercise per Week: Patient declined    Minutes of Exercise per Session: Not on file  Stress: Patient Declined (04/22/2023)   [employer] of Occupational Health - Occupational Stress Questionnaire    Feeling of Stress : Patient declined  Social Connections: Moderately Isolated (04/22/2023)   Social Connection and Isolation Panel [NHANES]    Frequency of Communication with Friends and Family: More than three times a week    Frequency of Social Gatherings with Friends and Family: Three times a week    Attends Religious Services: 1 to 4 times per year    Active Member of Clubs or Organizations: No    Attends Banker Meetings: Not on file    Marital Status: Widowed     Family History: The patient's family history includes Breast cancer in her cousin; Coronary artery disease in her brother; Diabetes in her brother and  paternal grandfather.  ROS:   Please see the history of present illness.     All other systems reviewed and are negative.  EKGs/Labs/Other Studies Reviewed:    EKG:   EKG is not ordered today.    Recent Labs: 08/06/2022: ALT 25 10/06/2022: TSH 4.81 06/01/2023: BNP 153.5 06/17/2023: BUN 17; Creat 1.05; Hemoglobin 15.9; Platelets 221; Potassium 4.4; Sodium 136   Recent Lipid Panel    Component Value Date/Time   CHOL 181 08/06/2022 0841   CHOL 179 03/27/2021 0935   TRIG 79.0 08/06/2022 0841   HDL 64.70 08/06/2022 0841   HDL 71 03/27/2021 0935   CHOLHDL 3 08/06/2022 0841   VLDL 15.8 08/06/2022 0841   LDLCALC 101 (H) 08/06/2022 0841   LDLCALC 95 03/27/2021 0935   LDLDIRECT 86.0 09/19/2014 1049    Physical Exam:   VS:  BP (!) 148/84   Pulse (!) 58   Ht 5' (1.524 m)   Wt 174 lb 12.8 oz (79.3 kg)   SpO2 97%   BMI 34.14 kg/m  , BMI Body mass index is 34.14 kg/m. GENERAL:  Well appearing HEENT: Pupils equal round and reactive, fundi not visualized, oral mucosa unremarkable NECK:  No jugular venous distention, waveform within normal limits, carotid upstroke brisk and symmetric, no bruits, no thyromegaly LUNGS:  Clear to auscultation bilaterally HEART:  Irregularly irregular.  PMI not displaced or sustained,S1 and S2 within normal limits, no S3, no S4, no clicks, no rubs, III/VI mid-peaking systolic murmur at the LUSB ABD:  Flat, positive bowel sounds normal in frequency in pitch, no bruits, no rebound, no guarding, no midline pulsatile mass, no hepatomegaly, no splenomegaly EXT:  2 plus pulses throughout, no edema, no cyanosis no clubbing SKIN:  No rashes no nodules NEURO:  Cranial nerves II through XII grossly intact, motor grossly intact throughout PSYCH:  Cognitively intact, oriented to person place and time  ASSESSMENT/PLAN:    # Hypertension Episodes of elevated blood pressure managed with as-needed hydralazine. Blood pressure readings still high on average. -Add hydralazine 10mg  twice daily to lower average blood pressure.  # Persistent Atrial Fibrillation Patient reports feeling unwell, with symptoms of dizziness and shortness of breath,  suggesting possible correlation with atrial fibrillation episodes. -Order a 7-day Zio monitor to assess correlation between symptoms and atrial fibrillation episodes.  # Chronic Itching Persistent despite medication changes. No clear correlation with medication use. -Continue current management with steroid cream as needed.  # Epistaxis Patient reports occasional bloody streaks when blowing nose, likely due to anticoagulation with Eliquis. -No change in management, monitor for worsening symptoms.  Follow-up in a few months to assess response to added hydralazine and results from [facility] monitor.      Screening for Secondary Hypertension:     10/18/2022    3:11 PM  Causes  Drugs/Herbals Screened     - Comments limits added salt.  No caffeine or alcohol.  Renovascular HTN Screened     - Comments abdominal CT-A. Renal Doppler inconclusive 08/2021  Sleep Apnea Screened     - Comments uses CPAP  Thyroid Disease Screened  Hyperaldosteronism Screened     - Comments check renin and aldosterone  Pheochromocytoma Screened     - Comments check catecholamines and metanephrines  Cushing's Syndrome N/A  Hyperparathyroidism Screened  Coarctation of the Aorta Screened  Compliance Screened    Relevant Labs/Studies:    Latest Ref Rng & Units 06/17/2023    4:01 PM 06/01/2023   10:38 AM 04/22/2023  3:05 PM  Basic Labs  Sodium 135 - 146 mmol/L 136  135  138   Potassium 3.5 - 5.3 mmol/L 4.4  4.8  4.0   Creatinine 0.60 - 0.95 mg/dL 8.65  7.84  6.96        Latest Ref Rng & Units 10/06/2022   12:47 PM 06/16/2022   10:03 AM  Thyroid   TSH 0.35 - 5.50 uIU/mL 4.81  1.75        Latest Ref Rng & Units 10/19/2022    8:45 AM  Renin/Aldosterone   Aldosterone 0.0 - 30.0 ng/dL 29.5   Aldos/Renin Ratio 0.0 - 30.0 8.9        Latest Ref Rng & Units 10/19/2022    8:45 AM  Metanephrines/Catecholamines   Epinephrine 0 - 62 pg/mL 33   Norepinephrine 0 - 874 pg/mL 1,538   Dopamine 0 - 48 pg/mL <30    Metanephrines 0.0 - 88.0 pg/mL <25.0   Normetanephrines  0.0 - 297.2 pg/mL 132.4        Latest Ref Rng & Units 09/07/2021    4:31 AM  Cortisol  Cortisol  ug/dL 28.4        04/28/2438    9:03 AM  Renovascular   Renal Artery [place] Completed Yes     Disposition:    FU with MD/PharmD in 2 months   Medication Adjustments/Labs and Tests Ordered: Current medicines are reviewed at length with the patient today.  Concerns regarding medicines are outlined above.  Orders Placed This Encounter  Procedures   LONG TERM MONITOR (3-14 DAYS)   EKG 12-Lead   Meds ordered this encounter  Medications   hydrALAZINE (APRESOLINE) 10 MG tablet    Sig: Take 1 tablet (10 mg total) by mouth in the morning and at bedtime.    Dispense:  60 tablet    Refill:  5     Signed, Chilton Si, MD  07/18/2023 5:45 PM    [facility]

## 2023-07-13 ENCOUNTER — Ambulatory Visit (HOSPITAL_BASED_OUTPATIENT_CLINIC_OR_DEPARTMENT_OTHER): Attending: Internal Medicine | Admitting: Radiology

## 2023-07-13 DIAGNOSIS — G4733 Obstructive sleep apnea (adult) (pediatric): Secondary | ICD-10-CM

## 2023-07-15 ENCOUNTER — Encounter: Payer: Self-pay | Admitting: Family Medicine

## 2023-07-15 ENCOUNTER — Ambulatory Visit (INDEPENDENT_AMBULATORY_CARE_PROVIDER_SITE_OTHER): Admitting: Family Medicine

## 2023-07-15 VITALS — BP 148/82 | HR 68 | Temp 98.2°F | Ht 60.0 in | Wt 174.0 lb

## 2023-07-15 DIAGNOSIS — R040 Epistaxis: Secondary | ICD-10-CM

## 2023-07-15 DIAGNOSIS — H8111 Benign paroxysmal vertigo, right ear: Secondary | ICD-10-CM | POA: Diagnosis not present

## 2023-07-15 DIAGNOSIS — I150 Renovascular hypertension: Secondary | ICD-10-CM | POA: Diagnosis not present

## 2023-07-15 DIAGNOSIS — J069 Acute upper respiratory infection, unspecified: Secondary | ICD-10-CM

## 2023-07-15 DIAGNOSIS — G4733 Obstructive sleep apnea (adult) (pediatric): Secondary | ICD-10-CM | POA: Diagnosis not present

## 2023-07-15 NOTE — Progress Notes (Addendum)
 Ph: [phone] Fax: [phone]   Patient ID: Brooke Beasley, female    DOB: [birth year], 89 y.o.   MRN: [id number]  This visit was conducted in person.  BP (!) 148/82   Pulse 68   Temp 98.2 F (36.8 C) (Oral)   Ht 5' (1.524 m)   Wt 174 lb (78.9 kg)   SpO2 94%   BMI 33.98 kg/m   Orthostatic Vitals for the past 48 hrs (Last 6 readings):  Patient Position Orthostatic BP BP Pulse  07/15/23 1357 -- -- (!) 150/82 68  07/15/23 1404 -- -- (!) 148/82 --  07/15/23 1405 Supine (!) 170/92 -- --  07/15/23 1408 Standing (!) 180/94 -- --    CC: dizziness Subjective:   HPI: Brooke Beasley is a 89 y.o. female presenting on 07/15/2023 for Dizziness (C/o dizziness and nausea. Feels like room is spinning when going to lying down/sitting up position. Sxs started 07/08/23. Pt accompanied by daughter, Brooke Beasley. Also, c/o runny nose and chest congestion. )   1 wk h/o worsened dizziness - room spinning vertigo with position changes. She had a fall last week due to dizziness when getting out of bed - slid into floor. Episodes last <5 min at a time, all positional. She uses lift chair at home.   Also notes rhinorrhea with blood on tissue paper and chest congestion with productive cough of colored mucous, present for the past week. No fevers/chills, ST, ear or tooth pain, tinnitus, hearing loss. She regularly wears bilateral hearing aides. Using OTC expectorant + allegra.    Sees Dr Duke Salvia cardiology 07/04/23 for difficult to control hypertension - continues carvedilol 6.25mg  bid, lasix 20mg  daily, and spironolactone 37.5mg  daily, and most recently started on hydralazine 10mg  bid, ordered Zio heart monitor.   OSA on BiPAP - she is not on supplemental O2 - has updated mask fitting yesterday.      Relevant past medical, surgical, family and social history reviewed and updated as indicated. Interim medical history since our last visit reviewed. Allergies and medications reviewed and  updated. Outpatient Medications Prior to Visit  Medication Sig Dispense Refill   apixaban (ELIQUIS) 5 MG TABS tablet Take 1 tablet (5 mg total) by mouth 2 (two) times daily. 60 tablet 11   Ascorbic Acid (VITAMIN C PO) Take by mouth.     bimatoprost (LUMIGAN) 0.01 % SOLN Place 1 drop into both eyes at bedtime.     carvedilol (COREG) 6.25 MG tablet Take 1 tablet (6.25 mg total) by mouth 2 (two) times daily with a meal. 180 tablet 1   cetirizine (EQ ALLERGY RELIEF, CETIRIZINE,) 10 MG tablet Take 1 tablet (10 mg total) by mouth daily. 90 tablet 0   cholecalciferol (VITAMIN D) 1000 units tablet Take 1,000 Units by mouth daily.     clonazePAM (KLONOPIN) 0.5 MG tablet TAKE 1 TABLET BY MOUTH AT BEDTIME 30 tablet 0   colchicine 0.6 MG tablet Take 1 tablet (0.6 mg total) by mouth daily. 90 tablet 1   DULoxetine (CYMBALTA) 20 MG capsule Take 1 capsule (20 mg total) by mouth daily. 90 capsule 4   EPINEPHrine (EPIPEN 2-PAK) 0.3 mg/0.3 mL IJ SOAJ injection Inject 0.3 mg into the muscle as needed for anaphylaxis. [facility] [place] INJECTION PROTOCOL 2 each 2   famotidine (PEPCID) 20 MG tablet Take 1 tablet (20 mg total) by mouth 2 (two) times daily. 180 tablet 3   Ferrous Sulfate (IRON) 28 MG TABS Take 1 tablet (28 mg total) by mouth once  a week.     furosemide (LASIX) 20 MG tablet Take 1 tablet (20 mg total) by mouth daily. 90 tablet 4   gabapentin (NEURONTIN) 300 MG capsule TAKE 1 CAPSULE BY MOUTH ONCE DAILY AS NEEDED FOR  NERVE  PAIN  (ITCHING) 90 capsule 3   hydrALAZINE (APRESOLINE) 10 MG tablet Take 1 tablet (10 mg total) by mouth in the morning and at bedtime. 60 tablet 5   levothyroxine (SYNTHROID) 100 MCG tablet Take 1 tablet (100 mcg total) by mouth daily before breakfast. 90 tablet 4   loperamide (IMODIUM) 2 MG capsule Take 1 capsule (2 mg total) by mouth 3 (three) times daily as needed for diarrhea or loose stools. 30 capsule 0   MAGNESIUM GLUCONATE PO Take by mouth at bedtime.     mometasone (ELOCON) 0.1 %  ointment Apply topically daily. Shower than ointment applied while still wet daily 45 g 1   Multiple Vitamin (MULTIVITAMIN ADULT PO) Take by mouth daily.     polyethylene glycol (MIRALAX / GLYCOLAX) 17 g packet Take 17 g by mouth daily.     Simethicone 180 MG CAPS Take 1 capsule by mouth daily as needed (indigestion).     spironolactone (ALDACTONE) 25 MG tablet TAKE 1 AND 1/2 TABLETS DAILY 135 tablet 3   timolol (TIMOPTIC) 0.5 % ophthalmic solution Place 1 drop into both eyes every morning.     No facility-administered medications prior to visit.     Per HPI unless specifically indicated in ROS section below Review of Systems  Objective:  BP (!) 148/82   Pulse 68   Temp 98.2 F (36.8 C) (Oral)   Ht 5' (1.524 m)   Wt 174 lb (78.9 kg)   SpO2 94%   BMI 33.98 kg/m   Wt Readings from Last 3 Encounters:  07/15/23 174 lb (78.9 kg)  07/13/23 174 lb (78.9 kg)  07/04/23 174 lb 12.8 oz (79.3 kg)      Physical Exam Vitals and nursing note reviewed.  Constitutional:      Appearance: Normal appearance. She is not ill-appearing.  HENT:     Head: Normocephalic and atraumatic.     Right Ear: External ear normal.     Left Ear: External ear normal.     Ears:     Comments: Wearing hearing aides bilaterally    Nose: Congestion present. No rhinorrhea.     Right Nostril: Epistaxis present.     Left Nostril: Epistaxis present.     Right Sinus: No maxillary sinus tenderness or frontal sinus tenderness.     Left Sinus: No maxillary sinus tenderness or frontal sinus tenderness.     Comments: Dry blood to bilateral nasal mucosa and R nasal septum    Mouth/Throat:     Mouth: Mucous membranes are moist.     Pharynx: Oropharynx is clear. No oropharyngeal exudate or posterior oropharyngeal erythema.  Eyes:     Extraocular Movements: Extraocular movements intact.     Conjunctiva/sclera: Conjunctivae normal.     Pupils: Pupils are equal, round, and reactive to light.  Cardiovascular:     Rate and  Rhythm: Normal rate and regular rhythm.     Pulses: Normal pulses.     Heart sounds: Normal heart sounds. No murmur heard. Pulmonary:     Effort: Pulmonary effort is normal. No respiratory distress.     Breath sounds: No wheezing, rhonchi or rales.     Comments: Coarse breath sounds that largely clear with deep cough  Lymphadenopathy:  Head:     Right side of head: No submental, submandibular, tonsillar, preauricular or posterior auricular adenopathy.     Left side of head: No submental, submandibular, tonsillar, preauricular or posterior auricular adenopathy.     Cervical: No cervical adenopathy.     Right cervical: No superficial cervical adenopathy.    Left cervical: No superficial cervical adenopathy.     Upper Body:     Right upper body: No supraclavicular adenopathy.     Left upper body: No supraclavicular adenopathy.  Skin:    Findings: No rash.  Neurological:     Mental Status: She is alert.     Comments:  Dix hallpike positive on right with upward beat nystagmus  Psychiatric:        Mood and Affect: Mood normal.        Behavior: Behavior normal.       Results for orders placed or performed in visit on 06/20/23  Fecal occult blood, imunochemical   Collection Time: 06/20/23  1:46 PM   Specimen: Stool  Result Value Ref Range   Fecal Occult Bld Negative Negative   *Note: Due to a large number of results and/or encounters for the requested time period, some results have not been displayed. A complete set of results can be found in Results Review.    Assessment & Plan:   Problem List Items Addressed This Visit     Hypertension   Chronic, appreciate cardiology care.       OSA treated with BiPAP   Followed by pulm, on BiPAP.  She doesn't use extra supplemental oxygen.  Recently completed updated mask fitting.       Benign positional vertigo, right - Primary   Story/exam suspicious for BPPV on right. Doubt vestibular neuritis vs other cause.  Epley canalith  repositioning maneuver performed on patient in office, pt tolerated this.  Discussed treatment options - may continue home dramamine, provided with modified epley maneuvers to perform at home, discussed option of outpatient PT referral for vestibular rehab - she declines at this time.  BPPV handout provided.  Update with effect.       Viral URI with cough   Anticipate viral process  Supportive measures reviewed Update if not improving with treatment.       Epistaxis   Anticipate BiPAP use contributing, possibly drying out nasal epithelium.  She is also on eliquis Rec hold nasal steroid at this time, start more regular nasal saline irrigation, consider vasoline to nose.        No orders of the defined types were placed in this encounter.   No orders of the defined types were placed in this encounter.   Patient Instructions  I think you have positional vertigo due to inner ear issue on the right Try exercises provided today  If no better let me know for referral to vestibular rehab.  Try to limit sudden head turns.   You likely have a viral upper respiratory infection. Antibiotics are not needed for this.  Viral infections usually take 7-10 days to resolve.  The cough can last a few weeks to go away. For nose bleeds - use nasal saline or vaseline to nose.  Push fluids and plenty of rest. Please return if you are not improving as expected, or if you have high fevers (>101.5) or difficulty swallowing or worsening productive cough. Call clinic with questions.  Good to see you today. I hope you start feeling better soon.   Follow up plan: Return  if symptoms worsen or fail to improve.  Eustaquio Boyden, MD

## 2023-07-15 NOTE — Patient Instructions (Addendum)
 I think you have positional vertigo due to inner ear issue on the right Try exercises provided today  If no better let me know for referral to vestibular rehab.  Try to limit sudden head turns.   You likely have a viral upper respiratory infection. Antibiotics are not needed for this.  Viral infections usually take 7-10 days to resolve.  The cough can last a few weeks to go away. For nose bleeds - use nasal saline or vaseline to nose.  Push fluids and plenty of rest. Please return if you are not improving as expected, or if you have high fevers (>101.5) or difficulty swallowing or worsening productive cough. Call clinic with questions.  Good to see you today. I hope you start feeling better soon.

## 2023-07-16 ENCOUNTER — Encounter: Payer: Self-pay | Admitting: Family Medicine

## 2023-07-16 ENCOUNTER — Other Ambulatory Visit: Payer: Self-pay | Admitting: Family Medicine

## 2023-07-16 DIAGNOSIS — F418 Other specified anxiety disorders: Secondary | ICD-10-CM

## 2023-07-16 DIAGNOSIS — H8111 Benign paroxysmal vertigo, right ear: Secondary | ICD-10-CM | POA: Insufficient documentation

## 2023-07-16 DIAGNOSIS — J069 Acute upper respiratory infection, unspecified: Secondary | ICD-10-CM | POA: Insufficient documentation

## 2023-07-16 DIAGNOSIS — R040 Epistaxis: Secondary | ICD-10-CM | POA: Insufficient documentation

## 2023-07-16 NOTE — Assessment & Plan Note (Signed)
 Anticipate BiPAP use contributing, possibly drying out nasal epithelium.  She is also on eliquis Rec hold nasal steroid at this time, start more regular nasal saline irrigation, consider vasoline to nose.

## 2023-07-16 NOTE — Assessment & Plan Note (Addendum)
 Story/exam suspicious for BPPV on right. Doubt vestibular neuritis vs other cause.  Epley canalith repositioning maneuver performed on patient in office, pt tolerated this.  Discussed treatment options - may continue home dramamine, provided with modified epley maneuvers to perform at home, discussed option of outpatient PT referral for vestibular rehab - she declines at this time.  BPPV handout provided.  Update with effect.

## 2023-07-16 NOTE — Assessment & Plan Note (Signed)
 Chronic, appreciate cardiology care.

## 2023-07-16 NOTE — Assessment & Plan Note (Signed)
 Anticipate viral process  Supportive measures reviewed Update if not improving with treatment.

## 2023-07-16 NOTE — Assessment & Plan Note (Signed)
 Followed by pulm, on BiPAP.  She doesn't use extra supplemental oxygen.  Recently completed updated mask fitting.

## 2023-07-18 ENCOUNTER — Encounter (HOSPITAL_BASED_OUTPATIENT_CLINIC_OR_DEPARTMENT_OTHER): Payer: Self-pay | Admitting: *Deleted

## 2023-07-18 ENCOUNTER — Telehealth: Payer: Self-pay | Admitting: Cardiovascular Disease

## 2023-07-18 DIAGNOSIS — I480 Paroxysmal atrial fibrillation: Secondary | ICD-10-CM | POA: Diagnosis not present

## 2023-07-18 DIAGNOSIS — I10 Essential (primary) hypertension: Secondary | ICD-10-CM

## 2023-07-18 MED ORDER — HYDRALAZINE HCL 25 MG PO TABS: 25.0000 mg | ORAL_TABLET | Freq: Two times a day (BID) | ORAL | 3 refills | Status: AC

## 2023-07-18 MED ORDER — CARVEDILOL 3.125 MG PO TABS: 3.1250 mg | ORAL_TABLET | Freq: Two times a day (BID) | ORAL | 3 refills | Status: DC

## 2023-07-18 NOTE — Telephone Encounter (Signed)
Calling with abnormal results. Please advise  

## 2023-07-18 NOTE — Telephone Encounter (Signed)
 Advised patient and daughter, updated Rx's Daughter was asking about a new medication for Afib Advised would discuss with Dr Duke Salvia and call back if anything additional to be added.  If not keep f/u as scheduled

## 2023-07-18 NOTE — Telephone Encounter (Addendum)
 Spoke with [facility]  On 3/11 patient had episode of Afib, HR 38 lasting 60 seconds Page 19 strip 1  According to monitor diary patient was having shortness of breath   Will forward to Dr Duke Salvia for review

## 2023-07-18 NOTE — Telephone Encounter (Signed)
 Name of Medication:  Clonazepam Name of Pharmacy:  [facility] Last Fill or Written Date and Quantity:  06/21/23, #30 Last Office Visit and Type:  07/15/23, dizzines Next Office Visit and Type:  08/22/23, CPE Last Controlled Substance Agreement Date:  09/26/14 Last UDS:  09/26/14

## 2023-07-18 NOTE — Telephone Encounter (Signed)
-----   Message from [facility] sent at 07/18/2023  5:46 PM EDT ----- Monitor showed that she is in atrial fibrillation continuously.  Occasionally her heart rate gets into the 30s.  Recommend reducing the carvedilol to 3.125mg .  This will cause her BP to rise.  Recommend changing the hydralazine from 10mg  to 25mg .

## 2023-07-20 ENCOUNTER — Ambulatory Visit (INDEPENDENT_AMBULATORY_CARE_PROVIDER_SITE_OTHER): Payer: PPO

## 2023-07-20 VITALS — Ht 60.0 in | Wt 174.0 lb

## 2023-07-20 DIAGNOSIS — Z0000 Encounter for general adult medical examination without abnormal findings: Secondary | ICD-10-CM

## 2023-07-20 NOTE — Telephone Encounter (Signed)
 ERx

## 2023-07-20 NOTE — Progress Notes (Addendum)
 Subjective:   Brooke Beasley is a 89 y.o. who presents for a Medicare Wellness preventive visit.  Visit Complete: Virtual I connected with  Brooke Beasley on 07/20/23 by a audio enabled telemedicine application and verified that I am speaking with the correct person using two identifiers.  Patient Location: Home  Provider Location: Home Office  I discussed the limitations of evaluation and management by telemedicine. The patient expressed understanding and agreed to proceed.  Vital Signs: Because this visit was a virtual/telehealth visit, some criteria may be missing or patient reported. Any vitals not documented were not able to be obtained and vitals that have been documented are patient reported.  VideoDeclined- This patient declined Librarian, academic. Therefore the visit was completed with audio only.  Persons Participating in Visit: Patient.  AWV Questionnaire: No: Patient Medicare AWV questionnaire was not completed prior to this visit.  Cardiac Risk Factors include: advanced age (>50men, >20 women);hypertension;obesity (BMI >30kg/m2)     Objective:    Today's Vitals   07/20/23 1334  Weight: 174 lb (78.9 kg)  Height: 5' (1.524 m)   Body mass index is 33.98 kg/m.     07/20/2023    1:38 PM 11/19/2021    7:08 AM 09/06/2021    5:00 PM 08/12/2021    9:12 AM 07/27/2021    8:06 AM 07/24/2021    2:00 PM 07/23/2021    1:27 PM  Advanced Directives  Does Patient Have a Medical Advance Directive? Yes Yes Yes Yes Yes  Yes  Type of Estate agent of [place];Living will Healthcare Power of [place];Living will Healthcare Power of [place];Living will Healthcare Power of [place];Living will     Does patient want to make changes to medical advance directive? No - Patient declined  No - Guardian declined No - Patient declined  No - Patient declined No - Patient declined  Copy of Healthcare Power of Attorney in Chart? Yes  - validated most recent copy scanned in chart (See row information) Yes - validated most recent copy scanned in chart (See row information) No - copy requested No - copy requested     Would patient like information on creating a medical advance directive?    No - Patient declined       Current Medications (verified) Outpatient Encounter Medications as of 07/20/2023  Medication Sig   apixaban (ELIQUIS) 5 MG TABS tablet Take 1 tablet (5 mg total) by mouth 2 (two) times daily.   Ascorbic Acid (VITAMIN C PO) Take by mouth.   bimatoprost (LUMIGAN) 0.01 % SOLN Place 1 drop into both eyes at bedtime.   carvedilol (COREG) 3.125 MG tablet Take 1 tablet (3.125 mg total) by mouth 2 (two) times daily with a meal.   cetirizine (EQ ALLERGY RELIEF, CETIRIZINE,) 10 MG tablet Take 1 tablet (10 mg total) by mouth daily.   cholecalciferol (VITAMIN D) 1000 units tablet Take 1,000 Units by mouth daily.   clonazePAM (KLONOPIN) 0.5 MG tablet TAKE 1 TABLET BY MOUTH AT BEDTIME   colchicine 0.6 MG tablet Take 1 tablet (0.6 mg total) by mouth daily.   DULoxetine (CYMBALTA) 20 MG capsule Take 1 capsule (20 mg total) by mouth daily.   EPINEPHrine (EPIPEN 2-PAK) 0.3 mg/0.3 mL IJ SOAJ injection Inject 0.3 mg into the muscle as needed for anaphylaxis. Geoffry Paradise INJECTION PROTOCOL   famotidine (PEPCID) 20 MG tablet Take 1 tablet (20 mg total) by mouth 2 (two) times daily.   Ferrous Sulfate (IRON) 28  MG TABS Take 1 tablet (28 mg total) by mouth once a week.   furosemide (LASIX) 20 MG tablet Take 1 tablet (20 mg total) by mouth daily.   gabapentin (NEURONTIN) 300 MG capsule TAKE 1 CAPSULE BY MOUTH ONCE DAILY AS NEEDED FOR  NERVE  PAIN  (ITCHING)   hydrALAZINE (APRESOLINE) 25 MG tablet Take 1 tablet (25 mg total) by mouth in the morning and at bedtime.   levothyroxine (SYNTHROID) 100 MCG tablet Take 1 tablet (100 mcg total) by mouth daily before breakfast.   loperamide (IMODIUM) 2 MG capsule Take 1 capsule (2 mg total) by mouth 3  (three) times daily as needed for diarrhea or loose stools.   MAGNESIUM GLUCONATE PO Take by mouth at bedtime.   mometasone (ELOCON) 0.1 % ointment Apply topically daily. Shower than ointment applied while still wet daily   Multiple Vitamin (MULTIVITAMIN ADULT PO) Take by mouth daily.   polyethylene glycol (MIRALAX / GLYCOLAX) 17 g packet Take 17 g by mouth daily.   Simethicone 180 MG CAPS Take 1 capsule by mouth daily as needed (indigestion).   spironolactone (ALDACTONE) 25 MG tablet TAKE 1 AND 1/2 TABLETS DAILY   timolol (TIMOPTIC) 0.5 % ophthalmic solution Place 1 drop into both eyes every morning.   [DISCONTINUED] clonazePAM (KLONOPIN) 0.5 MG tablet TAKE 1 TABLET BY MOUTH AT BEDTIME   No facility-administered encounter medications on file as of 07/20/2023.    Allergies (verified) Ace inhibitors, Amlodipine, Carvedilol, Chlorthalidone, Montelukast, Other, Risedronate sodium, Simvastatin, Prolia [denosumab], Alendronate sodium, Influenza vaccines, Losartan, Paroxetine hcl, Silenor [doxepin hcl], Sulfonamide derivatives, and Toprol xl [metoprolol]   History: Past Medical History:  Diagnosis Date   Anxiety    Basal cell carcinoma, arm, left 02/2023   L mid lateral posterior arm ([place])   C. difficile diarrhea 07/11/2018   S/p multiple oral vanc treatments (course and tapers) and completed Zinplava monoclonal Ab treatment (05/10/2019) Fecal transplant on hold during COVID pandemic   CAP (community acquired pneumonia) 12/22/2017   Carotid stenosis 10/18/2014   R 1-39%, L 40-59%, rpt 1 yr (09/2014)    CKD (chronic kidney disease) stage 3, GFR 30-59 ml/min (HCC) 08/02/2014   Closed fracture of right orbit (HCC) 02/10/2021   Clostridioides difficile infection    hx 2020   COVID-19 virus infection 10/13/2020   Degenerative disc disease    LS   Depression    nervous breakdonw in 1964-out of work for a year   Diverticulosis    severe by colonoscopy   Family history of adverse reaction to  anesthesia    n/v   GERD (gastroesophageal reflux disease)    Glaucoma    Heart murmur 08/2011   mitral regurge - on echo    History of hiatal hernia    History of shingles    HTN (hypertension)    Hyperlipidemia    Hypertension    Hypothyroidism    IBS 11/02/2006   Influenza A 04/20/2022   Intestinal bacterial overgrowth    In small colon   Left shoulder pain 11/04/2014   Lower GI bleed 12/2020   thought diverticular complicated by ABLA with syncope and orbital fracture s/p hospitalization   Maxillary fracture (HCC) 04/08/2012   Osteoarthritis 2016   ([facility])   Osteoporosis    dexa 2011   Perirectal abscess 08/03/2021   CT guided aspiration of abscess grow Proteums mirabilis and Citrobacter koseri 07/2021, currently receiving IV Zosyn planned total 6 wks.   Pseudogout 2016   shoulders ([place])  Strep pharyngitis 04/20/2022   Past Surgical History:  Procedure Laterality Date   barium enema  2012   severe diverticulosis, redundant colon   Bowel obstruction  1999   no surgery in hosp x 3 days   BUBBLE STUDY  07/27/2021   Procedure: BUBBLE STUDY;  Surgeon: Orpah Cobb, MD;  Location[facility] ENDOSCOPY;  Service: Cardiovascular;;   CARDIOVERSION N/A 11/19/2021   Procedure: CARDIOVERSION;  Surgeon: Debbe Odea, MD;  Location: [facility] ORS;  Service: Cardiovascular;  Laterality: N/A;   COLONOSCOPY  1. 1999  2. 11/04   1. Not finished  2. Slight hemorrhage rectosigmoid area, severe sig diverticulosis   COLONOSCOPY WITH PROPOFOL N/A 09/10/2019   SSP with dysplasia, TA, rpt 3 yrs Allegra Lai, Loel Dubonnet, MD)   Dexa  1. [phone]   2. 9/04  3. 3/08    1. OP  2. OP, borderline, spine -2.44T  3. decreased BMD-OP   DG KNEE 1-2 VIEWS BILAT     LS x-ray with degenerative disc and facet change   ESOPHAGOGASTRODUODENOSCOPY     Negative   FLEXIBLE SIGMOIDOSCOPY N/A 01/16/2021   Procedure: FLEXIBLE SIGMOIDOSCOPY;  Surgeon: Iva Boop, MD;  Location[facility] END[facility]COPY;  Service:  Endoscopy;  Laterality: N/A;  or unsedated   Hemorrhoid procedure  07/2006   PICC LINE REMOVAL ([facility] HX)  08/28/2021   PROCTOSCOPY N/A 05/26/2021   Procedure: RIGID PROCTOSCOPY;  Surgeon: Karie Soda, MD;  Location: [facility] ORS;  Service: General;  Laterality: N/A;   RECTOPEXY N/A 05/26/2021   Procedure: RECTOPEXY;  Surgeon: Karie Soda, MD;  Location: [facility] ORS;  Service: General;  Laterality: N/A;   TEE WITHOUT CARDIOVERSION N/A 07/27/2021   Procedure: TRANSESOPHAGEAL ECHOCARDIOGRAM (TEE);  Surgeon: Orpah Cobb, MD;  Location: [facility] ENDOSCOPY;  Service: Cardiovascular;  Laterality: N/A;   TONSILLECTOMY     TOTAL SHOULDER ARTHROPLASTY Left 11/27/2015   Christena Flake, MD   TOTAL SHOULDER REVISION Left 03/16/2016   Procedure: TOTAL SHOULDER REVISION;  Surgeon: Christena Flake, MD;  Location: [facility] ORS;  Service: Orthopedics;  Laterality: Left;   US ECHOCARDIOGRAPHY  07/2011   Normal systolic fxn with EF 55-60%.  Focal basal septal hypertrophy.  Mild diastolic dysfunction.  Mild MR.   XI ROBOTIC ASSISTED LOWER ANTERIOR RESECTION N/A 05/26/2021   Procedure: ROBOTIC LOW ANTERIOR RECTOSIGMOID RESECTION; ASSESSMENT OF TISSUE PERFUSION WITH FIREFLY;  Surgeon: Karie Soda, MD;  Location: [facility] ORS;  Service: General;  Laterality: N/A;   Family History  Problem Relation Age of Onset   Diabetes Brother        post-op   Coronary artery disease Brother    Diabetes Paternal Grandfather    Breast cancer Cousin    Social History   Socioeconomic History   Marital status: Widowed    Spouse name: Not on file   Number of children: 2   Years of education: Not on file   Highest education level: 12th grade  Occupational History   Occupation: Retired    Associate Professor: RETIRED  Tobacco Use   Smoking status: Never   Smokeless tobacco: Never  Vaping Use   Vaping status: Never Used  Substance and Sexual Activity   Alcohol use: No   Drug use: No   Sexual activity: Not Currently  Other Topics Concern   Not on  file  Social History Narrative   Left handed   Widow. Husband ([name]) deceased April 12, 2016 from dementia. She was caregiver.    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   Activity: no regular exercise   Diet: good water, fruits/vegetables daily      Patient Care Team:   Eustaquio Boyden, MD as PCP - General (Family Medicine)   Debbe Odea, MD as PCP - Cardiology (Cardiology)   Otho Ket, RN as [facility] Care Management   Gross, Viviann Spare, MD as Consulting Physician (General Surgery)   Toney Reil, MD as Consulting Physician (Gastroenterology)   Debbe Odea, MD as Consulting Physician (Cardiology)   Social Drivers of Health   Financial Resource Strain: Low Risk  (07/20/2023)   Overall Financial Resource Strain (CARDIA)    Difficulty of Paying Living Expenses: Not hard at all  Food Insecurity: No Food Insecurity (07/20/2023)   Hunger Vital Sign    Worried About Running Out of Food in the Last Year: Never true    Ran Out of Food in the Last Year: Never true  Transportation Needs: No Transportation Needs (07/20/2023)   PRAPARE - Transportation    Lack of Transportation (Medical): No    Lack of Transportation (Non-Medical): No  Physical Activity: Inactive (07/20/2023)   Exercise Vital Sign    Days of Exercise per Week: 0 days    Minutes of Exercise per Session: 0 min  Stress: No Stress Concern Present (07/20/2023)   [employer] of Occupational Health - Occupational Stress Questionnaire    Feeling of Stress : Not at all  Social Connections: Socially Isolated (07/20/2023)   Social Connection and Isolation Panel [NHANES]    Frequency of Communication with Friends and Family: Three times a week    Frequency of Social Gatherings with Friends and Family: Once a week    Attends Religious Services: Never    Database administrator or Organizations: No    Attends Banker  Meetings: Never    Marital Status: Widowed    Tobacco Counseling Counseling given: Not Answered    Clinical Intake:  Pre-visit preparation completed: Yes  Pain : No/denies pain     BMI - recorded: 33.98 Nutritional Status: BMI > 30  Obese Nutritional Risks: None Diabetes: No  Lab Results  Component Value Date   HGBA1C 5.7 08/06/2022   HGBA1C 5.6 05/22/2021   HGBA1C 5.7 (H) 01/15/2021     How often do you need to have someone help you when you read instructions, pamphlets, or other written materials from your doctor or pharmacy?: 1 - Never  Interpreter Needed?: No  Information entered by :: Lanier Ensign, LPN   Activities of Daily Living     07/20/2023    1:36 PM  In your present state of health, do you have any difficulty performing the following activities:  Hearing? 1  Comment hearing aid  Vision? 0  Difficulty concentrating or making decisions? 0  Walking or climbing stairs? 1  Dressing or bathing? 0  Doing errands, shopping? 0  Preparing Food and eating ? Y  Comment daughter prepares  Using the Toilet? N  In the past six months, have you accidently leaked urine? Y  Comment wears a pull up  Do you have problems with loss of bowel control? N  Managing your Medications? N  Managing your Finances? N  Housekeeping or managing your Housekeeping? N    Patient Care Team: Eustaquio Boyden, MD as PCP - General (Family Medicine) Lanier Prude, MD as PCP - Electrophysiology (Cardiology) Otho Ket, RN as [facility] Management Gross, Viviann Spare,  MD as Consulting Physician (General Surgery) Toney Reil, MD as Consulting Physician (Gastroenterology)  Indicate any recent Medical Services you may have received from other than [facility] providers in the past year (date may be approximate).     Assessment:   This is a routine wellness examination for Brooke Beasley.  Hearing/Vision screen Hearing Screening - Comments:: Pt has hearing aids   Vision Screening - Comments:: Wears rx glasses - up to date with routine eye exams with     Goals Addressed               This Visit's Progress     maintain health and activity (pt-stated)         Depression Screen     07/20/2023    1:42 PM 07/15/2023    2:13 PM 06/23/2023    9:25 AM 11/17/2022   12:41 PM 08/13/2022    4:00 PM 06/16/2022    9:18 AM 10/28/2021    4:00 PM  PHQ 2/9 Scores  PHQ - 2 Score 0 0 3 0 2 4 0  PHQ- 9 Score 0  12  10 19      Fall Risk     07/20/2023    1:39 PM 08/13/2022    3:59 PM 06/16/2022    9:18 AM 10/28/2021    4:00 PM 08/26/2021    2:16 PM  Fall Risk   Falls in the past year? 1 1 1 1 1   Number falls in past yr: 0 0 0 1 1  Injury with Fall? 0  1 1 1   Risk for fall due to : Impaired balance/gait;Impaired mobility;History of fall(s)    History of fall(s);Impaired balance/gait;Impaired mobility  Follow up Falls prevention discussed   Falls evaluation completed Falls evaluation completed    MEDICARE RISK AT HOME:  Medicare Risk at Home Any stairs in or around the home?: Yes If so, are there any without handrails?: No Home free of loose throw rugs in walkways, pet beds, electrical cords, etc?: Yes Adequate lighting in your home to reduce risk of falls?: Yes Life alert?: No Use of a cane, walker or w/c?: Yes Grab bars in the bathroom?: Yes Shower chair or bench in shower?: Yes Elevated toilet seat or a handicapped toilet?: No  TIMED UP AND GO:  Was the test performed?  No  Cognitive Function: 6CIT completed    10/20/2018   10:13 AM 10/13/2017    9:32 AM 10/08/2016    8:57 AM  MMSE - Mini Mental State Exam  Orientation to time 5 5 5   Orientation to Place 5 5 5   Registration 3 3 3   Attention/ Calculation 0 0 0  Recall 3 1 3   Recall-comments  unable to recall 2 of 3 words   Language- name 2 objects 0 0 0  Language- repeat 1 1 1   Language- follow 3 step command 0 3 3  Language- read & follow direction 0 0 0  Write a sentence 0 0 0  Copy  design 0 0 0  Total score 17 18 20         07/20/2023    1:40 PM  6CIT Screen  What Year? 0 points  What month? 0 points  What time? 0 points  Count back from 20 0 points  Months in reverse 0 points  Repeat phrase 0 points  Total Score 0 points    Immunizations Immunization History  Administered Date(s) Administered   PFIZER(Purple Top)SARS-COV-2 Vaccination 08/10/2019, 08/31/2019   Pneumococcal Conjugate-13  06/08/2013   Pneumococcal Polysaccharide-23 04/26/2002   Td 04/26/1998, 10/22/2008   Zoster, Live 05/11/2010    Screening Tests Health Maintenance  Topic Date Due   Zoster Vaccines- Shingrix (1 of 2) 11/21/1949   DTaP/Tdap/Td (3 - Tdap) 10/23/2018   COVID-19 Vaccine (3 - Pfizer risk series) 09/28/2019   Colonoscopy  09/10/2022   INFLUENZA VACCINE  07/25/2023 (Originally 11/25/2022)   MAMMOGRAM  10/08/2023   Medicare Annual Wellness (AWV)  07/19/2024   Pneumonia Vaccine 81+ Years old  Completed   DEXA SCAN  Completed   HPV VACCINES  Aged Out    Health Maintenance  Health Maintenance Due  Topic Date Due   Zoster Vaccines- Shingrix (1 of 2) 11/21/1949   DTaP/Tdap/Td (3 - Tdap) 10/23/2018   COVID-19 Vaccine (3 - Pfizer risk series) 09/28/2019   Colonoscopy  09/10/2022   Health Maintenance Items Addressed: See Nurse Notes  Additional Screening:  Vision Screening: Recommended annual ophthalmology exams for early detection of glaucoma and other disorders of the eye.  Dental Screening: Recommended annual dental exams for proper oral hygiene  Community Resource Referral / Chronic Care Management: CRR required this visit?  No   CCM required this visit?  No     Plan:     I have personally reviewed and noted the following in the patient's chart:   Medical and social history Use of alcohol, tobacco or illicit drugs  Current medications and supplements including opioid prescriptions. Patient is not currently taking opioid prescriptions. Functional ability  and status Nutritional status Physical activity Advanced directives List of other physicians Hospitalizations, surgeries, and ER visits in previous 12 months Vitals Screenings to include cognitive, depression, and falls Referrals and appointments  In addition, I have reviewed and discussed with patient certain preventive protocols, quality metrics, and best practice recommendations. A written personalized care plan for preventive services as well as general preventive health recommendations were provided to patient.     Marzella Schlein, LPN   09/11/8414   After Visit Summary: (MyChart) Due to this being a telephonic visit, the after visit summary with patients personalized plan was offered to patient via MyChart   Notes: Nothing significant to report at this time.

## 2023-07-20 NOTE — Telephone Encounter (Signed)
 She is already on Eliquis as a blood thinner for A-fib.  Her heart rate is too low for me to give her any other heart rate control medications.  This is why we had to reduce the carvedilol.  She can be seen by the A-fib clinic if she would like but there is no other medication I can give her for her atrial fibrillation at this time.  Lorien Shingler C. Duke Salvia, MD, [facility]

## 2023-07-20 NOTE — Patient Instructions (Signed)
 Brooke Beasley , Thank you for taking time to come for your Medicare Wellness Visit. I appreciate your ongoing commitment to your health goals. Please review the following plan we discussed and let me know if I can assist you in the future.   Referrals/Orders/Follow-Ups/Clinician Recommendations: Each day, aim for 6 glasses of water, plenty of protein in your diet and try to get up and walk/ stretch every hour for 5-10 minutes at a time.    This is a list of the screening recommended for you and due dates:  Health Maintenance  Topic Date Due   Zoster (Shingles) Vaccine (1 of 2) 11/21/1949   DTaP/Tdap/Td vaccine (3 - Tdap) 10/23/2018   COVID-19 Vaccine (3 - Pfizer risk series) 09/28/2019   Colon Cancer Screening  09/10/2022   Flu Shot  07/25/2023*   Mammogram  10/08/2023   Medicare Annual Wellness Visit  07/19/2024   Pneumonia Vaccine  Completed   DEXA scan (bone density measurement)  Completed   HPV Vaccine  Aged Out  *Topic was postponed. The date shown is not the original due date.    Advanced directives: (In Chart) A copy of your advanced directives are scanned into your chart should your provider ever need it.  Next Medicare Annual Wellness Visit scheduled for next year: Yes

## 2023-07-21 DIAGNOSIS — G4733 Obstructive sleep apnea (adult) (pediatric): Secondary | ICD-10-CM | POA: Diagnosis not present

## 2023-07-21 DIAGNOSIS — J9610 Chronic respiratory failure, unspecified whether with hypoxia or hypercapnia: Secondary | ICD-10-CM | POA: Diagnosis not present

## 2023-07-22 NOTE — Telephone Encounter (Signed)
  Note She is already on Eliquis as a blood thinner for A-fib.  Her heart rate is too low for me to give her any other heart rate control medications.  This is why we had to reduce the carvedilol.  She can be seen by the A-fib clinic if she would like but there is no other medication I can give her for her atrial fibrillation at this time.   Tiffany C. Duke Salvia, MD, [facility]     Reviewed above with patient, does not want referral at this time. Advised if anything changes to call back   Mychart message sent as well so daughter could review

## 2023-07-27 ENCOUNTER — Ambulatory Visit: Payer: Self-pay

## 2023-07-27 NOTE — Telephone Encounter (Signed)
  Chief Complaint: productive cough Symptoms: green phlegm Frequency: about a month Pertinent Negatives: Patient denies CP,  Disposition: [] ED /[] Urgent Care (no appt availability in office) / [x] Appointment(In office/virtual)/ []  [place] [facility]/ [] Home Care/ [] Refused Recommended Disposition /[] [place] [facility]/ []  Follow-up with PCP Additional Notes: Pt states she was recently seen and told she had a virus. Pt states that her cough has gotten worse and that she feels she needs a chest xr. Pt states that she is coughing up green phlegm. Pt states that she is unsure if she has a fever. Pt states that she isn't doing much but sitting and that she has occasional SOB that causes her to sit more. Pt scheduled for tomorrow. Copied from CRM [id number]. Topic: Clinical - Red Word Triage >> Jul 27, 2023 12:23 PM Cammy Copa D wrote: Red Word that prompted transfer to Nurse Triage: Chilton Si Mucus, PT concerned about possible pneumonia, PT saw Dr. Reece Agar 07/15/23 but symptoms have not improved. Reason for Disposition  Cough has been present for > 3 weeks  Answer Assessment - Initial Assessment Questions 1. ONSET: "When did the cough begin?"      A month 2. SEVERITY: "How bad is the cough today?"      moderate 3. SPUTUM: "Describe the color of your sputum" (none, dry cough; clear, white, yellow, green)     green 4. HEMOPTYSIS: "Are you coughing up any blood?" If so ask: "How much?" (flecks, streaks, tablespoons, etc.)     denies 5. DIFFICULTY BREATHING: "Are you having difficulty breathing?" If Yes, ask: "How bad is it?" (e.g., mild, moderate, severe)    - MILD: No SOB at rest, mild SOB with walking, speaks normally in sentences, can lie down, no retractions, pulse < 100.    - MODERATE: SOB at rest, SOB with minimal exertion and prefers to sit, cannot lie down flat, speaks in phrases, mild retractions, audible wheezing, pulse 100-120.    - SEVERE: Very SOB at rest, speaks in single words, struggling to  breathe, sitting hunched forward, retractions, pulse > 120      Mild 6. FEVER: "Do you have a fever?" If Yes, ask: "What is your temperature, how was it measured, and when did it start?"     denies 7. CARDIAC HISTORY: "Do you have any history of heart disease?" (e.g., heart attack, congestive heart failure)      afib 8. LUNG HISTORY: "Do you have any history of lung disease?"  (e.g., pulmonary embolus, asthma, emphysema)     denies 9. PE RISK FACTORS: "Do you have a history of blood clots?" (or: recent major surgery, recent prolonged travel, bedridden)     More sedentary than normal for pt 10. OTHER SYMPTOMS: "Do you have any other symptoms?" (e.g., runny nose, wheezing, chest pain)       Stuffed up nose 12. TRAVEL: "Have you traveled out of the country in the last month?" (e.g., travel history, exposures)       denies  Protocols used: Cough - Acute Productive-A-AH

## 2023-07-27 NOTE — Telephone Encounter (Signed)
 Thanks to Dr Alphonsus Sias for seeing her tomorrow am.

## 2023-07-27 NOTE — Telephone Encounter (Signed)
 I will check her tomorrow morning

## 2023-07-28 ENCOUNTER — Ambulatory Visit (INDEPENDENT_AMBULATORY_CARE_PROVIDER_SITE_OTHER): Admitting: Internal Medicine

## 2023-07-28 ENCOUNTER — Encounter: Payer: Self-pay | Admitting: Internal Medicine

## 2023-07-28 ENCOUNTER — Ambulatory Visit
Admission: RE | Admit: 2023-07-28 | Discharge: 2023-07-28 | Disposition: A | Discharge status: Home / Self Care | Source: Ambulatory Visit | Attending: Internal Medicine | Admitting: Internal Medicine

## 2023-07-28 ENCOUNTER — Ambulatory Visit: Attending: Otolaryngology

## 2023-07-28 VITALS — BP 112/80 | HR 86 | Temp 98.6°F | Ht 60.0 in | Wt 173.0 lb

## 2023-07-28 DIAGNOSIS — E119 Type 2 diabetes mellitus without complications: Secondary | ICD-10-CM | POA: Insufficient documentation

## 2023-07-28 DIAGNOSIS — I4891 Unspecified atrial fibrillation: Secondary | ICD-10-CM | POA: Insufficient documentation

## 2023-07-28 DIAGNOSIS — R058 Other specified cough: Secondary | ICD-10-CM | POA: Diagnosis not present

## 2023-07-28 DIAGNOSIS — Z9981 Dependence on supplemental oxygen: Secondary | ICD-10-CM | POA: Insufficient documentation

## 2023-07-28 DIAGNOSIS — I10 Essential (primary) hypertension: Secondary | ICD-10-CM | POA: Diagnosis not present

## 2023-07-28 DIAGNOSIS — J45909 Unspecified asthma, uncomplicated: Secondary | ICD-10-CM | POA: Diagnosis not present

## 2023-07-28 DIAGNOSIS — E669 Obesity, unspecified: Secondary | ICD-10-CM | POA: Diagnosis not present

## 2023-07-28 DIAGNOSIS — G4733 Obstructive sleep apnea (adult) (pediatric): Secondary | ICD-10-CM | POA: Insufficient documentation

## 2023-07-28 DIAGNOSIS — I471 Supraventricular tachycardia, unspecified: Secondary | ICD-10-CM | POA: Insufficient documentation

## 2023-07-28 DIAGNOSIS — Z96612 Presence of left artificial shoulder joint: Secondary | ICD-10-CM | POA: Diagnosis not present

## 2023-07-28 DIAGNOSIS — Z6832 Body mass index (BMI) 32.0-32.9, adult: Secondary | ICD-10-CM | POA: Diagnosis not present

## 2023-07-28 MED ORDER — AMOXICILLIN-POT CLAVULANATE 875-125 MG PO TABS: 1.0000 | ORAL_TABLET | Freq: Two times a day (BID) | ORAL | 0 refills | Status: DC

## 2023-07-28 NOTE — Progress Notes (Signed)
 Subjective:    Patient ID: Brooke Beasley, female    DOB: May 27, 1930, 89 y.o.   MRN: [id number]  HPI Here with daughter due to persistent respiratory symptoms  Started about 3 weeks ago Had inner ear symptoms and apparent virus Lots of cough--some yellow sputum Now sore throat Concerned about pneumonia--chest congestion, and trouble breathing No fever--99 last night though Some sweat last night No ear pain--but right ear seemed red last night and is itchy Some headache--frontal  Using mucinex/tylenol/allegra--no clear help Saline nasal spray---?slight help  Current Outpatient Medications on File Prior to Visit  Medication Sig Dispense Refill   apixaban (ELIQUIS) 5 MG TABS tablet Take 1 tablet (5 mg total) by mouth 2 (two) times daily. 60 tablet 11   Ascorbic Acid (VITAMIN C PO) Take by mouth.     bimatoprost (LUMIGAN) 0.01 % SOLN Place 1 drop into both eyes at bedtime.     carvedilol (COREG) 3.125 MG tablet Take 1 tablet (3.125 mg total) by mouth 2 (two) times daily with a meal. 180 tablet 3   cetirizine (EQ ALLERGY RELIEF, CETIRIZINE,) 10 MG tablet Take 1 tablet (10 mg total) by mouth daily. 90 tablet 0   cholecalciferol (VITAMIN D) 1000 units tablet Take 1,000 Units by mouth daily.     clonazePAM (KLONOPIN) 0.5 MG tablet TAKE 1 TABLET BY MOUTH AT BEDTIME 30 tablet 0   colchicine 0.6 MG tablet Take 1 tablet (0.6 mg total) by mouth daily. 90 tablet 1   DULoxetine (CYMBALTA) 20 MG capsule Take 1 capsule (20 mg total) by mouth daily. 90 capsule 4   EPINEPHrine (EPIPEN 2-PAK) 0.3 mg/0.3 mL IJ SOAJ injection Inject 0.3 mg into the muscle as needed for anaphylaxis. Geoffry [place] INJECTION PROTOCOL 2 each 2   famotidine (PEPCID) 20 MG tablet Take 1 tablet (20 mg total) by mouth 2 (two) times daily. 180 tablet 3   Ferrous Sulfate (IRON) 28 MG TABS Take 1 tablet (28 mg total) by mouth once a week.     furosemide (LASIX) 20 MG tablet Take 1 tablet (20 mg total) by mouth daily. 90 tablet 4    gabapentin (NEURONTIN) 300 MG capsule TAKE 1 CAPSULE BY MOUTH ONCE DAILY AS NEEDED FOR  NERVE  PAIN  (ITCHING) 90 capsule 3   hydrALAZINE (APRESOLINE) 25 MG tablet Take 1 tablet (25 mg total) by mouth in the morning and at bedtime. 180 tablet 3   levothyroxine (SYNTHROID) 100 MCG tablet Take 1 tablet (100 mcg total) by mouth daily before breakfast. 90 tablet 4   loperamide (IMODIUM) 2 MG capsule Take 1 capsule (2 mg total) by mouth 3 (three) times daily as needed for diarrhea or loose stools. 30 capsule 0   MAGNESIUM GLUCONATE PO Take by mouth at bedtime.     mometasone (ELOCON) 0.1 % ointment Apply topically daily. Shower than ointment applied while still wet daily 45 g 1   Multiple Vitamin (MULTIVITAMIN ADULT PO) Take by mouth daily.     polyethylene glycol (MIRALAX / GLYCOLAX) 17 g packet Take 17 g by mouth daily.     Simethicone 180 MG CAPS Take 1 capsule by mouth daily as needed (indigestion).     spironolactone (ALDACTONE) 25 MG tablet TAKE 1 AND 1/2 TABLETS DAILY 135 tablet 3   timolol (TIMOPTIC) 0.5 % ophthalmic solution Place 1 drop into both eyes every morning.     No current facility-administered medications on file prior to visit.    Allergies  Allergen Reactions  Ace Inhibitors Cough   Amlodipine Nausea Only and Other (See Comments)    Stomach pains--"feel like my insides are on fire"   Carvedilol Other (See Comments)    fatigue   Chlorthalidone Other (See Comments)    Severe hyponatremia   Montelukast Other (See Comments)    Slurred speech; really drowsy   Other Other (See Comments)    No seeds or corn because of IBS   Risedronate Sodium Other (See Comments)    REACTION: joint pain   Simvastatin Other (See Comments)    REACTION: the full 20 mg pill causes leg pain- can tol 10 mg   Prolia [Denosumab] Other (See Comments)    facial swelling, throat tightness, right greater than left foot swelling, right great toe numbness   Alendronate Sodium Palpitations    Influenza Vaccines Rash and Other (See Comments)    Led to mental breakdown in 1960s    Losartan Other (See Comments)    Cr bumped   Paroxetine Hcl Other (See Comments)    Not effective - felt ill on this medicine   Silenor [Doxepin Hcl] Palpitations    Hallucinations and racing heart   Sulfonamide Derivatives Rash   Toprol Xl [Metoprolol] Diarrhea    Diarrhea and weakness    Past Medical History:  Diagnosis Date   Anxiety    Basal cell carcinoma, arm, left 02/2023   L mid lateral posterior arm ([place])   C. difficile diarrhea 07/11/2018   S/p multiple oral vanc treatments (course and tapers) and completed Zinplava monoclonal Ab treatment (05/10/2019) Fecal transplant on hold during COVID pandemic   CAP (community acquired pneumonia) 12/22/2017   Carotid stenosis 10/18/2014   R 1-39%, L 40-59%, rpt 1 yr (09/2014)    CKD (chronic kidney disease) stage 3, GFR 30-59 ml/min (HCC) 08/02/2014   Closed fracture of right orbit (HCC) 02/10/2021   Clostridioides difficile infection    hx 2020   COVID-19 virus infection 10/13/2020   Degenerative disc disease    LS   Depression    nervous breakdonw in 1964-out of work for a year   Diverticulosis    severe by colonoscopy   Family history of adverse reaction to anesthesia    n/v   GERD (gastroesophageal reflux disease)    Glaucoma    Heart murmur 08/2011   mitral regurge - on echo    History of hiatal hernia    History of shingles    HTN (hypertension)    Hyperlipidemia    Hypertension    Hypothyroidism    IBS 11/02/2006   Influenza A 04/20/2022   Intestinal bacterial overgrowth    In small colon   Left shoulder pain 11/04/2014   Lower GI bleed 12/2020   thought diverticular complicated by ABLA with syncope and orbital fracture s/p hospitalization   Maxillary fracture (HCC) 04/08/2012   Osteoarthritis 2016   ([facility])   Osteoporosis    dexa 2011   Perirectal abscess 08/03/2021   CT guided aspiration of abscess grow  Proteums mirabilis and Citrobacter koseri 07/2021, currently receiving IV Zosyn planned total 6 wks.   Pseudogout 2016   shoulders ([place])   Strep pharyngitis 04/20/2022    Past Surgical History:  Procedure Laterality Date   barium enema  2012   severe diverticulosis, redundant colon   Bowel obstruction  1999   no surgery in hosp x 3 days   BUBBLE STUDY  07/27/2021   Procedure: BUBBLE STUDY;  Surgeon: Orpah Cobb, MD;  Location: [facility]  ENDOSCOPY;  Service: Cardiovascular;;   CARDIOVERSION N/A 11/19/2021   Procedure: CARDIOVERSION;  Surgeon: Debbe Odea, MD;  Location: [facility] ORS;  Service: Cardiovascular;  Laterality: N/A;   COLONOSCOPY  1. 1999  2. 11/04   1. Not finished  2. Slight hemorrhage rectosigmoid area, severe sig diverticulosis   COLONOSCOPY WITH PROPOFOL N/A 09/10/2019   SSP with dysplasia, TA, rpt 3 yrs Allegra Lai, Loel Dubonnet, MD)   Dexa  1. [phone]   2. 9/04  3. 3/08    1. OP  2. OP, borderline, spine -2.44T  3. decreased BMD-OP   DG KNEE 1-2 VIEWS BILAT     LS x-ray with degenerative disc and facet change   ESOPHAGOGASTRODUODENOSCOPY     Negative   FLEXIBLE SIGMOIDOSCOPY N/A 01/16/2021   Procedure: FLEXIBLE SIGMOIDOSCOPY;  Surgeon: Iva Boop, MD;  [facility] ENDOSCOPY;  Service: Endoscopy;  Laterality: N/A;  or unsedated   Hemorrhoid procedure  07/2006   PICC LINE REMOVAL ([facility] HX)  08/28/2021   PROCTOSCOPY N/A 05/26/2021   Procedure: RIGID PROCTOSCOPY;  Surgeon: Karie Soda, MD;  Location: [facility] ORS;  Service: General;  Laterality: N/A;   RECTOPEXY N/A 05/26/2021   Procedure: RECTOPEXY;  Surgeon: Karie Soda, MD;  Location: [facility] ORS;  Service: General;  Laterality: N/A;   TEE WITHOUT CARDIOVERSION N/A 07/27/2021   Procedure: TRANSESOPHAGEAL ECHOCARDIOGRAM (TEE);  Surgeon: Orpah Cobb, MD;  Location: [facility] ENDOSCOPY;  Service: Cardiovascular;  Laterality: N/A;   TONSILLECTOMY     TOTAL SHOULDER ARTHROPLASTY Left 11/27/2015   Christena Flake, MD   TOTAL  SHOULDER REVISION Left 03/16/2016   Procedure: TOTAL SHOULDER REVISION;  Surgeon: Christena Flake, MD;  Location: [facility] ORS;  Service: Orthopedics;  Laterality: Left;   US ECHOCARDIOGRAPHY  07/2011   Normal systolic fxn with EF 55-60%.  Focal basal septal hypertrophy.  Mild diastolic dysfunction.  Mild MR.   XI ROBOTIC ASSISTED LOWER ANTERIOR RESECTION N/A 05/26/2021   Procedure: ROBOTIC LOW ANTERIOR RECTOSIGMOID RESECTION; ASSESSMENT OF TISSUE PERFUSION WITH FIREFLY;  Surgeon: Karie Soda, MD;  Location: [facility] ORS;  Service: General;  Laterality: N/A;    Family History  Problem Relation Age of Onset   Diabetes Brother        post-op   Coronary artery disease Brother    Diabetes Paternal Grandfather    Breast cancer Cousin     Social History   Socioeconomic History   Marital status: Widowed    Spouse name: Not on file   Number of children: 2   Years of education: Not on file   Highest education level: 12th grade  Occupational History   Occupation: Retired    Associate Professor: RETIRED  Tobacco Use   Smoking status: Never   Smokeless tobacco: Never  Vaping Use   Vaping status: Never Used  Substance and Sexual Activity   Alcohol use: No   Drug use: No   Sexual activity: Not Currently  Other Topics Concern   Not on file  Social History Narrative   Left handed   Widow. Husband ([name]) deceased 2016/03/14 from dementia. She was caregiver.    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   Activity: no regular exercise   Diet: good water, fruits/vegetables daily      Patient Care Team:   Eustaquio Boyden, MD as PCP - General (Family Medicine)   Debbe Odea, MD as PCP - Cardiology (Cardiology)   George Ina  E, RN as [employer]ographer, Viviann Spare, MD as Consulting Physician (General Surgery)   Toney Reil, MD as Consulting Physician (Gastroenterology)   Debbe Odea, MD as  Consulting Physician (Cardiology)   Social Drivers of Health   Financial Resource Strain: Low Risk  (07/20/2023)   Overall Financial Resource Strain (CARDIA)    Difficulty of Paying Living Expenses: Not hard at all  Food Insecurity: No Food Insecurity (07/20/2023)   Hunger Vital Sign    Worried About Running Out of Food in the Last Year: Never true    Ran Out of Food in the Last Year: Never true  Transportation Needs: No Transportation Needs (07/20/2023)   PRAPARE - Administrator, Civil Service (Medical): No    Lack of Transportation (Non-Medical): No  Physical Activity: Inactive (07/20/2023)   Exercise Vital Sign    Days of Exercise per Week: 0 days    Minutes of Exercise per Session: 0 min  Stress: No Stress Concern Present (07/20/2023)   [employer] of Occupational Health - Occupational Stress Questionnaire    Feeling of Stress : Not at all  Social Connections: Socially Isolated (07/20/2023)   Social Connection and Isolation Panel [NHANES]    Frequency of Communication with Friends and Family: Three times a week    Frequency of Social Gatherings with Friends and Family: Once a week    Attends Religious Services: Never    Database administrator or Organizations: No    Attends Banker Meetings: Never    Marital Status: Widowed  Intimate Partner Violence: Not At Risk (07/20/2023)   Humiliation, Afraid, Rape, and Kick questionnaire    Fear of Current or Ex-Partner: No    Emotionally Abused: No    Physically Abused: No    Sexually Abused: No   Review of Systems Some nausea---but no vomiting Appetite fair--eating less some days     Objective:   Physical Exam Constitutional:      Appearance: Normal appearance.  HENT:     Head:     Comments: No sinus tenderness    Right Ear: Tympanic membrane and ear canal normal.     Left Ear: Tympanic membrane and ear canal normal.     Mouth/Throat:     Pharynx: No oropharyngeal exudate or posterior  oropharyngeal erythema.  Pulmonary:     Effort: Pulmonary effort is normal.     Breath sounds: Normal breath sounds. No wheezing or rales.  Musculoskeletal:     Cervical back: Neck supple.  Lymphadenopathy:     Cervical: No cervical adenopathy.  Neurological:     Mental Status: She is alert.            Assessment & Plan:

## 2023-07-28 NOTE — Assessment & Plan Note (Addendum)
 With 3 weeks of symptoms I suspect sinusitis--but will check CXR to be sure no pneumonia CXR shows some chronic interstitial changes but no pneumonia Will go ahead and treat with augmentin 875 bid x 7 days Analgesics prn

## 2023-08-01 ENCOUNTER — Encounter: Payer: Self-pay | Admitting: Internal Medicine

## 2023-08-14 ENCOUNTER — Other Ambulatory Visit: Payer: Self-pay | Admitting: Family Medicine

## 2023-08-14 DIAGNOSIS — N1832 Chronic kidney disease, stage 3b: Secondary | ICD-10-CM

## 2023-08-14 DIAGNOSIS — M810 Age-related osteoporosis without current pathological fracture: Secondary | ICD-10-CM

## 2023-08-14 DIAGNOSIS — R7303 Prediabetes: Secondary | ICD-10-CM

## 2023-08-14 DIAGNOSIS — D508 Other iron deficiency anemias: Secondary | ICD-10-CM

## 2023-08-14 DIAGNOSIS — E039 Hypothyroidism, unspecified: Secondary | ICD-10-CM

## 2023-08-15 ENCOUNTER — Other Ambulatory Visit (INDEPENDENT_AMBULATORY_CARE_PROVIDER_SITE_OTHER): Payer: PPO

## 2023-08-15 ENCOUNTER — Encounter: Payer: Self-pay | Admitting: Internal Medicine

## 2023-08-15 DIAGNOSIS — N1832 Chronic kidney disease, stage 3b: Secondary | ICD-10-CM | POA: Diagnosis not present

## 2023-08-15 DIAGNOSIS — M810 Age-related osteoporosis without current pathological fracture: Secondary | ICD-10-CM | POA: Diagnosis not present

## 2023-08-15 DIAGNOSIS — D508 Other iron deficiency anemias: Secondary | ICD-10-CM

## 2023-08-15 DIAGNOSIS — E039 Hypothyroidism, unspecified: Secondary | ICD-10-CM

## 2023-08-15 DIAGNOSIS — R7303 Prediabetes: Secondary | ICD-10-CM

## 2023-08-15 LAB — CBC WITH DIFFERENTIAL/PLATELET
Basophils Absolute: 0 10*3/uL (ref 0.0–0.1)
Basophils Relative: 0.4 % (ref 0.0–3.0)
Eosinophils Absolute: 0.4 10*3/uL (ref 0.0–0.7)
Eosinophils Relative: 6.3 % — ABNORMAL HIGH (ref 0.0–5.0)
HCT: 47.9 % — ABNORMAL HIGH (ref 36.0–46.0)
Hemoglobin: 16 g/dL — ABNORMAL HIGH (ref 12.0–15.0)
Lymphocytes Relative: 37.1 % (ref 12.0–46.0)
Lymphs Abs: 2.4 10*3/uL (ref 0.7–4.0)
MCHC: 33.4 g/dL (ref 30.0–36.0)
MCV: 98.2 fl (ref 78.0–100.0)
Monocytes Absolute: 0.9 10*3/uL (ref 0.1–1.0)
Monocytes Relative: 13.9 % — ABNORMAL HIGH (ref 3.0–12.0)
Neutro Abs: 2.8 10*3/uL (ref 1.4–7.7)
Neutrophils Relative %: 42.3 % — ABNORMAL LOW (ref 43.0–77.0)
Platelets: 232 10*3/uL (ref 150.0–400.0)
RBC: 4.88 Mil/uL (ref 3.87–5.11)
RDW: 14.1 % (ref 11.5–15.5)
WBC: 6.5 10*3/uL (ref 4.0–10.5)

## 2023-08-15 LAB — COMPREHENSIVE METABOLIC PANEL WITH GFR
ALT: 24 U/L (ref 0–35)
AST: 28 U/L (ref 0–37)
Albumin: 4.3 g/dL (ref 3.5–5.2)
Alkaline Phosphatase: 105 U/L (ref 39–117)
BUN: 19 mg/dL (ref 6–23)
CO2: 32 meq/L (ref 19–32)
Calcium: 9.8 mg/dL (ref 8.4–10.5)
Chloride: 97 meq/L (ref 96–112)
Creatinine, Ser: 1.12 mg/dL (ref 0.40–1.20)
GFR: 43.22 mL/min — ABNORMAL LOW (ref >60.00)
Glucose, Bld: 111 mg/dL — ABNORMAL HIGH (ref 70–99)
Potassium: 4.3 meq/L (ref 3.5–5.1)
Sodium: 137 meq/L (ref 135–145)
Total Bilirubin: 0.7 mg/dL (ref 0.2–1.2)
Total Protein: 6.8 g/dL (ref 6.0–8.3)

## 2023-08-15 LAB — TSH: TSH: 0.23 u[IU]/mL — ABNORMAL LOW (ref 0.35–5.50)

## 2023-08-15 LAB — IBC PANEL
Iron: 97 ug/dL (ref 42–145)
Saturation Ratios: 25.6 % (ref 20.0–50.0)
TIBC: 379.4 ug/dL (ref 250.0–450.0)
Transferrin: 271 mg/dL (ref 212.0–360.0)

## 2023-08-15 LAB — LIPID PANEL
Cholesterol: 182 mg/dL (ref 0–200)
HDL: 61.1 mg/dL (ref >39.00)
LDL Cholesterol: 98 mg/dL (ref 0–99)
NonHDL: 121.16
Total CHOL/HDL Ratio: 3
Triglycerides: 114 mg/dL (ref 0.0–149.0)
VLDL: 22.8 mg/dL (ref 0.0–40.0)

## 2023-08-15 LAB — MICROALBUMIN / CREATININE URINE RATIO
Creatinine,U: 74.8 mg/dL
Microalb Creat Ratio: 24.6 mg/g (ref 0.0–30.0)
Microalb, Ur: 1.8 mg/dL (ref 0.0–1.9)

## 2023-08-15 LAB — FERRITIN: Ferritin: 172.2 ng/mL (ref 10.0–291.0)

## 2023-08-15 LAB — PHOSPHORUS: Phosphorus: 3.9 mg/dL (ref 2.3–4.6)

## 2023-08-15 LAB — HEMOGLOBIN A1C: Hgb A1c MFr Bld: 6.2 % (ref 4.6–6.5)

## 2023-08-15 LAB — VITAMIN D 25 HYDROXY (VIT D DEFICIENCY, FRACTURES): VITD: 42.6 ng/mL (ref 30.00–100.00)

## 2023-08-16 LAB — PARATHYROID HORMONE, INTACT (NO CA): PTH: 19 pg/mL (ref 16–77)

## 2023-08-16 NOTE — Telephone Encounter (Signed)
 Patient is scheduled to see you tomorrow. She has a Furniture conservator/restorer, so she is not in [place]. Is this data sufficient or does she need to bring her machine in  when she comes?

## 2023-08-17 ENCOUNTER — Ambulatory Visit: Admitting: Internal Medicine

## 2023-08-17 ENCOUNTER — Encounter: Payer: Self-pay | Admitting: Internal Medicine

## 2023-08-17 VITALS — BP 120/80 | HR 85 | Temp 98.1°F | Ht 60.0 in | Wt 174.4 lb

## 2023-08-17 DIAGNOSIS — Z9981 Dependence on supplemental oxygen: Secondary | ICD-10-CM | POA: Diagnosis not present

## 2023-08-17 DIAGNOSIS — J449 Chronic obstructive pulmonary disease, unspecified: Secondary | ICD-10-CM

## 2023-08-17 DIAGNOSIS — J9611 Chronic respiratory failure with hypoxia: Secondary | ICD-10-CM

## 2023-08-17 DIAGNOSIS — G4733 Obstructive sleep apnea (adult) (pediatric): Secondary | ICD-10-CM | POA: Diagnosis not present

## 2023-08-17 NOTE — Patient Instructions (Signed)
 Continue your BiPAP as prescribed  Recommend Referral to Dr. Kieran Pellet Sleep Certified  Doctor  Avoid Allergens and Irritants Avoid secondhand smoke Avoid SICK contacts Recommend  Masking  when appropriate Recommend Keep up-to-date with vaccinations

## 2023-08-17 NOTE — Progress Notes (Unsigned)
 @Patient  ID: Brooke Beasley, female    DOB: [birth year], 89 y.o.   MRN: [id number]  Synopsis 89 year old female seen for sleep consult November 13, 2021 for nocturnal hypoxemia and witnessed apneic events.  Found to have mild obstructive sleep apnea hospitalization May 2023 for L5-S1 discitis/epidural abscess (Proteus/Citrobacter) requiring prolonged antibiotics Medical history significant for C. difficile, A-fib, congestive heart failure, aortic valve stenosis and scoliosis   TEST/EVENTS :  3/31, follow-up MRI with contrast showed a 4 mm epidural phlegmon without drainable component. 3/31, underwent disc aspiration that grew Enterococcus.  Following CT scan showed decrease in the size of seroma. 4/2, started endocarditis coverage with Unasyn  4/3, TEE negative for endocarditis antibiotics adjusted by ID, PICC line placed by vascular access team 4/7, MRI lumbar and sacral spine were repeated because of persistent pain and elevated inflammatory markers.  8 showed continued discitis osteomyelitis and L5-S1 level.  It also showed 6 cm size collection posterior to the rectum likely an abscess. 4/10: CT-guided aspiration of pelvic fluid collection posterior to the rectum.   3/30-4/13>> hospitalization for L5-S1 discitis/abscess posterior to rectum-cultures Proteus/Citrobacter-discharged on IV Zosyn -stop date 5/22.  New onset A-fib-started on anticoagulation. 5/03>> outpatient MRI-resolution of L5-S1 epidural abscess, persistent L5-S1 discitis/osteomyelitis. 5/11>> due to persistent leukocytosis-intermittent fevers-PICC line removed on 5/5-transition to Augmentin  by ID in the outpatient setting. 5/14>> admit for hyponatremia. 5/15>> no response to IVF-renal consult-to ICU for 3% NaCl.   5/17 >> transfer back to [facility]-off 3% NaCl since 5/16.   11/2021 Split study -during baseline, RDI was 15/hour-CPAP titrated to 11 cm Since she already has BiPAP, can be set at 11/8 cm   BiPAP shows excellent  compliance with daily average usage at 10 hours.  AHI 10.6.  Patient's pressures are at 16/8.  Patient thinks that her granddaughter may have been pushing buttons on her machine.    Chief complaint Follow-up assessment for sleep apnea   HPI Follow-up assessment for OSA Patient with critical illness for prolonged hospitalization in May 2023 Hypoxia events were noted Started on Luna BiPAP at discharge Set up for split-night sleep study completed August 2023 Optimal control 11 cm of CPAP however, since she was already on Luna BiPAP her pressures were set to 11/8   Says overall she has been doing okay.  No exacerbation at this time No evidence of heart failure at this time No evidence or signs of infection at this time No respiratory distress No fevers, chills, nausea, vomiting, diarrhea No evidence of lower extremity edema No evidence hemoptysis   Patient with residual AHI of 20 I have explained to patient and daughter that she would likely need to be seen by sleep certified physician for further assessment   Allergies  Allergen Reactions   Ace Inhibitors Cough   Amlodipine  Nausea Only and Other (See Comments)    Stomach pains--"feel like my insides are on fire"   Carvedilol  Other (See Comments)    fatigue   Chlorthalidone  Other (See Comments)    Severe hyponatremia   Montelukast  Other (See Comments)    Slurred speech; really drowsy   Other Other (See Comments)    No seeds or corn because of IBS   Risedronate Sodium Other (See Comments)    REACTION: joint pain   Simvastatin  Other (See Comments)    REACTION: the full 20 mg pill causes leg pain- can tol 10 mg   Prolia  [Denosumab ] Other (See Comments)    facial swelling, throat tightness, right greater than left foot swelling,  right great toe numbness   Alendronate Sodium Palpitations   Influenza Vaccines Rash and Other (See Comments)    Led to mental breakdown in 1960s    Losartan  Other (See Comments)    Cr bumped    Paroxetine Hcl Other (See Comments)    Not effective - felt ill on this medicine   Silenor  [Doxepin  Hcl] Palpitations    Hallucinations and racing heart   Sulfonamide Derivatives Rash   Toprol  Xl [Metoprolol ] Diarrhea    Diarrhea and weakness    Immunization History  Administered Date(s) Administered   PFIZER(Purple Top)SARS-COV-2 Vaccination 08/10/2019, 08/31/2019   Pneumococcal Conjugate-13 06/08/2013   Pneumococcal Polysaccharide-23 04/26/2002   Td 04/26/1998, 10/22/2008   Zoster, Live 05/11/2010    Past Medical History:  Diagnosis Date   Anxiety    Basal cell carcinoma, arm, left 02/2023   L mid lateral posterior arm ([place])   C. difficile diarrhea 07/11/2018   S/p multiple oral vanc treatments (course and tapers) and completed Zinplava  monoclonal Ab treatment (05/10/2019) Fecal transplant on hold during COVID pandemic   CAP (community acquired pneumonia) 12/22/2017   Carotid stenosis 10/18/2014   R 1-39%, L 40-59%, rpt 1 yr (09/2014)    CKD (chronic kidney disease) stage 3, GFR 30-59 ml/min (HCC) 08/02/2014   Closed fracture of right orbit (HCC) 02/10/2021   Clostridioides difficile infection    hx 2020   COVID-19 virus infection 10/13/2020   Degenerative disc disease    LS   Depression    nervous breakdonw in 1964-out of work for a year   Diverticulosis    severe by colonoscopy   Family history of adverse reaction to anesthesia    n/v   GERD (gastroesophageal reflux disease)    Glaucoma    Heart murmur 08/2011   mitral regurge - on echo    History of hiatal hernia    History of shingles    HTN (hypertension)    Hyperlipidemia    Hypertension    Hypothyroidism    IBS 11/02/2006   Influenza A 04/20/2022   Intestinal bacterial overgrowth    In small colon   Left shoulder pain 11/04/2014   Lower GI bleed 12/2020   thought diverticular complicated by ABLA with syncope and orbital fracture s/p hospitalization   Maxillary fracture (HCC) 04/08/2012    Osteoarthritis 2016   ([facility])   Osteoporosis    dexa 2011   Perirectal abscess 08/03/2021   CT guided aspiration of abscess grow Proteums mirabilis and Citrobacter koseri 07/2021, currently receiving IV Zosyn  planned total 6 wks.   Pseudogout 2016   shoulders ([place])   Strep pharyngitis 04/20/2022    Tobacco History: Social History   Tobacco Use  Smoking Status Never  Smokeless Tobacco Never   Counseling given: Not Answered   Outpatient Medications Prior to Visit  Medication Sig Dispense Refill   apixaban  (ELIQUIS ) 5 MG TABS tablet Take 1 tablet (5 mg total) by mouth 2 (two) times daily. 60 tablet 11   Ascorbic Acid  (VITAMIN C PO) Take by mouth.     bimatoprost (LUMIGAN) 0.01 % SOLN Place 1 drop into both eyes at bedtime.     carvedilol  (COREG ) 3.125 MG tablet Take 1 tablet (3.125 mg total) by mouth 2 (two) times daily with a meal. 180 tablet 3   cetirizine  (EQ ALLERGY  RELIEF, CETIRIZINE ,) 10 MG tablet Take 1 tablet (10 mg total) by mouth daily. 90 tablet 0   cholecalciferol  (VITAMIN D ) 1000 units tablet Take 1,000 Units by  mouth daily.     clonazePAM  (KLONOPIN ) 0.5 MG tablet TAKE 1 TABLET BY MOUTH AT BEDTIME 30 tablet 0   colchicine  0.6 MG tablet Take 1 tablet (0.6 mg total) by mouth daily. 90 tablet 1   diclofenac  Sodium (VOLTAREN ) 1 % GEL Apply 2 g topically 4 (four) times daily.     DULoxetine  (CYMBALTA ) 20 MG capsule Take 1 capsule (20 mg total) by mouth daily. 90 capsule 4   EPINEPHrine  (EPIPEN  2-PAK) 0.3 mg/0.3 mL IJ SOAJ injection Inject 0.3 mg into the muscle as needed for anaphylaxis. XOLAIR  INJECTION PROTOCOL 2 each 2   famotidine  (PEPCID ) 20 MG tablet Take 1 tablet (20 mg total) by mouth 2 (two) times daily. 180 tablet 3   Ferrous Sulfate  (IRON ) 28 MG TABS Take 1 tablet (28 mg total) by mouth once a week.     furosemide  (LASIX ) 20 MG tablet Take 1 tablet (20 mg total) by mouth daily. 90 tablet 4   gabapentin  (NEURONTIN ) 300 MG capsule TAKE 1 CAPSULE BY MOUTH ONCE  DAILY AS NEEDED FOR  NERVE  PAIN  (ITCHING) 90 capsule 3   hydrALAZINE  (APRESOLINE ) 25 MG tablet Take 1 tablet (25 mg total) by mouth in the morning and at bedtime. 180 tablet 3   levothyroxine  (SYNTHROID ) 100 MCG tablet Take 1 tablet (100 mcg total) by mouth daily before breakfast. 90 tablet 4   loperamide  (IMODIUM ) 2 MG capsule Take 1 capsule (2 mg total) by mouth 3 (three) times daily as needed for diarrhea or loose stools. 30 capsule 0   MAGNESIUM  GLUCONATE PO Take by mouth at bedtime.     mometasone  (ELOCON ) 0.1 % ointment Apply topically daily. Shower than ointment applied while still wet daily 45 g 1   Multiple Vitamin (MULTIVITAMIN ADULT PO) Take by mouth daily.     polyethylene glycol (MIRALAX  / GLYCOLAX ) 17 g packet Take 17 g by mouth daily.     Simethicone  180 MG CAPS Take 1 capsule by mouth daily as needed (indigestion).     spironolactone  (ALDACTONE ) 25 MG tablet TAKE 1 AND 1/2 TABLETS DAILY 135 tablet 3   timolol (TIMOPTIC) 0.5 % ophthalmic solution Place 1 drop into both eyes every morning.     amoxicillin -clavulanate (AUGMENTIN ) 875-125 MG tablet Take 1 tablet by mouth 2 (two) times daily. (Patient not taking: Reported on 08/17/2023) 14 tablet 0   No facility-administered medications prior to visit.     BP 120/80 (BP Location: Right Arm, Patient Position: Sitting, Cuff Size: Normal)   Pulse 85   Temp 98.1 F (36.7 C) (Oral)   Ht 5' (1.524 m)   Wt 174 lb 6.4 oz (79.1 kg)   SpO2 92%   BMI 34.06 kg/m       Review of Systems: Gen:  Denies  fever, sweats, chills weight loss  HEENT: Denies blurred vision, double vision, ear pain, eye pain, hearing loss, nose bleeds, sore throat Cardiac:  No dizziness, chest pain or heaviness, chest tightness,edema, No JVD Resp:   No cough, -sputum production, -shortness of breath,-wheezing, -hemoptysis,  Other:  All other systems negative   Physical Examination:   General Appearance: No distress  EYES PERRLA, EOM intact.   NECK  Supple, No JVD Pulmonary: normal breath sounds, No wheezing.  CardiovascularNormal S1,S2.  No m/r/g.   Abdomen: Benign, Soft, non-tender. Neurology UE/LE 5/5 strength, no focal deficits Ext pulses intact, cap refill intact ALL OTHER ROS ARE NEGATIVE           Latest  Ref Rng & Units 09/14/2021    2:00 PM  PFT Results  FVC-Pre L 1.87   FVC-Predicted Pre % 98   Pre FEV1/FVC % % 72   FEV1-Pre L 1.34   FEV1-Predicted Pre % 97        Assessment & Plan:   89 year old female seen today for follow-up assessment for severe obstructive sleep apnea    Assessment of Sleep apnea Discussed in detail with patient We will need to obtain download report, granddaughter to fax us  copy I believe that patient will need further assessment with Dr Kieran Pellet sleep certified doctor for complex sleep apnea   Patient Instructions Continue to use CPAP every night, minimum of 4-6 hours a night.  Change equipment every 30 days or as directed by DME.  Wash your tubing with warm soap and water daily, hang to dry. Wash humidifier portion weekly. Use bottled, distilled water and change daily   Be aware of reduced alertness and do not drive or operate heavy machinery if experiencing this or drowsiness.  Exercise encouraged, as tolerated. Encouraged proper weight management.  Important to get eight or more hours of sleep  Limiting the use of the computer and television before bedtime.  Decrease naps during the day, so night time sleep will become enhanced.  Limit caffeine, and sleep deprivation.  HTN, stroke, uncontrolled diabetes and heart failure are potential risk factors.  Risk of untreated sleep apnea including cardiac arrhthymias, stroke, DM, pulm HTN.   Chronic Hypoxic resp failure due to COPD -Patient benefits from oxygen  therapy 2L [place]  -recommend using oxygen  as prescribed -patient needs this for survival    MEDICATION ADJUSTMENTS/LABS AND TESTS ORDERED: Continue BiPAP as  prescribed Recommend seeing Dr Kieran Pellet Avoid Allergens and Irritants Avoid secondhand smoke Avoid SICK contacts Recommend  Masking  when appropriate Recommend Keep up-to-date with vaccinations   CURRENT MEDICATIONS REVIEWED AT LENGTH WITH PATIENT TODAY   Patient  satisfied with Plan of action and management. All questions answered   Follow up 4 weeks  I spent a total of 45  minutes reviewing chart data, face-to-face evaluation with the patient, counseling and coordination of care as detailed above.      Lady Pier, M.D.  [facility]  Medical Director [facility] Medical Director [facility] Cardio-P[facility] Department

## 2023-08-20 ENCOUNTER — Other Ambulatory Visit: Payer: Self-pay | Admitting: Family Medicine

## 2023-08-20 DIAGNOSIS — F418 Other specified anxiety disorders: Secondary | ICD-10-CM

## 2023-08-22 ENCOUNTER — Ambulatory Visit (INDEPENDENT_AMBULATORY_CARE_PROVIDER_SITE_OTHER): Payer: PPO | Admitting: Family Medicine

## 2023-08-22 ENCOUNTER — Encounter: Payer: Self-pay | Admitting: Family Medicine

## 2023-08-22 VITALS — BP 134/84 | HR 85 | Temp 97.9°F | Ht 60.0 in | Wt 173.2 lb

## 2023-08-22 DIAGNOSIS — E039 Hypothyroidism, unspecified: Secondary | ICD-10-CM

## 2023-08-22 DIAGNOSIS — N1832 Chronic kidney disease, stage 3b: Secondary | ICD-10-CM

## 2023-08-22 DIAGNOSIS — Z7189 Other specified counseling: Secondary | ICD-10-CM

## 2023-08-22 DIAGNOSIS — H9193 Unspecified hearing loss, bilateral: Secondary | ICD-10-CM

## 2023-08-22 DIAGNOSIS — Z0000 Encounter for general adult medical examination without abnormal findings: Secondary | ICD-10-CM

## 2023-08-22 DIAGNOSIS — I10 Essential (primary) hypertension: Secondary | ICD-10-CM

## 2023-08-22 DIAGNOSIS — D508 Other iron deficiency anemias: Secondary | ICD-10-CM

## 2023-08-22 DIAGNOSIS — D751 Secondary polycythemia: Secondary | ICD-10-CM | POA: Diagnosis not present

## 2023-08-22 DIAGNOSIS — R0609 Other forms of dyspnea: Secondary | ICD-10-CM

## 2023-08-22 DIAGNOSIS — K219 Gastro-esophageal reflux disease without esophagitis: Secondary | ICD-10-CM

## 2023-08-22 DIAGNOSIS — L299 Pruritus, unspecified: Secondary | ICD-10-CM

## 2023-08-22 DIAGNOSIS — I4819 Other persistent atrial fibrillation: Secondary | ICD-10-CM | POA: Diagnosis not present

## 2023-08-22 DIAGNOSIS — M1120 Other chondrocalcinosis, unspecified site: Secondary | ICD-10-CM | POA: Diagnosis not present

## 2023-08-22 DIAGNOSIS — F418 Other specified anxiety disorders: Secondary | ICD-10-CM

## 2023-08-22 DIAGNOSIS — G4733 Obstructive sleep apnea (adult) (pediatric): Secondary | ICD-10-CM

## 2023-08-22 DIAGNOSIS — R7303 Prediabetes: Secondary | ICD-10-CM

## 2023-08-22 DIAGNOSIS — I4892 Unspecified atrial flutter: Secondary | ICD-10-CM

## 2023-08-22 DIAGNOSIS — H401190 Primary open-angle glaucoma, unspecified eye, stage unspecified: Secondary | ICD-10-CM

## 2023-08-22 DIAGNOSIS — M810 Age-related osteoporosis without current pathological fracture: Secondary | ICD-10-CM

## 2023-08-22 MED ORDER — FUROSEMIDE 20 MG PO TABS: 20.0000 mg | ORAL_TABLET | Freq: Every day | ORAL | 4 refills | Status: AC

## 2023-08-22 MED ORDER — MAGNESIUM 250 MG PO CAPS: 1.0000 | ORAL_CAPSULE | Freq: Every evening | ORAL | Status: AC

## 2023-08-22 MED ORDER — LEVOTHYROXINE SODIUM 100 MCG PO TABS: 100.0000 ug | ORAL_TABLET | Freq: Every day | ORAL | 3 refills | Status: DC

## 2023-08-22 MED ORDER — LEVOTHYROXINE SODIUM 100 MCG PO TABS: 100.0000 ug | ORAL_TABLET | Freq: Every day | ORAL | 4 refills | Status: DC

## 2023-08-22 MED ORDER — APIXABAN 5 MG PO TABS
5.0000 mg | ORAL_TABLET | Freq: Two times a day (BID) | ORAL | 3 refills | Status: DC
Start: 2023-08-22 — End: 2024-03-14

## 2023-08-22 NOTE — Assessment & Plan Note (Signed)
 Desires to defer repeat DEXA scan at this time.

## 2023-08-22 NOTE — Assessment & Plan Note (Signed)
 This is largely resolved on once weekly iron  - will trial off oral iron  replacement.

## 2023-08-22 NOTE — Assessment & Plan Note (Signed)
Wears hearing aides 

## 2023-08-22 NOTE — Assessment & Plan Note (Signed)
 Reviewed limiting added sugar in diet.

## 2023-08-22 NOTE — Assessment & Plan Note (Addendum)
 Recurrent, mild.  ?osa related.  Will stop oral iron .

## 2023-08-22 NOTE — Assessment & Plan Note (Signed)
 Chronic, difficult to control, doing well on current regimen - continue this.

## 2023-08-22 NOTE — Assessment & Plan Note (Signed)
 Previously discussed.

## 2023-08-22 NOTE — Assessment & Plan Note (Signed)
 Sees rheum on colchicine  daily with PRN BID dosing

## 2023-08-22 NOTE — Assessment & Plan Note (Signed)
 Chronic, stable period on cymbalta - continue.

## 2023-08-22 NOTE — Assessment & Plan Note (Signed)
Appreciate eye doctor care.

## 2023-08-22 NOTE — Patient Instructions (Addendum)
 Drop levothyroxine  dose to 100mcg daily, one day a week taking only 1/2 tablet.  Ok to hold oral iron  at this time.  Good to see you today  Return as needed or in 6 months for follow up visit, prior for fasting labs

## 2023-08-22 NOTE — Assessment & Plan Note (Signed)
 Preventative protocols reviewed and updated unless pt declined. Discussed healthy diet and lifestyle.

## 2023-08-22 NOTE — Assessment & Plan Note (Signed)
 Eliquis refilled.

## 2023-08-22 NOTE — Assessment & Plan Note (Signed)
-   Continue lasix 20mg daily

## 2023-08-22 NOTE — Assessment & Plan Note (Signed)
 Appreciate pulm care, overall doing well on nightly BiPAP

## 2023-08-22 NOTE — Assessment & Plan Note (Signed)
 Chronic, continue eliquis .

## 2023-08-22 NOTE — Assessment & Plan Note (Signed)
 TSH low suggesting overtreated thyroid .  Will drop levothyroxine  dose to 100mcg daily, once weekly taking 1/2 tab (50mcg)

## 2023-08-22 NOTE — Progress Notes (Signed)
 Ph: [phone] Fax: [phone]   Patient ID: Brooke Beasley, female    DOB: 1931/02/08, 89 y.o.   MRN: [id number]  This visit was conducted in person.  BP 134/84   Pulse 85   Temp 97.9 F (36.6 C) (Oral)   Ht 5' (1.524 m)   Wt 173 lb 4 oz (78.6 kg)   SpO2 95%   BMI 33.84 kg/m    CC: CPE/AMW Subjective:   HPI: Brooke Beasley is a 89 y.o. female presenting on 08/22/2023 for Annual Exam (MCR prt 2 [AWV- 07/20/23]. Pt accompanied by daughter, Ivette Marks.)   Saw health advisor 06/2023 for medicare wellness visit. Note reviewed.   No results found.  Flowsheet Row Office Visit from 08/22/2023 in [facility] [place]  PHQ-2 Total Score 1          08/22/2023    3:46 PM 07/20/2023    1:39 PM 08/13/2022    3:59 PM 06/16/2022    9:18 AM 10/28/2021    4:00 PM  Fall Risk   Falls in the past year? 1 1 1 1 1   Number falls in past yr: 0 0 0 0 1  Injury with Fall? 0 0  1 1  Risk for fall due to :  Impaired balance/gait;Impaired mobility;History of fall(s)     Follow up  Falls prevention discussed   Falls evaluation completed    OSA on BiPAP followed by Dr Auston Left. Last seen last week, discussing eval by Dr Kieran Pellet for complex sleep apnea in [facility] - upcoming appt Thursday.   Mood - SSRI stopped due to hyponatremia. Now on SNRI cymbalta  20mg  daily - she feels this has helped. Cymbalta  chosen as least likely to cause hyponatremia.    Pruritus with idiopathic urticaria - followed by allergist Dr Jerelene Monday on omalizumab  injection with benefit (recently decreased to 150mg  Q4 wks, continues zyrtec  10mg  bid and pepcid  20mg  bid, as well as mometasone  0.1% oint after shower. Upcoming appt next week. She continues itching but no longer rash, overall better. She uses dermacloud topically for this.   Sees Dr Theodis Fiscal cardiology 07/04/23 for difficult to control hypertension - continues carvedilol  3.125mg  bid, lasix  20mg  daily, hydralazine  25mg  bid, and  spironolactone  37.5mg  daily   Pseudogout followed by rheumatology - she is taking colchicine  0.6mg  daily with option to take BID if needed  Preventative: Colonoscopy 08/2019 - 2 SSP, TA, hemorrhoids, diverticulosis, rectal prolapse ([place]) Well woman - last done about 12 yrs ago. Aged out.  Mammogram 09/2022 - Birads1 @ [facility]  Lung cancer screen - not eligible  DEXA - 2011 osteoporosis. Pt does not take calcium  (causes constipation) but does take vit D. Fosamax caused bone pain. prolia  started 04/2019, latest shot 11/08/2019. Didn't take 2022 due to cost.  DEXA forearm T -4.2, hip -1.4 (01/2018)  DEXA 07/2020 - T -4.3 at forearm, -1.3 at L femoral neck - desires to postpone for now.  Flu shot - declines - nervous breakdown after flu shot remotely.  COVID vaccine Pfizer 07/2019, 08/2019, no booster Pneumovax 2004.  Prevnar-13 05/2013.  Td 2010  Zostavax 2012  Shingrix - discussed, hesitant due to itching  Advanced directives - scanned into chart 10/2015. Daughter [name] then Gerard Knight are HCPOA. Does not want life sustaining measures if terminally ill.  Seat belt use discussed  Sunscreen use discussed. No changing moles on skin. Sees derm yearly.  Non smoker Alcohol - none  Dentist - does  not see, has full dentures Eye exam - Q6 mo - glaucoma  Bowel - chronic constipation managed with miralax   Bladder - intermittent leaking, worse with diuretic   Left handed Widow. Husband ([name]) deceased 2016/03/06 from dementia. She was caregiver.  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 Interim medical history since our last visit reviewed. Allergies and medications reviewed and updated. Outpatient Medications Prior to Visit  Medication Sig  Dispense Refill   Ascorbic Acid  (VITAMIN C PO) Take by mouth.     bimatoprost (LUMIGAN) 0.01 % SOLN Place 1 drop into both eyes at bedtime.     carvedilol  (COREG ) 3.125 MG tablet Take 1 tablet (3.125 mg total) by mouth 2 (two) times daily with a meal. 180 tablet 3   cetirizine  (EQ ALLERGY  RELIEF, CETIRIZINE ,) 10 MG tablet Take 1 tablet (10 mg total) by mouth daily. 90 tablet 0   cholecalciferol  (VITAMIN D ) 1000 units tablet Take 1,000 Units by mouth daily.     clonazePAM  (KLONOPIN ) 0.5 MG tablet TAKE 1 TABLET BY MOUTH AT BEDTIME 30 tablet 0   colchicine  0.6 MG tablet Take 1 tablet (0.6 mg total) by mouth daily. 90 tablet 1   diclofenac  Sodium (VOLTAREN ) 1 % GEL Apply 2 g topically 4 (four) times daily.     DULoxetine  (CYMBALTA ) 20 MG capsule Take 1 capsule (20 mg total) by mouth daily. 90 capsule 4   EPINEPHrine  (EPIPEN  2-PAK) 0.3 mg/0.3 mL IJ SOAJ injection Inject 0.3 mg into the muscle as needed for anaphylaxis. XOLAIR  INJECTION PROTOCOL 2 each 2   famotidine  (PEPCID ) 20 MG tablet Take 1 tablet (20 mg total) by mouth 2 (two) times daily. 180 tablet 3   gabapentin  (NEURONTIN ) 300 MG capsule TAKE 1 CAPSULE BY MOUTH ONCE DAILY AS NEEDED FOR  NERVE  PAIN  (ITCHING) 90 capsule 3   hydrALAZINE  (APRESOLINE ) 25 MG tablet Take 1 tablet (25 mg total) by mouth in the morning and at bedtime. 180 tablet 3   mometasone  (ELOCON ) 0.1 % ointment Apply topically daily. Shower than ointment applied while still wet daily 45 g 1   Multiple Vitamin (MULTIVITAMIN ADULT PO) Take by mouth daily.     polyethylene glycol (MIRALAX  / GLYCOLAX ) 17 g packet Take 17 g by mouth daily.     Simethicone  180 MG CAPS Take 1 capsule by mouth daily as needed (indigestion).     spironolactone  (ALDACTONE ) 25 MG tablet TAKE 1 AND 1/2 TABLETS DAILY 135 tablet 3   timolol (TIMOPTIC) 0.5 % ophthalmic solution Place 1 drop into both eyes every morning.     apixaban  (ELIQUIS ) 5 MG TABS tablet Take 1 tablet (5 mg total) by mouth 2 (two) times  daily. 60 tablet 11   Ferrous Sulfate  (IRON ) 28 MG TABS Take 1 tablet (28 mg total) by mouth once a week.     furosemide  (LASIX ) 20 MG tablet Take 1 tablet (20 mg total) by mouth daily. 90 tablet 4   levothyroxine  (SYNTHROID ) 100 MCG tablet Take 1 tablet (100 mcg total) by mouth daily before breakfast. 90 tablet 4   loperamide  (IMODIUM ) 2 MG capsule Take 1 capsule (2 mg total) by mouth 3 (three) times daily as needed for diarrhea or loose stools. 30 capsule 0   MAGNESIUM  GLUCONATE PO Take  by mouth at bedtime.     amoxicillin -clavulanate (AUGMENTIN ) 875-125 MG tablet Take 1 tablet by mouth 2 (two) times daily. (Patient not taking: Reported on 08/17/2023) 14 tablet 0   No facility-administered medications prior to visit.     Per HPI unless specifically indicated in ROS section below Review of Systems  Constitutional:  Positive for fatigue. Negative for activity change, appetite change, chills, fever and unexpected weight change.  HENT:  Negative for hearing loss.   Eyes:  Negative for visual disturbance.  Respiratory:  Positive for cough. Negative for chest tightness, shortness of breath and wheezing.   Cardiovascular:  Negative for chest pain, palpitations and leg swelling.  Gastrointestinal:  Positive for constipation. Negative for abdominal distention, abdominal pain, blood in stool, diarrhea, nausea and vomiting.  Genitourinary:  Negative for difficulty urinating and hematuria.  Musculoskeletal:  Positive for arthralgias. Negative for myalgias and neck pain.  Skin:  Negative for rash.  Neurological:  Negative for dizziness, seizures, syncope and headaches.  Hematological:  Negative for adenopathy. Does not bruise/bleed easily.  Psychiatric/Behavioral:  Negative for dysphoric mood. The patient is not nervous/anxious.     Objective:  BP 134/84   Pulse 85   Temp 97.9 F (36.6 C) (Oral)   Ht 5' (1.524 m)   Wt 173 lb 4 oz (78.6 kg)   SpO2 95%   BMI 33.84 kg/m   Wt Readings from Last  3 Encounters:  08/22/23 173 lb 4 oz (78.6 kg)  08/17/23 174 lb 6.4 oz (79.1 kg)  07/28/23 173 lb (78.5 kg)      Physical Exam Vitals and nursing note reviewed.  Constitutional:      Appearance: Normal appearance. She is not ill-appearing.  HENT:     Head: Normocephalic and atraumatic.     Right Ear: Tympanic membrane, ear canal and external ear normal. There is no impacted cerumen.     Left Ear: Tympanic membrane, ear canal and external ear normal. There is no impacted cerumen.     Mouth/Throat:     Mouth: Mucous membranes are moist.     Pharynx: Oropharynx is clear. No oropharyngeal exudate or posterior oropharyngeal erythema.  Eyes:     General:        Right eye: No discharge.        Left eye: No discharge.     Extraocular Movements: Extraocular movements intact.     Conjunctiva/sclera: Conjunctivae normal.     Pupils: Pupils are equal, round, and reactive to light.  Neck:     Thyroid : No thyroid  mass or thyromegaly.     Vascular: Carotid bruit (referred from cardiac murmur) present.  Cardiovascular:     Rate and Rhythm: Normal rate and regular rhythm.     Pulses: Normal pulses.     Heart sounds: Murmur (3/6 systolic) heard.  Pulmonary:     Effort: Pulmonary effort is normal. No respiratory distress.     Breath sounds: Normal breath sounds. No wheezing, rhonchi or rales.  Abdominal:     General: Bowel sounds are normal. There is no distension.     Palpations: Abdomen is soft. There is no mass.     Tenderness: There is no abdominal tenderness. There is no guarding or rebound.     Hernia: No hernia is present.  Musculoskeletal:     Cervical back: Normal range of motion and neck supple. No rigidity.     Right lower leg: No edema.     Left lower leg: No edema.  Lymphadenopathy:     Cervical: No cervical adenopathy.  Skin:    General: Skin is warm and dry.     Findings: No rash.  Neurological:     General: No focal deficit present.     Mental Status: She is alert.  Mental status is at baseline.  Psychiatric:        Mood and Affect: Mood normal.        Behavior: Behavior normal.       Results for orders placed or performed in visit on 08/15/23  IBC panel   Collection Time: 08/15/23  7:54 AM  Result Value Ref Range   Iron  97 42 - 145 ug/dL   Transferrin 960.4 540.9 - 360.0 mg/dL   Saturation Ratios 81.1 20.0 - 50.0 %   TIBC 379.4 250.0 - 450.0 mcg/dL  Ferritin   Collection Time: 08/15/23  7:54 AM  Result Value Ref Range   Ferritin 172.2 10.0 - 291.0 ng/mL  CBC with Differential/Platelet   Collection Time: 08/15/23  7:54 AM  Result Value Ref Range   WBC 6.5 4.0 - 10.5 K/uL   RBC 4.88 3.87 - 5.11 Mil/uL   Hemoglobin 16.0 (H) 12.0 - 15.0 g/dL   HCT 91.4 (H) 78.2 - 95.6 %   MCV 98.2 78.0 - 100.0 fl   MCHC 33.4 30.0 - 36.0 g/dL   RDW 21.3 08.6 - 57.8 %   Platelets 232.0 150.0 - 400.0 K/uL   Neutrophils Relative % 42.3 (L) 43.0 - 77.0 %   Lymphocytes Relative 37.1 12.0 - 46.0 %   Monocytes Relative 13.9 (H) 3.0 - 12.0 %   Eosinophils Relative 6.3 (H) 0.0 - 5.0 %   Basophils Relative 0.4 0.0 - 3.0 %   Neutro Abs 2.8 1.4 - 7.7 K/uL   Lymphs Abs 2.4 0.7 - 4.0 K/uL   Monocytes Absolute 0.9 0.1 - 1.0 K/uL   Eosinophils Absolute 0.4 0.0 - 0.7 K/uL   Basophils Absolute 0.0 0.0 - 0.1 K/uL  Parathyroid  hormone, intact (no Ca)   Collection Time: 08/15/23  7:54 AM  Result Value Ref Range   PTH 19 16 - 77 pg/mL  Microalbumin / creatinine urine ratio   Collection Time: 08/15/23  7:54 AM  Result Value Ref Range   Microalb, Ur 1.8 0.0 - 1.9 mg/dL   Creatinine,U 46.9 mg/dL   Microalb Creat Ratio 24.6 0.0 - 30.0 mg/g  VITAMIN D  25 Hydroxy (Vit-D Deficiency, Fractures)   Collection Time: 08/15/23  7:54 AM  Result Value Ref Range   VITD 42.60 30.00 - 100.00 ng/mL  Phosphorus   Collection Time: 08/15/23  7:54 AM  Result Value Ref Range   Phosphorus 3.9 2.3 - 4.6 mg/dL  Comprehensive metabolic panel with GFR   Collection Time: 08/15/23  7:54 AM   Result Value Ref Range   Sodium 137 135 - 145 mEq/L   Potassium 4.3 3.5 - 5.1 mEq/L   Chloride 97 96 - 112 mEq/L   CO2 32 19 - 32 mEq/L   Glucose, Bld 111 (H) 70 - 99 mg/dL   BUN 19 6 - 23 mg/dL   Creatinine, Ser 6.29 0.40 - 1.20 mg/dL   Total Bilirubin 0.7 0.2 - 1.2 mg/dL   Alkaline Phosphatase 105 39 - 117 U/L   AST 28 0 - 37 U/L   ALT 24 0 - 35 U/L   Total Protein 6.8 6.0 - 8.3 g/dL   Albumin  4.3 3.5 - 5.2 g/dL   GFR 52.84 (L) >13.24  mL/min   Calcium  9.8 8.4 - 10.5 mg/dL  Lipid panel   Collection Time: 08/15/23  7:54 AM  Result Value Ref Range   Cholesterol 182 0 - 200 mg/dL   Triglycerides 295.6 0.0 - 149.0 mg/dL   HDL 21.30 >86.57 mg/dL   VLDL 84.6 0.0 - 96.2 mg/dL   LDL Cholesterol 98 0 - 99 mg/dL   Total CHOL/HDL Ratio 3    NonHDL 121.16   TSH   Collection Time: 08/15/23  7:54 AM  Result Value Ref Range   TSH 0.23 (L) 0.35 - 5.50 uIU/mL  Hemoglobin A1c   Collection Time: 08/15/23  7:54 AM  Result Value Ref Range   Hgb A1c MFr Bld 6.2 4.6 - 6.5 %   *Note: Due to a large number of results and/or encounters for the requested time period, some results have not been displayed. A complete set of results can be found in Results Review.    Assessment & Plan:   Problem List Items Addressed This Visit     Advanced care planning/counseling discussion (Chronic)   Previously discussed      Health maintenance examination - Primary (Chronic)   Preventative protocols reviewed and updated unless pt declined. Discussed healthy diet and lifestyle.       Hypothyroidism   TSH low suggesting overtreated thyroid .  Will drop levothyroxine  dose to 100mcg daily, once weekly taking 1/2 tab (50mcg)      Relevant Medications   levothyroxine  (SYNTHROID ) 100 MCG tablet   POAG (primary open-angle glaucoma)   Appreciate eye doctor care.       Essential hypertension   Chronic, difficult to control, doing well on current regimen - continue this.       Relevant Medications    furosemide  (LASIX ) 20 MG tablet   apixaban  (ELIQUIS ) 5 MG TABS tablet   Osteoporosis   Desires to defer repeat DEXA scan at this time.       Prediabetes   Reviewed limiting added sugar in diet.       Anxiety associated with depression   Chronic, stable period on cymbalta - continue.       GERD (gastroesophageal reflux disease)   Chronic, stable period on pepcid .       Stage 3b chronic kidney disease (HCC)   Chronic, stable period. Continue to monitor. Encourage good hydration status.  Limit nephrotoxic medications.       Pseudogout   Sees rheum on colchicine  daily with PRN BID dosing      Iron  deficiency anemia   This is largely resolved on once weekly iron  - will trial off oral iron  replacement.      Pruritic condition   Bilateral hearing loss   Wears hearing aides.       Persistent atrial fibrillation (HCC)   Chronic, continue eliquis .       Relevant Medications   furosemide  (LASIX ) 20 MG tablet   apixaban  (ELIQUIS ) 5 MG TABS tablet   Atrial flutter (HCC)   Eliquis  refilled.       Relevant Medications   furosemide  (LASIX ) 20 MG tablet   apixaban  (ELIQUIS ) 5 MG TABS tablet   OSA treated with BiPAP   Appreciate pulm care, overall doing well on nightly BiPAP      Polycythemia   Recurrent, mild.  ?osa related.  Will stop oral iron .       Exertional dyspnea   Continue lasix  20mg  daily.       Relevant Medications   furosemide  (LASIX ) 20 MG tablet  Meds ordered this encounter  Medications   furosemide  (LASIX ) 20 MG tablet    Sig: Take 1 tablet (20 mg total) by mouth daily.    Dispense:  90 tablet    Refill:  4   apixaban  (ELIQUIS ) 5 MG TABS tablet    Sig: Take 1 tablet (5 mg total) by mouth 2 (two) times daily.    Dispense:  180 tablet    Refill:  3   DISCONTD: levothyroxine  (SYNTHROID ) 100 MCG tablet    Sig: Take 1 tablet (100 mcg total) by mouth daily before breakfast.    Dispense:  90 tablet    Refill:  4   levothyroxine  (SYNTHROID ) 100  MCG tablet    Sig: Take 1 tablet (100 mcg total) by mouth daily before breakfast. One day a week take 1/2 tablet (50mcg)    Dispense:  80 tablet    Refill:  3   Magnesium  250 MG CAPS    Sig: Take 1 capsule by mouth at bedtime.    No orders of the defined types were placed in this encounter.   Patient Instructions  Drop levothyroxine  dose to 100mcg daily, one day a week taking only 1/2 tablet.  Ok to hold oral iron  at this time.  Good to see you today  Return as needed or in 6 months for follow up visit, prior for fasting labs   Follow up plan: Return in about 6 months (around 02/21/2024) for follow up visit.  Claire Crick, MD

## 2023-08-22 NOTE — Assessment & Plan Note (Signed)
 Chronic, stable period on pepcid .

## 2023-08-22 NOTE — Assessment & Plan Note (Signed)
 Chronic, stable period. Continue to monitor. Encourage good hydration status.  Limit nephrotoxic medications.

## 2023-08-22 NOTE — Telephone Encounter (Signed)
 Clonazepam  refill requested at today's OV.

## 2023-08-24 DIAGNOSIS — M11211 Other chondrocalcinosis, right shoulder: Secondary | ICD-10-CM | POA: Diagnosis not present

## 2023-08-24 DIAGNOSIS — M19011 Primary osteoarthritis, right shoulder: Secondary | ICD-10-CM | POA: Diagnosis not present

## 2023-08-24 NOTE — Telephone Encounter (Signed)
 ERx

## 2023-08-25 ENCOUNTER — Encounter: Payer: Self-pay | Admitting: Sleep Medicine

## 2023-08-25 ENCOUNTER — Ambulatory Visit: Admitting: Sleep Medicine

## 2023-08-25 VITALS — BP 128/82 | HR 79 | Temp 97.1°F | Ht 60.0 in | Wt 174.6 lb

## 2023-08-25 DIAGNOSIS — I4891 Unspecified atrial fibrillation: Secondary | ICD-10-CM | POA: Diagnosis not present

## 2023-08-25 DIAGNOSIS — G4733 Obstructive sleep apnea (adult) (pediatric): Secondary | ICD-10-CM | POA: Diagnosis not present

## 2023-08-25 NOTE — Progress Notes (Signed)
 Name:Brooke Beasley MRN: [id number] DOB: July 30, 1930   CHIEF COMPLAINT:  PAP F/U   HISTORY OF PRESENT ILLNESS:  Brooke Beasley is a 89 y.o. w/ a h/o OSA, hypothyroidism, GERD and atrial fibrillation who presents for PAP follow up visit. Reports using BIPAP therapy every night. States that she still feels unrefreshed upon awakening on most days. Patient had a split night study recently, she was not titrated to an adequate pressure on BIPAP. Study revealed a high residual AHI on BIPAP.      EPWORTH SLEEP SCORE    11/13/2021   11:00 AM  Results of the Epworth flowsheet  Sitting and reading 3  Watching TV 2  Sitting, inactive in a public place (e.g. a theatre or a meeting) 0  As a passenger in a car for an hour without a break 1  Lying down to rest in the afternoon when circumstances permit 3  Sitting and talking to someone 0  Sitting quietly after a lunch without alcohol 2  In a car, while stopped for a few minutes in traffic 0  Total score 11    PAST MEDICAL HISTORY :   has a past medical history of Allergy  (1965), Anxiety (1965), Basal cell carcinoma, arm, left (02/2023), C. difficile diarrhea (07/11/2018), CAP (community acquired pneumonia) (12/22/2017), Carotid stenosis (10/18/2014), CKD (chronic kidney disease) stage 3, GFR 30-59 ml/min (HCC) (08/02/2014), Closed fracture of right orbit (HCC) (02/10/2021), Clostridioides difficile infection, COVID-19 virus infection (10/13/2020), Degenerative disc disease, Depression (1965), Diverticulosis, Family history of adverse reaction to anesthesia, GERD (gastroesophageal reflux disease) (1980), Glaucoma, Heart murmur (5/13), History of hiatal hernia, History of shingles, HTN (hypertension), Hyperlipidemia, Hypertension (2017), Hypothyroidism, IBS (11/02/2006), Influenza A (04/20/2022), Intestinal bacterial overgrowth, Left shoulder pain (11/04/2014), Lower GI bleed (12/2020), Maxillary fracture (HCC) (04/08/2012), Osteoarthritis  (2016), Osteoporosis, Perirectal abscess (08/03/2021), Pseudogout (2016), and Strep pharyngitis (04/20/2022).  has a past surgical history that includes Tonsillectomy; Bowel obstruction (1999); Colonoscopy (1. 1999  2. 11/04); Dexa (1. 1997-1998   2. 9/04  3. 3/08 ); Esophagogastroduodenoscopy; Hemorrhoid procedure (07/2006); DG KNEE 1-2 VIEWS BILAT; US  ECHOCARDIOGRAPHY (07/2011); barium enema (2012); Total shoulder arthroplasty (Left, 11/27/2015); Total shoulder revision (Left, 03/16/2016); Colonoscopy with propofol  (N/A, 09/10/2019); Flexible sigmoidoscopy (N/A, 01/16/2021); XI robotic assisted lower anterior resection (N/A, 05/26/2021); Rectopexy (N/A, 05/26/2021); Proctoscopy (N/A, 05/26/2021); TEE without cardioversion (N/A, 07/27/2021); Bubble study (07/27/2021); PICC LINE REMOVAL ([facility] HX) (08/28/2021); Cardioversion (N/A, 11/19/2021); and Joint replacement. Prior to Admission medications   Medication Sig Start Date End Date Taking? Authorizing Provider  apixaban  (ELIQUIS ) 5 MG TABS tablet Take 1 tablet (5 mg total) by mouth 2 (two) times daily. 08/22/23   Claire Crick, MD  Ascorbic Acid  (VITAMIN C PO) Take by mouth.    [provider]  bimatoprost (LUMIGAN) 0.01 % SOLN Place 1 drop into both eyes at bedtime.    [provider]  carvedilol  (COREG ) 3.125 MG tablet Take 1 tablet (3.125 mg total) by mouth 2 (two) times daily with a meal. 07/18/23   Maudine Sos, MD  cetirizine  [facility] ALLERGY  RELIEF, CETIRIZINE ,) 10 MG tablet Take 1 tablet (10 mg total) by mouth daily. 06/28/23   Kozlow, Rema Care, MD  cholecalciferol  (VITAMIN D ) 1000 units tablet Take 1,000 Units by mouth daily.    [provider]  clonazePAM  (KLONOPIN ) 0.5 MG tablet TAKE 1 TABLET BY MOUTH AT BEDTIME 08/24/23   Claire Crick, MD  colchicine  0.6 MG tablet Take 1 tablet (0.6 mg total) by mouth daily.  12/10/21   Felicita Horns, FNP  diclofenac  Sodium (VOLTAREN ) 1 % GEL Apply 2 g topically 4 (four) times  daily.    [provider]  DULoxetine  (CYMBALTA ) 20 MG capsule Take 1 capsule (20 mg total) by mouth daily. 08/13/22   Claire Crick, MD  EPINEPHrine  (EPIPEN  2-PAK) 0.3 mg/0.3 mL IJ SOAJ injection Inject 0.3 mg into the muscle as needed for anaphylaxis. XOLAIR  INJECTION PROTOCOL 04/09/22   Orelia Binet, MD  famotidine  (PEPCID ) 20 MG tablet Take 1 tablet (20 mg total) by mouth 2 (two) times daily. 04/22/23   Claire Crick, MD  furosemide  (LASIX ) 20 MG tablet Take 1 tablet (20 mg total) by mouth daily. 08/22/23   Claire Crick, MD  gabapentin  (NEURONTIN ) 300 MG capsule TAKE 1 CAPSULE BY MOUTH ONCE DAILY AS NEEDED FOR  NERVE  PAIN  (ITCHING) 04/22/23   Claire Crick, MD  hydrALAZINE  (APRESOLINE ) 25 MG tablet Take 1 tablet (25 mg total) by mouth in the morning and at bedtime. 07/18/23   Maudine Sos, MD  levothyroxine  (SYNTHROID ) 100 MCG tablet Take 1 tablet (100 mcg total) by mouth daily before breakfast. One day a week take 1/2 tablet (50mcg) 08/22/23   Claire Crick, MD  Magnesium  250 MG CAPS Take 1 capsule by mouth at bedtime. 08/22/23   Claire Crick, MD  mometasone  (ELOCON ) 0.1 % ointment Apply topically daily. Shower than ointment applied while still wet daily 06/23/23   Claire Crick, MD  Multiple Vitamin (MULTIVITAMIN ADULT PO) Take by mouth daily.    [provider]  polyethylene glycol (MIRALAX  / GLYCOLAX ) 17 g packet Take 17 g by mouth daily.    [provider]  Simethicone  180 MG CAPS Take 1 capsule by mouth daily as needed (indigestion).    [provider]  spironolactone  (ALDACTONE ) 25 MG tablet TAKE 1 AND 1/2 TABLETS DAILY 05/23/23   Maudine Sos, MD  timolol (TIMOPTIC) 0.5 % ophthalmic solution Place 1 drop into both eyes every morning. 05/28/22   [provider]   Allergies  Allergen Reactions   Ace Inhibitors Cough   Amlodipine  Nausea Only and Other (See Comments)    Stomach pains--"feel like my insides  are on fire"   Carvedilol  Other (See Comments)    fatigue   Chlorthalidone  Other (See Comments)    Severe hyponatremia   Montelukast  Other (See Comments)    Slurred speech; really drowsy   Other Other (See Comments)    No seeds or corn because of IBS   Risedronate Sodium Other (See Comments)    REACTION: joint pain   Simvastatin  Other (See Comments)    REACTION: the full 20 mg pill causes leg pain- can tol 10 mg   Prolia  [Denosumab ] Other (See Comments)    facial swelling, throat tightness, right greater than left foot swelling, right great toe numbness   Alendronate Sodium Palpitations   Influenza Vaccines Rash and Other (See Comments)    Led to mental breakdown in 1960s    Losartan  Other (See Comments)    Cr bumped   Paroxetine Hcl Other (See Comments)    Not effective - felt ill on this medicine   Silenor  [Doxepin  Hcl] Palpitations    Hallucinations and racing heart   Sulfonamide Derivatives Rash   Toprol  Xl [Metoprolol ] Diarrhea    Diarrhea and weakness    FAMILY HISTORY:  family history includes Breast cancer in her cousin; Coronary artery disease in her brother; Diabetes in her brother and paternal grandfather. SOCIAL HISTORY:  reports  that she has never smoked. She has never used smokeless tobacco. She reports that she does not drink alcohol and does not use drugs.   Review of Systems:  Gen:  Denies  fever, sweats, chills weight loss  HEENT: Denies blurred vision, double vision, ear pain, eye pain, hearing loss, nose bleeds, sore throat Cardiac:  No dizziness, chest pain or heaviness, chest tightness,edema, No JVD Resp:   No cough, -sputum production, -shortness of breath,-wheezing, -hemoptysis,  Gi: Denies swallowing difficulty, stomach pain, nausea or vomiting, diarrhea, constipation, bowel incontinence Gu:  Denies bladder incontinence, burning urine Ext:   Denies Joint pain, stiffness or swelling Skin: Denies  skin rash, easy bruising or bleeding or  hives Endoc:  Denies polyuria, polydipsia , polyphagia or weight change Psych:   Denies depression, insomnia or hallucinations  Other:  All other systems negative  VITAL SIGNS: BP 128/82 (BP Location: Right Arm, Cuff Size: Normal)   Pulse 79   Temp (!) 97.1 F (36.2 C)   Ht 5' (1.524 m)   Wt 174 lb 9.6 oz (79.2 kg)   SpO2 93%   BMI 34.10 kg/m     Physical Examination:   General Appearance: No distress  EYES PERRLA, EOM intact.   NECK Supple, No JVD Pulmonary: normal breath sounds, No wheezing.  CardiovascularNormal S1,S2.  No m/r/g.   Abdomen: Benign, Soft, non-tender. Skin:   warm, no rashes, no ecchymosis  Extremities: normal, no cyanosis, clubbing. Neuro:without focal findings,  speech normal  PSYCHIATRIC: Mood, affect within normal limits.   ASSESSMENT AND PLAN  OSA I suspect that patient may need ASV therapy due to elevated central events. Will complete a BIPAP/ASV titration study and follow up to review results. Discussed the consequences of untreated sleep apnea. Advised not to drive drowsy for safety of patient and others.    Atrial fibrillation Stable, on current management. Following with cardiology.     MEDICATION ADJUSTMENTS/LABS AND TESTS ORDERED: Recommend Sleep Study   Patient  satisfied with Plan of action and management. All questions answered  I spent a total of 22 minutes reviewing chart data, face-to-face evaluation with the patient, counseling and coordination of care as detailed above.    Hildreth Robart, M.D.  Sleep Medicine [facility]

## 2023-08-26 ENCOUNTER — Telehealth: Payer: Self-pay | Admitting: Family Medicine

## 2023-08-26 NOTE — Telephone Encounter (Signed)
 Spoke with Carolynne Citron ([facility]) and confirmed medication (levothyroxine  100mg ), dosage, and duration for patient.

## 2023-08-26 NOTE — Telephone Encounter (Signed)
 Copied from CRM [id number]. Topic: Clinical - Prescription Issue >> Aug 25, 2023  4:45 PM Star [place] wrote: Reason for CRM: levothyroxine  (SYNTHROID ) 100 MCG tablet- questions about dosage instructions and the amount of pills- Carolynne Citron with [facility]- [phone]

## 2023-09-01 ENCOUNTER — Encounter: Payer: Self-pay | Admitting: Internal Medicine

## 2023-09-05 ENCOUNTER — Other Ambulatory Visit: Payer: Self-pay

## 2023-09-05 ENCOUNTER — Other Ambulatory Visit: Payer: Self-pay | Admitting: Family Medicine

## 2023-09-05 DIAGNOSIS — G4734 Idiopathic sleep related nonobstructive alveolar hypoventilation: Secondary | ICD-10-CM

## 2023-09-05 DIAGNOSIS — G4733 Obstructive sleep apnea (adult) (pediatric): Secondary | ICD-10-CM

## 2023-09-05 DIAGNOSIS — R0683 Snoring: Secondary | ICD-10-CM

## 2023-09-05 DIAGNOSIS — R0609 Other forms of dyspnea: Secondary | ICD-10-CM

## 2023-09-05 NOTE — Telephone Encounter (Signed)
 Order for titration has been placed for [place].

## 2023-09-05 NOTE — Telephone Encounter (Signed)
 Too soon. Rx sent 08/22/23, #90/4 refills to [facility] ([facility].   Request denied.

## 2023-09-05 NOTE — Telephone Encounter (Signed)
 Patient was seen by Dr. Kieran Pellet on 08/25/23 and her note stated will complete Bipap/ASV Titration Study but there was no order placed

## 2023-09-08 DIAGNOSIS — D692 Other nonthrombocytopenic purpura: Secondary | ICD-10-CM | POA: Diagnosis not present

## 2023-09-08 DIAGNOSIS — Z85828 Personal history of other malignant neoplasm of skin: Secondary | ICD-10-CM | POA: Diagnosis not present

## 2023-09-09 NOTE — Telephone Encounter (Signed)
 The new order has been faxed to [facility]

## 2023-09-13 ENCOUNTER — Ambulatory Visit: Attending: Sleep Medicine

## 2023-09-13 DIAGNOSIS — G4734 Idiopathic sleep related nonobstructive alveolar hypoventilation: Secondary | ICD-10-CM | POA: Insufficient documentation

## 2023-09-13 DIAGNOSIS — G4733 Obstructive sleep apnea (adult) (pediatric): Secondary | ICD-10-CM | POA: Diagnosis not present

## 2023-09-13 DIAGNOSIS — R0683 Snoring: Secondary | ICD-10-CM | POA: Diagnosis not present

## 2023-09-14 ENCOUNTER — Encounter: Payer: Self-pay | Admitting: Family Medicine

## 2023-09-14 ENCOUNTER — Encounter (HOSPITAL_BASED_OUTPATIENT_CLINIC_OR_DEPARTMENT_OTHER): Admitting: Family

## 2023-09-14 DIAGNOSIS — E039 Hypothyroidism, unspecified: Secondary | ICD-10-CM

## 2023-09-15 ENCOUNTER — Ambulatory Visit (HOSPITAL_BASED_OUTPATIENT_CLINIC_OR_DEPARTMENT_OTHER)

## 2023-09-15 ENCOUNTER — Encounter (HOSPITAL_BASED_OUTPATIENT_CLINIC_OR_DEPARTMENT_OTHER): Payer: Self-pay | Admitting: Cardiovascular Disease

## 2023-09-15 ENCOUNTER — Ambulatory Visit (HOSPITAL_BASED_OUTPATIENT_CLINIC_OR_DEPARTMENT_OTHER): Admitting: Cardiovascular Disease

## 2023-09-15 VITALS — BP 143/82 | HR 83 | Ht 60.0 in | Wt 173.4 lb

## 2023-09-15 DIAGNOSIS — I6529 Occlusion and stenosis of unspecified carotid artery: Secondary | ICD-10-CM | POA: Diagnosis not present

## 2023-09-15 DIAGNOSIS — I10 Essential (primary) hypertension: Secondary | ICD-10-CM | POA: Diagnosis not present

## 2023-09-15 DIAGNOSIS — G4733 Obstructive sleep apnea (adult) (pediatric): Secondary | ICD-10-CM | POA: Diagnosis not present

## 2023-09-15 DIAGNOSIS — I4892 Unspecified atrial flutter: Secondary | ICD-10-CM

## 2023-09-15 DIAGNOSIS — E038 Other specified hypothyroidism: Secondary | ICD-10-CM | POA: Diagnosis not present

## 2023-09-15 DIAGNOSIS — R0781 Pleurodynia: Secondary | ICD-10-CM | POA: Diagnosis not present

## 2023-09-15 NOTE — Patient Instructions (Addendum)
 Medication Instructions:  Your physician recommends that you continue on your current medications as directed. Please refer to the Current Medication list given to you today.   Labwork: TSH/CRP/SED RATE TODAY   Testing/Procedures: Your physician has requested that you have an echocardiogram. Echocardiography is a painless test that uses sound waves to create images of your heart. It provides your doctor with information about the size and shape of your heart and how well your heart's chambers and valves are working. This procedure takes approximately one hour. There are no restrictions for this procedure. Please do NOT wear cologne, perfume, aftershave, or lotions (deodorant is allowed). Please arrive 15 minutes prior to your appointment time.  Please note: We ask at that you not bring children with you during ultrasound (echo/ vascular) testing. Due to room size and safety concerns, children are not allowed in the ultrasound rooms during exams. Our front office staff cannot provide observation of children in our lobby area while testing is being conducted. An adult accompanying a patient to their appointment will only be allowed in the ultrasound room at the discretion of the ultrasound technician under special circumstances. We apologize for any inconvenience.  A chest x-ray takes a picture of the organs and structures inside the chest, including the heart, lungs, and blood vessels. This test can show several things, including, whether the heart is enlarges; whether fluid is building up in the lungs; and whether pacemaker / defibrillator leads are still in place.  Follow-Up: 6 MONTHS WITH DR [place] OR CAITLIN W NP   Any Other Special Instructions Will Be Listed Below (If Applicable).

## 2023-09-15 NOTE — Telephone Encounter (Signed)
 Dr. Theodis Fiscal aware of BP log. Will discuss during OV.

## 2023-09-15 NOTE — Progress Notes (Signed)
 [facility] Follow Up:    Date:  09/15/2023   ID:  Brooke Beasley, DOB [birth year], MRN [id number]  PCP:  Brooke Crick, MD  Cardiologist:  None  Nephrologist:  Referring MD: Brooke Crick, MD   CC: Hypertension  History of Present Illness:    Brooke Beasley is a 89 y.o. female with a hx of persistent atrial fibrillation/flutter status post cardioversion, OSA on BiPAP, HFpEF, hypertension, hyperlipidemia, CKD 3, mild aortic stenosis, and anxiety here for follow up.  She first established care in the [facility] 09/2022.  She has a history of a history of atrial fibrillation.  Ventricular rates were slow so carvedilol  was reduced to 3.125 mg twice daily.  Blood pressure was elevated when she saw Dr. Tobe Beasley on 06/2022 but had generally been controlled.  At her visit 09/2022 she noted that her blood pressures were very labile ranging from the 90s to the 200s.  It started to become more difficult to control after surgery for rectal prolapse.  She also noted exertional dyspnea.  Amlodipine  was increased to twice daily.  She had an echo 11/2022 that revealed LVEF 65-70% with mild LVH and mildly reduced RV function.  She had mild posterior mitral valve leaflet prolapse and mild to moderate mitral regurgitation.  She had mild to moderate aortic stenosis as well.  Nuclear stress testing was low risk.  She saw Brooke Banks, NP 12/2022 and had self discontinued amlodipine  due to itching.  Blood pressure at home was averaging 137/79.  There was concern hydralazine  may be contributing to the itching so it was reduced and doxazosin  was added.    At her visit 03/2023 her main complaint was severe pruritus.  She noted that it did improve after stopping amlodipine  but then recurred.  Blood pressure at that visit was 162/94.  At home it was averaging in the 140s.  Doxazosin  and spironolactone  were both discontinued and carvedilol  was increased.  At her  visit 04/2023 her blood pressures were ranging from the 90s to the 150s.  She felt weak when her blood pressure well.  She also had a fall.  In the office blood pressure was 144/80.  Carvedilol  was reduced to 6.25 mg due to low heart rates.  Spironolactone  was increased.  She brings a log.  She noted her blood pressure has been averaging 144/96.  Average heart rate is in the 50s.  She continued to note itichin at her visit 06/2023.  Hydralazine  10mg  bid was added.  She ntoed episodes of dizziness and fatigue.  She wore an ambulatory monitor that showed 100% atrial fibrillation  burden and episodes of bradycardia to the 30s. Carvedilol  was decreased and hydralazine  was increased to 25mg .  She sent her home BP log with average BP 137/86  Discussed the use of AI scribe software for clinical note transcription with the patient, who gave verbal consent to proceed.  History of Present Illness Brooke Beasley is a 89 year old female who presents with pleuritic chest pain. Brooke Beasley has been experiencing pleuritic chest pain for the past week, particularly when taking deep breaths. No recent cold or upper respiratory infection. No recent falls or significant coughing.  She has a history of sleep apnea and recently underwent a sleep study, with results pending. She experiences red splotchiness and fluid retention, which has improved recently.  Her blood pressure has fluctuated, with recent readings between 110-140/70-90 mmHg, but previously reaching as high as 165/100 mmHg. She takes hydralazine   as needed to manage her blood pressure.  She experiences itching and rash, particularly on her arms, which becomes red and purplish. She uses camphor and cream to manage the symptoms. She notes bruising on her arms, which she attributes to pressure and her blood thinner medication, Eliquis .  She has a history of hypothyroidism and is currently taking a reduced dose of her thyroid  medication. Her thyroid  levels  were last checked in April. She reports weight gain, which she attributes to fluid retention, and has noticed bruising and skin changes, particularly under her arms.  Previous antihypertensives: ACE-I Carvedilol  Chlorthalidone  Losartan  metoprolol  Atenolol  Chlorthalidone  Hydrochlorothiazide  Irbesartan  Imdur  Hydrochlorothiazide -triamterene  Hydralazine  Doxazosin -patient  Past Medical History:  Diagnosis Date   Allergy  1965   Anxiety 1965   Basal cell carcinoma, arm, left 02/2023   L mid lateral posterior arm ([place])   C. difficile diarrhea 07/11/2018   S/p multiple oral vanc treatments (course and tapers) and completed Zinplava  monoclonal Ab treatment (05/10/2019) Fecal transplant on hold during COVID pandemic   CAP (community acquired pneumonia) 12/22/2017   Carotid stenosis 10/18/2014   R 1-39%, L 40-59%, rpt 1 yr (09/2014)    CKD (chronic kidney disease) stage 3, GFR 30-59 ml/min (HCC) 08/02/2014   Closed fracture of right orbit (HCC) 02/10/2021   Clostridioides difficile infection    hx 2020   COVID-19 virus infection 10/13/2020   again 10/2022   Degenerative disc disease    LS   Depression 1965   nervous breakdonw in 1964-out of work for a year   Diverticulosis    severe by colonoscopy   Family history of adverse reaction to anesthesia    n/v   GERD (gastroesophageal reflux disease) 1980   Glaucoma    Heart murmur 5/13   mitral regurge - on echo    History of hiatal hernia    History of shingles    HTN (hypertension)    Hyperlipidemia    Hypertension 2017   Hypothyroidism    IBS 11/02/2006   Influenza A 04/20/2022   Intestinal bacterial overgrowth    In small colon   Left shoulder pain 11/04/2014   Lower GI bleed 12/2020   thought diverticular complicated by ABLA with syncope and orbital fracture s/p hospitalization   Maxillary fracture (HCC) 04/08/2012   Osteoarthritis 2016   Brooke Beasley)   Osteoporosis    dexa 2011   Perirectal abscess 08/03/2021   CT  guided aspiration of abscess grow Proteums mirabilis and Citrobacter koseri 07/2021, currently receiving IV Zosyn  planned total 6 wks.   Pseudogout 2016   shoulders ([place])   Strep pharyngitis 04/20/2022    Past Surgical History:  Procedure Laterality Date   barium enema  2012   severe diverticulosis, redundant colon   Bowel obstruction  1999   no surgery in hosp x 3 days   BUBBLE STUDY  07/27/2021   Procedure: BUBBLE STUDY;  Surgeon: Pasqual Bone, MD;  Location: [facility] ENDOSCOPY;  Service: Cardiovascular;;   CARDIOVERSION N/A 11/19/2021   Procedure: CARDIOVERSION;  Surgeon: Constancia Delton, MD;  Location: [facility] ORS;  Service: Cardiovascular;  Laterality: N/A;   COLONOSCOPY  1. 1999  2. 11/04   1. Not finished  2. Slight hemorrhage rectosigmoid area, severe sig diverticulosis   COLONOSCOPY WITH PROPOFOL  N/A 09/10/2019   SSP with dysplasia, TA, rpt 3 yrs Baldomero Bone, Elson Halon, MD)   Dexa  1. [phone]   2. 9/04  3. 3/08    1. OP  2. OP, borderline, spine -2.44T  3. decreased  BMD-OP   DG KNEE 1-2 VIEWS BILAT     LS x-ray with degenerative disc and facet change   ESOPHAGOGASTRODUODENOSCOPY     Negative   FLEXIBLE SIGMOIDOSCOPY N/A 01/16/2021   Procedure: FLEXIBLE SIGMOIDOSCOPY;  Surgeon: Kenney Peacemaker, MD;  Location: [facility] ENDOSCOPY;  Service: Endoscopy;  Laterality: N/A;  or unsedated   Hemorrhoid procedure  07/2006   JOINT REPLACEMENT     PICC LINE REMOVAL ([facility] HX)  08/28/2021   PROCTOSCOPY N/A 05/26/2021   Procedure: RIGID PROCTOSCOPY;  Surgeon: Candyce Champagne, MD;  Location: [facility] ORS;  Service: General;  Laterality: N/A;   RECTOPEXY N/A 05/26/2021   Procedure: RECTOPEXY;  Surgeon: Candyce Champagne, MD;  Location: [facility] ORS;  Service: General;  Laterality: N/A;   TEE WITHOUT CARDIOVERSION N/A 07/27/2021   Procedure: TRANSESOPHAGEAL ECHOCARDIOGRAM (TEE);  Surgeon: Pasqual Bone, MD;  Location: [facility] ENDOSCOPY;  Service: Cardiovascular;  Laterality: N/A;   TONSILLECTOMY     TOTAL SHOULDER  ARTHROPLASTY Left 11/27/2015   Elner Hahn, MD   TOTAL SHOULDER REVISION Left 03/16/2016   Procedure: TOTAL SHOULDER REVISION;  Surgeon: Elner Hahn, MD;  Location: [facility] ORS;  Service: Orthopedics;  Laterality: Left;   US  ECHOCARDIOGRAPHY  07/2011   Normal systolic fxn with EF 55-60%.  Focal basal septal hypertrophy.  Mild diastolic dysfunction.  Mild MR.   XI ROBOTIC ASSISTED LOWER ANTERIOR RESECTION N/A 05/26/2021   Procedure: ROBOTIC LOW ANTERIOR RECTOSIGMOID RESECTION; ASSESSMENT OF TISSUE PERFUSION WITH FIREFLY;  Surgeon: Candyce Champagne, MD;  Location: [facility] ORS;  Service: General;  Laterality: N/A;    Current Medications: Current Meds  Medication Sig   apixaban  (ELIQUIS ) 5 MG TABS tablet Take 1 tablet (5 mg total) by mouth 2 (two) times daily.   Ascorbic Acid  (VITAMIN C PO) Take by mouth.   bimatoprost (LUMIGAN) 0.01 % SOLN Place 1 drop into both eyes at bedtime.   carvedilol  (COREG ) 3.125 MG tablet Take 1 tablet (3.125 mg total) by mouth 2 (two) times daily with a meal.   cetirizine  (EQ ALLERGY  RELIEF, CETIRIZINE ,) 10 MG tablet Take 1 tablet (10 mg total) by mouth daily.   cholecalciferol  (VITAMIN D ) 1000 units tablet Take 1,000 Units by mouth daily.   clonazePAM  (KLONOPIN ) 0.5 MG tablet TAKE 1 TABLET BY MOUTH AT BEDTIME   colchicine  0.6 MG tablet Take 1 tablet (0.6 mg total) by mouth daily.   diclofenac  Sodium (VOLTAREN ) 1 % GEL Apply 2 g topically 4 (four) times daily.   DULoxetine  (CYMBALTA ) 20 MG capsule Take 1 capsule (20 mg total) by mouth daily.   EPINEPHrine  (EPIPEN  2-PAK) 0.3 mg/0.3 mL IJ SOAJ injection Inject 0.3 mg into the muscle as needed for anaphylaxis. XOLAIR  INJECTION PROTOCOL   famotidine  (PEPCID ) 20 MG tablet Take 1 tablet (20 mg total) by mouth 2 (two) times daily.   furosemide  (LASIX ) 20 MG tablet Take 1 tablet (20 mg total) by mouth daily.   gabapentin  (NEURONTIN ) 300 MG capsule TAKE 1 CAPSULE BY MOUTH ONCE DAILY AS NEEDED FOR  NERVE  PAIN  (ITCHING)    hydrALAZINE  (APRESOLINE ) 25 MG tablet Take 1 tablet (25 mg total) by mouth in the morning and at bedtime.   levothyroxine  (SYNTHROID ) 100 MCG tablet Take 1 tablet (100 mcg total) by mouth daily before breakfast. One day a week take 1/2 tablet ( )   Magnesium  250 MG CAPS Take 1 capsule by mouth at bedtime.   mometasone  (ELOCON ) 0.1 % ointment Apply topically daily. Shower than ointment applied while still wet  daily   Multiple Vitamin (MULTIVITAMIN ADULT PO) Take by mouth daily.   polyethylene glycol (MIRALAX  / GLYCOLAX ) 17 g packet Take 17 g by mouth daily.   Simethicone  180 MG CAPS Take 1 capsule by mouth daily as needed (indigestion).   spironolactone  (ALDACTONE ) 25 MG tablet TAKE 1 AND 1/2 TABLETS DAILY   timolol (TIMOPTIC) 0.5 % ophthalmic solution Place 1 drop into both eyes every morning.     Allergies:   Ace inhibitors, Amlodipine , Carvedilol , Chlorthalidone , Montelukast , Other, Risedronate sodium, Simvastatin , Prolia  [denosumab ], Alendronate sodium, Influenza vaccines, Losartan , Paroxetine hcl, Silenor  [doxepin  hcl], Sulfonamide derivatives, and Toprol  xl [metoprolol ]   Social History   Socioeconomic History   Marital status: Widowed    Spouse name: Not on file   Number of children: 2   Years of education: Not on file   Highest education level: 12th grade  Occupational History   Occupation: Retired    Associate Professor: RETIRED  Tobacco Use   Smoking status: Never   Smokeless tobacco: Never  Vaping Use   Vaping status: Never Used  Substance and Sexual Activity   Alcohol use: No   Drug use: No   Sexual activity: Not Currently  Other Topics Concern   Not on file  Social History Narrative   Left handed   Widow. Husband ([name]) deceased 03-14-16 from dementia. She was caregiver.    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   Activity: no regular exercise   Diet: good water, fruits/vegetables daily      Patient  Care Team:   Brooke Crick, MD as PCP - General (Family Medicine)   Constancia Delton, MD as PCP - Cardiology (Cardiology)   Sheria Dills, RN as [facility] Care Management   Gross, Landon Pinion, MD as Consulting Physician (General Surgery)   Selena Daily, MD as Consulting Physician (Gastroenterology)   Constancia Delton, MD as Consulting Physician (Cardiology)   Social Drivers of Health   Financial Resource Strain: Low Risk  (07/20/2023)   Overall Financial Resource Strain (CARDIA)    Difficulty of Paying Living Expenses: Not hard at all  Food Insecurity: No Food Insecurity (07/20/2023)   Hunger Vital Sign    Worried About Running Out of Food in the Last Year: Never true    Ran Out of Food in the Last Year: Never true  Transportation Needs: No Transportation Needs (07/20/2023)   PRAPARE - Transportation    Lack of Transportation (Medical): No    Lack of Transportation (Non-Medical): No  Physical Activity: Inactive (07/20/2023)   Exercise Vital Sign    Days of Exercise per Week: 0 days    Minutes of Exercise per Session: 0 min  Stress: No Stress Concern Present (07/20/2023)   [employer] of Occupational Health - Occupational Stress Questionnaire    Feeling of Stress : Not at all  Social Connections: Socially Isolated (07/20/2023)   Social Connection and Isolation Panel [NHANES]    Frequency of Communication with Friends and Family: Three times a week    Frequency of Social Gatherings with Friends and Family: Once a week    Attends Religious Services: Never    Database administrator or Organizations: No    Attends Banker Meetings: Never    Marital Status: Widowed     Family History: The patient's family history includes Breast cancer in her cousin; Coronary artery disease in her brother; Diabetes in her  brother and paternal grandfather.  ROS:   Please see the history of present illness.     All other systems reviewed and are  negative.  EKGs/Labs/Other Studies Reviewed:    EKG:  EKG is not ordered today.    Recent Labs: 06/01/2023: BNP 153.5 08/15/2023: ALT 24; BUN 19; Creatinine, Ser 1.12; Hemoglobin 16.0; Platelets 232.0; Potassium 4.3; Sodium 137; TSH 0.23   Recent Lipid Panel    Component Value Date/Time   CHOL 182 08/15/2023 0754   CHOL 179 03/27/2021 0935   TRIG 114.0 08/15/2023 0754   HDL 61.10 08/15/2023 0754   HDL 71 03/27/2021 0935   CHOLHDL 3 08/15/2023 0754   VLDL 22.8 08/15/2023 0754   LDLCALC 98 08/15/2023 0754   LDLCALC 95 03/27/2021 0935   LDLDIRECT 86.0 09/19/2014 1049    Physical Exam:   VS:  BP (!) 143/82   Pulse 83   Ht 5' (1.524 m)   Wt 173 lb 6.4 oz (78.7 kg)   SpO2 95%   BMI 33.86 kg/m  , BMI Body mass index is 33.86 kg/m. GENERAL:  Well appearing HEENT: Pupils equal round and reactive, fundi not visualized, oral mucosa unremarkable NECK:  No jugular venous distention, waveform within normal limits, carotid upstroke brisk and symmetric, no bruits, no thyromegaly LUNGS:  Clear to auscultation bilaterally HEART:  Irregularly irregular.  PMI not displaced or sustained,S1 and S2 within normal limits, no S3, no S4, no clicks, no rubs, III/VI mid-peaking systolic murmur at the LUSB ABD:  Flat, positive bowel sounds normal in frequency in pitch, no bruits, no rebound, no guarding, no midline pulsatile mass, no hepatomegaly, no splenomegaly EXT:  2 plus pulses throughout, no edema, no cyanosis no clubbing SKIN:  No rashes no nodules NEURO:  Cranial nerves II through XII grossly intact, motor grossly intact throughout PSYCH:  Cognitively intact, oriented to person place and time  ASSESSMENT/PLAN:    Assessment & Plan # Pleuritic chest pain Intermittent chest pain exacerbated by deep inspiration, suggestive of pleuritic chest pain. Differential includes pleurisy, pericarditis, or rib fracture. Pain not typical of myocardial infarction. - Order ESR and CRP to assess inflammation  markers. - Order chest x-ray to evaluate for fluid or other abnormalities. - Schedule echocardiogram to assess aortic valve function.  # Hypertension Blood pressure fluctuating between 110-140/70-90 mmHg. Current management includes hydralazine  with occasional additional doses for elevated readings. - Maintain current medication regimen.  # Hypothyroidism Hypothyroidism under management with recent dose adjustment. Last thyroid  function test in April. - Order TSH to evaluate current thyroid  function.  # Itching and rash Persistent itching and rash with episodes of red splotchiness and bruising. Bruising possibly exacerbated by blood thinners and thin skin.    Screening for Secondary Hypertension:     10/18/2022    3:11 PM  Causes  Drugs/Herbals Screened     - Comments limits added salt.  No caffeine or alcohol.  Renovascular HTN Screened     - Comments abdominal CT-A. Renal Doppler inconclusive 08/2021  Sleep Apnea Screened     - Comments uses CPAP  Thyroid  Disease Screened  Hyperaldosteronism Screened     - Comments check renin and aldosterone  Pheochromocytoma Screened     - Comments check catecholamines and metanephrines  Cushing's Syndrome N/A  Hyperparathyroidism Screened  Coarctation of the Aorta Screened  Compliance Screened    Relevant Labs/Studies:    Latest Ref Rng & Units 08/15/2023    7:54 AM 06/17/2023    4:01 PM 06/01/2023  10:38 AM  Basic Labs  Sodium 135 - 145 mEq/L 137  136  135   Potassium 3.5 - 5.1 mEq/L 4.3  4.4  4.8   Creatinine 0.40 - 1.20 mg/dL 8.29  5.62  1.30        Latest Ref Rng & Units 08/15/2023    7:54 AM 10/06/2022   12:47 PM  Thyroid    TSH 0.35 - 5.50 uIU/mL 0.23  4.81        Latest Ref Rng & Units 10/19/2022    8:45 AM  Renin/Aldosterone   Aldosterone 0.0 - 30.0 ng/dL 86.5   Aldos/Renin Ratio 0.0 - 30.0 8.9        Latest Ref Rng & Units 10/19/2022    8:45 AM  Metanephrines/Catecholamines   Epinephrine  0 - 62 pg/mL 33    Norepinephrine 0 - 874 pg/mL 1,538   Dopamine 0 - 48 pg/mL <30   Metanephrines 0.0 - 88.0 pg/mL <25.0   Normetanephrines  0.0 - 297.2 pg/mL 132.4        Latest Ref Rng & Units 09/07/2021    4:31 AM  Cortisol  Cortisol  ug/dL 78.4        10/02/6293    9:03 AM  Renovascular   Renal Artery US  Completed Yes     Disposition:    FU with MD/PharmD in 2 months   Medication Adjustments/Labs and Tests Ordered: Current medicines are reviewed at length with the patient today.  Concerns regarding medicines are outlined above.  No orders of the defined types were placed in this encounter.  No orders of the defined types were placed in this encounter.    Signed, Maudine Sos, MD  09/15/2023 3:36 PM    [facility]

## 2023-09-16 ENCOUNTER — Ambulatory Visit: Payer: Self-pay | Admitting: Cardiovascular Disease

## 2023-09-16 DIAGNOSIS — I10 Essential (primary) hypertension: Secondary | ICD-10-CM

## 2023-09-16 LAB — C-REACTIVE PROTEIN: CRP: 5 mg/L (ref 0–10)

## 2023-09-16 LAB — TSH: TSH: 0.245 u[IU]/mL — ABNORMAL LOW (ref 0.450–4.500)

## 2023-09-16 LAB — SEDIMENTATION RATE: Sed Rate: 2 mm/h (ref 0–40)

## 2023-09-20 ENCOUNTER — Encounter (HOSPITAL_BASED_OUTPATIENT_CLINIC_OR_DEPARTMENT_OTHER): Payer: Self-pay | Admitting: Cardiovascular Disease

## 2023-09-21 ENCOUNTER — Ambulatory Visit (INDEPENDENT_AMBULATORY_CARE_PROVIDER_SITE_OTHER): Payer: Self-pay | Admitting: Sleep Medicine

## 2023-09-21 MED ORDER — LEVOTHYROXINE SODIUM 88 MCG PO TABS: 88.0000 ug | ORAL_TABLET | Freq: Every day | ORAL | 1 refills | Status: DC

## 2023-09-21 NOTE — Telephone Encounter (Signed)
 Please notify patient that she did well on BIPAP therapy, recommend proceeding with auto BIPAP therapy set to min EPAP 4 cm H2O, max IPAP 20 cm H2O, pressure support 5 with the Airfit F30 full face mask. Please also schedule a 3 month follow up visit if patient wishes to proceed with PAP therapy. Thanks

## 2023-09-23 NOTE — Telephone Encounter (Signed)
-----   Message from Woodson He, MD sent at 09/21/2023  3:59 PM EDT ----- Please notify patient that she did well on BIPAP therapy, recommend proceeding with auto BIPAP therapy set to min EPAP 4 cm H2O, max IPAP 20 cm H2O, pressure support 5 with the Airfit F30 full face mask. Please also schedule a 3 month follow up visit if patient wishes to proceed with PAP therapy. Thanks

## 2023-09-23 NOTE — Telephone Encounter (Signed)
 I notified the patient. She is currently using a BIPAP machine. Did you want to change her settings (I can not get download)? She is also asking about a follow up appt with you.

## 2023-09-24 ENCOUNTER — Other Ambulatory Visit: Payer: Self-pay | Admitting: Allergy and Immunology

## 2023-09-24 ENCOUNTER — Other Ambulatory Visit: Payer: Self-pay | Admitting: Family Medicine

## 2023-09-24 DIAGNOSIS — F418 Other specified anxiety disorders: Secondary | ICD-10-CM

## 2023-09-26 ENCOUNTER — Ambulatory Visit (INDEPENDENT_AMBULATORY_CARE_PROVIDER_SITE_OTHER): Admitting: Sleep Medicine

## 2023-09-26 ENCOUNTER — Encounter: Payer: Self-pay | Admitting: Sleep Medicine

## 2023-09-26 VITALS — BP 108/70 | HR 61 | Temp 97.5°F | Ht 60.0 in | Wt 174.0 lb

## 2023-09-26 DIAGNOSIS — I4891 Unspecified atrial fibrillation: Secondary | ICD-10-CM

## 2023-09-26 DIAGNOSIS — E039 Hypothyroidism, unspecified: Secondary | ICD-10-CM

## 2023-09-26 DIAGNOSIS — G4733 Obstructive sleep apnea (adult) (pediatric): Secondary | ICD-10-CM

## 2023-09-26 NOTE — Progress Notes (Signed)
 Name:Brooke Beasley MRN: [id number] DOB: Jan 30, 1931   CHIEF COMPLAINT:  BIPAP F/U   HISTORY OF PRESENT ILLNESS:  Brooke Beasley is a 89 y.o. w/ a h/o OSA, hypothyroidism, GERD and atrial fibrillation who presents for PAP follow up visit. The patient underwent BIPAP titration due to elevated AHI on compliance data. Patient was not adequately titrated on PAP therapy due to unclear reasons. States that she still feels unrefreshed upon awakening on most days.    EPWORTH SLEEP SCORE     11/13/2021   11:00 AM  Results of the Epworth flowsheet  Sitting and reading 3  Watching TV 2  Sitting, inactive in a public place (e.g. a theatre or a meeting) 0  As a passenger in a car for an hour without a break 1  Lying down to rest in the afternoon when circumstances permit 3  Sitting and talking to someone 0  Sitting quietly after a lunch without alcohol 2  In a car, while stopped for a few minutes in traffic 0  Total score 11     PAST MEDICAL HISTORY :   has a past medical history of Allergy  (1965), Anxiety (1965), Basal cell carcinoma, arm, left (02/2023), C. difficile diarrhea (07/11/2018), CAP (community acquired pneumonia) (12/22/2017), Carotid stenosis (10/18/2014), CKD (chronic kidney disease) stage 3, GFR 30-59 ml/min (HCC) (08/02/2014), Closed fracture of right orbit (HCC) (02/10/2021), Clostridioides difficile infection, COVID-19 virus infection (10/13/2020), Degenerative disc disease, Depression (1965), Diverticulosis, Family history of adverse reaction to anesthesia, GERD (gastroesophageal reflux disease) (1980), Glaucoma, Heart murmur (5/13), History of hiatal hernia, History of shingles, HTN (hypertension), Hyperlipidemia, Hypertension (2017), Hypothyroidism, IBS (11/02/2006), Influenza A (04/20/2022), Intestinal bacterial overgrowth, Left shoulder pain (11/04/2014), Lower GI bleed (12/2020), Maxillary fracture (HCC) (04/08/2012), Osteoarthritis (2016), Osteoporosis,  Perirectal abscess (08/03/2021), Pseudogout (2016), and Strep pharyngitis (04/20/2022).  has a past surgical history that includes Tonsillectomy; Bowel obstruction (1999); Colonoscopy (1. 1999  2. 11/04); Dexa (1. 1997-1998   2. 9/04  3. 3/08 ); Esophagogastroduodenoscopy; Hemorrhoid procedure (07/2006); DG KNEE 1-2 VIEWS BILAT; US  ECHOCARDIOGRAPHY (07/2011); barium enema (2012); Total shoulder arthroplasty (Left, 11/27/2015); Total shoulder revision (Left, 03/16/2016); Colonoscopy with propofol  (N/A, 09/10/2019); Flexible sigmoidoscopy (N/A, 01/16/2021); XI robotic assisted lower anterior resection (N/A, 05/26/2021); Rectopexy (N/A, 05/26/2021); Proctoscopy (N/A, 05/26/2021); TEE without cardioversion (N/A, 07/27/2021); Bubble study (07/27/2021); PICC LINE REMOVAL ([facility] HX) (08/28/2021); Cardioversion (N/A, 11/19/2021); and Joint replacement. Prior to Admission medications   Medication Sig Start Date End Date Taking? Authorizing Provider  apixaban  (ELIQUIS ) 5 MG TABS tablet Take 1 tablet (5 mg total) by mouth 2 (two) times daily. 08/22/23  Yes Claire Crick, MD  Ascorbic Acid  (VITAMIN C PO) Take by mouth.   Yes [provider]  bimatoprost (LUMIGAN) 0.01 % SOLN Place 1 drop into both eyes at bedtime.   Yes [provider]  carvedilol  (COREG ) 3.125 MG tablet Take 1 tablet (3.125 mg total) by mouth 2 (two) times daily with a meal. 07/18/23  Yes Maudine Sos, MD  cetirizine  [facility] ALLERGY  RELIEF, CETIRIZINE ,) 10 MG tablet Take 1 tablet (10 mg total) by mouth daily. 06/28/23  Yes Kozlow, Rema Care, MD  cholecalciferol  (VITAMIN D ) 1000 units tablet Take 1,000 Units by mouth daily.   Yes [provider]  clonazePAM  (KLONOPIN ) 0.5 MG tablet TAKE 1 TABLET BY MOUTH AT BEDTIME 08/24/23  Yes Claire Crick, MD  colchicine  0.6 MG tablet Take 1 tablet (0.6 mg total) by mouth daily. 12/10/21  Yes Felicita Horns, FNP  diclofenac  Sodium (VOLTAREN ) 1 % GEL Apply 2 g topically 4 (four) times  daily.   Yes [provider]  DULoxetine  (CYMBALTA ) 20 MG capsule Take 1 capsule (20 mg total) by mouth daily. 08/13/22  Yes Claire Crick, MD  EPINEPHrine  (EPIPEN  2-PAK) 0.3 mg/0.3 mL IJ SOAJ injection Inject 0.3 mg into the muscle as needed for anaphylaxis. XOLAIR  INJECTION PROTOCOL 04/09/22  Yes Orelia Binet, MD  famotidine  (PEPCID ) 20 MG tablet Take 1 tablet (20 mg total) by mouth 2 (two) times daily. 04/22/23  Yes Claire Crick, MD  furosemide  (LASIX ) 20 MG tablet Take 1 tablet (20 mg total) by mouth daily. 08/22/23  Yes Claire Crick, MD  gabapentin  (NEURONTIN ) 300 MG capsule TAKE 1 CAPSULE BY MOUTH ONCE DAILY AS NEEDED FOR  NERVE  PAIN  (ITCHING) 04/22/23  Yes Claire Crick, MD  hydrALAZINE  (APRESOLINE ) 25 MG tablet Take 1 tablet (25 mg total) by mouth in the morning and at bedtime. 07/18/23  Yes Maudine Sos, MD  levothyroxine  (SYNTHROID ) 100 MCG tablet Take 100 mcg by mouth daily before breakfast. 09/26/23  Yes [provider]  Magnesium  250 MG CAPS Take 1 capsule by mouth at bedtime. 08/22/23  Yes Claire Crick, MD  mometasone  (ELOCON ) 0.1 % ointment Apply topically daily. Shower than ointment applied while still wet daily 06/23/23  Yes Claire Crick, MD  Multiple Vitamin (MULTIVITAMIN ADULT PO) Take by mouth daily.   Yes [provider]  polyethylene glycol (MIRALAX  / GLYCOLAX ) 17 g packet Take 17 g by mouth daily.   Yes [provider]  Simethicone  180 MG CAPS Take 1 capsule by mouth daily as needed (indigestion).   Yes [provider]  spironolactone  (ALDACTONE ) 25 MG tablet TAKE 1 AND 1/2 TABLETS DAILY 05/23/23  Yes Maudine Sos, MD  timolol (TIMOPTIC) 0.5 % ophthalmic solution Place 1 drop into both eyes every morning. 05/28/22  Yes [provider]   Allergies  Allergen Reactions   Ace Inhibitors Cough   Amlodipine  Nausea Only and Other (See Comments)    Stomach pains--"feel like my insides are on  fire"   Carvedilol  Other (See Comments)    fatigue   Chlorthalidone  Other (See Comments)    Severe hyponatremia   Montelukast  Other (See Comments)    Slurred speech; really drowsy   Other Other (See Comments)    No seeds or corn because of IBS   Risedronate Sodium Other (See Comments)    REACTION: joint pain   Simvastatin  Other (See Comments)    REACTION: the full 20 mg pill causes leg pain- can tol 10 mg   Prolia  [Denosumab ] Other (See Comments)    facial swelling, throat tightness, right greater than left foot swelling, right great toe numbness   Alendronate Sodium Palpitations   Influenza Vaccines Rash and Other (See Comments)    Led to mental breakdown in 1960s    Losartan  Other (See Comments)    Cr bumped   Paroxetine Hcl Other (See Comments)    Not effective - felt ill on this medicine   Silenor  [Doxepin  Hcl] Palpitations    Hallucinations and racing heart   Sulfonamide Derivatives Rash   Toprol  Xl [Metoprolol ] Diarrhea    Diarrhea and weakness    FAMILY HISTORY:  family history includes Breast cancer in her cousin; Coronary artery disease in her brother; Diabetes in her brother and paternal grandfather. SOCIAL HISTORY:  reports that she has never smoked. She has never used smokeless tobacco. She reports that she does not drink  alcohol and does not use drugs.   Review of Systems:  Gen:  Denies  fever, sweats, chills weight loss  HEENT: Denies blurred vision, double vision, ear pain, eye pain, hearing loss, nose bleeds, sore throat Cardiac:  No dizziness, chest pain or heaviness, chest tightness,edema, No JVD Resp:   No cough, -sputum production, -shortness of breath,-wheezing, -hemoptysis,  Gi: Denies swallowing difficulty, stomach pain, nausea or vomiting, diarrhea, constipation, bowel incontinence Gu:  Denies bladder incontinence, burning urine Ext:   Denies Joint pain, stiffness or swelling Skin: Denies  skin rash, easy bruising or bleeding or hives Endoc:   Denies polyuria, polydipsia , polyphagia or weight change Psych:   Denies depression, insomnia or hallucinations  Other:  All other systems negative  VITAL SIGNS: BP 108/70 (BP Location: Right Arm, Patient Position: Sitting, Cuff Size: Large)   Pulse 61   Temp (!) 97.5 F (36.4 C) (Oral)   Ht 5' (1.524 m)   Wt 174 lb (78.9 kg)   SpO2 98%   BMI 33.98 kg/m    Physical Examination:   General Appearance: No distress  EYES PERRLA, EOM intact.   NECK Supple, No JVD Pulmonary: normal breath sounds, No wheezing.  CardiovascularNormal S1,S2.  No m/r/g.   Abdomen: Benign, Soft, non-tender. Skin:   warm, no rashes, no ecchymosis  Extremities: normal, no cyanosis, clubbing. Neuro:without focal findings,  speech normal  PSYCHIATRIC: Mood, affect within normal limits.   ASSESSMENT AND PLAN  OSA Reviewed titration study results with patient. Will change pressure setting to Auto BIPAP set to min EPAP 4 cm H2O and max IPAP 18 cm H2O. Discussed the consequences of untreated sleep apnea. Advised not to drive drowsy for safety of patient and others. Will follow up in 6 weeks for compliance check. Will proceed with ASV titration study if AHI still elevated.   Atrial fibrillation Stable, on current management. Following with cardiology.  Hypothyroidism Stable, on current management.    Patient  satisfied with Plan of action and management. All questions answered  I spent a total of 37 minutes reviewing chart data, face-to-face evaluation with the patient, counseling and coordination of care as detailed above.    March Joos, M.D.  Sleep Medicine [facility]

## 2023-10-04 ENCOUNTER — Other Ambulatory Visit: Payer: Self-pay

## 2023-10-04 DIAGNOSIS — L299 Pruritus, unspecified: Secondary | ICD-10-CM

## 2023-10-04 NOTE — Telephone Encounter (Signed)
 Name of Medication: mometasone  (ELOCON ) 0.1 % ointment  Name of Pharmacy: [facility] or Written Date and Quantity: 06/23/23 45 G 1 rf  Last Office Visit and Type: 08/22/23 Next Office Visit and Type: 02/17/24

## 2023-10-05 MED ORDER — MOMETASONE FUROATE 0.1 % EX OINT: TOPICAL_OINTMENT | Freq: Every day | CUTANEOUS | 1 refills | Status: DC

## 2023-10-13 ENCOUNTER — Other Ambulatory Visit: Payer: Self-pay | Admitting: Family Medicine

## 2023-10-14 ENCOUNTER — Ambulatory Visit (HOSPITAL_BASED_OUTPATIENT_CLINIC_OR_DEPARTMENT_OTHER)

## 2023-10-14 ENCOUNTER — Other Ambulatory Visit (HOSPITAL_BASED_OUTPATIENT_CLINIC_OR_DEPARTMENT_OTHER)

## 2023-10-14 DIAGNOSIS — I10 Essential (primary) hypertension: Secondary | ICD-10-CM

## 2023-10-14 LAB — ECHOCARDIOGRAM COMPLETE
AR max vel: 1.16 cm2
AV Area VTI: 1.12 cm2
AV Area mean vel: 1.04 cm2
AV Mean grad: 16 mmHg
AV Peak grad: 25 mmHg
AV Vena cont: 0.19 cm
Ao pk vel: 2.5 m/s
Area-P 1/2: 3.97 cm2
MV M vel: 5.61 m/s
MV Peak grad: 125.9 mmHg
P 1/2 time: 545 ms
Radius: 0.9 cm
S' Lateral: 2.55 cm

## 2023-10-14 NOTE — Telephone Encounter (Signed)
 Per 09/14/23 pt msg, Dr Crissie Dome decreased strength to 88 mcg daily and sent rx, #90/1 refill to [facility].   Spoke with pt's daughter, Brooke Beasley (on dpr), asking about refill request for 100 mcg. Brooke Beasley confirms pt is taking levothyroxine  88 mcg and does not need any refills right now. States she will notify [facility].   Denied request.

## 2023-10-21 DIAGNOSIS — G4733 Obstructive sleep apnea (adult) (pediatric): Secondary | ICD-10-CM | POA: Diagnosis not present

## 2023-10-21 DIAGNOSIS — J9610 Chronic respiratory failure, unspecified whether with hypoxia or hypercapnia: Secondary | ICD-10-CM | POA: Diagnosis not present

## 2023-10-25 ENCOUNTER — Encounter (HOSPITAL_BASED_OUTPATIENT_CLINIC_OR_DEPARTMENT_OTHER): Payer: Self-pay | Admitting: Cardiovascular Disease

## 2023-10-25 DIAGNOSIS — I10 Essential (primary) hypertension: Secondary | ICD-10-CM

## 2023-10-25 NOTE — Telephone Encounter (Signed)
 Please review and advise.

## 2023-10-26 NOTE — Telephone Encounter (Signed)
 Let's increase the carvedilol  to 6.25 mg bid and continue with the extra 10 mg hydralazine  prn.  It might take a few days for the BP to decrease with the increased dose of carvedilol .

## 2023-10-31 ENCOUNTER — Encounter: Payer: Self-pay | Admitting: Sleep Medicine

## 2023-10-31 MED ORDER — CARVEDILOL 6.25 MG PO TABS: 6.2500 mg | ORAL_TABLET | Freq: Two times a day (BID) | ORAL | 1 refills | Status: AC

## 2023-11-07 ENCOUNTER — Ambulatory Visit: Admitting: Sleep Medicine

## 2023-11-07 ENCOUNTER — Encounter: Payer: Self-pay | Admitting: Sleep Medicine

## 2023-11-07 VITALS — BP 98/60 | HR 80 | Temp 97.9°F

## 2023-11-07 DIAGNOSIS — I4891 Unspecified atrial fibrillation: Secondary | ICD-10-CM | POA: Diagnosis not present

## 2023-11-07 DIAGNOSIS — E039 Hypothyroidism, unspecified: Secondary | ICD-10-CM

## 2023-11-07 DIAGNOSIS — G4739 Other sleep apnea: Secondary | ICD-10-CM

## 2023-11-07 NOTE — Patient Instructions (Signed)
 Will complete ASV titration and follow up to review results.

## 2023-11-07 NOTE — Progress Notes (Unsigned)
 Name:Brooke Beasley MRN: [id number] DOB: 1931/01/31   CHIEF COMPLAINT:  PAP F/U   HISTORY OF PRESENT ILLNESS:  Brooke Beasley is a 89 y.o. w/ a h/o who present for c/o loud snoring and excessive daytime sleepiness which has been present for several years. Reports nocturnal awakenings due to unclear reasons, however does not have difficulty falling back to sleep. Denies any significant weight changes. Denies morning headaches, RLS symptoms, dream enactment, cataplexy, hypnagogic or hypnapompic hallucinations. Reports a family history of sleep apnea. Denies drowsy driving. Drinks 1-2 sodas daily, occasional alcohol use, former smoker, denies illicit drug use.   Bedtime 11 pm Sleep onset 10 mins Rise time 6:30-7:30 am   EPWORTH SLEEP SCORE    11/13/2021   11:00 AM  Results of the Epworth flowsheet  Sitting and reading 3  Watching TV 2  Sitting, inactive in a public place (e.g. a theatre or a meeting) 0  As a passenger in a car for an hour without a break 1  Lying down to rest in the afternoon when circumstances permit 3  Sitting and talking to someone 0  Sitting quietly after a lunch without alcohol 2  In a car, while stopped for a few minutes in traffic 0  Total score 11     PAST MEDICAL HISTORY :   has a past medical history of Allergy  (1965), Anxiety (1965), Arrhythmia, Basal cell carcinoma, arm, left (02/2023), C. difficile diarrhea (07/11/2018), CAP (community acquired pneumonia) (12/22/2017), Carotid stenosis (10/18/2014), CKD (chronic kidney disease) stage 3, GFR 30-59 ml/min (HCC) (08/02/2014), Closed fracture of right orbit (HCC) (02/10/2021), Clostridioides difficile infection, COVID-19 virus infection (10/13/2020), Degenerative disc disease, Depression (1965), Diverticulosis, Family history of adverse reaction to anesthesia, GERD (gastroesophageal reflux disease) (1980), Glaucoma, Heart murmur (5/13), History of hiatal hernia, History of shingles, HTN  (hypertension), Hyperlipidemia, Hypertension, Hypothyroidism, IBS (11/02/2006), Influenza A (04/20/2022), Intestinal bacterial overgrowth, Left shoulder pain (11/04/2014), Lower GI bleed (12/2020), Maxillary fracture (HCC) (04/08/2012), Osteoarthritis (2016), Osteoporosis, Perirectal abscess (08/03/2021), Pseudogout (2016), and Strep pharyngitis (04/20/2022).  has a past surgical history that includes Tonsillectomy; Bowel obstruction (1999); Colonoscopy (1. 1999  2. 11/04); Dexa (1. 1997-1998   2. 9/04  3. 3/08 ); Esophagogastroduodenoscopy; Hemorrhoid procedure (07/2006); DG KNEE 1-2 VIEWS BILAT; US  ECHOCARDIOGRAPHY (07/2011); barium enema (2012); Total shoulder arthroplasty (Left, 11/27/2015); Total shoulder revision (Left, 03/16/2016); Colonoscopy with propofol  (N/A, 09/10/2019); Flexible sigmoidoscopy (N/A, 01/16/2021); XI robotic assisted lower anterior resection (N/A, 05/26/2021); Rectopexy (N/A, 05/26/2021); Proctoscopy (N/A, 05/26/2021); TEE without cardioversion (N/A, 07/27/2021); Bubble study (07/27/2021); PICC LINE REMOVAL ([facility] HX) (08/28/2021); Cardioversion (N/A, 11/19/2021); and Joint replacement. Prior to Admission medications   Medication Sig Start Date End Date Taking? Authorizing Provider  apixaban  (ELIQUIS ) 5 MG TABS tablet Take 1 tablet (5 mg total) by mouth 2 (two) times daily. 08/22/23  Yes Rilla Baller, MD  Ascorbic Acid  (VITAMIN C PO) Take by mouth.   Yes [provider]  bimatoprost (LUMIGAN) 0.01 % SOLN Place 1 drop into both eyes at bedtime.   Yes [provider]  carvedilol  (COREG ) 6.25 MG tablet Take 1 tablet (6.25 mg total) by mouth 2 (two) times daily with a meal. 10/31/23  Yes Walker, Caitlin S, NP  cetirizine  (ZYRTEC ) 10 MG tablet Take 1 tablet by mouth once daily 09/26/23  Yes Kozlow, Eric J, MD  cholecalciferol  (VITAMIN D ) 1000 units tablet Take 1,000 Units by mouth daily.   Yes [provider]  clonazePAM  (KLONOPIN ) 0.5 MG tablet TAKE 1  TABLET  BY MOUTH AT BEDTIME 08/24/23  Yes Rilla Baller, MD  colchicine  0.6 MG tablet Take 1 tablet (0.6 mg total) by mouth daily. 12/10/21  Yes Dugal, Ginger, FNP  diclofenac  Sodium (VOLTAREN ) 1 % GEL Apply 2 g topically 4 (four) times daily.   Yes [provider]  DULoxetine  (CYMBALTA ) 20 MG capsule Take 1 capsule by mouth once daily 09/26/23  Yes Rilla Baller, MD  EPINEPHrine  (EPIPEN  2-PAK) 0.3 mg/0.3 mL IJ SOAJ injection Inject 0.3 mg into the muscle as needed for anaphylaxis. XOLAIR  INJECTION PROTOCOL 04/09/22  Yes Lorin Norris, MD  famotidine  (PEPCID ) 20 MG tablet Take 1 tablet (20 mg total) by mouth 2 (two) times daily. 04/22/23  Yes Rilla Baller, MD  furosemide  (LASIX ) 20 MG tablet Take 1 tablet (20 mg total) by mouth daily. 08/22/23  Yes Rilla Baller, MD  gabapentin  (NEURONTIN ) 300 MG capsule TAKE 1 CAPSULE BY MOUTH ONCE DAILY AS NEEDED FOR  NERVE  PAIN  (ITCHING) 04/22/23  Yes Rilla Baller, MD  hydrALAZINE  (APRESOLINE ) 25 MG tablet Take 1 tablet (25 mg total) by mouth in the morning and at bedtime. 07/18/23  Yes Raford Riggs, MD  levothyroxine  (SYNTHROID ) 100 MCG tablet Take 100 mcg by mouth daily before breakfast. 09/26/23  Yes [provider]  levothyroxine  (SYNTHROID ) 88 MCG tablet Take 88 mcg by mouth daily. 09/27/23  Yes [provider]  Magnesium  250 MG CAPS Take 1 capsule by mouth at bedtime. 08/22/23  Yes Rilla Baller, MD  mometasone  (ELOCON ) 0.1 % ointment Apply topically daily. Shower than ointment applied while still wet daily 10/05/23  Yes Rilla Baller, MD  Multiple Vitamin (MULTIVITAMIN ADULT PO) Take by mouth daily.   Yes [provider]  polyethylene glycol (MIRALAX  / GLYCOLAX ) 17 g packet Take 17 g by mouth daily.   Yes [provider]  Simethicone  180 MG CAPS Take 1 capsule by mouth daily as needed (indigestion).   Yes [provider]  spironolactone  (ALDACTONE ) 25 MG tablet TAKE 1 AND 1/2 TABLETS  DAILY 05/23/23  Yes Raford Riggs, MD  timolol (TIMOPTIC) 0.5 % ophthalmic solution Place 1 drop into both eyes every morning. 05/28/22  Yes [provider]   Allergies  Allergen Reactions   Ace Inhibitors Cough   Amlodipine  Nausea Only and Other (See Comments)    Stomach pains--feel like my insides are on fire   Carvedilol  Other (See Comments)    fatigue   Chlorthalidone  Other (See Comments)    Severe hyponatremia   Montelukast  Other (See Comments)    Slurred speech; really drowsy   Other Other (See Comments)    No seeds or corn because of IBS   Risedronate Sodium Other (See Comments)    REACTION: joint pain   Simvastatin  Other (See Comments)    REACTION: the full 20 mg pill causes leg pain- can tol 10 mg   Prolia  [Denosumab ] Other (See Comments)    facial swelling, throat tightness, right greater than left foot swelling, right great toe numbness   Alendronate Sodium Palpitations   Influenza Vaccines Rash and Other (See Comments)    Led to mental breakdown in 1960s    Losartan  Other (See Comments)    Cr bumped   Paroxetine Hcl Other (See Comments)    Not effective - felt ill on this medicine   Silenor  [Doxepin  Hcl] Palpitations    Hallucinations and racing heart   Sulfonamide Derivatives Rash   Toprol  Xl [Metoprolol ] Diarrhea    Diarrhea and weakness  FAMILY HISTORY:  family history includes Breast cancer in her cousin; Coronary artery disease in her brother; Diabetes in her brother and paternal grandfather. SOCIAL HISTORY:  reports that she has never smoked. She has never used smokeless tobacco. She reports that she does not drink alcohol and does not use drugs.   Review of Systems:  Gen:  Denies  fever, sweats, chills weight loss  HEENT: Denies blurred vision, double vision, ear pain, eye pain, hearing loss, nose bleeds, sore throat Cardiac:  No dizziness, chest pain or heaviness, chest tightness,edema, No JVD Resp:   No cough, -sputum production,  -shortness of breath,-wheezing, -hemoptysis,  Gi: Denies swallowing difficulty, stomach pain, nausea or vomiting, diarrhea, constipation, bowel incontinence Gu:  Denies bladder incontinence, burning urine Ext:   Denies Joint pain, stiffness or swelling Skin: Denies  skin rash, easy bruising or bleeding or hives Endoc:  Denies polyuria, polydipsia , polyphagia or weight change Psych:   Denies depression, insomnia or hallucinations  Other:  All other systems negative  VITAL SIGNS: BP 98/60 (BP Location: Right Arm, Patient Position: Sitting, Cuff Size: Large)   Pulse 80   Temp 97.9 F (36.6 C) (Oral)   SpO2 94%    Physical Examination:   General Appearance: No distress  EYES PERRLA, EOM intact.   NECK Supple, No JVD Pulmonary: normal breath sounds, No wheezing.  CardiovascularNormal S1,S2.  No m/r/g.   Abdomen: Benign, Soft, non-tender. Skin:   warm, no rashes, no ecchymosis  Extremities: normal, no cyanosis, clubbing. Neuro:without focal findings,  speech normal  PSYCHIATRIC: Mood, affect within normal limits.   ASSESSMENT AND PLAN  OSA I suspect that OSA is likely present due to clinical presentation. Discussed the consequences of untreated sleep apnea. Advised not to drive drowsy for safety of patient and others. Will complete further evaluation with a home sleep study and follow up to review results.    HTN Stable, on current management. Following with PCP.    MEDICATION ADJUSTMENTS/LABS AND TESTS ORDERED: Recommend Sleep Study   Patient  satisfied with Plan of action and management. All questions answered  Follow up to review HST results and treatment plan.   I spent a total of  *** minutes reviewing chart data, face-to-face evaluation with the patient, counseling and coordination of care as detailed above.    Salvador Coupe, M.D.  Sleep Medicine [facility]

## 2023-11-14 ENCOUNTER — Ambulatory Visit: Payer: Self-pay

## 2023-11-14 NOTE — Telephone Encounter (Signed)
 FYI Only or Action Required?: Action required by provider: clinical question for provider.  Patient is followed in Pulmonology for OSA, last seen on 11/07/2023 by Reddy, Pallavi D, MD.  Called Nurse Triage reporting No chief complaint on file..  Symptoms began yesterday.  Interventions attempted: Nothing.  Symptoms are: stable.  Triage Disposition: No disposition on file.  Patient/caregiver understands and will follow disposition?:    Copied from CRM [id number]. Topic: Clinical - Red Word Triage >> Nov 14, 2023  9:23 AM Russell PARAS wrote: Red Word that prompted transfer to Nurse Triage:   Pt's daughter, [name], is contacting clinic due to issues with CPAP Machine has been cutting off during the night since 07/14, when taking mask off However, machine cut off on its own without removing mask, occurred 4 times.  Has had machine for 2 years, was used when purchased.   Follows with Jess at [place]. Reason for Disposition  Health information question, no triage required and triager able to answer question  Answer Assessment - Initial Assessment Questions 1. REASON FOR CALL: What is the main reason for your call? or How can I best help you?     Can we bring the machine in and drop it off at Dr. Chase office?  2. SYMPTOMS : Do you have any symptoms?      The CPAP machine cut off four times during the night.   3. OTHER QUESTIONS: Do you have any other questions?     No  Instructed to call the supplier of the CPAP machine.  Protocols used: Information Only Call - No Triage-A-AH

## 2023-11-14 NOTE — Telephone Encounter (Signed)
 I spoke with the patient. She will reach out to [employer] about the machine. She did ask if she was suppose to be getting a new machine. She said she thought you told her she would get a new machine after he sleep study?

## 2023-11-15 NOTE — Telephone Encounter (Signed)
 I have notified the patient. Nothing further needed.

## 2023-11-20 ENCOUNTER — Encounter (HOSPITAL_BASED_OUTPATIENT_CLINIC_OR_DEPARTMENT_OTHER): Admitting: Internal Medicine

## 2023-11-20 ENCOUNTER — Ambulatory Visit (HOSPITAL_BASED_OUTPATIENT_CLINIC_OR_DEPARTMENT_OTHER): Attending: Sleep Medicine | Admitting: Internal Medicine

## 2023-11-20 DIAGNOSIS — G4739 Other sleep apnea: Secondary | ICD-10-CM | POA: Diagnosis not present

## 2023-11-22 ENCOUNTER — Encounter (HOSPITAL_BASED_OUTPATIENT_CLINIC_OR_DEPARTMENT_OTHER): Admitting: Internal Medicine

## 2023-11-24 ENCOUNTER — Other Ambulatory Visit: Payer: Self-pay

## 2023-11-24 DIAGNOSIS — L299 Pruritus, unspecified: Secondary | ICD-10-CM

## 2023-11-24 NOTE — Telephone Encounter (Signed)
 Copied from CRM #[id number]. Topic: Clinical - Prescription Issue >> Nov 24, 2023 10:27 AM Pinkey ORN wrote: Reason for CRM: mometasone  (EL[facility] ) 0.1 % ointment >> Nov 24, 2023 10:29 AM Pinkey ORN wrote: [facility] [phone] , called on behalf of patient states there's no quantity remaining on the medication, therefore pharmacy is needing a new order.

## 2023-11-25 NOTE — Telephone Encounter (Signed)
 Sig says apply topically daily but they need an amount pt is going to apply daily. Please review. Med pending

## 2023-11-25 NOTE — Addendum Note (Signed)
 Addended by: RAYLEEN GLENDORA HERO on: 11/25/2023 03:23 PM   Modules accepted: Orders

## 2023-11-27 MED ORDER — MOMETASONE FUROATE 0.1 % EX OINT: TOPICAL_OINTMENT | CUTANEOUS | 1 refills | Status: AC

## 2023-11-27 NOTE — Telephone Encounter (Signed)
 ERx

## 2023-11-29 ENCOUNTER — Telehealth: Payer: Self-pay

## 2023-11-29 DIAGNOSIS — G4739 Other sleep apnea: Secondary | ICD-10-CM

## 2023-11-29 NOTE — Telephone Encounter (Signed)
 Copied from CRM #[id number]. Topic: Clinical - Lab/Test Results >> Nov 29, 2023 11:07 AM Brooke Beasley wrote: Reason for CRM: PT CHECKING THE RESULTS OF HER SLEEP STUDY ADV PROVIDER HAS NOT READ/SEENREPORT

## 2023-12-06 ENCOUNTER — Other Ambulatory Visit (INDEPENDENT_AMBULATORY_CARE_PROVIDER_SITE_OTHER)

## 2023-12-06 ENCOUNTER — Telehealth: Payer: Self-pay

## 2023-12-06 DIAGNOSIS — E039 Hypothyroidism, unspecified: Secondary | ICD-10-CM | POA: Diagnosis not present

## 2023-12-06 NOTE — Telephone Encounter (Signed)
 Copied from CRM [id number]. Topic: Clinical - Request for Lab/Test Order >> Dec 06, 2023  9:11 AM Ivette P wrote: Reason for CRM: Pt called in to see if she can get a test to check her Thyroid , pt got it checked last in may by the heart doctor and was told to follow up in a few months.   Pt wants to know if the medication she was prescribed is working.   Pls follow up with any questions. [phone]

## 2023-12-06 NOTE — Telephone Encounter (Signed)
 Per 09/14/23 pt msg, Dr KANDICE asked pt to come back in 4-6 wks for labs to recheck thyroid  function (labs already ordered).   Spoke with pt relaying info above and scheduled lab visit today at 4:00.

## 2023-12-07 LAB — TSH: TSH: 1.81 u[IU]/mL (ref 0.35–5.50)

## 2023-12-08 ENCOUNTER — Ambulatory Visit: Payer: Self-pay | Admitting: Family Medicine

## 2023-12-08 NOTE — Telephone Encounter (Signed)
 Patient did Sleep Study on 7/27 in [place]. Will you let us  know once the results have been read?

## 2023-12-08 NOTE — Telephone Encounter (Signed)
 I did notify the patient Dr. Neysa is out of the office until Tuesday.

## 2023-12-08 NOTE — Telephone Encounter (Signed)
 Copied from CRM [id number]. Topic: Clinical - Lab/Test Results >> Dec 07, 2023  9:49 AM Celestine FALCON wrote: Reason for CRM: Pt is calling back to get the results from her CPAP titration study. The results aren't released yet.   Dr. Jess stated Since patient completed study in [place], this will be read by Dr. Neysa. Can you please contact Dr. Saundra office regarding this?  I am sending over a CRM to the [facility] clinical pool to obtain the results for the pt once they are in, and requesting a call for the patient at [phone] ok to leave a vm.

## 2023-12-10 NOTE — Procedures (Signed)
  Indications for Polysomnography The patient is a 89 year-old Female who is 5' and weighs 174.0 lbs. Her BMI equals 34.2.  A full night titration treatment study was performed.  MedicationMagnesiumClonazepam Polysomnogram Data A full night polysomnogram recorded the standard physiologic parameters including EEG, EOG, EMG, EKG, nasal and oral airflow.  Respiratory parameters of chest and abdominal movements were recorded with Respiratory Inductance Plethysmography belts.   Oxygen  saturation was recorded by pulse oximetry.  Sleep Architecture The total recording time of the polysomnogram was 418.8 minutes.  The total sleep time was 321.0 minutes.  The patient spent 21.0% of total sleep time in Stage N1, 70.2% in Stage N2, 1.1% in Stages N3, and 7.6% in REM.  Sleep latency was 17.9 minutes.   REM latency was 270.0 minutes.  Sleep Efficiency was 76.6%.  Wake after Sleep Onset time was 79.5 minutes.  Titration Summary The patient was titrated at pressures ranging from 15/5/2/0.3/333/4** cm/H20 with supplemental oxygen  at - up to 15/3/10* cm/H20 with supplemental oxygen  at -.  The last pressure used in the study was 10/27/16* cm/H20 with supplemental oxygen  at -.  Respiratory Events The polysomnogram revealed a presence of 5 obstructive, - central, and 1 mixed apneas resulting in an Apnea index of 1.1 events per hour.  There were 106 hypopneas (GreaterEqual to3% desaturation and/or arousal) resulting in an Apnea\Hypopnea Index (AHI  GreaterEqual to3% desaturation and/or arousal) of 20.9 events per hour.  There were 70 hypopneas (GreaterEqual to4% desaturation) resulting in an Apnea\Hypopnea Index (AHI GreaterEqual to4% desaturation) of 14.2 events per hour.  There were 15  Respiratory Effort Related Arousals resulting in a RERA index of 2.8 events per hour. The Respiratory Disturbance Index is 23.7 events per hour.  The snore index was - events per hour.  Mean oxygen  saturation was 92.9%.  The lowest  oxygen  saturation during sleep was 80.0%.  Time spent LessEqual to88% oxygen  saturation was  minutes ().  Limb Activity There were 76 limb movements recorded.  Of this total, 65 were classified as PLMs.  Of the PLMs, 6 were associated with arousals.  The Limb Movement index was 14.2 per hour while the PLM index was 12.1 per hour.  Cardiac Summary The average pulse rate was 56.8 bpm.  The minimum pulse rate was 42.0 bpm while the maximum pulse rate was 82.0 bpm.  Cardiac rhythm was normal/abnormal.  Comments:  Diagnosis:  Recommendations:   This study was personally reviewed and electronically signed by: Jess Cue Accredited Board Certified in Sleep Medicine Date/Time:

## 2023-12-10 NOTE — Procedures (Signed)
 [facility] [address] [place], [place] [address] Tel: [phone]   Fax: [phone]  Titration Interpretation  Patient Name:  Brooke, Beasley Study Date:  11/20/2023 Referring Physician:  DEVONA REDDY [phone]) %%startinterp%% Indications for Polysomnography The patient is a 89 year-old Female who is 5' and weighs 174.0 lbs. Her BMI equals  34.2.  A full night titration treatment study was performed for management evaluation of ASV titration for Complex sleep apnea.  Medication  Magnesium   Clonazepam    Polysomnogram Data A full night polysomnogram recorded the standard physiologic parameters including EEG, EOG, EMG, EKG, nasal and oral airflow.  Respiratory parameters of chest and abdominal movements were recorded with Respiratory Inductance Plethysmography belts.  Oxygen  saturation was recorded by pulse oximetry.   Sleep Architecture The total recording time of the polysomnogram was 418.8 minutes.  The total sleep time was 321.0 minutes.  The patient spent 21.0% of total sleep time in Stage N1, 70.2% in Stage N2, 1.1% in Stages N3, and 7.6% in REM.  Sleep latency was 17.9 minutes.  REM latency was 270.0 minutes.  Sleep Efficiency was 76.6%.  Wake after Sleep Onset time was 79.5 minutes.  Titration Summary The patient was titrated at pressures ranging from 15/5/2/0.3/333/4** cm/H20 with supplemental oxygen  at - up to 15/3/10* cm/H20 with supplemental oxygen  at -.  The last pressure used in the study was 10/27/16* cm/H20 with supplemental oxygen  at -.  Respiratory Events The polysomnogram revealed a presence of 5 obstructive, - central, and 1 mixed apneas resulting in an Apnea index of 1.1 events per hour.  There were 106 hypopneas (>=3% desaturation and/or arousal) resulting in an Apnea\Hypopnea Index (AHI >=3% desaturation and/or arousal) of 20.9 events per hour.  There were 70 hypopneas (>=4% desaturation) resulting in an Apnea\Hypopnea  Index (AHI >=4% desaturation) of 14.2 events per hour.  There were 15 Respiratory Effort Related Arousals resulting in a RERA index of 2.8 events per hour. The Respiratory Disturbance Index is 23.7 events per hour.  The snore index was - events per hour.  Mean oxygen  saturation was 92.9%.  The lowest oxygen  saturation during sleep was 80.0%.  Time spent <=88% oxygen  saturation was 29.6 minutes (7.1%).  Limb Activity There were 76 limb movements recorded.  Of this total, 65 were classified as PLMs.  Of the PLMs, 6 were associated with arousals.  The Limb Movement index was 14.2 per hour while the PLM index was 12.1 per hour.  Cardiac Summary The average pulse rate was 56.8 bpm.  The minimum pulse rate was 42.0 bpm while the maximum pulse rate was 82.0 bpm.  Cardiac rhythm was abnormal- atrial fibrillation.  Comments: Difficult titration- please see titration table and tech comments at end of this report.  Diagnosis: Complex sleep apnea  Recommendations: Suggest trial of ASV EPAP 7, min PS 4, max PS 18. Patient wore a medium ResMed AirTouch full-face mask with heated humidification.   This study was personally reviewed and electronically signed by: Reddy, Pallavi Accredited Board Certified in Sleep Medicine Date/Time: 12/10/23  1:37    %%endinterp%%  Titration Report  Patient Name: Brooke, Beasley Study Date: 11/20/2023  Date of Birth: 02/15/1931 Study Type: CPAP Titration  Age: 18 year MRN #: [id number]  Sex: Female Interpreting Physician: JESS DEVONA, [id number]  Height: 5' Referring Physician: DEVONA JESS ([id number])  Weight: 174.0 lbs Recording Tech: Shawnee Heugly RPSGT RST  BMI: 34.2 Scoring Tech: Shawnee Heugly RPSGT RST  ESS: 2 Neck Size:  16  Mask Type ResMed AirTouch FFM Final Pressure: EPAP 4, min PS 4, max PS 18  Mask Size: medium Supplemental O2: n/a   Study Overview  Lights Off: 09:52:38 PM  Count Index  Lights On: 04:51:28 AM Awakenings: 97 18.1  Time in Bed: 418.8  min. Arousals: 142 26.5  Total Sleep Time: 321.0 min. AHI (>=3% Desat and/or Ar.): 112 20.9   Sleep Efficiency: 76.6% AHI (>=4% Desat): 76 14.2   Sleep Latency: 17.9 min. Limb Movements: 76 14.2  Wake After Sleep Onset: 79.5 min. Snore: - -  REM Latency from Sleep Onset: 270.0 min. Desaturations: 156 29.2     Minimum SpO2 TST: 80.0%    Sleep Architecture  % of Time in Bed Stages Time (mins) % Sleep Time  Wake 98.0   Stage N1 67.5 21.0%  Stage N2 225.5 70.2%  Stage N3 3.5 1.1%  REM 24.5 7.6%   Arousal Summary   NREM REM Sleep Index  Respiratory Arousals 76 - 76 14.2  PLM Arousals 6 - 6 1.1  Isolated Limb Movement Arousals - - - -  Snore Arousals - - - -  Spontaneous Arousals 59 2 61 11.4  Total 140 2 142 26.5   Limb Movement Summary   Count Index  Isolated Limb Movements 11 2.1  Periodic Limb Movements (PLMs) 65 12.1  Total Limb Movements 76 14.2    Respiratory Summary   By Sleep Stage By Body Position Total   NREM REM Supine Non-Supine   Time (min) 296.5 24.5 153.5 167.5 321.0         Obstructive Apnea 5 - 5 - 5  Mixed Apnea 1 - 1 - 1  Central Apnea - - - - -  Total Apneas 6 - 6 - 6  Total Apnea Index 1.2 - 2.3 - 1.1         Hypopneas (>=3% Desat and/or Ar.) 99 7 87 19 106  AHI (>=3% Desat and/or Ar.) 21.2 17.1 36.4 6.8 20.9         Hypopneas (>=4% Desat) 64 6 55 15 70  AHI (>=4% Desat) 14.2 14.7 23.8 5.4 14.2          RERAs 15 - 4 11 15   RERA Index 3.0 - 1.6 3.9 2.8         RDI 24.3 17.1 37.9 10.7 23.7     Respiratory Event Durations   Apnea Hypopnea   NREM REM NREM REM  Average (seconds) 39.5 - 35.9 38.5  Maximum (seconds) 51.2 - 75.2 62.4    Oxygen  Saturation Summary   Wake NREM REM TST TIB  Average SpO2 93.3% 92.9% 91.5% 92.8% 92.9%  Minimum SpO2 84.0% 80.0% 83.0% 80.0% 80.0%  Maximum SpO2 98.0% 98.0% 97.0% 98.0% 98.0%   Oxygen  Saturation Distribution  Range (%) Time in range (min) Time in range (%)  90.0 - 100.0 353.8 84.5%  80.0 -  90.0 64.6 15.4%  70.0 - 80.0 0.3 0.1%  60.0 - 70.0 - -  50.0 - 60.0 - -  0.0 - 50.0 - -  Time Spent <=88% SpO2  Range (%) Time in range (min) Time in range (%)  0.0 - 88.0 29.6 7.1%      Count Index  Desaturations 156 29.2    Cardiac Summary   Wake NREM REM Sleep Total  Average Pulse Rate (BPM) 58.9 55.6 63.0 56.2 56.8  Minimum Pulse Rate (BPM) 43.0 42.0 48.0 42.0 42.0  Maximum Pulse Rate (BPM) 82.0 78.0 82.0 82.0  82.0   Pulse Rate Distribution:  Range (bpm) Time in range (min) Time in range (%)  0.0 - 40.0 - -  40.0 - 60.0 334.5 79.8%  60.0 - 80.0 84.2 20.1%  80.0 - 100.0 0.3 0.1%  100.0 - 120.0 - -  120.0 - 140.0 - -  140.0 - 200.0 - -   Titration Summary  PAP Device PAP Level O2 Level Time (min) Wake (min) NREM (min) REM (min) Sleep Eff% OA# CA# MA# Hyp# (>=3%) AHI (>=3%) Hyp# (>=4%) AHI (>=%4) RERA RDI SpO2 <=88% (min) Min SpO2 Mean SpO2 Ar. Index  Auto SV 15/5/2/0.3/333/4 - 20.5 5.5 15.0 0.0 73.2% - - - 5 20.0 3  12.0 1  24.0  0.2 87.0 94.0 44.0  Auto SV 15/5/2/0.3/333/6 - 43.5 3.5 40.0 0.0 92.0% 3 - - 17 30.0 11  21.0 3  34.5  4.6 80.0 93.1 34.5  ASV 08/26/13 - 44.5 22.5 22.0 0.0 49.4% - - - 9 24.5 7  19.1 -  24.5  0.8 85.0 92.1 19.1  ASV 09/26/13 - 61.0 14.5 46.5 0.0 76.2% - - - 32 41.3 27  34.8 -  41.3  9.9 80.0 91.6 41.3  ASV 09/26/17 - 7.0 1.5 5.5 0.0 78.6% - - - 4 43.6 2  21.8 -  43.6  2.0 80.0 90.0 43.6  ASV 09/27/13 - 27.5 1.5 13.0 13.0 94.5% - - - 8 18.5 7  16.2 -  18.5  3.5 83.0 91.6 6.9  ASV 09/27/16 - 97.0 2.5 83.0 11.5 97.4% - - - 3 1.9 1  0.6 9  7.6  0.3 88.0 93.4 13.3  ASV 09/27/17 - 12.0 6.0 6.0 0.0 50.0% - - - - - -  - -  -  1.0 83.0 92.2 50.0  ASV 09/28/17 - 20.0 9.0 11.0 0.0 55.0% - - - 5 27.3 1  5.5 -  27.3  2.0 84.0 91.9 54.5  ASV 10/27/13 - 4.5 3.0 1.5 0.0 33.3% - - - 1 40.0 1  40.0 -  40.0  0.3 86.0 91.7 40.0  ASV 10/27/16 - 26.0 6.5 19.5 0.0 75.0% - - - 1 3.1 1  3.1 2  9.2  0.8 85.0 92.2 15.4  ASV 01/26/14.5 - 10.0 3.5 6.5 0.0 65.0% - - - 6 55.4 1  9.2 -  55.4   0.3 88.0 93.3 27.7  ASV 02/26/13 - 0.5 0.0 0.5 0.0 100.0% - - - - - -  - -  -  0.1 88.0 91.9 120.0  ASV 02/27/13 - 18.5 6.5 12.0 0.0 64.9% - - - 13 65.0 6  30.0 -  65.0  0.7 85.0 93.8 55.0  ASV 15/3/10 - 26.5 12.0 14.5 0.0 54.7% 2 - 1 2 20.7 2  20.7 -  20.7  0.0 88.0 94.3 20.7    Hypnograms                           Technologist Comments  The patient is a 89 year old female with a history of complex sleep apnea referred for ASV titration.  Orders state ASV titration study only!  The patient has been using Auto BiPAP with full face mask at home.  She is accompanied by her daughter who stayed with her throughout the night.  The patient removed her dentures and has a difficult to fit facial structure along her jawline.  She also mentions that she talks in her sleep which often breaks the seal on  her mask.  She was fit for a ResMed F40 sm/wide mask which she liked but it did have to be swapped out for the ResMed AirTouch medium FFM that she has been using that they brought from home.  She took Clonazepam  .5 mg and Magnesium  250 mg prior to bedtime as she usually does.  The patient's EEG appeared to be within normal limits.  Sleep architecture was extremely fragmented due to respiratory disturbance.  She slept in the supine position for the first 2/3 of the study and slept on her left side when she was asked where she stayed for the rest of the study.  Occasional periodic limb movements were observed.  Snoring was noted occasionally and was soft to moderate.  The patient had severe complex sleep apnea with obstructive, mixed, and central events.  Most events were hypopnea in nature on ASV.  Several pressures were trialed while the patient was supine up to an EPAP of 15 cm H2O and Auto ASV for trial.  No therapeutic pressure was found while the patient was supine.  She would have arousals after respiratory events which would prompt the machine to trigger higher air pressures and she  would shake her head from side to side every time while supine.  She did not remember doing this.  When the patient was on the left side, her events were much easier to manage and a final ASV pressure of EPAP 7, min PS 4, max PS 18 significantly reduced the AHI and sleep continuity was improved, REM sleep also recorded.  The EEG appeared irregular at times with PVCs and possible flutter noted.    Despite a very difficult titration, the patient reported feeling like she slept much better than she usually does on ASV.                         Reggy Salt Diplomate, Biomedical engineer of Sleep Medicine  ELECTRONICALLY SIGNED ON:  12/10/2023, 1:16 PM [facility] PH: [phone]   FX: [phone] ACCREDITED BY THE [employer]ICAN ACADEMY OF SLEEP MEDICINE

## 2023-12-12 NOTE — Addendum Note (Signed)
 Addended by: VICCI EVALENE DEL on: 12/12/2023 05:05 PM   Modules accepted: Orders

## 2023-12-12 NOTE — Telephone Encounter (Signed)
 Per Dr. Jess- patient will need a ASV EPAP 7, min PS 4, max PS 18.  I have notified the patient and placed the order to [facility].  Nothing further needed.

## 2023-12-12 NOTE — Telephone Encounter (Addendum)
 Sleep study has been scanned in.  Dr. Jess please advise.

## 2023-12-18 ENCOUNTER — Other Ambulatory Visit: Payer: Self-pay | Admitting: Family Medicine

## 2023-12-18 DIAGNOSIS — F418 Other specified anxiety disorders: Secondary | ICD-10-CM

## 2023-12-19 NOTE — Telephone Encounter (Signed)
 Name of Medication:  Clonazepam  Name of Pharmacy:  [facility] Last Fill or Written Date and Quantity:  11/19/23, #30 Last Office Visit and Type:  08/22/23, CPE Next Office Visit and Type:  02/17/24, 6 mo f/u Last Controlled Substance Agreement Date:  09/26/14 Last UDS:  09/26/14

## 2023-12-20 NOTE — Telephone Encounter (Signed)
 ERx

## 2023-12-21 ENCOUNTER — Other Ambulatory Visit: Payer: Self-pay

## 2023-12-21 ENCOUNTER — Other Ambulatory Visit: Payer: Self-pay | Admitting: Family Medicine

## 2023-12-21 NOTE — Telephone Encounter (Signed)
 Copied from CRM [id number]. Topic: Clinical - Medication Refill >> Dec 21, 2023 10:17 AM Jayma L wrote: Medication: levothyroxine  (SYNTHROID ) 88 MCG tablet  Has the patient contacted their pharmacy? Yes (Agent: If no, request that the patient contact the pharmacy for the refill. If patient does not wish to contact the pharmacy document the reason why and proceed with request.) (Agent: If yes, when and what did the pharmacy advise?)  This is the patient's preferred pharmacy:  [facility], [place] - [address] [place] [address] [place] [address] Phone: [phone] Fax: [phone]   Is this the correct pharmacy for this prescription? Yes If no, delete pharmacy and type the correct one.   Has the prescription been filled recently? No  Is the patient out of the medication? No  Has the patient been seen for an appointment in the last year OR does the patient have an upcoming appointment? Yes  Can we respond through MyChart? No  Agent: Please be advised that Rx refills may take up to 3 business days. We ask that you follow-up with your pharmacy.

## 2023-12-22 ENCOUNTER — Telehealth: Payer: Self-pay

## 2023-12-22 NOTE — Telephone Encounter (Signed)
 I received message from [place] with [facility], [place]   This order is in auth review with the patients insurance at this time.  I will note the account for the need for followup.

## 2023-12-22 NOTE — Telephone Encounter (Signed)
 Copied from CRM [id number]. Topic: Clinical - Order For Equipment >> Dec 22, 2023  9:22 AM Isabell A wrote: Reason for CRM: Patient states she called her insurance and was told her provider needs to send in a request for a new CPAP machine. Requesting a call back today for an update.  Callback number: [phone] or [phone]

## 2023-12-22 NOTE — Telephone Encounter (Signed)
 I have sent urgent message to [facility] about this issue

## 2023-12-28 DIAGNOSIS — G4731 Primary central sleep apnea: Secondary | ICD-10-CM | POA: Diagnosis not present

## 2024-01-09 DIAGNOSIS — J9610 Chronic respiratory failure, unspecified whether with hypoxia or hypercapnia: Secondary | ICD-10-CM | POA: Diagnosis not present

## 2024-01-14 ENCOUNTER — Other Ambulatory Visit: Payer: Self-pay | Admitting: Family Medicine

## 2024-01-14 DIAGNOSIS — F418 Other specified anxiety disorders: Secondary | ICD-10-CM

## 2024-01-16 NOTE — Telephone Encounter (Signed)
 Name of Medication:  Clonazepam  Name of Pharmacy:  [facility] Last Fill or Written Date and Quantity:  12/20/23, #30 Last Office Visit and Type:  08/22/23, CPE Next Office Visit and Type:  02/17/24, 6 mo f/u Last Controlled Substance Agreement Date:  09/26/14 Last UDS:  09/26/14

## 2024-01-17 NOTE — Telephone Encounter (Signed)
 ERx

## 2024-01-27 DIAGNOSIS — G4731 Primary central sleep apnea: Secondary | ICD-10-CM | POA: Diagnosis not present

## 2024-02-03 DIAGNOSIS — Z9841 Cataract extraction status, right eye: Secondary | ICD-10-CM | POA: Diagnosis not present

## 2024-02-03 DIAGNOSIS — H52223 Regular astigmatism, bilateral: Secondary | ICD-10-CM | POA: Diagnosis not present

## 2024-02-03 DIAGNOSIS — Z9842 Cataract extraction status, left eye: Secondary | ICD-10-CM | POA: Diagnosis not present

## 2024-02-03 DIAGNOSIS — H401132 Primary open-angle glaucoma, bilateral, moderate stage: Secondary | ICD-10-CM | POA: Diagnosis not present

## 2024-02-03 DIAGNOSIS — D3132 Benign neoplasm of left choroid: Secondary | ICD-10-CM | POA: Diagnosis not present

## 2024-02-03 DIAGNOSIS — H353131 Nonexudative age-related macular degeneration, bilateral, early dry stage: Secondary | ICD-10-CM | POA: Diagnosis not present

## 2024-02-06 ENCOUNTER — Ambulatory Visit: Admitting: Family Medicine

## 2024-02-06 ENCOUNTER — Other Ambulatory Visit: Payer: Self-pay | Admitting: Family Medicine

## 2024-02-06 VITALS — BP 108/62 | HR 81 | Temp 98.6°F | Ht 60.0 in | Wt 180.0 lb

## 2024-02-06 DIAGNOSIS — N939 Abnormal uterine and vaginal bleeding, unspecified: Secondary | ICD-10-CM | POA: Insufficient documentation

## 2024-02-06 DIAGNOSIS — E039 Hypothyroidism, unspecified: Secondary | ICD-10-CM

## 2024-02-06 DIAGNOSIS — N950 Postmenopausal bleeding: Secondary | ICD-10-CM | POA: Insufficient documentation

## 2024-02-06 DIAGNOSIS — N1832 Chronic kidney disease, stage 3b: Secondary | ICD-10-CM

## 2024-02-06 DIAGNOSIS — I4819 Other persistent atrial fibrillation: Secondary | ICD-10-CM

## 2024-02-06 DIAGNOSIS — R7303 Prediabetes: Secondary | ICD-10-CM

## 2024-02-06 DIAGNOSIS — R319 Hematuria, unspecified: Secondary | ICD-10-CM

## 2024-02-06 LAB — POC URINALSYSI DIPSTICK (AUTOMATED)
Bilirubin, UA: NEGATIVE
Glucose, UA: NEGATIVE
Ketones, UA: NEGATIVE
Leukocytes, UA: NEGATIVE
Nitrite, UA: NEGATIVE
Protein, UA: NEGATIVE
Spec Grav, UA: <=1.005 — AB (ref 1.010–1.025)
Urobilinogen, UA: 0.2 U/dL
pH, UA: 6.5 (ref 5.0–8.0)

## 2024-02-06 NOTE — Progress Notes (Signed)
 Ph: [phone] Fax: [phone]   Patient ID: Brooke Beasley, female    DOB: 10-Nov-1930, 89 y.o.   MRN: [id number]  This visit was conducted in person.  BP 108/62   Pulse 81   Temp 98.6 F (37 C) (Oral)   Ht 5' (1.524 m)   Wt 180 lb (81.6 kg)   SpO2 97%   BMI 35.15 kg/m    Chief Complaint  Patient presents with   Hematuria    Subjective:   Discussed the use of AI scribe software for clinical note transcription with the patient, who gave verbal consent to proceed.  History of Present Illness   Brooke Beasley is a 89 year old female with chronic kidney disease who presents with vaginal bleeding and abdominal discomfort. She is accompanied by her daughter.  She has experienced stomach cramps and abdominal discomfort for about two weeks, described as a nagging feeling that is intermittent and sometimes occurs upon standing. There is no severe pain radiating to the groin.  She woke up with bleeding, unsure if it is vaginal or urinary. She uses a PureWick incontinence pad, which was dark and coated in blood. The bleeding is not limited to urination and has been more significant this morning. There is no bleeding from the rectum.  She is taking Eliquis  5 mg twice a day. There are no fevers or chills, but she sometimes feels hot. There is no burning sensation during urination, but urinary frequency has increased intermittently for weeks. Urinary incontinence has been more frequent over the past week or two.  She experiences nausea without vomiting. Her appetite has decreased slightly, but there is no weight loss. There is no significant leg swelling beyond her usual. She has been itching on both sides of her body for a long time without any rashes and no new detergents, lotions, or medications.      Did start primrose with black cohosh     Relevant past medical, surgical, family and social history reviewed and updated as indicated. Interim medical history since  our last visit reviewed. Allergies and medications reviewed and updated. Outpatient Medications Prior to Visit  Medication Sig Dispense Refill   apixaban  (ELIQUIS ) 5 MG TABS tablet Take 1 tablet (5 mg total) by mouth 2 (two) times daily. 180 tablet 3   Ascorbic Acid  (VITAMIN C PO) Take by mouth.     bimatoprost (LUMIGAN) 0.01 % SOLN Place 1 drop into both eyes at bedtime.     carvedilol  (COREG ) 6.25 MG tablet Take 1 tablet (6.25 mg total) by mouth 2 (two) times daily with a meal. 180 tablet 1   cetirizine  (ZYRTEC ) 10 MG tablet Take 1 tablet by mouth once daily 90 tablet 1   cholecalciferol  (VITAMIN D ) 1000 units tablet Take 1,000 Units by mouth daily.     clonazePAM  (KLONOPIN ) 0.5 MG tablet TAKE 1 TABLET BY MOUTH AT BEDTIME 30 tablet 0   colchicine  0.6 MG tablet Take 1 tablet (0.6 mg total) by mouth daily. 90 tablet 1   diclofenac  Sodium (VOLTAREN ) 1 % GEL Apply 2 g topically 4 (four) times daily.     DULoxetine  (CYMBALTA ) 20 MG capsule Take 1 capsule by mouth once daily 90 capsule 4   EPINEPHrine  (EPIPEN  2-PAK) 0.3 mg/0.3 mL IJ SOAJ injection Inject 0.3 mg into the muscle as needed for anaphylaxis. XOLAIR  INJECTION PROTOCOL 2 each 2   famotidine  (PEPCID ) 20 MG tablet Take 1 tablet (20 mg total) by mouth 2 (two) times daily.  180 tablet 3   furosemide  (LASIX ) 20 MG tablet Take 1 tablet (20 mg total) by mouth daily. 90 tablet 4   gabapentin  (NEURONTIN ) 300 MG capsule TAKE 1 CAPSULE BY MOUTH ONCE DAILY AS NEEDED FOR  NERVE  PAIN  (ITCHING) 90 capsule 3   hydrALAZINE  (APRESOLINE ) 25 MG tablet Take 1 tablet (25 mg total) by mouth in the morning and at bedtime. 180 tablet 3   levothyroxine  (SYNTHROID ) 100 MCG tablet Take 100 mcg by mouth daily before breakfast.     Magnesium  250 MG CAPS Take 1 capsule by mouth at bedtime.     mometasone  (ELOCON ) 0.1 % ointment Apply pea sized amount to affected area daily as needed. Shower then ointment applied while still wet 45 g 1   Multiple Vitamin (MULTIVITAMIN  ADULT PO) Take by mouth daily.     polyethylene glycol (MIRALAX  / GLYCOLAX ) 17 g packet Take 17 g by mouth daily.     Simethicone  180 MG CAPS Take 1 capsule by mouth daily as needed (indigestion).     spironolactone  (ALDACTONE ) 25 MG tablet TAKE 1 AND 1/2 TABLETS DAILY 135 tablet 3   timolol (TIMOPTIC) 0.5 % ophthalmic solution Place 1 drop into both eyes every morning.     No facility-administered medications prior to visit.     Per HPI unless specifically indicated in ROS section below Review of Systems  Objective:  BP 108/62   Pulse 81   Temp 98.6 F (37 C) (Oral)   Ht 5' (1.524 m)   Wt 180 lb (81.6 kg)   SpO2 97%   BMI 35.15 kg/m   Wt Readings from Last 3 Encounters:  02/06/24 180 lb (81.6 kg)  11/20/23 174 lb (78.9 kg)  09/26/23 174 lb (78.9 kg)            Physical Exam Vitals and nursing note reviewed.  Constitutional:      Appearance: Normal appearance. She is not ill-appearing.  HENT:     Head: Normocephalic and atraumatic.     Mouth/Throat:     Mouth: Mucous membranes are moist.     Pharynx: Oropharynx is clear. No oropharyngeal exudate or posterior oropharyngeal erythema.  Eyes:     Extraocular Movements: Extraocular movements intact.     Pupils: Pupils are equal, round, and reactive to light.  Cardiovascular:     Rate and Rhythm: Normal rate and regular rhythm.     Pulses: Normal pulses.     Heart sounds: Murmur (3/6 systolic throughout) heard.  Pulmonary:     Effort: Pulmonary effort is normal. No respiratory distress.     Breath sounds: Normal breath sounds. No wheezing, rhonchi or rales.  Abdominal:     General: Bowel sounds are normal. There is no distension.     Palpations: Abdomen is soft. There is no mass.     Tenderness: There is no abdominal tenderness. There is no right CVA tenderness, left CVA tenderness, guarding or rebound.     Hernia: No hernia is present.     Comments: No pain to palpation   Genitourinary:    General: Normal vulva.      Exam position: Supine.     Pubic Area: No rash.      Vagina: Bleeding present.     Comments:  No external rash or source of bleed noted Limited speculum exam due to positioning - did not visualize pelvic source of bleed nor cervix but there is blood present in vaginal vault.  No significant pain  with bimanual exam or cervical palpation Musculoskeletal:     Right lower leg: No edema.     Left lower leg: No edema.  Skin:    General: Skin is warm and dry.     Findings: No rash.  Neurological:     Mental Status: She is alert.  Psychiatric:        Mood and Affect: Mood normal.        Behavior: Behavior normal.        Results   Urinalysis: Hematuria present, no leukocytes or bacteria       Results for orders placed or performed in visit on 02/06/24  POCT Urinalysis Dipstick (Automated)   Collection Time: 02/06/24  2:09 PM  Result Value Ref Range   Color, UA pink    Clarity, UA cloudy    Glucose, UA Negative Negative   Bilirubin, UA neg    Ketones, UA neg    Spec Grav, UA <=1.005 (A) 1.010 - 1.025   Blood, UA 3+    pH, UA 6.5 5.0 - 8.0   Protein, UA Negative Negative   Urobilinogen, UA 0.2 0.2 or 1.0 E.U./dL   Nitrite, UA neg    Leukocytes, UA Negative Negative   *Note: Due to a large number of results and/or encounters for the requested time period, some results have not been displayed. A complete set of results can be found in Results Review.  Micro: WBC rare  RBC TNTC  Bact tr  Casts none  Epi none  UCx not sent   Lab Results  Component Value Date   NA 137 08/15/2023   CL 97 08/15/2023   K 4.3 08/15/2023   CO2 32 08/15/2023   BUN 19 08/15/2023   CREATININE 1.12 08/15/2023   GFR 43.22 (L) 08/15/2023   CALCIUM  9.8 08/15/2023   PHOS 3.9 08/15/2023   ALBUMIN  4.3 08/15/2023   GLUCOSE 111 (H) 08/15/2023    Lab Results  Component Value Date   ALT 24 08/15/2023   AST 28 08/15/2023   ALKPHOS 105 08/15/2023   BILITOT 0.7 08/15/2023    Lab Results   Component Value Date   WBC 6.5 08/15/2023   HGB 16.0 (H) 08/15/2023   HCT 47.9 (H) 08/15/2023   MCV 98.2 08/15/2023   PLT 232.0 08/15/2023   Assessment & Plan:      Vaginal bleeding Acute vaginal bleeding with blood in the vaginal vault. No obvious source noted on exam, unable to visualize cervix. Possible uterine bleed exacerbated by black cohosh supplement.  - Order CBC to rule out anemia. - Order pelvic ultrasound to evaluate uterus and ovaries, specifically endometrial thickness. - Recommend discontinuation of primrose oil pill supplement with black cohosh. - Discuss gynecology referral pending results of above workup.  Urinary incontinence Chronic urinary incontinence with increased frequency and urgency. Hematuria noted without leukocytes or bacteria, suggesting no active infection.  Chronic kidney disease Chronic kidney disease with left kidney atrophy. Current renal function needs updating to assess for potential need to adjust Eliquis  dosage. - Check renal panel to update renal function. - Consider Eliquis  dose change versus holding based on renal function results.      Problem List Items Addressed This Visit     Stage 3b chronic kidney disease (HCC)   Relevant Orders   Renal function panel   Persistent atrial fibrillation (HCC)   On eliquis , sounds regular today.  Consider holding eliquis  while we work up possible vaginal bleed pending lab results today  Relevant Orders   CBC with Differential/Platelet   Vaginal bleeding - Primary   Relevant Orders   US  Pelvic Complete With Transvaginal   Other Visit Diagnoses       Hematuria, unspecified type       Relevant Orders   POCT Urinalysis Dipstick (Automated) (Completed)        No orders of the defined types were placed in this encounter.   Orders Placed This Encounter  Procedures   US  Pelvic Complete With Transvaginal    Standing Status:   Future    Expiration Date:   02/05/2025    Reason for  Exam (SYMPTOM  OR DIAGNOSIS REQUIRED):   vaginal bleed    Preferred imaging location?:   [facility]   Renal function panel   CBC with Differential/Platelet   POCT Urinalysis Dipstick (Automated)    Patient Instructions  VISIT SUMMARY: Today, you came in with concerns about vaginal bleeding and abdominal discomfort. We discussed your symptoms, including the bleeding, stomach cramps, and increased urinary frequency. We also reviewed your chronic kidney disease and current medications.  YOUR PLAN: -VAGINAL BLEEDING: You have been experiencing vaginal bleeding, which could be due to an issue with your uterus. We will do a blood test to check for anemia and a pelvic ultrasound to look at your uterus and ovaries. Please stop taking the primrose oil supplement with black cohosh. Depending on the results, we may refer you to a gynecologist. If recurrent bleed, hold eliquis  until resolved.   -URINARY INCONTINENCE: You have had increased urinary frequency and urgency, but there is no sign of infection. We will continue to monitor this condition.  -CHRONIC KIDNEY DISEASE: Your chronic kidney disease involves reduced kidney function. We will check your kidney function with a blood test to see if we need to adjust your Eliquis  dosage.  INSTRUCTIONS: Please get the blood tests (CBC and renal panel) and the pelvic ultrasound done as soon as possible. We will contact you with the results and discuss the next steps. If you experience any new or worsening symptoms, please contact our office immediately.  I have ordered pelvic ultrasound to [facility].  Call centralized scheduling at [place] to schedule appointment: [phone]    Follow up plan: Return if symptoms worsen or fail to improve.  Anton Blas, MD

## 2024-02-06 NOTE — Patient Instructions (Signed)
 VISIT SUMMARY: Today, you came in with concerns about vaginal bleeding and abdominal discomfort. We discussed your symptoms, including the bleeding, stomach cramps, and increased urinary frequency. We also reviewed your chronic kidney disease and current medications.  YOUR PLAN: -VAGINAL BLEEDING: You have been experiencing vaginal bleeding, which could be due to an issue with your uterus. We will do a blood test to check for anemia and a pelvic ultrasound to look at your uterus and ovaries. Please stop taking the primrose oil supplement with black cohosh. Depending on the results, we may refer you to a gynecologist. If recurrent bleed, hold eliquis  until resolved.   -URINARY INCONTINENCE: You have had increased urinary frequency and urgency, but there is no sign of infection. We will continue to monitor this condition.  -CHRONIC KIDNEY DISEASE: Your chronic kidney disease involves reduced kidney function. We will check your kidney function with a blood test to see if we need to adjust your Eliquis  dosage.  INSTRUCTIONS: Please get the blood tests (CBC and renal panel) and the pelvic ultrasound done as soon as possible. We will contact you with the results and discuss the next steps. If you experience any new or worsening symptoms, please contact our office immediately.  I have ordered pelvic ultrasound to [facility].  Call centralized scheduling at [place] to schedule appointment: [phone]

## 2024-02-06 NOTE — Assessment & Plan Note (Addendum)
 On eliquis , sounds regular today.  Consider holding eliquis  while we work up possible vaginal bleed pending lab results today

## 2024-02-07 LAB — CBC WITH DIFFERENTIAL/PLATELET
Basophils Absolute: 0.1 K/uL (ref 0.0–0.1)
Basophils Relative: 0.7 % (ref 0.0–3.0)
Eosinophils Absolute: 0.3 K/uL (ref 0.0–0.7)
Eosinophils Relative: 3.7 % (ref 0.0–5.0)
HCT: 46.2 % — ABNORMAL HIGH (ref 36.0–46.0)
Hemoglobin: 15.4 g/dL — ABNORMAL HIGH (ref 12.0–15.0)
Lymphocytes Relative: 30.3 % (ref 12.0–46.0)
Lymphs Abs: 2.1 K/uL (ref 0.7–4.0)
MCHC: 33.3 g/dL (ref 30.0–36.0)
MCV: 96 fl (ref 78.0–100.0)
Monocytes Absolute: 1 K/uL (ref 0.1–1.0)
Monocytes Relative: 14.3 % — ABNORMAL HIGH (ref 3.0–12.0)
Neutro Abs: 3.5 K/uL (ref 1.4–7.7)
Neutrophils Relative %: 51 % (ref 43.0–77.0)
Platelets: 211 K/uL (ref 150.0–400.0)
RBC: 4.82 Mil/uL (ref 3.87–5.11)
RDW: 14.1 % (ref 11.5–15.5)
WBC: 6.8 K/uL (ref 4.0–10.5)

## 2024-02-07 LAB — RENAL FUNCTION PANEL
Albumin: 4.1 g/dL (ref 3.5–5.2)
BUN: 31 mg/dL — ABNORMAL HIGH (ref 6–23)
CO2: 30 meq/L (ref 19–32)
Calcium: 9.4 mg/dL (ref 8.4–10.5)
Chloride: 96 meq/L (ref 96–112)
Creatinine, Ser: 1.26 mg/dL — ABNORMAL HIGH (ref 0.40–1.20)
GFR: 37.4 mL/min — ABNORMAL LOW (ref >60.00)
Glucose, Bld: 85 mg/dL (ref 70–99)
Phosphorus: 3.6 mg/dL (ref 2.3–4.6)
Potassium: 4.3 meq/L (ref 3.5–5.1)
Sodium: 134 meq/L — ABNORMAL LOW (ref 135–145)

## 2024-02-09 ENCOUNTER — Ambulatory Visit
Admission: RE | Admit: 2024-02-09 | Discharge: 2024-02-09 | Disposition: A | Discharge status: Home / Self Care | Source: Ambulatory Visit | Attending: Family Medicine | Admitting: Family Medicine

## 2024-02-09 DIAGNOSIS — N939 Abnormal uterine and vaginal bleeding, unspecified: Secondary | ICD-10-CM | POA: Diagnosis not present

## 2024-02-09 DIAGNOSIS — R9389 Abnormal findings on diagnostic imaging of other specified body structures: Secondary | ICD-10-CM | POA: Diagnosis not present

## 2024-02-10 ENCOUNTER — Other Ambulatory Visit (INDEPENDENT_AMBULATORY_CARE_PROVIDER_SITE_OTHER)

## 2024-02-10 ENCOUNTER — Ambulatory Visit: Payer: Self-pay | Admitting: Family Medicine

## 2024-02-10 DIAGNOSIS — N1832 Chronic kidney disease, stage 3b: Secondary | ICD-10-CM

## 2024-02-10 DIAGNOSIS — R7303 Prediabetes: Secondary | ICD-10-CM

## 2024-02-10 DIAGNOSIS — E039 Hypothyroidism, unspecified: Secondary | ICD-10-CM

## 2024-02-10 LAB — RENAL FUNCTION PANEL
Albumin: 4.2 g/dL (ref 3.5–5.2)
BUN: 22 mg/dL (ref 6–23)
CO2: 32 meq/L (ref 19–32)
Calcium: 9.8 mg/dL (ref 8.4–10.5)
Chloride: 95 meq/L — ABNORMAL LOW (ref 96–112)
Creatinine, Ser: 1.38 mg/dL — ABNORMAL HIGH (ref 0.40–1.20)
GFR: 33.53 mL/min — ABNORMAL LOW (ref >60.00)
Glucose, Bld: 102 mg/dL — ABNORMAL HIGH (ref 70–99)
Phosphorus: 4 mg/dL (ref 2.3–4.6)
Potassium: 4.6 meq/L (ref 3.5–5.1)
Sodium: 135 meq/L (ref 135–145)

## 2024-02-10 LAB — CBC WITH DIFFERENTIAL/PLATELET
Basophils Absolute: 0 K/uL (ref 0.0–0.1)
Basophils Relative: 0.6 % (ref 0.0–3.0)
Eosinophils Absolute: 0.3 K/uL (ref 0.0–0.7)
Eosinophils Relative: 5.8 % — ABNORMAL HIGH (ref 0.0–5.0)
HCT: 48.7 % — ABNORMAL HIGH (ref 36.0–46.0)
Hemoglobin: 16.2 g/dL — ABNORMAL HIGH (ref 12.0–15.0)
Lymphocytes Relative: 37 % (ref 12.0–46.0)
Lymphs Abs: 2 K/uL (ref 0.7–4.0)
MCHC: 33.3 g/dL (ref 30.0–36.0)
MCV: 94.3 fl (ref 78.0–100.0)
Monocytes Absolute: 0.7 K/uL (ref 0.1–1.0)
Monocytes Relative: 13.4 % — ABNORMAL HIGH (ref 3.0–12.0)
Neutro Abs: 2.4 K/uL (ref 1.4–7.7)
Neutrophils Relative %: 43.2 % (ref 43.0–77.0)
Platelets: 218 K/uL (ref 150.0–400.0)
RBC: 5.16 Mil/uL — ABNORMAL HIGH (ref 3.87–5.11)
RDW: 14.1 % (ref 11.5–15.5)
WBC: 5.4 K/uL (ref 4.0–10.5)

## 2024-02-10 LAB — HEMOGLOBIN A1C: Hgb A1c MFr Bld: 6.3 % (ref 4.6–6.5)

## 2024-02-10 LAB — TSH: TSH: 4.11 u[IU]/mL (ref 0.35–5.50)

## 2024-02-11 ENCOUNTER — Other Ambulatory Visit: Payer: Self-pay | Admitting: Family Medicine

## 2024-02-11 DIAGNOSIS — F418 Other specified anxiety disorders: Secondary | ICD-10-CM

## 2024-02-13 NOTE — Telephone Encounter (Signed)
 ERx

## 2024-02-16 ENCOUNTER — Ambulatory Visit: Payer: Self-pay | Admitting: Family Medicine

## 2024-02-17 ENCOUNTER — Encounter: Payer: Self-pay | Admitting: Family Medicine

## 2024-02-17 ENCOUNTER — Ambulatory Visit: Admitting: Family Medicine

## 2024-02-17 VITALS — BP 138/100 | HR 57 | Temp 97.7°F | Ht 60.0 in | Wt 181.0 lb

## 2024-02-17 DIAGNOSIS — I4819 Other persistent atrial fibrillation: Secondary | ICD-10-CM | POA: Diagnosis not present

## 2024-02-17 DIAGNOSIS — R9389 Abnormal findings on diagnostic imaging of other specified body structures: Secondary | ICD-10-CM | POA: Diagnosis not present

## 2024-02-17 DIAGNOSIS — F418 Other specified anxiety disorders: Secondary | ICD-10-CM | POA: Diagnosis not present

## 2024-02-17 DIAGNOSIS — N939 Abnormal uterine and vaginal bleeding, unspecified: Secondary | ICD-10-CM

## 2024-02-17 DIAGNOSIS — I10 Essential (primary) hypertension: Secondary | ICD-10-CM

## 2024-02-17 DIAGNOSIS — N1832 Chronic kidney disease, stage 3b: Secondary | ICD-10-CM

## 2024-02-17 DIAGNOSIS — D6869 Other thrombophilia: Secondary | ICD-10-CM | POA: Diagnosis not present

## 2024-02-17 DIAGNOSIS — G4739 Other sleep apnea: Secondary | ICD-10-CM | POA: Diagnosis not present

## 2024-02-17 NOTE — Progress Notes (Signed)
 Ph: [phone] Fax: [phone]   Patient ID: Brooke Beasley, female    DOB: Dec 30, 1930, 89 y.o.   MRN: [id number]  This visit was conducted in person.  BP (!) 138/100   Pulse (!) 57   Temp 97.7 F (36.5 C) (Oral)   Ht 5' (1.524 m)   Wt 181 lb (82.1 kg)   SpO2 97%   BMI 35.35 kg/m   BP Readings from Last 3 Encounters:  02/17/24 (!) 138/100  02/06/24 108/62  11/07/23 98/60   160s/70s on retesting  CC: 6 mo f/u visit  Subjective:   HPI: Brooke Beasley is a 89 y.o. female presenting on 02/17/2024 for Medical Management of Chronic Issues (Pt here for a 1 mo f/u from ultrasound/Pt accompanied by daughter [name])   See prior note for details.  Episode of vaginal bleeding in setting of eliquis  use.  Pelvic US  showed thickened endometrium to 15mm. Has been referred to GYN.  She is now off black cohosh supplement - had been taking this for about 2 months.   CKD with L kidney atrophy - this is overall stable on latest labwork.   She is tolerating duloxetine  20mg  daily am.   Complex sleep apnea followed by pulm Dr Jess using BiPAP with ASV (Adaptive-servo ventilation) machine with benefit.   HTN - bp elevated today - she takes extra hydralazine  PRN.      Relevant past medical, surgical, family and social history reviewed and updated as indicated. Interim medical history since our last visit reviewed. Allergies and medications reviewed and updated. Outpatient Medications Prior to Visit  Medication Sig Dispense Refill   apixaban  (ELIQUIS ) 5 MG TABS tablet Take 1 tablet (5 mg total) by mouth 2 (two) times daily. (Patient not taking: Reported on 02/18/2024) 180 tablet 3   Ascorbic Acid  (VITAMIN C PO) Take by mouth.     bimatoprost (LUMIGAN) 0.01 % SOLN Place 1 drop into both eyes at bedtime.     carvedilol  (COREG ) 6.25 MG tablet Take 1 tablet (6.25 mg total) by mouth 2 (two) times daily with a meal. 180 tablet 1   cetirizine  (ZYRTEC ) 10 MG tablet Take 1  tablet by mouth once daily 90 tablet 1   cholecalciferol  (VITAMIN D ) 1000 units tablet Take 1,000 Units by mouth daily.     clonazePAM  (KLONOPIN ) 0.5 MG tablet TAKE 1 TABLET BY MOUTH AT BEDTIME 30 tablet 0   colchicine  0.6 MG tablet Take 1 tablet (0.6 mg total) by mouth daily. 90 tablet 1   diclofenac  Sodium (VOLTAREN ) 1 % GEL Apply 2 g topically 4 (four) times daily.     DULoxetine  (CYMBALTA ) 20 MG capsule Take 1 capsule by mouth once daily 90 capsule 4   EPINEPHrine  (EPIPEN  2-PAK) 0.3 mg/0.3 mL IJ SOAJ injection Inject 0.3 mg into the muscle as needed for anaphylaxis. XOLAIR  INJECTION PROTOCOL 2 each 2   famotidine  (PEPCID ) 20 MG tablet Take 1 tablet (20 mg total) by mouth 2 (two) times daily. 180 tablet 3   furosemide  (LASIX ) 20 MG tablet Take 1 tablet (20 mg total) by mouth daily. 90 tablet 4   gabapentin  (NEURONTIN ) 300 MG capsule TAKE 1 CAPSULE BY MOUTH ONCE DAILY AS NEEDED FOR  NERVE  PAIN  (ITCHING) 90 capsule 3   hydrALAZINE  (APRESOLINE ) 25 MG tablet Take 1 tablet (25 mg total) by mouth in the morning and at bedtime. 180 tablet 3   levothyroxine  (SYNTHROID ) 100 MCG tablet Take 100 mcg by mouth daily before breakfast.  Magnesium  250 MG CAPS Take 1 capsule by mouth at bedtime.     mometasone  (ELOCON ) 0.1 % ointment Apply pea sized amount to affected area daily as needed. Shower then ointment applied while still wet 45 g 1   Multiple Vitamin (MULTIVITAMIN ADULT PO) Take by mouth daily.     polyethylene glycol (MIRALAX  / GLYCOLAX ) 17 g packet Take 17 g by mouth daily.     Simethicone  180 MG CAPS Take 1 capsule by mouth daily as needed (indigestion).     spironolactone  (ALDACTONE ) 25 MG tablet TAKE 1 AND 1/2 TABLETS DAILY 135 tablet 3   timolol (TIMOPTIC) 0.5 % ophthalmic solution Place 1 drop into both eyes every morning.     No facility-administered medications prior to visit.     Per HPI unless specifically indicated in ROS section below Review of Systems  Objective:  BP (!)  138/100   Pulse (!) 57   Temp 97.7 F (36.5 C) (Oral)   Ht 5' (1.524 m)   Wt 181 lb (82.1 kg)   SpO2 97%   BMI 35.35 kg/m   Wt Readings from Last 3 Encounters:  02/17/24 181 lb (82.1 kg)  02/06/24 180 lb (81.6 kg)  11/20/23 174 lb (78.9 kg)      Physical Exam Vitals and nursing note reviewed.  Constitutional:      Appearance: Normal appearance. She is not ill-appearing.  HENT:     Head: Normocephalic and atraumatic.     Mouth/Throat:     Mouth: Mucous membranes are moist.     Pharynx: Oropharynx is clear. No oropharyngeal exudate or posterior oropharyngeal erythema.  Eyes:     Extraocular Movements: Extraocular movements intact.     Pupils: Pupils are equal, round, and reactive to light.  Cardiovascular:     Rate and Rhythm: Normal rate and regular rhythm.     Pulses: Normal pulses.     Heart sounds: Murmur (3/6 systolic) heard.  Pulmonary:     Effort: Pulmonary effort is normal. No respiratory distress.     Breath sounds: Normal breath sounds. No wheezing, rhonchi or rales.  Musculoskeletal:     Right lower leg: No edema.     Left lower leg: No edema.  Skin:    General: Skin is warm and dry.     Findings: No rash.  Neurological:     Mental Status: She is alert.  Psychiatric:        Mood and Affect: Mood normal.        Behavior: Behavior normal.       Results for orders placed or performed in visit on 02/10/24  Hemoglobin A1c   Collection Time: 02/10/24  8:12 AM  Result Value Ref Range   Hgb A1c MFr Bld 6.3 4.6 - 6.5 %  Renal function panel   Collection Time: 02/10/24  8:12 AM  Result Value Ref Range   Sodium 135 135 - 145 mEq/L   Potassium 4.6 3.5 - 5.1 mEq/L   Chloride 95 (L) 96 - 112 mEq/L   CO2 32 19 - 32 mEq/L   Albumin  4.2 3.5 - 5.2 g/dL   BUN 22 6 - 23 mg/dL   Creatinine, Ser 8.61 (H) 0.40 - 1.20 mg/dL   Glucose, Bld 897 (H) 70 - 99 mg/dL   Phosphorus 4.0 2.3 - 4.6 mg/dL   GFR 66.46 (L) >39.99 mL/min   Calcium  9.8 8.4 - 10.5 mg/dL  CBC with  Differential/Platelet   Collection Time: 02/10/24  8:12 AM  Result Value Ref Range   WBC 5.4 4.0 - 10.5 K/uL   RBC 5.16 (H) 3.87 - 5.11 Mil/uL   Hemoglobin 16.2 (H) 12.0 - 15.0 g/dL   HCT 51.2 (H) 63.9 - 53.9 %   MCV 94.3 78.0 - 100.0 fl   MCHC 33.3 30.0 - 36.0 g/dL   RDW 85.8 88.4 - 84.4 %   Platelets 218.0 150.0 - 400.0 K/uL   Neutrophils Relative % 43.2 43.0 - 77.0 %   Lymphocytes Relative 37.0 12.0 - 46.0 %   Monocytes Relative 13.4 (H) 3.0 - 12.0 %   Eosinophils Relative 5.8 (H) 0.0 - 5.0 %   Basophils Relative 0.6 0.0 - 3.0 %   Neutro Abs 2.4 1.4 - 7.7 K/uL   Lymphs Abs 2.0 0.7 - 4.0 K/uL   Monocytes Absolute 0.7 0.1 - 1.0 K/uL   Eosinophils Absolute 0.3 0.0 - 0.7 K/uL   Basophils Absolute 0.0 0.0 - 0.1 K/uL  TSH   Collection Time: 02/10/24  8:12 AM  Result Value Ref Range   TSH 4.11 0.35 - 5.50 uIU/mL   *Note: Due to a large number of results and/or encounters for the requested time period, some results have not been displayed. A complete set of results can be found in Results Review.    Assessment & Plan:   Problem List Items Addressed This Visit     Essential hypertension   Chronic, recalcitrant, now followed by cardiology and overall stable period on current regimen:  -Carvedilol  6.25mg  bid -Lasix  20mg  daily -Spironolactone  37.5mg  daily -Hydralazine  25mg  BID PRN elevated BP readings. She will check BP when she arrives home and take PRN hydralazine  if needed.       Anxiety associated with depression   Chronic, overall stable period on low dose duloxetine  - continue      Stage 3b chronic kidney disease (HCC)   Chronic, stable with latest GFR 33.5      Persistent atrial fibrillation (HCC)   Eliquis  currently on hold as per above.  Sounds regular today.  Reviewed increased stroke risk but they are in agreement re continued hold.       Complex sleep apnea syndrome   Appreciate pulm Dr Jess sleep specialist care.  She is now using BiPAP with ASV  (adaptive-servo ventilation) device with benefit.       Vaginal bleeding - Primary   CBC stable. Refer to GYN.  Eliquis  remains on hold.       Relevant Orders   Ambulatory referral to Gynecology   Thickened endometrium   15mm endometrial stripe on recent pelvic US . Will refer to GYN  for further evaluation. She is now off black cohosh - took this for about 2 months prior to recent vaginal bleed.      Relevant Orders   Ambulatory referral to Gynecology   Hypercoagulable state due to persistent atrial fibrillation (HCC)   Normally on eliquis  - currently on hold as per above         No orders of the defined types were placed in this encounter.   Orders Placed This Encounter  Procedures   Ambulatory referral to Gynecology    Referral Priority:   Routine    Referral Type:   Consultation    Referral Reason:   Specialty Services Required    Requested Specialty:   Gynecology    Number of Visits Requested:   1    Patient Instructions  I will refer you to gynecologist at [facility] - you  may call [phone] to schedule an appointment.  Continue holding eliquis  until bleeding is better. If restarted, hold it 1-2 days prior to GYN appointment.  Ok to take extra hydralazine  if BP staying elevated at home.  Good to see you today   Follow up plan: Return in about 6 months (around 08/17/2024) for annual exam, prior fasting for blood work, medicare wellness visit.  Anton Blas, MD

## 2024-02-17 NOTE — Patient Instructions (Addendum)
 I will refer you to gynecologist at [facility] - you may call [phone] to schedule an appointment.  Continue holding eliquis  until bleeding is better. If restarted, hold it 1-2 days prior to GYN appointment.  Ok to take extra hydralazine  if BP staying elevated at home.  Good to see you today

## 2024-02-18 DIAGNOSIS — I4819 Other persistent atrial fibrillation: Secondary | ICD-10-CM | POA: Insufficient documentation

## 2024-02-18 DIAGNOSIS — R9389 Abnormal findings on diagnostic imaging of other specified body structures: Secondary | ICD-10-CM | POA: Insufficient documentation

## 2024-02-18 NOTE — Assessment & Plan Note (Signed)
 Eliquis  currently on hold as per above.  Sounds regular today.  Reviewed increased stroke risk but they are in agreement re continued hold.

## 2024-02-18 NOTE — Assessment & Plan Note (Signed)
 Normally on eliquis  - currently on hold as per above

## 2024-02-18 NOTE — Assessment & Plan Note (Signed)
 15mm endometrial stripe on recent pelvic US . Will refer to GYN  for further evaluation. She is now off black cohosh - took this for about 2 months prior to recent vaginal bleed.

## 2024-02-18 NOTE — Assessment & Plan Note (Addendum)
 Appreciate pulm Dr Jess sleep specialist care.  She is now using BiPAP with ASV (adaptive-servo ventilation) device with benefit.

## 2024-02-18 NOTE — Assessment & Plan Note (Signed)
 Chronic, recalcitrant, now followed by cardiology and overall stable period on current regimen:  -Carvedilol  6.25mg  bid -Lasix  20mg  daily -Spironolactone  37.5mg  daily -Hydralazine  25mg  BID PRN elevated BP readings. She will check BP when she arrives home and take PRN hydralazine  if needed.

## 2024-02-18 NOTE — Assessment & Plan Note (Addendum)
 CBC stable. Refer to GYN.  Eliquis  remains on hold.

## 2024-02-18 NOTE — Assessment & Plan Note (Addendum)
 Chronic, overall stable period on low dose duloxetine  - continue

## 2024-02-18 NOTE — Assessment & Plan Note (Signed)
 Chronic, stable with latest GFR 33.5

## 2024-02-20 ENCOUNTER — Telehealth: Payer: Self-pay | Admitting: Family Medicine

## 2024-02-20 NOTE — Telephone Encounter (Signed)
 Copied from CRM [id number]. Topic: Referral - Question >> Feb 20, 2024 11:24 AM Terri MATSU wrote: Reason for CRM: [name] (patient daughter) is calling regarding moms refer to obgyn, but the place Dr.G refer her to they are not seeing her till Jan and she wants to know if he can maybe refer to another location where she can get it sooner. Callback number [phone]

## 2024-02-22 NOTE — Telephone Encounter (Signed)
 Please change this to an urgent referral with postmenopausal bleeding in the setting of previous anticoagulation.  Let me know if she cannot be seen sooner.  Thanks.

## 2024-02-23 DIAGNOSIS — M11211 Other chondrocalcinosis, right shoulder: Secondary | ICD-10-CM | POA: Diagnosis not present

## 2024-02-23 DIAGNOSIS — M19011 Primary osteoarthritis, right shoulder: Secondary | ICD-10-CM | POA: Diagnosis not present

## 2024-02-23 NOTE — Telephone Encounter (Signed)
 I have sent a message to the [facility] office to see if able to get a sooner appt.   I CC'd you on the Referral message.

## 2024-02-24 NOTE — Telephone Encounter (Signed)
 Noted. Thanks.

## 2024-02-24 NOTE — Telephone Encounter (Signed)
 Appreciate Dr Cleatus making this change  Agree would want her seen prior to January.  If [facility] can't see her sooner we can refer elsewhere.

## 2024-02-27 DIAGNOSIS — G4731 Primary central sleep apnea: Secondary | ICD-10-CM | POA: Diagnosis not present

## 2024-03-06 ENCOUNTER — Ambulatory Visit: Admitting: Obstetrics & Gynecology

## 2024-03-06 ENCOUNTER — Other Ambulatory Visit (HOSPITAL_COMMUNITY)
Admission: RE | Admit: 2024-03-06 | Discharge: 2024-03-06 | Disposition: A | Discharge status: Home / Self Care | Source: Ambulatory Visit | Attending: Obstetrics & Gynecology | Admitting: Obstetrics & Gynecology

## 2024-03-06 VITALS — BP 109/70 | HR 81 | Wt 185.0 lb

## 2024-03-06 DIAGNOSIS — C541 Malignant neoplasm of endometrium: Secondary | ICD-10-CM

## 2024-03-06 DIAGNOSIS — N950 Postmenopausal bleeding: Secondary | ICD-10-CM

## 2024-03-06 DIAGNOSIS — R9389 Abnormal findings on diagnostic imaging of other specified body structures: Secondary | ICD-10-CM

## 2024-03-06 MED ORDER — NORETHINDRONE ACETATE 5 MG PO TABS: 10.0000 mg | ORAL_TABLET | Freq: Every day | ORAL | 2 refills | Status: AC

## 2024-03-06 NOTE — Patient Instructions (Signed)
 ENDOMETRIAL BIOPSY POST-PROCEDURE INSTRUCTIONS  You may take Ibuprofen , Aleve  or Tylenol  for pain if needed.  Cramping should resolve within in 24 hours.  You may have a small amount of spotting.  You should wear a mini pad for the next few days.  You need to call if you have any pelvic pain, fever, heavy bleeding or foul smelling vaginal discharge.  Shower or bathe as normal  6. We will call you within one week with results.

## 2024-03-06 NOTE — Progress Notes (Signed)
      GYNECOLOGY OFFICE PROCEDURE NOTE   Brooke Beasley is a 89 y.o. PMP F with complex medical history here for endometrial biopsy for postmenopausal bleeding over the last month. Recent ultrasound also showed 15 mm abnormally thickened endometrium.  Was taking black cohosh supplement with primose for two months and had vaginal bleeding last month; patient is also chronically anticoagulated on Eliquis  given her atrial fibrillation. She stopped taking those supplements as soon as her bleeding occurred. Today, she reports having scant bleeding but had no bleeding x 2 weeks.  No other concerning symptoms.  She is here with her daughter.  US  Pelvic Complete With Transvaginal Result Date: 02/14/2024 CLINICAL DATA:  Vaginal bleeding EXAM: TRANSABDOMINAL AND TRANSVAGINAL ULTRASOUND OF PELVIS TECHNIQUE: Both transabdominal and transvaginal ultrasound examinations of the pelvis were performed. Transabdominal technique was performed for global imaging of the pelvis including uterus, ovaries, adnexal regions, and pelvic cul-de-sac. It was necessary to proceed with endovaginal exam following the transabdominal exam to visualize the uterus, endometrium ovaries, and adnexa. COMPARISON:  CT angiography abdomen pelvis 10/25/2022 FINDINGS: Uterus Measurements: 6.7 x 4.0 x 5 cm = volume: 71 mL. No fibroids or other mass visualized. Endometrium Thickness: 15 mm.  Endometrial stripe is diffusely heterogeneous. Right ovary Not visualized Left ovary Not visualized Other findings No abnormal free fluid. IMPRESSION: Abnormally thickened endometrium measuring up to 15 mm. In the setting of post-menopausal bleeding, endometrial sampling is indicated to exclude carcinoma. If results are benign, sonohysterogram should be considered for focal lesion work-up. (Ref: Radiological Reasoning: Algorithmic Workup of Abnormal Vaginal Bleeding with Endovaginal Sonography and Sonohysterography. AJR 2008; [id number]) Electronically Signed    By: Aliene Lloyd M.D.   On: 02/14/2024 07:35    ENDOMETRIAL BIOPSY     The indications for endometrial biopsy were reviewed.   Risks of the biopsy including cramping, bleeding, infection, uterine perforation, inadequate specimen and need for additional procedures were discussed. Offered alternative of hysteroscopy, dilation and curettage in OR. The patient states she understands the R/B/I/A and agrees to undergo procedure today.  Patient was also counseled about use of a paracervical block, she agreed to this. Consent was signed. Time out was performed. Chaperone was present during entire procedure.   Patient was positioned in dorsal lithotomy position. A Pedersen vaginal speculum was placed.  Atrophic vagina noted, no lesions, no cervical lesions noted.  Scant blood noted in vaginal vault. The cervix was prepped with Betadine x 3.  A paracervical block was placed around the cervicovaginal junction using 10 ml of 1% Lidocaine .  A single-toothed tenaculum was placed on the anterior lip of the cervix to stabilize it. The 3 mm pipelle was easily introduced into the endometrial cavity without difficulty to a depth of 7 cm, and a moderate amount of tissue was obtained after three passes and sent to pathology. The instruments were removed from the patient's vagina. Minimal bleeding from the cervix was noted. The patient tolerated the procedure well.   Patient was given post procedure instructions.  Will follow up pathology and manage accordingly; patient will be contacted with results and recommendations.  Aygestin 10 mg daily was prescribed for now to help with bleeding and counteract endometrial thickness.  Routine preventative health maintenance measures emphasized.       GLORIS HUGGER, MD, FACOG Obstetrician & Gynecologist, [facility] for [employer], [facility]

## 2024-03-09 ENCOUNTER — Telehealth: Payer: Self-pay | Admitting: *Deleted

## 2024-03-09 ENCOUNTER — Ambulatory Visit: Payer: Self-pay | Admitting: Obstetrics & Gynecology

## 2024-03-09 DIAGNOSIS — C541 Malignant neoplasm of endometrium: Secondary | ICD-10-CM | POA: Insufficient documentation

## 2024-03-09 NOTE — Progress Notes (Signed)
 Addendum  Called patient at home to discuss results of endometrial biopsy.  11.11.2025 A. ENDOMETRIUM, BIOPSY:  -  Endometrioid carcinoma (FIGO grade 1 of 3).   Support given to patient and her family.  She was told that GYN Oncology has already been contacted, they will call her with plans for further management.  All questions answered.   GLORIS HUGGER, MD, FACOG Obstetrician & Gynecologist, [facility] for [employer], [facility]

## 2024-03-09 NOTE — Addendum Note (Signed)
 Addended by: HERCHEL GRUMET A on: 03/09/2024 10:27 AM   Modules accepted: Orders

## 2024-03-09 NOTE — Telephone Encounter (Signed)
 Spoke with the patient and her daughter regarding the referral to GYN oncology. Patient scheduled as new patient with Dr Viktoria on 11/21 at 9 am. Patient given an arrival time of 8:30 am.  Explained to the patient the the doctor will perform a pelvic exam at this visit. Patient given the policy that only one visitor allowed and that visitor must be over 16 yrs are allowed in the Cancer Center. Patient given the address/phone number for the clinic and that the center offers free valet service. Patient aware that masks optional.

## 2024-03-10 ENCOUNTER — Other Ambulatory Visit: Payer: Self-pay | Admitting: Family Medicine

## 2024-03-10 DIAGNOSIS — F418 Other specified anxiety disorders: Secondary | ICD-10-CM

## 2024-03-12 ENCOUNTER — Encounter: Payer: Self-pay | Admitting: Family Medicine

## 2024-03-12 NOTE — Telephone Encounter (Signed)
 ERx

## 2024-03-13 NOTE — Telephone Encounter (Signed)
 Don't see where you have given last ok to refill.   Lab Results  Component Value Date   TSH 4.11 02/10/2024

## 2024-03-14 ENCOUNTER — Encounter: Payer: Self-pay | Admitting: Gynecologic Oncology

## 2024-03-14 NOTE — Progress Notes (Signed)
 GYNECOLOGIC ONCOLOGY NEW PATIENT CONSULTATION   Patient Name: Brooke Beasley  Patient Age: 89 y.o. Date of Service: 03/16/24 Referring Provider: Gloris Hugger, MD  Primary Care Provider: Rilla Baller, MD Consulting Provider: Comer [name], MD   Assessment/Plan:  Postmenopausal patient with low-grade endometrial cancer.  We reviewed the nature of endometrial cancer and its recommended surgical staging, including total hysterectomy, bilateral salpingo-oophorectomy, and lymph node assessment.   We discussed that most endometrial cancer is detected early.   Given her prior history, notably C. difficile infection and postoperative infection after her rectal surgery in 2023, as well as her medical comorbidities, discussed that he is at somewhat higher risk for major surgery.  We discussed in the setting of patients who desire fertility preservation or who are not suitable candidates for surgery, other treatment options for endometrial cancer include progestin therapy and radiation.  The patient is quite concerned about additional surgery, especially given the conversation that she had with infectious diseases about any surgery in the future.    We reviewed the role of progestin therapy and the effect on preneoplastic and neoplastic lesions, believed to include induction of apoptosis in addition to tissue sloughing during withdrawal bleeding.  Activation of the progesterone receptors is believed to lead stromal decidualization and thinning of the lining.  We reviewed the 3 most studied options, to include levonorgesterol IUD, oral medorxyprogesterone acetate, or oral megesterol acetate.    She understands that all options have few side effects, most common being infrequent edema, GI disturbances, and thromboembolic events), but that local progesterone through IUD may have a stronger effect on the endometrium with less systemic side effects.  I worry some given her symptoms already  with norethindrone  that she may have difficulty tolerating higher dose progesterone.  After our discussion, at this time, she would like to proceed with progestin therapy using a Mirena  IUD.  For monitoring, she understands that there is no recommend standard of care.  We will base her surveillance on GOG 224 protocol.  This will include EMB at 3 months following initiation of treatment.  EMBs will be performed at 3 to 6 month intervals, until a minimum of 3 negative biopsy results are obtained, after which sampling frequeny may then be yearly or until new abnormal uterine bleeding develops.  If persistence of endometrial cancer is noted by 9 months of treatment, then we will discuss additional agents or fitness for surgery.    My office will reach out to her PCP, cardiologist, and infectious disease teams about preoperative clearance for hysteroscopy, dilation and curettage, MRI and IUD placement.  We will also ask about her fitness for major surgery if we ultimately decide to proceed with definitive surgery (hysterectomy).  We will proceed with hysteroscopy, dilation and curettage, Gustave IUD insertion, and any other indicated procedures. The risks of surgery were discussed in detail and she understands these to include infection; injury to adjacent organs; bleeding which may require blood transfusion; anesthesia risk; thromboembolic events; possible death; unforeseen complications; possible need for re-exploration; medical complications such as heart attack, stroke, pleural effusion and pneumonia. The patient will receive DVT and antibiotic prophylaxis as indicated. She voiced a clear understanding. She had the opportunity to ask questions. Perioperative instructions were reviewed with her. Prescriptions for post-op medications were sent to her pharmacy of choice.  The patient has basically been holding her Eliquis  in the setting of bleeding.  Discussed planning to hold this for 2 days prior to surgery and  then if no significant  bleeding after surgery, Eliquis  could be restarted.  A copy of this note was sent to the patient's referring provider.   75 minutes of total time was spent for this patient encounter, including preparation, face-to-face counseling with the patient and coordination of care, and documentation of the encounter.  Brooke [name], MD  Division of Gynecologic Oncology  Department of Obstetrics and Gynecology  [facility]  ___________________________________________  Chief Complaint: Chief Complaint  Patient presents with   Endometrial cancer [facility])    History of Present Illness:  Brooke Beasley is a 89 y.o. y.o. female who is seen in consultation at the request of Dr. Herchel for an evaluation of endometrial cancer.  The patient initially presented with PMB for 1 month after starting Black Cohosh and Primrose in the setting of taking Eliquis  for atrial fibrillation. Pelvic ultrasound showed endometrium thickened at 15 mm. She underwent EMB on 11/11. Pathology revealed FIGO grade 1 endometrioid endometrial adenocarcinoma.   Her surgical history is notable for robotic LAR and rectopexy in 2023 for rectal prolapse.    Patient comes in with her daughter today.  She reports starting primrose for itching.  She had an episode of vaginal bleeding and saw her primary care provider that same day.  She was ultimately referred to OB/GYN.  She notes that bleeding is not daily.  She uses a pure wick at night and sometimes there will be a faint stain on the PureWick or the urine will look a little bit darker.  Occasionally she has seen some blood in her underwear.  She stops taking Eliquis  when she has any bleeding.  Not currently taking her Eliquis .  She was started on norethindrone  after her biopsy.  Has noticed some breast tenderness.  Is unsure if there has been any change to her bleeding.  Has some intermittent abdominal pain in different locations  which she thinks is related to diverticulosis.  Has some constipation at baseline for which she uses laxative as needed.  Overall appetite has decreased as she has aged but denies any significant change recently.  Denies any recent weight changes.  Takes an extra dose of Lasix  if she is breathing more heavily.  Patient was told by infectious disease physician in 2023 that she should not have surgery again.  This was in the setting of discitis/abscess that developed posterior to the rectum in March 2023 (colon surgery was in 04/2021) that required long-term antibiotics.  The patient's history is also notable for C. difficile infection in 2020.  Lives with her daughter.  PAST MEDICAL HISTORY:  Past Medical History:  Diagnosis Date   Allergy  1965   Anxiety 1965   Arrhythmia    Basal cell carcinoma, arm, left 02/2023   L mid lateral posterior arm ([place])   C. difficile diarrhea 07/11/2018   S/p multiple oral vanc treatments (course and tapers) and completed Zinplava  monoclonal Ab treatment (05/10/2019) Fecal transplant on hold during COVID pandemic   CAP (community acquired pneumonia) 12/22/2017   Carotid stenosis 10/18/2014   R 1-39%, L 40-59%, rpt 1 yr (09/2014)    CKD (chronic kidney disease) stage 3, GFR 30-59 ml/min (HCC) 08/02/2014   Closed fracture of right orbit (HCC) 02/10/2021   Clostridioides difficile infection    hx 2020   COVID-19 virus infection 10/13/2020   again 10/2022   Degenerative disc disease    LS   Depression 1965   nervous breakdonw in 1964-out of work for a year   Diverticulosis  severe by colonoscopy   Family history of adverse reaction to anesthesia    n/v   GERD (gastroesophageal reflux disease) 1980   Glaucoma    Heart murmur 5/13   mitral regurge - on echo    History of hiatal hernia    History of shingles    HTN (hypertension)    Hyperlipidemia    Hypertension    Hypothyroidism    IBS 11/02/2006   Influenza A 04/20/2022   Intestinal bacterial  overgrowth    In small colon   Left shoulder pain 11/04/2014   Lower GI bleed 12/2020   thought diverticular complicated by ABLA with syncope and orbital fracture s/p hospitalization   Maxillary fracture (HCC) 04/08/2012   Osteoarthritis 2016   [facility])   Osteoporosis    dexa 2011   Perirectal abscess 08/03/2021   CT guided aspiration of abscess grow Proteums mirabilis and Citrobacter koseri 07/2021, currently receiving IV Zosyn  planned total 6 wks.   Pseudogout 2016   shoulders ([place])   Strep pharyngitis 04/20/2022     PAST SURGICAL HISTORY:  Past Surgical History:  Procedure Laterality Date   barium enema  2012   severe diverticulosis, redundant colon   Bowel obstruction  1999   no surgery in hosp x 3 days   BUBBLE STUDY  07/27/2021   Procedure: BUBBLE STUDY;  Surgeon: Claudene Pacific, MD;  Location: [facility] ENDOSCOPY;  Service: Cardiovascular;;   CARDIOVERSION N/A 11/19/2021   Procedure: CARDIOVERSION;  Surgeon: Darliss Rogue, MD;  Location: [facility] ORS;  Service: Cardiovascular;  Laterality: N/A;   COLONOSCOPY  1. 1999  2. 11/04   1. Not finished  2. Slight hemorrhage rectosigmoid area, severe sig diverticulosis   COLONOSCOPY WITH PROPOFOL  N/A 09/10/2019   SSP with dysplasia, TA, rpt 3 yrs Caprice, Corinn Skiff, MD)   Dexa  1. [phone]   2. 9/04  3. 3/08    1. OP  2. OP, borderline, spine -2.44T  3. decreased BMD-OP   DG KNEE 1-2 VIEWS BILAT     LS x-ray with degenerative disc and facet change   ESOPHAGOGASTRODUODENOSCOPY     Negative   FLEXIBLE SIGMOIDOSCOPY N/A 01/16/2021   Procedure: FLEXIBLE SIGMOIDOSCOPY;  Surgeon: Avram Lupita BRAVO, MD;  Location: [facility] ENDOSCOPY;  Service: Endoscopy;  Laterality: N/A;  or unsedated   Hemorrhoid procedure  07/2006   JOINT REPLACEMENT     PICC LINE REMOVAL ([facility] HX)  08/28/2021   PROCTOSCOPY N/A 05/26/2021   Procedure: RIGID PROCTOSCOPY;  Surgeon: Sheldon Standing, MD;  Location: [facility] ORS;  Service: General;  Laterality: N/A;   RECTOPEXY N/A  05/26/2021   Procedure: RECTOPEXY;  Surgeon: Sheldon Standing, MD;  Location: [facility] ORS;  Service: General;  Laterality: N/A;   TEE WITHOUT CARDIOVERSION N/A 07/27/2021   Procedure: TRANSESOPHAGEAL ECHOCARDIOGRAM (TEE);  Surgeon: Claudene Pacific, MD;  Location: [facility] ENDOSCOPY;  Service: Cardiovascular;  Laterality: N/A;   TONSILLECTOMY     TOTAL SHOULDER ARTHROPLASTY Left 11/27/2015   Norleen JINNY Maltos, MD   TOTAL SHOULDER REVISION Left 03/16/2016   Procedure: TOTAL SHOULDER REVISION;  Surgeon: Norleen JINNY Maltos, MD;  Location: [facility] ORS;  Service: Orthopedics;  Laterality: Left;   US  ECHOCARDIOGRAPHY  07/2011   Normal systolic fxn with EF 55-60%.  Focal basal septal hypertrophy.  Mild diastolic dysfunction.  Mild MR.   XI ROBOTIC ASSISTED LOWER ANTERIOR RESECTION N/A 05/26/2021   Procedure: ROBOTIC LOW ANTERIOR RECTOSIGMOID RESECTION; ASSESSMENT OF TISSUE PERFUSION WITH FIREFLY;  Surgeon: Sheldon Standing, MD;  Location: [facility] ORS;  Service: General;  Laterality: N/A;    OB/GYN HISTORY:  OB History  Gravida Para Term Preterm AB Living  2 2      SAB IAB Ectopic Multiple Live Births          # Outcome Date GA Lbr Len/2nd Weight Sex Type Anes PTL Lv  2 Para           1 Para             No LMP recorded. Patient is postmenopausal.  Age at menarche: 70  Age at menopause: 32 Hx of HRT: denies Hx of STDs: denies Last pap: 2006 - NILM History of abnormal pap smears: Yes, remote history of an abnormal Pap smear with likely cryotherapy treatment  SCREENING STUDIES:  Last mammogram: 2024  Last colonoscopy: 2021 Last bone mineral density: 2022  MEDICATIONS: Outpatient Encounter Medications as of 03/16/2024  Medication Sig   Ascorbic Acid  (VITAMIN C PO) Take by mouth.   bimatoprost (LUMIGAN) 0.01 % SOLN Place 1 drop into both eyes at bedtime.   carvedilol  (COREG ) 6.25 MG tablet Take 1 tablet (6.25 mg total) by mouth 2 (two) times daily with a meal.   cetirizine  (ZYRTEC ) 10 MG tablet Take 1 tablet by mouth  once daily   cholecalciferol  (VITAMIN D ) 1000 units tablet Take 1,000 Units by mouth daily.   clonazePAM  (KLONOPIN ) 0.5 MG tablet TAKE 1 TABLET BY MOUTH AT BEDTIME   colchicine  0.6 MG tablet Take 1 tablet (0.6 mg total) by mouth daily.   diclofenac  Sodium (VOLTAREN ) 1 % GEL Apply 2 g topically 4 (four) times daily.   DULoxetine  (CYMBALTA ) 20 MG capsule Take 1 capsule by mouth once daily   EPINEPHrine  (EPIPEN  2-PAK) 0.3 mg/0.3 mL IJ SOAJ injection Inject 0.3 mg into the muscle as needed for anaphylaxis. XOLAIR  INJECTION PROTOCOL   famotidine  (PEPCID ) 20 MG tablet Take 1 tablet (20 mg total) by mouth 2 (two) times daily.   furosemide  (LASIX ) 20 MG tablet Take 1 tablet (20 mg total) by mouth daily.   gabapentin  (NEURONTIN ) 300 MG capsule TAKE 1 CAPSULE BY MOUTH ONCE DAILY AS NEEDED FOR  NERVE  PAIN  (ITCHING)   hydrALAZINE  (APRESOLINE ) 25 MG tablet Take 1 tablet (25 mg total) by mouth in the morning and at bedtime.   Magnesium  250 MG CAPS Take 1 capsule by mouth at bedtime.   mometasone  (ELOCON ) 0.1 % ointment Apply pea sized amount to affected area daily as needed. Shower then ointment applied while still wet   Multiple Vitamin (MULTIVITAMIN ADULT PO) Take by mouth daily.   norethindrone  (AYGESTIN ) 5 MG tablet Take 2 tablets (10 mg total) by mouth daily.   polyethylene glycol (MIRALAX  / GLYCOLAX ) 17 g packet Take 17 g by mouth daily.   Simethicone  180 MG CAPS Take 1 capsule by mouth daily as needed (indigestion).   spironolactone  (ALDACTONE ) 25 MG tablet TAKE 1 AND 1/2 TABLETS DAILY   timolol (TIMOPTIC) 0.5 % ophthalmic solution Place 1 drop into both eyes every morning.   [DISCONTINUED] levothyroxine  (SYNTHROID ) 100 MCG tablet Take 100 mcg by mouth daily before breakfast.   [DISCONTINUED] apixaban  (ELIQUIS ) 5 MG TABS tablet Take 1 tablet (5 mg total) by mouth 2 (two) times daily. (Patient not taking: Reported on 02/18/2024)   No facility-administered encounter medications on file as of  03/16/2024.    ALLERGIES:  Allergies  Allergen Reactions   Ace Inhibitors Cough   Amlodipine  Nausea Only and Other (See Comments)    Stomach pains--feel  like my insides are on fire   Carvedilol  Other (See Comments)    fatigue   Chlorthalidone  Other (See Comments)    Severe hyponatremia   Montelukast  Other (See Comments)    Slurred speech; really drowsy   Other Other (See Comments)    No seeds or corn because of IBS   Risedronate Sodium Other (See Comments)    REACTION: joint pain   Simvastatin  Other (See Comments)    REACTION: the full 20 mg pill causes leg pain- can tol 10 mg   Prolia  [Denosumab ] Other (See Comments)    facial swelling, throat tightness, right greater than left foot swelling, right great toe numbness   Alendronate Sodium Palpitations   Influenza Vaccines Rash and Other (See Comments)    Led to mental breakdown in 1960s    Losartan  Other (See Comments)    Cr bumped   Paroxetine Hcl Other (See Comments)    Not effective - felt ill on this medicine   Silenor  [Doxepin  Hcl] Palpitations    Hallucinations and racing heart   Sulfonamide Derivatives Rash   Toprol  Xl [Metoprolol ] Diarrhea    Diarrhea and weakness     FAMILY HISTORY:  Family History  Problem Relation Age of Onset   Diabetes Brother        post-op   Coronary artery disease Brother    Uterine cancer Paternal Grandmother    Diabetes Paternal Grandfather    Breast cancer Cousin    Colon cancer Neg Hx    Ovarian cancer Neg Hx    Pancreatic cancer Neg Hx    Prostate cancer Neg Hx      SOCIAL HISTORY:  Social Connections: Moderately Isolated (02/06/2024)   Social Connection and Isolation Panel    Frequency of Communication with Friends and Family: More than three times a week    Frequency of Social Gatherings with Friends and Family: Once a week    Attends Religious Services: 1 to 4 times per year    Active Member of [employer] or Organizations: No    Attends Banker Meetings:  Not on file    Marital Status: Widowed    REVIEW OF SYSTEMS:  + Decreased appetite, hearing loss, shortness of breath, leg swelling, abdominal cramps, incontinence, vaginal bleeding, itching, easy bruising/bleeding, anxiety Denies fevers, chills, fatigue, unexplained weight changes. Denies neck lumps or masses, mouth sores, ringing in ears or voice changes. Denies cough or wheezing.   Denies chest pain or palpitations.  Denies abdominal distention, blood in stools, constipation, diarrhea, nausea, vomiting, or early satiety. Denies pain with intercourse, dysuria, frequency, hematuria. Denies hot flashes, pelvic pain or vaginal discharge.   Denies joint pain, back pain or muscle pain/cramps. Denies rash, or wounds. Denies dizziness, headaches, numbness or seizures. Denies swollen lymph nodes or glands, denies easy bruising or bleeding. Denies depression, confusion, or decreased concentration.  Physical Exam:  Vital Signs for this encounter:  Blood pressure 129/66, pulse 73, temperature 97.7 F (36.5 C), temperature source Oral, resp. rate 20, height 5' (1.524 m), weight 181 lb 6.4 oz (82.3 kg), SpO2 93%. Body mass index is 35.43 kg/m. General: Alert, oriented, no acute distress.  HEENT: Normocephalic, atraumatic. Sclera anicteric.  Chest: Clear to auscultation bilaterally. No wheezes, rhonchi, or rales. Cardiovascular: Regular rate and rhythm, no murmurs, rubs, or gallops.  Abdomen: Obese. Normoactive bowel sounds. Soft, nondistended, nontender to palpation. No masses or hepatosplenomegaly appreciated. No palpable fluid wave.  Extremities: Grossly normal range of motion. Warm, well perfused. Trace  edema bilaterally.  Lymphatics: No cervical, supraclavicular adenopathy.  GU: Deferred.  LABORATORY AND RADIOLOGIC DATA:  Outside medical records were reviewed to synthesize the above history, along with the history and physical obtained during the visit.   Lab Results  Component Value  Date   WBC 5.4 02/10/2024   HGB 16.2 (H) 02/10/2024   HCT 48.7 (H) 02/10/2024   PLT 218.0 02/10/2024   GLUCOSE 102 (H) 02/10/2024   CHOL 182 08/15/2023   TRIG 114.0 08/15/2023   HDL 61.10 08/15/2023   LDLDIRECT 86.0 09/19/2014   LDLCALC 98 08/15/2023   ALT 24 08/15/2023   AST 28 08/15/2023   NA 135 02/10/2024   K 4.6 02/10/2024   CL 95 (L) 02/10/2024   CREATININE 1.38 (H) 02/10/2024   BUN 22 02/10/2024   CO2 32 02/10/2024   TSH 4.11 02/10/2024   INR 1.1 07/24/2021   HGBA1C 6.3 02/10/2024   MICROALBUR 1.8 08/15/2023

## 2024-03-14 NOTE — H&P (View-Only) (Signed)
 GYNECOLOGIC ONCOLOGY NEW PATIENT CONSULTATION   Patient Name: Brooke Beasley  Patient Age: 89 y.o. Date of Service: 03/16/24 Referring Provider: Gloris Hugger, MD  Primary Care Provider: Rilla Baller, MD Consulting Provider: Comer [name], MD   Assessment/Plan:  Postmenopausal patient with low-grade endometrial cancer.  We reviewed the nature of endometrial cancer and its recommended surgical staging, including total hysterectomy, bilateral salpingo-oophorectomy, and lymph node assessment.   We discussed that most endometrial cancer is detected early.   Given her prior history, notably C. difficile infection and postoperative infection after her rectal surgery in 2023, as well as her medical comorbidities, discussed that he is at somewhat higher risk for major surgery.  We discussed in the setting of patients who desire fertility preservation or who are not suitable candidates for surgery, other treatment options for endometrial cancer include progestin therapy and radiation.  The patient is quite concerned about additional surgery, especially given the conversation that she had with infectious diseases about any surgery in the future.    We reviewed the role of progestin therapy and the effect on preneoplastic and neoplastic lesions, believed to include induction of apoptosis in addition to tissue sloughing during withdrawal bleeding.  Activation of the progesterone receptors is believed to lead stromal decidualization and thinning of the lining.  We reviewed the 3 most studied options, to include levonorgesterol IUD, oral medorxyprogesterone acetate, or oral megesterol acetate.    She understands that all options have few side effects, most common being infrequent edema, GI disturbances, and thromboembolic events), but that local progesterone through IUD may have a stronger effect on the endometrium with less systemic side effects.  I worry some given her symptoms already  with norethindrone  that she may have difficulty tolerating higher dose progesterone.  After our discussion, at this time, she would like to proceed with progestin therapy using a Mirena  IUD.  For monitoring, she understands that there is no recommend standard of care.  We will base her surveillance on GOG 224 protocol.  This will include EMB at 3 months following initiation of treatment.  EMBs will be performed at 3 to 6 month intervals, until a minimum of 3 negative biopsy results are obtained, after which sampling frequeny may then be yearly or until new abnormal uterine bleeding develops.  If persistence of endometrial cancer is noted by 9 months of treatment, then we will discuss additional agents or fitness for surgery.    My office will reach out to her PCP, cardiologist, and infectious disease teams about preoperative clearance for hysteroscopy, dilation and curettage, MRI and IUD placement.  We will also ask about her fitness for major surgery if we ultimately decide to proceed with definitive surgery (hysterectomy).  We will proceed with hysteroscopy, dilation and curettage, Mirena  IUD insertion, and any other indicated procedures. The risks of surgery were discussed in detail and she understands these to include infection; injury to adjacent organs; bleeding which may require blood transfusion; anesthesia risk; thromboembolic events; possible death; unforeseen complications; possible need for re-exploration; medical complications such as heart attack, stroke, pleural effusion and pneumonia. The patient will receive DVT and antibiotic prophylaxis as indicated. She voiced a clear understanding. She had the opportunity to ask questions. Perioperative instructions were reviewed with her. Prescriptions for post-op medications were sent to her pharmacy of choice.  The patient has basically been holding her Eliquis  in the setting of bleeding.  Discussed planning to hold this for 2 days prior to surgery  and then if no significant  bleeding after surgery, Eliquis  could be restarted.  A copy of this note was sent to the patient's referring provider.   75 minutes of total time was spent for this patient encounter, including preparation, face-to-face counseling with the patient and coordination of care, and documentation of the encounter.  Comer [name], MD  Division of Gynecologic Oncology  Department of Obstetrics and Gynecology  [facility]  ___________________________________________  Chief Complaint: Chief Complaint  Patient presents with   Endometrial cancer [facility])    History of Present Illness:  Brooke Beasley is a 89 y.o. y.o. female who is seen in consultation at the request of Dr. Herchel for an evaluation of endometrial cancer.  The patient initially presented with PMB for 1 month after starting Black Cohosh and Primrose in the setting of taking Eliquis  for atrial fibrillation. Pelvic ultrasound showed endometrium thickened at 15 mm. She underwent EMB on 11/11. Pathology revealed FIGO grade 1 endometrioid endometrial adenocarcinoma.   Her surgical history is notable for robotic LAR and rectopexy in 2023 for rectal prolapse.    Patient comes in with her daughter today.  She reports starting primrose for itching.  She had an episode of vaginal bleeding and saw her primary care provider that same day.  She was ultimately referred to OB/GYN.  She notes that bleeding is not daily.  She uses a pure wick at night and sometimes there will be a faint stain on the PureWick or the urine will look a little bit darker.  Occasionally she has seen some blood in her underwear.  She stops taking Eliquis  when she has any bleeding.  Not currently taking her Eliquis .  She was started on norethindrone  after her biopsy.  Has noticed some breast tenderness.  Is unsure if there has been any change to her bleeding.  Has some intermittent abdominal pain in different  locations which she thinks is related to diverticulosis.  Has some constipation at baseline for which she uses laxative as needed.  Overall appetite has decreased as she has aged but denies any significant change recently.  Denies any recent weight changes.  Takes an extra dose of Lasix  if she is breathing more heavily.  Patient was told by infectious disease physician in 2023 that she should not have surgery again.  This was in the setting of discitis/abscess that developed posterior to the rectum in March 2023 (colon surgery was in 04/2021) that required long-term antibiotics.  The patient's history is also notable for C. difficile infection in 2020.  Lives with her daughter.  PAST MEDICAL HISTORY:  Past Medical History:  Diagnosis Date   Allergy  1965   Anxiety 1965   Arrhythmia    Basal cell carcinoma, arm, left 02/2023   L mid lateral posterior arm ([place])   C. difficile diarrhea 07/11/2018   S/p multiple oral vanc treatments (course and tapers) and completed Zinplava  monoclonal Ab treatment (05/10/2019) Fecal transplant on hold during COVID pandemic   CAP (community acquired pneumonia) 12/22/2017   Carotid stenosis 10/18/2014   R 1-39%, L 40-59%, rpt 1 yr (09/2014)    CKD (chronic kidney disease) stage 3, GFR 30-59 ml/min (HCC) 08/02/2014   Closed fracture of right orbit (HCC) 02/10/2021   Clostridioides difficile infection    hx 2020   COVID-19 virus infection 10/13/2020   again 10/2022   Degenerative disc disease    LS   Depression 1965   nervous breakdonw in 1964-out of work for a year   Diverticulosis  severe by colonoscopy   Family history of adverse reaction to anesthesia    n/v   GERD (gastroesophageal reflux disease) 1980   Glaucoma    Heart murmur 5/13   mitral regurge - on echo    History of hiatal hernia    History of shingles    HTN (hypertension)    Hyperlipidemia    Hypertension    Hypothyroidism    IBS 11/02/2006   Influenza A 04/20/2022   Intestinal  bacterial overgrowth    In small colon   Left shoulder pain 11/04/2014   Lower GI bleed 12/2020   thought diverticular complicated by ABLA with syncope and orbital fracture s/p hospitalization   Maxillary fracture (HCC) 04/08/2012   Osteoarthritis 2016   [place])   Osteoporosis    dexa 2011   Perirectal abscess 08/03/2021   CT guided aspiration of abscess grow Proteums mirabilis and Citrobacter koseri 07/2021, currently receiving IV Zosyn  planned total 6 wks.   Pseudogout 2016   shoulders ([place])   Strep pharyngitis 04/20/2022     PAST SURGICAL HISTORY:  Past Surgical History:  Procedure Laterality Date   barium enema  2012   severe diverticulosis, redundant colon   Bowel obstruction  1999   no surgery in hosp x 3 days   BUBBLE STUDY  07/27/2021   Procedure: BUBBLE STUDY;  Surgeon: Claudene Pacific, MD;  Location: [facility] ENDOSCOPY;  Service: Cardiovascular;;   CARDIOVERSION N/A 11/19/2021   Procedure: CARDIOVERSION;  Surgeon: Darliss Rogue, MD;  Location: [facility] ORS;  Service: Cardiovascular;  Laterality: N/A;   COLONOSCOPY  1. 1999  2. 11/04   1. Not finished  2. Slight hemorrhage rectosigmoid area, severe sig diverticulosis   COLONOSCOPY WITH PROPOFOL  N/A 09/10/2019   SSP with dysplasia, TA, rpt 3 yrs Caprice, Corinn Skiff, MD)   Dexa  1. [phone]   2. 9/04  3. 3/08    1. OP  2. OP, borderline, spine -2.44T  3. decreased BMD-OP   DG KNEE 1-2 VIEWS BILAT     LS x-ray with degenerative disc and facet change   ESOPHAGOGASTRODUODENOSCOPY     Negative   FLEXIBLE SIGMOIDOSCOPY N/A 01/16/2021   Procedure: FLEXIBLE SIGMOIDOSCOPY;  Surgeon: Avram Lupita BRAVO, MD;  Location: [facility] ENDOSCOPY;  Service: Endoscopy;  Laterality: N/A;  or unsedated   Hemorrhoid procedure  07/2006   JOINT REPLACEMENT     PICC LINE REMOVAL ([facility] HX)  08/28/2021   PROCTOSCOPY N/A 05/26/2021   Procedure: RIGID PROCTOSCOPY;  Surgeon: Sheldon Standing, MD;  Location: [facility] ORS;  Service: General;  Laterality: N/A;    RECTOPEXY N/A 05/26/2021   Procedure: RECTOPEXY;  Surgeon: Sheldon Standing, MD;  Location: [facility] ORS;  Service: General;  Laterality: N/A;   TEE WITHOUT CARDIOVERSION N/A 07/27/2021   Procedure: TRANSESOPHAGEAL ECHOCARDIOGRAM (TEE);  Surgeon: Claudene Pacific, MD;  Location: [facility] ENDOSCOPY;  Service: Cardiovascular;  Laterality: N/A;   TONSILLECTOMY     TOTAL SHOULDER ARTHROPLASTY Left 11/27/2015   Norleen JINNY Maltos, MD   TOTAL SHOULDER REVISION Left 03/16/2016   Procedure: TOTAL SHOULDER REVISION;  Surgeon: Norleen JINNY Maltos, MD;  Location: [facility] ORS;  Service: Orthopedics;  Laterality: Left;   US  ECHOCARDIOGRAPHY  07/2011   Normal systolic fxn with EF 55-60%.  Focal basal septal hypertrophy.  Mild diastolic dysfunction.  Mild MR.   XI ROBOTIC ASSISTED LOWER ANTERIOR RESECTION N/A 05/26/2021   Procedure: ROBOTIC LOW ANTERIOR RECTOSIGMOID RESECTION; ASSESSMENT OF TISSUE PERFUSION WITH FIREFLY;  Surgeon: Sheldon Standing, MD;  Location: [facility] ORS;  Service: General;  Laterality: N/A;    OB/GYN HISTORY:  OB History  Gravida Para Term Preterm AB Living  2 2      SAB IAB Ectopic Multiple Live Births          # Outcome Date GA Lbr Len/2nd Weight Sex Type Anes PTL Lv  2 Para           1 Para             No LMP recorded. Patient is postmenopausal.  Age at menarche: 52  Age at menopause: 45 Hx of HRT: denies Hx of STDs: denies Last pap: 2006 - NILM History of abnormal pap smears: Yes, remote history of an abnormal Pap smear with likely cryotherapy treatment  SCREENING STUDIES:  Last mammogram: 2024  Last colonoscopy: 2021 Last bone mineral density: 2022  MEDICATIONS: Outpatient Encounter Medications as of 03/16/2024  Medication Sig   Ascorbic Acid  (VITAMIN C PO) Take by mouth.   bimatoprost (LUMIGAN) 0.01 % SOLN Place 1 drop into both eyes at bedtime.   carvedilol  (COREG ) 6.25 MG tablet Take 1 tablet (6.25 mg total) by mouth 2 (two) times daily with a meal.   cetirizine  (ZYRTEC ) 10 MG tablet Take 1  tablet by mouth once daily   cholecalciferol  (VITAMIN D ) 1000 units tablet Take 1,000 Units by mouth daily.   clonazePAM  (KLONOPIN ) 0.5 MG tablet TAKE 1 TABLET BY MOUTH AT BEDTIME   colchicine  0.6 MG tablet Take 1 tablet (0.6 mg total) by mouth daily.   diclofenac  Sodium (VOLTAREN ) 1 % GEL Apply 2 g topically 4 (four) times daily.   DULoxetine  (CYMBALTA ) 20 MG capsule Take 1 capsule by mouth once daily   EPINEPHrine  (EPIPEN  2-PAK) 0.3 mg/0.3 mL IJ SOAJ injection Inject 0.3 mg into the muscle as needed for anaphylaxis. XOLAIR  INJECTION PROTOCOL   famotidine  (PEPCID ) 20 MG tablet Take 1 tablet (20 mg total) by mouth 2 (two) times daily.   furosemide  (LASIX ) 20 MG tablet Take 1 tablet (20 mg total) by mouth daily.   gabapentin  (NEURONTIN ) 300 MG capsule TAKE 1 CAPSULE BY MOUTH ONCE DAILY AS NEEDED FOR  NERVE  PAIN  (ITCHING)   hydrALAZINE  (APRESOLINE ) 25 MG tablet Take 1 tablet (25 mg total) by mouth in the morning and at bedtime.   Magnesium  250 MG CAPS Take 1 capsule by mouth at bedtime.   mometasone  (ELOCON ) 0.1 % ointment Apply pea sized amount to affected area daily as needed. Shower then ointment applied while still wet   Multiple Vitamin (MULTIVITAMIN ADULT PO) Take by mouth daily.   norethindrone  (AYGESTIN ) 5 MG tablet Take 2 tablets (10 mg total) by mouth daily.   polyethylene glycol (MIRALAX  / GLYCOLAX ) 17 g packet Take 17 g by mouth daily.   Simethicone  180 MG CAPS Take 1 capsule by mouth daily as needed (indigestion).   spironolactone  (ALDACTONE ) 25 MG tablet TAKE 1 AND 1/2 TABLETS DAILY   timolol (TIMOPTIC) 0.5 % ophthalmic solution Place 1 drop into both eyes every morning.   [DISCONTINUED] levothyroxine  (SYNTHROID ) 100 MCG tablet Take 100 mcg by mouth daily before breakfast.   [DISCONTINUED] apixaban  (ELIQUIS ) 5 MG TABS tablet Take 1 tablet (5 mg total) by mouth 2 (two) times daily. (Patient not taking: Reported on 02/18/2024)   No facility-administered encounter medications on file  as of 03/16/2024.    ALLERGIES:  Allergies  Allergen Reactions   Ace Inhibitors Cough   Amlodipine  Nausea Only and Other (See Comments)    Stomach pains--feel  like my insides are on fire   Carvedilol  Other (See Comments)    fatigue   Chlorthalidone  Other (See Comments)    Severe hyponatremia   Montelukast  Other (See Comments)    Slurred speech; really drowsy   Other Other (See Comments)    No seeds or corn because of IBS   Risedronate Sodium Other (See Comments)    REACTION: joint pain   Simvastatin  Other (See Comments)    REACTION: the full 20 mg pill causes leg pain- can tol 10 mg   Prolia  [Denosumab ] Other (See Comments)    facial swelling, throat tightness, right greater than left foot swelling, right great toe numbness   Alendronate Sodium Palpitations   Influenza Vaccines Rash and Other (See Comments)    Led to mental breakdown in 1960s    Losartan  Other (See Comments)    Cr bumped   Paroxetine Hcl Other (See Comments)    Not effective - felt ill on this medicine   Silenor  [Doxepin  Hcl] Palpitations    Hallucinations and racing heart   Sulfonamide Derivatives Rash   Toprol  Xl [Metoprolol ] Diarrhea    Diarrhea and weakness     FAMILY HISTORY:  Family History  Problem Relation Age of Onset   Diabetes Brother        post-op   Coronary artery disease Brother    Uterine cancer Paternal Grandmother    Diabetes Paternal Grandfather    Breast cancer Cousin    Colon cancer Neg Hx    Ovarian cancer Neg Hx    Pancreatic cancer Neg Hx    Prostate cancer Neg Hx      SOCIAL HISTORY:  Social Connections: Moderately Isolated (02/06/2024)   Social Connection and Isolation Panel    Frequency of Communication with Friends and Family: More than three times a week    Frequency of Social Gatherings with Friends and Family: Once a week    Attends Religious Services: 1 to 4 times per year    Active Member of [employer] or Organizations: No    Attends Banker  Meetings: Not on file    Marital Status: Widowed    REVIEW OF SYSTEMS:  + Decreased appetite, hearing loss, shortness of breath, leg swelling, abdominal cramps, incontinence, vaginal bleeding, itching, easy bruising/bleeding, anxiety Denies fevers, chills, fatigue, unexplained weight changes. Denies neck lumps or masses, mouth sores, ringing in ears or voice changes. Denies cough or wheezing.   Denies chest pain or palpitations.  Denies abdominal distention, blood in stools, constipation, diarrhea, nausea, vomiting, or early satiety. Denies pain with intercourse, dysuria, frequency, hematuria. Denies hot flashes, pelvic pain or vaginal discharge.   Denies joint pain, back pain or muscle pain/cramps. Denies rash, or wounds. Denies dizziness, headaches, numbness or seizures. Denies swollen lymph nodes or glands, denies easy bruising or bleeding. Denies depression, confusion, or decreased concentration.  Physical Exam:  Vital Signs for this encounter:  Blood pressure 129/66, pulse 73, temperature 97.7 F (36.5 C), temperature source Oral, resp. rate 20, height 5' (1.524 m), weight 181 lb 6.4 oz (82.3 kg), SpO2 93%. Body mass index is 35.43 kg/m. General: Alert, oriented, no acute distress.  HEENT: Normocephalic, atraumatic. Sclera anicteric.  Chest: Clear to auscultation bilaterally. No wheezes, rhonchi, or rales. Cardiovascular: Regular rate and rhythm, no murmurs, rubs, or gallops.  Abdomen: Obese. Normoactive bowel sounds. Soft, nondistended, nontender to palpation. No masses or hepatosplenomegaly appreciated. No palpable fluid wave.  Extremities: Grossly normal range of motion. Warm, well perfused. Trace  edema bilaterally.  Lymphatics: No cervical, supraclavicular adenopathy.  GU: Deferred.  LABORATORY AND RADIOLOGIC DATA:  Outside medical records were reviewed to synthesize the above history, along with the history and physical obtained during the visit.   Lab Results   Component Value Date   WBC 5.4 02/10/2024   HGB 16.2 (H) 02/10/2024   HCT 48.7 (H) 02/10/2024   PLT 218.0 02/10/2024   GLUCOSE 102 (H) 02/10/2024   CHOL 182 08/15/2023   TRIG 114.0 08/15/2023   HDL 61.10 08/15/2023   LDLDIRECT 86.0 09/19/2014   LDLCALC 98 08/15/2023   ALT 24 08/15/2023   AST 28 08/15/2023   NA 135 02/10/2024   K 4.6 02/10/2024   CL 95 (L) 02/10/2024   CREATININE 1.38 (H) 02/10/2024   BUN 22 02/10/2024   CO2 32 02/10/2024   TSH 4.11 02/10/2024   INR 1.1 07/24/2021   HGBA1C 6.3 02/10/2024   MICROALBUR 1.8 08/15/2023

## 2024-03-15 MED ORDER — LEVOTHYROXINE SODIUM 100 MCG PO TABS: 100.0000 ug | ORAL_TABLET | Freq: Every day | ORAL | 3 refills | Status: AC

## 2024-03-16 ENCOUNTER — Inpatient Hospital Stay: Attending: Gynecologic Oncology | Admitting: Gynecologic Oncology

## 2024-03-16 ENCOUNTER — Encounter: Payer: Self-pay | Admitting: Gynecologic Oncology

## 2024-03-16 ENCOUNTER — Telehealth: Payer: Self-pay | Admitting: *Deleted

## 2024-03-16 ENCOUNTER — Encounter: Payer: Self-pay | Admitting: Oncology

## 2024-03-16 ENCOUNTER — Telehealth: Payer: Self-pay

## 2024-03-16 ENCOUNTER — Inpatient Hospital Stay: Admitting: Oncology

## 2024-03-16 ENCOUNTER — Telehealth (HOSPITAL_BASED_OUTPATIENT_CLINIC_OR_DEPARTMENT_OTHER): Payer: Self-pay

## 2024-03-16 VITALS — BP 129/66 | HR 73 | Temp 97.7°F | Resp 20 | Ht 60.0 in | Wt 181.4 lb

## 2024-03-16 DIAGNOSIS — Z8616 Personal history of COVID-19: Secondary | ICD-10-CM | POA: Diagnosis not present

## 2024-03-16 DIAGNOSIS — K219 Gastro-esophageal reflux disease without esophagitis: Secondary | ICD-10-CM | POA: Insufficient documentation

## 2024-03-16 DIAGNOSIS — Z7951 Long term (current) use of inhaled steroids: Secondary | ICD-10-CM | POA: Insufficient documentation

## 2024-03-16 DIAGNOSIS — Z8049 Family history of malignant neoplasm of other genital organs: Secondary | ICD-10-CM | POA: Insufficient documentation

## 2024-03-16 DIAGNOSIS — N950 Postmenopausal bleeding: Secondary | ICD-10-CM | POA: Insufficient documentation

## 2024-03-16 DIAGNOSIS — I4891 Unspecified atrial fibrillation: Secondary | ICD-10-CM | POA: Insufficient documentation

## 2024-03-16 DIAGNOSIS — Z7901 Long term (current) use of anticoagulants: Secondary | ICD-10-CM | POA: Diagnosis not present

## 2024-03-16 DIAGNOSIS — M51379 Other intervertebral disc degeneration, lumbosacral region without mention of lumbar back pain or lower extremity pain: Secondary | ICD-10-CM | POA: Insufficient documentation

## 2024-03-16 DIAGNOSIS — I129 Hypertensive chronic kidney disease with stage 1 through stage 4 chronic kidney disease, or unspecified chronic kidney disease: Secondary | ICD-10-CM | POA: Diagnosis not present

## 2024-03-16 DIAGNOSIS — I499 Cardiac arrhythmia, unspecified: Secondary | ICD-10-CM | POA: Insufficient documentation

## 2024-03-16 DIAGNOSIS — Z803 Family history of malignant neoplasm of breast: Secondary | ICD-10-CM | POA: Diagnosis not present

## 2024-03-16 DIAGNOSIS — N183 Chronic kidney disease, stage 3 unspecified: Secondary | ICD-10-CM | POA: Insufficient documentation

## 2024-03-16 DIAGNOSIS — E785 Hyperlipidemia, unspecified: Secondary | ICD-10-CM | POA: Insufficient documentation

## 2024-03-16 DIAGNOSIS — Z85828 Personal history of other malignant neoplasm of skin: Secondary | ICD-10-CM | POA: Insufficient documentation

## 2024-03-16 DIAGNOSIS — F419 Anxiety disorder, unspecified: Secondary | ICD-10-CM | POA: Diagnosis not present

## 2024-03-16 DIAGNOSIS — Z791 Long term (current) use of non-steroidal anti-inflammatories (NSAID): Secondary | ICD-10-CM | POA: Insufficient documentation

## 2024-03-16 DIAGNOSIS — E039 Hypothyroidism, unspecified: Secondary | ICD-10-CM | POA: Diagnosis not present

## 2024-03-16 DIAGNOSIS — Z8619 Personal history of other infectious and parasitic diseases: Secondary | ICD-10-CM

## 2024-03-16 DIAGNOSIS — C541 Malignant neoplasm of endometrium: Secondary | ICD-10-CM | POA: Insufficient documentation

## 2024-03-16 DIAGNOSIS — I4819 Other persistent atrial fibrillation: Secondary | ICD-10-CM

## 2024-03-16 DIAGNOSIS — Z79899 Other long term (current) drug therapy: Secondary | ICD-10-CM | POA: Diagnosis not present

## 2024-03-16 NOTE — Progress Notes (Signed)
 Requested p53 and MMR testing on accession [id number] with [facility].

## 2024-03-16 NOTE — Patient Instructions (Signed)
 SURGICAL WAITING ROOM VISITATION Patients having surgery or a procedure may have no more than 2 support people in the waiting area - these visitors may rotate.    Children under the age of 46 must have an adult with them who is not the patient.  If the patient needs to stay at the hospital during part of their recovery, the visitor guidelines for inpatient rooms apply. Pre-op nurse will coordinate an appropriate time for 1 support person to accompany patient in pre-op.  This support person may not rotate.    Please refer to the [facility] website for the visitor guidelines for Inpatients (after your surgery is over and you are in a regular room).       Your procedure is scheduled on: 03-21-24   Report to [facility] Main Entrance    Report to admitting at 12:45 PM   Call this number if you have problems the morning of surgery [phone]   Do not eat food :After Midnight.   After Midnight you may have the following liquids until 12:00  PM DAY OF SURGERY  Water Non-Citrus Juices (without pulp, NO RED-Apple, White grape, White cranberry) Black Coffee (NO MILK/CREAM OR CREAMERS, sugar ok)  Clear Tea (NO MILK/CREAM OR CREAMERS, sugar ok) regular and decaf                             Plain Jell-O (NO RED)                                           Fruit ices (not with fruit pulp, NO RED)                                     Popsicles (NO RED)                                                               Sports drinks like Gatorade (NO RED)                      If you have questions, please contact your surgeon's office.   FOLLOW ANY ADDITIONAL PRE OP INSTRUCTIONS YOU RECEIVED FROM YOUR SURGEON'S OFFICE!!!     Oral Hygiene is also important to reduce your risk of infection.                                    Remember - BRUSH YOUR TEETH THE MORNING OF SURGERY WITH YOUR REGULAR TOOTHPASTE   Do NOT smoke after Midnight   Take these medicines the morning of surgery with A  SIP OF WATER:    Carvedilol    Colchicine    Duloxetine    Pepcid    Gabapentin    Hydralazine    Levothyroxine    Zyrtec    Okay to use eyedrops  Stop all vitamins and herbal supplements 7 days before surgery  Bring CPAP mask and tubing day of surgery.  You may not have any metal on your body including hair pins, jewelry, and body piercing             Do not wear make-up, lotions, powders, perfumes or deodorant  Do not wear nail polish including gel and S&S, artificial/acrylic nails, or any other type of covering on natural nails including finger and toenails. If you have artificial nails, gel coating, etc. that needs to be removed by a nail salon please have this removed prior to surgery or surgery may need to be canceled/ delayed if the surgeon/ anesthesia feels like they are unable to be safely monitored.   Do not shave  48 hours prior to surgery.    Do not bring valuables to the hospital. [facility] IS NOT RESPONSIBLE   FOR VALUABLES.   Contacts, dentures or bridgework may not be worn into surgery.  DO NOT BRING YOUR HOME MEDICATIONS TO THE HOSPITAL. PHARMACY WILL DISPENSE MEDICATIONS LISTED ON YOUR MEDICATION LIST TO YOU DURING YOUR ADMISSION IN THE HOSPITAL!    Patients discharged on the day of surgery will not be allowed to drive home.  Someone NEEDS to stay with you for the first 24 hours after anesthesia.   Special Instructions: Bring a copy of your healthcare power of attorney and living will documents the day of surgery if you haven't scanned them before.              Please read over the following fact sheets you were given: IF YOU HAVE QUESTIONS ABOUT YOUR PRE-OP INSTRUCTIONS PLEASE CALL [phone] [facility]  If you received a COVID test during your pre-op visit  it is requested that you wear a mask when out in public, stay away from anyone that may not be feeling well and notify your surgeon if you develop symptoms. If you test positive for Covid  or have been in contact with anyone that has tested positive in the last 10 days please notify you surgeon.  [place] - Preparing for Surgery Before surgery, you can play an important role.  Because skin is not sterile, your skin needs to be as free of germs as possible.  You can reduce the number of germs on your skin by washing with CHG (chlorahexidine gluconate) soap before surgery.  CHG is an antiseptic cleaner which kills germs and bonds with the skin to continue killing germs even after washing. Please DO NOT use if you have an allergy  to CHG or antibacterial soaps.  If your skin becomes reddened/irritated stop using the CHG and inform your nurse when you arrive at Short Stay. Do not shave (including legs and underarms) for at least 48 hours prior to the first CHG shower.  You may shave your face/neck.  Please follow these instructions carefully:  1.  Shower with CHG Soap the night before surgery and the  morning of surgery.  2.  If you choose to wash your hair, wash your hair first as usual with your normal  shampoo.  3.  After you shampoo, rinse your hair and body thoroughly to remove the shampoo.                             4.  Use CHG as you would any other liquid soap.  You can apply chg directly to the skin and wash.  Gently with a scrungie or clean washcloth.  5.  Apply the CHG Soap to your body ONLY FROM THE  NECK DOWN.   Do   not use on face/ open                           Wound or open sores. Avoid contact with eyes, ears mouth and   genitals (private parts).                       Wash face,  Genitals (private parts) with your normal soap.             6.  Wash thoroughly, paying special attention to the area where your    surgery  will be performed.  7.  Thoroughly rinse your body with warm water from the neck down.  8.  DO NOT shower/wash with your normal soap after using and rinsing off the CHG Soap.                9.  Pat yourself dry with a clean towel.            10.  Wear  clean pajamas.            11.  Place clean sheets on your bed the night of your first shower and do not  sleep with pets. Day of Surgery : Do not apply any lotions/deodorants the morning of surgery.  Please wear clean clothes to the hospital/surgery center.  FAILURE TO FOLLOW THESE INSTRUCTIONS MAY RESULT IN THE CANCELLATION OF YOUR SURGERY  PATIENT SIGNATURE_________________________________  NURSE SIGNATURE__________________________________  ________________________________________________________________________

## 2024-03-16 NOTE — Progress Notes (Signed)
 Patient here for a consult with Dr. Viktoria for a pre-operative appointment prior to her scheduled surgery on 03/21/2024. She is scheduled for a dilation and curettage of the uterus with hysteroscopy and myosure and Mirena  IUD insertion. The surgery was discussed in detail.  See after visit summary for additional details.      Discussed post-op pain management in detail including the aspects of the enhanced recovery pathway.  We discussed the use of tylenol  post-op and to monitor for a maximum of 4,000 mg in a 24 hour period.  Discussed bowel regimen in detail.     Discussed the use of SCDs and measures to take at home to prevent DVT including frequent mobility.  Reportable signs and symptoms of DVT discussed. Post-operative instructions discussed and expectations for after surgery. Incisional care discussed as well including reportable signs and symptoms including erythema, drainage, wound separation.     30 minutes spent with the patient.  Verbalizing understanding of material discussed. No needs or concerns voiced at the end of the visit.   Advised patient to call for any needs.  Advised that her post-operative medications had been prescribed and could be picked up at any time.    This appointment is included in the global surgical bundle as pre-operative teaching and has no charge.

## 2024-03-16 NOTE — Telephone Encounter (Signed)
   Pre-operative Risk Assessment    Patient Name: Brooke Beasley  DOB: 07/13/1930 MRN: [id number]   Date of last office visit: 09/15/23 with Dr. Raford  Date of next office visit: NA  Request for Surgical Clearance    Procedure:  Dilation and curettage of the uterus with hysteroscopy and myosure with mirena  IUD placement  Date of Surgery:  Clearance 03/21/24                                 Surgeon:  Dr. Viktoria Socks Group or Practice Name:  [facility] Phone number:  [phone] Fax number:  [phone]   Type of Clearance Requested:   - Medical    Type of Anesthesia:  General   Additional requests/questions:  Would the patient also be cleared for possible robotic assisted total laparoscopic hysterectomy down the road  Signed, Augustin JONETTA Daring   03/16/2024, 4:33 PM

## 2024-03-16 NOTE — Telephone Encounter (Signed)
 I put a surgical optimization form in your basket for review.

## 2024-03-16 NOTE — Progress Notes (Signed)
 Date of COVID positive in last 90 days:  PCP - Anton Blas, MD Cardiologist - Annabella Scarce, MD Pulmonologist - Devona Skiff, MD  Chest x-ray - 09-15-23 Epic EKG - 07-04-23 Epic Stress Test - 10-21-22 Epic ECHO - 10-14-23 Epic Cardiac Cath - N/A Long Term Monitor - 07-18-23 Epic Pacemaker/ICD device last checked:N/A Spinal Cord Stimulator:N/A  Bowel Prep - N/A  Sleep Study - Yes, +complex sleep apnea CPAP -   Prediabetes Fasting Blood Sugar - N/A Checks Blood Sugar _____ times a day  Last dose of GLP1 agonist-  N/A GLP1 instructions:  Do not take after     Last dose of SGLT-2 inhibitors-  N/A SGLT-2 instructions:  Do not take after     Blood Thinner Instructions: N/A Last dose:   Time: Aspirin Instructions:N/A Last Dose:  Activity level:  Can go up a flight of stairs and perform activities of daily living without stopping and without symptoms of chest pain or shortness of breath.  Able to exercise without symptoms  Unable to go up a flight of stairs without symptoms of     Anesthesia review:  Afib, murmur,  moderate aortic stenosis, carotid stenosis, HTN, CKD  Patient denies shortness of breath, fever, cough and chest pain at PAT appointment  Patient verbalized understanding of instructions that were given to them at the PAT appointment. Patient was also instructed that they will need to review over the PAT instructions again at home before surgery.

## 2024-03-16 NOTE — Patient Instructions (Addendum)
 Preparing for your Surgery   Plan for surgery on March 21, 2024 with Dr. Comer [name] at [facility]. You will be scheduled for dilation and curettage of the uterus with hysteroscopy and myosure and Mirena  IUD insertion.    Pre-operative Testing -You will receive a phone call from presurgical testing at [facility] to discuss surgery instructions and arrange for lab work if needed.   -Bring your insurance card, copy of an advanced directive if applicable, medication list.   -You should not be taking blood thinners or aspirin at least ten days prior to surgery unless instructed by your surgeon.   -Do not take supplements such as fish oil (omega 3), red yeast rice, turmeric before your surgery. You want to avoid medications with aspirin in them including headache powders such as BC or Goody's), Excedrin migraine.   Day Before Surgery at Home -You will be advised you can have clear liquids up until 3 hours before your surgery.     Your role in recovery Your role is to become active as soon as directed by your doctor, while still giving yourself time to heal.  Rest when you feel tired. You will be asked to do the following in order to speed your recovery:   - Cough and breathe deeply. This helps to clear and expand your lungs and can prevent pneumonia after surgery.  - STAY ACTIVE WHEN YOU GET HOME. Do mild physical activity. Walking or moving your legs help your circulation and body functions return to normal. Do not try to get up or walk alone the first time after surgery.   -If you develop swelling on one leg or the other, pain in the back of your leg, redness/warmth in one of your legs, please call the office or go to the Emergency Room to have a doppler to rule out a blood clot. For shortness of breath, chest pain-seek care in the Emergency Room as soon as possible. - Actively manage your pain. Managing your pain lets you move in comfort. We will ask you to rate your  pain on a scale of zero to 10. It is your responsibility to tell your doctor or nurse where and how much you hurt so your pain can be treated.   Special Considerations -Your final pathology results from surgery should be available around one week after surgery and the results will be relayed to you when available.   -FMLA forms can be faxed to [phone] and please allow 5-7 business days for completion.   Pain Management After Surgery -Make sure that you have Tylenol  and Ibuprofen  at home IF YOU ARE ABLE TO TAKE THESE MEDICATIONS to use on a regular basis after surgery for pain control.     Bowel Regimen -It is important to prevent constipation and drink adequate amounts of liquids. You can use a laxative as needed for constipation.   Risks of Surgery Risks of surgery are low but include bleeding, infection, damage to surrounding structures, re-operation, blood clots, and very rarely death.   AFTER SURGERY INSTRUCTIONS   Return to work:  1-2 days if applicable   Activity: 1. Be up and out of the bed during the day.  Take a nap if needed.  You may walk up steps but be careful and use the hand rail.  Stair climbing will tire you more than you think, you may need to stop part way and rest.    2. No lifting or straining for 2 weeks over  10 pounds. No pushing, pulling, straining for 2 weeks.   3. No driving for minimum 24 hours after surgery.  Do not drive if you are taking narcotic pain medicine and make sure that your reaction time has returned.    4. You can shower as soon as the next day after surgery. Shower daily. No tub baths or submerging your body in water until cleared by your surgeon (2 weeks minimum). If you have the soap that was given to you by pre-surgical testing that was used before surgery, you do not need to use it afterwards because this can irritate your incisions.    5. No sexual activity and nothing in the vagina for 2 weeks.   6. You may experience vaginal  spotting and discharge after surgery.  The spotting is normal but if you experience heavy bleeding, call our office.   7. Take Tylenol  and ibuprofen  for pain if you are able to take these medications for discomfort as needed.     Diet: 1. Low sodium Heart Healthy Diet is recommended but you are cleared to resume your normal (before surgery) diet after your procedure.   2. It is safe to use a laxative, such as Miralax  or Colace, if you have difficulty moving your bowels.    Wound Care: 1. Keep clean and dry.  Shower daily.   Reasons to call the Doctor: Fever - Oral temperature greater than 100.4 degrees Fahrenheit Foul-smelling vaginal discharge Difficulty urinating Nausea and vomiting Increased pain at the site of the incision that is unrelieved with pain medicine. Difficulty breathing with or without chest pain New calf pain especially if only on one side Sudden, continuing increased vaginal bleeding with or without clots.   Contacts: For questions or concerns you should contact:   Dr. Comer [name] at [phone]   [name], NP at [phone]   After Hours: call [phone] and have the GYN Oncologist paged/contacted (after 5 pm or on the weekends).   Messages sent via mychart are for non-urgent matters and are not responded to after hours so for urgent needs, please call the after hours number.

## 2024-03-16 NOTE — Telephone Encounter (Signed)
 Per Dr Viktoria fax surgical optimization form to the patient's PCP office (Dr Rilla [phone]) and the cardiology office (Dr Margrette [phone])

## 2024-03-19 ENCOUNTER — Telehealth: Payer: Self-pay

## 2024-03-19 ENCOUNTER — Ambulatory Visit (HOSPITAL_BASED_OUTPATIENT_CLINIC_OR_DEPARTMENT_OTHER): Admitting: Nurse Practitioner

## 2024-03-19 ENCOUNTER — Encounter (HOSPITAL_BASED_OUTPATIENT_CLINIC_OR_DEPARTMENT_OTHER): Payer: Self-pay | Admitting: Nurse Practitioner

## 2024-03-19 ENCOUNTER — Inpatient Hospital Stay: Admitting: Psychiatry

## 2024-03-19 VITALS — BP 118/72 | HR 80 | Ht 60.0 in | Wt 182.3 lb

## 2024-03-19 DIAGNOSIS — Z01818 Encounter for other preprocedural examination: Secondary | ICD-10-CM | POA: Diagnosis not present

## 2024-03-19 DIAGNOSIS — I5032 Chronic diastolic (congestive) heart failure: Secondary | ICD-10-CM | POA: Diagnosis not present

## 2024-03-19 DIAGNOSIS — I350 Nonrheumatic aortic (valve) stenosis: Secondary | ICD-10-CM

## 2024-03-19 DIAGNOSIS — I4892 Unspecified atrial flutter: Secondary | ICD-10-CM | POA: Diagnosis not present

## 2024-03-19 DIAGNOSIS — I150 Renovascular hypertension: Secondary | ICD-10-CM | POA: Diagnosis not present

## 2024-03-19 DIAGNOSIS — N1832 Chronic kidney disease, stage 3b: Secondary | ICD-10-CM

## 2024-03-19 DIAGNOSIS — I4811 Longstanding persistent atrial fibrillation: Secondary | ICD-10-CM | POA: Diagnosis not present

## 2024-03-19 MED ORDER — GABAPENTIN 300 MG PO CAPS: ORAL_CAPSULE | ORAL | 3 refills | Status: AC

## 2024-03-19 NOTE — Telephone Encounter (Signed)
 I s/w the pt and stated that we received late on Friday a preop clearance request for procedure with Dr. Viktoria. I informed the pt that she is going to need an appt in office before she can be cleared. I did inform her that we could see her today at 2:45. She said she needs to s/w her daughter to see if she can bring her in. I also offered tomorrow AM as well, though that is just 1 day before her procedure with Dr. Viktoria.     Pt will call back after she s/w her daughter and schedule appt either today or tomorrow.   I will update the requesting office as FYI as well.

## 2024-03-19 NOTE — Telephone Encounter (Signed)
 Filled out and in CMA box - please fax tomorrow am.   Preop evaluation: This is not a high risk surgery (intraperitoneal, intrathoracic, or suprainguinal vascular) No history of ischemic heart disease, congestive heart failure, cerebrovascular disease, preoperative insulin  use, or chronic kidney disease with preop creatinine >2.  Revised cardiac risk index score = 0

## 2024-03-19 NOTE — Progress Notes (Signed)
 Cardiology Office Note   Date:  03/19/2024  ID:  Brooke, Beasley 24-May-1930, MRN [id number] PCP: Rilla Baller, MD  [facility] Providers Cardiologist:  None Electrophysiologist:  OLE ONEIDA HOLTS, MD     PMH Persistent atrial flutter/fibrillation OSA on BiPAP HFpEF Hypertension Hyperlipidemia CKD Stage 3 Mild aortic stenosis Anxiety  She first establish care in the advanced hypertension clinic 09/2022.  She has a history of atrial fibrillation with slow ventricular rates so carvedilol  was Doose to 3.125 mg twice daily.  BP was elevated when she saw Dr. Darliss on 06/2022 but has generally been well-controlled.  At office visit 09/2022 she noted blood pressures were very labile ranging from the 90s to the 200s.  It started to become more difficult to control after surgery for rectal prolapse.  She also noted exertional dyspnea.  Amlodipine  was increased to twice daily.  Echo 11/2022 revealed LVEF 65 to 70% with mild LVH and mildly reduced RV function, mild posterior mitral valve leaflet prolapse and mild to moderate MR.  Also noted mild to moderate aortic stenosis.  Nuclear stress testing was low risk.  She discontinued amlodipine  due to itching.  At office visit 03/2023 her main complaint was severe pruritus.  Initially improved after stopping amlodipine  but then recurred.  BP at that visit was 162/94 and home average was in the 140s.  Doxazosin  and spironolactone  were both discontinued and carvedilol  was increased.  At office visit 04/2023 BP was ranging from 90s to 150s.  She felt weak when her blood pressure was low and also had a fall.  Office BP was 144/80.  Carvedilol  was reduced to 6.25 mg due to low heart rates and spironolactone  was increased.  BP log averaging 144/96, average HR in the 50s.  She continued to note itching.  Hydralazine  10 mg twice daily was added.  Cardiac monitor revealed 100% atrial fibrillation burden and episodes of bradycardia in the  30s.  Carvedilol  was decreased and hydralazine  was increased to 25 mg.  Last cardiology clinic visit was 09/15/2023 at which time she reported chest pain exacerbated by deep inspiration.  CRP and sed rate were normal.  She had a chest x-ray but unfortunately it appears it was never read by radiology.  TTE 10/14/2023 with LVEF 70 to 75%, mild LVH of basal septal segment, moderate BAE, moderate MR, mild holosystolic prolapse of posterior leaflet of MV, moderate AS with AV mean gradient 16 mmHg, AVA by VTI 1.12 cm.   Previous antihypertensives: ACE-I Carvedilol  Chlorthalidone  Losartan  metoprolol  Atenolol  Chlorthalidone  Hydrochlorothiazide  Irbesartan  Imdur  Hydrochlorothiazide -triamterene  Hydralazine  Doxazosin -patient  History of Present Illness  Discussed the use of AI scribe software for clinical note transcription with the patient, who gave verbal consent to proceed.  History of Present Illness Brooke Beasley is a very pleasant 89 year old female with atrial fibrillation who presents for pre-operative cardiac evaluation.  She is accompanied by her daughter. She experiences occasional chest pain described as feeling like 'pens', not exacerbated by physical activity. Intermittent shortness of breath occurs, with improvement noted in the afternoon. She denies palpitations, orthopnea, PND, edema, presyncope or syncope. She has been off Eliquis  due to vaginal bleeding and is now scheduled  surgery for endometrial cancer.  She reports she is overall feeling well but admits she is not very active at home.  She does not report significant symptoms with walking but says it is just easier to relax in her chair. Lasix  was recently increased to 40 mg daily for fluid retention  and weight gain, resulting in increased urination. Her mouth often feels dry, and she maintains regular hydration throughout the day.  She has not been weight daily. Drinks about 4-5 8 ounce glasses of water daily.   ROS: See  HPI  Studies Reviewed EKG Interpretation Date/Time:  Monday March 19 2024 14:46:41 EST Ventricular Rate:  80 PR Interval:    QRS Duration:  78 QT Interval:  370 QTC Calculation: 426 R Axis:   1  Text Interpretation: Atrial fibrillation When compared with ECG of 19-Nov-2021 07:36, PREVIOUS ECG IS PRESENT Confirmed by Percy Browning [id number]) on 03/19/2024 3:17:52 PM     No results found for: LIPOA  Risk Assessment/Calculations  CHA2DS2-VASc Score = 5   This indicates a 7.2% annual risk of stroke. The patient's score is based upon: CHF History: 1 HTN History: 1 Diabetes History: 0 Stroke History: 0 Vascular Disease History: 0 Age Score: 2 Gender Score: 1            Physical Exam VS:  BP 118/72 (BP Location: Left Arm, Cuff Size: Large)   Pulse 80   Ht 5' (1.524 m)   Wt 182 lb 4.8 oz (82.7 kg)   SpO2 98%   BMI 35.60 kg/m    Wt Readings from Last 3 Encounters:  03/19/24 182 lb 4.8 oz (82.7 kg)  03/16/24 181 lb 6.4 oz (82.3 kg)  03/06/24 185 lb (83.9 kg)    GEN: Well nourished, well developed in no acute distress NECK: No JVD; No carotid bruits CARDIAC: Irregular RR, no murmurs, rubs, gallops RESPIRATORY:  Clear to auscultation without rales, wheezing or rhonchi  ABDOMEN: Soft, non-tender, non-distended EXTREMITIES:  No edema; No deformity   Assessment & Plan Preoperative cardiovascular evaluation According to the Revised Cardiac Risk Index (RCRI), her Perioperative Risk of Major Cardiac Event is (%): 0.9. Her Functional Capacity in METs is: 4.4 according to the Duke Activity Status Index (DASI). She admits to being sedentary at home. We walked around the office, approximately 800 feet which she was able to do with no problem.  She does not have any concerning cardiac symptoms with exertion. Therefore, based on ACC/AHA guidelines, the patient would be at acceptable risk for the planned procedure without further cardiovascular testing.  She is holding Eliquis  in  the setting of vaginal bleeding.  She should resume Eliquis  as soon as hemodynamically stable postprocedure.  I will forward clearance to requesting provider.  Longstanding atrial fibrillation   Overall asymptomatic with atrial fibrillation.  HR is well-controlled. Anticoagulation has been held for several weeks due to vaginal bleeding.  She is undergoing surgery for endometrial cancer on 03/21/2024 and should resume Eliquis  as soon as hemodynamically stable postprocedure for stroke prevention for CHA2DS2-VASc score of 5. - Resume Eliquis  5 mg twice daily which is appropriate dose for stroke prevention  Hypertension   CKD Stage 3b History of labile BP, however in clinic today BP is well-controlled. Reports home reading this morning was 150 mmHg. Suspect home machine may not be calibrated.  No change in antihypertensive therapy today. - Continue carvedilol , hydralazine , Lasix , spironolactone  - Has BMET scheduled for tomorrow   Aortic stenosis HFpEF Reports recent increase in swelling which improved with additional Lasix . Has not monitored weight consistently, but feels it has improved with increased diuresis. Fluid intake does not exceed 64 oz daily.  Able to walk without dyspnea or chest pain.  Echo 10/14/2023 with hyperdynamic LV function, mild LVH of basal-septal segment, unable to evaluate diastolic function, elevated left  atrial pressure, moderate aortic stenosis with mean gradient 16 mmHg.  She denies PND, orthopnea, significant edema.  No evidence of volume overload on exam.  Kidney function will be checked preoperatively which I will evaluate for guidance on continued diuresis. -  Continue Lasix  40 mg daily if kidney function is stable on labs 03/20/24 - Monitor weight daily and notify us  of significant increase in weight - Increasing physical activity to include regular walks around her home was encouraged        Dispo: 6 months with Dr. Raford or APP  Signed, Rosaline Bane,  NP-C

## 2024-03-19 NOTE — Telephone Encounter (Signed)
   Name: Brooke Beasley  DOB: 1931-02-25  MRN: [id number]  Primary Cardiologist: None  Chart reviewed as part of pre-operative protocol coverage. Because of [name]'s past medical history and time since last visit, she will require a follow-up in-office visit in order to better assess preoperative cardiovascular risk.  Last seen 5/22 by Dr. Raford.  Patient reported chest pain at that time and needed 31-month follow-up.  If available please schedule with Reche Finder or Dr. Raford.  Pre-op covering staff: - Please schedule appointment and call patient to inform them. If patient already had an upcoming appointment within acceptable timeframe, please add pre-op clearance to the appointment notes so provider is aware. - Please contact requesting surgeon's office via preferred method (i.e, phone, fax) to inform them of need for appointment prior to surgery.  [name], NP  03/19/2024, 8:22 AM

## 2024-03-19 NOTE — Telephone Encounter (Signed)
 Name of Medication: Gabapentin  300mg   Name of [facility]acy: [facility] or Written Date and Quantity: 04/21/24 #90 3 rf  Last Office Visit and Type: 02/17/24

## 2024-03-19 NOTE — Patient Instructions (Addendum)
 Medication Instructions:   Your physician recommends that you continue on your current medications as directed. Please refer to the Current Medication list given to you today.   *If you need a refill on your cardiac medications before your next appointment, please call your pharmacy*  Lab Work:  None ordered.  If you have labs (blood work) drawn today and your tests are completely normal, you will receive your results only by: MyChart Message (if you have MyChart) OR A paper copy in the mail If you have any lab test that is abnormal or we need to change your treatment, we will call you to review the results.  Testing/Procedures:  None ordered.  Follow-Up: At [facility], you and your health needs are our priority.  As part of our continuing mission to provide you with exceptional heart care, our providers are all part of one team.  This team includes your primary Cardiologist (physician) and Advanced Practice Providers or APPs (Physician Assistants and Nurse Practitioners) who all work together to provide you with the care you need, when you need it.  Your next appointment:   6 month(s)  Provider:   Annabella Scarce, MD, Rosaline Bane, NP, or Reche Finder, NP    We recommend signing up for the patient portal called MyChart.  Sign up information is provided on this After Visit Summary.  MyChart is used to connect with patients for Virtual Visits (Telemedicine).  Patients are able to view lab/test results, encounter notes, upcoming appointments, etc.  Non-urgent messages can be sent to your provider as well.   To learn more about what you can do with MyChart, go to [email or web address].   Other Instructions  Your physician wants you to follow-up in: 6 months. You will receive a reminder letter in the mail two months in advance. If you don't receive a letter, please call our office to schedule the follow-up appointment.  Rosaline Bane PIETY will send your office  notes for upcoming procedure.

## 2024-03-19 NOTE — Telephone Encounter (Signed)
 Pt called back and scheduler Chloe scheduled pt to see Rosaline Bane, NP today at 2:45 @ [facility] location.

## 2024-03-20 ENCOUNTER — Other Ambulatory Visit: Payer: Self-pay

## 2024-03-20 ENCOUNTER — Encounter (HOSPITAL_COMMUNITY): Payer: Self-pay

## 2024-03-20 ENCOUNTER — Telehealth (HOSPITAL_BASED_OUTPATIENT_CLINIC_OR_DEPARTMENT_OTHER): Payer: Self-pay | Admitting: Nurse Practitioner

## 2024-03-20 ENCOUNTER — Encounter (HOSPITAL_COMMUNITY)
Admission: RE | Admit: 2024-03-20 | Discharge: 2024-03-20 | Disposition: A | Discharge status: Home / Self Care | Source: Ambulatory Visit | Attending: Gynecologic Oncology | Admitting: Gynecologic Oncology

## 2024-03-20 ENCOUNTER — Telehealth: Payer: Self-pay | Admitting: *Deleted

## 2024-03-20 VITALS — BP 153/70 | HR 77 | Temp 98.4°F | Resp 20 | Ht 60.0 in | Wt 181.0 lb

## 2024-03-20 DIAGNOSIS — I350 Nonrheumatic aortic (valve) stenosis: Secondary | ICD-10-CM | POA: Insufficient documentation

## 2024-03-20 DIAGNOSIS — I4891 Unspecified atrial fibrillation: Secondary | ICD-10-CM | POA: Insufficient documentation

## 2024-03-20 DIAGNOSIS — G4730 Sleep apnea, unspecified: Secondary | ICD-10-CM | POA: Diagnosis not present

## 2024-03-20 DIAGNOSIS — Z01818 Encounter for other preprocedural examination: Secondary | ICD-10-CM | POA: Diagnosis not present

## 2024-03-20 DIAGNOSIS — I129 Hypertensive chronic kidney disease with stage 1 through stage 4 chronic kidney disease, or unspecified chronic kidney disease: Secondary | ICD-10-CM | POA: Insufficient documentation

## 2024-03-20 DIAGNOSIS — E039 Hypothyroidism, unspecified: Secondary | ICD-10-CM | POA: Insufficient documentation

## 2024-03-20 DIAGNOSIS — Z7951 Long term (current) use of inhaled steroids: Secondary | ICD-10-CM | POA: Diagnosis not present

## 2024-03-20 DIAGNOSIS — C541 Malignant neoplasm of endometrium: Secondary | ICD-10-CM | POA: Diagnosis not present

## 2024-03-20 DIAGNOSIS — N183 Chronic kidney disease, stage 3 unspecified: Secondary | ICD-10-CM | POA: Insufficient documentation

## 2024-03-20 DIAGNOSIS — I2510 Atherosclerotic heart disease of native coronary artery without angina pectoris: Secondary | ICD-10-CM

## 2024-03-20 HISTORY — DX: Sleep apnea, unspecified: G47.30

## 2024-03-20 HISTORY — DX: Malignant neoplasm of endometrium: C54.1

## 2024-03-20 HISTORY — DX: Unspecified atrial fibrillation: I48.91

## 2024-03-20 HISTORY — DX: Dyspnea, unspecified: R06.00

## 2024-03-20 LAB — CBC
HCT: 46.5 % — ABNORMAL HIGH (ref 36.0–46.0)
Hemoglobin: 15.3 g/dL — ABNORMAL HIGH (ref 12.0–15.0)
MCH: 32.3 pg (ref 26.0–34.0)
MCHC: 32.9 g/dL (ref 30.0–36.0)
MCV: 98.1 fL (ref 80.0–100.0)
Platelets: 253 K/uL (ref 150–400)
RBC: 4.74 MIL/uL (ref 3.87–5.11)
RDW: 14.5 % (ref 11.5–15.5)
WBC: 9.1 K/uL (ref 4.0–10.5)
nRBC: 0 % (ref 0.0–0.2)

## 2024-03-20 LAB — BASIC METABOLIC PANEL WITH GFR
Anion gap: 12 (ref 5–15)
BUN: 31 mg/dL — ABNORMAL HIGH (ref 8–23)
CO2: 24 mmol/L (ref 22–32)
Calcium: 9.8 mg/dL (ref 8.9–10.3)
Chloride: 96 mmol/L — ABNORMAL LOW (ref 98–111)
Creatinine, Ser: 1.34 mg/dL — ABNORMAL HIGH (ref 0.44–1.00)
GFR, Estimated: 37 mL/min — ABNORMAL LOW (ref >60)
Glucose, Bld: 105 mg/dL — ABNORMAL HIGH (ref 70–99)
Potassium: 4.6 mmol/L (ref 3.5–5.1)
Sodium: 131 mmol/L — ABNORMAL LOW (ref 135–145)

## 2024-03-20 NOTE — Telephone Encounter (Signed)
 I saw patient in the office on 03/19/2024.  Advised that I would review her BMET that was collected today in preop.  Kidney function and electrolytes are stable, she may continue Lasix  20 mg daily.

## 2024-03-20 NOTE — Progress Notes (Signed)
 Anesthesia Chart Review   Case: [id number] Date/Time: 03/21/24 1445   Procedures:      DILATATION & CURETTAGE/HYSTEROSCOPY WITH RESECTOCOPE - LMA     MYOSURE RESECTION     INSERTION, INTRAUTERINE DEVICE - MIRENA    Anesthesia type: Choice   Diagnosis: Endometrial cancer (HCC) [C54.1]   Pre-op diagnosis: Endometrial cancer   Location: [facility] ROOM 05 / [facility]   Surgeons: Viktoria Comer SAUNDERS, MD       DISCUSSION:89 y.o. never smoker with h/o HTN, sleep apnea, hypothyroidism, a-fib, moderate aortic valve stenosis, CKD Stage III, endometrial cancer scheduled for above procedure 03/21/2024 with Dr. Comer Viktoria.   Pt last seen by cardiology 03/19/2024. Per OV note, According to the Revised Cardiac Risk Index (RCRI), her Perioperative Risk of Major Cardiac Event is (%): 0.9. Her Functional Capacity in METs is: 4.4 according to the Duke Activity Status Index (DASI). She admits to being sedentary at home. We walked around the office, approximately 800 feet which she was able to do with no problem.  She does not have any concerning cardiac symptoms with exertion. Therefore, based on ACC/AHA guidelines, the patient would be at acceptable risk for the planned procedure without further cardiovascular testing.  She is holding Eliquis  in the setting of vaginal bleeding.  She should resume Eliquis  as soon as hemodynamically stable postprocedure.  I will forward clearance to requesting provider.  VS: BP (!) 153/70   Pulse 77   Temp 36.9 C (Oral)   Resp 20   Ht 5' (1.524 m)   Wt 82.1 kg   SpO2 93%   BMI 35.35 kg/m   PROVIDERS: Rilla Baller, MD is PCP  Cardiologist - Annabella Scarce, MD Pulmonologist - Devona Skiff, MD  LABS: Labs reviewed: Acceptable for surgery. (all labs ordered are listed, but only abnormal results are displayed)  Labs Reviewed  BASIC METABOLIC PANEL WITH GFR - Abnormal; Notable for the following components:      Result Value   Sodium 131 (*)    Chloride 96 (*)     Glucose, Bld 105 (*)    BUN 31 (*)    Creatinine, Ser 1.34 (*)    GFR, Estimated 37 (*)    All other components within normal limits  CBC - Abnormal; Notable for the following components:   Hemoglobin 15.3 (*)    HCT 46.5 (*)    All other components within normal limits     IMAGES:   EKG:   CV: Echo 10/14/2023 1. Left ventricular ejection fraction, by estimation, is 70 to 75%. The  left ventricle has hyperdynamic function. The left ventricle has no  regional wall motion abnormalities. There is mild left ventricular  hypertrophy of the basal-septal segment. Left  ventricular diastolic function could not be evaluated. Elevated left  atrial pressure.   2. Right ventricular systolic function is normal. The right ventricular  size is mildly enlarged.   3. Left atrial size was moderately dilated.   4. Right atrial size was moderately dilated.   5. The mitral valve is normal in structure. Moderate mitral valve  regurgitation. No evidence of mitral stenosis. There is mild holosystolic  prolapse of posterior leaflet of the mitral valve.   6. The aortic valve is tricuspid. Aortic valve regurgitation is trivial.  Moderate aortic valve stenosis.   7. The inferior vena cava is normal in size with greater than 50%  respiratory variability, suggesting right atrial pressure of 3 mmHg.   Myocardial Perfusion 10/21/2022   The study  is normal. The study is low risk.   No ST deviation was noted.   Left ventricular function is normal. End diastolic cavity size is normal.   Prior study not available for comparison.  Past Medical History:  Diagnosis Date   A-fib [facility])    Allergy  1965   Anxiety 1965   Arrhythmia    Basal cell carcinoma, arm, left 02/2023   L mid lateral posterior arm ([place])   C. difficile diarrhea 07/11/2018   S/p multiple oral vanc treatments (course and tapers) and completed Zinplava  monoclonal Ab treatment (05/10/2019) Fecal transplant on hold during COVID pandemic    CAP (community acquired pneumonia) 12/22/2017   Carotid stenosis 10/18/2014   R 1-39%, L 40-59%, rpt 1 yr (09/2014)    CKD (chronic kidney disease) stage 3, GFR 30-59 ml/min (HCC) 08/02/2014   Closed fracture of right orbit (HCC) 02/10/2021   Clostridioides difficile infection    hx 2020   COVID-19 virus infection 10/13/2020   again 10/2022   Degenerative disc disease    LS   Depression 1965   nervous breakdonw in 1964-out of work for a year   Diverticulosis    severe by colonoscopy   Dyspnea    Endometrial cancer (HCC)    Family history of adverse reaction to anesthesia    n/v   GERD (gastroesophageal reflux disease) 1980   Glaucoma    Heart murmur 5/13   mitral regurge - on echo    History of hiatal hernia    History of shingles    HTN (hypertension)    Hyperlipidemia    Hypertension    Hypothyroidism    IBS 11/02/2006   Influenza A 04/20/2022   Intestinal bacterial overgrowth    In small colon   Left shoulder pain 11/04/2014   Lower GI bleed 12/2020   thought diverticular complicated by ABLA with syncope and orbital fracture s/p hospitalization   Maxillary fracture (HCC) 04/08/2012   Osteoarthritis 2016   [facility])   Osteoporosis    dexa 2011   Perirectal abscess 08/03/2021   CT guided aspiration of abscess grow Proteums mirabilis and Citrobacter koseri 07/2021, currently receiving IV Zosyn  planned total 6 wks.   Pseudogout 2016   shoulders ([place])   Sleep apnea    Strep pharyngitis 04/20/2022    Past Surgical History:  Procedure Laterality Date   barium enema  2012   severe diverticulosis, redundant colon   Bowel obstruction  1999   no surgery in hosp x 3 days   BUBBLE STUDY  07/27/2021   Procedure: BUBBLE STUDY;  Surgeon: Claudene Pacific, MD;  Location: [facility] ENDOSCOPY;  Service: Cardiovascular;;   CARDIOVERSION N/A 11/19/2021   Procedure: CARDIOVERSION;  Surgeon: Darliss Rogue, MD;  Location: [facility] ORS;  Service: Cardiovascular;  Laterality: N/A;    COLONOSCOPY  1. 1999  2. 11/04   1. Not finished  2. Slight hemorrhage rectosigmoid area, severe sig diverticulosis   COLONOSCOPY WITH PROPOFOL  N/A 09/10/2019   SSP with dysplasia, TA, rpt 3 yrs Caprice, Corinn Skiff, MD)   Dexa  1. [phone]   2. 9/04  3. 3/08    1. OP  2. OP, borderline, spine -2.44T  3. decreased BMD-OP   DG KNEE 1-2 VIEWS BILAT     LS x-ray with degenerative disc and facet change   ESOPHAGOGASTRODUODENOSCOPY     Negative   FLEXIBLE SIGMOIDOSCOPY N/A 01/16/2021   Procedure: FLEXIBLE SIGMOIDOSCOPY;  Surgeon: Avram Lupita BRAVO, MD;  Location: [facility] ENDOSCOPY;  Service: Endoscopy;  Laterality: N/A;  or unsedated   Hemorrhoid procedure  07/2006   JOINT REPLACEMENT     PICC LINE REMOVAL ([facility] HX)  08/28/2021   PROCTOSCOPY N/A 05/26/2021   Procedure: RIGID PROCTOSCOPY;  Surgeon: Sheldon Standing, MD;  Location: [facility];  Service: General;  Laterality: N/A;   RECTOPEXY N/A 05/26/2021   Procedure: RECTOPEXY;  Surgeon: Sheldon Standing, MD;  Location: [facility];  Service: General;  Laterality: N/A;   TEE WITHOUT CARDIOVERSION N/A 07/27/2021   Procedure: TRANSESOPHAGEAL ECHOCARDIOGRAM (TEE);  Surgeon: Claudene Pacific, MD;  Location: [facility] ENDOSCOPY;  Service: Cardiovascular;  Laterality: N/A;   TONSILLECTOMY     TOTAL SHOULDER ARTHROPLASTY Left 11/27/2015   Norleen JINNY Maltos, MD   TOTAL SHOULDER REVISION Left 03/16/2016   Procedure: TOTAL SHOULDER REVISION;  Surgeon: Norleen JINNY Maltos, MD;  Location: [facility] ORS;  Service: Orthopedics;  Laterality: Left;   US  ECHOCARDIOGRAPHY  07/2011   Normal systolic fxn with EF 55-60%.  Focal basal septal hypertrophy.  Mild diastolic dysfunction.  Mild MR.   XI ROBOTIC ASSISTED LOWER ANTERIOR RESECTION N/A 05/26/2021   Procedure: ROBOTIC LOW ANTERIOR RECTOSIGMOID RESECTION; ASSESSMENT OF TISSUE PERFUSION WITH FIREFLY;  Surgeon: Sheldon Standing, MD;  Location: [facility];  Service: General;  Laterality: N/A;    MEDICATIONS:  acidophilus (RISAQUAD) CAPS capsule   Ascorbic Acid   (VITAMIN C PO)   bimatoprost (LUMIGAN) 0.01 % SOLN   carvedilol  (COREG ) 6.25 MG tablet   cetirizine  (ZYRTEC ) 10 MG tablet   clonazePAM  (KLONOPIN ) 0.5 MG tablet   colchicine  0.6 MG tablet   Cyanocobalamin  (B-12) 5000 MCG CAPS   diclofenac  Sodium (VOLTAREN ) 1 % GEL   DULoxetine  (CYMBALTA ) 20 MG capsule   EPINEPHrine  (EPIPEN  2-PAK) 0.3 mg/0.3 mL IJ SOAJ injection   famotidine  (PEPCID ) 20 MG tablet   furosemide  (LASIX ) 20 MG tablet   gabapentin  (NEURONTIN ) 300 MG capsule   hydrALAZINE  (APRESOLINE ) 25 MG tablet   levothyroxine  (SYNTHROID ) 100 MCG tablet   Magnesium  250 MG CAPS   Magnesium  Hydroxide (DULCOLAX SOFT CHEWS PO)   mometasone  (ELOCON ) 0.1 % ointment   Multiple Vitamin (MULTIVITAMIN ADULT PO)   norethindrone  (AYGESTIN ) 5 MG tablet   OVER THE COUNTER MEDICATION   polyethylene glycol (MIRALAX  / GLYCOLAX ) 17 g packet   sodium chloride  (MURO 128) 5 % ophthalmic solution   spironolactone  (ALDACTONE ) 25 MG tablet   timolol (TIMOPTIC) 0.5 % ophthalmic solution   Vitamin D -Vitamin K (VITAMIN K2-VITAMIN D3 PO)   zinc gluconate 50 MG tablet   No current facility-administered medications for this encounter.     Harlene Hoots Ward, PA-C [facility] [phone]

## 2024-03-20 NOTE — Telephone Encounter (Signed)
 Received Cardio clearance

## 2024-03-20 NOTE — Telephone Encounter (Signed)
 Form faxed as requested and sent to scan in patient hart.

## 2024-03-20 NOTE — Telephone Encounter (Signed)
Telephone call to check on pre-operative status.  Patient compliant with pre-operative instructions.  Reinforced nothing to eat after midnight. Clear liquids until 1145. Patient to arrive at 1245.  No questions or concerns voiced.  Instructed to call for any needs. 

## 2024-03-20 NOTE — Telephone Encounter (Signed)
 Received PCP clearance.

## 2024-03-21 ENCOUNTER — Ambulatory Visit (HOSPITAL_COMMUNITY)
Admission: RE | Admit: 2024-03-21 | Discharge: 2024-03-21 | Disposition: A | Discharge status: Home / Self Care | Source: Ambulatory Visit | Attending: Gynecologic Oncology | Admitting: Gynecologic Oncology

## 2024-03-21 ENCOUNTER — Other Ambulatory Visit: Payer: Self-pay | Admitting: Allergy and Immunology

## 2024-03-21 ENCOUNTER — Encounter (HOSPITAL_COMMUNITY): Payer: Self-pay | Admitting: Medical

## 2024-03-21 ENCOUNTER — Encounter (HOSPITAL_COMMUNITY)
Admission: RE | Disposition: A | Discharge status: Home / Self Care | Payer: Self-pay | Source: Ambulatory Visit | Attending: Gynecologic Oncology

## 2024-03-21 ENCOUNTER — Ambulatory Visit (HOSPITAL_COMMUNITY)

## 2024-03-21 ENCOUNTER — Encounter (HOSPITAL_COMMUNITY): Payer: Self-pay | Admitting: Gynecologic Oncology

## 2024-03-21 DIAGNOSIS — C541 Malignant neoplasm of endometrium: Secondary | ICD-10-CM

## 2024-03-21 DIAGNOSIS — F32A Depression, unspecified: Secondary | ICD-10-CM | POA: Insufficient documentation

## 2024-03-21 DIAGNOSIS — M810 Age-related osteoporosis without current pathological fracture: Secondary | ICD-10-CM | POA: Diagnosis not present

## 2024-03-21 DIAGNOSIS — Z30430 Encounter for insertion of intrauterine contraceptive device: Secondary | ICD-10-CM | POA: Diagnosis not present

## 2024-03-21 DIAGNOSIS — I10 Essential (primary) hypertension: Secondary | ICD-10-CM | POA: Diagnosis not present

## 2024-03-21 DIAGNOSIS — I129 Hypertensive chronic kidney disease with stage 1 through stage 4 chronic kidney disease, or unspecified chronic kidney disease: Secondary | ICD-10-CM

## 2024-03-21 DIAGNOSIS — N1832 Chronic kidney disease, stage 3b: Secondary | ICD-10-CM

## 2024-03-21 DIAGNOSIS — E039 Hypothyroidism, unspecified: Secondary | ICD-10-CM | POA: Insufficient documentation

## 2024-03-21 DIAGNOSIS — F419 Anxiety disorder, unspecified: Secondary | ICD-10-CM | POA: Insufficient documentation

## 2024-03-21 DIAGNOSIS — M1990 Unspecified osteoarthritis, unspecified site: Secondary | ICD-10-CM | POA: Diagnosis not present

## 2024-03-21 DIAGNOSIS — I080 Rheumatic disorders of both mitral and aortic valves: Secondary | ICD-10-CM | POA: Insufficient documentation

## 2024-03-21 DIAGNOSIS — K219 Gastro-esophageal reflux disease without esophagitis: Secondary | ICD-10-CM | POA: Diagnosis not present

## 2024-03-21 DIAGNOSIS — J45909 Unspecified asthma, uncomplicated: Secondary | ICD-10-CM | POA: Diagnosis not present

## 2024-03-21 DIAGNOSIS — G4730 Sleep apnea, unspecified: Secondary | ICD-10-CM | POA: Diagnosis not present

## 2024-03-21 DIAGNOSIS — I4819 Other persistent atrial fibrillation: Secondary | ICD-10-CM

## 2024-03-21 DIAGNOSIS — K449 Diaphragmatic hernia without obstruction or gangrene: Secondary | ICD-10-CM | POA: Insufficient documentation

## 2024-03-21 DIAGNOSIS — I4891 Unspecified atrial fibrillation: Secondary | ICD-10-CM | POA: Insufficient documentation

## 2024-03-21 HISTORY — PX: MYOSURE RESECTION: SHX7611

## 2024-03-21 HISTORY — PX: DILATATION & CURRETTAGE/HYSTEROSCOPY WITH RESECTOCOPE: SHX5572

## 2024-03-21 HISTORY — PX: INTRAUTERINE DEVICE (IUD) INSERTION: SHX5877

## 2024-03-21 SURGERY — DILATATION & CURETTAGE/HYSTEROSCOPY WITH RESECTOCOPE
Anesthesia: General

## 2024-03-21 MED ORDER — CHLORHEXIDINE GLUCONATE 0.12 % MT SOLN
15.0000 mL | Freq: Once | OROMUCOSAL | Status: AC
  Administered 2024-03-21: 15 mL via OROMUCOSAL

## 2024-03-21 MED ORDER — 0.9 % SODIUM CHLORIDE (POUR BTL) OPTIME
TOPICAL | Status: DC | PRN
  Administered 2024-03-21: 1000 mL

## 2024-03-21 MED ORDER — SODIUM CHLORIDE 0.9 % IR SOLN
Status: DC | PRN
  Administered 2024-03-21: 3000 mL

## 2024-03-21 MED ORDER — LACTATED RINGERS IV SOLN: INTRAVENOUS | Status: DC

## 2024-03-21 MED ORDER — DROPERIDOL 2.5 MG/ML IJ SOLN: 0.6250 mg | Freq: Once | INTRAMUSCULAR | Status: DC | PRN

## 2024-03-21 MED ORDER — FENTANYL CITRATE (PF) 100 MCG/2ML IJ SOLN
INTRAMUSCULAR | Status: DC | PRN
  Administered 2024-03-21: 50 ug via INTRAVENOUS

## 2024-03-21 MED ORDER — ACETAMINOPHEN 10 MG/ML IV SOLN: 1000.0000 mg | Freq: Once | INTRAVENOUS | Status: DC | PRN

## 2024-03-21 MED ORDER — FENTANYL CITRATE (PF) 50 MCG/ML IJ SOSY: 25.0000 ug | PREFILLED_SYRINGE | INTRAMUSCULAR | Status: DC | PRN

## 2024-03-21 MED ORDER — PROPOFOL 10 MG/ML IV BOLUS
INTRAVENOUS | Status: DC | PRN
  Administered 2024-03-21: 80 mg via INTRAVENOUS

## 2024-03-21 MED ORDER — LIDOCAINE HCL (PF) 1 % IJ SOLN
INTRAMUSCULAR | Status: AC
  Filled 2024-03-21: qty 30

## 2024-03-21 MED ORDER — OXYCODONE HCL 5 MG PO TABS: 5.0000 mg | ORAL_TABLET | Freq: Once | ORAL | Status: DC | PRN

## 2024-03-21 MED ORDER — DEXAMETHASONE SOD PHOSPHATE PF 10 MG/ML IJ SOLN
4.0000 mg | INTRAMUSCULAR | Status: AC
  Administered 2024-03-21: 10 mg via INTRAVENOUS

## 2024-03-21 MED ORDER — LEVONORGESTREL 20 MCG/DAY IU IUD
1.0000 | INTRAUTERINE_SYSTEM | INTRAUTERINE | Status: AC
  Administered 2024-03-21: 1 via INTRAUTERINE
  Filled 2024-03-21: qty 1

## 2024-03-21 MED ORDER — ONDANSETRON HCL 4 MG/2ML IJ SOLN
INTRAMUSCULAR | Status: DC | PRN
  Administered 2024-03-21: 4 mg via INTRAVENOUS

## 2024-03-21 MED ORDER — ORAL CARE MOUTH RINSE: 15.0000 mL | Freq: Once | OROMUCOSAL | Status: AC

## 2024-03-21 MED ORDER — OXYCODONE HCL 5 MG/5ML PO SOLN: 5.0000 mg | Freq: Once | ORAL | Status: DC | PRN

## 2024-03-21 MED ORDER — FENTANYL CITRATE (PF) 100 MCG/2ML IJ SOLN
INTRAMUSCULAR | Status: AC
  Filled 2024-03-21: qty 2

## 2024-03-21 MED ORDER — ACETAMINOPHEN 500 MG PO TABS
500.0000 mg | ORAL_TABLET | ORAL | Status: AC
  Administered 2024-03-21: 500 mg via ORAL
  Filled 2024-03-21: qty 1

## 2024-03-21 MED ORDER — LIDOCAINE 2% (20 MG/ML) 5 ML SYRINGE
INTRAMUSCULAR | Status: DC | PRN
  Administered 2024-03-21: 60 mg via INTRAVENOUS

## 2024-03-21 MED ORDER — GLYCOPYRROLATE 0.2 MG/ML IJ SOLN
INTRAMUSCULAR | Status: DC | PRN
  Administered 2024-03-21: .2 mg via INTRAVENOUS

## 2024-03-21 MED ORDER — PHENYLEPHRINE 80 MCG/ML (10ML) SYRINGE FOR IV PUSH (FOR BLOOD PRESSURE SUPPORT)
PREFILLED_SYRINGE | INTRAVENOUS | Status: DC | PRN
  Administered 2024-03-21: 80 ug via INTRAVENOUS

## 2024-03-21 SURGICAL SUPPLY — 22 items
BAG COUNTER SPONGE SURGICOUNT (BAG) ×1 IMPLANT
DEVICE MYOSURE LITE (MISCELLANEOUS) IMPLANT
DEVICE MYOSURE REACH (MISCELLANEOUS) IMPLANT
DILATOR CANAL MILEX (MISCELLANEOUS) IMPLANT
DRAPE SURG IRRIG POUCH 19X23 (DRAPES) ×1 IMPLANT
GAUZE 4X4 16PLY ~~LOC~~+RFID DBL (SPONGE) IMPLANT
GLOVE BIO SURGEON STRL SZ 6 (GLOVE) ×2 IMPLANT
GOWN STRL REUS W/ TWL LRG LVL3 (GOWN DISPOSABLE) ×1 IMPLANT
IV NS IRRIG 3000ML ARTHROMATIC (IV SOLUTION) ×1 IMPLANT
KIT PROCED FLUENT PRO FLT212S (KITS) IMPLANT
KIT TURNOVER KIT A (KITS) ×1 IMPLANT
LEGGING LITHOTOMY PAIR STRL (DRAPES) IMPLANT
LOOP CUTTING BIPOLAR 21FR (ELECTRODE) IMPLANT
Mirena IUD IMPLANT
PACK VAGINAL WOMENS (CUSTOM PROCEDURE TRAY) ×1 IMPLANT
PAD OB MATERNITY 11 LF (PERSONAL CARE ITEMS) IMPLANT
SEAL ROD LENS SCOPE MYOSURE (ABLATOR) IMPLANT
SYR BULB IRRIG 60ML STRL (SYRINGE) ×1 IMPLANT
SYSTEM TISS REMOVAL MYOSURE XL (MISCELLANEOUS) IMPLANT
TOWEL OR DSP ST BLU DLX 10/PK (DISPOSABLE) ×1 IMPLANT
UNDERPAD 30X36 HEAVY ABSORB (UNDERPADS AND DIAPERS) ×1 IMPLANT
WATER STERILE IRR 500ML POUR (IV SOLUTION) ×1 IMPLANT

## 2024-03-21 NOTE — Interval H&P Note (Signed)
 History and Physical Interval Note:  03/21/2024 11:51 AM  Brooke Beasley  has presented today for surgery, with the diagnosis of Endometrial cancer.  The various methods of treatment have been discussed with the patient and family. After consideration of risks, benefits and other options for treatment, the patient has consented to  Procedure(s) with comments: DILATATION & CURETTAGE/HYSTEROSCOPY WITH RESECTOCOPE (N/A) - LMA MYOSURE RESECTION (N/A) INSERTION, INTRAUTERINE DEVICE (N/A) - MIRENA  as a surgical intervention.  The patient's history has been reviewed, patient examined, no change in status, stable for surgery.  I have reviewed the patient's chart and labs.  Questions were answered to the patient's satisfaction.     Comer JONELLE [name]

## 2024-03-21 NOTE — Anesthesia Preprocedure Evaluation (Signed)
 Anesthesia Evaluation  Patient identified by MRN, date of birth, ID band Patient awake    Reviewed: Allergy  & Precautions, H&P , NPO status , Patient's Chart, lab work & pertinent test results  History of Anesthesia Complications Negative for: history of anesthetic complications  Airway Mallampati: II  TM Distance: >3 FB Neck ROM: Full    Dental no notable dental hx.    Pulmonary asthma , sleep apnea    Pulmonary exam normal breath sounds clear to auscultation       Cardiovascular hypertension, (-) angina (-) Past MI Normal cardiovascular exam+ dysrhythmias Atrial Fibrillation  Rhythm:Regular Rate:Normal  IMPRESSIONS     1. Left ventricular ejection fraction, by estimation, is 70 to 75%. The  left ventricle has hyperdynamic function. The left ventricle has no  regional wall motion abnormalities. There is mild left ventricular  hypertrophy of the basal-septal segment. Left  ventricular diastolic function could not be evaluated. Elevated left  atrial pressure.   2. Right ventricular systolic function is normal. The right ventricular  size is mildly enlarged.   3. Left atrial size was moderately dilated.   4. Right atrial size was moderately dilated.   5. The mitral valve is normal in structure. Moderate mitral valve  regurgitation. No evidence of mitral stenosis. There is mild holosystolic  prolapse of posterior leaflet of the mitral valve.   6. The aortic valve is tricuspid. Aortic valve regurgitation is trivial.  Moderate aortic valve stenosis.   7. The inferior vena cava is normal in size with greater than 50%  respiratory variability, suggesting right atrial pressure of 3 mmHg.     Neuro/Psych neg Seizures PSYCHIATRIC DISORDERS Anxiety Depression    negative neurological ROS     GI/Hepatic Neg liver ROS, hiatal hernia,GERD  ,,  Endo/Other  Hypothyroidism    Renal/GU CRFRenal disease  negative genitourinary    Musculoskeletal  (+) Arthritis ,    Abdominal   Peds negative pediatric ROS (+)  Hematology negative hematology ROS (+)   Anesthesia Other Findings   Reproductive/Obstetrics negative OB ROS                              Anesthesia Physical Anesthesia Plan  ASA: 3  Anesthesia Plan: General   Post-op Pain Management:    Induction: Intravenous  PONV Risk Score and Plan: 3 and Ondansetron  and Dexamethasone   Airway Management Planned: LMA  Additional Equipment: None  Intra-op Plan:   Post-operative Plan: Extubation in OR  Informed Consent: I have reviewed the patients History and Physical, chart, labs and discussed the procedure including the risks, benefits and alternatives for the proposed anesthesia with the patient or authorized representative who has indicated his/her understanding and acceptance.     Dental advisory given  Plan Discussed with: CRNA  Anesthesia Plan Comments:          Anesthesia Quick Evaluation

## 2024-03-21 NOTE — Anesthesia Postprocedure Evaluation (Signed)
 Anesthesia Post Note  Patient: Brooke Beasley  Procedure(s) Performed: DILATATION & CURETTAGE/HYSTEROSCOPY MYOSURE RESECTION INSERTION, INTRAUTERINE DEVICE     Patient location during evaluation: PACU Anesthesia Type: General Level of consciousness: awake and alert Pain management: pain level controlled Vital Signs Assessment: post-procedure vital signs reviewed and stable Respiratory status: spontaneous breathing, nonlabored ventilation, respiratory function stable and patient connected to nasal cannula oxygen  Cardiovascular status: blood pressure returned to baseline and stable Postop Assessment: no apparent nausea or vomiting Anesthetic complications: no   No notable events documented.  Last Vitals:  Vitals:   03/21/24 1508 03/21/24 1515  BP: (!) 152/85 (!) 138/95  Pulse: 72 73  Resp: 18 19  Temp: 36.6 C   SpO2: 100% 100%    Last Pain:  Vitals:   03/21/24 1322  TempSrc:   PainSc: 0-No pain                 Thom JONELLE Peoples

## 2024-03-21 NOTE — Transfer of Care (Signed)
 Immediate Anesthesia Transfer of Care Note  Patient: Brooke Beasley  Procedure(s) Performed: DILATATION & CURETTAGE/HYSTEROSCOPY MYOSURE RESECTION INSERTION, INTRAUTERINE DEVICE  Patient Location: PACU  Anesthesia Type:General  Level of Consciousness: sedated, patient cooperative, and responds to stimulation  Airway & Oxygen  Therapy: Patient Spontanous Breathing and Patient connected to face mask oxygen   Post-op Assessment: Report given to RN and Post -op Vital signs reviewed and stable  Post vital signs: Reviewed and stable  Last Vitals:  Vitals Value Taken Time  BP 152/85 03/21/24 15:08  Temp    Pulse 72 03/21/24 15:12  Resp 11 03/21/24 15:12  SpO2 100 % 03/21/24 15:12  Vitals shown include unfiled device data.  Last Pain:  Vitals:   03/21/24 1322  TempSrc:   PainSc: 0-No pain         Complications: No notable events documented.

## 2024-03-21 NOTE — Discharge Instructions (Addendum)
 AFTER SURGERY INSTRUCTIONS   Return to work:  1-2 days if applicable   Activity: 1. Be up and out of the bed during the day.  Take a nap if needed.  You may walk up steps but be careful and use the hand rail.  Stair climbing will tire you more than you think, you may need to stop part way and rest.    2. No lifting or straining for 2 weeks over 10 pounds. No pushing, pulling, straining for 2 weeks.   3. No driving for minimum 24 hours after surgery.  Do not drive if you are taking narcotic pain medicine and make sure that your reaction time has returned.    4. You can shower as soon as the next day after surgery. Shower daily. No tub baths or submerging your body in water until cleared by your surgeon (2 weeks minimum). If you have the soap that was given to you by pre-surgical testing that was used before surgery, you do not need to use it afterwards because this can irritate your incisions.    5. No sexual activity and nothing in the vagina for 2 weeks.   6. You may experience vaginal spotting and discharge after surgery.  The spotting is normal but if you experience heavy bleeding, call our office.   7. Take Tylenol and ibuprofen for pain if you are able to take these medications for discomfort as needed.     Diet: 1. Low sodium Heart Healthy Diet is recommended but you are cleared to resume your normal (before surgery) diet after your procedure.   2. It is safe to use a laxative, such as Miralax or Colace, if you have difficulty moving your bowels.    Wound Care: 1. Keep clean and dry.  Shower daily.   Reasons to call the Doctor: Fever - Oral temperature greater than 100.4 degrees Fahrenheit Foul-smelling vaginal discharge Difficulty urinating Nausea and vomiting Increased pain at the site of the incision that is unrelieved with pain medicine. Difficulty breathing with or without chest pain New calf pain especially if only on one side Sudden, continuing increased vaginal  bleeding with or without clots.   Contacts: For questions or concerns you should contact:   Dr. Wiley Hanger at [phone]   Vira Grieves, NP at [phone]   After Hours: call [phone] and have the GYN Oncologist paged/contacted (after 5 pm or on the weekends).   Messages sent via mychart are for non-urgent matters and are not responded to after hours so for urgent needs, please call the after hours number.

## 2024-03-21 NOTE — Anesthesia Procedure Notes (Signed)
 Procedure Name: LMA Insertion Date/Time: 03/21/2024 2:08 PM  Performed by: Cindie Charleen PARAS, CRNAPre-anesthesia Checklist: Patient identified, Emergency Drugs available, Suction available, Patient being monitored and Timeout performed Patient Re-evaluated:Patient Re-evaluated prior to induction Oxygen  Delivery Method: Circle system utilized Preoxygenation: Pre-oxygenation with 100% oxygen  Induction Type: IV induction Ventilation: Mask ventilation without difficulty LMA: LMA inserted LMA Size: 4.0 Number of attempts: 1 Airway Equipment and Method: Stylet Placement Confirmation: positive ETCO2 and breath sounds checked- equal and bilateral Tube secured with: Tape Dental Injury: Teeth and Oropharynx as per pre-operative assessment

## 2024-03-21 NOTE — Op Note (Signed)
 OPERATIVE NOTE  PATIENT: Brooke Beasley DATE: 03/21/24  Preop Diagnosis: Endometrial cancer  Postoperative Diagnosis: same as above  Surgery: D&C (dilation and curettage), hysteroscopy using the Myosure  Surgeons:  Viktoria Crank, MD  Assistant: none  Anesthesia: General   Estimated blood loss: 5 ml  IVF:  see I&O flowsheet   Urine output: n/a   Fluid deficit: 115 ml  Complications: None apparent  Pathology: endometrial curetteings  Operative findings: On EUA, small mobile uterus. Uterus sounded to just under 8 cm. Cervix already dilated to easily allow passage of a 91F dilator. On hysteroscopy, fluffy tissue along anterior aspect of the lower uterine specimen. Rest of the endometrium was very atrophic in appearance.  Mirena  IUD Lot [id number], exp 01/2026.  Procedure: The patient was identified in the preoperative holding area. Informed consent was signed on the chart. Patient was seen history was reviewed and exam was performed.   The patient was then taken to the operating room and placed in the supine position with SCD hose on. General anesthesia was then induced without difficulty. She was then placed in the dorsolithotomy position. The perineum was prepped with CHG. The vagina was prepped with CHG. The patient was then draped after the prep was dried.   Timeout was performed the patient, procedure, antibiotic, allergy , and length of procedure.   The speculum was placed in the vagina. The single tooth tenaculum was placed on the anterior lip of the cervix. 10 cc of 1% lidocaine  was injected for local anesthesia as a paracervical block. The uterine sound was placed in the cervix and advanced to the fundus. The cervix was successively dilated using pratts dilators to 91F.  The hysteroscope was inserted into the uterine cavity with findings as above. Sampling of all uterine walls was performed under direct visualization. The specimen was collected specimen sock  to be sent for permanent pathology.  Mirena  IUD was then inserted to the fundus, deployed, and the inserter was removed. Strings were cut at 4 cm.   The tenaculum was removed and hemostasis was observed.   The vagina was irrigated.  All instrument, suture, laparotomy, Ray-Tec, and needle counts were correct x2. The patient tolerated the procedure well and was taken recovery room in stable condition.   Crank Viktoria MD Gynecologic Oncology

## 2024-03-23 ENCOUNTER — Telehealth: Payer: Self-pay

## 2024-03-23 ENCOUNTER — Ambulatory Visit: Payer: Self-pay | Admitting: Gynecologic Oncology

## 2024-03-23 ENCOUNTER — Encounter (HOSPITAL_COMMUNITY): Payer: Self-pay | Admitting: Gynecologic Oncology

## 2024-03-23 LAB — SURGICAL PATHOLOGY

## 2024-03-23 NOTE — Telephone Encounter (Signed)
 Spoke with Brooke Beasley, Brooke Beasley daughter, this morning. She states she is eating, drinking and urinating well. She has had a BM  but is passing gas. She is taking senokot as prescribed and encouraged her to drink plenty of water. She denies fever or chills. She rates her pain 0/10.     Instructed to call office with any fever, chills, purulent drainage, uncontrolled pain or any other questions or concerns. Patient verbalizes understanding.   Pt aware of post op appointments as well as the office number [phone] and after hours number [phone] to call if she has any questions or concerns  Pt's daughter is inquiring if Brooke Beasley needs to continue taking the Norethindrone  5mg  2 tablets daily? Brooke Beasley reports getting flushed in the face since she started it on 11/11.

## 2024-03-23 NOTE — Telephone Encounter (Signed)
 I spoke to Brooke Beasley and her daughter, [name]. They are both aware ok to stop the Norethindrone , with restarting it if bleeding picks up. They both voiced an understanding

## 2024-03-28 ENCOUNTER — Inpatient Hospital Stay: Attending: Gynecologic Oncology | Admitting: Gynecologic Oncology

## 2024-03-28 ENCOUNTER — Encounter: Payer: Self-pay | Admitting: Gynecologic Oncology

## 2024-03-28 DIAGNOSIS — Z7189 Other specified counseling: Secondary | ICD-10-CM

## 2024-03-28 DIAGNOSIS — C541 Malignant neoplasm of endometrium: Secondary | ICD-10-CM | POA: Insufficient documentation

## 2024-03-28 NOTE — Progress Notes (Signed)
 Gynecologic Oncology Telehealth Note: Gyn-Onc  I connected with Brooke Beasley on 03/28/24 at  6:00 PM EST by telephone and verified that I am speaking with the correct person using two identifiers.  I discussed the limitations, risks, security and privacy concerns of performing an evaluation and management service by telemedicine and the availability of in-person appointments. I also discussed with the patient that there may be a patient responsible charge related to this service. The patient expressed understanding and agreed to proceed.  Other persons participating in the visit and their role in the encounter: none.  Patient's location: home, [place] Provider's location: [facility], [place]  Reason for Visit: follow-up  Treatment History: The patient initially presented with PMB for 1 month after starting Black Cohosh and Primrose in the setting of taking Eliquis  for atrial fibrillation. Pelvic ultrasound showed endometrium thickened at 15 mm. She underwent EMB on 11/11. Pathology revealed FIGO grade 1 endometrioid endometrial adenocarcinoma.    Her surgical history is notable for robotic LAR and rectopexy in 2023 for rectal prolapse.     Patient comes in with her daughter today.  She reports starting primrose for itching.  She had an episode of vaginal bleeding and saw her primary care provider that same day.  She was ultimately referred to OB/GYN.  She notes that bleeding is not daily.  She uses a pure wick at night and sometimes there will be a faint stain on the PureWick or the urine will look a little bit darker.  Occasionally she has seen some blood in her underwear.  She stops taking Eliquis  when she has any bleeding.  Not currently taking her Eliquis .   She was started on norethindrone  after her biopsy.  Has noticed some breast tenderness.  Is unsure if there has been any change to her bleeding.  Has some intermittent abdominal pain in different locations which she thinks is related to  diverticulosis.  Has some constipation at baseline for which she uses laxative as needed.  03/21/24: D&C (dilation and curettage), hysteroscopy using the Myosure   Interval History: Doing well. Minimal bleeding since surgery. Started oral progesterone again when she had spotting Sunday.   Past Medical/Surgical History: Past Medical History:  Diagnosis Date   A-fib [facility])    Allergy  1965   Anxiety 1965   Arrhythmia    Basal cell carcinoma, arm, left 02/2023   L mid lateral posterior arm ([place])   C. difficile diarrhea 07/11/2018   S/p multiple oral vanc treatments (course and tapers) and completed Zinplava  monoclonal Ab treatment (05/10/2019) Fecal transplant on hold during COVID pandemic   CAP (community acquired pneumonia) 12/22/2017   Carotid stenosis 10/18/2014   R 1-39%, L 40-59%, rpt 1 yr (09/2014)    CKD (chronic kidney disease) stage 3, GFR 30-59 ml/min (HCC) 08/02/2014   Closed fracture of right orbit (HCC) 02/10/2021   Clostridioides difficile infection    hx 2020   COVID-19 virus infection 10/13/2020   again 10/2022   Degenerative disc disease    LS   Depression 1965   nervous breakdonw in 1964-out of work for a year   Diverticulosis    severe by colonoscopy   Dyspnea    Endometrial cancer (HCC)    Family history of adverse reaction to anesthesia    n/v   GERD (gastroesophageal reflux disease) 1980   Glaucoma    Heart murmur 5/13   mitral regurge - on echo    History of hiatal hernia    History of  shingles    HTN (hypertension)    Hyperlipidemia    Hypertension    Hypothyroidism    IBS 11/02/2006   Influenza A 04/20/2022   Intestinal bacterial overgrowth    In small colon   Left shoulder pain 11/04/2014   Lower GI bleed 12/2020   thought diverticular complicated by ABLA with syncope and orbital fracture s/p hospitalization   Maxillary fracture (HCC) 04/08/2012   Osteoarthritis 2016   [place])   Osteoporosis    dexa 2011   Perirectal abscess  08/03/2021   CT guided aspiration of abscess grow Proteums mirabilis and Citrobacter koseri 07/2021, currently receiving IV Zosyn  planned total 6 wks.   Pseudogout 2016   shoulders ([place])   Sleep apnea    Strep pharyngitis 04/20/2022    Past Surgical History:  Procedure Laterality Date   barium enema  2012   severe diverticulosis, redundant colon   Bowel obstruction  1999   no surgery in hosp x 3 days   BUBBLE STUDY  07/27/2021   Procedure: BUBBLE STUDY;  Surgeon: Claudene Pacific, MD;  Location: [facility] ENDOSCOPY;  Service: Cardiovascular;;   CARDIOVERSION N/A 11/19/2021   Procedure: CARDIOVERSION;  Surgeon: Darliss Rogue, MD;  Location: [facility] ORS;  Service: Cardiovascular;  Laterality: N/A;   COLONOSCOPY  1. 1999  2. 11/04   1. Not finished  2. Slight hemorrhage rectosigmoid area, severe sig diverticulosis   COLONOSCOPY WITH PROPOFOL  N/A 09/10/2019   SSP with dysplasia, TA, rpt 3 yrs Caprice, Corinn Skiff, MD)   Dexa  1. [phone]   2. 9/04  3. 3/08    1. OP  2. OP, borderline, spine -2.44T  3. decreased BMD-OP   DG KNEE 1-2 VIEWS BILAT     LS x-ray with degenerative disc and facet change   DILATATION & CURRETTAGE/HYSTEROSCOPY WITH RESECTOCOPE N/A 03/21/2024   Procedure: DILATATION & CURETTAGE/HYSTEROSCOPY;  Surgeon: Viktoria Comer SAUNDERS, MD;  Location: [facility] ORS;  Service: Gynecology;  Laterality: N/A;  LMA   ESOPHAGOGASTRODUODENOSCOPY     Negative   FLEXIBLE SIGMOIDOSCOPY N/A 01/16/2021   Procedure: FLEXIBLE SIGMOIDOSCOPY;  Surgeon: Avram Lupita BRAVO, MD;  Location: [facility] ENDOSCOPY;  Service: Endoscopy;  Laterality: N/A;  or unsedated   Hemorrhoid procedure  07/2006   INTRAUTERINE DEVICE (IUD) INSERTION N/A 03/21/2024   Procedure: INSERTION, INTRAUTERINE DEVICE;  Surgeon: Viktoria Comer SAUNDERS, MD;  Location: [facility] ORS;  Service: Gynecology;  Laterality: N/A;  MIRENA    JOINT REPLACEMENT     MYOSURE RESECTION N/A 03/21/2024   Procedure: MELINDA RESECTION;  Surgeon: Viktoria Comer SAUNDERS, MD;   Location: [facility] ORS;  Service: Gynecology;  Laterality: N/A;   PICC LINE REMOVAL ([facility] HX)  08/28/2021   PROCTOSCOPY N/A 05/26/2021   Procedure: RIGID PROCTOSCOPY;  Surgeon: Sheldon Standing, MD;  Location: [facility] ORS;  Service: General;  Laterality: N/A;   RECTOPEXY N/A 05/26/2021   Procedure: RECTOPEXY;  Surgeon: Sheldon Standing, MD;  Location: [facility] ORS;  Service: General;  Laterality: N/A;   TEE WITHOUT CARDIOVERSION N/A 07/27/2021   Procedure: TRANSESOPHAGEAL ECHOCARDIOGRAM (TEE);  Surgeon: Claudene Pacific, MD;  Location: [facility] ENDOSCOPY;  Service: Cardiovascular;  Laterality: N/A;   TONSILLECTOMY     TOTAL SHOULDER ARTHROPLASTY Left 11/27/2015   Norleen JINNY Maltos, MD   TOTAL SHOULDER REVISION Left 03/16/2016   Procedure: TOTAL SHOULDER REVISION;  Surgeon: Norleen JINNY Maltos, MD;  Location: [facility] ORS;  Service: Orthopedics;  Laterality: Left;   US  ECHOCARDIOGRAPHY  07/2011   Normal systolic fxn with EF 55-60%.  Focal basal septal  hypertrophy.  Mild diastolic dysfunction.  Mild MR.   XI ROBOTIC ASSISTED LOWER ANTERIOR RESECTION N/A 05/26/2021   Procedure: ROBOTIC LOW ANTERIOR RECTOSIGMOID RESECTION; ASSESSMENT OF TISSUE PERFUSION WITH FIREFLY;  Surgeon: Sheldon Standing, MD;  Location: [facility] ORS;  Service: General;  Laterality: N/A;    Family History  Problem Relation Age of Onset   Diabetes Brother        post-op   Coronary artery disease Brother    Uterine cancer Paternal Grandmother    Diabetes Paternal Grandfather    Breast cancer Cousin    Colon cancer Neg Hx    Ovarian cancer Neg Hx    Pancreatic cancer Neg Hx    Prostate cancer Neg Hx     Social History   Socioeconomic History   Marital status: Widowed    Spouse name: Not on file   Number of children: 2   Years of education: Not on file   Highest education level: 12th grade  Occupational History   Occupation: Retired    Associate Professor: RETIRED  Tobacco Use   Smoking status: Never   Smokeless tobacco: Never  Vaping Use   Vaping status: Never Used   Substance and Sexual Activity   Alcohol use: No   Drug use: No   Sexual activity: Not Currently  Other Topics Concern   Not on file  Social History Narrative   Left handed   Widow. Husband ([name]) deceased 03/03/2016 from dementia. She was caregiver.    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   Activity: no regular exercise   Diet: good water, fruits/vegetables daily      Patient Care Team:   Rilla Baller, MD as PCP - General (Family Medicine)   Darliss Rogue, MD as PCP - Cardiology (Cardiology)   Landy Arvin BRAVO, RN as [facility] Care Management   Gross, Standing, MD as Consulting Physician (General Surgery)   Unk Corinn Skiff, MD as Consulting Physician (Gastroenterology)   Darliss Rogue, MD as Consulting Physician (Cardiology)   Social Drivers of Health   Financial Resource Strain: Medium Risk (02/06/2024)   Overall Financial Resource Strain (CARDIA)    Difficulty of Paying Living Expenses: Somewhat hard  Food Insecurity: No Food Insecurity (03/16/2024)   Hunger Vital Sign    Worried About Running Out of Food in the Last Year: Never true    Ran Out of Food in the Last Year: Never true  Transportation Needs: No Transportation Needs (03/16/2024)   PRAPARE - Transportation    Lack of Transportation (Medical): No    Lack of Transportation (Non-Medical): No  Physical Activity: Inactive (02/06/2024)   Exercise Vital Sign    Days of Exercise per Week: 0 days    Minutes of Exercise per Session: Not on file  Stress: No Stress Concern Present (02/06/2024)   [employer] of Occupational Health - Occupational Stress Questionnaire    Feeling of Stress: Only a little  Social Connections: Moderately Isolated (02/06/2024)   Social Connection and Isolation Panel    Frequency of Communication with Friends and Family: More than three times a week    Frequency of Social Gatherings with  Friends and Family: Once a week    Attends Religious Services: 1 to 4 times per year    Active Member of [employer] or Organizations: No    Attends Banker Meetings: Not on file    Marital Status: Widowed  Current Medications:  Current Outpatient Medications:    acidophilus (RISAQUAD) CAPS capsule, Take 1 capsule by mouth daily., Disp: , Rfl:    apixaban  (ELIQUIS ) 5 MG TABS tablet, Take 5 mg by mouth 2 (two) times daily., Disp: , Rfl:    Ascorbic Acid  (VITAMIN C PO), Take 1,000 mg by mouth daily., Disp: , Rfl:    bimatoprost (LUMIGAN) 0.01 % SOLN, Place 1 drop into both eyes at bedtime., Disp: , Rfl:    carvedilol  (COREG ) 6.25 MG tablet, Take 1 tablet (6.25 mg total) by mouth 2 (two) times daily with a meal., Disp: 180 tablet, Rfl: 1   cetirizine  (ZYRTEC ) 10 MG tablet, Take 1 tablet by mouth once daily (Patient taking differently: Take 10 mg by mouth daily as needed.), Disp: 90 tablet, Rfl: 1   clonazePAM  (KLONOPIN ) 0.5 MG tablet, TAKE 1 TABLET BY MOUTH AT BEDTIME, Disp: 30 tablet, Rfl: 0   colchicine  0.6 MG tablet, Take 1 tablet (0.6 mg total) by mouth daily., Disp: 90 tablet, Rfl: 1   Cyanocobalamin  (B-12) 5000 MCG CAPS, Take 5,000 mcg by mouth daily., Disp: , Rfl:    diclofenac  Sodium (VOLTAREN ) 1 % GEL, Apply 2 g topically daily as needed (pain)., Disp: , Rfl:    DULoxetine  (CYMBALTA ) 20 MG capsule, Take 1 capsule by mouth once daily, Disp: 90 capsule, Rfl: 4   EPINEPHrine  (EPIPEN  2-PAK) 0.3 mg/0.3 mL IJ SOAJ injection, Inject 0.3 mg into the muscle as needed for anaphylaxis. XOLAIR  INJECTION PROTOCOL, Disp: 2 each, Rfl: 2   famotidine  (PEPCID ) 20 MG tablet, Take 1 tablet (20 mg total) by mouth 2 (two) times daily., Disp: 180 tablet, Rfl: 3   furosemide  (LASIX ) 20 MG tablet, Take 1 tablet (20 mg total) by mouth daily. (Patient taking differently: Take 20 mg by mouth daily. Has been taking an additional 20 mg daily), Disp: 90 tablet, Rfl: 4   gabapentin  (NEURONTIN ) 300 MG  capsule, TAKE 1 CAPSULE BY MOUTH ONCE DAILY AS NEEDED FOR  NERVE  PAIN  (ITCHING), Disp: 90 capsule, Rfl: 3   hydrALAZINE  (APRESOLINE ) 25 MG tablet, Take 1 tablet (25 mg total) by mouth in the morning and at bedtime., Disp: 180 tablet, Rfl: 3   levothyroxine  (SYNTHROID ) 100 MCG tablet, Take 1 tablet (100 mcg total) by mouth daily before breakfast., Disp: 90 tablet, Rfl: 3   Magnesium  250 MG CAPS, Take 1 capsule by mouth at bedtime., Disp: , Rfl:    Magnesium  Hydroxide (DULCOLAX SOFT CHEWS PO), Take 2 each by mouth daily as needed (constipation)., Disp: , Rfl:    mometasone  (ELOCON ) 0.1 % ointment, Apply pea sized amount to affected area daily as needed. Shower then ointment applied while still wet, Disp: 45 g, Rfl: 1   Multiple Vitamin (MULTIVITAMIN ADULT PO), Take 1 tablet by mouth daily., Disp: , Rfl:    norethindrone  (AYGESTIN ) 5 MG tablet, Take 2 tablets (10 mg total) by mouth daily., Disp: 60 tablet, Rfl: 2   OVER THE COUNTER MEDICATION, Take 1 tablet by mouth daily. [place] High Absorption, Disp: , Rfl:    polyethylene glycol (MIRALAX  / GLYCOLAX ) 17 g packet, Take 17 g by mouth every 3 (three) days., Disp: , Rfl:    sodium chloride  (MURO 128) 5 % ophthalmic solution, Place 1 drop into both eyes as needed for eye irritation., Disp: , Rfl:    spironolactone  (ALDACTONE ) 25 MG tablet, TAKE 1 AND 1/2 TABLETS DAILY, Disp: 135 tablet, Rfl: 3   timolol (TIMOPTIC) 0.5 % ophthalmic solution, Place  1 drop into both eyes every morning., Disp: , Rfl:    Vitamin D -Vitamin K (VITAMIN K2-VITAMIN D3 PO), Take 1 tablet by mouth daily., Disp: , Rfl:    zinc gluconate 50 MG tablet, Take 50 mg by mouth daily., Disp: , Rfl:   Review of Symptoms: Pertinent positives as per HPI.  Physical Exam: Deferred given limitations of phone visit.  Laboratory & Radiologic Studies: A. ENDOMETRIAL, CURETTINGS:  - Endometrioid carcinoma, FIGO 1.  - MMR and p53 testing was performed on the prior biopsy ([id number]).    Assessment & Plan: Brooke Beasley is a 89 y.o. woman with clinical Stage I grade 1 endometrioid endometrial adenocarcinoma, MMRp. Status post hysteroscopy, D&C with Mirena  IUD placement last week.  Patient is overall doing very well.  Discussed that she can continue the low-dose oral progesterone for a couple of months or stop it and restarted if she begins having spotting or bleeding.  Reviewed pathology, which confirms prior endometrial sampling.  We discussed again decision about treatment with hormonal therapy rather than pursuing major surgery given her comorbidities and history.  I think she is a reasonable candidate for surgery and if she ultimately decides that she would like to proceed with major surgery, my office can work on getting clearances from her various physicians.  Specifically, I think it would be beneficial for her to see infectious disease again as she had previously been told to avoid having surgery moving forward given prior severe infection that developed postoperatively.  In terms of follow-up, we reviewed the plan for follow-up every 3 months initially.  She is scheduled to see me in February.  We will perform an endometrial biopsy at that time.  I am unable to see p53 IHC results from either recent procedure or her original biopsy.  I will have my office reach out to ensure that this was performed by pathology.  I also called the patient's daughter and left a message without identifying details to summarize my discussion with her mother.  Left phone number for the clinic if she would like to call to speak directly or if she has any questions.  I discussed the assessment and treatment plan with the patient. The patient was provided with an opportunity to ask questions and all were answered. The patient agreed with the plan and demonstrated an understanding of the instructions.   The patient was advised to call back or see an in-person evaluation if the symptoms  worsen or if the condition fails to improve as anticipated.   10 minutes of total time was spent for this patient encounter, including preparation, phone counseling with the patient and coordination of care, and documentation of the encounter.   Comer [name], MD  Division of Gynecologic Oncology  Department of Obstetrics and Gynecology  [facility]

## 2024-03-29 LAB — SURGICAL PATHOLOGY

## 2024-04-03 ENCOUNTER — Encounter: Payer: Self-pay | Admitting: Gynecologic Oncology

## 2024-04-03 ENCOUNTER — Telehealth: Payer: Self-pay | Admitting: *Deleted

## 2024-04-03 NOTE — Telephone Encounter (Signed)
 Attempted to reach patient's daughter [name] in regards to her MyChart message about symptoms her mother is experiencing. LVM requesting call back.   Spoke with Ms. Weare in regards to [employer]. Pt states she started having a fishy vaginal odor two days ago. Pt reports having a light thin yellow discharge on her peri pads. Denies bleeding, fever and or chills. She does endorse an occasional lower pelvis dull pain and some mild nausea with a good appetite and states, I have indigestion.    Advised patient her message will be relayed to providers and the office will call back with recommendations. Pt verbalized understanding and thanked the office for calling.

## 2024-04-03 NOTE — Telephone Encounter (Signed)
 Spoke with Brooke Beasley and her daughter Brooke Beasley that [name], NP would like to see Brooke Beasley to obtain a vaginal swab. An appointment has been scheduled for this Friday, 12/12 at 1045 with an arrival time of 1030 for check in. Both Brooke Beasley and her daughter agreed to date and time of appointment and thanked the office for calling.

## 2024-04-06 ENCOUNTER — Inpatient Hospital Stay

## 2024-04-06 ENCOUNTER — Other Ambulatory Visit (HOSPITAL_COMMUNITY): Payer: Self-pay

## 2024-04-06 ENCOUNTER — Telehealth: Payer: Self-pay | Admitting: *Deleted

## 2024-04-06 ENCOUNTER — Other Ambulatory Visit (HOSPITAL_COMMUNITY)
Admission: RE | Admit: 2024-04-06 | Discharge: 2024-04-06 | Disposition: A | Source: Ambulatory Visit | Attending: Gynecologic Oncology | Admitting: Gynecologic Oncology

## 2024-04-06 ENCOUNTER — Inpatient Hospital Stay: Admitting: Gynecologic Oncology

## 2024-04-06 VITALS — BP 131/65 | HR 74 | Temp 97.5°F | Resp 18

## 2024-04-06 DIAGNOSIS — C541 Malignant neoplasm of endometrium: Secondary | ICD-10-CM

## 2024-04-06 DIAGNOSIS — N898 Other specified noninflammatory disorders of vagina: Secondary | ICD-10-CM

## 2024-04-06 DIAGNOSIS — L292 Pruritus vulvae: Secondary | ICD-10-CM

## 2024-04-06 LAB — WET PREP, GENITAL
Clue Cells Wet Prep HPF POC: NONE SEEN
Sperm: NONE SEEN
Trich, Wet Prep: NONE SEEN
WBC, Wet Prep HPF POC: <10 (ref <10)
Yeast Wet Prep HPF POC: NONE SEEN

## 2024-04-06 NOTE — Progress Notes (Signed)
 Gynecologic Oncology Symptom Management  Reason for Visit: post-op symptom management visit for vulvar itching and foul smelling discharge from vagina s/p recent D&C IUD insertion  Treatment History: The patient initially presented with PMB for 1 month after starting Black Cohosh and Primrose in the setting of taking Eliquis  for atrial fibrillation. Pelvic ultrasound showed endometrium thickened at 15 mm. She underwent EMB on 11/11. Pathology revealed FIGO grade 1 endometrioid endometrial adenocarcinoma.    Her surgical history is notable for robotic LAR and rectopexy in 2023 for rectal prolapse.    On 03/21/24, she underwent D&C (dilation and curettage), hysteroscopy using the Myosure. Final pathology returned with grade 1 endometrioid carcinoma.  Interval History: Patient presents to the office today for evaluation of vulvar itching and foul smelling vaginal discharge noted after D&C. Patient reports having widespread itching for the past 3 years. Currently itching present on back, shoulders, groin, neck, and the back of her legs. She has seen an allergist, dermatologist, infectious disease specialist with no improvement. She has been on multiple medications for this without improvement. She had previously used the steroid cream mometasone  and used it last night with no relief. She has also taken a drug holiday except for stopping her BP medication with no change in symptoms. She has had intermittent nausea with decreased appetite that was present preop.  She had 2 days of discharge with a foul odor after having the D&C with the odor/discharge resolving. Denies heavy vaginal bleeding. Started oral progesterone again when she had spotting Sunday. Spotting has resolved. Bowels and bladder at baseline. Has urinary leakage intermittently. No change in detergent or soap.  Past Medical/Surgical History: Past Medical History:  Diagnosis Date   A-fib [facility])    Allergy  1965   Anxiety 1965   Arrhythmia     Basal cell carcinoma, arm, left 02/2023   L mid lateral posterior arm ([place])   C. difficile diarrhea 07/11/2018   S/p multiple oral vanc treatments (course and tapers) and completed Zinplava  monoclonal Ab treatment (05/10/2019) Fecal transplant on hold during COVID pandemic   CAP (community acquired pneumonia) 12/22/2017   Carotid stenosis 10/18/2014   R 1-39%, L 40-59%, rpt 1 yr (09/2014)    CKD (chronic kidney disease) stage 3, GFR 30-59 ml/min (HCC) 08/02/2014   Closed fracture of right orbit (HCC) 02/10/2021   Clostridioides difficile infection    hx 2020   COVID-19 virus infection 10/13/2020   again 10/2022   Degenerative disc disease    LS   Depression 1965   nervous breakdonw in 1964-out of work for a year   Diverticulosis    severe by colonoscopy   Dyspnea    Endometrial cancer (HCC)    Family history of adverse reaction to anesthesia    n/v   GERD (gastroesophageal reflux disease) 1980   Glaucoma    Heart murmur 5/13   mitral regurge - on echo    History of hiatal hernia    History of shingles    HTN (hypertension)    Hyperlipidemia    Hypertension    Hypothyroidism    IBS 11/02/2006   Influenza A 04/20/2022   Intestinal bacterial overgrowth    In small colon   Left shoulder pain 11/04/2014   Lower GI bleed 12/2020   thought diverticular complicated by ABLA with syncope and orbital fracture s/p hospitalization   Maxillary fracture (HCC) 04/08/2012   Osteoarthritis 2016   ([facility])   Osteoporosis    dexa 2011   Perirectal abscess 08/03/2021  CT guided aspiration of abscess grow Proteums mirabilis and Citrobacter koseri 07/2021, currently receiving IV Zosyn  planned total 6 wks.   Pseudogout 2016   shoulders ([place])   Sleep apnea    Strep pharyngitis 04/20/2022    Past Surgical History:  Procedure Laterality Date   barium enema  2012   severe diverticulosis, redundant colon   Bowel obstruction  1999   no surgery in hosp x 3 days   BUBBLE STUDY   07/27/2021   Procedure: BUBBLE STUDY;  Surgeon: Claudene Pacific, MD;  Location: [facility] ENDOSCOPY;  Service: Cardiovascular;;   CARDIOVERSION N/A 11/19/2021   Procedure: CARDIOVERSION;  Surgeon: Darliss Rogue, MD;  Location: [facility] ORS;  Service: Cardiovascular;  Laterality: N/A;   COLONOSCOPY  1. 1999  2. 11/04   1. Not finished  2. Slight hemorrhage rectosigmoid area, severe sig diverticulosis   COLONOSCOPY WITH PROPOFOL  N/A 09/10/2019   SSP with dysplasia, TA, rpt 3 yrs Caprice, Corinn Skiff, MD)   Dexa  1. [phone]   2. 9/04  3. 3/08    1. OP  2. OP, borderline, spine -2.44T  3. decreased BMD-OP   DG KNEE 1-2 VIEWS BILAT     LS x-ray with degenerative disc and facet change   DILATATION & CURRETTAGE/HYSTEROSCOPY WITH RESECTOCOPE N/A 03/21/2024   Procedure: DILATATION & CURETTAGE/HYSTEROSCOPY;  Surgeon: Viktoria Comer SAUNDERS, MD;  Location: [facility] ORS;  Service: Gynecology;  Laterality: N/A;  LMA   ESOPHAGOGASTRODUODENOSCOPY     Negative   FLEXIBLE SIGMOIDOSCOPY N/A 01/16/2021   Procedure: FLEXIBLE SIGMOIDOSCOPY;  Surgeon: Avram Lupita BRAVO, MD;  Location: [facility] ENDOSCOPY;  Service: Endoscopy;  Laterality: N/A;  or unsedated   Hemorrhoid procedure  07/2006   INTRAUTERINE DEVICE (IUD) INSERTION N/A 03/21/2024   Procedure: INSERTION, INTRAUTERINE DEVICE;  Surgeon: Viktoria Comer SAUNDERS, MD;  Location: [facility] ORS;  Service: Gynecology;  Laterality: N/A;  MIRENA    JOINT REPLACEMENT     MYOSURE RESECTION N/A 03/21/2024   Procedure: MELINDA RESECTION;  Surgeon: Viktoria Comer SAUNDERS, MD;  Location: [facility] ORS;  Service: Gynecology;  Laterality: N/A;   PICC LINE REMOVAL ([facility] HX)  08/28/2021   PROCTOSCOPY N/A 05/26/2021   Procedure: RIGID PROCTOSCOPY;  Surgeon: Sheldon Standing, MD;  Location: [facility] ORS;  Service: General;  Laterality: N/A;   RECTOPEXY N/A 05/26/2021   Procedure: RECTOPEXY;  Surgeon: Sheldon Standing, MD;  Location: [facility] ORS;  Service: General;  Laterality: N/A;   TEE WITHOUT CARDIOVERSION N/A 07/27/2021    Procedure: TRANSESOPHAGEAL ECHOCARDIOGRAM (TEE);  Surgeon: Claudene Pacific, MD;  Location: [facility] ENDOSCOPY;  Service: Cardiovascular;  Laterality: N/A;   TONSILLECTOMY     TOTAL SHOULDER ARTHROPLASTY Left 11/27/2015   Norleen JINNY Maltos, MD   TOTAL SHOULDER REVISION Left 03/16/2016   Procedure: TOTAL SHOULDER REVISION;  Surgeon: Norleen JINNY Maltos, MD;  Location: [facility] ORS;  Service: Orthopedics;  Laterality: Left;   US  ECHOCARDIOGRAPHY  07/2011   Normal systolic fxn with EF 55-60%.  Focal basal septal hypertrophy.  Mild diastolic dysfunction.  Mild MR.   XI ROBOTIC ASSISTED LOWER ANTERIOR RESECTION N/A 05/26/2021   Procedure: ROBOTIC LOW ANTERIOR RECTOSIGMOID RESECTION; ASSESSMENT OF TISSUE PERFUSION WITH FIREFLY;  Surgeon: Sheldon Standing, MD;  Location: [facility] ORS;  Service: General;  Laterality: N/A;    Family History  Problem Relation Age of Onset   Diabetes Brother        post-op   Coronary artery disease Brother    Uterine cancer Paternal Grandmother    Diabetes Paternal Grandfather    Breast  cancer Cousin    Colon cancer Neg Hx    Ovarian cancer Neg Hx    Pancreatic cancer Neg Hx    Prostate cancer Neg Hx     Social History   Socioeconomic History   Marital status: Widowed    Spouse name: Not on file   Number of children: 2   Years of education: Not on file   Highest education level: 12th grade  Occupational History   Occupation: Retired    Associate Professor: RETIRED  Tobacco Use   Smoking status: Never   Smokeless tobacco: Never  Vaping Use   Vaping status: Never Used  Substance and Sexual Activity   Alcohol use: No   Drug use: No   Sexual activity: Not Currently  Other Topics Concern   Not on file  Social History Narrative   Left handed   Widow. Husband ([name]) deceased March 27, 2016 from dementia. She was caregiver.    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   Activity: no regular exercise   Diet: good water,  fruits/vegetables daily      Patient Care Team:   Rilla Baller, MD as PCP - General (Family Medicine)   Darliss Rogue, MD as PCP - Cardiology (Cardiology)   Landy Arvin BRAVO, RN as [facility] Care Management   Gross, Elspeth, MD as Consulting Physician (General Surgery)   Unk Corinn Skiff, MD as Consulting Physician (Gastroenterology)   Darliss Rogue, MD as Consulting Physician (Cardiology)   Social Drivers of Health   Tobacco Use: Low Risk (03/28/2024)   Patient History    Smoking Tobacco Use: Never    Smokeless Tobacco Use: Never    Passive Exposure: Not on file  Financial Resource Strain: Medium Risk (02/06/2024)   Overall Financial Resource Strain (CARDIA)    Difficulty of Paying Living Expenses: Somewhat hard  Food Insecurity: No Food Insecurity (03/16/2024)   Epic    Worried About Running Out of Food in the Last Year: Never true    Ran Out of Food in the Last Year: Never true  Transportation Needs: No Transportation Needs (03/16/2024)   Epic    Lack of Transportation (Medical): No    Lack of Transportation (Non-Medical): No  Physical Activity: Inactive (02/06/2024)   Exercise Vital Sign    Days of Exercise per Week: 0 days    Minutes of Exercise per Session: Not on file  Stress: No Stress Concern Present (02/06/2024)   [employer] of Occupational Health - Occupational Stress Questionnaire    Feeling of Stress: Only a little  Social Connections: Moderately Isolated (02/06/2024)   Social Connection and Isolation Panel    Frequency of Communication with Friends and Family: More than three times a week    Frequency of Social Gatherings with Friends and Family: Once a week    Attends Religious Services: 1 to 4 times per year    Active Member of [employer] or Organizations: No    Attends Banker Meetings: Not on file    Marital Status: Widowed  Depression (PHQ2-9): Low Risk (03/16/2024)   Depression (PHQ2-9)    PHQ-2 Score: 1   Alcohol Screen: Low Risk (07/20/2023)   Alcohol Screen    Last Alcohol Screening Score (AUDIT): 0  Housing: Low Risk (03/16/2024)   Epic    Unable to Pay for Housing in the Last Year: No    Number of Times Moved in the Last  Year: 0    Homeless in the Last Year: No  Utilities: Not At Risk (03/16/2024)   Epic    Threatened with loss of utilities: No  Health Literacy: Adequate Health Literacy (07/20/2023)   B1300 Health Literacy    Frequency of need for help with medical instructions: Never    Current Medications:  Current Outpatient Medications:    acidophilus (RISAQUAD) CAPS capsule, Take 1 capsule by mouth daily., Disp: , Rfl:    apixaban  (ELIQUIS ) 5 MG TABS tablet, Take 5 mg by mouth 2 (two) times daily., Disp: , Rfl:    Ascorbic Acid  (VITAMIN C PO), Take 1,000 mg by mouth daily., Disp: , Rfl:    bimatoprost (LUMIGAN) 0.01 % SOLN, Place 1 drop into both eyes at bedtime., Disp: , Rfl:    carvedilol  (COREG ) 6.25 MG tablet, Take 1 tablet (6.25 mg total) by mouth 2 (two) times daily with a meal., Disp: 180 tablet, Rfl: 1   cetirizine  (ZYRTEC ) 10 MG tablet, Take 1 tablet by mouth once daily (Patient taking differently: Take 10 mg by mouth daily as needed.), Disp: 90 tablet, Rfl: 1   clonazePAM  (KLONOPIN ) 0.5 MG tablet, TAKE 1 TABLET BY MOUTH AT BEDTIME, Disp: 30 tablet, Rfl: 0   colchicine  0.6 MG tablet, Take 1 tablet (0.6 mg total) by mouth daily., Disp: 90 tablet, Rfl: 1   Cyanocobalamin  (B-12) 5000 MCG CAPS, Take 5,000 mcg by mouth daily., Disp: , Rfl:    diclofenac  Sodium (VOLTAREN ) 1 % GEL, Apply 2 g topically daily as needed (pain)., Disp: , Rfl:    DULoxetine  (CYMBALTA ) 20 MG capsule, Take 1 capsule by mouth once daily, Disp: 90 capsule, Rfl: 4   EPINEPHrine  (EPIPEN  2-PAK) 0.3 mg/0.3 mL IJ SOAJ injection, Inject 0.3 mg into the muscle as needed for anaphylaxis. XOLAIR  INJECTION PROTOCOL, Disp: 2 each, Rfl: 2   famotidine  (PEPCID ) 20 MG tablet, Take 1 tablet (20 mg total) by mouth 2  (two) times daily., Disp: 180 tablet, Rfl: 3   furosemide  (LASIX ) 20 MG tablet, Take 1 tablet (20 mg total) by mouth daily. (Patient taking differently: Take 20 mg by mouth daily. Has been taking an additional 20 mg daily), Disp: 90 tablet, Rfl: 4   gabapentin  (NEURONTIN ) 300 MG capsule, TAKE 1 CAPSULE BY MOUTH ONCE DAILY AS NEEDED FOR  NERVE  PAIN  (ITCHING), Disp: 90 capsule, Rfl: 3   hydrALAZINE  (APRESOLINE ) 25 MG tablet, Take 1 tablet (25 mg total) by mouth in the morning and at bedtime., Disp: 180 tablet, Rfl: 3   levothyroxine  (SYNTHROID ) 100 MCG tablet, Take 1 tablet (100 mcg total) by mouth daily before breakfast., Disp: 90 tablet, Rfl: 3   Magnesium  250 MG CAPS, Take 1 capsule by mouth at bedtime., Disp: , Rfl:    Magnesium  Hydroxide (DULCOLAX SOFT CHEWS PO), Take 2 each by mouth daily as needed (constipation)., Disp: , Rfl:    mometasone  (ELOCON ) 0.1 % ointment, Apply pea sized amount to affected area daily as needed. Shower then ointment applied while still wet, Disp: 45 g, Rfl: 1   Multiple Vitamin (MULTIVITAMIN ADULT PO), Take 1 tablet by mouth daily., Disp: , Rfl:    norethindrone  (AYGESTIN ) 5 MG tablet, Take 2 tablets (10 mg total) by mouth daily., Disp: 60 tablet, Rfl: 2   OVER THE COUNTER MEDICATION, Take 1 tablet by mouth daily. [place] High Absorption, Disp: , Rfl:    polyethylene glycol (MIRALAX  / GLYCOLAX ) 17 g packet, Take 17 g by mouth every 3 (three) days.,  Disp: , Rfl:    sodium chloride  (MURO 128) 5 % ophthalmic solution, Place 1 drop into both eyes as needed for eye irritation., Disp: , Rfl:    spironolactone  (ALDACTONE ) 25 MG tablet, TAKE 1 AND 1/2 TABLETS DAILY, Disp: 135 tablet, Rfl: 3   timolol (TIMOPTIC) 0.5 % ophthalmic solution, Place 1 drop into both eyes every morning., Disp: , Rfl:    Vitamin D -Vitamin K (VITAMIN K2-VITAMIN D3 PO), Take 1 tablet by mouth daily., Disp: , Rfl:    zinc gluconate 50 MG tablet, Take 50 mg by mouth daily., Disp: , Rfl:   Review of  Symptoms: See interval. Only reported symptom includes itching.  Physical Exam: General: Well developed, well nourished female in no acute distress. Alert and oriented x 3.  Cardiovascular: Systolic murmur noted. Regular rate and rhythm. S1 and S2 normal.  Lungs: Clear to auscultation bilaterally. No wheezes/crackles/rhonchi noted.  Skin: Skin is thin, well moisturized. Scattered ecchymoses. Mild erythema in areas of itching, changes related to scratching. Abdomen: Abdomen soft, non-tender and obese. Active bowel sounds in all quadrants. No evidence of a fluid wave or abdominal masses.  Genitourinary:    Vulva/vagina: Normal external female genitalia. No lesions. No erythema.    Urethra: No lesions or masses    Vagina: Atrophic without any lesions. No palpable masses. No vaginal bleeding noted. Mild amount of white discharge in the vagina. Sample obtained for wet prep and aptima swab. IUD strings in place coming from os. Extremities: No bilateral cyanosis, pitting edema, or clubbing.    Laboratory & Radiologic Studies: A. ENDOMETRIAL, CURETTINGS:  - Endometrioid carcinoma, FIGO 1.  - MMR and p53 testing was performed on the prior biopsy ([id number]).   Assessment & Plan: Brooke Beasley is a 89 y.o. woman with clinical Stage I grade 1 endometrioid endometrial adenocarcinoma, MMRp. Status post hysteroscopy, D&C with Mirena  IUD placement last week. Presents to the office for evaluation of vulvar itching (chronic issue present preop) and vaginal discharge with a foul odor x 2 days post-op.  Patient is overall doing very well from a post-op standpoint. Wet prep and aptima swab obtained today to test for candidiasis and bacteria vaginosis given symptoms. She will be contacted with the results. No obvious cause for widespread itching. Patient has seen multiple specialists for this. Daughter stating they felt it may be related to an immune response. Advised to continue with follow up with her  regular providers. We will also reach out to pharmacy to see if there is a medication with an obvious side effect of itching.   Reinforced previous conversation with Dr. Viktoria in that she can continue the low-dose oral progesterone for a couple of months or stop it and restarted if she begins having spotting or bleeding.  In terms of follow-up, advised to continue with plan for appointment in February 2026 with Dr. Viktoria. We will perform an endometrial biopsy at that time.  Reportable signs and symptoms reviewed. Patient and daughter are agreeable with the above plan. She is advised to call for any needs or concerns.  20 minutes of total time was spent for this patient encounter, including preparation, counseling with the patient and coordination of care, and documentation of the encounter.  [name] NP [facility] GYN Oncology

## 2024-04-06 NOTE — Telephone Encounter (Signed)
 I spoke with Brooke Beasley (her daughter was also present for the call) to let her know that her wet prep came back negative. When her other test results come back we will let her know the results.   After speaking with the pharmacist, she reviewed Brooke Beasley med list to see what may possibly be causing her generalized itching. Lasix  was a medication that stood out as a potential cause of itching, but we can't say for certain if that is the source of her itching. The pharmacist also reccommended that she rub a small amt. of the detergent she uses on the back of her wrist to test for irritation. She may call up with any other questions or concerns

## 2024-04-06 NOTE — Patient Instructions (Addendum)
 Today we took samples of the discharge in the vagina. We will contact you with the results. The IUD was in place on exam.    I will also reach out to the pharmacy team and review your medications to see if there is anything that may be contributing to the itching.   Plan to follow up as scheduled with Dr. Viktoria in Feb 2026 or sooner if needed.  Please call for any new symptoms or needs.  Symptoms to report to your health care team include vaginal bleeding, rectal bleeding, bloating, weight loss without effort, new and persistent pain, new and  persistent fatigue, new leg swelling, new masses (i.e., bumps in your neck or groin), new and persistent cough, new and persistent nausea and vomiting, change in bowel or bladder habits, and any other concerns.

## 2024-04-09 ENCOUNTER — Telehealth: Payer: Self-pay | Admitting: *Deleted

## 2024-04-09 LAB — CERVICOVAGINAL ANCILLARY ONLY
Bacterial Vaginitis (gardnerella): NEGATIVE
Candida Glabrata: NEGATIVE
Candida Vaginitis: NEGATIVE
Comment: NEGATIVE
Comment: NEGATIVE
Comment: NEGATIVE

## 2024-04-09 NOTE — Telephone Encounter (Signed)
 Spoke with Brooke Beasley and relayed message from [name], NP that patient's recent swab is negative for BV and candida vaginitis. No intervention needed at this time from post procedure standpoint. Pt verbalized understanding and thanked the office for calling.

## 2024-04-09 NOTE — Telephone Encounter (Signed)
-----   Message from [name], NP sent at 04/09/2024  2:49 PM EST ----- Please let her know her recent swab is negative for BV and candida vaginitis. No intervention needed at this time from post procedure standpoint.

## 2024-04-17 ENCOUNTER — Other Ambulatory Visit: Payer: Self-pay | Admitting: Family Medicine

## 2024-04-17 DIAGNOSIS — F418 Other specified anxiety disorders: Secondary | ICD-10-CM

## 2024-04-18 NOTE — Telephone Encounter (Signed)
 Requesting: Klonopin   Contract: No UDS:  Last Visit: 02/17/2024 Next Visit: Visit date not found Last Refill: 03/26/24  Please Advise

## 2024-04-21 NOTE — Telephone Encounter (Signed)
 ERx

## 2024-04-25 ENCOUNTER — Encounter: Payer: Self-pay | Admitting: Family Medicine

## 2024-04-25 ENCOUNTER — Ambulatory Visit: Admitting: Family Medicine

## 2024-04-25 ENCOUNTER — Ambulatory Visit: Payer: Self-pay | Admitting: Family Medicine

## 2024-04-25 VITALS — BP 130/84 | HR 58 | Temp 97.6°F | Ht 60.0 in | Wt 180.4 lb

## 2024-04-25 DIAGNOSIS — R5383 Other fatigue: Secondary | ICD-10-CM | POA: Diagnosis not present

## 2024-04-25 DIAGNOSIS — C541 Malignant neoplasm of endometrium: Secondary | ICD-10-CM | POA: Diagnosis not present

## 2024-04-25 DIAGNOSIS — I4892 Unspecified atrial flutter: Secondary | ICD-10-CM

## 2024-04-25 DIAGNOSIS — D6869 Other thrombophilia: Secondary | ICD-10-CM

## 2024-04-25 DIAGNOSIS — R2681 Unsteadiness on feet: Secondary | ICD-10-CM | POA: Diagnosis not present

## 2024-04-25 DIAGNOSIS — F418 Other specified anxiety disorders: Secondary | ICD-10-CM

## 2024-04-25 DIAGNOSIS — N1832 Chronic kidney disease, stage 3b: Secondary | ICD-10-CM | POA: Diagnosis not present

## 2024-04-25 DIAGNOSIS — E039 Hypothyroidism, unspecified: Secondary | ICD-10-CM | POA: Diagnosis not present

## 2024-04-25 DIAGNOSIS — R5381 Other malaise: Secondary | ICD-10-CM | POA: Diagnosis not present

## 2024-04-25 DIAGNOSIS — I4819 Other persistent atrial fibrillation: Secondary | ICD-10-CM

## 2024-04-25 LAB — CBC WITH DIFFERENTIAL/PLATELET
Basophils Absolute: 0 K/uL (ref 0.0–0.1)
Basophils Relative: 0.5 % (ref 0.0–3.0)
Eosinophils Absolute: 0.3 K/uL (ref 0.0–0.7)
Eosinophils Relative: 4.6 % (ref 0.0–5.0)
HCT: 47.2 % — ABNORMAL HIGH (ref 36.0–46.0)
Hemoglobin: 15.9 g/dL — ABNORMAL HIGH (ref 12.0–15.0)
Lymphocytes Relative: 27.2 % (ref 12.0–46.0)
Lymphs Abs: 1.7 K/uL (ref 0.7–4.0)
MCHC: 33.8 g/dL (ref 30.0–36.0)
MCV: 97.6 fl (ref 78.0–100.0)
Monocytes Absolute: 0.9 K/uL (ref 0.1–1.0)
Monocytes Relative: 14.1 % — ABNORMAL HIGH (ref 3.0–12.0)
Neutro Abs: 3.3 K/uL (ref 1.4–7.7)
Neutrophils Relative %: 53.6 % (ref 43.0–77.0)
Platelets: 208 K/uL (ref 150.0–400.0)
RBC: 4.84 Mil/uL (ref 3.87–5.11)
RDW: 14.9 % (ref 11.5–15.5)
WBC: 6.2 K/uL (ref 4.0–10.5)

## 2024-04-25 LAB — COMPREHENSIVE METABOLIC PANEL WITH GFR
ALT: 30 U/L (ref 3–35)
AST: 31 U/L (ref 5–37)
Albumin: 4.1 g/dL (ref 3.5–5.2)
Alkaline Phosphatase: 78 U/L (ref 39–117)
BUN: 25 mg/dL — ABNORMAL HIGH (ref 6–23)
CO2: 32 meq/L (ref 19–32)
Calcium: 9.7 mg/dL (ref 8.4–10.5)
Chloride: 97 meq/L (ref 96–112)
Creatinine, Ser: 1.3 mg/dL — ABNORMAL HIGH (ref 0.40–1.20)
GFR: 35.97 mL/min — ABNORMAL LOW
Glucose, Bld: 106 mg/dL — ABNORMAL HIGH (ref 70–99)
Potassium: 4.4 meq/L (ref 3.5–5.1)
Sodium: 135 meq/L (ref 135–145)
Total Bilirubin: 0.9 mg/dL (ref 0.2–1.2)
Total Protein: 6.7 g/dL (ref 6.0–8.3)

## 2024-04-25 LAB — POC URINALSYSI DIPSTICK (AUTOMATED)
Bilirubin, UA: NEGATIVE
Blood, UA: NEGATIVE
Glucose, UA: NEGATIVE
Ketones, UA: NEGATIVE
Leukocytes, UA: NEGATIVE
Nitrite, UA: NEGATIVE
Protein, UA: NEGATIVE
Spec Grav, UA: 1.01
Urobilinogen, UA: 0.2 U/dL
pH, UA: 7

## 2024-04-25 LAB — TSH: TSH: 1.6 u[IU]/mL (ref 0.35–5.50)

## 2024-04-25 MED ORDER — DULOXETINE HCL 30 MG PO CPEP: 30.0000 mg | ORAL_CAPSULE | Freq: Every day | ORAL | 2 refills | Status: AC

## 2024-04-25 NOTE — Assessment & Plan Note (Signed)
 Acute worsening associated with HA, nausea, multiple other symptoms - all worse since Mirena  IUD placement - symptoms could be related to this. Update labs  today including UA. Recommend further discuss with GYN ONC.

## 2024-04-25 NOTE — Assessment & Plan Note (Signed)
 Appreciate gyn onc care  S/p hysteroscopy with D&C.  Planned f/u 05/2024

## 2024-04-25 NOTE — Assessment & Plan Note (Signed)
 Increase noted recently - without recnet falls Offered outpatient PT for balance training/fall prevention - pt will let me know if decides to pursue this

## 2024-04-25 NOTE — Assessment & Plan Note (Addendum)
 Chronic, recently worsening after progesterone IUD placement  Discussed option to increase cymbalta - will increase to 30mg  daily and monitor effect.

## 2024-04-25 NOTE — Assessment & Plan Note (Addendum)
 Update TSH levels on levothyroxine  100mcg daily.

## 2024-04-25 NOTE — Assessment & Plan Note (Signed)
 Update renal function.

## 2024-04-25 NOTE — Patient Instructions (Addendum)
 Labs today.  Urine test today.  Let me know if you'd like to see physical therapist for balance training and fall prevention program.  We will be in touch with results.

## 2024-04-25 NOTE — Assessment & Plan Note (Signed)
 Notes increasing bruising Update Cr and if >1.5, drop eliquis  to 2.5mg  bid dosing

## 2024-04-25 NOTE — Progress Notes (Signed)
 "       Ph: [phone] Fax: [phone]   Patient ID: Lauraine Kennth Rummer, female    DOB: [birth year], 89 y.o.   MRN: [id number]  This visit was conducted in person.  BP 130/84 (Cuff Size: Normal)   Pulse (!) 58   Temp 97.6 F (36.4 C) (Oral)   Ht 5' (1.524 m)   Wt 180 lb 6.4 oz (81.8 kg)   SpO2 97%   BMI 35.23 kg/m   BP Readings from Last 3 Encounters:  04/25/24 130/84  04/06/24 131/65  03/21/24 (!) 155/85    CC: malaise, worse since IUD placed for uterine cancer  Subjective:   HPI: Luann Aspinwall is a 89 y.o. female presenting on 04/25/2024 for Acute Visit (Stomach pain, no appetite, nausea, head pressure, anxiety and depression /Pt started feeling this way since having IUD placed for uterine cancer//Pt's Daughter, Ms [name] is in room with pt)   Recent diagnosis grade 1 endometrial adenocarcinoma s/p hysteroscopy and D&C followed by Mirena  IUD placement early 03/2024. Also using oral progesterone as needed.   Since IUD was placed, she notes head pressure sensation, nausea, lower abdominal discomfort, and fatigue. Notes vaginal odor - recent wet prep normal through GYN/ONC. Notes increased depression/anxiety, increasing unsteadiness. Some heat intolerance. Chronic urinary urgency.  No fevers/chills, diarrhea, vomiting. No chest pain or dyspnea. No palpitations. No dysuria, frequency. No significant respiratory symptoms.   Mirena  IUD has nausea, abd pain, hedache listed as possible side effects (>10%).   She requests repeat labs specifically sodium levels in h/o hyponatremia.  Daughter requests HHPT eval for increased instability. She regularly uses cane. Pt declines PT at this time.      Relevant past medical, surgical, family and social history reviewed and updated as indicated. Interim medical history since our last visit reviewed. Allergies and medications reviewed and updated. Outpatient Medications Prior to Visit  Medication Sig Dispense Refill    acidophilus (RISAQUAD) CAPS capsule Take 1 capsule by mouth daily.     apixaban  (ELIQUIS ) 5 MG TABS tablet Take 5 mg by mouth 2 (two) times daily.     Ascorbic Acid  (VITAMIN C PO) Take 1,000 mg by mouth daily.     bimatoprost (LUMIGAN) 0.01 % SOLN Place 1 drop into both eyes at bedtime.     carvedilol  (COREG ) 6.25 MG tablet Take 1 tablet (6.25 mg total) by mouth 2 (two) times daily with a meal. 180 tablet 1   cetirizine  (ZYRTEC ) 10 MG tablet Take 1 tablet by mouth once daily (Patient taking differently: Take 10 mg by mouth daily as needed.) 90 tablet 1   clonazePAM  (KLONOPIN ) 0.5 MG tablet TAKE 1 TABLET BY MOUTH AT BEDTIME 30 tablet 0   colchicine  0.6 MG tablet Take 1 tablet (0.6 mg total) by mouth daily. 90 tablet 1   Cyanocobalamin  (B-12) 5000 MCG CAPS Take 5,000 mcg by mouth daily.     diclofenac  Sodium (VOLTAREN ) 1 % GEL Apply 2 g topically daily as needed (pain).     EPINEPHrine  (EPIPEN  2-PAK) 0.3 mg/0.3 mL IJ SOAJ injection Inject 0.3 mg into the muscle as needed for anaphylaxis. XOLAIR  INJECTION PROTOCOL 2 each 2   famotidine  (PEPCID ) 20 MG tablet Take 1 tablet (20 mg total) by mouth 2 (two) times daily. 180 tablet 3   furosemide  (LASIX ) 20 MG tablet Take 1 tablet (20 mg total) by mouth daily. (Patient taking differently: Take 20 mg by mouth daily. Takes additional 20 mg daily as needed) 90 tablet  4   gabapentin  (NEURONTIN ) 300 MG capsule TAKE 1 CAPSULE BY MOUTH ONCE DAILY AS NEEDED FOR  NERVE  PAIN  (ITCHING) 90 capsule 3   hydrALAZINE  (APRESOLINE ) 25 MG tablet Take 1 tablet (25 mg total) by mouth in the morning and at bedtime. 180 tablet 3   levothyroxine  (SYNTHROID ) 100 MCG tablet Take 1 tablet (100 mcg total) by mouth daily before breakfast. 90 tablet 3   Magnesium  250 MG CAPS Take 1 capsule by mouth at bedtime.     Magnesium  Hydroxide (DULCOLAX SOFT CHEWS PO) Take 2 each by mouth daily as needed (constipation).     mometasone  (ELOCON ) 0.1 % ointment Apply pea sized amount to affected  area daily as needed. Shower then ointment applied while still wet 45 g 1   Multiple Vitamin (MULTIVITAMIN ADULT PO) Take 1 tablet by mouth daily.     OVER THE COUNTER MEDICATION Take 1 tablet by mouth daily. Blueberry High Absorption     polyethylene glycol (MIRALAX  / GLYCOLAX ) 17 g packet Take 17 g by mouth every 3 (three) days.     sodium chloride  (MURO 128) 5 % ophthalmic solution Place 1 drop into both eyes as needed for eye irritation.     spironolactone  (ALDACTONE ) 25 MG tablet TAKE 1 AND 1/2 TABLETS DAILY 135 tablet 3   timolol (TIMOPTIC) 0.5 % ophthalmic solution Place 1 drop into both eyes every morning.     Vitamin D -Vitamin K (VITAMIN K2-VITAMIN D3 PO) Take 1 tablet by mouth daily.     zinc gluconate 50 MG tablet Take 50 mg by mouth daily.     DULoxetine  (CYMBALTA ) 20 MG capsule Take 1 capsule by mouth once daily 90 capsule 4   norethindrone  (AYGESTIN ) 5 MG tablet Take 2 tablets (10 mg total) by mouth daily. (Patient not taking: Reported on 04/25/2024) 60 tablet 2   No facility-administered medications prior to visit.     Per HPI unless specifically indicated in ROS section below Review of Systems  Hematological:  Bruises/bleeds easily.    Objective:  BP 130/84 (Cuff Size: Normal)   Pulse (!) 58   Temp 97.6 F (36.4 C) (Oral)   Ht 5' (1.524 m)   Wt 180 lb 6.4 oz (81.8 kg)   SpO2 97%   BMI 35.23 kg/m   Wt Readings from Last 3 Encounters:  04/25/24 180 lb 6.4 oz (81.8 kg)  03/21/24 180 lb 12.4 oz (82 kg)  03/20/24 181 lb (82.1 kg)      Physical Exam Vitals and nursing note reviewed.  Constitutional:      Appearance: Normal appearance. She is not ill-appearing.  HENT:     Mouth/Throat:     Mouth: Mucous membranes are moist.     Pharynx: Oropharynx is clear. No oropharyngeal exudate or posterior oropharyngeal erythema.  Eyes:     Extraocular Movements: Extraocular movements intact.     Pupils: Pupils are equal, round, and reactive to light.  Cardiovascular:      Rate and Rhythm: Normal rate and regular rhythm.     Pulses: Normal pulses.     Heart sounds: Murmur (3/6 systolic USB) heard.  Pulmonary:     Effort: Pulmonary effort is normal. No respiratory distress.     Breath sounds: Normal breath sounds. No wheezing, rhonchi or rales.  Abdominal:     General: Bowel sounds are normal. There is no distension.     Palpations: Abdomen is soft. There is no mass.     Tenderness: There is  no abdominal tenderness. There is no guarding or rebound.     Hernia: No hernia is present.  Musculoskeletal:     Right lower leg: No edema.     Left lower leg: No edema.  Skin:    General: Skin is warm and dry.     Findings: Bruising (multiple sites) present. No rash.  Neurological:     Mental Status: She is alert.     Coordination: Coordination is intact. Romberg sign negative.     Comments:  5/5 BLE strength   Psychiatric:        Mood and Affect: Mood normal.        Behavior: Behavior normal.       Results for orders placed or performed in visit on 04/25/24  POCT Urinalysis Dipstick (Automated)   Collection Time: 04/25/24  8:47 AM  Result Value Ref Range   Color, UA Yellow    Clarity, UA Clear    Glucose, UA Negative Negative   Bilirubin, UA Negative    Ketones, UA Negative    Spec Grav, UA 1.010 1.010 - 1.025   Blood, UA Negative    pH, UA 7.0 5.0 - 8.0   Protein, UA Negative Negative   Urobilinogen, UA 0.2 0.2 or 1.0 E.U./dL   Nitrite, UA Negative    Leukocytes, UA Negative Negative   *Note: Due to a large number of results and/or encounters for the requested time period, some results have not been displayed. A complete set of results can be found in Results Review.    Assessment & Plan:   Problem List Items Addressed This Visit     Hypothyroidism   Update TSH levels on levothyroxine  100mcg daily.       Relevant Orders   TSH   Anxiety associated with depression   Chronic, recently worsening after progesterone IUD placement  Discussed  option to increase cymbalta - will increase to 30mg  daily and monitor effect.       Relevant Medications   DULoxetine  (CYMBALTA ) 30 MG capsule   Stage 3b chronic kidney disease (HCC)   Update renal function      Malaise and fatigue - Primary   Acute worsening associated with HA, nausea, multiple other symptoms - all worse since Mirena  IUD placement - symptoms could be related to this. Update labs  today including UA. Recommend further discuss with GYN ONC.       Relevant Orders   Comprehensive metabolic panel with GFR   CBC with Differential/Platelet   TSH   POCT Urinalysis Dipstick (Automated) (Completed)   Atrial flutter (HCC)   Hypercoagulable state due to persistent atrial fibrillation (HCC)   Notes increasing bruising Update Cr and if >1.5, drop eliquis  to 2.5mg  bid dosing      Endometrial cancer determined by uterine biopsy [facility])   Appreciate gyn onc care  S/p hysteroscopy with D&C.  Planned f/u 05/2024      Unsteadiness on feet   Increase noted recently - without recnet falls Offered outpatient PT for balance training/fall prevention - pt will let me know if decides to pursue this         Meds ordered this encounter  Medications   DULoxetine  (CYMBALTA ) 30 MG capsule    Sig: Take 1 capsule (30 mg total) by mouth daily.    Dispense:  90 capsule    Refill:  2    Note new dose    Orders Placed This Encounter  Procedures   Comprehensive metabolic panel with GFR  CBC with Differential/Platelet   TSH   POCT Urinalysis Dipstick (Automated)    Patient Instructions  Labs today.  Urine test today.  Let me know if you'd like to see physical therapist for balance training and fall prevention program.  We will be in touch with results.   Follow up plan: Return in about 4 months (around 08/23/2024), or if symptoms worsen or fail to improve, for annual exam, prior fasting for blood work, medicare wellness visit.  Anton Blas, MD   "

## 2024-05-03 ENCOUNTER — Encounter: Payer: Self-pay | Admitting: Sleep Medicine

## 2024-05-03 ENCOUNTER — Ambulatory Visit: Admitting: Sleep Medicine

## 2024-05-03 ENCOUNTER — Other Ambulatory Visit: Payer: Self-pay | Admitting: Family Medicine

## 2024-05-03 VITALS — BP 120/80 | HR 76 | Temp 98.2°F | Ht 60.0 in | Wt 183.2 lb

## 2024-05-03 DIAGNOSIS — I4891 Unspecified atrial fibrillation: Secondary | ICD-10-CM | POA: Diagnosis not present

## 2024-05-03 DIAGNOSIS — G4739 Other sleep apnea: Secondary | ICD-10-CM

## 2024-05-03 DIAGNOSIS — E039 Hypothyroidism, unspecified: Secondary | ICD-10-CM | POA: Diagnosis not present

## 2024-05-03 NOTE — Progress Notes (Signed)
 "      Name:Brooke Beasley MRN: [id number] DOB: 07-22-1930   CHIEF COMPLAINT:  PAP F/U   HISTORY OF PRESENT ILLNESS:  Brooke Beasley is a 89 y.o. w/ a h/o complex sleep apnea, atrial fibrillation and hypothyroidism who presents PAP follow up visit. Patient is currently on ASV PAP therapy, reports using PAP nightly which is confirmed by compliance data. Reports not feeling as refreshed upon awakening with PAP therapy. She is currently using the Airtouch F20 FFM, which is uncomfortable and causes skin irritation.    EPWORTH SLEEP SCORE    11/13/2021   11:00 AM  Results of the Epworth flowsheet  Sitting and reading 3  Watching TV 2  Sitting, inactive in a public place (e.g. a theatre or a meeting) 0  As a passenger in a car for an hour without a break 1  Lying down to rest in the afternoon when circumstances permit 3  Sitting and talking to someone 0  Sitting quietly after a lunch without alcohol 2  In a car, while stopped for a few minutes in traffic 0  Total score 11    PAST MEDICAL HISTORY :   has a past medical history of A-fib (HCC), Allergy  (1965), Anxiety (1965), Arrhythmia, Basal cell carcinoma, arm, left (02/2023), C. difficile diarrhea (07/11/2018), CAP (community acquired pneumonia) (12/22/2017), Carotid stenosis (10/18/2014), CKD (chronic kidney disease) stage 3, GFR 30-59 ml/min (HCC) (08/02/2014), Closed fracture of right orbit (HCC) (02/10/2021), Clostridioides difficile infection, COVID-19 virus infection (10/13/2020), Degenerative disc disease, Depression (1965), Diverticulosis, Dyspnea, Endometrial cancer (HCC), Family history of adverse reaction to anesthesia, GERD (gastroesophageal reflux disease) (1980), Glaucoma, Heart murmur (5/13), History of hiatal hernia, History of shingles, HTN (hypertension), Hyperlipidemia, Hypertension, Hypothyroidism, IBS (11/02/2006), Influenza A (04/20/2022), Intestinal bacterial overgrowth, Left shoulder pain (11/04/2014), Lower GI  bleed (12/2020), Maxillary fracture (HCC) (04/08/2012), Osteoarthritis (2016), Osteoporosis, Perirectal abscess (08/03/2021), Pseudogout (2016), Sleep apnea, Strep pharyngitis (04/20/2022), and Uterine cancer (HCC).  has a past surgical history that includes Tonsillectomy; Bowel obstruction (1999); Colonoscopy (1. 1999  2. 11/04); Dexa (1. 1997-1998   2. 9/04  3. 3/08 ); Esophagogastroduodenoscopy; Hemorrhoid procedure (07/2006); DG KNEE 1-2 VIEWS BILAT; US  ECHOCARDIOGRAPHY (07/2011); barium enema (2012); Total shoulder arthroplasty (Left, 11/27/2015); Total shoulder revision (Left, 03/16/2016); Colonoscopy with propofol  (N/A, 09/10/2019); Flexible sigmoidoscopy (N/A, 01/16/2021); XI robotic assisted lower anterior resection (N/A, 05/26/2021); Rectopexy (N/A, 05/26/2021); Proctoscopy (N/A, 05/26/2021); TEE without cardioversion (N/A, 07/27/2021); Bubble study (07/27/2021); PICC LINE REMOVAL ([facility] HX) (08/28/2021); Cardioversion (N/A, 11/19/2021); Joint replacement; Dilatation & currettage/hysteroscopy with resectoscope (N/A, 03/21/2024); Myosure resection (N/A, 03/21/2024); and Intrauterine device (iud) insertion (N/A, 03/21/2024). Prior to Admission medications   Medication Sig Start Date End Date Taking? Authorizing Provider  apixaban  (ELIQUIS ) 5 MG TABS tablet Take 1 tablet (5 mg total) by mouth 2 (two) times daily. 08/22/23  Yes Rilla Baller, MD  Ascorbic Acid  (VITAMIN C PO) Take by mouth.   Yes [provider]  bimatoprost (LUMIGAN) 0.01 % SOLN Place 1 drop into both eyes at bedtime.   Yes [provider]  carvedilol  (COREG ) 6.25 MG tablet Take 1 tablet (6.25 mg total) by mouth 2 (two) times daily with a meal. 10/31/23  Yes Walker, Caitlin S, NP  cetirizine  (ZYRTEC ) 10 MG tablet Take 1 tablet by mouth once daily 09/26/23  Yes Kozlow, Eric J, MD  cholecalciferol  (VITAMIN D ) 1000 units tablet Take 1,000 Units by mouth daily.   Yes [provider]  clonazePAM  (KLONOPIN ) 0.5 MG  tablet TAKE 1 TABLET  BY MOUTH AT BEDTIME 08/24/23  Yes Rilla Baller, MD  colchicine  0.6 MG tablet Take 1 tablet (0.6 mg total) by mouth daily. 12/10/21  Yes Dugal, Ginger, FNP  diclofenac  Sodium (VOLTAREN ) 1 % GEL Apply 2 g topically 4 (four) times daily.   Yes [provider]  DULoxetine  (CYMBALTA ) 20 MG capsule Take 1 capsule by mouth once daily 09/26/23  Yes Rilla Baller, MD  EPINEPHrine  (EPIPEN  2-PAK) 0.3 mg/0.3 mL IJ SOAJ injection Inject 0.3 mg into the muscle as needed for anaphylaxis. XOLAIR  INJECTION PROTOCOL 04/09/22  Yes Lorin Norris, MD  famotidine  (PEPCID ) 20 MG tablet Take 1 tablet (20 mg total) by mouth 2 (two) times daily. 04/22/23  Yes Rilla Baller, MD  furosemide  (LASIX ) 20 MG tablet Take 1 tablet (20 mg total) by mouth daily. 08/22/23  Yes Rilla Baller, MD  gabapentin  (NEURONTIN ) 300 MG capsule TAKE 1 CAPSULE BY MOUTH ONCE DAILY AS NEEDED FOR  NERVE  PAIN  (ITCHING) 04/22/23  Yes Rilla Baller, MD  hydrALAZINE  (APRESOLINE ) 25 MG tablet Take 1 tablet (25 mg total) by mouth in the morning and at bedtime. 07/18/23  Yes Raford Riggs, MD  levothyroxine  (SYNTHROID ) 100 MCG tablet Take 100 mcg by mouth daily before breakfast. 09/26/23  Yes [provider]  levothyroxine  (SYNTHROID ) 88 MCG tablet Take 88 mcg by mouth daily. 09/27/23  Yes [provider]  Magnesium  250 MG CAPS Take 1 capsule by mouth at bedtime. 08/22/23  Yes Rilla Baller, MD  mometasone  (ELOCON ) 0.1 % ointment Apply topically daily. Shower than ointment applied while still wet daily 10/05/23  Yes Rilla Baller, MD  Multiple Vitamin (MULTIVITAMIN ADULT PO) Take by mouth daily.   Yes [provider]  polyethylene glycol (MIRALAX  / GLYCOLAX ) 17 g packet Take 17 g by mouth daily.   Yes [provider]  Simethicone  180 MG CAPS Take 1 capsule by mouth daily as needed (indigestion).   Yes [provider]  spironolactone  (ALDACTONE ) 25 MG tablet  TAKE 1 AND 1/2 TABLETS DAILY 05/23/23  Yes Raford Riggs, MD  timolol (TIMOPTIC) 0.5 % ophthalmic solution Place 1 drop into both eyes every morning. 05/28/22  Yes [provider]   Allergies  Allergen Reactions   Ace Inhibitors Cough   Amlodipine  Nausea Only and Other (See Comments)    Stomach pains--feel like my insides are on fire   Carvedilol  Other (See Comments)    fatigue   Chlorthalidone  Other (See Comments)    Severe hyponatremia   Montelukast  Other (See Comments)    Slurred speech; really drowsy   Other Other (See Comments)    No seeds or corn because of IBS   Risedronate Sodium Other (See Comments)    REACTION: joint pain   Simvastatin  Other (See Comments)    REACTION: the full 20 mg pill causes leg pain- can tol 10 mg   Prolia  [Denosumab ] Other (See Comments)    facial swelling, throat tightness, right greater than left foot swelling, right great toe numbness   Alendronate Sodium Palpitations   Influenza Vaccines Rash and Other (See Comments)    Led to mental breakdown in 1960s    Losartan  Other (See Comments)    Cr bumped   Paroxetine Hcl Other (See Comments)    Not effective - felt ill on this medicine   Silenor  [Doxepin  Hcl] Palpitations    Hallucinations and racing heart   Sulfonamide Derivatives Rash   Toprol  Xl [Metoprolol ] Diarrhea    Diarrhea and weakness  FAMILY HISTORY:  family history includes Breast cancer in her cousin; Coronary artery disease in her brother; Diabetes in her brother and paternal grandfather; Uterine cancer in her paternal grandmother. SOCIAL HISTORY:  reports that she has never smoked. She has never used smokeless tobacco. She reports that she does not drink alcohol and does not use drugs.   Review of Systems:  Gen:  Denies  fever, sweats, chills weight loss  HEENT: Denies blurred vision, double vision, ear pain, eye pain, hearing loss, nose bleeds, sore throat Cardiac:  No dizziness, chest pain or heaviness, chest  tightness,edema, No JVD Resp:   No cough, -sputum production, -shortness of breath,-wheezing, -hemoptysis,  Gi: Denies swallowing difficulty, stomach pain, nausea or vomiting, diarrhea, constipation, bowel incontinence Gu:  Denies bladder incontinence, burning urine Ext:   Denies Joint pain, stiffness or swelling Skin: Denies  skin rash, easy bruising or bleeding or hives Endoc:  Denies polyuria, polydipsia , polyphagia or weight change Psych:   Denies depression, insomnia or hallucinations  Other:  All other systems negative  VITAL SIGNS: BP 120/80   Pulse 76   Temp 98.2 F (36.8 C)   Ht 5' (1.524 m)   Wt 183 lb 3.2 oz (83.1 kg)   SpO2 93%   BMI 35.78 kg/m    Physical Examination:   General Appearance: No distress  EYES PERRLA, EOM intact.   NECK Supple, No JVD Pulmonary: normal breath sounds, No wheezing.  CardiovascularNormal S1,S2.  No m/r/g.   Abdomen: Benign, Soft, non-tender. Skin:   warm, no rashes, no ecchymosis  Extremities: normal, no cyanosis, clubbing. Neuro:without focal findings,  speech normal  PSYCHIATRIC: Mood, affect within normal limits.   ASSESSMENT AND PLAN  Complex sleep anea Due to significant residual apnea, will make pressure changes and check compliance in 1 week.  Will also try patient on the Airtouch F30i FFM. Discussed the consequences of untreated sleep apnea. Advised not to drive drowsy for safety of patient and others. Will follow up in 3 months.   Atrial fibrillation Stable, on current management. Following with cardiology.    Hypothyroidism Stable, on current management.    Patient  satisfied with Plan of action and management. All questions answered  Follow up to review sleep study results and treatment plan.   I spent a total of 31 minutes reviewing chart data, face-to-face evaluation with the patient, counseling and coordination of care as detailed above.    Kacy Hegna, M.D.  Sleep Medicine [facility]        "

## 2024-05-03 NOTE — Patient Instructions (Signed)

## 2024-05-08 ENCOUNTER — Encounter: Payer: Self-pay | Admitting: Sleep Medicine

## 2024-05-17 NOTE — Addendum Note (Signed)
 Addended by: Asher Babilonia on: 05/17/2024 12:44 PM   Modules accepted: Level of Service

## 2024-05-18 ENCOUNTER — Other Ambulatory Visit: Payer: Self-pay | Admitting: Family Medicine

## 2024-05-18 ENCOUNTER — Other Ambulatory Visit (HOSPITAL_BASED_OUTPATIENT_CLINIC_OR_DEPARTMENT_OTHER): Payer: Self-pay | Admitting: Cardiovascular Disease

## 2024-05-18 DIAGNOSIS — F418 Other specified anxiety disorders: Secondary | ICD-10-CM

## 2024-05-18 NOTE — Telephone Encounter (Signed)
 Name of Medication:  Clonazepam  Name of Pharmacy:  [facility] Last Fill or Written Date and Quantity:  04/21/24, #30 Last Office Visit and Type:  04/25/24, loss of appetite; nausea Next Office Visit and Type:  08/28/24, annual exam Last Controlled Substance Agreement Date:  09/26/14 Last UDS:  09/26/14

## 2024-05-19 NOTE — Telephone Encounter (Signed)
 ERx

## 2024-06-21 ENCOUNTER — Inpatient Hospital Stay: Admitting: Gynecologic Oncology

## 2024-07-23 ENCOUNTER — Ambulatory Visit

## 2024-08-01 ENCOUNTER — Ambulatory Visit: Admitting: Sleep Medicine

## 2024-08-21 ENCOUNTER — Other Ambulatory Visit

## 2024-08-28 ENCOUNTER — Encounter: Admitting: Family Medicine
